# Patient Record
Sex: Male | Born: 1940 | Race: White | Hispanic: No | Marital: Married | Smoking: Former smoker
Health system: Southern US, Community
[De-identification: ages and names within clinical notes are randomized; demographics above are authoritative.]

## PROBLEM LIST (undated history)

## (undated) DIAGNOSIS — I10 Essential (primary) hypertension: Secondary | ICD-10-CM

## (undated) DIAGNOSIS — J449 Chronic obstructive pulmonary disease, unspecified: Secondary | ICD-10-CM

## (undated) DIAGNOSIS — I480 Paroxysmal atrial fibrillation: Secondary | ICD-10-CM

## (undated) DIAGNOSIS — I739 Peripheral vascular disease, unspecified: Secondary | ICD-10-CM

## (undated) DIAGNOSIS — N189 Chronic kidney disease, unspecified: Secondary | ICD-10-CM

## (undated) DIAGNOSIS — Z860100 Personal history of colon polyps, unspecified: Secondary | ICD-10-CM

## (undated) DIAGNOSIS — M4800 Spinal stenosis, site unspecified: Secondary | ICD-10-CM

## (undated) DIAGNOSIS — F419 Anxiety disorder, unspecified: Secondary | ICD-10-CM

## (undated) DIAGNOSIS — F32A Depression, unspecified: Secondary | ICD-10-CM

## (undated) DIAGNOSIS — G629 Polyneuropathy, unspecified: Secondary | ICD-10-CM

## (undated) DIAGNOSIS — I499 Cardiac arrhythmia, unspecified: Secondary | ICD-10-CM

## (undated) DIAGNOSIS — E785 Hyperlipidemia, unspecified: Secondary | ICD-10-CM

## (undated) DIAGNOSIS — G4730 Sleep apnea, unspecified: Secondary | ICD-10-CM

## (undated) DIAGNOSIS — I6529 Occlusion and stenosis of unspecified carotid artery: Secondary | ICD-10-CM

## (undated) DIAGNOSIS — I639 Cerebral infarction, unspecified: Secondary | ICD-10-CM

## (undated) DIAGNOSIS — F329 Major depressive disorder, single episode, unspecified: Secondary | ICD-10-CM

## (undated) DIAGNOSIS — R351 Nocturia: Secondary | ICD-10-CM

## (undated) HISTORY — PX: TONSILLECTOMY: SUR1361

## (undated) HISTORY — PX: EYE SURGERY: SHX253

## (undated) HISTORY — DX: Sleep apnea, unspecified: G47.30

## (undated) HISTORY — DX: Occlusion and stenosis of unspecified carotid artery: I65.29

---

## 1999-05-03 ENCOUNTER — Ambulatory Visit (HOSPITAL_COMMUNITY): Admission: RE | Admit: 1999-05-03 | Discharge: 1999-05-03 | Payer: Self-pay | Admitting: Gastroenterology

## 2002-03-28 HISTORY — PX: HAMMER TOE SURGERY: SHX385

## 2004-03-28 HISTORY — PX: HERNIA REPAIR: SHX51

## 2004-05-17 ENCOUNTER — Ambulatory Visit (HOSPITAL_COMMUNITY): Admission: RE | Admit: 2004-05-17 | Discharge: 2004-05-17 | Payer: Self-pay | Admitting: Gastroenterology

## 2008-03-28 HISTORY — PX: SHOULDER ARTHROSCOPY W/ ROTATOR CUFF REPAIR: SHX2400

## 2011-05-01 DIAGNOSIS — I639 Cerebral infarction, unspecified: Secondary | ICD-10-CM

## 2011-05-01 HISTORY — DX: Cerebral infarction, unspecified: I63.9

## 2011-10-21 ENCOUNTER — Other Ambulatory Visit: Payer: Self-pay | Admitting: Gastroenterology

## 2011-11-29 ENCOUNTER — Inpatient Hospital Stay (HOSPITAL_COMMUNITY): Payer: Medicare Other

## 2011-11-29 ENCOUNTER — Encounter (HOSPITAL_COMMUNITY): Payer: Self-pay | Admitting: Emergency Medicine

## 2011-11-29 ENCOUNTER — Inpatient Hospital Stay (HOSPITAL_COMMUNITY)
Admission: EM | Admit: 2011-11-29 | Discharge: 2011-12-01 | DRG: 065 | Disposition: A | Discharge status: Home / Self Care | Payer: Medicare Other | Attending: Internal Medicine | Admitting: Internal Medicine

## 2011-11-29 DIAGNOSIS — Z7982 Long term (current) use of aspirin: Secondary | ICD-10-CM

## 2011-11-29 DIAGNOSIS — F1010 Alcohol abuse, uncomplicated: Secondary | ICD-10-CM | POA: Diagnosis present

## 2011-11-29 DIAGNOSIS — I63239 Cerebral infarction due to unspecified occlusion or stenosis of unspecified carotid arteries: Principal | ICD-10-CM | POA: Diagnosis present

## 2011-11-29 DIAGNOSIS — I639 Cerebral infarction, unspecified: Secondary | ICD-10-CM | POA: Diagnosis present

## 2011-11-29 DIAGNOSIS — I10 Essential (primary) hypertension: Secondary | ICD-10-CM | POA: Diagnosis present

## 2011-11-29 DIAGNOSIS — I6529 Occlusion and stenosis of unspecified carotid artery: Secondary | ICD-10-CM | POA: Diagnosis present

## 2011-11-29 DIAGNOSIS — F1090 Alcohol use, unspecified, uncomplicated: Secondary | ICD-10-CM | POA: Diagnosis present

## 2011-11-29 DIAGNOSIS — I6350 Cerebral infarction due to unspecified occlusion or stenosis of unspecified cerebral artery: Secondary | ICD-10-CM

## 2011-11-29 DIAGNOSIS — Z79899 Other long term (current) drug therapy: Secondary | ICD-10-CM

## 2011-11-29 DIAGNOSIS — E782 Mixed hyperlipidemia: Secondary | ICD-10-CM | POA: Diagnosis present

## 2011-11-29 DIAGNOSIS — E785 Hyperlipidemia, unspecified: Secondary | ICD-10-CM

## 2011-11-29 DIAGNOSIS — Z7289 Other problems related to lifestyle: Secondary | ICD-10-CM

## 2011-11-29 HISTORY — DX: Hyperlipidemia, unspecified: E78.5

## 2011-11-29 LAB — CBC WITH DIFFERENTIAL/PLATELET
Basophils Relative: 1 % (ref 0–1)
Hemoglobin: 16.9 g/dL (ref 13.0–17.0)
MCHC: 36 g/dL (ref 30.0–36.0)
Monocytes Relative: 8 % (ref 3–12)
Neutro Abs: 4.9 10*3/uL (ref 1.7–7.7)
Neutrophils Relative %: 63 % (ref 43–77)
RBC: 5.27 MIL/uL (ref 4.22–5.81)
WBC: 7.8 10*3/uL (ref 4.0–10.5)

## 2011-11-29 LAB — URINALYSIS, ROUTINE W REFLEX MICROSCOPIC
Ketones, ur: NEGATIVE mg/dL
Leukocytes, UA: NEGATIVE
Nitrite: NEGATIVE
Specific Gravity, Urine: 1.027 (ref 1.005–1.030)
pH: 5.5 (ref 5.0–8.0)

## 2011-11-29 LAB — COMPREHENSIVE METABOLIC PANEL
AST: 22 U/L (ref 0–37)
Albumin: 4.2 g/dL (ref 3.5–5.2)
Alkaline Phosphatase: 68 U/L (ref 39–117)
BUN: 22 mg/dL (ref 6–23)
Chloride: 103 mEq/L (ref 96–112)
Potassium: 3.8 mEq/L (ref 3.5–5.1)
Total Bilirubin: 0.4 mg/dL (ref 0.3–1.2)

## 2011-11-29 LAB — APTT: aPTT: 29 seconds (ref 24–37)

## 2011-11-29 LAB — TROPONIN I: Troponin I: <0.3 ng/mL (ref <0.30)

## 2011-11-29 LAB — PROTIME-INR
INR: 0.99 (ref 0.00–1.49)
Prothrombin Time: 13.3 seconds (ref 11.6–15.2)

## 2011-11-29 IMAGING — CT CT ANGIO HEAD
1 of 11 series · 4 of 33 positions shown · IV contrast (omnipaque)
Comparison: None.

CLINICAL DATA: Left eye and facial weakness.  Some slurred speech.
Hyperlipidemia.

CT ANGIOGRAPHY HEAD
TECHNIQUE: Multidetector CT imaging of the head was performed
using the standard protocol during bolus administration of
intravenous contrast.  Multiplanar CT image reconstructions
including MIPs were obtained to evaluate the vascular anatomy.
Contrast: 50mL OMNIPAQUE IOHEXOL 350 MG/ML SOLN

[mpr, ax 1x1 mpr, axial · axial · 0.48mm/px · z∈[+338,+431]mm · 4 of 156 slices shown]
[im 32/156  soft-tissue]
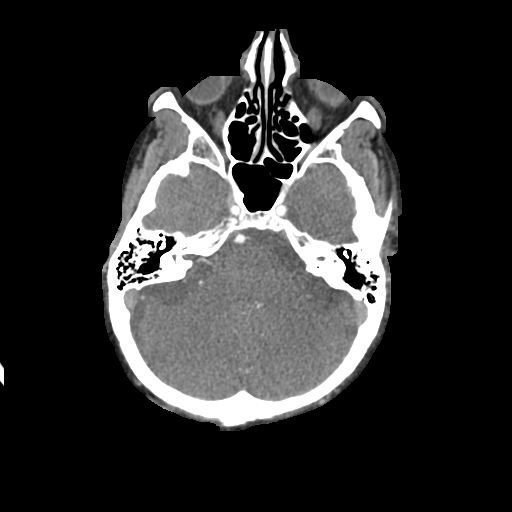
[im 63/156  bone]
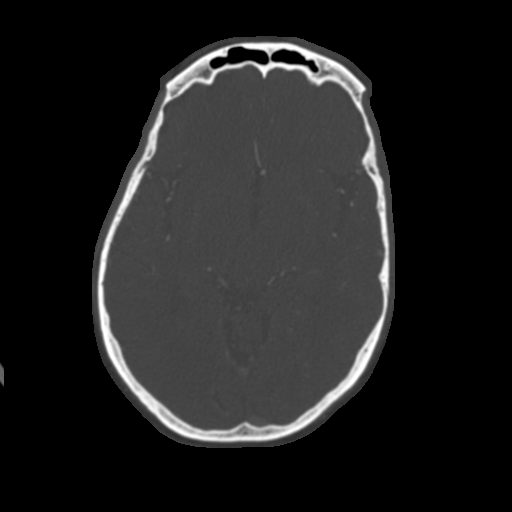
[im 94/156  soft-tissue]
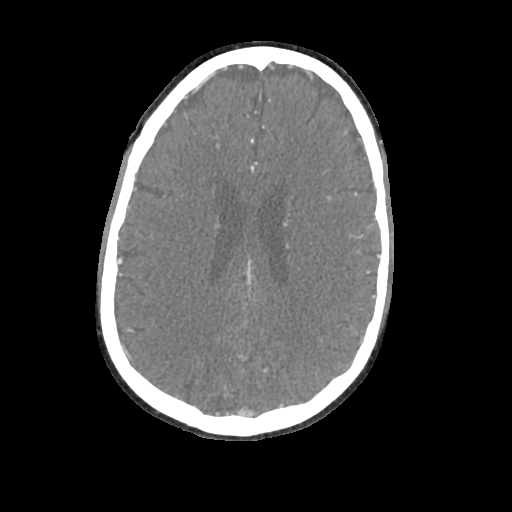
[im 125/156  bone]
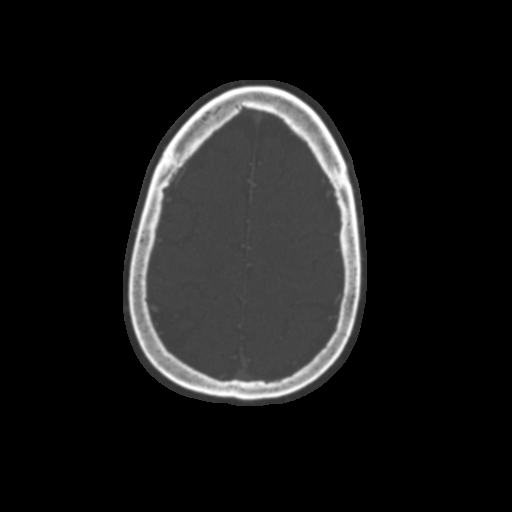

[4 of 33 positions shown; findings below may reference images not displayed]

FINDINGS: No intracranial hemorrhage.

Mild small vessel disease type changes without CT evidence of large
acute thrombotic infarct.

Right frontal developmental venous anomaly otherwise no evidence of
abnormal intracranial enhancing lesion.

No hydrocephalus.

Very mild exophthalmos.

Ectatic vertebral arteries. Mild narrowing of the right vertebral
artery with slight ectasia of the left vertebral artery.

Prominent basilar tip without aneurysm.  Mild irregularity of the
distal basilar artery without high-grade stenosis.

Ectatic distal vertical cervical segment of the carotid arteries
bilaterally.  Calcified plaque cavernous segment of the internal
carotid artery bilaterally with mild to slightly moderate regions
of narrowing and irregularity.

Middle cerebral artery branch vessel irregularity bilaterally.

 Review of the MIP images confirms the above findings.
IMPRESSION: Mild small vessel disease type changes without CT evidence of large
acute thrombotic infarct.

Right frontal developmental venous anomaly otherwise no evidence of
abnormal intracranial enhancing lesion.

Very mild exophthalmos.

Ectatic vertebral arteries. Mild narrowing of the right vertebral
artery with slight ectasia of the left vertebral artery.

Prominent basilar tip without aneurysm.  Mild irregularity of the
distal basilar artery without high-grade stenosis.

Ectatic distal vertical cervical segment of the carotid arteries
bilaterally.  Calcified plaque cavernous segment of the internal
carotid artery bilaterally with mild to slightly moderate regions
of narrowing and irregularity.

Middle cerebral artery branch vessel irregularity bilaterally.]

## 2011-11-29 IMAGING — CR DG CHEST 2V
2 series · 2 of 2 positions shown · non-contrast
Comparison: None

CLINICAL DATA: Weakness, stroke

CHEST - 2 VIEW

[w chest pa]
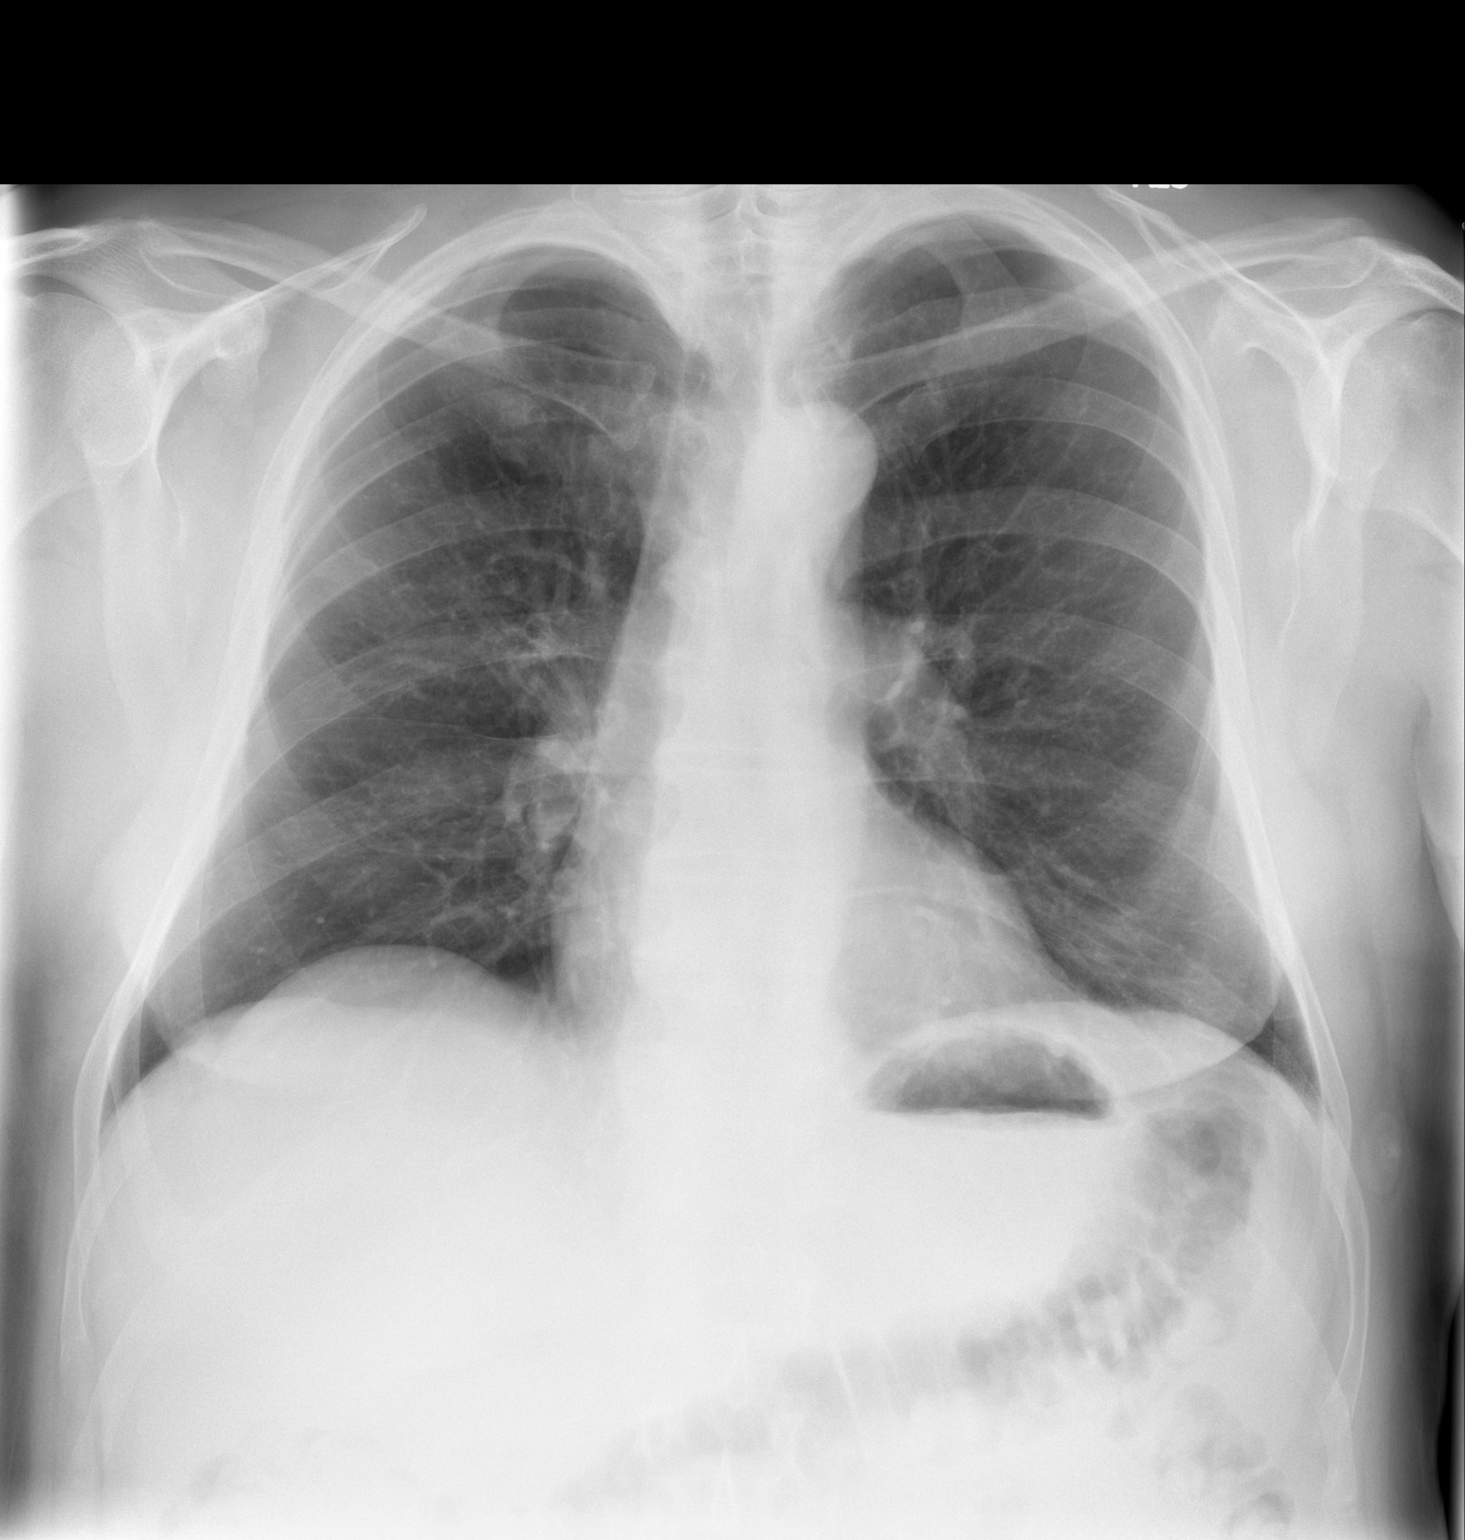

[w chest lat]
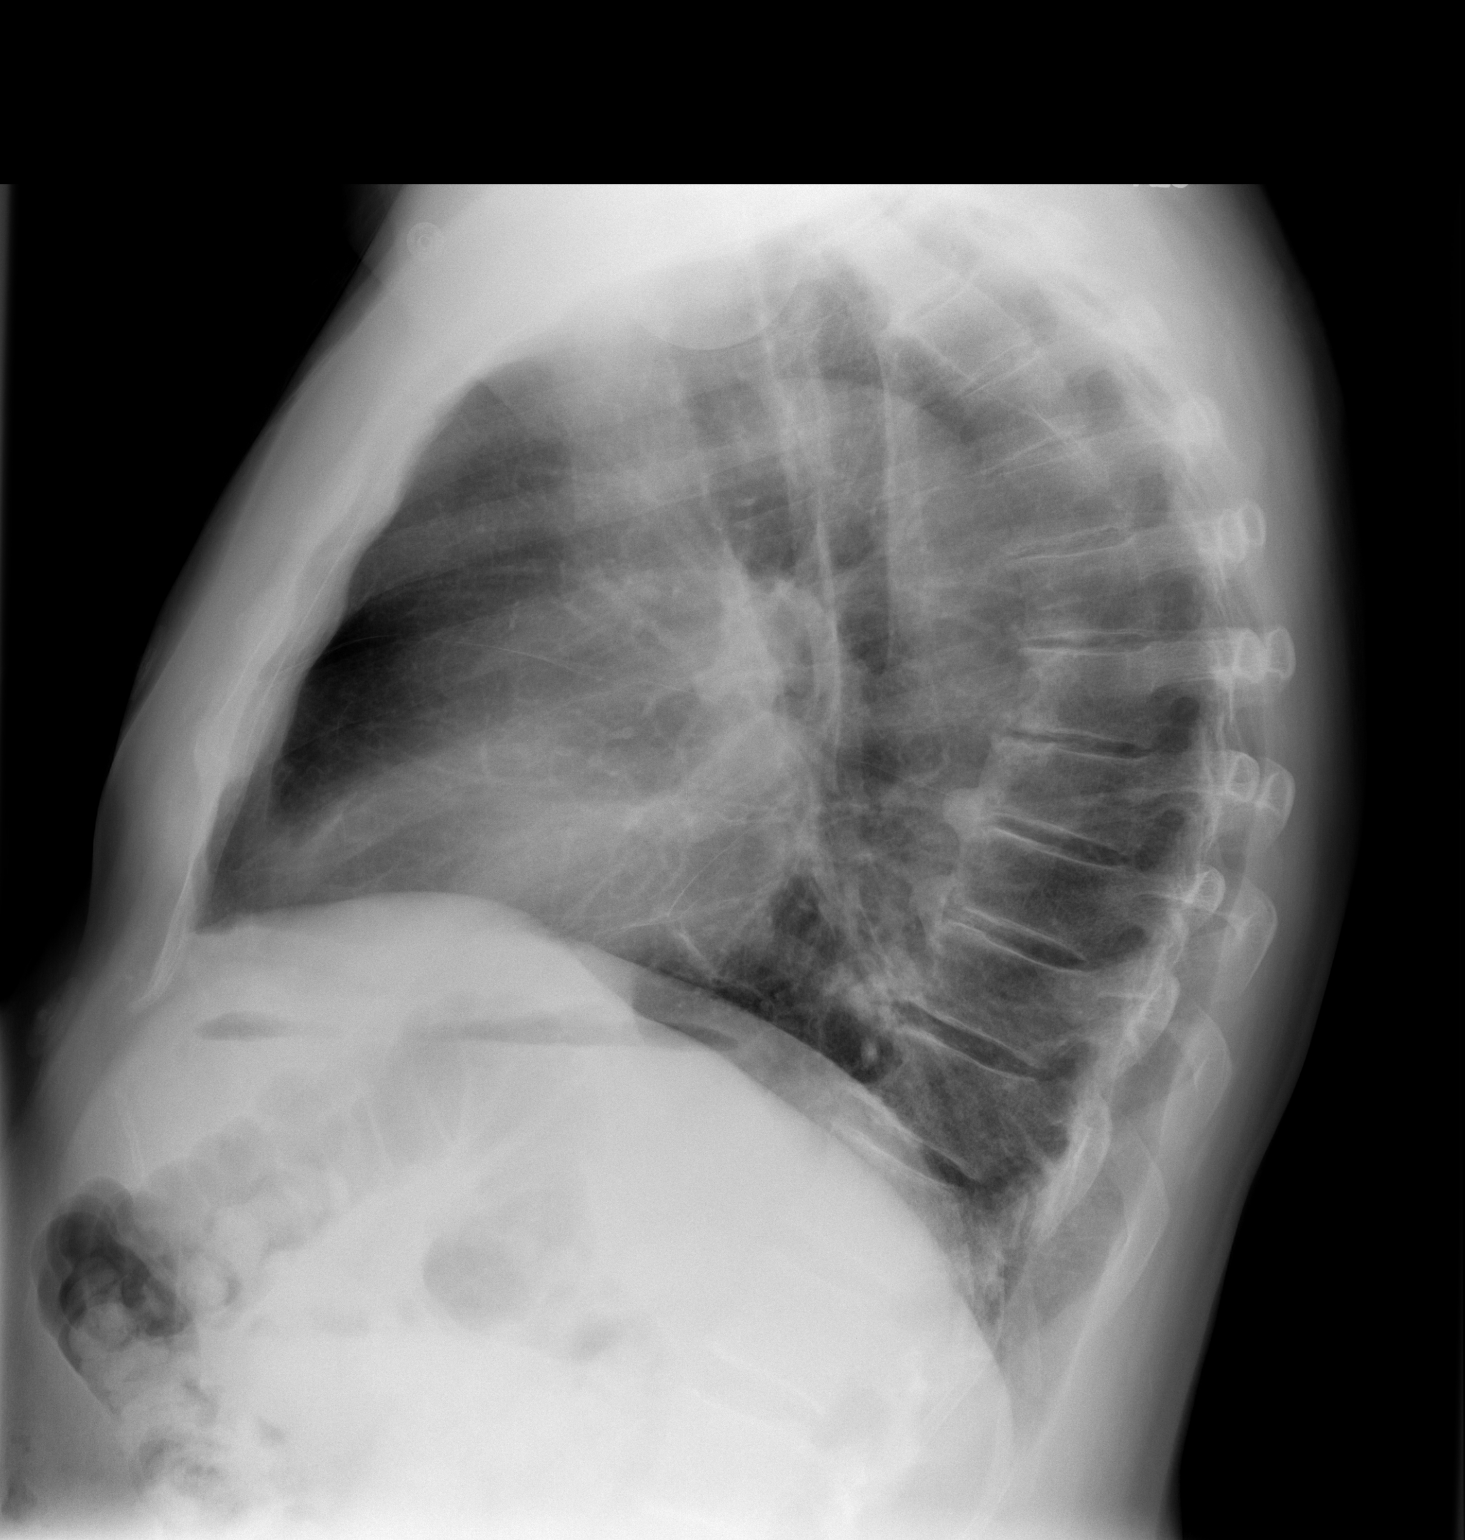

[2 of 2 positions shown; findings below may reference images not displayed]

FINDINGS: Normal heart size, mediastinal contours, and pulmonary vascularity.
Peribronchial thickening and mild emphysematous changes.
Minimal left basilar atelectasis.
No pulmonary infiltrate, pleural effusion, or pneumothorax.
Multilevel endplate spur formation thoracic spine.
IMPRESSION: Mild emphysematous bronchitic changes with minimal left basilar
atelectasis.

## 2011-11-29 MED ORDER — SIMVASTATIN 20 MG PO TABS
20.0000 mg | ORAL_TABLET | Freq: Every evening | ORAL | Status: DC
  Administered 2011-11-29 – 2011-11-30 (×2): 20 mg via ORAL
  Filled 2011-11-29 (×3): qty 1

## 2011-11-29 MED ORDER — ASPIRIN 325 MG PO TABS
325.0000 mg | ORAL_TABLET | Freq: Once | ORAL | Status: AC
  Administered 2011-11-29: 325 mg via ORAL
  Filled 2011-11-29: qty 1

## 2011-11-29 MED ORDER — CLOPIDOGREL BISULFATE 75 MG PO TABS
75.0000 mg | ORAL_TABLET | Freq: Every day | ORAL | Status: DC
  Administered 2011-11-30 – 2011-12-01 (×2): 75 mg via ORAL
  Filled 2011-11-29 (×3): qty 1

## 2011-11-29 MED ORDER — LORAZEPAM 1 MG PO TABS
1.0000 mg | ORAL_TABLET | ORAL | Status: DC | PRN
  Administered 2011-11-29: 1 mg via ORAL
  Filled 2011-11-29: qty 1

## 2011-11-29 MED ORDER — SODIUM CHLORIDE 0.9 % IV SOLN
INTRAVENOUS | Status: DC
  Administered 2011-11-29: 23:00:00 via INTRAVENOUS

## 2011-11-29 MED ORDER — ENOXAPARIN SODIUM 40 MG/0.4ML ~~LOC~~ SOLN
40.0000 mg | SUBCUTANEOUS | Status: DC
  Administered 2011-11-29 – 2011-11-30 (×2): 40 mg via SUBCUTANEOUS
  Filled 2011-11-29 (×3): qty 0.4

## 2011-11-29 MED ORDER — IOHEXOL 350 MG/ML SOLN
50.0000 mL | Freq: Once | INTRAVENOUS | Status: AC | PRN
  Administered 2011-11-29: 50 mL via INTRAVENOUS

## 2011-11-29 MED ORDER — ASPIRIN EC 81 MG PO TBEC
81.0000 mg | DELAYED_RELEASE_TABLET | Freq: Every day | ORAL | Status: DC
  Administered 2011-11-30 – 2011-12-01 (×2): 81 mg via ORAL
  Filled 2011-11-29 (×3): qty 1

## 2011-11-29 MED ORDER — SENNOSIDES-DOCUSATE SODIUM 8.6-50 MG PO TABS
1.0000 | ORAL_TABLET | Freq: Every evening | ORAL | Status: DC | PRN
  Administered 2011-11-30: 1 via ORAL
  Filled 2011-11-29: qty 1

## 2011-11-29 NOTE — H&P (Signed)
[facility] History and Physical  Brandon Mendoza BJY:[id number] DOB: 09-Apr-1940 DOA: 11/29/2011   PCP: Brandon Senegal, MD   Chief Complaint:  Chief Complaint  Patient presents with  . Weakness     HPI: Brandon Mendoza is a 71 y.o. male who noticed today that he was having some difficulty speaking when he woke up. He then proceeded to go to a racket tennis game with his friend and was able to complete again without difficulty. He returned back home and his wife convinced him to go to his primary care physician. He was referred for an outpatient MRI which showed an acute stroke and the patient was brought to the emergency room. He has no major appreciable neurological deficits currently.   Review of Systems: The patient denies anorexia, fever, weight loss,, vision loss, decreased hearing, hoarseness, chest pain, syncope, dyspnea on exertion, peripheral edema, balance deficits, hemoptysis, abdominal pain, melena, hematochezia, severe indigestion/heartburn, hematuria, incontinence, genital sores, muscle weakness, suspicious skin lesions, transient blindness, difficulty walking, depression, unusual weight change, abnormal bleeding, enlarged lymph nodes, angioedema, and breast masses.    Past Medical History  Diagnosis Date  . Hyperlipidemia    No past surgical history on file. Social History:  reports that he has never smoked. He does not have any smokeless tobacco history on file. He reports that he drinks alcohol. His drug history not on file. Patient drinks about 6 ounces of gin or vodka each night He is retired  No Known Allergies  fhx - positive for HTn  Prior to Admission medications   Medication Sig Start Date End Date Taking? Authorizing Provider  ALPRAZolam Prudy Feeler) 0.5 MG tablet Take 0.5 mg by mouth daily as needed. For anxiety   Yes Historical Provider, MD  Ascorbic Acid (VITAMIN C PO) Take 2 tablets by mouth daily.   Yes Historical Provider, MD  aspirin EC 81 MG tablet  Take 81 mg by mouth daily.   Yes Historical Provider, MD  Misc Natural Products (GLUCOSAMINE CHONDROITIN ADV PO) Take 1 tablet by mouth daily.   Yes Historical Provider, MD  Multiple Vitamins-Minerals (MULTIVITAMINS THER. W/MINERALS) TABS Take 1 tablet by mouth daily.   Yes Historical Provider, MD  Omega-3 Fatty Acids (FISH OIL PO) Take 1 capsule by mouth daily.   Yes Historical Provider, MD  simvastatin (ZOCOR) 40 MG tablet Take 20 mg by mouth every evening.   Yes Historical Provider, MD   Physical Exam: Filed Vitals:   11/29/11 1457 11/29/11 1558 11/29/11 1704  BP: 157/71 157/77   Pulse: 95 76   Temp: 97.8 F (36.6 C)  98.5 F (36.9 C)  Resp: 16 16   SpO2: 99% 96%      General:  Alert and oriented x3  Eyes: Pupil equal round react to light accommodation, extraocular was intact  ENT: No pharyngeal erythema  Neck: No JVD  Cardiovascular: Regular rate and rhythm without murmurs rubs or gallops  Respiratory: Clear to auscultation bilaterally  Abdomen: Soft nontender  Skin: Erythema of the face and arms  Musculoskeletal: No significant deformities  Psychiatric: Expansive mood  Neurologic: Cranial nerves 2-12 intact, strength 5 out of 5 in all 4 extremities, sensation intact  Labs on Admission:  Basic Metabolic Panel:  Lab 11/29/11 0865  NA 140  K 3.8  CL 103  CO2 25  GLUCOSE 135*  BUN 22  CREATININE 1.24  CALCIUM 9.7  MG --  PHOS --   Liver Function Tests:  Lab 11/29/11 1519  AST 22  ALT 16  ALKPHOS 68  BILITOT 0.4  PROT 7.3  ALBUMIN 4.2   No results found for this basename: LIPASE:5,AMYLASE:5 in the last 168 hours No results found for this basename: AMMONIA:5 in the last 168 hours CBC:  Lab 11/29/11 1519  WBC 7.8  NEUTROABS 4.9  HGB 16.9  HCT 47.0  MCV 89.2  PLT 239   Cardiac Enzymes:  Lab 11/29/11 1717  CKTOTAL --  CKMB --  CKMBINDEX --  TROPONINI <0.30    BNP (last 3 results) No results found for this basename: PROBNP:3 in the  last 8760 hours CBG: No results found for this basename: GLUCAP:5 in the last 168 hours  Radiological Exams on Admission: No results found.   Assessment/Plan Active Problems:  CVA (cerebral infarction)  Hyperlipidemia   1. CVA - stroke workup initiated in the emergency room. Neurology consultation obtained. Start on aspirin, continue statin.  2. Alcohol use-patient has no intention of giving up.  Will provide ativan prn   Code Status: full Family Communication: son Disposition Plan: home  Time spent: 30 minutes  Brandon Mendoza [facility] Pager [phone]  If 7PM-7AM, please contact night-coverage [email or web address] Password [email or web address] 11/29/2011, 7:10 PM

## 2011-11-29 NOTE — ED Provider Notes (Signed)
History     CSN: [id number]  Arrival date & time 11/29/11  1450   First MD Initiated Contact with Patient 11/29/11 1536      Chief Complaint  Patient presents with  . Weakness    (Consider location/radiation/quality/duration/timing/severity/associated sxs/prior treatment) The history is provided by the patient.    71 y.o. male  in no acute distress sent by primary care after MRI shows infarction. Patient noticed that he had dysarthria onset yesterday evening at about 8 PM. He states it is constant in severity. He denies any focal weakness change in vision, palpitations, N/V. He is a nonsmoker with past medical history significant only for hyperlipidemia.   History reviewed. No pertinent past medical history.  No past surgical history on file.  No family history on file.  History  Substance Use Topics  . Smoking status: Never Smoker   . Smokeless tobacco: Not on file  . Alcohol Use: Yes      Review of Systems  Constitutional: Negative for fever.  Respiratory: Negative for shortness of breath.   Cardiovascular: Negative for chest pain.  Gastrointestinal: Negative for nausea, vomiting, abdominal pain and diarrhea.  Neurological: Positive for speech difficulty. Negative for facial asymmetry, weakness, numbness and headaches.  All other systems reviewed and are negative.    Allergies  Review of patient's allergies indicates no known allergies.  Home Medications   Current Outpatient Rx  Name Route Sig Dispense Refill  . ALPRAZOLAM 0.5 MG PO TABS Oral Take 0.5 mg by mouth daily as needed. For anxiety    . VITAMIN C PO Oral Take 2 tablets by mouth daily.    . ASPIRIN EC 81 MG PO TBEC Oral Take 81 mg by mouth daily.    Marland Kitchen GLUCOSAMINE CHONDROITIN ADV PO Oral Take 1 tablet by mouth daily.    Carma Leaven M PLUS PO TABS Oral Take 1 tablet by mouth daily.    Marland Kitchen FISH OIL PO Oral Take 1 capsule by mouth daily.    Marland Kitchen SIMVASTATIN 40 MG PO TABS Oral Take 20 mg by mouth every evening.       BP 157/71  Pulse 95  Temp 97.8 F (36.6 C)  Resp 16  SpO2 99%  Physical Exam  Nursing note and vitals reviewed. Constitutional: He is oriented to person, place, and time. He appears well-developed and well-nourished. No distress.  HENT:  Head: Normocephalic and atraumatic.  Eyes: Conjunctivae and EOM are normal. Pupils are equal, round, and reactive to light.  Neck: Normal range of motion. Neck supple.  Cardiovascular: Normal rate, regular rhythm and normal heart sounds.   Pulmonary/Chest: Effort normal and breath sounds normal. No respiratory distress. He has no wheezes. He has no rales. He exhibits no tenderness.  Abdominal: Soft. Bowel sounds are normal.  Musculoskeletal: Normal range of motion.  Neurological: He is alert and oriented to person, place, and time.       No facial droop. Speech ?subtle slurring. Cranial nerves III through XII intact, strength 5 out of 5x4 extremities, negative pronator drift, finger to nose and heel-to-shin coordinated, sensation intact to pinprick and light touch, gait is coordinated and Romberg is negative.   Psychiatric: He has a normal mood and affect.    ED Course  Procedures (including critical care time)  Labs Reviewed  COMPREHENSIVE METABOLIC PANEL - Abnormal; Notable for the following:    Glucose, Bld 135 (*)     GFR calc non Af Amer 56 (*)     GFR calc  Af Amer 65 (*)     All other components within normal limits  PROTIME-INR  CBC WITH DIFFERENTIAL  APTT  URINALYSIS, ROUTINE W REFLEX MICROSCOPIC  TROPONIN I   No results found.   Date: 11/29/2011  Rate: 90  Rhythm: Sinus arrythmia   QRS Axis: normal  Intervals: normal  ST/T Wave abnormalities: normal  Conduction Disutrbances:none  Narrative Interpretation:   Old EKG Reviewed: none available   1. CVA (cerebral infarction)       MDM  71 year old male with past medical history significant for hyperlipidemia presenting with subtle dysarthria onset at about 8 PM last  night. Patient was seen by his PCP Dr. Vernell Morgans of [facility] in [facility] who sent him for an MRI at [facility] in [facility].  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  Last seen normal 20 hours ago patient is not a candidate for thrombolytics  She will be admitted to a telemetry bed under the care of Dr.Laza [facility] [address], [place] 11/29/11 1824

## 2011-11-29 NOTE — Consult Note (Addendum)
Referring Physician: Lavera Guise    Chief Complaint: Difficulty with speech  HPI: Brandon Mendoza is an 71 y.o. male who reports that last evening he noticed that his tongue felt fat and he was speaking a little funny.  Today when he went to play racquetball the other players noted that his speech was slurred as well.  When he returned home his wife encouraged him to seek medical attention.  He want to [facility] and a MRI of the brain was performed that reportedly showed a left posterior frontal, anterior parietal acute infarct.  Patient was referred here for further evaluation.    LSN: [id number] 11/28/2011 tPA Given: No: Outside time window  Past Medical History  Diagnosis Date  . Hyperlipidemia     Past surgical history: hernia repair, hammer toe repair bilaterally  No family history on file.  Social History: No history of tobacco abuse.  Reports he drinks three martinis and a beer daily.  No history of illicit drug abuse.    Allergies: No Known Allergies  Medications: I have reviewed the patient's current medications. Prior to Admission:  Current outpatient prescriptions:ALPRAZolam (XANAX) 0.5 MG tablet, Take 0.5 mg by mouth daily as needed. For anxiety, Disp: , Rfl: ;  Ascorbic Acid (VITAMIN C PO), Take 2 tablets by mouth daily., Disp: , Rfl: ;  aspirin EC 81 MG tablet, Take 81 mg by mouth daily., Disp: , Rfl: ;  Misc Natural Products (GLUCOSAMINE CHONDROITIN ADV PO), Take 1 tablet by mouth daily., Disp: , Rfl:  Multiple Vitamins-Minerals (MULTIVITAMINS THER. W/MINERALS) TABS, Take 1 tablet by mouth daily., Disp: , Rfl: ;  Omega-3 Fatty Acids (FISH OIL PO), Take 1 capsule by mouth daily., Disp: , Rfl: ;  simvastatin (ZOCOR) 40 MG tablet, Take 20 mg by mouth every evening., Disp: , Rfl:   ROS: History obtained from the patient  General ROS: negative for - chills, fatigue, fever, night sweats, weight gain or weight loss Psychological ROS: negative for - behavioral disorder, hallucinations, memory  difficulties, mood swings or suicidal ideation Ophthalmic ROS: negative for - blurry vision, double vision, eye pain or loss of vision ENT ROS: negative for - epistaxis, nasal discharge, oral lesions, sore throat, tinnitus or vertigo Allergy and Immunology ROS: negative for - hives or itchy/watery eyes Hematological and Lymphatic ROS: negative for - bleeding problems, bruising or swollen lymph nodes Endocrine ROS: negative for - galactorrhea, hair pattern changes, polydipsia/polyuria or temperature intolerance Respiratory ROS: negative for - cough, hemoptysis, shortness of breath or wheezing Cardiovascular ROS: negative for - chest pain, dyspnea on exertion, edema or irregular heartbeat Gastrointestinal ROS: negative for - abdominal pain, diarrhea, hematemesis, nausea/vomiting or stool incontinence Genito-Urinary ROS: negative for - dysuria, hematuria, incontinence or urinary frequency/urgency Musculoskeletal ROS: negative for - joint swelling or muscular weakness Neurological ROS: as noted in HPI Dermatological ROS: negative for rash and skin lesion changes  Physical Examination: Blood pressure 157/77, pulse 76, temperature 98.5 F (36.9 C), resp. rate 16, SpO2 96.00%.  Neurologic Examination: Mental Status: Alert, oriented, thought content appropriate.  Speech fluent without evidence of aphasia.  Some occasional word finding difficulties noted.  Mild slurred appreciated.  Able to follow 3 step commands without difficulty. Cranial Nerves: II: Discs flat bilaterally; Visual fields grossly normal, pupils equal, round, reactive to light and accommodation III,IV, VI: ptosis not present, extra-ocular motions intact bilaterally V,VII: mild right facial droop, facial light touch sensation normal bilaterally VIII: hearing normal bilaterally IX,X: gag reflex present XI: bilateral shoulder shrug XII: midline  tongue extension Motor: Right : Upper extremity   5/5    Left:     Upper extremity    5/5  Lower extremity   5/5     Lower extremity   5/5 Tone and bulk:normal tone throughout; no atrophy noted Sensory: Pinprick and light touch intact throughout, bilaterally Deep Tendon Reflexes: 2+ throughout with absent AJ's bilaterally Plantars: Right: upgoing   Left: equivocal Cerebellar: Normal finger-to-nose and normal rapid alternating movements.  Heel-to-shin testing with mild dysmetria bilaterally   Laboratory Studies:  Basic Metabolic Panel:  Lab 11/29/11 1610  NA 140  K 3.8  CL 103  CO2 25  GLUCOSE 135*  BUN 22  CREATININE 1.24  CALCIUM 9.7  MG --  PHOS --    Liver Function Tests:  Lab 11/29/11 1519  AST 22  ALT 16  ALKPHOS 68  BILITOT 0.4  PROT 7.3  ALBUMIN 4.2   No results found for this basename: LIPASE:5,AMYLASE:5 in the last 168 hours No results found for this basename: AMMONIA:3 in the last 168 hours  CBC:  Lab 11/29/11 1519  WBC 7.8  NEUTROABS 4.9  HGB 16.9  HCT 47.0  MCV 89.2  PLT 239    Cardiac Enzymes:  Lab 11/29/11 1717  CKTOTAL --  CKMB --  CKMBINDEX --  TROPONINI <0.30    BNP: No components found with this basename: POCBNP:5  CBG: No results found for this basename: GLUCAP:5 in the last 168 hours  Microbiology: No results found for this or any previous visit.  Coagulation Studies:  Basename 11/29/11 1519  LABPROT 13.3  INR 0.99    Urinalysis:  Lab 11/29/11 1746  COLORURINE YELLOW  LABSPEC 1.027  PHURINE 5.5  GLUCOSEU NEGATIVE  HGBUR NEGATIVE  BILIRUBINUR NEGATIVE  KETONESUR NEGATIVE  PROTEINUR NEGATIVE  UROBILINOGEN 0.2  NITRITE NEGATIVE  LEUKOCYTESUR NEGATIVE    Lipid Panel: No results found for this basename: chol, trig, hdl, cholhdl, vldl, ldlcalc    HgbA1C:  No results found for this basename: HGBA1C    Urine Drug Screen:   No results found for this basename: labopia, cocainscrnur, labbenz, amphetmu, thcu, labbarb    Alcohol Level: No results found for this basename: ETH:2 in the last 168  hours  Imaging: No results found.  Assessment: 71 y.o. male who presents with difficulty with speech.  Acute infarct noted on MRI but not available at this time for review.  Patient with risk factor of hypercholesterolemia.   On an aspirin a day at home.    Stroke Risk Factors - hyperlipidemia  Plan: 1. HgbA1c, fasting lipid panel 2. MRI of the brain without contrast to be obtained.   3. Speech consult 4. Echocardiogram, carotid dopplers 5. CTA of the head 6. Prophylactic therapy-Antiplatelet med: Plavix - dose 75mg  daily 7. Risk factor modification 8. Telemetry monitoring 9. Frequent neuro checks   Thana Farr, MD [facility] [phone] 11/29/2011, 7:58 PM

## 2011-11-29 NOTE — ED Notes (Signed)
States last night noted some slurred speech got up this am went to gym and played racquet ball and went home went to dr and was sent to have mri and then was called and told he had a stroke

## 2011-11-29 NOTE — ED Notes (Signed)
Pt brought back to room from triage waiting area; pt undressed, in gown, on monitor,continuous pulse oximetry and blood pressure cuff

## 2011-11-30 DIAGNOSIS — I517 Cardiomegaly: Secondary | ICD-10-CM

## 2011-11-30 DIAGNOSIS — I6529 Occlusion and stenosis of unspecified carotid artery: Secondary | ICD-10-CM

## 2011-11-30 LAB — LIPID PANEL
LDL Cholesterol: 65 mg/dL (ref 0–99)
VLDL: 17 mg/dL (ref 0–40)

## 2011-11-30 NOTE — Progress Notes (Signed)
PT Cancellation Note  Treatment cancelled today due to patient receiving procedure or test. Pt off the floor for CT.  [facility] HELEN 11/30/2011, 9:50 AM Pager: [phone]

## 2011-11-30 NOTE — Evaluation (Signed)
SLP has reviewed and agrees with student's note below.  Chaney Ingram Willis Guilianna Mckoy M.Ed CCC-SLP Pager [phone]  11/30/2011  

## 2011-11-30 NOTE — Care Management Note (Signed)
    Page 1 of 1   11/30/2011     4:50:55 PM   CARE MANAGEMENT NOTE 11/30/2011  Patient:  Brandon Mendoza, Brandon Mendoza   Account Number:  [id number]  Date Initiated:  11/30/2011  Documentation initiated by:  Onnie Boer  Subjective/Objective Assessment:   PT WAS ADMITTED WITH SLURRED SPEECH AND WEAKNESS     Action/Plan:   PROGRESSION OF CARE AND DISCHARGE PLANNING   Anticipated DC Date:  12/01/2011   Anticipated DC Plan:  HOME/SELF CARE      DC Planning Services  CM consult      Choice offered to / List presented to:             Status of service:  In process, will continue to follow Medicare Important Message given?   (If response is "NO", the following Medicare IM given date fields will be blank) Date Medicare IM given:   Date Additional Medicare IM given:    Discharge Disposition:    Per UR Regulation:  Reviewed for med. necessity/level of care/duration of stay  If discussed at Long Length of Stay Meetings, dates discussed:    Comments:  11/30/11 Onnie Boer, RN, BSN 1649 PT WAS ADMITTED WITH SLURRED SPEECH.  PTA PT WAS AT HOME WITH SELF CARE AND PLANS TO RETURN AT DC.  WILL F/U WITH DC NEEDS AND RECOMMENDATIONS.

## 2011-11-30 NOTE — Progress Notes (Signed)
Subjective: Patient is 71 year old male who was seen in the emergency room after he was found to have slurred speech while playing racquetball. He was seen in [facility] an MRI of the brain was performed which showed a left posterior frontal and anterior parietal acute infarct. The patient was referred to the emergency room for further evaluation.  Patient very pleasant and has no complaints. Actually patient states that he feels that he can go home. His speech pattern is back to normal.  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 2-D echocardiogram completed however results are pending at this time. Objective: Filed Vitals:   11/30/11 1034 11/30/11 1130 11/30/11 1132 11/30/11 1429  BP: 205/84 171/69  160/83  Pulse: 57   61  Temp: 97.4 F (36.3 C)   98.4 F (36.9 C)  TempSrc: Oral   Oral  Resp: 20   20  Height:   5\' 9"  (1.753 m)   Weight:   88.769 kg (195 lb 11.2 oz)   SpO2: 100%   98%   Weight change:  No intake or output data in the 24 hours ending 11/30/11 1653  General: Alert, awake, oriented x3, in no acute distress.  HEENT: [place]/AT PEERL, EOMI Heart: Regular rate and rhythm, without murmurs, rubs, gallops.  Lungs: Clear to auscultation. Abdomen: Soft, nontender, nondistended, positive bowel sounds, no masses no hepatosplenomegaly noted. Neuro: No focal neurological deficits noted cranial nerves II through XII grossly intact. Speech is fluent and no appreciable dysphasia noted. DTRs 2+ bilaterally upper and lower extremities. Strength normal 5 in bilateral upper and lower extremities.   Lab Results:  [facility] 11/29/11 1519  NA 140  K 3.8  CL 103  CO2 25  GLUCOSE 135*  BUN 22  CREATININE 1.24  CALCIUM 9.7  MG --  PHOS --    Basename 11/29/11 1519  AST 22  ALT 16  ALKPHOS 68  BILITOT 0.4  PROT 7.3  ALBUMIN 4.2   No results found for this basename: LIPASE:2,AMYLASE:2 in the last 72 hours  Basename 11/29/11  1519  WBC 7.8  NEUTROABS 4.9  HGB 16.9  HCT 47.0  MCV 89.2  PLT 239    Basename 11/29/11 1717  CKTOTAL --  CKMB --  CKMBINDEX --  TROPONINI <0.30   No components found with this basename: POCBNP:3 No results found for this basename: DDIMER:2 in the last 72 hours  Basename 11/30/11 0702  HGBA1C 5.7*    Basename 11/30/11 0702  CHOL 148  HDL 66  LDLCALC 65  TRIG 87  CHOLHDL 2.2  LDLDIRECT --   No results found for this basename: TSH,T4TOTAL,FREET3,T3FREE,THYROIDAB in the last 72 hours No results found for this basename: VITAMINB12:2,FOLATE:2,FERRITIN:2,TIBC:2,IRON:2,RETICCTPCT:2 in the last 72 hours  Micro Results: No results found for this or any previous visit (from the past 240 hour(s)).  Studies/Results: Ct Angio Head W/cm &/or Wo Cm  11/29/2011  *RADIOLOGY REPORT*  Clinical Data:  Left eye and facial weakness.  Some slurred speech. Hyperlipidemia.  CT ANGIOGRAPHY HEAD  Technique:  Multidetector CT imaging of the head was performed using the standard protocol during bolus administration of intravenous contrast.  Multiplanar CT image reconstructions including MIPs were obtained to evaluate the vascular anatomy.  Contrast: 50mL OMNIPAQUE IOHEXOL 350 MG/ML SOLN  Comparison:   None.  Findings:  No intracranial hemorrhage.  Mild small vessel disease type changes without CT evidence of large acute thrombotic infarct.  Right frontal developmental venous anomaly otherwise no evidence  of abnormal intracranial enhancing lesion.  No hydrocephalus.  Very mild exophthalmos.  Ectatic vertebral arteries. Mild narrowing of the right vertebral artery with slight ectasia of the left vertebral artery.  Prominent basilar tip without aneurysm.  Mild irregularity of the distal basilar artery without high-grade stenosis.  Ectatic distal vertical cervical segment of the carotid arteries bilaterally.  Calcified plaque cavernous segment of the internal carotid artery bilaterally with mild to slightly  moderate regions of narrowing and irregularity.  Middle cerebral artery branch vessel irregularity bilaterally.   Review of the MIP images confirms the above findings.  IMPRESSION: Mild small vessel disease type changes without CT evidence of large acute thrombotic infarct.  Right frontal developmental venous anomaly otherwise no evidence of abnormal intracranial enhancing lesion.  Very mild exophthalmos.  Ectatic vertebral arteries. Mild narrowing of the right vertebral artery with slight ectasia of the left vertebral artery.  Prominent basilar tip without aneurysm.  Mild irregularity of the distal basilar artery without high-grade stenosis.  Ectatic distal vertical cervical segment of the carotid arteries bilaterally.  Calcified plaque cavernous segment of the internal carotid artery bilaterally with mild to slightly moderate regions of narrowing and irregularity.  Middle cerebral artery branch vessel irregularity bilaterally.   Original Report Authenticated By: Fuller Canada, M.D.    Dg Chest 2 View  11/29/2011  *RADIOLOGY REPORT*  Clinical Data: Weakness, stroke  CHEST - 2 VIEW  Comparison: None  Findings: Normal heart size, mediastinal contours, and pulmonary vascularity. Peribronchial thickening and mild emphysematous changes. Minimal left basilar atelectasis. No pulmonary infiltrate, pleural effusion, or pneumothorax. Multilevel endplate spur formation thoracic spine.  IMPRESSION: Mild emphysematous bronchitic changes with minimal left basilar atelectasis.   Original Report Authenticated By: Lollie Marrow, M.D.     Medications: I have reviewed the patient's current medications. Scheduled Meds:   . aspirin EC  81 mg Oral Daily  . aspirin  325 mg Oral Once  . clopidogrel  75 mg Oral Q breakfast  . enoxaparin  40 mg Subcutaneous Q24H  . simvastatin  20 mg Oral QPM   Continuous Infusions:   . 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 However thus far carotid duplex shows left ICA stenosis. I placed a consult with vascular surgery to further evaluate the patient for intervention. The patient does have a history of hyperlipidemia and is on a statin which will be continued for independent stroke risk reduction. However at this time LDL well controlled at 65. Plavix have been added to his prehospital dosing of aspirin 81 mg. Patient was seen by physical therapy and speech-language pathology both of whom recommended no further followup at this time    Hyperlipidemia ()   Assessment: Cholesterol well controlled on current medications. Will continue.    Carotid artery stenosis (11/30/2011)   Assessment: Possibly to vascular surgeons for further evaluation and management.   LOS: 1 day

## 2011-11-30 NOTE — Progress Notes (Signed)
  Echocardiogram 2D Echocardiogram has been performed.  Brandon Mendoza 11/30/2011, 10:59 AM

## 2011-11-30 NOTE — Progress Notes (Signed)
OT Cancellation Note  11/30/2011 Cipriano Mile OTR/L Pager [phone] Office [phone]

## 2011-11-30 NOTE — Progress Notes (Signed)
Occupational Therapy Evaluation Patient Details Name: Brandon Mendoza MRN: [id number] DOB: 05-17-1940 Today's Date: 11/30/2011 Time: 1130-1150 OT Time Calculation (min): 20 min  OT Assessment / Plan / Recommendation Clinical Impression  Pt admitted with slurred speech.  MRI findings show left posterior frontal, anterior parietal acute infarct.  Educated pt on stroke signs and symptoms.  Pt is at baseline level with ADLs and functional mobility. No further acute OT needed.    OT Assessment  Patient does not need any further OT services    Follow Up Recommendations  No OT follow up    Barriers to Discharge      Equipment Recommendations  None recommended by PT    Recommendations for Other Services    Frequency       Precautions / Restrictions Precautions Precautions: None   Pertinent Vitals/Pain See vitals    ADL  Lower Body Dressing: Performed;Independent Where Assessed - Lower Body Dressing: Unsupported sit to stand Toilet Transfer: Simulated;Independent Toilet Transfer Method: Sit to Barista:  (bed) Transfers/Ambulation Related to ADLs: independent ADL Comments: Pt at baseline.    OT Diagnosis:    OT Problem List:   OT Treatment Interventions:     OT Goals    Visit Information  Last OT Received On: 11/30/11 Assistance Needed: +1    Subjective Data      Prior Functioning  Vision/Perception  Home Living Lives With: Spouse Available Help at Discharge: Family;Available 24 hours/day Type of Home: House Home Access: Stairs to enter [employer] of Steps: 2 Home Layout: Two level;Able to live on main level with bedroom/bathroom Bathroom Shower/Tub: Engineer, manufacturing systems: Standard Home Adaptive Equipment: None Prior Function Level of Independence: Independent Able to Take Stairs?: Yes Driving: Yes Vocation: Retired      IT consultant  Overall Cognitive Status: Appears within functional limits for tasks  assessed/performed Arousal/Alertness: Awake/alert Orientation Level: Appears intact for tasks assessed Behavior During Session: [facility] for tasks performed Cognition - Other Comments: a bit high strung but agreeable to our evaluation    Extremity/Trunk Assessment Right Upper Extremity Assessment RUE ROM/Strength/Tone: WFL for tasks assessed RUE Sensation: WFL - Light Touch RUE Coordination: WFL - gross/fine motor Left Upper Extremity Assessment LUE ROM/Strength/Tone: WFL for tasks assessed LUE Sensation: WFL - Light Touch LUE Coordination: WFL - gross/fine motor Right Lower Extremity Assessment RLE ROM/Strength/Tone: WFL for tasks assessed RLE Sensation: WFL - Light Touch RLE Coordination: WFL - gross/fine motor Left Lower Extremity Assessment LLE ROM/Strength/Tone: WFL for tasks assessed LLE Sensation: WFL - Light Touch LLE Coordination: WFL - gross/fine motor Trunk Assessment Trunk Assessment: Normal   Mobility  Shoulder Instructions  Bed Mobility Bed Mobility: Supine to Sit;Sit to Supine Supine to Sit: 6: Modified independent (Device/Increase time);HOB elevated (30 degrees) Sit to Supine: 6: Modified independent (Device/Increase time);HOB elevated (30 degrees) Transfers Sit to Stand: 7: Independent;From bed Stand to Sit: 7: Independent;To bed       Exercise     Balance Standardized Balance Assessment Standardized Balance Assessment: Dynamic Gait Index Dynamic Gait Index Level Surface: Normal Change in Gait Speed: Normal Gait with Horizontal Head Turns: Normal Gait with Vertical Head Turns: Normal Gait and Pivot Turn: Normal Step Over Obstacle: Mild Impairment Step Around Obstacles: Normal Steps: Mild Impairment Total Score: 22    End of Session OT - End of Session Equipment Utilized During Treatment: Gait belt Activity Tolerance: Patient tolerated treatment well Patient left: in bed;with call bell/phone within reach;with family/visitor present Nurse  Communication: Mobility  status  GO    11/30/2011 Cipriano Mile OTR/L Pager [phone] Office [phone]  Dyan, [place] 11/30/2011, 3:55 PM

## 2011-11-30 NOTE — Evaluation (Signed)
Speech Language Pathology Evaluation Patient Details Name: Brandon Mendoza MRN: [id number] DOB: [birth year] Today's Date: 11/30/2011 Time: 5621-3086 SLP Time Calculation (min): 16 min  Problem List:  Patient Active Problem List  Diagnosis  . CVA (cerebral infarction)  . Hyperlipidemia   Past Medical History:  Past Medical History  Diagnosis Date  . Hyperlipidemia    Past Surgical History: No past surgical history on file. HPI:  Brandon Mendoza is a 71 y.o. male who noticed today that he was having some difficulty speaking when he woke up. He then proceeded to go to a racket tennis game with his friend and was able to complete again without difficulty. He returned back home and his wife convinced him to go to his primary care physician. He was referred for an outpatient MRI which showed an acute left brain stroke and the patient was brought to the emergency room. He has no major appreciable neurological deficits currently.   Assessment / Plan / Recommendation Clinical Impression  Pt. seen for speech-language-cognitive assessment. He was pleasant, alert, and cooperative.  Orientation, attention, problem solving and receptive/expressive language were WFL's for tasks assessed. Pt. scored within the average range on the memory subtest of Cognistat.  Pt. exhibted a minimal dysarthria that does not reduce his intelligibility (in quiet and face to face setting) and ability to communicate effectively. No ST recommended at this time.     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[employer])   Cognition  Overall Cognitive Status: Appears within functional limits for tasks assessed Arousal/Alertness:  Awake/alert Orientation Level: Oriented X4 Attention: Sustained Sustained Attention: Appears intact Selective Attention: Appears intact Memory: Appears intact Awareness: Appears intact Problem Solving: Appears intact Safety/Judgment: Appears intact    Comprehension  Auditory Comprehension Overall Auditory Comprehension: Appears within functional limits for tasks assessed Yes/No Questions: Within Functional Limits Commands: Within Functional Limits Conversation: Simple Interfering Components:  (Short term memory) Visual Recognition/Discrimination Discrimination: Not tested Reading Comprehension Reading Status: Not tested    Expression Expression Primary Mode of Expression: Verbal Verbal Expression Overall Verbal Expression: Appears within functional limits for tasks assessed Initiation: No impairment Automatic Speech:  (no impairement seen for tasks assessed ) Level of Generative/Spontaneous Verbalization: Conversation Repetition: No impairment Naming: No impairment Pragmatics: No impairment Non-Verbal Means of Communication: Not applicable Written Expression Written Expression: Not tested   Oral / Motor Oral Motor/Sensory Function Labial ROM: Within Functional Limits Labial Symmetry: Within Functional Limits Labial Strength: Within Functional Limits Lingual ROM: Within Functional Limits Lingual Symmetry: Within Functional Limits Lingual Strength: Within Functional Limits Facial ROM: Within Functional Limits Facial Symmetry: Within Functional Limits Facial Strength: Within Functional Limits Facial Sensation: Within Functional Limits Velum: Within Functional Limits Mandible: Within Functional Limits Motor Speech Overall Motor Speech: Appears within functional limits for tasks assessed Respiration: Within functional limits Phonation: Normal Resonance: Within functional limits Articulation: Within functional limitis Intelligibility: Intelligibility reduced Word:  75-100% accurate Phrase: 75-100% accurate Sentence: 75-100% accurate Conversation: 75-100% accurate Motor Planning: Witnin functional limits Motor Speech Errors: Not applicable   GO     Brandon Mendoza 11/30/2011, 1:03 PM

## 2011-11-30 NOTE — Progress Notes (Signed)
Stroke Team Progress Note  HISTORY Brandon Mendoza is an 71 y.o. male who reports that last evening 11/28/2011 he noticed that his tongue felt fat and he was speaking a little funny. 11/29/2011 today when he went to play racquetball the other players noted that his speech was slurred as well. When he returned home his wife encouraged him to seek medical attention. He want to [facility] and a MRI of the brain was performed that reportedly showed a left posterior frontal, anterior parietal acute infarct. Patient was referred here for further evaluation.   Patient was not a TPA candidate secondary to delay in arrival. He was admitted for further evaluation and treatment.  SUBJECTIVE His wife and family are at the bedside.  Overall he feels his condition is stable.   OBJECTIVE Most recent Vital Signs: Filed Vitals:   11/29/11 2045 11/30/11 0157 11/30/11 0423 11/30/11 0616  BP:  139/81 150/73 167/82  Pulse: 68 58 59 62  Temp: 97.6 F (36.4 C) 98.1 F (36.7 C) 97.8 F (36.6 C) 97.7 F (36.5 C)  TempSrc: Oral Oral Oral Oral  Resp: 18 18 20 20   SpO2: 95% 97% 97% 98%   IV Fluid Intake:     . sodium chloride 50 mL/hr at 11/29/11 2252   MEDICATIONS    . aspirin EC  81 mg Oral Daily  . aspirin  325 mg Oral Once  . clopidogrel  75 mg Oral Q breakfast  . enoxaparin  40 mg Subcutaneous Q24H  . simvastatin  20 mg Oral QPM   PRN:  iohexol, LORazepam, senna-docusate  Diet:  Cardiac thin liquids Activity:   Bathroom privileges DVT Prophylaxis:  Lovenox 40 mg sq daily   CLINICALLY SIGNIFICANT STUDIES Basic Metabolic Panel:  Lab 11/29/11 4098  NA 140  K 3.8  CL 103  CO2 25  GLUCOSE 135*  BUN 22  CREATININE 1.24  CALCIUM 9.7  MG --  PHOS --   Liver Function Tests:  Lab 11/29/11 1519  AST 22  ALT 16  ALKPHOS 68  BILITOT 0.4  PROT 7.3  ALBUMIN 4.2   CBC:  Lab 11/29/11 1519  WBC 7.8  NEUTROABS 4.9  HGB 16.9  HCT 47.0  MCV 89.2  PLT 239   Coagulation:  Lab 11/29/11 1519  LABPROT  13.3  INR 0.99   Cardiac Enzymes:  Lab 11/29/11 1717  CKTOTAL --  CKMB --  CKMBINDEX --  TROPONINI <0.30   Urinalysis:  Lab 11/29/11 1746  COLORURINE YELLOW  LABSPEC 1.027  PHURINE 5.5  GLUCOSEU NEGATIVE  HGBUR NEGATIVE  BILIRUBINUR NEGATIVE  KETONESUR NEGATIVE  PROTEINUR NEGATIVE  UROBILINOGEN 0.2  NITRITE NEGATIVE  LEUKOCYTESUR NEGATIVE   Lipid Panel    Component Value Date/Time   CHOL 148 11/30/2011 0702   TRIG 87 11/30/2011 0702   HDL 66 11/30/2011 0702   CHOLHDL 2.2 11/30/2011 0702   VLDL 17 11/30/2011 0702   LDLCALC 65 11/30/2011 0702   HgbA1C  No results found for this basename: HGBA1C   Urine Drug Screen:   No results found for this basename: labopia, cocainscrnur, labbenz, amphetmu, thcu, labbarb    Alcohol Level: No results found for this basename: ETH:2 in the last 168 hours   CT angio of the brain  11/29/2011  Mild small vessel disease type changes without CT evidence of large acute thrombotic infarct.  Right frontal developmental venous anomaly otherwise no evidence of abnormal intracranial enhancing lesion.  Very mild exophthalmos.  Ectatic vertebral arteries. Mild narrowing  of the right vertebral artery with slight ectasia of the left vertebral artery.  Prominent basilar tip without aneurysm.  Mild irregularity of the distal basilar artery without high-grade stenosis.  Ectatic distal vertical cervical segment of the carotid arteries bilaterally.  Calcified plaque cavernous segment of the internal carotid artery bilaterally with mild to slightly moderate regions of narrowing and irregularity.  Middle cerebral artery branch vessel irregularity bilaterally.  MRI of the brain  Reviewed from [facility]. Several tiny left posterior frontal and parietal subcortical WM infarcts  MRA of the brain  See CTA  2D Echocardiogram    Carotid Doppler   Right: No evidence of hemodynamically significant internal carotid artery stenosis. Left: 60-79% internal carotid artery stenosis.  Bilateral: Vertebral artery flow is antegrade.   CXR  Mild emphysematous bronchitic changes with minimal left basilar atelectasis.   EKG  normal sinus rhythm.   Therapy Recommendations PT - ; OT - ; ST -   Physical Exam   Present middle-aged Caucasian male currently not in distress.obese.Awake alert. Afebrile. Head is nontraumatic. Neck is supple without bruit. Hearing is normal. Cardiac exam no murmur or gallop. Lungs are clear to auscultation. Distal pulses are well felt.  Neurological Exam ; Awake  Alert oriented x 3. Normal speech and language. Occasional word hesitancy but no paraphasic errors.[place] movements full without nystagmus.fundi were not visualized. Visual fields and acuity appears normal Face symmetric. Tongue midline. Normal strength, tone, reflexes and coordination. Normal sensation. Gait deferred.  ASSESSMENT [name] is a 71 y.o. 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 Infarcts felt to be artery to artery emboli secondary to symptomatic left ICA stenosis. Recommend left carotid revascularization per vascular surgery. Work up underway. On aspirin 81 mg orally every day prior to admission. Now on aspirin 81 mg orally every day and clopidogrel 75 mg orally every day for secondary stroke prevention. Patient with no resultant neuro deficits.  -left ICA stenosis 60-79% -hypertension 205/85 -hyperlipidemia, on statin, LDL 65 -snores. ? OSA -etoh use, 6 oz gin or vodka daily  Hospital day # 1  [name] discussed with DR. Ashley Royalty.  TREATMENT/PLAN -Stroke team recommendations to continue clopidogrel 75 mg orally every day for secondary stroke prevention. Stop aspirin. Will defer final antiplatelet decision to  VVS. -outpatient sleep apnea evaluation -VVS consult for L CEA in 1-2 weeks -add antihypertensives  Annie Main, MSN, RN, ANVP-BC, ANP-BC, GNP-BC [facility] Pager: [phone] 11/30/2011 10:25  AM  Scribe for Dr. Delia Heady, Stroke Center Medical Director, who has personally reviewed chart, pertinent data, examined the patient and developed the plan of care. Pager:  [phone]

## 2011-11-30 NOTE — ED Provider Notes (Signed)
Medical screening examination/treatment/procedure(s) were conducted as a shared visit with non-physician practitioner(s) and myself.  I personally evaluated the patient during the encounter.  Pt with + stroke on MRI. Currently no complains from patient, and he is resting comfortably. Admit to med/surg.  Derwood Kaplan, MD 11/30/11 7829

## 2011-11-30 NOTE — Progress Notes (Signed)
VASCULAR LAB PRELIMINARY  PRELIMINARY  PRELIMINARY  PRELIMINARY  Carotid duplex  completed.    Preliminary report: Right:  No evidence of hemodynamically significant internal carotid artery stenosis.  Left:  60-79% internal carotid artery stenosis.  Bilateral:  Vertebral artery flow is antegrade.       Renia Mikelson, RVT 11/30/2011, 10:17 AM

## 2011-11-30 NOTE — Consult Note (Signed)
[facility] Consult  Reason for Consult:  Symptomatic left carotid artery stenosis Referring Physician:  Sethi  History of Present Illness: This is a 71 y.o. male here for symptomatic left ICA stenosis.  The pt was going to play racquetball and the others noticed his speech was slurred.  Wife states this started Monday night.  He went to his PCP PA who sent him to get an MRI.  The MRI showed he had a stroke and he was called and told to go to the ED.  He states his speech is still as if he is speaking with a "fat tongue".  He denies any other CVA symptoms.   He is right handed.  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 Infarcts felt to be artery to artery emboli secondary to symptomatic left ICA stenosis and has been referred to vascular surgery.  He has been placed on Plavix and taken off of his daily ASA regimen.  He has Etoh use of 6 oz of gin or Vodka daily at night for martinis and has 1-2 beers at lunch.  He has hypertension, hyperlipidemia for which he takes a statin, and possible OSA.  Has remote history of tobacco use-quit in 2006.  Past Medical History  Diagnosis Date  . Hyperlipidemia    No past surgical history on file. Hernia repair 2006 Shoulder surgery Hammertoe surgery. Recent colonoscopy with polyp removal ~ 2 months ago.  No Known Allergies  Prior to Admission medications   Medication Sig Start Date End Date Taking? Authorizing Provider  ALPRAZolam (XANAX) 0.5 MG tablet Take 0.5 mg by mouth daily as needed. For anxiety   Yes Historical Provider, MD  Ascorbic Acid (VITAMIN C PO) Take 2 tablets by mouth daily.   Yes Historical Provider, MD  aspirin EC 81 MG tablet Take 81 mg by mouth daily.   Yes Historical Provider, MD  Misc Natural Products (GLUCOSAMINE CHONDROITIN ADV PO) Take 1 tablet by mouth daily.   Yes Historical Provider, MD  Multiple Vitamins-Minerals (MULTIVITAMINS THER. W/MINERALS) TABS Take 1  tablet by mouth daily.   Yes Historical Provider, MD  Omega-3 Fatty Acids (FISH OIL PO) Take 1 capsule by mouth daily.   Yes Historical Provider, MD  simvastatin (ZOCOR) 40 MG tablet Take 20 mg by mouth every evening.   Yes Historical Provider, MD    History   Social History  . Marital Status: Married    Spouse Name: N/A    Number of Children: N/A  . Years of Education: N/A   Occupational History  . Not on file.   Social History Main Topics  . Smoking status: Never Smoker   . Smokeless tobacco: Not on file  . Alcohol Use: Yes  . Drug Use:   . Sexually Active:    Other Topics Concern  . Not on file   Social History Narrative  . No narrative on file    No family history on file.  ROS: [x] Positive   [ ] Negative   [ ] All sytems reviewed and are negative  Cardiovascular: []chest pain; []chest pressure; []palpitations; []SOB lying flat; []DOE; []pain in legs with walking; []pain in legs when lying flat; []Hx of DVT; []Hx phlebitis; []swelling in legs; []varicose veins; [x] occasional cramp while sleeping and occasionally while walking Pulmonary: []productive cough; []asthma; []wheezing; [x] possible sleep apnea-tiredness during the day Neurologic: [x]Hx CVA;  []weakness in arms or legs; []numbness in arms or legs; [  xdifficulty in speaking or slurred speech; []temporary loss of vision in one eye; []dizziness Hematologic:  []bleeding problems; []clots easily GI:  []vomiting blood; [] blood in stool; []PUD; [x] recent colonoscopy with polyp removal a couple of months ago GU: [] Dysuria; []hematuria; [x] nocturia x 2-3  Psychiatric:  [x]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   Physical Examination  Filed Vitals:   11/30/11 1429  BP: 160/83  Pulse: 61  Temp: 98.4 F (36.9 C)  Resp: 20    Body mass index is 28.90 kg/(m^2).  General:  WDWN in NAD Gait: Not  observed HENT: WNL Eyes: Pupils equal Pulmonary: normal non-labored breathing , without Rales, rhonchi,  Wheezing, CTAB Cardiac: RRR, without  Murmurs, rubs or gallops; No carotid bruits Abdomen: soft, NT, no masses; +BS Skin: no rashes, ulcers noted Vascular Exam/Pulses:+ palpable bilateral radial, DP pulses noted. Extremities: without ischemic changes, no Gangrene , no cellulitis; no open wounds; darkened great toe left Musculoskeletal: no muscle wasting or atrophy  Neurologic: A&O X 3; Appropriate Affect ; SENSATION: normal; MOTOR FUNCTION:  moving all extremities equally. Speech is somewhat slurred.  Non-Invasive Vascular Imaging:11/30/11 - carotid duplex scan:  Preliminary report: Right: No evidence of hemodynamically significant internal carotid artery stenosis. Left: 60-79% internal carotid artery stenosis. Bilateral: Vertebral artery flow is antegrade.    ASSESSMENT/PLAN: This is a 71 y.o. male admitted with CVA and 60-79% left ICA stenosis  -Dr. Cimberly Stoffel to review Carotid duplex scan-pt most likely will need left CEA -2D echo done-results pending  Samantha Rhyne, PA-C [facility] [phone]  I have examined the patient, reviewed and agree with above. Symptomatic 60-79% left internal carotid artery stenosis. Patient had no motor deficit. He does have some slurred speech which has persisted. MRI reveals multiple small infarcts. Agree with a stroke service that patient does need a left carotid endarterectomy for symptomatic disease. I also agree we would allow a 1-2 weeks or resolution of his acute stroke. I discussed this at length with the patient including risk of surgery. We will plan to readmit him for same-day surgery. He knows to notify us immediately should he develop any neurologic deficits in the interim. He is stable for discharge from vascular surgery standpoint Junnie Loschiavo, MD 11/30/2011 7:21 PM 

## 2011-11-30 NOTE — Evaluation (Addendum)
Physical Therapy Evaluation Patient Details Name: Brandon Mendoza MRN: [id number] DOB: May 27, 1940 Today's Date: 11/30/2011 Time: 4098-1191 PT Time Calculation (min): 17 min  PT Assessment / Plan / Recommendation Clinical Impression  [name] is 71 y/o male admitted with slurred speech, per chart MRI of the brain was performed that reportedly showed a left posterior frontal, anterior parietal acute infarct. Presents to PT at his physical baseline with no further PT needs at this time. Educated pt on s/s of CVA and the importance of seeking medical attention qyuicly, pt able to verbalize this back to therapist.     PT Assessment  Patent does not need any further PT services    Follow Up Recommendations  No PT follow up    Barriers to Discharge        Equipment Recommendations  None recommended by PT    Recommendations for Other Services     Frequency      Precautions / Restrictions Precautions Precautions: None         Mobility  Bed Mobility Bed Mobility: Supine to Sit;Sit to Supine Supine to Sit: 6: Modified independent (Device/Increase time);HOB elevated (30 degrees) Sit to Supine: 6: Modified independent (Device/Increase time);HOB elevated (30 degrees) Transfers Transfers: Sit to Stand;Stand to Sit Sit to Stand: 7: Independent;From bed Stand to Sit: 7: Independent;To bed Ambulation/Gait Ambulation/Gait Assistance: 7: Independent Ambulation Distance (Feet): 400 Feet Assistive device: None Gait Pattern: Within Functional Limits Steps: Pt able to ascend/descend 12 steps without use of rail needing only supervision as pt relatively impulsive with ascent. To descend the pt did slow down and was able to complete the stairs modified independently.         Visit Information  Last PT Received On: 11/30/11 Assistance Needed: +1 PT/OT Co-Evaluation/Treatment: Yes    Subjective Data  Subjective: I don't need you.  Patient Stated Goal: home   Prior Functioning  Home  Living Lives With: Spouse Available Help at Discharge: Family;Available 24 hours/day Type of Home: House Home Access: Stairs to enter [employer] of Steps: 2 Home Layout: Two level;Able to live on main level with bedroom/bathroom Bathroom Shower/Tub: Engineer, manufacturing systems: Standard Home Adaptive Equipment: None Prior Function Level of Independence: Independent Able to Take Stairs?: Yes Driving: Yes Vocation: Retired    IT consultant  Overall Cognitive Status: Appears within functional limits for tasks assessed/performed Arousal/Alertness: Awake/alert Orientation Level: Appears intact for tasks assessed Behavior During Session: [facility] for tasks performed Cognition - Other Comments: a bit high strung but agreeable to our evaluation    Extremity/Trunk Assessment Right Upper Extremity Assessment RUE ROM/Strength/Tone: WFL for tasks assessed RUE Sensation: WFL - Light Touch RUE Coordination: WFL - gross/fine motor Left Upper Extremity Assessment LUE ROM/Strength/Tone: WFL for tasks assessed LUE Sensation: WFL - Light Touch LUE Coordination: WFL - gross/fine motor Right Lower Extremity Assessment RLE ROM/Strength/Tone: WFL for tasks assessed RLE Sensation: WFL - Light Touch RLE Coordination: WFL - gross/fine motor Left Lower Extremity Assessment LLE ROM/Strength/Tone: WFL for tasks assessed LLE Sensation: WFL - Light Touch LLE Coordination: WFL - gross/fine motor Trunk Assessment Trunk Assessment: Normal   Balance Standardized Balance Assessment Standardized Balance Assessment: Dynamic Gait Index Dynamic Gait Index Level Surface: Normal Change in Gait Speed: Normal Gait with Horizontal Head Turns: Normal Gait with Vertical Head Turns: Normal Gait and Pivot Turn: Normal Step Over Obstacle: Mild Impairment Step Around Obstacles: Normal Steps: Mild Impairment Total Score: 22   End of Session PT - End of Session Equipment Utilized During  Treatment: Gait  belt Activity Tolerance: Patient tolerated treatment well Patient left: in bed Nurse Communication: Mobility status  GP     [facility] HELEN 11/30/2011, 3:38 PM

## 2011-12-01 ENCOUNTER — Other Ambulatory Visit: Payer: Self-pay | Admitting: *Deleted

## 2011-12-01 ENCOUNTER — Encounter (HOSPITAL_COMMUNITY): Payer: Self-pay | Admitting: Pharmacy Technician

## 2011-12-01 ENCOUNTER — Encounter: Payer: Self-pay | Admitting: *Deleted

## 2011-12-01 DIAGNOSIS — F1090 Alcohol use, unspecified, uncomplicated: Secondary | ICD-10-CM | POA: Diagnosis present

## 2011-12-01 DIAGNOSIS — F1010 Alcohol abuse, uncomplicated: Secondary | ICD-10-CM

## 2011-12-01 DIAGNOSIS — Z7289 Other problems related to lifestyle: Secondary | ICD-10-CM | POA: Diagnosis present

## 2011-12-01 DIAGNOSIS — I10 Essential (primary) hypertension: Secondary | ICD-10-CM | POA: Diagnosis present

## 2011-12-01 MED ORDER — LISINOPRIL 10 MG PO TABS: 10.0000 mg | ORAL_TABLET | Freq: Every day | ORAL | Status: DC

## 2011-12-01 MED ORDER — LISINOPRIL 10 MG PO TABS
10.0000 mg | ORAL_TABLET | Freq: Every day | ORAL | Status: DC
  Administered 2011-12-01: 10 mg via ORAL
  Filled 2011-12-01: qty 1

## 2011-12-01 MED ORDER — CLOPIDOGREL BISULFATE 75 MG PO TABS: 75.0000 mg | ORAL_TABLET | Freq: Every day | ORAL | Status: DC

## 2011-12-01 NOTE — Progress Notes (Signed)
Stroke Team Progress Note  HISTORY Brandon Mendoza is an 71 y.o. male who reports that last evening 11/28/2011 he noticed that his tongue felt fat and he was speaking a little funny. 11/29/2011 today when he went to play racquetball the other players noted that his speech was slurred as well. When he returned home his wife encouraged him to seek medical attention. He want to [facility] and a MRI of the brain was performed that reportedly showed a left posterior frontal, anterior parietal acute infarct. Patient was referred here for further evaluation.   Patient was not a TPA candidate secondary to delay in arrival. He was admitted for further evaluation and treatment.  SUBJECTIVE No family at bedside. Does not feel speech back to normal yet.  OBJECTIVE Most recent Vital Signs: Filed Vitals:   11/30/11 2130 12/01/11 0130 12/01/11 0526 12/01/11 1015  BP: 151/62 160/60 160/80 178/98  Pulse: 70 70 70 59  Temp: 97.6 F (36.4 C) 97.4 F (36.3 C) 97.6 F (36.4 C) 98.2 F (36.8 C)  TempSrc: Oral Oral Oral Oral  Resp: 18 18 18 18   Height:      Weight:      SpO2: 98% 98% 100% 97%   IV Fluid Intake:     . sodium chloride 50 mL/hr at 11/29/11 2252   MEDICATIONS    . aspirin EC  81 mg Oral Daily  . clopidogrel  75 mg Oral Q breakfast  . enoxaparin  40 mg Subcutaneous Q24H  . lisinopril  10 mg Oral Daily  . simvastatin  20 mg Oral QPM   PRN:  LORazepam, senna-docusate  Diet:  Cardiac thin liquids Activity:   Bathroom privileges DVT Prophylaxis:  Lovenox 40 mg sq daily   CLINICALLY SIGNIFICANT STUDIES Basic Metabolic Panel:   Lab 11/29/11 1519  NA 140  K 3.8  CL 103  CO2 25  GLUCOSE 135*  BUN 22  CREATININE 1.24  CALCIUM 9.7  MG --  PHOS --   Liver Function Tests:   Lab 11/29/11 1519  AST 22  ALT 16  ALKPHOS 68  BILITOT 0.4  PROT 7.3  ALBUMIN 4.2   CBC:   Lab 11/29/11 1519  WBC 7.8  NEUTROABS 4.9  HGB 16.9  HCT 47.0  MCV 89.2  PLT 239   Coagulation:   Lab 11/29/11  1519  LABPROT 13.3  INR 0.99   Cardiac Enzymes:   Lab 11/29/11 1717  CKTOTAL --  CKMB --  CKMBINDEX --  TROPONINI <0.30   Urinalysis:   Lab 11/29/11 1746  COLORURINE YELLOW  LABSPEC 1.027  PHURINE 5.5  GLUCOSEU NEGATIVE  HGBUR NEGATIVE  BILIRUBINUR NEGATIVE  KETONESUR NEGATIVE  PROTEINUR NEGATIVE  UROBILINOGEN 0.2  NITRITE NEGATIVE  LEUKOCYTESUR NEGATIVE   Lipid Panel    Component Value Date/Time   CHOL 148 11/30/2011 0702   TRIG 87 11/30/2011 0702   HDL 66 11/30/2011 0702   CHOLHDL 2.2 11/30/2011 0702   VLDL 17 11/30/2011 0702   LDLCALC 65 11/30/2011 0702   HgbA1C  Lab Results  Component Value Date   HGBA1C 5.7* 11/30/2011   Urine Drug Screen:   No results found for this basename: labopia,  cocainscrnur,  labbenz,  amphetmu,  thcu,  labbarb    Alcohol Level: No results found for this basename: ETH:2 in the last 168 hours   CT angio of the brain  11/29/2011  Mild small vessel disease type changes without CT evidence of large acute thrombotic infarct.  Right frontal  developmental venous anomaly otherwise no evidence of abnormal intracranial enhancing lesion.  Very mild exophthalmos.  Ectatic vertebral arteries. Mild narrowing of the right vertebral artery with slight ectasia of the left vertebral artery.  Prominent basilar tip without aneurysm.  Mild irregularity of the distal basilar artery without high-grade stenosis.  Ectatic distal vertical cervical segment of the carotid arteries bilaterally.  Calcified plaque cavernous segment of the internal carotid artery bilaterally with mild to slightly moderate regions of narrowing and irregularity.  Middle cerebral artery branch vessel irregularity bilaterally.  MRI of the brain  Reviewed from [facility]. Several tiny left posterior frontal and parietal subcortical WM infarcts  MRA of the brain  See CTA  2D Echocardiogram  EF 60-65% with no source of embolus.   Carotid Doppler   Right: No evidence of hemodynamically significant internal  carotid artery stenosis. Left: 60-79% internal carotid artery stenosis. Bilateral: Vertebral artery flow is antegrade.   CXR  Mild emphysematous bronchitic changes with minimal left basilar atelectasis.   EKG  normal sinus rhythm.   Therapy Recommendations PT - none; OT - none; ST -   Physical Exam   Present middle-aged Caucasian male currently not in distress.obese.Awake alert. Afebrile. Head is nontraumatic. Neck is supple without bruit. Hearing is normal. Cardiac exam no murmur or gallop. Lungs are clear to auscultation. Distal pulses are well felt.  Neurological Exam ; Awake  Alert oriented x 3. Normal speech and language. Occasional word hesitancy but no paraphasic errors.[place] movements full without nystagmus.fundi were not visualized. Visual fields and acuity appears normal Face symmetric. Tongue midline. Normal strength, tone, reflexes and coordination. Normal sensation. Gait deferred.   ASSESSMENT Brandon Mendoza is a 71 y.o. 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 Infarcts felt to be artery to artery emboli secondary to symptomatic left ICA stenosis. Recommend left carotid revascularization per vascular surgery. Work up completed. On aspirin 81 mg orally every day prior to admission. Now on aspirin 81 mg orally every day and clopidogrel 75 mg orally every day for secondary stroke prevention. Patient with mild speech deficits, expressive aphasia.   -left ICA stenosis 60-79%. VVS consult. Plans CEA in 1 week or so. -hypertension 205/85 -hyperlipidemia, on statin, LDL 65 -snores. ? OSA -etoh use, 6 oz gin or vodka daily  Hospital day # 2  [name] discussed with DR. Ashley Royalty.  TREATMENT/PLAN -Stroke team recommendations to continue clopidogrel 75 mg orally every day for secondary stroke prevention. Stop aspirin. Will defer final antiplatelet decision to  VVS. -outpatient sleep apnea evaluation - L CEA in 1-2 weeks -take it easy until  OR. Avoid physical exertion and dehydration. -Consider ACCELERATE trial, use of Cholesteryl Ester Transfer Protein (CETP) Inhibitor: Potential of Evacetrapib to treat atherosclerosis and CAD. [facility] will contact patient with information about trial, screen for inclusion. -no follow up therapies recommended -Stroke Service will sign off. Follow up with [name], Stroke Clinic, in 2 months.  Annie Main, MSN, RN, ANVP-BC, ANP-BC, [facility] Pager: [phone] 12/01/2011 10:36 AM  Scribe for Dr. Delia Heady, Stroke Center Medical Director, who has personally reviewed chart, pertinent data, examined the patient and developed the plan of care. Pager:  [phone]

## 2011-12-01 NOTE — Discharge Summary (Signed)
Brandon Mendoza MRN: [id number] DOB/AGE: 1941/01/29 71 y.o.  Admit date: 11/29/2011 Discharge date: 12/01/2011  Primary Care Physician:  Albertina Senegal, MD   Discharge Diagnoses:   Patient Active Problem List  Diagnosis  . CVA (cerebral infarction)  . Hyperlipidemia  . Carotid artery stenosis, symptomatic  . HTN (hypertension), malignant    DISCHARGE MEDICATION: Medication List  As of 12/01/2011 10:29 AM   STOP taking these medications         aspirin EC 81 MG tablet         TAKE these medications         ALPRAZolam 0.5 MG tablet   Commonly known as: XANAX   Take 0.5 mg by mouth daily as needed. For anxiety      clopidogrel 75 MG tablet   Commonly known as: PLAVIX   Take 1 tablet (75 mg total) by mouth daily with breakfast.      FISH OIL PO   Take 1 capsule by mouth daily.      GLUCOSAMINE CHONDROITIN ADV PO   Take 1 tablet by mouth daily.      lisinopril 10 MG tablet   Commonly known as: PRINIVIL,ZESTRIL   Take 1 tablet (10 mg total) by mouth daily.      multivitamins ther. w/minerals Tabs   Take 1 tablet by mouth daily.      simvastatin 40 MG tablet   Commonly known as: ZOCOR   Take 20 mg by mouth every evening.      VITAMIN C PO   Take 2 tablets by mouth daily.              Consults: Treatment Team:  Md Stroke, MD Larina Earthly, MD   SIGNIFICANT DIAGNOSTIC STUDIES:  Ct Angio Head W/cm &/or Wo Cm  11/29/2011  *RADIOLOGY REPORT*  Clinical Data:  Left eye and facial weakness.  Some slurred speech. Hyperlipidemia.  CT ANGIOGRAPHY HEAD  Technique:  Multidetector CT imaging of the head was performed using the standard protocol during bolus administration of intravenous contrast.  Multiplanar CT image reconstructions including MIPs were obtained to evaluate the vascular anatomy.  Contrast: 50mL OMNIPAQUE IOHEXOL 350 MG/ML SOLN  Comparison:   None.  Findings:  No intracranial hemorrhage.  Mild small vessel disease type changes without CT evidence of large acute  thrombotic infarct.  Right frontal developmental venous anomaly otherwise no evidence of abnormal intracranial enhancing lesion.  No hydrocephalus.  Very mild exophthalmos.  Ectatic vertebral arteries. Mild narrowing of the right vertebral artery with slight ectasia of the left vertebral artery.  Prominent basilar tip without aneurysm.  Mild irregularity of the distal basilar artery without high-grade stenosis.  Ectatic distal vertical cervical segment of the carotid arteries bilaterally.  Calcified plaque cavernous segment of the internal carotid artery bilaterally with mild to slightly moderate regions of narrowing and irregularity.  Middle cerebral artery branch vessel irregularity bilaterally.   Review of the MIP images confirms the above findings.  IMPRESSION: Mild small vessel disease type changes without CT evidence of large acute thrombotic infarct.  Right frontal developmental venous anomaly otherwise no evidence of abnormal intracranial enhancing lesion.  Very mild exophthalmos.  Ectatic vertebral arteries. Mild narrowing of the right vertebral artery with slight ectasia of the left vertebral artery.  Prominent basilar tip without aneurysm.  Mild irregularity of the distal basilar artery without high-grade stenosis.  Ectatic distal vertical cervical segment of the carotid arteries bilaterally.  Calcified plaque cavernous segment of the internal carotid  artery bilaterally with mild to slightly moderate regions of narrowing and irregularity.  Middle cerebral artery branch vessel irregularity bilaterally.   Original Report Authenticated By: Fuller Canada, M.D.    Dg Chest 2 View  11/29/2011  *RADIOLOGY REPORT*  Clinical Data: Weakness, stroke  CHEST - 2 VIEW  Comparison: None  Findings: Normal heart size, mediastinal contours, and pulmonary vascularity. Peribronchial thickening and mild emphysematous changes. Minimal left basilar atelectasis. No pulmonary infiltrate, pleural effusion, or pneumothorax.  Multilevel endplate spur formation thoracic spine.  IMPRESSION: Mild emphysematous bronchitic changes with minimal left basilar atelectasis.   Original Report Authenticated By: Lollie Marrow, M.D.      ECHO:   Left ventricle: The cavity size was normal. Wall thickness was normal. Systolic function was normal. The estimated ejection fraction was in the range of 60% to 65%.     OTHER PROCEDURES: Carotid Doppler: -No evidence of stenosis involving the right internal carotid artery. - Findings consistent with 60-79% stenosis involving the left internal carotid artery. - Bilateral vertebral artery flow is antegrade.    BRIEF ADMITTING H & P: Brandon Mendoza is a 71 y.o. male who noticed  that he was having some difficulty speaking when he woke up. He then proceeded to go to a racket tennis game with his friend and was able to complete a game without difficulty. He returned home and his wife convinced him to go to his primary care physician. He was referred for an outpatient MRI which showed an acute stroke and the patient was brought to the emergency room. He has no major appreciable neurological deficits currently.    Hospital Course:  Present on Admission:  .CVA (cerebral infarction): Patient presented with findings of the MRI of the left posterior frontal and anterior parietal acute infarct. The patient was evaluated for stratification was found to have critical internal carotid artery stenosis on the left. He was evaluated by vascular surgery who has recommended possible stenting Versus CEA in approximately 2 weeks. The patient had previously been on aspirin. This is been discontinued and his therapies been escalated to include Plavix. The patient is also continued on simvastatin for independent stroke risk reduction.  .Carotid artery stenosis, symptomatic: Please see above   .HTN (hypertension): Patient has had really elevated blood pressures are here in the hospital. He's been started  on lisinopril 10 mg daily by mouth. He should followup with his primary care physician for further titration of medications. I've counseled the patient against additional alcohol use with she can likely contributing to his hypertension.   .Hyperlipidemia: Patient had a pre-existing history of hyperlipidemia and was on Zocor 20 mg daily. The patient is continued on his usual dose however his lipid profile has normalized at this point   .Habitual ETOH use: The patient admits to occasional alcohol use of 3 martinis per day. Presently he is in the pre-contemplative state and has no intentions of discontinuing alcohol use at this time. He's been counseled however no further intervention beyond that.  Disposition and Follow-up:  Patient is being discharged home in good condition. He's been evaluated by physical therapy occupational and speech therapy all of whom recommended no further follow up. He should followup with his primary care physician within 3-5 days. The patient also is to follow up with Dr. Bosie Helper office for scheduling of what intervention for his carotid artery stenosis. Discharge Orders    Future Orders Please Complete By Expires   Diet - low sodium heart healthy  Activity as tolerated - No restrictions         DISCHARGE EXAM:  General: Alert, awake, oriented x3, in no acute distress.  Vital Signs:Blood pressure 178/98, pulse 59, temperature 98.2 F (36.8 C), temperature source Oral, resp. rate 18, height 5\' 9"  (1.753 m), weight 88.769 kg (195 lb 11.2 oz), SpO2 97.00%. HEENT: [place]/AT PEERL, EOMI  Heart: Regular rate and rhythm, without murmurs, rubs, gallops.  Lungs: Clear to auscultation.  Abdomen: Soft, nontender, nondistended, positive bowel sounds, no masses no hepatosplenomegaly noted.  Neuro: No focal neurological deficits noted cranial nerves II through XII grossly intact. Speech is fluent and no appreciable dysphasia noted. DTRs 2+ bilaterally upper and lower extremities.  Strength normal  in bilateral upper and lower extremities    Basename 11/29/11 1519  NA 140  K 3.8  CL 103  CO2 25  GLUCOSE 135*  BUN 22  CREATININE 1.24  CALCIUM 9.7  MG --  PHOS --    Basename 11/29/11 1519  AST 22  ALT 16  ALKPHOS 68  BILITOT 0.4  PROT 7.3  ALBUMIN 4.2   No results found for this basename: LIPASE:2,AMYLASE:2 in the last 72 hours  Basename 11/29/11 1519  WBC 7.8  NEUTROABS 4.9  HGB 16.9  HCT 47.0  MCV 89.2  PLT 239   Total time for discharge process including face-to-face time in decision making greater than 30 minutes Signed: Kailan Carmen A. 12/01/2011, 10:29 AM

## 2011-12-01 NOTE — Progress Notes (Signed)
Pt given stroke d/c instructions and education

## 2011-12-06 ENCOUNTER — Telehealth: Payer: Self-pay | Admitting: *Deleted

## 2011-12-06 NOTE — Telephone Encounter (Signed)
Pt. Called re: ASA and Plavix prior to surgery on 12-09-11. Per Dr. Arbie Cookey, Brandon Mendoza is to stop taking his Plavix today and start taking the ASA 81mg . Patient voiced understanding of this plan and was in agreement.

## 2011-12-07 ENCOUNTER — Encounter (HOSPITAL_COMMUNITY): Payer: Self-pay

## 2011-12-07 ENCOUNTER — Encounter (HOSPITAL_COMMUNITY)
Admission: RE | Admit: 2011-12-07 | Discharge: 2011-12-07 | Disposition: A | Discharge status: Home / Self Care | Payer: Medicare Other | Source: Ambulatory Visit | Attending: Vascular Surgery | Admitting: Vascular Surgery

## 2011-12-07 HISTORY — DX: Peripheral vascular disease, unspecified: I73.9

## 2011-12-07 HISTORY — DX: Chronic obstructive pulmonary disease, unspecified: J44.9

## 2011-12-07 HISTORY — DX: Personal history of colon polyps, unspecified: Z86.0100

## 2011-12-07 HISTORY — DX: Essential (primary) hypertension: I10

## 2011-12-07 HISTORY — DX: Depression, unspecified: F32.A

## 2011-12-07 HISTORY — DX: Major depressive disorder, single episode, unspecified: F32.9

## 2011-12-07 HISTORY — DX: Nocturia: R35.1

## 2011-12-07 HISTORY — DX: Personal history of colonic polyps: Z86.010

## 2011-12-07 HISTORY — DX: Cerebral infarction, unspecified: I63.9

## 2011-12-07 HISTORY — DX: Anxiety disorder, unspecified: F41.9

## 2011-12-07 LAB — SURGICAL PCR SCREEN: Staphylococcus aureus: POSITIVE — AB

## 2011-12-07 LAB — TYPE AND SCREEN: Antibody Screen: NEGATIVE

## 2011-12-07 LAB — ABO/RH: ABO/RH(D): A NEG

## 2011-12-07 NOTE — Pre-Procedure Instructions (Signed)
[name]  12/07/2011   Your procedure is scheduled on:  Friday, Sept 13  Report to [facility] at 0730 AM.  Call this number if you have problems the morning of surgery: [phone]   Remember:   Do not eat food or drink liquids after:After Midnight.    Take these medicines the morning of surgery with A SIP OF WATER: Xanax,   Do not wear jewelry, make-up or nail polish.  Do not wear lotions, powders, or perfumes. You may wear deodorant.  Do not shave 48 hours prior to surgery. Men may shave face and neck.  Do not bring valuables to the hospital.  Contacts, dentures or bridgework may not be worn into surgery.  Leave suitcase in the car. After surgery it may be brought to your room.  For patients admitted to the hospital, checkout time is 11:00 AM the day of discharge.   Patients discharged the day of surgery will not be allowed to drive home.  Name and phone number of your driver: *n/a**  Special Instructions: CHG Shower Use Special Wash: 1/2 bottle night before surgery and 1/2 bottle morning of surgery.   Please read over the following fact sheets that you were given: Pain Booklet, Coughing and Deep Breathing, Blood Transfusion Information, MRSA Information and Surgical Site Infection Prevention

## 2011-12-08 MED ORDER — DEXTROSE 5 % IV SOLN
1.5000 g | INTRAVENOUS | Status: AC
  Administered 2011-12-09: 1.5 g via INTRAVENOUS
  Filled 2011-12-08: qty 1.5

## 2011-12-09 ENCOUNTER — Ambulatory Visit (HOSPITAL_COMMUNITY): Payer: Medicare Other | Admitting: Anesthesiology

## 2011-12-09 ENCOUNTER — Inpatient Hospital Stay (HOSPITAL_COMMUNITY)
Admission: RE | Admit: 2011-12-09 | Discharge: 2011-12-10 | DRG: 039 | Disposition: A | Discharge status: Home / Self Care | Payer: Medicare Other | Source: Ambulatory Visit | Attending: Vascular Surgery | Admitting: Vascular Surgery

## 2011-12-09 ENCOUNTER — Telehealth: Payer: Self-pay | Admitting: Vascular Surgery

## 2011-12-09 ENCOUNTER — Encounter (HOSPITAL_COMMUNITY): Payer: Self-pay | Admitting: Anesthesiology

## 2011-12-09 ENCOUNTER — Encounter (HOSPITAL_COMMUNITY): Payer: Self-pay | Admitting: *Deleted

## 2011-12-09 ENCOUNTER — Encounter (HOSPITAL_COMMUNITY)
Admission: RE | Disposition: A | Discharge status: Home / Self Care | Payer: Self-pay | Source: Ambulatory Visit | Attending: Vascular Surgery

## 2011-12-09 DIAGNOSIS — I6529 Occlusion and stenosis of unspecified carotid artery: Secondary | ICD-10-CM

## 2011-12-09 DIAGNOSIS — I10 Essential (primary) hypertension: Secondary | ICD-10-CM | POA: Diagnosis present

## 2011-12-09 DIAGNOSIS — J449 Chronic obstructive pulmonary disease, unspecified: Secondary | ICD-10-CM | POA: Diagnosis present

## 2011-12-09 DIAGNOSIS — Z79899 Other long term (current) drug therapy: Secondary | ICD-10-CM

## 2011-12-09 DIAGNOSIS — Z7982 Long term (current) use of aspirin: Secondary | ICD-10-CM

## 2011-12-09 DIAGNOSIS — F3289 Other specified depressive episodes: Secondary | ICD-10-CM | POA: Diagnosis present

## 2011-12-09 DIAGNOSIS — Z01812 Encounter for preprocedural laboratory examination: Secondary | ICD-10-CM

## 2011-12-09 DIAGNOSIS — Z8673 Personal history of transient ischemic attack (TIA), and cerebral infarction without residual deficits: Secondary | ICD-10-CM

## 2011-12-09 DIAGNOSIS — Z87891 Personal history of nicotine dependence: Secondary | ICD-10-CM

## 2011-12-09 DIAGNOSIS — F411 Generalized anxiety disorder: Secondary | ICD-10-CM | POA: Diagnosis present

## 2011-12-09 DIAGNOSIS — E785 Hyperlipidemia, unspecified: Secondary | ICD-10-CM | POA: Diagnosis present

## 2011-12-09 DIAGNOSIS — J4489 Other specified chronic obstructive pulmonary disease: Secondary | ICD-10-CM | POA: Diagnosis present

## 2011-12-09 DIAGNOSIS — F329 Major depressive disorder, single episode, unspecified: Secondary | ICD-10-CM | POA: Diagnosis present

## 2011-12-09 HISTORY — PX: ENDARTERECTOMY: SHX5162

## 2011-12-09 HISTORY — PX: CAROTID ENDARTERECTOMY: SUR193

## 2011-12-09 SURGERY — ENDARTERECTOMY, CAROTID
Anesthesia: General | Site: Neck | Laterality: Left | Wound class: Clean

## 2011-12-09 MED ORDER — ONDANSETRON HCL 4 MG/2ML IJ SOLN
INTRAMUSCULAR | Status: DC | PRN
  Administered 2011-12-09: 4 mg via INTRAVENOUS

## 2011-12-09 MED ORDER — HYDRALAZINE HCL 20 MG/ML IJ SOLN: 10.0000 mg | INTRAMUSCULAR | Status: DC | PRN

## 2011-12-09 MED ORDER — MORPHINE SULFATE 2 MG/ML IJ SOLN
2.0000 mg | INTRAMUSCULAR | Status: DC | PRN
  Administered 2011-12-09 – 2011-12-10 (×4): 2 mg via INTRAVENOUS
  Filled 2011-12-09 (×3): qty 1

## 2011-12-09 MED ORDER — LIDOCAINE HCL (CARDIAC) 20 MG/ML IV SOLN
INTRAVENOUS | Status: DC | PRN
  Administered 2011-12-09: 80 mg via INTRAVENOUS

## 2011-12-09 MED ORDER — PHENYLEPHRINE HCL 10 MG/ML IJ SOLN
10.0000 mg | INTRAVENOUS | Status: DC | PRN
  Administered 2011-12-09: 50 ug/min via INTRAVENOUS

## 2011-12-09 MED ORDER — METOPROLOL TARTRATE 1 MG/ML IV SOLN: 2.0000 mg | INTRAVENOUS | Status: DC | PRN

## 2011-12-09 MED ORDER — CLOPIDOGREL BISULFATE 75 MG PO TABS
75.0000 mg | ORAL_TABLET | Freq: Every day | ORAL | Status: DC
  Administered 2011-12-10: 75 mg via ORAL
  Filled 2011-12-09 (×2): qty 1

## 2011-12-09 MED ORDER — MORPHINE SULFATE 2 MG/ML IJ SOLN
INTRAMUSCULAR | Status: AC
  Filled 2011-12-09: qty 1

## 2011-12-09 MED ORDER — HYDROMORPHONE HCL PF 1 MG/ML IJ SOLN
0.2500 mg | INTRAMUSCULAR | Status: DC | PRN
  Administered 2011-12-09 (×3): 0.5 mg via INTRAVENOUS

## 2011-12-09 MED ORDER — SIMVASTATIN 20 MG PO TABS
20.0000 mg | ORAL_TABLET | Freq: Every evening | ORAL | Status: DC
  Administered 2011-12-09: 20 mg via ORAL
  Filled 2011-12-09 (×2): qty 1

## 2011-12-09 MED ORDER — NEOSTIGMINE METHYLSULFATE 1 MG/ML IJ SOLN
INTRAMUSCULAR | Status: DC | PRN
  Administered 2011-12-09: 4 mg via INTRAVENOUS

## 2011-12-09 MED ORDER — LACTATED RINGERS IV SOLN
INTRAVENOUS | Status: DC
  Administered 2011-12-09: 09:00:00 via INTRAVENOUS

## 2011-12-09 MED ORDER — DEXTROSE 5 % IV SOLN
1.5000 g | Freq: Two times a day (BID) | INTRAVENOUS | Status: AC
  Administered 2011-12-09 – 2011-12-10 (×2): 1.5 g via INTRAVENOUS
  Filled 2011-12-09 (×2): qty 1.5

## 2011-12-09 MED ORDER — HYDROMORPHONE HCL PF 1 MG/ML IJ SOLN
INTRAMUSCULAR | Status: AC
  Filled 2011-12-09: qty 1

## 2011-12-09 MED ORDER — LABETALOL HCL 5 MG/ML IV SOLN: 10.0000 mg | INTRAVENOUS | Status: DC | PRN

## 2011-12-09 MED ORDER — LIDOCAINE HCL (PF) 1 % IJ SOLN
INTRAMUSCULAR | Status: AC
  Filled 2011-12-09: qty 30

## 2011-12-09 MED ORDER — ONDANSETRON HCL 4 MG/2ML IJ SOLN
4.0000 mg | Freq: Four times a day (QID) | INTRAMUSCULAR | Status: DC | PRN
  Administered 2011-12-09 – 2011-12-10 (×2): 4 mg via INTRAVENOUS
  Filled 2011-12-09 (×2): qty 2

## 2011-12-09 MED ORDER — SODIUM CHLORIDE 0.9 % IV SOLN: 500.0000 mL | Freq: Once | INTRAVENOUS | Status: AC | PRN

## 2011-12-09 MED ORDER — KCL IN DEXTROSE-NACL 20-5-0.45 MEQ/L-%-% IV SOLN
INTRAVENOUS | Status: DC
  Administered 2011-12-09: 13:00:00 via INTRAVENOUS
  Filled 2011-12-09 (×3): qty 1000

## 2011-12-09 MED ORDER — ALUM & MAG HYDROXIDE-SIMETH 200-200-20 MG/5ML PO SUSP: 15.0000 mL | ORAL | Status: DC | PRN

## 2011-12-09 MED ORDER — LACTATED RINGERS IV SOLN
INTRAVENOUS | Status: DC | PRN
  Administered 2011-12-09 (×2): via INTRAVENOUS

## 2011-12-09 MED ORDER — LISINOPRIL 10 MG PO TABS
10.0000 mg | ORAL_TABLET | Freq: Every day | ORAL | Status: DC
  Filled 2011-12-09 (×2): qty 1

## 2011-12-09 MED ORDER — HEPARIN SODIUM (PORCINE) 5000 UNIT/ML IJ SOLN
INTRAMUSCULAR | Status: DC | PRN
  Administered 2011-12-09: 09:00:00

## 2011-12-09 MED ORDER — GUAIFENESIN-DM 100-10 MG/5ML PO SYRP: 15.0000 mL | ORAL_SOLUTION | ORAL | Status: DC | PRN

## 2011-12-09 MED ORDER — ONDANSETRON HCL 4 MG/2ML IJ SOLN
INTRAMUSCULAR | Status: AC
  Filled 2011-12-09: qty 2

## 2011-12-09 MED ORDER — OXYCODONE HCL 5 MG PO TABS: 5.0000 mg | ORAL_TABLET | Freq: Four times a day (QID) | ORAL | Status: AC | PRN

## 2011-12-09 MED ORDER — ACETAMINOPHEN 650 MG RE SUPP: 325.0000 mg | RECTAL | Status: DC | PRN

## 2011-12-09 MED ORDER — HEPARIN SODIUM (PORCINE) 1000 UNIT/ML IJ SOLN
INTRAMUSCULAR | Status: DC | PRN
  Administered 2011-12-09: 7000 [IU] via INTRAVENOUS

## 2011-12-09 MED ORDER — PROTAMINE SULFATE 10 MG/ML IV SOLN
INTRAVENOUS | Status: DC | PRN
  Administered 2011-12-09: 50 mg via INTRAVENOUS

## 2011-12-09 MED ORDER — SODIUM CHLORIDE 0.9 % IV SOLN: INTRAVENOUS | Status: DC

## 2011-12-09 MED ORDER — ACETAMINOPHEN 325 MG PO TABS
325.0000 mg | ORAL_TABLET | ORAL | Status: DC | PRN
  Administered 2011-12-10: 650 mg via ORAL
  Filled 2011-12-09: qty 2

## 2011-12-09 MED ORDER — DOCUSATE SODIUM 100 MG PO CAPS
100.0000 mg | ORAL_CAPSULE | Freq: Every day | ORAL | Status: DC
Start: 2011-12-10 — End: 2011-12-10

## 2011-12-09 MED ORDER — ALPRAZOLAM 0.5 MG PO TABS
0.5000 mg | ORAL_TABLET | Freq: Every day | ORAL | Status: DC | PRN
  Administered 2011-12-10: 0.5 mg via ORAL
  Filled 2011-12-09: qty 1

## 2011-12-09 MED ORDER — OXYCODONE HCL 5 MG PO TABS: 5.0000 mg | ORAL_TABLET | ORAL | Status: DC | PRN

## 2011-12-09 MED ORDER — PANTOPRAZOLE SODIUM 40 MG PO TBEC: 40.0000 mg | DELAYED_RELEASE_TABLET | Freq: Every day | ORAL | Status: DC

## 2011-12-09 MED ORDER — ROCURONIUM BROMIDE 100 MG/10ML IV SOLN
INTRAVENOUS | Status: DC | PRN
  Administered 2011-12-09: 50 mg via INTRAVENOUS

## 2011-12-09 MED ORDER — ASPIRIN EC 81 MG PO TBEC
81.0000 mg | DELAYED_RELEASE_TABLET | Freq: Every day | ORAL | Status: DC
  Filled 2011-12-09: qty 1

## 2011-12-09 MED ORDER — 0.9 % SODIUM CHLORIDE (POUR BTL) OPTIME
TOPICAL | Status: DC | PRN
  Administered 2011-12-09 (×2): 1000 mL

## 2011-12-09 MED ORDER — ADULT MULTIVITAMIN W/MINERALS CH
1.0000 | ORAL_TABLET | Freq: Every day | ORAL | Status: DC
  Filled 2011-12-09 (×2): qty 1

## 2011-12-09 MED ORDER — ONDANSETRON HCL 4 MG/2ML IJ SOLN
4.0000 mg | Freq: Four times a day (QID) | INTRAMUSCULAR | Status: AC | PRN
  Administered 2011-12-09: 4 mg via INTRAVENOUS

## 2011-12-09 MED ORDER — FENTANYL CITRATE 0.05 MG/ML IJ SOLN
INTRAMUSCULAR | Status: DC | PRN
  Administered 2011-12-09 (×2): 100 ug via INTRAVENOUS
  Administered 2011-12-09: 50 ug via INTRAVENOUS

## 2011-12-09 MED ORDER — POTASSIUM CHLORIDE CRYS ER 20 MEQ PO TBCR: 20.0000 meq | EXTENDED_RELEASE_TABLET | Freq: Once | ORAL | Status: AC | PRN

## 2011-12-09 MED ORDER — GLYCOPYRROLATE 0.2 MG/ML IJ SOLN
INTRAMUSCULAR | Status: DC | PRN
  Administered 2011-12-09: .7 mg via INTRAVENOUS

## 2011-12-09 MED ORDER — PROPOFOL 10 MG/ML IV BOLUS
INTRAVENOUS | Status: DC | PRN
  Administered 2011-12-09: 30 mg via INTRAVENOUS
  Administered 2011-12-09: 170 mg via INTRAVENOUS

## 2011-12-09 MED ORDER — KCL IN DEXTROSE-NACL 20-5-0.45 MEQ/L-%-% IV SOLN
INTRAVENOUS | Status: AC
  Filled 2011-12-09: qty 1000

## 2011-12-09 MED ORDER — PHENOL 1.4 % MT LIQD
1.0000 | OROMUCOSAL | Status: DC | PRN
  Filled 2011-12-09: qty 177

## 2011-12-09 MED ORDER — VECURONIUM BROMIDE 10 MG IV SOLR
INTRAVENOUS | Status: DC | PRN
  Administered 2011-12-09 (×3): 1 mg via INTRAVENOUS
  Administered 2011-12-09: 2 mg via INTRAVENOUS
  Administered 2011-12-09 (×2): 1 mg via INTRAVENOUS

## 2011-12-09 MED ORDER — DOPAMINE-DEXTROSE 3.2-5 MG/ML-% IV SOLN: 3.0000 ug/kg/min | INTRAVENOUS | Status: DC

## 2011-12-09 SURGICAL SUPPLY — 44 items
BENZOIN TINCTURE PRP APPL 2/3 (GAUZE/BANDAGES/DRESSINGS) ×2 IMPLANT
CANISTER SUCTION 2500CC (MISCELLANEOUS) ×2 IMPLANT
CATH ROBINSON RED A/P 18FR (CATHETERS) ×2 IMPLANT
CLIP LIGATING EXTRA MED SLVR (CLIP) ×2 IMPLANT
CLIP LIGATING EXTRA SM BLUE (MISCELLANEOUS) ×2 IMPLANT
CLOTH BEACON ORANGE TIMEOUT ST (SAFETY) ×2 IMPLANT
CLSR STERI-STRIP ANTIMIC 1/2X4 (GAUZE/BANDAGES/DRESSINGS) ×2 IMPLANT
COVER SURGICAL LIGHT HANDLE (MISCELLANEOUS) ×2 IMPLANT
CRADLE DONUT ADULT HEAD (MISCELLANEOUS) ×2 IMPLANT
DECANTER SPIKE VIAL GLASS SM (MISCELLANEOUS) IMPLANT
DRAIN HEMOVAC 1/8 X 5 (WOUND CARE) IMPLANT
DRAPE WARM FLUID 44X44 (DRAPE) ×2 IMPLANT
ELECT REM PT RETURN 9FT ADLT (ELECTROSURGICAL) ×2
ELECTRODE REM PT RTRN 9FT ADLT (ELECTROSURGICAL) ×1 IMPLANT
EVACUATOR SILICONE 100CC (DRAIN) IMPLANT
GEL ULTRASOUND 20GR AQUASONIC (MISCELLANEOUS) IMPLANT
GLOVE BIO SURGEON STRL SZ 6.5 (GLOVE) ×4 IMPLANT
GLOVE BIOGEL PI IND STRL 6.5 (GLOVE) ×1 IMPLANT
GLOVE BIOGEL PI INDICATOR 6.5 (GLOVE) ×1
GLOVE SS BIOGEL STRL SZ 7.5 (GLOVE) ×1 IMPLANT
GLOVE SUPERSENSE BIOGEL SZ 7.5 (GLOVE) ×1
GLOVE SURG SS PI 6.5 STRL IVOR (GLOVE) ×2 IMPLANT
GOWN STRL NON-REIN LRG LVL3 (GOWN DISPOSABLE) ×6 IMPLANT
KIT BASIN OR (CUSTOM PROCEDURE TRAY) ×2 IMPLANT
KIT ROOM TURNOVER OR (KITS) ×2 IMPLANT
NEEDLE 22X1 1/2 (OR ONLY) (NEEDLE) IMPLANT
NS IRRIG 1000ML POUR BTL (IV SOLUTION) ×4 IMPLANT
PACK CAROTID (CUSTOM PROCEDURE TRAY) ×2 IMPLANT
PAD ARMBOARD 7.5X6 YLW CONV (MISCELLANEOUS) ×4 IMPLANT
PATCH HEMASHIELD 8X75 (Vascular Products) ×2 IMPLANT
SHUNT CAROTID BYPASS 10 (VASCULAR PRODUCTS) ×2 IMPLANT
SHUNT CAROTID BYPASS 12FRX15.5 (VASCULAR PRODUCTS) IMPLANT
SPECIMEN JAR SMALL (MISCELLANEOUS) ×2 IMPLANT
STRIP CLOSURE SKIN 1/2X4 (GAUZE/BANDAGES/DRESSINGS) ×2 IMPLANT
SUT ETHILON 3 0 PS 1 (SUTURE) IMPLANT
SUT PROLENE 6 0 CC (SUTURE) ×6 IMPLANT
SUT VIC AB 3-0 SH 27 (SUTURE) ×2
SUT VIC AB 3-0 SH 27X BRD (SUTURE) ×2 IMPLANT
SUT VICRYL 4-0 PS2 18IN ABS (SUTURE) ×2 IMPLANT
SYR CONTROL 10ML LL (SYRINGE) IMPLANT
TAPE CLOTH SURG 4X10 WHT LF (GAUZE/BANDAGES/DRESSINGS) ×2 IMPLANT
TOWEL OR 17X24 6PK STRL BLUE (TOWEL DISPOSABLE) ×2 IMPLANT
TOWEL OR 17X26 10 PK STRL BLUE (TOWEL DISPOSABLE) ×2 IMPLANT
WATER STERILE IRR 1000ML POUR (IV SOLUTION) ×2 IMPLANT

## 2011-12-09 NOTE — Interval H&P Note (Signed)
History and Physical Interval Note:  12/09/2011 9:32 AM  Brandon Mendoza  has presented today for surgery, with the diagnosis of LEFT ICA STENOSIS  The various methods of treatment have been discussed with the patient and family. After consideration of risks, benefits and other options for treatment, the patient has consented to  Procedure(s) (LRB) with comments: ENDARTERECTOMY CAROTID (Left) as a surgical intervention .  The patient's history has been reviewed, patient examined, no change in status, stable for surgery.  I have reviewed the patient's chart and labs.  Questions were answered to the patient's satisfaction.     EARLY, TODD

## 2011-12-09 NOTE — Transfer of Care (Signed)
Immediate Anesthesia Transfer of Care Note  Patient: Brandon Mendoza  Procedure(s) Performed: Procedure(s) (LRB) with comments: ENDARTERECTOMY CAROTID (Left)  Patient Location: PACU  Anesthesia Type: General  Level of Consciousness: awake and alert   Airway & Oxygen Therapy: Patient Spontanous Breathing and Patient connected to nasal cannula oxygen  Post-op Assessment: Report given to PACU RN and Post -op Vital signs reviewed and stable  Post vital signs: Reviewed and stable  Complications: No apparent anesthesia complications

## 2011-12-09 NOTE — Discharge Summary (Signed)
[facility] Discharge Summary  Brandon Mendoza 12/07/1940 71 y.o. male  [id number]  Admission Date: 12/09/2011  Discharge Date: 12/10/2011  Physician: Larina Earthly, MD  Admission Diagnosis: LEFT ICA STENOSIS   HPI:   This is a 71 y.o. male here for symptomatic left ICA stenosis. The pt was going to play racquetball and the others noticed his speech was slurred. Wife states this started Monday night. He went to his PCP PA who sent him to get an MRI. The MRI showed he had a stroke and he was called and told to go to the ED. He states his speech is still as if he is speaking with a "fat tongue". He denies any other CVA symptoms. He is right handed.  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 Infarcts felt to be artery to artery emboli secondary to symptomatic left ICA stenosis and has been referred to vascular surgery. He has been placed on Plavix and taken off of his daily ASA regimen.  He has Etoh use of 6 oz of gin or Vodka daily at night for martinis and has 1-2 beers at lunch. He has hypertension, hyperlipidemia for which he takes a statin, and possible OSA. Has remote history of tobacco use-quit in 2006.   Hospital Course:  The patient was admitted to the hospital and taken to the operating room on 12/09/2011 and underwent left carotid endarterectomy.  The pt tolerated the procedure well and was transported to the PACU in good condition.   By POD 1, the pt neuro status alert and oriented x 3.  The remainder of the hospital course consisted of increasing mobilization and increasing intake of solids without difficulty.   No results found for this basename: NA:2,K:2,CL:2,CO2:2,GLUCOSE:2,BUN:2,CRATININE:2,CALCIUM:2 in the last 72 hours No results found for this basename: WBC:2,HGB:2,HCT:2,PLT:2 in the last 72 hours No results found for this basename: INR:2 in the last 72 hours   Discharge Instructions:   The patient is  discharged to home with extensive instructions on wound care and progressive ambulation.  They are instructed not to drive or perform any heavy lifting until returning to see the physician in his office.  Discharge Orders    Future Orders Please Complete By Expires   Resume previous diet      Driving Restrictions      Comments:   No driving for 2 weeks   Lifting restrictions      Comments:   No lifting for 6 weeks   Call MD for:  temperature >100.5      Call MD for:  redness, tenderness, or signs of infection (pain, swelling, bleeding, redness, odor or green/yellow discharge around incision site)      Call MD for:  severe or increased pain, loss or decreased feeling  in affected limb(s)      CAROTID Sugery: Call MD for difficulty swallowing or speaking; weakness in arms or legs that is a new symtom; severe headache.  If you have increased swelling in the neck and/or  are having difficulty breathing, CALL 911      Discharge wound care:      Comments:   Shower daily with soap and water starting 12/11/11      Discharge Diagnosis:  LEFT ICA STENOSIS  Secondary Diagnosis: Patient Active Problem List  Diagnosis  . CVA (cerebral infarction)  . Hyperlipidemia  . Carotid artery stenosis, symptomatic  . HTN (hypertension), malignant  . Habitual alcohol use  Past Medical History  Diagnosis Date  . Hyperlipidemia   . 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 Hypertension   . Anxiety   . Depression   . COPD (chronic obstructive pulmonary disease)   . Peripheral vascular disease   . Nocturia   . Hx of colonic polyps      Deshan, [place]  Home Medication Instructions AVW:[id number]   Printed on:12/09/11 1144  Medication Information                    simvastatin (ZOCOR) 40 MG tablet Take 20 mg by mouth every evening.           Ascorbic Acid (VITAMIN C PO) Take 2 tablets by mouth daily.           Omega-3 Fatty Acids (FISH OIL PO) Take 1 capsule by mouth daily.           Multiple  Vitamins-Minerals (MULTIVITAMINS THER. W/MINERALS) TABS Take 1 tablet by mouth daily.           Misc Natural Products (GLUCOSAMINE CHONDROITIN ADV PO) Take 1 tablet by mouth daily.           ALPRAZolam (XANAX) 0.5 MG tablet Take 0.5 mg by mouth daily as needed. For anxiety           aspirin EC 81 MG tablet Take 81 mg by mouth daily.           clopidogrel (PLAVIX) 75 MG tablet Take 75 mg by mouth daily with breakfast.           lisinopril (PRINIVIL,ZESTRIL) 10 MG tablet Take 10 mg by mouth daily.           oxyCODONE (ROXICODONE) 5 MG immediate release tablet Take 1 tablet (5 mg total) by mouth every 6 (six) hours as needed for pain. #30 NR            Disposition: stable  Patient's condition: is Good  Follow up: 1. Dr.  Arbie Cookey in 2 weeks.

## 2011-12-09 NOTE — Telephone Encounter (Addendum)
Message copied by Shari Prows on Fri Dec 09, 2011  1:13 PM ------      Message from: Sharee Pimple      Created: Fri Dec 09, 2011 12:37 PM      Regarding: schedule                   ----- Message -----         From: Dara Lords, PA         Sent: 12/09/2011  11:43 AM           To: Sharee Pimple, CMA            S/p Left CEA by TFE            F/u with him in 2 weeks.            Thanks,      Lelon Mast  I scheduled an appt for the above pt on 12/20/11 at 11am. I mailed an appt letter to the patient and also left a voice mail. awt

## 2011-12-09 NOTE — Anesthesia Procedure Notes (Signed)
Procedure Name: Intubation Date/Time: 12/09/2011 9:46 AM Performed by: Garen Lah Pre-anesthesia Checklist: Patient identified, Timeout performed, Emergency Drugs available, Suction available and Patient being monitored Patient Re-evaluated:Patient Re-evaluated prior to inductionOxygen Delivery Method: Circle system utilized Preoxygenation: Pre-oxygenation with 100% oxygen Intubation Type: IV induction Ventilation: Mask ventilation without difficulty Laryngoscope Size: Mac and 3 Grade View: Grade III Tube type: Oral Tube size: 8.0 mm Number of attempts: 1 Airway Equipment and Method: Stylet Placement Confirmation: ETT inserted through vocal cords under direct vision,  positive ETCO2 and breath sounds checked- equal and bilateral Secured at: 22 cm Tube secured with: Tape Dental Injury: Teeth and Oropharynx as per pre-operative assessment  Difficulty Due To: Difficulty was unanticipated and Difficult Airway- due to limited oral opening

## 2011-12-09 NOTE — Preoperative (Signed)
Beta Blockers   Reason not to administer Beta Blockers:Not Applicable. No home beta blockers 

## 2011-12-09 NOTE — H&P (View-Only) (Signed)
[facility] Consult  Reason for Consult:  Symptomatic left carotid artery stenosis Referring Physician:  Pearlean Brownie  History of Present Illness: This is a 71 y.o. male here for symptomatic left ICA stenosis.  The pt was going to play racquetball and the others noticed his speech was slurred.  Wife states this started Monday night.  He went to his PCP PA who sent him to get an MRI.  The MRI showed he had a stroke and he was called and told to go to the ED.  He states his speech is still as if he is speaking with a "fat tongue".  He denies any other CVA symptoms.   He is right handed.  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 Infarcts felt to be artery to artery emboli secondary to symptomatic left ICA stenosis and has been referred to vascular surgery.  He has been placed on Plavix and taken off of his daily ASA regimen.  He has Etoh use of 6 oz of gin or Vodka daily at night for martinis and has 1-2 beers at lunch.  He has hypertension, hyperlipidemia for which he takes a statin, and possible OSA.  Has remote history of tobacco use-quit in 2006.  Past Medical History  Diagnosis Date  . Hyperlipidemia    No past surgical history on file. Hernia repair 2006 Shoulder surgery Hammertoe surgery. Recent colonoscopy with polyp removal ~ 2 months ago.  No Known Allergies  Prior to Admission medications   Medication Sig Start Date End Date Taking? Authorizing Provider  ALPRAZolam Prudy Feeler) 0.5 MG tablet Take 0.5 mg by mouth daily as needed. For anxiety   Yes Historical Provider, MD  Ascorbic Acid (VITAMIN C PO) Take 2 tablets by mouth daily.   Yes Historical Provider, MD  aspirin EC 81 MG tablet Take 81 mg by mouth daily.   Yes Historical Provider, MD  Misc Natural Products (GLUCOSAMINE CHONDROITIN ADV PO) Take 1 tablet by mouth daily.   Yes Historical Provider, MD  Multiple Vitamins-Minerals (MULTIVITAMINS THER. W/MINERALS) TABS Take 1  tablet by mouth daily.   Yes Historical Provider, MD  Omega-3 Fatty Acids (FISH OIL PO) Take 1 capsule by mouth daily.   Yes Historical Provider, MD  simvastatin (ZOCOR) 40 MG tablet Take 20 mg by mouth every evening.   Yes Historical Provider, MD    History   Social History  . Marital Status: Married    Spouse Name: N/A    Number of Children: N/A  . Years of Education: N/A   Occupational History  . Not on file.   Social History Main Topics  . Smoking status: Never Smoker   . Smokeless tobacco: Not on file  . Alcohol Use: Yes  . Drug Use:   . Sexually Active:    Other Topics Concern  . Not on file   Social History Narrative  . No narrative on file    No family history on file.  ROS: [x]  Positive   [ ]  Negative   [ ]  All sytems reviewed and are negative  Cardiovascular: [] chest pain; [] chest pressure; [] palpitations; [] SOB lying flat; [] DOE; [] pain in legs with walking; [] pain in legs when lying flat; [] Hx of DVT; [] Hx phlebitis; [] swelling in legs; [] varicose veins; [x]  occasional cramp while sleeping and occasionally while walking Pulmonary: [] productive cough; [] asthma; [] wheezing; [x]  possible sleep apnea-tiredness during the day Neurologic: [x] Hx CVA;  [] weakness in arms or legs; [] numbness in arms or legs; [  xdifficulty in speaking or slurred speech; [] temporary loss of vision in one eye; [] dizziness Hematologic:  [] bleeding problems; [] clots easily GI:  [] vomiting blood; []  blood in stool; [] PUD; [x]  recent colonoscopy with polyp removal a couple of months ago GU: []  Dysuria; [] hematuria; [x]  nocturia x 2-3  Psychiatric:  [x] Hx depression-takes xanax when needed Integumentary:  [] rashes; [] ulcers Constitutional:  [] fever; [] chills Endocrine: []  diabetes  []  thyroid disease.   Physical Examination  Filed Vitals:   11/30/11 1429  BP: 160/83  Pulse: 61  Temp: 98.4 F (36.9 C)  Resp: 20    Body mass index is 28.90 kg/(m^2).  General:  WDWN in NAD Gait: Not  observed HENT: WNL Eyes: Pupils equal Pulmonary: normal non-labored breathing , without Rales, rhonchi,  Wheezing, CTAB Cardiac: RRR, without  Murmurs, rubs or gallops; No carotid bruits Abdomen: soft, NT, no masses; +BS Skin: no rashes, ulcers noted Vascular Exam/Pulses:+ palpable bilateral radial, DP pulses noted. Extremities: without ischemic changes, no Gangrene , no cellulitis; no open wounds; darkened great toe left Musculoskeletal: no muscle wasting or atrophy  Neurologic: A&O X 3; Appropriate Affect ; SENSATION: normal; MOTOR FUNCTION:  moving all extremities equally. Speech is somewhat slurred.  Non-Invasive Vascular Imaging:11/30/11 - carotid duplex scan:  Preliminary report: Right: No evidence of hemodynamically significant internal carotid artery stenosis. Left: 60-79% internal carotid artery stenosis. Bilateral: Vertebral artery flow is antegrade.    ASSESSMENT/PLAN: This is a 71 y.o. male admitted with CVA and 60-79% left ICA stenosis  -Dr. Arbie Cookey to review Carotid duplex scan-pt most likely will need left CEA -2D echo done-results pending  Doreatha Massed, PA-C [facility] [phone]  I have examined the patient, reviewed and agree with above. Symptomatic 60-79% left internal carotid artery stenosis. Patient had no motor deficit. He does have some slurred speech which has persisted. MRI reveals multiple small infarcts. Agree with a stroke service that patient does need a left carotid endarterectomy for symptomatic disease. I also agree we would allow a 1-2 weeks or resolution of his acute stroke. I discussed this at length with the patient including risk of surgery. We will plan to readmit him for same-day surgery. He knows to notify us immediately should he develop any neurologic deficits in the interim. He is stable for discharge from vascular surgery standpoint Danitra Payano, MD 11/30/2011 7:21 PM

## 2011-12-09 NOTE — Anesthesia Postprocedure Evaluation (Signed)
Anesthesia Post Note  Patient: Brandon Mendoza  Procedure(s) Performed: Procedure(s) (LRB): ENDARTERECTOMY CAROTID (Left)  Anesthesia type: General  Patient location: PACU  Post pain: Pain level controlled and Adequate analgesia  Post assessment: Post-op Vital signs reviewed, Patient's Cardiovascular Status Stable, Respiratory Function Stable, Patent Airway and Pain level controlled  Last Vitals:  Filed Vitals:   12/09/11 1300  BP: 106/50  Pulse: 52  Temp:   Resp: 12    Post vital signs: Reviewed and stable  Level of consciousness: awake, alert  and oriented  Complications: No apparent anesthesia complications

## 2011-12-09 NOTE — Anesthesia Preprocedure Evaluation (Addendum)
Anesthesia Evaluation  Patient identified by MRN, date of birth, ID band Patient awake    Reviewed: Allergy & Precautions, H&P , NPO status , Patient's Chart, lab work & pertinent test results, reviewed documented beta blocker date and time   Airway Mallampati: II  Neck ROM: full    Dental  (+) Teeth Intact and Dental Advisory Given   Pulmonary COPDformer smoker,          Cardiovascular hypertension, Pt. on medications Rhythm:Regular     Neuro/Psych PSYCHIATRIC DISORDERS Anxiety Depression CVA, Residual Symptoms    GI/Hepatic   Endo/Other    Renal/GU      Musculoskeletal   Abdominal   Peds  Hematology   Anesthesia Other Findings   Reproductive/Obstetrics                          Anesthesia Physical Anesthesia Plan  ASA: III  Anesthesia Plan: General   Post-op Pain Management:    Induction: Intravenous  Airway Management Planned: Oral ETT  Additional Equipment: Arterial line  Intra-op Plan:   Post-operative Plan: Extubation in OR  Informed Consent: I have reviewed the patients History and Physical, chart, labs and discussed the procedure including the risks, benefits and alternatives for the proposed anesthesia with the patient or authorized representative who has indicated his/her understanding and acceptance.   Dental advisory given  Plan Discussed with: CRNA and Surgeon  Anesthesia Plan Comments:        Anesthesia Quick Evaluation

## 2011-12-09 NOTE — Progress Notes (Signed)
ANTIBIOTIC CONSULT NOTE - INITIAL  Pharmacy Consult for Pharmacy may adjust antibiotics for renal function Indication: Zinacef x 2 doses post-op  No Known Allergies  Patient Measurements: Height: 5\' 9"  (175.3 cm) Weight: 195 lb 8.8 oz (88.7 kg) IBW/kg (Calculated) : 70.7  Adjusted Body Weight:   Vital Signs: Temp: 98.7 F (37.1 C) (09/13 1813) Temp src: Oral (09/13 1813) BP: 126/59 mmHg (09/13 1813) Pulse Rate: 59  (09/13 1813) Intake/Output from previous day:   Intake/Output from this shift: Total I/O In: 2163.3 [I.V.:2163.3] Out: 225 [Urine:75; Blood:150]  Labs: No results found for this basename: WBC:3,HGB:3,PLT:3,LABCREA:3,CREATININE:3 in the last 72 hours Estimated Creatinine Clearance: 59.3 ml/min (by C-G formula based on Cr of 1.24). No results found for this basename: VANCOTROUGH:2,VANCOPEAK:2,VANCORANDOM:2,GENTTROUGH:2,GENTPEAK:2,GENTRANDOM:2,TOBRATROUGH:2,TOBRAPEAK:2,TOBRARND:2,AMIKACINPEAK:2,AMIKACINTROU:2,AMIKACIN:2, in the last 72 hours   Microbiology: Recent Results (from the past 720 hour(s))  SURGICAL PCR SCREEN     Status: Abnormal   Collection Time   12/07/11  2:01 PM      Component Value Range Status Comment   MRSA, PCR NEGATIVE  NEGATIVE Final    Staphylococcus aureus POSITIVE (*) NEGATIVE Final     Medical History: Past Medical History  Diagnosis Date  . Hyperlipidemia   . 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 Hypertension   . Anxiety   . Depression   . COPD (chronic obstructive pulmonary disease)   . Peripheral vascular disease   . Nocturia   . Hx of colonic polyps    Assessment: 71yo male s/p L-CEA to receive Zinacef 1.5gm IV q12 x 2 doses.  Cr 1.24, estimated CrCl ~ 45ml/min  Plan:  1.  No dosage adjustment needed.  Marisue Humble, PharmD Clinical Pharmacist [facility]

## 2011-12-09 NOTE — Op Note (Signed)
[facility]  Patient name: Brandon Mendoza MRN: [id number] DOB: 1941/02/21 Sex: male  12/09/2011 Pre-operative Diagnosis: Symptomatic left carotid stenosis Post-operative diagnosis:  Same Surgeon:  Larina Earthly, M.D. Assistants:  Rhyne Procedure:    left carotid Endarterectomy with Dacron patch angioplasty Anesthesia:  General Blood Loss:  See anesthesia record Specimens:  Carotid Plaque to pathology  Indications for surgery:  Patient had a stroke to 3 weeks ago resulting in speech slurring. The was found to have a 60-70% left internal carotid artery stenosis MRI showed multiple small infarcts on the left. Recommended he undergo endarterectomy for reduction recurrent stroke risk  Procedure in detail:  The patient was taken to the operating and placed in the supine position. The neck was prepped and draped in the usual sterile fashion. An incision was made anterior to the sternocleidomastoid muscle and continued with electrocautery through the platysma muscle. The muscle was retracted posteriorly and the carotid sheath was opened. The facial vein was ligated with 2-0 silk ties and divided. The common carotid artery was encircled with an umbilical tape and Rummel tourniquet. Dissection was continued onto the carotid bifurcation. The superior thyroid artery was controlled with a 2-0 silk Potts tie. The external carotid organ was encircled with a vessel loop and the internal carotid was encircled with umbilical tape and Rummel tourniquet. The hypoglossal and vagus nerves were identified and preserved.  The patient was given systemic heparinization. After adequate circulation time, the internal,external and common carotid arteries were occluded. The common carotid was opened with an 11 blade and the arteriotomy was continued with Potts scissors onto the internal carotid artery. A 10 shunt was passed up the internal carotid artery, allowed to back bleed, and then passed down  the common carotid artery. The shunt was secured with Rummel tourniquet. The endarterectomy was begun on the common carotid artery  plaque was divided proximally with Potts scissors. The endarterectomy was continued onto the carotid bifurcation. The external carotid was endarterectomized by eversion technique and the internal carotid artery was endarterectomized in an open fashion. Remaining debris was removed from the endarterectomy plane. A Dacron patch was brought to the field and sewn as a patch angioplasty. Prior to completing the anastomosis, the shunt was removed and the usual flushing maneuvers were undertaken. The anastomosis was then completed and flow was restored first to the external and then the internal carotid artery. Excellent flow characteristics were noted with hand-held Doppler in the internal and external carotid arteries.  The patient was given protamine to reverse the heparin. Hemostasis was obtained with electrocautery. The wounds were irrigated with saline. The wound was closed by first reapproximating the sternocleidomastoid muscle over the carotid artery with interrupted 3-0 Vicryl sutures. Next, the platysma was closed with a running 3-0 Vicryl suture. The skin was closed with a 4-0 subcuticular Vicryl suture. Benzoin and Steri-Strips were applied to the incision. A sterile dressing was placed over the incision. All sponge and needle counts were correct. The patient was awakened in the operating room, neurologically intact. They were transferred to the PACU in stable condition.   Disposition:  To PACU in stable condition,neurologically intact  Relevant Operative Details:  Patient did have a intraplaque hemorrhage and a partially 70% stenosis  Larina Earthly, M.D. [facility] Office: [phone] Pager:  [phone]

## 2011-12-10 LAB — CBC
HCT: 40.9 % (ref 39.0–52.0)
Hemoglobin: 14.4 g/dL (ref 13.0–17.0)
MCHC: 35.2 g/dL (ref 30.0–36.0)
RBC: 4.61 MIL/uL (ref 4.22–5.81)

## 2011-12-10 LAB — BASIC METABOLIC PANEL
BUN: 15 mg/dL (ref 6–23)
CO2: 24 mEq/L (ref 19–32)
Chloride: 103 mEq/L (ref 96–112)
GFR calc non Af Amer: 85 mL/min — ABNORMAL LOW (ref >90)
Glucose, Bld: 134 mg/dL — ABNORMAL HIGH (ref 70–99)
Potassium: 4.1 mEq/L (ref 3.5–5.1)

## 2011-12-10 NOTE — Progress Notes (Signed)
VASCULAR AND VEIN SURGERY POST - OP CEA PROGRESS NOTE  Date of Surgery: 12/09/2011 Surgeon: Moishe Spice): Larina Earthly, MD 1 Day Post-Op left Carotid Endarterectomy .  HPI: Brandon Mendoza is a 71 y.o. male who is 1 Day Post-Op left Carotid Endarterectomy . Patient is doing well. Pre-operative symptoms are Improved Patient denies headache; Patient denies difficulty swallowing; denies weakness in upper or lower extremities; Pt. denies other symptoms of stroke or TIA.  IMAGING: No results found.  Significant Diagnostic Studies: CBC Lab Results  Component Value Date   WBC 8.9 12/10/2011   HGB 14.4 12/10/2011   HCT 40.9 12/10/2011   MCV 88.7 12/10/2011   PLT 197 12/10/2011    BMET    Component Value Date/Time   NA 136 12/10/2011 0355   K 4.1 12/10/2011 0355   CL 103 12/10/2011 0355   CO2 24 12/10/2011 0355   GLUCOSE 134* 12/10/2011 0355   BUN 15 12/10/2011 0355   CREATININE 0.86 12/10/2011 0355   CALCIUM 8.7 12/10/2011 0355   GFRNONAA 85* 12/10/2011 0355   GFRAA >90 12/10/2011 0355    COAG Lab Results  Component Value Date   INR 0.99 11/29/2011   No results found for this basename: PTT      Intake/Output Summary (Last 24 hours) at 12/10/11 0749 Last data filed at 12/10/11 0600  Gross per 24 hour  Intake 3113.33 ml  Output    450 ml  Net 2663.33 ml    Physical Exam:  BP Readings from Last 3 Encounters:  12/10/11 112/62  12/10/11 112/62  12/07/11 149/78   Temp Readings from Last 3 Encounters:  12/10/11 97.5 F (36.4 C) Oral  12/10/11 97.5 F (36.4 C) Oral  12/07/11 97.4 F (36.3 C) Oral   SpO2 Readings from Last 3 Encounters:  12/10/11 98%  12/10/11 98%  12/07/11 95%   Pulse Readings from Last 3 Encounters:  12/10/11 59  12/10/11 59  12/07/11 65    Pt is A&O x 3 Gait is normal Speech is slurred due to prior stroke before surgery left Neck Wound is healing well Patient with Negative tongue deviation and Negative facial droop Pt has good and equal strength  in all extremities  Assessment: Brandon Mendoza is a 71 y.o. male is S/P Left Carotid endarterectomy Pt is voiding, ambulating and taking po well   Plan: Discharge to: Home Follow-up in 2 weeks   Clinton Gallant [facility] [phone] 12/10/2011 7:49 AM

## 2011-12-10 NOTE — Progress Notes (Signed)
12/10/2011 9:20 AM  Discussed discharge instructions and medications with patient and wife. Answered all questions. Vss. Pt has no complaints of pain. Surgical site is WNL, open to air with steri strips intact. PIV dc'ed with tip intact. Pt discharged home with family.  [name] 9/14/20139:20 AM

## 2011-12-12 ENCOUNTER — Encounter (HOSPITAL_COMMUNITY): Payer: Self-pay | Admitting: Vascular Surgery

## 2011-12-15 ENCOUNTER — Encounter: Payer: Self-pay | Admitting: Neurosurgery

## 2011-12-15 ENCOUNTER — Ambulatory Visit (INDEPENDENT_AMBULATORY_CARE_PROVIDER_SITE_OTHER): Payer: Medicare Other | Admitting: Neurosurgery

## 2011-12-15 VITALS — BP 155/82 | HR 58 | Resp 16 | Ht 69.0 in | Wt 193.0 lb

## 2011-12-15 DIAGNOSIS — Z48812 Encounter for surgical aftercare following surgery on the circulatory system: Secondary | ICD-10-CM

## 2011-12-15 DIAGNOSIS — I6529 Occlusion and stenosis of unspecified carotid artery: Secondary | ICD-10-CM

## 2011-12-15 NOTE — Progress Notes (Signed)
Subjective:     Patient ID: Brandon Mendoza, male   DOB: [birth year], 71 y.o.   MRN: [id number]  HPI: 71 year old male patient of Dr. Arbie Cookey who underwent a left ECA on September 19. The patient walked into the office today for a wound check as he thought there was an extraordinary amount of swelling along the incision line.   Review of Systems: 12 point review of systems is notable for the difficulties described above otherwise unremarkable     Objective:   Physical Exam: Afebrile, vital signs are stable, Dr. Arbie Cookey examined the patient and reassured him that what he was feeling was the healing ridge and there are no complications.     Assessment:     Assessment as above    Plan:     Dr. Arbie Cookey explained to the patient and his wife they could keep their appointment with him next week, otherwise if he was doing well he will followup in 6 months with repeat carotid duplex, his questions were encouraged and answered, he is in agreement with this plan.     Lauree Chandler ANP  Clinic M.D.: Early

## 2011-12-16 DIAGNOSIS — I6529 Occlusion and stenosis of unspecified carotid artery: Secondary | ICD-10-CM

## 2011-12-19 ENCOUNTER — Encounter: Payer: Self-pay | Admitting: Vascular Surgery

## 2011-12-20 ENCOUNTER — Ambulatory Visit (INDEPENDENT_AMBULATORY_CARE_PROVIDER_SITE_OTHER): Payer: Medicare Other | Admitting: Vascular Surgery

## 2011-12-20 ENCOUNTER — Encounter: Payer: Self-pay | Admitting: Vascular Surgery

## 2011-12-20 VITALS — BP 150/80 | HR 57 | Temp 98.2°F | Ht 69.0 in | Wt 192.5 lb

## 2011-12-20 DIAGNOSIS — Z48812 Encounter for surgical aftercare following surgery on the circulatory system: Secondary | ICD-10-CM

## 2011-12-20 DIAGNOSIS — I6529 Occlusion and stenosis of unspecified carotid artery: Secondary | ICD-10-CM

## 2011-12-20 NOTE — Progress Notes (Signed)
Patient presents today for kidney followup of his left carotid endarterectomy for symptomatic disease on 12/09/2011. He is wanting to the office last week concerning swelling in his left neck. He was reassured that time and this is resolved as typical. He has had no neurologic deficit and feels that his speech is returned to baseline. His left neck incision is well-healed with the usual healing ridge. He has no carotid bruits bilaterally. There is no erythema and he is grossly intact neurologically.  I did fill out some form for him to get a brief on the on his [employer]. He will return to driving and a moderate physical activity immediately and I will defer strenuous activity for an additional 2 weeks. We will see him again in 6 months with carotid duplex

## 2011-12-20 NOTE — Addendum Note (Signed)
Addended by: Lorin Mercy K on: 12/20/2011 01:00 PM   Modules accepted: Orders

## 2012-06-19 ENCOUNTER — Ambulatory Visit: Payer: Medicare Other | Admitting: Neurosurgery

## 2012-06-19 ENCOUNTER — Other Ambulatory Visit: Payer: Medicare Other

## 2012-07-02 ENCOUNTER — Encounter: Payer: Self-pay | Admitting: Neurology

## 2012-07-10 ENCOUNTER — Other Ambulatory Visit: Payer: Medicare Other

## 2012-07-10 ENCOUNTER — Ambulatory Visit: Payer: Medicare Other | Admitting: Vascular Surgery

## 2012-07-23 ENCOUNTER — Encounter: Payer: Self-pay | Admitting: Vascular Surgery

## 2012-07-24 ENCOUNTER — Other Ambulatory Visit (INDEPENDENT_AMBULATORY_CARE_PROVIDER_SITE_OTHER): Payer: Medicare Other | Admitting: *Deleted

## 2012-07-24 ENCOUNTER — Encounter: Payer: Self-pay | Admitting: Vascular Surgery

## 2012-07-24 ENCOUNTER — Ambulatory Visit (INDEPENDENT_AMBULATORY_CARE_PROVIDER_SITE_OTHER): Payer: Medicare Other | Admitting: Vascular Surgery

## 2012-07-24 ENCOUNTER — Other Ambulatory Visit: Payer: Self-pay | Admitting: *Deleted

## 2012-07-24 DIAGNOSIS — Z48812 Encounter for surgical aftercare following surgery on the circulatory system: Secondary | ICD-10-CM

## 2012-07-24 DIAGNOSIS — I6529 Occlusion and stenosis of unspecified carotid artery: Secondary | ICD-10-CM

## 2012-07-24 NOTE — Progress Notes (Signed)
The patient presents today for 6 month followup after left carotid endarterectomy for symptomatic carotid disease. He had a left brain stroke affecting his speech prior to surgery. He does feel that he has a slight sensation of the common was speech occasionally this is not limiting to him. Otherwise he is at his baseline. He has had no new neurologic deficits  Past Medical History  Diagnosis Date  . Hyperlipidemia   . 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 Hypertension   . Anxiety   . Depression   . COPD (chronic obstructive pulmonary disease)   . Peripheral vascular disease   . Nocturia   . Hx of colonic polyps   . Sleep apnea     History  Substance Use Topics  . Smoking status: Former Smoker -- 1.00 packs/day for 50 years    Types: Cigarettes  . Smokeless tobacco: Not on file     Comment: quit smoking 08/2004  . Alcohol Use: 8.4 oz/week    14 Shots of liquor per week     Comment: 2-3 daily cocktails    History reviewed. No pertinent family history.  No Known Allergies  Current outpatient prescriptions:ALPRAZolam (XANAX) 0.5 MG tablet, Take 0.5 mg by mouth at bedtime as needed. For anxiety, Disp: , Rfl: ;  Ascorbic Acid (VITAMIN C PO), Take 2 tablets by mouth daily., Disp: , Rfl: ;  clopidogrel (PLAVIX) 75 MG tablet, Take 75 mg by mouth daily with breakfast., Disp: , Rfl: ;  lisinopril (PRINIVIL,ZESTRIL) 10 MG tablet, Take 10 mg by mouth daily., Disp: , Rfl:  Misc Natural Products (GLUCOSAMINE CHONDROITIN ADV PO), Take 1 tablet by mouth daily., Disp: , Rfl: ;  Multiple Vitamins-Minerals (MULTIVITAMINS THER. W/MINERALS) TABS, Take 1 tablet by mouth daily., Disp: , Rfl: ;  Omega-3 Fatty Acids (FISH OIL PO), Take 1 capsule by mouth daily., Disp: , Rfl: ;  simvastatin (ZOCOR) 40 MG tablet, Take 20 mg by mouth every evening., Disp: , Rfl: ;  aspirin EC 81 MG tablet, Take 81 mg by mouth daily., Disp: , Rfl:   BP 179/93  Pulse 57  Resp 18  Ht 5\' 9"  (1.753 m)  Wt 192 lb 3.2 oz (87.181 kg)   BMI 28.37 kg/m2  Body mass index is 28.37 kg/(m^2).       Physical exam left neck incision well healed with no evidence of bruit on the left or right neck. Normal pulse sedation. Neurologically he is grossly intact Heart regular rate and rhythm  Carotid duplex was reviewed with the patient and his wife present. This reveals widely patent left endarterectomy and no significant right carotid stenosis  Impression and plan: Stable followup after her left carotid endarterectomy for symptomatic disease. He has a resume full activity including [place] hunting this week with success. We will see him again in 6 months with repeat carotid duplex and he'll notify should he develop any neurologic deficits in the interim

## 2012-08-23 ENCOUNTER — Ambulatory Visit: Payer: Self-pay | Admitting: Nurse Practitioner

## 2012-09-04 ENCOUNTER — Encounter: Payer: Self-pay | Admitting: Neurology

## 2012-09-04 NOTE — Progress Notes (Signed)
Quick Note:  AHI of 4.3, 100% compliance at 6 cm water pressure CPAP with 2 cm EPR.   6 hours and 21 minutes nightly user time .   No changes necessary . Herve Haug, MD    ______

## 2012-09-19 ENCOUNTER — Ambulatory Visit (INDEPENDENT_AMBULATORY_CARE_PROVIDER_SITE_OTHER): Payer: Medicare Other | Admitting: Nurse Practitioner

## 2012-09-19 ENCOUNTER — Encounter: Payer: Self-pay | Admitting: Nurse Practitioner

## 2012-09-19 VITALS — BP 110/70 | HR 60 | Temp 98.2°F | Ht 69.0 in | Wt 184.5 lb

## 2012-09-19 DIAGNOSIS — I639 Cerebral infarction, unspecified: Secondary | ICD-10-CM

## 2012-09-19 DIAGNOSIS — I6350 Cerebral infarction due to unspecified occlusion or stenosis of unspecified cerebral artery: Secondary | ICD-10-CM

## 2012-09-19 NOTE — Progress Notes (Signed)
[facility]  PATIENT: Brandon Mendoza DOB: [birth year]   HISTORY FROM:patient REASON FOR VISIT: routine follow up  HISTORY OF PRESENT ILLNESS:  [name] is a 72 year-old right-handed Caucasian male is here for 6 mth stroke follow up. 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 He had presented [facility] after he noticed the previous night his tongue felt "fat" and he was speaking funny. His MRI of brain revealed left posterior frontal, anterior parietal acute infarct. He was then referred to [facility]. CTA of brain showed mild small vessel disease. Carotid Doppler showed left ICA stenosis 60-79%. During his hospitalization his blood pressure was 151-205/62-98. 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 CEA done on 12/09/2011. LDLs 65 on Lipitor, hemoglobin A1c 5.7%.  UPDATE 05/14/12:  he is doing well, he does not feel like his tongue is moving 100%, but does not feel that this is disabling. Denies any difficulty with speech. He is back exercising regularly, plays racquetball 3-5 times per week. No recurrent neurovascular symptoms. He is tolerating aspirin 81 mg and clopidogrel 75 mg well with mild bruising and no other side effects. His wife reports he snores and will occasionally take a daytime nap. His blood pressure is today 154/64. He does not checked at home.   UPDATE 09/19/12: Returns for followup today reports he's doing well. He denies any difficulty with speech and feels like his tongue is back to normal. He exercises regularly playing racquetball averaging 4 times a week. No recurrent neurovascular symptoms. He is tolerating clopidogrel 75 mg with no signs of bruising and no other side effects. Patient states he had repeat carotid Doppler study 2 months ago at [facility] which he states were good.  Reports 100% compliance  with his CPAP.  BP in office is 110/70 today.  REVIEW OF SYSTEMS: Full 14 system review of systems performed and notable only for: constitutional: n/a cardiovascular: n/a respiratory: snoring Endocrine: n/a ear/nose/throat: n/a  musculoskeletal: Joint pain  skin: n/a genitourinary: n/a allergy/immunology: n/a neurological: n/a  sleep: n/a psychiatric: n/a   ALLERGIES: No Known Allergies  HOME MEDICATIONS: Outpatient Prescriptions Prior to Visit  Medication Sig Dispense Refill  . ALPRAZolam (XANAX) 0.5 MG tablet Take 0.5 mg by mouth at bedtime as needed. For anxiety      . Ascorbic Acid (VITAMIN C PO) Take 2 tablets by mouth daily.      . clopidogrel (PLAVIX) 75 MG tablet Take 75 mg by mouth daily with breakfast.      . lisinopril (PRINIVIL,ZESTRIL) 10 MG tablet Take 10 mg by mouth daily.      . Misc Natural Products (GLUCOSAMINE CHONDROITIN ADV PO) Take 1 tablet by mouth daily.      . Multiple Vitamins-Minerals (MULTIVITAMINS THER. W/MINERALS) TABS Take 1 tablet by mouth daily.      . Omega-3 Fatty Acids (FISH OIL PO) Take 1 capsule by mouth daily.      . simvastatin (ZOCOR) 40 MG tablet Take 20 mg by mouth every evening.      Marland Kitchen aspirin EC 81 MG tablet Take 81 mg by mouth daily.       No facility-administered medications prior to visit.    PAST MEDICAL HISTORY: Past Medical History  Diagnosis Date  . Hyperlipidemia   . 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 Hypertension   . Anxiety   .  Depression   . COPD (chronic obstructive pulmonary disease)   . Peripheral vascular disease   . Nocturia   . Hx of colonic polyps   . Sleep apnea     on 6 cm, nasal pillow    PAST SURGICAL HISTORY: Past Surgical History  Procedure Laterality Date  . Tonsillectomy    . Hammer toe surgery  2004  . Shoulder arthroscopy w/ rotator cuff repair  2010  . Eye surgery      rt retina detachment  . Hernia repair  2006  . Endarterectomy  12/09/2011    Procedure: ENDARTERECTOMY CAROTID;   Surgeon: Larina Earthly, MD;  Location: [facility] OR;  Service: Vascular;  Laterality: Left;  . Carotid endarterectomy  12/09/11    Left cea    FAMILY HISTORY: Family History  Problem Relation Age of Onset  . Alzheimer's disease Father   . Diabetes      SOCIAL HISTORY: History   Social History  . Marital Status: Married    Spouse Name: [name]    Number of Children: 4  . Years of Education: College   Occupational History  . Retired    Social History Main Topics  . Smoking status: Former Smoker -- 1.00 packs/day for 50 years    Types: Cigarettes    Quit date: 08/26/2004  . Smokeless tobacco: Never Used     Comment: quit smoking 08/2004  . Alcohol Use: 8.4 oz/week    14 Shots of liquor per week     Comment: 2-3 daily cocktails  . Drug Use: No  . Sexually Active: Not on file   Other Topics Concern  . Not on file   Social History Narrative   Pt lives at home with his spouse.   Caffeine Use: 3 cups daily     PHYSICAL EXAM  Filed Vitals:   09/19/12 0958  BP: 110/70  Pulse: 60  Temp: 98.2 F (36.8 C)  TempSrc: Oral  Height: 5\' 9"  (1.753 m)  Weight: 184 lb 8 oz (83.689 kg)   Body mass index is 27.23 kg/(m^2).  GENERAL EXAM: Patient is in no distress, well developed and well groomed. HEAD: Symmetric facial features. EARS, NOSE, and THROAT: Normal.  NECK: Supple, no JVD RESPIRATORY: Lungs CTA. CARDIOVASCULAR: Regular rate and rhythm, no murmurs, no carotid bruits SKIN: No rash, no bruising  NEUROLOGIC: MENTAL STATUS: awake, alert and oriented to person, place and time, language fluent, comprehension intact, naming intact CRANIAL NERVE: no papilledema on fundoscopic exam, pupils equal and reactive to light, visual fields full to confrontation, extraocular muscles intact, no nystagmus, facial sensation and strength symmetric, uvula midline, shoulder shrug symmetric, tongue midline. MOTOR: normal bulk and tone, full strength in the BUE, BLE, fine finger movements  normal SENSORY: normal and symmetric to light touch, pinprick, temperature, vibration  COORDINATION: finger-nose-finger REFLEXES: deep tendon reflexes present and symmetric 2+,  GAIT/STATION: narrow based gait; able to walk on toes, heels and tandem; romberg is negative. No assistive device.   DIAGNOSTIC DATA (LABS, IMAGING, TESTING) - I reviewed patient records, labs, notes, testing and imaging myself where available.  Lab Results  Component Value Date   WBC 8.9 12/10/2011   HGB 14.4 12/10/2011   HCT 40.9 12/10/2011   MCV 88.7 12/10/2011   PLT 197 12/10/2011      Component Value Date/Time   NA 136 12/10/2011 0355   K 4.1 12/10/2011 0355   CL 103 12/10/2011 0355   CO2 24 12/10/2011 0355   GLUCOSE 134*  12/10/2011 0355   BUN 15 12/10/2011 0355   CREATININE 0.86 12/10/2011 0355   CALCIUM 8.7 12/10/2011 0355   PROT 7.3 11/29/2011 1519   ALBUMIN 4.2 11/29/2011 1519   AST 22 11/29/2011 1519   ALT 16 11/29/2011 1519   ALKPHOS 68 11/29/2011 1519   BILITOT 0.4 11/29/2011 1519   GFRNONAA 85* 12/10/2011 0355   GFRAA >90 12/10/2011 0355   Lab Results  Component Value Date   CHOL 148 11/30/2011   HDL 66 11/30/2011   LDLCALC 65 11/30/2011   TRIG 87 11/30/2011   CHOLHDL 2.2 11/30/2011   Lab Results  Component Value Date   HGBA1C 5.7* 11/30/2011   No results found for this basename: VITAMINB12   No results found for this basename: TSH   CT ANGIOGRAPHY HEAD  11/29/11 Mild small vessel disease type changes without CT evidence of large acute thrombotic infarct. Right frontal developmental venous anomaly otherwise no evidence of abnormal intracranial enhancing lesion. Very mild exophthalmos. Ectatic vertebral arteries. Mild narrowing of the right vertebral artery with slight ectasia of the left vertebral artery. Prominent basilar tip without aneurysm. Mild irregularity of the distal basilar artery without high-grade stenosis. Ectatic distal vertical cervical segment of the carotid arteries bilaterally. Calcified plaque  cavernous segment of the internal carotid artery bilaterally with mild to slightly moderate regions of narrowing and irregularity. Middle cerebral artery branch vessel irregularity bilaterally.  CAROTID DOPPERS 07/24/12:  Evidence of <40% Right internal carotid artery stenosis.  Widely patent left carotid endarterectomy  without signs of hyperplasia or restenosis.  CPAP 09/03/12: AHI of 4.3, 100% compliance at 6 cm water pressure CPAP with 2 cm EPR.  6 hours and 21 minutes nightly user time .    ASSESSMENT AND PLAN  [name] is a 72 year-old right-handed Caucasian male is here for 6 mth stroke follow up. 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 His vascular risk factors to include hypertension, hyperlipidemia and left ICA stenosis 60-79%. CEA done on 12/09/2011. He had a positive sleep study for OSA with an AHI of 58, and was titrated to 6 cm water pressure. Full compliance 100% with CPAP.   Continue clopidogrel 75 mg orally every day  for secondary stroke prevention and maintain strict control of hypertension with blood pressure goal below 130/90, diabetes with hemoglobin A1c goal below 6.5% and lipids with LDL cholesterol goal below 100 mg/dL.  Patient was encouraged to continue exercise and eat healthy diet..  Followup in 6 months.   Lavora Brisbon NP-C 09/19/2012, 11:07 AM  [facility] [address], [address] [place], [place] [address] [phone]

## 2012-09-19 NOTE — Patient Instructions (Signed)
Continue current medications. Return for follow up in 6 months.  STROKE/TIA INSTRUCTIONS SMOKING Cigarette smoking nearly doubles your risk of having a stroke & is the single most alterable risk factor  If you smoke or have smoked in the last 12 months, you are advised to quit smoking for your health.  Most of the excess cardiovascular risk related to smoking disappears within a year of stopping.  Ask you doctor about anti-smoking medications  [place] Quit Line: 1-800-QUIT NOW  Free Smoking Cessation Classes ([phone]  CHOLESTEROL Know your levels; limit fat & cholesterol in your diet  Lab Results  Component Value Date   CHOL 148 11/30/2011   HDL 66 11/30/2011   LDLCALC 65 11/30/2011   TRIG 87 11/30/2011   CHOLHDL 2.2 11/30/2011      Many patients benefit from treatment even if their cholesterol is at goal.  Goal: Total Cholesterol less than 160  Goal:  LDL less than 100  Goal:  HDL greater than 40  Goal:  Triglycerides less than 150  BLOOD PRESSURE American Stroke Association blood pressure target is less that 120/80 mm/Hg  Your discharge blood pressure is:  BP: 110/70 mmHg  Monitor your blood pressure  Limit your salt and alcohol intake  Many individuals will require more than one medication for high blood pressure  DIABETES (A1c is a blood sugar average for last 3 months) Goal A1c is under 7% (A1c is blood sugar average for last 3 months)  Diabetes: No known diagnosis of diabetes    Lab Results  Component Value Date   HGBA1C 5.7* 11/30/2011    Your A1c can be lowered with medications, healthy diet, and exercise.  Check your blood sugar as directed by your physician  Call your physician if you experience unexplained or low blood sugars.  PHYSICAL ACTIVITY/REHABILITATION Goal is 30 minutes at least 4 days per week    Activity decreases your risk of heart attack and stroke and makes your heart stronger.  It helps control your weight and blood pressure; helps you relax and  can improve your mood.  Participate in a regular exercise program.  Talk with your doctor about the best form of exercise for you (dancing, walking, swimming, cycling).  DIET/WEIGHT Goal is to maintain a healthy weight  Your height is:  Height: 5\' 9"  (175.3 cm) Your current weight is: Weight: 184 lb 8 oz (83.689 kg) Your body Mass Index (BMI) is:  BMI (Calculated): 27.3  Following the type of diet specifically designed for you will help prevent another stroke.  Your goal Body Mass Index (BMI) is 19-24.  Healthy food habits can help reduce 3 risk factors for stroke:  High cholesterol, hypertension, and excess weight.

## 2013-02-06 ENCOUNTER — Telehealth: Payer: Self-pay | Admitting: *Deleted

## 2013-02-06 NOTE — Telephone Encounter (Signed)
  Called patient lvm letting him know his appointment has been cancelled. And to call our office back to reschedule.

## 2013-02-19 ENCOUNTER — Ambulatory Visit: Payer: Medicare Other | Admitting: Vascular Surgery

## 2013-02-19 ENCOUNTER — Other Ambulatory Visit: Payer: Medicare Other

## 2013-02-19 ENCOUNTER — Other Ambulatory Visit (HOSPITAL_COMMUNITY): Payer: Medicare Other

## 2013-02-19 ENCOUNTER — Ambulatory Visit: Payer: Medicare Other | Admitting: Family

## 2013-03-12 ENCOUNTER — Encounter: Payer: Self-pay | Admitting: Family

## 2013-03-13 ENCOUNTER — Ambulatory Visit (INDEPENDENT_AMBULATORY_CARE_PROVIDER_SITE_OTHER): Payer: Medicare Other | Admitting: Family

## 2013-03-13 ENCOUNTER — Ambulatory Visit (HOSPITAL_COMMUNITY)
Admission: RE | Admit: 2013-03-13 | Discharge: 2013-03-13 | Disposition: A | Discharge status: Home / Self Care | Payer: Medicare Other | Source: Ambulatory Visit | Attending: Family | Admitting: Family

## 2013-03-13 ENCOUNTER — Encounter: Payer: Self-pay | Admitting: Family

## 2013-03-13 ENCOUNTER — Ambulatory Visit: Payer: Self-pay | Admitting: Nurse Practitioner

## 2013-03-13 DIAGNOSIS — R209 Unspecified disturbances of skin sensation: Secondary | ICD-10-CM

## 2013-03-13 DIAGNOSIS — R200 Anesthesia of skin: Secondary | ICD-10-CM | POA: Insufficient documentation

## 2013-03-13 DIAGNOSIS — Z48812 Encounter for surgical aftercare following surgery on the circulatory system: Secondary | ICD-10-CM

## 2013-03-13 DIAGNOSIS — I6529 Occlusion and stenosis of unspecified carotid artery: Secondary | ICD-10-CM

## 2013-03-13 DIAGNOSIS — I658 Occlusion and stenosis of other precerebral arteries: Secondary | ICD-10-CM | POA: Insufficient documentation

## 2013-03-13 NOTE — Progress Notes (Signed)
Established Carotid Patient  History of Present Illness  Brandon Mendoza is a 72 y.o. male  patient of Dr. Edilia Bo who is status post left carotid endarterectomy 12/09/11 for symptomatic carotid disease. 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   The patient denies amaurosis fugax or monocular blindness.  The patient  denies facial drooping.  Pt. denies hemiplegia.  The patient denies receptive or expressive aphasia.  Pt. denies extremity weakness. He admits to drinking 3 martinis daily. He plays an hour of racquetball daily. He denies claudication symptoms, denies non-healing wounds.  Patient denies New Medical or Surgical History.  Pt Diabetic: Yes Pt smoker: former smoker, quit in 2006  Pt meds include: Statin : Yes ASA: No, advised to stop by his neurologist since he takes Plavix and he was bruising easily. Other anticoagulants/antiplatelets: Plavix   Past Medical History  Diagnosis Date  . Hyperlipidemia   . 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 Hypertension   . Anxiety   . Depression   . COPD (chronic obstructive pulmonary disease)   . Peripheral vascular disease   . Nocturia   . Hx of colonic polyps   . Sleep apnea     on 6 cm, nasal pillow    Social History History  Substance Use Topics  . Smoking status: Former Smoker -- 1.00 packs/day for 50 years    Types: Cigarettes    Quit date: 08/26/2004  . Smokeless tobacco: Never Used     Comment: quit smoking 08/2004  . Alcohol Use: 8.4 oz/week    14 Shots of liquor per week     Comment: 2-3 daily cocktails    Family History Family History  Problem Relation Age of Onset  . Alzheimer's disease Father   . Diabetes      Surgical History Past Surgical History  Procedure Laterality Date  . Tonsillectomy    . Hammer toe surgery  2004  . Shoulder arthroscopy w/ rotator cuff repair  2010  . Eye  surgery      rt retina detachment  . Hernia repair  2006  . Endarterectomy  12/09/2011    Procedure: ENDARTERECTOMY CAROTID;  Surgeon: Larina Earthly, MD;  Location: [facility] OR;  Service: Vascular;  Laterality: Left;  . Carotid endarterectomy  12/09/11    Left cea    No Known Allergies  Current Outpatient Prescriptions  Medication Sig Dispense Refill  . ALPRAZolam (XANAX) 0.5 MG tablet Take 0.5 mg by mouth at bedtime as needed. For anxiety      . Ascorbic Acid (VITAMIN C PO) Take 2 tablets by mouth daily.      Marland Kitchen lisinopril (PRINIVIL,ZESTRIL) 10 MG tablet Take 10 mg by mouth daily.      . Misc Natural Products (GLUCOSAMINE CHONDROITIN ADV PO) Take 1 tablet by mouth daily.      . Multiple Vitamins-Minerals (MULTIVITAMINS THER. W/MINERALS) TABS Take 1 tablet by mouth daily.      . Omega-3 Fatty Acids (FISH OIL PO) Take 1 capsule by mouth daily.      . simvastatin (ZOCOR) 40 MG tablet Take 20 mg by mouth every evening.       No current facility-administered medications for this visit.    Review of Systems : See HPI for pertinent positives and negatives.  Physical Examination  Filed Vitals:   03/13/13 0923  BP: 130/73  Pulse:  53  Resp: 16   Filed Weights   03/13/13 0923  Weight: 185 lb (83.915 kg)   Body mass index is 27.31 kg/(m^2).  General: WDWN male in NAD GAIT: normal Eyes: PERRLA Pulmonary:  CTAB, Negative  Rales, Negative rhonchi, & Negative wheezing.  Cardiac: regular Rhythm ,  Negative Murmurs.  VASCULAR EXAM Carotid Bruits Left Right   Negative Negative    Aorta is not palpable. Radial pulses are 3+ palpable and equal.                                                                                                                            LE Pulses LEFT RIGHT       POPLITEAL  not palpable   not palpable       POSTERIOR TIBIAL   palpable    palpable        DORSALIS PEDIS      ANTERIOR TIBIAL  palpable   palpable     Gastrointestinal: soft, nontender, BS  WNL, no r/g,  negative masses.  Musculoskeletal: Negative muscle atrophy/wasting. M/S 5/5 throughout, Extremities without ischemic changes.  Neurologic: A&O X 3; Appropriate Affect ; SENSATION ;normal;  Speech is normal but loud. CN 2-12 intact, Pain and light touch intact in extremities, Motor exam as listed above.   Non-Invasive Vascular Imaging CAROTID DUPLEX 03/13/2013   Right ICA: <40% stenosis. Left ICA: patent CEA site.  These findings are Unchanged from previous exam.  Assessment: Brandon Mendoza is a 72 y.o. male who presents with asymptomatic minimal right ICA stenosis and widely patent left ICA which is the CEA site. The  ICA stenosis is  Unchanged from previous exam.  Plan: Follow-up in 1 year with Carotid Duplex scan.   I discussed in depth with the patient the nature of atherosclerosis, and emphasized the importance of maximal medical management including strict control of blood pressure, blood glucose, and lipid levels, obtaining regular exercise, and continued cessation of smoking.  The patient is aware that without maximal medical management the underlying atherosclerotic disease process will progress, limiting the benefit of any interventions. The patient was given information about stroke prevention and what symptoms should prompt the patient to seek immediate medical care. Thank you for allowing [place] to participate in this patient's care.  Charisse March, RN, MSN, FNP-C  [facility] Office: [phone]  Clinic Physician: Edilia Bo  03/13/2013 9:03 AM

## 2013-03-13 NOTE — Patient Instructions (Signed)
Stroke Prevention Some medical conditions and behaviors are associated with an increased chance of having a stroke. You may prevent a stroke by making healthy choices and managing medical conditions. Reduce your risk of having a stroke by:  Staying physically active. Get at least 30 minutes of activity on most or all days.  Not smoking. It may also be helpful to avoid exposure to secondhand smoke.  Limiting alcohol use. Moderate alcohol use is considered to be:  No more than 2 drinks per day for men.  No more than 1 drink per day for nonpregnant women.  Eating healthy foods.  Include 5 or more servings of fruits and vegetables a day.  Certain diets may be prescribed to address high blood pressure, high cholesterol, diabetes, or obesity.  Managing your cholesterol levels.  A low-saturated fat, low-trans fat, low-cholesterol, and high-fiber diet may control cholesterol levels.  Take any prescribed medicines to control cholesterol as directed by your caregiver.  Managing your diabetes.  A controlled-carbohydrate, controlled-sugar diet is recommended to manage diabetes.  Take any prescribed medicines to control diabetes as directed by your caregiver.  Controlling your high blood pressure (hypertension).  A low-salt (sodium), low-saturated fat, low-trans fat, and low-cholesterol diet is recommended to manage high blood pressure.  Take any prescribed medicines to control hypertension as directed by your caregiver.  Maintaining a healthy weight.  A reduced-calorie, low-sodium, low-saturated fat, low-trans fat, low-cholesterol diet is recommended to manage weight.  Stopping drug abuse.  Avoiding birth control pills.  Talk to your caregiver about the risks of taking birth control pills if you are over 35 years old, smoke, get migraines, or have ever had a blood clot.  Getting evaluated for sleep disorders (sleep apnea).  Talk to your caregiver about getting a sleep evaluation  if you snore a lot or have excessive sleepiness.  Taking medicines as directed by your caregiver.  For some people, aspirin or blood thinners (anticoagulants) are helpful in reducing the risk of forming abnormal blood clots that can lead to stroke. If you have the irregular heart rhythm of atrial fibrillation, you should be on a blood thinner unless there is a good reason you cannot take them.  Understand all your medicine instructions. SEEK IMMEDIATE MEDICAL CARE IF:   You have sudden weakness or numbness of the face, arm, or leg, especially on one side of the body.  You have sudden confusion.  You have trouble speaking (aphasia) or understanding.  You have sudden trouble seeing in one or both eyes.  You have sudden trouble walking.  You have dizziness.  You have a loss of balance or coordination.  You have a sudden, severe headache with no known cause.  You have new chest pain or an irregular heartbeat. Any of these symptoms may represent a serious problem that is an emergency. Do not wait to see if the symptoms will go away. Get medical help right away. Call your local emergency services (911 in [place] Do not drive yourself to the hospital. Document Released: 04/21/2004 Document Revised: 06/06/2011 Document Reviewed: 09/14/2012 [employer] Patient Information 2014 [employer].  

## 2013-03-25 ENCOUNTER — Ambulatory Visit: Payer: Medicare Other | Admitting: Nurse Practitioner

## 2013-04-17 ENCOUNTER — Encounter (INDEPENDENT_AMBULATORY_CARE_PROVIDER_SITE_OTHER): Payer: Self-pay

## 2013-04-17 ENCOUNTER — Encounter: Payer: Self-pay | Admitting: Nurse Practitioner

## 2013-04-17 ENCOUNTER — Ambulatory Visit (INDEPENDENT_AMBULATORY_CARE_PROVIDER_SITE_OTHER): Payer: Medicare Other | Admitting: Nurse Practitioner

## 2013-04-17 VITALS — BP 130/69 | HR 57 | Ht 68.5 in | Wt 190.0 lb

## 2013-04-17 DIAGNOSIS — I639 Cerebral infarction, unspecified: Secondary | ICD-10-CM

## 2013-04-17 DIAGNOSIS — Z9889 Other specified postprocedural states: Secondary | ICD-10-CM

## 2013-04-17 DIAGNOSIS — I6350 Cerebral infarction due to unspecified occlusion or stenosis of unspecified cerebral artery: Secondary | ICD-10-CM

## 2013-04-17 NOTE — Progress Notes (Signed)
PATIENT: Brandon Mendoza DOB: May 24, 1940   REASON FOR VISIT: follow up for stroke HISTORY FROM: patient  HISTORY OF PRESENT ILLNESS: Brandon Mendoza is a 73 year-old right-handed Caucasian male is here for 6 mth stroke follow up. 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 He had presented [facility] after he noticed the previous night his tongue felt "fat" and he was speaking funny. His MRI of brain revealed left posterior frontal, anterior parietal acute infarct. He was then referred to [facility] CTA of brain showed mild small vessel disease. Carotid Doppler showed left ICA stenosis 60-79%. During his hospitalization his blood pressure was 151-205/62-98. 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 CEA done on 12/09/2011. LDLs 65 on Lipitor, hemoglobin A1c 5.7%.   UPDATE 05/14/12: he is doing well, he does not feel like his tongue is moving 100%, but does not feel that this is disabling. Denies any difficulty with speech. He is back exercising regularly, plays racquetball 3-5 times per week. No recurrent neurovascular symptoms. He is tolerating aspirin 81 mg and clopidogrel 75 mg well with mild bruising and no other side effects. His wife reports he snores and will occasionally take a daytime nap. His blood pressure is today 154/64. He does not checked at home.   UPDATE 09/19/12 (LL): Returns for followup today reports he's doing well. He denies any difficulty with speech and feels like his tongue is back to normal. He exercises regularly playing racquetball averaging 4 times a week. No recurrent neurovascular symptoms. He is tolerating clopidogrel 75 mg with no signs of bruising and no other side effects. Patient states he had repeat carotid Doppler study 2 months ago at [facility] which he states were good. Reports 100% compliance with his CPAP. BP in office  is 110/70 today.  UPDATE 04/17/13 (LL):  Brandon Mendoza is here for follow up for stroke follow up.  He is doing well with no recurrent neurovascular symptoms.  He continues to be very active.  He is tolerating clopidogrel 75 mg with no signs of bruising and no other side effects.   REVIEW OF SYSTEMS: Full 14 system review of systems performed and notable only for:  apnea, snoring, bruise easily, drooling   ALLERGIES: No Known Allergies  HOME MEDICATIONS: Outpatient Prescriptions Prior to Visit  Medication Sig Dispense Refill  . ALPRAZolam (XANAX) 0.5 MG tablet Take 0.5 mg by mouth at bedtime as needed. For anxiety      . Ascorbic Acid (VITAMIN C PO) Take 2 tablets by mouth daily.      Marland Kitchen lisinopril (PRINIVIL,ZESTRIL) 10 MG tablet Take 10 mg by mouth daily.      . Misc Natural Products (GLUCOSAMINE CHONDROITIN ADV PO) Take 1 tablet by mouth daily.      . Multiple Vitamins-Minerals (MULTIVITAMINS THER. W/MINERALS) TABS Take 1 tablet by mouth daily.      . Omega-3 Fatty Acids (FISH OIL PO) Take 1 capsule by mouth daily.      . simvastatin (ZOCOR) 40 MG tablet Take 20 mg by mouth every evening.       No facility-administered medications prior to visit.   PHYSICAL EXAM  Filed Vitals:   04/17/13 1023  BP: 130/69  Pulse: 57  Height: 5' 8.5" (1.74 m)  Weight: 190 lb (86.183 kg)   Body mass index is 28.47 kg/(m^2).  GENERAL EXAM: Patient is in no  distress, well developed and well groomed.  HEAD: Symmetric facial features.  EARS, NOSE, and THROAT: Normal.  NECK: Supple, no JVD  RESPIRATORY: Lungs CTA.  CARDIOVASCULAR: Regular rate and rhythm, no murmurs, no carotid bruits  SKIN: No rash, no bruising   NEUROLOGIC:  MENTAL STATUS: awake, alert and oriented to person, place and time, language fluent, comprehension intact, naming intact  CRANIAL NERVE: no papilledema on fundoscopic exam, pupils equal and reactive to light, visual fields full to confrontation, extraocular muscles intact, no  nystagmus, facial sensation and strength symmetric, uvula midline, shoulder shrug symmetric, tongue midline.  MOTOR: normal bulk and tone, full strength in the BUE, BLE, fine finger movements normal  SENSORY: normal and symmetric to light touch, pinprick, temperature, vibration  COORDINATION: finger-nose-finger  REFLEXES: deep tendon reflexes present and symmetric 2+,  GAIT/STATION: narrow based gait; able to walk on toes, heels and tandem; romberg is negative. No assistive device.  DIAGNOSTIC DATA (LABS, IMAGING, TESTING) - I reviewed patient records, labs, notes, testing and imaging myself where available.  ASSESSMENT AND PLAN Brandon Mendoza is a 73 year-old right-handed Caucasian male is here for 6 mth stroke follow up. 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 His vascular risk factors to include hypertension, hyperlipidemia.  Left CEA done on 12/09/2011. He had a positive sleep study for OSA with an AHI of 58, and was titrated to 6 cm water pressure. Full compliance 100% with CPAP.   Continue clopidogrel 75 mg orally every day for secondary stroke prevention and maintain strict control of hypertension with blood pressure goal below 130/90, diabetes with hemoglobin A1c goal below 6.5% and lipids with LDL cholesterol goal below 100 mg/dL.  Patient was encouraged to continue exercise and eat healthy diet..  Followup in 1 year.  Meds ordered this encounter  Medications  . clopidogrel (PLAVIX) 75 MG tablet    Sig: Take 75 mg by mouth daily with breakfast.   Return in about 1 year (around 04/17/2014).  Ronal FearLYNN E. Tonia Avino, MSN, NP-C 04/17/2013, 1:08 PM [facility] [address], [address] [place], [place] [address] [phone]  Note: This document was prepared with digital dictation and possible smart phrase technology. Any transcriptional errors that result from this process are unintentional.

## 2013-04-17 NOTE — Patient Instructions (Addendum)
Continue clopidogrel 75 mg orally every day for secondary stroke prevention and maintain strict control of hypertension with blood pressure goal below 130/90, diabetes with hemoglobin A1c goal below 6.5% and lipids with LDL cholesterol goal below 100 mg/dL.  Patient was encouraged to continue exercise and eat healthy diet..  Followup in 1 year with Dr. Pearlean BrownieSethi.

## 2013-09-05 ENCOUNTER — Encounter: Payer: Self-pay | Admitting: Neurology

## 2014-04-01 ENCOUNTER — Encounter: Payer: Self-pay | Admitting: Vascular Surgery

## 2014-04-02 ENCOUNTER — Ambulatory Visit (HOSPITAL_COMMUNITY)
Admission: RE | Admit: 2014-04-02 | Discharge: 2014-04-02 | Disposition: A | Discharge status: Home / Self Care | Payer: Medicare Other | Source: Ambulatory Visit | Attending: Family | Admitting: Family

## 2014-04-02 ENCOUNTER — Encounter: Payer: Self-pay | Admitting: Family

## 2014-04-02 ENCOUNTER — Ambulatory Visit (INDEPENDENT_AMBULATORY_CARE_PROVIDER_SITE_OTHER): Payer: Medicare Other | Admitting: Family

## 2014-04-02 VITALS — BP 150/76 | HR 58 | Resp 16 | Ht 68.5 in | Wt 193.0 lb

## 2014-04-02 DIAGNOSIS — I6523 Occlusion and stenosis of bilateral carotid arteries: Secondary | ICD-10-CM | POA: Insufficient documentation

## 2014-04-02 DIAGNOSIS — Z48812 Encounter for surgical aftercare following surgery on the circulatory system: Secondary | ICD-10-CM | POA: Diagnosis not present

## 2014-04-02 DIAGNOSIS — Z87891 Personal history of nicotine dependence: Secondary | ICD-10-CM | POA: Diagnosis not present

## 2014-04-02 DIAGNOSIS — Z9889 Other specified postprocedural states: Secondary | ICD-10-CM

## 2014-04-02 NOTE — Progress Notes (Signed)
Established Carotid Patient   History of Present Illness  Brandon Mendoza is a 74 y.o. male patient of Dr. Edilia Bo who is status post left carotid endarterectomy 12/09/11 for symptomatic carotid disease. 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   The patient denies amaurosis fugax or monocular blindness. The patient denies facial drooping. Pt. denies hemiplegia. The patient denies receptive or expressive aphasia. Pt. denies extremity weakness. He admits to drinking 3 martinis daily. He plays an hour of racquetball daily. He denies claudication symptoms, denies non-healing wounds.  Patient denies New Medical or Surgical History.  Pt Diabetic: possible prediabetes as he states his sugar was 201 Pt smoker: former smoker, quit in 2006  Pt meds include: Statin : Yes ASA: No, advised to stop by his neurologist since he takes Plavix and he was bruising easily. Other anticoagulants/antiplatelets: Plavix  Past Medical History  Diagnosis Date  . Hyperlipidemia   . 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 Hypertension   . Anxiety   . Depression   . COPD (chronic obstructive pulmonary disease)   . Peripheral vascular disease   . Nocturia   . Hx of colonic polyps   . Sleep apnea     on 6 cm, nasal pillow  . Carotid artery occlusion     Social History History  Substance Use Topics  . Smoking status: Former Smoker -- 1.00 packs/day for 50 years    Types: Cigarettes    Quit date: 08/26/2004  . Smokeless tobacco: Never Used     Comment: quit smoking 08/2004  . Alcohol Use: 8.4 oz/week    14 Shots of liquor per week     Comment: 2-3 daily cocktails    Family History Family History  Problem Relation Age of Onset  . Alzheimer's disease Father   . Diabetes      Surgical History Past Surgical History  Procedure Laterality Date  . Tonsillectomy    . Hammer toe  surgery  2004  . Shoulder arthroscopy w/ rotator cuff repair  2010  . Eye surgery      rt retina detachment  . Hernia repair  2006  . Endarterectomy  12/09/2011    Procedure: ENDARTERECTOMY CAROTID;  Surgeon: Larina Earthly, MD;  Location: [facility] OR;  Service: Vascular;  Laterality: Left;  . Carotid endarterectomy  12/09/11    Left cea    No Known Allergies  Current Outpatient Prescriptions  Medication Sig Dispense Refill  . ALPRAZolam (XANAX) 0.5 MG tablet Take 0.5 mg by mouth at bedtime as needed. For anxiety    . Ascorbic Acid (VITAMIN C PO) Take 2 tablets by mouth daily.    . clopidogrel (PLAVIX) 75 MG tablet Take 75 mg by mouth daily with breakfast.    . Misc Natural Products (GLUCOSAMINE CHONDROITIN ADV PO) Take 1 tablet by mouth daily.    . Multiple Vitamins-Minerals (MULTIVITAMINS THER. W/MINERALS) TABS Take 1 tablet by mouth daily.    . Omega-3 Fatty Acids (FISH OIL PO) Take 1 capsule by mouth daily.    . simvastatin (ZOCOR) 40 MG tablet Take 20 mg by mouth every evening.    Marland Kitchen lisinopril (PRINIVIL,ZESTRIL) 10 MG tablet Take 10 mg by mouth daily.     No current facility-administered medications for this visit.    Review of Systems : See HPI for pertinent positives and negatives.  Physical Examination  Filed Vitals:   04/02/14 0929 04/02/14 0933  BP: 148/72 150/76  Pulse: 57 58  Resp:  16  Height:  5' 8.5" (1.74 m)  Weight:  193 lb (87.544 kg)  SpO2:  97%   Body mass index is 28.92 kg/(m^2).  General: WDWN male in NAD GAIT: normal Eyes: PERRLA Pulmonary: CTAB, Negative Rales, Negative rhonchi, & Negative wheezing.  Cardiac: regular Rhythm, no murmur appreciated.   VASCULAR EXAM Carotid Bruits Left Right   Negative Negative   Aorta is not palpable. Radial pulses are 2+ palpable and equal.      LE Pulses LEFT RIGHT    POPLITEAL not palpable  not palpable   POSTERIOR TIBIAL  palpable   palpable    DORSALIS PEDIS  ANTERIOR TIBIAL palpable  palpable     Gastrointestinal: soft, nontender, BS WNL, no r/g,no palpable masses.  Musculoskeletal: Negative muscle atrophy/wasting. M/S 5/5 throughout, Extremities without ischemic changes. 2 Neurologic: A&O X 3; Appropriate Affect,  Speech is normal but loud. CN 2-12 intact, Pain and light touch intact in extremities, Motor exam as listed above.    Non-Invasive Vascular Imaging CAROTID DUPLEX 04/02/2014   Right ICA: <40% stenosis. Left ICA: patent CEA site.  These findings are Unchanged from previous exam.   Assessment: Brandon Mendoza is a 74 y.o. male who is status post left carotid endarterectomy 12/09/11 for symptomatic carotid disease. 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 Today's carotid Duplex reveals minimal right ICA stenosis and a patent left ICA which is the CEA site. The  ICA stenosis is  Unchanged from previous exam.  Plan: Follow-up in 1 year with Carotid Duplex.   I discussed in depth with the patient the nature of atherosclerosis, and emphasized the importance of maximal medical management including strict control of blood pressure, blood glucose, and lipid levels, obtaining regular exercise, and continued cessation of smoking.  The patient is aware that without maximal medical management the underlying atherosclerotic disease process will progress, limiting the benefit of any interventions. The patient was given information about stroke prevention and what symptoms should prompt the patient to seek immediate medical care. Thank you for allowing us to participate in this patient's care.  Charisse MarchSuzanne Nickel, RN, MSN, FNP-C [facility] [place] Office:  [phone]  Clinic Physician: Edilia BoDickson  04/02/2014 9:59 AM

## 2014-04-02 NOTE — Patient Instructions (Signed)
Stroke Prevention Some medical conditions and behaviors are associated with an increased chance of having a stroke. You may prevent a stroke by making healthy choices and managing medical conditions. HOW CAN I REDUCE MY RISK OF HAVING A STROKE?   Stay physically active. Get at least 30 minutes of activity on most or all days.  Do not smoke. It may also be helpful to avoid exposure to secondhand smoke.  Limit alcohol use. Moderate alcohol use is considered to be:  No more than 2 drinks per day for men.  No more than 1 drink per day for nonpregnant women.  Eat healthy foods. This involves:  Eating 5 or more servings of fruits and vegetables a day.  Making dietary changes that address high blood pressure (hypertension), high cholesterol, diabetes, or obesity.  Manage your cholesterol levels.  Making food choices that are high in fiber and low in saturated fat, trans fat, and cholesterol may control cholesterol levels.  Take any prescribed medicines to control cholesterol as directed by your health care provider.  Manage your diabetes.  Controlling your carbohydrate and sugar intake is recommended to manage diabetes.  Take any prescribed medicines to control diabetes as directed by your health care provider.  Control your hypertension.  Making food choices that are low in salt (sodium), saturated fat, trans fat, and cholesterol is recommended to manage hypertension.  Take any prescribed medicines to control hypertension as directed by your health care provider.  Maintain a healthy weight.  Reducing calorie intake and making food choices that are low in sodium, saturated fat, trans fat, and cholesterol are recommended to manage weight.  Stop drug abuse.  Avoid taking birth control pills.  Talk to your health care provider about the risks of taking birth control pills if you are over 35 years old, smoke, get migraines, or have ever had a blood clot.  Get evaluated for sleep  disorders (sleep apnea).  Talk to your health care provider about getting a sleep evaluation if you snore a lot or have excessive sleepiness.  Take medicines only as directed by your health care provider.  For some people, aspirin or blood thinners (anticoagulants) are helpful in reducing the risk of forming abnormal blood clots that can lead to stroke. If you have the irregular heart rhythm of atrial fibrillation, you should be on a blood thinner unless there is a good reason you cannot take them.  Understand all your medicine instructions.  Make sure that other conditions (such as anemia or atherosclerosis) are addressed. SEEK IMMEDIATE MEDICAL CARE IF:   You have sudden weakness or numbness of the face, arm, or leg, especially on one side of the body.  Your face or eyelid droops to one side.  You have sudden confusion.  You have trouble speaking (aphasia) or understanding.  You have sudden trouble seeing in one or both eyes.  You have sudden trouble walking.  You have dizziness.  You have a loss of balance or coordination.  You have a sudden, severe headache with no known cause.  You have new chest pain or an irregular heartbeat. Any of these symptoms may represent a serious problem that is an emergency. Do not wait to see if the symptoms will go away. Get medical help at once. Call your local emergency services (911 in [place] Do not drive yourself to the hospital. Document Released: 04/21/2004 Document Revised: 07/29/2013 Document Reviewed: 09/14/2012 Exit[employer] Patient Information 2015 [employer]. This information is not intended to replace advice given   to you by your health care provider. Make sure you discuss any questions you have with your health care provider.  

## 2014-04-02 NOTE — Addendum Note (Signed)
Addended by: Sharee PimpleMCCHESNEY, MARILYN K on: 04/02/2014 03:43 PM   Modules accepted: Orders

## 2014-04-04 DIAGNOSIS — S161XXA Strain of muscle, fascia and tendon at neck level, initial encounter: Secondary | ICD-10-CM | POA: Insufficient documentation

## 2014-04-10 ENCOUNTER — Ambulatory Visit (INDEPENDENT_AMBULATORY_CARE_PROVIDER_SITE_OTHER): Payer: Medicare Other | Admitting: Neurology

## 2014-04-10 ENCOUNTER — Encounter: Payer: Self-pay | Admitting: Neurology

## 2014-04-10 VITALS — BP 119/69 | HR 66 | Ht 68.5 in | Wt 194.0 lb

## 2014-04-10 DIAGNOSIS — Z9889 Other specified postprocedural states: Secondary | ICD-10-CM | POA: Insufficient documentation

## 2014-04-10 DIAGNOSIS — I63131 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: Secondary | ICD-10-CM

## 2014-04-10 DIAGNOSIS — F419 Anxiety disorder, unspecified: Secondary | ICD-10-CM

## 2014-04-10 DIAGNOSIS — I6523 Occlusion and stenosis of bilateral carotid arteries: Secondary | ICD-10-CM

## 2014-04-10 DIAGNOSIS — I10 Essential (primary) hypertension: Secondary | ICD-10-CM

## 2014-04-10 MED ORDER — SERTRALINE HCL 50 MG PO TABS: 50.0000 mg | ORAL_TABLET | Freq: Every day | ORAL | Status: DC

## 2014-04-10 NOTE — Progress Notes (Signed)
PATIENT: Brandon Mendoza DOB: [birth year]   REASON FOR VISIT: follow up for stroke HISTORY FROM: patient  HISTORY OF PRESENT ILLNESS: Brandon Mendoza is a 74 year-old right-handed Caucasian male is here for 6 mth stroke follow up. 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 He had presented [facility] after he noticed the previous night his tongue felt "fat" and he was speaking funny. His MRI of brain revealed left posterior frontal, anterior parietal acute infarct. He was then referred to [facility]. CTA of brain showed mild small vessel disease. Carotid Doppler showed left ICA stenosis 60-79%. During his hospitalization his blood pressure was 151-205/62-98. 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 CEA done on 12/09/2011. LDLs 65 on Lipitor, hemoglobin A1c 5.7%.   UPDATE 05/14/12: he is doing well, he does not feel like his tongue is moving 100%, but does not feel that this is disabling. Denies any difficulty with speech. He is back exercising regularly, plays racquetball 3-5 times per week. No recurrent neurovascular symptoms. He is tolerating aspirin 81 mg and clopidogrel 75 mg well with mild bruising and no other side effects. His wife reports he snores and will occasionally take a daytime nap. His blood pressure is today 154/64. He does not checked at home.   UPDATE 09/19/12 (LL): Returns for followup today reports he's doing well. He denies any difficulty with speech and feels like his tongue is back to normal. He exercises regularly playing racquetball averaging 4 times a week. No recurrent neurovascular symptoms. He is tolerating clopidogrel 75 mg with no signs of bruising and no other side effects. Patient states he had repeat carotid Doppler study 2 months ago at [facility] which he states were good. Reports 100% compliance with his CPAP. BP in office  is 110/70 today.  UPDATE 04/17/13 (LL):  Brandon Mendoza is here for follow up for stroke follow up.  He is doing well with no recurrent neurovascular symptoms.  He continues to be very active.  He is tolerating clopidogrel 75 mg with no signs of bruising and no other side effects.   Interval history During the interval time, pt continues doing well. No recurrent stroke like symptoms. He had recent vascular surgery follow up and CUS showed bilateral no significant stenosis. He will have another CUS in a year.   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 He is taking Xanax 0.5mg  Qhs but he would like something else to help him for the short temper and anxiety.   REVIEW OF SYSTEMS: Full 14 system review of systems performed and notable only for:  apnea, snoring, bruise easily, drooling   ALLERGIES: No Known Allergies  HOME MEDICATIONS: Outpatient Prescriptions Prior to Visit  Medication Sig Dispense Refill  . ALPRAZolam (XANAX) 0.5 MG tablet Take 0.5 mg by mouth at bedtime as needed. For anxiety    . Ascorbic Acid (VITAMIN C PO) Take 2 tablets by mouth daily.    . clopidogrel (PLAVIX) 75 MG tablet Take 75 mg by mouth daily with breakfast.    . Misc Natural Products (GLUCOSAMINE CHONDROITIN ADV PO) Take 1 tablet by mouth daily.    . Multiple Vitamins-Minerals (MULTIVITAMINS THER. W/MINERALS) TABS Take 1 tablet by mouth daily.    . Omega-3 Fatty Acids (FISH OIL PO) Take 1 capsule by mouth daily.    Marland Kitchen  simvastatin (ZOCOR) 40 MG tablet Take 20 mg by mouth every evening.    Marland Kitchen lisinopril (PRINIVIL,ZESTRIL) 10 MG tablet Take 10 mg by mouth daily.     No facility-administered medications prior to visit.   PHYSICAL EXAM  Filed Vitals:   04/10/14 1414  BP: 119/69  Pulse: 66  Height: 5' 8.5" (1.74 m)  Weight: 194 lb (87.998 kg)   Body mass index is 29.07 kg/(m^2).  GENERAL EXAM: Patient is in no distress, well developed and well  groomed.  HEAD: Symmetric facial features.  EARS, NOSE, and THROAT: Normal.  NECK: Supple, no JVD  RESPIRATORY: Lungs CTA.  CARDIOVASCULAR: Regular rate and rhythm, no murmurs, no carotid bruits  SKIN: No rash, no bruising   NEUROLOGIC:  MENTAL STATUS: awake, alert and oriented to person, place and time, language fluent, comprehension intact, naming intact  CRANIAL NERVE: no papilledema on fundoscopic exam, pupils equal and reactive to light, visual fields full to confrontation, extraocular muscles intact, no nystagmus, facial sensation and strength symmetric, uvula midline, shoulder shrug symmetric, tongue midline.  MOTOR: normal bulk and tone, full strength in the BUE, BLE, fine finger movements normal  SENSORY: normal and symmetric to light touch, pinprick, temperature, vibration  COORDINATION: finger-nose-finger  REFLEXES: deep tendon reflexes present and symmetric 2+,  GAIT/STATION: narrow based gait; able to walk on toes, heels and tandem; romberg is negative. No assistive device.  DIAGNOSTIC DATA (LABS, IMAGING, TESTING) - I reviewed patient records, labs, notes, testing and imaging myself where available.  ASSESSMENT AND PLAN Brandon Mendoza is a 74 year-old right-handed Caucasian male is here for 6 mth stroke follow up. 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 His vascular risk factors to include hypertension, hyperlipidemia.  Left CEA done on 12/09/2011. He had a positive sleep study for OSA with an AHI of 58, and was titrated to 6 cm water pressure. Full compliance 100% with CPAP. Followed up with vascular surgery so far and bilateral ICA patent with no significant stenosis.  He complains of anxiety and short temper, on Xanax 0.5mg  Qhs. Will let him try zoloft for anxiety.  - Continue clopidogrel 75 mg and Zocor  orally every day for secondary stroke prevention - Follow up with your primary care physician  for stroke risk factor modification. Recommend maintain blood pressure goal <130/80, diabetes with hemoglobin A1c goal below 6.5% and lipids with LDL cholesterol goal below 70 mg/dL.  - trial of zoloft  daily for anxiety, titrate up to  if needed. - follow up with vascular surgery - check BP at home - RTC PRN   Meds ordered this encounter  Medications  . lisinopril (PRINIVIL,ZESTRIL) 10 MG tablet    Sig: Take 10 mg by mouth.  . simvastatin (ZOCOR) 20 MG tablet    Sig: Take 20 mg by mouth.  . sertraline (ZOLOFT) 50 MG tablet    Sig: Take 1 tablet (50 mg total) by mouth daily.    Dispense:  90 tablet    Refill:  3    Patient Instructions  - continue plavix and zocor for stroke prevention - check BP at home - Follow up with your primary care physician for stroke risk factor modification. Recommend maintain blood pressure goal <130/80, diabetes with hemoglobin A1c goal below 6.5% and lipids with LDL cholesterol goal below 70 mg/dL.  - will try zoloft  daily for helping you anxiety, can titrate up to  a day. - follow  up as needed.

## 2014-04-10 NOTE — Patient Instructions (Signed)
-   continue plavix and zocor for stroke prevention - check BP at home - Follow up with your primary care physician for stroke risk factor modification. Recommend maintain blood pressure goal <130/80, diabetes with hemoglobin A1c goal below 6.5% and lipids with LDL cholesterol goal below 70 mg/dL.  - will try zoloft 50mg  daily for helping you anxiety, can titrate up to 100mg  a day. - follow up as needed.

## 2014-04-17 ENCOUNTER — Ambulatory Visit: Payer: BLUE CROSS/BLUE SHIELD | Admitting: Neurology

## 2015-04-03 ENCOUNTER — Encounter: Payer: Self-pay | Admitting: Family

## 2015-04-07 ENCOUNTER — Ambulatory Visit (HOSPITAL_COMMUNITY)
Admission: RE | Admit: 2015-04-07 | Discharge: 2015-04-07 | Disposition: A | Discharge status: Home / Self Care | Payer: Medicare Other | Source: Ambulatory Visit | Attending: Family | Admitting: Family

## 2015-04-07 ENCOUNTER — Ambulatory Visit (INDEPENDENT_AMBULATORY_CARE_PROVIDER_SITE_OTHER): Payer: Medicare Other | Admitting: Family

## 2015-04-07 ENCOUNTER — Encounter: Payer: Self-pay | Admitting: Family

## 2015-04-07 VITALS — BP 133/81 | HR 61 | Temp 97.2°F | Resp 16 | Ht 69.0 in | Wt 195.0 lb

## 2015-04-07 DIAGNOSIS — I10 Essential (primary) hypertension: Secondary | ICD-10-CM | POA: Insufficient documentation

## 2015-04-07 DIAGNOSIS — Z9889 Other specified postprocedural states: Secondary | ICD-10-CM

## 2015-04-07 DIAGNOSIS — I6522 Occlusion and stenosis of left carotid artery: Secondary | ICD-10-CM | POA: Diagnosis not present

## 2015-04-07 DIAGNOSIS — Z48812 Encounter for surgical aftercare following surgery on the circulatory system: Secondary | ICD-10-CM | POA: Diagnosis present

## 2015-04-07 DIAGNOSIS — E785 Hyperlipidemia, unspecified: Secondary | ICD-10-CM | POA: Insufficient documentation

## 2015-04-07 DIAGNOSIS — I6523 Occlusion and stenosis of bilateral carotid arteries: Secondary | ICD-10-CM

## 2015-04-07 NOTE — Patient Instructions (Signed)
Stroke Prevention Some medical conditions and behaviors are associated with an increased chance of having a stroke. You may prevent a stroke by making healthy choices and managing medical conditions. HOW CAN I REDUCE MY RISK OF HAVING A STROKE?   Stay physically active. Get at least 30 minutes of activity on most or all days.  Do not smoke. It may also be helpful to avoid exposure to secondhand smoke.  Limit alcohol use. Moderate alcohol use is considered to be:  No more than 2 drinks per day for men.  No more than 1 drink per day for nonpregnant women.  Eat healthy foods. This involves:  Eating 5 or more servings of fruits and vegetables a day.  Making dietary changes that address high blood pressure (hypertension), high cholesterol, diabetes, or obesity.  Manage your cholesterol levels.  Making food choices that are high in fiber and low in saturated fat, trans fat, and cholesterol may control cholesterol levels.  Take any prescribed medicines to control cholesterol as directed by your health care provider.  Manage your diabetes.  Controlling your carbohydrate and sugar intake is recommended to manage diabetes.  Take any prescribed medicines to control diabetes as directed by your health care provider.  Control your hypertension.  Making food choices that are low in salt (sodium), saturated fat, trans fat, and cholesterol is recommended to manage hypertension.  Ask your health care provider if you need treatment to lower your blood pressure. Take any prescribed medicines to control hypertension as directed by your health care provider.  If you are 18-39 years of age, have your blood pressure checked every 3-5 years. If you are 40 years of age or older, have your blood pressure checked every year.  Maintain a healthy weight.  Reducing calorie intake and making food choices that are low in sodium, saturated fat, trans fat, and cholesterol are recommended to manage  weight.  Stop drug abuse.  Avoid taking birth control pills.  Talk to your health care provider about the risks of taking birth control pills if you are over 35 years old, smoke, get migraines, or have ever had a blood clot.  Get evaluated for sleep disorders (sleep apnea).  Talk to your health care provider about getting a sleep evaluation if you snore a lot or have excessive sleepiness.  Take medicines only as directed by your health care provider.  For some people, aspirin or blood thinners (anticoagulants) are helpful in reducing the risk of forming abnormal blood clots that can lead to stroke. If you have the irregular heart rhythm of atrial fibrillation, you should be on a blood thinner unless there is a good reason you cannot take them.  Understand all your medicine instructions.  Make sure that other conditions (such as anemia or atherosclerosis) are addressed. SEEK IMMEDIATE MEDICAL CARE IF:   You have sudden weakness or numbness of the face, arm, or leg, especially on one side of the body.  Your face or eyelid droops to one side.  You have sudden confusion.  You have trouble speaking (aphasia) or understanding.  You have sudden trouble seeing in one or both eyes.  You have sudden trouble walking.  You have dizziness.  You have a loss of balance or coordination.  You have a sudden, severe headache with no known cause.  You have new chest pain or an irregular heartbeat. Any of these symptoms may represent a serious problem that is an emergency. Do not wait to see if the symptoms will   go away. Get medical help at once. Call your local emergency services (911 in [place] Do not drive yourself to the hospital.   This information is not intended to replace advice given to you by your health care provider. Make sure you discuss any questions you have with your health care provider.   Document Released: 04/21/2004 Document Revised: 04/04/2014 Document Reviewed:  09/14/2012 Elsevier Interactive Patient Education 2016 Elsevier Inc.  

## 2015-04-07 NOTE — Addendum Note (Signed)
Addended by: Adria DillELDRIDGE-LEWIS, Elven Laboy L on: 04/07/2015 02:12 PM   Modules accepted: Orders

## 2015-04-07 NOTE — Progress Notes (Signed)
Chief Complaint: Extracranial Carotid Artery Stenosis   History of Present Illness  Brandon Mendoza is a 75 y.o. male patient of Dr. Edilia Bo who is status post left carotid endarterectomy 12/09/11 for symptomatic carotid disease. 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   The patient denies a history of amaurosis fugax or monocular blindness, unilateral facial drooping or hemiparesis.   He admits to drinking 3 martinis daily. He plays an hour of racquetball daily. He denies claudication symptoms with walking, denies non-healing wounds.  Patient denies New Medical or Surgical History.  Pt Diabetic: possible prediabetes as he states his sugar was 201 Pt smoker: former smoker, quit in 2006  Pt meds include: Statin : Yes ASA: No, advised to stop by his neurologist since he takes Plavix and he was bruising easily. Other anticoagulants/antiplatelets: Plavix   Past Medical History  Diagnosis Date  . Hyperlipidemia   . Stroke (HCC) 05/01/2011    ischemic  . Hypertension   . Anxiety   . Depression   . COPD (chronic obstructive pulmonary disease) (HCC)   . Peripheral vascular disease (HCC)   . Nocturia   . Hx of colonic polyps   . Sleep apnea     on 6 cm, nasal pillow  . Carotid artery occlusion     Social History Social History  Substance Use Topics  . Smoking status: Former Smoker -- 1.00 packs/day for 50 years    Types: Cigarettes    Quit date: 08/26/2004  . Smokeless tobacco: Never Used     Comment: quit smoking 08/2004  . Alcohol Use: 8.4 oz/week    14 Shots of liquor per week     Comment: 2-3 daily cocktails    Family History Family History  Problem Relation Age of Onset  . Alzheimer's disease Father   . Diabetes      Surgical History Past Surgical History  Procedure Laterality Date  . Tonsillectomy    . Hammer toe surgery  2004  .  Shoulder arthroscopy w/ rotator cuff repair  2010  . Eye surgery      rt retina detachment  . Hernia repair  2006  . Endarterectomy  12/09/2011    Procedure: ENDARTERECTOMY CAROTID;  Surgeon: Larina Earthly, MD;  Location: [facility] OR;  Service: Vascular;  Laterality: Left;  . Carotid endarterectomy  12/09/11    Left cea    No Known Allergies  Current Outpatient Prescriptions  Medication Sig Dispense Refill  . ALPRAZolam (XANAX) 0.5 MG tablet Take 0.5 mg by mouth at bedtime as needed. For anxiety    . Ascorbic Acid (VITAMIN C PO) Take 2 tablets by mouth daily.    . clopidogrel (PLAVIX) 75 MG tablet Take 75 mg by mouth daily with breakfast.    . lisinopril (PRINIVIL,ZESTRIL) 10 MG tablet Take 10 mg by mouth.    . Misc Natural Products (GLUCOSAMINE CHONDROITIN ADV PO) Take 1 tablet by mouth daily.    . Multiple Vitamins-Minerals (MULTIVITAMINS THER. W/MINERALS) TABS Take 1 tablet by mouth daily.    . Omega-3 Fatty Acids (FISH OIL PO) Take 1 capsule by mouth daily.    . sertraline (ZOLOFT) 50 MG tablet Take 1 tablet (50 mg total) by mouth daily. 90 tablet 3  . simvastatin (ZOCOR) 20 MG tablet Take 20 mg by mouth.    . simvastatin (ZOCOR) 40 MG tablet Take 20  mg by mouth every evening. Reported on 04/07/2015     No current facility-administered medications for this visit.    Review of Systems : See HPI for pertinent positives and negatives.  Physical Examination  Filed Vitals:   04/07/15 1217 04/07/15 1219  BP: 133/76 133/81  Pulse: 59 61  Temp:  97.2 F (36.2 C)  TempSrc:  Oral  Resp:  16  Height:  5\' 9"  (1.753 m)  Weight:  195 lb (88.451 kg)  SpO2:  95%   Body mass index is 28.78 kg/(m^2).  General: WDWN male in NAD GAIT: normal Eyes: PERRLA Pulmonary: CTAB, no rales, rhonchi, or wheezing.  Cardiac: regular rhythm, no murmur appreciated.   VASCULAR EXAM Carotid Bruits Left Right   Negative Negative   Aorta is not palpable. Radial pulses are 2+ palpable and  equal.      LE Pulses LEFT RIGHT   POPLITEAL not palpable  not palpable   POSTERIOR TIBIAL  palpable   palpable    DORSALIS PEDIS  ANTERIOR TIBIAL palpable  palpable     Gastrointestinal: soft, nontender, BS WNL, no r/g,no palpable masses.  Musculoskeletal: No muscle atrophy/wasting. M/S 5/5 throughout, Extremities without ischemic changes.  Neurologic: A&O X 3; Appropriate Affect,  Speech is normal, CN 2-12 intact, Pain and light touch intact in extremities, Motor exam as listed above.          Non-Invasive Vascular Imaging CAROTID DUPLEX 04/07/2015   Right ICA: 1 - 39 % stenosis. Left ICA: no restenosis of CEA site. No significant change compared to 04/02/14 exam.     Assessment: Brandon Mendoza is a 75 y.o. male  who is status post left carotid endarterectomy 12/09/11 for symptomatic carotid disease. 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   Today's carotid duplex suggests 1-39% right ICA stenosis and no restenosis of left CEA site. No significant change compared to 04/02/14 exam.   Plan: Follow-up in 1 year with Carotid Duplex scan.   I discussed in depth with the patient the nature of atherosclerosis, and emphasized the importance of maximal medical management including strict control of blood pressure, blood glucose, and lipid levels, obtaining regular exercise, and continued cessation of smoking.  The patient is aware that without maximal medical management the underlying atherosclerotic disease process will progress, limiting the benefit of any interventions. The patient was given information about stroke prevention and what symptoms should prompt the patient to seek  immediate medical care. Thank you for allowing us to participate in this patient's care.  Charisse MarchSuzanne Natsuko Kelsay, RN, MSN, FNP-C [facility] [place] Office: [phone]  Clinic Physician: Early  04/07/2015 12:39 PM

## 2015-04-08 ENCOUNTER — Ambulatory Visit: Payer: Medicare Other | Admitting: Family

## 2015-04-08 ENCOUNTER — Other Ambulatory Visit (HOSPITAL_COMMUNITY): Payer: Medicare Other

## 2015-05-10 DIAGNOSIS — F325 Major depressive disorder, single episode, in full remission: Secondary | ICD-10-CM | POA: Insufficient documentation

## 2015-05-10 DIAGNOSIS — F339 Major depressive disorder, recurrent, unspecified: Secondary | ICD-10-CM | POA: Insufficient documentation

## 2015-05-12 DIAGNOSIS — F411 Generalized anxiety disorder: Secondary | ICD-10-CM | POA: Insufficient documentation

## 2015-08-19 ENCOUNTER — Other Ambulatory Visit: Payer: Self-pay | Admitting: Neurology

## 2015-08-26 ENCOUNTER — Other Ambulatory Visit: Payer: Self-pay | Admitting: Neurology

## 2015-08-28 ENCOUNTER — Telehealth: Payer: Self-pay | Admitting: Neurology

## 2015-08-28 ENCOUNTER — Other Ambulatory Visit: Payer: Self-pay | Admitting: Neurology

## 2015-08-28 NOTE — Telephone Encounter (Signed)
Joselyn Glassmanyler, Pharmacist/[facility] [phone] called to check status of refill request that was sent 5/24, 5/30 and again today. Mauricio Podvised Tyler, refill request was denied on the 24th "pt needs to call PCP for future refills, last seen 2016", refused on 31st "pt needs appointment". Joselyn Glassmanyler asks, if nurse calls the patient to advise them of this? Also states patient is in the store now. Would like to know if Dr. Roda ShuttersXu could do an emergency supply of this medication. Please call to advise.

## 2015-08-28 NOTE — Telephone Encounter (Signed)
Rn Agricultural engineercall Brandon Mendoza pharmacist at [facility]. Rn explain patient was discharge to only follow up as needed with Dr. Roda ShuttersXu. Pt has no future appts and was last seen January 2016. Per Dr. Roda ShuttersXu patient was suppose to schedule appt with his PCP about his anxiety and the zolof prescription. Rn stated if the PCP cant fill now,per Dr. Roda ShuttersXu he can only do 30 days. Rn explain there will be no more refills after 06/30/.2018. Pt needs to schedule an appt with his PCP to get further refills. Joselyn Glassmanyler will tell patient and his wife that.

## 2015-09-02 NOTE — Telephone Encounter (Signed)
Patient sent a letter from Dr. Roda ShuttersXu for him to seek Zolof refills from his PCP for anxiety.

## 2015-10-05 DIAGNOSIS — M159 Polyosteoarthritis, unspecified: Secondary | ICD-10-CM | POA: Insufficient documentation

## 2015-10-05 DIAGNOSIS — M2041 Other hammer toe(s) (acquired), right foot: Secondary | ICD-10-CM | POA: Insufficient documentation

## 2015-11-19 DIAGNOSIS — Z0000 Encounter for general adult medical examination without abnormal findings: Secondary | ICD-10-CM | POA: Insufficient documentation

## 2016-04-07 ENCOUNTER — Encounter: Payer: Self-pay | Admitting: Family

## 2016-04-12 ENCOUNTER — Ambulatory Visit (HOSPITAL_COMMUNITY)
Admission: RE | Admit: 2016-04-12 | Discharge: 2016-04-12 | Disposition: A | Discharge status: Home / Self Care | Payer: Medicare Other | Source: Ambulatory Visit | Attending: Family | Admitting: Family

## 2016-04-12 ENCOUNTER — Ambulatory Visit (INDEPENDENT_AMBULATORY_CARE_PROVIDER_SITE_OTHER): Payer: Medicare Other | Admitting: Family

## 2016-04-12 ENCOUNTER — Encounter: Payer: Self-pay | Admitting: Family

## 2016-04-12 VITALS — BP 126/78 | HR 56 | Temp 98.3°F | Resp 20 | Ht 69.0 in | Wt 196.5 lb

## 2016-04-12 DIAGNOSIS — Z9889 Other specified postprocedural states: Secondary | ICD-10-CM

## 2016-04-12 DIAGNOSIS — Z48812 Encounter for surgical aftercare following surgery on the circulatory system: Secondary | ICD-10-CM | POA: Insufficient documentation

## 2016-04-12 DIAGNOSIS — I6522 Occlusion and stenosis of left carotid artery: Secondary | ICD-10-CM

## 2016-04-12 DIAGNOSIS — I6523 Occlusion and stenosis of bilateral carotid arteries: Secondary | ICD-10-CM

## 2016-04-12 DIAGNOSIS — I6521 Occlusion and stenosis of right carotid artery: Secondary | ICD-10-CM | POA: Insufficient documentation

## 2016-04-12 NOTE — Patient Instructions (Signed)
Stroke Prevention Some medical conditions and behaviors are associated with an increased chance of having a stroke. You may prevent a stroke by making healthy choices and managing medical conditions. How can I reduce my risk of having a stroke?  Stay physically active. Get at least 30 minutes of activity on most or all days.  Do not smoke. It may also be helpful to avoid exposure to secondhand smoke.  Limit alcohol use. Moderate alcohol use is considered to be:  No more than 2 drinks per day for men.  No more than 1 drink per day for nonpregnant women.  Eat healthy foods. This involves:  Eating 5 or more servings of fruits and vegetables a day.  Making dietary changes that address high blood pressure (hypertension), high cholesterol, diabetes, or obesity.  Manage your cholesterol levels.  Making food choices that are high in fiber and low in saturated fat, trans fat, and cholesterol may control cholesterol levels.  Take any prescribed medicines to control cholesterol as directed by your health care provider.  Manage your diabetes.  Controlling your carbohydrate and sugar intake is recommended to manage diabetes.  Take any prescribed medicines to control diabetes as directed by your health care provider.  Control your hypertension.  Making food choices that are low in salt (sodium), saturated fat, trans fat, and cholesterol is recommended to manage hypertension.  Ask your health care provider if you need treatment to lower your blood pressure. Take any prescribed medicines to control hypertension as directed by your health care provider.  If you are 18-39 years of age, have your blood pressure checked every 3-5 years. If you are 40 years of age or older, have your blood pressure checked every year.  Maintain a healthy weight.  Reducing calorie intake and making food choices that are low in sodium, saturated fat, trans fat, and cholesterol are recommended to manage  weight.  Stop drug abuse.  Avoid taking birth control pills.  Talk to your health care provider about the risks of taking birth control pills if you are over 35 years old, smoke, get migraines, or have ever had a blood clot.  Get evaluated for sleep disorders (sleep apnea).  Talk to your health care provider about getting a sleep evaluation if you snore a lot or have excessive sleepiness.  Take medicines only as directed by your health care provider.  For some people, aspirin or blood thinners (anticoagulants) are helpful in reducing the risk of forming abnormal blood clots that can lead to stroke. If you have the irregular heart rhythm of atrial fibrillation, you should be on a blood thinner unless there is a good reason you cannot take them.  Understand all your medicine instructions.  Make sure that other conditions (such as anemia or atherosclerosis) are addressed. Get help right away if:  You have sudden weakness or numbness of the face, arm, or leg, especially on one side of the body.  Your face or eyelid droops to one side.  You have sudden confusion.  You have trouble speaking (aphasia) or understanding.  You have sudden trouble seeing in one or both eyes.  You have sudden trouble walking.  You have dizziness.  You have a loss of balance or coordination.  You have a sudden, severe headache with no known cause.  You have new chest pain or an irregular heartbeat. Any of these symptoms may represent a serious problem that is an emergency. Do not wait to see if the symptoms will go away.   Get medical help at once. Call your local emergency services (911 in [place] Do not drive yourself to the hospital. This information is not intended to replace advice given to you by your health care provider. Make sure you discuss any questions you have with your health care provider. Document Released: 04/21/2004 Document Revised: 08/20/2015 Document Reviewed: 09/14/2012 Elsevier  Interactive Patient Education  2017 Elsevier Inc.  

## 2016-04-12 NOTE — Progress Notes (Signed)
Chief Complaint: Follow up Extracranial Carotid Artery Stenosis   History of Present Illness  Brandon Mendoza is a 76 y.o. male patient of Dr. Edilia Bo who is status post left carotid endarterectomy 12/09/11 for symptomatic carotid disease. 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   The patient denies a history of amaurosis fugax or monocular blindness, unilateral facial drooping or hemiparesis.   He admits to drinking 3 martinis daily. He plays an hour of racquetball daily. He denies claudication symptoms with walking, denies non-healing wounds.  Patient denies New Medical or Surgical History.  Pt Diabetic: possible prediabetes as he states his sugar was 201 Pt smoker: former smoker, quit in 2006  Pt meds include: Statin : Yes ASA: No, advised to stop by his neurologist since he takes Plavix and he was bruising easily. Other anticoagulants/antiplatelets: Plavix    Past Medical History:  Diagnosis Date  . Anxiety   . Carotid artery occlusion   . COPD (chronic obstructive pulmonary disease) (HCC)   . Depression   . Hx of colonic polyps   . Hyperlipidemia   . Hypertension   . Nocturia   . Peripheral vascular disease (HCC)   . Sleep apnea    on 6 cm, nasal pillow  . Stroke [facility]) 05/01/2011   ischemic    Social History Social History  Substance Use Topics  . Smoking status: Former Smoker    Packs/day: 1.00    Years: 50.00    Types: Cigarettes    Quit date: 08/26/2004  . Smokeless tobacco: Never Used     Comment: quit smoking 08/2004  . Alcohol use 8.4 oz/week    14 Shots of liquor per week     Comment: 2-3 daily cocktails    Family History Family History  Problem Relation Age of Onset  . Alzheimer's disease Father   . Diabetes      Surgical History Past Surgical History:  Procedure Laterality Date  . CAROTID ENDARTERECTOMY   12/09/11   Left cea  . ENDARTERECTOMY  12/09/2011   Procedure: ENDARTERECTOMY CAROTID;  Surgeon: Larina Earthly, MD;  Location: [facility] OR;  Service: Vascular;  Laterality: Left;  . EYE SURGERY     rt retina detachment  . HAMMER TOE SURGERY  2004  . HERNIA REPAIR  2006  . SHOULDER ARTHROSCOPY W/ ROTATOR CUFF REPAIR  2010  . TONSILLECTOMY      No Known Allergies  Current Outpatient Prescriptions  Medication Sig Dispense Refill  . ALPRAZolam (XANAX) 0.5 MG tablet Take 0.5 mg by mouth at bedtime as needed. For anxiety    . Ascorbic Acid (VITAMIN C PO) Take 2 tablets by mouth daily.    . clopidogrel (PLAVIX) 75 MG tablet Take 75 mg by mouth daily with breakfast.    . lisinopril (PRINIVIL,ZESTRIL) 10 MG tablet Take 10 mg by mouth.    . Misc Natural Products (GLUCOSAMINE CHONDROITIN ADV PO) Take 1 tablet by mouth daily.    . Multiple Vitamins-Minerals (MULTIVITAMINS THER. W/MINERALS) TABS Take 1 tablet by mouth daily.    . Omega-3 Fatty Acids (FISH OIL PO) Take 1 capsule by mouth daily.    . sertraline (ZOLOFT) 50 MG tablet TAKE ONE TABLET BY MOUTH ONCE DAILY 30 tablet 0  . simvastatin (ZOCOR) 40 MG tablet Take 20 mg by mouth every evening. 1/2 40 mg     No current  facility-administered medications for this visit.     Review of Systems : See HPI for pertinent positives and negatives.  Physical Examination  Vitals:   04/12/16 1505 04/12/16 1507  BP: (!) 148/79 126/78  Pulse: (!) 56   Resp: 20   Temp: 98.3 F (36.8 C)   TempSrc: Oral   SpO2: 95%   Weight: 196 lb 8 oz (89.1 kg)   Height: 5\' 9"  (1.753 m)    Body mass index is 29.02 kg/m.  General: WDWN male in NAD GAIT: normal Eyes: PERRLA Pulmonary: Respirations are non labored, CTAB, no rales, rhonchi, or wheezing.  Cardiac: regular rhythm, no murmur appreciated.   VASCULAR EXAM Carotid Bruits Left Right   Negative Negative   Aorta is not palpable. Radial pulses are 2+ palpable and  equal.      LE Pulses LEFT RIGHT   POPLITEAL not palpable  not palpable   POSTERIOR TIBIAL  palpable   palpable    DORSALIS PEDIS  ANTERIOR TIBIAL palpable  palpable     Gastrointestinal: soft, nontender, BS WNL, no r/g,no palpable masses.  Musculoskeletal: No muscle atrophy/wasting. M/S 5/5 throughout, Extremities without ischemic changes.  Neurologic: A&O X 3; Appropriate Affect,  Speech is normal, CN 2-12 intact, Pain and light touch intact in extremities, Motor exam as listed above.     Assessment: Brandon Mendoza is a 76 y.o. male who is status post left carotid endarterectomy 12/09/11 for symptomatic carotid disease. 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    DATA Today's carotid duplex suggests 1-39% right ICA stenosis and no restenosis of left CEA site. Bilateral vertebral artery flow is antegrade.  Bilateral subclavian artery waveforms are normal.  No significant change compared to the last exam on 04-07-15.  Plan:  Follow-up in 18 months with Carotid Duplex scan  I discussed in depth with the patient the nature of atherosclerosis, and emphasized the importance of maximal medical management including strict control of blood pressure, blood glucose, and lipid levels, obtaining regular exercise, and continued cessation of smoking.  The patient is aware that without maximal medical management the underlying atherosclerotic disease process will progress, limiting the benefit of any interventions. The patient was given information about stroke prevention and what symptoms should prompt the patient to seek immediate medical care. Thank you for allowing us to participate in  this patient's care.  Charisse MarchSuzanne Maisha Bogen, RN, MSN, FNP-C [facility] [place] Office: [phone]  Clinic Physician: Early  04/12/16 3:22 PM

## 2016-04-14 NOTE — Addendum Note (Signed)
Addended by: Burton ApleyPETTY, Gery Sabedra A on: 04/14/2016 03:37 PM   Modules accepted: Orders

## 2017-03-08 ENCOUNTER — Telehealth: Payer: Self-pay | Admitting: Neurology

## 2017-03-08 NOTE — Telephone Encounter (Signed)
Called the patient back to explain our process. Pt didn't remember who he had seen for his sleep. I proceeded to explain to the patient that I had no records on file from Dr Dohmeier in regards to him which means that it has been roughly 4-5 years. I informed him that we would not be able to just do an apt. I instructed the patient that he would need to contact his PCP and have a referral sent to us to get him in as a patient with Dr Vickey Hugerohmeier. The patient didn't like this answer and was very rude and ugly about why this needed to happen.  I informed him that its important that he is seen on a yearly basis because Dr Vickey Hugerohmeier needs to look at his download on his machine to make sure the patient is tolerating his current settings correctly. The patient angrily ask how do we get a download on his machine if his machine doesn't have a card. I informed him that we have the capability of placing a card to read his machine and download his data. The patient then asked if we do this can we bypass the referral. I stated that we would not be able to see him without a referral back into our practice. I again went over the instructions to the pt to help him with getting this taken care of. The patient then accused me of not sending a response to [facility]. [facility] over 2 weeks. I informed him I [facility] in [facility] and that my response was that the patient had not been seen in over 3 years and therefore we could not sign his supplies for him until he was seen. [facility]. The patient then proceeding to hang up.

## 2017-03-08 NOTE — Telephone Encounter (Signed)
[facility] called [facility] and has not rec'd a response. Pt was advised he needs to be seen on a yearly bases. Pt said he does not know who Porfirio MylarCarmen Dohmeier is and all he needs is his supplies. Pt was very frustrated and continued to ask how he could get in touch with Porfirio Mylararmen Dohmeier to get his supplies. I advised the pt RN would call him back to go over the requirements since he seemed to not understand what I was telling him. Pt was agreeable

## 2017-03-10 NOTE — Telephone Encounter (Signed)
Pt called back in asking for the voicemail for Dr Dohmeier, I reminded pt that it was just explained to him in great detail by RN Baird Lyonsasey on the 12th of December.  Pt asked why he could not just schedule an appointment.  Pt was reminded of the policy re: a referral being needed, he then ended the call.  No call back requested

## 2017-03-13 NOTE — Telephone Encounter (Signed)
This  patient has not been seen in the sleep clinic in over 3 years, but at [facility]  -he  would not need a new referral.  He was seen in 1/ 2016 by Dr Roda ShuttersXu and Heide GuileLynn Lam, NP and  has been seen for [facility] at [facility] before. We need to make an appointment. If CPAP does not have a card reader it must be 77 years old or older and compliance data cannot be obtained at [facility].

## 2017-03-15 NOTE — Telephone Encounter (Signed)
Called the patient and discussed with him that since he saw a MD in 2016 here that even tho it was a different doctor after speaking with Dr Academic librarianDohmeier and manager they have stated that we can schedule the patient in as a office visit. I have scheduled the patient on 12/26 at 8 am.

## 2017-03-21 ENCOUNTER — Encounter: Payer: Self-pay | Admitting: Neurology

## 2017-03-22 ENCOUNTER — Ambulatory Visit (INDEPENDENT_AMBULATORY_CARE_PROVIDER_SITE_OTHER): Payer: Medicare Other | Admitting: Neurology

## 2017-03-22 ENCOUNTER — Encounter: Payer: Self-pay | Admitting: Neurology

## 2017-03-22 VITALS — BP 128/71 | HR 60 | Ht 69.0 in | Wt 197.0 lb

## 2017-03-22 DIAGNOSIS — I6522 Occlusion and stenosis of left carotid artery: Secondary | ICD-10-CM

## 2017-03-22 DIAGNOSIS — I63131 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: Secondary | ICD-10-CM

## 2017-03-22 DIAGNOSIS — I6523 Occlusion and stenosis of bilateral carotid arteries: Secondary | ICD-10-CM | POA: Diagnosis not present

## 2017-03-22 DIAGNOSIS — G4733 Obstructive sleep apnea (adult) (pediatric): Secondary | ICD-10-CM

## 2017-03-22 DIAGNOSIS — Z9989 Dependence on other enabling machines and devices: Secondary | ICD-10-CM

## 2017-03-22 NOTE — Progress Notes (Addendum)
Quick Note:  AHI of 4.3, 100% compliance at 6 cm water pressure CPAP with 2 cm EPR.   6 hours and 21 minutes nightly user time .   No changes necessary . Brandon Kaman, MD  - 2014   SLEEP MEDICINE CLINIC   Provider:  Melvyn Mendoza  Brandon Mendoza, M D  Primary Care Physician:  Brandon Mendoza, Nelson, MD   Referring Provider: Albertina Mendoza, Nelson, MD   Chief Complaint  Patient presents with  . Follow-up    patient alone, rm 10. pt states CPAP working well. he is here for CPAP supplies.     HPI:  Brandon Mendoza is a 76 y.o. male , seen here as in a referral  from Brandon Mendoza. He is an establsished stroke patient , who had undergone a sleep study in 2013 and has been using his CPAP - Compliantly since. He had no follow up in the sleep clinic in the last 3 years and has recollection of seeing me before. I have a note here from my last visit with Brandon Mendoza from 14 May 2012 the patient was at the time followed by Dr. 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  He also had a symptomatic left ICA stenosis in September 2013 with symptoms [facility].  Carotid Doppler showed a left ICA stenosis 60-80%, he was briefly hospitalized with very high systolic blood pressures but did well afterwards he was tolerating aspirin and Plavix.  I was able to follow up with a sleep study on 08 March 2012 and the patient was tested positive for sleep apnea, his apnea was strictly obstructive in origin but to a high degree his AHI was 59.1, REM AHI was under 2.1, he slept all night supine.  Nadir of oxygen saturation was at 86% with 19 minutes of desaturation time totally.  No PLMs.  He was titrated to CPAP at a very low pressure of only 6 cmH2O which alleviated his apnea completely.  He has continued to use his CPAP compliantly ever since and recently tried to get new supplies but he was told that he has to follow up with me first.  Due to the new  Medicare requirements he will now be seen once a year.  I was able to get a compliance report on his machine which is Wi-Fi compatible.  He is 100% compliant for the last 30 days with 83 compliance 4 hours.  His average use of time is 5 hours and 1 minute at night. He feels that the CPAP helps him sleep, he still has 2 times nocturia each night and often does not put the mask back on after his bathroom break therefore the reduced number of hours.  He sleeps at least 7 hours nightly.  His residual AHI is 2.6 on a setting of 6 cm water pressure with 2 cm EPR and the residual apneas are obstructive in nature.  I would not see any reason to change him from his current machine or setting he has very few air leaks.  He endorsed a low degree of fatigue at 9 point and the Epworth sleepiness score at only 5 points, the geriatric depression score was endorsed at 2 out of 15 points.  Basically I will have to certify today that the patient is compliant and that he can get new supplies.  The patient is insured through [employer].  Sleep habits are as follows:  Patient sleeps 7 hours  on average  at night, 2 nocturias.  He sleeps in a differenet bedroom form his wife, watches TV in Bed, but wears his CPAP after 10 PM, and drifts to sleep when the TV is still on.  He rises at 5.30 AM and goes to gym 6 mornings a week, and on other days goes hunting.   Sleep medical history and family sleep history: daughter has OSA on CPAP, lives in [place]. Father had OSA , untreated.   Social history: married, adult children.   Review of Systems: Out of a complete 14 system review, the patient complains of only the following symptoms, and all other reviewed systems are negative. Rhinitis, dry mouth.   Epworth score  5, Fatigue severity score 9  , depression score 2/1 5    Social History   Socioeconomic History  . Marital status: Married    Spouse name: Brandon Mendoza  . Number of children: 4  . Years of education: College  .  Highest education level: Not on file  Social Needs  . Financial resource strain: Not on file  . Food insecurity - worry: Not on file  . Food insecurity - inability: Not on file  . Transportation needs - medical: Not on file  . Transportation needs - non-medical: Not on file  Occupational History  . Occupation: Retired  Tobacco Use  . Smoking status: Former Smoker    Packs/day: 1.00    Years: 50.00    Pack years: 50.00    Types: Cigarettes    Last attempt to quit: 08/26/2004    Years since quitting: 12.5  . Smokeless tobacco: Never Used  . Tobacco comment: quit smoking 08/2004  Substance and Sexual Activity  . Alcohol use: Yes    Alcohol/week: 8.4 oz    Types: 14 Shots of liquor per week    Comment: 2-3 daily cocktails  . Drug use: No  . Sexual activity: Not on file  Other Topics Concern  . Not on file  Social History Narrative   Patient is married with 4 children.   Patient is right handed.   Patient has college education.   Patient drinks 3 cups daily.    Family History  Problem Relation Age of Onset  . Alzheimer's disease Father   . Diabetes Unknown     Past Medical History:  Diagnosis Date  . Anxiety   . Carotid artery occlusion   . COPD (chronic obstructive pulmonary disease) (HCC)   . Depression   . Hx of colonic polyps   . Hyperlipidemia   . Hypertension   . Nocturia   . Peripheral vascular disease (HCC)   . Sleep apnea    on 6 cm, nasal pillow  . 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 CAROTID ENDARTERECTOMY  12/09/11   Left cea  . ENDARTERECTOMY  12/09/2011   Procedure: ENDARTERECTOMY CAROTID;  Surgeon: Larina Earthlyodd F Early, MD;  [facility];  Service: Vascular;  Laterality: Left;  . EYE SURGERY     rt retina detachment  . HAMMER TOE SURGERY  2004  . HERNIA REPAIR  2006  . SHOULDER ARTHROSCOPY W/ ROTATOR CUFF REPAIR  2010  . TONSILLECTOMY      Current Outpatient Medications  Medication Sig  Dispense Refill  . ALPRAZolam (XANAX) 0.5 MG tablet Take 0.5 mg by mouth at bedtime as needed. For anxiety    . Ascorbic Acid (VITAMIN C PO) Take 2 tablets by mouth  daily.    . clopidogrel (PLAVIX) 75 MG tablet Take 75 mg by mouth daily with breakfast.    . lisinopril (PRINIVIL,ZESTRIL) 10 MG tablet Take 10 mg by mouth.    . Misc Natural Products (GLUCOSAMINE CHONDROITIN ADV PO) Take 1 tablet by mouth daily.    . Multiple Vitamins-Minerals (MULTIVITAMINS THER. W/MINERALS) TABS Take 1 tablet by mouth daily.    . Omega-3 Fatty Acids (FISH OIL PO) Take 1 capsule by mouth daily.    . sertraline (ZOLOFT) 50 MG tablet TAKE ONE TABLET BY MOUTH ONCE DAILY 30 tablet 0  . simvastatin (ZOCOR) 20 MG tablet Take 20 mg by mouth every evening. 1/2 40 mg     No current facility-administered medications for this visit.     Allergies as of 03/22/2017  . (No Known Allergies)    Vitals: BP 128/71   Pulse 60   Ht 5\' 9"  (1.753 m)   Wt 197 lb (89.4 kg)   BMI 29.09 kg/m  Last Weight:  Wt Readings from Last 1 Encounters:  03/22/17 197 lb (89.4 kg)   ZOX:WRUE mass index is 29.09 kg/m.     Last Height:   Ht Readings from Last 1 Encounters:  03/22/17 5\' 9"  (1.753 m)    Physical exam:  General: The patient is awake, alert and appears not in acute distress. The patient is well groomed. Head: Normocephalic, atraumatic. Neck is supple. Mallampati 3  neck circumference:17.5 . Nasal airflow congested , Retrognathia is mild.  Cardiovascular:  Regular rate and rhythm without  murmurs or carotid bruit, and without distended neck veins. Respiratory: Lungs are clear to auscultation. Skin:  Without evidence of edema, or rash Trunk: BMI is elevated  The patient's posture is erect  Neurologic exam : The patient is awake and alert, oriented to place and time.    Attention span & concentration ability appears normal. HE REPORTS SHORT TERM MEMORY LOSS.  DELAY. MCI testing in [facility].   Speech is fluent,   without dysarthria, dysphonia or aphasia.  Mood and affect are appropriate.  Cranial nerves: Pupils are equal and briskly reactive to light. Funduscopic exam without evidence of pallor or edema. Status post cataract surgery.  Extraocular movements  in vertical and horizontal planes intact and without nystagmus. Visual fields by finger perimetry are intact. Hearing to finger rub intact.  Facial sensation intact to fine touch. Facial motor strength is symmetric and tongue and uvula move midline. Shoulder shrug was symmetrical.   Motor exam:  Normal tone, muscle bulk and symmetric strength in all extremities. Sensory:  Fine touch, pinprick and vibration were tested in all extremities. Proprioception tested in the upper extremities was normal. Coordination: Rapid alternating movements in the fingers/hands was normal. Finger-to-nose maneuver  normal without evidence of ataxia, dysmetria or tremor.  Gait and station: Patient walks without assistive device.Tandem gait is unfragmented. Turns with 3-Steps, WELL BALANCED . Romberg testing is negative.  Deep tendon reflexes: in the  upper and lower extremities are symmetric and intact.    Assessment:  After physical and neurologic examination, review of laboratory studies,  Personal review of imaging studies, reports of other /same  Imaging studies, results of polysomnography and / or neurophysiology testing and pre-existing records as far as provided in visit., my assessment is   1) OSA on CPAP, well controlled - 6 cm water setting, 2 cm EPR.   2) Reports easily being distracted. MCI diagnosis from [facility],   3) Patient will continue CPAP, has kept  machine and mask in excellent condition. He could work on sleep hygiene, especialy TV in bed !   The patient was advised of the nature of the diagnosed disorder , the treatment options and the  risks for general health and wellness arising from not treating the condition.   I spent more than 40 minutes  of face to face time with the patient.  Greater than 50% of time was spent in counseling and coordination of care. We have discussed the diagnosis and differential and I answered the patient's questions.    Plan:  Treatment plan and additional workup :   Refill supplies- DME , is [facility] in [place]-  Order out to advance to order CPAP supplies, mask  Medium nasal pillow, P10    Brandon Novas, MD 03/22/2017, 7:54 AM  Certified in Neurology by [employer] Certified in Sleep Medicine by [facility] [address] [place], [place] [address]           ______

## 2017-06-16 DIAGNOSIS — M5136 Other intervertebral disc degeneration, lumbar region: Secondary | ICD-10-CM | POA: Insufficient documentation

## 2017-10-11 ENCOUNTER — Ambulatory Visit: Payer: Medicare Other | Admitting: Family

## 2017-10-11 ENCOUNTER — Encounter (HOSPITAL_COMMUNITY): Payer: Medicare Other

## 2017-10-16 ENCOUNTER — Ambulatory Visit (HOSPITAL_COMMUNITY)
Admission: RE | Admit: 2017-10-16 | Discharge: 2017-10-16 | Disposition: A | Discharge status: Home / Self Care | Payer: Medicare Other | Source: Ambulatory Visit | Attending: Family | Admitting: Family

## 2017-10-16 ENCOUNTER — Encounter: Payer: Self-pay | Admitting: Family

## 2017-10-16 ENCOUNTER — Ambulatory Visit (INDEPENDENT_AMBULATORY_CARE_PROVIDER_SITE_OTHER): Payer: Medicare Other | Admitting: Family

## 2017-10-16 ENCOUNTER — Other Ambulatory Visit: Payer: Self-pay

## 2017-10-16 VITALS — BP 161/92 | HR 58 | Temp 97.7°F | Resp 16 | Ht 69.0 in | Wt 194.9 lb

## 2017-10-16 DIAGNOSIS — I6521 Occlusion and stenosis of right carotid artery: Secondary | ICD-10-CM | POA: Diagnosis not present

## 2017-10-16 DIAGNOSIS — Z9889 Other specified postprocedural states: Secondary | ICD-10-CM

## 2017-10-16 DIAGNOSIS — I6522 Occlusion and stenosis of left carotid artery: Secondary | ICD-10-CM

## 2017-10-16 DIAGNOSIS — E785 Hyperlipidemia, unspecified: Secondary | ICD-10-CM | POA: Insufficient documentation

## 2017-10-16 DIAGNOSIS — Z87891 Personal history of nicotine dependence: Secondary | ICD-10-CM

## 2017-10-16 NOTE — Patient Instructions (Signed)

## 2017-10-16 NOTE — Progress Notes (Signed)
Chief Complaint: Follow up Extracranial Carotid Artery Stenosis   History of Present Illness  Brandon Mendoza is a 77 y.o. male who is status post left carotid endarterectomy 12/09/11 by Dr. Edilia Boickson for symptomatic carotid disease. 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   The patient denies a history of amaurosis fugax or monocular blindness, unilateral facial drooping or hemiparesis.   He admits to drinking 3 martinis daily. He plays an hour of racquetball daily and exercises daily in addition to this. He denies claudication symptoms with walking, denies non-healing wounds.  He states his blood pressure at home is in the 120's systolic.  He states he has lots of anxiety about his son's situation.    Diabetic: likely diabetes as he states his fasting sugar remains in the 200's, last A1C result on file was 5.7 in 2013 Tobacco use: former smoker, quit in 2006, smoked x 50 years  Pt meds include: Statin : Yes ASA: No, advised to stop by his neurologist since he takes Plavix and he was bruising easily. Other anticoagulants/antiplatelets: Plavix    Past Medical History:  Diagnosis Date  . Anxiety   . Carotid artery occlusion   . COPD (chronic obstructive pulmonary disease) (HCC)   . Depression   . Hx of colonic polyps   . Hyperlipidemia   . Hypertension   . Nocturia   . Peripheral vascular disease (HCC)   . Sleep apnea    on 6 cm, nasal pillow  . Stroke [facility](HCC) 05/01/2011   ischemic    Social History Social History   Tobacco Use  . Smoking status: Former Smoker    Packs/day: 1.00    Years: 50.00    Pack years: 50.00    Types: Cigarettes    Last attempt to quit: 08/26/2004    Years since quitting: 13.1  . Smokeless tobacco: Never Used  . Tobacco comment: quit smoking 08/2004  Substance Use Topics  . Alcohol use: Yes    Alcohol/week: 8.4 oz     Types: 14 Shots of liquor per week    Comment: 2-3 daily cocktails  . Drug use: No    Family History Family History  Problem Relation Age of Onset  . Alzheimer's disease Father   . Diabetes Unknown     Surgical History Past Surgical History:  Procedure Laterality Date  . CAROTID ENDARTERECTOMY  12/09/11   Left cea  . ENDARTERECTOMY  12/09/2011   Procedure: ENDARTERECTOMY CAROTID;  Surgeon: Larina Earthlyodd F Early, MD;  [facility];  Service: Vascular;  Laterality: Left;  . EYE SURGERY     rt retina detachment  . HAMMER TOE SURGERY  2004  . HERNIA REPAIR  2006  . SHOULDER ARTHROSCOPY W/ ROTATOR CUFF REPAIR  2010  . TONSILLECTOMY      No Known Allergies  Current Outpatient Medications  Medication Sig Dispense Refill  . ALPRAZolam (XANAX) 0.5 MG tablet Take 0.5 mg by mouth at bedtime as needed. For anxiety    . Ascorbic Acid (VITAMIN C PO) Take 2 tablets by mouth daily.    . clopidogrel (PLAVIX) 75 MG tablet Take 75 mg by mouth daily with breakfast.    . lisinopril (PRINIVIL,ZESTRIL) 10 MG tablet Take 10 mg by mouth.    . Misc Natural Products (GLUCOSAMINE CHONDROITIN ADV PO) Take 1 tablet by mouth daily.    . Multiple Vitamins-Minerals (  MULTIVITAMINS THER. W/MINERALS) TABS Take 1 tablet by mouth daily.    . Omega-3 Fatty Acids (FISH OIL PO) Take 1 capsule by mouth daily.    . sertraline (ZOLOFT) 50 MG tablet TAKE ONE TABLET BY MOUTH ONCE DAILY 30 tablet 0  . simvastatin (ZOCOR) 20 MG tablet Take 20 mg by mouth every evening.      No current facility-administered medications for this visit.     Review of Systems : See HPI for pertinent positives and negatives.  Physical Examination  Vitals:   10/16/17 0935 10/16/17 0936  BP: (!) 166/84 (!) 161/92  Pulse: (!) 58   Resp: 16   Temp: 97.7 F (36.5 C)   TempSrc: Oral   SpO2: 96%   Weight: 194 lb 14.4 oz (88.4 kg)   Height: 5\' 9"  (1.753 m)    Body mass index is 28.78 kg/m.  General: WDWN male in NAD GAIT:  normal Eyes: PERRLA HENT: No gross abnormalities.  Pulmonary:  Respirations are non-labored, good air movement in all fields, CTAB, no rales, rhonchi, or wheezing. Cardiac: regular rhythm, no detected murmur.  VASCULAR EXAM Carotid Bruits Right Left   Negative Negative     Abdominal aortic pulse is not palpable. Radial pulses are 2+ palpable and equal.                                                                                                                            LE Pulses Right Left       POPLITEAL  not palpable   not palpable       POSTERIOR TIBIAL  1+ palpable   2+ palpable        DORSALIS PEDIS      ANTERIOR TIBIAL 1+ palpable  1+ palpable     Gastrointestinal: soft, nontender, BS WNL, no r/g, no palpable masses. Musculoskeletal: no muscle atrophy/wasting. M/S 5/5 throughout, extremities without ischemic changes. Skin: No rashes, no ulcers, no cellulitis.   Neurologic:  A&O X 3; appropriate affect, sensation is normal; speech is normal, CN 2-12 intact, pain and light touch intact in extremities, motor exam as listed above. Psychiatric: Normal thought content, mood appropriate to clinical situation.     Assessment: Brandon Mendoza is a 77 y.o. male who is status post left carotid endarterectomy 12/09/11 for symptomatic carotid disease. 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   DATA Carotid Duplex (10-16-17): Right ICA: 1-39% stenosis Left ICA: CEA site with no stenosis Bilateral vertebral artery flow is antegrade.  Bilateral subclavian artery waveforms are normal.  No change compared to the exams on 04-07-15 and 04-12-16.   Plan: Follow-up in 18 months with Carotid Duplex scan.  I discussed in depth with the patient the nature of atherosclerosis, and emphasized the importance of maximal medical management including strict control of  blood pressure, blood glucose,  and lipid levels, obtaining regular exercise, and continued cessation of smoking.  The patient is aware that without maximal medical management the underlying atherosclerotic disease process will progress, limiting the benefit of any interventions. The patient was given information about stroke prevention and what symptoms should prompt the patient to seek immediate medical care. Thank you for allowing [place] to participate in this patient's care.  Charisse March, RN, MSN, FNP-C [facility] Office: [phone]  Clinic Physician: Myra Gianotti  10/16/17 9:39 AM

## 2018-03-01 DIAGNOSIS — M25562 Pain in left knee: Secondary | ICD-10-CM | POA: Insufficient documentation

## 2018-03-21 ENCOUNTER — Encounter: Payer: Self-pay | Admitting: Neurology

## 2018-03-22 ENCOUNTER — Encounter: Payer: Self-pay | Admitting: Neurology

## 2018-03-22 ENCOUNTER — Ambulatory Visit (INDEPENDENT_AMBULATORY_CARE_PROVIDER_SITE_OTHER): Payer: Medicare Other | Admitting: Neurology

## 2018-03-22 ENCOUNTER — Ambulatory Visit: Payer: Medicare Other | Admitting: Adult Health

## 2018-03-22 VITALS — BP 133/78 | HR 64 | Ht 69.0 in | Wt 196.0 lb

## 2018-03-22 DIAGNOSIS — I63131 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: Secondary | ICD-10-CM | POA: Diagnosis not present

## 2018-03-22 DIAGNOSIS — Z72821 Inadequate sleep hygiene: Secondary | ICD-10-CM | POA: Diagnosis not present

## 2018-03-22 DIAGNOSIS — R413 Other amnesia: Secondary | ICD-10-CM

## 2018-03-22 DIAGNOSIS — G4733 Obstructive sleep apnea (adult) (pediatric): Secondary | ICD-10-CM

## 2018-03-22 DIAGNOSIS — Z9989 Dependence on other enabling machines and devices: Secondary | ICD-10-CM

## 2018-03-22 DIAGNOSIS — I6522 Occlusion and stenosis of left carotid artery: Secondary | ICD-10-CM | POA: Diagnosis not present

## 2018-03-22 NOTE — Progress Notes (Signed)
Quick Note:  AHI of 4.3, 100% compliance at 6 cm water pressure CPAP with 2 cm EPR.   6 hours and 21 minutes nightly user time .   No changes necessary . [name], MD  - 2014   S[facility]EP [facility]INE CLINIC   Provider:  Melvyn Novas, MD   Primary Care Physician:  Albertina Senegal, MD   Referring Provider: Pearlean Brownie, MD   Chief Complaint  Patient presents with  . Follow-up    pt alone, rm 11. pt states uses his machine every night. he forgot to bring the card by. DME [facility]. pt will bring his card by for me to get a download.    HPI:  Brandon Mendoza is a 77 y.o. male , seen on RV on 03-22-2018 for CPAP compliance but did not bring his CPAP nor the memory chip. His machine is now 78 years old, and he is entitled to a new one, but feels his old one is still working well. He has 3 nocturias and still snores, and he wants to watch TV in his bedroom- his wife has moved to another room. He has several Martinis each night and takes Xanax (!).  Brandon Mendoza is a meanwhile 77 year old Caucasian right-handed gentleman with a history of stroke and aftercare through [name] and later through [name].  He has been on his CPAP for about 6 years now.  I do not have the compliance data available but he will bring them by today or tomorrow.  A discussion is if he needs a new machine he is entitled to 1, I would like for him to have the best possible and interrupted apnea care and is my experience that the machines are set with a software that makes him obsolete after 6 to 7 years anyway. He will undergo HST but is not willing to undergo PSG. He understands this will likely lead to a autotitration CPAP, nasal pillows.   He was seen here as in a referral  from [name] in 2017. He is an establsished stroke patient , who had undergone a sleep study in 2013 and has been using his CPAP - Compliantly since. He had no follow up in the sleep clinic in the last 3 years and has no recollection of seeing me before. I  have a note here from my last visit with [name] from 14 May 2012 the patient was at the time followed by 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  Carotid Doppler showed a left ICA stenosis 60-80%, he was briefly hospitalized with very high systolic blood pressures but did well afterwards he was tolerating aspirin and Plavix.  I was able to follow up with a sleep study on 08 March 2012 and the patient was tested positive for sleep apnea, his apnea was strictly obstructive in origin but to a high degree his AHI was 59.1, REM AHI was under 2.1, he slept all night supine.  Nadir of oxygen saturation was at 86% with 19 minutes of desaturation time totally.  No PLMs.  He was titrated to CPAP at a very low pressure of only 6 cmH2O which alleviated his apnea completely.  He has continued to use his CPAP compliantly ever since and recently tried to get new supplies but he was told that he has  to follow up with me first.  Due to the new Medicare requirements he will now be seen once a year.  I was able to get a compliance report on his machine which is Wi-Fi compatible.  He is 100% compliant for the last 30 days with 83 compliance 4 hours.  His average use of time is 5 hours and 1 minute at night. He feels that the CPAP helps him sleep, he still has 2 times nocturia each night and often does not put the mask back on after his bathroom break therefore the reduced number of hours.  He sleeps at least 7 hours nightly.  His residual AHI is 2.6 on a setting of 6 cm water pressure with 2 cm EPR and the residual apneas are obstructive in nature.  I would not see any reason to change him from his current machine or setting he has very few air leaks.  He endorsed a low degree of fatigue at 9 point and the Epworth sleepiness score at only 5  points, the geriatric depression score was endorsed at 2 out of 15 points.  Basically I will have to certify today that the patient is compliant and that he can get new supplies.  The patient is insured through [employer].  Sleep habits are as follows:  Patient sleeps 7 hours on average  at night, 2 nocturias.  He sleeps in a differenet bedroom form his wife, watches TV in Bed, but wears his CPAP after 10 PM, and drifts to sleep when the TV is still on.  He rises at 5.30 AM and goes to gym 6 mornings a week, and on other days goes hunting.   Sleep medical history and family sleep history: daughter has OSA on CPAP, lives in [place]. Father had OSA , untreated.   Social history: married, adult children.   Review of Systems: Out of a complete 14 system review, the patient complains of only the following symptoms, and all other reviewed systems are negative. Rhinitis, dry mouth.   Epworth score  5, Fatigue severity score 9  , depression score 2/1 5    Social History   Socioeconomic History  . Marital status: Married    Spouse name: [name]  . Number of children: 4  . Years of education: College  . Highest education level: Not on file  Occupational History  . Occupation: Retired  Engineer, productionocial Needs  . Financial resource strain: Not on file  . Food insecurity:    Worry: Not on file    Inability: Not on file  . Transportation needs:    Medical: Not on file    Non-medical: Not on file  Tobacco Use  . Smoking status: Former Smoker    Packs/day: 1.00    Years: 50.00    Pack years: 50.00    Types: Cigarettes    Last attempt to quit: 08/26/2004    Years since quitting: 13.5  . Smokeless tobacco: Never Used  . Tobacco comment: quit smoking 08/2004  Substance and Sexual Activity  . Alcohol use: Yes    Alcohol/week: 14.0 standard drinks    Types: 14 Shots of liquor per week    Comment: 2-3 daily cocktails  . Drug use: No  . Sexual activity: Not on file  Lifestyle  . Physical activity:     Days per week: Not on file    Minutes per session: Not on file  . Stress: Not on file  Relationships  . Social connections:  Talks on phone: Not on file    Gets together: Not on file    Attends religious service: Not on file    Active member of club or organization: Not on file    Attends meetings of clubs or organizations: Not on file    Relationship status: Not on file  . Intimate partner violence:    Fear of current or ex partner: Not on file    Emotionally abused: Not on file    Physically abused: Not on file    Forced sexual activity: Not on file  Other Topics Concern  . Not on file  Social History Narrative   Patient is married with 4 children.   Patient is right handed.   Patient has college education.   Patient drinks 3 cups daily.    Family History  Problem Relation Age of Onset  . Alzheimer's disease Father   . Diabetes Unknown     Past Medical History:  Diagnosis Date  . Anxiety   . Carotid artery occlusion   . COPD (chronic obstructive pulmonary disease) (HCC)   . Depression   . Hx of colonic polyps   . Hyperlipidemia   . Hypertension   . Nocturia   . Peripheral vascular disease (HCC)   . Sleep apnea    on 6 cm, nasal pillow  . 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 CAROTID ENDARTERECTOMY  12/09/11   Left cea  . ENDARTERECTOMY  12/09/2011   Procedure: ENDARTERECTOMY CAROTID;  Surgeon: Larina Earthlyodd F Early, MD;  [facility];  Service: Vascular;  Laterality: Left;  . EYE SURGERY     rt retina detachment  . HAMMER TOE SURGERY  2004  . HERNIA REPAIR  2006  . SHOULDER ARTHROSCOPY W/ ROTATOR CUFF REPAIR  2010  . TONSILLECTOMY      Current Outpatient Medications  Medication Sig Dispense Refill  . ALPRAZolam (XANAX) 0.5 MG tablet Take 0.5 mg by mouth at bedtime as needed. For anxiety    . Ascorbic Acid (VITAMIN C PO) Take 2 tablets by mouth daily.    . clopidogrel (PLAVIX) 75 MG tablet Take 75 mg by  mouth daily with breakfast.    . lisinopril (PRINIVIL,ZESTRIL) 10 MG tablet Take 10 mg by mouth.    . Misc Natural Products (GLUCOSAMINE CHONDROITIN ADV PO) Take 1 tablet by mouth daily.    . Multiple Vitamins-Minerals (MULTIVITAMINS THER. W/MINERALS) TABS Take 1 tablet by mouth daily.    . Omega-3 Fatty Acids (FISH OIL PO) Take 1 capsule by mouth daily.    . sertraline (ZOLOFT) 50 MG tablet TAKE ONE TABLET BY MOUTH ONCE DAILY 30 tablet 0  . simvastatin (ZOCOR) 20 MG tablet Take 20 mg by mouth every evening.      No current facility-administered medications for this visit.     Allergies as of 03/22/2018  . (No Known Allergies)    Vitals: BP 133/78   Pulse 64   Ht 5\' 9"  (1.753 m)   Wt 196 lb (88.9 kg)   BMI 28.94 kg/m  Last Weight:  Wt Readings from Last 1 Encounters:  03/22/18 196 lb (88.9 kg)   ZOX:WRUEBMI:Body mass index is 28.94 kg/m.     Last Height:   Ht Readings from Last 1 Encounters:  03/22/18 5\' 9"  (1.753 m)    Physical exam:  General: The patient is awake, alert and appears not in acute distress. The patient is well  groomed. Head: Normocephalic, atraumatic. Neck is supple. Mallampati 3  neck circumference:17.5 . Nasal airflow congested , Retrognathia is mild.  Cardiovascular:  Regular rate and rhythm without  murmurs or carotid bruit, and without distended neck veins. Respiratory: Lungs are clear to auscultation. Skin:  Without evidence of edema, or rash Trunk: BMI is 28. The patient's posture is erect  Neurologic exam : The patient is awake and alert, oriented to place and time.    Attention span & concentration ability appears normal. HE REPORTS SHORT TERM MEMORY LOSS.  DELAY. M[facility] testing in [facility].   Speech is fluent,  without dysarthria, dysphonia or aphasia.  Mood and affect are appropriate.  Cranial nerves: Taste and smell intact. Pupils are equal and briskly reactive to light.  Status post cataract surgery. Extraocular movements  in vertical and horizontal  planes intact and without nystagmus.  Visual fields by finger perimetry are intact. Hearing to finger rub impaired.   Facial motor strength is symmetric and tongue and uvula move midline. Shoulder shrug was symmetrical.    Assessment:  After physical and neurologic examination, review of laboratory studies,  Personal review of imaging studies, reports of other /same  Imaging studies, results of polysomnography and / or neurophysiology testing and pre-existing records as far as provided in visit., my assessment is:  Stroke well recovered.    1) last visit  OSA on CPAP, well controlled - 6 cm water setting, 2 cm EPR. I will need a HST to confirm he still has OSA, and his CPAP download.   2) Reports easily being distracted. MCI diagnosis from [facility], he drinks and has poor sleep hygiene. Patient will continue CPAP, has kept machine and mask in excellent condition. He could work on sleep hygiene, especialy TV in bed !   The patient was advised of the nature of the diagnosed disorder , the treatment options and the  risks for general health and wellness arising from not treating the condition.   I spent more than 20 minutes of face to face time with the patient.  Greater than 50% of time was spent in counseling and coordination of care. We have discussed the diagnosis and differential and I answered the patient's questions.    Plan:  Treatment plan and additional workup :   Refill supplies- DME , is [facility] in [place]-  Order out to advance to order CPAP supplies, mask  Medium nasal pillow, P10    Melvyn Novas, MD 03/22/2018, 10:13 AM  Certified in Neurology by ABPN Certified in Sleep Medicine by [facility] [address], [address] [place], [place] [address]           ______

## 2018-03-26 ENCOUNTER — Telehealth: Payer: Self-pay

## 2018-03-26 ENCOUNTER — Other Ambulatory Visit: Payer: Self-pay | Admitting: Neurology

## 2018-03-26 DIAGNOSIS — G4733 Obstructive sleep apnea (adult) (pediatric): Secondary | ICD-10-CM

## 2018-03-26 DIAGNOSIS — Z9989 Dependence on other enabling machines and devices: Principal | ICD-10-CM

## 2018-03-26 NOTE — Telephone Encounter (Signed)
Placed the in lab split night sleep study order.

## 2018-03-26 NOTE — Telephone Encounter (Signed)
Medicare will not cover HST if new equipment is needed and patient has COPD. Need an in lab sleep study ordered. I spoke with patient and explained medicare policy. He is willing to do an in lab sleep study.

## 2018-04-13 DIAGNOSIS — R3915 Urgency of urination: Secondary | ICD-10-CM | POA: Insufficient documentation

## 2018-04-13 DIAGNOSIS — N3941 Urge incontinence: Secondary | ICD-10-CM | POA: Insufficient documentation

## 2018-04-24 ENCOUNTER — Ambulatory Visit (INDEPENDENT_AMBULATORY_CARE_PROVIDER_SITE_OTHER): Payer: Medicare Other | Admitting: Neurology

## 2018-04-24 DIAGNOSIS — G4733 Obstructive sleep apnea (adult) (pediatric): Secondary | ICD-10-CM | POA: Diagnosis not present

## 2018-04-24 DIAGNOSIS — I63131 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: Secondary | ICD-10-CM

## 2018-04-24 DIAGNOSIS — Z9989 Dependence on other enabling machines and devices: Secondary | ICD-10-CM

## 2018-04-24 DIAGNOSIS — G4731 Primary central sleep apnea: Secondary | ICD-10-CM

## 2018-04-24 DIAGNOSIS — I10 Essential (primary) hypertension: Secondary | ICD-10-CM

## 2018-04-24 DIAGNOSIS — I6522 Occlusion and stenosis of left carotid artery: Secondary | ICD-10-CM

## 2018-05-03 NOTE — Procedures (Signed)
PATIENT'S NAME:  Brandon Mendoza, Brandon H. DOB:      [birth year]      MRN:    [id number]     DATE OF RECORDING: 04/24/2018  [facility] REFERRING M.D.:  Delia HeadyPramod Sethi, MD Study Performed:   Baseline Polysomnogram HISTORY:  Brandon Brandon H Mendoza is seen on 03-22-2018 for CPAP compliance but did not bring his CPAP nor the memory chip. Mr. Brandon Brandon Mendoza is a meanwhile 78 year old Caucasian right-handed gentleman with a history of stroke and aftercare through Dr. Pearlean BrownieSethi and later through Dr. Roda ShuttersXu.  He has been on his CPAP for about 6.5 years now.  I do not have the compliance data available but he will bring them by today or tomorrow. He has nocturia times 3 and still snores, and he wants to watch TV in his bedroom- his wife has moved to another room. He has several Martinis each night and takes Xanax (!).  He was seen upon a referral from Dr. Roda ShuttersXu in 2017. He had undergone a sleep study last in 2013 and has been using his CPAP compliantly since. He had no follow up in the sleep clinic in the last 3 years.  I have a note here from my last visit with [name] from 14 May 2012 the patient was at the time followed by Dr. 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  He also had a symptomatic left ICA stenosis in September 2013 with symptoms presented [facility].  Carotid Doppler showed a left ICA stenosis 60-80%, he was briefly hospitalized with very high systolic blood pressures but did well afterwards he was tolerating aspirin and Plavix.   I was able to follow up with a sleep study on 08 March 2012 and the patient was tested positive for sleep apnea, his apnea was strictly obstructive in origin but to a high degree his AHI was 59.1, REM AHI was under 2.1, he slept all night supine.  Nadir of oxygen saturation was at 86% with 19 minutes of desaturation time totally.  No PLMs. He was titrated to CPAP at a very low pressure of only 6 cmH2O which alleviated his  apnea completely He endorsed a low degree of fatigue at 9 points and the Epworth sleepiness score at 5 points, the geriatric depression score was endorsed at 2 / 15 points.   Basically I will have to certify today that the patient is compliant and that he can get new supplies.  The patient's weight 196 pounds with a height of 69 (inches), resulting in a BMI of 28.6 kg/m2. The patient's neck circumference measured 17.5 inches.  CURRENT MEDICATIONS: Xanax, Vitamin C, Plavix, Prinivil, Multivitamins, Omega-3, Zoloft, Zocor.   PROCEDURE:  This is a multichannel digital polysomnogram utilizing the Somnostar 11.2 system.  Electrodes and sensors were applied and monitored per AASM Specifications.   EEG, EOG, Chin and Limb EMG, were sampled at 200 Hz.  ECG, Snore and Nasal Pressure, Thermal Airflow, Respiratory Effort, CPAP Flow and Pressure, Oximetry was sampled at 50 Hz. Digital video and audio were recorded.      BASELINE STUDY :Lights Out was at 22:28 and Lights On at 05:40.  Total recording time (TRT) was 432.5 minutes, with a total sleep time (TST) of 378 minutes.   The patient's sleep latency was 5.5 minutes.  REM latency was 157 minutes.  The sleep efficiency was 87.4 %.     SLEEP ARCHITECTURE: WASO (Wake after sleep onset)  was 46.5 minutes.  There were 12 minutes in Stage N1, 275 minutes Stage N2, 57.5 minutes Stage N3 and 33.5 minutes in Stage REM.  The percentage of Stage N1 was 3.2%, Stage N2 was 72.8%, Stage N3 was 15.2% and Stage R (REM sleep) was 8.9%. RESPIRATORY ANALYSIS:  There were a total of 110 respiratory events:  8 obstructive apneas, 5 central apneas and 0 mixed apneas with 97 hypopneas with a hypopnea index of 15.4 /hour. The patient also had several respiratory event related arousals (RERAs). The total APNEA/HYPOPNEA INDEX (AHI) was 17.5/hour and the total RESPIRATORY DISTURBANCE INDEX was 20.5 /hour.  All 199 events in NREM. The patient spent 162.5 minutes of total sleep time in the  supine position and 216 minutes in non-supine.. The supine AHI was 38.4 versus a non-supine AHI of 1.7/h.  OXYGEN SATURATION & C02:  The Wake baseline 02 saturation was 90%, with the lowest being 86%. Time spent below 89% saturation equaled 18 minutes. AROUSALS/ PLM:  The arousals were noted as: 66 were spontaneous, 0 were associated with PLMs, and 37 were associated with respiratory events. The patient had a total of 0 Periodic Limb Movements.    Audio and video analysis did not show any abnormal or unusual movements, behaviors, phonations or vocalizations.  The patient took one bathroom break. Moderate Snoring was noted. EKG with variable R to R intervals, some PVC.  Post-study, the patient indicated that sleep was the same as usual.   IMPRESSION:  1. 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  2. Primary Snoring 3. Non-specific abnormal EKG   RECOMMENDATIONS:  1. Advise full-night, attended, CPAP titration study to optimize therapy. The patient qualifies for a new CPAP.  2. Avoiding supine sleep position will help reducing the sleep apnea and snoring.     I certify that I have reviewed the entire raw data recording prior to the issuance of this report in accordance with the Standards of Accreditation of the American Academy of Sleep Medicine (AASM)    Melvyn Novas, MD   05-02-2018 Diplomat, American Board of Psychiatry and Neurology  Diplomat, American Board of Sleep Medicine Medical Director, [place] [facility] [employer]

## 2018-05-03 NOTE — Addendum Note (Signed)
Addended by: Melvyn Novas on: 05/03/2018 08:30 AM   Modules accepted: Orders

## 2018-05-07 ENCOUNTER — Telehealth: Payer: Self-pay | Admitting: Neurology

## 2018-05-07 ENCOUNTER — Other Ambulatory Visit: Payer: Self-pay | Admitting: Neurology

## 2018-05-07 DIAGNOSIS — Z9989 Dependence on other enabling machines and devices: Principal | ICD-10-CM

## 2018-05-07 DIAGNOSIS — G4733 Obstructive sleep apnea (adult) (pediatric): Secondary | ICD-10-CM

## 2018-05-07 NOTE — Telephone Encounter (Signed)
Ok to [facility]. 5-10 cm water , 3 cm EPR, mask of hiss choice.

## 2018-05-07 NOTE — Telephone Encounter (Signed)
Called the patient and advised that Dr Vickey Hugerohmeier found the patient still has apnea. I reviewed the sleep study results in detail. Advised the patient Dr Vickey Hugerohmeier recommends the patient come in for a CPAP titration study. Patient does not want to come in for another study. Patient states that he has been using CPAP for yrs. Advised the patient that I would pull what his current pressure is on his machine and see if Dr Vickey Hugerohmeier would be willing to order the CPAP without the need for titration study. Patient states he would appreciate if he didn't have to come in. Informed him I would let him know what she says.

## 2018-05-07 NOTE — Telephone Encounter (Signed)
-----   Message from Melvyn Novasarmen Dohmeier, MD sent at 05/03/2018  8:30 AM EST ----- Many respiratory arousals , but did not make cut-off for SPLIT .  Will return for CPAP titration, please note positional apnea component and snoring. CD IMPRESSION:  1. Complex, mostly Obstructive Sleep Apnea (OSA), dependent on  supine position.  2. Primary Snoring 3. Non-specific abnormal EKG   RECOMMENDATIONS:  1. Advise full-night, attended, CPAP titration study to optimize  therapy. The patient qualifies for a new CPAP.  2. Avoiding supine sleep position will help reducing the sleep  apnea and snoring.

## 2018-05-07 NOTE — Telephone Encounter (Signed)
Called the patient. No answer. LVM informing the patient that Dr Vickey Hugerohmeier was ok with the patient starting a auto CPAP machine vs coming back for the CPAP titration test. Advised the patient the [facility]. Instructed the patient to call back. When he calls back patient needs to schedule a follow up visit 60-90 days after starting the new machine. Please set the patient up with an apt around 1st of April. Apt can be with Dr Vickey Hugerohmeier, Shanda BumpsJessica NP or Shawnie DapperAmy Lomax NP (in Megan's absence)

## 2018-06-04 ENCOUNTER — Telehealth: Payer: Self-pay | Admitting: Neurology

## 2018-06-04 NOTE — Telephone Encounter (Signed)
Pt is demanding to be seen in march or april 1-10. Pt became irate and used profanity and said that was not acceptable when I offered what the MD and the first NP schedule had to offer and was not able to check another NP schedule before pt stated to have the nurse call him and he proceded to hang up.

## 2018-06-04 NOTE — Telephone Encounter (Signed)
Called the patient and offered him a initial CPAP follow up with Shawnie Dapper, NP for march 26,2020 at 2:00 pm with check in of 1:30 pm. Pt verbalized understanding. Pt had no questions at this time but was encouraged to call back if questions arise.

## 2018-06-20 ENCOUNTER — Telehealth: Payer: Self-pay

## 2018-06-20 DIAGNOSIS — G4739 Other sleep apnea: Secondary | ICD-10-CM | POA: Insufficient documentation

## 2018-06-20 DIAGNOSIS — Z9989 Dependence on other enabling machines and devices: Principal | ICD-10-CM

## 2018-06-20 DIAGNOSIS — G4733 Obstructive sleep apnea (adult) (pediatric): Secondary | ICD-10-CM | POA: Insufficient documentation

## 2018-06-20 NOTE — Progress Notes (Signed)
PATIENT: Brandon Mendoza DOB: 1941/03/05  REASON FOR VISIT: follow up HISTORY FROM: patient  Virtual Visit via Telephone Note  I connected with [name] on 06/21/18 at  2:00 PM EDT by telephone and verified that I am speaking with the correct person using two identifiers.   I discussed the limitations, risks, security and privacy concerns of performing an evaluation and management service by telephone and the availability of in person appointments. I also discussed with the patient that there may be a patient responsible charge related to this service. The patient expressed understanding and agreed to proceed.   History of Present Illness:  06/21/18 Brandon Mendoza is a 78 y.o. male for follow up of OSA on CPAP.  Mr. reports that he is doing well with his new CPAP machine.  He denies any concerns.  He is in need of new supplies.  Compliance report data as follows:  05/22/2018 - 06/20/2018  Compliance Report Usage 05/22/2018 - 06/20/2018 Usage days 30/30 days (100%) >= 4 hours 30 days (100%) < 4 hours 0 days (0%) Usage hours 173 hours 29 minutes Average usage (total days) 5 hours 47 minutes Average usage (days used) 5 hours 47 minutes Median usage (days used) 5 hours 33 minutes Total used hours (value since last reset - 06/20/2018) 177 hours AirSense 10 AutoSet Serial number [id number] Mode AutoSet Min Pressure 5 cmH2O Max Pressure 10 cmH2O EPR Fulltime EPR level 2 Response Standard Therapy Pressure - cmH2O Median: 7.8 95th percentile: 9.7 Maximum: 9.9 Leaks - L/min Median: 1.0 95th percentile: 9.3 Maximum: 16.1 Events per hour AI: 1.4 HI: 0.2 AHI: 1.6 Apnea Index Central: 0.7 Obstructive: 0.6 Unknown: 0.0 RERA Index 0.1 Cheyne-Stokes respiration (average duration per night) 0 minutes (0%)   History (copied from Dr Oliva Bustard note on 03/22/2018)  HPI:  Brandon Mendoza is a 78 y.o. male , seen on RV on 03-22-2018 for CPAP compliance but did not bring his CPAP nor the  memory chip. His machine is now 79 years old, and he is entitled to a new one, but feels his old one is still working well. He has 3 nocturias and still snores, and he wants to watch TV in his bedroom- his wife has moved to another room. He has several Martinis each night and takes Xanax (!).  Mr. NILAY Mendoza is a meanwhile 78 year old Caucasian right-handed gentleman with a history of stroke and aftercare through [name] and later through [name].  He has been on his CPAP for about 6 years now.  I do not have the compliance data available but he will bring them by today or tomorrow.  A discussion is if he needs a new machine he is entitled to 1, I would like for him to have the best possible and interrupted apnea care and is my experience that the machines are set with a software that makes him obsolete after 6 to 7 years anyway. He will undergo HST but is not willing to undergo PSG. He understands this will likely lead to a autotitration CPAP, nasal pillows.   He was seen here as in a referral  from [name] in 2017. He is an establsished stroke patient , who had undergone a sleep study in 2013 and has been using his CPAP - Compliantly since. He had no follow up in the sleep clinic in the last 3 years and has no recollection of seeing me before. I have a note here from my last  visit with Mr. Brandis from 14 May 2012 the patient was at the time followed by 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  He also had a symptomatic left ICA stenosis in September 2013 with symptoms presented first to [facility].  Carotid Doppler showed a left ICA stenosis 60-80%, he was briefly hospitalized with very high systolic blood pressures but did well afterwards he was tolerating aspirin and Plavix.  I was able to follow up with a sleep study on 08 March 2012 and the patient was tested positive for sleep apnea, his apnea was strictly obstructive in  origin but to a high degree his AHI was 59.1, REM AHI was under 2.1, he slept all night supine.  Nadir of oxygen saturation was at 86% with 19 minutes of desaturation time totally.  No PLMs.  He was titrated to CPAP at a very low pressure of only 6 cmH2O which alleviated his apnea completely.  He has continued to use his CPAP compliantly ever since and recently tried to get new supplies but he was told that he has to follow up with me first.  Due to the new Medicare requirements he will now be seen once a year.  I was able to get a compliance report on his machine which is Wi-Fi compatible.  He is 100% compliant for the last 30 days with 83 compliance 4 hours.  His average use of time is 5 hours and 1 minute at night. He feels that the CPAP helps him sleep, he still has 2 times nocturia each night and often does not put the mask back on after his bathroom break therefore the reduced number of hours.  He sleeps at least 7 hours nightly.  His residual AHI is 2.6 on a setting of 6 cm water pressure with 2 cm EPR and the residual apneas are obstructive in nature.  I would not see any reason to change him from his current machine or setting he has very few air leaks.  He endorsed a low degree of fatigue at 9 point and the Epworth sleepiness score at only 5 points, the geriatric depression score was endorsed at 2 out of 15 points.  Basically I will have to certify today that the patient is compliant and that he can get new supplies.  The patient is insured through [employer].  Sleep habits are as follows:  Patient sleeps 7 hours on average  at night, 2 nocturias.  He sleeps in a differenet bedroom form his wife, watches TV in Bed, but wears his CPAP after 10 PM, and drifts to sleep when the TV is still on.  He rises at 5.30 AM and goes to gym 6 mornings a week, and on other days goes hunting.   Sleep medical history and family sleep history: daughter has OSA on CPAP, lives in [place]. Father had OSA ,  untreated.   Social history: married, adult children.    Observations/Objective:  Generalized: Well developed, in no acute distress  Mentation: Alert oriented to time, place, history taking. Follows all commands speech and language fluent   Assessment and Plan:  78 y.o. year old male  has a past medical history of Anxiety, Carotid artery occlusion, COPD (chronic obstructive pulmonary disease) (HCC), Depression, colonic polyps, Hyperlipidemia, Hypertension, Nocturia, Peripheral vascular disease (HCC), Sleep apnea, and Stroke (HCC) (05/01/2011). with    ICD-10-CM   1. OSA on CPAP G47.33 For home use only  DME continuous positive airway pressure (CPAP)   Z99.89    Mr. Sprauer is doing well with new CPAP machine.  He denies any concerns at this time.  Compliance report shows optimal compliance.  I have reeducated him on the need to use CPAP nightly and for at least 4 hours every night, longer if possible.  We will day for any supplies.  He was instructed to call our office with any new concerns or questions.  We will follow-up with him in 1 year.  Orders Placed This Encounter  Procedures  . For home use only DME continuous positive airway pressure (CPAP)    Supplies please    Order Specific Question:   Patient has OSA or probable OSA    Answer:   Yes    Order Specific Question:   Settings    Answer:   Other see comments    Order Specific Question:   CPAP supplies needed    Answer:   Mask, headgear, cushions, filters, heated tubing and water chamber    No orders of the defined types were placed in this encounter.    Follow Up Instructions:  I discussed the assessment and treatment plan with the patient. The patient was provided an opportunity to ask questions and all were answered. The patient agreed with the plan and demonstrated an understanding of the instructions.   The patient was advised to call back or seek an in-person evaluation if the symptoms worsen or if the condition  fails to improve as anticipated.  I provided 25 minutes of non-face-to-face time during this encounter.  During this telephone visit patient is located in his legs resident, provider located at her place of residence.  Rozell Searing, CMA facilitated visit.   Shawnie Dapper, NP

## 2018-06-20 NOTE — Telephone Encounter (Signed)
Patient has given verbal consent to a telephone cal with Shawnie Dapper, NP.   No concerns at this time. No changes to medications, medical or surgical history.

## 2018-06-21 ENCOUNTER — Other Ambulatory Visit: Payer: Self-pay

## 2018-06-21 ENCOUNTER — Ambulatory Visit (INDEPENDENT_AMBULATORY_CARE_PROVIDER_SITE_OTHER): Payer: Medicare Other | Admitting: Family Medicine

## 2018-06-21 ENCOUNTER — Encounter: Payer: Self-pay | Admitting: Family Medicine

## 2018-06-21 DIAGNOSIS — Z9989 Dependence on other enabling machines and devices: Secondary | ICD-10-CM | POA: Diagnosis not present

## 2018-06-21 DIAGNOSIS — G4733 Obstructive sleep apnea (adult) (pediatric): Secondary | ICD-10-CM | POA: Diagnosis not present

## 2018-08-06 ENCOUNTER — Telehealth: Payer: Self-pay | Admitting: Family Medicine

## 2018-08-06 NOTE — Telephone Encounter (Signed)
Left message requesting pt return call to sched 1 yr f/u with Amy

## 2019-04-18 ENCOUNTER — Ambulatory Visit: Payer: Medicare Other | Attending: Internal Medicine

## 2019-04-18 DIAGNOSIS — Z23 Encounter for immunization: Secondary | ICD-10-CM | POA: Insufficient documentation

## 2019-04-18 NOTE — Progress Notes (Signed)
   Covid-19 Vaccination Clinic  Name:  Brandon Mendoza    MRN: [id number] DOB: 1940-05-22  04/18/2019  Brandon Mendoza was observed post Covid-19 immunization for 15 minutes without incidence. He was provided with Vaccine Information Sheet and instruction to access the V-Safe system.   Brandon Mendoza was instructed to call 911 with any severe reactions post vaccine: Marland Kitchen Difficulty breathing  . Swelling of your face and throat  . A fast heartbeat  . A bad rash all over your body  . Dizziness and weakness    Immunizations Administered    Name Date Dose VIS Date Route   Pfizer COVID-19 Vaccine 04/18/2019  8:58 AM 0.3 mL 03/08/2019 Intramuscular   Manufacturer: [employer], [employer]   Lot: [id number]   NDC: [address]-8

## 2019-05-01 ENCOUNTER — Ambulatory Visit (HOSPITAL_COMMUNITY)
Admission: RE | Admit: 2019-05-01 | Discharge: 2019-05-01 | Disposition: A | Discharge status: Home / Self Care | Payer: Medicare Other | Source: Ambulatory Visit | Attending: Surgery | Admitting: Surgery

## 2019-05-01 ENCOUNTER — Ambulatory Visit (INDEPENDENT_AMBULATORY_CARE_PROVIDER_SITE_OTHER)
Admission: RE | Admit: 2019-05-01 | Discharge: 2019-05-01 | Disposition: A | Discharge status: Home / Self Care | Payer: Medicare Other | Source: Ambulatory Visit | Attending: Surgery | Admitting: Surgery

## 2019-05-01 ENCOUNTER — Other Ambulatory Visit: Payer: Self-pay

## 2019-05-01 DIAGNOSIS — I739 Peripheral vascular disease, unspecified: Secondary | ICD-10-CM

## 2019-05-01 DIAGNOSIS — I6522 Occlusion and stenosis of left carotid artery: Secondary | ICD-10-CM

## 2019-05-08 ENCOUNTER — Ambulatory Visit: Payer: Medicare Other | Attending: Internal Medicine

## 2019-05-08 DIAGNOSIS — Z23 Encounter for immunization: Secondary | ICD-10-CM | POA: Insufficient documentation

## 2019-05-08 NOTE — Progress Notes (Signed)
   Covid-19 Vaccination Clinic  Name:  Brandon Mendoza    MRN: [id number] DOB: [birth year]  05/08/2019  Brandon Mendoza was observed post Covid-19 immunization for 15 minutes without incidence. He was provided with Vaccine Information Sheet and instruction to access the V-Safe system.   Brandon Mendoza was instructed to call 911 with any severe reactions post vaccine: Marland Kitchen Difficulty breathing  . Swelling of your face and throat  . A fast heartbeat  . A bad rash all over your body  . Dizziness and weakness    Immunizations Administered    Name Date Dose VIS Date Route   Pfizer COVID-19 Vaccine 05/08/2019  1:18 PM 0.3 mL 03/08/2019 Intramuscular   Manufacturer: [employer], [employer]   Lot: [id number]   NDC: [address]-9

## 2019-05-17 ENCOUNTER — Ambulatory Visit: Payer: Medicare Other

## 2019-05-17 ENCOUNTER — Encounter (HOSPITAL_COMMUNITY): Payer: Medicare Other

## 2019-05-20 ENCOUNTER — Ambulatory Visit (INDEPENDENT_AMBULATORY_CARE_PROVIDER_SITE_OTHER): Payer: Medicare Other | Admitting: Physician Assistant

## 2019-05-20 ENCOUNTER — Ambulatory Visit (HOSPITAL_COMMUNITY)
Admission: RE | Admit: 2019-05-20 | Discharge: 2019-05-20 | Disposition: A | Discharge status: Home / Self Care | Payer: Medicare Other | Source: Ambulatory Visit | Attending: Vascular Surgery | Admitting: Vascular Surgery

## 2019-05-20 ENCOUNTER — Other Ambulatory Visit: Payer: Self-pay

## 2019-05-20 VITALS — BP 129/71 | HR 53 | Resp 18 | Ht 69.0 in | Wt 194.0 lb

## 2019-05-20 DIAGNOSIS — I6522 Occlusion and stenosis of left carotid artery: Secondary | ICD-10-CM | POA: Diagnosis present

## 2019-05-20 DIAGNOSIS — Z9889 Other specified postprocedural states: Secondary | ICD-10-CM

## 2019-05-20 NOTE — Progress Notes (Signed)
Office Note     CC:  follow up Requesting Provider:  Albertina Senegal, MD  HPI: Brandon Mendoza is a 79 y.o. (Jan 28, 1941) male who presents for follow up of carotid stenosis. He is s/p left carotid endarterectomy 12/09/11 by Dr. Edilia Bo for symptomatic carotid stenosis. 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  He was last seen 10/16/17 at which time was doing very well with no new symptoms  Today he denies any TIA or stroke like symptoms. No visual changes, facial drooping, slurred speech, weakness of upper or lower extremities. Does state that occasionally he has noticed increased "drooling"   He remains very active playing racquetball. He has been seen by Orthopedics or some low back and sciatic pain. He also recently over past month has had some right hip pain that radiates into right groin. He saw his PCP who called to have him scheduled for arterial lower extremity evaluation on 05/01/19. His studies were essentially normal. He is not having any lower extremity claudication symptoms. He denies any rest pain or non healing wounds. He has follow up with Orthopedics tomorrow 05/21/19 for evaluation of this new pain.  The pt on a statin for cholesterol management.  The pt not on a daily aspirin due to bruising.   Other AC:  Plavix The pt is on lisinopril for hypertension.   The pt is not diabetic.  Tobacco hx:  Former smoker, quit 2006  Past Medical History:  Diagnosis Date  . Anxiety   . Carotid artery occlusion   . COPD (chronic obstructive pulmonary disease) (HCC)   . Depression   . Hx of colonic polyps   . Hyperlipidemia   . Hypertension   . Nocturia   . Peripheral vascular disease (HCC)   . Sleep apnea    on 6 cm, nasal pillow  . 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 CAROTID ENDARTERECTOMY  12/09/11   Left cea  . ENDARTERECTOMY  12/09/2011   Procedure:  ENDARTERECTOMY CAROTID;  Surgeon: Larina Earthly, MD;  Location: [facility] OR;  Service: Vascular;  Laterality: Left;  . EYE SURGERY     rt retina detachment  . HAMMER TOE SURGERY  2004  . HERNIA REPAIR  2006  . SHOULDER ARTHROSCOPY W/ ROTATOR CUFF REPAIR  2010  . TONSILLECTOMY      Social History   Socioeconomic History  . Marital status: Married    Spouse name: [name]  . Number of children: 4  . Years of education: College  . Highest education level: Not on file  Occupational History  . Occupation: Retired  Tobacco Use  . Smoking status: Former Smoker    Packs/day: 1.00    Years: 50.00    Pack years: 50.00    Types: Cigarettes    Quit date: 08/26/2004    Years since quitting: 14.7  . Smokeless tobacco: Never Used  . Tobacco comment: quit smoking 08/2004  Substance and Sexual Activity  . Alcohol use: Yes    Alcohol/week: 14.0 standard drinks    Types: 14 Shots of liquor per week    Comment: 2-3 daily cocktails  . Drug use: No  . Sexual activity: Not on file  Other Topics Concern  . Not on file  Social History Narrative   Patient is married with 4 children.   Patient is right handed.   Patient has college education.  Patient drinks 3 cups daily.   Social Determinants of Health   Financial Resource Strain:   . Difficulty of Paying Living Expenses: Not on file  Food Insecurity:   . Worried About Charity fundraiser in the Last Year: Not on file  . Ran Out of Food in the Last Year: Not on file  Transportation Needs:   . Lack of Transportation (Medical): Not on file  . Lack of Transportation (Non-Medical): Not on file  Physical Activity:   . Days of Exercise per Week: Not on file  . Minutes of Exercise per Session: Not on file  Stress:   . Feeling of Stress : Not on file  Social Connections:   . Frequency of Communication with Friends and Family: Not on file  . Frequency of Social Gatherings with Friends and Family: Not on file  . Attends Religious Services: Not on file    . Active Member of Clubs or Organizations: Not on file  . Attends Archivist Meetings: Not on file  . Marital Status: Not on file  Intimate Partner Violence:   . Fear of Current or Ex-Partner: Not on file  . Emotionally Abused: Not on file  . Physically Abused: Not on file  . Sexually Abused: Not on file   Family History  Problem Relation Age of Onset  . Alzheimer's disease Father   . Diabetes Other     Current Outpatient Medications  Medication Sig Dispense Refill  . ALPRAZolam (XANAX) 0.5 MG tablet Take 0.5 mg by mouth at bedtime as needed. For anxiety    . Ascorbic Acid (VITAMIN C PO) Take 2 tablets by mouth daily.    . clopidogrel (PLAVIX) 75 MG tablet Take 75 mg by mouth daily with breakfast.    . lisinopril (PRINIVIL,ZESTRIL) 10 MG tablet Take 10 mg by mouth.    . Misc Natural Products (GLUCOSAMINE CHONDROITIN ADV PO) Take 1 tablet by mouth daily.    . Multiple Vitamins-Minerals (MULTIVITAMINS THER. W/MINERALS) TABS Take 1 tablet by mouth daily.    . Omega-3 Fatty Acids (FISH OIL PO) Take 1 capsule by mouth daily.    . Rosuvastatin Calcium (CRESTOR PO) Take by mouth daily.    . sertraline (ZOLOFT) 50 MG tablet TAKE ONE TABLET BY MOUTH ONCE DAILY 30 tablet 0  . simvastatin (ZOCOR) 20 MG tablet Take 20 mg by mouth every evening.      No current facility-administered medications for this visit.    No Known Allergies   REVIEW OF SYSTEMS:   [X]  denotes positive finding, [ ]  denotes negative finding Cardiac  Comments:  Chest pain or chest pressure:    Shortness of breath upon exertion:    Short of breath when lying flat:    Irregular heart rhythm:        Vascular    Pain in calf, thigh, or hip brought on by ambulation:    Pain in feet at night that wakes you up from your sleep:     Blood clot in your veins:    Leg swelling:         Pulmonary    Oxygen at home:    Productive cough:     Wheezing:         Neurologic    Sudden weakness in arms or legs:      Sudden numbness in arms or legs:     Sudden onset of difficulty speaking or slurred speech:    Temporary loss of vision in  one eye:     Problems with dizziness:         Gastrointestinal    Blood in stool:     Vomited blood:         Genitourinary    Burning when urinating:     Blood in urine:        Psychiatric    Major depression:         Hematologic    Bleeding problems:    Problems with blood clotting too easily:        Skin    Rashes or ulcers:        Constitutional    Fever or chills:      PHYSICAL EXAMINATION:  Vitals:   05/20/19 0843  BP: 129/71  Pulse: (!) 53  Resp: 18  SpO2: 98%  Weight: 194 lb (88 kg)  Height: 5\' 9"  (1.753 m)    General:  Well nourished, pleasant, not in any discomfort Gait: normal HENT: WNL, normocephalic Pulmonary: normal non-labored breathing , without wheezing Cardiac: regular HR, without  Murmurs without carotid bruit Abdomen: soft, NT, no masses Skin: without rashes Vascular Exam/Pulses:  Right Left  Radial 2+ (normal) 2+ (normal)  Ulnar 2+ (normal) 2+ (normal)  Femoral 2+ (normal) 2+ (normal)  Popliteal 2+ (normal) 2+ (normal)  DP 2+ (normal) 2+ (normal)  PT 1+ (weak) 1+ (weak)   Extremities: without ischemic changes, without Gangrene , without cellulitis; without open wounds;  Musculoskeletal: no muscle wasting or atrophy  Neurologic: A&O X 3;  No focal weakness or paresthesias are detected Psychiatric:  The pt has Normal affect.   Non-Invasive Vascular Imaging:   05/01/19: Bilateral lower extremity arterial duplex Heterogenous plaque in the bilateral common femoral artery and left mid to distal femoral artery. Normal examination without any arterial occlusive disease. Triphasic to level of popliteal artery bilaterally with biphasic runoff.  05/01/19: Bilateral ABI/TBI R: 1.15/1.54 L: 1.23/1.17  05/20/19 Bilateral Carotid Duplex: Right carotid with velocities consistent with 1-39%. Left carotid patent s/p left  carotid endarterectomy with no evidence of restenosis. Bilateral vertebral arteries with antegrade flow. Normal flow hemodynamics in bilateral subclavian arteries   ASSESSMENT/PLAN:: 79 y.o. male here for follow up for carotid stenosis. He is s/p left carotid endarterectomy. He remains asymptomatic. His lower extremity arterial disease evaluation is essentially normal both clinically and on non invasive evaluation. His right hip pain is likely musculoskeletal. He will see Orthopedics tomorrow for evaluation of this.  - He will continue his Plavix and Crestor - He will follow up in 1 year for carotid duplex - Discussed with him signs and symptoms of TIA/stroke and advised him should any of these occur he should go to the emergency room     76, PA-C [facility] [phone]  Clinic MD:  Dr. [phone]

## 2019-05-21 ENCOUNTER — Other Ambulatory Visit: Payer: Self-pay | Admitting: *Deleted

## 2019-05-21 DIAGNOSIS — Z9889 Other specified postprocedural states: Secondary | ICD-10-CM

## 2019-06-20 ENCOUNTER — Other Ambulatory Visit: Payer: Self-pay

## 2019-06-20 ENCOUNTER — Ambulatory Visit: Payer: Medicare Other | Attending: Orthopedic Surgery

## 2019-06-20 DIAGNOSIS — M6281 Muscle weakness (generalized): Secondary | ICD-10-CM | POA: Insufficient documentation

## 2019-06-20 DIAGNOSIS — R2689 Other abnormalities of gait and mobility: Secondary | ICD-10-CM | POA: Diagnosis present

## 2019-06-20 DIAGNOSIS — M25551 Pain in right hip: Secondary | ICD-10-CM | POA: Insufficient documentation

## 2019-06-20 NOTE — Therapy (Signed)
[facility] [address] [place], [place], [address] Phone: [phone]   Fax:  [phone]  Physical Therapy Evaluation  Patient Details  Name: Brandon Mendoza MRN: [id number] Date of Birth: 01-02-1941 Referring Provider (PT): [place]   Encounter Date: 06/20/2019  PT End of Session - 06/20/19 1246    Visit Number  1    Number of Visits  6    Date for PT Re-Evaluation  07/25/19    Authorization Type  Medicare Part A and B    PT Start Time  1050    PT Stop Time  1135    PT Time Calculation (min)  45 min    Activity Tolerance  Patient tolerated treatment well    Behavior During Therapy  [facility] for tasks assessed/performed       Past Medical History:  Diagnosis Date  . Anxiety   . Carotid artery occlusion   . COPD (chronic obstructive pulmonary disease) ([place])   . Depression   . Hx of colonic polyps   . Hyperlipidemia   . Hypertension   . Nocturia   . Peripheral vascular disease ([place])   . Sleep apnea    on 6 cm, nasal pillow  . Stroke ([place]) 05/01/2011   ischemic    Past Surgical History:  Procedure Laterality Date  . CAROTID ENDARTERECTOMY  12/09/11   Left cea  . ENDARTERECTOMY  12/09/2011   Procedure: ENDARTERECTOMY CAROTID;  Surgeon: Rosetta Posner, MD;  Location: [facility];  Service: Vascular;  Laterality: Left;  . EYE SURGERY     rt retina detachment  . [place]  2004  . HERNIA REPAIR  2006  . SHOULDER ARTHROSCOPY W/ ROTATOR CUFF REPAIR  2010  . TONSILLECTOMY      There were no vitals filed for this visit.   Subjective Assessment - 06/20/19 1055    Subjective  Pt reports he recently saw his MD again who gave him a shot in his R inner thigh where he was having pain and "it hurt really bad but really helped". He began having some right groin pain recently ~ 2 mo ago, and Meloxicam has additionally helped a lot. Sometimes, he feels like his right leg is going to collapse, like it's not strong enough  to hold him up - temporary but bothersome.    Pertinent History  Low back pain    Limitations  Other (comment)   lying in supine which helped after having CSI in low back from Dr. Nelva Bush   How long can you sit comfortably?  Can sit as long as he wants    How long can you stand comfortably?  30 minutes    How long can you walk comfortably?  10 minutes    Diagnostic tests  XR and MRI    Patient Stated Goals  to eliminate the pain    Currently in Pain?  No/denies    Pain Location  Hip    Pain Orientation  Right    Pain Descriptors / Indicators  Aching    Pain Type  Acute pain    Pain Radiating Towards  R groin    Pain Onset  More than a month ago   2 months ago   Pain Frequency  Intermittent    Aggravating Factors   R hip flexion, stairs, supine    Pain Relieving Factors  Meloxicam, CSI, stretching in the morning, sidelying  [facility] PT Assessment - 06/20/19 0001      Assessment   Medical Diagnosis  Right Hip Pain    Referring Provider (PT)  [place]    Onset Date/Surgical Date  04/22/19    Next MD Visit  8 weeks    Prior Therapy  for R shoulder       Balance Screen   Has the patient fallen in the past 6 months  Yes    How many times?  Multiple falls in the woods, archery hunting, tripping over roots/branches    Has the patient had a decrease in activity level because of a fear of falling?   Yes   a little   Is the patient reluctant to leave their home because of a fear of falling?   No      Prior Function   Level of Independence  Independent    Vocation  Retired    Leisure  Raquetball, walking      ROM / Strength   AROM / PROM / Strength  AROM;Strength      AROM   Overall AROM   Deficits    AROM Assessment Site  Hip    Right/Left Hip  Right;Left    Right Hip Flexion  95   PROM: 100 no pain, pain with PROM   Right Hip External Rotation   57   mild pain   Right Hip Internal Rotation   43   mild pain   Left Hip Flexion  110   115 PROM   Left Hip External  Rotation   56    Left Hip Internal Rotation   43      Strength   Overall Strength  Deficits    Overall Strength Comments  Weak hip abductors, internal/external rotators B    Strength Assessment Site  Hip    Right/Left Hip  Right;Left    Right Hip Flexion  3+/5   pain   Right Hip Extension  3-/5    Right Hip External Rotation   3+/5    Right Hip Internal Rotation  3+/5    Right Hip ABduction  3+/5    Left Hip Flexion  4-/5    Left Hip Extension  3-/5    Left Hip External Rotation  5/5    Left Hip Internal Rotation  3+/5    Left Hip ABduction  3+/5      Flexibility   Soft Tissue Assessment /Muscle Length  yes    Hamstrings  TTP and soft tissue restriction to R lateral HS    Quadriceps  hypoextensible quads B esp VL   restriction B quadriceps limiting hip extension   ITB  hypoextensible      Special Tests    Special Tests  Hip Special Tests    Hip Special Tests   Saralyn Pilar (FABER) Test;Hip Scouring;Other;Erskine Squibb (FABER) Test   Findings  Negative    Side  Right;Left      Hip Scouring   Findings  Positive    Side  Right    Comments  into IR      other   Findings  Positive    Side  Right    Comments  Painful resisted SLR       other   Findings  Positive    Side  Right    Comments  Pain into FADIR                Objective  measurements completed on examination: See above findings.      [facility] Adult PT Treatment/Exercise - 06/20/19 0001      Exercises   Exercises  Knee/Hip      Knee/Hip Exercises: Supine   Bridges  AROM;Strengthening;Both;1 set;10 reps    Bridges Limitations  10" hold, unable to feel glute activation      Knee/Hip Exercises: Sidelying   Clams  10x10" with MET for hip abductor/ER activation             PT Education - 06/20/19 1245    Education Details  Pt educated on HEP and importance of strengthening glutes, as well as presence of hip flexor tightness.    Methods  Explanation;Demonstration;Handout;Verbal  cues;Tactile cues    Comprehension  Verbalized understanding;Returned demonstration       PT Short Term Goals - 06/20/19 1346      PT SHORT TERM GOAL #1   Title  Pt will increase hip extension MMT to at least 4/5.    Baseline  3-/5    Time  3    Period  Weeks    Status  New    Target Date  07/11/19      PT SHORT TERM GOAL #2   Title  Pt will increase hip abduction MMT to at least 4/5.    Baseline  3+/5    Time  2    Period  Weeks    Status  New    Target Date  07/04/19      PT SHORT TERM GOAL #3   Title  Pt will increase hip ER/IR strength to at least 4/5.    Baseline  3+/5    Time  3    Period  Weeks    Status  New    Target Date  07/11/19      PT SHORT TERM GOAL #4   Title  Pt will be compliant with initial HEP.    Time  1    Period  Weeks    Status  New    Target Date  06/27/19        PT Long Term Goals - 06/20/19 1348      PT LONG TERM GOAL #1   Title  Pt will demonstrate hip mobility WFL with less than 3/10 pain for participation in community activity.    Time  4    Period  Weeks    Status  New    Target Date  07/18/19      PT LONG TERM GOAL #2   Title  Pt will be compliant and understand advanced HEP with progressions for maintenance and continued strengthening post physical therapy.    Time  4    Period  Weeks    Status  New    Target Date  07/18/19      PT LONG TERM GOAL #3   Title  Pt will negotiate steps with no reported increase in pain for home and gym access.    Time  4    Period  Weeks    Status  New    Target Date  07/18/19             Plan - 06/20/19 1339    Clinical Impression Statement  Pt is an 79 yo male who presents with s/s consistent with early-mid stage R hip OA. Pt demonstrates quad/hip flexor dominance, limiting B hip extension and gluteal recruitment with notable weakness to hip external/internal rotators and abductors B.  Pt was educated on plan of care, HEP, diagnosis, prognosis and progression with physical therapy.  He verbalized understanding and consented to treatment. He will benefit from a round of skilled physical therapy to improve strength, body mechanics, flexiblity, and knowledge of diagnosis for increased body awareness.    Personal Factors and Comorbidities  Comorbidity 3+;Age    Examination-Activity Limitations  Locomotion Level;Bend;Stairs;Squat    Examination-Participation Restrictions  Other;Community Activity   fitness   Stability/Clinical Decision Making  Stable/Uncomplicated    Clinical Decision Making  Low    Rehab Potential  Good    PT Frequency  2x / week   2x/week for 1st weeks then 1x/week for 2 weeks   PT Duration  4 weeks    PT Treatment/Interventions  ADLs/Self Care Home Management;Electrical Stimulation;Cryotherapy;Joint Manipulations;Vasopneumatic Device;Taping;Manual techniques;Passive range of motion;Patient/family education;Neuromuscular re-education;Gait training;Stair training;Iontophoresis 16m/ml Dexamethasone;Moist Heat;Therapeutic activities;Therapeutic exercise;Balance training;Functional mobility training    PT Next Visit Plan  Assess HEP compliance and understanding, progress glute strength, decrease myofascial restrictions, increase hip mobility and flexibility    PT Home Exercise Plan  Bridges, clams    Consulted and Agree with Plan of Care  Patient       Patient will benefit from skilled therapeutic intervention in order to improve the following deficits and impairments:  Postural dysfunction, Decreased strength, Decreased mobility, Decreased balance, Impaired flexibility, Improper body mechanics, Pain, Decreased range of motion, Difficulty walking, Increased fascial restricitons  Visit Diagnosis: Muscle weakness (generalized) - Plan: PT plan of care cert/re-cert  Pain in right hip - Plan: PT plan of care cert/re-cert  Other abnormalities of gait and mobility - Plan: PT plan of care cert/re-cert     Problem List Patient Active Problem List   Diagnosis  Date Noted  . OSA on CPAP 06/20/2018  . Anxiety 04/10/2014  . 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 04/10[place]  . History of carotid endarterectomy 04/10/2014  . Essential hypertension 04/10/2014  . [place]  . Numbness- Left neck 03/13/2013  . Occlusion and stenosis of carotid artery without mention of cerebral infarction 12/15/2011  . HTN (hypertension), malignant 12/01/2011  . Habitual alcohol use 12/01/2011  . Carotid artery stenosis, symptomatic 11/30/2011  . CVA (cerebral infarction) 11/29/2011  . Hyperlipidemia     KIzell Mounds PT, DPT 06/20/2019, 1:54 PM  [address] [address]Phone: [phone]  Fax:  [phone] Name: JMONTARIUS KITAGAWAMRN: [id number] of Birth: 3July 31, 1942

## 2019-07-02 ENCOUNTER — Ambulatory Visit: Payer: Medicare Other | Attending: Orthopedic Surgery | Admitting: Physical Therapy

## 2019-07-02 ENCOUNTER — Other Ambulatory Visit: Payer: Self-pay

## 2019-07-02 ENCOUNTER — Encounter: Payer: Self-pay | Admitting: Physical Therapy

## 2019-07-02 DIAGNOSIS — M25551 Pain in right hip: Secondary | ICD-10-CM | POA: Insufficient documentation

## 2019-07-02 DIAGNOSIS — M6281 Muscle weakness (generalized): Secondary | ICD-10-CM

## 2019-07-02 DIAGNOSIS — R2689 Other abnormalities of gait and mobility: Secondary | ICD-10-CM | POA: Diagnosis present

## 2019-07-02 NOTE — Therapy (Signed)
[facility] [place] [address] [place], [place], [address] Phone: [phone]   Fax:  [phone]  Physical Therapy Treatment  Patient Details  Name: Brandon Mendoza MRN: [id number] Date of Birth: 1940/03/30 Referring Provider (PT): Swinte[place]   Encounter Date: 07/02/2019  PT End of Session - 07/02/19 1557    Visit Number  2    Number of Visits  6    Date for PT Re-Evaluation  07/25/19    Authorization Type  Medicare Part A and B    PT Start Time  1515    PT Stop Time  1605    PT Time Calculation (min)  50 min    Activity Tolerance  Patient tolerated treatment well    Behavior During Therapy  [facility] for tasks assessed/performed       Past Medical History:  Diagnosis Date  . Anxiety   . Carotid artery occlusion   . COPD (chronic obstructive pulmonary disease) ([facility])   . Depression   . Hx of colonic polyps   . Hyperlipidemia   . Hypertension   . Nocturia   . Peripheral vascular disease ([facility])   . Sleep apnea    on 6 cm, nasal pillow  . 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 CAROTID ENDARTERECTOMY  12/09/11   Left cea  . ENDARTERECTOMY  12/09/2011   Procedure: ENDARTERECTOMY CAROTID;  Surgeon: Larina Earthly, MD;  Location: [facility] OR;  Service: Vascular;  Laterality: Left;  . EYE SURGERY     rt retina detachment  . HAMMER TOE SURGERY  2004  . HERNIA REPAIR  2006  . SHOULDER ARTHROSCOPY W/ ROTATOR CUFF REPAIR  2010  . TONSILLECTOMY      There were no vitals filed for this visit.  Subjective Assessment - 07/02/19 1516    Subjective  The pain has come back a little, has not been religous with HEP    Currently in Pain?  No/denies                       [facility] Adult PT Treatment/Exercise - 07/02/19 0001      Knee/Hip Exercises: Stretches   Passive Hamstring Stretch  5 reps;10 seconds;Right    Piriformis Stretch  Right;4 reps;10 seconds    Other Knee/Hip  Stretches  Single K2C stretch R       Knee/Hip Exercises: Aerobic   Nustep  L5 x 6 min      Knee/Hip Exercises: Machines for Strengthening   Cybex Knee Extension  20lb 2x15     Cybex Knee Flexion  35lb 2x10      Knee/Hip Exercises: Standing   Forward Step Up  Right;2 sets;10 reps;Hand Hold: 0;Step Height: 6";Step Height: 8"    Walking with Sports Cord  40lb 4 way x 3 each       Knee/Hip Exercises: Supine   Bridges  AROM;Strengthening;Both;1 set;10 reps      Modalities   Modalities  Moist Heat      Moist Heat Therapy   Number Minutes Moist Heat  10 Minutes    Moist Heat Location  Other (comment)   R groin     Manual Therapy   Manual Therapy  Passive ROM;Soft tissue mobilization    Soft tissue mobilization  R groin    Passive ROM  R hip with abduction stretch  PT Short Term Goals - 07/02/19 1601      PT SHORT TERM GOAL #1   Title  Pt will increase hip extension MMT to at least 4/5.        PT Long Term Goals - 06/20/19 1348      PT LONG TERM GOAL #1   Title  Pt will demonstrate hip mobility WFL with less than 3/10 pain for participation in community activity.    Time  4    Period  Weeks    Status  New    Target Date  07/18/19      PT LONG TERM GOAL #2   Title  Pt will be compliant and understand advanced HEP with progressions for maintenance and continued strengthening post physical therapy.    Time  4    Period  Weeks    Status  New    Target Date  07/18/19      PT LONG TERM GOAL #3   Title  Pt will negotiate steps with no reported increase in pain for home and gym access.    Time  4    Period  Weeks    Status  New    Target Date  07/18/19            Plan - 07/02/19 1557    Clinical Impression Statement  Pt did well with the initial exercise interventions. Some instability noted with step up. he reports his biggest issues it lifting his knee(flexing hip), no issues with step ups. Cue needed throughout session to slow down and  focus on a good muscle contraction. R Hs abd piriformis tightness noted with passive stretching. Tenderness reported in the R groin with some palpation. Pt reports going to the gym 4 days a week and doing the machines.One of the machines he reports doing is seated hip adduction. Advised pt too cut back on this particular exercise to only once a week at most   Personal Factors and Comorbidities  Comorbidity 3+;Age    Examination-Activity Limitations  Locomotion Level;Bend;Stairs;Squat    Examination-Participation Restrictions  Other;Community Activity    Stability/Clinical Decision Making  Stable/Uncomplicated    Rehab Potential  Good    PT Frequency  2x / week    PT Duration  4 weeks    PT Treatment/Interventions  ADLs/Self Care Home Management;Electrical Stimulation;Cryotherapy;Joint Manipulations;Vasopneumatic Device;Taping;Manual techniques;Passive range of motion;Patient/family education;Neuromuscular re-education;Gait training;Stair training;Iontophoresis 4mg /ml Dexamethasone;Moist Heat;Therapeutic activities;Therapeutic exercise;Balance training;Functional mobility training    PT Next Visit Plan  Assess HEP compliance and understanding, progress glute strength, decrease myofascial restrictions, increase hip mobility and flexibility       Patient will benefit from skilled therapeutic intervention in order to improve the following deficits and impairments:  Postural dysfunction, Decreased strength, Decreased mobility, Decreased balance, Impaired flexibility, Improper body mechanics, Pain, Decreased range of motion, Difficulty walking, Increased fascial restricitons  Visit Diagnosis: Pain in right hip  Other abnormalities of gait and mobility  Muscle weakness (generalized)     Problem List Patient Active Problem List   Diagnosis Date Noted  . OSA on CPAP 06/20/2018  . Anxiety 04/10/2014  . 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 History of  carotid endarterectomy 04/10/2014  . Essential hypertension 04/10/2014  . Aftercare following surgery of the circulatory system, [place] 03/13/2013  . Numbness- Left neck 03/13/2013  . Occlusion and stenosis of carotid artery without mention of cerebral infarction 12/15/2011  . HTN (hypertension), malignant 12/01/2011  . Habitual  alcohol use 12/01/2011  . Carotid artery stenosis, symptomatic 11/30/2011  . CVA (cerebral infarction) 11/29/2011  . Hyperlipidemia     Grayce Sessions, PTA 07/02/2019, 4:02 PM  [facility] [address] [place], [place], [address] Phone: [phone]   Fax:  [phone]  Name: Brandon Mendoza MRN: [id number] Date of Birth: 19-Jun-1940

## 2019-07-04 ENCOUNTER — Encounter: Payer: Self-pay | Admitting: Physical Therapy

## 2019-07-04 ENCOUNTER — Other Ambulatory Visit: Payer: Self-pay

## 2019-07-04 ENCOUNTER — Ambulatory Visit: Payer: Medicare Other | Admitting: Physical Therapy

## 2019-07-04 DIAGNOSIS — M6281 Muscle weakness (generalized): Secondary | ICD-10-CM

## 2019-07-04 DIAGNOSIS — M25551 Pain in right hip: Secondary | ICD-10-CM | POA: Diagnosis not present

## 2019-07-04 DIAGNOSIS — R2689 Other abnormalities of gait and mobility: Secondary | ICD-10-CM

## 2019-07-04 NOTE — Therapy (Signed)
[facility] [place] [address] [place], [place], [address] Phone: [phone]   Fax:  [phone]  Physical Therapy Treatment  Patient Details  Name: Brandon Mendoza MRN: [id number] Date of Birth: [birth year] Referring Provider (PT): Swinte[place]   Encounter Date: 07/04/2019  PT End of Session - 07/04/19 1553    Visit Number  3    Date for PT Re-Evaluation  07/25/19    Authorization Type  Medicare Part A and B    PT Start Time  1515    PT Stop Time  1601    PT Time Calculation (min)  46 min    Activity Tolerance  Patient tolerated treatment well    Behavior During Therapy  [facility] for tasks assessed/performed       Past Medical History:  Diagnosis Date  . Anxiety   . Carotid artery occlusion   . COPD (chronic obstructive pulmonary disease) ([facility])   . Depression   . Hx of colonic polyps   . Hyperlipidemia   . Hypertension   . Nocturia   . Peripheral vascular disease ([facility])   . Sleep apnea    on 6 cm, nasal pillow  . 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 CAROTID ENDARTERECTOMY  12/09/11   Left cea  . ENDARTERECTOMY  12/09/2011   Procedure: ENDARTERECTOMY CAROTID;  Surgeon: Larina Earthly, MD;  Location: [facility] OR;  Service: Vascular;  Laterality: Left;  . EYE SURGERY     rt retina detachment  . HAMMER TOE SURGERY  2004  . HERNIA REPAIR  2006  . SHOULDER ARTHROSCOPY W/ ROTATOR CUFF REPAIR  2010  . TONSILLECTOMY      There were no vitals filed for this visit.  Subjective Assessment - 07/04/19 1514    Subjective  Just a little sore in the groin area    Currently in Pain?  No/denies                       [facility] Adult PT Treatment/Exercise - 07/04/19 0001      Knee/Hip Exercises: Aerobic   Recumbent Bike  L3 x 4 min     Nustep  L5 x 6 min      Knee/Hip Exercises: Machines for Strengthening   Cybex Knee Extension  20lb 2x15     Cybex Knee Flexion  35lb  2x10    Cybex Leg Press  30lb 2x10       Modalities   Modalities  Moist Heat      Moist Heat Therapy   Number Minutes Moist Heat  10 Minutes    Moist Heat Location  Other (comment)   R groin     Manual Therapy   Manual Therapy  Passive ROM;Soft tissue mobilization    Soft tissue mobilization  R groin    Passive ROM  R hip abduction to stretch adductiors                PT Short Term Goals - 07/04/19 1557      PT SHORT TERM GOAL #4   Title  Pt will be compliant with initial HEP.    Status  Achieved        PT Long Term Goals - 06/20/19 1348      PT LONG TERM GOAL #1   Title  Pt will demonstrate hip mobility WFL with less than 3/10 pain for participation  in community activity.    Time  4    Period  Weeks    Status  New    Target Date  07/18/19      PT LONG TERM GOAL #2   Title  Pt will be compliant and understand advanced HEP with progressions for maintenance and continued strengthening post physical therapy.    Time  4    Period  Weeks    Status  New    Target Date  07/18/19      PT LONG TERM GOAL #3   Title  Pt will negotiate steps with no reported increase in pain for home and gym access.    Time  4    Period  Weeks    Status  New    Target Date  07/18/19            Plan - 07/04/19 1554    Clinical Impression Statement  Pt does well functionally. He reports the most in R groin when lifting his foot to get into his car. He is very active playing racquet ball and lifting weights. Some R groin tightness noted with MT and stretching. Positive response to MT. He reports that's he did stop the hip adduction machine.    Personal Factors and Comorbidities  Comorbidity 3+;Age    Examination-Activity Limitations  Locomotion Level;Bend;Stairs;Squat    Examination-Participation Restrictions  Other;Community Activity    Stability/Clinical Decision Making  Stable/Uncomplicated    Rehab Potential  Good    PT Frequency  2x / week    PT Duration  4 weeks    PT  Treatment/Interventions  ADLs/Self Care Home Management;Electrical Stimulation;Cryotherapy;Joint Manipulations;Vasopneumatic Device;Taping;Manual techniques;Passive range of motion;Patient/family education;Neuromuscular re-education;Gait training;Stair training;Iontophoresis 4mg /ml Dexamethasone;Moist Heat;Therapeutic activities;Therapeutic exercise;Balance training;Functional mobility training    PT Next Visit Plan  progress glute strength, decrease myofascial restrictions, increase hip mobility and flexibility       Patient will benefit from skilled therapeutic intervention in order to improve the following deficits and impairments:  Postural dysfunction, Decreased strength, Decreased mobility, Decreased balance, Impaired flexibility, Improper body mechanics, Pain, Decreased range of motion, Difficulty walking, Increased fascial restricitons  Visit Diagnosis: Pain in right hip  Other abnormalities of gait and mobility  Muscle weakness (generalized)     Problem List Patient Active Problem List   Diagnosis Date Noted  . OSA on CPAP 06/20/2018  . Anxiety 04/10/2014  . 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 History of carotid endarterectomy 04/10/2014  . Essential hypertension 04/10/2014  . Aftercare following surgery of the circulatory system, [place] 03/13/2013  . Numbness- Left neck 03/13/2013  . Occlusion and stenosis of carotid artery without mention of cerebral infarction 12/15/2011  . HTN (hypertension), malignant 12/01/2011  . Habitual alcohol use 12/01/2011  . Carotid artery stenosis, symptomatic 11/30/2011  . CVA (cerebral infarction) 11/29/2011  . Hyperlipidemia     Scot Jun, PTA 07/04/2019, 3:58 PM  [address] [place], [place], [address] Phone: [phone]   Fax:  [phone]  Name: Brandon Mendoza MRN: [id number] Date of Birth: [birth year]

## 2019-07-09 ENCOUNTER — Ambulatory Visit: Payer: Medicare Other | Admitting: Physical Therapy

## 2019-07-09 ENCOUNTER — Other Ambulatory Visit: Payer: Self-pay

## 2019-07-09 ENCOUNTER — Encounter: Payer: Self-pay | Admitting: Physical Therapy

## 2019-07-09 DIAGNOSIS — M25551 Pain in right hip: Secondary | ICD-10-CM

## 2019-07-09 DIAGNOSIS — M6281 Muscle weakness (generalized): Secondary | ICD-10-CM

## 2019-07-09 DIAGNOSIS — R2689 Other abnormalities of gait and mobility: Secondary | ICD-10-CM

## 2019-07-09 NOTE — Therapy (Signed)
[address] [place], [place], [address] Phone: [phone]   Fax:  [phone]  Physical Therapy Treatment  Patient Details  Name: Brandon Mendoza MRN: [id number] Date of Birth: [birth year] Referring Provider (PT): [place]   Encounter Date: 07/09/2019  PT End of Session - 07/09/19 1552    Visit Number  4    Date for PT Re-Evaluation  07/25/19    Authorization Type  Medicare Part A and B    PT Start Time  9480    PT Stop Time  1602    PT Time Calculation (min)  51 min       Past Medical History:  Diagnosis Date  . Anxiety   . Carotid artery occlusion   . COPD (chronic obstructive pulmonary disease) ([place])   . Depression   . Hx of colonic polyps   . Hyperlipidemia   . Hypertension   . Nocturia   . Peripheral vascular disease ([place])   . Sleep apnea    on 6 cm, nasal pillow  . Stroke ([place]) 05/01/2011   ischemic    Past Surgical History:  Procedure Laterality Date  . CAROTID ENDARTERECTOMY  12/09/11   Left cea  . ENDARTERECTOMY  12/09/2011   Procedure: ENDARTERECTOMY CAROTID;  Surgeon: Rosetta Posner, MD;  Location: [facility];  Service: Vascular;  Laterality: Left;  . EYE SURGERY     rt retina detachment  . [place]  2004  . HERNIA REPAIR  2006  . SHOULDER ARTHROSCOPY W/ ROTATOR CUFF REPAIR  2010  . TONSILLECTOMY      There were no vitals filed for this visit.  Subjective Assessment - 07/09/19 1513    Subjective  Pt reports playing racquet ball Friday, went home and the lateal part of R hip has started bugging him. No pain reported on the lateral R hip.    Currently in Pain?  Yes    Pain Score  4     Pain Location  Hip    Pain Orientation  Right                       [facility] Adult PT Treatment/Exercise - 07/09/19 0001      Knee/Hip Exercises: Stretches   Passive Hamstring Stretch  5 reps;10 seconds;Right    ITB Stretch  Right;3 reps;10 seconds    Piriformis Stretch   Right;4 reps;10 seconds    Other Knee/Hip Stretches  Single K2C stretch R       Knee/Hip Exercises: Aerobic   Recumbent Bike  L2 x 5 min     Nustep  L5 x 6 min      Knee/Hip Exercises: Supine   Bridges  AROM;Strengthening;Both;1 set;10 reps    Bridges with [employer]  2 sets;10 reps;Both    Bridges with Cl[employer]  Both;10 reps;2 sets    Other Supine Knee/Hip Exercises  Bridge & march 3x5       Modalities   Modalities  Moist Heat      Moist Heat Therapy   Number Minutes Moist Heat  10 Minutes    Moist Heat Location  Other (comment)   R groin and lateral R thigh     Manual Therapy   Manual Therapy  Passive ROM;Soft tissue mobilization    Soft tissue mobilization  R groin    Passive ROM  R hip abduction to stretch adductiors  PT Short Term Goals - 07/04/19 1557      PT SHORT TERM GOAL #4   Title  Pt will be compliant with initial HEP.    Status  Achieved        PT Long Term Goals - 07/09/19 1556      PT LONG TERM GOAL #1   Title  Pt will demonstrate hip mobility WFL with less than 3/10 pain for participation in community activity.    Status  Partially Met      PT LONG TERM GOAL #2   Title  Pt will be compliant and understand advanced HEP with progressions for maintenance and continued strengthening post physical therapy.    Status  Partially Met      PT LONG TERM GOAL #3   Title  Pt will negotiate steps with no reported increase in pain for home and gym access.    Status  On-going            Plan - 07/09/19 1552    Clinical Impression Statement  Pt enters clinic reporting increase lateral R hip and thigh pain. He reports that this started Friday after he went to the gym. He reports difficulty lifting his knee, and a sense of his RLE will go out on him.  He was able to complete all supine interventions. R groin tissue is less dense compared to previous sessions. Pain reported with passive R hip IR.    Personal Factors and Comorbidities   Comorbidity 3+;Age    Examination-Activity Limitations  Locomotion Level;Bend;Stairs;Squat    Examination-Participation Restrictions  Other;Community Activity    Stability/Clinical Decision Making  Stable/Uncomplicated    Rehab Potential  Good    PT Frequency  2x / week    PT Duration  4 weeks    PT Next Visit Plan  progress glute strength, decrease myofascial restrictions, increase hip mobility and flexibility       Patient will benefit from skilled therapeutic intervention in order to improve the following deficits and impairments:  Postural dysfunction, Decreased strength, Decreased mobility, Decreased balance, Impaired flexibility, Improper body mechanics, Pain, Decreased range of motion, Difficulty walking, Increased fascial restricitons  Visit Diagnosis: Pain in right hip  Other abnormalities of gait and mobility  Muscle weakness (generalized)     Problem List Patient Active Problem List   Diagnosis Date Noted  . OSA on CPAP 06/20/2018  . Anxiety 04/10/2014  . 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 History of carotid endarterectomy 04/10/2014  . Essential hypertension 04/10/2014  . Aftercare following surgery of the circulatory system, [place] 03/13/2013  . Numbness- Left neck 03/13/2013  . Occlusion and stenosis of carotid artery without mention of cerebral infarction 12/15/2011  . HTN (hypertension), malignant 12/01/2011  . Habitual alcohol use 12/01/2011  . Carotid artery stenosis, symptomatic 11/30/2011  . CVA (cerebral infarction) 11/29/2011  . Hyperlipidemia     [place], PTA 07/09/2019, 3:57 PM  [address] [place], [place], [address] Phone: [phone]   Fax:  [phone]  Name: Brandon Mendoza MRN: [id number] Date of Birth: [birth year]

## 2019-07-10 DIAGNOSIS — S76011A Strain of muscle, fascia and tendon of right hip, initial encounter: Secondary | ICD-10-CM | POA: Insufficient documentation

## 2019-07-11 ENCOUNTER — Ambulatory Visit: Payer: Medicare Other | Admitting: Physical Therapy

## 2019-08-05 ENCOUNTER — Telehealth: Payer: Self-pay | Admitting: Family Medicine

## 2019-08-05 NOTE — Telephone Encounter (Signed)
RE: cpap Received: Today Message Contents  Barbaraann Faster, RN; Henderson Newcomer; Teena Dunk I have forwarded your message to our RT team to review and advise.     Previous Messages   ----- Message -----  From: Guy Begin, RN  Sent: 08/05/2019 11:14 AM EDT  To: Henderson Newcomer, Teena Dunk  Subject: cpap                       [name] 11-25-1940. Pt has appt 08-13-19 with [place] for his cpap. He is listed twice in [place], and has 2 machines. The most recent is no tactive, but have the old one having his most recent information. He got new machine in February lat year. 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

## 2019-08-05 NOTE — Telephone Encounter (Signed)
Received message back from DME that received message and will look into.

## 2019-08-05 NOTE — Telephone Encounter (Signed)
Patient called to verify if he would need to bring his cpap machine to upcoming apt

## 2019-08-05 NOTE — Telephone Encounter (Signed)
I called pt and relayed that he uses [facility].  He is using new machine since February 2020 I believe.  [facility] states using old machine??  I messagee [facility].

## 2019-08-12 DIAGNOSIS — R260 Ataxic gait: Secondary | ICD-10-CM | POA: Insufficient documentation

## 2019-08-13 ENCOUNTER — Other Ambulatory Visit: Payer: Self-pay

## 2019-08-13 ENCOUNTER — Ambulatory Visit (INDEPENDENT_AMBULATORY_CARE_PROVIDER_SITE_OTHER): Payer: Medicare Other | Admitting: Family Medicine

## 2019-08-13 ENCOUNTER — Encounter: Payer: Self-pay | Admitting: Family Medicine

## 2019-08-13 VITALS — BP 147/88 | HR 56 | Ht 68.0 in | Wt 194.0 lb

## 2019-08-13 DIAGNOSIS — G4733 Obstructive sleep apnea (adult) (pediatric): Secondary | ICD-10-CM

## 2019-08-13 DIAGNOSIS — Z9989 Dependence on other enabling machines and devices: Secondary | ICD-10-CM | POA: Diagnosis not present

## 2019-08-13 DIAGNOSIS — I499 Cardiac arrhythmia, unspecified: Secondary | ICD-10-CM | POA: Diagnosis not present

## 2019-08-13 NOTE — Patient Instructions (Signed)
Please continue using your CPAP regularly. While your insurance requires that you use CPAP at least 4 hours each night on 70% of the nights, I recommend, that you not skip any nights and use it throughout the night if you can. Getting used to CPAP and staying with the treatment long term does take time and patience and discipline. Untreated obstructive sleep apnea when it is moderate to severe can have an adverse impact on cardiovascular health and raise her risk for heart disease, arrhythmias, hypertension, congestive heart failure, stroke and diabetes. Untreated obstructive sleep apnea causes sleep disruption, nonrestorative sleep, and sleep deprivation. This can have an impact on your day to day functioning and cause daytime sleepiness and impairment of cognitive function, memory loss, mood disturbance, and problems focussing. Using CPAP regularly can improve these symptoms.   Follow up in 1 year   Sleep Apnea Sleep apnea affects breathing during sleep. It causes breathing to stop for a short time or to become shallow. It can also increase the risk of:  Heart attack.  Stroke.  Being very overweight (obese).  Diabetes.  Heart failure.  Irregular heartbeat. The goal of treatment is to help you breathe normally again. What are the causes? There are three kinds of sleep apnea:  Obstructive sleep apnea. This is caused by a blocked or collapsed airway.  Central sleep apnea. This happens when the brain does not send the right signals to the muscles that control breathing.  Mixed sleep apnea. This is a combination of obstructive and central sleep apnea. The most common cause of this condition is a collapsed or blocked airway. This can happen if:  Your throat muscles are too relaxed.  Your tongue and tonsils are too large.  You are overweight.  Your airway is too small. What increases the risk?  Being overweight.  Smoking.  Having a small airway.  Being older.  Being  male.  Drinking alcohol.  Taking medicines to calm yourself (sedatives or tranquilizers).  Having family members with the condition. What are the signs or symptoms?  Trouble staying asleep.  Being sleepy or tired during the day.  Getting angry a lot.  Loud snoring.  Headaches in the morning.  Not being able to focus your mind (concentrate).  Forgetting things.  Less interest in sex.  Mood swings.  Personality changes.  Feelings of sadness (depression).  Waking up a lot during the night to pee (urinate).  Dry mouth.  Sore throat. How is this diagnosed?  Your medical history.  A physical exam.  A test that is done when you are sleeping (sleep study). The test is most often done in a sleep lab but may also be done at home. How is this treated?   Sleeping on your side.  Using a medicine to get rid of mucus in your nose (decongestant).  Avoiding the use of alcohol, medicines to help you relax, or certain pain medicines (narcotics).  Losing weight, if needed.  Changing your diet.  Not smoking.  Using a machine to open your airway while you sleep, such as: ? An oral appliance. This is a mouthpiece that shifts your lower jaw forward. ? A CPAP device. This device blows air through a mask when you breathe out (exhale). ? An EPAP device. This has valves that you put in each nostril. ? A BPAP device. This device blows air through a mask when you breathe in (inhale) and breathe out.  Having surgery if other treatments do not work. It is   important to get treatment for sleep apnea. Without treatment, it can lead to:  High blood pressure.  Coronary artery disease.  In men, not being able to have an erection (impotence).  Reduced thinking ability. Follow these instructions at home: Lifestyle  Make changes that your doctor recommends.  Eat a healthy diet.  Lose weight if needed.  Avoid alcohol, medicines to help you relax, and some pain  medicines.  Do not use any products that contain nicotine or tobacco, such as cigarettes, e-cigarettes, and chewing tobacco. If you need help quitting, ask your doctor. General instructions  Take over-the-counter and prescription medicines only as told by your doctor.  If you were given a machine to use while you sleep, use it only as told by your doctor.  If you are having surgery, make sure to tell your doctor you have sleep apnea. You may need to bring your device with you.  Keep all follow-up visits as told by your doctor. This is important. Contact a doctor if:  The machine that you were given to use during sleep bothers you or does not seem to be working.  You do not get better.  You get worse. Get help right away if:  Your chest hurts.  You have trouble breathing in enough air.  You have an uncomfortable feeling in your back, arms, or stomach.  You have trouble talking.  One side of your body feels weak.  A part of your face is hanging down. These symptoms may be an emergency. Do not wait to see if the symptoms will go away. Get medical help right away. Call your local emergency services (911 in the [place] Do not drive yourself to the hospital. Summary  This condition affects breathing during sleep.  The most common cause is a collapsed or blocked airway.  The goal of treatment is to help you breathe normally while you sleep. This information is not intended to replace advice given to you by your health care provider. Make sure you discuss any questions you have with your health care provider. Document Revised: 12/29/2017 Document Reviewed: 11/07/2017 [employer] Patient Education  2020 [employer]  

## 2019-08-13 NOTE — Progress Notes (Signed)
PATIENT: Brandon Mendoza DOB: [birth year]  REASON FOR VISIT: follow up HISTORY FROM: patient  Chief Complaint  Patient presents with  . Follow-up    cpap fu, rm 2, pt is doing well with cpap fu      HISTORY OF PRESENT ILLNESS: Today 08/13/19 Brandon Mendoza is a 79 y.o. male here today for follow up of OSA on CPAP therapy.  He reports he is doing very well.  He denies any concerns with his CPAP machine or supplies.  He is using CPAP nearly every night.  Occasionally he will miss a night if he goes hunting.  He definitely feels that CPAP has helped with improved sleep quality and increased energy levels.  He continues to play racquetball.  He was recently seen by primary care.  He did report some dizziness and feeling off balance for the past few months.  He does note that this seems to be worse when he is playing racquetball.  He is also noticed feeling off balance when walking in the woods.  He denies any chest pain, trouble breathing or palpitations.  He does not have a history of A. fib that he is aware of.  He is on Plavix status post CVA in 2013.  Compliance report dated 07/12/2019 through 08/10/2019 reveals that he used CPAP 28 of the past 30 days for compliance of 93%.  He used CPAP greater than 4 hours 28 of the past 30 days for compliance of 93%.  Average usage was 5 hours and 57 minutes.  Residual AHI was 1.6 on 5 to 10 cm of water and an EPR of 2.  There was no significant leak noted.  HISTORY: (copied from my note on 06/21/2018)  Brandon Mendoza is a 79 y.o. male for follow up of OSA on CPAP.  Mr. reports that he is doing well with his new CPAP machine.  He denies any concerns.  He is in need of new supplies.  Compliance report data as follows:  05/22/2018 - 06/20/2018  Compliance Report Usage 05/22/2018 - 06/20/2018 Usage days 30/30 days (100%) >= 4 hours 30 days (100%) < 4 hours 0 days (0%) Usage hours 173 hours 29 minutes Average usage (total days) 5 hours 47  minutes Average usage (days used) 5 hours 47 minutes Median usage (days used) 5 hours 33 minutes Total used hours (value since last reset - 06/20/2018) 177 hours AirSense 10 AutoSet Serial number [id number] Mode AutoSet Min Pressure 5 cmH2O Max Pressure 10 cmH2O EPR Fulltime EPR level 2 Response Standard Therapy Pressure - cmH2O Median: 7.8 95th percentile: 9.7 Maximum: 9.9 Leaks - L/min Median: 1.0 95th percentile: 9.3 Maximum: 16.1 Events per hour AI: 1.4 HI: 0.2 AHI: 1.6 Apnea Index Central: 0.7 Obstructive: 0.6 Unknown: 0.0 RERA Index 0.1 Cheyne-Stokes respiration (average duration per night) 0 minutes (0%)   History (copied from Dr Dohmeier's note on 03/22/2018)  TMH:DQQI H Gerberis a 79 y.o.male, seen on RV on 03-22-2018 for CPAP compliance but did not bring his CPAP nor the memory chip. His machine is now 80 years old, and he is entitled to a new one, but feels his old one is still working well. He has 3 nocturias and still snores, and he wants to watch TV in his bedroom- his wife has moved to another room. He has several Martinis each night and takes Xanax (!). Mr. RODDRICK SHARRON is a meanwhile 79 year old Caucasian right-handed gentleman with a history of stroke and aftercare through Dr.Sethiand  later through Dr. Erlinda Hong. He has been on his CPAP for about 6 years now. I do not have the compliance data available but he will bring them by today or tomorrow. A discussion is if he needs a new machine he is entitled to 1, I would like for him to have the best possible and interrupted apnea care and is my experience that the machines are set with a software that makes him obsolete after 6 to 7 years anyway. He will undergo HST but is not willing to undergo PSG. He understands this will likely lead to a autotitration CPAP, nasal pillows.   He wasseen here as in a referral from Dr. Erlinda Hong in 2017. He is an establsished stroke patient , who had undergone a sleep study in 2013 and  has been using his CPAP - Compliantly since. He had no follow up in the sleep clinic in the last 3 years and hasnorecollection of seeing me before. I have a note here from my last visit with Mr. Bukhari from 14 May 2012 the patient was at the time followed by Dr. 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 Carotid Doppler showed a left ICA stenosis 60-80%, he was briefly hospitalized with very high systolic blood pressures but did well afterwards he was tolerating aspirin and Plavix. I was able to follow up with a sleep study on 08 March 2012 and the patient was tested positive for sleep apnea, his apnea was strictly obstructive in origin but to a high degree his AHI was 59.1, REM AHI was under 2.1, he slept all night supine. Nadir of oxygen saturation was at 86% with 19 minutes of desaturation time totally. No PLMs.  He was titrated to CPAP at a very low pressure of only 6 cmH2O which alleviated his apnea completely. He has continued to use his CPAP compliantly ever since and recently tried to get new supplies but he was told that he has to follow up with me first. Due to the new Medicare requirements he will now be seen once a year. I was able to get a compliance report on his machine which is Wi-Fi compatible. He is 100% compliant for the last 30 days with 83 compliance 4 hours. His average use of time is 5 hours and 1 minute at night. He feels that the CPAP helps him sleep, he still has 2 times nocturia each night and often does not put the mask back on after his bathroom break therefore the reduced number of hours. He sleeps at least 7 hours nightly. His residual AHI is 2.6 on a setting of 6 cm water pressure with 2 cm EPR and the residual apneas are obstructive in nature. I would not see any reason to change him from  his current machine or setting he has very few air leaks. He endorsed a low degree of fatigue at 9 point and the Epworth sleepiness score at only 5 points, the geriatric depression score was endorsed at 2 out of 15 points. Basically I will have to certify today that the patient is compliant and that he can get new supplies. The patient is insured through [employer].  Sleep habits are as follows: Patient sleeps 7 hours on average at night, 2 nocturias. He sleeps in a differenet bedroom form his wife, watches TV in Bed, but wears  his CPAP after 10 PM, and drifts to sleep when the TV is still on.  He rises at 5.30 AM and goes to gym 6 mornings a week, and on other days goes hunting.   Sleep medical history and family sleep history: daughter has OSA on CPAP, lives in [place]. Father had OSA , untreated.   Social history: married, adult children.    REVIEW OF SYSTEMS: Out of a complete 14 system review of symptoms, the patient complains only of the following symptoms, dizziness, imbalance and all other reviewed systems are negative.  ESS: 4 FSS: 18  ALLERGIES: No Known Allergies  HOME MEDICATIONS: Outpatient Medications Prior to Visit  Medication Sig Dispense Refill  . ALPRAZolam (XANAX) 0.5 MG tablet Take 0.5 mg by mouth at bedtime as needed. For anxiety    . Ascorbic Acid (VITAMIN C PO) Take 2 tablets by mouth daily.    . clopidogrel (PLAVIX) 75 MG tablet Take 75 mg by mouth daily with breakfast.    . lisinopril (PRINIVIL,ZESTRIL) 10 MG tablet Take 10 mg by mouth.    . Misc Natural Products (GLUCOSAMINE CHONDROITIN ADV PO) Take 1 tablet by mouth daily.    . Multiple Vitamins-Minerals (MULTIVITAMINS THER. W/MINERALS) TABS Take 1 tablet by mouth daily.    . Omega-3 Fatty Acids (FISH OIL PO) Take 1 capsule by mouth daily.    . Rosuvastatin Calcium (CRESTOR PO) Take by mouth daily.    . sertraline (ZOLOFT) 50 MG tablet TAKE ONE TABLET BY MOUTH ONCE DAILY 30 tablet 0  . simvastatin  (ZOCOR) 20 MG tablet Take 20 mg by mouth every evening.      No facility-administered medications prior to visit.    PAST MEDICAL HISTORY: Past Medical History:  Diagnosis Date  . Anxiety   . Carotid artery occlusion   . COPD (chronic obstructive pulmonary disease) (HCC)   . Depression   . Hx of colonic polyps   . Hyperlipidemia   . Hypertension   . Nocturia   . Peripheral vascular disease (HCC)   . Sleep apnea    on 6 cm, nasal pillow  . Stroke (HCC) 05/01/2011   ischemic    PAST SURGICAL HISTORY: Past Surgical History:  Procedure Laterality Date  . CAROTID ENDARTERECTOMY  12/09/11   Left cea  . ENDARTERECTOMY  12/09/2011   Procedure: ENDARTERECTOMY CAROTID;  Surgeon: Larina Earthlyodd F Early, MD;  [facility];  Service: Vascular;  Laterality: Left;  . EYE SURGERY     rt retina detachment  . HAMMER TOE SURGERY  2004  . HERNIA REPAIR  2006  . SHOULDER ARTHROSCOPY W/ ROTATOR CUFF REPAIR  2010  . TONSILLECTOMY      FAMILY HISTORY: Family History  Problem Relation Age of Onset  . Alzheimer's disease Father   . Diabetes Other     SOCIAL HISTORY: Social History   Socioeconomic History  . Marital status: Married    Spouse name: [name]  . Number of children: 4  . Years of education: College  . Highest education level: Not on file  Occupational History  . Occupation: Retired  Tobacco Use  . Smoking status: Former Smoker    Packs/day: 1.00    Years: 50.00    Pack years: 50.00    Types: Cigarettes    Quit date: 08/26/2004    Years since quitting: 14.9  . Smokeless tobacco: Never Used  . Tobacco comment: quit smoking 08/2004  Substance and Sexual Activity  . Alcohol use: Yes  Alcohol/week: 14.0 standard drinks    Types: 14 Shots of liquor per week    Comment: 2-3 daily cocktails  . Drug use: No  . Sexual activity: Not on file  Other Topics Concern  . Not on file  Social History Narrative   Patient is married with 4 children.   Patient is right handed.   Patient  has college education.   Patient drinks 3 cups daily.   Social Determinants of Health   Financial Resource Strain:   . Difficulty of Paying Living Expenses:   Food Insecurity:   . Worried About Programme researcher, broadcasting/film/video in the Last Year:   . Barista in the Last Year:   Transportation Needs:   . Freight forwarder (Medical):   Marland Kitchen Lack of Transportation (Non-Medical):   Physical Activity:   . Days of Exercise per Week:   . Minutes of Exercise per Session:   Stress:   . Feeling of Stress :   Social Connections:   . Frequency of Communication with Friends and Family:   . Frequency of Social Gatherings with Friends and Family:   . Attends Religious Services:   . Active Member of Clubs or Organizations:   . Attends Banker Meetings:   Marland Kitchen Marital Status:   Intimate Partner Violence:   . Fear of Current or Ex-Partner:   . Emotionally Abused:   Marland Kitchen Physically Abused:   . Sexually Abused:       PHYSICAL EXAM  Vitals:   08/13/19 0953  BP: (!) 147/88  Pulse: (!) 56  Weight: 194 lb (88 kg)  Height: 5\' 8"  (1.727 m)   Body mass index is 29.5 kg/m.  Generalized: Well developed, in no acute distress  Cardiology: irregularly irregular rhythm with auscultation, however, when reexamining about 3-4 minutes afterwards, heart rhythm normal, no murmur noted Respiratory: clear to auscultation bilaterally  Neurological examination  Mentation: Alert oriented to time, place, history taking. Follows all commands speech and language fluent Cranial nerve II-XII: Pupils were equal round reactive to light. Extraocular movements were full, visual field were full  Motor: The motor testing reveals 5 over 5 strength of all 4 extremities. Good symmetric motor tone is noted throughout.  Gait and station: Gait is wide but stable. Tandem gait is unsteady  DIAGNOSTIC DATA (LABS, IMAGING, TESTING) - I reviewed patient records, labs, notes, testing and imaging myself where available.  No  flowsheet data found.   Lab Results  Component Value Date   WBC 8.9 12/10/2011   HGB 14.4 12/10/2011   HCT 40.9 12/10/2011   MCV 88.7 12/10/2011   PLT 197 12/10/2011      Component Value Date/Time   NA 136 12/10/2011 0355   K 4.1 12/10/2011 0355   CL 103 12/10/2011 0355   CO2 24 12/10/2011 0355   GLUCOSE 134 (H) 12/10/2011 0355   BUN 15 12/10/2011 0355   CREATININE 0.86 12/10/2011 0355   CALCIUM 8.7 12/10/2011 0355   PROT 7.3 11/29/2011 1519   ALBUMIN 4.2 11/29/2011 1519   AST 22 11/29/2011 1519   ALT 16 11/29/2011 1519   ALKPHOS 68 11/29/2011 1519   BILITOT 0.4 11/29/2011 1519   GFRNONAA 85 (L) 12/10/2011 0355   GFRAA >90 12/10/2011 0355   Lab Results  Component Value Date   CHOL 148 11/30/2011   HDL 66 11/30/2011   LDLCALC 65 11/30/2011   TRIG 87 11/30/2011   CHOLHDL 2.2 11/30/2011   Lab Results  Component Value  Date   HGBA1C 5.7 (H) 11/30/2011   No results found for: VITAMINB12 No results found for: TSH     ASSESSMENT AND PLAN 79 y.o. year old male  has a past medical history of Anxiety, Carotid artery occlusion, COPD (chronic obstructive pulmonary disease) (HCC), Depression, colonic polyps, Hyperlipidemia, Hypertension, Nocturia, Peripheral vascular disease (HCC), Sleep apnea, and Stroke (HCC) (05/01/2011). here with     ICD-10-CM   1. OSA on CPAP  G47.33 For home use only DME continuous positive airway pressure (CPAP)   Z99.89   2. Irregular heart beat  I49.9     Mr. August Luz is doing very well from a CPAP perspective.  Compliance report reveals excellent compliance.  He was encouraged to continue CPAP nightly for greater than 4 hours each night.  We have discussed my findings of an irregular heart rhythm with initial auscultation.  Following completion of my neurological exam and conversation with the patient, I listen to his heart rate and rhythm again.  At this point it was normal.  Patient has had some concerns of dizziness and feeling off balance, most  notable when playing racquetball or walking in the woods.  He denies any chest pain, trouble breathing or palpitations.  No other strokelike symptoms.  We have discussed the possibility of an irregular heart rhythm causing his symptoms.  Unfortunately, I am unable to perform an EKG in our office.  I have suggested that he contact Dr. Julius Bowels to request that this be performed for evaluation.  I will also send today's note over for review.  Fortunately he is asymptomatic today.  He is taking Plavix 75 mg daily post CVA in 2013.  Vital signs are unremarkable.  We have reviewed signs and symptoms of a stroke.  He was educated on when to seek emergency medical attention.  I have encouraged him to stay active following an evaluation by PCP.  Adequate hydration and well-balanced diet encouraged.  He will follow-up with me in 1 year for CPAP compliance review, sooner if needed he verbalizes understanding and agreement with this plan.   Orders Placed This Encounter  Procedures  . For home use only DME continuous positive airway pressure (CPAP)    Supplies    Order Specific Question:   Length of Need    Answer:   Lifetime    Order Specific Question:   Patient has OSA or probable OSA    Answer:   Yes    Order Specific Question:   Is the patient currently using CPAP in the home    Answer:   Yes    Order Specific Question:   Settings    Answer:   Other see comments    Order Specific Question:   CPAP supplies needed    Answer:   Mask, headgear, cushions, filters, heated tubing and water chamber     No orders of the defined types were placed in this encounter.     I spent 35 minutes with the patient. 50% of this time was spent counseling and educating patient on plan of care and medications.     Shawnie Dapper, FNP-C 08/13/2019, 10:51 AM [facility] [address], [address] [place], [place] [address] [phone]

## 2019-08-13 NOTE — Progress Notes (Signed)
Provider notes have been faxed to [facility]- Internal [facility] with Dr Julius Bowels. Dr Raechel Ache June 2021.

## 2019-08-13 NOTE — Progress Notes (Signed)
Order for cpap supplies sent to [facility] via [employer]. Confirmation received that the order transmitted was successful.

## 2019-09-16 ENCOUNTER — Telehealth: Payer: Self-pay

## 2019-09-16 NOTE — Telephone Encounter (Signed)
Pt called office and left a VM asking about his MRI brain results ordered by his PCP. PCP was supposed to have faxed results to [place] and advised pt to discuss MRI results. Please call pt at [phone].

## 2019-09-16 NOTE — Telephone Encounter (Signed)
I called pt and relayed that MRI results received.  He wanted to have you review and let him know what you thought.  I relayed that ordering provider gives initials.  He understood this.  Report on AL/NP desk.

## 2019-09-17 NOTE — Telephone Encounter (Signed)
I have reviewed the notes from radiology regarding his MRI. I do not see any new or acute concerns from a stroke perspective. We do need to have him continue stroke prevention management with PCP. I have only seen him for OSA. I will ask Dr Vickey Huger to take a look. We will reach out if any updates from her. TY.

## 2019-09-18 NOTE — Telephone Encounter (Signed)
I called pt and relayed per MRI results ordered by pcp  that from stroke perspective did not see andy new or acute concerns.  Please continue stroke prevention management with pcp,  Continue cpap for osa.  He verbalized understanding.

## 2019-10-07 DIAGNOSIS — R296 Repeated falls: Secondary | ICD-10-CM | POA: Insufficient documentation

## 2019-10-07 DIAGNOSIS — W19XXXA Unspecified fall, initial encounter: Secondary | ICD-10-CM | POA: Insufficient documentation

## 2019-10-14 DIAGNOSIS — M79671 Pain in right foot: Secondary | ICD-10-CM | POA: Insufficient documentation

## 2019-10-21 ENCOUNTER — Other Ambulatory Visit: Payer: Self-pay

## 2019-10-21 ENCOUNTER — Ambulatory Visit: Payer: Medicare Other | Attending: Internal Medicine | Admitting: Physical Therapy

## 2019-10-21 VITALS — HR 54

## 2019-10-21 DIAGNOSIS — R262 Difficulty in walking, not elsewhere classified: Secondary | ICD-10-CM | POA: Insufficient documentation

## 2019-10-21 DIAGNOSIS — R296 Repeated falls: Secondary | ICD-10-CM | POA: Diagnosis present

## 2019-10-21 DIAGNOSIS — M6281 Muscle weakness (generalized): Secondary | ICD-10-CM | POA: Diagnosis present

## 2019-10-21 DIAGNOSIS — R2681 Unsteadiness on feet: Secondary | ICD-10-CM | POA: Insufficient documentation

## 2019-10-21 NOTE — Therapy (Signed)
[facility] [address] [place], [place], [address] Phone: [phone]   Fax:  [phone]  Physical Therapy Evaluation  Patient Details  Name: Brandon Mendoza MRN: [id number] Date of Birth: 07-31-1940 Referring Provider (PT): Harold Barban, MD   Encounter Date: 10/21/2019   PT End of Session - 10/21/19 1656    Visit Number 1    Number of Visits 13    Date for PT Re-Evaluation 12/20/19    Authorization Type Medicare Part A and B/AETNA supplemental    Progress Note Due on Visit 10    PT Start Time 1445    PT Stop Time 1530    PT Time Calculation (min) 45 min    Activity Tolerance Patient tolerated treatment well    Behavior During Therapy [facility] for tasks assessed/performed           Past Medical History:  Diagnosis Date  . Anxiety   . Carotid artery occlusion   . COPD (chronic obstructive pulmonary disease) (HCC)   . Depression   . Hx of colonic polyps   . Hyperlipidemia   . Hypertension   . Nocturia   . Peripheral vascular disease (HCC)   . Sleep apnea    on 6 cm, nasal pillow  . 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 CAROTID ENDARTERECTOMY  12/09/11   Left cea  . ENDARTERECTOMY  12/09/2011   Procedure: ENDARTERECTOMY CAROTID;  Surgeon: Larina Earthly, MD;  Location: [facility] OR;  Service: Vascular;  Laterality: Left;  . EYE SURGERY     rt retina detachment  . HAMMER TOE SURGERY  2004  . HERNIA REPAIR  2006  . SHOULDER ARTHROSCOPY W/ ROTATOR CUFF REPAIR  2010  . TONSILLECTOMY      Vitals:   10/21/19 1524  Pulse: 54  Pulse was regular, irregular    Subjective Assessment - 10/21/19 1449    Subjective No longer getting PT for his hip.  "Dizziness" might not be the right word, more like feeling "off balance".  Has had multiple falls due to imbalance.  Only happens when he is up walking around; doesn't happen when he is sitting or driving.  Pt is very  active and first noticed feeling more unsteady when playing raquetball about 6 months ago.    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 8-10    Has the patient had a decrease in activity level because of a fear of falling?  No    Is the patient reluctant to leave their home because of a fear of falling?  No      Home [employer] information [employer] manager residence    Living Arrangements Spouse/significant other    Type of Home House      Prior Function   Level of Independence Independent    Vocation Retired    Leisure hunting, fishing, yard work, Merchandiser, retail; still driving long distances      Observation/Other Assessments   Focus on Therapeutic Outcomes (FOTO)  Not assessed; not true vertigo      Sensation   Light Touch Appears Intact    Additional Comments has a diagnosis of PN      Coordination   Gross Motor Movements are Fluid and Coordinated Yes    Heel Shin Test WFL      ROM / Strength   AROM / PROM / Strength Strength       Strength   Overall Strength Deficits    Overall Strength Comments 5/5 overall except ankle DF 4/5 bilaterally; able to do 30 standing heel raises but only partial lift and 2 episodes of LOB      Ambulation/Gait   Ambulation/Gait Yes    Ambulation/Gait Assistance 6: Modified independent (Device/Increase time)    Ambulation Distance (Feet) 115 Feet    Assistive device None    Gait Pattern Step-through pattern;Decreased step length - right;Decreased step length - left;Decreased dorsiflexion - right;Decreased dorsiflexion - left    Ambulation Surface Level;Indoor      Standardized Balance Assessment   Standardized Balance Assessment Berg Balance Test      Berg Balance Test   Sit to Stand Able to stand without using hands and stabilize independently    Standing Unsupported Able to stand safely 2 minutes    Sitting with Back Unsupported but Feet Supported on Floor or Stool Able to sit safely and securely 2 minutes    Stand to Sit Sits safely with minimal use of hands    Transfers Able to transfer safely, minor use of hands    Standing Unsupported with Eyes Closed Able to stand 10 seconds safely    Standing Unsupported with Feet Together Able to place feet together independently and stand 1 minute safely    From Standing, Reach Forward with Outstretched Arm Can reach forward >12 cm safely (5")    From Standing Position, Pick up Object from Floor Able to pick up shoe safely and easily    From Standing Position, Turn to Look Behind Over each Shoulder Looks behind from both sides and weight shifts well    Turn 360 Degrees Able to turn 360 degrees safely in 4 seconds or less    Standing Unsupported, Alternately Place Feet on Step/Stool Able to stand independently and safely and complete 8 steps in 20 seconds    Standing Unsupported, One Foot in Front Able to take small step independently and hold 30 seconds    Standing on One Leg Tries to lift leg/unable to hold 3 seconds but remains standing  independently    Total Score 50    Berg comment: 50/56                  Vestibular Assessment - 10/21/19 1505      Balancemaster   Balancemaster Comment M-CTSIB - condition 1 and 2: 30  seconds with mild sway; condition 3: 30 seconds moderate sway; condition 4: 4 seconds              Objective measurements completed on examination: See above findings.               PT Education - 10/21/19 1524    Education Details Clinical findings, PT POC and goals    Person(s) Educated Patient    Methods Explanation    Comprehension Verbalized understanding            PT Short Term Goals - 10/21/19 1715      PT SHORT TERM GOAL #1   Title Pt will participate in further assessment of balance during gait with FGA    Time 4    Period Weeks    Status New    Target Date 11/20/19      PT SHORT TERM GOAL #2   Title Pt will improve BERG balance by 4 points to indicate lower falls risk    Baseline 50/56    Time 4    Period Weeks    Status New    Target Date 11/20/19      PT SHORT TERM GOAL #3   Title Pt will demonstrate independence with initial HEP    Time 4    Period Weeks    Status New    Target Date 11/20/19             PT Long Term Goals - 10/21/19 1717      PT LONG TERM GOAL #1   Title Pt will demonstrate independence with final HEP for balance    Time 8    Period Weeks    Status New    Target Date 12/20/19      PT LONG TERM GOAL #2   Title Pt will increase FGA score by 4 points to indicate decreased falls risk when ambulating in the community    Baseline TBD    Time 8    Period Weeks    Status New    Target Date 12/20/19      PT LONG TERM GOAL #3   Title Pt will demonstrate ability to hold condition 4 on M-CTSIB for 10-15 seconds to indicate improved sensory integration for postural control    Baseline 4 seconds on condition 4    Time 8    Period Weeks    Status New    Target Date 12/20/19      PT LONG TERM GOAL #4   Title Pt will  demonstrate ability to ambulate 1000' outside on uneven terrain and up/down inclines/hills MOD I without LOB    Time 8    Period Weeks    Status New    Target Date 12/20/19                  Plan - 10/21/19 1657    Clinical Impression Statement Patient is an 79 year old male referred to outpatient neurorehabilitation for assessment of dizziness which patient reports is not true vertigo but is unsteadiness/loss of balance.  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  The following deficits were noted during the PT evaluation: disequilibrium, impaired ankle strength, impaired sensory integration for postural control and balance  reactions as indicated by M-CTSIB, impaired standing balance and difficulty walking.  Pt's BERG score indicates pt is at moderate risk for falling.  Pt would benefit from skilled PT services to address these impairments and functional limitations to maximize functional mobility independence and reduce falls risk.    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, BPH with LUTS, HLD, PN; had cataract surgery 3 years ago    Examination-Activity Limitations Locomotion Level;Stairs;Bend    [employer] Activity;Yard Work;Other   hunting and fishing   Stability/Clinical Decision Making Evolving/Moderate complexity    Clinical Decision Making Moderate    Rehab Potential Good    PT Frequency Other (comment)   2x/week  x 4; 1x/week x 4   PT Duration 8 weeks    PT Treatment/Interventions ADLs/Self Care Home Management;Aquatic Therapy;DME Instruction;Gait training;Stair training;Functional mobility training;Therapeutic activities;Therapeutic exercise;Balance training;Neuromuscular re-education;Patient/family education    PT Next Visit Plan assess FGA; initiate HEP focusing on ankle strength/stability, balance on compliant surfaces, smaller BOS, reaching forwards and with decreased vision    PT Home Exercise Plan Exercises to perform on his home 60 UP balance system ([email or web address]    Consulted and Agree with Plan of Care Patient           Patient will benefit from skilled therapeutic intervention in order to improve the following deficits and impairments:  Decreased balance, Decreased strength, Difficulty walking  Visit Diagnosis: Repeated falls  Unsteadiness on feet  Difficulty in walking, not elsewhere classified  Muscle weakness (generalized)     Problem List Patient Active Problem List   Diagnosis Date Noted  . OSA on CPAP 06/20/2018  . Anxiety 04/10/2014  . 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 History of carotid endarterectomy 04/10/2014  . Essential hypertension 04/10/2014  . Aftercare following surgery of the circulatory system, NEC 03/13/2013  . Numbness- Left neck 03/13/2013  . Occlusion and stenosis of carotid artery without mention of cerebral infarction 12/15/2011  . HTN (hypertension), malignant 12/01/2011  . Habitual alcohol use 12/01/2011  . Carotid artery stenosis, symptomatic 11/30/2011  . CVA (cerebral infarction) 11/29/2011  . Hyperlipidemia     Dierdre Highman, PT, DPT 10/21/19    5:23 PM    [facility] [address] [place], [place], [address] Phone: [phone]   Fax:  [phone]  Name: WATSON ROBARGE MRN: [id number] Date of Birth: 1941-01-07

## 2019-10-22 DIAGNOSIS — I480 Paroxysmal atrial fibrillation: Secondary | ICD-10-CM | POA: Insufficient documentation

## 2019-11-06 ENCOUNTER — Other Ambulatory Visit: Payer: Self-pay

## 2019-11-06 ENCOUNTER — Encounter: Payer: Self-pay | Admitting: Physical Therapy

## 2019-11-06 ENCOUNTER — Ambulatory Visit: Payer: Medicare Other | Attending: Internal Medicine | Admitting: Physical Therapy

## 2019-11-06 DIAGNOSIS — R2681 Unsteadiness on feet: Secondary | ICD-10-CM | POA: Diagnosis present

## 2019-11-06 DIAGNOSIS — R262 Difficulty in walking, not elsewhere classified: Secondary | ICD-10-CM

## 2019-11-06 DIAGNOSIS — R2689 Other abnormalities of gait and mobility: Secondary | ICD-10-CM | POA: Diagnosis present

## 2019-11-06 DIAGNOSIS — R296 Repeated falls: Secondary | ICD-10-CM | POA: Diagnosis present

## 2019-11-06 DIAGNOSIS — M6281 Muscle weakness (generalized): Secondary | ICD-10-CM | POA: Insufficient documentation

## 2019-11-06 NOTE — Patient Instructions (Signed)
Access Code: [facility] URL: [email or web address] Date: 11/06/2019 Prepared by: Bufford Lope  Exercises Standing Bilateral Gastroc Stretch with Step - 1 x daily - 7 x weekly - 2 sets - 30 seconds hold Heel Toe Raises with Unilateral Counter Support - 1 x daily - 5 x weekly - 1 sets - 15 reps Walking Tandem Stance - 1 x daily - 5 x weekly - 2 sets Toe Walking with Counter Support - 1 x daily - 5 x weekly - 2 sets Heel Walking with Counter Support - 1 x daily - 5 x weekly - 2 sets Squat with Chair Touch - 1 x daily - 7 x weekly - 2 sets - 10 reps Standing with Eyes Closed - 1 x daily - 7 x weekly - 2 sets - 10 reps

## 2019-11-06 NOTE — Therapy (Signed)
[facility] [address] [place], [place], [address] Phone: [phone]   Fax:  [phone]  Physical Therapy Treatment  Patient Details  Name: Brandon Mendoza MRN: [id number] Date of Birth: 1940-05-18 Referring Provider (PT): Harold Barban, MD   Encounter Date: 11/06/2019   PT End of Session - 11/06/19 1316    Visit Number 2    Number of Visits 13    Date for PT Re-Evaluation 12/20/19    Authorization Type Medicare Part A and B/AETNA supplemental    Progress Note Due on Visit 10    PT Start Time 1233    PT Stop Time 1316    PT Time Calculation (min) 43 min    Activity Tolerance Patient tolerated treatment well    Behavior During Therapy [facility] for tasks assessed/performed           Past Medical History:  Diagnosis Date  . Anxiety   . Carotid artery occlusion   . COPD (chronic obstructive pulmonary disease) (HCC)   . Depression   . Hx of colonic polyps   . Hyperlipidemia   . Hypertension   . Nocturia   . Peripheral vascular disease (HCC)   . Sleep apnea    on 6 cm, nasal pillow  . 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 CAROTID ENDARTERECTOMY  12/09/11   Left cea  . ENDARTERECTOMY  12/09/2011   Procedure: ENDARTERECTOMY CAROTID;  Surgeon: Larina Earthly, MD;  Location: [facility] OR;  Service: Vascular;  Laterality: Left;  . EYE SURGERY     rt retina detachment  . HAMMER TOE SURGERY  2004  . HERNIA REPAIR  2006  . SHOULDER ARTHROSCOPY W/ ROTATOR CUFF REPAIR  2010  . TONSILLECTOMY      There were no vitals filed for this visit.   Subjective Assessment - 11/06/19 1234    Subjective Two days after eval pt was sweeping up trash outside and when he bent over to sweep it up he lost his balance, fell forwards on to knees and then forward bumping his head on the cement.  Skin tears on both knees.  Thinks he lost his balance due to the heat and fatigue.     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 No/denies              [facility] PT Assessment - 11/06/19 1237      Functional Gait  Assessment   Gait assessed  Yes    Gait Level Surface Walks 20 ft in less than 5.5 sec, no assistive devices, good speed, no evidence for imbalance, normal gait pattern, deviates no more than 6 in outside of the 12 in walkway width.    Change in Gait Speed Able to smoothly change walking speed without loss of balance or gait deviation. Deviate no more than 6 in outside of the 12 in walkway width.    Gait with Horizontal Head Turns Performs head turns smoothly with no change in gait. Deviates no more than 6 in outside 12 in walkway width    Gait with Vertical Head Turns Performs head turns with no change in gait. Deviates no more than 6  in outside 12 in walkway width.    Gait and Pivot Turn Pivot turns safely within 3 sec and stops quickly with no loss of balance.    Step Over Obstacle Is able to step over 2 stacked shoe boxes taped together (9 in total height) without changing gait speed. No evidence of imbalance.    Gait with Narrow Base of Support Ambulates less than 4 steps heel to toe or cannot perform without assistance.    Gait with Eyes Closed Walks 20 ft, no assistive devices, good speed, no evidence of imbalance, normal gait pattern, deviates no more than 6 in outside 12 in walkway width. Ambulates 20 ft in less than 7 sec.    Ambulating Backwards Walks 20 ft, uses assistive device, slower speed, mild gait deviations, deviates 6-10 in outside 12 in walkway width.    Steps Alternating feet, no rail.    Total Score 26    FGA comment: 26/30               [facility] Adult PT  Treatment/Exercise - 11/06/19 1552      Exercises   Exercises Other Exercises    Other Exercises  Initiated HEP focusing on ankle strength and balance reactions.  Performed the following exercises with patient return demonstrating all exercises.  Provided cues for safe set up to prevent fall (having counter, wall, chair behind patient).            Access Code: [facility] URL: [email or web address] Date: 11/06/2019 Prepared by: Bufford Lope  Exercises Standing Bilateral Gastroc Stretch with Step - 1 x daily - 7 x weekly - 2 sets - 30 seconds hold Heel Toe Raises with Unilateral Counter Support - 1 x daily - 5 x weekly - 1 sets - 15 reps Walking Tandem Stance - 1 x daily - 5 x weekly - 2 sets Toe Walking with Counter Support - 1 x daily - 5 x weekly - 2 sets Heel Walking with Counter Support - 1 x daily - 5 x weekly - 2 sets Squat with Chair Touch - 1 x daily - 7 x weekly - 2 sets - 10 reps Standing with Eyes Closed - 1 x daily - 7 x weekly - 2 sets - 10 reps        PT Education - 11/06/19 1316    Education Details FGA findings, initial HEP    Person(s) Educated Patient    Methods Explanation;Demonstration;Handout    Comprehension Verbalized understanding;Returned demonstration            PT Short Term Goals - 10/21/19 1715      PT SHORT TERM GOAL #1   Title Pt will participate in further assessment of balance during gait with FGA    Time 4    Period Weeks    Status New    Target Date 11/20/19      PT SHORT TERM GOAL #2   Title Pt will improve BERG balance by 4 points to indicate lower falls risk    Baseline 50/56    Time 4    Period Weeks    Status New    Target Date 11/20/19      PT SHORT TERM GOAL #3   Title Pt will demonstrate independence with initial HEP    Time 4    Period Weeks    Status New    Target Date 11/20/19             PT Long Term Goals - 11/06/19  1606      PT LONG TERM GOAL #1   Title Pt will demonstrate independence  with final HEP for balance  (LTG due by 12/20/19)    Time 8    Period Weeks    Status New      PT LONG TERM GOAL #2   Title Pt will increase FGA score by 4 points to indicate decreased falls risk when ambulating in the community    Baseline 26/30    Time 8    Period Weeks    Status New      PT LONG TERM GOAL #3   Title Pt will demonstrate ability to hold condition 4 on M-CTSIB for 10-15 seconds to indicate improved sensory integration for postural control    Baseline 4 seconds on condition 4    Time 8    Period Weeks    Status New      PT LONG TERM GOAL #4   Title Pt will demonstrate ability to ambulate 1000' outside on uneven terrain and up/down inclines/hills MOD I without LOB    Time 8    Period Weeks    Status New                 Plan - 11/06/19 1601    Clinical Impression Statement Patient returns after initial evaluation reporting one fall.  LOB forwards when reaching down to the ground.  Continued assessment of falls risk with FGA with pt demonstrating low falls risk.  Initiated HEP focusing on ankle strengthening and balance reactions providing cues for safe technique and safe set up.    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, BPH with LUTS, HLD, PN; had cataract surgery 3 years ago    Examination-Activity Limitations Locomotion Level;Stairs;Bend    [employer] Activity;Yard Work;Other   hunting and fishing   Stability/Clinical Decision Making Evolving/Moderate complexity    Rehab Potential Good    PT Frequency Other (comment)   2x/week x 4; 1x/week x 4   PT Duration 8 weeks    PT Treatment/Interventions ADLs/Self Care Home Management;Aquatic Therapy;DME  Instruction;Gait training;Stair training;Functional mobility training;Therapeutic activities;Therapeutic exercise;Balance training;Neuromuscular re-education;Patient/family education    PT Next Visit Plan How is HEP going?  Continue to focus on ankle strength/stability, balance on compliant surfaces, smaller BOS, SLS, reaching forwards and to the ground, and with decreased vision    PT Home Exercise Plan Exercises to perform on his home 60 UP balance system ([email or web address]    Consulted and Agree with Plan of Care Patient           Patient will benefit from skilled therapeutic intervention in order to improve the following deficits and impairments:  Decreased balance, Decreased strength, Difficulty walking  Visit Diagnosis: Repeated falls  Unsteadiness on feet  Difficulty in walking, not elsewhere classified     Problem List Patient Active Problem List   Diagnosis Date Noted  . OSA on CPAP 06/20/2018  . Anxiety 04/10/2014  . 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 History of carotid endarterectomy 04/10/2014  . Essential hypertension 04/10/2014  . Aftercare following surgery of the circulatory system, NEC 03/13/2013  . Numbness- Left neck 03/13/2013  . Occlusion and stenosis of carotid artery without mention of cerebral infarction  12/15/2011  . HTN (hypertension), malignant 12/01/2011  . Habitual alcohol use 12/01/2011  . Carotid artery stenosis, symptomatic 11/30/2011  . CVA (cerebral infarction) 11/29/2011  . Hyperlipidemia     Dierdre HighmanAudra F Nimai Burbach, PT, DPT 11/06/19    4:07 PM    [facility] [address] [place], [place], [address] Phone: [phone]   Fax:  [phone]  Name: Brandon Mendoza MRN: [id number] Date of Birth: October 03, 1940

## 2019-11-15 ENCOUNTER — Other Ambulatory Visit: Payer: Self-pay

## 2019-11-15 ENCOUNTER — Ambulatory Visit: Payer: Medicare Other | Admitting: Physical Therapy

## 2019-11-15 ENCOUNTER — Encounter: Payer: Self-pay | Admitting: Physical Therapy

## 2019-11-15 DIAGNOSIS — M6281 Muscle weakness (generalized): Secondary | ICD-10-CM

## 2019-11-15 DIAGNOSIS — R296 Repeated falls: Secondary | ICD-10-CM | POA: Diagnosis not present

## 2019-11-15 DIAGNOSIS — R2681 Unsteadiness on feet: Secondary | ICD-10-CM

## 2019-11-15 DIAGNOSIS — R262 Difficulty in walking, not elsewhere classified: Secondary | ICD-10-CM

## 2019-11-15 NOTE — Therapy (Signed)
[facility] [address] [place], [place], [address] Phone: [phone]   Fax:  [phone]  Physical Therapy Treatment  Patient Details  Name: Brandon Mendoza MRN: [id number] Date of Birth: 1940-07-16 Referring Provider (PT): Harold Barban, MD   Encounter Date: 11/15/2019   PT End of Session - 11/15/19 1321    Visit Number 3    Number of Visits 13    Date for PT Re-Evaluation 12/20/19    Authorization Type Medicare Part A and B/AETNA supplemental    Progress Note Due on Visit 10    PT Start Time 1318    PT Stop Time 1400    PT Time Calculation (min) 42 min    Activity Tolerance Patient tolerated treatment well    Behavior During Therapy [facility] for tasks assessed/performed           Past Medical History:  Diagnosis Date  . Anxiety   . Carotid artery occlusion   . COPD (chronic obstructive pulmonary disease) (HCC)   . Depression   . Hx of colonic polyps   . Hyperlipidemia   . Hypertension   . Nocturia   . Peripheral vascular disease (HCC)   . Sleep apnea    on 6 cm, nasal pillow  . 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 CAROTID ENDARTERECTOMY  12/09/11   Left cea  . ENDARTERECTOMY  12/09/2011   Procedure: ENDARTERECTOMY CAROTID;  Surgeon: Larina Earthly, MD;  Location: [facility] OR;  Service: Vascular;  Laterality: Left;  . EYE SURGERY     rt retina detachment  . HAMMER TOE SURGERY  2004  . HERNIA REPAIR  2006  . SHOULDER ARTHROSCOPY W/ ROTATOR CUFF REPAIR  2010  . TONSILLECTOMY      There were no vitals filed for this visit.   Subjective Assessment - 11/15/19 1319    Subjective No new complaints. Played Racket Ball this morning, and noticed this increases his symptoms. Reports it's not a "dizziness", more of an "unsteadiness". HEP is going well.    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 No/denies                 [facility] Adult PT Treatment/Exercise - 11/15/19 1322      Transfers   Transfers Sit to Stand;Stand to Sit    Sit to Stand 7: Independent    Stand to Sit 7: Independent      Ambulation/Gait   Ambulation/Gait Yes    Ambulation/Gait Assistance 6: Modified independent (Device/Increase time);5: Supervision    Assistive device None    Gait Pattern Step-through pattern;Decreased step length - right;Decreased step length - left;Decreased dorsiflexion - right;Decreased dorsiflexion - left    Ambulation Surface Level;Indoor    Gait Comments gait around track with ball- self tossing ball up/catch while tracking the ball with eyes/head with min guard assist, then passing the ball to the right/retrieving the ball on the left with min guard assist, 1 lap each, no increase in symptoms and no significant balance loss noted.       High Level Balance   High Level Balance Activities Marching forwards;Marching backwards;Tandem walking   tandem/toe/heel walking  fwd/bwd   High Level Balance Comments on red/blue mat next to counter top: 3 laps each with intermittent touch to bars, cues on ex form/technique, min guard assist to min assist at times.                Balance Exercises - 11/15/19 0001      Balance Exercises: Standing   SLS with Vectors Foam/compliant surface;Intermittent upper extremity assist;Other reps (comment);Limitations    SLS with Vectors Limitations on balance  board in ant/post direction with 2 tall cones on floor in front of pt: alternating fwd foot taps for 10 reps each side with occasional touch to bars for balance, min guard to min assist with cues on posture/weight shifting for balance assist/recover    Rockerboard  Anterior/posterior;Lateral;EO;EC;30 seconds;Intermittent UE support;Limitations    Rockerboard Limitations performed both ways on balance board in parallel bars with occasional touch to bars for balance assistance: rocking the board with emphasis on tall posture with EO, progressing to EC; then holding the board steady for EC 30 sec's x 3 reps. cues on posture and weight shifting for balance assistance. min guard to min assist for balance.     Balance Beam standing across blue foam beam: alternating fwd heel taps to floor/back onto beam, then alternating bwd toe taps to floor/back onto beam with cues on step length/step height and weight shifting for 10 reps each. no UE support on bars with occasional touch, min guard assist for balance.               PT Short Term Goals - 10/21/19 1715      PT SHORT TERM GOAL #1   Title Pt will participate in further assessment of balance during gait with FGA    Time 4    Period Weeks    Status New    Target Date 11/20/19      PT SHORT TERM GOAL #2   Title Pt will improve BERG balance by 4 points to indicate lower falls risk    Baseline 50/56    Time 4    Period Weeks    Status New    Target Date 11/20/19      PT SHORT TERM GOAL #3   Title Pt will demonstrate independence with initial HEP    Time 4    Period Weeks    Status New    Target Date 11/20/19             PT Long Term Goals - 11/06/19 1606      PT LONG TERM GOAL #1   Title Pt will demonstrate independence with final HEP for balance  (LTG due by 12/20/19)    Time 8    Period Weeks    Status New      PT LONG TERM GOAL #2   Title Pt will increase FGA score by 4 points to indicate decreased falls risk when ambulating in the community    Baseline 26/30    Time 8    Period Weeks    Status New      PT LONG TERM GOAL #3   Title Pt will demonstrate ability to hold condition 4 on M-CTSIB for 10-15 seconds to indicate improved sensory integration for postural control    Baseline  4 seconds on condition 4    Time 8    Period Weeks    Status New      PT LONG TERM GOAL #4   Title Pt will demonstrate  ability to ambulate 1000' outside on uneven terrain and up/down inclines/hills MOD I without LOB    Time 8    Period Weeks    Status New                 Plan - 11/15/19 1321    Clinical Impression Statement Today's skilled session continued to focus on high level balance, ankle strengtening and balance strategies with no issues reported or noted in session. The pt is challenged with compliant surfaces and with vision removed. The pt is progressing toward goals and should benefit from continued PT to progress toward unmet goals.    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, BPH with LUTS, HLD, PN; had cataract surgery 3 years ago    Examination-Activity Limitations Locomotion Level;Stairs;Bend    [employer] Activity;Yard Work;Other   hunting and fishing   Stability/Clinical Decision Making Evolving/Moderate complexity    Rehab Potential Good    PT Frequency Other (comment)   2x/week x 4; 1x/week x 4   PT Duration 8 weeks    PT Treatment/Interventions ADLs/Self Care Home Management;Aquatic Therapy;DME Instruction;Gait training;Stair training;Functional mobility training;Therapeutic activities;Therapeutic exercise;Balance training;Neuromuscular re-education;Patient/family education    PT Next Visit Plan Continue to focus on ankle strength/stability, balance on compliant surfaces, smaller BOS, SLS, reaching forwards and to the ground, and with decreased vision; ? hyperdash to simulate quick Racket ball movements    PT Home Exercise Plan Exercises to perform on his  home 60 UP balance system ([email or web address]    Consulted and Agree with Plan of Care Patient           Patient will benefit from skilled therapeutic intervention in order to improve the following deficits and impairments:  Decreased balance, Decreased strength, Difficulty walking  Visit Diagnosis: Repeated falls  Unsteadiness on feet  Difficulty in walking, not elsewhere classified  Muscle weakness (generalized)     Problem List Patient Active Problem List   Diagnosis Date Noted  . OSA on CPAP 06/20/2018  . Anxiety 04/10/2014  . 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 History of carotid endarterectomy 04/10/2014  . Essential hypertension 04/10/2014  . Aftercare following surgery of the circulatory system, NEC 03/13/2013  . Numbness- Left neck 03/13/2013  . Occlusion and stenosis of carotid artery without mention of cerebral infarction 12/15/2011  . HTN (hypertension), malignant 12/01/2011  . Habitual alcohol use 12/01/2011  . Carotid artery stenosis, symptomatic 11/30/2011  . CVA (cerebral infarction) 11/29/2011  . Hyperlipidemia     Sallyanne Kuster, PTA, [facility] Outpatient Neuro [facility] [address], [address] [place], [place] [address] [phone] 11/15/19, 3:09 PM   Name: Brandon Mendoza MRN: [id number] Date of Birth: 08/25/1940

## 2019-11-17 DIAGNOSIS — R296 Repeated falls: Secondary | ICD-10-CM | POA: Insufficient documentation

## 2019-11-19 ENCOUNTER — Encounter: Payer: Self-pay | Admitting: Physical Therapy

## 2019-11-19 ENCOUNTER — Other Ambulatory Visit: Payer: Self-pay

## 2019-11-19 ENCOUNTER — Ambulatory Visit: Payer: Medicare Other | Admitting: Physical Therapy

## 2019-11-19 DIAGNOSIS — M6281 Muscle weakness (generalized): Secondary | ICD-10-CM

## 2019-11-19 DIAGNOSIS — R2689 Other abnormalities of gait and mobility: Secondary | ICD-10-CM

## 2019-11-19 DIAGNOSIS — R2681 Unsteadiness on feet: Secondary | ICD-10-CM

## 2019-11-19 DIAGNOSIS — R262 Difficulty in walking, not elsewhere classified: Secondary | ICD-10-CM

## 2019-11-19 DIAGNOSIS — R296 Repeated falls: Secondary | ICD-10-CM

## 2019-11-20 NOTE — Therapy (Signed)
[facility] [address] [place], [place], [address] Phone: [phone]   Fax:  [phone]  Physical Therapy Treatment  Patient Details  Name: Brandon Mendoza MRN: [id number] Date of Birth: 01/01/1941 Referring Provider (PT): Basilio Cairo, MD   Encounter Date: 11/19/2019   PT End of Session - 11/19/19 1450    Visit Number 4    Number of Visits 13    Date for PT Re-Evaluation 12/20/19    Authorization Type Medicare Part A and B/AETNA supplemental    Progress Note Due on Visit 10    PT Start Time 8676    PT Stop Time 1530    PT Time Calculation (min) 43 min    Equipment Utilized During Treatment Gait belt    Activity Tolerance Patient tolerated treatment well    Behavior During Therapy WFL for tasks assessed/performed           Past Medical History:  Diagnosis Date  . Anxiety   . Carotid artery occlusion   . COPD (chronic obstructive pulmonary disease) ([place])   . Depression   . Hx of colonic polyps   . Hyperlipidemia   . Hypertension   . Nocturia   . Peripheral vascular disease ([place])   . Sleep apnea    on 6 cm, nasal pillow  . Stroke ([place]) 05/01/2011   ischemic    Past Surgical History:  Procedure Laterality Date  . CAROTID ENDARTERECTOMY  12/09/11   Left cea  . ENDARTERECTOMY  12/09/2011   Procedure: ENDARTERECTOMY CAROTID;  Surgeon: Rosetta Posner, MD;  Location: [facility];  Service: Vascular;  Laterality: Left;  . EYE SURGERY     rt retina detachment  . [place]  2004  . HERNIA REPAIR  2006  . SHOULDER ARTHROSCOPY W/ ROTATOR CUFF REPAIR  2010  . TONSILLECTOMY      There were no vitals filed for this visit.   Subjective Assessment - 11/19/19 1449    Subjective Reports he gets dizzy when he exerts himself, does state it clears up quickly. No falls. Saw the cardiogist yesterday. To start Elliquis for A-fb when it arrives from the mail in pharmacy Also to start taking Flecainide (Tambocor).  Taking him off the Metoprolol now and Plavix when he starts the Elliquis.    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 No/denies              [facility] PT Assessment - 11/19/19 1452      Berg Balance Test   Sit to Stand Able to stand without using hands and stabilize independently    Standing Unsupported Able to stand safely 2 minutes    Sitting with Back Unsupported but Feet Supported on Floor or Stool Able to sit safely and securely 2 minutes    Stand to Sit Sits safely with minimal use of hands    Transfers Able to transfer safely, minor use of hands    Standing Unsupported with Eyes Closed Able to stand 10 seconds safely    Standing Unsupported with Feet Together Able to place feet together independently and stand 1 minute safely    From Standing, Reach Forward with Outstretched Arm Can reach confidently >25 cm (10")  From Standing Position, Pick up Object from [place] to pick up shoe safely and easily    From Standing Position, Turn to Look Behind Over each Shoulder Looks behind from both sides and weight shifts well    Turn 360 Degrees Able to turn 360 degrees safely in 4 seconds or less    Standing Unsupported, Alternately Place Feet on Step/Stool Able to stand independently and safely and complete 8 steps in 20 seconds    Standing Unsupported, One Foot in [place] to plae foot ahead of the other independently and hold 30 seconds    Standing on One Leg Tries to lift leg/unable to hold 3 seconds but remains standing independently    Total Score 52    Berg comment: 52/56               [facility] Adult PT Treatment/Exercise - 11/19/19 1451      Self-Care   Self-Care Other Self-Care Comments    Other  Self-Care Comments  discussed HEP. pt does a few of them each day. No issue and reports they are still challenging.       Neuro Re-ed    Neuro Re-ed Details  for balance/muscle re-ed: red mat on floor centered between all mat tables with bean bags scattered underneath and multilpe beams on floor next to mat table- had pt pick up cones from various heights while negotiating over the mat/stepping over beams with min guard assist and no increase in symptoms reported; then has pt engage in game of hyperdash over same set up progressing from level 1 to level 2 with one round of each level played. min guard assist for safety with no symptoms provoked.                Balance Exercises - 11/19/19 1520      Balance Exercises: Standing   Standing Eyes Opened Wide (BOA);Foam/compliant surface;Other reps (comment);Limitations    Standing Eyes Opened Limitations standing across blue beam for head movements left<>right,  up<>down with min guard assist to min assist, no UE support. cues on posture/weight shifting to assist with balance.     Standing Eyes Closed Wide (BOA);Foam/compliant surface;3 reps;30 secs;Limitations    Standing Eyes Closed Limitations standing across blue foam beam with EC with up to min assist needed. cues on posture/weight shifting to assist with balance recovery.                PT Short Term Goals - 11/19/19 1450      PT SHORT TERM GOAL #1   Title Pt will participate in further assessment of balance during gait with FGA    Baseline met with second session    Status Achieved    Target Date --      PT SHORT TERM GOAL #2   Title Pt will improve BERG balance by 4 points to indicate lower falls risk    Baseline 11/19/19: 52/56 scored, improved by 2 points from 50/56    Time --    Period --    Status Partially Met    Target Date --      PT SHORT TERM GOAL #3   Title Pt will demonstrate independence with initial HEP    Baseline 11/19/19: met with current program     Status Achieved    Target Date --             PT Long Term Goals - 11/06/19 1606      PT LONG TERM  GOAL #1   Title Pt will demonstrate independence with final HEP for balance  (LTG due by 12/20/19)    Time 8    Period Weeks    Status New      PT LONG TERM GOAL #2   Title Pt will increase FGA score by 4 points to indicate decreased falls risk when ambulating in the community    Baseline 26/30    Time 8    Period Weeks    Status New      PT LONG TERM GOAL #3   Title Pt will demonstrate ability to hold condition 4 on M-CTSIB for 10-15 seconds to indicate improved sensory integration for postural control    Baseline 4 seconds on condition 4    Time 8    Period Weeks    Status New      PT LONG TERM GOAL #4   Title Pt will demonstrate ability to ambulate 1000' outside on uneven terrain and up/down inclines/hills MOD I without LOB    Time 8    Period Weeks    Status New                 Plan - 11/19/19 1450    Clinical Impression Statement Today's skilled session intiially focused on progress toward STGs wtih goal #1 met on visit number 2 and HEP goal met today. Pt has progressed toward goal #2 Berg balance test goal with a 2 point increase in score, scoring 52/56 today. Remainder of session focused on high level balance with no symptoms provoked in session today. The pt is progressing toward goals and should benefit from continued PT to progress toward unmet goals.    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, BPH with LUTS, HLD, PN; had cataract surgery 3 years ago    Examination-Activity Limitations Locomotion Level;Stairs;Bend    [employer] Activity;Yard Work;Other    hunting and fishing   Stability/Clinical Decision Making Evolving/Moderate complexity    Rehab Potential Good    PT Frequency Other (comment)   2x/week x 4; 1x/week x 4   PT Duration 8 weeks    PT Treatment/Interventions ADLs/Self Care Home Management;Aquatic Therapy;DME Instruction;Gait training;Stair training;Functional mobility training;Therapeutic activities;Therapeutic exercise;Balance training;Neuromuscular re-education;Patient/family education    PT Next Visit Plan Continue to focus on ankle strength/stability, balance on compliant surfaces, smaller BOS, SLS, reaching forwards and to the ground, and with decreased vision; ? hyperdash to simulate quick Racket ball movements    PT Home Exercise Plan Exercises to perform on his home 60 UP balance system ([email or web address])    Consulted and Agree with Plan of Care Patient           Patient will benefit from skilled therapeutic intervention in order to improve the following deficits and impairments:  Decreased balance, Decreased strength, Difficulty walking  Visit Diagnosis: Repeated falls  Unsteadiness on feet  Difficulty in walking, not elsewhere classified  Muscle weakness (generalized)  Other abnormalities of gait and mobility     Problem List Patient Active Problem List   Diagnosis Date Noted  . OSA on CPAP 06/20/2018  . Anxiety 04/10/2014  . 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 History of carotid endarterectomy 04/10/2014  . Essential hypertension 04/10/2014  .  Aftercare following surgery of the circulatory system, [place] 03/13/2013  . Numbness- Left neck 03/13/2013  . Occlusion and stenosis of carotid artery without mention of cerebral infarction 12/15/2011  . HTN (hypertension), malignant 12/01/2011  . Habitual alcohol use 12/01/2011  . Carotid artery stenosis, symptomatic 11/30/2011  . CVA (cerebral infarction) 11/29/2011  . Hyperlipidemia     Willow Ora, PTA,  [facility] [address], [place], [place] [address] [phone] 11/20/19, 11:43 AM   Name: BELAL SCALLON MRN: [id number] Date of Birth: December 01, 1940

## 2019-11-21 ENCOUNTER — Ambulatory Visit: Payer: Medicare Other | Admitting: Physical Therapy

## 2019-11-22 ENCOUNTER — Other Ambulatory Visit: Payer: Self-pay

## 2019-11-22 ENCOUNTER — Ambulatory Visit: Payer: Medicare Other | Admitting: Physical Therapy

## 2019-11-22 DIAGNOSIS — R2689 Other abnormalities of gait and mobility: Secondary | ICD-10-CM

## 2019-11-22 DIAGNOSIS — R262 Difficulty in walking, not elsewhere classified: Secondary | ICD-10-CM

## 2019-11-22 DIAGNOSIS — R296 Repeated falls: Secondary | ICD-10-CM

## 2019-11-22 DIAGNOSIS — R2681 Unsteadiness on feet: Secondary | ICD-10-CM

## 2019-11-22 DIAGNOSIS — M6281 Muscle weakness (generalized): Secondary | ICD-10-CM

## 2019-11-22 NOTE — Therapy (Addendum)
[facility] [address] [place], [place], [address] Phone: [phone]   Fax:  [phone]  Physical Therapy Treatment  Patient Details  Name: MATAS BURROWS MRN: [id number] Date of Birth: 1940/06/19 Referring Provider (PT): Basilio Cairo, MD   Encounter Date: 11/22/2019   PT End of Session - 11/22/19 1613    Visit Number 5    Number of Visits 13    Date for PT Re-Evaluation 12/20/19    Authorization Type Medicare Part A and B/AETNA supplemental    Progress Note Due on Visit 10    PT Start Time 9735    PT Stop Time 1611    PT Time Calculation (min) 41 min    Equipment Utilized During Treatment Gait belt    Activity Tolerance Patient tolerated treatment well    Behavior During Therapy [facility] for tasks assessed/performed           Past Medical History:  Diagnosis Date  . Anxiety   . Carotid artery occlusion   . COPD (chronic obstructive pulmonary disease) ([place])   . Depression   . Hx of colonic polyps   . Hyperlipidemia   . Hypertension   . Nocturia   . Peripheral vascular disease ([place])   . Sleep apnea    on 6 cm, nasal pillow  . Stroke ([place]) 05/01/2011   ischemic    Past Surgical History:  Procedure Laterality Date  . CAROTID ENDARTERECTOMY  12/09/11   Left cea  . ENDARTERECTOMY  12/09/2011   Procedure: ENDARTERECTOMY CAROTID;  Surgeon: Rosetta Posner, MD;  Location: [facility];  Service: Vascular;  Laterality: Left;  . EYE SURGERY     rt retina detachment  . [place]  2004  . HERNIA REPAIR  2006  . SHOULDER ARTHROSCOPY W/ ROTATOR CUFF REPAIR  2010  . TONSILLECTOMY      There were no vitals filed for this visit.   Subjective Assessment - 11/22/19 1532    Subjective No changes since the last time he was here. No falls.    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 No/denies                                  Balance Exercises - 11/22/19 0001      Balance Exercises: Standing   Standing Eyes Closed Foam/compliant surface;Narrow base of support (BOS);3 reps;30 secs;Limitations    Standing Eyes Closed Limitations slightly less than hip width distance    SLS Eyes open;Foam/compliant surface;Limitations    SLS Limitations standing on blue foam beam alternating forward taps to cones x10 reps B, then progressing to forward and then cross body tap to 2nd cone x6 reps B, with min guard/min A for balance    Rockerboard Anterior/posterior;Limitations    Rockerboard Limitations bean bag toss - reaching forwards and superiorly x15 reps, cues for hip and ankle strategy, then performing eyes closed balance in corner without UE support eyes closed multiple reps of approx. 8-10 seconds each - cues for using ankle/hip strategy for balance    Tandem Gait Intermittent upper extremity support;Forward;3 reps;Limitations    Tandem Gait Limitations down and back  x3 reps, cues for slowed and controlled    Other Standing Exercises standing on blue foam beam: tandem stance with LLE then stepping RLE forward and backwards x5 reps and then performing same activity with tandem with RLE and stepping LLE forward and posteriorly    Other Standing Exercises Comments on blue side of BOSU: with B fingertip support, stepping one foot on blue side and contalateral leg marching x10 reps B, min guard/min A for balance                11/24/19 0001  Therapeutic Activites   Therapeutic Activities Other Therapeutic Activities  Other Therapeutic Activities Pt reporting at beginning of session that he would like today to be his last day of PT and would like to cancel all future appointments due to reporting he feels as if  therapy is not helping his balance and has noticed no differences. Therapist having a discussion with pt that this is only his 3rd therapy session and has made improvements in his balance (based on outcome assessments) and that he is still at a moderate risk for falls and therapy would continue to be beneficial to continue to work on different aspects of balance. Discussed with pt importance of compliance with HEP and performing consistently in order to work on balance outside of therapy and for continued improvements. Educated pt on 3 balance systems - vestibular, somatosensory, and vision and role of each in balance and what system is targeted with exercises performed in therapy gym/on pt's HEP. At end of session pt reporting he would like to keep all future PT appointments and continue with therapy.       PT Short Term Goals - 11/19/19 1450      PT SHORT TERM GOAL #1   Title Pt will participate in further assessment of balance during gait with FGA    Baseline met with second session    Status Achieved    Target Date --      PT SHORT TERM GOAL #2   Title Pt will improve BERG balance by 4 points to indicate lower falls risk    Baseline 11/19/19: 52/56 scored, improved by 2 points from 50/56    Time --    Period --    Status Partially Met    Target Date --      PT SHORT TERM GOAL #3   Title Pt will demonstrate independence with initial HEP    Baseline 11/19/19: met with current program    Status Achieved    Target Date --             PT Long Term Goals - 11/06/19 1606      PT LONG TERM GOAL #1   Title Pt will demonstrate independence with final HEP for balance  (LTG due by 12/20/19)    Time 8    Period Weeks    Status New      PT LONG TERM GOAL #2   Title Pt will increase FGA score by 4 points to indicate decreased falls risk when ambulating in the community    Baseline 26/30    Time 8    Period Weeks    Status New      PT LONG TERM GOAL #3   Title Pt will demonstrate  ability to hold condition 4 on M-CTSIB for 10-15 seconds to indicate improved sensory integration for postural control    Baseline 4 seconds on condition 4    Time 8  Period Weeks    Status New      PT LONG TERM GOAL #4   Title Pt will demonstrate ability to ambulate 1000' outside on uneven terrain and up/down inclines/hills MOD I without LOB    Time 8    Period Weeks    Status New                11/24/19 2204  Plan  Clinical Impression Statement Pt stating at beginning of session that he would want today to be pt's last therapy session - had discussion with pt about fall risk and purpose of PT for balance deficits/purpose of interventions, at end of session pt reporting he would like to continue with therapy. Focus of today's skilled session was balance strategies on compliant surfaces with pt needing intermittent UE support and min guard/min A at times for balance. Will continue to progress towards LTGs.  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, BPH with LUTS, HLD, PN; had cataract surgery 3 years ago  Examination-Activity Limitations Locomotion Level;Stairs;Bend  [employer] Activity;Yard Work;Other (hunting and fishing)  Pt will benefit from skilled therapeutic intervention in order to improve on the following deficits Decreased balance;Decreased strength;Difficulty walking  Stability/Clinical Decision Making Evolving/Moderate complexity  Rehab Potential Good  PT Frequency Other (comment) (2x/week x 4; 1x/week x 4)  PT Duration 8 weeks  PT Treatment/Interventions ADLs/Self Care Home Management;Aquatic Therapy;DME Instruction;Gait training;Stair training;Functional mobility  training;Therapeutic activities;Therapeutic exercise;Balance training;Neuromuscular re-education;Patient/family education  PT Next Visit Plan Continue to focus on ankle strength/stability, balance on compliant surfaces, smaller BOS, SLS, reaching forwards and to the ground, and with decreased vision; ? hyperdash to simulate quick Racket ball movements  PT Home Exercise Plan Exercises to perform on his home 60 UP balance system ([email or web address])  Consulted and Agree with Plan of Care Patient       Patient will benefit from skilled therapeutic intervention in order to improve the following deficits and impairments:     Visit Diagnosis: Repeated falls  Unsteadiness on feet  Difficulty in walking, not elsewhere classified  Other abnormalities of gait and mobility  Muscle weakness (generalized)     Problem List Patient Active Problem List   Diagnosis Date Noted  . OSA on CPAP 06/20/2018  . Anxiety 04/10/2014  . 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 History of carotid endarterectomy 04/10/2014  . Essential hypertension 04/10/2014  . Aftercare following surgery of the circulatory system, [place] 03/13/2013  . Numbness- Left neck 03/13/2013  . Occlusion and stenosis of carotid artery without mention of cerebral infarction 12/15/2011  . HTN (hypertension), malignant 12/01/2011  . Habitual alcohol use 12/01/2011  . Carotid artery stenosis, symptomatic 11/30/2011  . CVA (cerebral infarction) 11/29/2011  . Hyperlipidemia     Arliss Journey, PT, DPT 11/22/2019, 4:13 PM  [facility] [address] [place], [place], [address] Phone: [phone]   Fax:  [phone]  Name: JOSHUAH MINELLA MRN: [id number] Date of Birth: 1940/07/11

## 2019-11-26 ENCOUNTER — Encounter: Payer: Self-pay | Admitting: Physical Therapy

## 2019-11-26 ENCOUNTER — Ambulatory Visit: Payer: Medicare Other | Admitting: Physical Therapy

## 2019-11-26 ENCOUNTER — Other Ambulatory Visit: Payer: Self-pay

## 2019-11-26 DIAGNOSIS — R296 Repeated falls: Secondary | ICD-10-CM

## 2019-11-26 DIAGNOSIS — R2681 Unsteadiness on feet: Secondary | ICD-10-CM

## 2019-11-26 DIAGNOSIS — M6281 Muscle weakness (generalized): Secondary | ICD-10-CM

## 2019-11-26 DIAGNOSIS — R262 Difficulty in walking, not elsewhere classified: Secondary | ICD-10-CM

## 2019-11-26 DIAGNOSIS — R2689 Other abnormalities of gait and mobility: Secondary | ICD-10-CM

## 2019-11-26 NOTE — Therapy (Signed)
[facility] [address] [place], [place], [address] Phone: [phone]   Fax:  [phone]  Physical Therapy Treatment  Patient Details  Name: Brandon Mendoza MRN: [id number] Date of Birth: 1940-05-18 Referring Provider (PT): Basilio Cairo, MD   Encounter Date: 11/26/2019   PT End of Session - 11/26/19 1320    Visit Number 6    Number of Visits 13    Date for PT Re-Evaluation 12/20/19    Authorization Type Medicare Part A and B/AETNA supplemental    Progress Note Due on Visit 10    PT Start Time 6962    PT Stop Time 1400    PT Time Calculation (min) 42 min    Equipment Utilized During Treatment Gait belt    Activity Tolerance Patient tolerated treatment well    Behavior During Therapy WFL for tasks assessed/performed           Past Medical History:  Diagnosis Date  . Anxiety   . Carotid artery occlusion   . COPD (chronic obstructive pulmonary disease) ([place])   . Depression   . Hx of colonic polyps   . Hyperlipidemia   . Hypertension   . Nocturia   . Peripheral vascular disease ([place])   . Sleep apnea    on 6 cm, nasal pillow  . Stroke ([place]) 05/01/2011   ischemic    Past Surgical History:  Procedure Laterality Date  . CAROTID ENDARTERECTOMY  12/09/11   Left cea  . ENDARTERECTOMY  12/09/2011   Procedure: ENDARTERECTOMY CAROTID;  Surgeon: Rosetta Posner, MD;  Location: [facility];  Service: Vascular;  Laterality: Left;  . EYE SURGERY     rt retina detachment  . [place]  2004  . HERNIA REPAIR  2006  . SHOULDER ARTHROSCOPY W/ ROTATOR CUFF REPAIR  2010  . TONSILLECTOMY      There were no vitals filed for this visit.   Subjective Assessment - 11/26/19 1319    Subjective Had a fall on Sunday in the kitchen. Lost his balance. Had also consumed 2 martini's as well. Denies any injuries. No pain.    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 No/denies                  [facility] Adult PT Treatment/Exercise - 11/26/19 1330      Transfers   Transfers Sit to Stand;Stand to Sit    Sit to Stand 7: Independent    Stand to Sit 7: Independent      Ambulation/Gait   Ambulation/Gait Yes    Ambulation/Gait Assistance 6: Modified independent (Device/Increase time)    Ambulation Distance (Feet) 1000 Feet    Assistive device None    Gait Pattern Within Functional Limits    Ambulation Surface Level;Unlevel;Indoor;Outdoor;Paved;Gravel;Grass      Neuro Re-ed    Neuro Re-ed Details  for balance/muscle re-ed: gait around track with speed changes, sudden stops/starts, bwd/fwd changes in gait direction with min guard assist. no balance loss noted.                Balance Exercises - 11/26/19 1333      Balance Exercises: Standing   Rockerboard Anterior/posterior;Lateral;Head turns;EO;EC;30 seconds;Other reps (comment);Limitations;Intermittent UE support  Rockerboard Limitations both ways on balance board: rocking the board with emphasis on tall posture with EO, progressing to EC with up to min assist needed, occasional touch to walll/chair as needed; holding the board steady for EC no head movements, progressing to EC head movements left<>right, up<>down with up to min assist for balance.     Other Standing Exercises blue mat over ramp: performed facing up ramp- alternating fwd mini lunge/stepping<>back to starting point for 10 reps each side with up to min assist needed. then with feet hip width apart- EC for 30 sec's for 3 reps, min guard to min assist for balance; then performed facing down ramp- alternating fwd stepping/mini lunge for 10 reps each side, min guard assist to min assist for balance. then with feet hip width  apart for EC for 30 sec's x 3 reps. min guard to min assist for balance. pt with posterior> anterior balance loss with both facing up and down the ramp.                 PT Short Term Goals - 11/19/19 1450      PT SHORT TERM GOAL #1   Title Pt will participate in further assessment of balance during gait with FGA    Baseline met with second session    Status Achieved    Target Date --      PT SHORT TERM GOAL #2   Title Pt will improve BERG balance by 4 points to indicate lower falls risk    Baseline 11/19/19: 52/56 scored, improved by 2 points from 50/56    Time --    Period --    Status Partially Met    Target Date --      PT SHORT TERM GOAL #3   Title Pt will demonstrate independence with initial HEP    Baseline 11/19/19: met with current program    Status Achieved    Target Date --             PT Long Term Goals - 11/06/19 1606      PT LONG TERM GOAL #1   Title Pt will demonstrate independence with final HEP for balance  (LTG due by 12/20/19)    Time 8    Period Weeks    Status New      PT LONG TERM GOAL #2   Title Pt will increase FGA score by 4 points to indicate decreased falls risk when ambulating in the community    Baseline 26/30    Time 8    Period Weeks    Status New      PT LONG TERM GOAL #3   Title Pt will demonstrate ability to hold condition 4 on M-CTSIB for 10-15 seconds to indicate improved sensory integration for postural control    Baseline 4 seconds on condition 4    Time 8    Period Weeks    Status New      PT LONG TERM GOAL #4   Title Pt will demonstrate ability to ambulate 1000' outside on uneven terrain and up/down inclines/hills MOD I without LOB    Time 8    Period Weeks    Status New                 Plan - 11/26/19 1321    Clinical Impression Statement Today's skilled session continued to focus on gait on various surfaces and high level balance reaction training on compliant surfaces. Up to min assist  needed due to  balance losses in posterior direction > anterior direction. The pt is progressing toward goals and shoud benefit from continued PT to progress toward unmet goals.    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, BPH with LUTS, HLD, PN; had cataract surgery 3 years ago    Examination-Activity Limitations Locomotion Level;Stairs;Bend    [employer] Activity;Yard Work;Other   hunting and fishing   Stability/Clinical Decision Making Evolving/Moderate complexity    Rehab Potential Good    PT Frequency Other (comment)   2x/week x 4; 1x/week x 4   PT Duration 8 weeks    PT Treatment/Interventions ADLs/Self Care Home Management;Aquatic Therapy;DME Instruction;Gait training;Stair training;Functional mobility training;Therapeutic activities;Therapeutic exercise;Balance training;Neuromuscular re-education;Patient/family education    PT Next Visit Plan Continue to focus on ankle strength/stability, balance on compliant surfaces, smaller BOS, SLS, and with decreased vision;    PT Home Exercise Plan Exercises to perform on his home 60 UP balance system ([email or web address])    Consulted and Agree with Plan of Care Patient           Patient will benefit from skilled therapeutic intervention in order to improve the following deficits and impairments:  Decreased balance, Decreased strength, Difficulty walking  Visit Diagnosis: Repeated falls  Unsteadiness on feet  Difficulty in walking, not elsewhere classified  Other abnormalities of gait and mobility  Muscle weakness (generalized)     Problem List Patient Active Problem List   Diagnosis Date Noted  . OSA on CPAP 06/20/2018  . Anxiety 04/10/2014    . 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 History of carotid endarterectomy 04/10/2014  . Essential hypertension 04/10/2014  . Aftercare following surgery of the circulatory system, [place] 03/13/2013  . Numbness- Left neck 03/13/2013  . Occlusion and stenosis of carotid artery without mention of cerebral infarction 12/15/2011  . HTN (hypertension), malignant 12/01/2011  . Habitual alcohol use 12/01/2011  . Carotid artery stenosis, symptomatic 11/30/2011  . CVA (cerebral infarction) 11/29/2011  . Hyperlipidemia     Willow Ora, PTA, [facility] [address] [place], [place] [address] [phone] 11/26/19, 7:18 PM   Name: IVERY NANNEY MRN: [id number] Date of Birth: [birth year]

## 2019-11-28 ENCOUNTER — Ambulatory Visit: Payer: Medicare Other | Attending: Internal Medicine | Admitting: Physical Therapy

## 2019-11-28 ENCOUNTER — Encounter: Payer: Self-pay | Admitting: Physical Therapy

## 2019-11-28 ENCOUNTER — Other Ambulatory Visit: Payer: Self-pay

## 2019-11-28 DIAGNOSIS — R296 Repeated falls: Secondary | ICD-10-CM | POA: Diagnosis present

## 2019-11-28 DIAGNOSIS — R2689 Other abnormalities of gait and mobility: Secondary | ICD-10-CM

## 2019-11-28 DIAGNOSIS — R2681 Unsteadiness on feet: Secondary | ICD-10-CM

## 2019-11-28 DIAGNOSIS — R262 Difficulty in walking, not elsewhere classified: Secondary | ICD-10-CM

## 2019-11-28 NOTE — Therapy (Signed)
[facility] [address] [place], [place], [address] Phone: [phone]   Fax:  [phone]  Physical Therapy Treatment  Patient Details  Name: Brandon Mendoza MRN: [id number] Date of Birth: 08/08/1940 Referring Provider (PT): Basilio Cairo, MD   Encounter Date: 11/28/2019   PT End of Session - 11/28/19 1608    Visit Number 7    Number of Visits 13    Date for PT Re-Evaluation 12/20/19    Authorization Type Medicare Part A and B/AETNA supplemental    Progress Note Due on Visit 10    PT Start Time 6962    PT Stop Time 1445    PT Time Calculation (min) 40 min    Equipment Utilized During Treatment Gait belt    Activity Tolerance Patient tolerated treatment well    Behavior During Therapy [facility] for tasks assessed/performed           Past Medical History:  Diagnosis Date  . Anxiety   . Carotid artery occlusion   . COPD (chronic obstructive pulmonary disease) ([place])   . Depression   . Hx of colonic polyps   . Hyperlipidemia   . Hypertension   . Nocturia   . Peripheral vascular disease ([place])   . Sleep apnea    on 6 cm, nasal pillow  . Stroke ([place]) 05/01/2011   ischemic    Past Surgical History:  Procedure Laterality Date  . CAROTID ENDARTERECTOMY  12/09/11   Left cea  . ENDARTERECTOMY  12/09/2011   Procedure: ENDARTERECTOMY CAROTID;  Surgeon: Rosetta Posner, MD;  Location: [facility];  Service: Vascular;  Laterality: Left;  . EYE SURGERY     rt retina detachment  . [place]  2004  . HERNIA REPAIR  2006  . SHOULDER ARTHROSCOPY W/ ROTATOR CUFF REPAIR  2010  . TONSILLECTOMY      There were no vitals filed for this visit.   Subjective Assessment - 11/28/19 1411    Subjective Had a fall this morning while walking the trail.  His legs felt like "jello".  Feel into a bush.  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 No/denies                             [facility] Adult PT Treatment/Exercise - 11/28/19 0001      Transfers   Transfers Sit to Stand;Stand to Sit    Sit to Stand 7: Independent    Stand to Sit 7: Independent      Ambulation/Gait   Ambulation/Gait Yes    Ambulation/Gait Assistance 5: Supervision    Ambulation/Gait Assistance Details between activities and in/out of clinic    Assistive device None    Ambulation Surface Level;Indoor      High Level Balance   High Level Balance Activities Other (comment)    High Level Balance Comments In // bars for rockerboard both directions with head turns/nods and EC;on blue foam beam for same;side stepping on blue foam beam;standing on both sides of bosu;mini squats on black bos platform;taps to 6" step, taps to 6", 12", 6", floor.  Stepping onto/off blue foam mat.  Pt with LOB with all activities and required either bars, railing or PTA to recover LOB.  Pt tends to always loose his balance backwards.  Worked on forward step weight shift stepping and backward step weight shift stepping for balance recovery.                    PT Short Term Goals - 11/19/19 1450      PT SHORT TERM GOAL #1   Title Pt will participate in further assessment of balance during gait with FGA    Baseline met with second session    Status Achieved    Target Date --      PT SHORT TERM GOAL #2   Title Pt will improve BERG balance by 4 points to indicate lower falls risk    Baseline 11/19/19: 52/56 scored, improved by 2 points from 50/56    Time --    Period --    Status Partially Met    Target Date --      PT SHORT TERM GOAL #3   Title Pt will demonstrate independence with initial HEP    Baseline 11/19/19:  met with current program    Status Achieved    Target Date --             PT Long Term Goals - 11/06/19 1606      PT LONG TERM GOAL #1   Title Pt will demonstrate independence with final HEP for balance  (LTG due by 12/20/19)    Time 8    Period Weeks    Status New      PT LONG TERM GOAL #2   Title Pt will increase FGA score by 4 points to indicate decreased falls risk when ambulating in the community    Baseline 26/30    Time 8    Period Weeks    Status New      PT LONG TERM GOAL #3   Title Pt will demonstrate ability to hold condition 4 on M-CTSIB for 10-15 seconds to indicate improved sensory integration for postural control    Baseline 4 seconds on condition 4    Time 8    Period Weeks    Status New      PT LONG TERM GOAL #4   Title Pt will demonstrate ability to ambulate 1000' outside on uneven terrain and up/down inclines/hills MOD I without LOB    Time 8    Period Weeks    Status New                 Plan - 11/28/19 1609    Clinical Impression Statement Pt continues to report falls outside of therapy.  Session focused on balance activities.  Pt needs assist externally or by PTA to recover LOB and tends to fall backwards.  Cont per poc.    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, BPH with LUTS, HLD, PN; had cataract surgery 3 years ago    Examination-Activity Limitations Locomotion Level;Stairs;Bend    [employer] Activity;Yard Work;Other   hunting and fishing   Stability/Clinical Decision Making Evolving/Moderate complexity    Rehab Potential Good    PT Frequency Other (comment)   2x/week x 4; 1x/week x  4   PT Duration 8 weeks    PT  Treatment/Interventions ADLs/Self Care Home Management;Aquatic Therapy;DME Instruction;Gait training;Stair training;Functional mobility training;Therapeutic activities;Therapeutic exercise;Balance training;Neuromuscular re-education;Patient/family education    PT Next Visit Plan Continue to focus on ankle strength/stability, balance on compliant surfaces, smaller BOS, SLS, and with decreased vision;    PT Home Exercise Plan Exercises to perform on his home 60 UP balance system ([email or web address])    Consulted and Agree with Plan of Care Patient           Patient will benefit from skilled therapeutic intervention in order to improve the following deficits and impairments:  Decreased balance, Decreased strength, Difficulty walking  Visit Diagnosis: Repeated falls  Unsteadiness on feet  Difficulty in walking, not elsewhere classified  Other abnormalities of gait and mobility     Problem List Patient Active Problem List   Diagnosis Date Noted  . OSA on CPAP 06/20/2018  . Anxiety 04/10/2014  . 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 History of carotid endarterectomy 04/10/2014  . Essential hypertension 04/10/2014  . Aftercare following surgery of the circulatory system, [place] 03/13/2013  . Numbness- Left neck 03/13/2013  . Occlusion and stenosis of carotid artery without mention of cerebral infarction 12/15/2011  . HTN (hypertension), malignant 12/01/2011  . Habitual alcohol use 12/01/2011  . Carotid artery stenosis, symptomatic 11/30/2011  . CVA (cerebral infarction) 11/29/2011  . Hyperlipidemia     Narda Bonds, [place] 11/28/19 4:11 PM Phone: [phone] Fax: [facility] [address] [place], [place], [address] Phone: [phone]   Fax:  [phone]  Name: [place] Mendoza MRN: [id number] Date of Birth:  Jul 28, 1940

## 2019-12-03 ENCOUNTER — Ambulatory Visit: Payer: Medicare Other | Admitting: Physical Therapy

## 2019-12-10 ENCOUNTER — Encounter: Payer: Self-pay | Admitting: Physical Therapy

## 2019-12-10 NOTE — Therapy (Signed)
[facility] [address] [place], [place], [address] Phone: [phone]   Fax:  [phone]  Patient Details  Name: Brandon Mendoza MRN: [id number] Date of Birth: 1940/05/04 Referring Provider:  No ref. provider found  Encounter Date: 12/10/2019  PHYSICAL THERAPY DISCHARGE SUMMARY  Visits from Start of Care: 7  Current functional level related to goals / functional outcomes: Unable to fully assess.  Pt called clinic to cancel remaining visits.  Pt stated, he does not think that it is doing any good and he also has had a accident and can not get to his appointments.  Pt was continuing to experience falls at home despite therapy.    Remaining deficits: Impaired balance, falls   Education / Equipment: HEP  Plan: Patient agrees to discharge.  Patient goals were not met. Patient is being discharged due to the patient's request.  ?????     Rico Junker, PT, DPT 12/10/19    8:51 AM    [facility] [address] [place], [place], [address] Phone: [phone]   Fax:  [phone]

## 2019-12-12 ENCOUNTER — Ambulatory Visit: Payer: Medicare Other | Admitting: Physical Therapy

## 2019-12-19 ENCOUNTER — Ambulatory Visit: Payer: Medicare Other | Admitting: Physical Therapy

## 2019-12-26 ENCOUNTER — Ambulatory Visit: Payer: Medicare Other | Admitting: Physical Therapy

## 2019-12-27 DIAGNOSIS — M25561 Pain in right knee: Secondary | ICD-10-CM | POA: Insufficient documentation

## 2019-12-30 DIAGNOSIS — D6869 Other thrombophilia: Secondary | ICD-10-CM | POA: Insufficient documentation

## 2020-01-07 ENCOUNTER — Ambulatory Visit: Payer: Medicare Other | Attending: Internal Medicine

## 2020-01-07 DIAGNOSIS — Z23 Encounter for immunization: Secondary | ICD-10-CM

## 2020-01-07 NOTE — Progress Notes (Signed)
   Covid-19 Vaccination Clinic  Name:  DILAN NOVOSAD    MRN: [id number] DOB: 06/19/1940  01/07/2020  Mr. Rosengren was observed post Covid-19 immunization for 15 minutes without incident. He was provided with Vaccine Information Sheet and instruction to access the V-Safe system.   Mr. Gales was instructed to call 911 with any severe reactions post vaccine: Marland Kitchen Difficulty breathing  . Swelling of face and throat  . A fast heartbeat  . A bad rash all over body  . Dizziness and weakness

## 2020-01-22 DIAGNOSIS — M79672 Pain in left foot: Secondary | ICD-10-CM | POA: Insufficient documentation

## 2020-02-07 DIAGNOSIS — S92353A Displaced fracture of fifth metatarsal bone, unspecified foot, initial encounter for closed fracture: Secondary | ICD-10-CM | POA: Insufficient documentation

## 2020-05-25 ENCOUNTER — Ambulatory Visit: Payer: Medicare Other | Admitting: Neurology

## 2020-05-26 ENCOUNTER — Ambulatory Visit (INDEPENDENT_AMBULATORY_CARE_PROVIDER_SITE_OTHER): Payer: Medicare Other | Admitting: Physician Assistant

## 2020-05-26 ENCOUNTER — Other Ambulatory Visit: Payer: Self-pay

## 2020-05-26 ENCOUNTER — Ambulatory Visit (HOSPITAL_COMMUNITY)
Admission: RE | Admit: 2020-05-26 | Discharge: 2020-05-26 | Disposition: A | Discharge status: Home / Self Care | Payer: Medicare Other | Source: Ambulatory Visit | Attending: Vascular Surgery | Admitting: Vascular Surgery

## 2020-05-26 VITALS — BP 135/75 | HR 60 | Temp 98.7°F | Resp 20 | Ht 68.0 in | Wt 194.5 lb

## 2020-05-26 DIAGNOSIS — I6521 Occlusion and stenosis of right carotid artery: Secondary | ICD-10-CM | POA: Diagnosis not present

## 2020-05-26 DIAGNOSIS — I4891 Unspecified atrial fibrillation: Secondary | ICD-10-CM

## 2020-05-26 DIAGNOSIS — Z7901 Long term (current) use of anticoagulants: Secondary | ICD-10-CM

## 2020-05-26 DIAGNOSIS — I6522 Occlusion and stenosis of left carotid artery: Secondary | ICD-10-CM

## 2020-05-26 DIAGNOSIS — Z9889 Other specified postprocedural states: Secondary | ICD-10-CM | POA: Insufficient documentation

## 2020-05-26 NOTE — Progress Notes (Signed)
History of Present Illness:  Patient is a 80 y.o. year old male who presents for evaluation of carotid stenosis. s/p left carotid endarterectomy 12/09/11 by Dr. Edilia Bo for symptomatic carotid stenosis. 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     The patient denies symptoms of TIA, amaurosis, or stroke.  He stays active on a daily basis.  He does have symptoms of sciatic and is in the process of lumbar spine intervention for spinal stenosis.  The pt on a statin for cholesterol management.  The pt not on a daily aspirin due to bruising.   Other AC:  Plavix The pt is on lisinopril for hypertension.   The pt is not diabetic.  Tobacco hx:  Former smoker, quit 2006 Past Medical History:  Diagnosis Date  . Anxiety   . Carotid artery occlusion   . COPD (chronic obstructive pulmonary disease) ([facility])   . Depression   . Hx of colonic polyps   . Hyperlipidemia   . Hypertension   . Nocturia   . Peripheral vascular disease ([facility])   . Sleep apnea    on 6 cm, nasal pillow  . 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 CAROTID ENDARTERECTOMY  12/09/11   Left cea  . ENDARTERECTOMY  12/09/2011   Procedure: ENDARTERECTOMY CAROTID;  Surgeon: Larina Earthly, MD;  Location: [facility] OR;  Service: Vascular;  Laterality: Left;  . EYE SURGERY     rt retina detachment  . [place] TOE SURGERY  2004  . HERNIA REPAIR  2006  . SHOULDER ARTHROSCOPY W/ ROTATOR CUFF REPAIR  2010  . TONSILLECTOMY       Social History Social History   Tobacco Use  . Smoking status: Former Smoker    Packs/day: 1.00    Years: 50.00    Pack years: 50.00    Types: Cigarettes    Quit date: 08/26/2004    Years since quitting: 15.7  . Smokeless tobacco: Never Used  . Tobacco comment: quit smoking 08/2004  Substance Use Topics  . Alcohol use: Yes    Alcohol/week: 14.0 standard drinks    Types: 14 Shots of liquor  per week    Comment: 2-3 daily cocktails  . Drug use: No    Family History Family History  Problem Relation Age of Onset  . Alzheimer's disease Father   . Diabetes Other     Allergies  No Known Allergies   Current Outpatient Medications  Medication Sig Dispense Refill  . ALPRAZolam (XANAX) 0.5 MG tablet Take 0.5 mg by mouth at bedtime as needed. For anxiety    . Ascorbic Acid (VITAMIN C PO) Take 2 tablets by mouth daily.    . clopidogrel (PLAVIX) 75 MG tablet Take 75 mg by mouth daily with breakfast.    . lisinopril (PRINIVIL,ZESTRIL) 10 MG tablet Take 10 mg by mouth.    . Misc Natural Products (GLUCOSAMINE CHONDROITIN ADV PO) Take 1 tablet by mouth daily.    . Multiple Vitamins-Minerals (MULTIVITAMINS THER. W/MINERALS) TABS Take 1 tablet by mouth daily.    . Omega-3 Fatty Acids (FISH OIL PO) Take 1 capsule by mouth daily.    . Rosuvastatin Calcium (CRESTOR PO) Take by mouth daily.    . sertraline (ZOLOFT) 50 MG tablet TAKE ONE TABLET BY MOUTH ONCE DAILY 30 tablet 0   No current facility-administered medications for  this visit.    ROS:   General:  No weight loss, Fever, chills  HEENT: No recent headaches, no nasal bleeding, no visual changes, no sore throat  Neurologic: No dizziness, blackouts, seizures. No recent symptoms of stroke or mini- stroke. No recent episodes of slurred speech, or temporary blindness.  Cardiac: No recent episodes of chest pain/pressure, no shortness of breath at rest.  No shortness of breath with exertion.  positive history of atrial fibrillation or irregular heartbeat  Vascular: No history of rest pain in feet.  No history of claudication.  No history of non-healing ulcer, No history of DVT   Pulmonary: No home oxygen, no productive cough, no hemoptysis,  No asthma or wheezing  Musculoskeletal:  [ ]  Arthritis, [x ] Low back pain,  [ ]  Joint pain  Hematologic:No history of hypercoagulable state.  No history of easy bleeding.  No history of  anemia  Gastrointestinal: No hematochezia or melena,  No gastroesophageal reflux, no trouble swallowing  Urinary: [ ]  chronic Kidney disease, [ ]  on HD - [ ]  MWF or [ ]  TTHS, [ ]  Burning with urination, [ ]  Frequent urination, [ ]  Difficulty urinating;   Skin: No rashes  Psychological: No history of anxiety,  No history of depression   Physical Examination  Vitals:   05/26/20 1324 05/26/20 1326  BP: 139/73 135/75  Pulse: 60   Resp: 20   Temp: 98.7 F (37.1 C)   TempSrc: Temporal   SpO2: 96%   Weight: 194 lb 8 oz (88.2 kg)   Height: 5\' 8"  (1.727 m)     Body mass index is 29.57 kg/m.  General:  Alert and oriented, no acute distress HEENT: Normal Neck: No bruit or JVD Pulmonary: Clear to auscultation bilaterally Cardiac: Regular Rate and Rhythm without murmur Gastrointestinal: Soft, non-tender, non-distended, no mass, no scars Skin: No rash Extremity Pulses:  2+ radial, brachial, femoral, dorsalis pedis, posterior tibial pulses bilaterally Musculoskeletal: No deformity or edema  Neurologic: Upper and lower extremity motor 5/5 and symmetric  DATA:  Right Carotid Findings:  +----------+--------+--------+--------+------------------+--------+       PSV cm/sEDV cm/sStenosisPlaque DescriptionComments  +----------+--------+--------+--------+------------------+--------+  CCA Prox 157   14                      +----------+--------+--------+--------+------------------+--------+  CCA Mid  138   20                      +----------+--------+--------+--------+------------------+--------+  CCA Distal112   17       heterogenous         +----------+--------+--------+--------+------------------+--------+  ICA Prox 119   21   1-39%  heterogenous         +----------+--------+--------+--------+------------------+--------+  ICA Mid  122   23                       +----------+--------+--------+--------+------------------+--------+  ICA Distal110   20                      +----------+--------+--------+--------+------------------+--------+  ECA    162   13       heterogenous         +----------+--------+--------+--------+------------------+--------+   +----------+--------+-------+----------------+-------------------+       PSV cm/sEDV cmsDescribe    Arm Pressure (mmHG)  +----------+--------+-------+----------------+-------------------+        Multiphasic, WNL            +----------+--------+-------+----------------+-------------------+   +---------+--------+--+--------+--+---------+  VertebralPSV cm/s81EDV cm/s11Antegrade  +---------+--------+--+--------+--+---------+      Left Carotid Findings:  +----------+--------+--------+--------+------------------+--------+       PSV cm/sEDV cm/sStenosisPlaque DescriptionComments  +----------+--------+--------+--------+------------------+--------+  CCA Prox 146   14                      +----------+--------+--------+--------+------------------+--------+  CCA Mid  141   22                      +----------+--------+--------+--------+------------------+--------+  CCA Distal137   17       homogeneous          +----------+--------+--------+--------+------------------+--------+  ICA Prox 85   13                      +----------+--------+--------+--------+------------------+--------+  ICA Mid  78   13                      +----------+--------+--------+--------+------------------+--------+  ICA Distal73   17                      +----------+--------+--------+--------+------------------+--------+  ECA     105   7                       +----------+--------+--------+--------+------------------+--------+   +----------+--------+--------+----------------+-------------------+       PSV cm/sEDV cm/sDescribe    Arm Pressure (mmHG)  +----------+--------+--------+----------------+-------------------+  [id number]       Multiphasic, WNL            +----------+--------+--------+----------------+-------------------+   +---------+--------+--+--------+--+---------+  VertebralPSV cm/s67EDV cm/s12Antegrade  +---------+--------+--+--------+--+---------+        Summary:  Right Carotid: Velocities in the right ICA are consistent with a 1-39%  stenosis.   Left Carotid: Patent left carotid endarterecomty with no evidence of  restenosis.   ASSESSMENT:  Carotid stenosis with asymptomatic left stenosis s/p left CEA by Dr. Arbie Cookey 2013. He denise any recent symptoms within the lat year of stroke or TIA. The duplex reveals right stenosis< 39% and left patent without evidence of recurrent stenosis.  PLAN: He will stay as active as possible, continue taking Eliquis fo Afib, Crestor and Plavix.  F/U in 1 year for repeat carotid Duplex.   Mosetta Pigeon PA-C [facility] Office: [phone]  MD in clinic [place]

## 2020-08-17 NOTE — Progress Notes (Addendum)
PATIENT: Brandon Mendoza DOB: [birth year]  REASON FOR VISIT: follow up HISTORY FROM: patient  Chief Complaint  Patient presents with  . Obstructive Sleep Apnea    Rm 1, alone, doing well on cpap      HISTORY OF PRESENT ILLNESS: 08/18/20 ALL: Mr [name] returns for annual follow up for OSA on CPAP therapy. He is doing very well on CPAP. He is using CPAP nightly. He does continue to wake multiple times at night to urinate. He does not sleep much longer than 5-6 hours. He denies concerns with CPAP or supplies.   He is concerned about multiple falls over the past year. At last follow up with me, an irregular heart rate was noted. He had follow up with PCP and referred to cardiology for atrial fibrillation. He was started on flecainide and Eliquis. Plavix discontinued (prior CVA in 2002). Since, he reports that with prolonged activity like walking or mowing the lawn, he has bilateral leg weakness and will often fall. He feels that his legs just give out and he collapses. He was an avid racquet ball player and walked several miles a day prior to diagnosis of afib. He has been seen by Dr Maple HudsonMoser, neurology, who diagnosed idiopathic axonal neuropathy. He was referred to Dr Roderic OvensNorth for placement of Vestiflex due to significant lumbar spine disease. Pain has improved but balance difficulty remains. He has completed PT with little improvement. He does have intermittent dizziness while standing but does not feel this occurs with leg weakness and he does not fall when dizzy. No lightheadedness, palpitations, or chest pain. He has decided to wean Xanax 0.5mg  taken daily for anxiety to see if this helps. He was offered a trial of Sinemet and/or lumbar puncture and nystagmogram. Myasthenia labs were negative. He is frustrated, today, stating that he does not have a primary care provider. Dr Julius BowelsPollock retired last year and Dr Sharol Givenearson is retiring this month. He request a referral to local PCP.      08/05/2019 ALL:   Brandon BalJohn H Hain is a 80 y.o. male here today for follow up of OSA on CPAP therapy.  He reports he is doing very well.  He denies any concerns with his CPAP machine or supplies.  He is using CPAP nearly every night.  Occasionally he will miss a night if he goes hunting.  He definitely feels that CPAP has helped with improved sleep quality and increased energy levels.  He continues to play racquetball.  He was recently seen by primary care.  He did report some dizziness and feeling off balance for the past few months.  He does note that this seems to be worse when he is playing racquetball.  He is also noticed feeling off balance when walking in the woods.  He denies any chest pain, trouble breathing or palpitations.  He does not have a history of A. fib that he is aware of.  He is on Plavix status post CVA in 2013.  Compliance report dated 07/12/2019 through 08/10/2019 reveals that he used CPAP 28 of the past 30 days for compliance of 93%.  He used CPAP greater than 4 hours 28 of the past 30 days for compliance of 93%.  Average usage was 5 hours and 57 minutes.  Residual AHI was 1.6 on 5 to 10 cm of water and an EPR of 2.  There was no significant leak noted.  HISTORY: (copied from my note on 06/21/2018)  Brandon BalJohn H Marlette is a 80  y.o. male for follow up of OSA on CPAP.  Mr. reports that he is doing well with his new CPAP machine.  He denies any concerns.  He is in need of new supplies.  Compliance report data as follows:  05/22/2018 - 06/20/2018  Compliance Report Usage 05/22/2018 - 06/20/2018 Usage days 30/30 days (100%) >= 4 hours 30 days (100%) < 4 hours 0 days (0%) Usage hours 173 hours 29 minutes Average usage (total days) 5 hours 47 minutes Average usage (days used) 5 hours 47 minutes Median usage (days used) 5 hours 33 minutes Total used hours (value since last reset - 06/20/2018) 177 hours AirSense 10 AutoSet Serial number [id number] Mode AutoSet Min Pressure 5 cmH2O Max Pressure 10  cmH2O EPR Fulltime EPR level 2 Response Standard Therapy Pressure - cmH2O Median: 7.8 95th percentile: 9.7 Maximum: 9.9 Leaks - L/min Median: 1.0 95th percentile: 9.3 Maximum: 16.1 Events per hour AI: 1.4 HI: 0.2 AHI: 1.6 Apnea Index Central: 0.7 Obstructive: 0.6 Unknown: 0.0 RERA Index 0.1 Cheyne-Stokes respiration (average duration per night) 0 minutes (0%)   History (copied from Dr Dohmeier's note on 03/22/2018)  JWJ:XBJY H Gerberis a 80 y.o.male, seen on RV on 03-22-2018 for CPAP compliance but did not bring his CPAP nor the memory chip. His machine is now 81 years old, and he is entitled to a new one, but feels his old one is still working well. He has 3 nocturias and still snores, and he wants to watch TV in his bedroom- his wife has moved to another room. He has several Martinis each night and takes Xanax (!). Mr. BRADELY RUDIN is a meanwhile 80 year old Caucasian right-handed gentleman with a history of stroke and aftercare through Dr.Sethiand later through [name]. He has been on his CPAP for about 6 years now. I do not have the compliance data available but he will bring them by today or tomorrow. A discussion is if he needs a new machine he is entitled to 1, I would like for him to have the best possible and interrupted apnea care and is my experience that the machines are set with a software that makes him obsolete after 6 to 7 years anyway. He will undergo HST but is not willing to undergo PSG. He understands this will likely lead to a autotitration CPAP, nasal pillows.   He wasseen here as in a referral from [name] in 2017. He is an establsished stroke patient , who had undergone a sleep study in 2013 and has been using his CPAP - Compliantly since. He had no follow up in the sleep clinic in the last 3 years and hasnorecollection of seeing me before. I have a note here from my last visit with Mr. Blank from 14 May 2012 the patient was at the time followed  by 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 Carotid Doppler showed a left ICA stenosis 60-80%, he was briefly hospitalized with very high systolic blood pressures but did well afterwards he was tolerating aspirin and Plavix. I was able to follow up with a sleep study on 08 March 2012 and the patient was tested positive for sleep apnea, his apnea was strictly obstructive in origin but to a high degree his AHI was 59.1, REM AHI was under 2.1, he slept all night supine. Nadir of oxygen  saturation was at 86% with 19 minutes of desaturation time totally. No PLMs.  He was titrated to CPAP at a very low pressure of only 6 cmH2O which alleviated his apnea completely. He has continued to use his CPAP compliantly ever since and recently tried to get new supplies but he was told that he has to follow up with me first. Due to the new Medicare requirements he will now be seen once a year. I was able to get a compliance report on his machine which is Wi-Fi compatible. He is 100% compliant for the last 30 days with 83 compliance 4 hours. His average use of time is 5 hours and 1 minute at night. He feels that the CPAP helps him sleep, he still has 2 times nocturia each night and often does not put the mask back on after his bathroom break therefore the reduced number of hours. He sleeps at least 7 hours nightly. His residual AHI is 2.6 on a setting of 6 cm water pressure with 2 cm EPR and the residual apneas are obstructive in nature. I would not see any reason to change him from his current machine or setting he has very few air leaks. He endorsed a low degree of fatigue at 9 point and the Epworth sleepiness score at only 5 points, the geriatric depression score was endorsed at 2 out of 15 points. Basically I will have to certify today  that the patient is compliant and that he can get new supplies. The patient is insured through [employer].  Sleep habits are as follows: Patient sleeps 7 hours on average at night, 2 nocturias. He sleeps in a differenet bedroom form his wife, watches TV in Bed, but wears his CPAP after 10 PM, and drifts to sleep when the TV is still on.  He rises at 5.30 AM and goes to gym 6 mornings a week, and on other days goes hunting.   Sleep medical history and family sleep history: daughter has OSA on CPAP, lives in [place]. Father had OSA , untreated.   Social history: married, adult children.    REVIEW OF SYSTEMS: Out of a complete 14 system review of symptoms, the patient complains only of the following symptoms, dizziness, imbalance and all other reviewed systems are negative.  ESS: 8 FSS: 26  ALLERGIES: No Known Allergies  HOME MEDICATIONS: Outpatient Medications Prior to Visit  Medication Sig Dispense Refill  . ALPRAZolam (XANAX) 0.5 MG tablet Take 0.5 mg by mouth at bedtime as needed. For anxiety    . Ascorbic Acid (VITAMIN C PO) Take 2 tablets by mouth daily.    Marland Kitchen lisinopril (PRINIVIL,ZESTRIL) 10 MG tablet Take 10 mg by mouth.    . Misc Natural Products (GLUCOSAMINE CHONDROITIN ADV PO) Take 1 tablet by mouth daily.    . Multiple Vitamins-Minerals (MULTIVITAMINS THER. W/MINERALS) TABS Take 1 tablet by mouth daily.    . Omega-3 Fatty Acids (FISH OIL PO) Take 1 capsule by mouth daily.    . Rosuvastatin Calcium (CRESTOR PO) Take by mouth daily.    . sertraline (ZOLOFT) 50 MG tablet TAKE ONE TABLET BY MOUTH ONCE DAILY (Patient taking differently: 100 mg) 30 tablet 0  . apixaban (ELIQUIS) 5 MG TABS tablet Bid    . clopidogrel (PLAVIX) 75 MG tablet Take 75 mg by mouth daily with breakfast.     No facility-administered medications prior to visit.    PAST MEDICAL HISTORY: Past Medical History:  Diagnosis Date  . Anxiety   .  Carotid artery occlusion   . COPD (chronic obstructive  pulmonary disease) (HCC)   . Depression   . Hx of colonic polyps   . Hyperlipidemia   . Hypertension   . Nocturia   . Peripheral vascular disease (HCC)   . Sleep apnea    on 6 cm, nasal pillow  . Stroke (HCC) 05/01/2011   ischemic    PAST SURGICAL HISTORY: Past Surgical History:  Procedure Laterality Date  . CAROTID ENDARTERECTOMY  12/09/11   Left cea  . ENDARTERECTOMY  12/09/2011   Procedure: ENDARTERECTOMY CAROTID;  Surgeon: Larina Earthly, MD;  Location: [facility] OR;  Service: Vascular;  Laterality: Left;  . EYE SURGERY     rt retina detachment  . HAMMER TOE SURGERY  2004  . HERNIA REPAIR  2006  . SHOULDER ARTHROSCOPY W/ ROTATOR CUFF REPAIR  2010  . TONSILLECTOMY      FAMILY HISTORY: Family History  Problem Relation Age of Onset  . Alzheimer's disease Father   . Diabetes Other     SOCIAL HISTORY: Social History   Socioeconomic History  . Marital status: Married    Spouse name: [name]  . Number of children: 4  . Years of education: College  . Highest education level: Not on file  Occupational History  . Occupation: Retired  Tobacco Use  . Smoking status: Former Smoker    Packs/day: 1.00    Years: 50.00    Pack years: 50.00    Types: Cigarettes    Quit date: 08/26/2004    Years since quitting: 15.9  . Smokeless tobacco: Never Used  . Tobacco comment: quit smoking 08/2004  Substance and Sexual Activity  . Alcohol use: Yes    Alcohol/week: 14.0 standard drinks    Types: 14 Shots of liquor per week    Comment: 2-3 daily cocktails  . Drug use: No  . Sexual activity: Not on file  Other Topics Concern  . Not on file  Social History Narrative   Patient is married with 4 children.   Patient is right handed.   Patient has college education.   Patient drinks 3 cups daily.   Social Determinants of Health   Financial Resource Strain: Not on file  Food Insecurity: Not on file  Transportation Needs: Not on file  Physical Activity: Not on file  Stress: Not on file   Social Connections: Not on file  Intimate Partner Violence: Not on file      PHYSICAL EXAM  Vitals:   08/18/20 0722  BP: (!) 166/74  Pulse: 70  Weight: 192 lb (87.1 kg)  Height: 5\' 8"  (1.727 m)   Body mass index is 29.19 kg/m.  Generalized: Well developed, in no acute distress  Cardiology:  heart rhythm normal, no murmur noted Respiratory: clear to auscultation bilaterally  Neurological examination  Mentation: Alert oriented to time, place, history taking. Follows all commands speech and language fluent Cranial nerve II-XII: Pupils were equal round reactive to light. Extraocular movements were full, visual field were full  Motor: The motor testing reveals 5 over 5 strength of all 4 extremities. Good symmetric motor tone is noted throughout.  Gait and station: Gait is wide but stable.   DIAGNOSTIC DATA (LABS, IMAGING, TESTING) - I reviewed patient records, labs, notes, testing and imaging myself where available.  No flowsheet data found.   Lab Results  Component Value Date   WBC 8.9 12/10/2011   HGB 14.4 12/10/2011   HCT 40.9 12/10/2011   MCV 88.7 12/10/2011  PLT 197 12/10/2011      Component Value Date/Time   NA 136 12/10/2011 0355   K 4.1 12/10/2011 0355   CL 103 12/10/2011 0355   CO2 24 12/10/2011 0355   GLUCOSE 134 (H) 12/10/2011 0355   BUN 15 12/10/2011 0355   CREATININE 0.86 12/10/2011 0355   CALCIUM 8.7 12/10/2011 0355   PROT 7.3 11/29/2011 1519   ALBUMIN 4.2 11/29/2011 1519   AST 22 11/29/2011 1519   ALT 16 11/29/2011 1519   ALKPHOS 68 11/29/2011 1519   BILITOT 0.4 11/29/2011 1519   GFRNONAA 85 (L) 12/10/2011 0355   GFRAA >90 12/10/2011 0355   Lab Results  Component Value Date   CHOL 148 11/30/2011   HDL 66 11/30/2011   LDLCALC 65 11/30/2011   TRIG 87 11/30/2011   CHOLHDL 2.2 11/30/2011   Lab Results  Component Value Date   HGBA1C 5.7 (H) 11/30/2011   No results found for: VITAMINB12 No results found for: TSH     ASSESSMENT AND  PLAN 80 y.o. year old male  has a past medical history of Anxiety, Carotid artery occlusion, COPD (chronic obstructive pulmonary disease) (HCC), Depression, colonic polyps, Hyperlipidemia, Hypertension, Nocturia, Peripheral vascular disease (HCC), Sleep apnea, and Stroke (HCC) (05/01/2011). here with     ICD-10-CM   1. OSA on CPAP  G47.33 For home use only DME continuous positive airway pressure (CPAP)   Z99.89   2. Atrial fibrillation, unspecified type [facility])  I48.91 Ambulatory referral to [facility]  3. Multiple falls  R29.6   4. Bilateral leg weakness  R29.898      Mr. August Luz is doing very well from a sleep apnea perspective.  Compliance report reveals excellent compliance.  He was encouraged to continue CPAP nightly for greater than 4 hours each night. We have discussed concerns of multiple falls related to bilateral leg weakness over the past year. He continues to follow Dr Maple Hudson with [facility] neurology but is needing a new PCP. I will refer him to Dr Trinna Post. He was advised to continue healthy lifestyle habits. Fall precautions advised.   He will follow-up with me in 1 year for CPAP compliance review, sooner if needed he verbalizes understanding and agreement with this plan.   Orders Placed This Encounter  Procedures  . For home use only DME continuous positive airway pressure (CPAP)    Supplies    Order Specific Question:   Length of Need    Answer:   Lifetime    Order Specific Question:   Patient has OSA or probable OSA    Answer:   Yes    Order Specific Question:   Is the patient currently using CPAP in the home    Answer:   Yes    Order Specific Question:   Settings    Answer:   Other see comments    Order Specific Question:   CPAP supplies needed    Answer:   Mask, headgear, cushions, filters, heated tubing and water chamber  . Ambulatory referral to [facility]    Referral Priority:   Routine    Referral Type:   Consultation    Referral Reason:   Specialty Services  Required    Requested Specialty:   Family Medicine    Number of Visits Requested:   1     No orders of the defined types were placed in this encounter.     I spent 35 minutes with the patient. 50% of this time was spent counseling  and educating patient on plan of care and medications.     Shawnie Dapper, FNP-C 08/18/2020, 8:43 AM [facility] [address] [place], [place] [address] [phone]

## 2020-08-17 NOTE — Patient Instructions (Addendum)
Please continue using your CPAP regularly. While your insurance requires that you use CPAP at least 4 hours each night on 70% of the nights, I recommend, that you not skip any nights and use it throughout the night if you can. Getting used to CPAP and staying with the treatment long term does take time and patience and discipline. Untreated obstructive sleep apnea when it is moderate to severe can have an adverse impact on cardiovascular health and raise her risk for heart disease, arrhythmias, hypertension, congestive heart failure, stroke and diabetes. Untreated obstructive sleep apnea causes sleep disruption, nonrestorative sleep, and sleep deprivation. This can have an impact on your day to day functioning and cause daytime sleepiness and impairment of cognitive function, memory loss, mood disturbance, and problems focussing. Using CPAP regularly can improve these symptoms.  I will send a referral to Dr Neva Seat.   Follow up in 1 year   Sleep Apnea Sleep apnea affects breathing during sleep. It causes breathing to stop for a short time or to become shallow. It can also increase the risk of:  Heart attack.  Stroke.  Being very overweight (obese).  Diabetes.  Heart failure.  Irregular heartbeat. The goal of treatment is to help you breathe normally again. What are the causes? There are three kinds of sleep apnea:  Obstructive sleep apnea. This is caused by a blocked or collapsed airway.  Central sleep apnea. This happens when the brain does not send the right signals to the muscles that control breathing.  Mixed sleep apnea. This is a combination of obstructive and central sleep apnea. The most common cause of this condition is a collapsed or blocked airway. This can happen if:  Your throat muscles are too relaxed.  Your tongue and tonsils are too large.  You are overweight.  Your airway is too small.   What increases the risk?  Being overweight.  Smoking.  Having a  small airway.  Being older.  Being male.  Drinking alcohol.  Taking medicines to calm yourself (sedatives or tranquilizers).  Having family members with the condition. What are the signs or symptoms?  Trouble staying asleep.  Being sleepy or tired during the day.  Getting angry a lot.  Loud snoring.  Headaches in the morning.  Not being able to focus your mind (concentrate).  Forgetting things.  Less interest in sex.  Mood swings.  Personality changes.  Feelings of sadness (depression).  Waking up a lot during the night to pee (urinate).  Dry mouth.  Sore throat. How is this diagnosed?  Your medical history.  A physical exam.  A test that is done when you are sleeping (sleep study). The test is most often done in a sleep lab but may also be done at home. How is this treated?  Sleeping on your side.  Using a medicine to get rid of mucus in your nose (decongestant).  Avoiding the use of alcohol, medicines to help you relax, or certain pain medicines (narcotics).  Losing weight, if needed.  Changing your diet.  Not smoking.  Using a machine to open your airway while you sleep, such as: ? An oral appliance. This is a mouthpiece that shifts your lower jaw forward. ? A CPAP device. This device blows air through a mask when you breathe out (exhale). ? An EPAP device. This has valves that you put in each nostril. ? A BPAP device. This device blows air through a mask when you breathe in (inhale) and breathe out.  Having surgery if other treatments do not work. It is important to get treatment for sleep apnea. Without treatment, it can lead to:  High blood pressure.  Coronary artery disease.  In men, not being able to have an erection (impotence).  Reduced thinking ability.   Follow these instructions at home: Lifestyle  Make changes that your doctor recommends.  Eat a healthy diet.  Lose weight if needed.  Avoid alcohol, medicines to help  you relax, and some pain medicines.  Do not use any products that contain nicotine or tobacco, such as cigarettes, e-cigarettes, and chewing tobacco. If you need help quitting, ask your doctor. General instructions  Take over-the-counter and prescription medicines only as told by your doctor.  If you were given a machine to use while you sleep, use it only as told by your doctor.  If you are having surgery, make sure to tell your doctor you have sleep apnea. You may need to bring your device with you.  Keep all follow-up visits as told by your doctor. This is important. Contact a doctor if:  The machine that you were given to use during sleep bothers you or does not seem to be working.  You do not get better.  You get worse. Get help right away if:  Your chest hurts.  You have trouble breathing in enough air.  You have an uncomfortable feeling in your back, arms, or stomach.  You have trouble talking.  One side of your body feels weak.  A part of your face is hanging down. These symptoms may be an emergency. Do not wait to see if the symptoms will go away. Get medical help right away. Call your local emergency services (911 in the [place] Do not drive yourself to the hospital. Summary  This condition affects breathing during sleep.  The most common cause is a collapsed or blocked airway.  The goal of treatment is to help you breathe normally while you sleep. This information is not intended to replace advice given to you by your health care provider. Make sure you discuss any questions you have with your health care provider. Document Revised: 12/29/2017 Document Reviewed: 11/07/2017 Elsevier Patient Education  2021 [employer].

## 2020-08-18 ENCOUNTER — Ambulatory Visit (INDEPENDENT_AMBULATORY_CARE_PROVIDER_SITE_OTHER): Payer: Medicare Other | Admitting: Family Medicine

## 2020-08-18 ENCOUNTER — Encounter: Payer: Self-pay | Admitting: Family Medicine

## 2020-08-18 VITALS — BP 166/74 | HR 70 | Ht 68.0 in | Wt 192.0 lb

## 2020-08-18 DIAGNOSIS — R296 Repeated falls: Secondary | ICD-10-CM

## 2020-08-18 DIAGNOSIS — R29898 Other symptoms and signs involving the musculoskeletal system: Secondary | ICD-10-CM | POA: Diagnosis not present

## 2020-08-18 DIAGNOSIS — I4891 Unspecified atrial fibrillation: Secondary | ICD-10-CM | POA: Diagnosis not present

## 2020-08-18 DIAGNOSIS — G4733 Obstructive sleep apnea (adult) (pediatric): Secondary | ICD-10-CM

## 2020-08-18 DIAGNOSIS — Z9989 Dependence on other enabling machines and devices: Secondary | ICD-10-CM

## 2020-09-12 ENCOUNTER — Emergency Department (HOSPITAL_COMMUNITY)
Admission: EM | Admit: 2020-09-12 | Discharge: 2020-09-12 | Disposition: A | Discharge status: Home / Self Care | Payer: Medicare Other | Source: Home / Self Care | Attending: Emergency Medicine | Admitting: Emergency Medicine

## 2020-09-12 ENCOUNTER — Other Ambulatory Visit: Payer: Self-pay

## 2020-09-12 ENCOUNTER — Emergency Department (HOSPITAL_COMMUNITY): Payer: Medicare Other

## 2020-09-12 DIAGNOSIS — M25551 Pain in right hip: Secondary | ICD-10-CM | POA: Diagnosis not present

## 2020-09-12 DIAGNOSIS — Z79899 Other long term (current) drug therapy: Secondary | ICD-10-CM | POA: Insufficient documentation

## 2020-09-12 DIAGNOSIS — W1830XA Fall on same level, unspecified, initial encounter: Secondary | ICD-10-CM | POA: Insufficient documentation

## 2020-09-12 DIAGNOSIS — Z87891 Personal history of nicotine dependence: Secondary | ICD-10-CM | POA: Insufficient documentation

## 2020-09-12 DIAGNOSIS — Z7901 Long term (current) use of anticoagulants: Secondary | ICD-10-CM | POA: Insufficient documentation

## 2020-09-12 DIAGNOSIS — I10 Essential (primary) hypertension: Secondary | ICD-10-CM | POA: Insufficient documentation

## 2020-09-12 DIAGNOSIS — S7001XA Contusion of right hip, initial encounter: Secondary | ICD-10-CM | POA: Insufficient documentation

## 2020-09-12 DIAGNOSIS — J449 Chronic obstructive pulmonary disease, unspecified: Secondary | ICD-10-CM | POA: Insufficient documentation

## 2020-09-12 DIAGNOSIS — S7011XA Contusion of right thigh, initial encounter: Secondary | ICD-10-CM | POA: Diagnosis not present

## 2020-09-12 IMAGING — CR DG HIP (WITH OR WITHOUT PELVIS) 2-3V*R*
3 series · 3 of 3 positions shown · non-contrast
Comparison: None.

CLINICAL DATA: Fall.  Right hip pain.

EXAM:
DG HIP (WITH OR WITHOUT PELVIS) 2-3V RIGHT

[pelvis ap]
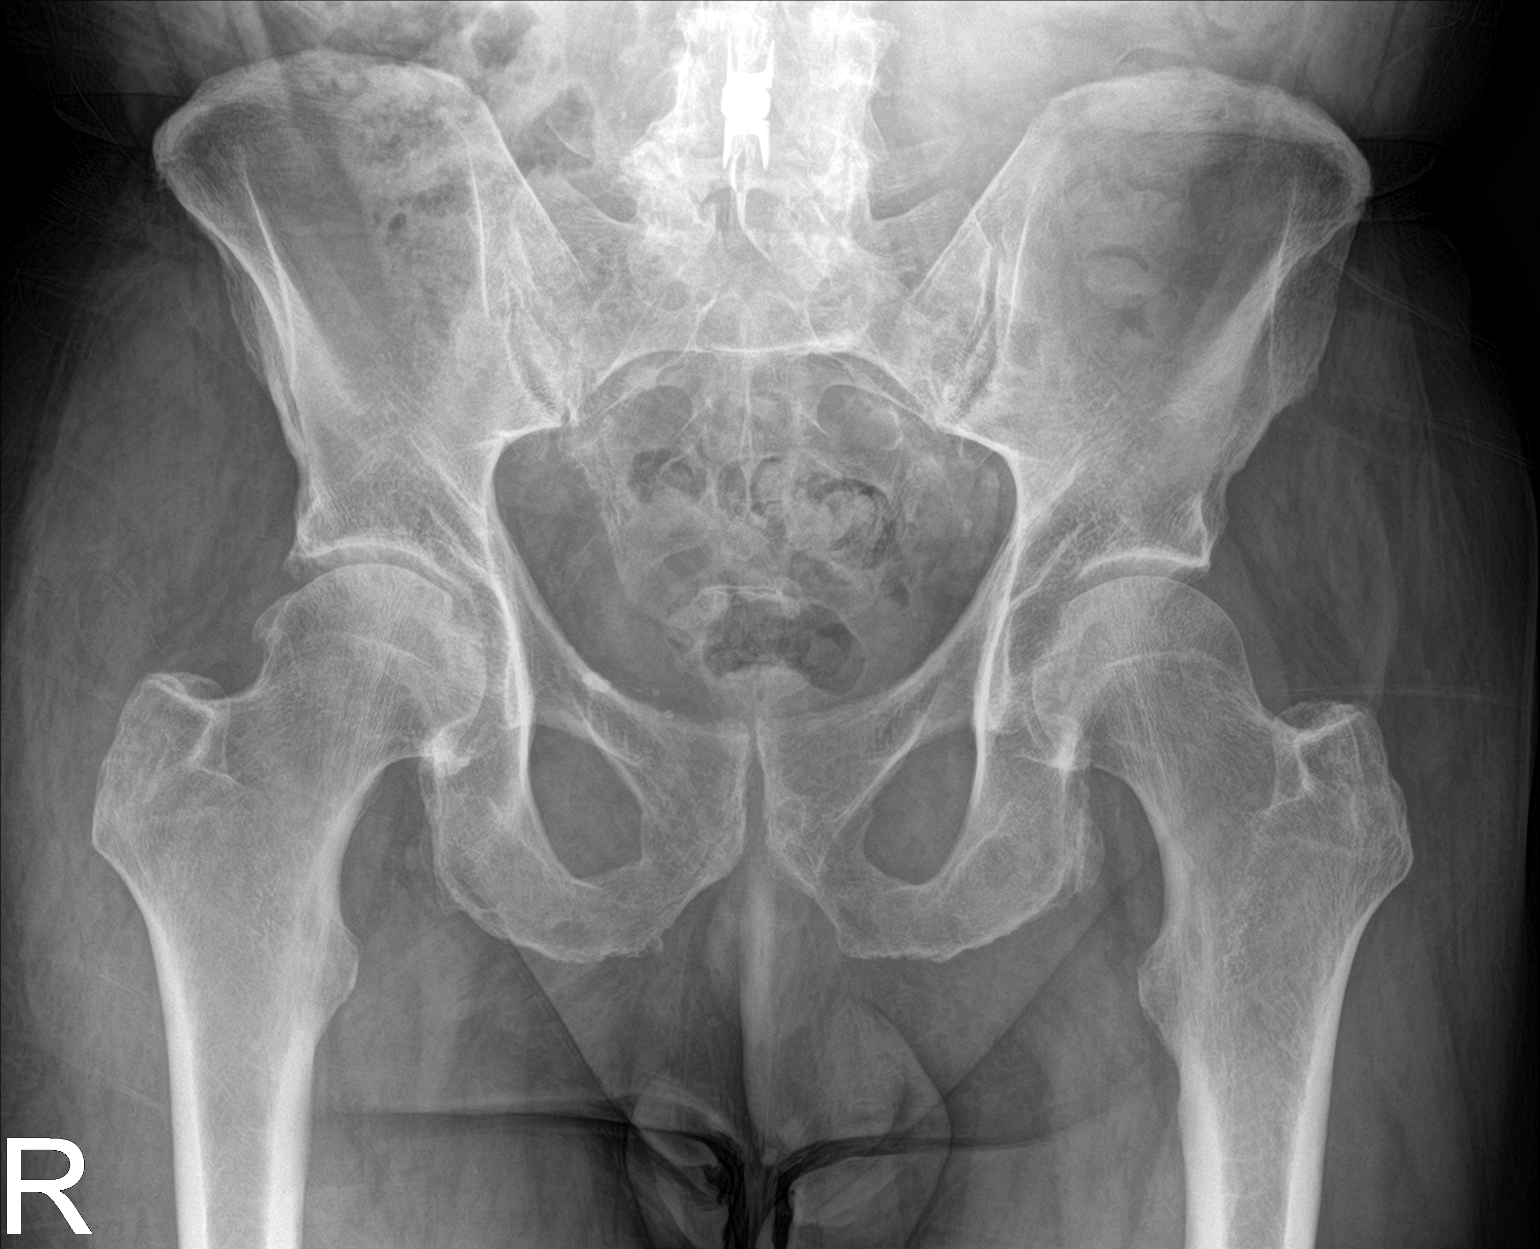

[hip ap]
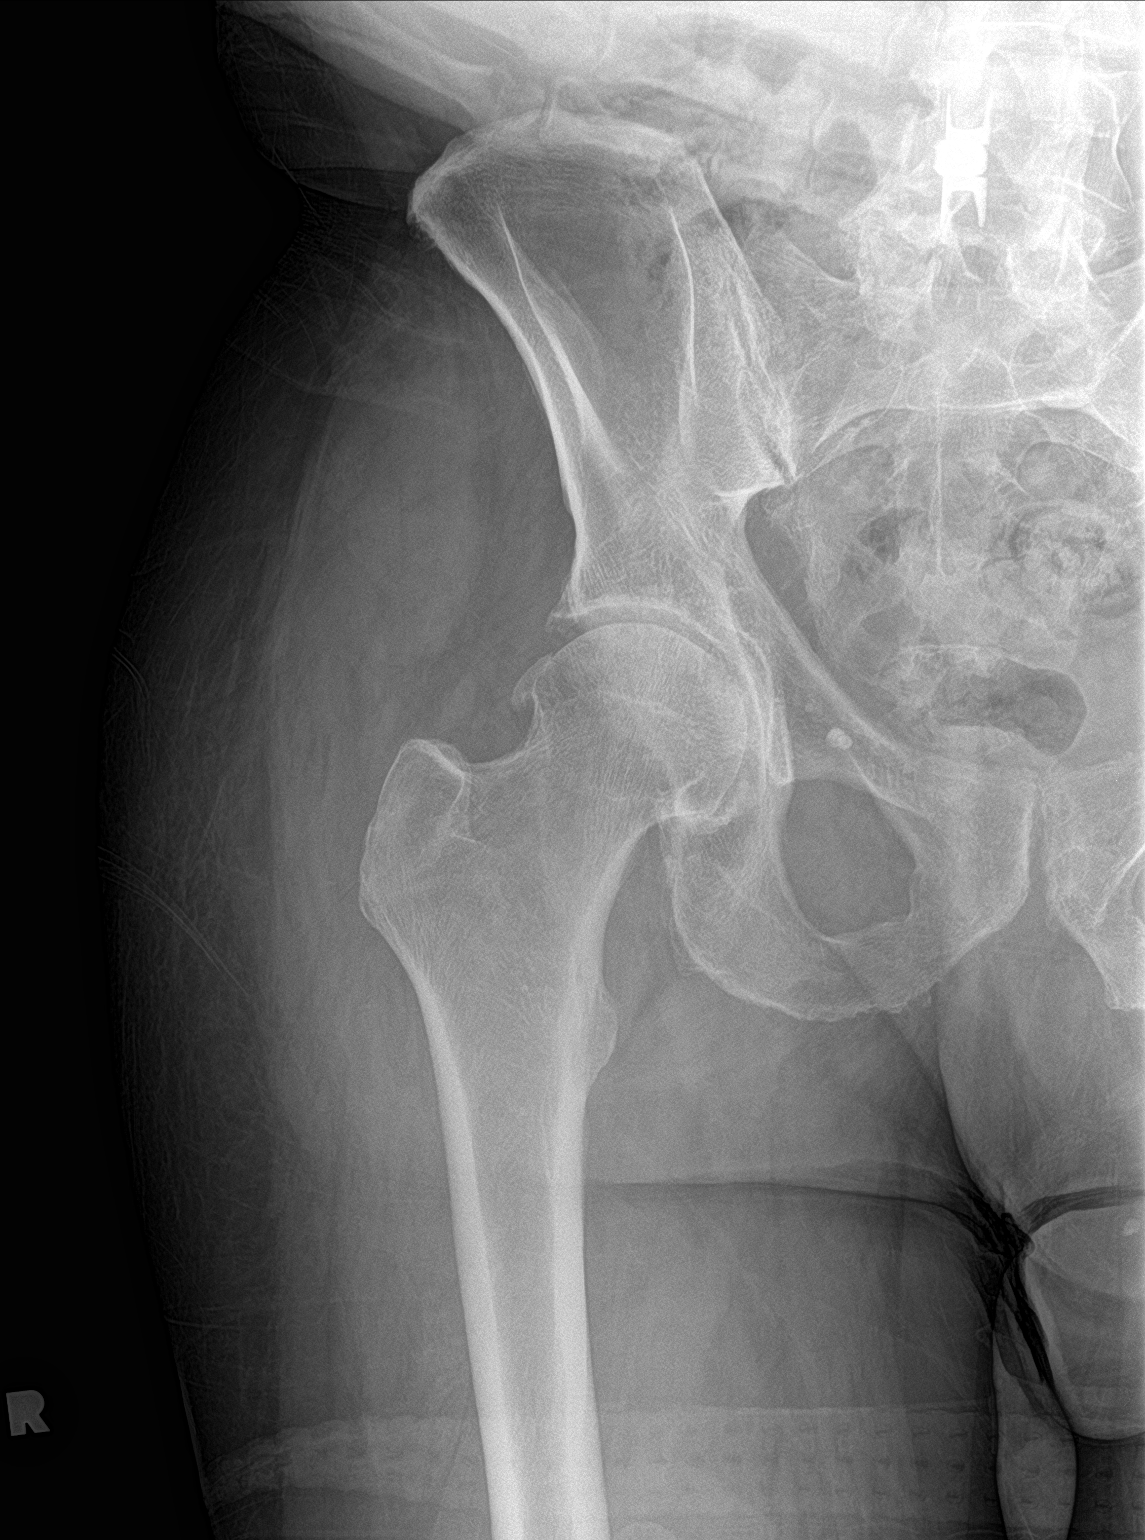

[hip x-table]
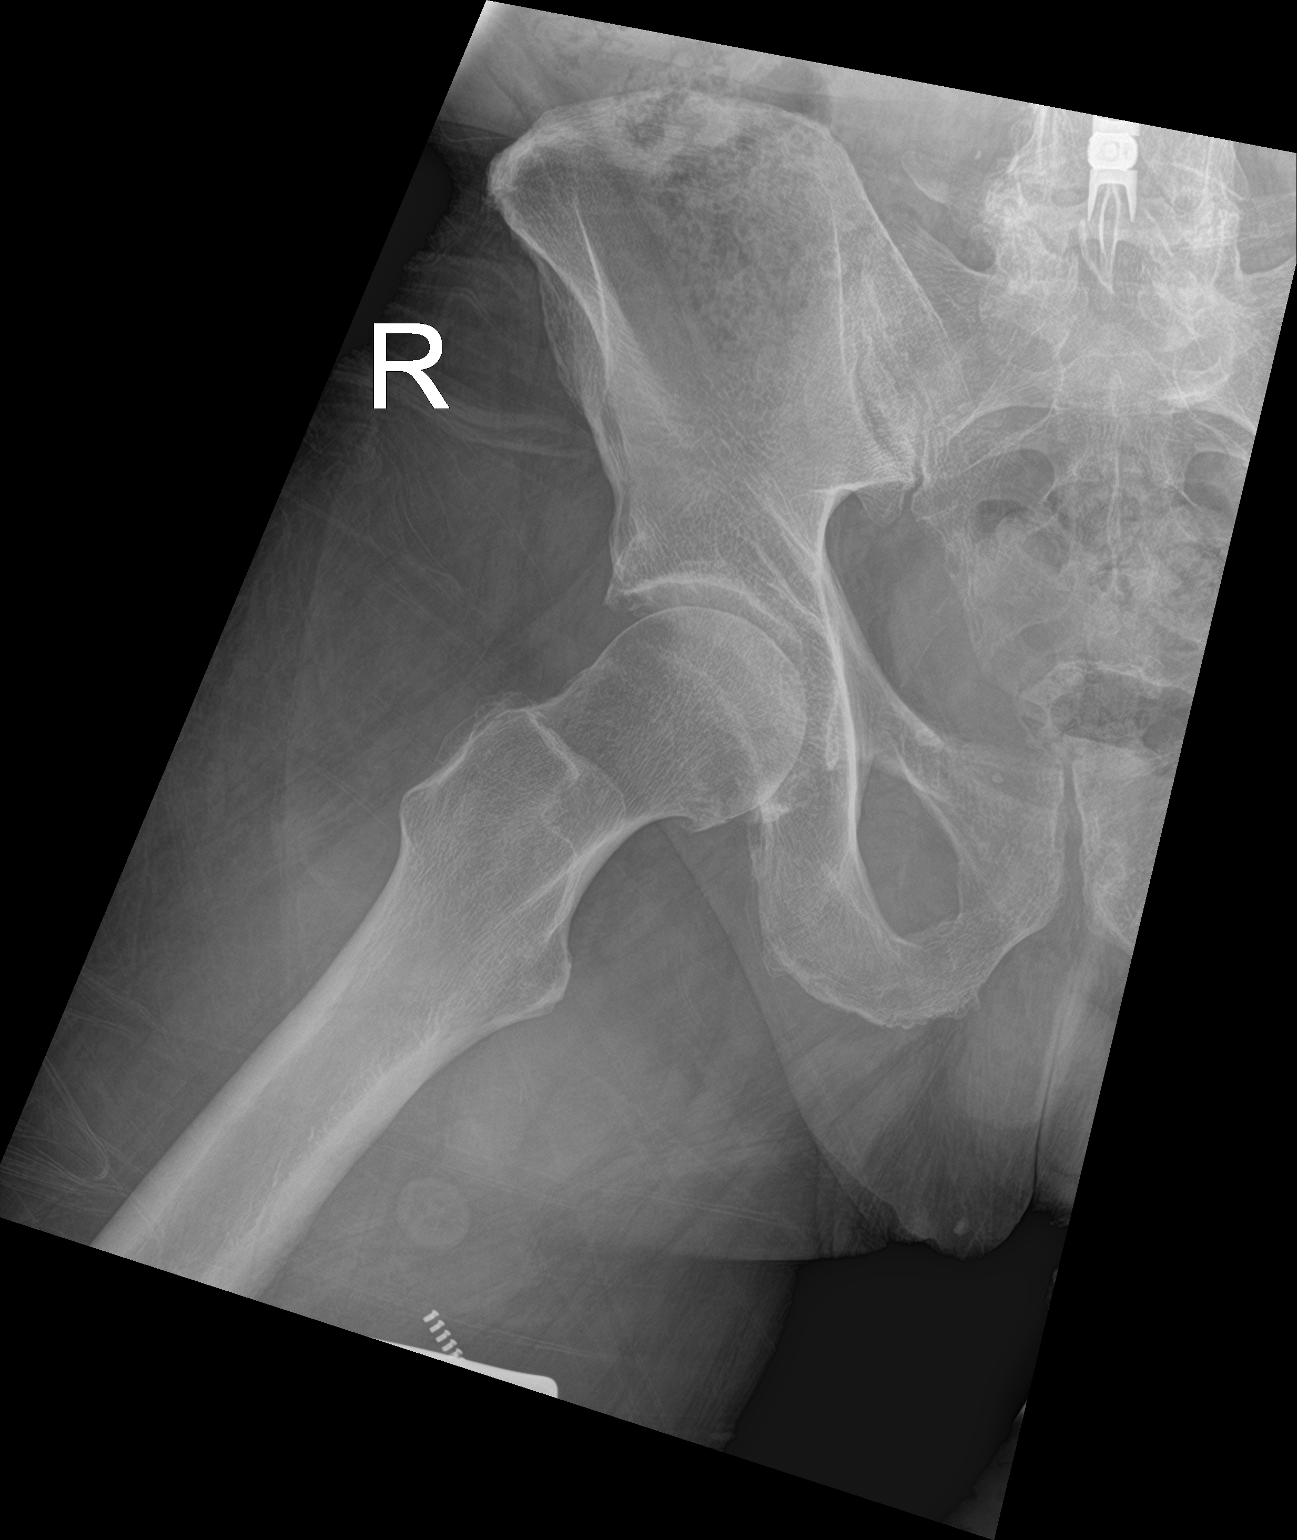

[3 of 3 positions shown; findings below may reference images not displayed]

FINDINGS: There is no evidence of hip fracture or dislocation. Mild
degenerative spurring of the right femoral head is seen, without
evidence of joint space narrowing. No other focal bone abnormality.
IMPRESSION: No acute findings.

Mild right hip osteoarthritis.

## 2020-09-12 MED ORDER — METHOCARBAMOL 500 MG PO TABS
500.0000 mg | ORAL_TABLET | Freq: Once | ORAL | Status: AC
  Administered 2020-09-12: 500 mg via ORAL
  Filled 2020-09-12: qty 1

## 2020-09-12 MED ORDER — ACETAMINOPHEN 500 MG PO TABS
1000.0000 mg | ORAL_TABLET | Freq: Once | ORAL | Status: AC
  Administered 2020-09-12: 1000 mg via ORAL
  Filled 2020-09-12: qty 2

## 2020-09-12 MED ORDER — LIDOCAINE 5 % EX PTCH
1.0000 | MEDICATED_PATCH | CUTANEOUS | Status: DC
  Administered 2020-09-12: 1 via TRANSDERMAL
  Filled 2020-09-12: qty 1

## 2020-09-12 MED ORDER — METHOCARBAMOL 500 MG PO TABS: 500.0000 mg | ORAL_TABLET | Freq: Two times a day (BID) | ORAL | 0 refills | Status: DC | PRN

## 2020-09-12 MED ORDER — LIDOCAINE 5 % EX PTCH: 1.0000 | MEDICATED_PATCH | CUTANEOUS | 0 refills | Status: DC

## 2020-09-12 NOTE — ED Provider Notes (Signed)
[facility] EMERGENCY DEPARTMENT Provider Note   CSN: [id number] Arrival date & time: 09/12/20  7253     History No chief complaint on file.   Brandon Mendoza is a 80 y.o. male presenting for evaluation of R hip thigh pain.   Patient states he fell 5 days ago, landing on his right side.  He did not hit his head or lose consciousness.  He had no pain at that time.  He has a history of frequent falls due to disequilibrium.  This is being evaluated and managed by his PCP and neurologist.  2 days ago he had a spinal injection with pain management, as scheduled.  Yesterday he did his normal workout at the gym, including several mile bike ride as well as lifting weights.  Later that evening he developed right hip pain and noticed bruising in that area.  His pain is severe with movement and ambulation, however he has no pain at rest.  He has not taken anything for it.  He is on Eliquis.  He denies radiation of the pain.  No associated numbness or tingling.  No pain on the left side.  Additional history taken chart reviewed.  Patient with history of anxiety, COPD, depression, hypertension, hyperlipidemia, PVD, A. 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   History of carotid endarterectomy 04/10/2014   Essential hypertension 04/10/2014   Aftercare following surgery of the circulatory system, NEC 03/13/2013   Numbness- Left neck 03/13/2013   Occlusion and stenosis  of carotid artery without mention of cerebral infarction 12/15/2011   HTN (hypertension), malignant 12/01/2011   Habitual alcohol use 12/01/2011   Carotid artery stenosis, symptomatic 11/30/2011   CVA (cerebral infarction) 11/29/2011   Hyperlipidemia     Past Surgical History:  Procedure Laterality Date   CAROTID ENDARTERECTOMY  12/09/11   Left cea   ENDARTERECTOMY  12/09/2011   Procedure: ENDARTERECTOMY CAROTID;  Surgeon: Larina Earthly, MD;  Location: [facility] OR;  Service: Vascular;  Laterality: Left;   EYE SURGERY     rt retina detachment   HAMMER TOE SURGERY  2004   HERNIA REPAIR  2006   SHOULDER ARTHROSCOPY W/ ROTATOR CUFF REPAIR  2010   TONSILLECTOMY         Family History  Problem Relation Age of Onset   Alzheimer's disease Father    Diabetes Other     Social History   Tobacco Use   Smoking status: Former    Packs/day: 1.00    Years: 50.00    Pack years: 50.00    Types: Cigarettes    Quit date: 08/26/2004    Years since quitting: 16.0   Smokeless tobacco: Never   Tobacco comments:    quit smoking 08/2004  Substance Use Topics   Alcohol use: Yes    Alcohol/week: 14.0 standard  drinks    Types: 14 Shots of liquor per week    Comment: 2-3 daily cocktails   Drug use: No    Home Medications Prior to Admission medications   Medication Sig Start Date End Date Taking? Authorizing Provider  acetaminophen (TYLENOL) 500 MG tablet Take 1,000 mg by mouth every 6 (six) hours as needed.   Yes [provider]  ALPRAZolam Prudy Feeler) 0.5 MG tablet Take 0.5 mg by mouth at bedtime as needed. For anxiety   Yes [provider]  apixaban (ELIQUIS) 5 MG TABS tablet Take 5 mg by mouth 2 (two) times daily. Bid   Yes [provider]  Ascorbic Acid (VITAMIN C PO) Take 2 tablets by mouth daily.   Yes [provider]  flecainide (TAMBOCOR) 100 MG tablet Take 100 mg by mouth 2 (two) times daily. 06/16/20  Yes [provider]  lidocaine (LIDODERM) 5 %  Place 1 patch onto the skin daily. Remove & Discard patch within 12 hours or as directed by MD 09/12/20  Yes Wai Minotti, PA-C  lisinopril (PRINIVIL,ZESTRIL) 10 MG tablet Take 10 mg by mouth daily.   Yes [provider]  methocarbamol (ROBAXIN) 500 MG tablet Take 1 tablet (500 mg total) by mouth 2 (two) times daily as needed for muscle spasms. 09/12/20  Yes Taima Rada, PA-C  Multiple Vitamins-Minerals (MULTIVITAMINS THER. W/MINERALS) TABS Take 1 tablet by mouth daily.   Yes [provider]  Omega-3 Fatty Acids (FISH OIL) 1000 MG CAPS Take 1,000 mg by mouth daily.   Yes [provider]  Rosuvastatin Calcium (CRESTOR PO) Take 10 mg by mouth daily.   Yes [provider]  sertraline (ZOLOFT) 100 MG tablet Take 100 mg by mouth daily. 06/16/20  Yes [provider]  sertraline (ZOLOFT) 50 MG tablet TAKE ONE TABLET BY MOUTH ONCE DAILY Patient not taking: Reported on 09/12/2020 08/28/15   Marvel Plan, MD    Allergies    Patient has no known allergies.  Review of Systems   Review of Systems  Musculoskeletal:  Positive for myalgias.  Skin:  Positive for color change.  Hematological:  Bruises/bleeds easily.  All other systems reviewed and are negative.  Physical Exam Updated Vital Signs BP 120/61   Pulse 64   Temp 98.2 F (36.8 C) (Oral)   Resp 16   SpO2 96%   Physical Exam Vitals and nursing note reviewed.  Constitutional:      General: He is not in acute distress.    Appearance: Normal appearance.     Comments: Resting in the bed in NAD  HENT:     Head: Normocephalic and atraumatic.  Eyes:     Conjunctiva/sclera: Conjunctivae normal.     Pupils: Pupils are equal, round, and reactive to light.  Cardiovascular:     Rate and Rhythm: Normal rate and regular rhythm.     Pulses: Normal pulses.  Pulmonary:     Effort: Pulmonary effort is normal. No respiratory distress.     Breath sounds: Normal breath sounds. No wheezing.      Comments: Speaking in full sentences.  Clear lung sounds in all fields. Abdominal:     General: There is no distension.     Palpations: Abdomen is soft.     Tenderness: There is no abdominal tenderness.  Musculoskeletal:        General: Swelling and tenderness present.     Cervical back: Normal range of motion and neck supple.     Comments: Contusion  noted of the right lateral hip, yellowing on the edges and appears old.  Tenderness palpation of the proximal lateral right thigh without erythema, warmth, or redness.  No chest palpation of the calf.  Pedal pulses 2+ bilaterally.  Patient able to flex and extend at the knee and hips with discomfort.  No pain with passive hip flexion and abduction. Increased pain with passive hip adduction and internal rotation.   Skin:    General: Skin is warm and dry.     Capillary Refill: Capillary refill takes less than 2 seconds.  Neurological:     Mental Status: He is alert and oriented to person, place, and time.  Psychiatric:        Mood and Affect: Mood and affect normal.        Speech: Speech normal.        Behavior: Behavior normal.    ED Results / Procedures / Treatments   Labs (all labs ordered are listed, but only abnormal results are displayed) Labs Reviewed - No data to display  EKG None  Radiology DG Hip Unilat With Pelvis 2-3 Views Right  Result Date: 09/12/2020 CLINICAL DATA:  Fall.  Right hip pain. EXAM: DG HIP (WITH OR WITHOUT PELVIS) 2-3V RIGHT COMPARISON:  None. FINDINGS: There is no evidence of hip fracture or dislocation. Mild degenerative spurring of the right femoral head is seen, without evidence of joint space narrowing. No other focal bone abnormality. IMPRESSION: No acute findings. Mild right hip osteoarthritis. Electronically Signed   By: Danae Orleans M.D.   On: 09/12/2020 09:05    Procedures Procedures   Medications Ordered in ED Medications  lidocaine (LIDODERM) 5 % 1 patch (1 patch Transdermal Patch Applied 09/12/20  1443)  methocarbamol (ROBAXIN) tablet 500 mg (500 mg Oral Given 09/12/20 1442)  acetaminophen (TYLENOL) tablet 1,000 mg (1,000 mg Oral Given 09/12/20 1442)    ED Course  I have reviewed the triage vital signs and the nursing notes.  Pertinent labs & imaging results that were available during my care of the patient were reviewed by me and considered in my medical decision making (see chart for details).    MDM Rules/Calculators/A&P                          Pt presenting for evaluation of R hip pain. On exam, pt appears nontoxic. Neurovascularly intact. Xray obtained from triage viewed and independently interpreted by me, no fx or dislocation. Pain is worse with active movement and passive adduction and internal rotation. Likely muscular in the setting of an intense workout yesterday. Will tx symptomatically, and ensure pt is able to ambulate.   Pt signed out to Dr. Wynona Neat for dispo after ambulation.    Final Clinical Impression(s) / ED Diagnoses Final diagnoses:  Right hip pain    Rx / DC Orders ED Discharge Orders          Ordered    methocarbamol (ROBAXIN) 500 MG tablet  2 times daily PRN        09/12/20 1526    lidocaine (LIDODERM) 5 %  Every 24 hours        09/12/20 1526             [place], [place], PA-C 09/12/20 1534    Long, Arlyss Repress, MD 09/13/20 1104

## 2020-09-12 NOTE — Discharge Instructions (Addendum)
Continue taking home medications as prescribed.  Use robaxin as needed for muscle pain.  Use lidoderm and tylenol as needed for further pain control.  Use ice to help with pain control.  Follow up with your orthopedic doctor or with the on call orthopedic doctor for further evaluation.  Return to the ER with any new, worsening, or concerning symptoms.

## 2020-09-12 NOTE — ED Provider Notes (Signed)
Emergency Medicine Provider Triage Evaluation Note  Brandon Mendoza , a 80 y.o. male  was evaluated in triage.  Pt complains of gradual onset, constant, achy, R hip pain s/p ground level fall that occurred 5 days ago. NO pain after the fall; only started a few days later. Unable to bare weight due to significant amount of pain. NO head injury or LOC.   Review of Systems  Positive: + R hip pain Negative: - back pain, weakness/numbness  Physical Exam  BP (!) 109/56 (BP Location: Left Arm)   Pulse 72   Temp 98 F (36.7 C) (Oral)   Resp 16   SpO2 95%  Gen:   Awake, no distress   Resp:  Normal effort  MSK:   Moves extremities without difficulty  Other:  Hematoma and ecchymosis noted to R lateral hip with TTP. ROM intact.   Medical Decision Making  Medically screening exam initiated at 8:11 AM.  Appropriate orders placed.  Brandon Mendoza was informed that the remainder of the evaluation will be completed by another provider, this initial triage assessment does not replace that evaluation, and the importance of remaining in the ED until their evaluation is complete.     Tanda Rockers, PA-C 09/12/20 0815    Maia Plan, MD 09/13/20 1104

## 2020-09-12 NOTE — ED Notes (Signed)
RN reviewed discharge instructions w/ pt. Follow up, pain management, and prescriptions reviewed. Pt had no further questions 

## 2020-09-12 NOTE — ED Triage Notes (Signed)
Patient complains of right hip pain with hematoma since fall on Tuesday-had injection to back and on eliquis. Noticed hematoma to right hip after going to gym on Friday

## 2020-09-14 ENCOUNTER — Other Ambulatory Visit: Payer: Self-pay

## 2020-09-14 ENCOUNTER — Emergency Department (HOSPITAL_BASED_OUTPATIENT_CLINIC_OR_DEPARTMENT_OTHER): Payer: Medicare Other

## 2020-09-14 ENCOUNTER — Encounter (HOSPITAL_BASED_OUTPATIENT_CLINIC_OR_DEPARTMENT_OTHER): Payer: Self-pay | Admitting: Emergency Medicine

## 2020-09-14 ENCOUNTER — Inpatient Hospital Stay (HOSPITAL_BASED_OUTPATIENT_CLINIC_OR_DEPARTMENT_OTHER)
Admission: EM | Admit: 2020-09-14 | Discharge: 2020-09-17 | DRG: 605 | Disposition: A | Discharge status: Skilled Nursing Facility | Payer: Medicare Other | Attending: Family Medicine | Admitting: Family Medicine

## 2020-09-14 DIAGNOSIS — D62 Acute posthemorrhagic anemia: Secondary | ICD-10-CM | POA: Diagnosis present

## 2020-09-14 DIAGNOSIS — I10 Essential (primary) hypertension: Secondary | ICD-10-CM | POA: Diagnosis present

## 2020-09-14 DIAGNOSIS — F1010 Alcohol abuse, uncomplicated: Secondary | ICD-10-CM | POA: Diagnosis present

## 2020-09-14 DIAGNOSIS — Z9989 Dependence on other enabling machines and devices: Secondary | ICD-10-CM | POA: Diagnosis not present

## 2020-09-14 DIAGNOSIS — I480 Paroxysmal atrial fibrillation: Secondary | ICD-10-CM | POA: Diagnosis present

## 2020-09-14 DIAGNOSIS — S7011XA Contusion of right thigh, initial encounter: Secondary | ICD-10-CM | POA: Diagnosis present

## 2020-09-14 DIAGNOSIS — I4892 Unspecified atrial flutter: Secondary | ICD-10-CM | POA: Diagnosis present

## 2020-09-14 DIAGNOSIS — S300XXA Contusion of lower back and pelvis, initial encounter: Secondary | ICD-10-CM | POA: Diagnosis present

## 2020-09-14 DIAGNOSIS — N179 Acute kidney failure, unspecified: Secondary | ICD-10-CM | POA: Diagnosis present

## 2020-09-14 DIAGNOSIS — Z87891 Personal history of nicotine dependence: Secondary | ICD-10-CM

## 2020-09-14 DIAGNOSIS — M4800 Spinal stenosis, site unspecified: Secondary | ICD-10-CM | POA: Diagnosis present

## 2020-09-14 DIAGNOSIS — Z79899 Other long term (current) drug therapy: Secondary | ICD-10-CM

## 2020-09-14 DIAGNOSIS — E871 Hypo-osmolality and hyponatremia: Secondary | ICD-10-CM | POA: Diagnosis present

## 2020-09-14 DIAGNOSIS — Z9181 History of falling: Secondary | ICD-10-CM

## 2020-09-14 DIAGNOSIS — I739 Peripheral vascular disease, unspecified: Secondary | ICD-10-CM | POA: Diagnosis present

## 2020-09-14 DIAGNOSIS — F32A Depression, unspecified: Secondary | ICD-10-CM | POA: Diagnosis present

## 2020-09-14 DIAGNOSIS — E785 Hyperlipidemia, unspecified: Secondary | ICD-10-CM | POA: Diagnosis present

## 2020-09-14 DIAGNOSIS — T148XXA Other injury of unspecified body region, initial encounter: Secondary | ICD-10-CM | POA: Diagnosis present

## 2020-09-14 DIAGNOSIS — D72829 Elevated white blood cell count, unspecified: Secondary | ICD-10-CM | POA: Diagnosis present

## 2020-09-14 DIAGNOSIS — J449 Chronic obstructive pulmonary disease, unspecified: Secondary | ICD-10-CM | POA: Diagnosis present

## 2020-09-14 DIAGNOSIS — Z7901 Long term (current) use of anticoagulants: Secondary | ICD-10-CM | POA: Diagnosis not present

## 2020-09-14 DIAGNOSIS — Z8673 Personal history of transient ischemic attack (TIA), and cerebral infarction without residual deficits: Secondary | ICD-10-CM

## 2020-09-14 DIAGNOSIS — G4739 Other sleep apnea: Secondary | ICD-10-CM

## 2020-09-14 DIAGNOSIS — S7010XA Contusion of unspecified thigh, initial encounter: Secondary | ICD-10-CM | POA: Diagnosis present

## 2020-09-14 DIAGNOSIS — D649 Anemia, unspecified: Secondary | ICD-10-CM | POA: Diagnosis present

## 2020-09-14 DIAGNOSIS — M21371 Foot drop, right foot: Secondary | ICD-10-CM | POA: Diagnosis present

## 2020-09-14 DIAGNOSIS — T80818A Extravasation of other vesicant agent, initial encounter: Secondary | ICD-10-CM | POA: Diagnosis not present

## 2020-09-14 DIAGNOSIS — W010XXA Fall on same level from slipping, tripping and stumbling without subsequent striking against object, initial encounter: Secondary | ICD-10-CM | POA: Diagnosis present

## 2020-09-14 DIAGNOSIS — G629 Polyneuropathy, unspecified: Secondary | ICD-10-CM | POA: Diagnosis present

## 2020-09-14 DIAGNOSIS — G4733 Obstructive sleep apnea (adult) (pediatric): Secondary | ICD-10-CM | POA: Diagnosis present

## 2020-09-14 DIAGNOSIS — M25361 Other instability, right knee: Secondary | ICD-10-CM | POA: Diagnosis present

## 2020-09-14 DIAGNOSIS — F419 Anxiety disorder, unspecified: Secondary | ICD-10-CM | POA: Diagnosis present

## 2020-09-14 DIAGNOSIS — Z20822 Contact with and (suspected) exposure to covid-19: Secondary | ICD-10-CM | POA: Diagnosis present

## 2020-09-14 DIAGNOSIS — Z86010 Personal history of colonic polyps: Secondary | ICD-10-CM

## 2020-09-14 DIAGNOSIS — M25551 Pain in right hip: Secondary | ICD-10-CM | POA: Diagnosis present

## 2020-09-14 DIAGNOSIS — D500 Iron deficiency anemia secondary to blood loss (chronic): Secondary | ICD-10-CM

## 2020-09-14 LAB — CBC WITH DIFFERENTIAL/PLATELET
Abs Immature Granulocytes: 0.12 10*3/uL — ABNORMAL HIGH (ref 0.00–0.07)
Basophils Absolute: 0.1 10*3/uL (ref 0.0–0.1)
Basophils Relative: 1 %
Eosinophils Absolute: 0.2 10*3/uL (ref 0.0–0.5)
Eosinophils Relative: 1 %
HCT: 29.4 % — ABNORMAL LOW (ref 39.0–52.0)
Hemoglobin: 10.4 g/dL — ABNORMAL LOW (ref 13.0–17.0)
Immature Granulocytes: 1 %
Lymphocytes Relative: 11 %
Lymphs Abs: 1.5 10*3/uL (ref 0.7–4.0)
MCH: 31.5 pg (ref 26.0–34.0)
MCHC: 35.4 g/dL (ref 30.0–36.0)
MCV: 89.1 fL (ref 80.0–100.0)
Monocytes Absolute: 1.6 10*3/uL — ABNORMAL HIGH (ref 0.1–1.0)
Monocytes Relative: 12 %
Neutro Abs: 9.8 10*3/uL — ABNORMAL HIGH (ref 1.7–7.7)
Neutrophils Relative %: 74 %
Platelets: 279 10*3/uL (ref 150–400)
RBC: 3.3 MIL/uL — ABNORMAL LOW (ref 4.22–5.81)
RDW: 12.4 % (ref 11.5–15.5)
WBC: 13.3 10*3/uL — ABNORMAL HIGH (ref 4.0–10.5)
nRBC: 0 % (ref 0.0–0.2)

## 2020-09-14 LAB — BASIC METABOLIC PANEL
Anion gap: 11 (ref 5–15)
BUN: 45 mg/dL — ABNORMAL HIGH (ref 8–23)
CO2: 21 mmol/L — ABNORMAL LOW (ref 22–32)
Calcium: 8.6 mg/dL — ABNORMAL LOW (ref 8.9–10.3)
Chloride: 100 mmol/L (ref 98–111)
Creatinine, Ser: 1.83 mg/dL — ABNORMAL HIGH (ref 0.61–1.24)
GFR, Estimated: 37 mL/min — ABNORMAL LOW (ref >60)
Glucose, Bld: 185 mg/dL — ABNORMAL HIGH (ref 70–99)
Potassium: 4.5 mmol/L (ref 3.5–5.1)
Sodium: 132 mmol/L — ABNORMAL LOW (ref 135–145)

## 2020-09-14 LAB — HEPARIN LEVEL (UNFRACTIONATED): Heparin Unfractionated: >1.1 IU/mL — ABNORMAL HIGH (ref 0.30–0.70)

## 2020-09-14 LAB — RESP PANEL BY RT-PCR (FLU A&B, COVID) ARPGX2
Influenza A by PCR: NEGATIVE
Influenza B by PCR: NEGATIVE
SARS Coronavirus 2 by RT PCR: NEGATIVE

## 2020-09-14 LAB — CK: Total CK: 30 U/L — ABNORMAL LOW (ref 49–397)

## 2020-09-14 LAB — HEMOGLOBIN AND HEMATOCRIT, BLOOD
HCT: 28.3 % — ABNORMAL LOW (ref 39.0–52.0)
Hemoglobin: 9.6 g/dL — ABNORMAL LOW (ref 13.0–17.0)

## 2020-09-14 IMAGING — CT CT FEMUR *R* WO/W CM
3 of 5 series · 9 of 33 positions shown, 10 images · IV contrast (omnipaque)
Comparison: Right hip series [DATE].

CLINICAL DATA: 80-year-old male with large hematoma after fall 6
days ago. Unrevealing hip radiograph, AMAZIGH lesion.

EXAM:
CT OF THE LOWER RIGHT EXTREMITY WITHOUT CONTRAST
TECHNIQUE: Multidetector CT imaging of the lower right extremity was performed
following the standard protocol before and during bolus
administration of intravenous contrast.
CONTRAST:  75mL OMNIPAQUE IOHEXOL 300 MG/ML  SOLN

[Series 6: coronal st · coronal · 0.49mm/px · 1 of 127 slices shown]
[im 64/127  bone]
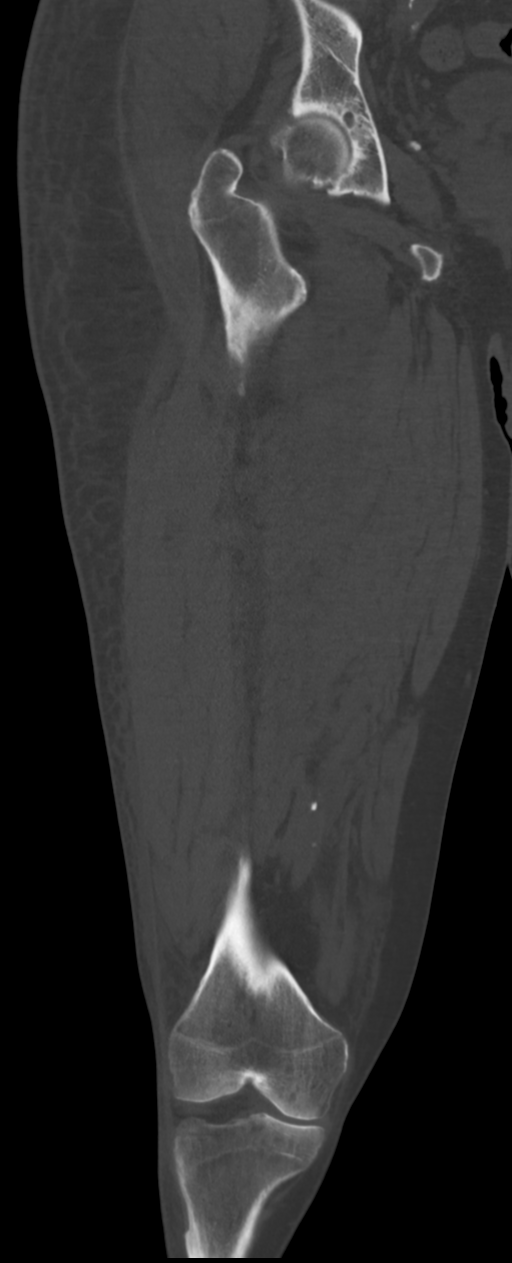

[Series 10: axial soft · axial · 0.59mm/px · z∈[-745,-369]mm · 3 of 611 slices shown, 4 images]
[im 141/611  soft-tissue]
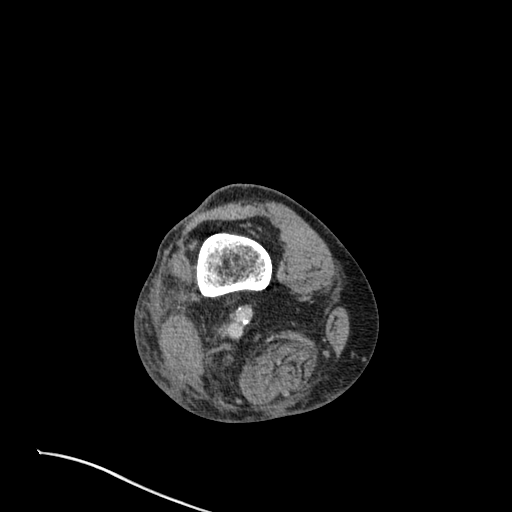
[im 141/611  bone]
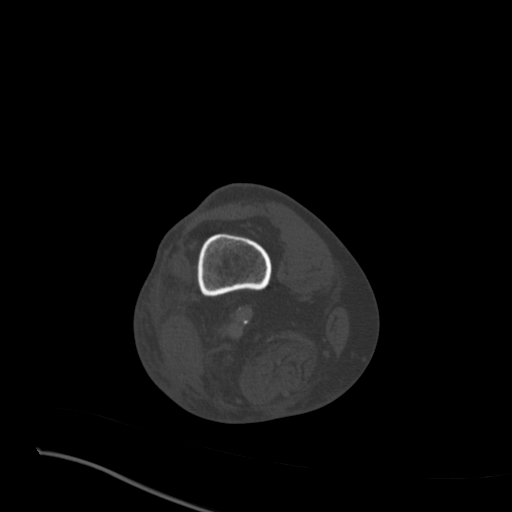
[im 329/611  bone]
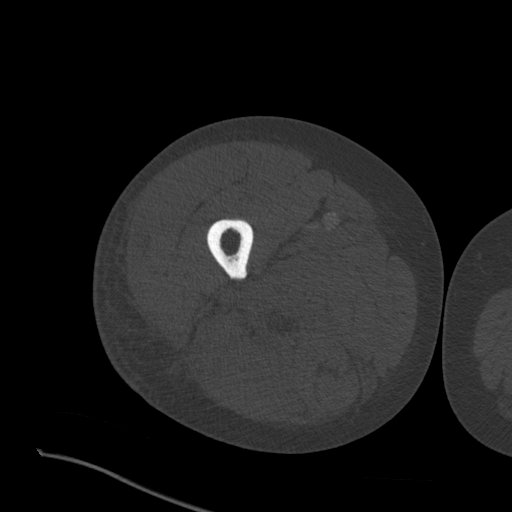
[im 517/611  bone]
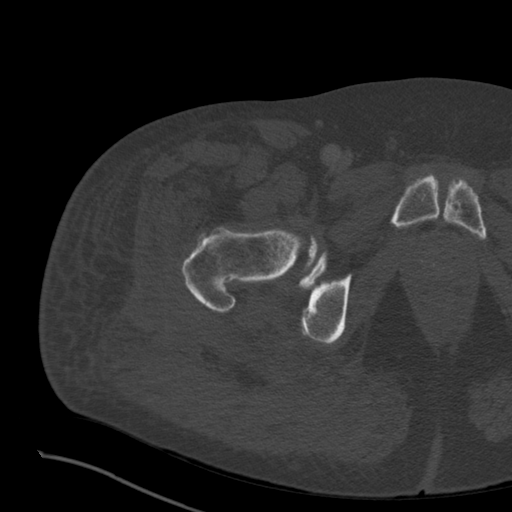

[Series 14: sagittal st · sagittal · 0.51mm/px · 5 of 83 slices shown]
[im 14/83  bone]
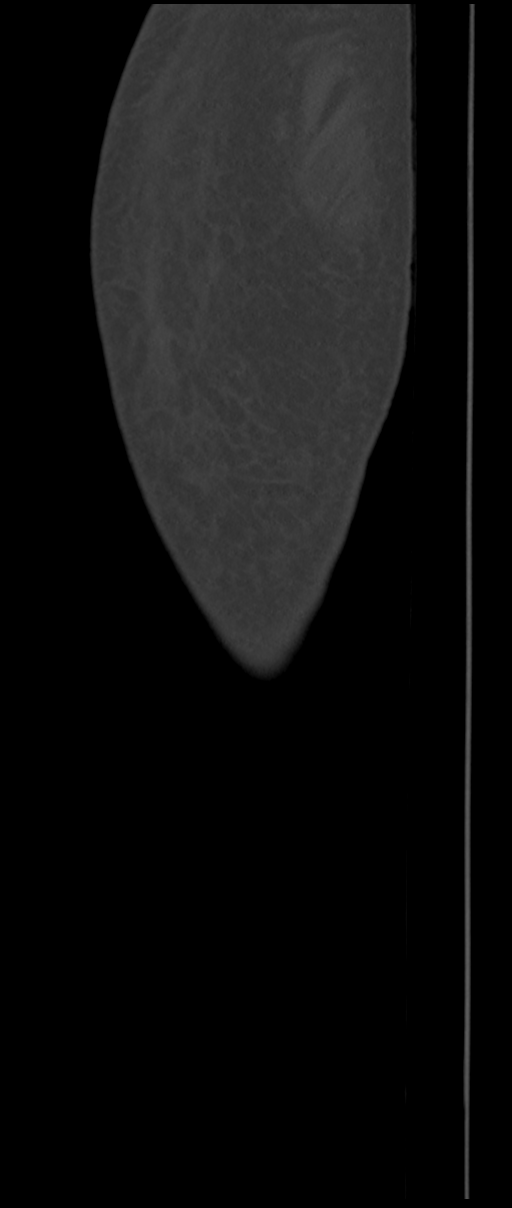
[im 28/83  bone]
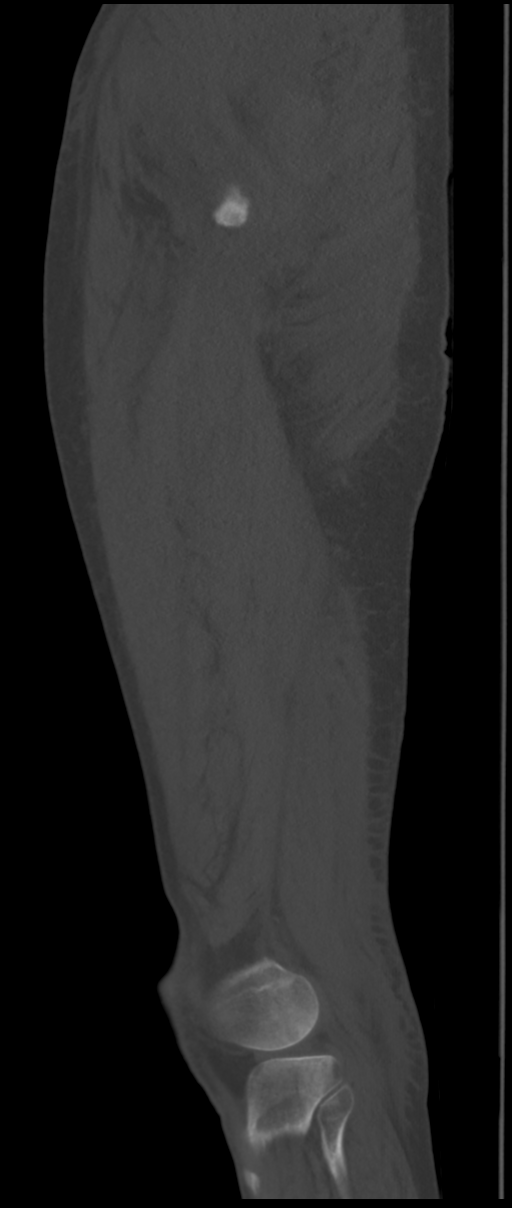
[im 42/83  bone]
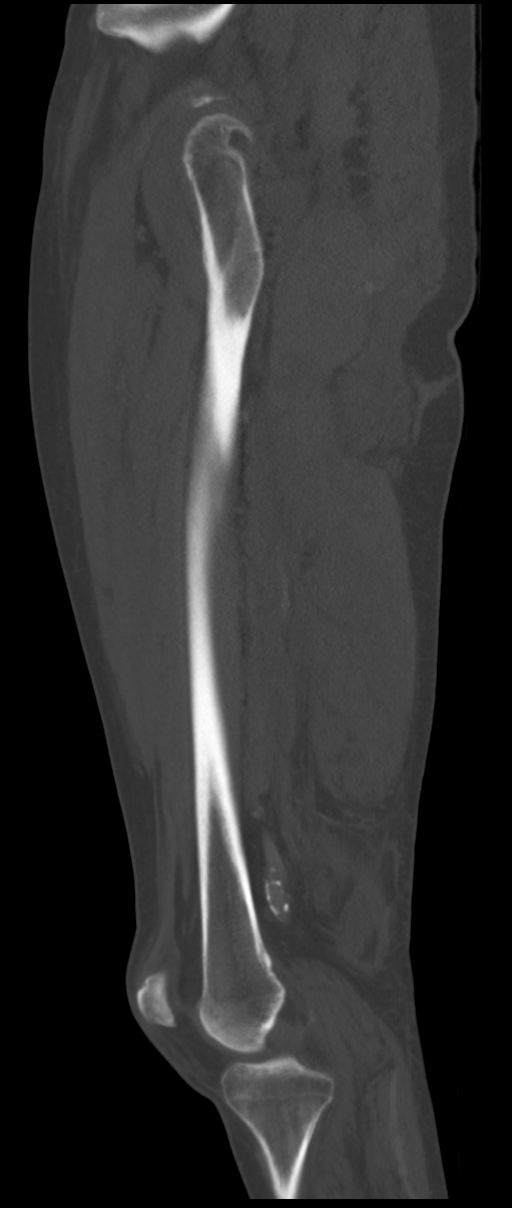
[im 55/83  bone]
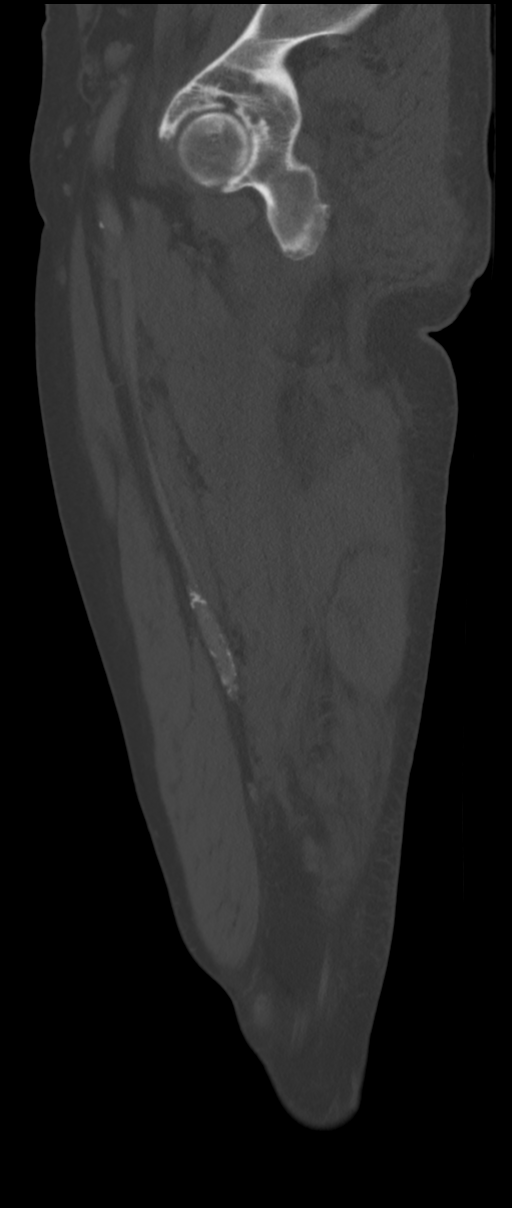
[im 69/83  bone]
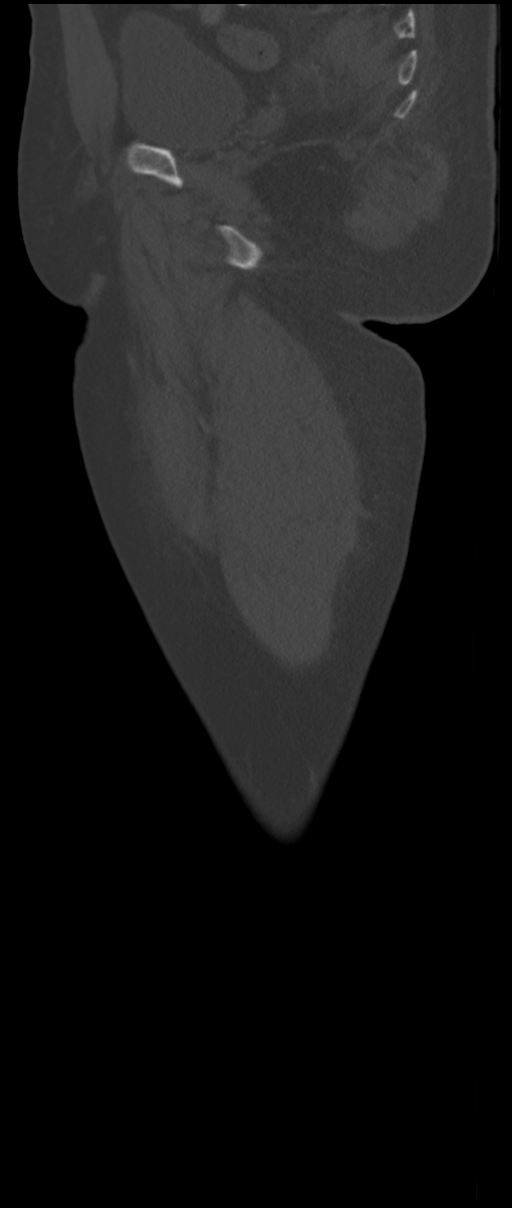

[9 of 33 positions shown; findings below may reference images not displayed]

FINDINGS: Visible pelvis demonstrates iliac artery atherosclerosis. Calcified
peripheral vascular disease but the major arterial structures in the
visible pelvis and the visible right lower extremity appear to be
patent. The major venous structures in the distal right lower
extremity are enhancing at the time of imaging and appear to be
patent.

Negative urinary bladder. Negative visible bowel in the pelvis.
There is presacral stranding in the pelvis, but no free fluid.

Bone windows demonstrate no right hemipelvis fracture. The proximal
right femur appears intact. The right femoral shaft and visible
distal femur, patella, proximal tibia and fibula appear intact. No
fracture identified. No knee joint effusion.

Soft tissue windows demonstrate widespread lateral subcutaneous soft
tissue stranding beginning at the flank and continuing distally in
the leg. The subcutaneous stranding becomes posterior near the knee.
Posterior to the proximal femur beginning just below the level of
the lesser trochanter there is an oval, bilobed heterogeneous
intramuscular lesion (series 10, image 145 and series 7, image 46
extending about 14 cm in length which most resembles a large
intramuscular hematoma, and AMAZIGH could be a small focus of
contrast extravasation on series 10, image 146. Some lobular
extension of this lesion toward the superficial fossa is identified
(series 7, image 31), but there is no solid or cystic mass
superficial to the deep fascia to suggest AMAZIGH lesion. All
told, this lesion encompasses about 52 x 68 by 143 mm (250 mL
estimated volume) furthermore, there is also some gluteal and other
regional intramuscular hematoma suspected just below the pelvis such
as on series 10, images 32 and 65.

No soft tissue gas. No intramuscular hematoma identified more
distally.
IMPRESSION: 1. No cystic or solid mass superficial to the deep fascia to suggest
AMAZIGH lesion, but rather multifocal intramuscular hematoma
is suspected from the right buttocks region tracking posteriorly -
including a large roughly 250 mL hematoma in the musculature
posterior to the femoral shaft beginning just below the lesser
trochanter. And there is a possible small area of contrast
extravasation within that lesion.
2. No fracture identified.
3. Calcified peripheral vascular disease.

## 2020-09-14 MED ORDER — SODIUM CHLORIDE 0.9 % IV SOLN
1000.0000 mL | INTRAVENOUS | Status: AC
  Administered 2020-09-14 – 2020-09-15 (×2): 1000 mL via INTRAVENOUS

## 2020-09-14 MED ORDER — LIDOCAINE 5 % EX PTCH: 1.0000 | MEDICATED_PATCH | Freq: Every day | CUTANEOUS | Status: DC | PRN

## 2020-09-14 MED ORDER — IOHEXOL 300 MG/ML  SOLN
75.0000 mL | Freq: Once | INTRAMUSCULAR | Status: AC | PRN
  Administered 2020-09-14: 75 mL via INTRAVENOUS

## 2020-09-14 MED ORDER — PROTHROMBIN COMPLEX CONC HUMAN 500 UNITS IV KIT
4079.0000 [IU] | PACK | Status: AC
  Administered 2020-09-14: 4079 [IU] via INTRAVENOUS
  Filled 2020-09-14: qty 4079

## 2020-09-14 MED ORDER — ONDANSETRON HCL 4 MG/2ML IJ SOLN: 4.0000 mg | Freq: Four times a day (QID) | INTRAMUSCULAR | Status: DC | PRN

## 2020-09-14 MED ORDER — FOLIC ACID 1 MG PO TABS
1.0000 mg | ORAL_TABLET | Freq: Every day | ORAL | Status: DC
  Administered 2020-09-14 – 2020-09-17 (×4): 1 mg via ORAL
  Filled 2020-09-14 (×4): qty 1

## 2020-09-14 MED ORDER — HYDROMORPHONE HCL 1 MG/ML IJ SOLN
0.5000 mg | INTRAMUSCULAR | Status: DC | PRN
  Administered 2020-09-14: 0.5 mg via INTRAVENOUS
  Filled 2020-09-14: qty 1

## 2020-09-14 MED ORDER — ADULT MULTIVITAMIN W/MINERALS CH
1.0000 | ORAL_TABLET | Freq: Every day | ORAL | Status: DC
  Administered 2020-09-14 – 2020-09-17 (×4): 1 via ORAL
  Filled 2020-09-14 (×4): qty 1

## 2020-09-14 MED ORDER — ACETAMINOPHEN 325 MG PO TABS
650.0000 mg | ORAL_TABLET | Freq: Four times a day (QID) | ORAL | Status: DC | PRN
  Administered 2020-09-15 – 2020-09-16 (×3): 650 mg via ORAL
  Filled 2020-09-14 (×3): qty 2

## 2020-09-14 MED ORDER — SODIUM CHLORIDE 0.9 % IV SOLN
1000.0000 mL | INTRAVENOUS | Status: DC
  Administered 2020-09-14: 1000 mL via INTRAVENOUS

## 2020-09-14 MED ORDER — ALPRAZOLAM 0.25 MG PO TABS
0.2500 mg | ORAL_TABLET | Freq: Every evening | ORAL | Status: DC | PRN
  Administered 2020-09-15 – 2020-09-16 (×2): 0.25 mg via ORAL
  Filled 2020-09-14 (×2): qty 1

## 2020-09-14 MED ORDER — HYDROMORPHONE HCL 1 MG/ML IJ SOLN
1.0000 mg | Freq: Once | INTRAMUSCULAR | Status: AC
  Administered 2020-09-14: 1 mg via INTRAVENOUS
  Filled 2020-09-14: qty 1

## 2020-09-14 MED ORDER — SODIUM CHLORIDE 0.9 % IV SOLN: 1000.0000 mL | INTRAVENOUS | Status: DC

## 2020-09-14 MED ORDER — HYDROCODONE-ACETAMINOPHEN 5-325 MG PO TABS: 1.0000 | ORAL_TABLET | Freq: Four times a day (QID) | ORAL | Status: DC | PRN

## 2020-09-14 MED ORDER — LISINOPRIL 10 MG PO TABS
10.0000 mg | ORAL_TABLET | Freq: Every day | ORAL | Status: DC
  Administered 2020-09-14 – 2020-09-16 (×3): 10 mg via ORAL
  Filled 2020-09-14 (×3): qty 1

## 2020-09-14 MED ORDER — METHOCARBAMOL 500 MG PO TABS
500.0000 mg | ORAL_TABLET | Freq: Two times a day (BID) | ORAL | Status: DC | PRN
  Administered 2020-09-15: 500 mg via ORAL
  Filled 2020-09-14: qty 1

## 2020-09-14 MED ORDER — THIAMINE HCL 100 MG/ML IJ SOLN: 100.0000 mg | Freq: Every day | INTRAMUSCULAR | Status: DC

## 2020-09-14 MED ORDER — MORPHINE SULFATE (PF) 2 MG/ML IV SOLN
2.0000 mg | INTRAVENOUS | Status: DC | PRN
Start: 2020-09-14 — End: 2020-09-17

## 2020-09-14 MED ORDER — LORAZEPAM 2 MG/ML IJ SOLN: 1.0000 mg | INTRAMUSCULAR | Status: AC | PRN

## 2020-09-14 MED ORDER — LORAZEPAM 1 MG PO TABS: 1.0000 mg | ORAL_TABLET | ORAL | Status: AC | PRN

## 2020-09-14 MED ORDER — ROSUVASTATIN CALCIUM 5 MG PO TABS
10.0000 mg | ORAL_TABLET | Freq: Every day | ORAL | Status: DC
  Administered 2020-09-14 – 2020-09-16 (×3): 10 mg via ORAL
  Filled 2020-09-14 (×3): qty 2

## 2020-09-14 MED ORDER — FLECAINIDE ACETATE 100 MG PO TABS
100.0000 mg | ORAL_TABLET | Freq: Two times a day (BID) | ORAL | Status: DC
  Administered 2020-09-14 – 2020-09-17 (×7): 100 mg via ORAL
  Filled 2020-09-14 (×8): qty 1

## 2020-09-14 MED ORDER — SODIUM CHLORIDE 0.9 % IV BOLUS (SEPSIS)
1000.0000 mL | Freq: Once | INTRAVENOUS | Status: AC
  Administered 2020-09-14: 1000 mL via INTRAVENOUS

## 2020-09-14 MED ORDER — SERTRALINE HCL 100 MG PO TABS
100.0000 mg | ORAL_TABLET | Freq: Every day | ORAL | Status: DC
  Administered 2020-09-14 – 2020-09-17 (×4): 100 mg via ORAL
  Filled 2020-09-14 (×4): qty 1

## 2020-09-14 MED ORDER — ONDANSETRON HCL 4 MG PO TABS: 4.0000 mg | ORAL_TABLET | Freq: Four times a day (QID) | ORAL | Status: DC | PRN

## 2020-09-14 MED ORDER — ACETAMINOPHEN 650 MG RE SUPP: 650.0000 mg | Freq: Four times a day (QID) | RECTAL | Status: DC | PRN

## 2020-09-14 MED ORDER — SODIUM CHLORIDE 0.9% FLUSH
3.0000 mL | Freq: Two times a day (BID) | INTRAVENOUS | Status: DC
  Administered 2020-09-15 – 2020-09-17 (×3): 3 mL via INTRAVENOUS

## 2020-09-14 MED ORDER — THIAMINE HCL 100 MG PO TABS
100.0000 mg | ORAL_TABLET | Freq: Every day | ORAL | Status: DC
  Administered 2020-09-14 – 2020-09-17 (×4): 100 mg via ORAL
  Filled 2020-09-14 (×4): qty 1

## 2020-09-14 NOTE — ED Provider Notes (Signed)
[facility] EMERGENCY DEPARTMENT Provider Note  CSN: [id number] Arrival date & time: 09/14/20 0247  Chief Complaint(s) No chief complaint on file.  HPI Brandon Mendoza is a 80 y.o. male with past medical history listed below including A. fib on Eliquis who sustained a mechanical fall 1 week ago resulting in right buttock/thigh hematoma who is here for worsening right leg pain. Throbbing and deep aching in nature Worse with movement, palpation and ambulation Patient now endorsing numbness and tingling of the right foot. He denied any additional trauma. Patient was evaluated 2 days ago at [facility] for the same pain and had negative plain films.   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 Authorizing Provider  acetaminophen (TYLENOL) 500 MG tablet Take 1,000 mg by mouth every 6 (six) hours as  needed.    [provider]  ALPRAZolam Prudy Feeler) 0.5 MG tablet Take 0.5 mg by mouth at bedtime as needed. For anxiety    [provider]  apixaban (ELIQUIS) 5 MG TABS tablet Take 5 mg by mouth 2 (two) times daily. Bid    [provider]  Ascorbic Acid (VITAMIN C PO) Take 2 tablets by mouth daily.    [provider]  flecainide (TAMBOCOR) 100 MG tablet Take 100 mg by mouth 2 (two) times daily. 06/16/20   [provider]  lidocaine (LIDODERM) 5 % Place 1 patch onto the skin daily. Remove & Discard patch within 12 hours or as directed by MD 09/12/20   Caccavale, Sophia, PA-C  lisinopril (PRINIVIL,ZESTRIL) 10 MG tablet Take 10 mg by mouth daily.    [provider]  methocarbamol (ROBAXIN) 500 MG tablet Take 1 tablet (500 mg total) by mouth 2 (two) times daily as needed for muscle spasms. 09/12/20   Caccavale, Sophia, PA-C  Multiple Vitamins-Minerals (MULTIVITAMINS THER. W/MINERALS) TABS Take 1 tablet by mouth daily.    [provider]  Omega-3 Fatty Acids (FISH OIL) 1000 MG CAPS Take 1,000 mg by mouth daily.    [provider]  Rosuvastatin Calcium (CRESTOR PO) Take 10 mg by mouth daily.    [provider]  sertraline (ZOLOFT) 100 MG tablet Take 100 mg by mouth daily. 06/16/20   [provider]  sertraline (ZOLOFT) 50 MG tablet TAKE ONE TABLET BY MOUTH ONCE DAILY Patient not taking:  No sig reported 08/28/15   Marvel PlanXu, Jindong, MD                                                                                                                                    Past Surgical History Past Surgical History:  Procedure Laterality Date   CAROTID ENDARTERECTOMY  12/09/11   Left cea   ENDARTERECTOMY  12/09/2011   Procedure: ENDARTERECTOMY CAROTID;  Surgeon: Larina Earthlyodd F Early, MD;  [facility] OR;  Service: Vascular;  Laterality: Left;   EYE SURGERY     rt retina detachment   HAMMER TOE SURGERY  2004   HERNIA REPAIR  2006   SHOULDER  ARTHROSCOPY W/ ROTATOR CUFF REPAIR  2010   TONSILLECTOMY     Family History Family History  Problem Relation Age of Onset   Alzheimer's disease Father    Diabetes Other     Social History Social History   Tobacco Use   Smoking status: Former    Packs/day: 1.00    Years: 50.00    Pack years: 50.00    Types: Cigarettes    Quit date: 08/26/2004    Years since quitting: 16.0   Smokeless tobacco: Never   Tobacco comments:    quit smoking 08/2004  Substance Use Topics   Alcohol use: Yes    Alcohol/week: 14.0 standard drinks    Types: 14 Shots of liquor per week    Comment: 2-3 daily cocktails   Drug use: No   Allergies Patient has no known allergies.  Review of Systems Review of Systems All other systems are reviewed and are negative for acute change except as noted in the HPI  Physical Exam Vital Signs  I have reviewed the triage vital signs BP (!) 150/62   Pulse 74   Temp 97.7 F (36.5 C) (Oral)   Resp 14   Ht 5\' 8"  (1.727 m)   Wt 89.3 kg   SpO2 100%   BMI 29.93 kg/m   Physical Exam Vitals reviewed.  Constitutional:      General: He is not in acute distress.    Appearance: He is well-developed. He is not diaphoretic.  HENT:     Head: Normocephalic and atraumatic.     Right Ear: External ear normal.     Left Ear: External ear normal.     Nose: Nose normal.     Mouth/Throat:     Mouth: Mucous membranes are moist.  Eyes:     General: No scleral icterus.    Conjunctiva/sclera: Conjunctivae normal.  Neck:     Trachea: Phonation normal.  Cardiovascular:     Rate and Rhythm: Normal rate and regular rhythm.  Pulmonary:     Effort: Pulmonary effort is normal. No respiratory distress.     Breath sounds: No stridor.  Abdominal:     General: There is no distension.  Musculoskeletal:        General: Normal  range of motion.     Cervical back: Normal range of motion.     Right foot: Normal capillary refill. Normal pulse.       Legs:     Comments: Initial  exam with decreased strength of right foot, but after pain medicine provided, patient was able to dorsi/plantar flex as well as invert and evert with 4/5 strength.  Neurological:     Mental Status: He is alert and oriented to person, place, and time.  Psychiatric:        Behavior: Behavior normal.    ED Results and Treatments Labs (all labs ordered are listed, but only abnormal results are displayed) Labs Reviewed  CBC WITH DIFFERENTIAL/PLATELET - Abnormal; Notable for the following components:      Result Value   WBC 13.3 (*)    RBC 3.30 (*)    Hemoglobin 10.4 (*)    HCT 29.4 (*)    Neutro Abs 9.8 (*)    Monocytes Absolute 1.6 (*)    Abs Immature Granulocytes 0.12 (*)    All other components within normal limits  BASIC METABOLIC PANEL - Abnormal; Notable for the following components:   Sodium 132 (*)    CO2 21 (*)    Glucose, Bld 185 (*)    BUN 45 (*)    Creatinine, Ser 1.83 (*)    Calcium 8.6 (*)    GFR, Estimated 37 (*)    All other components within normal limits  RESP PANEL BY RT-PCR (FLU A&B, COVID) ARPGX2  HEPARIN LEVEL (UNFRACTIONATED)                                                                                                                         EKG  EKG Interpretation  Date/Time:  Monday September 14 2020 06:57:48 EDT Ventricular Rate:  76 PR Interval:  189 QRS Duration: 99 QT Interval:  405 QTC Calculation: 456 R Axis:   89 Text Interpretation: Sinus rhythm Borderline right axis deviation No acute changes Confirmed by Drema Pry [phone]) on 09/14/2020 7:08:37 AM        Radiology CT FEMUR RIGHT W WO CONTRAST  Result Date: 09/14/2020 CLINICAL DATA:  80 year old male with large hematoma after fall 6 days ago. Unrevealing hip radiograph, query Morel-Lavalle lesion. EXAM: CT OF THE LOWER RIGHT EXTREMITY WITHOUT CONTRAST TECHNIQUE: Multidetector CT imaging of the lower right extremity was performed following the standard protocol before and during bolus  administration of intravenous contrast. CONTRAST:  81mL OMNIPAQUE IOHEXOL 300 MG/ML  SOLN COMPARISON:  Right hip series 09/12/2020. FINDINGS: Visible pelvis demonstrates iliac artery atherosclerosis. Calcified peripheral vascular disease but the major arterial structures in the visible pelvis and the visible right lower extremity appear to be patent. The major venous structures in the distal right lower extremity are enhancing at the time of imaging and appear to be patent. Negative urinary bladder. Negative visible bowel in the pelvis. There is presacral stranding in the pelvis, but no free fluid. Bone windows  demonstrate no right hemipelvis fracture. The proximal right femur appears intact. The right femoral shaft and visible distal femur, patella, proximal tibia and fibula appear intact. No fracture identified. No knee joint effusion. Soft tissue windows demonstrate widespread lateral subcutaneous soft tissue stranding beginning at the flank and continuing distally in the leg. The subcutaneous stranding becomes posterior near the knee. Posterior to the proximal femur beginning just below the level of the lesser trochanter there is an oval, bilobed heterogeneous intramuscular lesion (series 10, image 145 and series 7, image 46 extending about 14 cm in length which most resembles a large intramuscular hematoma, and therea could be a small focus of contrast extravasation on series 10, image 146. Some lobular extension of this lesion toward the superficial fossa is identified (series 7, image 31), but there is no solid or cystic mass superficial to the deep fascia to suggest Morel-Lavalle lesion. All told, this lesion encompasses about 52 x 68 by 143 mm (250 mL estimated volume) furthermore, there is also some gluteal and other regional intramuscular hematoma suspected just below the pelvis such as on series 10, images 32 and 65. No soft tissue gas. No intramuscular hematoma identified more distally. IMPRESSION: 1.  No cystic or solid mass superficial to the deep fascia to suggest a Morel-Lavalle lesion, but rather multifocal intramuscular hematoma is suspected from the right buttocks region tracking posteriorly - including a large roughly 250 mL hematoma in the musculature posterior to the femoral shaft beginning just below the lesser trochanter. And there is a possible small area of contrast extravasation within that lesion. 2. No fracture identified. 3. Calcified peripheral vascular disease. Electronically Signed   By: Odessa Fleming M.D.   On: 09/14/2020 05:27    Pertinent labs & imaging results that were available during my care of the patient were reviewed by me and considered in my medical decision making (see chart for details).  Medications Ordered in ED Medications  sodium chloride 0.9 % bolus 1,000 mL (0 mLs Intravenous Stopped 09/14/20 0533)    Followed by  0.9 %  sodium chloride infusion (1,000 mLs Intravenous New Bag/Given 09/14/20 0457)  prothrombin complex conc human (KCENTRA) IVPB 4,355 Units (has no administration in time range)  HYDROmorphone (DILAUDID) injection 1 mg (1 mg Intravenous Given 09/14/20 0332)  iohexol (OMNIPAQUE) 300 MG/ML solution 75 mL (75 mLs Intravenous Contrast Given 09/14/20 0444)                                                                                                                                    Procedures .1-3 Lead EKG Interpretation  Date/Time: 09/14/2020 6:51 AM Performed by: Nira Conn, MD Authorized by: Nira Conn, MD     Interpretation: normal     ECG rate:  74   ECG rate assessment: normal     Rhythm: sinus rhythm     Ectopy: none     Conduction: normal   .  Critical Care  Date/Time: 09/14/2020 6:52 AM Performed by: Nira Conn, MD Authorized by: Nira Conn, MD   Critical care provider statement:    Critical care time (minutes):  60   Critical care was necessary to treat or prevent imminent or  life-threatening deterioration of the following conditions:  Trauma   Critical care was time spent personally by me on the following activities:  Discussions with consultants, evaluation of patient's response to treatment, examination of patient, ordering and performing treatments and interventions, ordering and review of laboratory studies, ordering and review of radiographic studies, pulse oximetry, re-evaluation of patient's condition, obtaining history from patient or surrogate and review of old charts   Care discussed with: admitting provider    (including critical care time)  Medical Decision Making / ED Course I have reviewed the nursing notes for this encounter and the patient's prior records (if available in EHR or on provided paperwork).   DEAUNTE DENTE was evaluated in Emergency Department on 09/14/2020 for the symptoms described in the history of present illness. He was evaluated in the context of the global COVID-19 pandemic, which necessitated consideration that the patient might be at risk for infection with the SARS-CoV-2 virus that causes COVID-19. Institutional protocols and algorithms that pertain to the evaluation of patients at risk for COVID-19 are in a state of rapid change based on information released by regulatory bodies including the CDC and federal and state organizations. These policies and algorithms were followed during the patient's care in the ED.  Patient presented with right thigh pain and paresthesias to right foot.  Initially patient had decreased strength but it appears this was likely effort related as patient was able to move the right foot after pain medicine was provided.  Additionally patient is able to move the foot even more when not instructed to.  Given the patient's recent fall and large right hematoma, CT scan was obtained to assess for [facility] versus large hematoma with active extra. There is a concern for developing compartment syndrome, but  patient does have intact pulses.  Labs notable for leukocytosis and 5 g drop in his hemoglobin when compared to labs from outside hospital 6 months ago.  Patient also with mild AKI compared to those previous labs.  CT scan did reveal large hematoma with possible active extra of.  I consulted Dr. Victorino Dike from orthopedic surgery who requested that we place a large compression wrap around the right thigh and reverse patient's Eliquis.  He also requested patient be admitted to hospitalist service at [facility].  He will contact the orthopedic trauma service to evaluate patient and determine whether he would need surgical intervention      Final Clinical Impression(s) / ED Diagnoses Final diagnoses:  Traumatic hematoma of right thigh, initial encounter  Blood loss anemia      This chart was dictated using voice recognition software.  Despite best efforts to proofread,  errors can occur which can change the documentation meaning.    Nira Conn, MD 09/14/20 [phone]

## 2020-09-14 NOTE — Progress Notes (Signed)
Orthopedic Tech Progress Note Patient Details:  Brandon Mendoza Aug 15, 1940 [id number]  Ortho Devices Type of Ortho Device: CAM walker Ortho Device/Splint Location: RLE Ortho Device/Splint Interventions: Ordered, Application, Adjustment   Post Interventions Patient Tolerated: Well Instructions Provided: Adjustment of device, Care of device, Poper ambulation with device  [name] 09/14/2020, 5:06 PM

## 2020-09-14 NOTE — ED Notes (Signed)
Ace Wraps was applied on the right leg.

## 2020-09-14 NOTE — H&P (Addendum)
History and Physical    Brandon Mendoza GBT:[id number] DOB: 1940/09/25 DOA: 09/14/2020  Referring MD/NP/PA: Drema Pry, MD PCP: Harold Barban, MD  Consultants: Charmayne Sheer, MD-neurology, [facility] Patient coming from: [facility]  Chief Complaint: Right leg pain  I have personally briefly reviewed patient's old medical records in [facility]   HPI: Brandon Mendoza is a 80 y.o. 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   He was taking his lawnmower which had just gotten repaired out of the car 6 days ago when he lost his balance falling onto his buttock on the right side.  He initially seemed to be doing okay after the fall and denied any trauma to his head or loss of consciousness.  He followed up with Dr. Ethelene Hal of orthopedics for spinal injection for spinal stenosis 6/16.  The following morning he had gone to the gym and did approximately 6 miles on a bike.  Later on that evening however he started having throbbing pain in his right hip and leg.  Any kind of movement or palpation made symptoms worse.  He went to the emergency department on 6/18 due to severity of symptoms.  X-rays of the right hip and pelvis were performed, but did not show any acute fractures.  He was given a lidocaine patch and Robaxin and ultimately discharged home.  Yesterday his pain symptoms worsen and states that he lost feeling in the in the right leg from the knee down.  Due to the severity of his symptoms he was unable to get the rest last night.  He had been taking his Eliquis and his last dose was yesterday evening.  For the last 15 months  he has been having issues with balance that he has been being worked up by neurology for possible causes.  He does admit that he is a drinker of 2-3 martinis per day on average, but stopped drinking couple days  ago.  ED Course: Upon admission into the emergency department patient was noted to have relatively stable vital signs.  Labs significant for WBC 13.3, hemoglobin 10.4, Na 132, BUN 45, and Cr 1.85.  CT of the right femur noted a large 250 mL hematoma in the musculature with possible area of contrast extravasation.  He was noted to have palpable pulses.  Dr. Victorino Dike of orthopedics was consulted.  Patient's leg was wrapped and Kcentra was given.  His COVID-19 and influenza screening was negative.  TRH called to admit.  Review of Systems  Constitutional:  Negative for fever and malaise/fatigue.  HENT:  Negative for ear discharge and nosebleeds.   Eyes:  Negative for photophobia and pain.  Respiratory:  Negative for cough and shortness of breath.   Cardiovascular:  Positive for palpitations. Negative for chest pain.  Gastrointestinal:  Negative for abdominal pain, nausea and vomiting.  Genitourinary:  Negative for dysuria.  Musculoskeletal:  Positive for falls and myalgias.  Skin:  Negative for rash.  Neurological:  Positive for tingling and sensory change. Negative for loss of consciousness.       Positive for balance issues  Endo/Heme/Allergies:  Bruises/bleeds easily.  Psychiatric/Behavioral:  Positive for substance abuse. The patient has insomnia.    Past Medical History:  Diagnosis Date   Anxiety    Carotid artery occlusion    COPD (chronic obstructive pulmonary disease) (HCC)    Depression  Hx of colonic polyps    Hyperlipidemia    Hypertension    Nocturia    Peripheral vascular disease (HCC)    Sleep apnea    on 6 cm, nasal pillow   Stroke (HCC) 05/01/2011   ischemic    Past Surgical History:  Procedure Laterality Date   CAROTID ENDARTERECTOMY  12/09/11   Left cea   ENDARTERECTOMY  12/09/2011   Procedure: ENDARTERECTOMY CAROTID;  Surgeon: Larina Earthly, MD;  Location: [facility] OR;  Service: Vascular;  Laterality: Left;   EYE SURGERY     rt retina detachment   HAMMER TOE SURGERY   2004   HERNIA REPAIR  2006   SHOULDER ARTHROSCOPY W/ ROTATOR CUFF REPAIR  2010   TONSILLECTOMY       reports that he quit smoking about 16 years ago. His smoking use included cigarettes. He has a 50.00 pack-year smoking history. He has never used smokeless tobacco. He reports current alcohol use of about 14.0 standard drinks of alcohol per week. He reports that he does not use drugs.  No Known Allergies  Family History  Problem Relation Age of Onset   Alzheimer's disease Father    Diabetes Other     Prior to Admission medications   Medication Sig Start Date End Date Taking? Authorizing Provider  acetaminophen (TYLENOL) 500 MG tablet Take 1,000 mg by mouth every 6 (six) hours as needed.    [provider]  ALPRAZolam Prudy Feeler) 0.5 MG tablet Take 0.5 mg by mouth at bedtime as needed. For anxiety    [provider]  apixaban (ELIQUIS) 5 MG TABS tablet Take 5 mg by mouth 2 (two) times daily. Bid    [provider]  Ascorbic Acid (VITAMIN C PO) Take 2 tablets by mouth daily.    [provider]  flecainide (TAMBOCOR) 100 MG tablet Take 100 mg by mouth 2 (two) times daily. 06/16/20   [provider]  lidocaine (LIDODERM) 5 % Place 1 patch onto the skin daily. Remove & Discard patch within 12 hours or as directed by MD 09/12/20   Caccavale, Sophia, PA-C  lisinopril (PRINIVIL,ZESTRIL) 10 MG tablet Take 10 mg by mouth daily.    [provider]  methocarbamol (ROBAXIN) 500 MG tablet Take 1 tablet (500 mg total) by mouth 2 (two) times daily as needed for muscle spasms. 09/12/20   Caccavale, Sophia, PA-C  Multiple Vitamins-Minerals (MULTIVITAMINS THER. W/MINERALS) TABS Take 1 tablet by mouth daily.    [provider]  Omega-3 Fatty Acids (FISH OIL) 1000 MG CAPS Take 1,000 mg by mouth daily.    [provider]  Rosuvastatin Calcium (CRESTOR PO) Take 10 mg by mouth daily.    [provider]  sertraline (ZOLOFT) 100 MG tablet  Take 100 mg by mouth daily. 06/16/20   [provider]  sertraline (ZOLOFT) 50 MG tablet TAKE ONE TABLET BY MOUTH ONCE DAILY Patient not taking: No sig reported 08/28/15   Marvel Plan, MD    Physical Exam:  Constitutional: Elderly male currently in no acute distress Vitals:   09/14/20 0647 09/14/20 0800 09/14/20 0830 09/14/20 0924  BP:  (!) 160/59 (!) 145/72 (!) 146/64  Pulse:  80 70 78  Resp:  20 13 16   Temp:   97.8 F (36.6 C) 97.7 F (36.5 C)  TempSrc:   Oral Oral  SpO2:  100% 99% 100%  Weight: 89.3 kg     Height:       Eyes: PERRL, lids and  conjunctivae normal ENMT: Mucous membranes are moist. Posterior pharynx clear of any exudate or lesions.  Neck: normal, supple, no masses, no thyromegaly Respiratory: clear to auscultation bilaterally, no wheezing, no crackles. Normal respiratory effort. No accessory muscle use.  Cardiovascular: Regular rate and rhythm, no murmurs / rubs / gallops.  2+ pedal pulses. No carotid bruits.  Abdomen: no tenderness, no masses palpated. No hepatosplenomegaly. Bowel sounds positive.  Musculoskeletal: no clubbing / cyanosis.  Right thigh currently wrapped Skin: no rashes, lesions, ulcers. No induration Neurologic: CN 2-12 grossly intact.  Abnormal sensation noted of the right lower extremity.  Patient unable to dorsiflex right foot, but is able to rise up right leg with effort. Psychiatric: Normal judgment and insight. Alert and oriented x 3.  Irritated mood.     Labs on Admission: I have personally reviewed following labs and imaging studies  CBC: Recent Labs  Lab 09/14/20 0329  WBC 13.3*  NEUTROABS 9.8*  HGB 10.4*  HCT 29.4*  MCV 89.1  PLT 279   Basic Metabolic Panel: Recent Labs  Lab 09/14/20 0329  NA 132*  K 4.5  CL 100  CO2 21*  GLUCOSE 185*  BUN 45*  CREATININE 1.83*  CALCIUM 8.6*   GFR: Estimated Creatinine Clearance: 34.4 mL/min (A) (by C-G formula based on SCr of 1.83 mg/dL (H)). Liver Function Tests: No  results for input(s): AST, ALT, ALKPHOS, BILITOT, PROT, ALBUMIN in the last 168 hours. No results for input(s): LIPASE, AMYLASE in the last 168 hours. No results for input(s): AMMONIA in the last 168 hours. Coagulation Profile: No results for input(s): INR, PROTIME in the last 168 hours. Cardiac Enzymes: No results for input(s): CKTOTAL, CKMB, CKMBINDEX, TROPONINI in the last 168 hours. BNP (last 3 results) No results for input(s): PROBNP in the last 8760 hours. HbA1C: No results for input(s): HGBA1C in the last 72 hours. CBG: No results for input(s): GLUCAP in the last 168 hours. Lipid Profile: No results for input(s): CHOL, HDL, LDLCALC, TRIG, CHOLHDL, LDLDIRECT in the last 72 hours. Thyroid Function Tests: No results for input(s): TSH, T4TOTAL, FREET4, T3FREE, THYROIDAB in the last 72 hours. Anemia Panel: No results for input(s): VITAMINB12, FOLATE, FERRITIN, TIBC, IRON, RETICCTPCT in the last 72 hours. Urine analysis:    Component Value Date/Time   COLORURINE YELLOW 11/29/2011 1746   APPEARANCEUR CLEAR 11/29/2011 1746   LABSPEC 1.027 11/29/2011 1746   PHURINE 5.5 11/29/2011 1746   GLUCOSEU NEGATIVE 11/29/2011 1746   HGBUR NEGATIVE 11/29/2011 1746   BILIRUBINUR NEGATIVE 11/29/2011 1746   KETONESUR NEGATIVE 11/29/2011 1746   PROTEINUR NEGATIVE 11/29/2011 1746   UROBILINOGEN 0.2 11/29/2011 1746   NITRITE NEGATIVE 11/29/2011 1746   LEUKOCYTESUR NEGATIVE 11/29/2011 1746   Sepsis Labs: Recent Results (from the past 240 hour(s))  Resp Panel by RT-PCR (Flu A&B, Covid) Nasopharyngeal Swab     Status: None   Collection Time: 09/14/20  5:35 AM   Specimen: Nasopharyngeal Swab; Nasopharyngeal(NP) swabs in vial transport medium  Result Value Ref Range Status   SARS Coronavirus 2 by RT PCR NEGATIVE NEGATIVE Final    Comment: (NOTE) SARS-CoV-2 target nucleic acids are NOT DETECTED.  The SARS-CoV-2 RNA is generally detectable in upper respiratory specimens during the acute phase of  infection. The lowest concentration of SARS-CoV-2 viral copies this assay can detect is 138 copies/mL. A negative result does not preclude SARS-Cov-2 infection and should not be used as the sole basis for treatment or other patient management decisions. A negative result may occur with  improper specimen collection/handling, submission of specimen other than nasopharyngeal swab, presence of viral mutation(s) within the areas targeted by this assay, and inadequate number of viral copies(<138 copies/mL). A negative result must be combined with clinical observations, patient history, and epidemiological information. The expected result is Negative.  Fact Sheet for Patients:  BloggerCourse.com  Fact Sheet for Healthcare Providers:  SeriousBroker.it  This test is no t yet approved or cleared by the [place] FDA and  has been authorized for detection and/or diagnosis of SARS-CoV-2 by FDA under an Emergency Use Authorization (EUA). This EUA will remain  in effect (meaning this test can be used) for the duration of the COVID-19 declaration under Section 564(b)(1) of the Act, 21 U.S.C.section 360bbb-3(b)(1), unless the authorization is terminated  or revoked sooner.       Influenza A by PCR NEGATIVE NEGATIVE Final   Influenza B by PCR NEGATIVE NEGATIVE Final    Comment: (NOTE) The Xpert Xpress SARS-CoV-2/FLU/RSV plus assay is intended as an aid in the diagnosis of influenza from Nasopharyngeal swab specimens and should not be used as a sole basis for treatment. Nasal washings and aspirates are unacceptable for Xpert Xpress SARS-CoV-2/FLU/RSV testing.  Fact Sheet for Patients: BloggerCourse.com  Fact Sheet for Healthcare Providers: SeriousBroker.it  This test is not yet approved or cleared by the [place] FDA and has been authorized for detection and/or diagnosis of SARS-CoV-2  by FDA under an Emergency Use Authorization (EUA). This EUA will remain in effect (meaning this test can be used) for the duration of the COVID-19 declaration under Section 564(b)(1) of the Act, 21 U.S.C. section 360bbb-3(b)(1), unless the authorization is terminated or revoked.  Performed at [facility], [address] [place], [place] [address]      Radiological Exams on Admission: CT FEMUR RIGHT W WO CONTRAST  Result Date: 09/14/2020 CLINICAL DATA:  80 year old male with large hematoma after fall 6 days ago. Unrevealing hip radiograph, query Morel-Lavalle lesion. EXAM: CT OF THE LOWER RIGHT EXTREMITY WITHOUT CONTRAST TECHNIQUE: Multidetector CT imaging of the lower right extremity was performed following the standard protocol before and during bolus administration of intravenous contrast. CONTRAST:  60mL OMNIPAQUE IOHEXOL 300 MG/ML  SOLN COMPARISON:  Right hip series 09/12/2020. FINDINGS: Visible pelvis demonstrates iliac artery atherosclerosis. Calcified peripheral vascular disease but the major arterial structures in the visible pelvis and the visible right lower extremity appear to be patent. The major venous structures in the distal right lower extremity are enhancing at the time of imaging and appear to be patent. Negative urinary bladder. Negative visible bowel in the pelvis. There is presacral stranding in the pelvis, but no free fluid. Bone windows demonstrate no right hemipelvis fracture. The proximal right femur appears intact. The right femoral shaft and visible distal femur, patella, proximal tibia and fibula appear intact. No fracture identified. No knee joint effusion. Soft tissue windows demonstrate widespread lateral subcutaneous soft tissue stranding beginning at the flank and continuing distally in the leg. The subcutaneous stranding becomes posterior near the knee. Posterior to the proximal femur beginning just below the level of the lesser trochanter there is an  oval, bilobed heterogeneous intramuscular lesion (series 10, image 145 and series 7, image 46 extending about 14 cm in length which most resembles a large intramuscular hematoma, and therea could be a small focus of contrast extravasation on series 10, image 146. Some lobular extension of this lesion toward the superficial fossa is identified (series 7, image 31), but there is no  solid or cystic mass superficial to the deep fascia to suggest Morel-Lavalle lesion. All told, this lesion encompasses about 52 x 68 by 143 mm (250 mL estimated volume) furthermore, there is also some gluteal and other regional intramuscular hematoma suspected just below the pelvis such as on series 10, images 32 and 65. No soft tissue gas. No intramuscular hematoma identified more distally. IMPRESSION: 1. No cystic or solid mass superficial to the deep fascia to suggest a Morel-Lavalle lesion, but rather multifocal intramuscular hematoma is suspected from the right buttocks region tracking posteriorly - including a large roughly 250 mL hematoma in the musculature posterior to the femoral shaft beginning just below the lesser trochanter. And there is a possible small area of contrast extravasation within that lesion. 2. No fracture identified. 3. Calcified peripheral vascular disease. Electronically Signed   By: Odessa FlemingH  Hall M.D.   On: 09/14/2020 05:27    EKG: Independently reviewed.  Sinus rhythm at 76 bpm with borderline right axis deviation  Assessment/Plan Right thigh hematoma secondary to fall: Patient reports falling on his buttock on the right side 6 days ago.  He is on Eliquis and had continued to take this medication as prescribed.  Patient developed progressively worsening pain on the right leg over the last 4 days.  CT imaging shows large hematoma of the right thigh. -Admit to a medical telemetry -Neurovascular checks every 4 hours - -Hydrocodone/IV morphine as needed moderate to severe pain -Appreciate orthopedics  consultative services, we will follow-up for any further recommendations Home  Leukocytosis: Acute.  WBC elevated 13.3.  Suspect reactive to above.  No reports of any acute fractures. -Recheck CBC in a.m.  Normocytic anemia: Acute.  Hemoglobin 10.4 g/dL on admission.  Possible extravasation noted on CT imaging.  Last available hemoglobin was 15.9 g/dL in 16/109611/2021 on care everywhere. -Type and screen for the possible need of blood products -Recheck H&H -Continue to monitor and transfuse blood products if needed  Acute kidney injury: Creatinine 1.83 with BUN 45 on admission.  His baseline creatinine previously had been 1.23 mg/dL per records from 04/540911/2021 on care everywhere.  He had been given 1 L normal saline IV fluids and placed on a rate of 125 mL/h. -Check CK level -Continue normal saline IV fluids 100 mL/h overnight -Recheck kidney function in a.m.  Hyponatremia: Acute.  Sodium 132 on admission.  Patient has not had a drink since yesterday evening. -Recheck sodium levels in a.m.  Paroxysmal atrial fibrillation on anticoagulation: On admission patient appears to be in sinus rhythm.  He was given Kcentra for reversal given right thigh hematoma. -Hold Eliquis -Continue flecainide  Essential hypertension: On admission blood pressures have been stable -Continue home blood pressure regimen  Disequilibrium/frequent falls - -Continue outpatient follow-up with Dr.  Peripheral vascular disease History of CVA s/p left carotid endarterectomy -Continue statin  Anxiety -Continue Zoloft and Xanax as needed  Alcohol abuse: Patient reported drinking 2-3 martinis per day on average, but reports that he just recently stopped few days ago. -CIWA protocols with Ativan as needed  OSA on CPAP: Patient reports compliance with CPAP. -Continue CPAP nightly  DVT prophylaxis: SCDs Code Status: Full Family Communication: Declined Disposition Plan: To be determined Consults called:  Orthopedics Admission status: Observation  Clydie Braunondell A Loghan Subia MD [facility]   If 7PM-7AM, please contact night-coverage   09/14/2020, 10:43 AM

## 2020-09-14 NOTE — Consult Note (Addendum)
Reason for Consult:Right thigh hematoma Referring Physician: Madelyn Flavorsondell Smith Time called: 1118 Time at bedside: 1143   Brandon Mendoza is an 80 y.o. male.  HPI: Brandon Mendoza developed right leg pain and numbness on Saturday night. He had fallen the previous Tuesday and hurt the same area but is unsure if that was the cause. He was taken to the ED where CT scan showed a deep posterior thigh hematoma. [facility] was [facility] and orthopedic surgery was consulted. He lives at home with his wife and falls often at baseline 2/2 prior CVA.  Past Medical History:  Diagnosis Date   Anxiety    Carotid artery occlusion    COPD (chronic obstructive pulmonary disease) (HCC)    Depression    Hx of colonic polyps    Hyperlipidemia    Hypertension    Nocturia    Peripheral vascular disease (HCC)    Sleep apnea    on 6 cm, nasal pillow   Stroke (HCC) 05/01/2011   ischemic    Past Surgical History:  Procedure Laterality Date   CAROTID ENDARTERECTOMY  12/09/11   Left cea   ENDARTERECTOMY  12/09/2011   Procedure: ENDARTERECTOMY CAROTID;  Surgeon: Larina Earthlyodd F Early, MD;  [facility];  Service: Vascular;  Laterality: Left;   EYE SURGERY     rt retina detachment   HAMMER TOE SURGERY  2004   HERNIA REPAIR  2006   SHOULDER ARTHROSCOPY W/ ROTATOR CUFF REPAIR  2010   TONSILLECTOMY      Family History  Problem Relation Age of Onset   Alzheimer's disease Father    Diabetes Other     Social History:  reports that he quit smoking about 16 years ago. His smoking use included cigarettes. He has a 50.00 pack-year smoking history. He has never used smokeless tobacco. He reports current alcohol use of about 14.0 standard drinks of alcohol per week. He reports that he does not use drugs.  Allergies: No Known Allergies  Medications: I have reviewed the patient's current medications.  Results for orders placed or performed during the hospital encounter of 09/14/20 (from the past 48 hour(s))  CBC with  Differential/Platelet     Status: Abnormal   Collection Time: 09/14/20  3:29 AM  Result Value Ref Range   WBC 13.3 (H) 4.0 - 10.5 K/uL   RBC 3.30 (L) 4.22 - 5.81 MIL/uL   Hemoglobin 10.4 (L) 13.0 - 17.0 g/dL   HCT 16.129.4 (L) 09.639.0 - 04.552.0 %   MCV 89.1 80.0 - 100.0 fL   MCH 31.5 26.0 - 34.0 pg   MCHC 35.4 30.0 - 36.0 g/dL   RDW 40.912.4 81.111.5 - 91.415.5 %   Platelets 279 150 - 400 K/uL   nRBC 0.0 0.0 - 0.2 %   Neutrophils Relative % 74 %   Neutro Abs 9.8 (H) 1.7 - 7.7 K/uL   Lymphocytes Relative 11 %   Lymphs Abs 1.5 0.7 - 4.0 K/uL   Monocytes Relative 12 %   Monocytes Absolute 1.6 (H) 0.1 - 1.0 K/uL   Eosinophils Relative 1 %   Eosinophils Absolute 0.2 0.0 - 0.5 K/uL   Basophils Relative 1 %   Basophils Absolute 0.1 0.0 - 0.1 K/uL   Immature Granulocytes 1 %   Abs Immature Granulocytes 0.12 (H) 0.00 - 0.07 K/uL    Comment: Performed at [facility] [place], [address] [place], [place] [id number]  Basic metabolic panel     Status: Abnormal  Collection Time: 09/14/20  3:29 AM  Result Value Ref Range   Sodium 132 (L) 135 - 145 mmol/L   Potassium 4.5 3.5 - 5.1 mmol/L   Chloride 100 98 - 111 mmol/L   CO2 21 (L) 22 - 32 mmol/L   Glucose, Bld 185 (H) 70 - 99 mg/dL    Comment: Glucose reference range applies only to samples taken after fasting for at least 8 hours.   BUN 45 (H) 8 - 23 mg/dL   Creatinine, Ser 2.68 (H) 0.61 - 1.24 mg/dL   Calcium 8.6 (L) 8.9 - 10.3 mg/dL   GFR, Estimated 37 (L) >60 mL/min    Comment: (NOTE) Calculated using the CKD-EPI Creatinine Equation (2021)    Anion gap 11 5 - 15    Comment: Performed at [facility], [address] [place], [place] [address]  Resp Panel by RT-PCR (Flu A&B, Covid) Nasopharyngeal Swab     Status: None   Collection Time: 09/14/20  5:35 AM   Specimen: Nasopharyngeal Swab; Nasopharyngeal(NP) swabs in vial transport medium  Result Value Ref Range   SARS Coronavirus 2 by RT PCR NEGATIVE NEGATIVE    Comment:  (NOTE) SARS-CoV-2 target nucleic acids are NOT DETECTED.  The SARS-CoV-2 RNA is generally detectable in upper respiratory specimens during the acute phase of infection. The lowest concentration of SARS-CoV-2 viral copies this assay can detect is 138 copies/mL. A negative result does not preclude SARS-Cov-2 infection and should not be used as the sole basis for treatment or other patient management decisions. A negative result may occur with  improper specimen collection/handling, submission of specimen other than nasopharyngeal swab, presence of viral mutation(s) within the areas targeted by this assay, and inadequate number of viral copies(<138 copies/mL). A negative result must be combined with clinical observations, patient history, and epidemiological information. The expected result is Negative.  Fact Sheet for Patients:  BloggerCourse.com  Fact Sheet for Healthcare Providers:  SeriousBroker.it  This test is no t yet approved or cleared by the [place] FDA and  has been authorized for detection and/or diagnosis of SARS-CoV-2 by FDA under an Emergency Use Authorization (EUA). This EUA will remain  in effect (meaning this test can be used) for the duration of the COVID-19 declaration under Section 564(b)(1) of the Act, 21 U.S.C.section 360bbb-3(b)(1), unless the authorization is terminated  or revoked sooner.       Influenza A by PCR NEGATIVE NEGATIVE   Influenza B by PCR NEGATIVE NEGATIVE    Comment: (NOTE) The Xpert Xpress SARS-CoV-2/FLU/RSV plus assay is intended as an aid in the diagnosis of influenza from Nasopharyngeal swab specimens and should not be used as a sole basis for treatment. Nasal washings and aspirates are unacceptable for Xpert Xpress SARS-CoV-2/FLU/RSV testing.  Fact Sheet for Patients: BloggerCourse.com  Fact Sheet for Healthcare  Providers: SeriousBroker.it  This test is not yet approved or cleared by the [place] FDA and has been authorized for detection and/or diagnosis of SARS-CoV-2 by FDA under an Emergency Use Authorization (EUA). This EUA will remain in effect (meaning this test can be used) for the duration of the COVID-19 declaration under Section 564(b)(1) of the Act, 21 U.S.C. section 360bbb-3(b)(1), unless the authorization is terminated or revoked.  Performed at [facility], [address] [place], [place] [address]   Heparin level - if patient on rivaroxaban Carlena Hurl) or apixaban Everlene Balls)     Status: Abnormal   Collection Time: 09/14/20  6:54 AM  Result Value Ref Range   Heparin Unfractionated >1.10 (H) 0.30 - 0.70 IU/mL    Comment: (NOTE) The clinical reportable range upper limit is being lowered to >1.10 to align with the FDA approved guidance for the current laboratory assay.  If heparin results are below expected values, and patient dosage has  been confirmed, suggest follow up testing of antithrombin III levels. Performed at [facility], [address], [place], [place] [address]   Hemoglobin and hematocrit, blood     Status: Abnormal   Collection Time: 09/14/20 10:59 AM  Result Value Ref Range   Hemoglobin 9.6 (L) 13.0 - 17.0 g/dL   HCT 93.5 (L) 70.1 - 77.9 %    Comment: Performed at [facility], [address] [place], [place] [address]  Type and screen [facility]     Status: None (Preliminary result)   Collection Time: 09/14/20 11:00 AM  Result Value Ref Range   ABO/RH(D) PENDING    Antibody Screen PENDING    Sample Expiration      09/17/2020,2359 Performed at [facility], [address] [place], [place] [address]     CT FEMUR RIGHT W WO CONTRAST  Result Date: 09/14/2020 CLINICAL DATA:  80 year old male with large hematoma after fall 6 days ago. Unrevealing hip radiograph, query  Morel-Lavalle lesion. EXAM: CT OF THE LOWER RIGHT EXTREMITY WITHOUT CONTRAST TECHNIQUE: Multidetector CT imaging of the lower right extremity was performed following the standard protocol before and during bolus administration of intravenous contrast. CONTRAST:  11mL OMNIPAQUE IOHEXOL 300 MG/ML  SOLN COMPARISON:  Right hip series 09/12/2020. FINDINGS: Visible pelvis demonstrates iliac artery atherosclerosis. Calcified peripheral vascular disease but the major arterial structures in the visible pelvis and the visible right lower extremity appear to be patent. The major venous structures in the distal right lower extremity are enhancing at the time of imaging and appear to be patent. Negative urinary bladder. Negative visible bowel in the pelvis. There is presacral stranding in the pelvis, but no free fluid. Bone windows demonstrate no right hemipelvis fracture. The proximal right femur appears intact. The right femoral shaft and visible distal femur, patella, proximal tibia and fibula appear intact. No fracture identified. No knee joint effusion. Soft tissue windows demonstrate widespread lateral subcutaneous soft tissue stranding beginning at the flank and continuing distally in the leg. The subcutaneous stranding becomes posterior near the knee. Posterior to the proximal femur beginning just below the level of the lesser trochanter there is an oval, bilobed heterogeneous intramuscular lesion (series 10, image 145 and series 7, image 46 extending about 14 cm in length which most resembles a large intramuscular hematoma, and therea could be a small focus of contrast extravasation on series 10, image 146. Some lobular extension of this lesion toward the superficial fossa is identified (series 7, image 31), but there is no solid or cystic mass superficial to the deep fascia to suggest Morel-Lavalle lesion. All told, this lesion encompasses about 52 x 68 by 143 mm (250 mL estimated volume) furthermore, there is also  some gluteal and other regional intramuscular hematoma suspected just below the pelvis such as on series 10, images 32 and 65. No soft tissue gas. No intramuscular hematoma identified more distally. IMPRESSION: 1. No cystic or solid mass superficial to the deep fascia to suggest a Morel-Lavalle lesion, but rather multifocal intramuscular hematoma is suspected from the right buttocks region tracking posteriorly - including a large roughly 250 mL hematoma in the  musculature posterior to the femoral shaft beginning just below the lesser trochanter. And there is a possible small area of contrast extravasation within that lesion. 2. No fracture identified. 3. Calcified peripheral vascular disease. Electronically Signed   By: Odessa Fleming M.D.   On: 09/14/2020 05:27    Review of Systems  HENT:  Negative for ear discharge, ear pain, hearing loss and tinnitus.   Eyes:  Negative for photophobia and pain.  Respiratory:  Negative for cough and shortness of breath.   Cardiovascular:  Negative for chest pain.  Gastrointestinal:  Negative for abdominal pain, nausea and vomiting.  Genitourinary:  Negative for dysuria, flank pain, frequency and urgency.  Musculoskeletal:  Positive for arthralgias (Right hip). Negative for back pain, myalgias and neck pain.  Neurological:  Positive for numbness (Right foot). Negative for dizziness and headaches.  Hematological:  Does not bruise/bleed easily.  Psychiatric/Behavioral:  The patient is not nervous/anxious.   Blood pressure (!) 146/64, pulse 78, temperature 97.7 F (36.5 C), temperature source Oral, resp. rate 16, height 5\' 8"  (1.727 m), weight 89.3 kg, SpO2 100 %. Physical Exam Constitutional:      General: He is not in acute distress.    Appearance: He is well-developed. He is not diaphoretic.  HENT:     Head: Normocephalic and atraumatic.  Eyes:     General: No scleral icterus.       Right eye: No discharge.        Left eye: No discharge.     Conjunctiva/sclera:  Conjunctivae normal.  Cardiovascular:     Rate and Rhythm: Normal rate and regular rhythm.  Pulmonary:     Effort: Pulmonary effort is normal. No respiratory distress.  Musculoskeletal:     Cervical back: Normal range of motion.     Comments: RLE No traumatic wounds or rash, large posterior hip ecchymosis with induration  No knee or ankle effusion  Knee stable to varus/ valgus and anterior/posterior stress  Sens DPN paresthetic, SPN, TN absent  Motor EHL, ext, flex, evers 0/5  DP 2+, PT 2+, No significant edema  Skin:    General: Skin is warm and dry.  Neurological:     Mental Status: He is alert.  Psychiatric:        Mood and Affect: Mood normal.        Behavior: Behavior normal.    Assessment/Plan: Right thigh hematoma -- The location of the hematoma makes it risky to try operative evacuation and the chance of success is low anyways. Will plan nonoperative management with compressive dressing and holding of anticoagulants. CAM boot to aid with foot drop. Plan follow up with Dr. in 1-2 weeks.    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  The risks of surgical decompression out weight the benefits at this time.  He will use a cam boot to stabilize his ankle and hindfoot untl we can get an AFO fit.  Hold eliquis.  Compressive spica dressing of the left thigh.

## 2020-09-14 NOTE — Plan of Care (Addendum)
Transfer from [facility] discussed with Dr. Eudelia Bunch. Brandon Mendoza is a 80 year old male pmh HTN, HLD, A. Fib on Eliquis, CVA, left ICA s/p endarterectomy '13, PVD, and COPD presented after having frequent falls with right thigh pain with paresthesias.  Labs significant for WBC 13.3, hemoglobin 10.4, Na 132, BUN 45, and Cr 1.85.  CT of the right femur noted a large 250 mL hematoma in the musculature with possible area of contrast extravasation.  He was noted to have palpable pulses.  Dr. Victorino Dike of orthopedics was consulted.  Patient's leg was wrapped and Kcentra was given.  Transferring to a telemetry bed.

## 2020-09-14 NOTE — ED Notes (Signed)
Report to [facility] for transport ETA 0830

## 2020-09-14 NOTE — ED Triage Notes (Addendum)
Reports a fall on Tuesday.  Now having numbness from right knee down and pain in the upper leg.  Reports he had a shot for sciatica on Wednesday.  Reports pain started on Friday night.  During triage reports pain is actually in the right hip going down from his sciatica.  All pulses palpable in right lower ext.

## 2020-09-15 LAB — CBC
HCT: 22.4 % — ABNORMAL LOW (ref 39.0–52.0)
Hemoglobin: 7.7 g/dL — ABNORMAL LOW (ref 13.0–17.0)
MCH: 31.8 pg (ref 26.0–34.0)
MCHC: 34.4 g/dL (ref 30.0–36.0)
MCV: 92.6 fL (ref 80.0–100.0)
Platelets: 201 10*3/uL (ref 150–400)
RBC: 2.42 MIL/uL — ABNORMAL LOW (ref 4.22–5.81)
RDW: 12.6 % (ref 11.5–15.5)
WBC: 7.7 10*3/uL (ref 4.0–10.5)
nRBC: 0 % (ref 0.0–0.2)

## 2020-09-15 LAB — VITAMIN B12: Vitamin B-12: 257 pg/mL (ref 180–914)

## 2020-09-15 LAB — COMPREHENSIVE METABOLIC PANEL
ALT: 15 U/L (ref 0–44)
AST: 13 U/L — ABNORMAL LOW (ref 15–41)
Albumin: 2.3 g/dL — ABNORMAL LOW (ref 3.5–5.0)
Alkaline Phosphatase: 40 U/L (ref 38–126)
Anion gap: 5 (ref 5–15)
BUN: 29 mg/dL — ABNORMAL HIGH (ref 8–23)
CO2: 22 mmol/L (ref 22–32)
Calcium: 7.9 mg/dL — ABNORMAL LOW (ref 8.9–10.3)
Chloride: 105 mmol/L (ref 98–111)
Creatinine, Ser: 1.29 mg/dL — ABNORMAL HIGH (ref 0.61–1.24)
GFR, Estimated: 56 mL/min — ABNORMAL LOW (ref >60)
Glucose, Bld: 109 mg/dL — ABNORMAL HIGH (ref 70–99)
Potassium: 4.5 mmol/L (ref 3.5–5.1)
Sodium: 132 mmol/L — ABNORMAL LOW (ref 135–145)
Total Bilirubin: 0.8 mg/dL (ref 0.3–1.2)
Total Protein: 4.5 g/dL — ABNORMAL LOW (ref 6.5–8.1)

## 2020-09-15 LAB — PREPARE RBC (CROSSMATCH)

## 2020-09-15 LAB — HEMOGLOBIN AND HEMATOCRIT, BLOOD
HCT: 22.4 % — ABNORMAL LOW (ref 39.0–52.0)
HCT: 24.1 % — ABNORMAL LOW (ref 39.0–52.0)
Hemoglobin: 7.6 g/dL — ABNORMAL LOW (ref 13.0–17.0)
Hemoglobin: 8.3 g/dL — ABNORMAL LOW (ref 13.0–17.0)

## 2020-09-15 LAB — PHOSPHORUS: Phosphorus: 2.7 mg/dL (ref 2.5–4.6)

## 2020-09-15 LAB — MAGNESIUM: Magnesium: 1.9 mg/dL (ref 1.7–2.4)

## 2020-09-15 MED ORDER — SODIUM CHLORIDE 0.9% IV SOLUTION: Freq: Once | INTRAVENOUS | Status: AC

## 2020-09-15 NOTE — Progress Notes (Signed)
TM PROGRESS NOTE    Brandon Mendoza  UUV:[id number] DOB: [birth year] DOA: 09/14/2020 PCP: Harold BarbanPearson, Darren J, MD  Brief Narrative:  This 80 years old male with PMH significant for HTN, HLD, paroxysmal A. fib on Eliquis, PVD, CVA, left ICA status post endarterectomy in 2013 presents in the ED with complaints of right leg pain. Patient reports recent fall with right thigh pain with paresthesia. Patient continued to take Eliquis. In ED CT of right femur noted large 250 mL hematoma in the musculature with possible area of contrast extravasation.  He was noted to have palpable pulses.  Patient was given Kcentra and Eliquis was discontinued.Orthopedics was consulted,  recommended nonoperative management with compressive dressings and holding of anticoagulants.    Assessment & Plan:   Principal Problem:   Thigh hematoma Active Problems:   OSA on CPAP   Normocytic anemia   Leukocytosis   AKI (acute kidney injury) (HCC)   AF (paroxysmal atrial fibrillation) (HCC)   Chronic anticoagulation   Hyponatremia  Right thigh hematoma secondary to fall:  Patient reports recent fall on his buttock on the right side 6 days ago.   He is on Eliquis and had continued to take this medication as prescribed.   Patient developed progressively worsening pain on the right leg over the last 4 days.   CT imaging shows large hematoma of the right thigh. Patient was given Kcentra and Eliquis was discontinued. Continue adequate pain control with hydrocodone and morphine. Orthopedics was consulted,  recommended nonoperative management with compressive dressings and holding of anticoagulants.  Leukocytosis:  Resolved.  This could be reactive.  Normocytic anemia: Acute.   Hemoglobin 10.4 g/dL on admission.   Possible extravasation noted on CT imaging.  Last available hemoglobin was 15.9 g/dL in 47/425911/2021 on care everywhere. Continue to monitor and transfuse if Hb drops below 8.0. Transfuse 1 PRBC.   Acute kidney injury:  > Improving Creatinine 1.83  on admission.  Baseline Creatinine 1.23 mg/dL.   Continue IV hydration.  Avoid nephrotoxic medications. Recheck labs in the morning.   Hyponatremia: Sodium 132 on admission.  -Recheck sodium levels in a.m.   Paroxysmal atrial fibrillation:  He remains in sinus rhythm.  He was given Kcentra for reversal given right thigh hematoma. -Hold Eliquis -Continue flecainide.   Essential hypertension:  Continue home blood pressure regimen   Disequilibrium/frequent falls: PT and OT evaluation  Peripheral vascular disease History of CVA s/p left carotid endarterectomy Continue statin   Anxiety Continue Zoloft and Xanax as needed   Alcohol abuse:  Patient reported drinking 2-3 martinis per day on average, but reports that he just recently stopped few days ago. CIWA protocols with Ativan as needed   OSA on CPAP:  Patient reports compliance with CPAP. Continue CPAP nightly    DVT prophylaxis: SCDs Code Status: Full code. Family Communication: No family at bed side. Disposition Plan:   Status is: Inpatient  Remains inpatient appropriate because:Inpatient level of care appropriate due to severity of illness  Dispo: The patient is from: Home              Anticipated d/c is to: Home              Patient currently is not medically stable to d/c.   Difficult to place patient No  Consultants:  Orthopeadics  Procedures:  Antimicrobials:   Anti-infectives (From admission, onward)    None        Subjective: Patient was seen and examined at bedside.  Overnight events noted.   Patient reports having right thigh tightness and pain.  He reports numbness in the right foot.  Objective: Vitals:   09/14/20 1737 09/14/20 2020 09/15/20 0415 09/15/20 1309  BP: (!) 139/42 (!) 138/49 (!) 128/51 (!) 121/47  Pulse: 76 73 67 77  Resp: 18 18 18 19   Temp: 97.9 F (36.6 C) 99.6 F (37.6 C) 98.4 F (36.9 C) 98.1 F (36.7 C)  TempSrc: Oral Oral Oral Oral   SpO2: 95% 96% 98% 97%  Weight:      Height:        Intake/Output Summary (Last 24 hours) at 09/15/2020 1449 Last data filed at 09/15/2020 0930 Gross per 24 hour  Intake 1128.6 ml  Output 950 ml  Net 178.6 ml   Filed Weights   09/14/20 0252 09/14/20 0647  Weight: 87.1 kg 89.3 kg    Examination:  General exam: Appears calm and comfortable, reports pain and tightness in the right leg with numbness in the foot. Respiratory system: Clear to auscultation. Respiratory effort normal. Cardiovascular system: S1 & S2 heard, RRR. No JVD, murmurs, rubs, gallops or clicks. No pedal edema. Gastrointestinal system: Abdomen is nondistended, soft and nontender. No organomegaly or masses felt. Normal bowel sounds heard. Central nervous system: Alert and oriented. No focal neurological deficits. Extremities: Right thigh tightness with bruises.  Tenderness noted. Skin: No rashes, lesions or ulcers Psychiatry: Judgement and insight appear normal. Mood & affect appropriate.     Data Reviewed: I have personally reviewed following labs and imaging studies  CBC: Recent Labs  Lab 09/14/20 0329 09/14/20 1059 09/15/20 0154  WBC 13.3*  --  7.7  NEUTROABS 9.8*  --   --   HGB 10.4* 9.6* 7.7*  HCT 29.4* 28.3* 22.4*  MCV 89.1  --  92.6  PLT 279  --  201   Basic Metabolic Panel: Recent Labs  Lab 09/14/20 0329 09/15/20 0154  NA 132* 132*  K 4.5 4.5  CL 100 105  CO2 21* 22  GLUCOSE 185* 109*  BUN 45* 29*  CREATININE 1.83* 1.29*  CALCIUM 8.6* 7.9*  MG  --  1.9  PHOS  --  2.7   GFR: Estimated Creatinine Clearance: 48.8 mL/min (A) (by C-G formula based on SCr of 1.29 mg/dL (H)). Liver Function Tests: Recent Labs  Lab 09/15/20 0154  AST 13*  ALT 15  ALKPHOS 40  BILITOT 0.8  PROT 4.5*  ALBUMIN 2.3*   No results for input(s): LIPASE, AMYLASE in the last 168 hours. No results for input(s): AMMONIA in the last 168 hours. Coagulation Profile: No results for input(s): INR, PROTIME in  the last 168 hours. Cardiac Enzymes: Recent Labs  Lab 09/14/20 1048  CKTOTAL 30*   BNP (last 3 results) No results for input(s): PROBNP in the last 8760 hours. HbA1C: No results for input(s): HGBA1C in the last 72 hours. CBG: No results for input(s): GLUCAP in the last 168 hours. Lipid Profile: No results for input(s): CHOL, HDL, LDLCALC, TRIG, CHOLHDL, LDLDIRECT in the last 72 hours. Thyroid Function Tests: No results for input(s): TSH, T4TOTAL, FREET4, T3FREE, THYROIDAB in the last 72 hours. Anemia Panel: Recent Labs    09/15/20 0154  VITAMINB12 257   Sepsis Labs: No results for input(s): PROCALCITON, LATICACIDVEN in the last 168 hours.  Recent Results (from the past 240 hour(s))  Resp Panel by RT-PCR (Flu A&B, Covid) Nasopharyngeal Swab     Status: None   Collection Time: 09/14/20  5:35 AM  Specimen: Nasopharyngeal Swab; Nasopharyngeal(NP) swabs in vial transport medium  Result Value Ref Range Status   SARS Coronavirus 2 by RT PCR NEGATIVE NEGATIVE Final    Comment: (NOTE) SARS-CoV-2 target nucleic acids are NOT DETECTED.  The SARS-CoV-2 RNA is generally detectable in upper respiratory specimens during the acute phase of infection. The lowest concentration of SARS-CoV-2 viral copies this assay can detect is 138 copies/mL. A negative result does not preclude SARS-Cov-2 infection and should not be used as the sole basis for treatment or other patient management decisions. A negative result may occur with  improper specimen collection/handling, submission of specimen other than nasopharyngeal swab, presence of viral mutation(s) within the areas targeted by this assay, and inadequate number of viral copies(<138 copies/mL). A negative result must be combined with clinical observations, patient history, and epidemiological information. The expected result is Negative.  Fact Sheet for Patients:  BloggerCourse.com  Fact Sheet for Healthcare  Providers:  SeriousBroker.it  This test is no t yet approved or cleared by the [place] FDA and  has been authorized for detection and/or diagnosis of SARS-CoV-2 by FDA under an Emergency Use Authorization (EUA). This EUA will remain  in effect (meaning this test can be used) for the duration of the COVID-19 declaration under Section 564(b)(1) of the Act, 21 U.S.C.section 360bbb-3(b)(1), unless the authorization is terminated  or revoked sooner.       Influenza A by PCR NEGATIVE NEGATIVE Final   Influenza B by PCR NEGATIVE NEGATIVE Final    Comment: (NOTE) The Xpert Xpress SARS-CoV-2/FLU/RSV plus assay is intended as an aid in the diagnosis of influenza from Nasopharyngeal swab specimens and should not be used as a sole basis for treatment. Nasal washings and aspirates are unacceptable for Xpert Xpress SARS-CoV-2/FLU/RSV testing.  Fact Sheet for Patients: BloggerCourse.com  Fact Sheet for Healthcare Providers: SeriousBroker.it  This test is not yet approved or cleared by the [place] FDA and has been authorized for detection and/or diagnosis of SARS-CoV-2 by FDA under an Emergency Use Authorization (EUA). This EUA will remain in effect (meaning this test can be used) for the duration of the COVID-19 declaration under Section 564(b)(1) of the Act, 21 U.S.C. section 360bbb-3(b)(1), unless the authorization is terminated or revoked.  Performed at [facility], [address] [place], [place] [address]    Radiology Studies: CT FEMUR RIGHT W WO CONTRAST  Result Date: 09/14/2020 CLINICAL DATA:  80 year old male with large hematoma after fall 6 days ago. Unrevealing hip radiograph, query Morel-Lavalle lesion. EXAM: CT OF THE LOWER RIGHT EXTREMITY WITHOUT CONTRAST TECHNIQUE: Multidetector CT imaging of the lower right extremity was performed following the standard protocol before and  during bolus administration of intravenous contrast. CONTRAST:  83mL OMNIPAQUE IOHEXOL 300 MG/ML  SOLN COMPARISON:  Right hip series 09/12/2020. FINDINGS: Visible pelvis demonstrates iliac artery atherosclerosis. Calcified peripheral vascular disease but the major arterial structures in the visible pelvis and the visible right lower extremity appear to be patent. The major venous structures in the distal right lower extremity are enhancing at the time of imaging and appear to be patent. Negative urinary bladder. Negative visible bowel in the pelvis. There is presacral stranding in the pelvis, but no free fluid. Bone windows demonstrate no right hemipelvis fracture. The proximal right femur appears intact. The right femoral shaft and visible distal femur, patella, proximal tibia and fibula appear intact. No fracture identified. No knee joint effusion. Soft tissue windows demonstrate widespread lateral subcutaneous soft tissue stranding beginning  at the flank and continuing distally in the leg. The subcutaneous stranding becomes posterior near the knee. Posterior to the proximal femur beginning just below the level of the lesser trochanter there is an oval, bilobed heterogeneous intramuscular lesion (series 10, image 145 and series 7, image 46 extending about 14 cm in length which most resembles a large intramuscular hematoma, and therea could be a small focus of contrast extravasation on series 10, image 146. Some lobular extension of this lesion toward the superficial fossa is identified (series 7, image 31), but there is no solid or cystic mass superficial to the deep fascia to suggest Morel-Lavalle lesion. All told, this lesion encompasses about 52 x 68 by 143 mm (250 mL estimated volume) furthermore, there is also some gluteal and other regional intramuscular hematoma suspected just below the pelvis such as on series 10, images 32 and 65. No soft tissue gas. No intramuscular hematoma identified more distally.  IMPRESSION: 1. No cystic or solid mass superficial to the deep fascia to suggest a Morel-Lavalle lesion, but rather multifocal intramuscular hematoma is suspected from the right buttocks region tracking posteriorly - including a large roughly 250 mL hematoma in the musculature posterior to the femoral shaft beginning just below the lesser trochanter. And there is a possible small area of contrast extravasation within that lesion. 2. No fracture identified. 3. Calcified peripheral vascular disease. Electronically Signed   By: Odessa Fleming M.D.   On: 09/14/2020 05:27    Scheduled Meds:  flecainide  100 mg Oral BID   folic acid  1 mg Oral Daily   lisinopril  10 mg Oral QHS   multivitamin with minerals  1 tablet Oral Daily   rosuvastatin  10 mg Oral QHS   sertraline  100 mg Oral Daily   sodium chloride flush  3 mL Intravenous Q12H   thiamine  100 mg Oral Daily   Or   thiamine  100 mg Intravenous Daily   Continuous Infusions:   LOS: 1 day    Time spent: 35 mins    Jesalyn Finazzo, MD [facility]   If 7PM-7AM, please contact night-coverage

## 2020-09-15 NOTE — Progress Notes (Signed)
Inpatient Rehab Admissions Coordinator:   Consult received and chart reviewed.  Pt does not have the medical necessity to support a CIR admission at this time.  Recommend f/u in a lower level of care.    Estill Dooms, PT, DPT Admissions Coordinator [phone] 09/15/20  4:04 PM

## 2020-09-15 NOTE — Progress Notes (Signed)
Patient refused CPAP hs. Patient stated he did not need CPAP tonight.

## 2020-09-15 NOTE — Progress Notes (Signed)
Orthopedic Tech Progress Note Patient Details:  Brandon Mendoza Nov 02, 1940 [id number]  Called in order to [facility] for a LOW PROFILE AFO  Patient ID: Brandon Mendoza, male   DOB: 01-17-1941, 80 y.o.   MRN: [id number]  [name] 09/15/2020, 2:54 PM

## 2020-09-15 NOTE — Progress Notes (Signed)
Subjective: HD 2 for R thigh and buttock hematoma.  Physical therapist and wife at bedside.  He denies significant pain.  He c/o numbness in the right dorsal foot and weakness at the right ankle. Pt and wife both report that he falls frequently.  He has a h/o L foot fracture last year after a fall and a thigh hematoma earlier this year.  He denies SOB, CP, dizziness.  Objective: Vital signs in last 24 hours: Temp:  [97.9 F (36.6 C)-99.6 F (37.6 C)] 98.1 F (36.7 C) (06/21 1309) Pulse Rate:  [67-77] 77 (06/21 1309) Resp:  [18-19] 19 (06/21 1309) BP: (121-139)/(42-51) 121/47 (06/21 1309) SpO2:  [95 %-98 %] 97 % (06/21 1309)  Intake/Output from previous day: 06/20 0701 - 06/21 0700 In: 1048.6 [P.O.:240; I.V.:648.6; IV Piggyback:160] Out: 1550 [Urine:1550] Intake/Output this shift: Total I/O In: 240 [P.O.:240] Out: -   Recent Labs    09/14/20 0329 09/14/20 1059 09/15/20 0154  HGB 10.4* 9.6* 7.7*   Recent Labs    09/14/20 0329 09/14/20 1059 09/15/20 0154  WBC 13.3*  --  7.7  RBC 3.30*  --  2.42*  HCT 29.4* 28.3* 22.4*  PLT 279  --  201   Recent Labs    09/14/20 0329 09/15/20 0154  NA 132* 132*  K 4.5 4.5  CL 100 105  CO2 21* 22  BUN 45* 29*  CREATININE 1.83* 1.29*  GLUCOSE 185* 109*  CALCIUM 8.6* 7.9*   No results for input(s): LABPT, INR in the last 72 hours.  PE:  wn wd male in nad.  R tib ant, EHL and EDL with 2/5 strength.  Peroneals 2/5.  Gait is backnee per PT.  R thigh NTTP.  Swollen but not tense.  2+ dp and pt pusles.  Dminished sens to LT in the superficial and deep peroneal n dist.    Assessment/Plan: R thigh hematoma with sciatic n compression - I explained the nature of the condition to the patient and his wife in detail.  I still believe the risks of surgical decompression outweigh the risks.  He has been off Eliquis for the last two days and we'll continue to hold.  We'll re apply the thigh dressing as a snug hip spica compressive wrap.  PT  recommends CIR consultation since his gait is quite limited from his base line knee instability coupled with his acute foot drop.  His hgb is a little lower today but he is currently asymptomatic.  I'll defer decision regarding transfusion to Dr. Lucianne Muss since pt has a significant cardiac history.  He and his wife understand this plan and agree.     Toni Arthurs 09/15/2020, 2:27 PM

## 2020-09-15 NOTE — Evaluation (Signed)
Occupational Therapy Evaluation Patient Details Name: Brandon Mendoza MRN: [id number] DOB: 10-23-1940 Today's Date: 09/15/2020    History of Present Illness Brandon Mendoza is a 80 y.o. male admitted on 09/14/20 after taking his lawnmower out of the car when he lost his balance falling onto his buttock on the right side.  Pt dx with R thigh hematoma with sciatic nerve compression with resultant numbess and foot drop.  His eliquis has been put on hold and they are minitoring his hemaglobin closely.  He is not considered a surgical debridement candidate at this time and has no acute fractures.   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   Clinical Impression   Pt presents with decline in function and safety with ADLs and ADL mobility with impaired strength, balance and endurance. Pt c/o of numbness in R LE. PTA pt lived at home with his wife and was Ind with ADLs/selfcare, IADLs, mobility and was driving. Pt currently requires min - mod A with mobility using RW, Mod - max A with LB ADLs, set up sup wit UB ADLs and Poor safety awareness. Pt would benefit from acute OT services to address impairments to maximize level of function and safety    Follow Up Recommendations       Equipment Recommendations  3 in 1 bedside commode;Other (comment) (RW, reacher, TBD at next venue of care)    Recommendations for Other Services PT consult;Rehab consult     Precautions / Restrictions Precautions Precautions: Fall Required Braces or Orthoses: Other Brace Other Brace: R LE CAM walker boot per ortho PA until he can be fitted for AFO Restrictions Weight Bearing Restrictions: No      Mobility Bed Mobility               General bed mobility comments: pt in recliner upon arrival    Transfers Overall transfer level: Needs assistance Equipment used: Rolling walker (2  wheeled) Transfers: Sit to/from [employer] Sit to Stand: Min assist Stand pivot transfers: Min assist       General transfer comment: min A for sit - stand from recliner, mod - min  A to pivot around with RW, max verbal and physical cues to safely use RW. Pt unsteady with quick movements    Balance Overall balance assessment: Needs assistance Sitting-balance support: No upper extremity supported;Feet unsupported Sitting balance-Leahy Scale: Good     Standing balance support: Bilateral upper extremity supported;During functional activity Standing balance-Leahy Scale: Poor                             ADL either performed or assessed with clinical judgement   ADL Overall ADL's : Needs assistance/impaired Eating/Feeding: Set up;Independent;Sitting   Grooming: Wash/dry hands;Wash/dry face;Min guard;Standing   Upper Body Bathing: Set up;Supervision/ safety;Sitting   Lower Body Bathing: Moderate assistance   Upper Body Dressing : Supervision/safety;Set up;Sitting   Lower Body Dressing: Maximal assistance   Toilet Transfer: Moderate assistance;Minimal assistance;Ambulation;RW;Cueing for safety;Cueing for sequencing Toilet Transfer Details (indicate cue type and reason): simulated to recliner Toileting- Clothing Manipulation and Hygiene: Moderate assistance;Sit to/from stand       Functional mobility during ADLs: Moderate assistance;Minimal assistance;Rolling walker;Cueing for safety       Vision Baseline Vision/History: Wears glasses Wears Glasses: Reading only Patient Visual Report: No change from baseline  Perception     Praxis      Pertinent Vitals/Pain Pain Assessment: No/denies pain Faces Pain Scale: No hurt Pain Location: R LE feels numb Pain Descriptors / Indicators: Numbness Pain Intervention(s): Limited activity within patient's tolerance;Monitored during session;Repositioned     Hand Dominance Right   Extremity/Trunk  Assessment Upper Extremity Assessment Upper Extremity Assessment: Overall WFL for tasks assessed   Lower Extremity Assessment Lower Extremity Assessment: Defer to PT evaluation   Cervical / Trunk Assessment Cervical / Trunk Assessment: Normal   Communication Communication Communication: No difficulties   Cognition Arousal/Alertness: Awake/alert Behavior During Therapy: WFL for tasks assessed/performed Overall Cognitive Status: Within Functional Limits for tasks assessed                                     General Comments       Exercises     Shoulder Instructions      Home Living Family/patient expects to be discharged to:: Private residence Living Arrangements: Spouse/significant other Available Help at Discharge: Family;Available 24 hours/day Type of Home: House Home Access: Stairs to enter [employer] of Steps: 4 from garage Entrance Stairs-Rails: Right;Left Home Layout: Two level;Able to live on main level with bedroom/bathroom     Bathroom Shower/Tub: Tub/shower unit;Walk-in shower   Bathroom Toilet: Handicapped height                Prior Functioning/Environment Level of Independence: Independent                 OT Problem List: Decreased strength;Impaired balance (sitting and/or standing);Decreased safety awareness;Decreased activity tolerance;Decreased coordination;Decreased knowledge of use of DME or AE;Impaired sensation      OT Treatment/Interventions: Self-care/ADL training;DME and/or AE instruction;Therapeutic activities;Balance training;Therapeutic exercise;Neuromuscular education;Patient/family education    OT Goals(Current goals can be found in the care plan section) Acute Rehab OT Goals Patient Stated Goal: go home OT Goal Formulation: With patient/family Time For Goal Achievement: 09/29/20 ADL Goals Pt Will Perform Grooming: with min guard assist;standing Pt Will Perform Upper Body Bathing: with  set-up;sitting Pt Will Perform Lower Body Bathing: with min assist;sitting/lateral leans Pt Will Perform Upper Body Dressing: with set-up;sitting Pt Will Transfer to Toilet: with min assist;with min guard assist;ambulating Pt Will Perform Toileting - Clothing Manipulation and hygiene: with min assist;with min guard assist;sit to/from stand  OT Frequency: Min 2X/week   Barriers to D/C:            Co-evaluation              AM-PAC OT "6 Clicks" Daily Activity     Outcome Measure Help from another person eating meals?: None Help from another person taking care of personal grooming?: A Little Help from another person toileting, which includes using toliet, bedpan, or urinal?: A Lot Help from another person bathing (including washing, rinsing, drying)?: A Lot Help from another person to put on and taking off regular upper body clothing?: None Help from another person to put on and taking off regular lower body clothing?: A Lot 6 Click Score: 17   End of Session Equipment Utilized During Treatment: Gait belt;Rolling walker;Other (comment) (R CAM boot)  Activity Tolerance: Patient limited by fatigue;Other (comment) (anxious whwn up walking) Patient left: in chair;with call bell/phone within reach;with family/visitor present  OT Visit Diagnosis: Unsteadiness on feet (R26.81);History of falling (Z91.81);Muscle weakness (generalized) (M62.81);Other abnormalities of gait and mobility (R26.89)  Time: 9767-3419 OT Time Calculation (min): 25 min Charges:  OT General Charges $OT Visit: 1 Visit OT Evaluation $OT Eval Moderate Complexity: 1 Mod OT Treatments $Self Care/Home Management : 8-22 mins    Galen Manila 09/15/2020, 3:16 PM

## 2020-09-15 NOTE — Evaluation (Addendum)
Physical Therapy Evaluation Patient Details Name: JADON HARBAUGH MRN: [id number] DOB: [birth year] Today's Date: 09/15/2020   History of Present Illness  LANDER ESLICK is a 80 y.o. male admitted on 09/14/20 after taking his lawnmower out of the car when he lost his balance falling onto his buttock on the right side.  Pt dx with R thigh hematoma with sciatic nerve compression with resultant numbess and foot drop.  His eliquis has been put on hold and they are minitoring his hemaglobin closely.  He is not considered a surgical debridement candidate at this time and has no acute fractures.   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  Clinical Impression  Pt did better with DF assist wrap to walk to the bathroom than with CAM boot. Dr. Victorino Dike in room during session and ok with a low profile AFO.  Pt also with significant R knee weakness in stance (knee hyperextension moment and buckling/snapping back).  He may need a more substantial AFO than the low profile one to help with the knee hyperextension, but PT to meet with orthotist tomorrow to see what would be best for him.  He is a very high fall risk and would benefit from post acute rehab if able to help get him back stable on his feet before d/c home with his wife.   PT to follow acutely for deficits listed below.       Follow Up Recommendations SNF (CIR denied)    Equipment Recommendations  Other (comment);Rolling walker with 5" wheels (AFO for foot drop, possibly one to help with knee hyperextension as well.  to meet with orthotist tomorrow.)    Recommendations for Other Services       Precautions / Restrictions Precautions Precautions: Fall Required Braces or Orthoses: Other Brace Other Brace: R LE CAM walker boot per ortho PA until he can be fitted for AFO Restrictions Weight Bearing Restrictions: No      Mobility  Bed  Mobility Overal bed mobility: Modified Independent                  Transfers Overall transfer level: Needs assistance Equipment used: Rolling walker (2 wheeled) Transfers: Sit to/from Stand Sit to Stand: Min assist Stand pivot transfers: Min assist       General transfer comment: Heavy min assist to come to standing to RW.  Min guard to close supervision to stablize in static standing with legs supported against the bed.  Heavier assist from lower toilet  Ambulation/Gait Ambulation/Gait assistance: Mod assist Gait Distance (Feet): 15 Feet Assistive device: Rolling walker (2 wheeled) Gait Pattern/deviations: Decreased dorsiflexion - right;Steppage     General Gait Details: PT donned pt's shoes and put DF assist wrap around pt's right ankle to help with foot drop unaware that when he stood he also has significant weakness in stance on his right knee (hyperextension moment in stance).  cues for safe proximity to RW and LE sequencing, however, pt not really implementing suggestions and moving far to fast to be safe.  He is an extremely high fall risk.  Stairs            Wheelchair Mobility    Modified Rankin (Stroke Patients Only)       Balance Overall balance assessment: Needs assistance Sitting-balance support: Feet supported;No upper extremity supported Sitting balance-Leahy Scale: Fair     Standing balance support: Bilateral upper extremity supported  Standing balance-Leahy Scale: Poor Standing balance comment: pt reliant on bil UE support and physical support from PT.                             Pertinent Vitals/Pain Pain Assessment: Faces Faces Pain Scale: Hurts even more Pain Location: right thigh and buttocks Pain Descriptors / Indicators: Grimacing;Guarding Pain Intervention(s): Limited activity within patient's tolerance;Monitored during session;Repositioned    Home Living Family/patient expects to be discharged to:: Private  residence Living Arrangements: Spouse/significant other Available Help at Discharge: Family;Available 24 hours/day Type of Home: House Home Access: Stairs to enter Entrance Stairs-Rails: Right;Left Entrance Stairs-Number of Steps: 4 from garage Home Layout: Two level;Able to live on main level with bedroom/bathroom        Prior Function Level of Independence: Independent               Hand Dominance   Dominant Hand: Right    Extremity/Trunk Assessment   Upper Extremity Assessment Upper Extremity Assessment: Defer to OT evaluation    Lower Extremity Assessment Lower Extremity Assessment: RLE deficits/detail RLE Deficits / Details: right leg with numbess on the top of his foot, 1/5 DF, weak plantarflexion, knee 3-/5 with hyperextension moment in stance "oh yeah, that knee gives out on me a lot" when asked if his knee is weaker than usual.    Cervical / Trunk Assessment Cervical / Trunk Assessment: Other exceptions Cervical / Trunk Exceptions: pt has been getting spinal injections  Communication   Communication: No difficulties  Cognition Arousal/Alertness: Awake/alert Behavior During Therapy: Impulsive                                   General Comments: He is very impulsive, despite being able to report his deficits (numbness in his foot, inability to lift foot, weakness at his knee) he moves too quickly to be safe and continues to do things like lift a lawnmower out of his car despite significant fall history in the past year.      General Comments General comments (skin integrity, edema, etc.): Dr. Victorino Dike in room during my session and ok with ordering some kind of light AFO for foot drop.  CAM boot not used as it was cumbersome during OT session and seemed to be more of an obstacle than help.  He asked me to apply a hip spica wrap to the man's right thigh and hip and I was able to do that with him in standing.    Exercises     Assessment/Plan     PT Assessment Patient needs continued PT services  PT Problem List Decreased strength;Decreased activity tolerance;Decreased balance;Decreased mobility;Decreased knowledge of use of DME;Decreased safety awareness;Impaired sensation;Pain       PT Treatment Interventions DME instruction;Gait training;Stair training;Functional mobility training;Therapeutic activities;Therapeutic exercise;Balance training;Patient/family education;Modalities    PT Goals (Current goals can be found in the Care Plan section)  Acute Rehab PT Goals Patient Stated Goal: go home PT Goal Formulation: With patient Time For Goal Achievement: 09/29/20 Potential to Achieve Goals: Good    Frequency Min 3X/week   Barriers to discharge        Co-evaluation               AM-PAC PT "6 Clicks" Mobility  Outcome Measure Help needed turning from your back to your side while in a flat bed without using bedrails?:  A Little Help needed moving from lying on your back to sitting on the side of a flat bed without using bedrails?: A Little Help needed moving to and from a bed to a chair (including a wheelchair)?: A Little Help needed standing up from a chair using your arms (e.g., wheelchair or bedside chair)?: A Little Help needed to walk in hospital room?: A Lot Help needed climbing 3-5 steps with a railing? : A Lot 6 Click Score: 16    End of Session Equipment Utilized During Treatment: Gait belt Activity Tolerance: Patient tolerated treatment well Patient left: in bed;with call bell/phone within reach;with bed alarm set   PT Visit Diagnosis: Muscle weakness (generalized) (M62.81);Difficulty in walking, not elsewhere classified (R26.2);Pain Pain - Right/Left: Right Pain - part of body: Leg    Time: 1400-1450 PT Time Calculation (min) (ACUTE ONLY): 50 min   Charges:   PT Evaluation $PT Eval Moderate Complexity: 1 Mod PT Treatments $Gait Training: 8-22 mins $Therapeutic Activity: 8-22 mins        Corinna Capra, PT, DPT  Acute Rehabilitation Ortho Tech Supervisor [phone] pager [phone]) [phone] office

## 2020-09-16 DIAGNOSIS — T148XXA Other injury of unspecified body region, initial encounter: Secondary | ICD-10-CM | POA: Diagnosis present

## 2020-09-16 LAB — TYPE AND SCREEN
ABO/RH(D): A NEG
Antibody Screen: NEGATIVE
Unit division: 0

## 2020-09-16 LAB — CBC
HCT: 24.3 % — ABNORMAL LOW (ref 39.0–52.0)
Hemoglobin: 8.3 g/dL — ABNORMAL LOW (ref 13.0–17.0)
MCH: 30.6 pg (ref 26.0–34.0)
MCHC: 34.2 g/dL (ref 30.0–36.0)
MCV: 89.7 fL (ref 80.0–100.0)
Platelets: 210 10*3/uL (ref 150–400)
RBC: 2.71 MIL/uL — ABNORMAL LOW (ref 4.22–5.81)
RDW: 14.4 % (ref 11.5–15.5)
WBC: 8.1 10*3/uL (ref 4.0–10.5)
nRBC: 0 % (ref 0.0–0.2)

## 2020-09-16 LAB — BASIC METABOLIC PANEL
Anion gap: 7 (ref 5–15)
BUN: 25 mg/dL — ABNORMAL HIGH (ref 8–23)
CO2: 24 mmol/L (ref 22–32)
Calcium: 8.4 mg/dL — ABNORMAL LOW (ref 8.9–10.3)
Chloride: 104 mmol/L (ref 98–111)
Creatinine, Ser: 1.13 mg/dL (ref 0.61–1.24)
GFR, Estimated: >60 mL/min (ref >60)
Glucose, Bld: 91 mg/dL (ref 70–99)
Potassium: 4.2 mmol/L (ref 3.5–5.1)
Sodium: 135 mmol/L (ref 135–145)

## 2020-09-16 LAB — BPAM RBC
Blood Product Expiration Date: 202206272359
ISSUE DATE / TIME: 202206211659
Unit Type and Rh: 600

## 2020-09-16 LAB — PHOSPHORUS: Phosphorus: 2.6 mg/dL (ref 2.5–4.6)

## 2020-09-16 LAB — MAGNESIUM: Magnesium: 2 mg/dL (ref 1.7–2.4)

## 2020-09-16 NOTE — Hospital Course (Signed)
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 Arbie Cookey at that time Ethanol habituation (still drinks) Prior tobacco quit 2006 Possible OSA, HTN, HLD Paroxysmal a flutter on Eliquis/flecainide (bradycardic with beta-blocker) follows @ [facility] point with Dr. Rolley Sims was for Dr. Lajuana Ripple to place implantable loop recorder?  Asystole causing frequent falls-also transitioned downward 08/12/2020 off of Xanax?  Initially presented [facility] 6/18 with fall probably on 6/13 --> Had recent spine injection with pain management Dr. 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 Victorino Dike consulted-felt risks outweigh benefits of surgical decompression   Sodium 135 potassium 4.2 BUNs/creatinine 29/1.2->25/1.1 Hemoglobin nadir 7.6-->8.3 White count 8.1

## 2020-09-16 NOTE — Progress Notes (Signed)
Subjective:     Patient reports pain as mild.  Reports that the motion in his foot seems to be a little better today.  Notes that he obtained his AFO and was able to walk better with it in comparison to the CAM boot.  States that he is keeping the compression on this R thigh intact.  Objective:   VITALS:  Temp:  [98.2 F (36.8 C)-99.2 F (37.3 C)] 98.3 F (36.8 C) (06/22 1440) Pulse Rate:  [60-72] 60 (06/22 1440) Resp:  [18] 18 (06/22 1440) BP: (127-142)/(50-62) 127/62 (06/22 1440) SpO2:  [95 %-99 %] 99 % (06/22 1440)  General: WDWN patient in NAD. Psych:  Appropriate mood and affect. Neuro:  A&O x 3, Moving all extremities, sensation subjectively diminished to light touch along the medial/lateral plantar N distributions. HEENT:  EOMs intact Chest:  Even non-labored respirations Skin:  Dressing C/D/I, no rashes or lesions Extremities: warm/dry, moderate edema to R lateral thigh that is moderately firm, erythema or echymosis.  Non-tender to palpation to R thigh.  No lymphadenopathy. Pulses: Popliteus 2+ MSK:  ROM: Ankle DF to 5 degrees shy of neutral, MMT: able to perform quad set   LABS Recent Labs    09/14/20 1059 09/15/20 0154 09/15/20 1510 09/15/20 2251 09/16/20 0155  HGB 9.6* 7.7* 7.6* 8.3* 8.3*  WBC  --  7.7  --   --  8.1  PLT  --  201  --   --  210   Recent Labs    09/15/20 0154 09/16/20 0155  NA 132* 135  K 4.5 4.2  CL 105 104  CO2 22 24  BUN 29* 25*  CREATININE 1.29* 1.13  GLUCOSE 109* 91   No results for input(s): LABPT, INR in the last 72 hours.   Assessment/Plan:     Right thigh hematoma and resultant drop foot.  WBAT R LE in AFO Up with therapy Hgb appears to have stabilized.  Will continue with closed treatment. Will anticipate that drop foot continues to improve as patient's hematoma continues to subside. Right hip spica wrap at all times, unless bathing. Disp: SNF Plan for 2 week outpatient visit with Dr. Victorino Dike.  Alfredo Martinez  PA-C [facility] Office:  [phone]

## 2020-09-16 NOTE — Progress Notes (Signed)
RT note. Patient refuse cpap for the night 

## 2020-09-16 NOTE — NC FL2 (Signed)
[place] MEDICAID FL2 LEVEL OF CARE SCREENING TOOL     IDENTIFICATION  Patient Name: Brandon Mendoza DOBIE Birthdate: [birth year] Sex: male Admission Date (Current Location): 09/14/2020  [facility] and [place] Number:  Producer, television/film/video and Address:  [facility] [place], [address], [place], [place] [address]      Provider Number: [id number]  Attending Physician Name and Address:  Rhetta Mura, MD  Relative Name and Phone Number:       Current Level of Care: Hospital Recommended Level of Care: Skilled Nursing Facility Prior Approval Number:    Date Approved/Denied:   PASRR Number: [id number]  Discharge Plan: SNF    Current Diagnoses: Patient Active Problem List   Diagnosis Date Noted   Hematoma 09/16/2020   Thigh hematoma 09/14/2020   Normocytic anemia 09/14/2020   Leukocytosis 09/14/2020   AKI (acute kidney injury) (HCC) 09/14/2020   AF (paroxysmal atrial fibrillation) (HCC) 09/14/2020   Chronic anticoagulation 09/14/2020   Hyponatremia 09/14/2020   OSA on CPAP 06/20/2018   Anxiety 04/10/2014   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   History of carotid endarterectomy 04/10/2014   Essential hypertension 04/10/2014   Aftercare following surgery of the circulatory system, NEC 03/13/2013   Numbness- Left neck 03/13/2013   Occlusion and stenosis of carotid artery without mention of cerebral infarction 12/15/2011   HTN (hypertension), malignant 12/01/2011   Habitual alcohol use 12/01/2011   Carotid artery stenosis, symptomatic 11/30/2011   CVA (cerebral infarction) 11/29/2011   Hyperlipidemia     Orientation RESPIRATION BLADDER Height & Weight     Self, Time, Situation, Place  Normal Continent Weight: 196 lb 13.9 oz (89.3 kg) Height:  5\' 8"  (172.7 cm)  BEHAVIORAL SYMPTOMS/MOOD NEUROLOGICAL BOWEL NUTRITION STATUS      Continent Diet (See DC summary)  AMBULATORY STATUS COMMUNICATION OF NEEDS Skin    Limited Assist Verbally Normal                       Personal Care Assistance Level of Assistance  Feeding, Bathing, Dressing Bathing Assistance: Limited assistance Feeding assistance: Independent Dressing Assistance: Limited assistance     Functional Limitations Info  Sight, Hearing, Speech Sight Info: Adequate Hearing Info: Adequate Speech Info: Adequate    SPECIAL CARE FACTORS FREQUENCY  PT (By licensed PT), OT (By licensed OT)     PT Frequency: 5x a week OT Frequency: 5x a week            Contractures Contractures Info: Not present    Additional Factors Info  Code Status, Allergies Code Status Info: Full Allergies Info: NKA           Current Medications (09/16/2020):  This is the current hospital active medication list Current Facility-Administered Medications  Medication Dose Route Frequency Provider Last Rate Last Admin   acetaminophen (TYLENOL) tablet 650 mg  650 mg Oral Q6H PRN 09/18/2020 A, MD   650 mg at 09/15/20 2019   Or   acetaminophen (TYLENOL) suppository 650 mg  650 mg Rectal Q6H PRN 2020, MD       ALPRAZolam Clydie Braun) tablet 0.25 mg  0.25 mg Oral QHS PRN Prudy Feeler A, MD   0.25 mg at 09/15/20 2134   flecainide (TAMBOCOR) tablet 100 mg  100 mg Oral BID 2135 A, MD   100 mg at 09/16/20 0834   folic acid (FOLVITE) tablet 1 mg  1 mg Oral Daily 09/18/20,  Rondell A, MD   1 mg at 09/16/20 0834   HYDROcodone-acetaminophen (NORCO/VICODIN) 5-325 MG per tablet 1 tablet  1 tablet Oral Q6H PRN Madelyn Flavors A, MD       lidocaine (LIDODERM) 5 % 1 patch  1 patch Transdermal Daily PRN Katrinka Blazing, Rondell A, MD       lisinopril (ZESTRIL) tablet 10 mg  10 mg Oral QHS Smith, Rondell A, MD   10 mg at 09/15/20 2133   LORazepam (ATIVAN) tablet 1-4 mg  1-4 mg Oral Q1H PRN Clydie Braun, MD       Or   LORazepam (ATIVAN) injection 1-4 mg  1-4 mg Intravenous Q1H PRN Madelyn Flavors A, MD       methocarbamol (ROBAXIN) tablet 500 mg  500 mg Oral  BID PRN Madelyn Flavors A, MD   500 mg at 09/15/20 2134   morphine 2 MG/ML injection 2 mg  2 mg Intravenous Q3H PRN Clydie Braun, MD       multivitamin with minerals tablet 1 tablet  1 tablet Oral Daily Madelyn Flavors A, MD   1 tablet at 09/16/20 0834   ondansetron (ZOFRAN) tablet 4 mg  4 mg Oral Q6H PRN Madelyn Flavors A, MD       Or   ondansetron (ZOFRAN) injection 4 mg  4 mg Intravenous Q6H PRN Smith, Rondell A, MD       rosuvastatin (CRESTOR) tablet 10 mg  10 mg Oral QHS Smith, Rondell A, MD   10 mg at 09/15/20 2133   sertraline (ZOLOFT) tablet 100 mg  100 mg Oral Daily Smith, Rondell A, MD   100 mg at 09/16/20 0834   sodium chloride flush (NS) 0.9 % injection 3 mL  3 mL Intravenous Q12H Smith, Rondell A, MD   3 mL at 09/15/20 2133   thiamine tablet 100 mg  100 mg Oral Daily Madelyn Flavors A, MD   100 mg at 09/16/20 6834     Discharge Medications: Please see discharge summary for a list of discharge medications.  Relevant Imaging Results:  Relevant Lab Results:   Additional Information SSN [id number] Covid vaccinate with booster  Jimmy Picket, LCSWA

## 2020-09-16 NOTE — TOC Initial Note (Addendum)
Transition of Care [facility]) - Initial/Assessment Note    Patient Details  Name: Brandon Mendoza MRN: [id number] Date of Birth: 1941/02/14  [facility]) CM/SW Contact:    Emeterio Reeve, [place] Phone Number: 09/16/2020, 1:05 PM  Clinical Narrative:                  CSW met with pt at bedside. CSW introduced self and explained her role at the hospital.  PT report PT he was living with home with his wife. Pt reports he was independent with mobility and ADL's. Pt was driving and going to the gym 2-3 times a week. Pt denies substance use. Pt is covid vaccinated with booster.   CSW reviewed PT/OT reccs for SNF. Pt expressed he was disappointed that he was denied for CIR but understands why. [place] and has a preference of [facility]. Pt agreed for CSW to fax out to other facilities in the area as a back up.   CSW will follow.  Expected Discharge Plan: Skilled Nursing Facility Barriers to Discharge: Continued Medical Work up   Patient Goals and CMS Choice Patient states their goals for this hospitalization and ongoing recovery are:: to get back to normal CMS Medicare.gov Compare Post Acute Care list provided to:: Patient Choice offered to / list presented to : Patient  Expected Discharge Plan and Services Expected Discharge Plan: [place]       Living arrangements for the past 2 months: Single Family Home                                      Prior Living Arrangements/Services Living arrangements for the past 2 months: Single Family Home Lives with:: Spouse Patient language and need for interpreter reviewed:: Yes Do you feel safe going back to the place where you live?: Yes      Need for Family Participation in Patient Care: Yes (Comment) Care giver support system in place?: Yes (comment)   Criminal Activity/Legal Involvement Pertinent to Current Situation/Hospitalization: No - Comment as needed  Activities of  Daily Living   ADL Screening (condition at time of admission) Patient's cognitive ability adequate to safely complete daily activities?: Yes Is the patient deaf or have difficulty hearing?: No Does the patient have difficulty seeing, even when wearing glasses/contacts?: No Does the patient have difficulty concentrating, remembering, or making decisions?: No Patient able to express need for assistance with ADLs?: Yes Does the patient have difficulty dressing or bathing?: No Independently performs ADLs?: Yes (appropriate for developmental age) Does the patient have difficulty walking or climbing stairs?: Yes Weakness of Legs: Right Weakness of Arms/Hands: None  Permission Sought/Granted Permission sought to share information with : Chartered certified accountant granted to share information with : Yes, Verbal Permission Granted     Permission granted to share info w AGENCY: SNF        Emotional Assessment Appearance:: Appears stated age Attitude/Demeanor/Rapport: Engaged Affect (typically observed): Appropriate Orientation: : Oriented to Self, Oriented to Place, Oriented to  Time, Oriented to Situation Alcohol / Substance Use: Not Applicable Psych Involvement: No (comment)  Admission diagnosis:  Blood loss anemia [D50.0] Thigh hematoma [S70.10XA] Traumatic hematoma of right thigh, initial encounter [S70.11XA] Hematoma [T14.8XXA] Patient Active Problem List   Diagnosis Date Noted   Hematoma 09/16/2020   Thigh hematoma 09/14/2020   Normocytic anemia  09/14/2020   Leukocytosis 09/14/2020   AKI (acute kidney injury) ([place]) 09/14/2020   AF (paroxysmal atrial fibrillation) ([place]) 09/14/2020   Chronic anticoagulation 09/14/2020   Hyponatremia 09/14/2020   OSA on CPAP 06/20/2018   Anxiety 04/10/2014   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, [place] [address] [place] [address] Phone: [phone] Fax: [phone]  [place] ([place], [place] [address] Phone: [phone] Fax: [phone]     Social Determinants of Health (SDOH) Interventions    Readmission Risk Interventions No flowsheet data found.  Emeterio Reeve, Latanya Presser, [place] Social Worker [phone]

## 2020-09-16 NOTE — Progress Notes (Signed)
Physical Therapy Treatment Patient Details Name: Brandon Mendoza MRN: [id number] DOB: 1940-07-13 Today's Date: 09/16/2020    History of Present Illness Brandon Mendoza is a 80 y.o. male admitted on 09/14/20 after taking his lawnmower out of the car when he lost his balance falling onto his buttock on the right side.  Pt dx with R thigh hematoma with sciatic nerve compression with resultant numbess and foot drop.  His eliquis has been put on hold and they are minitoring his hemaglobin closely.  He is not considered a surgical debridement candidate at this time and has no acute fractures.   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    PT Comments    Met prosthetist to assess AFO to help with pt's foot DF weakness and knee weakness in stance.  Pt is much improved with brace she fitted.  He does still fatigue during gait reporting knee instability after a total of ~75 feet walked (hallway and then later bathroom).  He remains appropriate for SNF level rehab and then progression to home therapy.  We initiated a HEP today that I would like to add to and review tomorrow on paper and I requested thigh high TED for his right leg.  When he gets them, I will re-do the hip spica wrap over the top of the thigh high TEDs.  PT will continue to follow acutely for safe mobility progression.   Follow Up Recommendations  SNF (CIR denied)     Equipment Recommendations  Rolling walker with 5" wheels    Recommendations for Other Services       Precautions / Restrictions Precautions Precautions: Fall Required Braces or Orthoses: Other Brace Other Brace: R LE CAM walker boot per ortho PA until he can be fitted for AFO    Mobility  Bed Mobility               General bed mobility comments: pt seated EOB with Meghan, orthotist from [employer] Overall transfer level: Needs  assistance   Transfers: Sit to/from Stand Sit to Stand: Min assist         General transfer comment: Heavy min assist to come to standing to RW.  Heavier min assist from lower toilet  Ambulation/Gait Ambulation/Gait assistance: Min assist Gait Distance (Feet): 60 Feet Assistive device: Rolling walker (2 wheeled) Gait Pattern/deviations: Decreased dorsiflexion - right;Steppage     General Gait Details: Pt was seated EOB with DF assist AFO donned and heel lift in (to help with knee hyperextension moment). Meghan orthotist was there in room helped pt apply brace and had already done a lap around the room with him).  Much improved stability with brace at both ankle for toe up and knee for stance/WB without snapping into hyper extension.  He did fatigue and increased his steppage gait pattern at the very end of gait, and then did have increased knee weakness after taking another trip to the bathroom "I feel like it is going to give out", so he still fatigues and presents as a high fall risk due to weakness on this side.   Stairs             Wheelchair Mobility    Modified Rankin (Stroke Patients Only)       Balance Overall balance assessment: Needs assistance Sitting-balance support: Feet supported;No upper extremity supported Sitting balance-Leahy Scale: Good  Standing balance support: Bilateral upper extremity supported Standing balance-Leahy Scale: Fair Standing balance comment: Can stand statically without UE support, but not long and with close supervision.                            Cognition Arousal/Alertness: Awake/alert Behavior During Therapy: Impulsive                                   General Comments: Better with impulsivity today, moving with more confidence, which I think helps.      Exercises General Exercises - Lower Extremity Ankle Circles/Pumps: AROM;Right;20 reps (encouraged these hourly) Long Arc Quad: AROM;Right;10  reps (5 second holds) Hip Flexion/Marching: AROM;Right;10 reps    General Comments General comments (skin integrity, edema, etc.): Pt's thigh edema looks significantly improved today with hip spica wrap still in place, however, his lower leg with significant increase in edema, so I wrapped him from his toes to his thigh crossing the already intact hip spica wrap.  I asked RN, Joaquim Lai to order thigh high ted hose which will be easier than all of the wraps and I will re-do hip spica wrap tomorrow over top of the thigh high TED hose.      Pertinent Vitals/Pain Pain Assessment: 0-10 Faces Pain Scale: Hurts little more Pain Location: right thigh and buttocks Pain Descriptors / Indicators: Grimacing;Guarding Pain Intervention(s): Limited activity within patient's tolerance;Monitored during session;Repositioned    Home Living                      Prior Function            PT Goals (current goals can now be found in the care plan section) Acute Rehab PT Goals Patient Stated Goal: go home Progress towards PT goals: Progressing toward goals    Frequency    Min 3X/week      PT Plan Current plan remains appropriate    Co-evaluation              AM-PAC PT "6 Clicks" Mobility   Outcome Measure  Help needed turning from your back to your side while in a flat bed without using bedrails?: A Little Help needed moving from lying on your back to sitting on the side of a flat bed without using bedrails?: A Little Help needed moving to and from a bed to a chair (including a wheelchair)?: A Little Help needed standing up from a chair using your arms (e.g., wheelchair or bedside chair)?: A Little Help needed to walk in hospital room?: A Little Help needed climbing 3-5 steps with a railing? : A Lot 6 Click Score: 17    End of Session Equipment Utilized During Treatment: Gait belt Activity Tolerance: Patient tolerated treatment well Patient left: in bed;with call bell/phone within  reach;with bed alarm set   PT Visit Diagnosis: Muscle weakness (generalized) (M62.81);Difficulty in walking, not elsewhere classified (R26.2);Pain Pain - Right/Left: Right Pain - part of body: Leg     Time: 8341-9622 PT Time Calculation (min) (ACUTE ONLY): 20 min  Charges:  $Gait Training: 8-22 mins                    Verdene Lennert, PT, DPT  Acute Rehabilitation Ortho Tech Supervisor [phone] pager [phone]) [phone] office

## 2020-09-16 NOTE — Progress Notes (Signed)
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 Arbie Cookey at that time Ethanol habituation (still drinks) Prior tobacco quit 2006 Possible OSA, HTN, HLD Paroxysmal a flutter on Eliquis/flecainide (bradycardic with beta-blocker) follows @ [facility] point with Dr. Rolley Sims was for Dr. Lajuana Ripple to place implantable loop recorder?  Asystole causing frequent falls-also transitioned downward 08/12/2020 off of Xanax?  Initially presented [facility] 6/18 with fall probably on 6/13 --> Had recent spine injection with pain management Dr. 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 Victorino Dike consulted-felt risks outweigh benefits of surgical decompression  Hospital-Problem based course  Right thigh hematoma with sciatic irritation/foot drop Kcentra given on admission Nonoperative candidate per orthopedics Dr. Victorino Dike Symptomatic management-therapy recommends skilled placement Acute blood loss anemia secondary to hematoma Transfused 1 unit PRBC 6/21 Transfusion threshold below 7.0 Anemia seems normocytic-outpatient iron studies Paroxysmal a flutter on flecainide CHADS2 score >4--has bled score >3 has had Zio patch X2 at [facility]  reviewed-patient predominantly in sinus rhythm Will need outpatient follow-[facility] Dr[facility] Leonie Man [facility] regarding risk/benefit?  When to restart DOAC versus not LICA stenosis status post endarterectomy, prior stroke 2013 previously on Plavix No current overt deficits at this time GDMT = Crestor 10, lisinopril 10 E TC Possible OSA Outpatient follow-up for retesting   DVT prophylaxis: SCDs at this time Code Status: Full Family Communication: Plan of care was discussed with patient's daughter Artis Flock on telephone [phone] Disposition:  Status is: Inpatient  Not inpatient appropriate, will call UM team and downgrade to OBS.   Dispo: The patient is from: Home              Anticipated d/c is to: SNF              Patient currently is not medically stable to d/c.   Difficult to place patient No       Consultants:  Orthopedics Dr. Victorino Dike  Procedures: None  Antimicrobials: No   Subjective:  Awake pleasant coherent-quite interactive States numbness and foot drop in right leg are not resolved but seem improved compared to prior We spent some time discussing likely outcomes  Objective: Vitals:   09/15/20 1709 09/15/20 1725 09/15/20 2031 09/16/20 0518  BP: (!) 129/50 (!) 136/50 (!) 142/59 (!) 131/59  Pulse: 70 66 72 65  Resp: 18 18 18 18   Temp: 99 F (37.2 C) 99.2 F (37.3 C) 98.2 F (36.8 C) 98.3 F (36.8 C)  TempSrc: Oral Tympanic Oral Oral  SpO2:  96% 95% 97%  Weight:      Height:        Intake/Output Summary (Last 24 hours) at 09/16/2020 1304 Last data filed at 09/16/2020 0519 Gross per 24 hour  Intake 918 ml  Output 1350 ml  Net -432 ml   Filed Weights   09/14/20 0252  09/14/20 0647  Weight: 87.1 kg 89.3 kg    Examination:  EOMI NCAT pleasant looks younger than stated age no icterus no pallor Neck soft supple Chest clear no added sound no rales no rhonchi Abdomen soft no rebound no guarding Sensory intact to lower extremities-tested with cold  sensation-no issues with plantarflexion however some moderate deficit right side dorsiflexion versus left side when compared Straight leg raise is positive His entire right thigh is bandaged and an Ace wrap   Data Reviewed: personally reviewed   Sodium 135 potassium 4.2 BUNs/creatinine 29/1.2->25/1.1 Hemoglobin nadir 7.6--1 unit PRBC 6/21-->8.3 White count 8.1   Radiology Studies: No results found.   Scheduled Meds:  flecainide  100 mg Oral BID   folic acid  1 mg Oral Daily   lisinopril  10 mg Oral QHS   multivitamin with minerals  1 tablet Oral Daily   rosuvastatin  10 mg Oral QHS   sertraline  100 mg Oral Daily   sodium chloride flush  3 mL Intravenous Q12H   thiamine  100 mg Oral Daily   Continuous Infusions:   LOS: 2 days   Time spent: 68  Rhetta Mura, MD [facility] To contact the attending provider between 7A-7P or the covering provider during after hours 7P-7A, please log into the web site [email or web address] and access using universal [place] password for that web site. If you do not have the password, please call the hospital operator.  09/16/2020, 1:04 PM

## 2020-09-17 LAB — SARS CORONAVIRUS 2 (TAT 6-24 HRS): SARS Coronavirus 2: NEGATIVE

## 2020-09-17 MED ORDER — FERROUS SULFATE 325 (65 FE) MG PO TBEC: 325.0000 mg | DELAYED_RELEASE_TABLET | Freq: Two times a day (BID) | ORAL | 3 refills | Status: DC

## 2020-09-17 MED ORDER — SERTRALINE HCL 100 MG PO TABS: 100.0000 mg | ORAL_TABLET | Freq: Every day | ORAL | 0 refills | Status: DC

## 2020-09-17 MED ORDER — TRAMADOL HCL 50 MG PO TABS: 50.0000 mg | ORAL_TABLET | Freq: Four times a day (QID) | ORAL | 0 refills | Status: DC | PRN

## 2020-09-17 NOTE — TOC Transition Note (Signed)
Transition of Care [facility]) - CM/SW Discharge Note   Patient Details  Name: Brandon Mendoza MRN: [id number] Date of Birth: 07/01/1940  Transition of Care [facility]) CM/SW Contact:  Jimmy Picket, [place] Phone Number: 09/17/2020, 12:45 PM   Clinical Narrative:     Patient will DC to: [facility] Anticipated DC date: 09/17/20 Family notified: pt will notify wife Transport by: Sharin Mons     Per MD patient ready for DC to [facility]. RN, patient, patient's family, and facility notified of DC. Discharge Summary and FL2 sent to facilityDC packet on chart. Ambulance transport requested for patient.    RN to call report to [phone].  CSW will sign off for now as social work intervention is no longer needed. Please consult [place] again if new needs arise.   Final next level of care: Skilled Nursing Facility Barriers to Discharge: Barriers Resolved   Patient Goals and CMS Choice Patient states their goals for this hospitalization and ongoing recovery are:: to get back to normal CMS Medicare.gov Compare Post Acute Care list provided to:: Patient Choice offered to / list presented to : Patient  Discharge Placement              Patient chooses bed at: [facility] Patient to be transferred to facility by: ptar Name of family member notified: pt will notify wife Patient and family notified of of transfer: 09/17/20  Discharge Plan and Services                                     Social Determinants of Health (SDOH) Interventions     Readmission Risk Interventions No flowsheet data found.  Jimmy Picket, Theresia Majors, [place] Clinical Social Worker [phone]

## 2020-09-17 NOTE — Discharge Summary (Addendum)
Physician Discharge Summary  Brandon Mendoza QIO:[id number] DOB: 15-Jun-1940 DOA: 09/14/2020  PCP: Harold Barban, MD  Admit date: 09/14/2020 Discharge date: 09/17/2020  Time spent: 26 minutes  Recommendations for Outpatient Follow-up:  Needs CBC Chem-12 in about 1 week  recommend outpatient evaluation Dr. Toni Arthurs orthopedics in 2 weeks--skilled facility physician to coordinate with Dr. Victorino Dike I will CC Dr. Victorino Dike on discharge No Eliquis until follows up with outpatient [place] cardiologist and discuss his risk-benefit D/c  to skilled facility Keep hip spica wrap at all times unless bathing and keep AFO weightbearing as tolerated on right lower extremity with therapy in the outpatient setting Consider outpatient iron studies in the next several weeks This admission started on p.o. 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 Arbie Cookey at that time Ethanol habituation (still drinks) Prior tobacco quit 2006 Possible OSA, HTN, HLD Paroxysmal a flutter on Eliquis/flecainide (bradycardic with beta-blocker) follows @ [facility] point with Dr. Rolley Sims was for Dr. Lajuana Ripple to place implantable loop recorder?  Asystole causing frequent falls-also transitioned downward 08/12/2020 off of Xanax?   Initially presented [facility] 6/18 with fall probably on 6/13 --> Had recent spine injection with pain management Dr. 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 Victorino Dike consulted-felt risks outweigh benefits of surgical decompression  Hospital Course:  Right thigh hematoma with sciatic irritation/foot drop Kcentra given on admission Nonoperative candidate per orthopedics Dr. Romilda Garret has improved with conservative management is ambulating fair does not require high-dose opiates but has been sent to the facility with tramadol Can be weightbearing as tolerated with AFO right lower extremity and hip spica to be kept at all times unless bathing Symptomatic management-therapy recommends skilled placement Acute blood loss anemia secondary to hematoma Transfused 1 unit PRBC 6/21 Transfusion threshold below 7.0 Anemia seems normocytic-outpatient iron studies Paroxysmal a flutter on flecainide CHADS2 score >4--has bled score >3 has had Zio patch X2 at [facility] Telemetry reviewed-patient predominantly in sinus rhythm Will need outpatient follow-up Dr. Leonie Man at [facility] regarding risk/benefit?  When to restart DOAC versus not LICA stenosis status post endarterectomy, prior stroke 2013 previously on Plavix No current overt deficits at this time GDMT = Crestor 10, lisinopril 10 E TC Frequent falls  Suggest patient follow-up with Dr. Arletha Grippe neurologist in [facility] once he leaves skilled facility for work-up of these falls Possible OSA Outpatient follow-up for retesting  Discharge Exam: Vitals:   09/16/20 2116 09/17/20 0506   BP: 125/63 128/61  Pulse: 63 (!) 56  Resp: 16 16  Temp: 99.3 F (37.4 C) 98.5 F (36.9 C)  SpO2: 97% 97%    Subj on day of d/c   Awake coherent no distress EOMI NCAT in good spirits no pain no fever no chills no nausea no vomiting got up yesterday with therapy  General Exam on discharge  EOMI NCAT no focal deficit seems to be in sinus no JVD chest clear no rales no rhonchi ROM greatly improved to right lower extremity able to bend knee has increasing sensation to lower extremities and foot Hip spica still in place  Discharge Instructions   Discharge Instructions     Diet - low sodium heart healthy   Complete by: As directed    Increase activity slowly   Complete by: As directed       Allergies as of 09/17/2020   No Known Allergies      Medication List     STOP taking these medications    Eliquis 5 MG Tabs tablet Generic drug: apixaban   Fish Oil 1000 MG Caps       TAKE these medications    acetaminophen 500 MG tablet Commonly known as: TYLENOL Take 1,000 mg by mouth every 6 (six) hours as needed for mild pain or headache.   ALPRAZolam 0.5 MG tablet Commonly known as: XANAX Take 0.25 mg by mouth at bedtime.   ferrous sulfate 325 (65 FE) MG EC tablet Take 1 tablet (325 mg total) by mouth 2 (two) times daily.   flecainide 100 MG tablet Commonly known as: TAMBOCOR Take 100 mg by mouth 2 (two) times daily.   lidocaine 5 % Commonly known as: Lidoderm Place 1 patch onto the skin daily. Remove & Discard patch within 12 hours or as directed by MD What changed:  when to take this reasons to take this additional instructions   lisinopril 10 MG tablet Commonly known as: ZESTRIL Take 10 mg by mouth at bedtime.   methocarbamol 500 MG tablet Commonly known as: ROBAXIN Take 1 tablet (500 mg total) by mouth 2 (two) times daily as needed for muscle spasms.   multivitamins ther. w/minerals Tabs tablet Take 1 tablet by mouth daily.   rosuvastatin 10  MG tablet Commonly known as: CRESTOR Take 10 mg by mouth at bedtime.   sertraline 100 MG tablet Commonly known as: ZOLOFT Take 1 tablet (100 mg total) by mouth daily.   silver sulfADIAZINE 1 % cream Commonly known as: SILVADENE Apply 1 application topically daily as needed (to bruised areas).   traMADol 50 MG tablet Commonly known as: Ultram Take 1 tablet (50 mg total) by mouth every 6 (six) hours as needed.   VITAMIN C PO Take 2 tablets by mouth in the morning.       No Known Allergies  Follow-up Information     Toni Arthurs, MD Follow up in 3 week(s).   Specialty: Orthopedic Surgery Contact information: [address] [place] [address] [phone]                  The results of significant diagnostics from this hospitalization (including imaging, microbiology, ancillary and laboratory) are listed below for reference.    Significant Diagnostic Studies: CT FEMUR RIGHT W WO CONTRAST  Result Date: 09/14/2020 CLINICAL DATA:  80 year old male with large hematoma after fall 6 days ago. Unrevealing hip radiograph, query Morel-Lavalle lesion. EXAM: CT OF THE LOWER RIGHT EXTREMITY WITHOUT CONTRAST TECHNIQUE: Multidetector CT imaging of the lower right extremity was performed  following the standard protocol before and during bolus administration of intravenous contrast. CONTRAST:  75mL OMNIPAQUE IOHEXOL 300 MG/ML  SOLN COMPARISON:  Right hip series 09/12/2020. FINDINGS: Visible pelvis demonstrates iliac artery atherosclerosis. Calcified peripheral vascular disease but the major arterial structures in the visible pelvis and the visible right lower extremity appear to be patent. The major venous structures in the distal right lower extremity are enhancing at the time of imaging and appear to be patent. Negative urinary bladder. Negative visible bowel in the pelvis. There is presacral stranding in the pelvis, but no free fluid. Bone windows demonstrate no right  hemipelvis fracture. The proximal right femur appears intact. The right femoral shaft and visible distal femur, patella, proximal tibia and fibula appear intact. No fracture identified. No knee joint effusion. Soft tissue windows demonstrate widespread lateral subcutaneous soft tissue stranding beginning at the flank and continuing distally in the leg. The subcutaneous stranding becomes posterior near the knee. Posterior to the proximal femur beginning just below the level of the lesser trochanter there is an oval, bilobed heterogeneous intramuscular lesion (series 10, image 145 and series 7, image 46 extending about 14 cm in length which most resembles a large intramuscular hematoma, and therea could be a small focus of contrast extravasation on series 10, image 146. Some lobular extension of this lesion toward the superficial fossa is identified (series 7, image 31), but there is no solid or cystic mass superficial to the deep fascia to suggest Morel-Lavalle lesion. All told, this lesion encompasses about 52 x 68 by 143 mm (250 mL estimated volume) furthermore, there is also some gluteal and other regional intramuscular hematoma suspected just below the pelvis such as on series 10, images 32 and 65. No soft tissue gas. No intramuscular hematoma identified more distally. IMPRESSION: 1. No cystic or solid mass superficial to the deep fascia to suggest a Morel-Lavalle lesion, but rather multifocal intramuscular hematoma is suspected from the right buttocks region tracking posteriorly - including a large roughly 250 mL hematoma in the musculature posterior to the femoral shaft beginning just below the lesser trochanter. And there is a possible small area of contrast extravasation within that lesion. 2. No fracture identified. 3. Calcified peripheral vascular disease. Electronically Signed   By: Odessa Fleming M.D.   On: 09/14/2020 05:27   DG Hip Unilat With Pelvis 2-3 Views Right  Result Date: 09/12/2020 CLINICAL DATA:   Fall.  Right hip pain. EXAM: DG HIP (WITH OR WITHOUT PELVIS) 2-3V RIGHT COMPARISON:  None. FINDINGS: There is no evidence of hip fracture or dislocation. Mild degenerative spurring of the right femoral head is seen, without evidence of joint space narrowing. No other focal bone abnormality. IMPRESSION: No acute findings. Mild right hip osteoarthritis. Electronically Signed   By: Danae Orleans M.D.   On: 09/12/2020 09:05    Microbiology: Recent Results (from the past 240 hour(s))  Resp Panel by RT-PCR (Flu A&B, Covid) Nasopharyngeal Swab     Status: None   Collection Time: 09/14/20  5:35 AM   Specimen: Nasopharyngeal Swab; Nasopharyngeal(NP) swabs in vial transport medium  Result Value Ref Range Status   SARS Coronavirus 2 by RT PCR NEGATIVE NEGATIVE Final    Comment: (NOTE) SARS-CoV-2 target nucleic acids are NOT DETECTED.  The SARS-CoV-2 RNA is generally detectable in upper respiratory specimens during the acute phase of infection. The lowest concentration of SARS-CoV-2 viral copies this assay can detect is 138 copies/mL. A negative result does not preclude SARS-Cov-2 infection and should not  be used as the sole basis for treatment or other patient management decisions. A negative result may occur with  improper specimen collection/handling, submission of specimen other than nasopharyngeal swab, presence of viral mutation(s) within the areas targeted by this assay, and inadequate number of viral copies(<138 copies/mL). A negative result must be combined with clinical observations, patient history, and epidemiological information. The expected result is Negative.  Fact Sheet for Patients:  BloggerCourse.com  Fact Sheet for Healthcare Providers:  SeriousBroker.it  This test is no t yet approved or cleared by the [place] FDA and  has been authorized for detection and/or diagnosis of SARS-CoV-2 by FDA under an Emergency Use  Authorization (EUA). This EUA will remain  in effect (meaning this test can be used) for the duration of the COVID-19 declaration under Section 564(b)(1) of the Act, 21 U.S.C.section 360bbb-3(b)(1), unless the authorization is terminated  or revoked sooner.       Influenza A by PCR NEGATIVE NEGATIVE Final   Influenza B by PCR NEGATIVE NEGATIVE Final    Comment: (NOTE) The Xpert Xpress SARS-CoV-2/FLU/RSV plus assay is intended as an aid in the diagnosis of influenza from Nasopharyngeal swab specimens and should not be used as a sole basis for treatment. Nasal washings and aspirates are unacceptable for Xpert Xpress SARS-CoV-2/FLU/RSV testing.  Fact Sheet for Patients: BloggerCourse.com  Fact Sheet for Healthcare Providers: SeriousBroker.it  This test is not yet approved or cleared by the [place] FDA and has been authorized for detection and/or diagnosis of SARS-CoV-2 by FDA under an Emergency Use Authorization (EUA). This EUA will remain in effect (meaning this test can be used) for the duration of the COVID-19 declaration under Section 564(b)(1) of the Act, 21 U.S.C. section 360bbb-3(b)(1), unless the authorization is terminated or revoked.  Performed at [facility], [address] [place], [place] [address]      Labs: Basic Metabolic Panel: Recent Labs  Lab 09/14/20 0329 09/15/20 0154 09/16/20 0155  NA 132* 132* 135  K 4.5 4.5 4.2  CL 100 105 104  CO2 21* 22 24  GLUCOSE 185* 109* 91  BUN 45* 29* 25*  CREATININE 1.83* 1.29* 1.13  CALCIUM 8.6* 7.9* 8.4*  MG  --  1.9 2.0  PHOS  --  2.7 2.6   Liver Function Tests: Recent Labs  Lab 09/15/20 0154  AST 13*  ALT 15  ALKPHOS 40  BILITOT 0.8  PROT 4.5*  ALBUMIN 2.3*   No results for input(s): LIPASE, AMYLASE in the last 168 hours. No results for input(s): AMMONIA in the last 168 hours. CBC: Recent Labs  Lab 09/14/20 0329 09/14/20 1059  09/15/20 0154 09/15/20 1510 09/15/20 2251 09/16/20 0155  WBC 13.3*  --  7.7  --   --  8.1  NEUTROABS 9.8*  --   --   --   --   --   HGB 10.4* 9.6* 7.7* 7.6* 8.3* 8.3*  HCT 29.4* 28.3* 22.4* 22.4* 24.1* 24.3*  MCV 89.1  --  92.6  --   --  89.7  PLT 279  --  201  --   --  210   Cardiac Enzymes: Recent Labs  Lab 09/14/20 1048  CKTOTAL 30*   BNP: BNP (last 3 results) No results for input(s): BNP in the last 8760 hours.  ProBNP (last 3 results) No results for input(s): PROBNP in the last 8760 hours.  CBG: No results for input(s): GLUCAP in the last 168 hours.  Signed:  Rhetta MuraJai-Gurmukh Nicasio Barlowe MD   [facility] 09/17/2020, 11:05 AM

## 2020-09-17 NOTE — Progress Notes (Signed)
Report called to facility

## 2020-09-17 NOTE — Progress Notes (Signed)
Patient IV removed, belongings collected. Patient compression sock placed. Assisted patient with getting dressed. PTAR received patient belongings and discharge information. Report called to facility in [facility].

## 2020-09-17 NOTE — Progress Notes (Signed)
Occupational Therapy Treatment Patient Details Name: Brandon Mendoza MRN: [id number] DOB: June 03, 1940 Today's Date: 09/17/2020    History of present illness Brandon Mendoza is a 80 y.o. male admitted on 09/14/20 after taking his lawnmower out of the car when he lost his balance falling onto his buttock on the right side.  Pt dx with R thigh hematoma with sciatic nerve compression with resultant numbess and foot drop.  His eliquis has been put on hold and they are minitoring his hemaglobin closely.  He is not considered a surgical debridement candidate at this time and has no acute fractures.   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   OT comments  Pt making progress with functional goals. Session focused on bed mobility to st EOB, sit  stand to RW, functional mobility walking to bathroom with RW, toilet transfers, toileting tasks, grooming/hygiene standing at sink ad LB ADL compensatory techniques education. Pt requires cues for safety due to his impulsivity. Pt with forward flexion during stand - sit transition and grooming at sink and required cues and for safety along with practicing stand - sit with trunk/back as upright as possible. Pt hopeful to d/c to post acute rehab later today  Follow Up Recommendations  SNF;Other (comment) (CIR denied)    Equipment Recommendations  3 in 1 bedside commode;Other (comment) (RW, reacher, TBD at [facility])    Recommendations for Other Services      Precautions / Restrictions Precautions Precautions: Fall Required Braces or Orthoses: Other Brace Other Brace: R LE AFO, R thigh high TED hose, R hip spica wrap Restrictions Weight Bearing Restrictions: No       Mobility Bed Mobility Overal bed mobility: Modified Independent                  Transfers Overall transfer level: Needs assistance Equipment used: Rolling walker (2  wheeled) Transfers: Sit to/from Stand Sit to Stand: Min assist Stand pivot transfers: Min guard            Balance Overall balance assessment: Needs assistance Sitting-balance support: Feet supported;No upper extremity supported Sitting balance-Leahy Scale: Good     Standing balance support: Bilateral upper extremity supported;During functional activity Standing balance-Leahy Scale: Fair                             ADL either performed or assessed with clinical judgement   ADL Overall ADL's : Needs assistance/impaired     Grooming: Wash/dry hands;Wash/dry face;Min guard;Standing;Cueing for Administrator, arts: Ambulation;RW;Cueing for safety;Cueing for sequencing;Minimal assistance;Min guard;Comfort height toilet;Grab bars   Toileting- Clothing Manipulation and Hygiene: Minimal assistance;Sit to/from stand       Functional mobility during ADLs: Minimal assistance;Min guard;Rolling walker;Cueing for safety General ADL Comments: pt impulsive     Vision Baseline Vision/History: Wears glasses Wears Glasses: Reading only Patient Visual Report: No change from baseline     Perception     Praxis      Cognition Arousal/Alertness: Awake/alert Behavior During Therapy: Impulsive Overall Cognitive Status: Within Functional Limits for tasks assessed                                 General Comments: Pt remains  inpulsive, quick to move, forward felxion stand - sit transitions and standing at sink for selfcare/hygiene        Exercises General Exercises - Lower Extremity Ankle Circles/Pumps: AROM;Both;20 reps Quad Sets: AROM;Both;10 reps Gluteal Sets: AROM;Both;10 reps   Shoulder Instructions       General Comments I took down his R Leg wrap in prep for thigh high TED hose arrival.  Educated on edema managment exercises and provided with handout    Pertinent Vitals/ Pain       Pain Assessment: Faces Pain Score:  0-No pain Faces Pain Scale: No hurt Pain Intervention(s): Monitored during session;Repositioned  Home Living                                          Prior Functioning/Environment              Frequency  Min 2X/week        Progress Toward Goals  OT Goals(current goals can now be found in the care plan section)  Progress towards OT goals: Progressing toward goals  Acute Rehab OT Goals Patient Stated Goal: go home  Plan Discharge plan needs to be updated;Frequency remains appropriate    Co-evaluation                 AM-PAC OT "6 Clicks" Daily Activity     Outcome Measure   Help from another person eating meals?: None Help from another person taking care of personal grooming?: A Little Help from another person toileting, which includes using toliet, bedpan, or urinal?: A Little Help from another person bathing (including washing, rinsing, drying)?: A Lot Help from another person to put on and taking off regular upper body clothing?: None Help from another person to put on and taking off regular lower body clothing?: A Lot 6 Click Score: 18    End of Session Equipment Utilized During Treatment: Gait belt;Rolling walker  OT Visit Diagnosis: Unsteadiness on feet (R26.81);History of falling (Z91.81);Muscle weakness (generalized) (M62.81);Other abnormalities of gait and mobility (R26.89)   Activity Tolerance Patient tolerated treatment well   Patient Left in chair;with call bell/phone within reach;with family/visitor present   Nurse Communication          Time: 6226-3335 OT Time Calculation (min): 19 min  Charges: OT General Charges $OT Visit: 1 Visit OT Treatments $Mendoza Care/Home Management : 8-22 mins     Galen Manila 09/17/2020, 2:33 PM

## 2020-09-17 NOTE — Progress Notes (Addendum)
Physical Therapy Treatment Patient Details Name: Brandon Mendoza MRN: [id number] DOB: 1941/03/27 Today's Date: 09/17/2020    History of Present Illness NYAIR DEPAULO is a 80 y.o. male admitted on 09/14/20 after taking his lawnmower out of the car when he lost his balance falling onto his buttock on the right side.  Pt dx with R thigh hematoma with sciatic nerve compression with resultant numbess and foot drop.  His eliquis has been put on hold and they are minitoring his hemaglobin closely.  He is not considered a surgical debridement candidate at this time and has no acute fractures.   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    PT Comments    Pt is ready for d/c to SNF for rehab. Just worked with OT.  Awaiting arrival of thigh high TED hose (RN aware).  I took down his full leg LE wrap on the right in prep for TED hose and would like to come back and do his hip spica wrap, but am unsure if the timing will work out.  Edema management HEP given and reviewed (with handout).  PT to follow acutely until d/c confirmed.    [facility] HEP: [id number]  Follow Up Recommendations  SNF     Equipment Recommendations  Rolling walker with 5" wheels    Recommendations for Other Services       Precautions / Restrictions Precautions Precautions: Fall Required Braces or Orthoses: Other Brace Other Brace: R LE AFO, R thigh high TED hose, R hip spica wrap Restrictions Weight Bearing Restrictions: No    Mobility  Bed Mobility Overal bed mobility: Modified Independent                  Transfers                    Ambulation/Gait                 Stairs             Wheelchair Mobility    Modified Rankin (Stroke Patients Only)       Balance                                            Cognition Arousal/Alertness:  Awake/alert Behavior During Therapy: Impulsive Overall Cognitive Status: Within Functional Limits for tasks assessed                                 General Comments: Pt remains inpulsive, quick to move did not donn his brace on R foot going to the bathroom with his wife.      Exercises General Exercises - Lower Extremity Ankle Circles/Pumps: AROM;Both;20 reps Quad Sets: AROM;Both;10 reps Gluteal Sets: AROM;Both;10 reps    General Comments General comments (skin integrity, edema, etc.): I took down his R Leg wrap in prep for thigh high TED hose arrival.  Educated on edema managment exercises and provided with handout      Pertinent Vitals/Pain Pain Assessment: Faces Faces Pain Scale: No hurt    Home Living                      Prior Function  PT Goals (current goals can now be found in the care plan section) Progress towards PT goals: Progressing toward goals    Frequency    Min 3X/week      PT Plan Current plan remains appropriate    Co-evaluation              AM-PAC PT "6 Clicks" Mobility   Outcome Measure  Help needed turning from your back to your side while in a flat bed without using bedrails?: None Help needed moving from lying on your back to sitting on the side of a flat bed without using bedrails?: None Help needed moving to and from a bed to a chair (including a wheelchair)?: A Little Help needed standing up from a chair using your arms (e.g., wheelchair or bedside chair)?: A Little Help needed to walk in hospital room?: A Little Help needed climbing 3-5 steps with a railing? : A Lot 6 Click Score: 19    End of Session   Activity Tolerance: Patient tolerated treatment well Patient left: in bed;with call bell/phone within reach;with family/visitor present Nurse Communication: Other (comment) (need to order TED hose) PT Visit Diagnosis: Muscle weakness (generalized) (M62.81);Difficulty in walking, not elsewhere  classified (R26.2);Pain Pain - Right/Left: Right Pain - part of body: Leg     Time: 1250-1309 PT Time Calculation (min) (ACUTE ONLY): 19 min  Charges:  $Therapeutic Activity: 8-22 mins                     Corinna Capra, PT, DPT  Acute Rehabilitation Ortho Tech Supervisor [phone] pager [phone]) [phone] office

## 2020-10-02 DIAGNOSIS — M21371 Foot drop, right foot: Secondary | ICD-10-CM | POA: Insufficient documentation

## 2020-10-02 DIAGNOSIS — S7001XA Contusion of right hip, initial encounter: Secondary | ICD-10-CM | POA: Insufficient documentation

## 2020-10-05 DIAGNOSIS — G8929 Other chronic pain: Secondary | ICD-10-CM | POA: Insufficient documentation

## 2020-10-05 DIAGNOSIS — W19XXXA Unspecified fall, initial encounter: Secondary | ICD-10-CM | POA: Insufficient documentation

## 2020-10-05 DIAGNOSIS — I129 Hypertensive chronic kidney disease with stage 1 through stage 4 chronic kidney disease, or unspecified chronic kidney disease: Secondary | ICD-10-CM | POA: Insufficient documentation

## 2020-10-05 DIAGNOSIS — N183 Chronic kidney disease, stage 3 unspecified: Secondary | ICD-10-CM | POA: Insufficient documentation

## 2020-10-05 DIAGNOSIS — M5441 Lumbago with sciatica, right side: Secondary | ICD-10-CM | POA: Insufficient documentation

## 2020-10-15 ENCOUNTER — Other Ambulatory Visit: Payer: Self-pay | Admitting: Physician Assistant

## 2020-10-15 ENCOUNTER — Ambulatory Visit
Admission: RE | Admit: 2020-10-15 | Discharge: 2020-10-15 | Disposition: A | Discharge status: Home / Self Care | Payer: Medicare Other | Source: Ambulatory Visit | Attending: Physician Assistant | Admitting: Physician Assistant

## 2020-10-15 DIAGNOSIS — R198 Other specified symptoms and signs involving the digestive system and abdomen: Secondary | ICD-10-CM

## 2020-10-15 IMAGING — CR DG ABDOMEN 1V
1 series · 1 of 1 positions shown · non-contrast
Comparison: [DATE]

CLINICAL DATA: Constipation

EXAM:
ABDOMEN - 1 VIEW

[t abdomen supine]
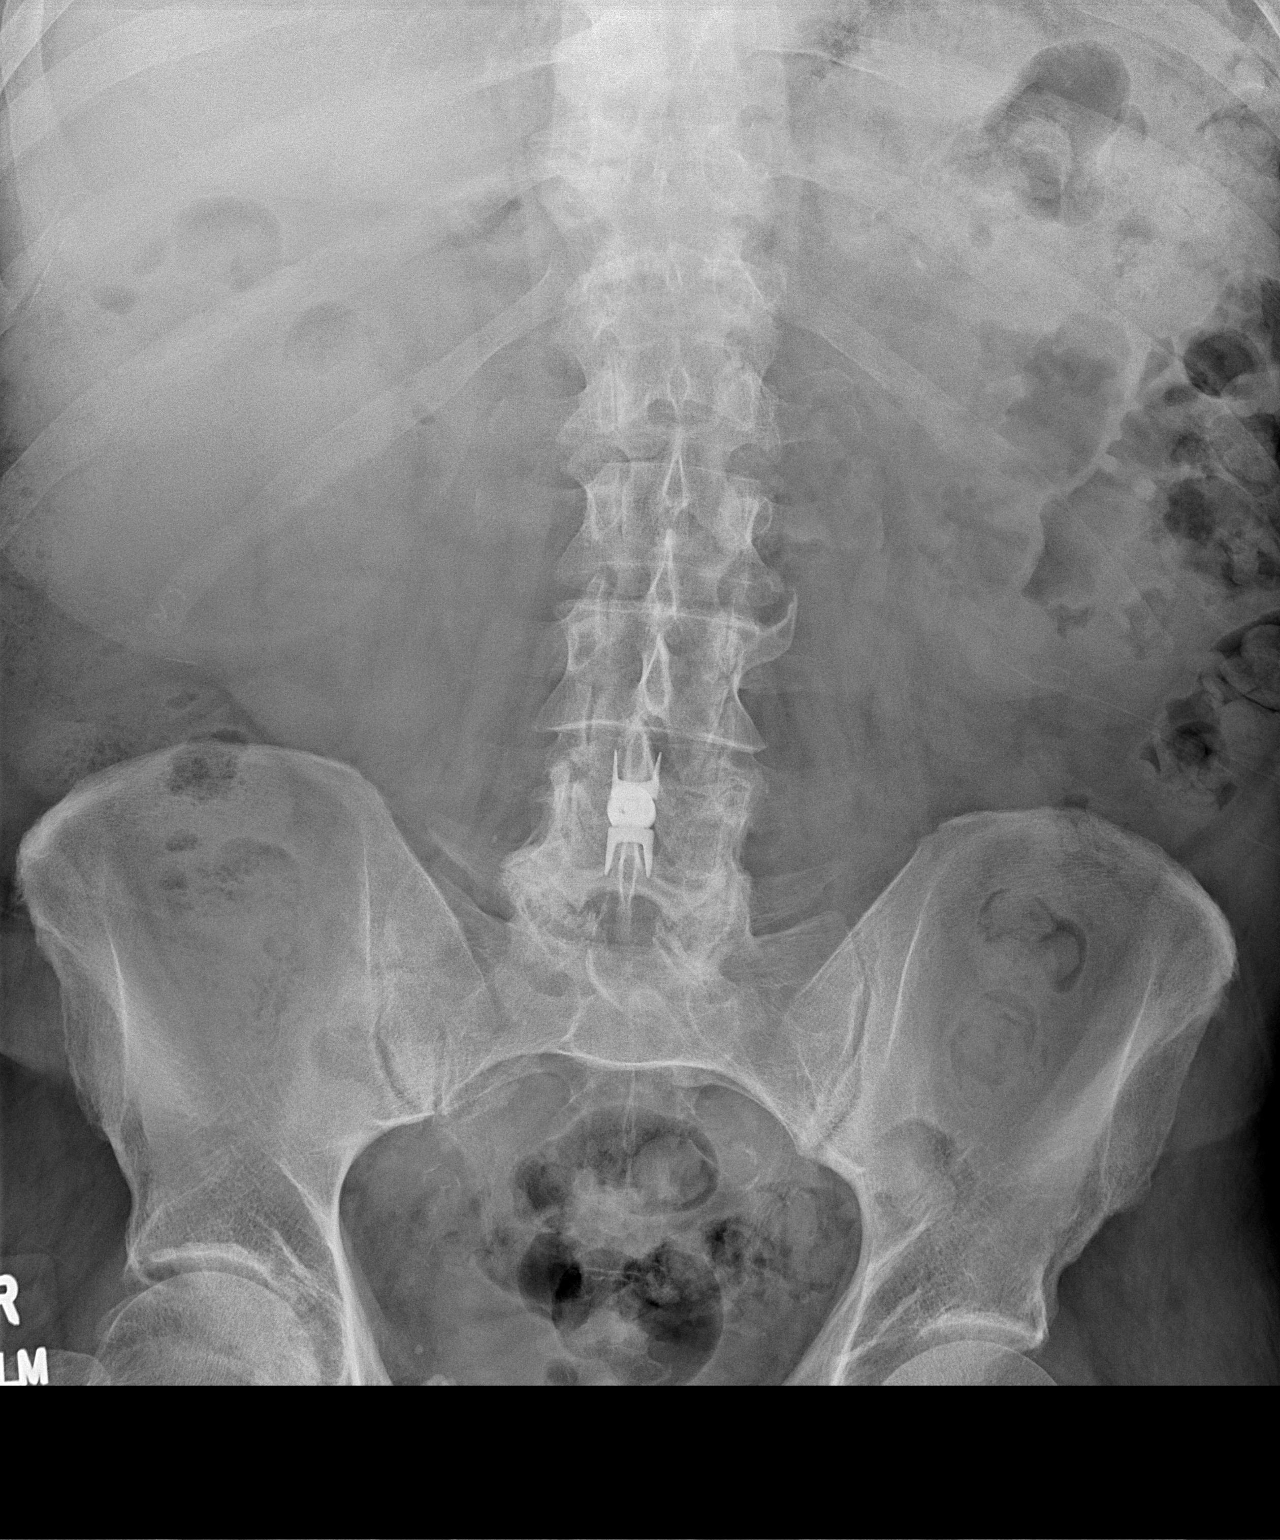

[1 of 1 positions shown; findings below may reference images not displayed]

FINDINGS: Normal abdominal gas pattern. Relatively small volume stool seen
within the distal colon and rectal vault. No gross free
intraperitoneal gas. Multiple phleboliths noted within the pelvis.
L4-5 spinous process fixation hardware noted.
IMPRESSION: Normal abdominal gas pattern.  Small stool.

## 2020-10-19 ENCOUNTER — Other Ambulatory Visit: Payer: Self-pay | Admitting: Physician Assistant

## 2020-10-19 DIAGNOSIS — R634 Abnormal weight loss: Secondary | ICD-10-CM

## 2020-10-21 ENCOUNTER — Ambulatory Visit
Admission: RE | Admit: 2020-10-21 | Discharge: 2020-10-21 | Disposition: A | Discharge status: Home / Self Care | Payer: Medicare Other | Source: Ambulatory Visit | Attending: Physician Assistant | Admitting: Physician Assistant

## 2020-10-21 DIAGNOSIS — R634 Abnormal weight loss: Secondary | ICD-10-CM

## 2020-10-21 IMAGING — CT CT ABD-PELV W/O CM
2 of 4 series · 12 of 46 positions shown, 14 images · non-contrast
Comparison: None.

CLINICAL DATA: Weight loss.  Constipation.

EXAM:
CT ABDOMEN AND PELVIS WITHOUT CONTRAST
TECHNIQUE: Multidetector CT imaging of the abdomen and pelvis was performed
following the standard protocol without IV contrast.

[Series 2: routine abdomen pelvis without 5.00 br40 s3 axial · axial · non-contrast · 0.60mm/px · z∈[+1239,+1604]mm · 9 of 89 slices shown, 11 images]
[im 8/89  soft-tissue]
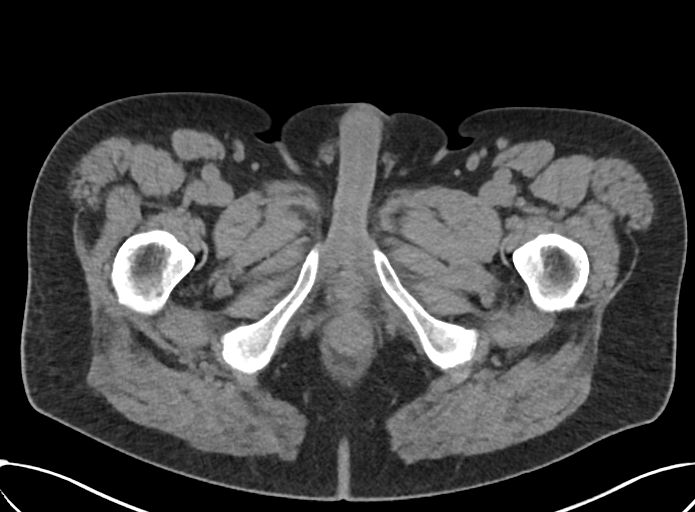
[im 8/89  bone]
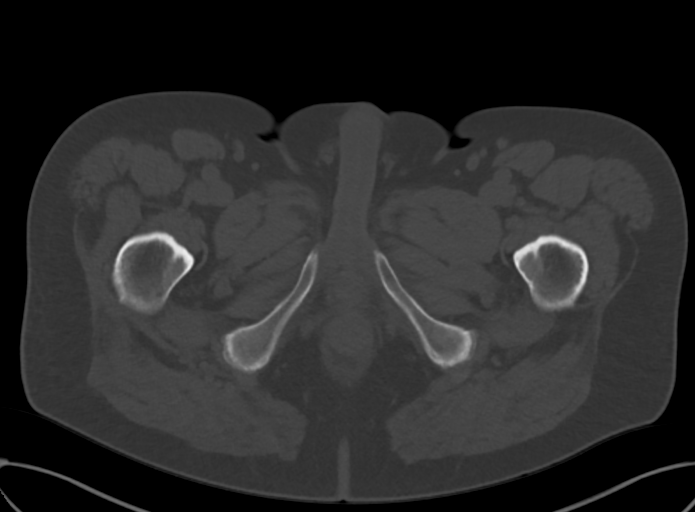
[im 16/89  soft-tissue]
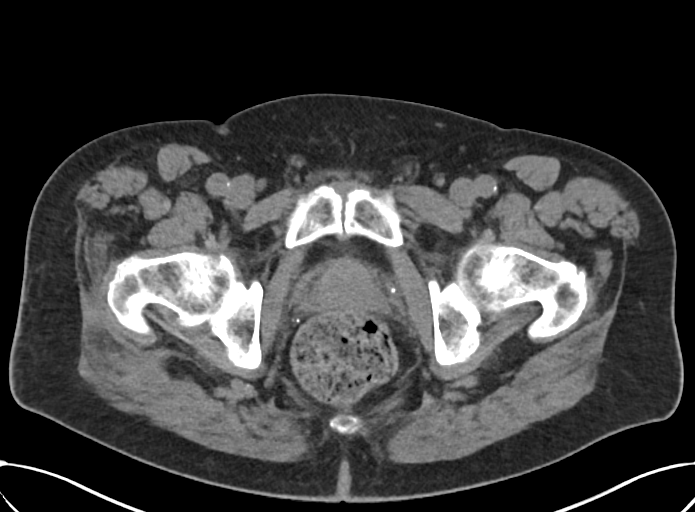
[im 27/89  soft-tissue]
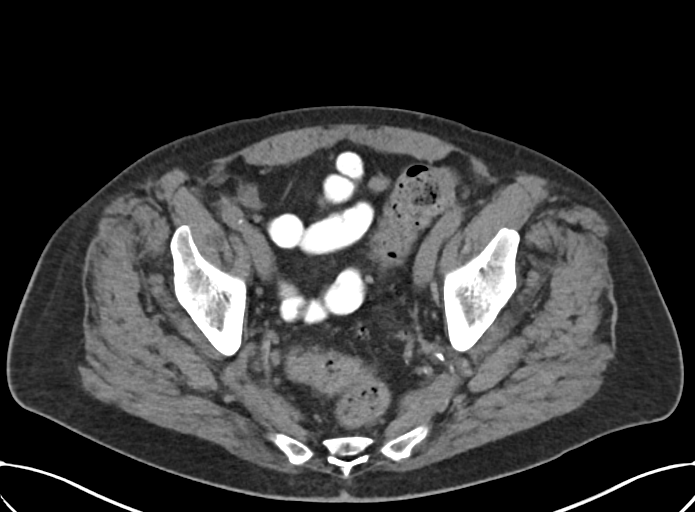
[im 35/89  soft-tissue]
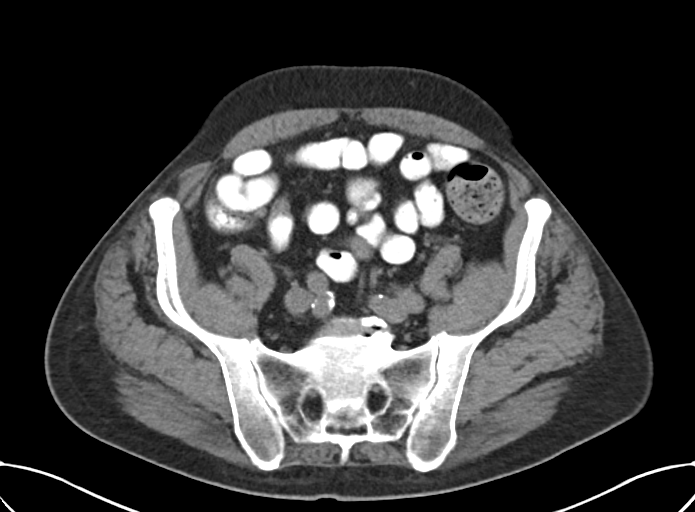
[im 46/89  soft-tissue]
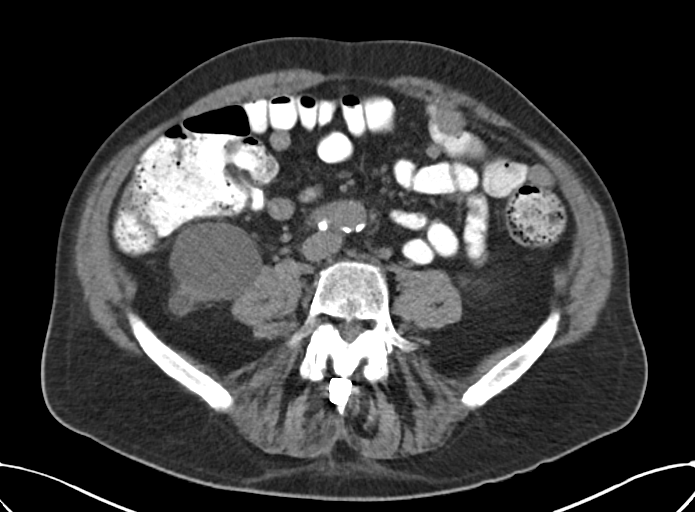
[im 54/89  soft-tissue]
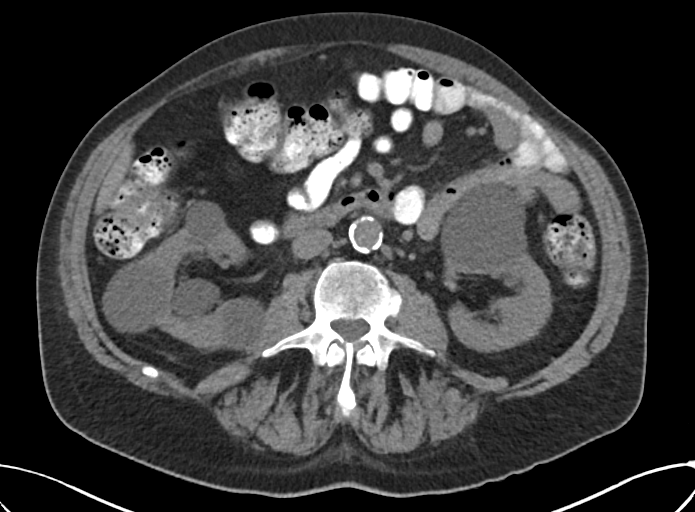
[im 62/89  soft-tissue]
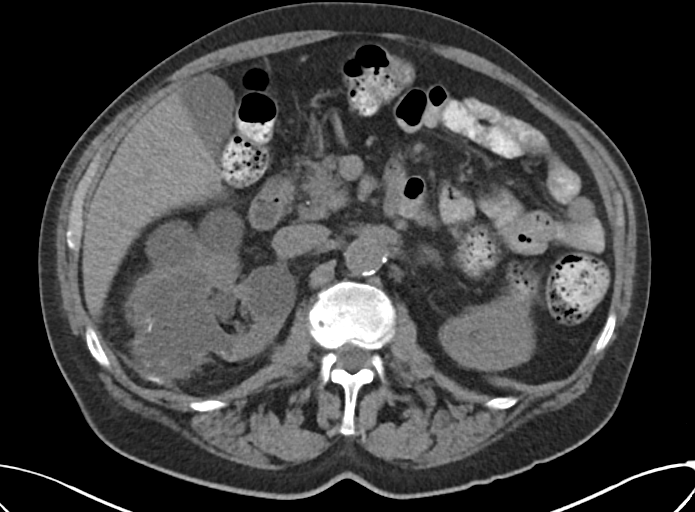
[im 73/89  soft-tissue]
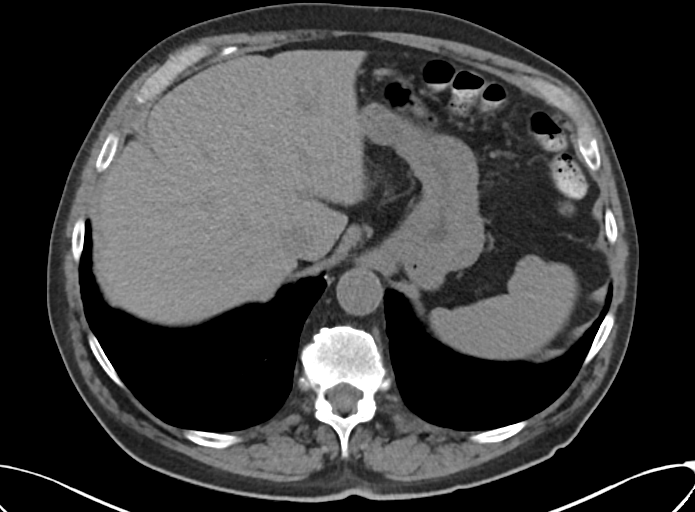
[im 81/89  soft-tissue]
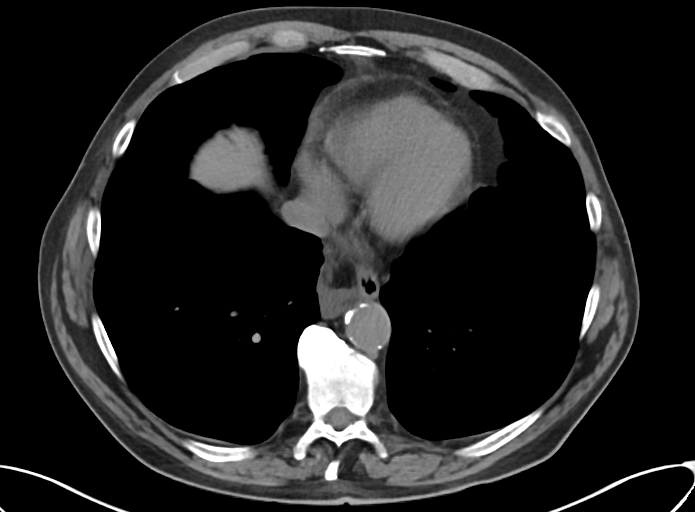
[im 81/89  bone]
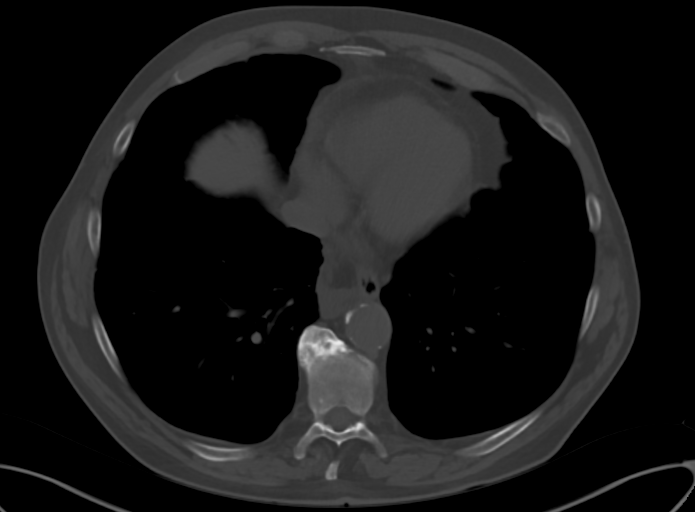

[Series 4: routine abdomen pelvis without 2.00 br40 s3 cor · coronal · non-contrast · 0.81mm/px · 3 of 153 slices shown]
[im 51/153  soft-tissue]
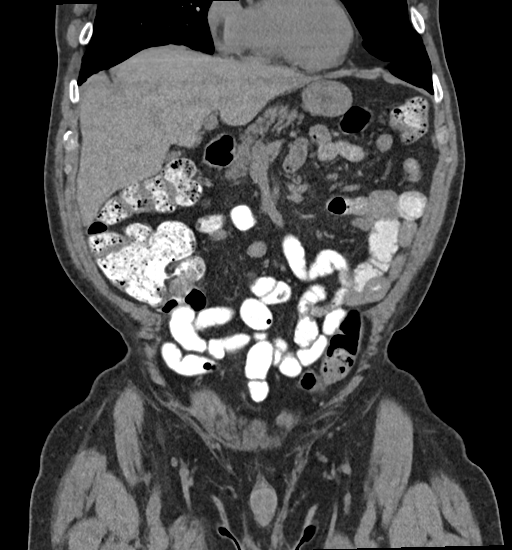
[im 68/153  soft-tissue]
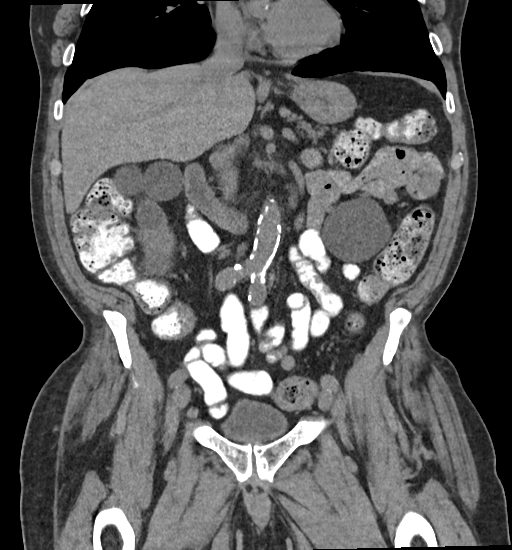
[im 85/153  soft-tissue]
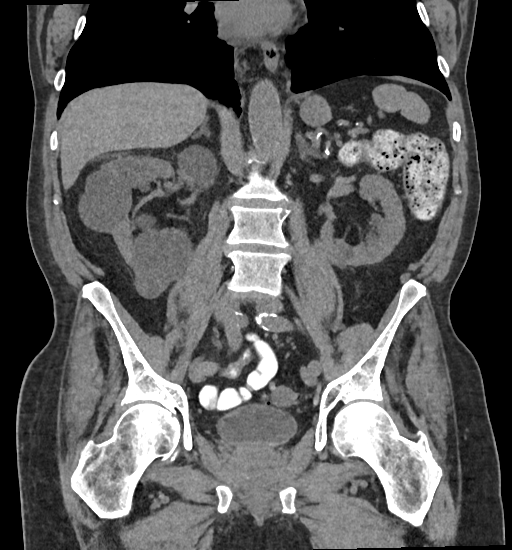

[12 of 46 positions shown; findings below may reference images not displayed]

FINDINGS: Lower chest: Few tiny densities in the left lower lobe on sequence 9
image 6 are likely postinflammatory. No pleural effusions.
Low-density structure in the lower posterior mediastinum has fluid
density and this structure measures 3.63 cm. This could represent a
small fluid collection.

Hepatobiliary: Normal appearance of the liver and gallbladder. No
biliary dilatation.

Pancreas: Unremarkable. No pancreatic ductal dilatation or
surrounding inflammatory changes.

Spleen: Normal in size without focal abnormality.

Adrenals/Urinary Tract: Low-density fullness in left adrenal gland
is suggestive for hyperplasia or adenoma. Normal appearance of the
right adrenal gland. There are numerous right renal cysts including
a complex cystic structure with calcifications along the right
kidney upper pole. The complex structure in the right kidney upper
pole with calcifications is poorly defined but roughly measures
x 5.6 cm on sequence 2 image 29. Majority of the right renal cysts
are fluid attenuation and suggestive for simple cysts. There is a
large solitary cyst in the left kidney lower pole measuring up to
5.5 cm. Small amount of fluid in the urinary bladder and limited
evaluation. No hydronephrosis.

Stomach/Bowel: Normal appearance of stomach and duodenum. Distention
of the rectum with stool. No bowel inflammation. Negative for bowel
obstruction.

Vascular/Lymphatic: Atherosclerotic calcifications abdominal aorta
without aneurysm. No lymph node enlargement in the abdomen or
pelvis.

Reproductive: Prostate is unremarkable.

Other: Negative for free fluid. Negative for free air. Tiny
umbilical hernia containing fat. Small amount of soft tissue near
the origin of the right inguinal canal on sequence 2, image 64. This
finding can be associated with previous hernia repair.

Musculoskeletal: Surgical fixation along the spinal processes of L4
and L5. Minimal anterolisthesis of L4 and L5. No acute bone
abnormality.
IMPRESSION: 1. Complex renal cystic lesion involving the right kidney upper
pole. Structure is poorly defined with irregular calcifications.
Neoplastic process cannot be excluded. Recommend further
characterization with abdominal MRI, with and without contrast.
2. Bilateral renal cysts.
3. Fluid attenuating structure along the posterior lower
mediastinum. This could represent a foregut duplication cyst such as
an esophageal duplication cyst.
4.  Aortic Atherosclerosis ([TP]-[TP]).
5. Small amount of soft tissue near the right inguinal canal.
Suspect right inguinal hernia repair in this area. Recommend
clinical correlation.

## 2020-10-22 DIAGNOSIS — Z9889 Other specified postprocedural states: Secondary | ICD-10-CM | POA: Insufficient documentation

## 2020-10-22 HISTORY — DX: Other specified postprocedural states: Z98.890

## 2020-10-23 ENCOUNTER — Other Ambulatory Visit: Payer: Self-pay | Admitting: Physician Assistant

## 2020-10-23 DIAGNOSIS — N281 Cyst of kidney, acquired: Secondary | ICD-10-CM

## 2020-10-26 ENCOUNTER — Ambulatory Visit: Payer: Medicare Other | Admitting: Family Medicine

## 2020-10-28 ENCOUNTER — Ambulatory Visit
Admission: RE | Admit: 2020-10-28 | Discharge: 2020-10-28 | Disposition: A | Discharge status: Home / Self Care | Payer: Medicare Other | Source: Ambulatory Visit | Attending: Physician Assistant | Admitting: Physician Assistant

## 2020-10-28 DIAGNOSIS — B353 Tinea pedis: Secondary | ICD-10-CM | POA: Insufficient documentation

## 2020-10-28 DIAGNOSIS — N281 Cyst of kidney, acquired: Secondary | ICD-10-CM

## 2020-10-28 IMAGING — MR MR ABDOMEN WO/W CM
12 of 17 series · 32 of 48 positions shown · IV contrast (multihance)
Comparison: Noncontrast CT on [DATE]

CLINICAL DATA: Complex cystic lesion of right kidney.

EXAM:
MRI ABDOMEN WITHOUT AND WITH CONTRAST
TECHNIQUE: Multiplanar multisequence MR imaging of the abdomen was performed
both before and after the administration of intravenous contrast.
CONTRAST:  17mL MULTIHANCE GADOBENATE DIMEGLUMINE 529 MG/ML IV SOLN

[Series 4: T2 · coronal · 5.0mm · 1.56mm/px · 2 of 20 slices shown (1 of 3)]
[im 1/20]
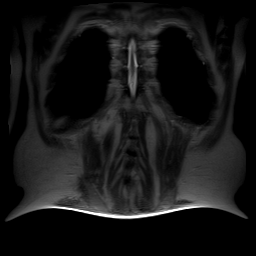
[im 20/20]
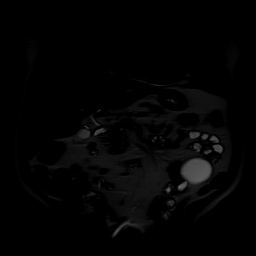

[Series 5: axial tru fisp · axial · 4.5mm · 1.48mm/px · z∈[-142,+28]mm · 3 of 30 slices shown]
[im 1/30]
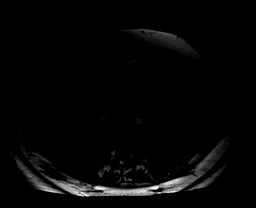
[im 15/30]
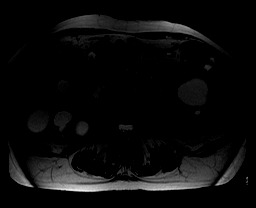
[im 30/30]
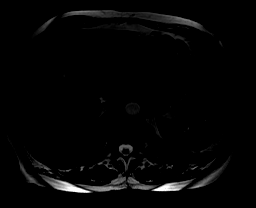

[Series 6: [id number]_500_[id number] · axial · 6.0mm · 1.98mm/px · z∈[-115,+72]mm · 5 of 74 slices shown]
[im 1/74]
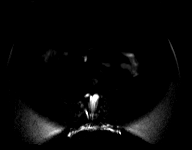
[im 19/74]
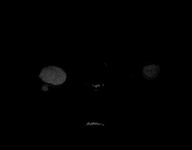
[im 37/74]
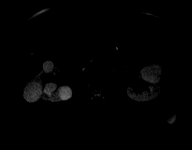
[im 55/74]
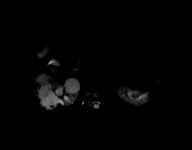
[im 74/74]
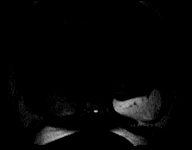

[Series 7: [id number]_adc · axial · 6.0mm · 1.98mm/px · z∈[-115,+72]mm · 2 of 25 slices shown]
[im 1/25]
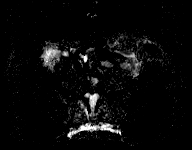
[im 25/25]
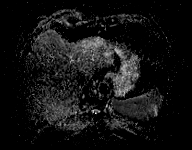

[Series 8: T2 · axial · 6.5mm · 0.72mm/px · 1 of 26 slices shown (2 of 3)]
[im 1/26]
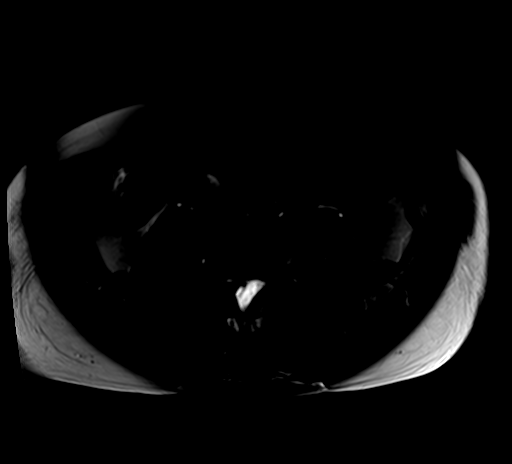

[Series 9: T2 · axial · 5.0mm · 1.37mm/px · z∈[-153,+74]mm · 2 of 36 slices shown (3 of 3)]
[im 1/36]
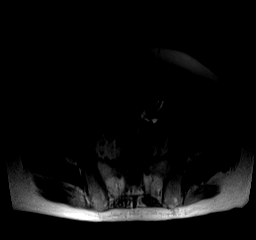
[im 36/36]
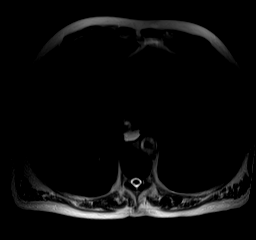

[Series 10: axial in out · axial · 5.5mm · 0.70mm/px · z∈[-118,+66]mm · 3 of 60 slices shown]
[im 1/60]
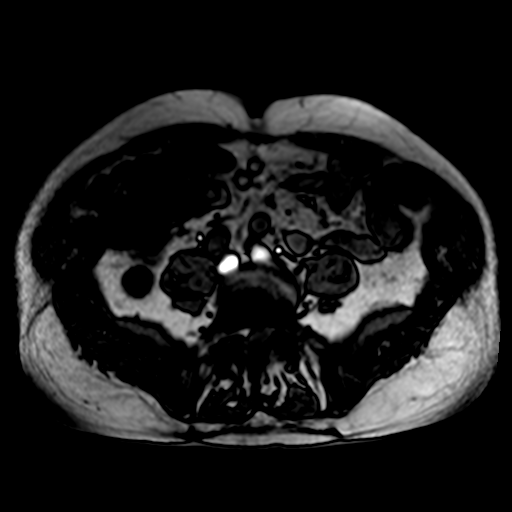
[im 30/60]
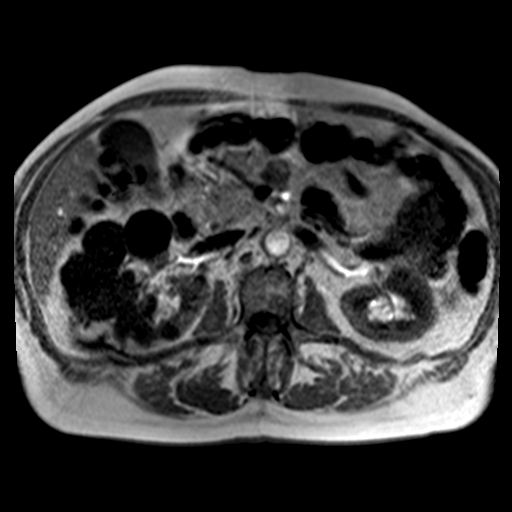
[im 60/60]
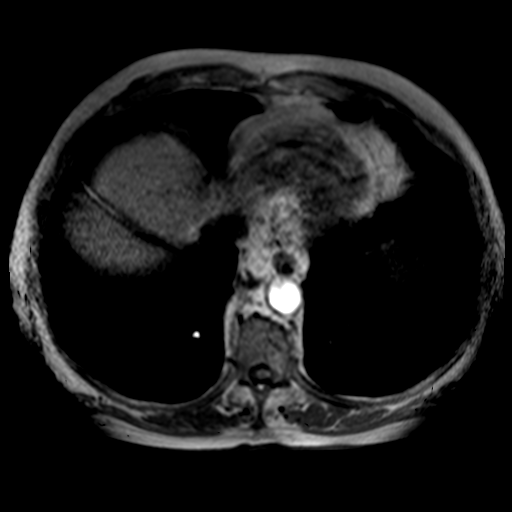

[Series 11: T1 dynamic · axial · non-contrast · 2.5mm · 0.74mm/px · z∈[-105,+53]mm · 3 of 64 slices shown]
[im 1/64]
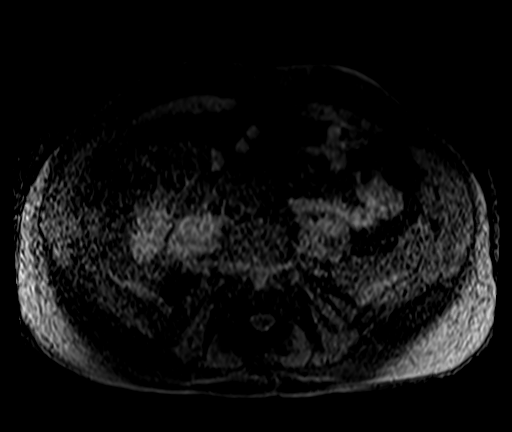
[im 32/64]
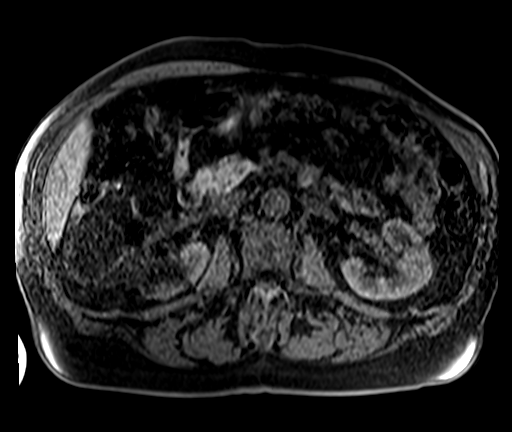
[im 64/64]
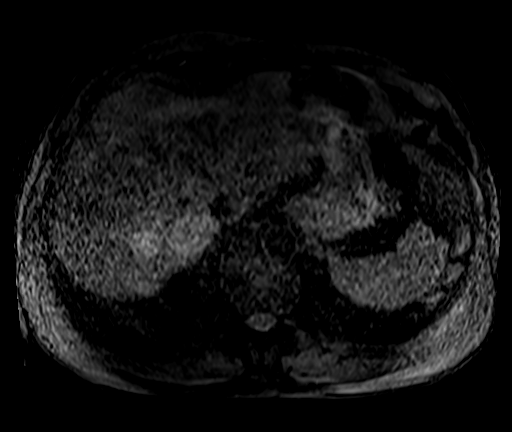

[Series 12: post 25 sec · axial · 2.5mm · 0.74mm/px · z∈[-105,+53]mm · 3 of 64 slices shown]
[im 1/64]
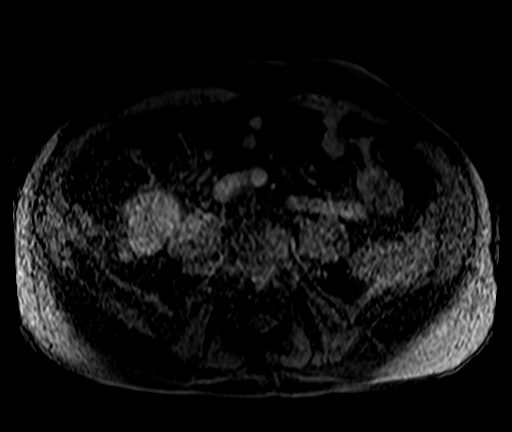
[im 32/64]
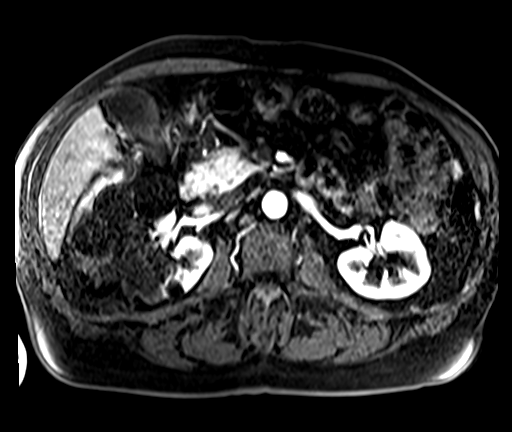
[im 64/64]
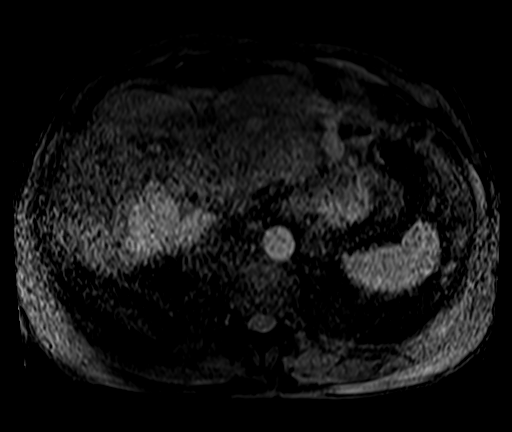

[Series 13: post 25 sec_sub · axial · 2.5mm · 0.74mm/px · z∈[-105,+53]mm · 3 of 64 slices shown]
[im 1/64]
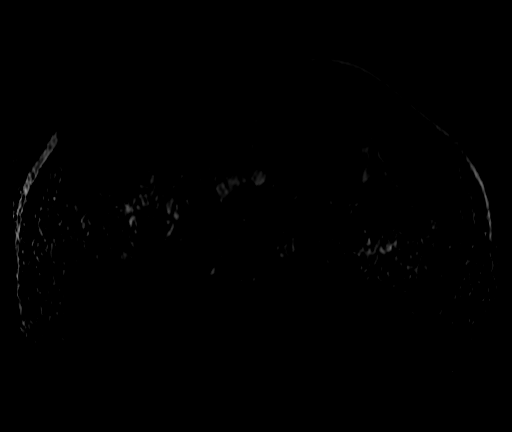
[im 32/64]
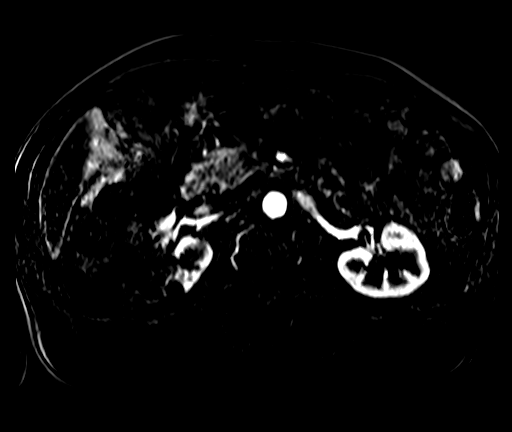
[im 64/64]
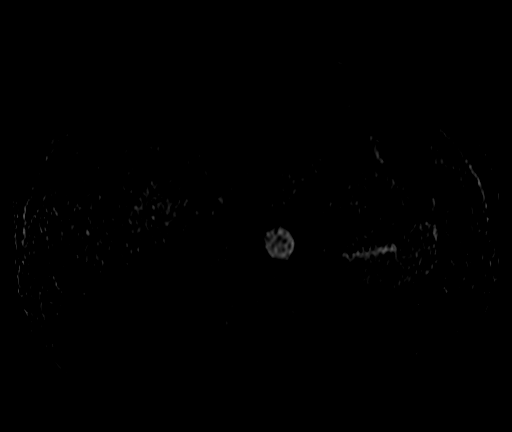

[Series 14: post 45 sec · axial · 2.5mm · 0.74mm/px · z∈[-105,+53]mm · 3 of 64 slices shown]
[im 1/64]
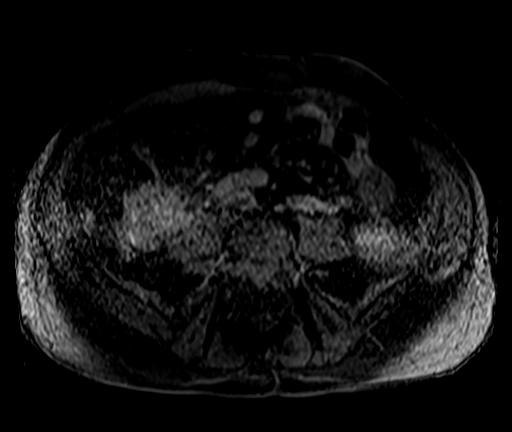
[im 32/64]
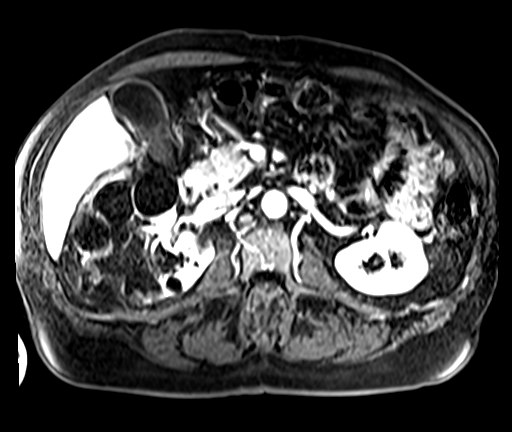
[im 64/64]
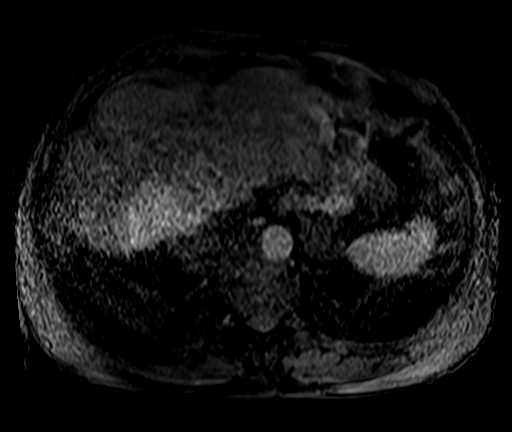

[Series 15: post 45 sec_sub · axial · 2.5mm · 0.74mm/px · z∈[-105,-27]mm · 2 of 64 slices shown]
[im 1/64]
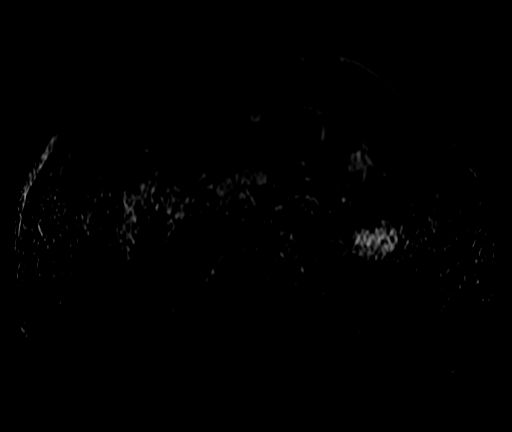
[im 32/64]
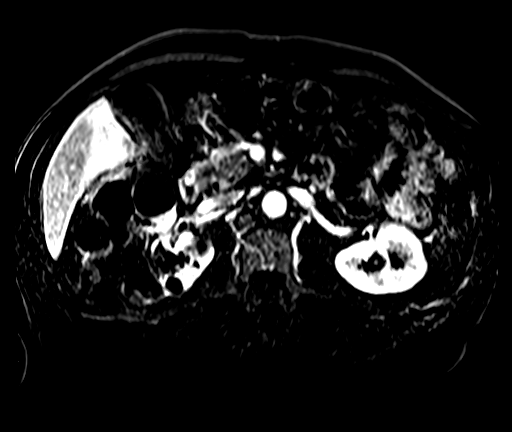

[32 of 48 positions shown; findings below may reference images not displayed]

FINDINGS: Lower chest: No acute findings.

Hepatobiliary: No hepatic masses identified. Aortic atherosclerotic
calcification noted.

Pancreas:  No mass or inflammatory changes.

Spleen: No evidence of splenomegaly. Several tiny T2 hyperintense
lesions are seen, largest measuring 1.3 cm and showing thin internal
septations suspicious for a small lymphangioma. These lesions are
almost certainly benign.

Adrenals/Urinary Tract: Both adrenal glands are normal in
appearance. Multiple benign-appearing cysts are seen in both
kidneys. In the upper pole of the right kidney, there is a complex
cystic lesion which is irregular shape and contains numerous thin
internal enhancing septations. No thick septations or enhancing
mural soft tissue nodules are seen. This measures 6.3 x 5.6 cm on
images 15 and 16/series 9. This has characteristics of a Bosniak
category 2F lesion. No solid masses identified. No evidence of
hydronephrosis.

Stomach/Bowel: Visualized portion unremarkable.

Vascular/Lymphatic: No pathologically enlarged lymph nodes
identified. No acute vascular findings.

Other:  None.

Musculoskeletal:  No suspicious bone lesions identified.
IMPRESSION: 6.3 cm complex cystic lesion in upper pole of right kidney,
consistent with a Bosniak category 2F lesion. Recommend continued
follow-up by MRI in 6 months. This recommendation follows ACR
consensus guidelines: Management of the Incidental Renal Mass on CT:
A White Paper of the ACR Incidental Findings Committee. [HOSPITAL] [WK];[DATE].

Other benign-appearing bilateral renal cysts. No evidence of
hydronephrosis.

Multiple small indeterminate splenic lesions, which are likely
benign. Recommend continued attention on follow-up imaging.

## 2020-10-28 MED ORDER — GADOBENATE DIMEGLUMINE 529 MG/ML IV SOLN
17.0000 mL | Freq: Once | INTRAVENOUS | Status: AC | PRN
  Administered 2020-10-28: 17 mL via INTRAVENOUS

## 2020-11-02 DIAGNOSIS — G609 Hereditary and idiopathic neuropathy, unspecified: Secondary | ICD-10-CM | POA: Insufficient documentation

## 2020-11-04 DIAGNOSIS — R2689 Other abnormalities of gait and mobility: Secondary | ICD-10-CM | POA: Insufficient documentation

## 2020-11-07 ENCOUNTER — Other Ambulatory Visit: Payer: Self-pay | Admitting: Orthopedic Surgery

## 2020-11-07 DIAGNOSIS — R296 Repeated falls: Secondary | ICD-10-CM

## 2020-11-07 DIAGNOSIS — M5459 Other low back pain: Secondary | ICD-10-CM

## 2020-11-09 ENCOUNTER — Telehealth: Payer: Self-pay | Admitting: Neurology

## 2020-11-09 NOTE — Telephone Encounter (Signed)
Agreed, he has neuro care at [facility] with an excellent movement disorder team.

## 2020-11-09 NOTE — Telephone Encounter (Signed)
Patient is being referred for a second opinion regarding frequent falls and balance issues. Patient previously followed Dr. Vickey Huger and transferred all care to [facility] neuro with Dr. Maple Hudson. Has been seeing Dr. Maple Hudson regarding these issues. [facility] is now sending patient here and requesting patient see Dr. Lucia Gaskins. Please advise if this is acceptable or if this should go to another academic center?

## 2020-11-16 ENCOUNTER — Ambulatory Visit
Admission: RE | Admit: 2020-11-16 | Discharge: 2020-11-16 | Disposition: A | Discharge status: Home / Self Care | Payer: Medicare Other | Source: Ambulatory Visit | Attending: Orthopedic Surgery | Admitting: Orthopedic Surgery

## 2020-11-16 DIAGNOSIS — R296 Repeated falls: Secondary | ICD-10-CM

## 2020-11-16 DIAGNOSIS — M5459 Other low back pain: Secondary | ICD-10-CM

## 2020-11-16 IMAGING — MR MR LUMBAR SPINE W/O CM
4 of 5 series · 27 of 48 positions shown · non-contrast
Comparison: No prior MRI, correlation is made with radiographs
[DATE] and CT abdomen pelvis [DATE]

CLINICAL DATA: Low back pain and bilateral leg pain.

EXAM:
MRI LUMBAR SPINE WITHOUT CONTRAST
TECHNIQUE: Multiplanar, multisequence MR imaging of the lumbar spine was
performed. No intravenous contrast was administered.

[Series 3: T2 · sagittal · 4.0mm · 1.09mm/px · 6 of 15 slices shown (1 of 2)]
[im 1/15]
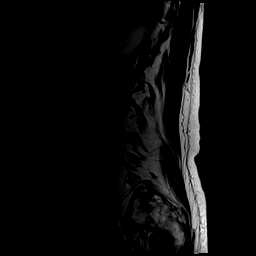
[im 3/15]
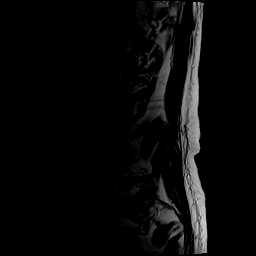
[im 6/15]
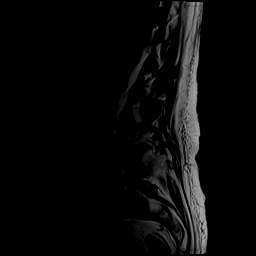
[im 9/15]
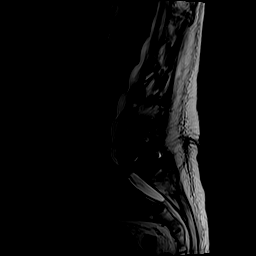
[im 12/15]
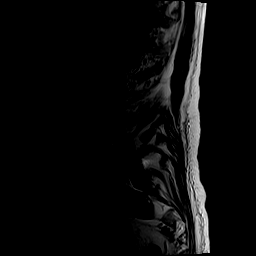
[im 15/15]
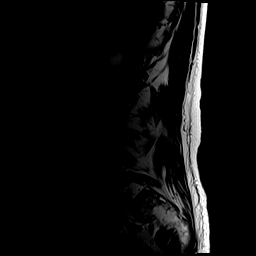

[Series 5: T1 · sagittal · 4.0mm · 1.09mm/px · 6 of 15 slices shown (1 of 2)]
[im 1/15]
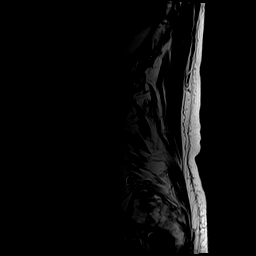
[im 3/15]
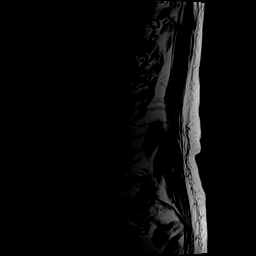
[im 6/15]
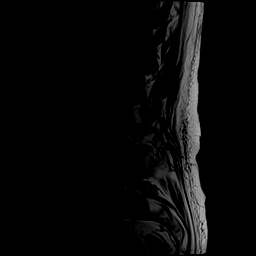
[im 9/15]
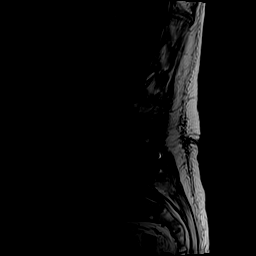
[im 12/15]
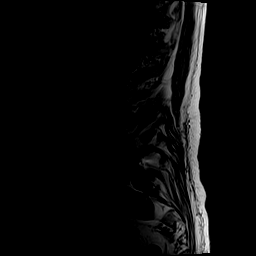
[im 15/15]
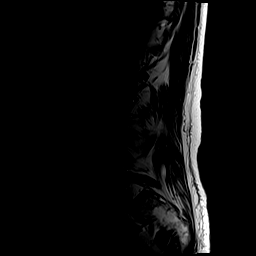

[Series 6: T2 · axial · 4.0mm · 0.39mm/px · z∈[-186,+50]mm · 9 of 41 slices shown (2 of 2)]
[im 1/41]
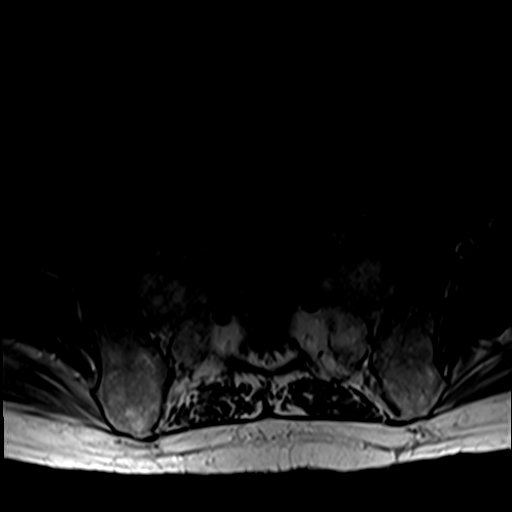
[im 6/41]
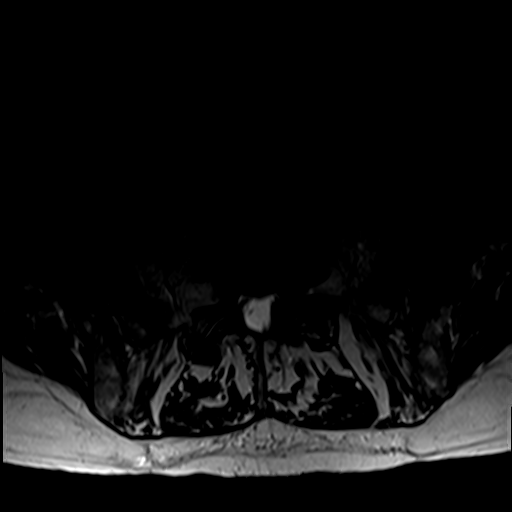
[im 12/41]
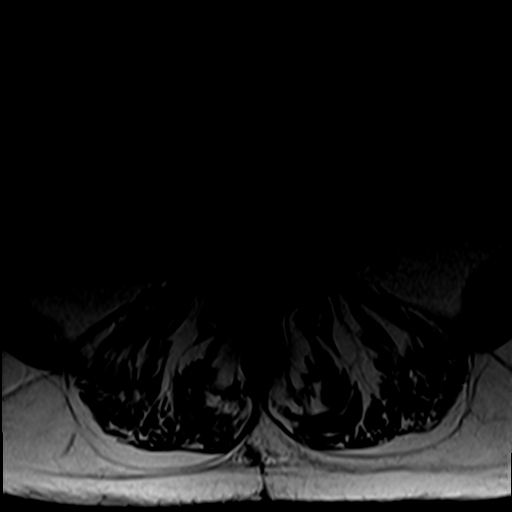
[im 18/41]
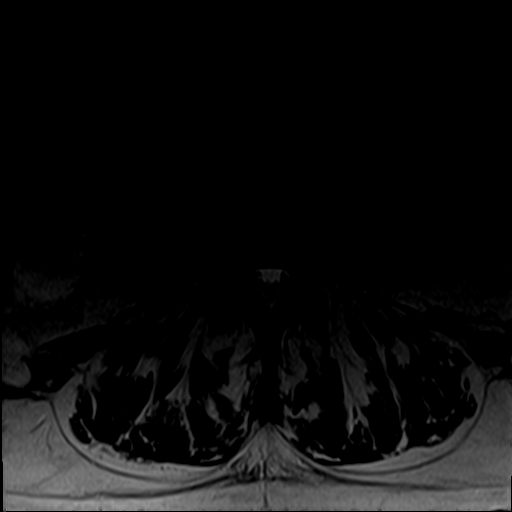
[im 21/41]
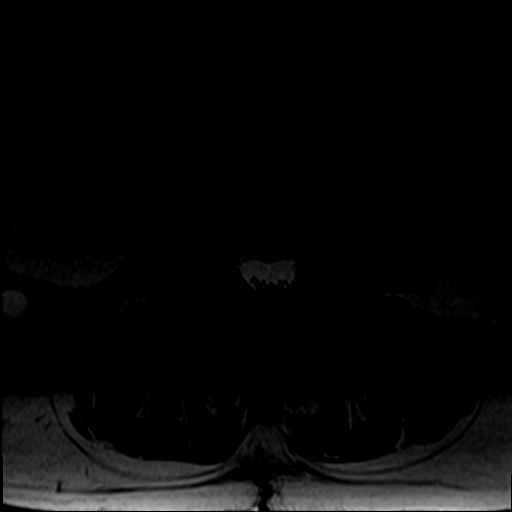
[im 23/41]
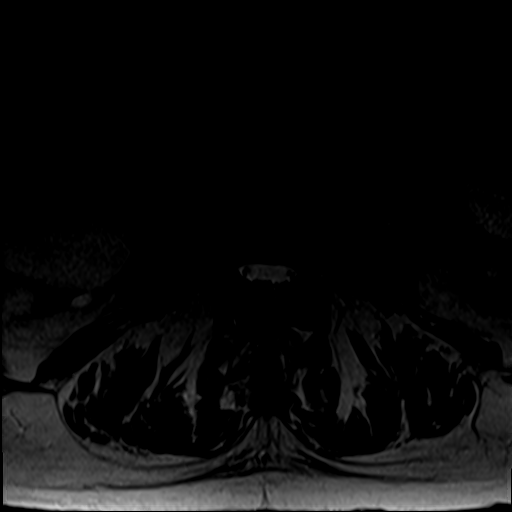
[im 29/41]
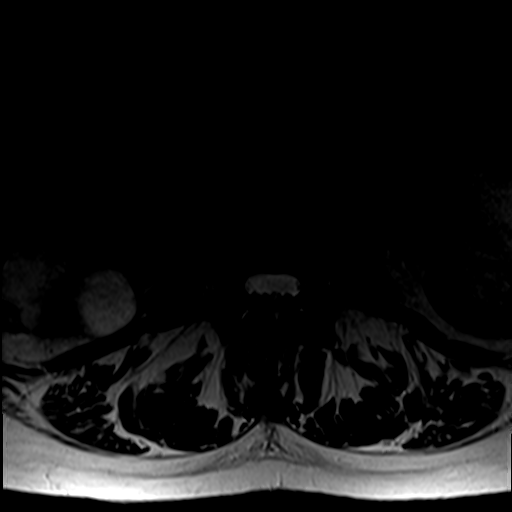
[im 35/41]
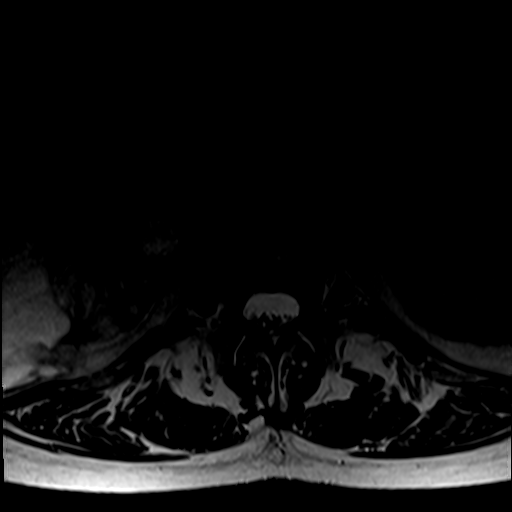
[im 41/41]
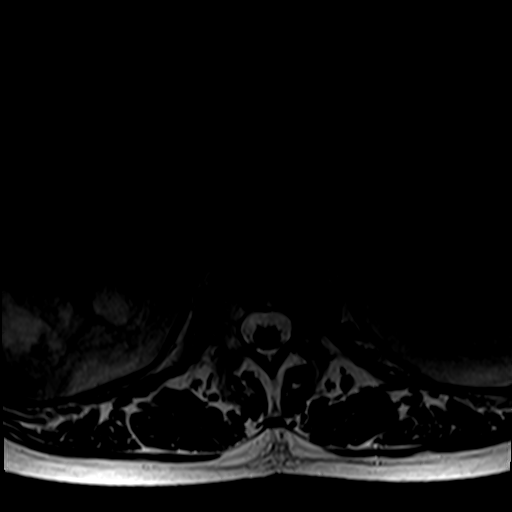

[Series 7: T1 · axial · 4.0mm · 0.39mm/px · z∈[-186,+21]mm · 6 of 41 slices shown (2 of 2)]
[im 1/41]
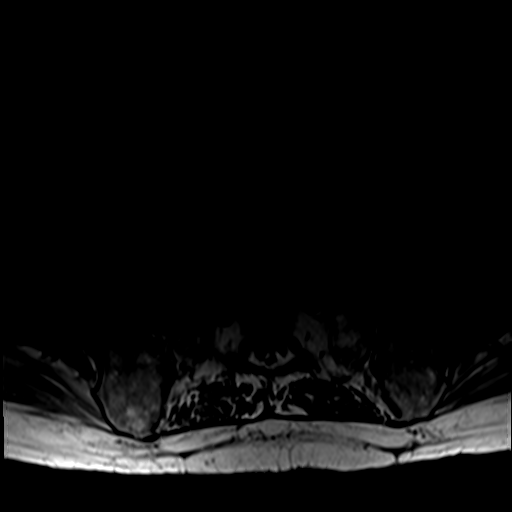
[im 6/41]
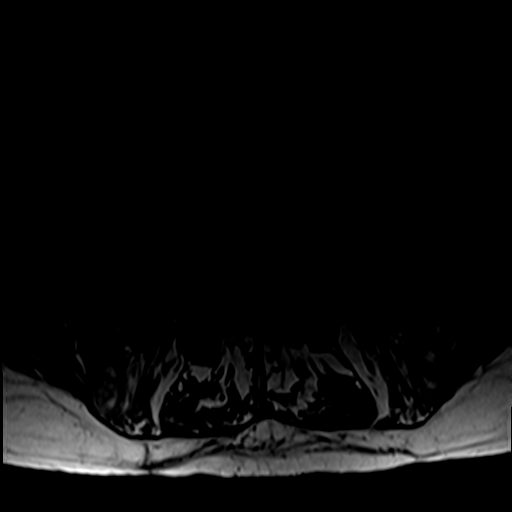
[im 12/41]
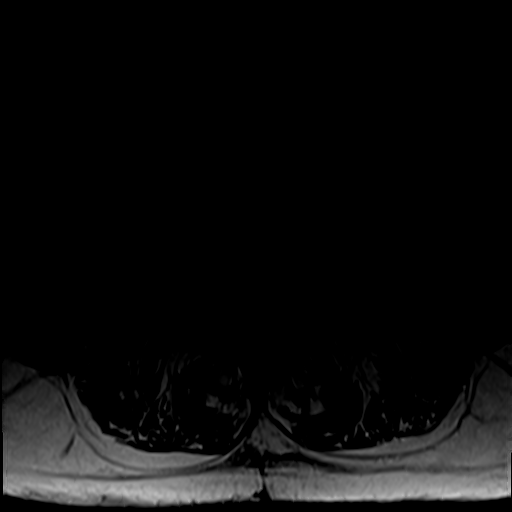
[im 18/41]
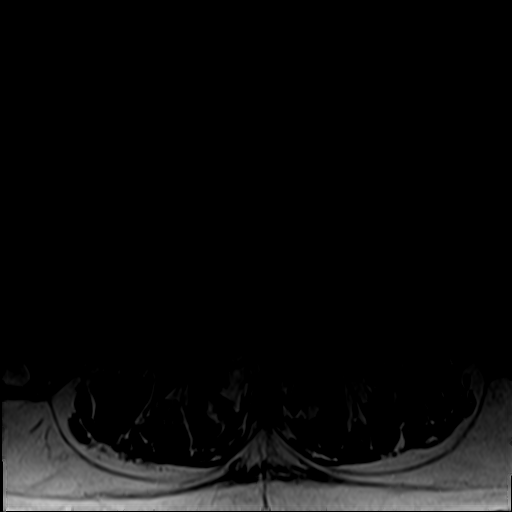
[im 21/41]
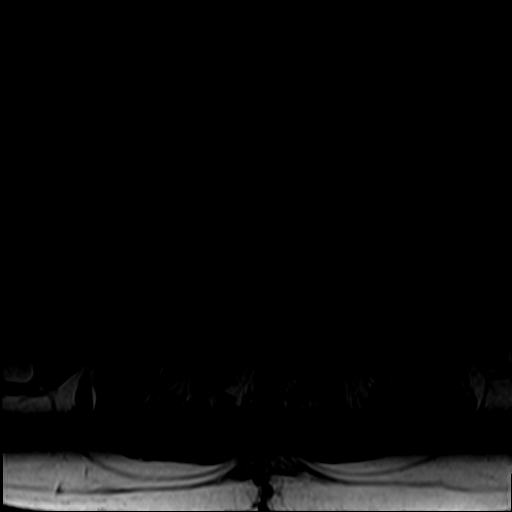
[im 35/41]
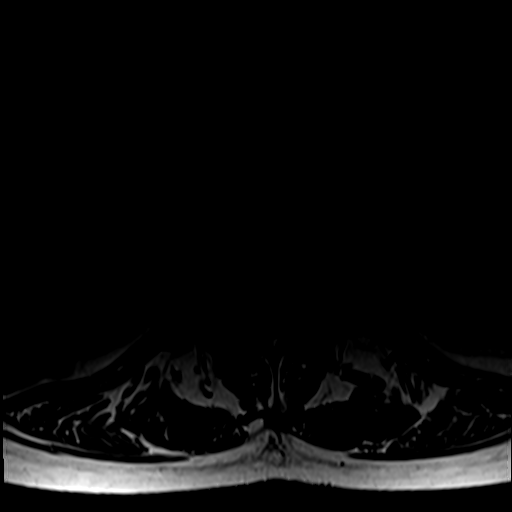

[27 of 48 positions shown; findings below may reference images not displayed]

FINDINGS: Segmentation:  Standard.

Alignment: Trace retrolisthesis T12 on L1 and L1 on L2. Grade 1
anterolisthesis L4 on L5.

Vertebrae: Redemonstrated bilateral L4 pars breaks. No acute
fracture, evidence of discitis, or suspicious bone lesion. Benign
hemangioma in the L1 vertebral body.

Conus medullaris and cauda equina: Conus extends to the L1 level.
Conus and cauda equina appear normal.

Paraspinal and other soft tissues: Susceptibility artifact from
spinous process fixation hardware L4-L5. Multiple large bilateral
renal cysts.

Disc levels:

T12-L1: Trace retrolisthesis with mild disc bulge. Mild facet
arthropathy. No spinal canal stenosis. No neural foraminal
narrowing.

L1-L2: Trace retrolisthesis with mild disc bulge. Mild facet
arthropathy. No spinal canal stenosis. No neural foraminal
narrowing.

L2-L3: Minimal disc bulge. Mild facet arthropathy. No spinal canal
stenosis. No neural foraminal narrowing.

L3-L4: Mild disc bulge. Moderate facet arthropathy. No spinal canal
stenosis. Mild bilateral neural foraminal narrowing.

L4-L5: Grade 1 anterolisthesis with disc unroofing. Severe facet
arthropathy and ligamentum flavum hypertrophy. Severe spinal canal
stenosis and moderate to severe left neural foraminal narrowing.

L5-S1: No significant disc bulge. Moderate facet arthropathy. No
spinal canal stenosis. No neural foraminal narrowing.
IMPRESSION: 1. L4-L5 grade 1 anterolisthesis, in part secondary to bilateral L4
pars defects, with severe spinal canal stenosis and moderate to
severe left neural foraminal narrowing.
2. Facet arthropathy throughout the lumbar spine.

## 2020-11-23 DIAGNOSIS — N281 Cyst of kidney, acquired: Secondary | ICD-10-CM | POA: Insufficient documentation

## 2020-12-01 ENCOUNTER — Ambulatory Visit: Payer: Self-pay | Admitting: Orthopedic Surgery

## 2020-12-03 NOTE — Progress Notes (Signed)
[facility] [address] [place] [address] Phone: [phone] Subjective:    I'm seeing this patient by the request  of:  Pearson, Michaelene Song, MD  CC: Back pain  INO:[id number]  Brandon Mendoza is a 80 y.o. male coming in with complaint of low back pain. Is scheduled for lumbar fusion at end of September. He began falling about a year ago and in June fell and had to be hospitalized for 10 days. Prior to this he had little to no symptoms. Patient states he is having bilateral sciatica pain. He gets numbness and tingling to the bottom of both feet intermittently. Daughter made him this appointment. He wants to know what he can do for the pain until his surgery.  Patient does have an MRI.  MRI of patient's back was independently visualized by me.  MRI does show that there is an L4-L5 grade 1 spondylolisthesis with a bilateral pars defect causing severe spinal stenosis and moderate foraminal narrowing on the left side.    Past Medical History:  Diagnosis Date   Anxiety    Carotid artery occlusion    COPD (chronic obstructive pulmonary disease) (HCC)    Depression    Hx of colonic polyps    Hyperlipidemia    Hypertension    Nocturia    Peripheral vascular disease (HCC)    Sleep apnea    on 6 cm, nasal pillow   Stroke (HCC) 05/01/2011   ischemic   Past Surgical History:  Procedure Laterality Date   CAROTID ENDARTERECTOMY  12/09/11   Left cea   ENDARTERECTOMY  12/09/2011   Procedure: ENDARTERECTOMY CAROTID;  Surgeon: Larina Earthly, MD;  Location: [facility] OR;  Service: Vascular;  Laterality: Left;   EYE SURGERY     rt retina detachment   HAMMER TOE SURGERY  2004   HERNIA REPAIR  2006   SHOULDER ARTHROSCOPY W/ ROTATOR CUFF REPAIR  2010   TONSILLECTOMY     Social History   Socioeconomic History   Marital status: Married    Spouse name: [name]   Number of children: 4   Years of education: College   Highest education level: Not on file  Occupational  History   Occupation: Retired  Tobacco Use   Smoking status: Former    Packs/day: 1.00    Years: 50.00    Pack years: 50.00    Types: Cigarettes    Quit date: 08/26/2004    Years since quitting: 16.2   Smokeless tobacco: Never   Tobacco comments:    quit smoking 08/2004  Substance and Sexual Activity   Alcohol use: Yes    Alcohol/week: 14.0 standard drinks    Types: 14 Shots of liquor per week    Comment: 2-3 daily cocktails   Drug use: No   Sexual activity: Not on file  Other Topics Concern   Not on file  Social History Narrative   Patient is married with 4 children.   Patient is right handed.   Patient has college education.   Patient drinks 3 cups daily.   Social Determinants of Health   Financial Resource Strain: Not on file  Food Insecurity: Not on file  Transportation Needs: Not on file  Physical Activity: Not on file  Stress: Not on file  Social Connections: Not on file   No Known Allergies Family History  Problem Relation Age of Onset   Alzheimer's disease Father    Diabetes Other  Current Outpatient Medications (Cardiovascular):    flecainide (TAMBOCOR) 100 MG tablet, Take 100 mg by mouth 2 (two) times daily.   lisinopril (PRINIVIL,ZESTRIL) 10 MG tablet, Take 10 mg by mouth every evening.   rosuvastatin (CRESTOR) 10 MG tablet, Take 10 mg by mouth at bedtime.   Current Outpatient Medications (Analgesics):    acetaminophen (TYLENOL) 500 MG tablet, Take 1,000 mg by mouth every 6 (six) hours as needed for mild pain or headache.   traMADol (ULTRAM) 50 MG tablet, Take 1 tablet (50 mg total) by mouth every 6 (six) hours as needed.  Current Outpatient Medications (Hematological):    ferrous sulfate 325 (65 FE) MG EC tablet, Take 1 tablet (325 mg total) by mouth 2 (two) times daily. (Patient not taking: No sig reported)  Current Outpatient Medications (Other):    ALPRAZolam (XANAX) 0.5 MG tablet, Take 0.25 mg by mouth at bedtime.   Ascorbic Acid (VITAMIN C  PO), Take 2 tablets by mouth in the morning.   lidocaine (LIDODERM) 5 %, Place 1 patch onto the skin daily. Remove & Discard patch within 12 hours or as directed by MD (Patient not taking: Reported on 12/04/2020)   methocarbamol (ROBAXIN) 500 MG tablet, Take 1 tablet (500 mg total) by mouth 2 (two) times daily as needed for muscle spasms. (Patient not taking: Reported on 12/04/2020)   Multiple Vitamins-Minerals (MULTIVITAMINS THER. W/MINERALS) TABS, Take 1 tablet by mouth daily.   sertraline (ZOLOFT) 100 MG tablet, Take 1 tablet (100 mg total) by mouth daily.   silver sulfADIAZINE (SILVADENE) 1 % cream, Apply 1 application topically daily as needed (to bruised areas).   Reviewed prior external information including notes and imaging from  primary care provider As well as notes that were available from care everywhere and other healthcare systems.  This includes patient's MRI previously.  Past medical history, social, surgical and family history all reviewed in electronic medical record.  No pertanent information unless stated regarding to the chief complaint.   Review of Systems:  No headache, visual changes, nausea, vomiting, diarrhea, constipation, dizziness, abdominal pain, skin rash, fevers, chills, night sweats, weight loss, swollen lymph nodes, body aches, joint swelling, chest pain, shortness of breath, mood changes. POSITIVE muscle aches especially the backs  Objective  Height 5\' 8"  (1.727 m). Height 5\' 8"  (1.727 m).   General: No apparent distress alert and oriented x3 mood and affect normal, dressed appropriately.  HEENT: Pupils equal, extraocular movements intact  Respiratory: Patient's speak in full sentences and does not appear short of breath  Gait ambulates with the aid of a cane MSK: Patient has significant tenderness noted.  Loss of lordosis.  Sitting comfortably though in the chair.    Impression and Recommendations:     The above documentation has been reviewed and is  accurate and complete , DO

## 2020-12-04 ENCOUNTER — Ambulatory Visit (INDEPENDENT_AMBULATORY_CARE_PROVIDER_SITE_OTHER): Payer: Medicare Other | Admitting: Family Medicine

## 2020-12-04 ENCOUNTER — Other Ambulatory Visit: Payer: Self-pay

## 2020-12-04 DIAGNOSIS — M48061 Spinal stenosis, lumbar region without neurogenic claudication: Secondary | ICD-10-CM | POA: Insufficient documentation

## 2020-12-04 DIAGNOSIS — M48062 Spinal stenosis, lumbar region with neurogenic claudication: Secondary | ICD-10-CM

## 2020-12-04 NOTE — Patient Instructions (Addendum)
Unfortunately surgery is the right step at this moment If you have question please feel free to contact us Can consider manipulation 12 weeks after surgery if you would like Do prescribed exercises at least 3x a week

## 2020-12-04 NOTE — Assessment & Plan Note (Signed)
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  Patient does have what appears to be even some atrophy of the musculature of the lower extremities.  Patient has had longstanding spinal stenosis that seems to be fairly severe with some progression.  Patient has failed all other conservative therapy including epidurals, medications, and patient is concerning for a fall risk for anything more aggressive at the moment.  Patient wanting to know if potentially osteopathic manipulation would be beneficial but at this point I recommend the patient does unfortunately need some type of surgical intervention.  We discussed that after 12 weeks patient could follow-up with me again and we can discuss then about more preventative care.  Patient can contact us with any type of questions if necessary.  Follow-up with me as needed otherwise.  Total time with patient discussing different treatment options, reviewing the MRI greater than 46 minutes

## 2020-12-09 ENCOUNTER — Ambulatory Visit: Payer: Self-pay | Admitting: Orthopedic Surgery

## 2020-12-09 NOTE — Progress Notes (Signed)
Surgical Instructions   Your procedure is scheduled on Thursday 12/24/2020.  Report to [facility] Main Entrance "A" at 10:30 A.M., then check in with the Admitting office.  Call [phone] if you have problems or questions between now and the morning of surgery:   Remember: Do not eat after midnight the night before your surgery  You may drink clear liquids until 09:30am the morning of your surgery.   Clear liquids allowed are: Water, Non-Citrus Juices (without pulp), Carbonated Beverages, Clear Tea, Black Coffee Only (NO MILK, CREAM, or POWDERED CREAMER of any kind), and Gatorade   Take these medicines the morning of surgery with A SIP OF WATER:  Flecainide (Tambocor) Sertraline (Zoloft)  If needed you may take these medications the morning of surgery: Acetaminophen (Tylenol) Tramadol (Ultram)   As of today, STOP taking any Aspirin (unless otherwise instructed by your surgeon) or Aspirin-containing products; NSAIDS - Aleve, Naproxen, Ibuprofen, Motrin, Advil, Goody's, BC's, all herbal medications, fish oil, and all vitamins.          Do not wear jewelry  Do not wear lotions, powders, colognes, or deodorant. Do not shave 48 hours prior to surgery.  Men may shave face and neck. Do not bring valuables to the hospital - [facility] is not responsible for any belongings or valuables.              Do NOT Smoke (Tobacco/Vaping) or drink Alcohol 24 hours prior to your procedure  If you use a CPAP at night, you may bring all equipment for your overnight stay.   Contacts, glasses, hearing aids, dentures or partials may not be worn into surgery, please bring cases for these belongings   For patients admitted to the hospital, discharge time will be determined by your treatment team.   Patients discharged the day of surgery will not be allowed to drive home, and someone needs to stay with them for 24 hours.  ONLY ONE (1) SUPPORT PERSON MAY WAIT IN THE WAITING AREA WHILE YOU ARE IN  SURGERY. NO VISITORS WILL BE ALLOWED IN PRE-OP WHERE PATIENTS GET READY FOR SURGERY.  TWO (2) VISITORS WILL BE ALLOWED IN YOUR ROOM IF YOU ARE ADMITTED AFTER SURGERY.  Minor children may have two parents present. Special consideration for safety and communication needs will be reviewed on a case by case basis.  Special instructions:    Oral Hygiene is also important to reduce your risk of infection.  Remember - BRUSH YOUR TEETH THE MORNING OF SURGERY WITH YOUR REGULAR TOOTHPASTE   [facility] Preparing For [facility]  Before surgery, you can play an important role. Because skin is not sterile, your skin needs to be as free of germs as possible. You can reduce the number of germs on your skin by washing with CHG (chlorahexidine gluconate) Soap before surgery.  CHG is an antiseptic cleaner which kills germs and bonds with the skin to continue killing germs even after washing.     Please do not use if you have an allergy to CHG or antibacterial soaps. If your skin becomes reddened/irritated stop using the CHG.  Do not shave (including legs and underarms) for at least 48 hours prior to first CHG shower. It is OK to shave your face.  Please follow these instructions carefully.     Shower the NIGHT BEFORE SURGERY and the MORNING OF SURGERY with CHG Soap.   If you chose to wash your hair, wash your hair first as usual with your normal shampoo. After  you shampoo, rinse your hair and body thoroughly to remove the shampoo.    Then [employer] and genitals (private parts) with your normal soap and rinse thoroughly to remove soap.  Next use the CHG Soap as you would any other liquid soap. You can apply CHG directly to the skin and wash gently with a clean washcloth.   Apply the CHG Soap to your body ONLY FROM THE NECK DOWN.  Do not use on open wounds or open sores. Avoid contact with your eyes, ears, mouth and genitals (private parts). Wash Face and genitals (private parts)  with your normal soap.   Wash  thoroughly, paying special attention to the area where your surgery will be performed.  Thoroughly rinse your body with warm water from the neck down.  DO NOT shower/wash with your normal soap after using and rinsing off the CHG Soap.  Pat yourself dry with a CLEAN TOWEL.  Wear CLEAN PAJAMAS to bed the night before surgery  Place CLEAN SHEETS on your bed the night before your surgery  DO NOT SLEEP WITH PETS.   Day of Surgery:  Take a shower with CHG soap. Wear Clean/Comfortable clothing the morning of surgery Do not apply any deodorants/lotions.   Remember to brush your teeth WITH YOUR REGULAR TOOTHPASTE.   Please read over the following fact sheets that you were given.

## 2020-12-09 NOTE — Progress Notes (Signed)
Order for consent does not read a specific body part that is being worked on.  It states "Posterior decompression and fusion."  I called and LVM with Rosalva Ferron, surgical coordinator for Dr. [name] requesting a new order that stated what part was being worked on.

## 2020-12-10 ENCOUNTER — Encounter (HOSPITAL_COMMUNITY)
Admission: RE | Admit: 2020-12-10 | Discharge: 2020-12-10 | Disposition: A | Discharge status: Home / Self Care | Payer: Medicare Other | Source: Ambulatory Visit | Attending: Orthopedic Surgery | Admitting: Orthopedic Surgery

## 2020-12-10 ENCOUNTER — Encounter (HOSPITAL_COMMUNITY): Payer: Self-pay

## 2020-12-10 ENCOUNTER — Other Ambulatory Visit: Payer: Self-pay

## 2020-12-10 DIAGNOSIS — M4316 Spondylolisthesis, lumbar region: Secondary | ICD-10-CM | POA: Diagnosis not present

## 2020-12-10 DIAGNOSIS — Z01812 Encounter for preprocedural laboratory examination: Secondary | ICD-10-CM | POA: Diagnosis present

## 2020-12-10 HISTORY — DX: Cardiac arrhythmia, unspecified: I49.9

## 2020-12-10 LAB — URINALYSIS, ROUTINE W REFLEX MICROSCOPIC
Bilirubin Urine: NEGATIVE
Glucose, UA: NEGATIVE mg/dL
Hgb urine dipstick: NEGATIVE
Ketones, ur: NEGATIVE mg/dL
Leukocytes,Ua: NEGATIVE
Nitrite: NEGATIVE
Protein, ur: NEGATIVE mg/dL
Specific Gravity, Urine: 1.014 (ref 1.005–1.030)
pH: 5 (ref 5.0–8.0)

## 2020-12-10 LAB — CBC
HCT: 46.6 % (ref 39.0–52.0)
Hemoglobin: 16 g/dL (ref 13.0–17.0)
MCH: 31.3 pg (ref 26.0–34.0)
MCHC: 34.3 g/dL (ref 30.0–36.0)
MCV: 91 fL (ref 80.0–100.0)
Platelets: 265 10*3/uL (ref 150–400)
RBC: 5.12 MIL/uL (ref 4.22–5.81)
RDW: 12.3 % (ref 11.5–15.5)
WBC: 7.4 10*3/uL (ref 4.0–10.5)
nRBC: 0 % (ref 0.0–0.2)

## 2020-12-10 LAB — BASIC METABOLIC PANEL
Anion gap: 10 (ref 5–15)
BUN: 21 mg/dL (ref 8–23)
CO2: 26 mmol/L (ref 22–32)
Calcium: 9.6 mg/dL (ref 8.9–10.3)
Chloride: 101 mmol/L (ref 98–111)
Creatinine, Ser: 1.18 mg/dL (ref 0.61–1.24)
GFR, Estimated: >60 mL/min (ref >60)
Glucose, Bld: 72 mg/dL (ref 70–99)
Potassium: 4 mmol/L (ref 3.5–5.1)
Sodium: 137 mmol/L (ref 135–145)

## 2020-12-10 LAB — PROTIME-INR
INR: 1 (ref 0.8–1.2)
Prothrombin Time: 13.5 seconds (ref 11.4–15.2)

## 2020-12-10 LAB — APTT: aPTT: 31 seconds (ref 24–36)

## 2020-12-10 LAB — SURGICAL PCR SCREEN
MRSA, PCR: NEGATIVE
Staphylococcus aureus: NEGATIVE

## 2020-12-10 NOTE — Progress Notes (Signed)
PCP: Tonny Bollman, DO Cardiologist: Sandy Salaam, [place]  EKG: 09/14/20 CXR: na ECHO: 11/15/19 Stress Test: denies Cardiac Cath: denies  Fasting Blood Sugar- na Checks Blood Sugar__na_ times a day  OSA/CPAP: Yes, wears nightly.  Patient states he will not bring DOS  ASA/Blood Thinner: No.  Stopped Eliquis in June 2022 due to hematoma  Covid test scheduled 9/26 at 10:30  Anesthesia Review: Yes, per order and patient has loop recorder.  Patient saw Dr. Rudolpho Sevin, cards 12/08/20.  States Dr. Has form for cardiac clearance.    Patient denies shortness of breath, fever, cough, and chest pain at PAT appointment.  Patient verbalized understanding of instructions provided today at the PAT appointment.  Patient asked to review instructions at home and day of surgery.

## 2020-12-11 ENCOUNTER — Ambulatory Visit: Payer: Self-pay | Admitting: Orthopedic Surgery

## 2020-12-11 ENCOUNTER — Encounter (HOSPITAL_COMMUNITY): Payer: Self-pay

## 2020-12-11 NOTE — H&P (Signed)
Subjective:   Brandon Mendoza is a pleasant 80 year old male who presents today for preop history and physical.  He is scheduled for POST DECOMP AND FUSION L4-L5 With Dr. [name] at [facility].  His chief complaint is low back pain, bilateral lower extremity pain and episodic weakness and balance difficulties.  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   History of carotid endarterectomy 04/10/2014   Essential hypertension 04/10/2014   Aftercare following surgery of the circulatory system, NEC 03/13/2013   Numbness- Left neck 03/13/2013   Occlusion and stenosis of carotid artery without mention of cerebral infarction 12/15/2011   HTN (hypertension), malignant 12/01/2011   Habitual alcohol use 12/01/2011   Carotid artery stenosis, symptomatic 11/30/2011   CVA (cerebral infarction) 11/29/2011   Hyperlipidemia    Past Medical History:  Diagnosis Date   Anxiety    Carotid artery occlusion    COPD (chronic obstructive pulmonary disease) (HCC)    Depression    Hx of colonic polyps    Hyperlipidemia    Hypertension    Nocturia    Peripheral vascular disease (HCC)    Sleep apnea    on 6 cm, nasal pillow   Stroke (HCC) 05/01/2011   ischemic    Past Surgical History:  Procedure Laterality Date   CAROTID ENDARTERECTOMY  12/09/11   Left cea   ENDARTERECTOMY  12/09/2011   Procedure: ENDARTERECTOMY CAROTID;  Surgeon: Larina Earthly, MD;  Location: [facility] OR;  Service: Vascular;  Laterality: Left;   EYE SURGERY     rt  retina detachment   HAMMER TOE SURGERY  2004   HERNIA REPAIR  2006   SHOULDER ARTHROSCOPY W/ ROTATOR CUFF REPAIR  2010   TONSILLECTOMY      Current Outpatient Medications  Medication Sig Dispense Refill Last Dose   acetaminophen (TYLENOL) 500 MG tablet Take 1,000 mg by mouth every 6 (six) hours as needed for mild pain or headache.      ALPRAZolam (XANAX) 0.5 MG tablet Take 0.25 mg by mouth at bedtime.      Ascorbic Acid (VITAMIN C PO) Take 2 tablets by mouth in the morning.      ferrous sulfate 325 (65 FE) MG EC tablet Take 1 tablet (325 mg total) by mouth 2 (two) times daily. (Patient not taking: No sig reported) 60 tablet 3    flecainide (TAMBOCOR) 100 MG tablet Take 100 mg by mouth 2 (two) times daily.      lidocaine (LIDODERM) 5 % Place 1 patch onto the skin daily. Remove & Discard patch within 12 hours or as directed by MD (Patient not taking: Reported on 12/04/2020) 30 patch 0    lisinopril (PRINIVIL,ZESTRIL) 10 MG tablet Take 10 mg by mouth every evening.      methocarbamol (ROBAXIN) 500 MG tablet Take 1 tablet (500 mg total) by mouth 2 (two) times daily as needed for muscle spasms. (Patient not taking: Reported on 12/04/2020) 14 tablet 0    Multiple Vitamins-Minerals (MULTIVITAMINS THER. W/MINERALS) TABS Take 1 tablet by mouth daily.  rosuvastatin (CRESTOR) 10 MG tablet Take 10 mg by mouth at bedtime.      sertraline (ZOLOFT) 100 MG tablet Take 1 tablet (100 mg total) by mouth daily. 15 tablet 0    silver sulfADIAZINE (SILVADENE) 1 % cream Apply 1 application topically daily as needed (to bruised areas).      traMADol (ULTRAM) 50 MG tablet Take 1 tablet (50 mg total) by mouth every 6 (six) hours as needed. 20 tablet 0    No current facility-administered medications for this visit.   No Known Allergies  Social History   Tobacco Use   Smoking status: Former    Packs/day: 1.00    Years: 50.00    Pack years: 50.00    Types: Cigarettes    Quit date: 08/26/2004    Years since  quitting: 16.3   Smokeless tobacco: Never   Tobacco comments:    quit smoking 08/2004  Substance Use Topics   Alcohol use: Not Currently    Alcohol/week: 14.0 standard drinks    Types: 14 Shots of liquor per week    Comment: 2-3 daily cocktails    Family History  Problem Relation Age of Onset   Alzheimer's disease Father    Diabetes Other     Review of Systems Pertinent items are noted in HPI.  Objective:   Vitals: Ht: 5 ft 8 in 12/11/2020 02:08 pm Wt: 183.5 lbs 12/11/2020 02:08 pm BMI: 27.9 12/11/2020 02:08 pm BP: 118/92 12/11/2020 02:09 pm O2Sat: 93% 12/11/2020 02:09 pm  General: AAOX3, well developed and well nourished, NAD Ambulation: abnormal gait pattern due to balance issues, uses cane assistive device. Heart: Regular rate and rhythm, no rubs, murmurs, or gallops Lungs: Bilaterally Abdomen: Bowel sounds 4, nontender, nondistended, no rebound tenderness. No loss of bladder or bowel control Inspection: Multiple skin abrasions on knees from frequent falls. Palpation: Non-tender over spinous processes and paraspinal muscles. No SI joint tenderness. AROM: - No significant pain with ROM of lumbar spine. - Knee: flexion and extension normal and pain free bilaterally. - Ankle: Dorsiflexion, plantarflexion, inversion, eversion normal and pain free. Dermatomes: Lower extremity sensation to light touch intact bilaterally. Occassional dysethesias in bilateral legs when standing or walking Myotomes: - 5/5 LE motor strength bilaterally. Reflexes: - Patella: Left2+, Right 2+ - Achilles: Left2+, Right 2+ - Babinski: Left Ngative, Right Negative - Clonus: Negative - Hoffmann negative Special Tests: - Straight Leg Raise: Left Negative, Right Negative - Heel to toe ambulation demonstrate severe balance issues. Romberg test also demonstrates balance issues PV: Extremities warm and well profused. No LE Edema  He continues to have significant episodes of weakness in the lower  extremity right side worse than the left and moderate low back pain. He also complains of intermittent dysesthesias and neuropathic pain into the lower extremities. Lumbar MRI: completed on 11/16/20 was reviewed with the patient. It was completed at [facility] imaging; I have independently reviewed the images as well as the radiology report. Multiple large bilateral renal cysts. 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 Severe spinal stenosis L4-5. Mild degenerative disease with no significant stenosis at the remaining levels. No acute fracture seen.  Assessment:   Brandon Mendoza is a very active pleasant 80 year old gentleman who had an interspinous process device placed at L4-5 but unfortunately continues to have severe back and radicular leg pain with weakness into the lower extremity right worse than the left. Because of the weakness he has had multiple falls. He presents today for discussion about treatment options.  Based on  his clinical exam and imaging studies he has ongoing spinal stenosis with spondylolisthesis at L4-5. Unfortunately do not think the interspinous process device was helpful and obtaining an indirect decompression. At this point I think surgical option would be to remove the interspinous process device and to a formal decompression at L4-5. Because of the spondylolisthesis I would then supplement this with pedicle screw fixation and a posterior lateral arthrodesis. Gone over this procedure with the patient and his daughter including the risks, benefits, and alternatives. The goal of surgery be to reduce his pain, improve his weakness, and improve his overall quality-of-life.  Risks and benefits of surgery were discussed with the patient. These include: Infection, bleeding, death, stroke, paralysis, ongoing or worse pain, need for additional surgery, leak of spinal fluid, adjacent segment degeneration requiring additional surgery, post-operative hematoma formation that can result in  neurological compromise and the need for urgent/emergent re-operation. Loss in bowel and bladder control. Injury to major vessels that could result in the need for urgent abdominal surgery to stop bleeding. Risk of deep venous thrombosis (DVT) and the need for additional treatment. Recurrent disc herniation resulting in the need for revision surgery, which could include fusion surgery (utilizing instrumentation such as pedicle screws and intervertebral cages). Additional risk: If instrumentation is used there is a risk of migration, or breakage of that hardware that could require additional surgery.  Risks and benefits of surgery were discussed with the patient. These include: Infection, bleeding, death, stroke, paralysis, ongoing or worse pain, need for additional surgery, nonunion, leak of spinal fluid, adjacent segment degeneration requiring additional fusion surgery, Injury to abdominal vessels that can require anterior surgery to stop bleeding. Malposition of the cage and/or pedicle screws that could require additional surgery. Loss of bowel and bladder control. Postoperative hematoma causing neurologic compression that could require urgent or emergent re-operation.   Plan:   L4-5 removal of interspinous device, decompression, and fusion.  We have obtained preoperative medical clearance from the patient's primary care provider and cardiologist.  I reviewed the patient's medication list. He is not currently on any blood thinners. That using any aspirin products. Not taking any anti-inflammatory medications. He will stop his over-the-counter vitamins and fish oil.  We have also discussed the post-operative recovery period to include: bathing/showering restrictions, wound healing, activity (and driving) restrictions, medications/pain mangement.  We have also discussed post-operative redflags to include: signs and symptoms of postoperative infection, DVT/PE.  Patient is scheduled for LSO brace  fitting on 9/19 with physical therapy.  All patients questions were invited and answered. Patient given a copy of discharge instructions  Follow-up: 2 weeks postop

## 2020-12-11 NOTE — Progress Notes (Signed)
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  History includes former smoker (quit 08/26/04), HTN, HLD, CVA (05/01/11), carotid artery stenosis (left carotid endarterectomy for symptomatic disease 12/09/11), PVD, dysrhythmia (PAF, atrial tachycardia), OSA (wears CPAP), COPD. He is s/p  loop recorder on 10/22/20 for further evaluation for recurrent falls, which may be related to lumbar stenosis or dysequilibrium. No current alcohol use documented, but previously had 2-3 cocktails per day. MRI Abd 10/28/20 (ordered by Karlene Lineman, PA-C) showed a complex right renal cyst with six month follow-up recommended.  - Admit to [facility] 09/14/20-09/17/20 for fall with large right thigh hematoma in setting of Eliquis for PAF. Kcentra given on admission. Orthopedics consulted and managed medically. Transfused with 1 unit PRBC for ABL anemia. Decision made to discontinue Elilquis given recurrent falls and resulting hematoma. Out-patient cardiology and orthopedic follow-up.   Patient was last seen by EP cardiologist Dr. Rudolpho Sevin on 12/08/20 for preoperative evaluation. Interrogation of his loop recorder so far showed some atrial tachycardia, but no afib. By notes, it sounds like initial Ziopatch showed infrequent PAF, but more recent Ziopatch 02/2020 show afib, but did show some paroxysmal atrial tachycardia.  PAF has been treated with flecainide, but not on anticoagulation due to recurrent falls with hematoma in June 2022. He wrote, "Recurrent fall secondary to lumbar stenosis and disequilibrium: He is here for clearance today. And he has had a 2D echocardiogram that showed his left  ventricular function is normal and 11/17/19 and also he is very active denies chest pain or shortness of breath advised to proceed with his low back surgery." One year follow-up planned.  Notation by Tonny Bollman, DO on 12/04/20 ([facility]), indicated that medical clearance letter was completed and faxed to surgeon.  Preoperative COVID-19 testing is scheduled for 12/21/20. Anesthesia team to evaluate on the day of surgery.   VS: BP 136/71   Pulse 63   Temp 36.6 C (Oral)   Resp 18   Ht 5\' 9"  (1.753 m)   Wt 82.6 kg   SpO2 96%   BMI 26.88 kg/m    PROVIDERS: , DO is PCP ([facility]) Tonny Bollman, MD is EP cardiologist ([facility]) Sandy Salaam, MD is vascular surgeon. Last visit 05/26/20 with 07/26/20, PA-C. One year follow-up planned.   Shawna Orleans, MD is neurologist ([facility])   LABS: Labs reviewed: Acceptable for surgery. (all labs ordered are listed, but only abnormal results are displayed)  Labs Reviewed  URINALYSIS, ROUTINE W REFLEX MICROSCOPIC - Abnormal; Notable for the following components:      Result Value   APPearance HAZY (*)    All other components within normal limits  SURGICAL PCR SCREEN  APTT  BASIC METABOLIC PANEL  CBC  PROTIME-INR    OTHER: Split Night Study 04/24/18: IMPRESSION:  1. 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 The supine AHI was 38.4 versus a non-supine AHI of 1.7/h.  2. Primary Snoring  3. Non-specific abnormal EKG    IMAGES: MRI L-spine 11/16/20: IMPRESSION: 1. L4-L5 grade 1 anterolisthesis, in part secondary to bilateral L4 pars defects, with  severe spinal canal stenosis and moderate to severe left neural foraminal narrowing. 2. Facet arthropathy throughout the lumbar spine.  MRI Abd 10/28/20: IMPRESSION: - 6.3 cm complex cystic lesion in upper pole of right kidney, consistent with a Bosniak category 85F lesion. Recommend continued follow-up by MRI in 6 months. This  recommendation follows ACR consensus guidelines: Management of the Incidental Renal Mass on CT:A White Paper of the ACR Incidental Findings Committee. J Am Coll Radiol 2018;15:264-273. -  Other benign-appearing bilateral renal cysts. No evidence of hydronephrosis. - Multiple small indeterminate splenic lesions, which are likely benign. Recommend continued attention on follow-up imaging.   MRI Brain 09/06/19 ([facility]): IMPRESSION:  1. No acute intracranial abnormality.  2. The cerebellum remains normal for age, but there are moderate  signal changes of small vessel disease elsewhere, stable to mildly  progressed since last year.  3. Degenerative synovial cyst suspected at the anterior C1-C2  articulation on the right, new since last year.     EKG: EKG 10/09/20 ([facility]): Per Narrative in Care Everywhere: Sinus rhythm  Rightward axis  Otherwise normal  Confirmed by Heide Scales [phone]) on 10/12/2020 1:57:13 PM   EKG 09/14/20: Sinus rhythm Borderline right axis deviation No acute changes Confirmed by Drema Pry [phone]) on 09/14/2020 7:08:37 AM   CV: [employer] implantable loop monitor placed 10/22/20 (model number of LINQ II and LNQ22 and serial number of R [id number]). Placed for recurrent falls without clear etiology.    Tilt Table Test 06/01/20 ([facility]): Impression:  Overall normal tilt table study but however he has some  component of orthostatic hypotension highest blood pressure systolic was  179 mmHG and lowest 144 mmHg.  But his heart rate is blunted, during the  whole procedure his lowest heart rate was 51 and highest 58 .  But however  he did not have any symptoms.  - Recommendations: Advise hydration and also get up from sitting and supine  positions slowly.    Carotid US 05/26/20: Summary:  - Right Carotid: Velocities in the right ICA are consistent with a 1-39% stenosis.  - Left Carotid: Patent left carotid endarterecomty with no evidence of restenosis.  -  Vertebrals: Bilateral vertebral arteries demonstrate antegrade flow.    Echo 11/15/19 ([facility]): SUMMARY  Normal left ventricular size, wall thickness, wall motion and systolic  function with ejection fraction 65-70%.  Left ventricular filling pattern is indeterminate.  The left atrium is mildly dilated.  There is no significant valvular stenosis or regurgitation.  No pulmonary hypertension.  There is no comparison study available.    Past Surgical History:  Procedure Laterality Date   CAROTID ENDARTERECTOMY  12/09/11   Left cea   ENDARTERECTOMY  12/09/2011   Procedure: ENDARTERECTOMY CAROTID;  Surgeon: Larina Earthly, MD;  Location: [facility] OR;  Service: Vascular;  Laterality: Left;   EYE SURGERY     rt retina detachment   HAMMER TOE SURGERY  2004   HERNIA REPAIR  2006   SHOULDER ARTHROSCOPY W/ ROTATOR CUFF REPAIR  2010   TONSILLECTOMY      MEDICATIONS:  acetaminophen (TYLENOL) 500 MG tablet   ALPRAZolam (XANAX) 0.5 MG tablet   Ascorbic Acid (VITAMIN C PO)   ferrous sulfate 325 (65 FE) MG EC tablet   flecainide (TAMBOCOR) 100 MG tablet   lidocaine (LIDODERM) 5 %   lisinopril (PRINIVIL,ZESTRIL) 10 MG tablet   methocarbamol (ROBAXIN) 500 MG tablet   Multiple Vitamins-Minerals (MULTIVITAMINS THER. W/MINERALS) TABS   rosuvastatin (CRESTOR) 10  MG tablet   sertraline (ZOLOFT) 100 MG tablet   silver sulfADIAZINE (SILVADENE) 1 % cream   traMADol (ULTRAM) 50 MG tablet   No current facility-administered medications for this encounter.    Shonna Chock, PA-C Surgical Short Stay/Anesthesiology [facility] Phone [phone] [facility] Phone [phone] 12/11/2020 7:20 PM

## 2020-12-11 NOTE — Anesthesia Preprocedure Evaluation (Addendum)
Anesthesia Evaluation  Patient identified by MRN, date of birth, ID band Patient awake    Reviewed: Allergy & Precautions, NPO status , Patient's Chart, lab work & pertinent test results  History of Anesthesia Complications Negative for: history of anesthetic complications  Airway Mallampati: II  TM Distance: >3 FB Neck ROM: Full    Dental no notable dental hx.    Pulmonary sleep apnea and Continuous Positive Airway Pressure Ventilation , COPD, former smoker,    Pulmonary exam normal        Cardiovascular hypertension, Pt. 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  Reproductive/Obstetrics negative OB ROS                           Anesthesia Physical Anesthesia Plan  ASA: 3  Anesthesia Plan: General   Post-op Pain Management:    Induction: Intravenous  PONV Risk Score and Plan: 2 and Treatment may vary due to age or medical condition, Ondansetron, Dexamethasone, Midazolam and Propofol infusion  Airway Management Planned: Oral ETT  Additional Equipment:   Intra-op Plan:   Post-operative Plan: Extubation in OR  Informed Consent: I have reviewed the patients History and Physical, chart, labs and discussed the procedure including the risks, benefits and alternatives for the proposed anesthesia with the patient or authorized representative who has  indicated his/her understanding and acceptance.     Dental advisory given  Plan Discussed with: CRNA  Anesthesia Plan Comments: (PAT note written 12/11/2020 by Shonna Chock, PA-C. )      Anesthesia Quick Evaluation

## 2020-12-11 NOTE — H&P (Deleted)
  The note originally documented on this encounter has been moved the the encounter in which it belongs.  

## 2020-12-21 ENCOUNTER — Other Ambulatory Visit: Payer: Self-pay

## 2020-12-21 ENCOUNTER — Other Ambulatory Visit (HOSPITAL_COMMUNITY)
Admission: RE | Admit: 2020-12-21 | Discharge: 2020-12-21 | Disposition: A | Discharge status: Home / Self Care | Payer: Medicare Other | Source: Ambulatory Visit | Attending: Orthopedic Surgery | Admitting: Orthopedic Surgery

## 2020-12-21 DIAGNOSIS — Z01812 Encounter for preprocedural laboratory examination: Secondary | ICD-10-CM | POA: Insufficient documentation

## 2020-12-21 DIAGNOSIS — Z20822 Contact with and (suspected) exposure to covid-19: Secondary | ICD-10-CM | POA: Insufficient documentation

## 2020-12-21 LAB — SARS CORONAVIRUS 2 (TAT 6-24 HRS): SARS Coronavirus 2: NEGATIVE

## 2020-12-24 ENCOUNTER — Inpatient Hospital Stay (HOSPITAL_COMMUNITY): Payer: Medicare Other

## 2020-12-24 ENCOUNTER — Inpatient Hospital Stay (HOSPITAL_COMMUNITY)
Admission: RE | Disposition: A | Discharge status: Home / Self Care | Payer: Self-pay | Source: Ambulatory Visit | Attending: Orthopedic Surgery

## 2020-12-24 ENCOUNTER — Other Ambulatory Visit: Payer: Self-pay

## 2020-12-24 ENCOUNTER — Encounter (HOSPITAL_COMMUNITY): Payer: Self-pay | Admitting: Orthopedic Surgery

## 2020-12-24 ENCOUNTER — Inpatient Hospital Stay (HOSPITAL_COMMUNITY)
Admission: RE | Admit: 2020-12-24 | Discharge: 2020-12-26 | DRG: 460 | Disposition: A | Discharge status: Home Health | Payer: Medicare Other | Source: Ambulatory Visit | Attending: Orthopedic Surgery | Admitting: Orthopedic Surgery

## 2020-12-24 ENCOUNTER — Inpatient Hospital Stay (HOSPITAL_COMMUNITY): Payer: Medicare Other | Admitting: Anesthesiology

## 2020-12-24 ENCOUNTER — Inpatient Hospital Stay (HOSPITAL_COMMUNITY): Payer: Medicare Other | Admitting: Vascular Surgery

## 2020-12-24 DIAGNOSIS — F419 Anxiety disorder, unspecified: Secondary | ICD-10-CM | POA: Diagnosis present

## 2020-12-24 DIAGNOSIS — J449 Chronic obstructive pulmonary disease, unspecified: Secondary | ICD-10-CM | POA: Diagnosis present

## 2020-12-24 DIAGNOSIS — Z20822 Contact with and (suspected) exposure to covid-19: Secondary | ICD-10-CM | POA: Diagnosis not present

## 2020-12-24 DIAGNOSIS — Z86010 Personal history of colonic polyps: Secondary | ICD-10-CM

## 2020-12-24 DIAGNOSIS — I739 Peripheral vascular disease, unspecified: Secondary | ICD-10-CM | POA: Diagnosis not present

## 2020-12-24 DIAGNOSIS — F32A Depression, unspecified: Secondary | ICD-10-CM | POA: Diagnosis not present

## 2020-12-24 DIAGNOSIS — Z981 Arthrodesis status: Secondary | ICD-10-CM

## 2020-12-24 DIAGNOSIS — R296 Repeated falls: Secondary | ICD-10-CM | POA: Diagnosis present

## 2020-12-24 DIAGNOSIS — M48061 Spinal stenosis, lumbar region without neurogenic claudication: Secondary | ICD-10-CM | POA: Diagnosis not present

## 2020-12-24 DIAGNOSIS — Z820 Family history of epilepsy and other diseases of the nervous system: Secondary | ICD-10-CM

## 2020-12-24 DIAGNOSIS — M4316 Spondylolisthesis, lumbar region: Principal | ICD-10-CM | POA: Diagnosis present

## 2020-12-24 DIAGNOSIS — I10 Essential (primary) hypertension: Secondary | ICD-10-CM | POA: Diagnosis present

## 2020-12-24 DIAGNOSIS — I480 Paroxysmal atrial fibrillation: Secondary | ICD-10-CM | POA: Diagnosis present

## 2020-12-24 DIAGNOSIS — G4733 Obstructive sleep apnea (adult) (pediatric): Secondary | ICD-10-CM | POA: Diagnosis not present

## 2020-12-24 DIAGNOSIS — Z79899 Other long term (current) drug therapy: Secondary | ICD-10-CM | POA: Diagnosis not present

## 2020-12-24 DIAGNOSIS — Z419 Encounter for procedure for purposes other than remedying health state, unspecified: Secondary | ICD-10-CM

## 2020-12-24 DIAGNOSIS — E785 Hyperlipidemia, unspecified: Secondary | ICD-10-CM | POA: Diagnosis present

## 2020-12-24 DIAGNOSIS — Z833 Family history of diabetes mellitus: Secondary | ICD-10-CM

## 2020-12-24 DIAGNOSIS — Z8673 Personal history of transient ischemic attack (TIA), and cerebral infarction without residual deficits: Secondary | ICD-10-CM | POA: Diagnosis not present

## 2020-12-24 DIAGNOSIS — Z87891 Personal history of nicotine dependence: Secondary | ICD-10-CM

## 2020-12-24 DIAGNOSIS — M5416 Radiculopathy, lumbar region: Secondary | ICD-10-CM | POA: Diagnosis present

## 2020-12-24 LAB — TYPE AND SCREEN
ABO/RH(D): A NEG
Antibody Screen: NEGATIVE

## 2020-12-24 SURGERY — POSTERIOR LUMBAR FUSION 1 LEVEL
Anesthesia: General | Site: Spine Lumbar

## 2020-12-24 MED ORDER — PROPOFOL 10 MG/ML IV BOLUS
INTRAVENOUS | Status: DC | PRN
  Administered 2020-12-24: 30 mg via INTRAVENOUS
  Administered 2020-12-24: 140 mg via INTRAVENOUS
  Administered 2020-12-24: 30 mg via INTRAVENOUS

## 2020-12-24 MED ORDER — OXYCODONE HCL 5 MG PO TABS
10.0000 mg | ORAL_TABLET | ORAL | Status: DC | PRN
  Administered 2020-12-24 – 2020-12-25 (×6): 10 mg via ORAL
  Filled 2020-12-24 (×6): qty 2

## 2020-12-24 MED ORDER — GABAPENTIN 300 MG PO CAPS
300.0000 mg | ORAL_CAPSULE | Freq: Three times a day (TID) | ORAL | Status: DC
  Administered 2020-12-24 – 2020-12-25 (×3): 300 mg via ORAL
  Filled 2020-12-24 (×3): qty 1

## 2020-12-24 MED ORDER — LACTATED RINGERS IV SOLN: INTRAVENOUS | Status: DC

## 2020-12-24 MED ORDER — ONDANSETRON HCL 4 MG/2ML IJ SOLN
INTRAMUSCULAR | Status: DC | PRN
  Administered 2020-12-24: 4 mg via INTRAVENOUS

## 2020-12-24 MED ORDER — ONDANSETRON HCL 4 MG PO TABS: 4.0000 mg | ORAL_TABLET | Freq: Four times a day (QID) | ORAL | Status: DC | PRN

## 2020-12-24 MED ORDER — SERTRALINE HCL 50 MG PO TABS
100.0000 mg | ORAL_TABLET | Freq: Every day | ORAL | Status: DC
  Administered 2020-12-25: 100 mg via ORAL
  Filled 2020-12-24: qty 2

## 2020-12-24 MED ORDER — TRANEXAMIC ACID-NACL 1000-0.7 MG/100ML-% IV SOLN
INTRAVENOUS | Status: DC | PRN
  Administered 2020-12-24: 1000 mg via INTRAVENOUS

## 2020-12-24 MED ORDER — PHENYLEPHRINE HCL-NACL 20-0.9 MG/250ML-% IV SOLN
INTRAVENOUS | Status: DC | PRN
  Administered 2020-12-24: 30 ug/min via INTRAVENOUS

## 2020-12-24 MED ORDER — FENTANYL CITRATE (PF) 250 MCG/5ML IJ SOLN
INTRAMUSCULAR | Status: DC | PRN
  Administered 2020-12-24: 100 ug via INTRAVENOUS
  Administered 2020-12-24: 50 ug via INTRAVENOUS

## 2020-12-24 MED ORDER — ONDANSETRON HCL 4 MG PO TABS: 4.0000 mg | ORAL_TABLET | Freq: Three times a day (TID) | ORAL | 0 refills | Status: DC | PRN

## 2020-12-24 MED ORDER — CHLORHEXIDINE GLUCONATE 0.12 % MT SOLN
OROMUCOSAL | Status: AC
  Administered 2020-12-24: 15 mL
  Filled 2020-12-24: qty 15

## 2020-12-24 MED ORDER — PROPOFOL 500 MG/50ML IV EMUL
INTRAVENOUS | Status: DC | PRN
Start: 2020-12-24 — End: 2020-12-24
  Administered 2020-12-24: 25 ug/kg/min via INTRAVENOUS
  Administered 2020-12-24: 35 ug/kg/min via INTRAVENOUS

## 2020-12-24 MED ORDER — FLEET ENEMA 7-19 GM/118ML RE ENEM: 1.0000 | ENEMA | Freq: Once | RECTAL | Status: DC | PRN

## 2020-12-24 MED ORDER — OXYCODONE HCL 5 MG/5ML PO SOLN: 5.0000 mg | Freq: Once | ORAL | Status: AC | PRN

## 2020-12-24 MED ORDER — THROMBIN 20000 UNITS EX SOLR
CUTANEOUS | Status: DC | PRN
  Administered 2020-12-24: 20 mL via TOPICAL

## 2020-12-24 MED ORDER — ROCURONIUM BROMIDE 10 MG/ML (PF) SYRINGE
PREFILLED_SYRINGE | INTRAVENOUS | Status: AC
  Filled 2020-12-24: qty 10

## 2020-12-24 MED ORDER — ONDANSETRON HCL 4 MG/2ML IJ SOLN
INTRAMUSCULAR | Status: AC
  Filled 2020-12-24: qty 2

## 2020-12-24 MED ORDER — ACETAMINOPHEN 500 MG PO TABS: 1000.0000 mg | ORAL_TABLET | Freq: Once | ORAL | Status: AC

## 2020-12-24 MED ORDER — TRANEXAMIC ACID-NACL 1000-0.7 MG/100ML-% IV SOLN
INTRAVENOUS | Status: AC
  Filled 2020-12-24: qty 100

## 2020-12-24 MED ORDER — FENTANYL CITRATE (PF) 100 MCG/2ML IJ SOLN
25.0000 ug | INTRAMUSCULAR | Status: DC | PRN
  Administered 2020-12-24 (×4): 25 ug via INTRAVENOUS

## 2020-12-24 MED ORDER — MORPHINE SULFATE (PF) 2 MG/ML IV SOLN: 2.0000 mg | INTRAVENOUS | Status: AC | PRN

## 2020-12-24 MED ORDER — OXYCODONE HCL 5 MG PO TABS
5.0000 mg | ORAL_TABLET | Freq: Once | ORAL | Status: AC | PRN
  Administered 2020-12-24: 5 mg via ORAL

## 2020-12-24 MED ORDER — DEXAMETHASONE SODIUM PHOSPHATE 10 MG/ML IJ SOLN
INTRAMUSCULAR | Status: AC
  Filled 2020-12-24: qty 1

## 2020-12-24 MED ORDER — FLECAINIDE ACETATE 100 MG PO TABS
100.0000 mg | ORAL_TABLET | Freq: Two times a day (BID) | ORAL | Status: DC
  Administered 2020-12-24 – 2020-12-25 (×3): 100 mg via ORAL
  Filled 2020-12-24 (×5): qty 1

## 2020-12-24 MED ORDER — METHOCARBAMOL 500 MG PO TABS: 500.0000 mg | ORAL_TABLET | Freq: Three times a day (TID) | ORAL | 0 refills | Status: AC | PRN

## 2020-12-24 MED ORDER — DOCUSATE SODIUM 100 MG PO CAPS
100.0000 mg | ORAL_CAPSULE | Freq: Two times a day (BID) | ORAL | Status: DC
  Administered 2020-12-24 – 2020-12-25 (×3): 100 mg via ORAL
  Filled 2020-12-24 (×3): qty 1

## 2020-12-24 MED ORDER — ONDANSETRON HCL 4 MG/2ML IJ SOLN: 4.0000 mg | Freq: Four times a day (QID) | INTRAMUSCULAR | Status: DC | PRN

## 2020-12-24 MED ORDER — SODIUM CHLORIDE 0.9% FLUSH: 3.0000 mL | Freq: Two times a day (BID) | INTRAVENOUS | Status: DC

## 2020-12-24 MED ORDER — LIDOCAINE HCL (PF) 2 % IJ SOLN
INTRAMUSCULAR | Status: AC
  Filled 2020-12-24: qty 5

## 2020-12-24 MED ORDER — ACETAMINOPHEN 325 MG PO TABS
650.0000 mg | ORAL_TABLET | ORAL | Status: DC | PRN
  Administered 2020-12-24 – 2020-12-26 (×4): 650 mg via ORAL
  Filled 2020-12-24 (×4): qty 2

## 2020-12-24 MED ORDER — ACETAMINOPHEN 500 MG PO TABS
ORAL_TABLET | ORAL | Status: AC
  Administered 2020-12-24: 1000 mg via ORAL
  Filled 2020-12-24: qty 2

## 2020-12-24 MED ORDER — VANCOMYCIN HCL 750 MG/150ML IV SOLN
750.0000 mg | Freq: Once | INTRAVENOUS | Status: AC
  Administered 2020-12-24: 750 mg via INTRAVENOUS
  Filled 2020-12-24: qty 150

## 2020-12-24 MED ORDER — BUPIVACAINE-EPINEPHRINE (PF) 0.25% -1:200000 IJ SOLN
INTRAMUSCULAR | Status: AC
  Filled 2020-12-24: qty 30

## 2020-12-24 MED ORDER — OXYCODONE HCL 5 MG PO TABS
5.0000 mg | ORAL_TABLET | ORAL | Status: DC | PRN
  Administered 2020-12-26: 5 mg via ORAL
  Filled 2020-12-24: qty 1

## 2020-12-24 MED ORDER — EPHEDRINE SULFATE-NACL 50-0.9 MG/10ML-% IV SOSY
PREFILLED_SYRINGE | INTRAVENOUS | Status: DC | PRN
  Administered 2020-12-24: 10 mg via INTRAVENOUS

## 2020-12-24 MED ORDER — OXYCODONE HCL 5 MG PO TABS
ORAL_TABLET | ORAL | Status: AC
  Filled 2020-12-24: qty 1

## 2020-12-24 MED ORDER — THROMBIN 20000 UNITS EX KIT
PACK | CUTANEOUS | Status: AC
  Filled 2020-12-24: qty 1

## 2020-12-24 MED ORDER — ALBUMIN HUMAN 5 % IV SOLN: INTRAVENOUS | Status: DC | PRN

## 2020-12-24 MED ORDER — ROCURONIUM BROMIDE 10 MG/ML (PF) SYRINGE
PREFILLED_SYRINGE | INTRAVENOUS | Status: DC | PRN
  Administered 2020-12-24: 20 mg via INTRAVENOUS

## 2020-12-24 MED ORDER — SODIUM CHLORIDE 0.9 % IV SOLN: 250.0000 mL | INTRAVENOUS | Status: DC

## 2020-12-24 MED ORDER — SUGAMMADEX SODIUM 200 MG/2ML IV SOLN
INTRAVENOUS | Status: DC | PRN
  Administered 2020-12-24: 100 mg via INTRAVENOUS

## 2020-12-24 MED ORDER — FENTANYL CITRATE (PF) 250 MCG/5ML IJ SOLN
INTRAMUSCULAR | Status: AC
  Filled 2020-12-24: qty 5

## 2020-12-24 MED ORDER — OXYCODONE-ACETAMINOPHEN 10-325 MG PO TABS: 1.0000 | ORAL_TABLET | Freq: Four times a day (QID) | ORAL | 0 refills | Status: AC | PRN

## 2020-12-24 MED ORDER — POLYETHYLENE GLYCOL 3350 17 G PO PACK: 17.0000 g | PACK | Freq: Every day | ORAL | Status: DC | PRN

## 2020-12-24 MED ORDER — CEFAZOLIN SODIUM-DEXTROSE 2-4 GM/100ML-% IV SOLN: 2.0000 g | INTRAVENOUS | Status: DC

## 2020-12-24 MED ORDER — PROMETHAZINE HCL 25 MG/ML IJ SOLN: 6.2500 mg | INTRAMUSCULAR | Status: DC | PRN

## 2020-12-24 MED ORDER — PHENOL 1.4 % MT LIQD: 1.0000 | OROMUCOSAL | Status: DC | PRN

## 2020-12-24 MED ORDER — CEFAZOLIN SODIUM-DEXTROSE 2-4 GM/100ML-% IV SOLN
INTRAVENOUS | Status: AC
  Filled 2020-12-24: qty 100

## 2020-12-24 MED ORDER — PROPOFOL 10 MG/ML IV BOLUS
INTRAVENOUS | Status: AC
  Filled 2020-12-24: qty 20

## 2020-12-24 MED ORDER — PROPOFOL 1000 MG/100ML IV EMUL
INTRAVENOUS | Status: AC
  Filled 2020-12-24: qty 100

## 2020-12-24 MED ORDER — SUCCINYLCHOLINE CHLORIDE 200 MG/10ML IV SOSY
PREFILLED_SYRINGE | INTRAVENOUS | Status: DC | PRN
  Administered 2020-12-24: 120 mg via INTRAVENOUS

## 2020-12-24 MED ORDER — 0.9 % SODIUM CHLORIDE (POUR BTL) OPTIME
TOPICAL | Status: DC | PRN
  Administered 2020-12-24: 1000 mL

## 2020-12-24 MED ORDER — METHOCARBAMOL 1000 MG/10ML IJ SOLN
500.0000 mg | Freq: Four times a day (QID) | INTRAVENOUS | Status: DC | PRN
  Filled 2020-12-24: qty 5

## 2020-12-24 MED ORDER — VANCOMYCIN HCL 1500 MG/300ML IV SOLN
1500.0000 mg | INTRAVENOUS | Status: AC
  Administered 2020-12-24: 1500 mg via INTRAVENOUS
  Filled 2020-12-24: qty 300

## 2020-12-24 MED ORDER — MENTHOL 3 MG MT LOZG: 1.0000 | LOZENGE | OROMUCOSAL | Status: DC | PRN

## 2020-12-24 MED ORDER — ACETAMINOPHEN 650 MG RE SUPP: 650.0000 mg | RECTAL | Status: DC | PRN

## 2020-12-24 MED ORDER — ROSUVASTATIN CALCIUM 5 MG PO TABS
10.0000 mg | ORAL_TABLET | Freq: Every day | ORAL | Status: DC
  Administered 2020-12-24 – 2020-12-25 (×2): 10 mg via ORAL
  Filled 2020-12-24 (×2): qty 2

## 2020-12-24 MED ORDER — FENTANYL CITRATE (PF) 100 MCG/2ML IJ SOLN
INTRAMUSCULAR | Status: AC
  Filled 2020-12-24: qty 2

## 2020-12-24 MED ORDER — DEXAMETHASONE SODIUM PHOSPHATE 10 MG/ML IJ SOLN
INTRAMUSCULAR | Status: DC | PRN
  Administered 2020-12-24: 10 mg via INTRAVENOUS

## 2020-12-24 MED ORDER — LIDOCAINE 2% (20 MG/ML) 5 ML SYRINGE
INTRAMUSCULAR | Status: DC | PRN
  Administered 2020-12-24: 100 mg via INTRAVENOUS

## 2020-12-24 MED ORDER — GLYCOPYRROLATE PF 0.2 MG/ML IJ SOSY
PREFILLED_SYRINGE | INTRAMUSCULAR | Status: DC | PRN
  Administered 2020-12-24: .2 mg via INTRAVENOUS

## 2020-12-24 MED ORDER — SODIUM CHLORIDE 0.9% FLUSH: 3.0000 mL | INTRAVENOUS | Status: DC | PRN

## 2020-12-24 MED ORDER — HEMOSTATIC AGENTS (NO CHARGE) OPTIME
TOPICAL | Status: DC | PRN
Start: 2020-12-24 — End: 2020-12-24
  Administered 2020-12-24: 1 via TOPICAL

## 2020-12-24 MED ORDER — METHOCARBAMOL 500 MG PO TABS
500.0000 mg | ORAL_TABLET | Freq: Four times a day (QID) | ORAL | Status: DC | PRN
  Administered 2020-12-24 – 2020-12-26 (×5): 500 mg via ORAL
  Filled 2020-12-24 (×5): qty 1

## 2020-12-24 MED ORDER — LISINOPRIL 10 MG PO TABS
10.0000 mg | ORAL_TABLET | Freq: Every evening | ORAL | Status: DC
  Administered 2020-12-24 – 2020-12-25 (×2): 10 mg via ORAL
  Filled 2020-12-24 (×2): qty 1

## 2020-12-24 MED ORDER — BUPIVACAINE-EPINEPHRINE 0.25% -1:200000 IJ SOLN
INTRAMUSCULAR | Status: DC | PRN
  Administered 2020-12-24: 10 mL

## 2020-12-24 SURGICAL SUPPLY — 70 items
BAG COUNTER SPONGE SURGICOUNT (BAG) ×2 IMPLANT
BLADE BN FN 3.2XSTRL LF (MISCELLANEOUS) ×1 IMPLANT
BLADE BONE MILL FINE (MISCELLANEOUS) ×2
BLADE CLIPPER SURG (BLADE) IMPLANT
BUR EGG ELITE 4.0 (BURR) ×2 IMPLANT
BUR MATCHSTICK NEURO 3.0 HP (BURR) ×2 IMPLANT
BUR MATCHSTICK NEURO 3.0 LAGG (BURR) ×2 IMPLANT
CABLE BIPOLOR RESECTION CORD (MISCELLANEOUS) ×2 IMPLANT
CANISTER SUCT 3000ML PPV (MISCELLANEOUS) ×2 IMPLANT
CAP RELINE MOD TULIP RMM (Cap) ×8 IMPLANT
CLIP NEUROVISION LG (CLIP) ×2 IMPLANT
CLSR STERI-STRIP ANTIMIC 1/2X4 (GAUZE/BANDAGES/DRESSINGS) IMPLANT
COVER SURGICAL LIGHT HANDLE (MISCELLANEOUS) ×2 IMPLANT
DRAIN CHANNEL 15F RND FF W/TCR (WOUND CARE) IMPLANT
DRAPE C-ARM 42X72 X-RAY (DRAPES) ×2 IMPLANT
DRAPE C-ARMOR (DRAPES) ×2 IMPLANT
DRAPE POUCH INSTRU U-SHP 10X18 (DRAPES) ×2 IMPLANT
DRAPE SURG 17X23 STRL (DRAPES) ×2 IMPLANT
DRAPE U-SHAPE 47X51 STRL (DRAPES) ×2 IMPLANT
DRSG OPSITE POSTOP 4X6 (GAUZE/BANDAGES/DRESSINGS) ×2 IMPLANT
DRSG OPSITE POSTOP 4X8 (GAUZE/BANDAGES/DRESSINGS) IMPLANT
DURAPREP 26ML APPLICATOR (WOUND CARE) ×2 IMPLANT
ELECT BLADE 4.0 EZ CLEAN MEGAD (MISCELLANEOUS) ×2
ELECT BLADE 6.5 EXT (BLADE) IMPLANT
ELECT PENCIL ROCKER SW 15FT (MISCELLANEOUS) ×2 IMPLANT
ELECT REM PT RETURN 9FT ADLT (ELECTROSURGICAL) ×2
ELECTRODE BLDE 4.0 EZ CLN MEGD (MISCELLANEOUS) ×1 IMPLANT
ELECTRODE REM PT RTRN 9FT ADLT (ELECTROSURGICAL) ×1 IMPLANT
GAUZE SPONGE 4X4 16PLY XRAY LF (GAUZE/BANDAGES/DRESSINGS) ×2 IMPLANT
GLOVE SURG MICRO LTX SZ8.5 (GLOVE) ×2 IMPLANT
GLOVE SURG UNDER POLY LF SZ8.5 (GLOVE) ×2 IMPLANT
GOWN STRL REUS W/ TWL LRG LVL3 (GOWN DISPOSABLE) ×1 IMPLANT
GOWN STRL REUS W/TWL 2XL LVL3 (GOWN DISPOSABLE) ×4 IMPLANT
GOWN STRL REUS W/TWL LRG LVL3 (GOWN DISPOSABLE) ×2
KIT BASIN OR (CUSTOM PROCEDURE TRAY) ×2 IMPLANT
KIT POSITION SURG JACKSON T1 (MISCELLANEOUS) IMPLANT
KIT TURNOVER KIT B (KITS) ×2 IMPLANT
MODULE EMG NEEDLE SSEP NVM5 (NEEDLE) ×2 IMPLANT
MODULE NVM5 NEXT GEN EMG (NEEDLE) ×2 IMPLANT
NEEDLE 22X1 1/2 (OR ONLY) (NEEDLE) ×2 IMPLANT
NEEDLE SPNL 18GX3.5 QUINCKE PK (NEEDLE) ×4 IMPLANT
NS IRRIG 1000ML POUR BTL (IV SOLUTION) ×2 IMPLANT
PACK LAMINECTOMY ORTHO (CUSTOM PROCEDURE TRAY) ×2 IMPLANT
PACK UNIVERSAL I (CUSTOM PROCEDURE TRAY) ×2 IMPLANT
PAD ARMBOARD 7.5X6 YLW CONV (MISCELLANEOUS) ×4 IMPLANT
PATTIES SURGICAL .5 X.5 (GAUZE/BANDAGES/DRESSINGS) ×4 IMPLANT
PATTIES SURGICAL .5 X1 (DISPOSABLE) ×2 IMPLANT
POSITIONER HEAD PRONE TRACH (MISCELLANEOUS) ×2 IMPLANT
PROBE BALL TIP NVM5 SNG USE (BALLOONS) ×2 IMPLANT
ROD RELINE 5.0X45MM (Rod) ×2 IMPLANT
ROD RELINE COCR LORD 5X40MM (Rod) ×2 IMPLANT
SCREW LOCK RSS 4.5/5.0MM (Screw) ×8 IMPLANT
SCREW SHANK RELINE MOD 5.5X35 (Screw) ×2 IMPLANT
SHANK RELINE MOD 5.5X40 (Screw) ×8 IMPLANT
SPONGE SURGIFOAM ABS GEL 100 (HEMOSTASIS) ×2 IMPLANT
SPONGE T-LAP 4X18 ~~LOC~~+RFID (SPONGE) ×8 IMPLANT
STRIP CLOSURE SKIN 1/2X4 (GAUZE/BANDAGES/DRESSINGS) ×2 IMPLANT
SURGIFLO W/THROMBIN 8M KIT (HEMOSTASIS) ×4 IMPLANT
SUT BONE WAX W31G (SUTURE) ×2 IMPLANT
SUT MNCRL AB 3-0 PS2 27 (SUTURE) ×2 IMPLANT
SUT VIC AB 1 CT1 18XCR BRD 8 (SUTURE) ×1 IMPLANT
SUT VIC AB 1 CT1 8-18 (SUTURE) ×2
SUT VIC AB 2-0 CT1 18 (SUTURE) ×2 IMPLANT
SYR BULB IRRIG 60ML STRL (SYRINGE) ×2 IMPLANT
SYR CONTROL 10ML LL (SYRINGE) ×2 IMPLANT
TOWEL GREEN STERILE (TOWEL DISPOSABLE) ×2 IMPLANT
TOWEL GREEN STERILE FF (TOWEL DISPOSABLE) ×2 IMPLANT
TRAY FOLEY MTR SLVR 16FR STAT (SET/KITS/TRAYS/PACK) ×2 IMPLANT
WATER STERILE IRR 1000ML POUR (IV SOLUTION) IMPLANT
YANKAUER SUCT BULB TIP NO VENT (SUCTIONS) ×2 IMPLANT

## 2020-12-24 NOTE — Brief Op Note (Signed)
12/24/2020  3:35 PM  PATIENT:  Brandon Mendoza  80 y.o. 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  Posterior lateral arthrodesis with local bone graft 3.  Posterior instrumented fusion L4-5 4.  Removal of hardware (Coflex device)   SURGEON:  Surgeon(s) and Role:    Venita Lick, MD - Primary  PHYSICIAN ASSISTANT:   ASSISTANTS: Voncille Lo, PA   ANESTHESIA:   general  EBL:  125 mL   BLOOD ADMINISTERED:none  DRAINS: none   LOCAL MEDICATIONS USED:  MARCAINE     SPECIMEN:  No Specimen  DISPOSITION OF SPECIMEN:  N/A  COUNTS:  YES  TOURNIQUET:  * No tourniquets in log *  DICTATION: .Dragon Dictation  PLAN OF CARE: Admit to inpatient   PATIENT DISPOSITION:  PACU - hemodynamically stable.

## 2020-12-24 NOTE — Discharge Instructions (Signed)

## 2020-12-24 NOTE — Transfer of Care (Signed)
Immediate Anesthesia Transfer of Care Note  Patient: Brandon Mendoza  Procedure(s) Performed: POSTERIOR DECOMPRESSION AND FUSION LUMBAR FOUR THROUGH FIVE,HARDWARE REMOVAL (Spine Lumbar)  Patient Location: PACU  Anesthesia Type:General  Level of Consciousness: drowsy  Airway & Oxygen Therapy: Patient Spontanous Breathing  Post-op Assessment: Report given to RN and Post -op Vital signs reviewed and stable  Post vital signs: Reviewed and stable  Last Vitals:  Vitals Value Taken Time  BP 115/50 12/24/20 1552  Temp    Pulse 64 12/24/20 1553  Resp 19 12/24/20 1553  SpO2 95 % 12/24/20 1553  Vitals shown include unvalidated device data.  Last Pain:  Vitals:   12/24/20 0939  TempSrc:   PainSc: 0-No pain         Complications: No notable events documented.

## 2020-12-24 NOTE — Anesthesia Procedure Notes (Signed)
Procedure Name: Intubation Date/Time: 12/24/2020 11:37 AM Performed by: Shary Decamp, CRNA Pre-anesthesia Checklist: Patient identified, Patient being monitored, Timeout performed, Emergency Drugs available and Suction available Patient Re-evaluated:Patient Re-evaluated prior to induction Oxygen Delivery Method: Circle System Utilized Preoxygenation: Pre-oxygenation with 100% oxygen Induction Type: IV induction Ventilation: Mask ventilation without difficulty Laryngoscope Size: Miller and 2 Grade View: Grade III Tube type: Oral Tube size: 8.0 mm Number of attempts: 1 Airway Equipment and Method: Stylet Placement Confirmation: ETT inserted through vocal cords under direct vision, positive ETCO2 and breath sounds checked- equal and bilateral Secured at: 22 cm Tube secured with: Tape Dental Injury: Teeth and Oropharynx as per pre-operative assessment  Difficulty Due To: Difficult Airway- due to limited oral opening

## 2020-12-24 NOTE — Progress Notes (Signed)
Pharmacy Antibiotic Note  Brandon Mendoza is a 80 y.o. male admitted on 12/24/2020 for removal of interspinous process device and formal decompression with need for surgical prophylaxis.  Pharmacy has been consulted for Vancomycin dosing. Vancomycin preoperative dose is 1500mg  IV at 0946AM. No drains.   Plan: Vancomycin 750mg  IV x1 at 2200 PM. Pharmacy will sign off as no drain in place.  Please re-consult if needed.   Height: 5\' 9"  (175.3 cm) Weight: 82.6 kg (182 lb) IBW/kg (Calculated) : 70.7  Temp (24hrs), Avg:97.7 F (36.5 C), Min:97 F (36.1 C), Max:98.5 F (36.9 C)  No results for input(s): WBC, CREATININE, LATICACIDVEN, VANCOTROUGH, VANCOPEAK, VANCORANDOM, GENTTROUGH, GENTPEAK, GENTRANDOM, TOBRATROUGH, TOBRAPEAK, TOBRARND, AMIKACINPEAK, AMIKACINTROU, AMIKACIN in the last 168 hours.  Estimated Creatinine Clearance: 49.1 mL/min (by C-G formula based on SCr of 1.18 mg/dL).    No Known Allergies  Antimicrobials this admission: Vancomycin for surgical prophylaxis  Dose adjustments this admission:   Microbiology results:   Thank you for allowing pharmacy to be a part of this patient's care.  , PharmD, BCPS, BCCCP Clinical Pharmacist Please refer to [facility] for [facility] Pharmacy numbers 12/24/2020 6:23 PM

## 2020-12-24 NOTE — Op Note (Signed)
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 Prior coflex insertion  POST-OPERATIVE DIAGNOSIS: Same  PROCEDURE:   1: Posterior Gill decompression with complete medial facetectomy and foraminotomy L4-5 2.  Posterior lateral arthrodesis with local bone graft 3.  Posterior instrumented fusion L4-5 4.  Removal of hardware (Coflex device)  SURGEON:  Venita Lick, MD  PHYSICIAN ASSISTANT: Voncille Lo, PA  ANESTHESIA:   General  EBL: 125 cc ml   Complications: None  Implants: NuVasive cortical pedicle screws.  5.5 x 40 mm length.  Graft: Local bone from decompression  Neuro monitoring: No abnormal free running EMG or SSEP activity throughout the case.  All 4 pedicle screws were directly stimulated.  Activity noted on free running EMGs ranging from 18 mA to 40 mA.  BRIEF HISTORY: Brandon Mendoza is a 80 y.o. male who had a prior interspinous process distraction device placed and did well initially.  Unfortunately he is continued to develop severe debilitating back buttock and radicular leg pain.  Imaging studies demonstrated significant lateral recess compromise due to ongoing spondylolisthesis.  He also had significant central stenosis.  As result of the failure to improve with the Coflex device we elected to move forward with surgery.  All appropriate risks, benefits, alternatives were discussed with the patient and consent was obtained  PROCEDURE DETAILS: Patient was brought into the operating room and was properly positioned on the operating room table.  After induction with general anesthesia the patient was endotracheally intubated.  A timeout was taken to confirm all important data: including patient, procedure, and the level. Teds, SCD's were applied.   A Foley was inserted, and  the appropriate monitoring devices were attached by the neuro monitoring representative.  The patient was then turned prone onto the Wilson frame.  All bony prominences were padded and the back was prepped and draped in standard fashion.  The prior posterior incision was marked and infiltrated with quarter percent Marcaine with epinephrine.  A longer incision was made spanning from the L4 pedicle to L5 pedicle.  Sharp dissection was carried out down to the deep fascia.  The deep fascia was incised and I stripped the paraspinal muscles to expose the L4 and L5 spinous process, lamina, and the 4 5 facet complex.  I could now visualize the inner spinous process distraction device.  I removed a portion of the L4 spinous process with a double-action Leksell rongeur which loosened device and allowed me to remove it.  At this point with the traction device removed I continued on with the surgery.  Using AP fluoroscopy identified the 7 o'clock position on the right L4 pedicle and the 5 o'clock position left L4 pedicle.  I then decorticated this and advanced the pedicle probe towards the contralateral side.  I confirmed trajectory and position with AP fluoroscopy.  As I passed the medial portion of the pedicle on the AP view I confirmed in the lateral that I was just beyond the midportion of the pedicle on the lateral view.  I then continued advanced until I was at the posterior wall the vertebral body.  I confirmed that my position and trajectory was correct in both planes.  I advanced into the vertebral body.  I remove the pedicle probe and palpated the hole with a ball-tipped feeler.  I confirmed there was a solid bony canal.  I then tapped and rechecked the hole to confirm that there was no breach.  The 5.5 x 40 mm length screw was then inserted and had excellent purchase.  I repeated this exact technique contralateral side as well as at the L5 level.  Once all 4 cortical pedicle screws were placed I directly stimulated  each of the screws.  The left L4 screw noted to be positive EMG activity at 18 mA, the L5 at 40 mA.  On the right side the L4 screw was positive at 25 mA and the L5 at 27 mA.  Satisfied with the addition of the pedicle screws in both the AP and lateral planes I proceeded with the decompression.  I distracted the space and then began my decompression.  Central laminotomy was performed with a 3 mm Kerrison rongeurs.  There was significant collapse of the facet complex especially on the right side.  Because there was more significant compression on the right side I elected to decompress the left first.  I continue to gently dissect through the ligamentum flavum until I could create a plane between the ligamentum flavum and the thecal sac.  I noted that the ligamentum flavum itself was significantly thickened and there was more compression of the thecal sac.  Using a 2 and 3 mm Kerrison rongeur I was able to complete my central decompression.  I noted that there was significant compression in the lateral recess and at the level of the takeoff of the L5 screw nerve root due to the medial facet bone spur.  I gently began dissecting to create a plane between the nerve root/thecal sac and the medial aspect of the facet complex.  I was able to then resect the medial portion of the facet until I could clearly visualize an adequate space in the lateral recess.  I continued to decompress out to the medial aspect of the pedicle and into the L5 foramen.  The leading edge of the L5 lamina was also decompressed.  At this point I had adequately decompressed the left side.  I confirmed with imaging that the area decompressed match the area of maximal stenosis on the MRI.    With the left side decompressed I proceeded to decompress the right.  I noted that the medial aspect of the facet complex was markedly compressing the L5 nerve root.  I extended the medial facetectomy in order to ultimately develop a plane and resect the  inferior L4 facet on this side.  Care was taken to gently decompress the leading edge of the L5 lamina and proceeded into the L5 foramen.  Ultimately though I felt as though there was significant decompression of this right L5 nerve root.  Once that medial facetectomy was completed the L5 nerve root began to expand and was no longer under significant compression.  I again used imaging to confirm that the area that I decompressed matched the area of maximum stenosis on the preoperative MRI on the right side.  At this point I was pleased with the overall decompression.  I can now pass the [facility] elevator superiorly inferiorly and out into the foramen bilaterally.  At this point I used a high-speed bur to decorticate the lateral aspect of the facet complex and the L4 transverse process.  I then packed the bone and then I harvested from the decompression into the posterior lateral gutter.  I then attached the polyaxial heads to the screws and then secured appropriate sized rod.  The locking caps were  inserted and tightened/torqued according to manufacture standards.  At this point the wound was copiously irrigated with normal saline and I confirmed that hemostasis with direct visualization.  The retractors were removed and the deep fascia was closed with interrupted #1 Vicryl suture.  Then a layer of 2-0 Vicryl suture interrupted, and finally 3-0 Monocryl for the subcutaneous closure.  Steri-Strips and a dry dressing were applied and the patient was ultimately extubated transfer the PACU without incident.  The end of the case all needle and sponge counts were correct.   Venita Lick, MD 12/24/2020 3:18 PM

## 2020-12-24 NOTE — H&P (Addendum)
Addendum H&P: There is been no change in the patient's clinical exam since his last office visit of 11/10/2020.  He continues to have severe back buttock and radicular leg pain due to the degenerative spondylolisthesis with spinal stenosis.  As a result of the failure to improve with conservative management we elected to move forward with surgery.  The plan will be an tentative lumbar decompression and foraminotomy to address the stenosis followed by pedicle screw fixation and fusion to prevent worsening of the spondylolisthesis.  All appropriate risks, benefits, alternatives to surgery were discussed with the patient and consent was obtained.

## 2020-12-25 MED ORDER — TAMSULOSIN HCL 0.4 MG PO CAPS
0.4000 mg | ORAL_CAPSULE | Freq: Every day | ORAL | Status: DC
  Administered 2020-12-25: 0.4 mg via ORAL
  Filled 2020-12-25: qty 1

## 2020-12-25 MED ORDER — SENNOSIDES-DOCUSATE SODIUM 8.6-50 MG PO TABS
1.0000 | ORAL_TABLET | Freq: Two times a day (BID) | ORAL | Status: DC
  Administered 2020-12-25: 1 via ORAL
  Filled 2020-12-25: qty 1

## 2020-12-25 MED ORDER — TAMSULOSIN HCL 0.4 MG PO CAPS
0.4000 mg | ORAL_CAPSULE | ORAL | Status: AC
  Administered 2020-12-25: 0.4 mg via ORAL
  Filled 2020-12-25: qty 1

## 2020-12-25 MED FILL — Thrombin For Soln Kit 20000 Unit: CUTANEOUS | Qty: 1 | Status: AC

## 2020-12-25 NOTE — Anesthesia Postprocedure Evaluation (Signed)
Anesthesia Post Note  Patient: Brandon Mendoza  Procedure(s) Performed: POSTERIOR DECOMPRESSION AND FUSION LUMBAR FOUR THROUGH FIVE,HARDWARE REMOVAL (Spine Lumbar)     Patient location during evaluation: PACU Anesthesia Type: General Level of consciousness: awake and alert and oriented Pain management: pain level controlled Vital Signs Assessment: post-procedure vital signs reviewed and stable Respiratory status: spontaneous breathing, nonlabored ventilation and respiratory function stable Cardiovascular status: blood pressure returned to baseline Postop Assessment: no apparent nausea or vomiting Anesthetic complications: no   No notable events documented.          Shanda Howells

## 2020-12-25 NOTE — Evaluation (Signed)
Physical Therapy Evaluation Patient Details Name: Brandon Mendoza MRN: [id number] DOB: 02/29/1941 Today's Date: 12/25/2020  History of Present Illness  Pt is an 80 y/o M admitted s/p L4-L5 PLIF on 12/24/2020. PMH includes COPD, HTN, PVD, CVA, HLD.  Clinical Impression  Pt admitted with above diagnosis. At the time of PT eval, pt was able to demonstrate transfers and ambulation with gross min guard assist to min assist and RW for support. Pt endorses fatigue and lightheadedness throughout session, however was able to ambulate ~400 feet without assistance. Pt was educated on precautions, brace application/wearing schedule, appropriate activity progression, and car transfer. Pt currently with functional limitations due to the deficits listed below (see PT Problem List). Pt will benefit from skilled PT to increase their independence and safety with mobility to allow discharge to the venue listed below.         Recommendations for follow up therapy are one component of a multi-disciplinary discharge planning process, led by the attending physician.  Recommendations may be updated based on patient status, additional functional criteria and insurance authorization.  Follow Up Recommendations No PT follow up;Supervision for mobility/OOB    Equipment Recommendations  None recommended by PT    Recommendations for Other Services       Precautions / Restrictions Precautions Precautions: Back;Fall Precaution Booklet Issued: Yes (comment) Precaution Comments: Reviewed handout and pt was cued for precautions during functional mobility. Required Braces or Orthoses: Spinal Brace Spinal Brace: Lumbar corset;Applied in sitting position Restrictions Weight Bearing Restrictions: No      Mobility  Bed Mobility Overal bed mobility: Needs Assistance Bed Mobility: Rolling;Sidelying to Sit Rolling: Modified independent (Device/Increase time) Sidelying to sit: Supervision Supine to sit: Supervision Sit to  supine: Supervision   General bed mobility comments: HOB flat and rails lowered to simulate home environment. No assist required however cues provided throughout for optimal technique.    Transfers Overall transfer level: Needs assistance Equipment used: Rolling walker (2 wheeled) Transfers: Sit to/from Stand Sit to Stand: Min assist         General transfer comment: Initially attempted to stand and plopped back down to the bed. On second attempt, min assist provided to achieve full stand. VC's for hand placement on seated surface for safety.  Ambulation/Gait Ambulation/Gait assistance: Min guard Gait Distance (Feet): 400 Feet Assistive device: Rolling walker (2 wheeled) Gait Pattern/deviations: Step-through pattern;Decreased stride length;Trunk flexed Gait velocity: Decreased Gait velocity interpretation: 1.31 - 2.62 ft/sec, indicative of limited community ambulator General Gait Details: VC's for improved posture, closer walker proximity, and forward gaze. No assist required however hands on guarding provided throughout for safety as pt reports feeling "woozy" and appeared unsteady at times.  Stairs            Wheelchair Mobility    Modified Rankin (Stroke Patients Only)       Balance Overall balance assessment: Needs assistance Sitting-balance support: No upper extremity supported Sitting balance-Leahy Scale: Fair     Standing balance support: Bilateral upper extremity supported Standing balance-Leahy Scale: Poor Standing balance comment: Reliant on UE support                             Pertinent Vitals/Pain Pain Assessment: Faces Pain Score: 6  Faces Pain Scale: Hurts little more Pain Location: back Pain Descriptors / Indicators: Aching;Discomfort;Sore Pain Intervention(s): Limited activity within patient's tolerance;Monitored during session;Repositioned    Home Living Family/patient expects to be discharged to:: Private residence  Living  Arrangements: Spouse/significant other Available Help at Discharge: Family;Available 24 hours/day Type of Home: House Home Access: Stairs to enter Entrance Stairs-Rails: Doctor, general practice of Steps: 4 Home Layout: Two level;Able to live on main level with bedroom/bathroom Home Equipment: Dan Humphreys - 2 wheels;Shower seat;Grab bars - tub/shower      Prior Function Level of Independence: Independent with assistive device(s)               Hand Dominance   Dominant Hand: Right    Extremity/Trunk Assessment   Upper Extremity Assessment Upper Extremity Assessment: Overall WFL for tasks assessed    Lower Extremity Assessment Lower Extremity Assessment: Generalized weakness (and muscular fatigue consistent with pre-op diagnosis and decreased tolerance for functional activity.)    Cervical / Trunk Assessment Cervical / Trunk Assessment: Other exceptions Cervical / Trunk Exceptions: s/p back surgery  Communication   Communication: No difficulties  Cognition Arousal/Alertness: Awake/alert Behavior During Therapy: Anxious Overall Cognitive Status: Within Functional Limits for tasks assessed                                        General Comments General comments (skin integrity, edema, etc.): VSS on RA    Exercises     Assessment/Plan    PT Assessment Patient needs continued PT services  PT Problem List Decreased strength;Decreased activity tolerance;Decreased balance;Decreased mobility;Decreased knowledge of use of DME;Decreased safety awareness;Decreased knowledge of precautions;Pain       PT Treatment Interventions DME instruction;Gait training;Functional mobility training;Stair training;Therapeutic activities;Therapeutic exercise;Neuromuscular re-education;Patient/family education    PT Goals (Current goals can be found in the Care Plan section)  Acute Rehab PT Goals Patient Stated Goal: Home tomorrow PT Goal Formulation: With  patient Time For Goal Achievement: 01/01/21 Potential to Achieve Goals: Good    Frequency Min 5X/week   Barriers to discharge        Co-evaluation               AM-PAC PT "6 Clicks" Mobility  Outcome Measure Help needed turning from your back to your side while in a flat bed without using bedrails?: None Help needed moving from lying on your back to sitting on the side of a flat bed without using bedrails?: A Little Help needed moving to and from a bed to a chair (including a wheelchair)?: A Little Help needed standing up from a chair using your arms (e.g., wheelchair or bedside chair)?: A Little Help needed to walk in hospital room?: A Little Help needed climbing 3-5 steps with a railing? : A Little 6 Click Score: 19    End of Session Equipment Utilized During Treatment: Gait belt Activity Tolerance: Patient tolerated treatment well Patient left: in bed;with call bell/phone within reach Nurse Communication: Mobility status PT Visit Diagnosis: Unsteadiness on feet (R26.81);Pain Pain - part of body:  (back)    Time: 9629-5284 PT Time Calculation (min) (ACUTE ONLY): 23 min   Charges:   PT Evaluation $PT Eval Low Complexity: 1 Low PT Treatments $Gait Training: 8-22 mins        Conni Slipper, PT, DPT Acute Rehabilitation Services Pager: [phone] Office: [phone]   Marylynn Pearson 12/25/2020, 2:44 PM

## 2020-12-25 NOTE — Progress Notes (Signed)
PT Cancellation Note  Patient Details Name: Brandon Mendoza MRN: [id number] DOB: 1940/12/28   Cancelled Treatment:    Reason Eval/Treat Not Completed: Fatigue/lethargy limiting ability to participate. Pt asking that PT return another time. Reports he is "very tired and can't get any rest".   Marylynn Pearson 12/25/2020, 11:48 AM  Conni Slipper, PT, DPT Acute Rehabilitation Services Pager: [phone] Office: [phone]

## 2020-12-25 NOTE — Plan of Care (Signed)
WNL

## 2020-12-25 NOTE — Progress Notes (Addendum)
Subjective: 1 Day Post-Op Procedure(s) (LRB): POSTERIOR DECOMPRESSION AND FUSION LUMBAR FOUR THROUGH FIVE,HARDWARE REMOVAL (N/A) Patient reports pain as mild.  Incisional back pain only. Leg pain improved. Tolerating PO without N/V Required an in and out cath last night. States he has attempted to void on his own with dribbles only. Started on flomax. -Flatus -BM +ambulation Denies CP, SOB, calf pain, sweats/chills  Objective: Vital signs in last 24 hours: Temp:  [97 F (36.1 C)-98.5 F (36.9 C)] 98.4 F (36.9 C) (09/30 0327) Pulse Rate:  [58-78] 78 (09/30 0327) Resp:  [14-25] 18 (09/30 0327) BP: (101-166)/(50-89) 136/73 (09/30 0327) SpO2:  [92 %-96 %] 94 % (09/30 0327) Weight:  [82.6 kg] 82.6 kg (09/29 0932)  Intake/Output from previous day: 09/29 0701 - 09/30 0700 In: 1550 [I.V.:1200; IV Piggyback:350] Out: 2225 [Urine:2100; Blood:125] Intake/Output this shift: No intake/output data recorded.  No results for input(s): HGB in the last 72 hours. No results for input(s): WBC, RBC, HCT, PLT in the last 72 hours. No results for input(s): NA, K, CL, CO2, BUN, CREATININE, GLUCOSE, CALCIUM in the last 72 hours. No results for input(s): LABPT, INR in the last 72 hours.  Neurologically intact ABD soft Neurovascular intact Sensation intact distally Intact pulses distally Dorsiflexion/Plantar flexion intact Incision: no drainage Compartment soft Under the supervision of a chaperone I performed peri anal sensation and a digital rectal exam to assess for rectal tone. Both WNL.   Assessment/Plan: 1 Day Post-Op Procedure(s) (LRB): POSTERIOR DECOMPRESSION AND FUSION LUMBAR FOUR THROUGH FIVE,HARDWARE REMOVAL (N/A)  Suspect post-op urinary retention. Flomax started. We will see if he is able to void today on his own. We can do a post void bladder scan to ensure that he is emptying his bladder completely. If he is unable to void and empty completely, then we will plan to place a foley  and he may have to follow-up with urology as an outpatient.   Up with Therapy. OOB with LSO brace.  Pain well controlled with current meds.   DVT Ppx: Teds, SCDs, ambulation. Plan to restart pradaxa tomorrow.   Likely D/C tomorrow. Will wait until he has +flatus or BM, is able to void (or has foley placed).  Patient is established with urologist in [place].  AVS and scripts in chart.   Rhodia Albright 12/25/2020, 7:26 AM

## 2020-12-25 NOTE — Evaluation (Signed)
Occupational Therapy Evaluation Patient Details Name: Brandon Mendoza MRN: [id number] DOB: 12/18/1940 Today's Date: 12/25/2020   History of Present Illness Pt is 80 y/o M admitted on 12/24/20 s/p removal of interspinous process device and formal decompression. PMH includes COPD, HTN, PVD, CVA, HLD.   Clinical Impression   Pt presents with decreased balance and mobility, using RW for ambulation. Pt primarily independent to modified independent with ADLs, however would likely benefit from supervision for safety with functional transfers and functional mobility at this time. Pt reports that his wife will be available to provide 24 hour assistance as needed at home. Pt demonstrated ability to correctly and independently don back brace in seated position at EOB. Pt educated on spinal precautions and compensatory techniques for ADLs. Pt demonstrated good safety awareness and maintenance of precautions during functional activity. Pt should be safe to return home once cleared medically. No further skilled OT needs at this time. Will sign off.     Recommendations for follow up therapy are one component of a multi-disciplinary discharge planning process, led by the attending physician.  Recommendations may be updated based on patient status, additional functional criteria and insurance authorization.   Follow Up Recommendations  No OT follow up    Equipment Recommendations  None recommended by OT    Recommendations for Other Services       Precautions / Restrictions Precautions Precautions: Back;Fall Precaution Booklet Issued: Yes (comment) Required Braces or Orthoses: Spinal Brace Restrictions Weight Bearing Restrictions: No      Mobility Bed Mobility Overal bed mobility: Needs Assistance Bed Mobility: Rolling;Supine to Sit;Sit to Supine Rolling: Supervision   Supine to sit: Supervision Sit to supine: Supervision        Transfers                      Balance Overall  balance assessment: Needs assistance Sitting-balance support: No upper extremity supported Sitting balance-Leahy Scale: Good     Standing balance support: Bilateral upper extremity supported Standing balance-Leahy Scale: Fair                             ADL either performed or assessed with clinical judgement   ADL Overall ADL's : Needs assistance/impaired Eating/Feeding: Independent   Grooming: Independent   Upper Body Bathing: Modified independent   Lower Body Bathing: Modified independent   Upper Body Dressing : Independent   Lower Body Dressing: Independent   Toilet Transfer: Supervision/safety   Toileting- Clothing Manipulation and Hygiene: Independent   Tub/ Shower Transfer: Supervision/safety   Functional mobility during ADLs: Supervision/safety General ADL Comments: Supervision for safety during functional transfers and functional mobility     Vision   Vision Assessment?: No apparent visual deficits     Perception     Praxis      Pertinent Vitals/Pain Pain Assessment: 0-10 Pain Score: 6  Pain Location: back Pain Descriptors / Indicators: Aching;Discomfort;Sore Pain Intervention(s): Monitored during session     Hand Dominance     Extremity/Trunk Assessment Upper Extremity Assessment Upper Extremity Assessment: Overall WFL for tasks assessed   Lower Extremity Assessment Lower Extremity Assessment: Overall WFL for tasks assessed   Cervical / Trunk Assessment Cervical / Trunk Assessment: Other exceptions Cervical / Trunk Exceptions: s/p back surgery   Communication Communication Communication: No difficulties   Cognition Arousal/Alertness: Awake/alert Behavior During Therapy: WFL for tasks assessed/performed Overall Cognitive Status: Within Functional Limits for tasks assessed  General Comments  VSS on RA    Exercises     Shoulder Instructions      Home Living  Family/patient expects to be discharged to:: Private residence Living Arrangements: Spouse/significant other Available Help at Discharge: Family;Available 24 hours/day Type of Home: House Home Access: Stairs to enter [employer] of Steps: 4 Entrance Stairs-Rails: Right;Left Home Layout: Two level;Able to live on main level with bedroom/bathroom     Bathroom Shower/Tub: Walk-in shower   Bathroom Toilet: Handicapped height     Home Equipment: Environmental consultant - 2 wheels;Shower seat;Grab bars - tub/shower          Prior Functioning/Environment Level of Independence: Independent with assistive device(s)                 OT Problem List: Impaired balance (sitting and/or standing)      OT Treatment/Interventions:      OT Goals(Current goals can be found in the care plan section) Acute Rehab OT Goals Patient Stated Goal: return home OT Goal Formulation: With patient  OT Frequency:     Barriers to D/C:            Co-evaluation              AM-PAC OT "6 Clicks" Daily Activity     Outcome Measure Help from another person eating meals?: None Help from another person taking care of personal grooming?: None Help from another person toileting, which includes using toliet, bedpan, or urinal?: None Help from another person bathing (including washing, rinsing, drying)?: None Help from another person to put on and taking off regular upper body clothing?: None Help from another person to put on and taking off regular lower body clothing?: None 6 Click Score: 24   End of Session Equipment Utilized During Treatment: Rolling walker;Back brace Nurse Communication: Mobility status  Activity Tolerance: Patient tolerated treatment well Patient left: in bed;with call bell/phone within reach  OT Visit Diagnosis: Unsteadiness on feet (R26.81)                Time: 7989-2119 OT Time Calculation (min): 25 min Charges:  OT General Charges $OT Visit: 1 Visit OT Evaluation $OT  Eval Low Complexity: 1 Low OT Treatments $Self Care/Home Management : 8-22 mins  Roben Tatsch C, OT/L  Acute Rehab [phone]   Lenice Llamas 12/25/2020, 11:27 AM

## 2020-12-26 NOTE — Progress Notes (Signed)
Patient alert and oriented, mae's well, voiding adequate amount of urine, swallowing without difficulty, no c/o pain at time of discharge. Patient discharged home with family. Script and discharged instructions given to patient. Patient and family stated understanding of instructions given. Patient has an appointment with Dr. Brooks  

## 2020-12-26 NOTE — Progress Notes (Signed)
   Subjective: 2 Days Post-Op Procedure(s) (LRB): POSTERIOR DECOMPRESSION AND FUSION LUMBAR FOUR THROUGH FIVE,HARDWARE REMOVAL (N/A) Patient reports pain as mild.   Patient is well, and has had no acute complaints or problems Plan is to go Home after hospital stay. +Flatus Urinating on his own.  +Ambulation Denies SOB, chest pain, or calf pain. No acute overnight events. Ambulated ~400 feet with therapy yesterday. Will continue therapy today.    Objective: Vital signs in last 24 hours: Temp:  [98.3 F (36.8 C)-99.6 F (37.6 C)] 99.6 F (37.6 C) (10/01 0753) Pulse Rate:  [71-87] 87 (10/01 0753) Resp:  [18] 18 (10/01 0753) BP: (115-143)/(48-89) 143/89 (10/01 0753) SpO2:  [89 %-96 %] 96 % (10/01 0753)  Intake/Output from previous day:  Intake/Output Summary (Last 24 hours) at 12/26/2020 0827 Last data filed at 12/26/2020 0517 Gross per 24 hour  Intake 480 ml  Output 1300 ml  Net -820 ml    Intake/Output this shift: No intake/output data recorded.  Labs: No results for input(s): HGB in the last 72 hours. No results for input(s): WBC, RBC, HCT, PLT in the last 72 hours. No results for input(s): NA, K, CL, CO2, BUN, CREATININE, GLUCOSE, CALCIUM in the last 72 hours. No results for input(s): LABPT, INR in the last 72 hours.  Exam: General - Patient is Alert and Oriented Extremity - Neurologically intact Neurovascular intact Intact pulses distally Dorsiflexion/Plantar flexion intact Dressing/Incision - no drainage Motor Function - intact, moving foot and toes well on exam.   Past Medical History:  Diagnosis Date   Anxiety    Carotid artery occlusion    COPD (chronic obstructive pulmonary disease) (HCC)    Depression    Dysrhythmia    PAF, atrial tachycardia   Hx of colonic polyps    Hyperlipidemia    Hypertension    Nocturia    Peripheral vascular disease (HCC)    Sleep apnea    on 6 cm, nasal pillow   Stroke (HCC) 05/01/2011   ischemic     Assessment/Plan: 2 Days Post-Op Procedure(s) (LRB): POSTERIOR DECOMPRESSION AND FUSION LUMBAR FOUR THROUGH FIVE,HARDWARE REMOVAL (N/A) Active Problems:   S/P lumbar fusion  Estimated body mass index is 26.88 kg/m as calculated from the following:   Height as of this encounter: 5\' 9"  (1.753 m).   Weight as of this encounter: 82.6 kg. Up with therapy  DVT Prophylaxis -  Pradaxa Weight-bearing as tolerated.  OOB with LSO brace.   Patient feeling well this morning. Is able to urinate on his own. Has not had a BM yet, but reports +flatus.   Continue therapy this am.   Pain well controlled. Plan for discharge today.   Patient to follow up with Urologist.  AVS and scripts in chart.   , MBA, PA-C Orthopedic Surgery 12/26/2020, 8:27 AM

## 2020-12-26 NOTE — TOC Transition Note (Signed)
Transition of Care [facility]) - CM/SW Discharge Note   Patient Details  Name: JERALD HENNINGTON MRN: [id number] Date of Birth: Nov 14, 1940  Transition of [facility]) CM/SW Contact:  Lawerance Sabal, RN Phone Number: 12/26/2020, 9:47 AM   Clinical Narrative:    Sherron Monday w patient at bedside. He does not have preference of HH provider, agrees with need for [facility] as ordered. Referral accepted by [facility]. Unit to supply DME if needed    Final next level of care: Home w Home Health Services Barriers to Discharge: No Barriers Identified   Patient Goals and CMS Choice Patient states their goals for this hospitalization and ongoing recovery are:: to go home CMS Medicare.gov Compare Post Acute Care list provided to:: Patient Choice offered to / list presented to : Patient  Discharge Placement                       Discharge Plan and Services                          HH Arranged: PT [facility] Agency: [facility] Health Care Date [facility] Agency Contacted: 12/26/20 Time HH Agency Contacted: [phone] Representative spoke with at [facility] Agency: Kandee Keen  Social Determinants of Health (SDOH) Interventions     Readmission Risk Interventions No flowsheet data found.

## 2020-12-26 NOTE — Progress Notes (Signed)
Physical Therapy Treatment Patient Details Name: Brandon Mendoza MRN: [id number] DOB: Aug 02, 1940 Today's Date: 12/26/2020   History of Present Illness Pt is an 80 y/o M admitted s/p L4-L5 PLIF on 12/24/2020. PMH includes COPD, HTN, PVD, CVA, HLD.    PT Comments    Pt progressing well with post-op mobility. He was able to demonstrate transfers and ambulation with up to mod assist and RW for support. Pt required increased assist for stair training. He was not making corrective changes with therapist's cues and safety was a concern. Reinforced education on safety with stair training after, and issued a gait belt for family to utilize for entrance into the home. Also reinforced education on precautions, brace application/wearing schedule, appropriate activity progression, and car transfer. Will continue to follow.      Recommendations for follow up therapy are one component of a multi-disciplinary discharge planning process, led by the attending physician.  Recommendations may be updated based on patient status, additional functional criteria and insurance authorization.  Follow Up Recommendations  Home health PT;Supervision for mobility/OOB     Equipment Recommendations  None recommended by PT    Recommendations for Other Services       Precautions / Restrictions Precautions Precautions: Back;Fall Precaution Booklet Issued: Yes (comment) Precaution Comments: Reviewed handout and pt was cued for precautions during functional mobility. Required Braces or Orthoses: Spinal Brace Spinal Brace: Lumbar corset;Applied in sitting position Restrictions Weight Bearing Restrictions: No     Mobility  Bed Mobility Overal bed mobility: Needs Assistance Bed Mobility: Rolling;Sit to Sidelying Rolling: Modified independent (Device/Increase time)       Sit to sidelying: Supervision General bed mobility comments: HOB flat and min use of rails. Pt required frequent cues and hand-over-hand assist for  proper log roll technique.    Transfers Overall transfer level: Needs assistance Equipment used: Rolling walker (2 wheeled) Transfers: Sit to/from Stand Sit to Stand: Min assist;Min guard         General transfer comment: Initial attempt, required min assist to power-up to full stand. In second attempt, assist for walker stabilization as pt heavily pulling up to stand. VC's for hand placement on seated surface however not making corrective changes.  Ambulation/Gait Ambulation/Gait assistance: Min assist Gait Distance (Feet): 250 Feet Assistive device: Rolling walker (2 wheeled) Gait Pattern/deviations: Step-through pattern;Decreased stride length;Trunk flexed Gait velocity: Decreased Gait velocity interpretation: <1.31 ft/sec, indicative of household ambulator General Gait Details: VC's for improved posture, closer walker proximity, and forward gaze. Frequent assist required for balance and to correct posterior lean.   Stairs Stairs: Yes Stairs assistance: Mod assist Stair Management: Two rails;Step to pattern;Forwards Number of Stairs: 10 General stair comments: Cued pt only to attempt the 4 stairs he has to enter his home, however pt continued on and insisted on doing the full flight despite therapist telling him to stop. Knees buckling by the time pt reached the top. Heavy mod assist required for balance and to correct posterior lean and knee buckling when descending. Cued for sideways negotiation at home if he cannot reach both rails.   Wheelchair Mobility    Modified Rankin (Stroke Patients Only)       Balance Overall balance assessment: Needs assistance Sitting-balance support: No upper extremity supported Sitting balance-Leahy Scale: Fair     Standing balance support: Bilateral upper extremity supported Standing balance-Leahy Scale: Poor Standing balance comment: Reliant on UE support  Cognition Arousal/Alertness:  Awake/alert Behavior During Therapy: Anxious Overall Cognitive Status: Within Functional Limits for tasks assessed                                        Exercises      General Comments        Pertinent Vitals/Pain Pain Assessment: Faces Faces Pain Scale: Hurts little more Pain Location: back Pain Descriptors / Indicators: Aching;Discomfort;Sore Pain Intervention(s): Limited activity within patient's tolerance;Monitored during session;Repositioned    Home Living Family/patient expects to be discharged to:: Private residence Living Arrangements: Spouse/significant other Available Help at Discharge: Family;Available 24 hours/day Type of Home: House Home Access: Stairs to enter Entrance Stairs-Rails: Right;Left Home Layout: Two level;Able to live on main level with bedroom/bathroom Home Equipment: Dan Humphreys - 2 wheels;Shower seat;Grab bars - tub/shower      Prior Function Level of Independence: Independent with assistive device(s)          PT Goals (current goals can now be found in the care plan section) Acute Rehab PT Goals Patient Stated Goal: Home tomorrow PT Goal Formulation: With patient Time For Goal Achievement: 01/01/21 Potential to Achieve Goals: Good Progress towards PT goals: Progressing toward goals    Frequency    Min 5X/week      PT Plan Discharge plan needs to be updated    Co-evaluation              AM-PAC PT "6 Clicks" Mobility   Outcome Measure  Help needed turning from your back to your side while in a flat bed without using bedrails?: None Help needed moving from lying on your back to sitting on the side of a flat bed without using bedrails?: A Little Help needed moving to and from a bed to a chair (including a wheelchair)?: A Little Help needed standing up from a chair using your arms (e.g., wheelchair or bedside chair)?: A Little Help needed to walk in hospital room?: A Little Help needed climbing 3-5 steps with a  railing? : A Lot 6 Click Score: 18    End of Session Equipment Utilized During Treatment: Gait belt Activity Tolerance: Patient tolerated treatment well Patient left: in bed;with call bell/phone within reach Nurse Communication: Mobility status PT Visit Diagnosis: Unsteadiness on feet (R26.81);Pain Pain - part of body:  (back)     Time: 0727-0752 PT Time Calculation (min) (ACUTE ONLY): 25 min  Charges:  $Gait Training: 23-37 mins                     Conni Slipper, PT, DPT Acute Rehabilitation Services Pager: [phone] Office: [phone]    Marylynn Pearson 12/26/2020, 8:02 AM

## 2021-01-01 NOTE — Discharge Summary (Signed)
Patient ID: Brandon Mendoza MRN: [id number] DOB/AGE: 1941/03/04 80 y.o.  Admit date: 12/24/2020 Discharge date: 12/26/2020  Admission Diagnoses:  Active Problems:   S/P lumbar fusion   Discharge Diagnoses:  Active Problems:   S/P lumbar fusion  status post Procedure(s): POSTERIOR DECOMPRESSION AND FUSION LUMBAR FOUR THROUGH FIVE,HARDWARE REMOVAL  Past Medical History:  Diagnosis Date   Anxiety    Carotid artery occlusion    COPD (chronic obstructive pulmonary disease) (HCC)    Depression    Dysrhythmia    PAF, atrial tachycardia   Hx of colonic polyps    Hyperlipidemia    Hypertension    Nocturia    Peripheral vascular disease (HCC)    Sleep apnea    on 6 cm, nasal pillow   Stroke (HCC) 05/01/2011   ischemic    Surgeries: Procedure(s): POSTERIOR DECOMPRESSION AND FUSION LUMBAR FOUR THROUGH FIVE,HARDWARE REMOVAL on 12/24/2020   Consultants:   Discharged Condition: Improved  Hospital Course: Brandon Mendoza is an 80 y.o. 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 Patient failed conservative treatments (please see the history and physical for the specifics) and had severe unremitting pain that affects sleep, daily activities and work/hobbies. After pre-op clearance, the patient was taken to the operating room on 12/24/2020 and underwent  Procedure(s): POSTERIOR DECOMPRESSION AND FUSION LUMBAR FOUR THROUGH FIVE,HARDWARE REMOVAL.    Patient was given perioperative antibiotics:  Anti-infectives (From admission, onward)    Start     Dose/Rate Route Frequency Ordered Stop   12/24/20 2200  vancomycin (VANCOREADY) IVPB 750 mg/150 mL        750 mg 150 mL/hr over 60 Minutes Intravenous  Once 12/24/20 1837 12/24/20 2129   12/24/20 0929  ceFAZolin (ANCEF) 2-4 GM/100ML-% IVPB       Note to Pharmacy: Wilburn Mylar   : cabinet override      12/24/20 0929 12/24/20 2144   12/24/20 0926  ceFAZolin (ANCEF) IVPB 2g/100 mL  premix  Status:  Discontinued        2 g 200 mL/hr over 30 Minutes Intravenous 30 min pre-op 12/24/20 0926 12/24/20 1707   12/24/20 0926  vancomycin (VANCOREADY) IVPB 1500 mg/300 mL        1,500 mg 150 mL/hr over 120 Minutes Intravenous 120 min pre-op 12/24/20 0926 12/24/20 1146        Patient was given sequential compression devices and early ambulation to prevent DVT.   Patient benefited maximally from hospital stay and there were no complications. At the time of discharge, the patient was urinating/moving their bowels without difficulty, tolerating a regular diet, pain is controlled with oral pain medications and they have been cleared by PT/OT.   Recent vital signs: No data found.   Recent laboratory studies: No results for input(s): WBC, HGB, HCT, PLT, NA, K, CL, CO2, BUN, CREATININE, GLUCOSE, INR, CALCIUM in the last 72 hours.  Invalid input(s): PT, 2   Discharge Medications:   Allergies as of 12/26/2020   No Known Allergies      Medication List     STOP taking these medications    acetaminophen 500 MG tablet Commonly known as: TYLENOL   ferrous sulfate 325 (65 FE) MG EC tablet   lidocaine 5 % Commonly known as: Lidoderm   methocarbamol 500 MG tablet Commonly known as: ROBAXIN   multivitamins ther. w/minerals Tabs tablet   silver sulfADIAZINE 1 % cream Commonly known as: SILVADENE   traMADol 50 MG tablet  Commonly known as: Ultram       TAKE these medications    ALPRAZolam 0.5 MG tablet Commonly known as: XANAX Take 0.25 mg by mouth at bedtime.   flecainide 100 MG tablet Commonly known as: TAMBOCOR Take 100 mg by mouth 2 (two) times daily.   lisinopril 10 MG tablet Commonly known as: ZESTRIL Take 10 mg by mouth every evening.   ondansetron 4 MG tablet Commonly known as: Zofran Take 1 tablet (4 mg total) by mouth every 8 (eight) hours as needed for nausea or vomiting.   rosuvastatin 10 MG tablet Commonly known as: CRESTOR Take 10 mg by  mouth at bedtime.   sertraline 100 MG tablet Commonly known as: ZOLOFT Take 1 tablet (100 mg total) by mouth daily.   VITAMIN C PO Take 2 tablets by mouth in the morning.       ASK your doctor about these medications    methocarbamol 500 MG tablet Commonly known as: Robaxin Take 1 tablet (500 mg total) by mouth every 8 (eight) hours as needed for up to 5 days for muscle spasms. Ask about: Should I take this medication?   oxyCODONE-acetaminophen 10-325 MG tablet Commonly known as: Percocet Take 1 tablet by mouth every 6 (six) hours as needed for up to 5 days for pain. Ask about: Should I take this medication?        Diagnostic Studies: DG Lumbar Spine 2-3 Views  Result Date: 12/24/2020 CLINICAL DATA:  Posterior decompression and fusion of the lumbar spine. EXAM: LUMBAR SPINE - 2-3 VIEW COMPARISON:  MRI lumbar spine 11/16/2020 FINDINGS: Two images obtained via portable C-arm radiography obtained in the operating room show interval placement of bilateral pedicle screws and posterior rods at L4-5. Alignment appears anatomic. IMPRESSION: Status post L4-5 posterior fusion. Electronically Signed   By: Signa Kell M.D.   On: 12/24/2020 17:34   DG C-Arm 1-60 Min-No Report  Result Date: 12/24/2020 Fluoroscopy was utilized by the requesting physician.  No radiographic interpretation.   DG C-Arm 1-60 Min-No Report  Result Date: 12/24/2020 Fluoroscopy was utilized by the requesting physician.  No radiographic interpretation.   DG C-Arm 1-60 Min-No Report  Result Date: 12/24/2020 Fluoroscopy was utilized by the requesting physician.  No radiographic interpretation.   DG C-Arm 1-60 Min-No Report  Result Date: 12/24/2020 Fluoroscopy was utilized by the requesting physician.  No radiographic interpretation.    Discharge Instructions     Incentive spirometry RT   Complete by: As directed         Follow-up Information     Venita Lick, MD Follow up in 2 week(s).    Specialty: Orthopedic Surgery Why: If symptoms worsen, For suture removal, For wound re-check Contact information: [address] [place] [address] [phone]         Care, [facility] Follow up.   Specialty: Home Health Services Why: for home health services. They will be in contact in 1-2 days to set up your first home health appointment Contact information: [address] [place] [address] [phone]                 Discharge Plan:  discharge to home  Disposition: stable    Signed: Rhodia Albright for [facility] PA-C [facility] [phone] 01/01/2021, 5:05 PM

## 2021-02-02 ENCOUNTER — Ambulatory Visit: Payer: Medicare Other | Admitting: Family

## 2021-02-15 DIAGNOSIS — L600 Ingrowing nail: Secondary | ICD-10-CM | POA: Insufficient documentation

## 2021-02-23 ENCOUNTER — Ambulatory Visit: Payer: Medicare Other | Admitting: Family Medicine

## 2021-03-31 ENCOUNTER — Telehealth: Payer: Self-pay | Admitting: Family Medicine

## 2021-03-31 NOTE — Telephone Encounter (Signed)
Pt called wanting to speak to the provider. Pt did not want to give reason why at first then stated that it is due to equilibrium issues. Please advise.

## 2021-03-31 NOTE — Telephone Encounter (Signed)
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 When he was [facility] with [facility] they had referred to neurologist for his equilibrium/balance concerns. He never necessarily wanted to go there but was following with who he was sent to. He is wanting to keep all of his care in [place] system and is wanting to follow his care here. I advised the pt that Dr Brett Fairy had an opening tomorrow and we could address this and discuss further with her to determine if she would have any additional therapy to add and if willing to see for the other concerns. Pt agreed to this plan.

## 2021-04-01 ENCOUNTER — Other Ambulatory Visit: Payer: Self-pay

## 2021-04-01 ENCOUNTER — Encounter: Payer: Self-pay | Admitting: Neurology

## 2021-04-01 ENCOUNTER — Ambulatory Visit (INDEPENDENT_AMBULATORY_CARE_PROVIDER_SITE_OTHER): Payer: Medicare Other | Admitting: Neurology

## 2021-04-01 VITALS — BP 160/74 | HR 72 | Ht 69.0 in | Wt 191.0 lb

## 2021-04-01 DIAGNOSIS — R260 Ataxic gait: Secondary | ICD-10-CM

## 2021-04-01 DIAGNOSIS — F1090 Alcohol use, unspecified, uncomplicated: Secondary | ICD-10-CM | POA: Diagnosis not present

## 2021-04-01 DIAGNOSIS — I6522 Occlusion and stenosis of left carotid artery: Secondary | ICD-10-CM | POA: Diagnosis not present

## 2021-04-01 DIAGNOSIS — I4891 Unspecified atrial fibrillation: Secondary | ICD-10-CM

## 2021-04-01 DIAGNOSIS — Z9889 Other specified postprocedural states: Secondary | ICD-10-CM

## 2021-04-01 DIAGNOSIS — I480 Paroxysmal atrial fibrillation: Secondary | ICD-10-CM

## 2021-04-01 NOTE — Patient Instructions (Signed)
Ataxia Ataxia is a condition that causes unsteadiness when walking and standing, poor coordination of body movements, and difficulty keeping a straight (upright) posture. A problem with the part of the brain that controls coordination and stability (cerebellum) can cause this condition. Ataxia can develop later in life (acquired ataxia), during your 20s or 30s, or into your 60s or later. This type of ataxia develops when another medical condition, such as a stroke, damages the cerebellum. Ataxia also may be present early in life (non-acquired ataxia). There are two main types of non-acquired ataxia: Congenital. This type is present at birth. Hereditary. This type is passed from parent to child. The most common form of hereditary non-acquired ataxia is Friedreich ataxia. What are the causes? Acquired ataxia may be caused by: Changes in the nervous system (neurodegenerative changes). Changes throughout the body (systemic disorders). A lot of exposure to: Certain medicines, such as phenytoin and lithium. Solvents. These are cleaning fluids, such as paint thinner, nail polish remover, carpet cleaner, and degreasers. Alcohol abuse. Medical conditions, such as: Celiac disease. Hypothyroidism. A lack (deficiency) of vitamin E, vitamin B12, or thiamine. Brain tumors. Multiple sclerosis. Cerebral palsy. Stroke. Paraneoplastic syndromes. These are rare disorders triggered by the body's defense system (immune system) in response to a cancerous tumor. Viral infections. Head injury. Malnutrition. Congenital and hereditary ataxia are caused by problems that are present in genes before birth. What are the signs or symptoms? Signs and symptoms of ataxia may vary, depending on the cause. They may include: Being unsteady. Walking with the legs wide apart to keep one's balance. Uncontrolled shaking (tremor). Poorly coordinated body movements. Difficulty maintaining an upright posture. Fatigue. Changes  in speech, vision, or both. Involuntary eye movements. Difficulty swallowing. Difficulty writing. Muscle tightening that you cannot control (muscle spasms). How is this diagnosed? Ataxia may be diagnosed based on: Your personal and family medical history. A physical exam. Imaging tests, such as a CT scan or MRI. Spinal tap (lumbar puncture). This procedure involves using a needle to take a sample of the fluid around your brain and spinal cord. Genetic testing. Blood work. How is this treated? The underlying condition that causes ataxia needs to be treated. If the cause is a brain tumor, you may need surgery. Treatment also focuses on improving your quality of life. This may involve: Physical therapy. This helps you to learn ways to improve coordination and move around more carefully. Occupational therapy. This helps you to improve your ability to do daily tasks, such as bathing and feeding yourself. Using devices to help you move around, eat, or communicate. These are called assistive devices, and they include a walker, modified eating utensils, and communication aids. Speech therapy. This helps you to learn ways to improve speech and swallowing. Follow these instructions at home: Preventing falls Lie down right away if you become very unsteady, dizzy, or nauseous, or if you feel like you are going to faint. Do not get up until all of those feelings pass. Keep your home well-lit. Use night-lights as needed. Remove tripping hazards, such as throw rugs, cords, and clutter. Install grab bars by the toilet and in the tub and shower. Use assistive devices, such as a cane, walker, or wheelchair as needed to keep your balance. General instructions Do not drink alcohol. Ask your health care provider what activities are safe for you, and what activities you should avoid. Take over-the-counter and prescription medicines only as told by your health care provider. Contact a health care provider  if:   You have increasing unsteadiness or fall. You need equipment to help with walking, such as a cane or walker. You have difficulty with swallowing. Get help right away if: You have unsteadiness that suddenly worsens. You have any of these: Severe headaches. Chest pain. Abdominal pain. Weakness or numbness on one side of your body. Vision problems. Difficulty speaking. An irregular heartbeat. A very fast pulse. You feel confused. These symptoms may represent a serious problem that is an emergency. Do not wait to see if the symptoms will go away. Get medical help right away. Call your local emergency services (911 in the [place] Do not drive yourself to the hospital. Summary Ataxia is a condition that causes unsteadiness when walking and standing, poor coordination of body movements, and difficulty keeping a straight (upright) posture. Ataxia occurs because of a problem with the part of the brain that controls coordination and stability (cerebellum). The underlying condition that causes your ataxia needs to be treated. Treatment also focuses on improving your quality of life. This information is not intended to replace advice given to you by your health care provider. Make sure you discuss any questions you have with your health care provider. Document Revised: 03/08/2020 Document Reviewed: 03/08/2020 Elsevier Patient Education  2022 Elsevier Inc.  

## 2021-04-01 NOTE — Progress Notes (Signed)
Quick Note:  AHI of 4.3, 100% compliance at 6 cm water pressure CPAP with 2 cm EPR.   6 hours and 21 minutes nightly user time .    SLEEP MEDICINE CLINIC   Provider:  Larey Seat, MD   Primary Care Physician:  Brandon. Vita Mendoza  Referring Provider:  Dr Antony Contras, MD    Chief Complaint  Patient presents with   Follow-up    Rm 10, alone. Pt reports doing well on CPAP therapies. Pt would like to discuss other concerns today.     HPI:  Brandon Mendoza is a 81 y.o. male patient , originally referred by Brandon Mendoza 2014 after a stroke and followed in the sleep clinic for OSA on CPAP. Followed by Brandon Presto, NP. He has his primary and cardiology care in [facility].   So I have the pleasure of meeting this gentleman now after many years of not being actively involved in his care.  His orthopedist Brandon. Melina Mendoza had referred him to my colleague Brandon. Lavell Mendoza.  Today's concern is about frequent falling and neuropathy -apparently balance issues. The patient underwent cardiac monitoring in 2021, he was diagnosed with atrial fibrillation by his [facility] cardiology office and then started on Eliquis as anticoagulant. He then underwent tilt table testing with cardiology and a loop recorder was finally implanted.  He has not had any messages about abnormal heart rhythm since. While on Eliquis he suffered several falls and this is one of his big problems he has used which hematoma and he showed me the pictures to prove it both knees have been scrapped and scabbed. He was then referred to a neurologist in [facility], Brandon. Ermalene Mendoza at [place].  Brandon. Ermalene Mendoza did nerve conduction and EMG testing, he also underwent spinal tap with CSF testing and nothing showed up abnormal Brandon. Ermalene Mendoza did state that the patient had neuropathy but he could not identify a cause the patient is not diabetic, he is not actively smoking.  He did not have any vitamin deficiencies. H the patient continues to fall and broke  his foot. Brandon Mendoza treated his foot.  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  He is twice a day involved with the ultrasound stimulation of his lower extremity. He wants no longer to see Brandon Mendoza nor the cardiologist.   He wanted to see Brandon Mendoza, as recommended by Brandon Mendoza.    As to his apnea - he has no concerns , was calling just yesterday- and got this appointment with completely different set of problem.  Brandon Mendoza referral - He had an L4-5 decompression with level of interspinous process expander and instrumented fusion. Overall he is beginning to make improvements. We will stop his narcotic medications, muscle relaxers, and gabapentin. He will start diclofenac and Tylenol for discomfort. The wound was healing well.  He is now pain free-      Last seen by me in [place] on 03-22-2018 for CPAP compliance but did not bring his CPAP nor the memory chip: He has 3 times nocturia and still snores, and he wants to watch TV in his bedroom- his wife has moved to another room. He has several Martinis each night and takes  Xanax (!).  Brandon Mendoza is a meanwhile 81 year old Caucasian right-handed gentleman with a history of stroke and aftercare through Brandon. Leonie Mendoza and later through Brandon. Erlinda Mendoza.  He has been on his CPAP for about 6 years now.  I do not have the compliance data available but he will bring them by today or tomorrow.  A discussion is if he needs a new machine he is entitled to 1, I would like for him to have the best possible and interrupted apnea care and is my experience that the machines are set with a software that makes him obsolete after 6 to 7 years anyway. He will undergo HST but is not willing to undergo PSG. He understands  this will likely lead to a autotitration CPAP, nasal pillows.   He was seen here as in a referral  from Brandon. Erlinda Mendoza in 2017. He is an establsished stroke patient , who had undergone a sleep study in 2013 and has been using his CPAP - Compliantly since. He had no follow up in the sleep clinic in the last 3 years and has no recollection of seeing me before. I have a note here from my last visit with Brandon Mendoza from 14 May 2012 the patient was at the time followed by Brandon. 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  Carotid Doppler showed a left ICA stenosis 60-80%, he was briefly hospitalized with very high systolic blood pressures but did well afterwards he was tolerating aspirin and Plavix.  I was able to follow up with a sleep study on 08 March 2012 and the patient was tested positive for sleep apnea, his apnea was strictly obstructive in origin but to a high degree his AHI was 59.1, REM AHI was under 2.1, he slept all night supine.  Nadir of oxygen saturation was at 86% with 19 minutes of desaturation time totally.  No PLMs.  He was titrated to CPAP at a very low pressure of only 6 cmH2O which alleviated his apnea completely.  He has continued to use his CPAP compliantly ever since and recently tried to get new supplies but he was told that he has to follow up with me first.  Due to the new Medicare requirements he will now be seen once a year.  I was able to get a compliance report on his machine which is Wi-Fi compatible.  He is 100% compliant for the last 30 days with 83 compliance 4 hours.  His average use of time is 5 hours and 1 minute at night. He feels that the CPAP helps him sleep, he still has 2 times nocturia each night and often does not put the mask back on after his bathroom break therefore the reduced number of hours.  He  sleeps at least 7 hours nightly.  His residual AHI is 2.6 on a setting of 6 cm water pressure with 2 cm EPR and the residual apneas are obstructive in nature.  I would not see any reason to change him from his current machine or setting he has very few air leaks.  He endorsed a low degree of fatigue at 9 point and the Epworth sleepiness score at only 5 points, the geriatric depression score was endorsed at 2 out of 15 points.  Basically I will have to certify today that the patient  is compliant and that he can get new supplies. The patient is insured through [employer].   Sleep habits are as follows:  Patient sleeps 7 hours on average  at night, 2 nocturias.  He sleeps in a differenet bedroom form his wife, watches TV in Bed, but wears his CPAP after 10 PM, and drifts to sleep when the TV is still on.  He rises at 5.30 AM and goes to gym 6 mornings a week, and on other days goes hunting.   Sleep medical history and family sleep history: daughter has OSA on CPAP, lives in [place]. Father had OSA , untreated.   Social history: married, adult children.   Review of Systems: Out of a complete 14 system review, the patient complains of only the following symptoms, and all other reviewed systems are negative. Rhinitis, dry mouth.   Result Notes Component Ref Range & Units 3 mo ago  (12/10/20) 6 mo ago  (09/16/20) 6 mo ago  (09/15/20) 6 mo ago  (09/14/20) 9 yr ago  (12/10/11) 9 yr ago  (11/29/11)  Sodium 135 - 145 mmol/L 137  135  132 Low   132 Low   136 R  140 R   Potassium 3.5 - 5.1 mmol/L 4.0  4.2  4.5  4.5  4.1 R  3.8 R   Chloride 98 - 111 mmol/L 101  104  105  100  103 R  103 R   CO2 22 - 32 mmol/L 26  24  22  21  Low   24 R  25 R   Glucose, Bld 70 - 99 mg/dL 72  91 CM  109 High  CM  185 High  CM  134 High   135 High    Comment: Glucose reference range applies only to samples taken after fasting for at least 8 hours.  BUN 8 - 23 mg/dL 21  25 High   29 High   45 High   15 R  22 R   Creatinine,  Ser 0.61 - 1.24 mg/dL 1.18  1.13  1.29 High   1.83 High   0.86 R  1.24 R   Calcium 8.9 - 10.3 mg/dL 9.6  8.4 Low   7.9 Low   8.6 Low   8.7 R  9.7 R   GFR, Estimated >60 mL/min >60  >60 CM  56 Low  CM  37 Low  CM     Comment: (NOTE)  Calculated using the CKD-EPI Creatinine Equation (2021)   Anion gap 5 - 15 10  7  CM  5 CM  11 CM     Comment: Performed at [facility], [place] [address] [place], [place] [address]  Resulting Agency  [facility] [facility] [place] [facility] [place] [facility] [place] [facility]      Epworth score  5, Fatigue severity score 9  , depression score 2/1 5    Social History   Socioeconomic History   Marital status: Married    Spouse name: Arville Go   Number of children: 4   Years of education: College   Highest education level: Not on file  Occupational History   Occupation: Retired  Tobacco Use   Smoking status: Former    Packs/day: 1.00    Years: 50.00    Pack years: 50.00    Types: Cigarettes    Quit date: 08/26/2004    Years since quitting: 16.6   Smokeless tobacco: Never  Tobacco comments:    quit smoking 08/2004  Substance and Sexual Activity   Alcohol use: Not Currently    Alcohol/week: 14.0 standard drinks    Types: 14 Shots of liquor per week    Comment: 2-3 daily cocktails   Drug use: No   Sexual activity: Not on file  Other Topics Concern   Not on file  Social History Narrative   Patient is married with 4 children.   Patient is right handed.   Patient has college education.   Patient drinks 3 cups daily.   Social Determinants of Health   Financial Resource Strain: Not on file  Food Insecurity: Not on file  Transportation Needs: Not on file  Physical Activity: Not on file  Stress: Not on file  Social Connections: Not on file  Intimate Partner Violence: Not on file    Family History  Problem Relation Age of Onset   Alzheimer's disease Father    Diabetes Other     Past Medical History:  Diagnosis Date   Anxiety    Carotid  artery occlusion    COPD (chronic obstructive pulmonary disease) (HCC)    Depression    Dysrhythmia    PAF, atrial tachycardia   Hx of colonic polyps    Hyperlipidemia    Hypertension    Nocturia    Peripheral vascular disease (HCC)    Sleep apnea    on 6 cm, nasal pillow   Stroke (HCC) 05/01/2011   ischemic    Past Surgical History:  Procedure Laterality Date   CAROTID ENDARTERECTOMY  12/09/11   Left cea   ENDARTERECTOMY  12/09/2011   Procedure: ENDARTERECTOMY CAROTID;  Surgeon: Larina Earthlyodd F Early, MD;  [facility];  Service: Vascular;  Laterality: Left;   EYE SURGERY     rt retina detachment   HAMMER TOE SURGERY  2004   HERNIA REPAIR  2006   SHOULDER ARTHROSCOPY W/ ROTATOR CUFF REPAIR  2010   TONSILLECTOMY      Current Outpatient Medications  Medication Sig Dispense Refill   ALPRAZolam (XANAX) 0.5 MG tablet Take 0.25 mg by mouth at bedtime.     Ascorbic Acid (VITAMIN C PO) Take 2 tablets by mouth in the morning.     flecainide (TAMBOCOR) 100 MG tablet Take 100 mg by mouth 2 (two) times daily.     lisinopril (PRINIVIL,ZESTRIL) 10 MG tablet Take 10 mg by mouth every evening.     rosuvastatin (CRESTOR) 10 MG tablet Take 10 mg by mouth at bedtime.     sertraline (ZOLOFT) 100 MG tablet Take 1 tablet (100 mg total) by mouth daily. 15 tablet 0   No current facility-administered medications for this visit.    Allergies as of 04/01/2021   (No Known Allergies)    Vitals: BP (!) 160/74    Pulse 72    Ht 5\' 9"  (1.753 m)    Wt 191 lb (86.6 kg)    BMI 28.21 kg/m  Last Weight:  Wt Readings from Last 1 Encounters:  04/01/21 191 lb (86.6 kg)   BJY:NWGNBMI:Body mass index is 28.21 kg/m.      Last Height:   Ht Readings from Last 1 Encounters:  04/01/21 5\' 9"  (1.753 m)    Physical exam:  General: The patient is awake, alert and appears not in acute distress. The patient is well groomed. Head: Normocephalic, atraumatic. Neck is supple. Mallampati 3  neck circumference:17.5 . Nasal  airflow congested , Retrognathia is mild.  Cardiovascular:  Regular  rate and rhythm without  murmurs or carotid bruit, and without distended neck veins. Respiratory: Lungs are clear to auscultation. Skin:  Without evidence of edema,  bruising on knees,  Trunk: BMI is 28. The patient's posture is erect  Neurologic exam : The patient is awake and alert, oriented to place and time.   Attention span & concentration ability appears normal.  [facility] testing in [facility].   Speech is fluent,  without dysarthria, dysphonia or aphasia.  Mood and affect are appropriate.  Cranial nerves: Taste and smell intact. Left Pupils larger than right, cataract surgery, sluggish reactive to light.  Status post cataract surgery. Extraocular movements  in vertical and horizontal planes intact and without nystagmus.  Visual fields by finger perimetry are intact. Hearing to finger rub impaired.  Hearing loss.  Facial motor strength is symmetric and tongue and uvula move midline. Shoulder shrug was symmetrical.    Motor strength. Good grip strength, good grip strength. Proximal hip flexion. Hammertoes.   DTR symmetric, rather brisk. \ Sensory: slight reduction of vibration, left more than right ankle, reports numbness in feet. Can stand on tandem.  Has PT for gait twice a week.  The patient now walks with a hiking pool. He reports that he feels his legs get tired and his knees may buckle.  His gait is remarkable for the wide base he puts his feet rather far apart from another he does not walk with any rotational capacity at the lumbar spine.  When he reports when he turns he needed 4-5 steps to turn 180 degrees.  As long as his eyes are open and he can see the path in front of him his gait appears steady but somewhat rigid.  He had a normal arm swing.  Upon returning into the exam rooms he showed an abnormal Romberg maneuver standing with his eyes closed caused him to sway quite significantly and there was no major  direction could be left right forward or backward.  So the Romberg maneuver tells me that he has some balance problem that is related to his falls.      Assessment:  After physical and neurologic examination, review of laboratory studies,  Personal review of imaging studies, reports of other /same  Imaging studies, results of polysomnography and / or neurophysiology testing and pre-existing records as far as provided in visit., my assessment is:  1) Recurrent falls  and positive romberg. 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 Not a diabetic.  3) remote history of stroke. Left CEA-  Dx of atrial fib, on Flecainide.  4) could he have had another stroke? I will order a repeat MRI.       The patient was advised of the nature of the diagnosed disorder , the treatment options and the  risks for general health and wellness arising from not treating the condition.   I spent more than 20 minutes of face to face time with the patient.  Greater than 50% of time was spent in counseling and coordination of care. We have discussed the diagnosis and differential and I answered the patient's questions.    Plan:  Treatment plan and additional workup :   MRI brain and records from his [facility] neurologist.   Signing out Brandon Roxy Cedar tests and result.  I was not able to see Brandon [name] reports.   Continue with PT and exercises.  Larey Seat, MD [id number], [id number] PM  Certified in Neurology by ABPN Certified in [place] by [facility] [address], [place], [place] [address]           ______

## 2021-04-12 ENCOUNTER — Other Ambulatory Visit: Payer: Self-pay

## 2021-04-12 ENCOUNTER — Ambulatory Visit (HOSPITAL_COMMUNITY)
Admission: RE | Admit: 2021-04-12 | Discharge: 2021-04-12 | Disposition: A | Discharge status: Home / Self Care | Payer: Medicare Other | Source: Ambulatory Visit | Attending: Neurology | Admitting: Neurology

## 2021-04-12 DIAGNOSIS — R260 Ataxic gait: Secondary | ICD-10-CM | POA: Insufficient documentation

## 2021-04-12 DIAGNOSIS — Z9889 Other specified postprocedural states: Secondary | ICD-10-CM | POA: Insufficient documentation

## 2021-04-12 DIAGNOSIS — I480 Paroxysmal atrial fibrillation: Secondary | ICD-10-CM | POA: Insufficient documentation

## 2021-04-12 DIAGNOSIS — F1090 Alcohol use, unspecified, uncomplicated: Secondary | ICD-10-CM | POA: Insufficient documentation

## 2021-04-12 DIAGNOSIS — I6522 Occlusion and stenosis of left carotid artery: Secondary | ICD-10-CM | POA: Insufficient documentation

## 2021-04-12 DIAGNOSIS — I4891 Unspecified atrial fibrillation: Secondary | ICD-10-CM | POA: Insufficient documentation

## 2021-04-12 IMAGING — MR MR HEAD W/O CM
12 of 13 series · 44 of 48 positions shown · non-contrast
Comparison: Head MRI [DATE]

.  CLINICAL DATA:
Neuro deficit, neurodegenerative disorder suspected ataxia. Alcohol
use. Atrial fibrillation. Carotid artery stenosis. Multiple recent
falls.

EXAM:
MRI HEAD WITHOUT CONTRAST
TECHNIQUE: Multiplanar, multiecho pulse sequences of the brain and surrounding
structures were obtained without intravenous contrast.

[Series 5: DWI · axial · 3.0mm · 0.92mm/px · z∈[-86,+65]mm · 7 of 104 slices shown (1 of 4)]
[im 1/104]
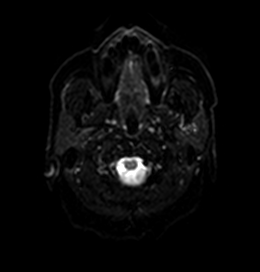
[im 18/104]
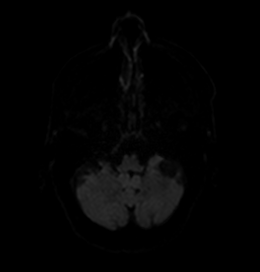
[im 35/104]
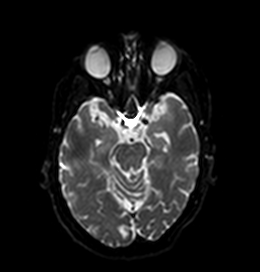
[im 52/104]
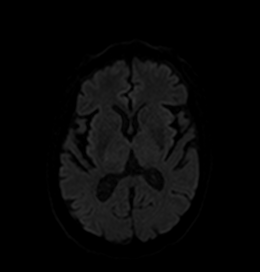
[im 69/104]
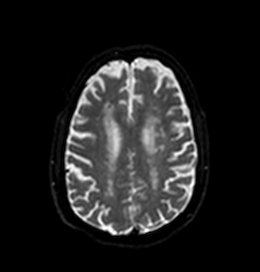
[im 86/104]
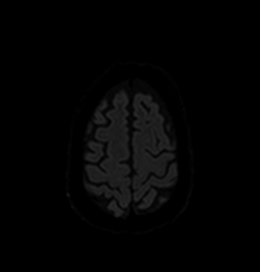
[im 104/104]
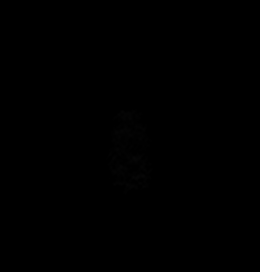

[Series 6: DWI · axial · 3.0mm · 0.92mm/px · z∈[-86,+65]mm · 4 of 52 slices shown (2 of 4)]
[im 1/52]
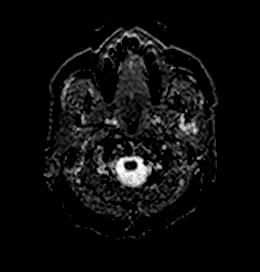
[im 18/52]
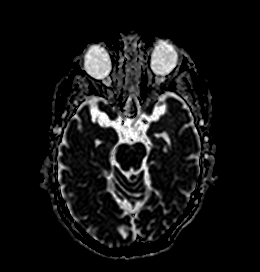
[im 35/52]
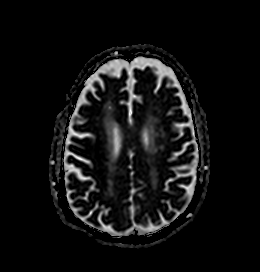
[im 52/52]
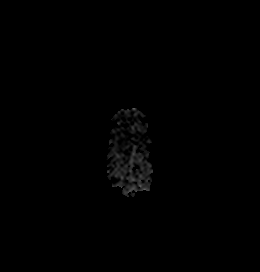

[Series 7: DWI · coronal · 4.0mm · 0.88mm/px · 6 of 76 slices shown (3 of 4)]
[im 1/76]
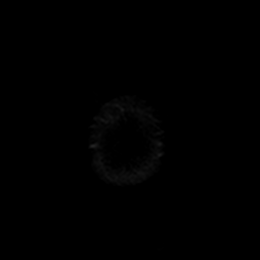
[im 16/76]
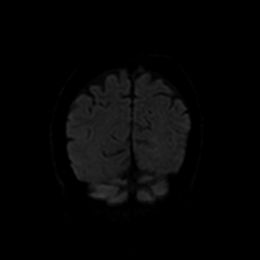
[im 31/76]
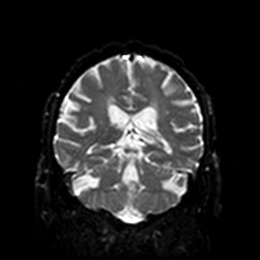
[im 46/76]
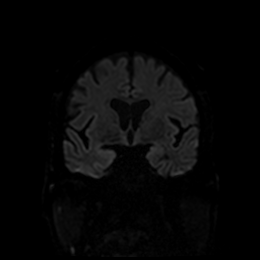
[im 61/76]
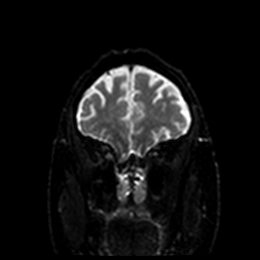
[im 76/76]
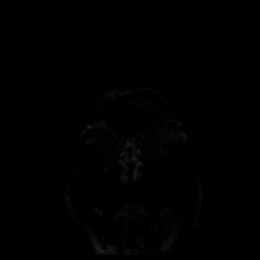

[Series 8: DWI · coronal · 4.0mm · 0.88mm/px · 3 of 38 slices shown (4 of 4)]
[im 1/38]
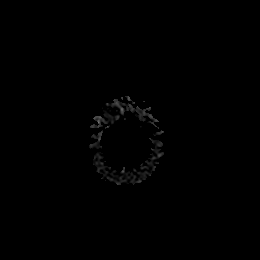
[im 19/38]
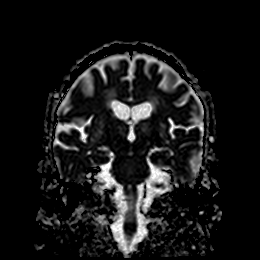
[im 38/38]
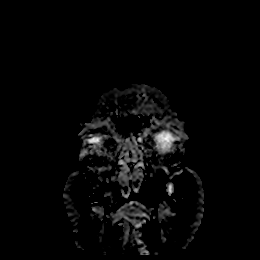

[Series 9: T1 · sagittal · 5.0mm · 0.75mm/px · 2 of 25 slices shown]
[im 1/25]
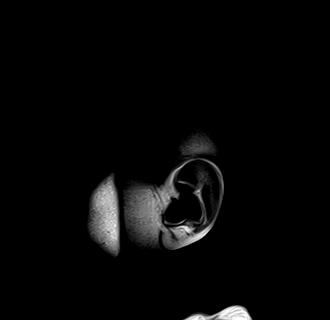
[im 25/25]
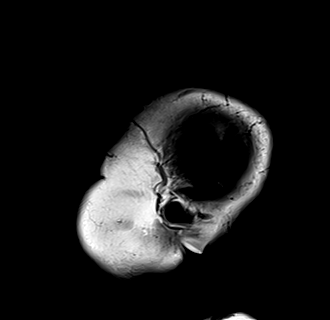

[Series 10: T2 · axial · 5.0mm · 0.72mm/px · z∈[-91,+57]mm · 2 of 26 slices shown (1 of 2)]
[im 1/26]
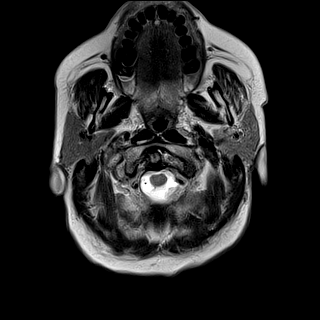
[im 26/26]
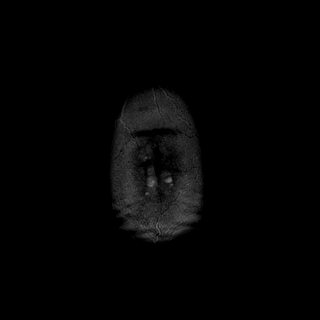

[Series 11: FLAIR · axial · 5.0mm · 0.45mm/px · z∈[-92,+56]mm · 2 of 26 slices shown]
[im 1/26]
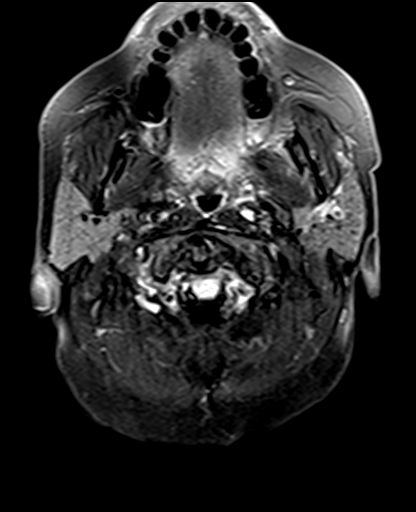
[im 26/26]
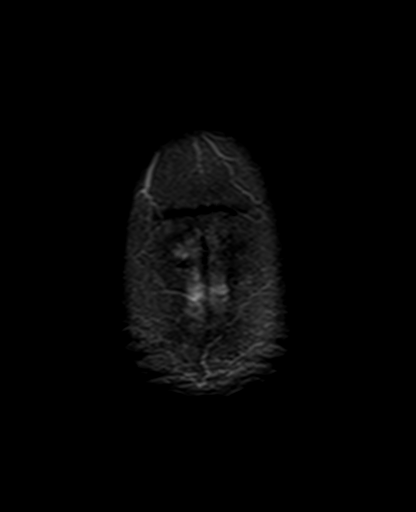

[Series 12: mag_images · axial · 3.0mm · 0.90mm/px · z∈[-101,+61]mm · 4 of 56 slices shown]
[im 1/56]
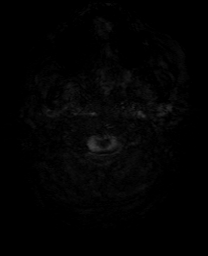
[im 19/56]
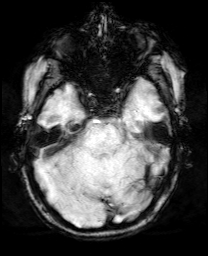
[im 37/56]
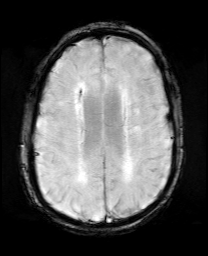
[im 56/56]
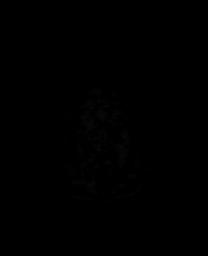

[Series 13: pha_images · axial · 3.0mm · 0.90mm/px · z∈[-101,+58]mm · 4 of 55 slices shown]
[im 1/55]
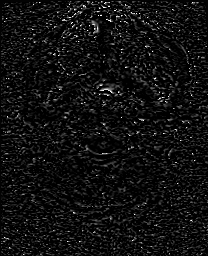
[im 19/55]
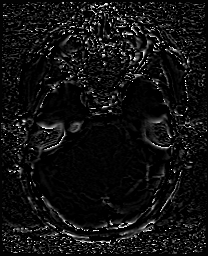
[im 37/55]
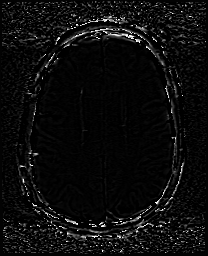
[im 55/55]
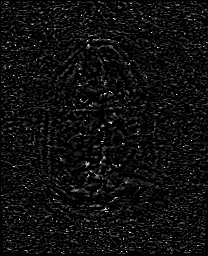

[Series 14: swi_images · axial · 3.0mm · 0.90mm/px · z∈[-101,+61]mm · 4 of 56 slices shown]
[im 1/56]
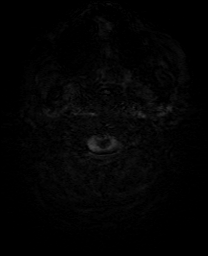
[im 19/56]
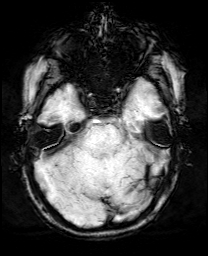
[im 37/56]
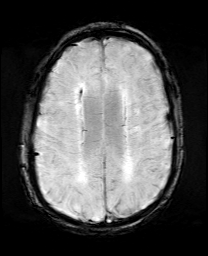
[im 56/56]
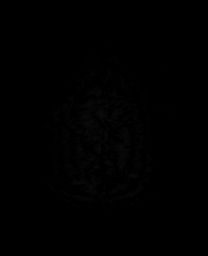

[Series 15: mip_images(sw) · axial · 24.0mm · 0.90mm/px · z∈[-91,+51]mm · 4 of 49 slices shown]
[im 1/49]
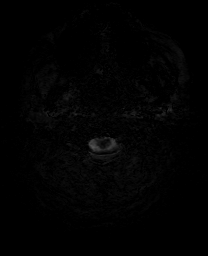
[im 17/49]
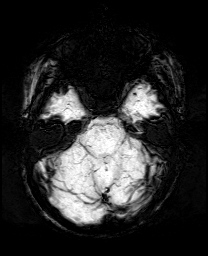
[im 33/49]
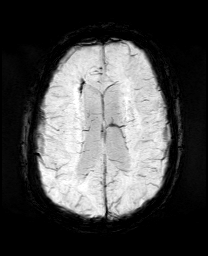
[im 49/49]
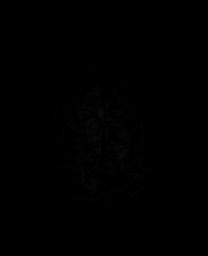

[Series 17: T2 · coronal · 5.0mm · 0.34mm/px · 2 of 31 slices shown (2 of 2)]
[im 1/31]
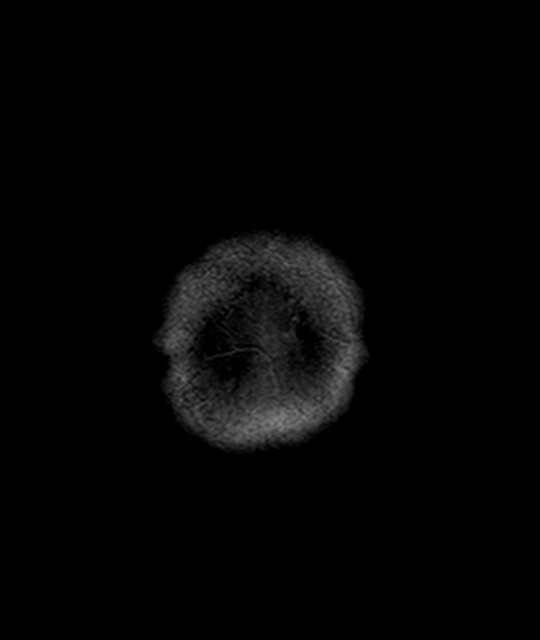
[im 31/31]
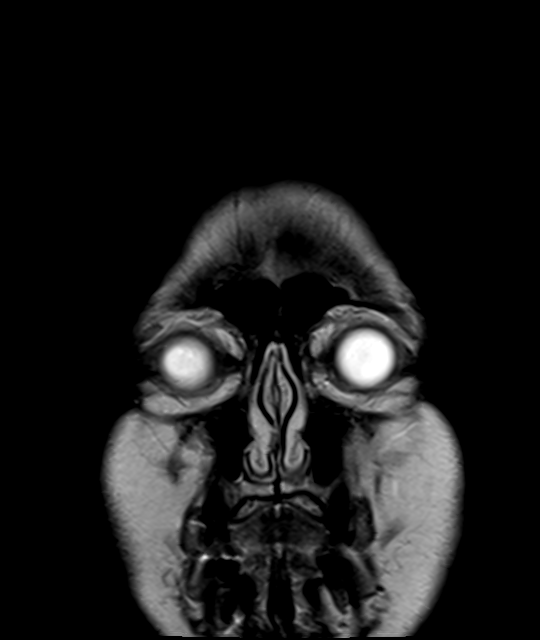

[44 of 48 positions shown; findings below may reference images not displayed]

FINDINGS: Brain: There is no evidence of an acute infarct, mass, midline
shift, or extra-axial fluid collection. Patchy T2 hyperintensities
in the cerebral white matter, deep gray nuclei, and pons are
unchanged and nonspecific but compatible with moderate chronic small
vessel ischemic disease. A chronic microhemorrhage is again noted in
the left lentiform nucleus. A developmental venous anomaly is noted
in the right frontal lobe. Mild generalized cerebral atrophy is
unchanged. No focal cerebellar insult is identified.

Vascular: Major intracranial vascular flow voids are preserved.

Skull and upper cervical spine: Unremarkable bone marrow signal.

Sinuses/Orbits: Bilateral cataract extraction. Paranasal sinuses and
mastoid air cells are clear.

Other: None.
IMPRESSION: 1. No acute intracranial abnormality.
2. Moderate chronic small vessel ischemic disease.

## 2021-04-13 ENCOUNTER — Encounter: Payer: Self-pay | Admitting: Neurology

## 2021-04-16 ENCOUNTER — Telehealth: Payer: Self-pay | Admitting: Neurology

## 2021-04-16 ENCOUNTER — Other Ambulatory Visit: Payer: Self-pay | Admitting: Gastroenterology

## 2021-04-16 DIAGNOSIS — N281 Cyst of kidney, acquired: Secondary | ICD-10-CM

## 2021-04-16 NOTE — Telephone Encounter (Signed)
Pt is asking for a call to know if MRI results can be mailed to him.  Pt also wants to know if the records from his previous Neurologist Dr Charmayne Sheer and from Dr [name] have been received

## 2021-04-19 ENCOUNTER — Telehealth: Payer: Self-pay | Admitting: *Deleted

## 2021-04-19 NOTE — Telephone Encounter (Signed)
R/c note from [facility]. Notes in nurse pod.

## 2021-04-19 NOTE — Telephone Encounter (Signed)
[name], can you help with this? Thank you 

## 2021-04-23 ENCOUNTER — Other Ambulatory Visit: Payer: Self-pay

## 2021-04-23 ENCOUNTER — Ambulatory Visit: Payer: Medicare Other | Admitting: Cardiology

## 2021-04-23 ENCOUNTER — Encounter: Payer: Self-pay | Admitting: Cardiology

## 2021-04-23 VITALS — BP 190/94 | HR 62 | Temp 97.9°F | Resp 17 | Ht 69.0 in | Wt 196.0 lb

## 2021-04-23 DIAGNOSIS — I10 Essential (primary) hypertension: Secondary | ICD-10-CM

## 2021-04-23 DIAGNOSIS — I480 Paroxysmal atrial fibrillation: Secondary | ICD-10-CM

## 2021-04-23 DIAGNOSIS — E7800 Pure hypercholesterolemia, unspecified: Secondary | ICD-10-CM

## 2021-04-23 DIAGNOSIS — Z9889 Other specified postprocedural states: Secondary | ICD-10-CM

## 2021-04-23 NOTE — Progress Notes (Signed)
Primary Physician/Referring:  Kathe Becton, DO  Patient ID: Brandon Mendoza, male    DOB: 09/09/1940, 81 y.o.   MRN: [id number]  Chief Complaint  Patient presents with   New Patient (Initial Visit)   SECOND OPINION   History of left-sided carotid endarterectomy   HPI:    Brandon Mendoza  is a 81 y.o. Caucasian male patient with hypertension, hyperlipidemia, remote stroke with left carotid endarterectomy and follows Dr. Gae Gallop, OSA on CPAP, remote tobacco use disorder, presents to establish care to discuss regarding management of atrial fibrillation.  Patient was noted to have paroxysmal episode of atrial fibrillation on Zio patch monitoring, longest lasting 21 hours in June 2021 after he was noted by neurology to have irregular pulse.  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  He was evaluated by Dr. Bishop Limbo, EP at [facility] and was recommended rhythm control strategy versus watchman device in view of contraindication for anticoagulation, patient opted for rhythm control and beta-blocker was discontinued and placed on flecainide. On anticoagulation due to frequent fall, he had developed significant muscle hematoma and bruising and it was finally discontinued.  Due to frequent falls, underlying sinus bradycardia, he has had tilt table testing on 06/01/2020 which was normal, underwent Medtronic loop recorder implantation on 10/22/2020 for questionable syncope and atrial fibrillation.  So far he has not had any further episodes of atrial fibrillation.  Patient is also concerned about loop implantation and that there is no communication from his cardiologist.  Wanted to establish with me.  Over the past 18 months he has become very inactive due to significant idiopathic neuropathy of bilateral lower extremity and severe gait instability and  frequent falls.  Previous to that he was very active and played racquetball and swimming and other physical activity.    Past Medical History:  Diagnosis Date   Anxiety    Carotid artery occlusion    COPD (chronic obstructive pulmonary disease) (HCC)    Depression    Dysrhythmia    PAF, atrial tachycardia   Hx of colonic polyps    Hyperlipidemia    Hypertension    Nocturia    Peripheral vascular disease (HCC)    Sleep apnea    on 6 cm, nasal pillow   Stroke ([place]) 05/01/2011   ischemic   Past Surgical History:  Procedure Laterality Date   CAROTID ENDARTERECTOMY  12/09/11   Left cea   ENDARTERECTOMY  12/09/2011   Procedure: ENDARTERECTOMY CAROTID;  Surgeon: Rosetta Posner, MD;  Location: [facility];  Service: Vascular;  Laterality: Left;   EYE SURGERY     rt retina detachment   HAMMER TOE SURGERY  2004   HERNIA REPAIR  2006   SHOULDER ARTHROSCOPY W/ ROTATOR CUFF REPAIR  2010   TONSILLECTOMY     Family History  Problem Relation Age of Onset   Alzheimer's disease Father    Diabetes Other     Social History   Tobacco Use   Smoking status: Former    Packs/day: 1.00    Years: 50.00    Pack years: 50.00    Types: Cigarettes    Quit date: 08/26/2004    Years since quitting: 16.6   Smokeless tobacco: Never   Tobacco comments:    quit smoking 08/2004  Substance Use Topics   Alcohol use: Yes  Alcohol/week: 14.0 standard drinks    Types: 14 Shots of liquor per week    Comment: 2-3 daily cocktails   Marital Status: Married  ROS  Review of Systems  Cardiovascular:  Negative for chest pain, dyspnea on exertion and leg swelling.  Gastrointestinal:  Negative for melena.  Neurological:  Positive for focal weakness (legs) and loss of balance.  Objective  Blood pressure (!) 190/94, pulse 62, temperature 97.9 F (36.6 C), temperature source Temporal, resp. rate 17, height 5\' 9"  (1.753 m), weight 196 lb (88.9 kg), SpO2 97 %. Body mass index is 28.94 kg/m.  Vitals with BMI  04/23/2021 04/23/2021 04/01/2021  Height - 5\' 9"  5\' 9"   Weight - 196 lbs 191 lbs  BMI - 28.93 28.19  Systolic 190 206 05/30/2021  Diastolic 94 80 74  Pulse 62 68 72    Physical Exam Neck:     Vascular: No carotid bruit (Left CEA scar noted) or JVD.  Cardiovascular:     Rate and Rhythm: Normal rate and regular rhythm.     Pulses: Intact distal pulses.     Heart sounds: Normal heart sounds. No murmur heard.   No gallop.  Pulmonary:     Effort: Pulmonary effort is normal.     Breath sounds: Normal breath sounds.  Abdominal:     General: Bowel sounds are normal.     Palpations: Abdomen is soft.  Musculoskeletal:        General: No swelling.  Neurological:     Gait: Gait abnormal.     Medications and allergies  Not on File   Medication prior to this encounter:   Outpatient Medications Prior to Visit  Medication Sig Dispense Refill   ALPRAZolam (XANAX) 0.5 MG tablet Take 0.25 mg by mouth at bedtime.     Ascorbic Acid (VITAMIN C PO) Take 2 tablets by mouth in the morning.     diclofenac (VOLTAREN) 75 MG EC tablet Take 1 tablet by mouth as needed.     flecainide (TAMBOCOR) 100 MG tablet Take 100 mg by mouth 2 (two) times daily.     lisinopril (PRINIVIL,ZESTRIL) 10 MG tablet Take 10 mg by mouth every evening.     Melatonin 5 MG CHEW Chew 5 mg by mouth at bedtime as needed.     MULTIPLE VITAMIN PO Take 1 tablet by mouth daily.     Omega-3 Fatty Acids (FISH OIL) 1000 MG CAPS Take 1 capsule by mouth daily.     rosuvastatin (CRESTOR) 10 MG tablet Take 10 mg by mouth at bedtime.     sertraline (ZOLOFT) 100 MG tablet Take 1 tablet (100 mg total) by mouth daily. 15 tablet 0   No facility-administered medications prior to visit.     Medication list after today's encounter   Current Outpatient Medications  Medication Instructions   ALPRAZolam (XANAX) 0.25 mg, Oral, Daily at bedtime   Ascorbic Acid (VITAMIN C PO) 2 tablets, Oral, Every morning   diclofenac (VOLTAREN) 75 MG EC tablet 1  tablet, Oral, As needed   flecainide (TAMBOCOR) 100 mg, Oral, 2 times daily   lisinopril (ZESTRIL) 10 mg, Oral, Every evening   Melatonin 5 mg, Oral, At bedtime PRN   MULTIPLE VITAMIN PO 1 tablet, Oral, Daily   Omega-3 Fatty Acids (FISH OIL) 1000 MG CAPS 1 capsule, Oral, Daily   rosuvastatin (CRESTOR) 10 mg, Oral, Daily at bedtime   sertraline (ZOLOFT) 100 mg, Oral, Daily    Laboratory examination:   External labs:  Labs 02/23/2021: Potassium 4.8, BUN 23, creatinine 1.15, EGFR 64 mL.   Total cholesterol 155, triglycerides 69, HDL 62, LDL 81.  TSH normal.  A1c 5.1%.  Hb 14.3/HCT 42.2, platelets 227.  Radiology:   No results found.  Cardiac Studies:   Zio Patch Extended out patient EKG monitoring  June 2021:  Patient had a min HR of 45 bpm, max HR of 203 bpm, and avg HR of 70 bpm. Predominant underlying rhythm was Sinus Rhythm.   There were frequent atrial tachycardia episodes, longest 20.6 seconds. Atrial fibrillation occurred, 16% burden, longest lasting 22 hours and 41 minutes with average heart rate of 80 bpm. Occasional PVCs and PACs.   EKG:   EKG 04/23/2021: Sinus rhythm with borderline first-degree AV block at rate of 64 bpm, normal axis, no evidence of ischemia.  Low-voltage complexes.    Assessment     ICD-10-CM   1. AF (paroxysmal atrial fibrillation) (HCC)  I48.0 EKG 12-Lead    2. History of left-sided carotid endarterectomy  Z98.890     3. Primary hypertension  I10     4. Pure hypercholesterolemia  E78.00     5. Stage 3a chronic kidney disease (HCC)  N18.31        There are no discontinued medications.  No orders of the defined types were placed in this encounter.  Orders Placed This Encounter  Procedures   EKG 12-Lead   Recommendations:   CORDARIUS BENNING is a 81 y.o. Patient Race: White or Caucasian [1] male Caucasian male patient with hypertension, hyperlipidemia, remote stroke with left carotid endarterectomy and follows Dr. Gae Gallop,  OSA on CPAP, remote tobacco use disorder, presents to establish care to discuss regarding management of atrial fibrillation.  Patient was noted to have paroxysmal episode of atrial fibrillation on Zio patch monitoring, longest lasting 21 hours in June 2021 after he was noted by neurology to have irregular pulse.  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 He was evaluated by Dr. Bishop Limbo, EP at [facility] and was recommended rhythm control strategy versus watchman device in view of contraindication for anticoagulation, patient opted for rhythm control and beta-blocker was discontinued and placed on flecainide.  Due to frequent fall, advanced age, Medtronic loop recorder implanted on 10/22/2020.  I saw the patient for the first time today, reviewed his chart, patient has extensive medical history and although 81 years of age has remained stable.  I do not have EKG tracings to confirm presence of atrial fibrillation when he had Zio patch monitoring in 2021, but during follow-up evaluation of his loop recorder, the notes recorded by cardiology review brief episodes of atrial tachycardia.  Patient does have risk for atrial fibrillation in view of hypertension that is uncontrolled although patient states that it is well controlled at home and did not want me to make any changes to his medications, known obstructive sleep apnea and?  Excess alcohol use.  He has reduced drinking alcohol to 1 drink a day.  Previously 3-4 drinks a day.  I also do not have any echocardiogram or stress testing that was previously performed on checking Care Everywhere.  We will try to obtain these records.  He was being followed by Dr. Mahala Menghini ([place]).  For now with regard to atrial fibrillation management, fortunately he has not had any recurrence of A.  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  Hence I would recommend continue present medical management.  He needs better blood pressure control, compliance with CPAP again stressed. For hypertension, patient was not too keen on starting a new medication and he plans to continue to monitor his blood pressure and bring recordings to me.  I have not ordered any testing, would like to see him back in 4 weeks for follow-up to close the loop.  I had extensive research performed after the patient left to discuss his medical conditions and management of arrhythmia.     With regard to carotid artery disease, he is being followed by vascular surgery and is presently on statin.  LDL is not at goal and he may need addition of Zetia or increase Crestor to 20 mg, will send my note to his PCP.  Although patient has interest in following up with me, he lives in [facility], his management has been appropriate, I will encourage him to continue to follow-up with Dr. Mahala Menghini for cardiology needs.  I have spent a total of 110 minutes in evaluation of records, face-to-face discussion, recommendations.   Adrian Prows, MD, [facility] 04/23/2021, 10:28 AM Office: [phone]

## 2021-04-26 ENCOUNTER — Telehealth: Payer: Self-pay | Admitting: *Deleted

## 2021-04-26 NOTE — Telephone Encounter (Signed)
R/c notes from Dr Rolena Infante notes in nurse pod.

## 2021-05-05 DIAGNOSIS — K219 Gastro-esophageal reflux disease without esophagitis: Secondary | ICD-10-CM | POA: Insufficient documentation

## 2021-05-21 ENCOUNTER — Telehealth: Payer: Self-pay | Admitting: Cardiology

## 2021-05-21 ENCOUNTER — Other Ambulatory Visit: Payer: Self-pay

## 2021-05-21 ENCOUNTER — Encounter: Payer: Self-pay | Admitting: Cardiology

## 2021-05-21 ENCOUNTER — Ambulatory Visit: Payer: Medicare Other | Admitting: Cardiology

## 2021-05-21 VITALS — BP 169/85 | HR 54 | Temp 97.2°F | Resp 17 | Ht 69.0 in | Wt 189.0 lb

## 2021-05-21 DIAGNOSIS — E7800 Pure hypercholesterolemia, unspecified: Secondary | ICD-10-CM

## 2021-05-21 DIAGNOSIS — I10 Essential (primary) hypertension: Secondary | ICD-10-CM

## 2021-05-21 DIAGNOSIS — I6523 Occlusion and stenosis of bilateral carotid arteries: Secondary | ICD-10-CM

## 2021-05-21 DIAGNOSIS — I480 Paroxysmal atrial fibrillation: Secondary | ICD-10-CM

## 2021-05-21 MED ORDER — ROSUVASTATIN CALCIUM 20 MG PO TABS: 20.0000 mg | ORAL_TABLET | Freq: Every day | ORAL | 3 refills | Status: DC

## 2021-05-21 MED ORDER — ASPIRIN 81 MG PO CHEW: 81.0000 mg | CHEWABLE_TABLET | Freq: Every day | ORAL | 1 refills | Status: DC

## 2021-05-21 MED ORDER — LOSARTAN POTASSIUM-HCTZ 50-12.5 MG PO TABS: 1.0000 | ORAL_TABLET | Freq: Every morning | ORAL | 2 refills | Status: DC

## 2021-05-21 MED ORDER — FLECAINIDE ACETATE 100 MG PO TABS: 100.0000 mg | ORAL_TABLET | Freq: Two times a day (BID) | ORAL | 3 refills | Status: DC

## 2021-05-21 NOTE — Progress Notes (Signed)
Primary Physician/Referring:  Kathe Becton, DO  Patient ID: Brandon Mendoza, male    DOB: 02-04-1941, 81 y.o.   MRN: [id number]  Chief Complaint  Patient presents with   Atrial Fibrillation    4 WEEKS   Hypertension   HPI:    Brandon Mendoza  is a 81 y.o. Caucasian male patient with hypertension, hyperlipidemia, remote stroke with left carotid endarterectomy and follows Dr. Gae Gallop, OSA on CPAP, remote tobacco use disorder, history of gait instability and frequent fall related to burnt out spinal stenosis and peripheral neuropathy, paroxysmal episode of atrial fibrillation noted on Zio patch, longest lasting 21 hours in 2021 incidentally already was noted to have irregular pulse during neurologic evaluation.  He established with me a month ago in January 2023.  He has also been evaluated by Dr. Bishop Limbo, EP at [facility], rate control strategy recommended and no anticoagulation in view of spontaneous muscle hematoma and frequent falls.  He also underwent loop recorder implantation for evaluation of bradycardia and frequent fall on 10/22/2020.     Over the past 18 months he has become very inactive due to significant idiopathic neuropathy of bilateral lower extremity and severe gait instability and frequent falls.  Previous to that he was very active and played racquetball and swimming and other physical activity.  He now presents for f 1 month follow-up, no change in symptoms, no chest pain, dyspnea, has had 1 fall and has mild injury to his nasal bridge.   Past Medical History:  Diagnosis Date   Anxiety    Carotid artery occlusion    COPD (chronic obstructive pulmonary disease) (HCC)    Depression    Dysrhythmia    PAF, atrial tachycardia   Hx of colonic polyps    Hyperlipidemia    Hypertension    Nocturia    Peripheral vascular disease (HCC)    Sleep apnea    on 6 cm, nasal pillow   Stroke ([place]) 05/01/2011   ischemic     Social History   Tobacco Use   Smoking status:  Former    Packs/day: 1.00    Years: 50.00    Pack years: 50.00    Types: Cigarettes    Quit date: 08/26/2004    Years since quitting: 16.7   Smokeless tobacco: Never   Tobacco comments:    quit smoking 08/2004  Substance Use Topics   Alcohol use: Yes    Alcohol/week: 14.0 standard drinks    Types: 14 Shots of liquor per week    Comment: 2-3 daily cocktails   Marital Status: Married  ROS  Review of Systems  Cardiovascular:  Negative for chest pain, dyspnea on exertion and leg swelling.  Gastrointestinal:  Negative for melena.  Neurological:  Positive for focal weakness (legs) and loss of balance.  Objective  Blood pressure (!) 169/85, pulse (!) 54, temperature (!) 97.2 F (36.2 C), temperature source Temporal, resp. rate 17, height $RemoveBe'5\' 9"'lbkdqqQmV$  (1.753 m), weight 189 lb (85.7 kg), SpO2 94 %. Body mass index is 27.91 kg/m.  Vitals with BMI 05/21/2021 05/21/2021 04/23/2021  Height - $Remove'5\' 9"'OZziAIB$  -  Weight - 189 lbs -  BMI - 81.4 -  Systolic 481 856 314  Diastolic 85 79 94  Pulse 54 62 62    Orthostatic VS for the past 72 hrs (Last 3 readings):  Orthostatic BP Patient Position BP Location Cuff Size Orthostatic Pulse  05/21/21 1215 165/80 Standing Left Arm Normal 59  05/21/21 1214 150/76  Sitting Left Arm Normal 54  05/21/21 1213 156/84 Supine Left Arm Normal 53    Physical Exam Neck:     Vascular: No carotid bruit (Left CEA scar noted) or JVD.  Cardiovascular:     Rate and Rhythm: Normal rate and regular rhythm.     Pulses: Intact distal pulses.     Heart sounds: Normal heart sounds. No murmur heard.   No gallop.  Pulmonary:     Effort: Pulmonary effort is normal.     Breath sounds: Normal breath sounds.  Abdominal:     General: Bowel sounds are normal.     Palpations: Abdomen is soft.  Musculoskeletal:        General: No swelling.  Neurological:     Gait: Gait abnormal.     Medications and allergies  No Known Allergies     Medication list after today's encounter   Current  Outpatient Medications:    ALPRAZolam (XANAX) 0.5 MG tablet, Take 0.25 mg by mouth at bedtime., Disp: , Rfl:    Ascorbic Acid (VITAMIN C PO), Take 2 tablets by mouth in the morning., Disp: , Rfl:    diclofenac (VOLTAREN) 75 MG EC tablet, Take 1 tablet by mouth as needed., Disp: , Rfl:    losartan-hydrochlorothiazide (HYZAAR) 50-12.5 MG tablet, Take 1 tablet by mouth every morning., Disp: 30 tablet, Rfl: 2   meclizine (ANTIVERT) 25 MG tablet, Take 25 mg by mouth 2 (two) times daily., Disp: , Rfl:    Melatonin 5 MG CHEW, Chew 5 mg by mouth at bedtime as needed., Disp: , Rfl:    MULTIPLE VITAMIN PO, Take 1 tablet by mouth daily., Disp: , Rfl:    Omega-3 Fatty Acids (FISH OIL) 1000 MG CAPS, Take 1 capsule by mouth daily., Disp: , Rfl:    sertraline (ZOLOFT) 100 MG tablet, Take 1 tablet (100 mg total) by mouth daily., Disp: 15 tablet, Rfl: 0   aspirin (ASPIRIN CHILDRENS) 81 MG chewable tablet, Chew 1 tablet (81 mg total) by mouth daily., Disp: 400 tablet, Rfl: 1   flecainide (TAMBOCOR) 100 MG tablet, Take 1 tablet (100 mg total) by mouth 2 (two) times daily., Disp: 180 tablet, Rfl: 3   rosuvastatin (CRESTOR) 20 MG tablet, Take 1 tablet (20 mg total) by mouth at bedtime., Disp: 90 tablet, Rfl: 3   Laboratory examination:   External labs:   Labs 02/23/2021: Potassium 4.8, BUN 23, creatinine 1.15, EGFR 64 mL.   Total cholesterol 155, triglycerides 69, HDL 62, LDL 81.  TSH normal.  A1c 5.1%.  Hb 14.3/HCT 42.2, platelets 227.  Radiology:   No results found.  Cardiac Studies:   Zio Patch Extended out patient EKG monitoring  June 2021:  Patient had a min HR of 45 bpm, max HR of 203 bpm, and avg HR of 70 bpm. Predominant underlying rhythm was Sinus Rhythm.   There were frequent atrial tachycardia episodes, longest 20.6 seconds. Atrial fibrillation occurred, 16% burden, longest lasting 22 hours and 41 minutes with average heart rate of 80 bpm. Occasional PVCs and PACs.   EKG:   EKG  05/21/2021: Sinus bradycardia with first-degree AV block with rate of 52 bpm, normal axis, no evidence of ischemia, otherwise normal EKG.  Normal QT interval.  No significant change from 04/23/2021.      Assessment     ICD-10-CM   1. AF (paroxysmal atrial fibrillation) (HCC)  I48.0 EKG 12-Lead    flecainide (TAMBOCOR) 100 MG tablet    2. Primary hypertension  I10 losartan-hydrochlorothiazide (HYZAAR) 50-12.5 MG tablet    COMPLETE METABOLIC PANEL WITH GFR    COMPLETE METABOLIC PANEL WITH GFR    3. Pure hypercholesterolemia  E78.00 Lipid Panel With LDL/HDL Ratio    Lipid Panel With LDL/HDL Ratio    rosuvastatin (CRESTOR) 20 MG tablet    DISCONTINUED: rosuvastatin (CRESTOR) 20 MG tablet    DISCONTINUED: rosuvastatin (CRESTOR) 20 MG tablet    4. 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 Caucasian male patient with hypertension, hyperlipidemia, remote stroke with left carotid endarterectomy and follows Dr. 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  He established with me a month ago in January 2023.  He has  also been evaluated by Dr. Bishop Limbo, EP at [facility], rate control strategy recommended and no anticoagulation in view of spontaneous muscle hematoma and frequent falls.  He also underwent loop recorder implantation for evaluation of bradycardia and frequent fall on 10/22/2020.     Patient is orthostasis negative.  Blood pressure is elevated, on his last office visit he was reluctant to making changes, now he wants me to take over his cardiac care.  We will discontinue lisinopril and switch him to losartan HCT 50/12.5 mg in the morning and will obtain BMP in 3 to 4 weeks.  I reviewed his lipids again, LDL is not at goal in view of carotid artery stenosis.  Increase rosuvastatin to 20 mg daily, obtain lipids in 4 weeks.  With regard to atrial fibrillation, he is not on anticoagulation due to extreme high risk of fall, in fact since I saw him last he has had a fall and injured his nose.  However fortunately he has been maintaining sinus rhythm on flecainide, advised him to continue the same for now.  In spite of being 81 years of age, maintaining sinus rhythm and not bradycardic and has not had any side effects from high-dose flecainide.  He continues to remain active.  He would like me to take over the loop recorder monitoring, will request Dr. Mahala Menghini to release the loop to [place].  I will see him back in 2 months for follow-up.  This was again a 40-minute office visit encounter.    Adrian Prows, MD, [facility] 05/21/2021, 12:36 PM Office: [phone] Fax: [phone] Pager: [phone]

## 2021-05-25 ENCOUNTER — Encounter: Payer: Self-pay | Admitting: Cardiology

## 2021-05-27 NOTE — Telephone Encounter (Signed)
Loop transferred to our clinic ?

## 2021-05-28 ENCOUNTER — Other Ambulatory Visit: Payer: Self-pay

## 2021-05-28 DIAGNOSIS — Z860100 Personal history of colon polyps, unspecified: Secondary | ICD-10-CM | POA: Insufficient documentation

## 2021-05-28 DIAGNOSIS — N281 Cyst of kidney, acquired: Secondary | ICD-10-CM | POA: Insufficient documentation

## 2021-05-28 DIAGNOSIS — K5900 Constipation, unspecified: Secondary | ICD-10-CM | POA: Insufficient documentation

## 2021-05-28 DIAGNOSIS — R194 Change in bowel habit: Secondary | ICD-10-CM | POA: Insufficient documentation

## 2021-05-28 DIAGNOSIS — I6522 Occlusion and stenosis of left carotid artery: Secondary | ICD-10-CM

## 2021-05-28 DIAGNOSIS — R197 Diarrhea, unspecified: Secondary | ICD-10-CM | POA: Insufficient documentation

## 2021-05-28 DIAGNOSIS — R42 Dizziness and giddiness: Secondary | ICD-10-CM | POA: Insufficient documentation

## 2021-06-03 NOTE — Telephone Encounter (Signed)
From patient.

## 2021-06-14 NOTE — Progress Notes (Signed)
?Office Note  ? ? ? ?CC:  follow up ?Requesting Provider:  Laurena Bering, DO ? ?HPI: Brandon Mendoza is a 81 y.o. (07-24-1940) male who presents for routine follow up of carotid stenosis. s/p left carotid endarterectomy 12/09/11 by Dr. Edilia Bo for symptomatic carotid stenosis. 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  His speech issues have since resolved. The patient denies symptoms of TIA, amaurosis, or stroke.  He stays active on a daily basis. He does report that this has decreased some since his last visit on account of having trouble with a lot of recent falls. Says this is due to "my knees buckling". This happens after he exerts himself usually. He says he can sort of sense now when it will happen and he will just have to stop and sit down. Says otherwise his legs will just give out. He uses cane now as a precaution when ambulating far distances. He denies any claudication pain, rest pain or tissue loss.  ? ?The pt on a statin for cholesterol management.  ?The pt is on a daily aspirin.   Other AC: none ?The pt is on ARB/HCTZ, CCB for hypertension.   ?The pt is not diabetic.  ?Tobacco hx:  Former smoker, quit 2006 ? ?Past Medical History:  ?Diagnosis Date  ? Anxiety   ? Carotid artery occlusion   ? COPD (chronic obstructive pulmonary disease) (HCC)   ? Depression   ? Dysrhythmia   ? PAF, atrial tachycardia  ? Hx of colonic polyps   ? Hyperlipidemia   ? Hypertension   ? Loop - Medtronic Linq 10/22/2020 10/22/2020  ? Nocturia   ? Peripheral vascular disease (HCC)   ? Sleep apnea   ? on 6 cm, nasal pillow  ? Stroke (HCC) 05/01/2011  ? ischemic  ? ? ?Past Surgical History:  ?Procedure Laterality Date  ? CAROTID ENDARTERECTOMY  12/09/11  ? Left cea  ? ENDARTERECTOMY  12/09/2011  ? Procedure: ENDARTERECTOMY CAROTID;  Surgeon: Larina Earthly, MD;  Location: [facility] OR;  Service: Vascular;  Laterality: Left;  ? EYE SURGERY    ? rt retina detachment  ? HAMMER TOE  SURGERY  2004  ? HERNIA REPAIR  2006  ? SHOULDER ARTHROSCOPY W/ ROTATOR CUFF REPAIR  2010  ? TONSILLECTOMY    ? ? ?Social History  ? ?Socioeconomic History  ? Marital status: Married  ?  Spouse name: [name]  ? Number of children: 4  ? Years of education: College  ? Highest education level: Not on file  ?Occupational History  ? Occupation: Retired  ?Tobacco Use  ? Smoking status: Former  ?  Packs/day: 1.00  ?  Years: 50.00  ?  Pack years: 50.00  ?  Types: Cigarettes  ?  Quit date: 08/26/2004  ?  Years since quitting: 16.8  ?  Passive exposure: Never  ? Smokeless tobacco: Never  ? Tobacco comments:  ?  quit smoking 08/2004  ?Vaping Use  ? Vaping Use: Never used  ?Substance and Sexual Activity  ? Alcohol use: Yes  ?  Alcohol/week: 14.0 standard drinks  ?  Types: 14 Shots of liquor per week  ?  Comment: 2-3 daily cocktails  ? Drug use: No  ? Sexual activity: Not on file  ?Other Topics Concern  ? Not on file  ?Social History Narrative  ? Patient is married with 4 children.  ? Patient is right handed.  ? Patient  has college education.  ? Patient drinks 3 cups daily.  ? ?Social Determinants of Health  ? ?Financial Resource Strain: Not on file  ?Food Insecurity: Not on file  ?Transportation Needs: Not on file  ?Physical Activity: Not on file  ?Stress: Not on file  ?Social Connections: Not on file  ?Intimate Partner Violence: Not on file  ? ? ?Family History  ?Problem Relation Age of Onset  ? Alzheimer's disease Father   ? Diabetes Other   ? ? ?Current Outpatient Medications  ?Medication Sig Dispense Refill  ? ALPRAZolam (XANAX) 0.5 MG tablet Take 0.25 mg by mouth at bedtime.    ? Ascorbic Acid (VITAMIN C PO) Take 2 tablets by mouth in the morning.    ? aspirin (ASPIRIN CHILDRENS) 81 MG chewable tablet Chew 1 tablet (81 mg total) by mouth daily. 400 tablet 1  ? diclofenac (VOLTAREN) 75 MG EC tablet Take 1 tablet by mouth as needed.    ? DULoxetine (CYMBALTA) 30 MG capsule Take 1 capsule by mouth daily.    ? flecainide (TAMBOCOR)  100 MG tablet Take 1 tablet (100 mg total) by mouth 2 (two) times daily. 180 tablet 3  ? losartan-hydrochlorothiazide (HYZAAR) 50-12.5 MG tablet Take 1 tablet by mouth every morning. 30 tablet 2  ? Melatonin 5 MG CHEW Chew 5 mg by mouth at bedtime as needed.    ? MULTIPLE VITAMIN PO Take 1 tablet by mouth daily.    ? Omega-3 Fatty Acids (FISH OIL) 1000 MG CAPS Take 1 capsule by mouth daily.    ? sertraline (ZOLOFT) 100 MG tablet Take 1 tablet (100 mg total) by mouth daily. 15 tablet 0  ? rosuvastatin (CRESTOR) 20 MG tablet Take 1 tablet (20 mg total) by mouth at bedtime. (Patient not taking: Reported on 06/15/2021) 90 tablet 3  ? ?No current facility-administered medications for this visit.  ? ? ?No Known Allergies ? ? ?REVIEW OF SYSTEMS:  ?[X]  denotes positive finding, [ ]  denotes negative finding ?Cardiac  Comments:  ?Chest pain or chest pressure:    ?Shortness of breath upon exertion:    ?Short of breath when lying flat:    ?Irregular heart rhythm:    ?    ?Vascular    ?Pain in calf, thigh, or hip brought on by ambulation:    ?Pain in feet at night that wakes you up from your sleep:     ?Blood clot in your veins:    ?Leg swelling:     ?    ?Pulmonary    ?Oxygen at home:    ?Productive cough:     ?Wheezing:     ?    ?Neurologic    ?Sudden weakness in arms or legs:     ?Sudden numbness in arms or legs:     ?Sudden onset of difficulty speaking or slurred speech:    ?Temporary loss of vision in one eye:     ?Problems with dizziness:     ?    ?Gastrointestinal    ?Blood in stool:     ?Vomited blood:     ?    ?Genitourinary    ?Burning when urinating:     ?Blood in urine:    ?    ?Psychiatric    ?Major depression:     ?    ?Hematologic    ?Bleeding problems:    ?Problems with blood clotting too easily:    ?    ?Skin    ?Rashes or ulcers:    ?    ?  Constitutional    ?Fever or chills:    ? ? ?PHYSICAL EXAMINATION: ? ?Vitals:  ? 06/15/21 1332 06/15/21 1338  ?BP: 114/65 113/70  ?Pulse: 68   ?Resp: 20   ?Temp: 98.2 ?F (36.8  ?C)   ?TempSrc: Temporal   ?SpO2: 96%   ?Weight: 183 lb 14.4 oz (83.4 kg)   ?Height: 5\' 9"  (1.753 m)   ? ? ?General:  WDWN in NAD; vital signs documented above ?Gait: Normal, uses cane ?HENT: WNL, normocephalic ?Pulmonary: normal non-labored breathing , without wheezing ?Cardiac: regular HR, without  Murmurs without carotid bruit ?Abdomen: soft, NT, no masses ?Vascular Exam/Pulses: ? Right Left  ?Radial 2+ (normal) 2+ (normal)  ?Femoral 2+ (normal) 2+ (normal)  ?Popliteal 1+ (weak) 1+ (weak)  ?DP 2+ (normal) 2+ (normal)  ?PT 1+ (weak) 1+ (weak)  ? ?Extremities: without ischemic changes, without Gangrene , without cellulitis; without open wounds;  ?Musculoskeletal: no muscle wasting or atrophy  ?Neurologic: A&O X 3;  No focal weakness or paresthesias are detected ?Psychiatric:  The pt has Normal affect. ? ? ?Non-Invasive Vascular Imaging:   ?VAS Carotid Duplex: ?Summary:  ?Right Carotid: Velocities in the right ICA are consistent with a 1-39% stenosis.  ? ?Left Carotid: Patent carotid endarterectomy with no evidence for restenosis.  ? ?ertebrals:   Bilateral vertebral arteries demonstrate antegrade flow.  ?Subclavians: Normal flow hemodynamics were seen in bilateral subclavian arteries.  ? ? ?ASSESSMENT/PLAN:: 81 y.o. male here for follow up for carotid artery stenosis. ?Duplex today shows 1-39% bilaterally. No stenosis of vertebral or subclavian arteries bilaterally. He has no symptoms related to carotid stenosis. His lower extremity weakness  I do not think is related to any PAD. He has good pulses on physical exam. If anything he may have mild disease which I would suspect would not lead to issues with leg weakness. ?- Encourage him to continue his activity as tolerated ?- continue statin and Plavix ?- He will  follow up in 1 year with carotid duplex ? ?97, PA-C ?[facility] ?[phone] ? ?Clinic MD:   [facility] ?

## 2021-06-15 ENCOUNTER — Ambulatory Visit: Payer: Medicare Other

## 2021-06-15 ENCOUNTER — Ambulatory Visit (INDEPENDENT_AMBULATORY_CARE_PROVIDER_SITE_OTHER): Payer: Medicare Other | Admitting: Physician Assistant

## 2021-06-15 ENCOUNTER — Encounter (HOSPITAL_COMMUNITY): Payer: Medicare Other

## 2021-06-15 ENCOUNTER — Other Ambulatory Visit: Payer: Self-pay

## 2021-06-15 ENCOUNTER — Ambulatory Visit (HOSPITAL_COMMUNITY)
Admission: RE | Admit: 2021-06-15 | Discharge: 2021-06-15 | Disposition: A | Discharge status: Home / Self Care | Payer: Medicare Other | Source: Ambulatory Visit | Attending: Vascular Surgery | Admitting: Vascular Surgery

## 2021-06-15 VITALS — BP 113/70 | HR 68 | Temp 98.2°F | Resp 20 | Ht 69.0 in | Wt 183.9 lb

## 2021-06-15 DIAGNOSIS — I6522 Occlusion and stenosis of left carotid artery: Secondary | ICD-10-CM | POA: Insufficient documentation

## 2021-06-15 DIAGNOSIS — I6523 Occlusion and stenosis of bilateral carotid arteries: Secondary | ICD-10-CM

## 2021-06-15 DIAGNOSIS — Z9889 Other specified postprocedural states: Secondary | ICD-10-CM

## 2021-06-26 ENCOUNTER — Encounter: Payer: Self-pay | Admitting: Neurology

## 2021-06-26 ENCOUNTER — Encounter: Payer: Self-pay | Admitting: Cardiology

## 2021-06-28 NOTE — Telephone Encounter (Signed)
From patient.

## 2021-07-14 ENCOUNTER — Other Ambulatory Visit: Payer: Self-pay

## 2021-07-14 DIAGNOSIS — I10 Essential (primary) hypertension: Secondary | ICD-10-CM

## 2021-07-14 MED ORDER — LOSARTAN POTASSIUM-HCTZ 50-12.5 MG PO TABS: 1.0000 | ORAL_TABLET | Freq: Every morning | ORAL | 2 refills | Status: DC

## 2021-07-16 LAB — LIPID PANEL WITH LDL/HDL RATIO
Cholesterol, Total: 158 mg/dL (ref 100–199)
HDL: 59 mg/dL (ref >39)
LDL Chol Calc (NIH): 85 mg/dL (ref 0–99)
LDL/HDL Ratio: 1.4 ratio (ref 0.0–3.6)
Triglycerides: 72 mg/dL (ref 0–149)
VLDL Cholesterol Cal: 14 mg/dL (ref 5–40)

## 2021-07-19 ENCOUNTER — Ambulatory Visit: Payer: Medicare Other | Admitting: Cardiology

## 2021-07-19 ENCOUNTER — Encounter: Payer: Self-pay | Admitting: Cardiology

## 2021-07-19 VITALS — BP 136/75 | HR 63 | Temp 98.1°F | Resp 16 | Ht 69.0 in | Wt 190.0 lb

## 2021-07-19 DIAGNOSIS — I6523 Occlusion and stenosis of bilateral carotid arteries: Secondary | ICD-10-CM

## 2021-07-19 DIAGNOSIS — E7800 Pure hypercholesterolemia, unspecified: Secondary | ICD-10-CM

## 2021-07-19 DIAGNOSIS — G729 Myopathy, unspecified: Secondary | ICD-10-CM

## 2021-07-19 DIAGNOSIS — I10 Essential (primary) hypertension: Secondary | ICD-10-CM

## 2021-07-19 DIAGNOSIS — I480 Paroxysmal atrial fibrillation: Secondary | ICD-10-CM

## 2021-07-19 NOTE — Progress Notes (Signed)
? ?Primary Physician/Referring:  Kathe Becton, DO ? ?Patient ID: Brandon Mendoza, male    DOB: 05/28/1940, 81 y.o.   MRN: [id number] ? ?Chief Complaint  ?Patient presents with  ? Atrial Fibrillation  ? Hypertension  ? Hyperlipidemia  ? Follow-up  ?  2 months  ? ?HPI:   ? ?DORIAN DUVAL  is a 81 y.o. Caucasian male patient with hypertension, hyperlipidemia, remote stroke with left carotid endarterectomy and follows Dr. 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  He is not on anticoagulation in view of spontaneous muscle hematoma and frequent falls.  He also underwent loop recorder implantation for evaluation of bradycardia and frequent fall on 10/22/2020.      ? ?Over the past 18 months he has become very inactive due to significant idiopathic neuropathy of bilateral lower extremity and severe gait instability and frequent falls.  Previous to that he was very active and played racquetball and swimming and other physical activity. ? ?He now presents for f 1 month follow-up, no change in symptoms, no chest pain, dyspnea, has had 1 fall and has mild injury to his nasal bridge. ? ? ?Past Medical History:  ?Diagnosis Date  ? Anxiety   ? Carotid artery occlusion   ? COPD (chronic obstructive pulmonary disease) ([place])   ? Depression   ? Dysrhythmia   ? PAF, atrial tachycardia  ? Hx of colonic polyps   ? Hyperlipidemia   ? Hypertension   ? Loop - Medtronic Linq 10/22/2020 10/22/2020  ? Nocturia   ? Peripheral vascular disease ([place])   ? Sleep apnea   ? on 6 cm, nasal pillow  ? Stroke ([place]) 05/01/2011  ? ischemic  ? ?  ?Social History  ? ?Tobacco Use  ? Smoking status: Former  ?  Packs/day: 1.00  ?  Years: 50.00  ?  Pack  years: 50.00  ?  Types: Cigarettes  ?  Quit date: 08/26/2004  ?  Years since quitting: 16.9  ?  Passive exposure: Never  ? Smokeless tobacco: Never  ? Tobacco comments:  ?  quit smoking 08/2004  ?Substance Use Topics  ? Alcohol use: Yes  ?  Alcohol/week: 14.0 standard drinks  ?  Types: 14 Shots of liquor per week  ?  Comment: 2-3 daily cocktails  ? ?Marital Status: Married  ?ROS  ?Review of Systems  ?Cardiovascular:  Negative for chest pain, dyspnea on exertion and leg swelling.  ?Gastrointestinal:  Negative for melena.  ?Neurological:  Positive for focal weakness (legs) and loss of balance.  ?Objective  ?Blood pressure 136/75, pulse 63, temperature 98.1 ?F (36.7 ?C), temperature source Temporal, resp. rate 16, height $RemoveBe'5\' 9"'DhNFcOrGm$  (1.753 m), weight 190 lb (86.2 kg), SpO2 96 %. Body mass index is 28.06 kg/m?.  ? ?  07/19/2021  ? 10:21 AM 06/15/2021  ?  1:38 PM 06/15/2021  ?  1:32 PM  ?Vitals with BMI  ?Height $Remove'5\' 9"'caSDQiY$   '5\' 9"'$   ?Weight 190 lbs  183 lbs 14 oz  ?BMI 28.05  27.14  ?Systolic 818 299 371  ?Diastolic 75 70 65  ?Pulse 63  68  ?  ?Orthostatic VS for the past 72 hrs (Last 3 readings): ? Patient Position BP Location Cuff Size  ?  07/19/21 1021 Sitting Left Arm Normal  ?  ?Physical Exam ?Neck:  ?   Vascular: No carotid bruit (Left CEA scar noted) or JVD.  ?Cardiovascular:  ?   Rate and Rhythm: Normal rate and regular rhythm.  ?   Pulses: Intact distal pulses.  ?   Heart sounds: Normal heart sounds. No murmur heard. ?  No gallop.  ?Pulmonary:  ?   Effort: Pulmonary effort is normal.  ?   Breath sounds: Normal breath sounds.  ?Abdominal:  ?   General: Bowel sounds are normal.  ?   Palpations: Abdomen is soft.  ?Musculoskeletal:     ?   General: No swelling.  ?Neurological:  ?   Gait: Gait abnormal.  ?  ? ?Medications and allergies  ?No Known Allergies  ?  ? ?Medication list after today's encounter  ? ?Current Outpatient Medications:  ?  ALPRAZolam (XANAX) 0.5 MG tablet, Take 0.25 mg by mouth at bedtime., Disp: , Rfl:  ?  Ascorbic  Acid (VITAMIN C PO), Take 2 tablets by mouth in the morning., Disp: , Rfl:  ?  aspirin (ASPIRIN CHILDRENS) 81 MG chewable tablet, Chew 1 tablet (81 mg total) by mouth daily., Disp: 400 tablet, Rfl: 1 ?  diclofenac (VOLTAREN) 75 MG EC tablet, Take 1 tablet by mouth as needed., Disp: , Rfl:  ?  DULoxetine (CYMBALTA) 30 MG capsule, Take 1 capsule by mouth daily., Disp: , Rfl:  ?  losartan-hydrochlorothiazide (HYZAAR) 50-12.5 MG tablet, Take 1 tablet by mouth every morning., Disp: 90 tablet, Rfl: 2 ?  Melatonin 5 MG CHEW, Chew 5 mg by mouth at bedtime as needed., Disp: , Rfl:  ?  MULTIPLE VITAMIN PO, Take 1 tablet by mouth daily., Disp: , Rfl:  ?  Omega-3 Fatty Acids (FISH OIL) 1000 MG CAPS, Take 1 capsule by mouth daily., Disp: , Rfl:  ?  sertraline (ZOLOFT) 100 MG tablet, Take 1 tablet (100 mg total) by mouth daily., Disp: 15 tablet, Rfl: 0 ?  [START ON 08/23/2021] rosuvastatin (CRESTOR) 20 MG tablet, Take 0.5 tablets (10 mg total) by mouth at bedtime., Disp: 90 tablet, Rfl: 3  ? ?Laboratory examination:  ? ?External labs:  ? ?Labs 02/23/2021: Potassium 4.8, BUN 23, creatinine 1.15, EGFR 64 mL.  ? ?Total cholesterol 155, triglycerides 69, HDL 62, LDL 81. ? ?TSH normal.  A1c 5.1%. ? ?Hb 14.3/HCT 42.2, platelets 227. ? ?Radiology:  ? ?No results found. ? ?Cardiac Studies:  ? ?Echocardiogram 11/30/2011: ?Normal LV systolic function, EF 60 to 65%.  No significant valvular abnormality.  Normal left atrial size. ? ?Carotid artery duplex 06/15/2021: ?Minimal stenosis in the right carotid artery of 1 to 39%, left carotid endarterectomy site widely patent.  Antegrade vertebral artery flow.  Normal flow dynamics in bilateral subclavian arteries. ? ?Remote loop recorder transmission 06/25/2021: Predominant rhythm is normal sinus rhythm.  There was 1 NSVT episode for 17 seconds EKG = artifact.  There was no asystole.  No symptoms reported. ? ?EKG:  ? ?EKG 07/19/2021: Normal sinus rhythm at rate of 62 bpm, normal axis, incomplete right  bundle branch block.  PACs (2.  ? ?EKG 05/21/2021: Sinus bradycardia with first-degree AV block with rate of 52 bpm, normal axis, no evidence of ischemia, otherwise normal EKG.  Normal QT interval.  No significant change from 04/23/2021.     ? ?Assessment  ? ?  ICD-10-CM   ?1. AF (paroxysmal atrial fibrillation) (HCC)  I48.0 EKG 12-Lead  ?  ?2. Primary hypertension  I10   ?  ?  3. Pure hypercholesterolemia  E78.00 rosuvastatin (CRESTOR) 20 MG tablet  ?  ?4. Bilateral carotid artery stenosis S/P Left CEA  I65.23   ?  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  ?  ? ?Orders Placed This Encounter  ?Procedures  ? Ambulatory referral to Neurology  ?  Referral Priority:   Routine  ?  Referral Type:   Consultation  ?  Referral Reason:   Specialty Services Required  ?  Referred to Provider:   Larey Seat, MD  ?  Requested Specialty:   Neurology  ?  Number of Visits Requested:   1  ? EKG 12-Lead  ? ?Recommendations:  ? ?ZAKKERY DORIAN is a 81 y.o. Caucasian male patient with hypertension, hyperlipidemia, remote stroke with left carotid endarterectomy and follows Dr. 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  He is not on anticoagulation in view of spontaneous muscle hematoma and frequent falls.  He also underwent loop recorder implantation for evaluation of bradycardia and frequent fall on 10/22/2020.    ? ?His main issue continues to be weakness in his lower extremities and muscle weakness.  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  I suspect this is some form of myopathy that is led to rapid deterioration in overall quality of life and functional capacity.  Agree with holding  anticoagulation, he has not had any further episodes of atrial fibrillation on the loop recorder evaluation. ? ?Blood pressure is now improved since being on losartan.  Continue the same. ? ?With regard

## 2021-07-22 ENCOUNTER — Telehealth: Payer: Self-pay | Admitting: Neurology

## 2021-07-22 ENCOUNTER — Ambulatory Visit: Payer: Medicare Other | Admitting: Family Medicine

## 2021-07-22 ENCOUNTER — Ambulatory Visit (INDEPENDENT_AMBULATORY_CARE_PROVIDER_SITE_OTHER): Payer: Medicare Other | Admitting: Neurology

## 2021-07-22 ENCOUNTER — Encounter: Payer: Self-pay | Admitting: Neurology

## 2021-07-22 VITALS — BP 151/74 | HR 49 | Ht 69.0 in | Wt 187.0 lb

## 2021-07-22 DIAGNOSIS — M2041 Other hammer toe(s) (acquired), right foot: Secondary | ICD-10-CM | POA: Diagnosis not present

## 2021-07-22 DIAGNOSIS — M2042 Other hammer toe(s) (acquired), left foot: Secondary | ICD-10-CM

## 2021-07-22 DIAGNOSIS — M48061 Spinal stenosis, lumbar region without neurogenic claudication: Secondary | ICD-10-CM

## 2021-07-22 DIAGNOSIS — Q667 Congenital pes cavus, unspecified foot: Secondary | ICD-10-CM | POA: Insufficient documentation

## 2021-07-22 DIAGNOSIS — G629 Polyneuropathy, unspecified: Secondary | ICD-10-CM | POA: Insufficient documentation

## 2021-07-22 DIAGNOSIS — G64 Other disorders of peripheral nervous system: Secondary | ICD-10-CM

## 2021-07-22 DIAGNOSIS — I6523 Occlusion and stenosis of bilateral carotid arteries: Secondary | ICD-10-CM

## 2021-07-22 DIAGNOSIS — R269 Unspecified abnormalities of gait and mobility: Secondary | ICD-10-CM

## 2021-07-22 NOTE — Telephone Encounter (Signed)
Medicare/[employer] NPR sent to GI they will reach out to patient to schedule. [id number] ?

## 2021-07-22 NOTE — Patient Instructions (Addendum)
Alpha lipoic acid supplementation. ? ?NCV and EMG- needs CMT evaluation  ? ?Continue PT  ? ?

## 2021-07-22 NOTE — Addendum Note (Signed)
Addended by: Melvyn Novas on: 07/22/2021 11:39 AM ? ? Modules accepted: Orders ? ?

## 2021-07-22 NOTE — Progress Notes (Addendum)
Quick Note: ? ?AHI of 4.3, 100% compliance at 6 cm water pressure CPAP with 2 cm EPR.  ? 6 hours and 21 minutes nightly user time .  ? ? ?SLEEP MEDICINE CLINIC ? ? ?Provider:  Melvyn Novasarmen  Milanna Kozlov, MD  ? ?Primary Care Physician:  Dr. Severiano GilbertAli AKBARIY ? ?Referring Provider:  Dr Delia HeadyPramod Sethi, MD  ? ? ?Chief Complaint  ?Patient presents with  ? Follow-up  ? Obstructive Sleep Apnea  ?  Rm 10, alone. 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 Pt reports doing well on CPAP. Pt states 2 years ago pt was active and started to have falls. Having muscle weakness in legs and knees.   ? ? ?HPI:  Genelle BalJohn H Stargell is a 81 y.o. male patient , originally referred by Dr Pearlean BrownieSethi 2014 after a stroke and followed in the sleep clinic for OSA on CPAP. Followed also by Shawnie DapperAmy Lomax, NP. ?He has his primary care in [facility]. He has changed his cardio care to Dr Webb SilversmithGanji,MD. ?Interval Visit and history : 07-22-2021: ?The patient is worried about his neuropathy, and he has undergone some UV light infrared light stimulation therapy. PT is working with him. He has right sided weakness, high arches and hammertoes, question of Charcot marie tooth.  ?He had nerve tests with Dr Maple HudsonMoser.  Dr. Maple HudsonMoser did nerve conduction and EMG testing, he also underwent spinal tap with CSF testing and nothing showed up abnormal Dr. Maple HudsonMoser did state that the patient had neuropathy but he could not identify a cause the patient is not diabetic, he has ptosis. Was checked for myasthenia gravis- negative. He cant stand on one leg, no tandem gait ability, and he falls frequently. ?I have no NCV capacity in our office for now- that's what is needed.  ? ? ?Dr Jacinto HalimGanji has recently seen him, he Dr. Jacinto HalimGanji wanted me to reevaluate this patient for muscle disorder but I do not have the capacity to do these tests here.  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   He had no further episodes of atrial fibrillation on the loop recorder therefore the anticoagulation could be discontinued I am glad of that.  The question of Charcot-Marie-Tooth syndrome is justified however I have no ability to provide a nerve conduction with EMG at this time I will ask my colleague Dr. Terrace ArabiaYan to or Dr. Daisy BlossomAhearn to recommend where this test would best be done.   ? ?In addition the patient had just been seen on 4-24 by Dr. Jacinto HalimGanji and he had followed with Dr. Woodfin Ganjahris Dixon. ? ? I treated him in the sleep clinic for OSA and he is on CPAP and compliant.  100% compliance for the last 30 days AutoSet is used between 5 and 10 cm water pressure with 2 cm expiratory pressure relief.  Residual AHI for this patient is 1.7 which is excellent.  Set up for this machine was 03-2018. ? ? ? ? ?So I have the pleasure of meeting this gentleman now after many years of not being actively involved in his care.  His orthopedist Dr. Venita Lickahari Brooks had referred him to my colleague Dr. Daisy BlossomAhearn.  Today's concern is about frequent falling and neuropathy -apparently balance issues. ?The patient underwent cardiac monitoring in 2021, he was diagnosed with atrial fibrillation by his [facility] cardiology office and then started on Eliquis as anticoagulant. ?He then  underwent tilt table testing with cardiology and a loop recorder was finally implanted.  He has not had any messages about abnormal heart rhythm since. ?While on Eliquis he suffered several falls and this is one of his big problems he has used which hematoma and he showed me the pictures to prove it both knees have been scrapped and scabbed. ?He was then referred to a neurologist in [facility], Dr. Maple Hudson at [facility].  Dr. Maple Hudson did nerve conduction and EMG testing, he also underwent spinal tap with CSF testing and nothing showed up abnormal Dr. Maple Hudson did state that the patient had neuropathy but he could not identify a cause the patient is not diabetic, he is not  actively smoking.  He did not have any vitamin deficiencies. H the patient continues to fall and broke his foot. Dr [name] treated his foot.  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  He is twice a day involved with the ultrasound stimulation of his lower extremity. ?He wants no longer to see Dr Maple Hudson nor the [facility].  ?He wanted to see Dr Lucia Gaskins, as recommended by Dr.Brooks.   ? ?As to his apnea - he has no concerns , was calling just yesterday- and got this appointment with completely different set of problem.  ?Dr [name] referral - He had an L4-5 decompression with level of interspinous process expander and instrumented fusion. Overall he is beginning to make improvements. We will stop his narcotic medications, muscle relaxers, and gabapentin. He will start diclofenac and Tylenol for discomfort. The wound was healing well.  He is now pain free-  ?Last seen by me in [facility] on 03-22-2018 for CPAP compliance but did not bring his CPAP nor the memory chip: He has 3 times nocturia and still snores, and he wants to watch TV in his bedroom- his wife has moved to another room. He has several Martinis each night and takes Xanax (!).  ?Mr. JURIEL CID is a meanwhile 81 year old Caucasian right-handed gentleman with a history of stroke and aftercare through [name] and later through [name].  He has been on his CPAP for about 6 years now.  I do not have the compliance data available but he will bring them by today or tomorrow.  A discussion is if he needs a new machine he is entitled to 1, I would like for him to have the best possible and interrupted apnea care and is my experience that the machines are set with a software that makes him obsolete  after 6 to 7 years anyway. ?He will undergo HST but is not willing to undergo PSG. He understands this will likely lead to a autotitration CPAP, nasal pillows. ? ? ?He was seen here as in a referral  from [name] in 2017. He is an establsished stroke patient , who had undergone a sleep study in 2013 and has been using his CPAP - Compliantly since. He had no follow up in the sleep clinic in the last 3 years and has no recollection of seeing me before. ?I have a note here from my last visit with Mr. Arenson from 14 May 2012 the patient was at the time followed by 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.  Carotid Doppler showed a left ICA stenosis 60-80%, he was briefly hospitalized with very high systolic blood pressures but did well afterwards he was tolerating aspirin and Plavix.  I was able to follow up with a sleep study on 08 March 2012 and the patient was tested positive for sleep apnea, his apnea was strictly obstructive in origin but to a high degree his AHI was 59.1, REM AHI was under 2.1, he slept all night supine.  Nadir of oxygen saturation was at 86% with 19 minutes of desaturation time totally.  No PLMs.  ?He was titrated to CPAP at a very low pressure of only 6 cmH2O which alleviated his apnea completely.  He has continued to use his CPAP compliantly ever since and recently tried to get new supplies but he was told that he has to follow up with me first.  Due to the new Medicare requirements he will now be seen once a year.  I was able to get a compliance report on his machine which is Wi-Fi compatible.  He is 100% compliant for the last 30 days with 83 compliance 4 hours.  His average use of time is 5 hours and 1 minute at night. ?He feels that the CPAP helps him sleep, he still has 2 times nocturia each night and often  does not put the mask back on after his bathroom break therefore the reduced number of hours.  He sleeps at least 7 hours nightly.  His residual AHI is 2.6 on a setting of 6 cm water pressure with 2

## 2021-07-22 NOTE — Addendum Note (Signed)
Addended by: Melvyn Novas on: 07/22/2021 12:46 PM ? ? Modules accepted: Orders ? ?

## 2021-07-23 ENCOUNTER — Encounter: Payer: Self-pay | Admitting: Neurology

## 2021-07-26 ENCOUNTER — Ambulatory Visit
Admission: RE | Admit: 2021-07-26 | Discharge: 2021-07-26 | Disposition: A | Discharge status: Home / Self Care | Payer: Medicare Other | Source: Ambulatory Visit | Attending: Neurology | Admitting: Neurology

## 2021-07-26 DIAGNOSIS — R269 Unspecified abnormalities of gait and mobility: Secondary | ICD-10-CM

## 2021-07-26 DIAGNOSIS — M48061 Spinal stenosis, lumbar region without neurogenic claudication: Secondary | ICD-10-CM

## 2021-07-26 DIAGNOSIS — G64 Other disorders of peripheral nervous system: Secondary | ICD-10-CM

## 2021-07-26 DIAGNOSIS — G629 Polyneuropathy, unspecified: Secondary | ICD-10-CM

## 2021-07-26 DIAGNOSIS — M2041 Other hammer toe(s) (acquired), right foot: Secondary | ICD-10-CM

## 2021-07-26 DIAGNOSIS — Q667 Congenital pes cavus, unspecified foot: Secondary | ICD-10-CM

## 2021-07-27 ENCOUNTER — Encounter: Payer: Self-pay | Admitting: Neurology

## 2021-07-28 ENCOUNTER — Telehealth: Payer: Self-pay | Admitting: Neurology

## 2021-07-28 ENCOUNTER — Ambulatory Visit: Payer: Medicare Other | Admitting: Family Medicine

## 2021-07-28 NOTE — Telephone Encounter (Signed)
NCV/EMG order sent to [facility] [phone]. ?

## 2021-08-15 ENCOUNTER — Other Ambulatory Visit: Payer: Self-pay | Admitting: Cardiology

## 2021-08-15 DIAGNOSIS — I10 Essential (primary) hypertension: Secondary | ICD-10-CM

## 2021-08-17 ENCOUNTER — Telehealth: Payer: Self-pay

## 2021-08-17 ENCOUNTER — Other Ambulatory Visit: Payer: Self-pay

## 2021-08-17 DIAGNOSIS — I10 Essential (primary) hypertension: Secondary | ICD-10-CM

## 2021-08-17 MED ORDER — LOSARTAN POTASSIUM-HCTZ 50-12.5 MG PO TABS: 1.0000 | ORAL_TABLET | Freq: Every morning | ORAL | 3 refills | Status: DC

## 2021-08-17 NOTE — Telephone Encounter (Signed)
Pt called to inform us that he has stopped the flecainide for the past 4-5 weeks. Pt stopped the rosuvastatin always. Pt would like to know do you want him to continue it due that he is still having muscle weakness.

## 2021-08-17 NOTE — Telephone Encounter (Signed)
Yes. He should resume his meds. As his symptoms are not related to cardiac medications

## 2021-08-17 NOTE — Telephone Encounter (Signed)
Called pt to inform him about the message above. Pt understood

## 2021-08-18 ENCOUNTER — Ambulatory Visit: Payer: Medicare Other | Admitting: Family Medicine

## 2021-09-08 ENCOUNTER — Encounter: Payer: Self-pay | Admitting: Neurology

## 2021-09-10 NOTE — Telephone Encounter (Signed)
I have read and compared the report for MRI  and NCV and EMG;   Brandon Mendoza was evaluated in a MRI of the lumbar spine with and without contrast for recurrent stenosis of the lumbar spine.  He had undergone surgery on 29 February 2022 but now has lower back pain again he also reports bilateral leg weakness.  The MRI showed the typical postsurgical changes at L4 and L5 where he underwent decompressive intervention with pedicle screws.  Incidental hemangioma at L1 and T12 was described.  Bilateral cystic changes in the kidneys.  There is progression on 3 levels severe disc narrowing causing mild left and moderate right foraminal stenosis between L1 and L2.  Progression at L2-L3 biforaminal extension of disc bulge mild central and mild to moderate by foraminal stenosis.  At L3-L4 progression moderate severe arthropathy moderate severe by foraminal stenosis and on both sides the L3 nerve root seems to be touched by bone spurs but there is no clear evidence of compression.  Also right-sided greater than left-sided L4 canal contact between disc and cord.  Improvement is seen at L4-5 the location of the previous surgery.  There is still mild central stenosis noted and mild to moderate by foraminal stenosis is present.  At L5-S1 there is no change to the previous imaging study mild central canal stenosis moderate left and mild right lateral recess stenosis the conus is in normal position and adherence.  Exam date was 6-8 23.  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   There is no general peripheral neuropathy and no entrapment at the peroneal motor nerve was found .   This does not explain the buckling of his knees,but the feeling of weakness.    Symptoms of buttock pain may correlate with the advancing lumbar spinal stenosis.  Any further surgical treatment was not  commented upon.

## 2021-09-14 DIAGNOSIS — M25551 Pain in right hip: Secondary | ICD-10-CM | POA: Insufficient documentation

## 2021-10-21 ENCOUNTER — Encounter: Payer: Self-pay | Admitting: Internal Medicine

## 2021-10-21 ENCOUNTER — Ambulatory Visit (INDEPENDENT_AMBULATORY_CARE_PROVIDER_SITE_OTHER): Payer: Medicare Other | Admitting: Internal Medicine

## 2021-10-21 VITALS — BP 158/78 | HR 58 | Temp 97.8°F | Ht 68.0 in | Wt 179.8 lb

## 2021-10-21 DIAGNOSIS — I10 Essential (primary) hypertension: Secondary | ICD-10-CM

## 2021-10-21 DIAGNOSIS — R296 Repeated falls: Secondary | ICD-10-CM | POA: Diagnosis not present

## 2021-10-21 DIAGNOSIS — D6869 Other thrombophilia: Secondary | ICD-10-CM

## 2021-10-21 DIAGNOSIS — M40209 Unspecified kyphosis, site unspecified: Secondary | ICD-10-CM | POA: Insufficient documentation

## 2021-10-21 DIAGNOSIS — M48061 Spinal stenosis, lumbar region without neurogenic claudication: Secondary | ICD-10-CM

## 2021-10-21 DIAGNOSIS — F325 Major depressive disorder, single episode, in full remission: Secondary | ICD-10-CM

## 2021-10-21 DIAGNOSIS — Z8673 Personal history of transient ischemic attack (TIA), and cerebral infarction without residual deficits: Secondary | ICD-10-CM

## 2021-10-21 DIAGNOSIS — N183 Chronic kidney disease, stage 3 unspecified: Secondary | ICD-10-CM

## 2021-10-21 DIAGNOSIS — I63139 Cerebral infarction due to embolism of unspecified carotid artery: Secondary | ICD-10-CM

## 2021-10-21 DIAGNOSIS — I739 Peripheral vascular disease, unspecified: Secondary | ICD-10-CM | POA: Diagnosis not present

## 2021-10-21 DIAGNOSIS — M40292 Other kyphosis, cervical region: Secondary | ICD-10-CM

## 2021-10-21 DIAGNOSIS — N281 Cyst of kidney, acquired: Secondary | ICD-10-CM

## 2021-10-21 DIAGNOSIS — E871 Hypo-osmolality and hyponatremia: Secondary | ICD-10-CM

## 2021-10-21 DIAGNOSIS — K5900 Constipation, unspecified: Secondary | ICD-10-CM

## 2021-10-21 DIAGNOSIS — R29818 Other symptoms and signs involving the nervous system: Secondary | ICD-10-CM | POA: Insufficient documentation

## 2021-10-21 DIAGNOSIS — I480 Paroxysmal atrial fibrillation: Secondary | ICD-10-CM

## 2021-10-21 DIAGNOSIS — R413 Other amnesia: Secondary | ICD-10-CM | POA: Insufficient documentation

## 2021-10-21 DIAGNOSIS — I129 Hypertensive chronic kidney disease with stage 1 through stage 4 chronic kidney disease, or unspecified chronic kidney disease: Secondary | ICD-10-CM

## 2021-10-21 DIAGNOSIS — M62569 Muscle wasting and atrophy, not elsewhere classified, unspecified lower leg: Secondary | ICD-10-CM

## 2021-10-21 DIAGNOSIS — I6523 Occlusion and stenosis of bilateral carotid arteries: Secondary | ICD-10-CM

## 2021-10-21 DIAGNOSIS — F3341 Major depressive disorder, recurrent, in partial remission: Secondary | ICD-10-CM

## 2021-10-21 DIAGNOSIS — R531 Weakness: Secondary | ICD-10-CM | POA: Insufficient documentation

## 2021-10-21 DIAGNOSIS — I6522 Occlusion and stenosis of left carotid artery: Secondary | ICD-10-CM

## 2021-10-21 DIAGNOSIS — F1090 Alcohol use, unspecified, uncomplicated: Secondary | ICD-10-CM

## 2021-10-21 HISTORY — DX: Unspecified kyphosis, site unspecified: M40.209

## 2021-10-21 HISTORY — DX: Weakness: R53.1

## 2021-10-21 HISTORY — DX: Muscle wasting and atrophy, not elsewhere classified, unspecified lower leg: M62.569

## 2021-10-21 HISTORY — DX: Other symptoms and signs involving the nervous system: R29.818

## 2021-10-21 NOTE — Assessment & Plan Note (Signed)
Will follow up on emg/MRI/L spine eval Continue physical therapy for now Continue follow up with [facility] I believe muscle atrophy is the primary cause of his recurring, life threatening falls.   Off blood thinners to aspirin.

## 2021-10-21 NOTE — Assessment & Plan Note (Signed)
Uncontrolled but previously controlled and this is a stressful visit, he has high fall risk, high risk of hypotensive kidney injury, elderly frail state, so will have him rtc in 1 month and recheck and if stilll elevated will adjust meds. Continue relevant medications

## 2021-10-21 NOTE — Assessment & Plan Note (Signed)
Stable today Continue relevant medicines

## 2021-10-21 NOTE — Progress Notes (Signed)
Phone: [phone]  New patient visit  Visit Date: 10/21/2021 Patient: Brandon Mendoza   DOB: 02-21-1941   81 y.o. Male  MRN: [id number]   Assessment and Plan:   As a new patient to me, I reviewed and updated: Allergies: Review Complete (You) Medications: Reviewed (You) Problem List: Reviewed (You) Medical History: Reviewed (You) Surgical History: Reviewed (You) Tobacco History: Reviewed (You) Family History: Reviewed (You)  I worked with the patient and the computer to update and address key items from the problem list  as follows:  Problem List Items Addressed This Visit       Active Problems   Cerebral infarction (HCC)   Carotid artery stenosis, symptomatic   Relevant Medications   aspirin 81 MG chewable tablet   flecainide (TAMBOCOR) 100 MG tablet   Habitual alcohol use   Essential hypertension    Stable today Continue relevant medicines      Relevant Medications   aspirin 81 MG chewable tablet   flecainide (TAMBOCOR) 100 MG tablet   AF (paroxysmal atrial fibrillation) (HCC)    Continue flecainide Continue cardiology follow up Limit anticoag to asa only due to falls.       Relevant Medications   aspirin 81 MG chewable tablet   flecainide (TAMBOCOR) 100 MG tablet   Hyponatremia   Spinal stenosis of lumbar region    Requested records, he feels that pain well controlled and that leg atrophy is unrelated.      Acquired thrombophilia (HCC)   Benign hypertension with 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)    Uncontrolled but previously controlled and this is a stressful visit, he has high fall risk, high risk of hypotensive kidney injury, elderly frail state, so will have him rtc in 1 month and recheck and if stilll elevated will adjust meds. Continue relevant medications      Relevant Medications   aspirin 81 MG chewable tablet   flecainide (TAMBOCOR) 100 MG tablet   Constipation    Stable Continue otc miralax prn      Recurrent major depression  (HCC)    Wean xanax Continue cymbalta and zoloft, but consider reducing at next visit.      Relevant Medications   ALPRAZolam (XANAX) 0.25 MG tablet   Recurrent falls - Primary   Renal cyst   Claudication of lower extremity (HCC)   Relevant Medications   aspirin 81 MG chewable tablet   Atrophy of calf muscles    Will follow up on emg/MRI/L spine eval Continue physical therapy for now Continue follow up with [facility] I believe muscle atrophy is the primary cause of his recurring, life threatening falls.   Off blood thinners to aspirin.       Weakness generalized    [facility] We discussed possible etiology      Focal neurological deficit    Patient wouldclike mri as drueling has been worsening.       Kyphosis   Other Visit Diagnoses     Depression, major, in remission (HCC)   (Chronic)     Relevant Medications   ALPRAZolam (XANAX) 0.25 MG tablet   History of stroke       Memory impairment             Health Maintenance  Topic Date Due   Zoster Vaccines- Shingrix (1 of 2) Never done   TETANUS/TDAP  07/04/2017   INFLUENZA VACCINE  10/26/2021   Pneumonia Vaccine 38+ Years old  Completed   COVID-19 Vaccine  Completed   HPV VACCINES  Aged Out       Recommended follow up: Return in about 1 month (around 11/21/2021). Future Appointments  Date Time Provider [address]  11/22/2021  9:00 AM Loralee Pacas, MD [facility]  11/23/2021 10:30 AM Debbora Presto, NP [facility] None  01/03/2022  8:30 AM Adrian Prows, MD [facility] None    Subjective:  Patient presents today to establish care.  Prior patient of retired Dr. Sallyanne Havers.  Then had Dr. Belva Chimes for 1 year Then moved to Dr. Novella Rob.  Then a friend suggested this office.      Chief Complaint  Patient presents with   Establish Care    Pt here to est care with new pcp. Pt stated that he would like to address his fall history that has been going on for the past 106yrs.     See problem oriented charting in assessment and plan- elements of patient reported history are also recorded there.      Depression Screen    10/21/2021    8:50 AM  PHQ 2/9 Scores  PHQ - 2 Score 2  PHQ- 9 Score 4   No results found for any visits on 10/21/21.   The following were reviewed and entered/updated in epic: Past Medical History:  Diagnosis Date   Anxiety    Atrophy of calf muscles 10/21/2021   Duplex calves was negative for significant blockage EMG was done at [facility] MRI lumbar spine was also done at [facility]   Carotid artery occlusion    COPD (chronic obstructive pulmonary disease) (HCC)    Depression    Dysrhythmia    PAF, atrial tachycardia   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   The spinal cord appears normal. 2.   No spinal stenosis. 3.   Mild multilevel degenerative changes as detailed above that do not lead to spinal stenosis or nerve root compression. 4.   T2 hyperintense foci within the pons consistent with chronic microvascular ischemic changes.   Loop - Medtronic Linq 10/22/2020 10/22/2020   Nocturia    Peripheral vascular disease (HCC)    Sleep apnea    on 6 cm, nasal pillow   Stroke ([place]) 05/01/2011   ischemic   Weakness generalized 10/21/2021   Severe, legs and arms   Past Surgical History:  Procedure Laterality Date   CAROTID ENDARTERECTOMY  12/09/11   Left cea   ENDARTERECTOMY  12/09/2011   Procedure: ENDARTERECTOMY CAROTID;  Surgeon: Rosetta Posner, MD;  Location: [facility];  Service: Vascular;  Laterality: Left;   EYE SURGERY     rt retina detachment   HAMMER TOE SURGERY  2004   HERNIA REPAIR  2006   SHOULDER ARTHROSCOPY W/ ROTATOR CUFF REPAIR  2010   TONSILLECTOMY       I have reviewed the patient's medical  history in detail and updated the computerized patient record.   Patient Active Problem List   Diagnosis Date Noted   Claudication of lower extremity ([place]) 10/21/2021   Atrophy of calf muscles 10/21/2021   Weakness generalized 10/21/2021   Focal neurological deficit 10/21/2021   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   High arches 07/22/2021  Hammertoes of both feet 07/22/2021   Neuropathy 07/22/2021   Constipation 05/28/2021   Disequilibrium 05/28/2021   Personal history of colonic polyps 05/28/2021   Renal cyst 05/28/2021   Gastroesophageal reflux disease without esophagitis 05/05/2021   Ingrowing left great toenail 02/15/2021   S/P lumbar fusion 12/24/2020   Spinal stenosis of lumbar region 12/04/2020   Acquired complex renal cyst 11/23/2020   Functional gait abnormality 11/04/2020   Idiopathic neuropathy 11/02/2020   Tinea pedis of both feet 10/28/2020   Loop - Medtronic Linq 10/22/2020 10/22/2020   Benign hypertension with 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) 10/05/2020   Chronic bilateral low back pain with bilateral sciatica 10/05/2020   Fall with injury 10/05/2020   Hematoma 09/16/2020   Normocytic anemia 09/14/2020   AF (paroxysmal atrial fibrillation) (HCC) 09/14/2020   Chronic anticoagulation 09/14/2020   Hyponatremia 09/14/2020   Pain in left foot 01/22/2020   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   Pain in joint of right knee 12/27/2019   Recurrent falls 11/17/2019   Paroxysmal atrial fibrillation (HCC) 10/22/2019   Pain in right foot 10/14/2019   Falls 10/07/2019   Ataxia involving legs 08/12/2019   OSA on CPAP 06/20/2018   Urge incontinence 04/13/2018   Urinary urgency 04/13/2018   Pain in left knee 03/01/2018   Other intervertebral disc degeneration, lumbar region 06/16/2017   Acquired bilateral hammer toes 10/05/2015   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   Generalized anxiety disorder 05/12/2015   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HCC)  05/10/2015   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   History of left-sided carotid endarterectomy 04/10/2014   Essential hypertension 04/10/2014   Occlusion and stenosis of carotid artery without mention of cerebral infarction 12/15/2011   Habitual alcohol use 12/01/2011   Carotid artery stenosis, symptomatic 11/30/2011   Cerebral infarction (HCC) 11/29/2011   Hyperlipidemia    Past Surgical History:  Procedure Laterality Date   CAROTID ENDARTERECTOMY  12/09/11   Left cea   ENDARTERECTOMY  12/09/2011   Procedure: ENDARTERECTOMY CAROTID;  Surgeon: Larina Earthly, MD;  Location: [facility];  Service: Vascular;  Laterality: Left;   EYE SURGERY     rt retina detachment   HAMMER TOE SURGERY  2004   HERNIA REPAIR  2006   SHOULDER ARTHROSCOPY W/ ROTATOR CUFF REPAIR  2010   TONSILLECTOMY     Family Status  Relation Name Status   Mother  Deceased at age 1       fall;head trauma   Father  Deceased at age 81       fall; head trauma   Sister  Nature conservation officer   Other  (Not Specified)   Family History  Problem Relation Age of Onset   Alzheimer's disease Father    Diabetes Other     Medications- reviewed and updated I attest that I have reviewed and confirmed the patients current medications to meet the medication reconciliation requirement  Current Outpatient Medications  Medication Sig Dispense Refill   ALPRAZolam (XANAX) 0.25 MG tablet Take 1 tablet 3 times a day by oral route.     diclofenac (VOLTAREN) 75 MG EC tablet Take 1 tablet by mouth as needed.     DULoxetine (CYMBALTA) 30 MG capsule Take 1 capsule by mouth daily.     losartan-hydrochlorothiazide (HYZAAR) 50-12.5 MG tablet Take 1 tablet by mouth every morning. 90 tablet 3   Melatonin 5 MG CHEW Chew 5 mg by mouth at  bedtime as needed.     MULTIPLE VITAMIN PO Take 1 tablet by mouth daily.     rosuvastatin (CRESTOR) 20 MG tablet Take 10 mg by mouth at bedtime. 90 tablet 3   sertraline  (ZOLOFT) 100 MG tablet Take 1 tablet (100 mg total) by mouth daily. 15 tablet 0   Ascorbic Acid (VITAMIN C) 1000 MG tablet Take by mouth.     aspirin 81 MG chewable tablet Chew 1 tablet by mouth daily.     flecainide (TAMBOCOR) 100 MG tablet Take 1 tablet every 12 hours by oral route.     polyethylene glycol-electrolytes (NULYTELY) 420 g solution Take 4,000 mLs by mouth as directed.     sulfamethoxazole-trimethoprim (BACTRIM DS) 800-160 MG tablet Take 2 tablets by mouth 2 (two) times daily. (Patient not taking: Reported on 10/21/2021)     No current facility-administered medications for this visit.    Allergies-reviewed and updated Allergies  Allergen Reactions   Sulfamethoxazole-Trimethoprim Nausea And Vomiting    Social History   Social History Narrative   Patient is married with 4 children.   Patient is right handed.   Patient has college education.   Patient drinks 3 cups daily.     Immunization History  Administered Date(s) Administered   Influenza Split 01/13/2011, 01/14/2015, 12/27/2018   Influenza, High Dose Seasonal PF 01/08/2016, 01/05/2017, 01/17/2018, 01/13/2021   Influenza, Seasonal, Injecte, Preservative Fre 12/08/2012   Influenza,inj,quad, With Preservative 12/27/2018   Influenza-Unspecified 01/13/2011, 12/03/2013, 01/14/2015, 12/26/2016, 12/26/2018, 12/10/2019   Moderna Sars-Covid-2 Vaccination 05/14/2019   PFIZER Comirnaty(Gray Top)Covid-19 Tri-Sucrose Vaccine 04/18/2019, 05/08/2019, 01/07/2020   PFIZER(Purple Top)SARS-COV-2 Vaccination 04/18/2019, 05/08/2019, 01/07/2020   Pfizer Covid-19 Vaccine Bivalent Booster 63yrs & up 01/13/2021   Pneumococcal Conjugate-13 04/04/2006, 10/28/2013   Pneumococcal Polysaccharide-23 01/07/2006, 06/02/2006   Tdap 03/15/2004, 06/27/2007, 07/05/2007   Zoster, Live 06/02/2006      Objective:  BP (!) 158/78   Pulse (!) 58   Temp 97.8 F (36.6 C)   Ht 5\' 8"  (1.727 m)   Wt 179 lb 12.8 oz (81.6 kg)   SpO2 95%   BMI 27.34  kg/m    BMI Readings from Last 3 Encounters:  10/21/21 27.34 kg/m  07/22/21 27.62 kg/m  07/19/21 28.06 kg/m    BP Readings from Last 3 Encounters:  10/21/21 (!) 158/78  07/22/21 (!) 151/74  07/19/21 136/75     This is a very cordial and polite person who was a pleasure to meet.  He seems to have no cognitive impairment grossly but he does admit short term memory loss, he does have a little trouble remembering some of his imaging tests but he remembers most of  it. Gen: NAD, resting comfortably HEENT: Mucous membranes are moist. Sclera conjunctiva and lids grossly normal, Neck: no thyromegaly, no cervical lymphadenopathy CV: irregularly irregular, slightly slow no murmurs rubs or gallops Lungs: CTAB no crackles, wheeze, rhonchi Abdomen: soft/nontender/nondistended/normal bowel sounds. No rebound or guarding.  Ext: no edema Skin: warm, dry Neuro: CN2-12 tested and intacte e/c drooping right face,  Muscle wasting in calves r>L Marked cervical kyphosis. Reflexes 1+ bilateral patellar Balance tested: romberg neg but he falls over with attempting to step onto exam table- legs give out. He cant stand on one foot either but he can push up on both toes and there is no foot drop (listed as having foot drop in past) 1/5 strength in ble 1/5 strength in bilat upper extrem. Able to walk through clinic with reasonable stability using trekking pole cane  Reviewed labs and imaging: MRI CSPINE 07/26/21 1.   The spinal cord appears normal. 2.   No spinal stenosis. 3.   Mild multilevel degenerative changes as detailed above that do not lead to spinal stenosis or nerve root compression. 4.   T2 hyperintense foci within the pons consistent with chronic microvascular ischemic changes. ## he didn't remember having this done at time of visit  MR brain 03/2021 no contrast IMPRESSION: 1. No acute intracranial abnormality. 2. Moderate chronic small vessel ischemic disease. ## he didn't remember  having this at time of visit   MRI L-SPINE 10/2020 IMPRESSION: 1. 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 2. Facet arthropathy throughout the lumbar spine. ##he couldn't remember the specifics on this at time of visit  MRI abd 10/2020 6.3 cm complex cystic lesion in upper pole of right kidney, consistent with a Bosniak category 74F lesion. Recommend continued follow-up by MRI in 6 months ## he knew about this and says he has follow up planned soon with urology  EMG 07/22/2021, NOT YET RELEASED dr. Brett Fairy, completed last week  05/2021 duplex 1-39 % right carotid o/w neg  Notes from other providers were reviewed, although some not available.  Time Spent: 80 minutes of total time was spent on the date of the encounter performing the following actions: chart review prior to seeing the patient and during seeing the patient, obtaining history from the patient and his wife who supplemented due to his mild cognitive impairment, performing a comprehensive and medically necessary exam, counseling on the treatment plan, placing orders, and documenting in our EHR.   The main focus of my review and exam was to try to determine the cause of his recurrent falls which he doesn't understand the basis for despite having extensive prior workup.  I believe, after the extensive workup review and discussion and examination of the patient, that the falls are most likely due to severe spinal stenosis of lumbar spine-> gradual atrophy and weakness of the legs (and I also think this is the cause of his urinary incontinence).  I don't think he needs the loop recorder anymore to evaluate falls, and I don't think he has ever had syncope, so I encouraged him to discuss whether it may be removed with cardiology.  He does now have what seems to be newly progressive focal neuro deficit of right face  which could be due to new cerebral lesion so I obtained MRI brain as well, but I remain confident in my medical opinion that his main complaints are due to the lumbar spinal compression-and thus I want to get neurosurgery notes and see if they think an intervention could benefit him.  I was able to get his note from [facility] from care-everywhere but it seems they are addressing hips and knees only - I couldn't find any neuro surgery notes or follow up although his chart records a lumbar spinal fusion.  Today's healthcare provider: Loralee Pacas, MD

## 2021-10-21 NOTE — Assessment & Plan Note (Signed)
Will refer back to [facility] We discussed possible etiology

## 2021-10-21 NOTE — Assessment & Plan Note (Signed)
Stable Continue otc miralax prn

## 2021-10-21 NOTE — Assessment & Plan Note (Signed)
Patient wouldclike mri as drueling has been worsening.

## 2021-10-21 NOTE — Assessment & Plan Note (Signed)
Wean xanax Continue cymbalta and zoloft, but consider reducing at next visit.

## 2021-10-21 NOTE — Assessment & Plan Note (Signed)
Requested records, he feels that pain well controlled and that leg atrophy is unrelated.

## 2021-10-21 NOTE — Patient Instructions (Addendum)
Please sign releases for:    All MRI of spine    Records of neurosurgery on spine.    Records from Dr. Ethelene Hal and Dr. [name]    Note from [facility] and nerve testing results.    Ultrasounds of neck and legs.  We are referring back to physical therapy, please go aggressively Please start using walker Please mail in your handicap placard.    Call if you need med refills, I didn't change any medicine.  Encourage continue to reduce and try to stop xanax.  Come back fasting in 1 month for labs  Come back with MRI.

## 2021-10-21 NOTE — Assessment & Plan Note (Signed)
Evaluated his cognitive ability and he does have quite a bit of difficulty remembering managing his complex care.   I do agree with the 08/2021 neuropsych eval that the impairment doesn't seem that severe but given the complexity of his medical care it does create a challenge in organizing and executing on medical plans. I therefore arranged for care coordination

## 2021-10-21 NOTE — Assessment & Plan Note (Signed)
Continue flecainide Continue cardiology follow up Limit anticoag to asa only due to falls.

## 2021-11-02 DIAGNOSIS — R42 Dizziness and giddiness: Secondary | ICD-10-CM | POA: Insufficient documentation

## 2021-11-03 ENCOUNTER — Encounter: Payer: Self-pay | Admitting: Internal Medicine

## 2021-11-04 ENCOUNTER — Encounter: Payer: Self-pay | Admitting: Neurology

## 2021-11-04 DIAGNOSIS — M2041 Other hammer toe(s) (acquired), right foot: Secondary | ICD-10-CM

## 2021-11-04 DIAGNOSIS — R29898 Other symptoms and signs involving the musculoskeletal system: Secondary | ICD-10-CM

## 2021-11-04 DIAGNOSIS — M48061 Spinal stenosis, lumbar region without neurogenic claudication: Secondary | ICD-10-CM

## 2021-11-04 DIAGNOSIS — R296 Repeated falls: Secondary | ICD-10-CM

## 2021-11-04 DIAGNOSIS — Q667 Congenital pes cavus, unspecified foot: Secondary | ICD-10-CM

## 2021-11-04 DIAGNOSIS — R260 Ataxic gait: Secondary | ICD-10-CM

## 2021-11-04 DIAGNOSIS — G629 Polyneuropathy, unspecified: Secondary | ICD-10-CM

## 2021-11-04 DIAGNOSIS — R269 Unspecified abnormalities of gait and mobility: Secondary | ICD-10-CM

## 2021-11-05 ENCOUNTER — Other Ambulatory Visit: Payer: Self-pay | Admitting: Internal Medicine

## 2021-11-05 DIAGNOSIS — G629 Polyneuropathy, unspecified: Secondary | ICD-10-CM

## 2021-11-08 NOTE — Telephone Encounter (Signed)
Nerve biopsy. A small piece of peripheral nerve is taken from your calf through an incision in your skin. Laboratory analysis of the nerve distinguishes Charcot-Marie-Tooth disease from other nerve disorders. For this., I have to refer you to Dr Terrace Arabia here a [facility].   Genetic testing: These tests, which can detect the most common genetic defects known to cause Charcot-Marie-Tooth disease, are done with a blood sample. Not all tests are covered by insurance.  [facility] has this to offer:  Charcot-Marie-Tooth Disease (NGS Panel and Copy Number Analysis + mtDNA) TEST: [id number] . I have to inquire with local [facility] if that can be provided on Medicare.  I will place this order with our phlebotomist and you can come to have the test drawn in the coming days.  Melvyn Novas, MD

## 2021-11-09 ENCOUNTER — Other Ambulatory Visit: Payer: Self-pay | Admitting: Neurology

## 2021-11-10 ENCOUNTER — Telehealth: Payer: Self-pay | Admitting: Neurology

## 2021-11-10 NOTE — Telephone Encounter (Signed)
Referral sent to Dr. Freida Busman at [facility] [phone]

## 2021-11-22 ENCOUNTER — Ambulatory Visit (INDEPENDENT_AMBULATORY_CARE_PROVIDER_SITE_OTHER): Payer: Medicare Other | Admitting: Internal Medicine

## 2021-11-22 ENCOUNTER — Encounter: Payer: Self-pay | Admitting: Internal Medicine

## 2021-11-22 VITALS — BP 120/62 | HR 61 | Temp 97.3°F | Resp 12 | Ht 68.0 in | Wt 182.0 lb

## 2021-11-22 DIAGNOSIS — R413 Other amnesia: Secondary | ICD-10-CM | POA: Diagnosis not present

## 2021-11-22 DIAGNOSIS — R270 Ataxia, unspecified: Secondary | ICD-10-CM | POA: Diagnosis not present

## 2021-11-22 DIAGNOSIS — G629 Polyneuropathy, unspecified: Secondary | ICD-10-CM

## 2021-11-22 DIAGNOSIS — Z8673 Personal history of transient ischemic attack (TIA), and cerebral infarction without residual deficits: Secondary | ICD-10-CM

## 2021-11-22 DIAGNOSIS — I63139 Cerebral infarction due to embolism of unspecified carotid artery: Secondary | ICD-10-CM

## 2021-11-22 DIAGNOSIS — R296 Repeated falls: Secondary | ICD-10-CM

## 2021-11-22 LAB — CBC
HCT: 42.8 % (ref 39.0–52.0)
Hemoglobin: 14.5 g/dL (ref 13.0–17.0)
MCHC: 33.8 g/dL (ref 30.0–36.0)
MCV: 94.3 fl (ref 78.0–100.0)
Platelets: 254 10*3/uL (ref 150.0–400.0)
RBC: 4.54 Mil/uL (ref 4.22–5.81)
RDW: 14.1 % (ref 11.5–15.5)
WBC: 6.3 10*3/uL (ref 4.0–10.5)

## 2021-11-22 LAB — COMPREHENSIVE METABOLIC PANEL
ALT: 15 U/L (ref 0–53)
AST: 18 U/L (ref 0–37)
Albumin: 4.3 g/dL (ref 3.5–5.2)
Alkaline Phosphatase: 82 U/L (ref 39–117)
BUN: 23 mg/dL (ref 6–23)
CO2: 26 mEq/L (ref 19–32)
Calcium: 9.3 mg/dL (ref 8.4–10.5)
Chloride: 103 mEq/L (ref 96–112)
Creatinine, Ser: 1.34 mg/dL (ref 0.40–1.50)
GFR: 49.36 mL/min — ABNORMAL LOW (ref >60.00)
Glucose, Bld: 99 mg/dL (ref 70–99)
Potassium: 4.4 mEq/L (ref 3.5–5.1)
Sodium: 136 mEq/L (ref 135–145)
Total Bilirubin: 0.7 mg/dL (ref 0.2–1.2)
Total Protein: 6.9 g/dL (ref 6.0–8.3)

## 2021-11-22 LAB — B12 AND FOLATE PANEL
Folate: >23.9 ng/mL (ref >5.9)
Vitamin B-12: 497 pg/mL (ref 211–911)

## 2021-11-22 NOTE — Progress Notes (Signed)
Routine Medical Office Visit  Patient:  Brandon Mendoza      Age: 81 y.o.       Sex:  male  Date:   11/23/2021  PCP:    Brandon Olszewski, MD  Today's Healthcare Provider: Lula Olszewski, MD   Assessment/Plan:   We are laser focused on the falling problem today.  It seems clearly due to leg weakness and atrophy, not dizziness- he has had dizziness in past and has loop recorder but I think the falling / weakness in legs / ataxia.     Brandon Mendoza was seen today for follow-up.  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  After reviewing more records in detail, I decided that he should follow up with your orthopedic spinal surgeon, Brandon Mendoza, and complete another lower back MRI- I think its possible he might need revision surgery and he is open to doing that.  His quality of life / fall risk is very distressing and he is refusing to use walker  Although this is chronic progressive problem he wants to be aggressive about mgmt as it is putting him at very high risk so I ordered home and ambulatory phys therapy and MRI L-spine repeat to evaluate for worsening spine (from one of his many recent falls)   He was advised to call the office or go  to ER if his condition worsens\    Subjective:   Brandon Mendoza is a 81 y.o. 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   The spinal cord appears normal. 2.   No spinal stenosis. 3.   Mild multilevel degenerative changes as detailed above that do not lead to spinal stenosis or nerve root compression. 4.   T2 hyperintense foci within the pons consistent with chronic  microvascular ischemic changes.   Loop - [employer] Linq 10/22/2020 10/22/2020   Nocturia    Peripheral vascular disease (HCC)    Sleep apnea    on 6 cm, nasal pillow   Stroke (HCC) 05/01/2011   ischemic   Weakness generalized 10/21/2021   Severe, legs and arms     Presenting today with: Chief Complaint  Patient presents with   Follow-up    1 month. Recurrent falls are about the same.     Memory issues are persisting - mainly just the short term memory bothering him.  He is not concerned about that, wants to focus on the leg weakness which is primary detriment in his life, due to many falls  Main focus today is recurring falls and leg weakness, he has had numerous falls since last being seen.  He was never contacted by physical therapy,   He notes that every time he urinates he farts   Was seen in June with pt and they said the falling resolved 100% but he reports that was only temporarily.   His memory prevents him from  giving much details about the many falls since then.  He is going to see neuro in 20 days, and be tested for CMT since he feels he has all these symptoms.  Although the EMG I ordered wasn't done yet and I cant find the other, I was able to see the April emg results via indirectly by finding a note from another provider (see the bottom of this note)   Right leg collapses and goes backwards sometimes causing him to fall, feel sweaker and has more atrophy        Objective:  Physical Exam: BP 120/62 (BP Location: Left Arm, Patient Position: Sitting)   Pulse 61   Temp (!) 97.3 F (36.3 C) (Temporal)   Resp 12   Ht 5\' 8"  (1.727 m)   Wt 182 lb (82.6 kg)   SpO2 96%   BMI 27.67 kg/m    Gen: No acute distress, resting comfortably Psych: Normal affect and thought content  Problem specific physical exam findings:  Cannot stand on one foot for even 1 sec without going off balance Ble atrophy                                Old information of significant relevance pertaining to recent extensive workup for his main concern/problem today, reviewed from Dr. :  "Brandon Mendoza was evaluated in a MRI of the lumbar spine with and without contrast for recurrent stenosis of the lumbar spine. He had undergone surgery on 29 February 2022 but now has lower back pain again he also reports bilateral leg weakness. The MRI showed the typical postsurgical changes at L4 and L5 where he underwent decompressive intervention with pedicle screws. Incidental hemangioma at L1 and T12 was described. Bilateral cystic changes in the kidneys.  There is progression on 3 levels severe disc narrowing causing mild left and moderate right foraminal stenosis between L1 and L2. Progression at L2-L3 biforaminal extension of disc bulge mild central and mild to moderate by foraminal stenosis.  At L3-L4 progression moderate severe arthropathy moderate severe by foraminal stenosis and on  both sides the L3 nerve root seems to be touched by bone spurs but there is no clear evidence of compression. Also right-sided greater than left-sided L4 canal contact between disc and cord.  Improvement is seen at L4-5  the location of the previous surgery. There is still mild central stenosis noted and mild to moderate by foraminal stenosis is present.  At L5-S1 there is no change to the previous imaging study mild central canal stenosis moderate left and mild right lateral recess stenosis the conus is in normal position and adherence.  Exam date was 6-8 23.  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  There is no general peripheral neuropathy and no entrapment at the peroneal motor nerve was found .  This does not explain the buckling of his knees,but the feeling of weakness.  Symptoms of buttock pain may correlate with the advancing lumbar spinal stenosis.  Any further surgical treatment was not commented upon.  Recommendation would be that of limitation of all neurotoxins ( alcohol) , intake of multivitamin and mineral, possible epidural pain treatment ( non surgical) and follow up with his sports medicine and orthopedic physicians. "

## 2021-11-22 NOTE — Addendum Note (Signed)
Addended by: Lula Olszewski on: 11/22/2021 05:57 PM   Modules accepted: Orders

## 2021-11-22 NOTE — Patient Instructions (Addendum)
It was a pleasure seeing you today!  Today the plan is...  Stop duloxetine just to try to reduce total medicine burden.  Less pills reduces potential interactions.  Full court press on physical therapy- I reordered it and sent home health PT and want you to do both;   everything possible to stop further falls  Be sure to see neurology, in two weeks- to see about Brandon Mendoza Brandon Mendoza Tooth disease and the EMG to check for that.  I sent message to our referral coordinator to follow up with you about the MRI and physical therapy I ordered in July that is yet to be done- you should hear back from [place].   Request spine neurosurgery Brandon Mendoza and Brandon Mendoza information  After reviewing more of your records in detail, I decided that you should follow up with your orthopedic spinal surgeon, Dr. Shon Mendoza, and complete another lower back MRI-  Although I'm not sure its an option (why I'm getting the MRI of low back)  think its possible you might need revision surgery - I think its your low back problem that is progressing and causing leg weakness and falls, despite the prior surgery.      Brandon Olszewski, MD     - Please bring all your medicines to your next appointment. This is the best way for me to know exactly what you're taking.  - If your condition begins to worsen or become severe:  go to the ER. - If your condition fails to resolve or you have other questions / concerns: please contact me via phone [phone] or MyChart messaging.     IF you received an x-ray today, you will receive an invoice from [facility]. Please contact [facility] at [phone] with questions or concerns regarding your invoice.    IF you received labwork today, you will receive an invoice from [facility]. Please contact [facility] at [phone] with questions or concerns regarding your invoice.    Our billing staff will not be able to assist you with questions regarding bills from these companies.   You will  be contacted with the lab results as soon as they are available. The fastest way to get your results is to activate your My Chart account. Instructions are located on the last page of this paperwork. If you have not heard from [place] regarding the results in 2 weeks, please contact this office. For any labs or imaging tests, we will call you if the results are significantly abnormal.  Most normal results will be posted to myChart as soon as they are available and I will comment on them there within 2-3 business days.

## 2021-11-23 ENCOUNTER — Ambulatory Visit: Payer: Medicare Other | Admitting: Family Medicine

## 2021-11-24 LAB — METHYLMALONIC ACID, SERUM: Methylmalonic Acid, Quant: 187 nmol/L (ref 87–318)

## 2021-11-24 LAB — RPR: RPR Ser Ql: NONREACTIVE

## 2021-11-25 ENCOUNTER — Telehealth: Payer: Self-pay | Admitting: Internal Medicine

## 2021-11-25 NOTE — Telephone Encounter (Signed)
..  Home Health Verbal Orders  Agency:  [facility]    Caller: Ms. Earlene Plater, Physical Therapy   Requesting OT/ PT/ Skilled nursing/ Social Work/ Speech:  PT  Reason for Request:  Not communicated   Frequency:  -Once a week x 1 week Twice a week x 2 weeks  Once a week x 4 weeks  Once every 2 weeks for one week

## 2021-11-26 ENCOUNTER — Ambulatory Visit
Admission: RE | Admit: 2021-11-26 | Discharge: 2021-11-26 | Disposition: A | Discharge status: Home / Self Care | Payer: Medicare Other | Source: Ambulatory Visit | Attending: Internal Medicine | Admitting: Internal Medicine

## 2021-11-26 DIAGNOSIS — Z8673 Personal history of transient ischemic attack (TIA), and cerebral infarction without residual deficits: Secondary | ICD-10-CM

## 2021-11-26 DIAGNOSIS — R413 Other amnesia: Secondary | ICD-10-CM

## 2021-11-26 DIAGNOSIS — R29818 Other symptoms and signs involving the nervous system: Secondary | ICD-10-CM

## 2021-11-26 DIAGNOSIS — M62569 Muscle wasting and atrophy, not elsewhere classified, unspecified lower leg: Secondary | ICD-10-CM

## 2021-11-26 MED ORDER — GADOPICLENOL 0.5 MMOL/ML IV SOLN
8.0000 mL | Freq: Once | INTRAVENOUS | Status: AC | PRN
  Administered 2021-11-26: 8 mL via INTRAVENOUS

## 2021-11-27 ENCOUNTER — Ambulatory Visit
Admission: RE | Admit: 2021-11-27 | Discharge: 2021-11-27 | Disposition: A | Discharge status: Home / Self Care | Payer: Medicare Other | Source: Ambulatory Visit | Attending: Internal Medicine | Admitting: Internal Medicine

## 2021-11-27 ENCOUNTER — Encounter: Payer: Self-pay | Admitting: Internal Medicine

## 2021-11-27 DIAGNOSIS — R270 Ataxia, unspecified: Secondary | ICD-10-CM

## 2021-11-29 NOTE — Progress Notes (Signed)
We can go over all this at appointment but basically the low spine looks stable like the surgery worked.  I think we might have to order another mri.  I also want him to see neurologist soon about the leg weakness.  Also . I did order MRI brain repeat but havent got that yet.

## 2021-11-29 NOTE — Progress Notes (Signed)
Well I thought the brain mri or lower back MRI would explain the problem but it doesn't.   I'm confused about the leg weakness and falls- I really thought these MRI would find the cause but they did not.  I want him to keep working hard with phys therapy and see neurology soon for other ideas on the problem - to see if it reallly is Charcot Alen Bleacher

## 2021-11-30 ENCOUNTER — Encounter: Payer: Self-pay | Admitting: Internal Medicine

## 2021-11-30 ENCOUNTER — Other Ambulatory Visit: Payer: Self-pay | Admitting: Internal Medicine

## 2021-11-30 DIAGNOSIS — Z87891 Personal history of nicotine dependence: Secondary | ICD-10-CM

## 2021-11-30 DIAGNOSIS — Z9181 History of falling: Secondary | ICD-10-CM

## 2021-11-30 DIAGNOSIS — I739 Peripheral vascular disease, unspecified: Secondary | ICD-10-CM

## 2021-11-30 DIAGNOSIS — R296 Repeated falls: Secondary | ICD-10-CM

## 2021-11-30 DIAGNOSIS — M2042 Other hammer toe(s) (acquired), left foot: Secondary | ICD-10-CM

## 2021-11-30 DIAGNOSIS — R29898 Other symptoms and signs involving the musculoskeletal system: Secondary | ICD-10-CM

## 2021-11-30 DIAGNOSIS — M48061 Spinal stenosis, lumbar region without neurogenic claudication: Secondary | ICD-10-CM

## 2021-11-30 DIAGNOSIS — G4733 Obstructive sleep apnea (adult) (pediatric): Secondary | ICD-10-CM

## 2021-11-30 DIAGNOSIS — E785 Hyperlipidemia, unspecified: Secondary | ICD-10-CM

## 2021-11-30 DIAGNOSIS — N183 Chronic kidney disease, stage 3 unspecified: Secondary | ICD-10-CM | POA: Diagnosis not present

## 2021-11-30 DIAGNOSIS — M2041 Other hammer toe(s) (acquired), right foot: Secondary | ICD-10-CM

## 2021-11-30 DIAGNOSIS — I129 Hypertensive chronic kidney disease with stage 1 through stage 4 chronic kidney disease, or unspecified chronic kidney disease: Secondary | ICD-10-CM | POA: Diagnosis not present

## 2021-11-30 DIAGNOSIS — E871 Hypo-osmolality and hyponatremia: Secondary | ICD-10-CM

## 2021-11-30 DIAGNOSIS — D631 Anemia in chronic kidney disease: Secondary | ICD-10-CM | POA: Diagnosis not present

## 2021-11-30 DIAGNOSIS — Z981 Arthrodesis status: Secondary | ICD-10-CM

## 2021-11-30 DIAGNOSIS — F1010 Alcohol abuse, uncomplicated: Secondary | ICD-10-CM

## 2021-11-30 DIAGNOSIS — J449 Chronic obstructive pulmonary disease, unspecified: Secondary | ICD-10-CM

## 2021-11-30 DIAGNOSIS — L600 Ingrowing nail: Secondary | ICD-10-CM

## 2021-11-30 DIAGNOSIS — Z9889 Other specified postprocedural states: Secondary | ICD-10-CM

## 2021-11-30 DIAGNOSIS — B353 Tinea pedis: Secondary | ICD-10-CM

## 2021-11-30 DIAGNOSIS — M159 Polyosteoarthritis, unspecified: Secondary | ICD-10-CM

## 2021-11-30 DIAGNOSIS — I480 Paroxysmal atrial fibrillation: Secondary | ICD-10-CM

## 2021-11-30 DIAGNOSIS — F411 Generalized anxiety disorder: Secondary | ICD-10-CM

## 2021-11-30 DIAGNOSIS — K5900 Constipation, unspecified: Secondary | ICD-10-CM

## 2021-11-30 DIAGNOSIS — G609 Hereditary and idiopathic neuropathy, unspecified: Secondary | ICD-10-CM

## 2021-11-30 DIAGNOSIS — M5431 Sciatica, right side: Secondary | ICD-10-CM

## 2021-11-30 DIAGNOSIS — M5432 Sciatica, left side: Secondary | ICD-10-CM

## 2021-11-30 DIAGNOSIS — I69398 Other sequelae of cerebral infarction: Secondary | ICD-10-CM | POA: Diagnosis not present

## 2021-11-30 DIAGNOSIS — K219 Gastro-esophageal reflux disease without esophagitis: Secondary | ICD-10-CM

## 2021-11-30 DIAGNOSIS — F339 Major depressive disorder, recurrent, unspecified: Secondary | ICD-10-CM

## 2021-11-30 DIAGNOSIS — Z86010 Personal history of colonic polyps: Secondary | ICD-10-CM

## 2021-11-30 DIAGNOSIS — G8929 Other chronic pain: Secondary | ICD-10-CM

## 2021-11-30 DIAGNOSIS — M5136 Other intervertebral disc degeneration, lumbar region: Secondary | ICD-10-CM

## 2021-11-30 NOTE — Progress Notes (Signed)
Patient requested referral for 2nd opinion on recent lumbar MRI.  I facilitated and ordered as requested.  I have also reviewed the MRI and dont see any surgical targets but I would like patient to review the images with neurosurgeon and get another opinion on his leg weakness and falls.

## 2021-12-01 ENCOUNTER — Telehealth: Payer: Self-pay | Admitting: Internal Medicine

## 2021-12-01 ENCOUNTER — Other Ambulatory Visit: Payer: Medicare Other

## 2021-12-01 NOTE — Telephone Encounter (Signed)
..  Type of form received:POC  Additional comments:   Received by: [facility]  Form should be Faxed OE:[phone]  Form should be mailed to:  na  Is patient requesting call for pickup: na    Form placed:  Dr <Morrisons folder  Attach charge sheet.  Yes   Individual made aware of 3-5 business day turn around (Y/N)?

## 2021-12-01 NOTE — Telephone Encounter (Signed)
Spoke with Ms. Davis and gave verbal orders per Dr.Morrison.

## 2021-12-02 NOTE — Telephone Encounter (Signed)
I received these forms in Dr.Morrison's mailbox today.

## 2021-12-09 ENCOUNTER — Telehealth: Payer: Self-pay

## 2021-12-09 NOTE — Chronic Care Management (AMB) (Signed)
  Care Coordination   Note   12/09/2021 Name: CARMELLO CABINESS MRN: [id number] DOB: [birth year]  [name] is a 81 y.o. year old male who sees Lula Olszewski, MD for primary care. I reached out to [name] by phone today to offer care coordination services.  Mr. Shuler was given information about Care Coordination services today including:   The Care Coordination services include support from the care team which includes your Nurse Coordinator, Clinical Social Worker, or Pharmacist.  The Care Coordination team is here to help remove barriers to the health concerns and goals most important to you. Care Coordination services are voluntary, and the patient may decline or stop services at any time by request to their care team member.   Care Coordination Consent Status: Patient agreed to services and verbal consent obtained.   Follow up plan:  Telephone appointment with care coordination team member scheduled for:  12/10/2021  Encounter Outcome:  Pt. Scheduled  Penne Lash, RMA Care Guide [facility]  [place], [place] [address] Direct Dial: [phone] [email or web address]

## 2021-12-10 ENCOUNTER — Ambulatory Visit (INDEPENDENT_AMBULATORY_CARE_PROVIDER_SITE_OTHER): Payer: Medicare Other | Admitting: Neurology

## 2021-12-10 ENCOUNTER — Encounter: Payer: Self-pay | Admitting: *Deleted

## 2021-12-10 ENCOUNTER — Encounter: Payer: Self-pay | Admitting: Neurology

## 2021-12-10 ENCOUNTER — Ambulatory Visit: Payer: Self-pay | Admitting: *Deleted

## 2021-12-10 VITALS — BP 132/80 | HR 81 | Ht 69.0 in | Wt 183.0 lb

## 2021-12-10 DIAGNOSIS — I6523 Occlusion and stenosis of bilateral carotid arteries: Secondary | ICD-10-CM

## 2021-12-10 DIAGNOSIS — M48061 Spinal stenosis, lumbar region without neurogenic claudication: Secondary | ICD-10-CM

## 2021-12-10 DIAGNOSIS — R269 Unspecified abnormalities of gait and mobility: Secondary | ICD-10-CM | POA: Insufficient documentation

## 2021-12-10 NOTE — Patient Instructions (Addendum)
  Visit Information  Thank you for taking time to visit with me today. Please don't hesitate to contact me if I can be of assistance to you.   Following are the goals we discussed today:   Goals Addressed               This Visit's Progress     No (pt-stated)        COMPLETED: No needs (pt-stated)        Care Coordination Interventions: Advised patient to scheduled his AWV with his primary provider (receptive) Reviewed medications with patient and discussed adherence to medication with no refills needed Reviewed scheduled/upcoming provider appointments including pending appointments  Screening for signs and symptoms of depression related to chronic disease state  Assessed social determinant of health barriers Pt reports earlier issues related to falls however pt has seen a neurologist and has Antivert medication alone with in home PT for strengthening exercises. Encouraged pt to use all DME to prevent falls. Pt aware to contact her provider if condition worsens.           Please call the care guide team at [phone] if you need to cancel or reschedule your appointment.   If you are experiencing a Mental Health or Behavioral Health Crisis or need someone to talk to, please call the Suicide and Crisis Lifeline: 988  Patient verbalizes understanding of instructions and care plan provided today and agrees to view in MyChart. Active MyChart status and patient understanding of how to access instructions and care plan via MyChart confirmed with patient.     No further follow up required: No further needs    Elliot Cousin, RN Care Management Coordinator [facility] Main Office [phone]

## 2021-12-10 NOTE — Patient Outreach (Addendum)
  Care Coordination   Initial Visit Note   12/10/2021 Name: Brandon Mendoza MRN: [id number] DOB: Apr 18, 1940  Brandon Mendoza is a 81 y.o. year old male who sees Lula Olszewski, MD for primary care. I spoke with  Brandon Mendoza by phone today.  What matters to the patients health and wellness today?  No needs   Goals Addressed               This Visit's Progress     No (pt-stated)        COMPLETED: No needs (pt-stated)        Care Coordination Interventions: Advised patient to scheduled his AWV with his primary provider (receptive) Reviewed medications with patient and discussed adherence to medication with no refills needed Reviewed scheduled/upcoming provider appointments including pending appointments  Screening for signs and symptoms of depression related to chronic disease state  Assessed social determinant of health barriers Pt reports earlier issues related to falls however pt has seen a neurologist and has Antivert medication alone with in home PT for strengthening exercises. Encouraged pt to use all DME to prevent falls. Pt aware to contact her provider if condition worsens.         SDOH assessments and interventions completed:  Yes  SDOH Interventions Today    Flowsheet Row Most Recent Value  SDOH Interventions   Food Insecurity Interventions Intervention Not Indicated  Housing Interventions Intervention Not Indicated  Transportation Interventions Intervention Not Indicated        Care Coordination Interventions Activated:  Yes  Care Coordination Interventions:  Yes, provided   Follow up plan: No further intervention required.   Encounter Outcome:  Pt. Visit Completed   Elliot Cousin, RN Care Management Coordinator [facility] Main Office [phone]

## 2021-12-10 NOTE — Progress Notes (Signed)
Chief Complaint  Patient presents with   New Patient (Initial Visit)    Pt in 15   Pt states he is having balance issues .  Pt states 10 falls in last month      ASSESSMENT AND PLAN  Brandon Mendoza is a 81 y.o. 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 Ultrasound of carotid artery March 2023, left carotid artery patent carotid endarterectomy with no evidence for restenosis, right internal carotid less than 39% stenosis, antegrade flow of bilateral vertebral artery.  Lower extremity arterial Doppler study February 2021, no evidence of arterial occlusive disease,  MEDICAL HISTORY:  Brandon Mendoza, is a 81 year old male, seen in request by his primary care physician Dr. Jon Billings, Alycia Rossetti for slow worsening gait abnormality, he is a patient of Dr. Vickey Huger for obstructive sleep apnea  I reviewed and summarized the referring note. PMHX. HTN HLD Stroke COPD, quit smoke in 2006, was smoking 1ppd OSA Stroke in 2013, s/p left carotid artery endarterectomy Lumbar decompression surgery by Dr. Debbe Bales in Sept 2022, L4-5.  He used to be very active, played racquetball regularly on 05/18/2019, at that time he noticed increased low back pain, balance issues, underwent L4-5 decompression surgery by orthopedic surgeon Dr. 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 Bufford Buttner, only bilateral lower extremity motor examination was performed, evidence of bilateral peroneal, tibial motor prolonged distal latency decreased CMAP amplitude, slow conduction velocity. No EMG was performed, based on the test, Dr. 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  Personally reviewed pre surgical MRI lumbar from February 2022, L4-5 grade 1 anterolisthesis, with severe spinal stenosis moderate to severe left foraminal narrowing, facet arthropathy throughout the lumbar spine  Post L4-5 decompression MRI of lumbar spine from September 2023, posterior pedicle screw and rod fixation L4-5 without residual stenosis, progressive adjacent level disease at L3-4 with moderate right and mild left subarticular narrowing and moderate foraminal stenosis, progressive moderate facet hypertrophy at L1-2 with progressive whiteboard disc protrusion with moderate right foraminal stenosis  MRI of the brain with without contrast November 26, 2021, generalized atrophy moderate small vessel disease no acute abnormality, remote lacunar infarction of left corona radiata, left caudate head  MRI cervical spine, mild degenerative changes no significant canal or foraminal narrowing   PHYSICAL EXAM:   Vitals:   12/10/21 0916  BP: 132/80  Pulse: 81  Weight: 183 lb (83 kg)  Height: 5\' 9"  (1.753 m)   Not recorded  Body mass index is 27.02 kg/m.  PHYSICAL  EXAMNIATION:  Gen: NAD, conversant, well nourised, well groomed                     Cardiovascular: Regular rate rhythm, no peripheral edema, warm, nontender. Eyes: Conjunctivae clear without exudates or hemorrhage Neck: Supple, no carotid bruits. Pulmonary: Clear to auscultation bilaterally   NEUROLOGICAL EXAM:  MENTAL STATUS: Speech/cognition: Awake, alert, oriented to history taking and casual conversation CRANIAL NERVES: CN II: Visual fields are full to confrontation. Pupils are round equal and briskly reactive to light. CN III, IV, VI: extraocular movement are normal. No ptosis. CN V: Facial sensation is intact to light touch CN VII: Face is symmetric with normal eye closure  CN VIII: Hearing is normal to causal conversation. CN IX, X: Phonation is normal. CN XI: Head turning and shoulder shrug are intact  MOTOR: Bilateral hammertoes, bilateral upper extremity proximal lower extremity muscle strength is normal, mild right more than left ankle dorsiflexion/plantar flexion weakness, also mild to moderate bilateral toe extension flexion, eversion, inversion weakness  REFLEXES: Reflexes are 2+ and symmetric at the biceps, triceps, 3/3knees, and absent at ankles. Plantar responses are flexor.   SENSORY: Absent vibratory sensation and pinprick to ankle level COORDINATION: There is no trunk or limb dysmetria noted.  GAIT/STANCE: Can get up from seated position arm crossed, wide-based, stiff, unsteady, mild bilateral foot drop  REVIEW OF SYSTEMS:  Full 14 system review of systems performed and notable only for as above All other review of systems were negative.   ALLERGIES: Allergies  Allergen Reactions   Sulfamethoxazole-Trimethoprim Nausea And Vomiting    HOME MEDICATIONS: Current Outpatient Medications  Medication Sig Dispense Refill   ALPRAZolam (XANAX) 0.25 MG tablet every other day.     Ascorbic Acid (VITAMIN C) 1000 MG tablet Take by mouth.     aspirin 81 MG  chewable tablet Chew 1 tablet by mouth daily.     diclofenac (VOLTAREN) 75 MG EC tablet Take 1 tablet by mouth as needed.     flecainide (TAMBOCOR) 100 MG tablet Take 1 tablet every 12 hours by oral route.     losartan-hydrochlorothiazide (HYZAAR) 50-12.5 MG tablet Take 1 tablet by mouth every morning. 90 tablet 3   meclizine (ANTIVERT) 12.5 MG tablet as needed.     Melatonin 5 MG CHEW Chew 5 mg by mouth at bedtime as needed.     MULTIPLE VITAMIN PO Take 1 tablet by mouth daily.     rosuvastatin (CRESTOR) 20 MG tablet Take 10 mg by mouth at bedtime. 90 tablet 3   sertraline (ZOLOFT) 100 MG tablet Take 1 tablet (100 mg total) by mouth daily. 15 tablet 0   No current facility-administered medications for this visit.    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   The spinal cord appears normal. 2.   No spinal stenosis. 3.   Mild multilevel degenerative changes as detailed above that do not lead to spinal stenosis or nerve root compression. 4.   T2 hyperintense foci within the pons consistent with chronic microvascular ischemic changes.   Loop - Medtronic Linq 10/22/2020 10/22/2020   Nocturia    Peripheral vascular disease (HCC)    Sleep apnea    on 6 cm, nasal pillow   Stroke ([place]) 05/01/2011   ischemic   Weakness generalized 10/21/2021   Severe, legs and arms    PAST SURGICAL HISTORY: Past  Surgical History:  Procedure Laterality Date   CAROTID ENDARTERECTOMY  12/09/11   Left cea   ENDARTERECTOMY  12/09/2011   Procedure: ENDARTERECTOMY CAROTID;  Surgeon: Rosetta Posner, MD;  Location: [facility] OR;  Service: Vascular;  Laterality: Left;   EYE SURGERY     rt retina detachment   HAMMER TOE SURGERY  2004   HERNIA REPAIR  2006   SHOULDER ARTHROSCOPY W/ ROTATOR CUFF REPAIR  2010   TONSILLECTOMY      FAMILY HISTORY: Family History  Problem Relation Age of Onset   Alzheimer's disease Father    Diabetes Other     SOCIAL HISTORY: Social History   Socioeconomic History   Marital status: Married    Spouse name: Arville Go   Number of children: 4   Years of education: College   Highest education level: Not on file  Occupational History   Occupation: Retired  Tobacco Use   Smoking status: Former    Packs/day: 1.00    Years: 50.00    Total pack years: 50.00    Types: Cigarettes    Quit date: 08/26/2004    Years since quitting: 17.3    Passive exposure: Never   Smokeless tobacco: Never   Tobacco comments:    quit smoking 08/2004  Vaping Use   Vaping Use: Never used  Substance and Sexual Activity   Alcohol use: Yes    Alcohol/week: 7.0 standard drinks of alcohol    Types: 7 Shots of liquor per week    Comment: 2-3 daily cocktails   Drug use: No   Sexual activity: Not on file  Other Topics Concern   Not on file  Social History Narrative   Patient is married with 4 children.   Patient is right handed.   Patient has college education.   Patient drinks 3 cups daily.   Social Determinants of Health   Financial Resource Strain: Not on file  Food Insecurity: Not on file  Transportation Needs: Not on file  Physical Activity: Not on file  Stress: Not on file  Social Connections: Not on file  Intimate Partner Violence: Not on file      Marcial Pacas, M.D. Ph.D.  [facility] [address], [place], [place] [address] Ph: [phone] Fax:  [phone]  CC:  Loralee Pacas, MD [place],  [place] [address]  Loralee Pacas, MD

## 2021-12-13 ENCOUNTER — Telehealth: Payer: Self-pay | Admitting: Neurology

## 2021-12-13 NOTE — Telephone Encounter (Signed)
medicare/[employer] sup NPR sent to [facility]  

## 2021-12-14 ENCOUNTER — Ambulatory Visit (INDEPENDENT_AMBULATORY_CARE_PROVIDER_SITE_OTHER): Payer: Medicare Other | Admitting: Sports Medicine

## 2021-12-14 VITALS — BP 130/78 | Ht 69.0 in | Wt 182.0 lb

## 2021-12-14 DIAGNOSIS — Q6689 Other  specified congenital deformities of feet: Secondary | ICD-10-CM | POA: Insufficient documentation

## 2021-12-14 DIAGNOSIS — I6523 Occlusion and stenosis of bilateral carotid arteries: Secondary | ICD-10-CM

## 2021-12-14 NOTE — Assessment & Plan Note (Addendum)
Clinical evidence of bilateral clawfoot deformities.  Patient requesting custom orthotics.  He was placed today in sports insole with metatarsal pad, scaphoid pad and blue foam under the plantar surface.  He was able to ambulate in these with no immediate pain.  If he fails these insoles give him good support we can consider custom orthotics.  Will order orthotic pad with more cushion.  Follow-up in 3-4 weeks for custom orthotics. I do still think there is a component of neurogenic claudication and atrophy contributing to your balance.  We did review some further balance and isolated exercises. Continue with home exercise program and physical therapy.  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

## 2021-12-14 NOTE — Progress Notes (Signed)
   New Patient Office Visit  Subjective   Patient ID: Brandon Mendoza, male    DOB: 10-27-1940  Age: 81 y.o. MRN: [id number]  Hammer toe, requesting orthotics.  Brandon Mendoza presents today with chief complaint of some balance issues and deformity of bilateral feet.  He has had hammertoe surgery approximately 15 years in the past on his right foot.  Is looking for some orthotics that might help his leg weakness and balance.  Most of his weakness comes down when he is trying to mow the grass, unable to complete the yard without resting at this point he feels like his right knee is giving out causing him to fall.  He feels this may have some contribution from his bilateral feet.  He denies any pain in his feet, numbness and tingling or radiating pain from his low back.  His RT leg weakness probably relates to his known lumbar spinal stenosis and DDD He had surgery ~ 1 yr ago per patient - Dr Rolena Infante 9/22 with fusion  ROS as listed above in HPI    Objective:     BP 130/78   Ht 5\' 9"  (1.753 m)   Wt 182 lb (82.6 kg)   BMI 26.88 kg/m   Physical Exam Vitals reviewed.  Constitutional:      General: He is not in acute distress.    Appearance: Normal appearance. He is not ill-appearing, toxic-appearing or diaphoretic.  HENT:     Head: Normocephalic.  Pulmonary:     Effort: Pulmonary effort is normal.  Neurological:     Mental Status: He is alert.   Right foot:claw foot deformity, exaggerated longitudinal arch.  No tenderness to palpation.  There is collapse of horizontal arch.  Callus along the plantar surface under the metatarsal heads. Full range of motion of the ankle, strength 5/5 at the ankle plantar flexion dorsiflexion inversion and eversion Hammering and displacement of RT first toe  Left foot: There is a hallux valgus deformity of the great toe with claw foot deformity.  Collapse of the horizontal arch.  Callus on the plantar surface of the metatarsal heads.  No tenderness to  palpation.full range of motion at the ankle, strength 5/5 with ankle plantar flexion, dorsiflexion, inversion, eversion, supination and pronation. Light sensation intact to bilateral feet.  Distal pulses 2+ bilaterally. Nonantalgic gait.  He does heel strike with right foot eversion with gait.    Assessment & Plan:   Problem List Items Addressed This Visit       Other   Claw foot - Primary    Clinical evidence of bilateral clubfoot deformities.  Patient requesting custom orthotics.  He was placed today in sports insole with metatarsal pad, scaphoid pad and blue foam under the plantar surface.  He was able to ambulate in these with no immediate pain.  If he fails these insoles give him good support we can consider custom orthotics.  Will order orthotic pad with more cushion.  Follow-up in 3-4 weeks for custom orthotics. I do still think there is a component of neurogenic claudication and atrophy contributing to your balance.  We did review some further balance and isolated exercises. Continue with home exercise program and physical therapy.       Return in about 4 weeks (around 01/11/2022).    Elmore Guise, DO  I observed and examined the patient with the resident and agree with assessment and plan.  Note reviewed and modified by me. Ila Mcgill, MD

## 2021-12-16 ENCOUNTER — Telehealth: Payer: Self-pay | Admitting: Internal Medicine

## 2021-12-16 ENCOUNTER — Encounter: Payer: Self-pay | Admitting: Internal Medicine

## 2021-12-16 DIAGNOSIS — M21531 Acquired clawfoot, right foot: Secondary | ICD-10-CM

## 2021-12-16 DIAGNOSIS — M21532 Acquired clawfoot, left foot: Secondary | ICD-10-CM | POA: Insufficient documentation

## 2021-12-16 HISTORY — DX: Acquired clawfoot, right foot: M21.531

## 2021-12-16 NOTE — Telephone Encounter (Signed)
..   Encourage patient to contact the pharmacy for refills or they can request refills through [facility]:  Please schedule appointment if longer than 1 year  NEXT APPOINTMENT DATE:  MEDICATION: alprazolam 0.25 mg  Mclizine 25mg    Is the patient out of medication?  Yes   [facility]   Let patient know to contact pharmacy at the end of the day to make sure medication is ready.  Please notify patient to allow 48-72 hours to process

## 2021-12-17 ENCOUNTER — Other Ambulatory Visit: Payer: Medicare Other

## 2021-12-19 ENCOUNTER — Other Ambulatory Visit: Payer: Medicare Other

## 2021-12-20 ENCOUNTER — Ambulatory Visit
Admission: RE | Admit: 2021-12-20 | Discharge: 2021-12-20 | Disposition: A | Discharge status: Home / Self Care | Payer: Medicare Other | Source: Ambulatory Visit | Attending: Neurology | Admitting: Neurology

## 2021-12-20 DIAGNOSIS — R269 Unspecified abnormalities of gait and mobility: Secondary | ICD-10-CM

## 2021-12-20 DIAGNOSIS — M48061 Spinal stenosis, lumbar region without neurogenic claudication: Secondary | ICD-10-CM

## 2021-12-21 ENCOUNTER — Telehealth: Payer: Self-pay | Admitting: Internal Medicine

## 2021-12-21 NOTE — Telephone Encounter (Signed)
Caller is Eber Jones, PTA from [place] states: -Patient has had multiple falls over the past 3 weeks. Most recent being 09/22 and 09/25 - Falls occurred when patient was going down front porch steps and filling lawn mower up with gas  - PTA has been aggressive with fall prevention and balance training  - Patient denied any head trauma from falls, only minor skin tears  For more information, may call Saralyn Pilar at [phone].

## 2021-12-22 ENCOUNTER — Encounter: Payer: Self-pay | Admitting: Internal Medicine

## 2021-12-22 ENCOUNTER — Ambulatory Visit (INDEPENDENT_AMBULATORY_CARE_PROVIDER_SITE_OTHER): Payer: Medicare Other | Admitting: Internal Medicine

## 2021-12-22 VITALS — BP 116/68 | HR 64 | Temp 97.4°F | Resp 12 | Ht 69.0 in | Wt 183.8 lb

## 2021-12-22 DIAGNOSIS — G4701 Insomnia due to medical condition: Secondary | ICD-10-CM

## 2021-12-22 DIAGNOSIS — R269 Unspecified abnormalities of gait and mobility: Secondary | ICD-10-CM | POA: Diagnosis not present

## 2021-12-22 DIAGNOSIS — R270 Ataxia, unspecified: Secondary | ICD-10-CM

## 2021-12-22 DIAGNOSIS — Z23 Encounter for immunization: Secondary | ICD-10-CM

## 2021-12-22 DIAGNOSIS — R296 Repeated falls: Secondary | ICD-10-CM

## 2021-12-22 MED ORDER — ALPRAZOLAM 0.25 MG PO TABS: 0.2500 mg | ORAL_TABLET | Freq: Every day | ORAL | 0 refills | Status: DC | PRN

## 2021-12-22 NOTE — Progress Notes (Signed)
[place] at [employer] [employer]:  [phone]   Routine Medical Office Visit  Patient:  Brandon Mendoza      Age: 81 y.o.       Sex:  male  Date:   12/22/2021  PCP:    Loralee Pacas, [place] Provider: Loralee Pacas, MD  Assessment/Plan:   Brandon Mendoza was seen today for 1 month follow-up.  Insomnia due to medical condition -     ALPRAZolam; 1 tablet (0.25 mg total) by Per NG tube route daily as needed for anxiety.  Dispense: 90 tablet; Refill: 0  Gait abnormality  Ataxia  Need for immunization against influenza -     Flu Vaccine QUAD High Dose(Fluad)  Recurrent falls     I tried to counsel him about other things to prevent falls besides just walkers such as upgrades on canes that have 4 prongs and perhaps getting up more slowly from sitting because he seems to be very off balance then and I recommend against being on unlevel ground or in dark areas because he had a positive Romberg for falling back when he tried to close his eyes admit also help to demonstrate that his falls are multifactorial and not just due to to leg weakness.  We discussed his recent MRI which does show an old stroke actually extensive poor blood flow to parts of his brain from long-term vascular disease and so I encouraged aggressive cholesterol therapy and continue on the aspirin unless the neurologist says any otherwise even though it does increase his risk of falls we discussed this risk-benefit in detail  He reports he will not use walker and not use handrails or full home carpeting as I advise, and has capacity to make this decision  He reports he needs alprazolam a little longer in order to be able to sleep. His wife will support the plan we had initially that this script will be the last and he will taper it off as soon as possible.   Common side effects, risks, benefits, and alternatives for medications and treatment plan prescribed today were discussed, and he expressed  understanding of the given instructions.  Medication list was reconciled and provided to the patient in the AVS. Patient instructions and summary information was reviewed with him as documented in the AVS.  Patient is instructed to call or message via MyChart if he has any questions or concerns regarding our treatment plan.  No barriers to understanding were identified.  We discussed Red Flag symptoms and signs in detail. He expressed understanding regarding what to do in case of urgent or emergency type symptoms.  He was advised to call the office or go to ER if his condition worsens. Additional information was provided in the AVS (see AVS) regarding the diagnosis/treatment plan for him to review    Subjective:   Brandon Mendoza is a 81 y.o. 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   The spinal cord appears normal. 2.   No spinal stenosis. 3.   Mild multilevel degenerative changes as detailed above that do not lead to spinal stenosis or nerve root compression. 4.   T2 hyperintense foci within the pons consistent with chronic microvascular ischemic changes.   Loop - Medtronic Linq 10/22/2020 10/22/2020   Nocturia    Peripheral vascular disease (HCC)    Sleep apnea    on 6 cm, nasal pillow   Stroke ([place]) 05/01/2011   ischemic   Weakness generalized  10/21/2021   Severe, legs and arms     He main concern for today's visit is: Chief Complaint  Patient presents with   1 month follow-up    Requests a new prescription of xanax 90 day supply instead of 30 day supply, as it's cheaper. Everything is about the same.     Additional physician collected history: He reports having had several falls since our last visit and that he is having difficulty getting sleep without the Xanax which she had tapered off.  He would like a 90-day supply due to it being much cheaper and he is not interested in having a repeat sleep study although he does report significant daytime somnolence even with wearing his CPAP. His wife is agreeable to assisting with the plan for a taper off the Xanax as previous where today's prescription will be for the 90-day supply requested but the goal is that he will make it last for 6 to 12 months as he slowly tapers himself off of it completely to reduce its risk of dementia He denies any pain anywhere or any worsening of function since his recent 3 falls.  One was from lawnmower gas filling.        Objective:  Physical Exam: BP 116/68 (BP Location: Left Arm, Patient Position: Sitting)   Pulse 64   Temp (!) 97.4 F (36.3 C) (Temporal)   Resp 12   Ht $R'5\' 9"'eS$  (1.753 m)   Wt 183 lb 12.8 oz (83.4 kg)   SpO2 93%   BMI 27.14 kg/m   He  is a polite, friendly, and genuine person Constitutional: NAD, AAO, not ill-appearing  Neuro: alert, no focal deficit obvious, articulate speech Psych: normal mood, behavior, thought content   Problem specific physical exam findings:  Unsteady gait Unstead rise to stand Falls with romberg Has graphite cane\\ Recent Results (from the past 2160 hour(s))  B12 and Folate Panel     Status: None   Collection Time: 11/22/21  9:56 AM  Result Value Ref Range   Vitamin B-12 497 211 - 911 pg/mL   Folate >23.9 >5.9 ng/mL  Methylmalonic acid, serum     Status: None   Collection Time: 11/22/21  9:56  AM  Result Value Ref Range   Methylmalonic Acid, Quant 187 87 - 318 nmol/L    Comment: . This test was developed and its analytical performance characteristics have been determined by [place], [place]. It has not been cleared or approved by the U.S. Food and Drug Administration. This assay has been validated pursuant to the CLIA regulations and is used for clinical purposes. .   RPR     Status: None   Collection Time: 11/22/21  9:56 AM  Result  Value Ref Range   RPR Ser Ql NON-REACTIVE NON-REACTIVE  CBC     Status: None   Collection Time: 11/22/21  9:56 AM  Result Value Ref Range   WBC 6.3 4.0 - 10.5 K/uL   RBC 4.54 4.22 - 5.81 Mil/uL   Platelets 254.0 150.0 - 400.0 K/uL   Hemoglobin 14.5 13.0 - 17.0 g/dL   HCT 42.8 39.0 - 52.0 %   MCV 94.3 78.0 - 100.0 fl   MCHC 33.8 30.0 - 36.0 g/dL   RDW 14.1 11.5 - 15.5 %  Comp Met (CMET)     Status: Abnormal   Collection Time: 11/22/21  9:56 AM  Result Value Ref Range   Sodium 136 135 - 145 mEq/L   Potassium 4.4 3.5 - 5.1 mEq/L   Chloride 103 96 - 112 mEq/L   CO2 26 19 - 32 mEq/L   Glucose, Bld 99 70 - 99 mg/dL   BUN 23 6 - 23 mg/dL   Creatinine, Ser 1.34 0.40 - 1.50 mg/dL   Total Bilirubin 0.7 0.2 - 1.2 mg/dL   Alkaline Phosphatase 82 39 - 117 U/L   AST 18 0 - 37 U/L   ALT 15 0 - 53 U/L   Total Protein 6.9 6.0 - 8.3 g/dL   Albumin 4.3 3.5 - 5.2 g/dL   GFR 49.36 (L) >60.00 mL/min    Comment: Calculated using the CKD-EPI Creatinine Equation (2021)   Calcium 9.3 8.4 - 10.5 mg/dL

## 2021-12-23 NOTE — Telephone Encounter (Signed)
I see that you have taken care of the prescription for the xanax during their office visit yesterday. 

## 2021-12-23 NOTE — Telephone Encounter (Signed)
I see that you have taken care of the prescription for the xanax during their office visit yesterday.

## 2021-12-27 ENCOUNTER — Telehealth: Payer: Self-pay | Admitting: Internal Medicine

## 2021-12-27 NOTE — Telephone Encounter (Signed)
Olevia Bowens, PT with [facility], stated patient reports a fall in his front lawn yesterday. Pt states left lower extremity ankle swelling, tenderness when walking, and pain from top of foot rating a 3 or 4 out of 10.  Davis reports BP: 136/72    Temp: 97.1    Pulse: 74

## 2021-12-29 ENCOUNTER — Encounter: Payer: Medicare Other | Admitting: Sports Medicine

## 2021-12-29 NOTE — Telephone Encounter (Signed)
Patient returned call. Patient states he "just turned his ankle a little bit and it is no big deal".

## 2021-12-29 NOTE — Telephone Encounter (Signed)
Left vm for patient to call the office back concerning this. 

## 2022-01-01 NOTE — Progress Notes (Signed)
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 Damage to the tibial nerve may result from body-wide (systemic) diseases, such as diabetes, low thyroid function (hypothyroidism), arthritis, alcohol abuse, or hereditary nerve problems. The same conditions cause peroneal nerve motor neuropathy- Clinical presentation of motor neuropathy includes weakness of ankle upward foot flexion, great toe extension, foot outward rotation, and sensory loss to the top of the foot.  In some cases, no cause can be found.  The inherited Charcot- Marie- Tooth (CMT) neuropathy is associated with foot deformity ( Hammertoes and very high arches) and hand deformities-and with sensory abnormalities.

## 2022-01-03 ENCOUNTER — Ambulatory Visit: Payer: Medicare Other | Admitting: Cardiology

## 2022-01-03 ENCOUNTER — Encounter: Payer: Self-pay | Admitting: Internal Medicine

## 2022-01-03 DIAGNOSIS — M47816 Spondylosis without myelopathy or radiculopathy, lumbar region: Secondary | ICD-10-CM | POA: Insufficient documentation

## 2022-01-04 ENCOUNTER — Ambulatory Visit: Payer: Medicare Other | Admitting: Cardiology

## 2022-01-04 ENCOUNTER — Encounter: Payer: Self-pay | Admitting: Cardiology

## 2022-01-04 VITALS — BP 130/80 | HR 64 | Temp 98.0°F | Resp 16 | Ht 69.0 in | Wt 184.0 lb

## 2022-01-04 DIAGNOSIS — I480 Paroxysmal atrial fibrillation: Secondary | ICD-10-CM

## 2022-01-04 DIAGNOSIS — Z9889 Other specified postprocedural states: Secondary | ICD-10-CM

## 2022-01-04 DIAGNOSIS — I10 Essential (primary) hypertension: Secondary | ICD-10-CM

## 2022-01-04 NOTE — Progress Notes (Signed)
OK to supplement B 12 , non oral.

## 2022-01-04 NOTE — Progress Notes (Signed)
Primary Physician/Referring:  Loralee Pacas, MD  Patient ID: Brandon Mendoza, male    DOB: 04-May-1940, 81 y.o.   MRN: [id number]  Chief Complaint  Patient presents with   Atrial Fibrillation   Hypertension   Hyperlipidemia   Follow-up    6 month   HPI:    Brandon Mendoza  is a 81 y.o. Caucasian male patient with hypertension, hyperlipidemia, remote stroke with left carotid endarterectomy and follows Dr. 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  He is not on anticoagulation in view of spontaneous muscle hematoma and frequent falls.  He also underwent loop recorder implantation for evaluation of bradycardia and frequent fall on 10/22/2020.     His main issue continues to be weakness in his lower extremities and muscle weakness.  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 No chest pain, dyspnea.   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   The spinal cord appears normal. 2.   No spinal stenosis. 3.   Mild multilevel degenerative changes as  detailed above that do not lead to spinal stenosis or nerve root compression. 4.   T2 hyperintense foci within the pons consistent with chronic microvascular ischemic changes.   Loop - Medtronic Linq 10/22/2020 10/22/2020   Nocturia    Peripheral vascular disease (HCC)    Sleep apnea    on 6 cm, nasal pillow   Stroke ([place]) 05/01/2011   ischemic   Weakness generalized 10/21/2021   Severe, legs and arms     Social History   Tobacco Use   Smoking status: Former    Packs/day: 1.00    Years: 50.00    Total pack years: 50.00    Types: Cigarettes    Quit date: 08/26/2004    Years since quitting: 17.3    Passive exposure: Never   Smokeless tobacco: Never   Tobacco comments:    quit smoking 08/2004  Substance Use Topics   Alcohol use: Yes    Alcohol/week: 7.0 standard drinks of alcohol    Types: 7 Shots of liquor per week    Comment: 2-3 daily cocktails   Marital Status: Married  ROS  Review of Systems  Cardiovascular:  Negative for chest pain, dyspnea on exertion and leg swelling.  Gastrointestinal:  Negative for melena.  Neurological:  Positive for focal weakness (legs) and loss of balance.   Objective  Blood pressure 136/82, pulse 64, temperature 98 F (36.7 C), temperature source Temporal, resp. rate 16, height  $'5\' 9"'o$  (1.753 m), weight 184 lb (83.5 kg), SpO2 94 %. Body mass index is 27.17 kg/m.     01/04/2022    2:24 PM 01/04/2022    2:23 PM 12/22/2021    9:31 AM  Vitals with BMI  Height  $Remov'5\' 9"'OqcfRn$  $Remove'5\' 9"'waLrYet$   Weight  184 lbs 183 lbs 13 oz  BMI  13.24 40.10  Systolic 272 536 644  Diastolic 82 77 68  Pulse 64 63 64    Orthostatic VS for the past 72 hrs (Last 3 readings):  Patient Position BP Location Cuff Size  01/04/22 1424 Sitting Left Arm Normal  01/04/22 1423 Sitting Left Arm Normal    Physical Exam Neck:     Vascular: No carotid bruit (Left CEA scar noted) or JVD.  Cardiovascular:     Rate and Rhythm: Normal rate and regular rhythm.     Pulses: Intact distal pulses.      Heart sounds: Normal heart sounds. No murmur heard.    No gallop.  Pulmonary:     Effort: Pulmonary effort is normal.     Breath sounds: Normal breath sounds.  Abdominal:     General: Bowel sounds are normal.     Palpations: Abdomen is soft.  Musculoskeletal:        General: No swelling.  Neurological:     Gait: Gait abnormal.      Medications and allergies   Allergies  Allergen Reactions   Sulfamethoxazole-Trimethoprim Nausea And Vomiting      Medication list after today's encounter   Current Outpatient Medications:    ALPRAZolam (XANAX) 0.25 MG tablet, 1 tablet (0.25 mg total) by Per NG tube route daily as needed for anxiety., Disp: 90 tablet, Rfl: 0   Ascorbic Acid (VITAMIN C) 1000 MG tablet, Take by mouth., Disp: , Rfl:    aspirin 81 MG chewable tablet, Chew 1 tablet by mouth daily., Disp: , Rfl:    diclofenac (VOLTAREN) 75 MG EC tablet, Take 1 tablet by mouth as needed., Disp: , Rfl:    flecainide (TAMBOCOR) 100 MG tablet, Take 1 tablet every 12 hours by oral route., Disp: , Rfl:    losartan-hydrochlorothiazide (HYZAAR) 50-12.5 MG tablet, Take 1 tablet by mouth every morning., Disp: 90 tablet, Rfl: 3   meclizine (ANTIVERT) 12.5 MG tablet, as needed., Disp: , Rfl:    Melatonin 5 MG CHEW, Chew 5 mg by mouth at bedtime as needed., Disp: , Rfl:    MULTIPLE VITAMIN PO, Take 1 tablet by mouth daily., Disp: , Rfl:    sertraline (ZOLOFT) 100 MG tablet, Take 1 tablet (100 mg total) by mouth daily., Disp: 15 tablet, Rfl: 0   rosuvastatin (CRESTOR) 20 MG tablet, Take 10 mg by mouth at bedtime. (Patient not taking: Reported on 01/04/2022), Disp: 90 tablet, Rfl: 3   Laboratory examination:      Latest Ref Rng & Units 11/22/2021    9:56 AM 12/10/2020    9:18 AM 09/16/2020    1:55 AM  CMP  Glucose 70 - 99 mg/dL 99  72  91   BUN 6 - 23 mg/dL $Remove'23  21  25   'tLofgUl$ Creatinine 0.40 - 1.50 mg/dL 1.34  1.18  1.13   Sodium 135 - 145 mEq/L 136  137  135   Potassium 3.5 - 5.1 mEq/L 4.4  4.0   4.2   Chloride 96 - 112 mEq/L 103  101  104   CO2 19 - 32 mEq/L 26  26  24  Calcium 8.4 - 10.5 mg/dL 9.3  9.6  8.4   Total Protein 6.0 - 8.3 g/dL 6.9     Total Bilirubin 0.2 - 1.2 mg/dL 0.7     Alkaline Phos 39 - 117 U/L 82     AST 0 - 37 U/L 18     ALT 0 - 53 U/L 15         Latest Ref Rng & Units 11/22/2021    9:56 AM 12/10/2020    9:18 AM 09/16/2020    1:55 AM  CBC  WBC 4.0 - 10.5 K/uL 6.3  7.4  8.1   Hemoglobin 13.0 - 17.0 g/dL 14.5  16.0  8.3   Hematocrit 39.0 - 52.0 % 42.8  46.6  24.3   Platelets 150.0 - 400.0 K/uL 254.0  265  210    Lipid Panel     Component Value Date/Time   CHOL 158 07/15/2021 0834   TRIG 72 07/15/2021 0834   HDL 59 07/15/2021 0834   CHOLHDL 2.2 11/30/2011 0702   VLDL 17 11/30/2011 0702   LDLCALC 85 07/15/2021 0834   HEMOGLOBIN A1C Lab Results  Component Value Date   HGBA1C 5.7 (H) 11/30/2011   MPG 117 (H) 11/30/2011   No results found for: "TSH"   External labs:   Labs 02/23/2021: Potassium 4.8, BUN 23, creatinine 1.15, EGFR 64 mL.   Total cholesterol 155, triglycerides 69, HDL 62, LDL 81.  TSH normal.  A1c 5.1%.  Hb 14.3/HCT 42.2, platelets 227.  Radiology:   No results found.  Cardiac Studies:   Echocardiogram 11/30/2011: Normal LV systolic function, EF 60 to 65%.  No significant valvular abnormality.  Normal left atrial size.  Carotid artery duplex 06/15/2021: Minimal stenosis in the right carotid artery of 1 to 39%, left carotid endarterectomy site widely patent.  Antegrade vertebral artery flow.  Normal flow dynamics in bilateral subclavian arteries.  Loop - Medtronic Linq 10/22/2020   Remote loop recorder transmission.  12/29/2021: Predominant rhythm is normal sinus rhythm.  PAF, longest 2 hours 12/08/21.   No symptoms reported, no bradycardia.  EKG:   EKG 01/04/2022: SSinus rhythm with first-degree block 63 bpm, normal axis, no evidence of ischemia, normal EKG. no significant change from 07/19/2021.   Assessment      ICD-10-CM   1. AF (paroxysmal atrial fibrillation) (HCC)  I48.0 EKG 12-Lead    2. Primary hypertension  I10     3. History of left-sided carotid endarterectomy  Z98.890     4. Loop - Medtronic Linq 10/22/2020  Z98.890        Orders Placed This Encounter  Procedures   EKG 12-Lead   Recommendations:   Brandon Mendoza is a 81 y.o. Caucasian male patient with hypertension, hyperlipidemia, remote stroke with left carotid endarterectomy and follows Dr. 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  He is not on anticoagulation in view of spontaneous muscle hematoma and frequent falls.  He also underwent loop recorder implantation for evaluation of bradycardia and frequent fall on 10/22/2020.     His main issue continues to be weakness in his lower extremities and muscle weakness.  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  Previously on his last office  visit 6 months ago he had discontinued Crestor for 6 weeks but restarted it back as there was no change in his symptoms.  This time he would like to try it for 3 months which I have agreed.  I would like to see him back at that time to make sure that he is on some form of lipid-lowering therapy.  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  With regard to atrial fibrillation, he has not had any further sustained episodes of atrial fibrillation and he is presently has a loop recorder, the longest he has had is about 2 to 3 hours.  As there is a clear-cut contraindication for anticoagulation, if he has sustained episodes of atrial fibrillation then we can consider Watchman device with  brief and temporary use of Eliquis.  Blood pressures well controlled.  Lipids although not at goal, in view of myopathy, I have not increased the dose of Crestor or adding Zetia to his because of patient concerns as well.  Office visit in 3 months and if he remains stable on a 6 monthly basis.  He is being closely followed by neurology as well for his leg weakness.      Adrian Prows, MD, [facility] 01/04/2022, 2:42 PM Office: [phone] Fax: [phone] Pager: [phone]

## 2022-01-05 ENCOUNTER — Ambulatory Visit (INDEPENDENT_AMBULATORY_CARE_PROVIDER_SITE_OTHER): Payer: Medicare Other | Admitting: Sports Medicine

## 2022-01-05 ENCOUNTER — Encounter: Payer: Self-pay | Admitting: Sports Medicine

## 2022-01-05 VITALS — BP 124/60 | Ht 69.0 in

## 2022-01-05 DIAGNOSIS — Q6689 Other  specified congenital deformities of feet: Secondary | ICD-10-CM | POA: Diagnosis present

## 2022-01-05 NOTE — Progress Notes (Signed)
   Established Patient Office Visit  Subjective   Patient ID: Brandon Mendoza, male    DOB: Jun 20, 1940  Age: 81 y.o. MRN: [id number]  Custom orthotics.   Brandon Mendoza presents today for custom orthotics.  He has been using sport insole with medium metatarsal pad and forefoot padding with no complaints.  He would like to have some custom was made as he feels it may be better for his balance.  He has had a couple hammertoe surgeries in the past, inquiring if he should have repeat surgery.  Discussed with him this may lead to further surgeries and complications of his clawfoot.  Recommend further discussion with podiatry or foot and ankle specialist but I do not feel he is in need of surgery at this time.   ROS as listed above in HPI    Objective:     BP 124/60   Ht 5\' 9"  (1.753 m)   BMI 27.17 kg/m   Physical Exam Vitals reviewed.  Constitutional:      General: He is not in acute distress.    Appearance: Normal appearance. He is not ill-appearing, toxic-appearing or diaphoretic.  HENT:     Head: Normocephalic.  Pulmonary:     Effort: Pulmonary effort is normal.  Musculoskeletal:     Comments: Ambulates with cane in left hand, unsteady, wide-based gait  Neurological:     Mental Status: He is alert.   Right foot:claw foot deformity, exaggerated longitudinal arch.  No tenderness to palpation.  There is collapse of horizontal arch.  Callus along the plantar surface under the metatarsal heads. 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  Collapse of the horizontal arch.  Callus on the plantar surface of the metatarsal heads. full range of motion at the ankle Light sensation intact to bilateral feet.  Nonantalgic gait.  He does heel strike with right foot eversion with gait.    Assessment & Plan:   Problem List Items Addressed  This Visit       Other   Claw foot - Primary    Custom orthotics made today.  Return to clinic as needed for any adjustments.  He ambulated in his orthotics with no pain.  Recommend using these and all of his walking shoes.      Patient was fitted for a : standard, cushioned, smartcell orthotic. The orthotic was heated and afterward the patient stood on the orthotic base positioned on the orthotic stand. The patient was positioned in subtalar neutral position and 10 degrees of ankle dorsiflexion in a weight bearing stance. After completion of molding.  Size: Mens 9 Base: None Additional Posting and Padding: None Large metatarsal pad added bilaterally The patient ambulated these, and they were very comfortable.  Return if symptoms worsen or fail to improve.    Elmore Guise, DO  Addendum:  Patient seen in the office by fellow.  Her history, exam, plan of care were precepted with me.  [name] MD [name]

## 2022-01-05 NOTE — Assessment & Plan Note (Addendum)
Custom orthotics made today.  Return to clinic as needed for any adjustments.  He ambulated in his orthotics with no pain.  Recommend using these and all of his walking shoes.

## 2022-01-12 ENCOUNTER — Ambulatory Visit (INDEPENDENT_AMBULATORY_CARE_PROVIDER_SITE_OTHER): Payer: Medicare Other

## 2022-01-12 DIAGNOSIS — E538 Deficiency of other specified B group vitamins: Secondary | ICD-10-CM

## 2022-01-12 MED ORDER — CYANOCOBALAMIN 1000 MCG/ML IJ SOLN
1000.0000 ug | Freq: Once | INTRAMUSCULAR | Status: AC
  Administered 2022-01-12: 1000 ug via INTRAMUSCULAR

## 2022-01-12 NOTE — Progress Notes (Signed)
Administered CYANOCOBALAMIN 1,00 mcg IM left arm. Patient tolerated well. Aware to return next week for next injection per Berniece Pap, MD.

## 2022-01-17 ENCOUNTER — Telehealth: Payer: Self-pay | Admitting: Internal Medicine

## 2022-01-17 NOTE — Telephone Encounter (Signed)
Pt has another B12 injection scheduled on 01/19/22. He wants to know why he is needing these. Please advise

## 2022-01-18 ENCOUNTER — Encounter: Payer: Self-pay | Admitting: Internal Medicine

## 2022-01-18 ENCOUNTER — Ambulatory Visit (INDEPENDENT_AMBULATORY_CARE_PROVIDER_SITE_OTHER): Payer: Medicare Other

## 2022-01-18 DIAGNOSIS — E538 Deficiency of other specified B group vitamins: Secondary | ICD-10-CM | POA: Diagnosis not present

## 2022-01-18 MED ORDER — CYANOCOBALAMIN 1000 MCG/ML IJ SOLN
1000.0000 ug | Freq: Once | INTRAMUSCULAR | Status: AC
  Administered 2022-01-18: 1000 ug via INTRAMUSCULAR

## 2022-01-18 NOTE — Telephone Encounter (Signed)
Left vm to call the office back

## 2022-01-18 NOTE — Progress Notes (Signed)
Pt here for B12 injection for Dr. Randol Kern. Injection given in left deltoid. Pt tolerated well.

## 2022-01-19 ENCOUNTER — Ambulatory Visit: Payer: Medicare Other

## 2022-01-19 NOTE — Telephone Encounter (Signed)
Patient returned call-requests to be called at ph# [phone]

## 2022-01-19 NOTE — Telephone Encounter (Signed)
Spoke with patient's wife, she will relay the message for him to give me a call back at the office.

## 2022-01-20 ENCOUNTER — Encounter: Payer: Medicare Other | Admitting: Sports Medicine

## 2022-01-20 ENCOUNTER — Telehealth: Payer: Self-pay | Admitting: Internal Medicine

## 2022-01-20 NOTE — Telephone Encounter (Signed)
Pt would like to speak with Tiffany when she has time to call him

## 2022-01-20 NOTE — Telephone Encounter (Signed)
Lea with [facility] states pt is getting discharged from [facility]. Pt is needing an order for Pt & Aquatic Therapy at [facility]. She states the order the phone # is [phone].

## 2022-01-24 NOTE — Telephone Encounter (Signed)
Sent patient a my chart message response per his request.

## 2022-01-25 ENCOUNTER — Ambulatory Visit: Payer: Medicare Other

## 2022-01-25 ENCOUNTER — Other Ambulatory Visit: Payer: Self-pay

## 2022-01-25 DIAGNOSIS — G629 Polyneuropathy, unspecified: Secondary | ICD-10-CM

## 2022-01-25 DIAGNOSIS — G8929 Other chronic pain: Secondary | ICD-10-CM

## 2022-01-25 NOTE — Telephone Encounter (Signed)
Spoke with someone from [facility]. Faxed the referral to them per their request.

## 2022-01-25 NOTE — Telephone Encounter (Signed)
Placed referral for physical and aquatic therapy at [facility] in [place], [place]

## 2022-01-26 ENCOUNTER — Ambulatory Visit (INDEPENDENT_AMBULATORY_CARE_PROVIDER_SITE_OTHER): Payer: Medicare Other | Admitting: *Deleted

## 2022-01-26 DIAGNOSIS — E538 Deficiency of other specified B group vitamins: Secondary | ICD-10-CM | POA: Diagnosis not present

## 2022-01-26 MED ORDER — CYANOCOBALAMIN 1000 MCG/ML IJ SOLN
1000.0000 ug | Freq: Once | INTRAMUSCULAR | Status: AC
  Administered 2022-01-26: 1000 ug via INTRAMUSCULAR

## 2022-01-26 NOTE — Progress Notes (Signed)
Patient presents for B12 injection today. Patient received her B12 injection in Left  Deltoid. Patient tolerated injection well.  Documentation entered in MAR in [employer].   

## 2022-01-31 ENCOUNTER — Telehealth: Payer: Self-pay

## 2022-01-31 ENCOUNTER — Other Ambulatory Visit: Payer: Self-pay | Admitting: Internal Medicine

## 2022-01-31 DIAGNOSIS — R270 Ataxia, unspecified: Secondary | ICD-10-CM

## 2022-01-31 MED ORDER — CYANOCOBALAMIN 1000 MCG/ML IJ SOLN
1000.0000 ug | INTRAMUSCULAR | Status: DC
  Administered 2022-02-02: 1000 ug via INTRAMUSCULAR

## 2022-01-31 NOTE — Progress Notes (Signed)
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

## 2022-01-31 NOTE — Telephone Encounter (Signed)
Scheduled pt for NV for B12 injection.

## 2022-01-31 NOTE — Telephone Encounter (Signed)
Pt would like to know if he can get his B12 shot done  here because pt stated he live close to here

## 2022-02-01 NOTE — Telephone Encounter (Signed)
Contacted pt to sched NV for B12 injection here at our office per pt request [facility] patient).

## 2022-02-02 ENCOUNTER — Ambulatory Visit (INDEPENDENT_AMBULATORY_CARE_PROVIDER_SITE_OTHER): Payer: Medicare Other

## 2022-02-02 DIAGNOSIS — R270 Ataxia, unspecified: Secondary | ICD-10-CM | POA: Diagnosis not present

## 2022-02-02 DIAGNOSIS — E538 Deficiency of other specified B group vitamins: Secondary | ICD-10-CM

## 2022-02-02 NOTE — Progress Notes (Signed)
..  After obtaining consent, and per orders of Dr. Jon Billings, injection of B12 given by Philipp Deputy. Patient instructed tolerated injection well to remain in clinic for 20 minutes afterwards, and to report any adverse reaction to me immediately.

## 2022-02-04 ENCOUNTER — Other Ambulatory Visit: Payer: Self-pay

## 2022-02-04 DIAGNOSIS — M5442 Lumbago with sciatica, left side: Secondary | ICD-10-CM

## 2022-02-10 DIAGNOSIS — M7918 Myalgia, other site: Secondary | ICD-10-CM | POA: Insufficient documentation

## 2022-02-22 NOTE — Therapy (Signed)
OUTPATIENT PHYSICAL THERAPY THORACOLUMBAR EVALUATION   Patient Name: Brandon Mendoza MRN: [id number] DOB:[birth year], 81 y.o., male Today's Date: 02/23/2022  END OF SESSION:  PT End of Session - 02/23/22 0800     Visit Number 1    Date for PT Re-Evaluation 05/18/22    PT Start Time 0800    PT Stop Time 0840    PT Time Calculation (min) 40 min    Activity Tolerance Patient tolerated treatment well    Behavior During Therapy [facility] for tasks assessed/performed             Past Medical History:  Diagnosis Date   Acquired clawfoot, right foot 12/16/2021   Anxiety    Atrophy of calf muscles 10/21/2021   Duplex calves was negative for significant blockage EMG was done at [facility] MRI lumbar spine was also done at [facility]   Carotid artery occlusion    COPD (chronic obstructive pulmonary disease) (HCC)    Depression    Dysrhythmia    PAF, atrial tachycardia   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   The spinal cord appears normal. 2.   No spinal stenosis. 3.   Mild multilevel degenerative changes as detailed above that do not lead to spinal stenosis or nerve root compression. 4.   T2 hyperintense foci within the pons consistent with chronic microvascular ischemic changes.   Loop - Medtronic Linq 10/22/2020 10/22/2020   Nocturia    Peripheral vascular disease (HCC)    Sleep apnea    on 6 cm, nasal pillow   Stroke (HCC) 05/01/2011   ischemic   Weakness generalized 10/21/2021   Severe, legs and arms   Past Surgical History:  Procedure Laterality Date   CAROTID ENDARTERECTOMY  12/09/11   Left cea   ENDARTERECTOMY  12/09/2011   Procedure: ENDARTERECTOMY CAROTID;  Surgeon: Larina Earthly, MD;  Location: [facility] OR;  Service: Vascular;  Laterality: Left;   EYE  SURGERY     rt retina detachment   HAMMER TOE SURGERY  2004   HERNIA REPAIR  2006   SHOULDER ARTHROSCOPY W/ ROTATOR CUFF REPAIR  2010   TONSILLECTOMY     Patient Active Problem List   Diagnosis Date Noted   Lumbar spondylosis 01/03/2022   Acquired clawfoot, left foot 12/16/2021   Acquired clawfoot, right foot 12/16/2021   Claw foot 12/14/2021   Gait abnormality 12/10/2021   Spinal stenosis at L4-L5 level 12/10/2021   Ataxia 11/22/2021   Lightheadedness 11/02/2021   Claudication of lower extremity (HCC) 10/21/2021   Atrophy of calf muscles 10/21/2021   Weakness generalized 10/21/2021   Focal neurological deficit 10/21/2021   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   Pain in joint of right hip 09/14/2021   High arches 07/22/2021   Hammertoes of both feet 07/22/2021   Neuropathy 07/22/2021   Constipation 05/28/2021   Disequilibrium 05/28/2021   Personal history of colonic polyps 05/28/2021   Renal cyst 05/28/2021   Gastroesophageal reflux disease without esophagitis 05/05/2021   Ingrowing left great toenail 02/15/2021   S/P lumbar fusion 12/24/2020   Spinal stenosis of lumbar region 12/04/2020   Acquired complex renal cyst 11/23/2020   Functional gait abnormality 11/04/2020  Idiopathic neuropathy 11/02/2020   Tinea pedis of both feet 10/28/2020   Loop - Medtronic Linq 10/22/2020 10/22/2020   Benign hypertension with 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) 10/05/2020   Chronic bilateral low back pain with bilateral sciatica 10/05/2020   Fall with injury 10/05/2020   Hematoma 09/16/2020   Normocytic anemia 09/14/2020   AF (paroxysmal atrial fibrillation) (HCC) 09/14/2020   Chronic anticoagulation 09/14/2020   Hyponatremia 09/14/2020   Pain in left foot 01/22/2020   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   Pain in joint of right knee 12/27/2019   Recurrent falls 11/17/2019   Paroxysmal atrial fibrillation (HCC) 10/22/2019   Pain in  right foot 10/14/2019   Falls 10/07/2019   Ataxia involving legs 08/12/2019   OSA on CPAP 06/20/2018   Urge incontinence 04/13/2018   Urinary urgency 04/13/2018   Pain in left knee 03/01/2018   Other intervertebral disc degeneration, lumbar region 06/16/2017   Acquired bilateral hammer toes 10/05/2015   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   Generalized anxiety disorder 05/12/2015   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   History of left-sided carotid endarterectomy 04/10/2014   Essential hypertension 04/10/2014   Occlusion and stenosis of carotid artery without mention of cerebral infarction 12/15/2011   Habitual alcohol use 12/01/2011   Carotid artery stenosis, symptomatic 11/30/2011   Cerebral infarction (HCC) 11/29/2011   Hyperlipidemia     PCP: Glenetta Hewyan Morrison  REFERRING PROVIDER: Glenetta Hewyan Morrison  REFERRING DIAG: M54.42,M54.41,G89.29   Rationale for Evaluation and Treatment: Rehabilitation  THERAPY DIAG:  Repeated falls  Other abnormalities of gait and mobility  Other low back pain  Contracture of muscle, multiple sites  ONSET DATE: 01/25/22  SUBJECTIVE:                                                                                                                                                                                           SUBJECTIVE STATEMENT: I was here a long time ago and then I went [facility] and did PT the first 3 months this year. And then I didn't have PT for a while, I did some PT at home but it is not as good. I fall a lot.   PERTINENT HISTORY:  Cerebral infarct 10/21/21, lumbar fusion, recurrent falls   PAIN:  Are you having pain? Yes: NPRS scale: 1/10 Pain location: low back Pain description: ache  Aggravating factors: being on my feet  Relieving factors: movement  PRECAUTIONS: Fall  WEIGHT BEARING RESTRICTIONS: No  FALLS:   Has patient fallen in last 6 months? Yes.  Number of falls every few days   LIVING ENVIRONMENT: Lives with: lives with their spouse Lives in: House/apartment Stairs: Yes: Internal: 20 steps; on right going up Has following equipment at home: Dan Humphreys - 4 wheeled and trekking pole  OCCUPATION: Retired  PLOF: Independent  PATIENT GOALS: to not fall and go deer hunting again  NEXT MD VISIT:   OBJECTIVE:   DIAGNOSTIC FINDINGS:    LUMBAR SPINE FINDINGS: 1. Wide laminectomy and posterior pedicle screw and rod fixation at L4-5 without residual or recurrent stenosis. 2. Progressive adjacent level disease at L3-4 with moderate right and mild left subarticular narrowing and moderate foraminal stenosis bilaterally. Progression is predominantly due to facet disease 3. Progressive moderate facet hypertrophy at L1-2 with a progressive rightward disc protrusion resulting in progressive moderate right foraminal stenosis. Asymmetric endplate marrow edema on the right at L1-2 is consistent with progressive disease as well. 4. Otherwise stable degenerative change without other focal stenosis.   THORACIC SPINE FINDINGS:  On sagittal views the vertebral bodies have normal height and alignment.  Hemangioma within T9 vertebral body and slightly within T10 vertebral body.  Anterior kyphosis.  Mild scoliosis convex right centered at T8 level. The spinal cord is normal in size and appearance. The paraspinal soft tissues are unremarkable.     On axial views there is no spinal stenosis or foraminal narrowing.  Limited views of the aorta, kidneys, liver, lungs and paraspinal muscles are notable for multiple large right renal cysts ranging in size from 1.5 to 7 cm.   COGNITION: Overall cognitive status: Within functional limits for tasks assessed     SENSATION: WFL  MUSCLE LENGTH: Hamstrings: mod tightness on both sides  POSTURE: rounded shoulders and forward head  LUMBAR ROM:   AROM eval   Flexion Limited due to HS tightness  Extension WFL  Right lateral flexion Tightness in R side  Left lateral flexion WFL  Right rotation WFL  Left rotation Tightness on R side   (Blank rows = not tested)  LOWER EXTREMITY ROM:   grossly WFL    LOWER EXTREMITY MMT:  grossly 4+/5   FUNCTIONAL TESTS:  5 times sit to stand: 20s Timed up and go (TUG): 16s  GAIT: Distance walked: in clinic distances Assistive device utilized:  trekking pole Level of assistance: SBA Comments: ataxic gait pattern, increased lateral sway, decreased safety awareness   TODAY'S TREATMENT:                                                                                                                              DATE: 02/23/22- EVAL    PATIENT EDUCATION:  Education details: HEP and POC Person educated: Patient Education method: Explanation Education comprehension: verbalized understanding  HOME EXERCISE PROGRAM: Marching, 3 way leg kicks, mini squats, heel raises, STS from chair   ASSESSMENT:  CLINICAL IMPRESSION: Patient is a 81 y.o. male who was seen today for physical therapy evaluation and treatment for repeated falls  and balance. He reports repeated falls, falling every few days. He has seen physical therapy on and off for years and has not seen any benefit. Patient has lots of home exercises over the years but is not very consistent with them. He will benefit from skilled PT to decrease his risk for falls and address his low back pain.   OBJECTIVE IMPAIRMENTS: Abnormal gait, decreased balance, decreased coordination, and difficulty walking.   ACTIVITY LIMITATIONS: squatting, stairs, transfers, and locomotion level  REHAB POTENTIAL: Fair dependent on carry over and patient compliance  CLINICAL DECISION MAKING: Stable/uncomplicated  EVALUATION COMPLEXITY: Low  GOALS: Goals reviewed with patient? Yes  SHORT TERM GOALS: Target date: 04/06/22  Patient will be independent with initial  HEP. Goal status: INITIAL  2.  Patient will be educated on strategies to decrease risk of falls.  Goal status: INITIAL   LONG TERM GOALS: Target date: 05/18/22  Patient will be independent with advanced/ongoing HEP to improve outcomes and carryover.  Goal status: INITIAL  2.  Patient will demonstrate decreased fall risk by scoring < 12 sec on TUG. Baseline: 16s Goal status: INITIAL  3.  Patient will demonstrate improved functional LE strength as demonstrated by < 15s on 5xSTS. Baseline: 20s Goal status: INITIAL  4.  Patient will score 42 on Berg Balance test to demonstrate lower risk of falls. (MCID= 8 points) .  Baseline: 36/56 Goal status: INITIAL  5.  Patient will report no falls in a 4 weeks period. Baseline: falls every few days Goal status: INITIAL  PLAN:  PT FREQUENCY: 2x/week  PT DURATION: 12 weeks  PLANNED INTERVENTIONS: Therapeutic exercises, Therapeutic activity, Neuromuscular re-education, Balance training, Gait training, Patient/Family education, Self Care, Joint mobilization, Stair training, Cryotherapy, Moist heat, and Manual therapy.  PLAN FOR NEXT SESSION: balance and LE strengthening   Cassie Freer, PT 02/23/2022, 8:55 AM

## 2022-02-23 ENCOUNTER — Ambulatory Visit: Payer: Medicare Other | Attending: Internal Medicine

## 2022-02-23 DIAGNOSIS — M5441 Lumbago with sciatica, right side: Secondary | ICD-10-CM | POA: Insufficient documentation

## 2022-02-23 DIAGNOSIS — M5459 Other low back pain: Secondary | ICD-10-CM | POA: Diagnosis present

## 2022-02-23 DIAGNOSIS — M5442 Lumbago with sciatica, left side: Secondary | ICD-10-CM | POA: Diagnosis not present

## 2022-02-23 DIAGNOSIS — R296 Repeated falls: Secondary | ICD-10-CM | POA: Insufficient documentation

## 2022-02-23 DIAGNOSIS — R2689 Other abnormalities of gait and mobility: Secondary | ICD-10-CM | POA: Insufficient documentation

## 2022-02-23 DIAGNOSIS — M6249 Contracture of muscle, multiple sites: Secondary | ICD-10-CM | POA: Insufficient documentation

## 2022-02-23 DIAGNOSIS — G8929 Other chronic pain: Secondary | ICD-10-CM | POA: Diagnosis not present

## 2022-03-01 ENCOUNTER — Ambulatory Visit (INDEPENDENT_AMBULATORY_CARE_PROVIDER_SITE_OTHER): Payer: Medicare Other | Admitting: Internal Medicine

## 2022-03-01 ENCOUNTER — Encounter: Payer: Self-pay | Admitting: Physical Therapy

## 2022-03-01 ENCOUNTER — Ambulatory Visit: Payer: Medicare Other | Attending: Internal Medicine | Admitting: Physical Therapy

## 2022-03-01 ENCOUNTER — Encounter: Payer: Self-pay | Admitting: Internal Medicine

## 2022-03-01 VITALS — BP 110/78 | HR 68 | Temp 97.2°F | Resp 12 | Ht 69.0 in | Wt 189.6 lb

## 2022-03-01 DIAGNOSIS — R2689 Other abnormalities of gait and mobility: Secondary | ICD-10-CM | POA: Diagnosis present

## 2022-03-01 DIAGNOSIS — R296 Repeated falls: Secondary | ICD-10-CM | POA: Diagnosis present

## 2022-03-01 DIAGNOSIS — E7800 Pure hypercholesterolemia, unspecified: Secondary | ICD-10-CM | POA: Diagnosis not present

## 2022-03-01 DIAGNOSIS — H3530 Unspecified macular degeneration: Secondary | ICD-10-CM | POA: Diagnosis not present

## 2022-03-01 DIAGNOSIS — Z0000 Encounter for general adult medical examination without abnormal findings: Secondary | ICD-10-CM

## 2022-03-01 DIAGNOSIS — Z9189 Other specified personal risk factors, not elsewhere classified: Secondary | ICD-10-CM

## 2022-03-01 DIAGNOSIS — M6249 Contracture of muscle, multiple sites: Secondary | ICD-10-CM | POA: Insufficient documentation

## 2022-03-01 DIAGNOSIS — M5459 Other low back pain: Secondary | ICD-10-CM | POA: Diagnosis present

## 2022-03-01 LAB — URINALYSIS, ROUTINE W REFLEX MICROSCOPIC
Bilirubin Urine: NEGATIVE
Hgb urine dipstick: NEGATIVE
Ketones, ur: NEGATIVE
Leukocytes,Ua: NEGATIVE
Nitrite: NEGATIVE
RBC / HPF: NONE SEEN (ref 0–?)
Specific Gravity, Urine: 1.02 (ref 1.000–1.030)
Total Protein, Urine: NEGATIVE
Urine Glucose: NEGATIVE
Urobilinogen, UA: 1 (ref 0.0–1.0)
WBC, UA: NONE SEEN (ref 0–?)
pH: 6 (ref 5.0–8.0)

## 2022-03-01 MED ORDER — ROSUVASTATIN CALCIUM 20 MG PO TABS: 10.0000 mg | ORAL_TABLET | Freq: Every day | ORAL | 3 refills | Status: DC

## 2022-03-01 MED ORDER — SERTRALINE HCL 50 MG PO TABS: 50.0000 mg | ORAL_TABLET | Freq: Every day | ORAL | 3 refills | Status: DC

## 2022-03-01 NOTE — Patient Instructions (Signed)
Preventive Care 65 Years and Older, Male Preventive care refers to lifestyle choices and visits with your health care provider that can promote health and wellness. Preventive care visits are also called wellness exams. What can I expect for my preventive care visit? Counseling During your preventive care visit, your health care provider may ask about your: Medical history, including: Past medical problems. Family medical history. History of falls. Current health, including: Emotional well-being. Home life and relationship well-being. Sexual activity. Memory and ability to understand (cognition). Lifestyle, including: Alcohol, nicotine or tobacco, and drug use. Access to firearms. Diet, exercise, and sleep habits. Work and work environment. Sunscreen use. Safety issues such as seatbelt and bike helmet use. Physical exam Your health care provider will check your: Height and weight. These may be used to calculate your BMI (body mass index). BMI is a measurement that tells if you are at a healthy weight. Waist circumference. This measures the distance around your waistline. This measurement also tells if you are at a healthy weight and may help predict your risk of certain diseases, such as type 2 diabetes and high blood pressure. Heart rate and blood pressure. Body temperature. Skin for abnormal spots. What immunizations do I need?  Vaccines are usually given at various ages, according to a schedule. Your health care provider will recommend vaccines for you based on your age, medical history, and lifestyle or other factors, such as travel or where you work. What tests do I need? Screening Your health care provider may recommend screening tests for certain conditions. This may include: Lipid and cholesterol levels. Diabetes screening. This is done by checking your blood sugar (glucose) after you have not eaten for a while (fasting). Hepatitis C test. Hepatitis B test. HIV (human  immunodeficiency virus) test. STI (sexually transmitted infection) testing, if you are at risk. Lung cancer screening. Colorectal cancer screening. Prostate cancer screening. Abdominal aortic aneurysm (AAA) screening. You may need this if you are a current or former smoker. Talk with your health care provider about your test results, treatment options, and if necessary, the need for more tests. Follow these instructions at home: Eating and drinking  Eat a diet that includes fresh fruits and vegetables, whole grains, lean protein, and low-fat dairy products. Limit your intake of foods with high amounts of sugar, saturated fats, and salt. Take vitamin and mineral supplements as recommended by your health care provider. Do not drink alcohol if your health care provider tells you not to drink. If you drink alcohol: Limit how much you have to 0-2 drinks a day. Know how much alcohol is in your drink. In the [place] one drink equals one 12 oz bottle of beer (355 mL), one 5 oz glass of wine (148 mL), or one 1 oz glass of hard liquor (44 mL). Lifestyle Brush your teeth every morning and night with fluoride toothpaste. Floss one time each day. Exercise for at least 30 minutes 5 or more days each week. Do not use any products that contain nicotine or tobacco. These products include cigarettes, chewing tobacco, and vaping devices, such as e-cigarettes. If you need help quitting, ask your health care provider. Do not use drugs. If you are sexually active, practice safe sex. Use a condom or other form of protection to prevent STIs. Take aspirin only as told by your health care provider. Make sure that you understand how much to take and what form to take. Work with your health care provider to find out whether it is safe   and beneficial for you to take aspirin daily. Ask your health care provider if you need to take a cholesterol-lowering medicine (statin). Find healthy ways to manage stress, such  as: Meditation, yoga, or listening to music. Journaling. Talking to a trusted person. Spending time with friends and family. Safety Always wear your seat belt while driving or riding in a vehicle. Do not drive: If you have been drinking alcohol. Do not ride with someone who has been drinking. When you are tired or distracted. While texting. If you have been using any mind-altering substances or drugs. Wear a helmet and other protective equipment during sports activities. If you have firearms in your house, make sure you follow all gun safety procedures. Minimize exposure to UV radiation to reduce your risk of skin cancer. What's next? Visit your health care provider once a year for an annual wellness visit. Ask your health care provider how often you should have your eyes and teeth checked. Stay up to date on all vaccines. This information is not intended to replace advice given to you by your health care provider. Make sure you discuss any questions you have with your health care provider. Document Revised: 09/09/2020 Document Reviewed: 09/09/2020 [employer]vier Patient Education  2023 [employer]  

## 2022-03-01 NOTE — Progress Notes (Signed)
[facility] Provider:    Loralee Pacas, MD  Date:       03/01/2022   Chief Complaint:  Brandon Mendoza is a 81 y.o. male who presents today for his annual comprehensive physical exam and subsequent medicare annual wellness visit.    Assessment/Plan:   Health maintenance AVS provided  Brynn was seen today for annual exam, discuss medications, diplopia and drooling when talking.  Preventative health care -     Urinalysis, Routine w reflex microscopic; Future  Pure hypercholesterolemia -     Rosuvastatin Calcium; Take 0.5 tablets (10 mg total) by mouth at bedtime.  Dispense: 90 tablet; Refill: 3  History of anxiety state -     Sertraline HCl; Take 1 tablet (50 mg total) by mouth daily. Use to taper off sertraline slowly over several months, if tolerated. Be aware anxiety and irritability may increase and you might have to stay on this  Dispense: 90 tablet; Refill: 3  Macular degeneration of right eye, unspecified type Overview: He follows with Dr. Marica Otter for his macular degeneration had history of of laser treatment and still cannot see all of the telephone pole and when he turns his head to the right gets double vision but the procedure was done ~2000 and it does not seem like its gotten any worse or better   Preventative Healthcare:   Patient Counseling(The following topics were reviewed and/or handout was given):  -Nutrition: Stressed importance of moderation in sodium/caffeine intake, saturated fat and cholesterol, caloric balance, sufficient intake of fresh fruits, vegetables, and fiber. Encouraged him to add EVOO in particular to his diet he currently does not.  Encouraged weight loss as well as he has truncal obesity  -Stressed the importance of regular exercise focusing upper body strength due to he already does physical therapy for le weakness and recurrent falls.  -Substance Abuse: Discussed cessation/primary prevention of tobacco, alcohol, or other drug use;  driving or other dangerous activities under the influence; availability of treatment for abuse.    -Injury prevention: Discussed safety belts, safety helmets, smoke detector, smoking near bedding or upholstery.   -Sexuality: he is monogamous in long term marriage  -Dental health: Discussed importance of regular tooth brushing, flossing, and dental visits.  -Health maintenance and immunizations reviewed. Please refer to Health maintenance section.  During the course of the visit the patient was educated and counseled about appropriate screening and preventive services including:        Fall prevention - explained we have done all we can do on this and encouraged building upper extremity strength along with lower extremity strength  at physical therapy   Nutrition Physical Activity encouraged low fall risk exercise like resistance bands, rowing Weight Management enc'd weight weight loss. Cognition:  he scored perfectly on cognition and no focal deficits were observed    03/01/2022   10:27 AM  6CIT Screen  What Year? 0 points  What month? 0 points  What time? 0 points  Count back from 20 0 points  Months in reverse 0 points  Repeat phrase 0 points  Total Score 0 points      Return to care in 1 year for next preventative visit.       Subjective:  HPI:  Health Risk Assessment: Patient considers their overall health to be good except for falls- feels low risk for cardiac issues and fasting lipid profile is really good The patient reports no difficulty performing the following:  Preparing food and eating  Bathing  Getting dressed Using the toilet Shopping Managing Finances  He does have difficulty moving around from place to place but he has done everything possible to reduce fall risk He has had innumerable falls within the past year he is doing physical therapy.     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  Exercise: does physical therapy but limited on walking.  We reviewed these medications and they were reconciled and we tried to lower the Zoloft 100 to 50 mg daily to try to reduce medications and I wanted him to resume rosuvastatin.  Also duloxetine 30 isn't listed here but I encouraged him to continue it.  It was added for pain and stiffness which he only has in mornings. Outpatient Medications Prior to Visit  Medication Sig   ALPRAZolam (XANAX) 0.25 MG tablet 1 tablet (0.25 mg total) by Per NG tube route daily as needed for anxiety.   Ascorbic Acid (VITAMIN C) 1000 MG tablet Take 500 mg by mouth daily.   aspirin 81 MG chewable tablet Chew 1 tablet by mouth daily.   flecainide (TAMBOCOR) 100 MG tablet Take 1 tablet every 12 hours by oral route.   losartan-hydrochlorothiazide (HYZAAR) 50-12.5 MG tablet Take 1 tablet by mouth every morning.   meclizine (ANTIVERT) 12.5 MG tablet as needed. As needed.   Melatonin 5 MG CHEW Chew 5 mg by mouth at bedtime as needed.   meloxicam (MOBIC) 15 MG tablet As needed.   MULTIPLE VITAMIN PO Take 1 tablet by mouth daily.   [DISCONTINUED] rosuvastatin (CRESTOR) 20 MG tablet Take 10 mg by mouth at bedtime.   [DISCONTINUED] sertraline (ZOLOFT) 100 MG tablet Take 1 tablet (100 mg total) by mouth daily.   [DISCONTINUED] diclofenac (VOLTAREN) 75 MG EC tablet Take 1 tablet by mouth as needed. (Patient not taking: Reported on 03/01/2022)   [DISCONTINUED] cyanocobalamin (VITAMIN B12) injection 1,000 mcg    No facility-administered medications prior to visit.      Patient Care Team: Lula Olszewski, MD as PCP - General (Internal Medicine) Dohmeier, Porfirio Mylar, MD as Consulting Physician (Neurology) Alejandro Mulling, RN as [facility] Care Management Alejandro Mulling, RN as [facility] Care Management   Review of Systems  Constitutional:  Positive for malaise/fatigue (in ams). Negative for chills, diaphoresis, fever and weight loss.  HENT:  Negative for congestion, ear discharge, ear pain, hearing loss, nosebleeds, sinus pain, sore throat and tinnitus.   Eyes:  Positive for double vision (see macular degen). Negative for blurred vision, photophobia, pain, discharge and redness.  Respiratory:  Negative for cough, hemoptysis, sputum production, shortness of breath, wheezing and stridor.   Cardiovascular:  Negative for chest pain, palpitations, orthopnea, claudication, leg swelling and PND.  Gastrointestinal:  Negative for abdominal pain, blood in stool, constipation, diarrhea, heartburn, melena, nausea and vomiting.  Genitourinary:  Negative for dysuria, flank pain, frequency, hematuria and urgency.  Musculoskeletal:  Positive for back pain (chronic) and myalgias. Negative for falls, joint pain and neck pain.  Skin:  Negative for itching and rash.  Neurological:  Negative for dizziness, tingling, tremors, sensory change, speech change, focal weakness, seizures, loss of consciousness, weakness and headaches.  Endo/Heme/Allergies:  Negative  for environmental allergies and polydipsia. Bruises/bleeds easily (on asa and falls).  Psychiatric/Behavioral:  Positive for memory loss (ppls names). Negative for depression, hallucinations, substance abuse (down to one cocktail nightly.) and suicidal ideas. The patient is not nervous/anxious and does not have insomnia.     The drueling and diplopia seem secondary to procedure he underwent for macular degen years ago     03/01/2022   10:27 AM  6CIT Screen  What Year? 0 points  What month? 0 points  What time? 0 points  Count back from 20 0 points  Months in reverse 0 points  Repeat phrase 0 points  Total Score 0 points         Objective:  Physical Exam: BP 110/78 (BP Location: Left Arm, Patient Position: Sitting)    Pulse 68   Temp (!) 97.2 F (36.2 C) (Temporal)   Resp 12   Ht 5\' 9"  (1.753 m)   Wt 189 lb 9.6 oz (86 kg)   SpO2 93%   BMI 28.00 kg/m   Body mass index is 28 kg/m. Wt Readings from Last 10 Encounters:  03/01/22 189 lb 9.6 oz (86 kg)  01/04/22 184 lb (83.5 kg)  12/22/21 183 lb 12.8 oz (83.4 kg)  12/14/21 182 lb (82.6 kg)  12/10/21 183 lb (83 kg)  11/22/21 182 lb (82.6 kg)  10/21/21 179 lb 12.8 oz (81.6 kg)  07/22/21 187 lb (84.8 kg)  07/19/21 190 lb (86.2 kg)  06/15/21 183 lb 14.4 oz (83.4 kg)  We reviewed together and I encouraged weight loss and exercise.  Gen: This is a polite, friendly, and genuine person who was a pleasure to meet.  Constitutional: NAD, AAO, not ill-appearing, resting comfortably  HEENT: TMs normal bilaterally. OP clear. No thyromegaly noted.  CV: RRR with no murmurs appreciated Pulm: NWOB, CTAB with no crackles, wheezes, or rhonchi GI: Normal bowel sounds present. Soft, Nontender, Nondistended. MSK: no edema, cyanosis, or clubbing noted Skin: warm, dry Neuro: CN2-12 grossly intact. Strength 5/5 in upper and lower extremities. Reflexes symmetric and intact bilaterally. Normal minicog with 3/3 delayed word recall.  Psych: Normal affect and thought content  Requires cane, time up and go.  20/40 bilateral finger rubs heard at 3 feet good vision and hearing by gross testing mechanisms.

## 2022-03-01 NOTE — Therapy (Signed)
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   The spinal cord appears normal. 2.   No spinal stenosis. 3.   Mild multilevel degenerative changes as detailed above that do not lead to spinal stenosis or nerve root compression. 4.   T2 hyperintense foci within the pons consistent with chronic microvascular ischemic changes.   Loop - Medtronic Linq 10/22/2020 10/22/2020   Nocturia    Peripheral vascular disease (HCC)    Sleep apnea    on 6 cm, nasal pillow   Stroke (HCC) 05/01/2011   ischemic   Weakness generalized 10/21/2021   Severe, legs and arms   Past Surgical History:  Procedure Laterality Date   CAROTID ENDARTERECTOMY  12/09/11   Left cea   ENDARTERECTOMY  12/09/2011   Procedure: ENDARTERECTOMY CAROTID;  Surgeon: Larina Earthlyodd F Early, MD;  [facility];  Service: Vascular;  Laterality: Left;   EYE SURGERY      rt retina detachment   HAMMER TOE SURGERY  2004   HERNIA REPAIR  2006   SHOULDER ARTHROSCOPY W/ ROTATOR CUFF REPAIR  2010   TONSILLECTOMY     Patient Active Problem List   Diagnosis Date Noted   Macular degeneration 03/01/2022   Myofascial pain 02/10/2022   Lumbar spondylosis 01/03/2022   Acquired clawfoot, left foot 12/16/2021   Acquired clawfoot, right foot 12/16/2021   Claw foot 12/14/2021   Gait abnormality 12/10/2021   Spinal stenosis at L4-L5 level 12/10/2021   Ataxia 11/22/2021   Lightheadedness 11/02/2021   Claudication of lower extremity (HCC) 10/21/2021   Atrophy of calf muscles 10/21/2021   Weakness generalized 10/21/2021   Focal neurological deficit 10/21/2021   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   Pain in joint of right hip 09/14/2021   High arches 07/22/2021   Hammertoes of both feet 07/22/2021   Neuropathy 07/22/2021   Constipation 05/28/2021   Disequilibrium 05/28/2021   Personal history of colonic polyps 05/28/2021   Renal cyst 05/28/2021   Gastroesophageal reflux disease without esophagitis 05/05/2021   Ingrowing left great toenail 02/15/2021   S/P lumbar fusion 12/24/2020   Spinal stenosis of lumbar region 12/04/2020   Acquired  complex renal cyst 11/23/2020   Functional gait abnormality 11/04/2020   Idiopathic neuropathy 11/02/2020   Tinea pedis of both feet 10/28/2020   Loop - Medtronic Linq 10/22/2020 10/22/2020   Benign hypertension with 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) 10/05/2020   Fall with injury 10/05/2020   Hematoma 09/16/2020   Normocytic anemia 09/14/2020   AF (paroxysmal atrial fibrillation) (HCC) 09/14/2020   Chronic anticoagulation 09/14/2020   Hyponatremia 09/14/2020   Pain in left foot 01/22/2020   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   Pain in joint of right knee 12/27/2019   Recurrent falls 11/17/2019   Paroxysmal atrial fibrillation (HCC) 10/22/2019   Pain in right foot  10/14/2019   Falls 10/07/2019   Ataxia involving legs 08/12/2019   OSA on CPAP 06/20/2018   Urge incontinence 04/13/2018   Urinary urgency 04/13/2018   Pain in left knee 03/01/2018   Other intervertebral disc degeneration, lumbar region 06/16/2017   Acquired bilateral hammer toes 10/05/2015   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   Generalized anxiety disorder 05/12/2015   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   History of left-sided carotid endarterectomy 04/10/2014   Essential hypertension 04/10/2014   Occlusion and stenosis of carotid artery without mention of cerebral infarction 12/15/2011   Habitual alcohol use 12/01/2011   Carotid artery stenosis, symptomatic 11/30/2011   Cerebral infarction (HCC) 11/29/2011   Hyperlipidemia     PCP: Glenetta Hew  REFERRING PROVIDER: Glenetta Hew  REFERRING DIAG: M54.42,M54.41,G89.29   Rationale for Evaluation and Treatment: Rehabilitation  THERAPY DIAG:  Repeated falls  Other low back pain  Other abnormalities of gait and mobility  ONSET DATE: 01/25/22  SUBJECTIVE:                                                                                                                                                                                           SUBJECTIVE STATEMENT: "My issues is I fall all the time"   PERTINENT HISTORY:  Cerebral infarct 10/21/21, lumbar fusion, recurrent falls   PAIN:  Are you having pain? Yes: NPRS scale: 1/10 Pain location: low back Pain description: ache  Aggravating factors: being on my feet  Relieving factors: movement  PRECAUTIONS: Fall  WEIGHT BEARING RESTRICTIONS: No  FALLS:  Has patient fallen in last 6 months? Yes. Number of falls every few days   LIVING ENVIRONMENT: Lives with: lives with their spouse Lives in: House/apartment Stairs: Yes: Internal: 20 steps; on right going  up Has following equipment at home: Dan Humphreys - 4 wheeled and trekking pole  OCCUPATION: Retired  PLOF: Independent  PATIENT GOALS: to not fall and go deer hunting again  NEXT MD VISIT:   OBJECTIVE:   DIAGNOSTIC FINDINGS:    LUMBAR SPINE FINDINGS: 1. Wide laminectomy and posterior pedicle screw and rod fixation at L4-5 without residual or recurrent stenosis. 2. Progressive adjacent level disease at L3-4 with moderate right and mild left subarticular narrowing and moderate foraminal stenosis bilaterally. Progression is predominantly due to facet disease 3. Progressive moderate facet hypertrophy at L1-2 with a progressive rightward disc protrusion resulting in progressive moderate right foraminal stenosis. Asymmetric endplate marrow edema on the right at L1-2 is consistent with progressive disease as well. 4. Otherwise stable degenerative change without other focal stenosis.   THORACIC SPINE FINDINGS:  On sagittal views the vertebral bodies have normal height and alignment.  Hemangioma within T9 vertebral body and slightly within T10 vertebral body.  Anterior kyphosis.  Mild scoliosis convex right centered at T8 level. The spinal cord is normal in size and appearance. The paraspinal soft tissues are unremarkable.     On axial views there is no spinal stenosis or foraminal narrowing.  Limited views of the aorta, kidneys, liver, lungs and paraspinal muscles are notable for multiple large right renal cysts ranging in size from 1.5 to 7 cm.   COGNITION: Overall cognitive status: Within functional limits for tasks assessed     SENSATION: WFL  MUSCLE LENGTH: Hamstrings: mod tightness on both sides  POSTURE: rounded shoulders and forward head  LUMBAR ROM:   AROM eval  Flexion Limited due to HS tightness  Extension WFL  Right lateral flexion Tightness in R side  Left lateral flexion WFL  Right rotation WFL  Left rotation Tightness on R side   (Blank rows = not  tested)  LOWER EXTREMITY ROM:   grossly WFL    LOWER EXTREMITY MMT:  grossly 4+/5   FUNCTIONAL TESTS:  5 times sit to stand: 20s Timed up and go (TUG): 16s  GAIT: Distance walked: in clinic distances Assistive device utilized:  trekking pole Level of assistance: SBA Comments: ataxic gait pattern, increased lateral sway, decreased safety awareness   TODAY'S TREATMENT:                                                                                                                              DATE:  03/01/22  NuStep L5  6 min S2S holding yellow ball 2x10 Seated HS curls 25lb 2x12 Leg Ext 10lb 2x10 Alt 6 in box taps frequent LOB posteriorly  Hip add ball squeeze 2x10 Standing ball toss frequent LOB Backwards walking    02/23/22- EVAL    PATIENT EDUCATION:  Education details: HEP and POC Person educated: Patient Education method: Explanation Education comprehension: verbalized understanding  HOME EXERCISE PROGRAM: Marching, 3 way leg kicks, mini squats, heel raises, STS from chair   ASSESSMENT:  CLINICAL IMPRESSION: Patient is a 81 y.o. male who was seen today for physical therapy treatment. He enters feeling  well. Cues for sequencing and eccentric control with sit to stands. Pt had difficulty with resisted side step, alt box taps, and ball toss losing balance frequently. Pt tends to loose his balance posteriorly taking steps to recover. Very little trunk lean noted with standing ball toss.    OBJECTIVE IMPAIRMENTS: Abnormal gait, decreased balance, decreased coordination, and difficulty walking.   ACTIVITY LIMITATIONS: squatting, stairs, transfers, and locomotion level  REHAB POTENTIAL: Fair dependent on carry over and patient compliance  CLINICAL DECISION MAKING: Stable/uncomplicated  EVALUATION COMPLEXITY: Low  GOALS: Goals reviewed with patient? Yes  SHORT TERM GOALS: Target date: 04/06/22  Patient will be independent with initial HEP. Goal status:  INITIAL  2.  Patient will be educated on strategies to decrease risk of falls.  Goal status: INITIAL   LONG TERM GOALS: Target date: 05/18/22  Patient will be independent with advanced/ongoing HEP to improve outcomes and carryover.  Goal status: INITIAL  2.  Patient will demonstrate decreased fall risk by scoring < 12 sec on TUG. Baseline: 16s Goal status: INITIAL  3.  Patient will demonstrate improved functional LE strength as demonstrated by < 15s on 5xSTS. Baseline: 20s Goal status: INITIAL  4.  Patient will score 42 on Berg Balance test to demonstrate lower risk of falls. (MCID= 8 points) .  Baseline: 36/56 Goal status: INITIAL  5.  Patient will report no falls in a 4 weeks period. Baseline: falls every few days Goal status: INITIAL  PLAN:  PT FREQUENCY: 2x/week  PT DURATION: 12 weeks  PLANNED INTERVENTIONS: Therapeutic exercises, Therapeutic activity, Neuromuscular re-education, Balance training, Gait training, Patient/Family education, Self Care, Joint mobilization, Stair training, Cryotherapy, Moist heat, and Manual therapy.  PLAN FOR NEXT SESSION: balance and LE strengthening   Grayce Sessions, PTA 03/01/2022, 3:17 PM

## 2022-03-02 ENCOUNTER — Encounter: Payer: Self-pay | Admitting: Internal Medicine

## 2022-03-04 ENCOUNTER — Ambulatory Visit: Payer: Medicare Other | Admitting: Physical Therapy

## 2022-03-04 ENCOUNTER — Encounter: Payer: Self-pay | Admitting: Physical Therapy

## 2022-03-04 DIAGNOSIS — M5459 Other low back pain: Secondary | ICD-10-CM

## 2022-03-04 DIAGNOSIS — R296 Repeated falls: Secondary | ICD-10-CM

## 2022-03-04 DIAGNOSIS — R2689 Other abnormalities of gait and mobility: Secondary | ICD-10-CM

## 2022-03-04 NOTE — Therapy (Signed)
OUTPATIENT PHYSICAL THERAPY THORACOLUMBAR TREATMENT   Patient Name: Brandon Mendoza MRN: [id number] DOB:1940/05/13, 81 y.o., male Today's Date: 03/04/2022  END OF SESSION:  PT End of Session - 03/04/22 0924     Visit Number 3    Date for PT Re-Evaluation 05/18/22    PT Start Time 0925    PT Stop Time 1010    PT Time Calculation (min) 45 min    Activity Tolerance Patient tolerated treatment well    Behavior During Therapy [facility] for tasks assessed/performed             Past Medical History:  Diagnosis Date   Acquired clawfoot, right foot 12/16/2021   Anxiety    Atrophy of calf muscles 10/21/2021   Duplex calves was negative for significant blockage [facility] done at [facility] MRI lumbar spine was also done at [facility]   Carotid artery occlusion    COPD (chronic obstructive pulmonary disease) (HCC)    Depression    Dysrhythmia    PAF, atrial tachycardia   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   The spinal cord appears normal. 2.   No spinal stenosis. 3.   Mild multilevel degenerative changes as detailed above that do not lead to spinal stenosis or nerve root compression. 4.   T2 hyperintense foci within the pons consistent with chronic microvascular ischemic changes.   Loop - Medtronic Linq 10/22/2020 10/22/2020   Nocturia    Peripheral vascular disease (HCC)    Sleep apnea    on 6 cm, nasal pillow   Stroke (HCC) 05/01/2011   ischemic   Weakness generalized 10/21/2021   Severe, legs and arms   Past Surgical History:  Procedure Laterality Date   CAROTID ENDARTERECTOMY  12/09/11   Left cea   ENDARTERECTOMY  12/09/2011   Procedure: ENDARTERECTOMY CAROTID;  Surgeon: Larina Earthly, MD;  Location: [facility] OR;  Service: Vascular;  Laterality: Left;   EYE SURGERY      rt retina detachment   HAMMER TOE SURGERY  2004   HERNIA REPAIR  2006   SHOULDER ARTHROSCOPY W/ ROTATOR CUFF REPAIR  2010   TONSILLECTOMY     Patient Active Problem List   Diagnosis Date Noted   Macular degeneration 03/01/2022   Myofascial pain 02/10/2022   Lumbar spondylosis 01/03/2022   Acquired clawfoot, left foot 12/16/2021   Acquired clawfoot, right foot 12/16/2021   Claw foot 12/14/2021   Gait abnormality 12/10/2021   Spinal stenosis at L4-L5 level 12/10/2021   Ataxia 11/22/2021   Lightheadedness 11/02/2021   Claudication of lower extremity (HCC) 10/21/2021   Atrophy of calf muscles 10/21/2021   Weakness generalized 10/21/2021   Focal neurological deficit 10/21/2021   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   Pain in joint of right hip 09/14/2021   High arches 07/22/2021   Hammertoes of both feet 07/22/2021   Neuropathy 07/22/2021   Constipation 05/28/2021   Disequilibrium 05/28/2021   Personal history of colonic polyps 05/28/2021   Renal cyst 05/28/2021   Gastroesophageal reflux disease without esophagitis 05/05/2021   Ingrowing left great toenail 02/15/2021   S/P lumbar fusion 12/24/2020   Spinal stenosis of lumbar region 12/04/2020   Acquired  complex renal cyst 11/23/2020   Functional gait abnormality 11/04/2020   Idiopathic neuropathy 11/02/2020   Tinea pedis of both feet 10/28/2020   Loop - Medtronic Linq 10/22/2020 10/22/2020   Benign hypertension with 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) 10/05/2020   Fall with injury 10/05/2020   Hematoma 09/16/2020   Normocytic anemia 09/14/2020   AF (paroxysmal atrial fibrillation) (HCC) 09/14/2020   Chronic anticoagulation 09/14/2020   Hyponatremia 09/14/2020   Pain in left foot 01/22/2020   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   Pain in joint of right knee 12/27/2019   Recurrent falls 11/17/2019   Paroxysmal atrial fibrillation (HCC) 10/22/2019   Pain in right foot  10/14/2019   Falls 10/07/2019   Ataxia involving legs 08/12/2019   OSA on CPAP 06/20/2018   Urge incontinence 04/13/2018   Urinary urgency 04/13/2018   Pain in left knee 03/01/2018   Other intervertebral disc degeneration, lumbar region 06/16/2017   Acquired bilateral hammer toes 10/05/2015   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   Generalized anxiety disorder 05/12/2015   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   History of left-sided carotid endarterectomy 04/10/2014   Essential hypertension 04/10/2014   Occlusion and stenosis of carotid artery without mention of cerebral infarction 12/15/2011   Habitual alcohol use 12/01/2011   Carotid artery stenosis, symptomatic 11/30/2011   Cerebral infarction (HCC) 11/29/2011   Hyperlipidemia     PCP: Glenetta Hewyan Morrison  REFERRING PROVIDER: Glenetta Hewyan Morrison  REFERRING DIAG: M54.42,M54.41,G89.29   Rationale for Evaluation and Treatment: Rehabilitation  THERAPY DIAG:  Repeated falls  Other low back pain  Other abnormalities of gait and mobility  ONSET DATE: 01/25/22  SUBJECTIVE:                                                                                                                                                                                           SUBJECTIVE STATEMENT: "Im all right, Im still unsteady"  PERTINENT HISTORY:  Cerebral infarct 10/21/21, lumbar fusion, recurrent falls   PAIN:  Are you having pain? Yes: NPRS scale: 0/10 Pain location: low back Pain description: ache  Aggravating factors: being on my feet  Relieving factors: movement  PRECAUTIONS: Fall  WEIGHT BEARING RESTRICTIONS: No  FALLS:  Has patient fallen in last 6 months? Yes. Number of falls every few days   LIVING ENVIRONMENT: Lives with: lives with their spouse Lives in: House/apartment Stairs: Yes: Internal: 20 steps; on right going  up Has following equipment at home: Dan HumphreysWalker - 4 wheeled and trekking pole  OCCUPATION: Retired  PLOF: Independent  PATIENT GOALS: to not fall and go deer hunting again  NEXT MD VISIT:   OBJECTIVE:   DIAGNOSTIC FINDINGS:    LUMBAR SPINE FINDINGS: 1. Wide laminectomy and posterior pedicle screw and rod fixation at L4-5 without residual or recurrent stenosis. 2. Progressive adjacent level disease at L3-4 with moderate right and mild left subarticular narrowing and moderate foraminal stenosis bilaterally. Progression is predominantly due to facet disease 3. Progressive moderate facet hypertrophy at L1-2 with a progressive rightward disc protrusion resulting in progressive moderate right foraminal stenosis. Asymmetric endplate marrow edema on the right at L1-2 is consistent with progressive disease as well. 4. Otherwise stable degenerative change without other focal stenosis.   THORACIC SPINE FINDINGS:  On sagittal views the vertebral bodies have normal height and alignment.  Hemangioma within T9 vertebral body and slightly within T10 vertebral body.  Anterior kyphosis.  Mild scoliosis convex right centered at T8 level. The spinal cord is normal in size and appearance. The paraspinal soft tissues are unremarkable.     On axial views there is no spinal stenosis or foraminal narrowing.  Limited views of the aorta, kidneys, liver, lungs and paraspinal muscles are notable for multiple large right renal cysts ranging in size from 1.5 to 7 cm.   COGNITION: Overall cognitive status: Within functional limits for tasks assessed     SENSATION: WFL  MUSCLE LENGTH: Hamstrings: mod tightness on both sides  POSTURE: rounded shoulders and forward head  LUMBAR ROM:   AROM eval  Flexion Limited due to HS tightness  Extension WFL  Right lateral flexion Tightness in R side  Left lateral flexion WFL  Right rotation WFL  Left rotation Tightness on R side   (Blank rows = not  tested)  LOWER EXTREMITY ROM:   grossly WFL    LOWER EXTREMITY MMT:  grossly 4+/5   FUNCTIONAL TESTS:  5 times sit to stand: 20s Timed up and go (TUG): 16s  GAIT: Distance walked: in clinic distances Assistive device utilized:  trekking pole Level of assistance: SBA Comments: ataxic gait pattern, increased lateral sway, decreased safety awareness   TODAY'S TREATMENT:                                                                                                                              DATE:  03/04/22 NuStep L5 x 6 min Alt 6 & 4 in box taps with and without trekking pole frequent LOB posteriorly  Resisted gait 20lb forward/backward x4, side step x3 Leg press 60lb 4x15 "Pt requested weight" Seated HS curls 45lb 2x10 Leg Ext 10lb 2x10 S2S 2x10   03/01/22  NuStep L5  6 min S2S holding yellow ball 2x10 Seated HS curls 25lb 2x12 Leg Ext 10lb 2x10 Alt 6 in box taps frequent LOB posteriorly  Hip add ball squeeze 2x10 Standing ball toss frequent LOB Backwards walking    02/23/22- EVAL    PATIENT EDUCATION:  Education details: HEP and POC Person educated:  Patient Education method: Explanation Education comprehension: verbalized understanding  HOME EXERCISE PROGRAM: Marching, 3 way leg kicks, mini squats, heel raises, STS from chair   ASSESSMENT:  CLINICAL IMPRESSION: Patient is a 81 y.o. male who was seen today for physical therapy treatment. He enters feeling well. Continues to have balance issues losing his balance posteriorly with alt march, sit to stands, and standing march. Pt insisted on increasing the weight on all machines and he did do those well. Cues at times needed to concentrate with interventions.    OBJECTIVE IMPAIRMENTS: Abnormal gait, decreased balance, decreased coordination, and difficulty walking.   ACTIVITY LIMITATIONS: squatting, stairs, transfers, and locomotion level  REHAB POTENTIAL: Fair dependent on carry over and patient  compliance  CLINICAL DECISION MAKING: Stable/uncomplicated  EVALUATION COMPLEXITY: Low  GOALS: Goals reviewed with patient? Yes  SHORT TERM GOALS: Target date: 04/06/22  Patient will be independent with initial HEP. Goal status: on ging  2.  Patient will be educated on strategies to decrease risk of falls.  Goal status: INITIAL   LONG TERM GOALS: Target date: 05/18/22  Patient will be independent with advanced/ongoing HEP to improve outcomes and carryover.  Goal status: INITIAL  2.  Patient will demonstrate decreased fall risk by scoring < 12 sec on TUG. Baseline: 16s Goal status: INITIAL  3.  Patient will demonstrate improved functional LE strength as demonstrated by < 15s on 5xSTS. Baseline: 20s Goal status: INITIAL  4.  Patient will score 42 on Berg Balance test to demonstrate lower risk of falls. (MCID= 8 points) .  Baseline: 36/56 Goal status: INITIAL  5.  Patient will report no falls in a 4 weeks period. Baseline: falls every few days Goal status: INITIAL  PLAN:  PT FREQUENCY: 2x/week  PT DURATION: 12 weeks  PLANNED INTERVENTIONS: Therapeutic exercises, Therapeutic activity, Neuromuscular re-education, Balance training, Gait training, Patient/Family education, Self Care, Joint mobilization, Stair training, Cryotherapy, Moist heat, and Manual therapy.  PLAN FOR NEXT SESSION: balance and LE strengthening   Grayce Sessions, PTA 03/04/2022, 9:25 AM

## 2022-03-07 ENCOUNTER — Encounter: Payer: Self-pay | Admitting: Physical Therapy

## 2022-03-07 ENCOUNTER — Ambulatory Visit: Payer: Medicare Other | Admitting: Physical Therapy

## 2022-03-07 ENCOUNTER — Encounter: Payer: Self-pay | Admitting: Internal Medicine

## 2022-03-07 DIAGNOSIS — R296 Repeated falls: Secondary | ICD-10-CM | POA: Diagnosis not present

## 2022-03-07 DIAGNOSIS — R2689 Other abnormalities of gait and mobility: Secondary | ICD-10-CM

## 2022-03-07 DIAGNOSIS — M5459 Other low back pain: Secondary | ICD-10-CM

## 2022-03-07 NOTE — Therapy (Signed)
OUTPATIENT PHYSICAL THERAPY THORACOLUMBAR TREATMENT   Patient Name: Brandon Mendoza MRN: [id number] DOB:Jul 30, 1940, 81 y.o., male Today's Date: 03/07/2022  END OF SESSION:  PT End of Session - 03/07/22 1010     Visit Number 4    Date for PT Re-Evaluation 05/18/22    PT Start Time 1010    PT Stop Time 1055    PT Time Calculation (min) 45 min    Activity Tolerance Patient tolerated treatment well    Behavior During Therapy [facility] for tasks assessed/performed             Past Medical History:  Diagnosis Date   Acquired clawfoot, right foot 12/16/2021   Anxiety    Atrophy of calf muscles 10/21/2021   Duplex calves was negative for significant blockage EM[facility] was done at [facility] MRI lumbar spine was also done at [facility]   Carotid artery occlusion    COPD (chronic obstructive pulmonary disease) (HCC)    Depression    Dysrhythmia    PAF, atrial tachycardia   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   The spinal cord appears normal. 2.   No spinal stenosis. 3.   Mild multilevel degenerative changes as detailed above that do not lead to spinal stenosis or nerve root compression. 4.   T2 hyperintense foci within the pons consistent with chronic microvascular ischemic changes.   Loop - Medtronic Linq 10/22/2020 10/22/2020   Nocturia    Peripheral vascular disease (HCC)    Sleep apnea    on 6 cm, nasal pillow   Stroke ([place]) 05/01/2011   ischemic   Weakness generalized 10/21/2021   Severe, legs and arms   Past Surgical History:  Procedure Laterality Date   CAROTID ENDARTERECTOMY  12/09/11   Left cea   ENDARTERECTOMY  12/09/2011   Procedure: ENDARTERECTOMY CAROTID;  Surgeon: Rosetta Posner, MD;  Location: [facility];  Service: Vascular;  Laterality: Left;   EYE SURGERY      rt retina detachment   HAMMER TOE SURGERY  2004   HERNIA REPAIR  2006   SHOULDER ARTHROSCOPY W/ ROTATOR CUFF REPAIR  2010   TONSILLECTOMY     Patient Active Problem List   Diagnosis Date Noted   Macular degeneration 03/01/2022   Myofascial pain 02/10/2022   Lumbar spondylosis 01/03/2022   Acquired clawfoot, left foot 12/16/2021   Acquired clawfoot, right foot 12/16/2021   Claw foot 12/14/2021   Gait abnormality 12/10/2021   Spinal stenosis at L4-L5 level 12/10/2021   Ataxia 11/22/2021   Lightheadedness 11/02/2021   Claudication of lower extremity ([place]) 10/21/2021   Atrophy of calf muscles 10/21/2021   Weakness generalized 10/21/2021   Focal neurological deficit 10/21/2021   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   Pain in joint of right hip 09/14/2021   High arches 07/22/2021   Hammertoes of both feet 07/22/2021   Neuropathy 07/22/2021   Constipation 05/28/2021   Disequilibrium 05/28/2021   Personal history of colonic polyps 05/28/2021   Renal cyst 05/28/2021   Gastroesophageal reflux disease without esophagitis 05/05/2021   Ingrowing left great toenail 02/15/2021   S/P lumbar fusion 12/24/2020   Spinal stenosis of lumbar region 12/04/2020   Acquired  complex renal cyst 11/23/2020   Functional gait abnormality 11/04/2020   Idiopathic neuropathy 11/02/2020   Tinea pedis of both feet 10/28/2020   Loop - Medtronic Linq 10/22/2020 10/22/2020   Benign hypertension with CKD (chronic kidney disease) stage III ([place]) 10/05/2020   Fall with injury 10/05/2020   Hematoma 09/16/2020   Normocytic anemia 09/14/2020   AF (paroxysmal atrial fibrillation) ([place]) 09/14/2020   Chronic anticoagulation 09/14/2020   Hyponatremia 09/14/2020   Pain in left foot 01/22/2020   Acquired thrombophilia ([place]) 12/30/2019   Pain in joint of right knee 12/27/2019   Recurrent falls 11/17/2019   Paroxysmal atrial fibrillation ([place]) 10/22/2019   Pain in right foot  10/14/2019   Falls 10/07/2019   Ataxia involving legs 08/12/2019   OSA on CPAP 06/20/2018   Urge incontinence 04/13/2018   Urinary urgency 04/13/2018   Pain in left knee 03/01/2018   Other intervertebral disc degeneration, lumbar region 06/16/2017   Acquired bilateral hammer toes 10/05/2015   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   Generalized anxiety disorder 05/12/2015   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[place]) 05/10/2015   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[place]) 04/10/2014   History of left-sided carotid endarterectomy 04/10/2014   Essential hypertension 04/10/2014   Occlusion and stenosis of carotid artery without mention of cerebral infarction 12/15/2011   Habitual alcohol use 12/01/2011   Carotid artery stenosis, symptomatic 11/30/2011   Cerebral infarction ([place]) 11/29/2011   Hyperlipidemia     PCP: Berniece Pap  REFERRING PROVIDER: Berniece Pap  REFERRING DIAG: M54.42,M54.41,G89.29   Rationale for Evaluation and Treatment: Rehabilitation  THERAPY DIAG:  Repeated falls  Other low back pain  Other abnormalities of gait and mobility  ONSET DATE: 01/25/22  SUBJECTIVE:                                                                                                                                                                                           SUBJECTIVE STATEMENT: "Same old, same old"  PERTINENT HISTORY:  Cerebral infarct 10/21/21, lumbar fusion, recurrent falls   PAIN:  Are you having pain? Yes: NPRS scale: 0/10 Pain location: low back Pain description: ache  Aggravating factors: being on my feet  Relieving factors: movement  PRECAUTIONS: Fall  WEIGHT BEARING RESTRICTIONS: No  FALLS:  Has patient fallen in last 6 months? Yes. Number of falls every few days   LIVING ENVIRONMENT: Lives with: lives with their spouse Lives in: House/apartment Stairs: Yes: Internal: 20 steps; on right going up Has  following equipment at home: Gilford Rile - 4 wheeled and trekking pole  OCCUPATION: Retired  PLOF: Independent  PATIENT GOALS:  to not fall and go deer hunting again  NEXT MD VISIT:   OBJECTIVE:   DIAGNOSTIC FINDINGS:    LUMBAR SPINE FINDINGS: 1. Wide laminectomy and posterior pedicle screw and rod fixation at L4-5 without residual or recurrent stenosis. 2. Progressive adjacent level disease at L3-4 with moderate right and mild left subarticular narrowing and moderate foraminal stenosis bilaterally. Progression is predominantly due to facet disease 3. Progressive moderate facet hypertrophy at L1-2 with a progressive rightward disc protrusion resulting in progressive moderate right foraminal stenosis. Asymmetric endplate marrow edema on the right at L1-2 is consistent with progressive disease as well. 4. Otherwise stable degenerative change without other focal stenosis.   THORACIC SPINE FINDINGS:  On sagittal views the vertebral bodies have normal height and alignment.  Hemangioma within T9 vertebral body and slightly within T10 vertebral body.  Anterior kyphosis.  Mild scoliosis convex right centered at T8 level. The spinal cord is normal in size and appearance. The paraspinal soft tissues are unremarkable.     On axial views there is no spinal stenosis or foraminal narrowing.  Limited views of the aorta, kidneys, liver, lungs and paraspinal muscles are notable for multiple large right renal cysts ranging in size from 1.5 to 7 cm.   COGNITION: Overall cognitive status: Within functional limits for tasks assessed     SENSATION: WFL  MUSCLE LENGTH: Hamstrings: mod tightness on both sides  POSTURE: rounded shoulders and forward head  LUMBAR ROM:   AROM eval  Flexion Limited due to HS tightness  Extension WFL  Right lateral flexion Tightness in R side  Left lateral flexion WFL  Right rotation WFL  Left rotation Tightness on R side   (Blank rows = not tested)  LOWER  EXTREMITY ROM:   grossly WFL    LOWER EXTREMITY MMT:  grossly 4+/5   FUNCTIONAL TESTS:  5 times sit to stand: 20s Timed up and go (TUG): 16s  GAIT: Distance walked: in clinic distances Assistive device utilized:  trekking pole Level of assistance: SBA Comments: ataxic gait pattern, increased lateral sway, decreased safety awareness   TODAY'S TREATMENT:                                                                                                                              DATE:  03/07/22 NuStep L5 x 6 min Seated Rows & Lats 35lb 2x10  Shoulder Ext 10lb 2x10 S2S on airex elevated mat 2x10 Alt 4 in box taps with trekking pole frequent LOB posteriorly  Side step and tandem walking on balance beam in // bars Seated HS curls 45lb 2x10 Leg Ext 20lb 3x10  03/04/22 NuStep L5 x 6 min Alt 6 & 4 in box taps with and without trekking pole frequent LOB posteriorly  Resisted gait 20lb forward/backward x4, side step x3 Leg press 60lb 4x15 "Pt requested weight" Seated HS curls 45lb 2x10 Leg Ext 10lb 2x10 S2S 2x10   03/01/22  NuStep L5  6  min S2S holding yellow ball 2x10 Seated HS curls 25lb 2x12 Leg Ext 10lb 2x10 Alt 6 in box taps frequent LOB posteriorly  Hip add ball squeeze 2x10 Standing ball toss frequent LOB Backwards walking    02/23/22- EVAL    PATIENT EDUCATION:  Education details: HEP and POC Person educated: Patient Education method: Explanation Education comprehension: verbalized understanding  HOME EXERCISE PROGRAM: Marching, 3 way leg kicks, mini squats, heel raises, STS from chair   ASSESSMENT:  CLINICAL IMPRESSION: Patient is a 81 y.o. male who was seen today for physical therapy treatment. He enters feeling well. Continues to have balance issues losing his balance posteriorly with alt box taps. Pt had a very difficulty time with tandem walking. Constant cue for upright posture with side steps. Cues at times needed to concentrate with  interventions.    OBJECTIVE IMPAIRMENTS: Abnormal gait, decreased balance, decreased coordination, and difficulty walking.   ACTIVITY LIMITATIONS: squatting, stairs, transfers, and locomotion level  REHAB POTENTIAL: Fair dependent on carry over and patient compliance  CLINICAL DECISION MAKING: Stable/uncomplicated  EVALUATION COMPLEXITY: Low  GOALS: Goals reviewed with patient? Yes  SHORT TERM GOALS: Target date: 04/06/22  Patient will be independent with initial HEP. Goal status: Met 03/07/22  2.  Patient will be educated on strategies to decrease risk of falls.  Goal status: 03/07/22   LONG TERM GOALS: Target date: 05/18/22  Patient will be independent with advanced/ongoing HEP to improve outcomes and carryover.  Goal status: INITIAL  2.  Patient will demonstrate decreased fall risk by scoring < 12 sec on TUG. Baseline: 16s Goal status: INITIAL  3.  Patient will demonstrate improved functional LE strength as demonstrated by < 15s on 5xSTS. Baseline: 20s Goal status: INITIAL  4.  Patient will score 42 on Berg Balance test to demonstrate lower risk of falls. (MCID= 8 points) .  Baseline: 36/56 Goal status: INITIAL  5.  Patient will report no falls in a 4 weeks period. Baseline: falls every few days Goal status: INITIAL  PLAN:  PT FREQUENCY: 2x/week  PT DURATION: 12 weeks  PLANNED INTERVENTIONS: Therapeutic exercises, Therapeutic activity, Neuromuscular re-education, Balance training, Gait training, Patient/Family education, Self Care, Joint mobilization, Stair training, Cryotherapy, Moist heat, and Manual therapy.  PLAN FOR NEXT SESSION: balance and LE strengthening   Scot Jun, PTA 03/07/2022, 10:10 AM

## 2022-03-08 ENCOUNTER — Ambulatory Visit: Payer: Medicare Other

## 2022-03-09 ENCOUNTER — Ambulatory Visit: Payer: Medicare Other | Admitting: Neurology

## 2022-03-09 ENCOUNTER — Ambulatory Visit (INDEPENDENT_AMBULATORY_CARE_PROVIDER_SITE_OTHER): Payer: Medicare Other | Admitting: Neurology

## 2022-03-09 VITALS — BP 151/90 | HR 75

## 2022-03-09 DIAGNOSIS — I6523 Occlusion and stenosis of bilateral carotid arteries: Secondary | ICD-10-CM

## 2022-03-09 DIAGNOSIS — G629 Polyneuropathy, unspecified: Secondary | ICD-10-CM

## 2022-03-09 DIAGNOSIS — R269 Unspecified abnormalities of gait and mobility: Secondary | ICD-10-CM

## 2022-03-09 DIAGNOSIS — M48061 Spinal stenosis, lumbar region without neurogenic claudication: Secondary | ICD-10-CM

## 2022-03-09 NOTE — Progress Notes (Signed)
Chief Complaint  Patient presents with   Procedure    Rm 3 here for NCS/EMG      ASSESSMENT AND PLAN  Brandon Mendoza is a 81 y.o. 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 Ultrasound of carotid artery March 2023, left carotid artery patent carotid endarterectomy with no evidence for restenosis, right internal carotid less than 39% stenosis, antegrade flow of bilateral vertebral artery.  Lower extremity arterial Doppler study February 2021, no evidence of arterial occlusive disease,  MEDICAL HISTORY:  Brandon Mendoza,  is a 81 year old male, seen in request by his primary care physician Dr. Jon BillingsMorrison, Alycia Rossettiyan for slow worsening gait abnormality, he is a patient of Dr. Vickey Hugerohmeier for obstructive sleep apnea  I reviewed and summarized the referring note. PMHX. HTN HLD Stroke COPD, quit smoke in 2006, was smoking 1ppd OSA Stroke in 2013, s/p left carotid artery endarterectomy Lumbar decompression surgery by Dr. Debbe BalesBrook in Sept 2022, L4-5.  He used to be very active, played racquetball regularly on until 2021, at that time he noticed increased low back pain, balance issues, underwent L4-5 decompression surgery by orthopedic surgeon Dr. 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 Bufford ButtnerMehta, only bilateral lower extremity motor examination was performed, evidence of bilateral peroneal, tibial motor prolonged distal latency decreased CMAP amplitude, slow conduction velocity. No EMG was performed, based on the test, Dr. Bufford ButtnerMehta raised the concern of pure motor neuropathy involving bilateral tibial and peroneal motor nerve with demyelinating and axonal injury.  Personally reviewed pre surgical MRI lumbar from February 2022, L4-5 grade 1 anterolisthesis, with severe spinal stenosis moderate to severe left foraminal narrowing, facet arthropathy throughout the lumbar spine  Post L4-5  decompression MRI of lumbar spine from September 2023, posterior pedicle screw and rod fixation L4-5 without residual stenosis, progressive adjacent level disease  at L3-4 with moderate right and mild left subarticular narrowing and moderate foraminal stenosis, progressive moderate facet hypertrophy at L1-2 with progressive whiteboard disc protrusion with moderate right foraminal stenosis  MRI of the brain with without contrast November 26, 2021, generalized atrophy moderate small vessel disease no acute abnormality, remote lacunar infarction of left corona radiata, left caudate head  MRI cervical spine, mild degenerative changes no significant canal or foraminal narrowing  Update March 09, 2022: He return for electrodiagnostic study today showed mild axonal sensorimotor polyneuropathy, 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  We personally reviewed MRIs of neuraxis, MRI of the brain showed moderate pontine and periventricular small vessel disease  MRI of cervical and thoracic spine showed multilevel degenerative changes, but there is no evidence of canal stenosis  MRI of lumbar showed postsurgical changes, right laminectomy and posterior pedicle screw and rod fixation at L4-5 without residual or recurrent stenosis, adjacent level degenerative changes, but no significant canal stenosis variable degree of foraminal narrowing   PHYSICAL EXAM:   Vitals:   03/09/22 0904  BP: (!) 151/90  Pulse: 75   Not recorded     There is no height or weight on file to calculate BMI.  PHYSICAL EXAMNIATION:  Gen: NAD, conversant, well nourised, well groomed                     Cardiovascular: Regular rate rhythm, no peripheral edema, warm, nontender. Eyes: Conjunctivae clear without exudates or hemorrhage Neck: Supple, no carotid bruits. Pulmonary: Clear to auscultation bilaterally   NEUROLOGICAL EXAM:  MENTAL  STATUS: Speech/cognition: Awake, alert, oriented to history taking and casual conversation CRANIAL NERVES: CN II: Visual fields are full to confrontation. Pupils are round equal and briskly reactive to light. CN III, IV, VI: extraocular movement are normal. No ptosis. CN V: Facial sensation is intact to light touch CN VII: Face is symmetric with normal eye closure  CN VIII: Hearing is normal to causal conversation. CN IX, X: Phonation is normal. CN XI: Head turning and shoulder shrug are intact  MOTOR: Bilateral hammertoes, bilateral upper extremity proximal lower extremity muscle strength is normal, mild right more than left ankle dorsiflexion/plantar flexion weakness, also mild to moderate bilateral toe extension flexion, eversion, inversion weakness  REFLEXES: Reflexes are 2+ and symmetric at the biceps, triceps, 3/3knees, and absent at ankles. Plantar responses are flexor.   SENSORY: Absent vibratory sensation and pinprick to ankle level COORDINATION: There is no trunk or limb dysmetria noted.  GAIT/STANCE: Can get up from seated position arm crossed, wide-based, stiff, unsteady, mild bilateral foot drop  REVIEW OF SYSTEMS:  Full 14 system review of systems performed and notable only for as above All other review of systems were negative.   ALLERGIES: Allergies  Allergen Reactions   Sulfamethoxazole-Trimethoprim Nausea And Vomiting    HOME MEDICATIONS: Current Outpatient Medications  Medication Sig Dispense Refill   ALPRAZolam (XANAX) 0.25 MG tablet 1 tablet (0.25 mg total) by Per NG tube route daily as needed for anxiety. 90 tablet 0   Ascorbic Acid (VITAMIN C) 1000 MG tablet Take 500 mg by mouth daily.     aspirin 81 MG chewable tablet Chew 1 tablet by mouth daily.     DULoxetine (CYMBALTA) 30 MG capsule Take 30 mg by mouth daily.     flecainide (TAMBOCOR) 100 MG tablet Take 1 tablet every 12 hours by oral route.     losartan-hydrochlorothiazide (HYZAAR) 50-12.5 MG  tablet Take 1 tablet by mouth every morning. 90 tablet 3   meclizine (ANTIVERT) 12.5 MG tablet as needed. As needed.     Melatonin 5 MG CHEW Chew 5 mg by mouth at bedtime as needed.     meloxicam (MOBIC) 15 MG tablet As needed.     MULTIPLE VITAMIN PO Take 1 tablet by mouth daily.     rosuvastatin (CRESTOR) 20 MG tablet Take 0.5 tablets (10 mg total) by mouth at bedtime. 90 tablet 3   sertraline (ZOLOFT) 50 MG tablet Take 1 tablet (50 mg total) by mouth daily. Use to taper off sertraline slowly over several months, if tolerated. Be aware anxiety and irritability may increase and you might have to stay on this 90 tablet 3   No current facility-administered medications for this visit.    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   The spinal cord appears normal. 2.   No spinal stenosis. 3.   Mild multilevel degenerative changes as detailed above that do not lead to spinal stenosis or nerve root compression. 4.   T2 hyperintense foci within the pons consistent with chronic microvascular ischemic changes.   Loop - Medtronic Linq 10/22/2020 10/22/2020   Nocturia    Peripheral vascular disease (HCC)    Sleep apnea    on 6 cm, nasal pillow   Stroke (HCC) 05/01/2011   ischemic   Weakness generalized 10/21/2021   Severe, legs and arms    PAST SURGICAL  HISTORY: Past Surgical History:  Procedure Laterality Date   CAROTID ENDARTERECTOMY  12/09/11   Left cea   ENDARTERECTOMY  12/09/2011   Procedure: ENDARTERECTOMY CAROTID;  Surgeon: Larina Earthly, MD;  Location: [facility] OR;  Service: Vascular;  Laterality: Left;   EYE SURGERY     rt retina detachment   HAMMER TOE SURGERY  2004   HERNIA REPAIR  2006   SHOULDER ARTHROSCOPY W/ ROTATOR CUFF REPAIR  2010   TONSILLECTOMY      FAMILY HISTORY: Family History  Problem Relation Age of Onset   Alzheimer's disease Father    Diabetes Other     SOCIAL HISTORY: Social History   Socioeconomic History   Marital status: Married    Spouse name: [name]   Number of children: 4   Years of education: College   Highest education level: Not on file  Occupational History   Occupation: Retired  Tobacco Use   Smoking status: Former    Packs/day: 1.00    Years: 50.00    Total pack years: 50.00    Types: Cigarettes    Quit date: 08/26/2004    Years since quitting: 17.5    Passive exposure: Never   Smokeless tobacco: Never   Tobacco comments:    quit  smoking 08/2004  Vaping Use   Vaping Use: Never used  Substance and Sexual Activity   Alcohol use: Yes    Alcohol/week: 7.0 standard drinks of alcohol    Types: 7 Shots of liquor per week    Comment: 2-3 daily cocktails   Drug use: No   Sexual activity: Not on file  Other Topics Concern   Not on file  Social History Narrative   Patient is married with 4 children.   Patient is right handed.   Patient has college education.   Patient drinks 3 cups daily.   Social Determinants of Health   Financial Resource Strain: Not on file  Food Insecurity: No Food Insecurity (12/10/2021)   Hunger Vital Sign    Worried About Running Out of Food in the Last Year: Never true    Ran Out of Food in the Last Year: Never true  Transportation Needs: No Transportation Needs (12/10/2021)   PRAPARE - Administrator, Civil Service (Medical): No    Lack of  Transportation (Non-Medical): No  Physical Activity: Not on file  Stress: Not on file  Social Connections: Not on file  Intimate Partner Violence: Not on file      Levert Feinstein, M.D. Ph.D.  [facility] [address] [place], [place] [address] Ph: [phone] Fax: [phone]  CC:  Lula Olszewski, MD [address] [place],  [place] [address]  Lula Olszewski, MD

## 2022-03-09 NOTE — Procedures (Signed)
Full Name: Brandon Mendoza Gender: Male MRN #: [id number] Date of Birth: 07-31-1940    Visit Date: 03/09/2022 09:56 Age: 81 Years Examining Physician: Levert Feinstein Referring Physician: Levert Feinstein Height: 5 feet 8 inch History: 81 year old male with history of severe lumbar stenosis, status post decompression September 2022, low back pain has improved, continue to have gait abnormality, denies upper extremity symptoms.  Summary of the test: Nerve conduction study: Bilateral sural, superficial peroneal sensory responses were absent.  Left median, ulnar sensory responses were normal.  Bilateral peroneal to EDB motor responses showed mildly prolonged distal latency, mild slow conduction velocity, and decreased CMAP amplitude at proximal stimulation site.  Bilateral tibial motor responses showed significantly decreased CMAP amplitude, decreased conduction velocity and prolonged F-wave latency.  Left median and ulnar motor responses were normal.  Electromyography: Selected needle examinations were performed at bilateral lower extremity muscles, bilateral lumbosacral paraspinal muscles.  There is evidence of chronic neuropathic changes involving bilateral L4-5 S1 myotomes, right worse than left.  There is increased insertional activity along bilateral lower lumbar paraspinal muscles.  Conclusion: This is an abnormal study.  There is electrodiagnostic evidence of chronic bilateral lumbosacral radiculopathy, involving bilateral L4-5 S1 myotomes.  There is no evidence of active process.  In addition, there is evidence of mild axonal sensorimotor polyneuropathy.    ------------------------------- Levert Feinstein M.D. PhD  [facility] [address], [address] [place], [place] [address] Tel: [phone] Fax: [phone]  Verbal informed consent was obtained from the patient, patient was informed of potential risk of procedure, including bruising, bleeding, hematoma  formation, infection, muscle weakness, muscle pain, numbness, among others.        MNC    Nerve / Sites Muscle Latency Ref. Amplitude Ref. Rel Amp Segments Distance Velocity Ref. Area    ms ms mV mV %  cm m/s m/s mVms  L Median - APB     Wrist APB 3.1 ?4.4 6.4 ?4.0 100 Wrist - APB 7   16.9     Upper arm APB 7.0  5.0  79 Upper arm - Wrist 26 65 ?49 14.5  L Ulnar - ADM     Wrist ADM 2.6 ?3.3 10.8 ?6.0 100 Wrist - ADM 7   36.9     B.Elbow ADM 5.4  10.1  93.3 B.Elbow - Wrist 16 57 ?49 37.1     A.Elbow ADM 7.7  9.8  97.3 A.Elbow - B.Elbow 12 53 ?49 38.8  R Peroneal - EDB     Ankle EDB 6.7 ?6.5 2.4 ?2.0 100 Ankle - EDB 9   0.6     Fib head EDB 13.5  2.2  92.4 Fib head - Ankle 28 41 ?44 6.7     Pop fossa EDB 17.3  2.2  98.5 Pop fossa - Fib head 15 40 ?44 7.6         Pop fossa - Ankle      L Peroneal - EDB     Ankle EDB 6.9 ?6.5 2.3 ?2.0 100 Ankle - EDB 9   7.8     Fib head EDB 13.8  2.1  91.6 Fib head - Ankle 30 44 ?44 7.5     Pop fossa EDB 16.8  1.8  87.7 Pop fossa - Fib head 13 43 ?44 1.0         Pop fossa - Ankle      R Tibial - AH     Ankle AH 5.9 ?5.8 0.7 ?  4.0 100 Ankle - AH 9   3.0     Pop fossa AH 21.0  0.5  68.8 Pop fossa - Ankle 47 31 ?41 1.6  L Tibial - AH     Ankle AH 8.8 ?5.8 1.0 ?4.0 100 Ankle - AH 9   3.9     Pop fossa AH 21.0  0.8  76.4 Pop fossa - Ankle 46 38 ?41                  SNC    Nerve / Sites Rec. Site Peak Lat Ref.  Amp Ref. Segments Distance    ms ms V V  cm  L Sural - Ankle (Calf)     Calf Ankle NR ?4.4 NR ?6 Calf - Ankle 14  R Sural - Ankle (Calf)     Calf Ankle NR ?4.4 NR ?6 Calf - Ankle 14  L Superficial peroneal - Ankle     Lat leg Ankle NR ?4.4 NR ?6 Lat leg - Ankle 14  R Superficial peroneal - Ankle     Lat leg Ankle NR ?4.4 NR ?6 Lat leg - Ankle 14  L Median - Orthodromic (Dig II, Mid palm)     Dig II Wrist 3.1 ?3.4 10 ?10 Dig II - Wrist 13  L Ulnar - Orthodromic, (Dig V, Mid palm)     Dig V Wrist 2.7 ?3.1 5 ?5 Dig V - Wrist 58                  F  Wave    Nerve F Lat Ref.   ms ms  L Ulnar - ADM 28.7 ?32.0  L Tibial - AH 66.7 ?56.0  R Tibial - AH 79.5 ?56.0           EMG Summary Table    Spontaneous MUAP Recruitment  Muscle IA Fib PSW Fasc Other Amp Dur. Poly Pattern  R. Tibialis anterior Increased 1+ None None _______ Increased Increased 1+ Reduced  R. Tibialis posterior Increased None None None _______ Increased Increased 1+ Reduced  R. Peroneus longus Increased None None None _______ Increased Increased 1+ Reduced  R. Gastrocnemius (Medial head) Increased None None None _______ Increased Increased 1+ Reduced  R. Vastus lateralis Normal None None None _______ Normal Normal Normal Reduced  L. Tibialis anterior Increased None None None _______ Increased Increased 1+ Reduced  L. Tibialis posterior Increased None None None _______ Normal Normal Normal Reduced  L. Gastrocnemius (Medial head) Normal None None None _______ Normal Normal Normal Reduced  L. Vastus lateralis Normal None None None _______ Normal Normal Normal Reduced  R. Lumbar paraspinals (low) Increased None None None _______ Normal Normal Normal Normal  R. Lumbar paraspinals (mid) Increased None None None _______ Normal Normal Normal Normal  L. Lumbar paraspinals (low) Normal 1+ None None _______ Normal Normal Normal Normal  L. Lumbar paraspinals (mid) Increased 1+ None None _______ Normal Normal Normal Normal

## 2022-03-10 ENCOUNTER — Ambulatory Visit: Payer: Medicare Other | Admitting: Physical Therapy

## 2022-03-10 ENCOUNTER — Encounter: Payer: Self-pay | Admitting: Physical Therapy

## 2022-03-10 DIAGNOSIS — R2689 Other abnormalities of gait and mobility: Secondary | ICD-10-CM

## 2022-03-10 DIAGNOSIS — R296 Repeated falls: Secondary | ICD-10-CM

## 2022-03-10 DIAGNOSIS — M6249 Contracture of muscle, multiple sites: Secondary | ICD-10-CM

## 2022-03-10 DIAGNOSIS — M5459 Other low back pain: Secondary | ICD-10-CM

## 2022-03-10 NOTE — Therapy (Signed)
OUTPATIENT PHYSICAL THERAPY THORACOLUMBAR TREATMENT   Patient Name: Brandon Mendoza MRN: [id number] DOB:[birth year], 81 y.o., male Today's Date: 03/10/2022  END OF SESSION:  PT End of Session - 03/10/22 0844     Visit Number 5    Date for PT Re-Evaluation 05/18/22    PT Start Time 0845    PT Stop Time 0930    PT Time Calculation (min) 45 min    Activity Tolerance Patient tolerated treatment well    Behavior During Therapy [facility] for tasks assessed/performed             Past Medical History:  Diagnosis Date   Acquired clawfoot, right foot 12/16/2021   Anxiety    Atrophy of calf muscles 10/21/2021   Duplex calves was negative for significant blockage [facility] MRI lumbar spine was also done at [facility]   Carotid artery occlusion    COPD (chronic obstructive pulmonary disease) (HCC)    Depression    Dysrhythmia    PAF, atrial tachycardia   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   The spinal cord appears normal. 2.   No spinal stenosis. 3.   Mild multilevel degenerative changes as detailed above that do not lead to spinal stenosis or nerve root compression. 4.   T2 hyperintense foci within the pons consistent with chronic microvascular ischemic changes.   Loop - Medtronic Linq 10/22/2020 10/22/2020   Nocturia    Peripheral vascular disease (HCC)    Sleep apnea    on 6 cm, nasal pillow   Stroke ([place]) 05/01/2011   ischemic   Weakness generalized 10/21/2021   Severe, legs and arms   Past Surgical History:  Procedure Laterality Date   CAROTID ENDARTERECTOMY  12/09/11   Left cea   ENDARTERECTOMY  12/09/2011   Procedure: ENDARTERECTOMY CAROTID;  Surgeon: Rosetta Posner, MD;  Location: [place];  Service: Vascular;  Laterality: Left;   EYE SURGERY      rt retina detachment   HAMMER TOE SURGERY  2004   HERNIA REPAIR  2006   SHOULDER ARTHROSCOPY W/ ROTATOR CUFF REPAIR  2010   TONSILLECTOMY     Patient Active Problem List   Diagnosis Date Noted   Macular degeneration 03/01/2022   Myofascial pain 02/10/2022   Lumbar spondylosis 01/03/2022   Acquired clawfoot, left foot 12/16/2021   Acquired clawfoot, right foot 12/16/2021   Claw foot 12/14/2021   Gait abnormality 12/10/2021   Spinal stenosis at L4-L5 level 12/10/2021   Ataxia 11/22/2021   Lightheadedness 11/02/2021   Claudication of lower extremity ([place]) 10/21/2021   Atrophy of calf muscles 10/21/2021   Weakness generalized 10/21/2021   Focal neurological deficit 10/21/2021   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   Pain in joint of right hip 09/14/2021   High arches 07/22/2021   Hammertoes of both feet 07/22/2021   Neuropathy 07/22/2021   Constipation 05/28/2021   Disequilibrium 05/28/2021   Personal history of colonic polyps 05/28/2021   Renal cyst 05/28/2021   Gastroesophageal reflux disease without esophagitis 05/05/2021   Ingrowing left great toenail 02/15/2021   S/P lumbar fusion 12/24/2020   Spinal stenosis of lumbar region 12/04/2020   Acquired  complex renal cyst 11/23/2020   Functional gait abnormality 11/04/2020   Idiopathic neuropathy 11/02/2020   Tinea pedis of both feet 10/28/2020   Loop - Medtronic Linq 10/22/2020 10/22/2020   Benign hypertension with CKD (chronic kidney disease) stage III ([place]) 10/05/2020   Fall with injury 10/05/2020   Hematoma 09/16/2020   Normocytic anemia 09/14/2020   AF (paroxysmal atrial fibrillation) ([place]) 09/14/2020   Chronic anticoagulation 09/14/2020   Hyponatremia 09/14/2020   Pain in left foot 01/22/2020   Acquired thrombophilia ([place]) 12/30/2019   Pain in joint of right knee 12/27/2019   Recurrent falls 11/17/2019   Paroxysmal atrial fibrillation ([place]) 10/22/2019   Pain in right foot  10/14/2019   Falls 10/07/2019   Ataxia involving legs 08/12/2019   OSA on CPAP 06/20/2018   Urge incontinence 04/13/2018   Urinary urgency 04/13/2018   Pain in left knee 03/01/2018   Other intervertebral disc degeneration, lumbar region 06/16/2017   Acquired bilateral hammer toes 10/05/2015   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   Generalized anxiety disorder 05/12/2015   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/10/2015   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   History of left-sided carotid endarterectomy 04/10/2014   Essential hypertension 04/10/2014   Occlusion and stenosis of carotid artery without mention of cerebral infarction 12/15/2011   Habitual alcohol use 12/01/2011   Carotid artery stenosis, symptomatic 11/30/2011   Cerebral infarction ([place]) 11/29/2011   Hyperlipidemia     PCP: Berniece Pap  REFERRING PROVIDER: Berniece Pap  REFERRING DIAG: M54.42,M54.41,G89.29   Rationale for Evaluation and Treatment: Rehabilitation  THERAPY DIAG:  Repeated falls  Other abnormalities of gait and mobility  Contracture of muscle, multiple sites  Other low back pain  ONSET DATE: 01/25/22  SUBJECTIVE:                                                                                                                                                                                           SUBJECTIVE STATEMENT: "Good" Had a nerve conduction study yesterday  PERTINENT HISTORY:  Cerebral infarct 10/21/21, lumbar fusion, recurrent falls   PAIN:  Are you having pain? Yes: NPRS scale: 0/10 Pain location: low back Pain description: ache  Aggravating factors: being on my feet  Relieving factors: movement  PRECAUTIONS: Fall  WEIGHT BEARING RESTRICTIONS: No  FALLS:  Has patient fallen in last 6 months? Yes. Number of falls every few days   LIVING ENVIRONMENT: Lives with: lives with their spouse Lives in:  House/apartment Stairs: Yes: Internal: 20 steps; on right going up Has following equipment at home: Gilford Rile - 4 wheeled and trekking pole  OCCUPATION: Retired  PLOF: Independent  PATIENT GOALS: to not fall and go deer hunting again  NEXT MD VISIT:   OBJECTIVE:   DIAGNOSTIC FINDINGS:    LUMBAR SPINE FINDINGS: 1. Wide laminectomy and posterior pedicle screw and rod fixation at L4-5 without residual or recurrent stenosis. 2. Progressive adjacent level disease at L3-4 with moderate right and mild left subarticular narrowing and moderate foraminal stenosis bilaterally. Progression is predominantly due to facet disease 3. Progressive moderate facet hypertrophy at L1-2 with a progressive rightward disc protrusion resulting in progressive moderate right foraminal stenosis. Asymmetric endplate marrow edema on the right at L1-2 is consistent with progressive disease as well. 4. Otherwise stable degenerative change without other focal stenosis.   THORACIC SPINE FINDINGS:  On sagittal views the vertebral bodies have normal height and alignment.  Hemangioma within T9 vertebral body and slightly within T10 vertebral body.  Anterior kyphosis.  Mild scoliosis convex right centered at T8 level. The spinal cord is normal in size and appearance. The paraspinal soft tissues are unremarkable.     On axial views there is no spinal stenosis or foraminal narrowing.  Limited views of the aorta, kidneys, liver, lungs and paraspinal muscles are notable for multiple large right renal cysts ranging in size from 1.5 to 7 cm.   COGNITION: Overall cognitive status: Within functional limits for tasks assessed     SENSATION: WFL  MUSCLE LENGTH: Hamstrings: mod tightness on both sides  POSTURE: rounded shoulders and forward head  LUMBAR ROM:   AROM eval  Flexion Limited due to HS tightness  Extension WFL  Right lateral flexion Tightness in R side  Left lateral flexion WFL  Right rotation WFL  Left  rotation Tightness on R side   (Blank rows = not tested)  LOWER EXTREMITY ROM:   grossly WFL    LOWER EXTREMITY MMT:  grossly 4+/5   FUNCTIONAL TESTS:  5 times sit to stand: 20s Timed up and go (TUG): 16s  GAIT: Distance walked: in clinic distances Assistive device utilized:  trekking pole Level of assistance: SBA Comments: ataxic gait pattern, increased lateral sway, decreased safety awareness   TODAY'S TREATMENT:                                                                                                                              DATE:  03/10/22 Bike L4 x 6 min Resisted gait 20lb forward/backward x4, side step x4 Seated Rows & Lats 35lb 2x12 Chest press 15lb 2x15 Leg press 60lb 3x15  Alt 4 in box taps with trekking pole frequent LOB posteriorly  Seated HS curls 45lb 2x10 Leg Ext 20lb 2x10   03/07/22 NuStep L5 x 6 min Seated Rows & Lats 35lb 2x10  Shoulder Ext 10lb 2x10 S2S on airex elevated mat 2x10 Alt 4 in box taps with trekking pole frequent LOB posteriorly  Side step and tandem walking on balance beam in // bars Seated HS curls 45lb 2x10 Leg  Ext 20lb 3x10  03/04/22 NuStep L5 x 6 min Alt 6 & 4 in box taps with and without trekking pole frequent LOB posteriorly  Resisted gait 20lb forward/backward x4, side step x3 Leg press 60lb 4x15 "Pt requested weight" Seated HS curls 45lb 2x10 Leg Ext 10lb 2x10 S2S 2x10   03/01/22  NuStep L5  6 min S2S holding yellow ball 2x10 Seated HS curls 25lb 2x12 Leg Ext 10lb 2x10 Alt 6 in box taps frequent LOB posteriorly  Hip add ball squeeze 2x10 Standing ball toss frequent LOB Backwards walking    02/23/22- EVAL    PATIENT EDUCATION:  Education details: HEP and POC Person educated: Patient Education method: Explanation Education comprehension: verbalized understanding  HOME EXERCISE PROGRAM: Marching, 3 way leg kicks, mini squats, heel raises, STS from chair   ASSESSMENT:  CLINICAL  IMPRESSION: Patient is a 81 y.o. male who was seen today for physical therapy treatment. He enters feeling well. Continues to have balance issues losing his balance posteriorly with alt box taps. Good righting reaction taking steps backwards when looses balance. No issues with machine level interventions.   OBJECTIVE IMPAIRMENTS: Abnormal gait, decreased balance, decreased coordination, and difficulty walking.   ACTIVITY LIMITATIONS: squatting, stairs, transfers, and locomotion level  REHAB POTENTIAL: Fair dependent on carry over and patient compliance  CLINICAL DECISION MAKING: Stable/uncomplicated  EVALUATION COMPLEXITY: Low  GOALS: Goals reviewed with patient? Yes  SHORT TERM GOALS: Target date: 04/06/22  Patient will be independent with initial HEP. Goal status: Met 03/07/22  2.  Patient will be educated on strategies to decrease risk of falls.  Goal status: 03/07/22   LONG TERM GOALS: Target date: 05/18/22  Patient will be independent with advanced/ongoing HEP to improve outcomes and carryover.  Goal status: INITIAL  2.  Patient will demonstrate decreased fall risk by scoring < 12 sec on TUG. Baseline: 16s Goal status: ongoing  3.  Patient will demonstrate improved functional LE strength as demonstrated by < 15s on 5xSTS. Baseline: 20s Goal status: ongoing  4.  Patient will score 42 on Berg Balance test to demonstrate lower risk of falls. (MCID= 8 points) .  Baseline: 36/56 Goal status: INITIAL  5.  Patient will report no falls in a 4 weeks period. Baseline: falls every few days Goal status: INITIAL  PLAN:  PT FREQUENCY: 2x/week  PT DURATION: 12 weeks  PLANNED INTERVENTIONS: Therapeutic exercises, Therapeutic activity, Neuromuscular re-education, Balance training, Gait training, Patient/Family education, Self Care, Joint mobilization, Stair training, Cryotherapy, Moist heat, and Manual therapy.  PLAN FOR NEXT SESSION: balance and LE strengthening   Scot Jun, PTA 03/10/2022, 8:45 AM

## 2022-03-15 ENCOUNTER — Ambulatory Visit: Payer: Medicare Other | Admitting: Physical Therapy

## 2022-03-16 ENCOUNTER — Encounter: Payer: Self-pay | Admitting: Physical Therapy

## 2022-03-16 ENCOUNTER — Ambulatory Visit: Payer: Medicare Other | Admitting: Physical Therapy

## 2022-03-16 DIAGNOSIS — R296 Repeated falls: Secondary | ICD-10-CM

## 2022-03-16 DIAGNOSIS — M6249 Contracture of muscle, multiple sites: Secondary | ICD-10-CM

## 2022-03-16 DIAGNOSIS — R2689 Other abnormalities of gait and mobility: Secondary | ICD-10-CM

## 2022-03-16 NOTE — Therapy (Signed)
OUTPATIENT PHYSICAL THERAPY THORACOLUMBAR TREATMENT   Patient Name: Brandon Mendoza MRN: [id number] DOB:1940/08/10, 81 y.o., male Today's Date: 03/16/2022  END OF SESSION:  PT End of Session - 03/16/22 0924     Visit Number 6    Date for PT Re-Evaluation 05/18/22    PT Start Time 0925    PT Stop Time 1010    PT Time Calculation (min) 45 min    Activity Tolerance Patient tolerated treatment well    Behavior During Therapy [facility] for tasks assessed/performed             Past Medical History:  Diagnosis Date   Acquired clawfoot, right foot 12/16/2021   Anxiety    Atrophy of calf muscles 10/21/2021   Duplex calves was negative for significant blockage [facility] at [facility] lumbar spine was [facility] done at [facility]   Carotid artery occlusion    COPD (chronic obstructive pulmonary disease) (HCC)    Depression    Dysrhythmia    PAF, atrial tachycardia   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   The spinal cord appears normal. 2.   No spinal stenosis. 3.   Mild multilevel degenerative changes as detailed above that do not lead to spinal stenosis or nerve root compression. 4.   T2 hyperintense foci within the pons consistent with chronic microvascular ischemic changes.   Loop - Medtronic Linq 10/22/2020 10/22/2020   Nocturia    Peripheral vascular disease (HCC)    Sleep apnea    on 6 cm, nasal pillow   Stroke ([place]) 05/01/2011   ischemic   Weakness generalized 10/21/2021   Severe, legs and arms   Past Surgical History:  Procedure Laterality Date   CAROTID ENDARTERECTOMY  12/09/11   Left cea   ENDARTERECTOMY  12/09/2011   Procedure: ENDARTERECTOMY CAROTID;  Surgeon: Rosetta Posner, MD;  Location: [facility];  Service: Vascular;  Laterality: Left;   EYE SURGERY      rt retina detachment   HAMMER TOE SURGERY  2004   HERNIA REPAIR  2006   SHOULDER ARTHROSCOPY W/ ROTATOR CUFF REPAIR  2010   TONSILLECTOMY     Patient Active Problem List   Diagnosis Date Noted   Macular degeneration 03/01/2022   Myofascial pain 02/10/2022   Lumbar spondylosis 01/03/2022   Acquired clawfoot, left foot 12/16/2021   Acquired clawfoot, right foot 12/16/2021   Claw foot 12/14/2021   Gait abnormality 12/10/2021   Spinal stenosis at L4-L5 level 12/10/2021   Ataxia 11/22/2021   Lightheadedness 11/02/2021   Claudication of lower extremity ([place]) 10/21/2021   Atrophy of calf muscles 10/21/2021   Weakness generalized 10/21/2021   Focal neurological deficit 10/21/2021   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   Pain in joint of right hip 09/14/2021   High arches 07/22/2021   Hammertoes of both feet 07/22/2021   Neuropathy 07/22/2021   Constipation 05/28/2021   Disequilibrium 05/28/2021   Personal history of colonic polyps 05/28/2021   Renal cyst 05/28/2021   Gastroesophageal reflux disease without esophagitis 05/05/2021   Ingrowing left great toenail 02/15/2021   S/P lumbar fusion 12/24/2020   Spinal stenosis of lumbar region 12/04/2020   Acquired  complex renal cyst 11/23/2020   Functional gait abnormality 11/04/2020   Idiopathic neuropathy 11/02/2020   Tinea pedis of both feet 10/28/2020   Loop - Medtronic Linq 10/22/2020 10/22/2020   Benign hypertension with CKD (chronic kidney disease) stage III ([place]) 10/05/2020   Fall with injury 10/05/2020   Hematoma 09/16/2020   Normocytic anemia 09/14/2020   AF (paroxysmal atrial fibrillation) ([place]) 09/14/2020   Chronic anticoagulation 09/14/2020   Hyponatremia 09/14/2020   Pain in left foot 01/22/2020   Acquired thrombophilia ([place]) 12/30/2019   Pain in joint of right knee 12/27/2019   Recurrent falls 11/17/2019   Paroxysmal atrial fibrillation ([place]) 10/22/2019   Pain in right foot  10/14/2019   Falls 10/07/2019   Ataxia involving legs 08/12/2019   OSA on CPAP 06/20/2018   Urge incontinence 04/13/2018   Urinary urgency 04/13/2018   Pain in left knee 03/01/2018   Other intervertebral disc degeneration, lumbar region 06/16/2017   Acquired bilateral hammer toes 10/05/2015   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   Generalized anxiety disorder 05/12/2015   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[place]) 05/10/2015   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   History of left-sided carotid endarterectomy 04/10/2014   Essential hypertension 04/10/2014   Occlusion and stenosis of carotid artery without mention of cerebral infarction 12/15/2011   Habitual alcohol use 12/01/2011   Carotid artery stenosis, symptomatic 11/30/2011   Cerebral infarction ([place]) 11/29/2011   Hyperlipidemia     PCP: Berniece Pap  REFERRING PROVIDER: Berniece Pap  REFERRING DIAG: M54.42,M54.41,G89.29   Rationale for Evaluation and Treatment: Rehabilitation  THERAPY DIAG:  Repeated falls  Other abnormalities of gait and mobility  Contracture of muscle, multiple sites  ONSET DATE: 01/25/22  SUBJECTIVE:                                                                                                                                                                                           SUBJECTIVE STATEMENT: "Going ok"  PERTINENT HISTORY:  Cerebral infarct 10/21/21, lumbar fusion, recurrent falls   PAIN:  Are you having pain? Yes: NPRS scale: 0/10 Pain location: low back Pain description: ache  Aggravating factors: being on my feet  Relieving factors: movement  PRECAUTIONS: Fall  WEIGHT BEARING RESTRICTIONS: No  FALLS:  Has patient fallen in last 6 months? Yes. Number of falls every few days   LIVING ENVIRONMENT: Lives with: lives with their spouse Lives in: House/apartment Stairs: Yes: Internal: 20 steps; on right going up Has  following equipment at home: Gilford Rile - 4 wheeled and trekking pole  OCCUPATION: Retired  PLOF: Independent  PATIENT GOALS: to  not fall and go deer hunting again  NEXT MD VISIT:   OBJECTIVE:   DIAGNOSTIC FINDINGS:    LUMBAR SPINE FINDINGS: 1. Wide laminectomy and posterior pedicle screw and rod fixation at L4-5 without residual or recurrent stenosis. 2. Progressive adjacent level disease at L3-4 with moderate right and mild left subarticular narrowing and moderate foraminal stenosis bilaterally. Progression is predominantly due to facet disease 3. Progressive moderate facet hypertrophy at L1-2 with a progressive rightward disc protrusion resulting in progressive moderate right foraminal stenosis. Asymmetric endplate marrow edema on the right at L1-2 is consistent with progressive disease as well. 4. Otherwise stable degenerative change without other focal stenosis.   THORACIC SPINE FINDINGS:  On sagittal views the vertebral bodies have normal height and alignment.  Hemangioma within T9 vertebral body and slightly within T10 vertebral body.  Anterior kyphosis.  Mild scoliosis convex right centered at T8 level. The spinal cord is normal in size and appearance. The paraspinal soft tissues are unremarkable.     On axial views there is no spinal stenosis or foraminal narrowing.  Limited views of the aorta, kidneys, liver, lungs and paraspinal muscles are notable for multiple large right renal cysts ranging in size from 1.5 to 7 cm.   COGNITION: Overall cognitive status: Within functional limits for tasks assessed     SENSATION: WFL  MUSCLE LENGTH: Hamstrings: mod tightness on both sides  POSTURE: rounded shoulders and forward head  LUMBAR ROM:   AROM eval  Flexion Limited due to HS tightness  Extension WFL  Right lateral flexion Tightness in R side  Left lateral flexion WFL  Right rotation WFL  Left rotation Tightness on R side   (Blank rows = not tested)  LOWER  EXTREMITY ROM:   grossly WFL    LOWER EXTREMITY MMT:  grossly 4+/5   FUNCTIONAL TESTS:  5 times sit to stand: 20s Timed up and go (TUG): 16s  GAIT: Distance walked: in clinic distances Assistive device utilized:  trekking pole Level of assistance: SBA Comments: ataxic gait pattern, increased lateral sway, decreased safety awareness   TODAY'S TREATMENT:                                                                                                                              DATE:  03/16/22 Bike L4 x 6 min Side step over WaTE bar frequent LOB min assist to recover Side step and tandem walking in // bars, side step on and off airex in // bars Seated HS curls 45lb 3x12 Leg Ext 20lb 3x12 Seated Rows & Lats 45lb 2x12 Chest press 25lb 2x10 Leg press 60lb 3x12   03/10/22 Bike L4 x 6 min Resisted gait 20lb forward/backward x4, side step x4 Seated Rows & Lats 35lb 2x12 Chest press 15lb 2x15 Leg press 60lb 3x15  Alt 4 in box taps with trekking pole frequent LOB posteriorly  Seated HS curls 45lb 2x10 Leg Ext 20lb 2x10   03/07/22 NuStep  L5 x 6 min Seated Rows & Lats 35lb 2x10  Shoulder Ext 10lb 2x10 S2S on airex elevated mat 2x10 Alt 4 in box taps with trekking pole frequent LOB posteriorly  Side step and tandem walking on balance beam in // bars Seated HS curls 45lb 2x10 Leg Ext 20lb 3x10  03/04/22 NuStep L5 x 6 min Alt 6 & 4 in box taps with and without trekking pole frequent LOB posteriorly  Resisted gait 20lb forward/backward x4, side step x3 Leg press 60lb 4x15 "Pt requested weight" Seated HS curls 45lb 2x10 Leg Ext 10lb 2x10 S2S 2x10   03/01/22  NuStep L5  6 min S2S holding yellow ball 2x10 Seated HS curls 25lb 2x12 Leg Ext 10lb 2x10 Alt 6 in box taps frequent LOB posteriorly  Hip add ball squeeze 2x10 Standing ball toss frequent LOB Backwards walking    02/23/22- EVAL    PATIENT EDUCATION:  Education details: HEP and POC Person educated:  Patient Education method: Explanation Education comprehension: verbalized understanding  HOME EXERCISE PROGRAM: Marching, 3 way leg kicks, mini squats, heel raises, STS from chair   ASSESSMENT:  CLINICAL IMPRESSION: Patient is a 80 y.o. male who was seen today for physical therapy treatment. He enters feeling well. Continues to have balance issues losing his balance posteriorly with balance activities. Good righting reaction taking steps backwards when looses balance, but step are really slow.  No issues with machine level interventions.   OBJECTIVE IMPAIRMENTS: Abnormal gait, decreased balance, decreased coordination, and difficulty walking.   ACTIVITY LIMITATIONS: squatting, stairs, transfers, and locomotion level  REHAB POTENTIAL: Fair dependent on carry over and patient compliance  CLINICAL DECISION MAKING: Stable/uncomplicated  EVALUATION COMPLEXITY: Low  GOALS: Goals reviewed with patient? Yes  SHORT TERM GOALS: Target date: 04/06/22  Patient will be independent with initial HEP. Goal status: Met 03/07/22  2.  Patient will be educated on strategies to decrease risk of falls.  Goal status: 03/07/22   LONG TERM GOALS: Target date: 05/18/22  Patient will be independent with advanced/ongoing HEP to improve outcomes and carryover.  Goal status: INITIAL  2.  Patient will demonstrate decreased fall risk by scoring < 12 sec on TUG. Baseline: 16s Goal status: ongoing  3.  Patient will demonstrate improved functional LE strength as demonstrated by < 15s on 5xSTS. Baseline: 20s Goal status: ongoing  4.  Patient will score 42 on Berg Balance test to demonstrate lower risk of falls. (MCID= 8 points) .  Baseline: 36/56 Goal status: INITIAL  5.  Patient will report no falls in a 4 weeks period. Baseline: falls every few days Goal status: INITIAL  PLAN:  PT FREQUENCY: 2x/week  PT DURATION: 12 weeks  PLANNED INTERVENTIONS: Therapeutic exercises, Therapeutic activity,  Neuromuscular re-education, Balance training, Gait training, Patient/Family education, Self Care, Joint mobilization, Stair training, Cryotherapy, Moist heat, and Manual therapy.  PLAN FOR NEXT SESSION: balance and LE strengthening   Scot Jun, PTA 03/16/2022, 9:24 AM

## 2022-03-17 ENCOUNTER — Ambulatory Visit: Payer: Medicare Other

## 2022-03-18 ENCOUNTER — Ambulatory Visit: Payer: Medicare Other

## 2022-03-23 ENCOUNTER — Encounter: Payer: Self-pay | Admitting: Physical Therapy

## 2022-03-23 ENCOUNTER — Ambulatory Visit: Payer: Medicare Other | Admitting: Physical Therapy

## 2022-03-23 DIAGNOSIS — R2689 Other abnormalities of gait and mobility: Secondary | ICD-10-CM

## 2022-03-23 DIAGNOSIS — R296 Repeated falls: Secondary | ICD-10-CM | POA: Diagnosis not present

## 2022-03-23 DIAGNOSIS — M5459 Other low back pain: Secondary | ICD-10-CM

## 2022-03-23 NOTE — Therapy (Signed)
OUTPATIENT PHYSICAL THERAPY THORACOLUMBAR TREATMENT   Patient Name: Brandon Mendoza MRN: [id number] DOB:May 31, 1940, 81 y.o., male Today's Date: 03/23/2022  END OF SESSION:  PT End of Session - 03/23/22 0923     Visit Number 7    Date for PT Re-Evaluation 05/18/22    PT Start Time 0925    PT Stop Time 1010    PT Time Calculation (min) 45 min    Activity Tolerance Patient tolerated treatment well    Behavior During Therapy [facility] for tasks assessed/performed             Past Medical History:  Diagnosis Date   Acquired clawfoot, right foot 12/16/2021   Anxiety    Atrophy of calf muscles 10/21/2021   Duplex calves was negative for significant blockage [facility] MRI lumbar spine was also done at [facility]   Carotid artery occlusion    COPD (chronic obstructive pulmonary disease) (HCC)    Depression    Dysrhythmia    PAF, atrial tachycardia   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   The spinal cord appears normal. 2.   No spinal stenosis. 3.   Mild multilevel degenerative changes as detailed above that do not lead to spinal stenosis or nerve root compression. 4.   T2 hyperintense foci within the pons consistent with chronic microvascular ischemic changes.   Loop - Medtronic Linq 10/22/2020 10/22/2020   Nocturia    Peripheral vascular disease (HCC)    Sleep apnea    on 6 cm, nasal pillow   Stroke ([place]) 05/01/2011   ischemic   Weakness generalized 10/21/2021   Severe, legs and arms   Past Surgical History:  Procedure Laterality Date   CAROTID ENDARTERECTOMY  12/09/11   Left cea   ENDARTERECTOMY  12/09/2011   Procedure: ENDARTERECTOMY CAROTID;  Surgeon: Rosetta Posner, MD;  Location: [facility];  Service: Vascular;  Laterality: Left;   EYE SURGERY      rt retina detachment   HAMMER TOE SURGERY  2004   HERNIA REPAIR  2006   SHOULDER ARTHROSCOPY W/ ROTATOR CUFF REPAIR  2010   TONSILLECTOMY     Patient Active Problem List   Diagnosis Date Noted   Macular degeneration 03/01/2022   Myofascial pain 02/10/2022   Lumbar spondylosis 01/03/2022   Acquired clawfoot, left foot 12/16/2021   Acquired clawfoot, right foot 12/16/2021   Claw foot 12/14/2021   Gait abnormality 12/10/2021   Spinal stenosis at L4-L5 level 12/10/2021   Ataxia 11/22/2021   Lightheadedness 11/02/2021   Claudication of lower extremity ([place]) 10/21/2021   Atrophy of calf muscles 10/21/2021   Weakness generalized 10/21/2021   Focal neurological deficit 10/21/2021   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   Pain in joint of right hip 09/14/2021   High arches 07/22/2021   Hammertoes of both feet 07/22/2021   Neuropathy 07/22/2021   Constipation 05/28/2021   Disequilibrium 05/28/2021   Personal history of colonic polyps 05/28/2021   Renal cyst 05/28/2021   Gastroesophageal reflux disease without esophagitis 05/05/2021   Ingrowing left great toenail 02/15/2021   S/P lumbar fusion 12/24/2020   Spinal stenosis of lumbar region 12/04/2020   Acquired  complex renal cyst 11/23/2020   Functional gait abnormality 11/04/2020   Idiopathic neuropathy 11/02/2020   Tinea pedis of both feet 10/28/2020   Loop - Medtronic Linq 10/22/2020 10/22/2020   Benign hypertension with CKD (chronic kidney disease) stage III ([place]) 10/05/2020   Fall with injury 10/05/2020   Hematoma 09/16/2020   Normocytic anemia 09/14/2020   AF (paroxysmal atrial fibrillation) ([place]) 09/14/2020   Chronic anticoagulation 09/14/2020   Hyponatremia 09/14/2020   Pain in left foot 01/22/2020   Acquired thrombophilia ([place]) 12/30/2019   Pain in joint of right knee 12/27/2019   Recurrent falls 11/17/2019   Paroxysmal atrial fibrillation ([place]) 10/22/2019   Pain in right foot  10/14/2019   Falls 10/07/2019   Ataxia involving legs 08/12/2019   OSA on CPAP 06/20/2018   Urge incontinence 04/13/2018   Urinary urgency 04/13/2018   Pain in left knee 03/01/2018   Other intervertebral disc degeneration, lumbar region 06/16/2017   Acquired bilateral hammer toes 10/05/2015   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   Generalized anxiety disorder 05/12/2015   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[place]) 05/10/2015   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   History of left-sided carotid endarterectomy 04/10/2014   Essential hypertension 04/10/2014   Occlusion and stenosis of carotid artery without mention of cerebral infarction 12/15/2011   Habitual alcohol use 12/01/2011   Carotid artery stenosis, symptomatic 11/30/2011   Cerebral infarction ([place]) 11/29/2011   Hyperlipidemia     PCP: Berniece Pap  REFERRING PROVIDER: Berniece Pap  REFERRING DIAG: M54.42,M54.41,G89.29   Rationale for Evaluation and Treatment: Rehabilitation  THERAPY DIAG:  Repeated falls  Other abnormalities of gait and mobility  Other low back pain  ONSET DATE: 01/25/22  SUBJECTIVE:                                                                                                                                                                                           SUBJECTIVE STATEMENT: Went deer hunting Thursday, Had three falls when dragging the deer to a tree.No injuries some brushing on L thigh  PERTINENT HISTORY:  Cerebral infarct 10/21/21, lumbar fusion, recurrent falls   PAIN:  Are you having pain? Yes: NPRS scale: 0/10 Pain location: low back Pain description: ache  Aggravating factors: being on my feet  Relieving factors: movement  PRECAUTIONS: Fall  WEIGHT BEARING RESTRICTIONS: No  FALLS:  Has patient fallen in last 6 months? Yes. Number of falls every few days   LIVING ENVIRONMENT: Lives with: lives with their  spouse Lives in: House/apartment Stairs: Yes: Internal: 20 steps; on right going up Has following equipment at home:  Walker - 4 wheeled and trekking pole  OCCUPATION: Retired  PLOF: Independent  PATIENT GOALS: to not fall and go deer hunting again  NEXT MD VISIT:   OBJECTIVE:   DIAGNOSTIC FINDINGS:    LUMBAR SPINE FINDINGS: 1. Wide laminectomy and posterior pedicle screw and rod fixation at L4-5 without residual or recurrent stenosis. 2. Progressive adjacent level disease at L3-4 with moderate right and mild left subarticular narrowing and moderate foraminal stenosis bilaterally. Progression is predominantly due to facet disease 3. Progressive moderate facet hypertrophy at L1-2 with a progressive rightward disc protrusion resulting in progressive moderate right foraminal stenosis. Asymmetric endplate marrow edema on the right at L1-2 is consistent with progressive disease as well. 4. Otherwise stable degenerative change without other focal stenosis.   THORACIC SPINE FINDINGS:  On sagittal views the vertebral bodies have normal height and alignment.  Hemangioma within T9 vertebral body and slightly within T10 vertebral body.  Anterior kyphosis.  Mild scoliosis convex right centered at T8 level. The spinal cord is normal in size and appearance. The paraspinal soft tissues are unremarkable.     On axial views there is no spinal stenosis or foraminal narrowing.  Limited views of the aorta, kidneys, liver, lungs and paraspinal muscles are notable for multiple large right renal cysts ranging in size from 1.5 to 7 cm.   COGNITION: Overall cognitive status: Within functional limits for tasks assessed     SENSATION: WFL  MUSCLE LENGTH: Hamstrings: mod tightness on both sides  POSTURE: rounded shoulders and forward head  LUMBAR ROM:   AROM eval  Flexion Limited due to HS tightness  Extension WFL  Right lateral flexion Tightness in R side  Left lateral flexion WFL  Right  rotation WFL  Left rotation Tightness on R side   (Blank rows = not tested)  LOWER EXTREMITY ROM:   grossly WFL    LOWER EXTREMITY MMT:  grossly 4+/5   FUNCTIONAL TESTS:  5 times sit to stand: 20s Timed up and go (TUG): 16s  GAIT: Distance walked: in clinic distances Assistive device utilized:  trekking pole Level of assistance: SBA Comments: ataxic gait pattern, increased lateral sway, decreased safety awareness   TODAY'S TREATMENT:                                                                                                                              DATE:  03/23/22 NuStep L5 x 6 min Alt box taps 4in x10, 6in x10 with trekking pole  Resisted gait 20lb all directions x 4 S2S OHP yellow 2x10 Seated HS curls 45lb 3x12 Leg Ext 20lb 3x12 Leg Press 80lb 2x10, Heel raises 80lb 2x12   03/16/22 Bike L4 x 6 min Side step over WaTE bar frequent LOB min assist to recover Side step and tandem walking in // bars, side step on and off airex in // bars Seated HS curls 45lb 3x12 Leg Ext 20lb 3x12 Seated Rows & Lats 45lb 2x12  Chest press 25lb 2x10 Leg press 60lb 3x12   03/10/22 Bike L4 x 6 min Resisted gait 20lb forward/backward x4, side step x4 Seated Rows & Lats 35lb 2x12 Chest press 15lb 2x15 Leg press 60lb 3x15  Alt 4 in box taps with trekking pole frequent LOB posteriorly  Seated HS curls 45lb 2x10 Leg Ext 20lb 2x10   03/07/22 NuStep L5 x 6 min Seated Rows & Lats 35lb 2x10  Shoulder Ext 10lb 2x10 S2S on airex elevated mat 2x10 Alt 4 in box taps with trekking pole frequent LOB posteriorly  Side step and tandem walking on balance beam in // bars Seated HS curls 45lb 2x10 Leg Ext 20lb 3x10  03/04/22 NuStep L5 x 6 min Alt 6 & 4 in box taps with and without trekking pole frequent LOB posteriorly  Resisted gait 20lb forward/backward x4, side step x3 Leg press 60lb 4x15 "Pt requested weight" Seated HS curls 45lb 2x10 Leg Ext 10lb 2x10 S2S 2x10   03/01/22   NuStep L5  6 min S2S holding yellow ball 2x10 Seated HS curls 25lb 2x12 Leg Ext 10lb 2x10 Alt 6 in box taps frequent LOB posteriorly  Hip add ball squeeze 2x10 Standing ball toss frequent LOB Backwards walking    02/23/22- EVAL    PATIENT EDUCATION:  Education details: HEP and POC Person educated: Patient Education method: Explanation Education comprehension: verbalized understanding  HOME EXERCISE PROGRAM: Marching, 3 way leg kicks, mini squats, heel raises, STS from chair   ASSESSMENT:  CLINICAL IMPRESSION: Patient is a 81 y.o. male who was seen today for physical therapy treatment. He enters feeling well. Continues to have balance issues overall. Reports 3 falls puling a deer last Thursday. He did a lot better with alt box taps and resisted gait. No issues with machine level interventions.   OBJECTIVE IMPAIRMENTS: Abnormal gait, decreased balance, decreased coordination, and difficulty walking.   ACTIVITY LIMITATIONS: squatting, stairs, transfers, and locomotion level  REHAB POTENTIAL: Fair dependent on carry over and patient compliance  CLINICAL DECISION MAKING: Stable/uncomplicated  EVALUATION COMPLEXITY: Low  GOALS: Goals reviewed with patient? Yes  SHORT TERM GOALS: Target date: 04/06/22  Patient will be independent with initial HEP. Goal status: Met 03/07/22  2.  Patient will be educated on strategies to decrease risk of falls.  Goal status: 03/07/22   LONG TERM GOALS: Target date: 05/18/22  Patient will be independent with advanced/ongoing HEP to improve outcomes and carryover.  Goal status: INITIAL  2.  Patient will demonstrate decreased fall risk by scoring < 12 sec on TUG. Baseline: 16s Goal status: ongoing  3.  Patient will demonstrate improved functional LE strength as demonstrated by < 15s on 5xSTS. Baseline: 20s Goal status: ongoing  4.  Patient will score 42 on Berg Balance test to demonstrate lower risk of falls. (MCID= 8 points) .   Baseline: 36/56 Goal status: INITIAL  5.  Patient will report no falls in a 4 weeks period. Baseline: falls every few days Goal status: INITIAL  PLAN:  PT FREQUENCY: 2x/week  PT DURATION: 12 weeks  PLANNED INTERVENTIONS: Therapeutic exercises, Therapeutic activity, Neuromuscular re-education, Balance training, Gait training, Patient/Family education, Self Care, Joint mobilization, Stair training, Cryotherapy, Moist heat, and Manual therapy.  PLAN FOR NEXT SESSION: balance and LE strengthening   Scot Jun, PTA 03/23/2022, 9:24 AM

## 2022-03-24 LAB — MULTIPLE MYELOMA PANEL, SERUM
Albumin SerPl Elph-Mcnc: 4.1 g/dL (ref 2.9–4.4)
Albumin/Glob SerPl: 1.4 (ref 0.7–1.7)
Alpha 1: 0.3 g/dL (ref 0.0–0.4)
Alpha2 Glob SerPl Elph-Mcnc: 0.8 g/dL (ref 0.4–1.0)
B-Globulin SerPl Elph-Mcnc: 0.9 g/dL (ref 0.7–1.3)
Gamma Glob SerPl Elph-Mcnc: 1 g/dL (ref 0.4–1.8)
Globulin, Total: 3 g/dL (ref 2.2–3.9)
IgA/Immunoglobulin A, Serum: 169 mg/dL (ref 61–437)
IgG (Immunoglobin G), Serum: 1004 mg/dL (ref 603–1613)
IgM (Immunoglobulin M), Srm: 80 mg/dL (ref 15–143)
Total Protein: 7.1 g/dL (ref 6.0–8.5)

## 2022-03-24 LAB — C-REACTIVE PROTEIN: CRP: 2 mg/L (ref 0–10)

## 2022-03-24 LAB — ANA W/REFLEX IF POSITIVE: Anti Nuclear Antibody (ANA): NEGATIVE

## 2022-03-24 LAB — CK: Total CK: 68 U/L (ref 30–208)

## 2022-03-24 LAB — SEDIMENTATION RATE

## 2022-03-24 LAB — HGB A1C W/O EAG: Hgb A1c MFr Bld: 5.5 % (ref 4.8–5.6)

## 2022-03-25 ENCOUNTER — Encounter: Payer: Self-pay | Admitting: Physical Therapy

## 2022-03-25 ENCOUNTER — Ambulatory Visit: Payer: Medicare Other | Admitting: Physical Therapy

## 2022-03-25 DIAGNOSIS — M5459 Other low back pain: Secondary | ICD-10-CM

## 2022-03-25 DIAGNOSIS — R296 Repeated falls: Secondary | ICD-10-CM | POA: Diagnosis not present

## 2022-03-25 DIAGNOSIS — R2689 Other abnormalities of gait and mobility: Secondary | ICD-10-CM

## 2022-03-25 NOTE — Therapy (Signed)
OUTPATIENT PHYSICAL THERAPY THORACOLUMBAR TREATMENT   Patient Name: Brandon Mendoza MRN: [id number] DOB:06/02/1940, 81 y.o., male Today's Date: 03/25/2022  END OF SESSION:  PT End of Session - 03/25/22 0838     Visit Number 8    Date for PT Re-Evaluation 05/18/22    PT Start Time 0840    PT Stop Time 0925    PT Time Calculation (min) 45 min    Activity Tolerance Patient tolerated treatment well    Behavior During Therapy [facility] for tasks assessed/performed             Past Medical History:  Diagnosis Date   Acquired clawfoot, right foot 12/16/2021   Anxiety    Atrophy of calf muscles 10/21/2021   Duplex calves was negative for significant blockage [facility] at [facility] spine was [facility] at [facility]   Carotid artery occlusion    COPD (chronic obstructive pulmonary disease) (HCC)    Depression    Dysrhythmia    PAF, atrial tachycardia   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   The spinal cord appears normal. 2.   No spinal stenosis. 3.   Mild multilevel degenerative changes as detailed above that do not lead to spinal stenosis or nerve root compression. 4.   T2 hyperintense foci within the pons consistent with chronic microvascular ischemic changes.   Loop - Medtronic Linq 10/22/2020 10/22/2020   Nocturia    Peripheral vascular disease (HCC)    Sleep apnea    on 6 cm, nasal pillow   Stroke ([place]) 05/01/2011   ischemic   Weakness generalized 10/21/2021   Severe, legs and arms   Past Surgical History:  Procedure Laterality Date   CAROTID ENDARTERECTOMY  12/09/11   Left cea   ENDARTERECTOMY  12/09/2011   Procedure: ENDARTERECTOMY CAROTID;  Surgeon: Rosetta Posner, MD;  Location: [facility];  Service: Vascular;  Laterality: Left;   EYE SURGERY      rt retina detachment   HAMMER TOE SURGERY  2004   HERNIA REPAIR  2006   SHOULDER ARTHROSCOPY W/ ROTATOR CUFF REPAIR  2010   TONSILLECTOMY     Patient Active Problem List   Diagnosis Date Noted   Macular degeneration 03/01/2022   Myofascial pain 02/10/2022   Lumbar spondylosis 01/03/2022   Acquired clawfoot, left foot 12/16/2021   Acquired clawfoot, right foot 12/16/2021   Claw foot 12/14/2021   Gait abnormality 12/10/2021   Spinal stenosis at L4-L5 level 12/10/2021   Ataxia 11/22/2021   Lightheadedness 11/02/2021   Claudication of lower extremity ([place]) 10/21/2021   Atrophy of calf muscles 10/21/2021   Weakness generalized 10/21/2021   Focal neurological deficit 10/21/2021   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   Pain in joint of right hip 09/14/2021   High arches 07/22/2021   Hammertoes of both feet 07/22/2021   Neuropathy 07/22/2021   Constipation 05/28/2021   Disequilibrium 05/28/2021   Personal history of colonic polyps 05/28/2021   Renal cyst 05/28/2021   Gastroesophageal reflux disease without esophagitis 05/05/2021   Ingrowing left great toenail 02/15/2021   S/P lumbar fusion 12/24/2020   Spinal stenosis of lumbar region 12/04/2020   Acquired  complex renal cyst 11/23/2020   Functional gait abnormality 11/04/2020   Idiopathic neuropathy 11/02/2020   Tinea pedis of both feet 10/28/2020   Loop - Medtronic Linq 10/22/2020 10/22/2020   Benign hypertension with CKD (chronic kidney disease) stage III ([place]) 10/05/2020   Fall with injury 10/05/2020   Hematoma 09/16/2020   Normocytic anemia 09/14/2020   AF (paroxysmal atrial fibrillation) ([place]) 09/14/2020   Chronic anticoagulation 09/14/2020   Hyponatremia 09/14/2020   Pain in left foot 01/22/2020   Acquired thrombophilia ([place]) 12/30/2019   Pain in joint of right knee 12/27/2019   Recurrent falls 11/17/2019   Paroxysmal atrial fibrillation ([place]) 10/22/2019   Pain in right foot  10/14/2019   Falls 10/07/2019   Ataxia involving legs 08/12/2019   OSA on CPAP 06/20/2018   Urge incontinence 04/13/2018   Urinary urgency 04/13/2018   Pain in left knee 03/01/2018   Other intervertebral disc degeneration, lumbar region 06/16/2017   Acquired bilateral hammer toes 10/05/2015   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   Generalized anxiety disorder 05/12/2015   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[place]) 05/10/2015   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/10/2014   History of left-sided carotid endarterectomy 04/10/2014   Essential hypertension 04/10/2014   Occlusion and stenosis of carotid artery without mention of cerebral infarction 12/15/2011   Habitual alcohol use 12/01/2011   Carotid artery stenosis, symptomatic 11/30/2011   Cerebral infarction ([place]) 11/29/2011   Hyperlipidemia     PCP: Berniece Pap  REFERRING PROVIDER: Berniece Pap  REFERRING DIAG: M54.42,M54.41,G89.29   Rationale for Evaluation and Treatment: Rehabilitation  THERAPY DIAG:  Repeated falls  Other low back pain  Other abnormalities of gait and mobility  ONSET DATE: 01/25/22  SUBJECTIVE:                                                                                                                                                                                           SUBJECTIVE STATEMENT: Calves are sore form last session. No falls but several near falls  PERTINENT HISTORY:  Cerebral infarct 10/21/21, lumbar fusion, recurrent falls   PAIN:  Are you having pain? Yes: NPRS scale: 0/10 Pain location: low back Pain description: ache  Aggravating factors: being on my feet  Relieving factors: movement  PRECAUTIONS: Fall  WEIGHT BEARING RESTRICTIONS: No  FALLS:  Has patient fallen in last 6 months? Yes. Number of falls every few days   LIVING ENVIRONMENT: Lives with: lives with their spouse Lives in: House/apartment Stairs: Yes:  Internal: 20 steps; on right going up Has following equipment at home: Gilford Rile - 4 wheeled and trekking pole  OCCUPATION: Retired  PLOF: Independent  PATIENT GOALS: to not fall and go deer hunting again  NEXT MD VISIT:   OBJECTIVE:   DIAGNOSTIC FINDINGS:    LUMBAR SPINE FINDINGS: 1. Wide laminectomy and posterior pedicle screw and rod fixation at L4-5 without residual or recurrent stenosis. 2. Progressive adjacent level disease at L3-4 with moderate right and mild left subarticular narrowing and moderate foraminal stenosis bilaterally. Progression is predominantly due to facet disease 3. Progressive moderate facet hypertrophy at L1-2 with a progressive rightward disc protrusion resulting in progressive moderate right foraminal stenosis. Asymmetric endplate marrow edema on the right at L1-2 is consistent with progressive disease as well. 4. Otherwise stable degenerative change without other focal stenosis.   THORACIC SPINE FINDINGS:  On sagittal views the vertebral bodies have normal height and alignment.  Hemangioma within T9 vertebral body and slightly within T10 vertebral body.  Anterior kyphosis.  Mild scoliosis convex right centered at T8 level. The spinal cord is normal in size and appearance. The paraspinal soft tissues are unremarkable.     On axial views there is no spinal stenosis or foraminal narrowing.  Limited views of the aorta, kidneys, liver, lungs and paraspinal muscles are notable for multiple large right renal cysts ranging in size from 1.5 to 7 cm.   COGNITION: Overall cognitive status: Within functional limits for tasks assessed     SENSATION: WFL  MUSCLE LENGTH: Hamstrings: mod tightness on both sides  POSTURE: rounded shoulders and forward head  LUMBAR ROM:   AROM eval  Flexion Limited due to HS tightness  Extension WFL  Right lateral flexion Tightness in R side  Left lateral flexion WFL  Right rotation WFL  Left rotation Tightness on R side    (Blank rows = not tested)  LOWER EXTREMITY ROM:   grossly WFL    LOWER EXTREMITY MMT:  grossly 4+/5   FUNCTIONAL TESTS:  5 times sit to stand: 20s Timed up and go (TUG): 16s  GAIT: Distance walked: in clinic distances Assistive device utilized:  trekking pole Level of assistance: SBA Comments: ataxic gait pattern, increased lateral sway, decreased safety awareness   TODAY'S TREATMENT:                                                                                                                              DATE:  03/25/22 NuStep L5 x6 min  Leg press 80lb 2x12  Seated Rows and lats 35lb 3x10 Chest press 35lb 2x10, 25lb x10 S2S LE on airex 2x10 Shoulder Ext 10lb 2x10 Calf stretch     03/23/22 NuStep L5 x 6 min Alt box taps 4in x10, 6in x10 with trekking pole  Resisted gait 20lb all directions x 4 S2S OHP yellow 2x10 Seated HS curls 45lb 3x12 Leg Ext 20lb 3x12 Leg Press 80lb 2x10, Heel raises 80lb 2x12   03/16/22 Bike L4 x 6 min Side step over WaTE bar frequent LOB min assist to recover Side step  and tandem walking in // bars, side step on and off airex in // bars Seated HS curls 45lb 3x12 Leg Ext 20lb 3x12 Seated Rows & Lats 45lb 2x12 Chest press 25lb 2x10 Leg press 60lb 3x12   03/10/22 Bike L4 x 6 min Resisted gait 20lb forward/backward x4, side step x4 Seated Rows & Lats 35lb 2x12 Chest press 15lb 2x15 Leg press 60lb 3x15  Alt 4 in box taps with trekking pole frequent LOB posteriorly  Seated HS curls 45lb 2x10 Leg Ext 20lb 2x10   03/07/22 NuStep L5 x 6 min Seated Rows & Lats 35lb 2x10  Shoulder Ext 10lb 2x10 S2S on airex elevated mat 2x10 Alt 4 in box taps with trekking pole frequent LOB posteriorly  Side step and tandem walking on balance beam in // bars Seated HS curls 45lb 2x10 Leg Ext 20lb 3x10   PATIENT EDUCATION:  Education details: HEP and POC Person educated: Patient Education method: Explanation Education comprehension:  verbalized understanding  HOME EXERCISE PROGRAM: Marching, 3 way leg kicks, mini squats, heel raises, STS from chair   ASSESSMENT:  CLINICAL IMPRESSION: Patient is a 81 y.o. male who was seen today for physical therapy treatment. He enters feeling well. Continues to have balance issues overall. He has progressed decreasing his time for TUG and 5x sit to stand/. He did meet 5x sit to stand goal. Good strength with all machine level interventions.  OBJECTIVE IMPAIRMENTS: Abnormal gait, decreased balance, decreased coordination, and difficulty walking.   ACTIVITY LIMITATIONS: squatting, stairs, transfers, and locomotion level  REHAB POTENTIAL: Fair dependent on carry over and patient compliance  CLINICAL DECISION MAKING: Stable/uncomplicated  EVALUATION COMPLEXITY: Low  GOALS: Goals reviewed with patient? Yes  SHORT TERM GOALS: Target date: 04/06/22  Patient will be independent with initial HEP. Goal status: Met 03/07/22  2.  Patient will be educated on strategies to decrease risk of falls.  Goal status: 03/07/22   LONG TERM GOALS: Target date: 05/18/22  Patient will be independent with advanced/ongoing HEP to improve outcomes and carryover.  Goal status: Met  2.  Patient will demonstrate decreased fall risk by scoring < 12 sec on TUG. Baseline: 16s Goal status: Progressing 12/29/3 - 13.33 sec  3.  Patient will demonstrate improved functional LE strength as demonstrated by < 15s on 5xSTS. Baseline: 20s Goal status: Met 11.09 03/25/22  4.  Patient will score 42 on Berg Balance test to demonstrate lower risk of falls. (MCID= 8 points) .  Baseline: 36/56 Goal status: INITIAL  5.  Patient will report no falls in a 4 weeks period. Baseline: falls every few days Goal status: INITIAL  PLAN:  PT FREQUENCY: 2x/week  PT DURATION: 12 weeks  PLANNED INTERVENTIONS: Therapeutic exercises, Therapeutic activity, Neuromuscular re-education, Balance training, Gait training,  Patient/Family education, Self Care, Joint mobilization, Stair training, Cryotherapy, Moist heat, and Manual therapy.  PLAN FOR NEXT SESSION: balance and LE strengthening   Scot Jun, PTA 03/25/2022, 8:39 AM

## 2022-03-29 ENCOUNTER — Ambulatory Visit: Payer: Medicare Other | Attending: Internal Medicine | Admitting: Physical Therapy

## 2022-03-29 ENCOUNTER — Encounter: Payer: Self-pay | Admitting: Physical Therapy

## 2022-03-29 DIAGNOSIS — M6249 Contracture of muscle, multiple sites: Secondary | ICD-10-CM | POA: Insufficient documentation

## 2022-03-29 DIAGNOSIS — M5459 Other low back pain: Secondary | ICD-10-CM | POA: Insufficient documentation

## 2022-03-29 DIAGNOSIS — R2689 Other abnormalities of gait and mobility: Secondary | ICD-10-CM | POA: Diagnosis present

## 2022-03-29 DIAGNOSIS — R296 Repeated falls: Secondary | ICD-10-CM | POA: Diagnosis present

## 2022-03-29 NOTE — Therapy (Signed)
OUTPATIENT PHYSICAL THERAPY THORACOLUMBAR TREATMENT   Patient Name: Brandon Mendoza MRN: [id number] DOB:[birth year], 82 y.o., male Today's Date: 03/29/2022  END OF SESSION:  PT End of Session - 03/29/22 0929     Visit Number 9    Date for PT Re-Evaluation 05/18/22    PT Start Time 0930    PT Stop Time 2902    PT Time Calculation (min) 45 min    Activity Tolerance Patient tolerated treatment well    Behavior During Therapy [facility] for tasks assessed/performed             Past Medical History:  Diagnosis Date   Acquired clawfoot, right foot 12/16/2021   Anxiety    Atrophy of calf muscles 10/21/2021   Duplex calves was negative for significant blockage [facility] MRI lumbar spine was also done at [facility]   Carotid artery occlusion    COPD (chronic obstructive pulmonary disease) (HCC)    Depression    Dysrhythmia    PAF, atrial tachycardia   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   The spinal cord appears normal. 2.   No spinal stenosis. 3.   Mild multilevel degenerative changes as detailed above that do not lead to spinal stenosis or nerve root compression. 4.   T2 hyperintense foci within the pons consistent with chronic microvascular ischemic changes.   Loop - Medtronic Linq 10/22/2020 10/22/2020   Nocturia    Peripheral vascular disease (HCC)    Sleep apnea    on 6 cm, nasal pillow   Stroke ([place]) 05/01/2011   ischemic   Weakness generalized 10/21/2021   Severe, legs and arms   Past Surgical History:  Procedure Laterality Date   CAROTID ENDARTERECTOMY  12/09/11   Left cea   ENDARTERECTOMY  12/09/2011   Procedure: ENDARTERECTOMY CAROTID;  Surgeon: Rosetta Posner, MD;  Location: [place];  Service: Vascular;  Laterality: Left;   EYE SURGERY      rt retina detachment   HAMMER TOE SURGERY  2004   HERNIA REPAIR  2006   SHOULDER ARTHROSCOPY W/ ROTATOR CUFF REPAIR  2010   TONSILLECTOMY     Patient Active Problem List   Diagnosis Date Noted   Macular degeneration 03/01/2022   Myofascial pain 02/10/2022   Lumbar spondylosis 01/03/2022   Acquired clawfoot, left foot 12/16/2021   Acquired clawfoot, right foot 12/16/2021   Claw foot 12/14/2021   Gait abnormality 12/10/2021   Spinal stenosis at L4-L5 level 12/10/2021   Ataxia 11/22/2021   Lightheadedness 11/02/2021   Claudication of lower extremity ([place]) 10/21/2021   Atrophy of calf muscles 10/21/2021   Weakness generalized 10/21/2021   Focal neurological deficit 10/21/2021   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   Pain in joint of right hip 09/14/2021   High arches 07/22/2021   Hammertoes of both feet 07/22/2021   Neuropathy 07/22/2021   Constipation 05/28/2021   Disequilibrium 05/28/2021   Personal history of colonic polyps 05/28/2021   Renal cyst 05/28/2021   Gastroesophageal reflux disease without esophagitis 05/05/2021   Ingrowing left great toenail 02/15/2021   S/P lumbar fusion 12/24/2020   Spinal stenosis of lumbar region 12/04/2020   Acquired  complex renal cyst 11/23/2020   Functional gait abnormality 11/04/2020   Idiopathic neuropathy 11/02/2020   Tinea pedis of both feet 10/28/2020   Loop - Medtronic Linq 10/22/2020 10/22/2020   Benign hypertension with CKD (chronic kidney disease) stage III ([place]) 10/05/2020   Fall with injury 10/05/2020   Hematoma 09/16/2020   Normocytic anemia 09/14/2020   AF (paroxysmal atrial fibrillation) ([place]) 09/14/2020   Chronic anticoagulation 09/14/2020   Hyponatremia 09/14/2020   Pain in left foot 01/22/2020   Acquired thrombophilia ([place]) 12/30/2019   Pain in joint of right knee 12/27/2019   Recurrent falls 11/17/2019   Paroxysmal atrial fibrillation ([place]) 10/22/2019   Pain in right foot  10/14/2019   Falls 10/07/2019   Ataxia involving legs 08/12/2019   OSA on CPAP 06/20/2018   Urge incontinence 04/13/2018   Urinary urgency 04/13/2018   Pain in left knee 03/01/2018   Other intervertebral disc degeneration, lumbar region 06/16/2017   Acquired bilateral hammer toes 10/05/2015   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   Generalized anxiety disorder 05/12/2015   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[place]) 05/10/2015   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[place]) 04/10/2014   History of left-sided carotid endarterectomy 04/10/2014   Essential hypertension 04/10/2014   Occlusion and stenosis of carotid artery without mention of cerebral infarction 12/15/2011   Habitual alcohol use 12/01/2011   Carotid artery stenosis, symptomatic 11/30/2011   Cerebral infarction ([place]) 11/29/2011   Hyperlipidemia     PCP: Berniece Pap  REFERRING PROVIDER: Berniece Pap  REFERRING DIAG: M54.42,M54.41,G89.29   Rationale for Evaluation and Treatment: Rehabilitation  THERAPY DIAG:  Repeated falls  Other low back pain  Contracture of muscle, multiple sites  ONSET DATE: 01/25/22  SUBJECTIVE:                                                                                                                                                                                           SUBJECTIVE STATEMENT: Doing good, No falls recently   PERTINENT HISTORY:  Cerebral infarct 10/21/21, lumbar fusion, recurrent falls   PAIN:  Are you having pain? Yes: NPRS scale: 0/10 Pain location: low back Pain description: ache  Aggravating factors: being on my feet  Relieving factors: movement  PRECAUTIONS: Fall  WEIGHT BEARING RESTRICTIONS: No  FALLS:  Has patient fallen in last 6 months? Yes. Number of falls every few days   LIVING ENVIRONMENT: Lives with: lives with their spouse Lives in: House/apartment Stairs: Yes: Internal: 20 steps; on right going up Has  following equipment at home: Gilford Rile - 4 wheeled and trekking pole  OCCUPATION: Retired  PLOF: Independent  PATIENT  GOALS: to not fall and go deer hunting again  NEXT MD VISIT:   OBJECTIVE:   DIAGNOSTIC FINDINGS:    LUMBAR SPINE FINDINGS: 1. Wide laminectomy and posterior pedicle screw and rod fixation at L4-5 without residual or recurrent stenosis. 2. Progressive adjacent level disease at L3-4 with moderate right and mild left subarticular narrowing and moderate foraminal stenosis bilaterally. Progression is predominantly due to facet disease 3. Progressive moderate facet hypertrophy at L1-2 with a progressive rightward disc protrusion resulting in progressive moderate right foraminal stenosis. Asymmetric endplate marrow edema on the right at L1-2 is consistent with progressive disease as well. 4. Otherwise stable degenerative change without other focal stenosis.   THORACIC SPINE FINDINGS:  On sagittal views the vertebral bodies have normal height and alignment.  Hemangioma within T9 vertebral body and slightly within T10 vertebral body.  Anterior kyphosis.  Mild scoliosis convex right centered at T8 level. The spinal cord is normal in size and appearance. The paraspinal soft tissues are unremarkable.     On axial views there is no spinal stenosis or foraminal narrowing.  Limited views of the aorta, kidneys, liver, lungs and paraspinal muscles are notable for multiple large right renal cysts ranging in size from 1.5 to 7 cm.   COGNITION: Overall cognitive status: Within functional limits for tasks assessed     SENSATION: WFL  MUSCLE LENGTH: Hamstrings: mod tightness on both sides  POSTURE: rounded shoulders and forward head  LUMBAR ROM:   AROM eval  Flexion Limited due to HS tightness  Extension WFL  Right lateral flexion Tightness in R side  Left lateral flexion WFL  Right rotation WFL  Left rotation Tightness on R side   (Blank rows = not tested)  LOWER  EXTREMITY ROM:   grossly WFL    LOWER EXTREMITY MMT:  grossly 4+/5   FUNCTIONAL TESTS:  5 times sit to stand: 20s Timed up and go (TUG): 16s  GAIT: Distance walked: in clinic distances Assistive device utilized:  trekking pole Level of assistance: SBA Comments: ataxic gait pattern, increased lateral sway, decreased safety awareness   TODAY'S TREATMENT:                                                                                                                              DATE:  03/29/21 Bike L3 x 6 min Shoulder Ext 10lb 2x10 Alt 6in box taps x10 each Lateral 4 in step ups S2S on Airex pad 2x10 On airex w/ ball toss Side step in // bars on airex Tandem walking in // bars  Hamstring curls 45lb 3x12 Leg Ext 15lb 3x12   03/25/22 NuStep L5 x6 min  Leg press 80lb 2x12  Seated Rows and lats 35lb 3x10 Chest press 35lb 2x10, 25lb x10 S2S LE on airex 2x10 Shoulder Ext 10lb 2x10 Calf stretch     03/23/22 NuStep L5 x 6 min Alt box taps 4in x10, 6in x10 with trekking pole  Resisted gait  20lb all directions x 4 S2S OHP yellow 2x10 Seated HS curls 45lb 3x12 Leg Ext 20lb 3x12 Leg Press 80lb 2x10, Heel raises 80lb 2x12   03/16/22 Bike L4 x 6 min Side step over WaTE bar frequent LOB min assist to recover Side step and tandem walking in // bars, side step on and off airex in // bars Seated HS curls 45lb 3x12 Leg Ext 20lb 3x12 Seated Rows & Lats 45lb 2x12 Chest press 25lb 2x10 Leg press 60lb 3x12   03/10/22 Bike L4 x 6 min Resisted gait 20lb forward/backward x4, side step x4 Seated Rows & Lats 35lb 2x12 Chest press 15lb 2x15 Leg press 60lb 3x15  Alt 4 in box taps with trekking pole frequent LOB posteriorly  Seated HS curls 45lb 2x10 Leg Ext 20lb 2x10   03/07/22 NuStep L5 x 6 min Seated Rows & Lats 35lb 2x10  Shoulder Ext 10lb 2x10 S2S on airex elevated mat 2x10 Alt 4 in box taps with trekking pole frequent LOB posteriorly  Side step and tandem walking on  balance beam in // bars Seated HS curls 45lb 2x10 Leg Ext 20lb 3x10   PATIENT EDUCATION:  Education details: HEP and POC Person educated: Patient Education method: Explanation Education comprehension: verbalized understanding  HOME EXERCISE PROGRAM: Marching, 3 way leg kicks, mini squats, heel raises, STS from chair   ASSESSMENT:  CLINICAL IMPRESSION: Patient is a 82 y.o. male who was seen today for physical therapy treatment. He enters feeling well. Continues to have balance issues overall.  He did better oveall with alt box taps compared to last session. Some instability with lateral step ups requiring assist to maintain balance. Very difficult time with side steps and tandem walking. Good strength with machine level interventions.    OBJECTIVE IMPAIRMENTS: Abnormal gait, decreased balance, decreased coordination, and difficulty walking.   ACTIVITY LIMITATIONS: squatting, stairs, transfers, and locomotion level  REHAB POTENTIAL: Fair dependent on carry over and patient compliance  CLINICAL DECISION MAKING: Stable/uncomplicated  EVALUATION COMPLEXITY: Low  GOALS: Goals reviewed with patient? Yes  SHORT TERM GOALS: Target date: 04/06/22  Patient will be independent with initial HEP. Goal status: Met 03/07/22  2.  Patient will be educated on strategies to decrease risk of falls.  Goal status: 03/07/22   LONG TERM GOALS: Target date: 05/18/22  Patient will be independent with advanced/ongoing HEP to improve outcomes and carryover.  Goal status: Met  2.  Patient will demonstrate decreased fall risk by scoring < 12 sec on TUG. Baseline: 16s Goal status: Progressing 12/29/3 - 13.33 sec  3.  Patient will demonstrate improved functional LE strength as demonstrated by < 15s on 5xSTS. Baseline: 20s Goal status: Met 11.09 03/25/22  4.  Patient will score 42 on Berg Balance test to demonstrate lower risk of falls. (MCID= 8 points) .  Baseline: 36/56 Goal status:  INITIAL  5.  Patient will report no falls in a 4 weeks period. Baseline: falls every few days Goal status: INITIAL  PLAN:  PT FREQUENCY: 2x/week  PT DURATION: 12 weeks  PLANNED INTERVENTIONS: Therapeutic exercises, Therapeutic activity, Neuromuscular re-education, Balance training, Gait training, Patient/Family education, Self Care, Joint mobilization, Stair training, Cryotherapy, Moist heat, and Manual therapy.  PLAN FOR NEXT SESSION: balance and LE strengthening   Scot Jun, PTA 03/29/2022, 9:30 AM

## 2022-04-05 ENCOUNTER — Ambulatory Visit: Payer: Medicare Other | Admitting: Physical Therapy

## 2022-04-05 ENCOUNTER — Encounter: Payer: Self-pay | Admitting: Physical Therapy

## 2022-04-05 DIAGNOSIS — M5459 Other low back pain: Secondary | ICD-10-CM

## 2022-04-05 DIAGNOSIS — R296 Repeated falls: Secondary | ICD-10-CM | POA: Diagnosis not present

## 2022-04-05 DIAGNOSIS — M6249 Contracture of muscle, multiple sites: Secondary | ICD-10-CM

## 2022-04-05 DIAGNOSIS — R2689 Other abnormalities of gait and mobility: Secondary | ICD-10-CM

## 2022-04-05 NOTE — Progress Notes (Unsigned)
Primary Physician/Referring:  Loralee Pacas, MD  Patient ID: Brandon Mendoza, male    DOB: 1940/05/23, 82 y.o.   MRN: [id number]  No chief complaint on file.  HPI:    Brandon Mendoza  is a 82 y.o. Caucasian male patient with hypertension, hyperlipidemia, remote stroke with left carotid endarterectomy and follows Dr. Gae Gallop, OSA on CPAP, compliant, remote tobacco use disorder, history of gait instability and frequent fall related to burnt out spinal stenosis and peripheral neuropathy, paroxysmal atrial fibrillation, not on anticoagulation in view of spontaneous muscle hematoma and frequent falls.  He also underwent loop recorder implantation for evaluation of bradycardia and frequent fall on 10/22/2020.     His main issue continues to be weakness in his lower extremities and muscle weakness.  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 No chest pain, dyspnea.   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   The spinal cord appears normal. 2.   No spinal stenosis. 3.   Mild multilevel degenerative changes as detailed above that do not lead to spinal stenosis or nerve root compression. 4.   T2 hyperintense foci within the pons consistent with chronic microvascular ischemic changes.   Loop - Medtronic Linq 10/22/2020 10/22/2020   Nocturia    Peripheral  vascular disease (HCC)    Sleep apnea    on 6 cm, nasal pillow   Stroke ([place]) 05/01/2011   ischemic   Weakness generalized 10/21/2021   Severe, legs and arms     Social History   Tobacco Use   Smoking status: Former    Packs/day: 1.00    Years: 50.00    Total pack years: 50.00    Types: Cigarettes    Quit date: 08/26/2004    Years since quitting: 17.6    Passive exposure: Never   Smokeless tobacco: Never   Tobacco comments:    quit smoking 08/2004  Substance Use Topics   Alcohol use: Yes    Alcohol/week: 7.0 standard drinks of alcohol    Types: 7 Shots of liquor per week    Comment: 2-3 daily cocktails   Marital Status: Married  ROS  Review of Systems  Cardiovascular:  Negative for chest pain, dyspnea on exertion and leg swelling.  Gastrointestinal:  Negative for melena.  Neurological:  Positive for focal weakness (legs) and loss of balance.   Objective  There were no vitals taken for this visit. There is no height or weight on file to calculate BMI.     03/09/2022    9:04 AM 03/01/2022    9:17 AM 01/05/2022    8:30 AM  Vitals with BMI  Height  5\' 9"  5\' 9"   Weight  189 lbs 10 oz   BMI  27.99   Systolic 151 110 010  Diastolic 90 78 60  Pulse 75 68     No data found.   Physical Exam Neck:     Vascular: No carotid bruit (Left CEA scar noted) or JVD.  Cardiovascular:     Rate and Rhythm: Normal rate and regular rhythm.     Pulses: Intact distal pulses.     Heart sounds: Normal heart sounds. No murmur heard.    No gallop.  Pulmonary:     Effort: Pulmonary effort is normal.     Breath sounds: Normal breath sounds.  Abdominal:     General: Bowel sounds are normal.     Palpations: Abdomen is soft.  Musculoskeletal:        General: No swelling.  Neurological:     Gait: Gait abnormal.     Medications and allergies   Allergies  Allergen Reactions   Sulfamethoxazole-Trimethoprim Nausea And Vomiting      Medication list after today's encounter   Current  Outpatient Medications:    ALPRAZolam (XANAX) 0.25 MG tablet, 1 tablet (0.25 mg total) by Per NG tube route daily as needed for anxiety., Disp: 90 tablet, Rfl: 0   Ascorbic Acid (VITAMIN C) 1000 MG tablet, Take 500 mg by mouth daily., Disp: , Rfl:    aspirin 81 MG chewable tablet, Chew 1 tablet by mouth daily., Disp: , Rfl:    DULoxetine (CYMBALTA) 30 MG capsule, Take 30 mg by mouth daily., Disp: , Rfl:    flecainide (TAMBOCOR) 100 MG tablet, Take 1 tablet every 12 hours by oral route., Disp: , Rfl:    losartan-hydrochlorothiazide (HYZAAR) 50-12.5 MG tablet, Take 1 tablet by mouth every morning., Disp: 90 tablet, Rfl: 3   meclizine (ANTIVERT) 12.5 MG tablet, as needed. As needed., Disp: , Rfl:    Melatonin 5 MG CHEW, Chew 5 mg by mouth at bedtime as needed., Disp: , Rfl:    meloxicam (MOBIC) 15 MG tablet, As needed., Disp: , Rfl:    MULTIPLE VITAMIN PO, Take 1 tablet by mouth daily., Disp: , Rfl:    rosuvastatin (CRESTOR) 20 MG tablet, Take 0.5 tablets (10 mg total) by mouth at bedtime., Disp: 90 tablet, Rfl: 3   sertraline (ZOLOFT) 50 MG tablet, Take 1 tablet (50 mg total) by mouth daily. Use to taper off sertraline slowly over several months, if tolerated. Be aware anxiety and irritability may increase and you might have to stay on this, Disp: 90 tablet, Rfl: 3   Laboratory examination:      Latest Ref Rng & Units 03/09/2022   10:50 AM 11/22/2021    9:56 AM 12/10/2020    9:18 AM  CMP  Glucose 70 - 99 mg/dL  99  72   BUN 6 - 23 mg/dL  23  21   Creatinine 9.32 - 1.50 mg/dL  3.55  7.32   Sodium 202 - 145 mEq/L  136  137   Potassium 3.5 - 5.1 mEq/L  4.4  4.0   Chloride 96 - 112 mEq/L  103  101   CO2 19 - 32 mEq/L  26  26   Calcium 8.4 - 10.5 mg/dL  9.3  9.6   Total Protein 6.0 - 8.5 g/dL 7.1  6.9    Total Bilirubin 0.2 - 1.2 mg/dL  0.7    Alkaline Phos 39 - 117 U/L  82    AST 0 - 37 U/L  18    ALT 0 - 53 U/L  15  Latest Ref Rng & Units 11/22/2021    9:56 AM 12/10/2020    9:18  AM 09/16/2020    1:55 AM  CBC  WBC 4.0 - 10.5 K/uL 6.3  7.4  8.1   Hemoglobin 13.0 - 17.0 g/dL 14.5  16.0  8.3   Hematocrit 39.0 - 52.0 % 42.8  46.6  24.3   Platelets 150.0 - 400.0 K/uL 254.0  265  210    Lipid Panel     Component Value Date/Time   CHOL 158 07/15/2021 0834   TRIG 72 07/15/2021 0834   HDL 59 07/15/2021 0834   CHOLHDL 2.2 11/30/2011 0702   VLDL 17 11/30/2011 0702   LDLCALC 85 07/15/2021 0834   HEMOGLOBIN A1C Lab Results  Component Value Date   HGBA1C 5.5 03/09/2022   MPG 117 (H) 11/30/2011   No results found for: "TSH"   External labs:   Labs 02/23/2021: Potassium 4.8, BUN 23, creatinine 1.15, EGFR 64 mL.   Total cholesterol 155, triglycerides 69, HDL 62, LDL 81.  TSH normal.  A1c 5.1%.  Hb 14.3/HCT 42.2, platelets 227.  Radiology:   No results found.  Cardiac Studies:   Echocardiogram 11/30/2011: Normal LV systolic function, EF 60 to 65%.  No significant valvular abnormality.  Normal left atrial size.  Carotid artery duplex 06/15/2021: Minimal stenosis in the right carotid artery of 1 to 39%, left carotid endarterectomy site widely patent.  Antegrade vertebral artery flow.  Normal flow dynamics in bilateral subclavian arteries.  Loop - Medtronic Linq 10/22/2020   Remote loop recorder transmission.  03/31/2022: Predominant rhythm is normal sinus rhythm.  Approximately 7 hours of A-fib with RVR on 03/09/2022.  Atrial fibrillation burden <1%.  No symptoms reported, no bradycardia.  In view of gait instability, frequent fall, not a candidate for anticoagulation, will continue to monitor for sustained atrial fibrillation.  EKG:   *** EKG 01/04/2022: SSinus rhythm with first-degree block 63 bpm, normal axis, no evidence of ischemia, normal EKG. no significant change from 07/19/2021.   Assessment     ICD-10-CM   1. AF (paroxysmal atrial fibrillation) (HCC)  I48.0     2. Primary hypertension  I10     3. Pure hypercholesterolemia  E78.00     4.  Loop - Medtronic Linq 10/22/2020  Z98.890        No orders of the defined types were placed in this encounter.  Recommendations:   DU TEPLY is a 82 y.o. Caucasian male patient with hypertension, hyperlipidemia, remote stroke with left carotid endarterectomy and follows Dr. Gae Gallop, OSA on CPAP, compliant, remote tobacco use disorder, history of gait instability and frequent fall related to burnt out spinal stenosis and peripheral neuropathy, paroxysmal atrial fibrillation, not on anticoagulation in view of spontaneous muscle hematoma and frequent falls.  He also underwent loop recorder implantation for evaluation of bradycardia and frequent fall on 10/22/2020.     His main issue continues to be weakness in his lower extremities and muscle weakness.  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  Previously on his last office visit 6 months ago he had discontinued Crestor for 6 weeks but restarted it back as there was no change in his symptoms.  This time he would like to try it for 3 months which I have agreed.  I would like to see him back at that time to make sure that he is on some form of lipid-lowering therapy.  In view of carotid artery stenosis  and prior stroke which was thrombotic stroke.  With regard to atrial fibrillation, he has not had any further sustained episodes of atrial fibrillation and he is presently has a loop recorder, the longest he has had is about 2 to 3 hours.  As there is a clear-cut contraindication for anticoagulation, if he has sustained episodes of atrial fibrillation then we can consider Watchman device with brief and temporary use of Eliquis.  Blood pressures well controlled.  Lipids although not at goal, in view of myopathy, I have not increased the dose of Crestor or adding Zetia to his because of patient concerns as well.  Office visit in 3 months and if he remains stable on a 6 monthly basis.  He is being closely followed by  neurology as well for his leg weakness.      Adrian Prows, MD, [facility] 04/05/2022, 11:45 AM Office: [phone] Fax: [phone] Pager: [phone]

## 2022-04-05 NOTE — Therapy (Signed)
OUTPATIENT PHYSICAL THERAPY THORACOLUMBAR TREATMENT   Patient Name: Brandon Mendoza MRN: [id number] DOB:03-13-1941, 82 y.o., male Today's Date: 04/05/2022  END OF SESSION:  PT End of Session - 04/05/22 0924     Visit Number 10    Date for PT Re-Evaluation 05/18/22    PT Start Time 0930    PT Stop Time 1015    PT Time Calculation (min) 45 min    Activity Tolerance Patient tolerated treatment well    Behavior During Therapy [facility] for tasks assessed/performed             Past Medical History:  Diagnosis Date   Acquired clawfoot, right foot 12/16/2021   Anxiety    Atrophy of calf muscles 10/21/2021   Duplex calves was negative for significant blockage [facility]G [facility] done at [facility] MRI lumbar spine was also done at [facility]   Carotid artery occlusion    COPD (chronic obstructive pulmonary disease) (HCC)    Depression    Dysrhythmia    PAF, atrial tachycardia   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   The spinal cord appears normal. 2.   No spinal stenosis. 3.   Mild multilevel degenerative changes as detailed above that do not lead to spinal stenosis or nerve root compression. 4.   T2 hyperintense foci within the pons consistent with chronic microvascular ischemic changes.   Loop - Medtronic Linq 10/22/2020 10/22/2020   Nocturia    Peripheral vascular disease (HCC)    Sleep apnea    on 6 cm, nasal pillow   Stroke (HCC) 05/01/2011   ischemic   Weakness generalized 10/21/2021   Severe, legs and arms   Past Surgical History:  Procedure Laterality Date   CAROTID ENDARTERECTOMY  12/09/11   Left cea   ENDARTERECTOMY  12/09/2011   Procedure: ENDARTERECTOMY CAROTID;  Surgeon: Larina Earthly, MD;  Location: [facility] OR;  Service: Vascular;  Laterality: Left;   EYE SURGERY      rt retina detachment   HAMMER TOE SURGERY  2004   HERNIA REPAIR  2006   SHOULDER ARTHROSCOPY W/ ROTATOR CUFF REPAIR  2010   TONSILLECTOMY     Patient Active Problem List   Diagnosis Date Noted   Macular degeneration 03/01/2022   Myofascial pain 02/10/2022   Lumbar spondylosis 01/03/2022   Acquired clawfoot, left foot 12/16/2021   Acquired clawfoot, right foot 12/16/2021   Claw foot 12/14/2021   Gait abnormality 12/10/2021   Spinal stenosis at L4-L5 level 12/10/2021   Ataxia 11/22/2021   Lightheadedness 11/02/2021   Claudication of lower extremity (HCC) 10/21/2021   Atrophy of calf muscles 10/21/2021   Weakness generalized 10/21/2021   Focal neurological deficit 10/21/2021   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   Pain in joint of right hip 09/14/2021   High arches 07/22/2021   Hammertoes of both feet 07/22/2021   Neuropathy 07/22/2021   Constipation 05/28/2021   Disequilibrium 05/28/2021   Personal history of colonic polyps 05/28/2021   Renal cyst 05/28/2021   Gastroesophageal reflux disease without esophagitis 05/05/2021   Ingrowing left great toenail 02/15/2021   S/P lumbar fusion 12/24/2020   Spinal stenosis of lumbar region 12/04/2020   Acquired  complex renal cyst 11/23/2020   Functional gait abnormality 11/04/2020   Idiopathic neuropathy 11/02/2020   Tinea pedis of both feet 10/28/2020   Loop - Medtronic Linq 10/22/2020 10/22/2020   Benign hypertension with CKD (chronic kidney disease) stage III ([place]) 10/05/2020   Fall with injury 10/05/2020   Hematoma 09/16/2020   Normocytic anemia 09/14/2020   AF (paroxysmal atrial fibrillation) ([place]) 09/14/2020   Chronic anticoagulation 09/14/2020   Hyponatremia 09/14/2020   Pain in left foot 01/22/2020   Acquired thrombophilia ([place]) 12/30/2019   Pain in joint of right knee 12/27/2019   Recurrent falls 11/17/2019   Paroxysmal atrial fibrillation ([place]) 10/22/2019   Pain in right foot  10/14/2019   Falls 10/07/2019   Ataxia involving legs 08/12/2019   OSA on CPAP 06/20/2018   Urge incontinence 04/13/2018   Urinary urgency 04/13/2018   Pain in left knee 03/01/2018   Other intervertebral disc degeneration, lumbar region 06/16/2017   Acquired bilateral hammer toes 10/05/2015   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   Generalized anxiety disorder 05/12/2015   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[place]) 05/10/2015   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[place]) 04/10/2014   History of left-sided carotid endarterectomy 04/10/2014   Essential hypertension 04/10/2014   Occlusion and stenosis of carotid artery without mention of cerebral infarction 12/15/2011   Habitual alcohol use 12/01/2011   Carotid artery stenosis, symptomatic 11/30/2011   Cerebral infarction ([place]) 11/29/2011   Hyperlipidemia     PCP: Berniece Pap  REFERRING PROVIDER: Berniece Pap  REFERRING DIAG: M54.42,M54.41,G89.29   Rationale for Evaluation and Treatment: Rehabilitation  THERAPY DIAG:  Other low back pain  Other abnormalities of gait and mobility  Contracture of muscle, multiple sites  Repeated falls  ONSET DATE: 01/25/22  SUBJECTIVE:                                                                                                                                                                                           SUBJECTIVE STATEMENT: Doing good, No falls recently   PERTINENT HISTORY:  Cerebral infarct 10/21/21, lumbar fusion, recurrent falls   PAIN:  Are you having pain? Yes: NPRS scale: 0/10 Pain location: low back Pain description: ache  Aggravating factors: being on my feet  Relieving factors: movement  PRECAUTIONS: Fall  WEIGHT BEARING RESTRICTIONS: No  FALLS:  Has patient fallen in last 6 months? Yes. Number of falls every few days   LIVING ENVIRONMENT: Lives with: lives with their spouse Lives in: House/apartment Stairs: Yes:  Internal: 20 steps; on right going up Has following equipment at home: Gilford Rile - 4 wheeled and trekking pole  OCCUPATION: Retired  PLOF: Independent  PATIENT GOALS: to not fall and go deer hunting again  NEXT MD VISIT:   OBJECTIVE:   DIAGNOSTIC FINDINGS:    LUMBAR SPINE FINDINGS: 1. Wide laminectomy and posterior pedicle screw and rod fixation at L4-5 without residual or recurrent stenosis. 2. Progressive adjacent level disease at L3-4 with moderate right and mild left subarticular narrowing and moderate foraminal stenosis bilaterally. Progression is predominantly due to facet disease 3. Progressive moderate facet hypertrophy at L1-2 with a progressive rightward disc protrusion resulting in progressive moderate right foraminal stenosis. Asymmetric endplate marrow edema on the right at L1-2 is consistent with progressive disease as well. 4. Otherwise stable degenerative change without other focal stenosis.   THORACIC SPINE FINDINGS:  On sagittal views the vertebral bodies have normal height and alignment.  Hemangioma within T9 vertebral body and slightly within T10 vertebral body.  Anterior kyphosis.  Mild scoliosis convex right centered at T8 level. The spinal cord is normal in size and appearance. The paraspinal soft tissues are unremarkable.     On axial views there is no spinal stenosis or foraminal narrowing.  Limited views of the aorta, kidneys, liver, lungs and paraspinal muscles are notable for multiple large right renal cysts ranging in size from 1.5 to 7 cm.   COGNITION: Overall cognitive status: Within functional limits for tasks assessed     SENSATION: WFL  MUSCLE LENGTH: Hamstrings: mod tightness on both sides  POSTURE: rounded shoulders and forward head  LUMBAR ROM:   AROM eval  Flexion Limited due to HS tightness  Extension WFL  Right lateral flexion Tightness in R side  Left lateral flexion WFL  Right rotation WFL  Left rotation Tightness on R side    (Blank rows = not tested)  LOWER EXTREMITY ROM:   grossly WFL    LOWER EXTREMITY MMT:  grossly 4+/5   FUNCTIONAL TESTS:  5 times sit to stand: 20s Timed up and go (TUG): 16s  GAIT: Distance walked: in clinic distances Assistive device utilized:  trekking pole Level of assistance: SBA Comments: ataxic gait pattern, increased lateral sway, decreased safety awareness   TODAY'S TREATMENT:                                                                                                                              DATE:  04/05/21 Bike L3.5 x 6 min Hamstring curls 45lb 3x12 Leg Ext 15lb 3x12 S2S 2x10 some on airex  Side step in // bars   03/29/21 Bike L3 x 6 min Shoulder Ext 10lb 2x10 Alt 6in box taps x10 each Lateral 4 in step ups S2S on Airex pad 2x10 On airex w/ ball toss Side step in // bars on airex Tandem walking in // bars  Hamstring curls 45lb 3x12 Leg Ext 15lb 3x12   03/25/22 NuStep L5 x6 min  Leg press 80lb 2x12  Seated Rows and lats 35lb 3x10 Chest press 35lb 2x10, 25lb x10  S2S LE on airex 2x10 Shoulder Ext 10lb 2x10 Calf stretch     03/23/22 NuStep L5 x 6 min Alt box taps 4in x10, 6in x10 with trekking pole  Resisted gait 20lb all directions x 4 S2S OHP yellow 2x10 Seated HS curls 45lb 3x12 Leg Ext 20lb 3x12 Leg Press 80lb 2x10, Heel raises 80lb 2x12   03/16/22 Bike L4 x 6 min Side step over WaTE bar frequent LOB min assist to recover Side step and tandem walking in // bars, side step on and off airex in // bars Seated HS curls 45lb 3x12 Leg Ext 20lb 3x12 Seated Rows & Lats 45lb 2x12 Chest press 25lb 2x10 Leg press 60lb 3x12   PATIENT EDUCATION:  Education details: HEP and POC Person educated: Patient Education method: Explanation Education comprehension: verbalized understanding  HOME EXERCISE PROGRAM: Marching, 3 way leg kicks, mini squats, heel raises, STS from chair   ASSESSMENT:  CLINICAL IMPRESSION: Patient is a 83 y.o. male  who was seen today for physical therapy treatment. He enters feeling well. Continues to have balance issues overall.  He has progressed meeting his BERG goal and decreasing his TUG time. He had the most difficulty with alt box taps and tandem stance. . Very difficult time with side steps. Good strength with machine level interventions.    OBJECTIVE IMPAIRMENTS: Abnormal gait, decreased balance, decreased coordination, and difficulty walking.   ACTIVITY LIMITATIONS: squatting, stairs, transfers, and locomotion level  REHAB POTENTIAL: Fair dependent on carry over and patient compliance  CLINICAL DECISION MAKING: Stable/uncomplicated  EVALUATION COMPLEXITY: Low  GOALS: Goals reviewed with patient? Yes  SHORT TERM GOALS: Target date: 04/06/22  Patient will be independent with initial HEP. Goal status: Met 03/07/22  2.  Patient will be educated on strategies to decrease risk of falls.  Goal status: 03/07/22   LONG TERM GOALS: Target date: 05/18/22  Patient will be independent with advanced/ongoing HEP to improve outcomes and carryover.  Goal status: Met  2.  Patient will demonstrate decreased fall risk by scoring < 12 sec on TUG. Baseline: 16s Goal status: Progressing 04/05/22 - 12.25 sec  3.  Patient will demonstrate improved functional LE strength as demonstrated by < 15s on 5xSTS. Baseline: 20s Goal status: Met 11.09 03/25/22  4.  Patient will score 42 on Berg Balance test to demonstrate lower risk of falls. (MCID= 8 points) .  Baseline: 36/56 Goal status: Met 42/56 04/05/22  5.  Patient will report no falls in a 4 weeks period. Baseline: falls every few days Goal status: On going  PLAN:  PT FREQUENCY: 2x/week  PT DURATION: 12 weeks  PLANNED INTERVENTIONS: Therapeutic exercises, Therapeutic activity, Neuromuscular re-education, Balance training, Gait training, Patient/Family education, Self Care, Joint mobilization, Stair training, Cryotherapy, Moist heat, and Manual  therapy.  PLAN FOR NEXT SESSION: balance and LE strengthening   Grayce Sessions, PTA 04/05/2022, 9:27 AM

## 2022-04-06 ENCOUNTER — Ambulatory Visit: Payer: Medicare Other | Admitting: Cardiology

## 2022-04-06 ENCOUNTER — Encounter: Payer: Self-pay | Admitting: Cardiology

## 2022-04-06 VITALS — BP 138/76 | HR 62 | Resp 15 | Ht 69.0 in | Wt 189.8 lb

## 2022-04-06 DIAGNOSIS — I480 Paroxysmal atrial fibrillation: Secondary | ICD-10-CM

## 2022-04-06 DIAGNOSIS — E7800 Pure hypercholesterolemia, unspecified: Secondary | ICD-10-CM

## 2022-04-06 DIAGNOSIS — Z9889 Other specified postprocedural states: Secondary | ICD-10-CM

## 2022-04-06 DIAGNOSIS — I10 Essential (primary) hypertension: Secondary | ICD-10-CM

## 2022-04-07 ENCOUNTER — Encounter: Payer: Self-pay | Admitting: Physical Therapy

## 2022-04-07 ENCOUNTER — Ambulatory Visit: Payer: Medicare Other | Admitting: Physical Therapy

## 2022-04-07 DIAGNOSIS — M5459 Other low back pain: Secondary | ICD-10-CM

## 2022-04-07 DIAGNOSIS — R296 Repeated falls: Secondary | ICD-10-CM

## 2022-04-07 DIAGNOSIS — R2689 Other abnormalities of gait and mobility: Secondary | ICD-10-CM

## 2022-04-07 NOTE — Therapy (Signed)
OUTPATIENT PHYSICAL THERAPY THORACOLUMBAR TREATMENT   Patient Name: Brandon Mendoza MRN: [id number] DOB:1940/07/04, 82 y.o., male Today's Date: 04/07/2022  END OF SESSION:  PT End of Session - 04/07/22 0927     Visit Number 11    Date for PT Re-Evaluation 05/18/22    PT Start Time 0929    PT Stop Time 1015    PT Time Calculation (min) 46 min    Activity Tolerance Patient tolerated treatment well    Behavior During Therapy [facility] for tasks assessed/performed             Past Medical History:  Diagnosis Date   Acquired clawfoot, right foot 12/16/2021   Anxiety    Atrophy of calf muscles 10/21/2021   Duplex calves was negative for significant blockage [facility] was done at [facility] MRI lumbar spine was [facility] done at [facility]   Carotid artery occlusion    COPD (chronic obstructive pulmonary disease) (HCC)    Depression    Dysrhythmia    PAF, atrial tachycardia   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   The spinal cord appears normal. 2.   No spinal stenosis. 3.   Mild multilevel degenerative changes as detailed above that do not lead to spinal stenosis or nerve root compression. 4.   T2 hyperintense foci within the pons consistent with chronic microvascular ischemic changes.   Loop - Medtronic Linq 10/22/2020 10/22/2020   Nocturia    Peripheral vascular disease (HCC)    Sleep apnea    on 6 cm, nasal pillow   Stroke (HCC) 05/01/2011   ischemic   Weakness generalized 10/21/2021   Severe, legs and arms   Past Surgical History:  Procedure Laterality Date   CAROTID ENDARTERECTOMY  12/09/11   Left cea   ENDARTERECTOMY  12/09/2011   Procedure: ENDARTERECTOMY CAROTID;  Surgeon: Larina Earthly, MD;  Location: [facility] OR;  Service: Vascular;  Laterality: Left;   EYE SURGERY      rt retina detachment   HAMMER TOE SURGERY  2004   HERNIA REPAIR  2006   SHOULDER ARTHROSCOPY W/ ROTATOR CUFF REPAIR  2010   TONSILLECTOMY     Patient Active Problem List   Diagnosis Date Noted   Macular degeneration 03/01/2022   Myofascial pain 02/10/2022   Lumbar spondylosis 01/03/2022   Acquired clawfoot, left foot 12/16/2021   Acquired clawfoot, right foot 12/16/2021   Claw foot 12/14/2021   Gait abnormality 12/10/2021   Spinal stenosis at L4-L5 level 12/10/2021   Ataxia 11/22/2021   Lightheadedness 11/02/2021   Claudication of lower extremity (HCC) 10/21/2021   Atrophy of calf muscles 10/21/2021   Weakness generalized 10/21/2021   Focal neurological deficit 10/21/2021   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   Pain in joint of right hip 09/14/2021   High arches 07/22/2021   Hammertoes of both feet 07/22/2021   Neuropathy 07/22/2021   Constipation 05/28/2021   Disequilibrium 05/28/2021   Personal history of colonic polyps 05/28/2021   Renal cyst 05/28/2021   Gastroesophageal reflux disease without esophagitis 05/05/2021   Ingrowing left great toenail 02/15/2021   S/P lumbar fusion 12/24/2020   Spinal stenosis of lumbar region 12/04/2020   Acquired  complex renal cyst 11/23/2020   Functional gait abnormality 11/04/2020   Idiopathic neuropathy 11/02/2020   Tinea pedis of both feet 10/28/2020   Loop - Medtronic Linq 10/22/2020 10/22/2020   Benign hypertension with CKD (chronic kidney disease) stage III ([place]) 10/05/2020   Fall with injury 10/05/2020   Hematoma 09/16/2020   Normocytic anemia 09/14/2020   AF (paroxysmal atrial fibrillation) ([place]) 09/14/2020   Chronic anticoagulation 09/14/2020   Hyponatremia 09/14/2020   Pain in left foot 01/22/2020   Acquired thrombophilia ([place]) 12/30/2019   Pain in joint of right knee 12/27/2019   Recurrent falls 11/17/2019   Paroxysmal atrial fibrillation ([place]) 10/22/2019   Pain in right foot  10/14/2019   Falls 10/07/2019   Ataxia involving legs 08/12/2019   OSA on CPAP 06/20/2018   Urge incontinence 04/13/2018   Urinary urgency 04/13/2018   Pain in left knee 03/01/2018   Other intervertebral disc degeneration, lumbar region 06/16/2017   Acquired bilateral hammer toes 10/05/2015   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   Generalized anxiety disorder 05/12/2015   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[place]) 05/10/2015   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[place]) 04/10/2014   History of left-sided carotid endarterectomy 04/10/2014   Essential hypertension 04/10/2014   Occlusion and stenosis of carotid artery without mention of cerebral infarction 12/15/2011   Habitual alcohol use 12/01/2011   Carotid artery stenosis, symptomatic 11/30/2011   Cerebral infarction ([place]) 11/29/2011   Hyperlipidemia     PCP: Berniece Pap  REFERRING PROVIDER: Berniece Pap  REFERRING DIAG: M54.42,M54.41,G89.29   Rationale for Evaluation and Treatment: Rehabilitation  THERAPY DIAG:  Other low back pain  Repeated falls  Other abnormalities of gait and mobility  ONSET DATE: 01/25/22  SUBJECTIVE:                                                                                                                                                                                           SUBJECTIVE STATEMENT: "Ok"  PERTINENT HISTORY:  Cerebral infarct 10/21/21, lumbar fusion, recurrent falls   PAIN:  Are you having pain? Yes: NPRS scale: 0/10 Pain location: low back Pain description: ache  Aggravating factors: being on my feet  Relieving factors: movement  PRECAUTIONS: Fall  WEIGHT BEARING RESTRICTIONS: No  FALLS:  Has patient fallen in last 6 months? Yes. Number of falls every few days   LIVING ENVIRONMENT: Lives with: lives with their spouse Lives in: House/apartment Stairs: Yes: Internal: 20 steps; on right going up Has following equipment at  home: Gilford Rile - 4 wheeled and trekking pole  OCCUPATION: Retired  PLOF: Independent  PATIENT GOALS: to not fall  and go deer hunting again  NEXT MD VISIT:   OBJECTIVE:   DIAGNOSTIC FINDINGS:    LUMBAR SPINE FINDINGS: 1. Wide laminectomy and posterior pedicle screw and rod fixation at L4-5 without residual or recurrent stenosis. 2. Progressive adjacent level disease at L3-4 with moderate right and mild left subarticular narrowing and moderate foraminal stenosis bilaterally. Progression is predominantly due to facet disease 3. Progressive moderate facet hypertrophy at L1-2 with a progressive rightward disc protrusion resulting in progressive moderate right foraminal stenosis. Asymmetric endplate marrow edema on the right at L1-2 is consistent with progressive disease as well. 4. Otherwise stable degenerative change without other focal stenosis.   THORACIC SPINE FINDINGS:  On sagittal views the vertebral bodies have normal height and alignment.  Hemangioma within T9 vertebral body and slightly within T10 vertebral body.  Anterior kyphosis.  Mild scoliosis convex right centered at T8 level. The spinal cord is normal in size and appearance. The paraspinal soft tissues are unremarkable.     On axial views there is no spinal stenosis or foraminal narrowing.  Limited views of the aorta, kidneys, liver, lungs and paraspinal muscles are notable for multiple large right renal cysts ranging in size from 1.5 to 7 cm.   COGNITION: Overall cognitive status: Within functional limits for tasks assessed     SENSATION: WFL  MUSCLE LENGTH: Hamstrings: mod tightness on both sides  POSTURE: rounded shoulders and forward head  LUMBAR ROM:   AROM eval  Flexion Limited due to HS tightness  Extension WFL  Right lateral flexion Tightness in R side  Left lateral flexion WFL  Right rotation WFL  Left rotation Tightness on R side   (Blank rows = not tested)  LOWER EXTREMITY ROM:   grossly WFL     LOWER EXTREMITY MMT:  grossly 4+/5   FUNCTIONAL TESTS:  5 times sit to stand: 20s Timed up and go (TUG): 16s  GAIT: Distance walked: in clinic distances Assistive device utilized:  trekking pole Level of assistance: SBA Comments: ataxic gait pattern, increased lateral sway, decreased safety awareness   TODAY'S TREATMENT:                                                                                                                              DATE:  04/07/22 Bike L3.5 x 6 min Alt 4 in box taps w/ Trekking pole 2x10 S2S LE on airex 2x10 elevated mat  Step ups on airex with trekking pole 2x5 each Leg press 70lb 2x12, Heel raises 40lb 3x15  Seated Rows and lats 35lb 3x12 Chest press 20lb 2x12 Shoulder Ext 10lb 2x10   04/05/21 Bike L3.5 x 6 min Hamstring curls 45lb 3x12 Leg Ext 15lb 3x12 S2S 2x10 some on airex  Side step in // bars   03/29/21 Bike L3 x 6 min Shoulder Ext 10lb 2x10 Alt 6in box taps x10 each Lateral 4 in step ups S2S on Airex pad 2x10 On airex w/ ball toss  Side step in // bars on airex Tandem walking in // bars  Hamstring curls 45lb 3x12 Leg Ext 15lb 3x12   03/25/22 NuStep L5 x6 min  Leg press 80lb 2x12  Seated Rows and lats 35lb 3x10 Chest press 35lb 2x10, 25lb x10 S2S LE on airex 2x10 Shoulder Ext 10lb 2x10 Calf stretch     03/23/22 NuStep L5 x 6 min Alt box taps 4in x10, 6in x10 with trekking pole  Resisted gait 20lb all directions x 4 S2S OHP yellow 2x10 Seated HS curls 45lb 3x12 Leg Ext 20lb 3x12 Leg Press 80lb 2x10, Heel raises 80lb 2x12   03/16/22 Bike L4 x 6 min Side step over WaTE bar frequent LOB min assist to recover Side step and tandem walking in // bars, side step on and off airex in // bars Seated HS curls 45lb 3x12 Leg Ext 20lb 3x12 Seated Rows & Lats 45lb 2x12 Chest press 25lb 2x10 Leg press 60lb 3x12   PATIENT EDUCATION:  Education details: HEP and POC Person educated: Patient Education method:  Explanation Education comprehension: verbalized understanding  HOME EXERCISE PROGRAM: Marching, 3 way leg kicks, mini squats, heel raises, STS from chair   ASSESSMENT:  CLINICAL IMPRESSION: Patient is a 82 y.o. male who was seen today for physical therapy treatment. He enters feeling well. Continues to have balance issues overall.  He had the most difficulty with alt box taps and interventions with non compliant surfaces. Good strength with machine level interventions.    OBJECTIVE IMPAIRMENTS: Abnormal gait, decreased balance, decreased coordination, and difficulty walking.   ACTIVITY LIMITATIONS: squatting, stairs, transfers, and locomotion level  REHAB POTENTIAL: Fair dependent on carry over and patient compliance  CLINICAL DECISION MAKING: Stable/uncomplicated  EVALUATION COMPLEXITY: Low  GOALS: Goals reviewed with patient? Yes  SHORT TERM GOALS: Target date: 04/06/22  Patient will be independent with initial HEP. Goal status: Met 03/07/22  2.  Patient will be educated on strategies to decrease risk of falls.  Goal status: 03/07/22   LONG TERM GOALS: Target date: 05/18/22  Patient will be independent with advanced/ongoing HEP to improve outcomes and carryover.  Goal status: Met  2.  Patient will demonstrate decreased fall risk by scoring < 12 sec on TUG. Baseline: 16s Goal status: Progressing 04/05/22 - 12.25 sec  3.  Patient will demonstrate improved functional LE strength as demonstrated by < 15s on 5xSTS. Baseline: 20s Goal status: Met 11.09 03/25/22  4.  Patient will score 42 on Berg Balance test to demonstrate lower risk of falls. (MCID= 8 points) .  Baseline: 36/56 Goal status: Met 42/56 04/05/22  5.  Patient will report no falls in a 4 weeks period. Baseline: falls every few days Goal status: On going  PLAN:  PT FREQUENCY: 2x/week  PT DURATION: 12 weeks  PLANNED INTERVENTIONS: Therapeutic exercises, Therapeutic activity, Neuromuscular re-education,  Balance training, Gait training, Patient/Family education, Self Care, Joint mobilization, Stair training, Cryotherapy, Moist heat, and Manual therapy.  PLAN FOR NEXT SESSION: balance and LE strengthening   Scot Jun, PTA 04/07/2022, 9:28 AM

## 2022-04-12 ENCOUNTER — Telehealth: Payer: Self-pay

## 2022-04-12 ENCOUNTER — Other Ambulatory Visit: Payer: Self-pay

## 2022-04-12 ENCOUNTER — Encounter: Payer: Self-pay | Admitting: Physical Therapy

## 2022-04-12 ENCOUNTER — Ambulatory Visit: Payer: Medicare Other | Admitting: Physical Therapy

## 2022-04-12 DIAGNOSIS — R296 Repeated falls: Secondary | ICD-10-CM | POA: Diagnosis not present

## 2022-04-12 DIAGNOSIS — M5459 Other low back pain: Secondary | ICD-10-CM

## 2022-04-12 DIAGNOSIS — M6249 Contracture of muscle, multiple sites: Secondary | ICD-10-CM

## 2022-04-12 DIAGNOSIS — R2689 Other abnormalities of gait and mobility: Secondary | ICD-10-CM

## 2022-04-12 DIAGNOSIS — I480 Paroxysmal atrial fibrillation: Secondary | ICD-10-CM

## 2022-04-12 MED ORDER — FLECAINIDE ACETATE 100 MG PO TABS: 100.0000 mg | ORAL_TABLET | Freq: Two times a day (BID) | ORAL | 1 refills | Status: DC

## 2022-04-12 NOTE — Telephone Encounter (Signed)
Patient called requesting a refill on FLECAINIDE. He has a new insurance and the pharmacy "[facility]" is asking for a new prescription.

## 2022-04-12 NOTE — Therapy (Signed)
OUTPATIENT PHYSICAL THERAPY THORACOLUMBAR TREATMENT   Patient Name: Brandon Mendoza MRN: [id number] DOB:1940-12-14, 82 y.o., male Today's Date: 04/12/2022  END OF SESSION:  PT End of Session - 04/12/22 0757     Visit Number 12    Date for PT Re-Evaluation 05/18/22    Authorization Type [employer]    PT Start Time 0756    PT Stop Time 0841    PT Time Calculation (min) 45 min    Activity Tolerance Patient tolerated treatment well    Behavior During Therapy [facility] for tasks assessed/performed             Past Medical History:  Diagnosis Date   Acquired clawfoot, right foot 12/16/2021   Anxiety    Atrophy of calf muscles 10/21/2021   Duplex calves was negative for significant blockage EMG was done at [facility] MRI lumbar spine was also done at [facility]   Carotid artery occlusion    COPD (chronic obstructive pulmonary disease) (HCC)    Depression    Dysrhythmia    PAF, atrial tachycardia   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   The spinal cord appears normal. 2.   No spinal stenosis. 3.   Mild multilevel degenerative changes as detailed above that do not lead to spinal stenosis or nerve root compression. 4.   T2 hyperintense foci within the pons consistent with chronic microvascular ischemic changes.   Loop - Medtronic Linq 10/22/2020 10/22/2020   Nocturia    Peripheral vascular disease (HCC)    Sleep apnea    on 6 cm, nasal pillow   Stroke ([place]) 05/01/2011   ischemic   Weakness generalized 10/21/2021   Severe, legs and arms   Past Surgical History:  Procedure Laterality Date   CAROTID ENDARTERECTOMY  12/09/11   Left cea   ENDARTERECTOMY  12/09/2011   Procedure: ENDARTERECTOMY CAROTID;  Surgeon: Rosetta Posner, MD;  Location: [place];  Service: Vascular;   Laterality: Left;   EYE SURGERY     rt retina detachment   HAMMER TOE SURGERY  2004   HERNIA REPAIR  2006   SHOULDER ARTHROSCOPY W/ ROTATOR CUFF REPAIR  2010   TONSILLECTOMY     Patient Active Problem List   Diagnosis Date Noted   Macular degeneration 03/01/2022   Myofascial pain 02/10/2022   Lumbar spondylosis 01/03/2022   Acquired clawfoot, left foot 12/16/2021   Acquired clawfoot, right foot 12/16/2021   Claw foot 12/14/2021   Gait abnormality 12/10/2021   Spinal stenosis at L4-L5 level 12/10/2021   Ataxia 11/22/2021   Lightheadedness 11/02/2021   Claudication of lower extremity ([place]) 10/21/2021   Atrophy of calf muscles 10/21/2021   Weakness generalized 10/21/2021   Focal neurological deficit 10/21/2021   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   Pain in joint of right hip 09/14/2021   High arches 07/22/2021   Hammertoes of both feet 07/22/2021   Neuropathy 07/22/2021   Constipation 05/28/2021   Disequilibrium 05/28/2021   Personal history of colonic polyps 05/28/2021   Renal cyst 05/28/2021   Gastroesophageal reflux disease without esophagitis 05/05/2021   Ingrowing left great toenail 02/15/2021   S/P lumbar fusion 12/24/2020   Spinal stenosis of  lumbar region 12/04/2020   Acquired complex renal cyst 11/23/2020   Functional gait abnormality 11/04/2020   Idiopathic neuropathy 11/02/2020   Tinea pedis of both feet 10/28/2020   Loop - Medtronic Linq 10/22/2020 10/22/2020   Benign hypertension with 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) 10/05/2020   Fall with injury 10/05/2020   Hematoma 09/16/2020   Normocytic anemia 09/14/2020   AF (paroxysmal atrial fibrillation) (HCC) 09/14/2020   Chronic anticoagulation 09/14/2020   Hyponatremia 09/14/2020   Pain in left foot 01/22/2020   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   Pain in joint of right knee 12/27/2019   Recurrent falls 11/17/2019   Paroxysmal atrial fibrillation (HCC)  10/22/2019   Pain in right foot 10/14/2019   Falls 10/07/2019   Ataxia involving legs 08/12/2019   OSA on CPAP 06/20/2018   Urge incontinence 04/13/2018   Urinary urgency 04/13/2018   Pain in left knee 03/01/2018   Other intervertebral disc degeneration, lumbar region 06/16/2017   Acquired bilateral hammer toes 10/05/2015   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   Generalized anxiety disorder 05/12/2015   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   History of left-sided carotid endarterectomy 04/10/2014   Essential hypertension 04/10/2014   Occlusion and stenosis of carotid artery without mention of cerebral infarction 12/15/2011   Habitual alcohol use 12/01/2011   Carotid artery stenosis, symptomatic 11/30/2011   Cerebral infarction (HCC) 11/29/2011   Hyperlipidemia     PCP: Glenetta Hew  REFERRING PROVIDER: Glenetta Hew  REFERRING DIAG: M54.42,M54.41,G89.29   Rationale for Evaluation and Treatment: Rehabilitation  THERAPY DIAG:  Other low back pain  Repeated falls  Other abnormalities of gait and mobility  Contracture of muscle, multiple sites  ONSET DATE: 01/25/22  SUBJECTIVE:                                                                                                                                                                                           SUBJECTIVE STATEMENT: No falls just unsteady, if I am careful I do better  PERTINENT HISTORY:  Cerebral infarct 10/21/21, lumbar fusion, recurrent falls   PAIN:  Are you having pain? Yes: NPRS scale: 0/10 Pain location: low back Pain description: ache  Aggravating factors: being on my feet  Relieving factors: movement  PRECAUTIONS: Fall  WEIGHT BEARING RESTRICTIONS: No  FALLS:  Has patient fallen in last 6 months? Yes. Number of falls every few days   LIVING ENVIRONMENT: Lives with: lives with  their spouse Lives in: House/apartment Stairs: Yes: Internal: 20 steps; on right going up Has following  equipment at home: Gilford Rile - 4 wheeled and trekking pole  OCCUPATION: Retired  PLOF: Independent  PATIENT GOALS: to not fall and go deer hunting again  NEXT MD VISIT:   OBJECTIVE:   DIAGNOSTIC FINDINGS:    LUMBAR SPINE FINDINGS: 1. Wide laminectomy and posterior pedicle screw and rod fixation at L4-5 without residual or recurrent stenosis. 2. Progressive adjacent level disease at L3-4 with moderate right and mild left subarticular narrowing and moderate foraminal stenosis bilaterally. Progression is predominantly due to facet disease 3. Progressive moderate facet hypertrophy at L1-2 with a progressive rightward disc protrusion resulting in progressive moderate right foraminal stenosis. Asymmetric endplate marrow edema on the right at L1-2 is consistent with progressive disease as well. 4. Otherwise stable degenerative change without other focal stenosis.   THORACIC SPINE FINDINGS:  On sagittal views the vertebral bodies have normal height and alignment.  Hemangioma within T9 vertebral body and slightly within T10 vertebral body.  Anterior kyphosis.  Mild scoliosis convex right centered at T8 level. The spinal cord is normal in size and appearance. The paraspinal soft tissues are unremarkable.     On axial views there is no spinal stenosis or foraminal narrowing.  Limited views of the aorta, kidneys, liver, lungs and paraspinal muscles are notable for multiple large right renal cysts ranging in size from 1.5 to 7 cm.   COGNITION: Overall cognitive status: Within functional limits for tasks assessed     SENSATION: WFL  MUSCLE LENGTH: Hamstrings: mod tightness on both sides  POSTURE: rounded shoulders and forward head  LUMBAR ROM:   AROM eval  Flexion Limited due to HS tightness  Extension WFL  Right lateral flexion Tightness in R side  Left lateral flexion WFL   Right rotation WFL  Left rotation Tightness on R side   (Blank rows = not tested)  LOWER EXTREMITY ROM:   grossly WFL    LOWER EXTREMITY MMT:  grossly 4+/5   FUNCTIONAL TESTS:  5 times sit to stand: 20s Timed up and go (TUG): 16s  GAIT: Distance walked: in clinic distances Assistive device utilized:  trekking pole Level of assistance: SBA Comments: ataxic gait pattern, increased lateral sway, decreased safety awareness   TODAY'S TREATMENT:                                                                                                                              DATE:  04/12/22 Bike Level 4 x 6 minutes Worked on stairs with one handrail Cone toe touches in P bars 4" and 6" toe touches with and without hand hold Worked on hip, ankle and step strategies for balance Backward walking in pbars Side step over bar Stepping over objects using cane Cone hand touches and having him come all the way up  04/07/22 Bike L3.5 x 6 min Alt 4 in box taps w/ Trekking pole 2x10 S2S LE on airex 2x10 elevated mat  Step ups on airex with trekking pole  2x5 each Leg press 70lb 2x12, Heel raises 40lb 3x15  Seated Rows and lats 35lb 3x12 Chest press 20lb 2x12 Shoulder Ext 10lb 2x10   04/05/21 Bike L3.5 x 6 min Hamstring curls 45lb 3x12 Leg Ext 15lb 3x12 S2S 2x10 some on airex  Side step in // bars   03/29/21 Bike L3 x 6 min Shoulder Ext 10lb 2x10 Alt 6in box taps x10 each Lateral 4 in step ups S2S on Airex pad 2x10 On airex w/ ball toss Side step in // bars on airex Tandem walking in // bars  Hamstring curls 45lb 3x12 Leg Ext 15lb 3x12   03/25/22 NuStep L5 x6 min  Leg press 80lb 2x12  Seated Rows and lats 35lb 3x10 Chest press 35lb 2x10, 25lb x10 S2S LE on airex 2x10 Shoulder Ext 10lb 2x10 Calf stretch     03/23/22 NuStep L5 x 6 min Alt box taps 4in x10, 6in x10 with trekking pole  Resisted gait 20lb all directions x 4 S2S OHP yellow 2x10 Seated HS curls 45lb  3x12 Leg Ext 20lb 3x12 Leg Press 80lb 2x10, Heel raises 80lb 2x12   03/16/22 Bike L4 x 6 min Side step over WaTE bar frequent LOB min assist to recover Side step and tandem walking in // bars, side step on and off airex in // bars Seated HS curls 45lb 3x12 Leg Ext 20lb 3x12 Seated Rows & Lats 45lb 2x12 Chest press 25lb 2x10 Leg press 60lb 3x12   PATIENT EDUCATION:  Education details: HEP and POC Person educated: Patient Education method: Explanation Education comprehension: verbalized understanding  HOME EXERCISE PROGRAM: Marching, 3 way leg kicks, mini squats, heel raises, STS from chair   ASSESSMENT:  CLINICAL IMPRESSION: Patient is a 82 y.o. male who was seen today for physical therapy treatment. Patient needs Mod/Max A for all high level balance activities, he loses balance to the back, we worked on ankle, hip and step strategies to help with this using the wall behind him.  He is impulsive and at times is inattentive   OBJECTIVE IMPAIRMENTS: Abnormal gait, decreased balance, decreased coordination, and difficulty walking.   ACTIVITY LIMITATIONS: squatting, stairs, transfers, and locomotion level  REHAB POTENTIAL: Fair dependent on carry over and patient compliance  CLINICAL DECISION MAKING: Stable/uncomplicated  EVALUATION COMPLEXITY: Low  GOALS: Goals reviewed with patient? Yes  SHORT TERM GOALS: Target date: 04/06/22  Patient will be independent with initial HEP. Goal status: Met 03/07/22  2.  Patient will be educated on strategies to decrease risk of falls.  Goal status: 03/07/22   LONG TERM GOALS: Target date: 05/18/22  Patient will be independent with advanced/ongoing HEP to improve outcomes and carryover.  Goal status: Met  2.  Patient will demonstrate decreased fall risk by scoring < 12 sec on TUG. Baseline: 16s Goal status: Progressing 04/05/22 - 12.25 sec  3.  Patient will demonstrate improved functional LE strength as demonstrated by < 15s on  5xSTS. Baseline: 20s Goal status: Met 11.09 03/25/22  4.  Patient will score 42 on Berg Balance test to demonstrate lower risk of falls. (MCID= 8 points) .  Baseline: 36/56 Goal status: Met 42/56 04/05/22  5.  Patient will report no falls in a 4 weeks period. Baseline: falls every few days Goal status: On going  PLAN:  PT FREQUENCY: 2x/week  PT DURATION: 12 weeks  PLANNED INTERVENTIONS: Therapeutic exercises, Therapeutic activity, Neuromuscular re-education, Balance training, Gait training, Patient/Family education, Self Care, Joint mobilization, Stair training, Cryotherapy, Moist heat, and Manual therapy.  PLAN FOR NEXT SESSION: work and focus on balance strategies   Jearld Lesch, PT 04/12/2022, 7:59 AM

## 2022-04-12 NOTE — Telephone Encounter (Signed)
ICD-10-CM   1. AF (paroxysmal atrial fibrillation) (HCC)  I48.0 flecainide (TAMBOCOR) 100 MG tablet     Medications Discontinued During This Encounter  Medication Reason   flecainide (TAMBOCOR) 100 MG tablet Reorder    Meds ordered this encounter  Medications   flecainide (TAMBOCOR) 100 MG tablet    Sig: Take 1 tablet (100 mg total) by mouth 2 (two) times daily.    Dispense:  180 tablet    Refill:  1

## 2022-04-13 ENCOUNTER — Encounter: Payer: Self-pay | Admitting: Cardiology

## 2022-04-14 ENCOUNTER — Ambulatory Visit: Payer: Medicare Other | Admitting: Physical Therapy

## 2022-04-15 ENCOUNTER — Encounter: Payer: Self-pay | Admitting: Physical Therapy

## 2022-04-15 ENCOUNTER — Ambulatory Visit: Payer: Medicare Other | Admitting: Physical Therapy

## 2022-04-15 DIAGNOSIS — R2689 Other abnormalities of gait and mobility: Secondary | ICD-10-CM

## 2022-04-15 DIAGNOSIS — R296 Repeated falls: Secondary | ICD-10-CM

## 2022-04-15 DIAGNOSIS — M6249 Contracture of muscle, multiple sites: Secondary | ICD-10-CM

## 2022-04-15 DIAGNOSIS — M5459 Other low back pain: Secondary | ICD-10-CM

## 2022-04-15 NOTE — Therapy (Signed)
OUTPATIENT PHYSICAL THERAPY THORACOLUMBAR TREATMENT   Patient Name: Brandon Mendoza MRN: [id number] DOB:03-31-1940, 82 y.o., male Today's Date: 04/15/2022  END OF SESSION:  PT End of Session - 04/15/22 1017     Visit Number 13    Date for PT Re-Evaluation 05/18/22    Authorization Type [employer]    PT Start Time 1012    PT Stop Time 1100    PT Time Calculation (min) 48 min    Equipment Utilized During Treatment Gait belt    Activity Tolerance Patient tolerated treatment well    Behavior During Therapy WFL for tasks assessed/performed             Past Medical History:  Diagnosis Date   Acquired clawfoot, right foot 12/16/2021   Anxiety    Atrophy of calf muscles 10/21/2021   Duplex calves was negative for significant blockage EMG was done at [facility] MRI lumbar spine was also done at [facility]   Carotid artery occlusion    COPD (chronic obstructive pulmonary disease) (HCC)    Depression    Dysrhythmia    PAF, atrial tachycardia   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   The spinal cord appears normal. 2.   No spinal stenosis. 3.   Mild multilevel degenerative changes as detailed above that do not lead to spinal stenosis or nerve root compression. 4.   T2 hyperintense foci within the pons consistent with chronic microvascular ischemic changes.   Loop - Medtronic Linq 10/22/2020 10/22/2020   Nocturia    Peripheral vascular disease (HCC)    Sleep apnea    on 6 cm, nasal pillow   Stroke ([place]) 05/01/2011   ischemic   Weakness generalized 10/21/2021   Severe, legs and arms   Past Surgical History:  Procedure Laterality Date   CAROTID ENDARTERECTOMY  12/09/11   Left cea   ENDARTERECTOMY  12/09/2011   Procedure: ENDARTERECTOMY CAROTID;  Surgeon: Rosetta Posner, MD;  Location: [facility];  Service: Vascular;  Laterality: Left;   EYE SURGERY     rt retina detachment   HAMMER TOE SURGERY  2004   HERNIA REPAIR  2006   SHOULDER ARTHROSCOPY W/ ROTATOR CUFF REPAIR  2010   TONSILLECTOMY     Patient Active Problem List   Diagnosis Date Noted   Macular degeneration 03/01/2022   Myofascial pain 02/10/2022   Lumbar spondylosis 01/03/2022   Acquired clawfoot, left foot 12/16/2021   Acquired clawfoot, right foot 12/16/2021   Claw foot 12/14/2021   Gait abnormality 12/10/2021   Spinal stenosis at L4-L5 level 12/10/2021   Ataxia 11/22/2021   Lightheadedness 11/02/2021   Claudication of lower extremity ([place]) 10/21/2021   Atrophy of calf muscles 10/21/2021   Weakness generalized 10/21/2021   Focal neurological deficit 10/21/2021   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   Pain in joint of right hip 09/14/2021   High arches 07/22/2021   Hammertoes of both feet 07/22/2021   Neuropathy 07/22/2021   Constipation 05/28/2021   Disequilibrium 05/28/2021   Personal history of colonic polyps 05/28/2021   Renal cyst 05/28/2021   Gastroesophageal reflux disease without esophagitis 05/05/2021   Ingrowing left great toenail 02/15/2021  S/P lumbar fusion 12/24/2020   Spinal stenosis of lumbar region 12/04/2020   Acquired complex renal cyst 11/23/2020   Functional gait abnormality 11/04/2020   Idiopathic neuropathy 11/02/2020   Tinea pedis of both feet 10/28/2020   Loop - Medtronic Linq 10/22/2020 10/22/2020   Benign hypertension with CKD (chronic kidney disease) stage III ([place]) 10/05/2020   Fall with injury 10/05/2020   Hematoma 09/16/2020   Normocytic anemia 09/14/2020   AF (paroxysmal atrial fibrillation) ([place]) 09/14/2020   Chronic anticoagulation 09/14/2020   Hyponatremia 09/14/2020   Pain in left foot 01/22/2020   Acquired thrombophilia ([place]) 12/30/2019   Pain in joint of right knee 12/27/2019   Recurrent falls  11/17/2019   Paroxysmal atrial fibrillation ([place]) 10/22/2019   Pain in right foot 10/14/2019   Falls 10/07/2019   Ataxia involving legs 08/12/2019   OSA on CPAP 06/20/2018   Urge incontinence 04/13/2018   Urinary urgency 04/13/2018   Pain in left knee 03/01/2018   Other intervertebral disc degeneration, lumbar region 06/16/2017   Acquired bilateral hammer toes 10/05/2015   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   Generalized anxiety disorder 05/12/2015   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[place]) 05/10/2015   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[place]) 04/10/2014   History of left-sided carotid endarterectomy 04/10/2014   Essential hypertension 04/10/2014   Occlusion and stenosis of carotid artery without mention of cerebral infarction 12/15/2011   Habitual alcohol use 12/01/2011   Carotid artery stenosis, symptomatic 11/30/2011   Cerebral infarction ([place]) 11/29/2011   Hyperlipidemia     PCP: Berniece Pap  REFERRING PROVIDER: Berniece Pap  REFERRING DIAG: M54.42,M54.41,G89.29   Rationale for Evaluation and Treatment: Rehabilitation  THERAPY DIAG:  Other low back pain  Repeated falls  Other abnormalities of gait and mobility  Contracture of muscle, multiple sites  ONSET DATE: 01/25/22  SUBJECTIVE:                                                                                                                                                                                           SUBJECTIVE STATEMENT: Reports has a fall last night getting up to go to the bathroom, reports that he tripped on a rug  PERTINENT HISTORY:  Cerebral infarct 10/21/21, lumbar fusion, recurrent falls   PAIN:  Are you having pain? Yes: NPRS scale: 0/10 Pain location: low back Pain description: ache  Aggravating factors: being on my feet  Relieving factors: movement  PRECAUTIONS: Fall  WEIGHT BEARING RESTRICTIONS: No  FALLS:  Has patient fallen in  last 6 months? Yes. Number of falls every few days   LIVING ENVIRONMENT: Lives with:  lives with their spouse Lives in: House/apartment Stairs: Yes: Internal: 20 steps; on right going up Has following equipment at home: Dan Humphreys - 4 wheeled and trekking pole  OCCUPATION: Retired  PLOF: Independent  PATIENT GOALS: to not fall and go deer hunting again  NEXT MD VISIT:   OBJECTIVE:   DIAGNOSTIC FINDINGS:    LUMBAR SPINE FINDINGS: 1. Wide laminectomy and posterior pedicle screw and rod fixation at L4-5 without residual or recurrent stenosis. 2. Progressive adjacent level disease at L3-4 with moderate right and mild left subarticular narrowing and moderate foraminal stenosis bilaterally. Progression is predominantly due to facet disease 3. Progressive moderate facet hypertrophy at L1-2 with a progressive rightward disc protrusion resulting in progressive moderate right foraminal stenosis. Asymmetric endplate marrow edema on the right at L1-2 is consistent with progressive disease as well. 4. Otherwise stable degenerative change without other focal stenosis.   THORACIC SPINE FINDINGS:  On sagittal views the vertebral bodies have normal height and alignment.  Hemangioma within T9 vertebral body and slightly within T10 vertebral body.  Anterior kyphosis.  Mild scoliosis convex right centered at T8 level. The spinal cord is normal in size and appearance. The paraspinal soft tissues are unremarkable.     On axial views there is no spinal stenosis or foraminal narrowing.  Limited views of the aorta, kidneys, liver, lungs and paraspinal muscles are notable for multiple large right renal cysts ranging in size from 1.5 to 7 cm.   COGNITION: Overall cognitive status: Within functional limits for tasks assessed     SENSATION: WFL  MUSCLE LENGTH: Hamstrings: mod tightness on both sides  POSTURE: rounded shoulders and forward head  LUMBAR ROM:   AROM eval  Flexion Limited due to HS  tightness  Extension WFL  Right lateral flexion Tightness in R side  Left lateral flexion WFL  Right rotation WFL  Left rotation Tightness on R side   (Blank rows = not tested)  LOWER EXTREMITY ROM:   grossly WFL    LOWER EXTREMITY MMT:  grossly 4+/5   FUNCTIONAL TESTS:  5 times sit to stand: 20s Timed up and go (TUG): 16s   04/15/22:  3 minute walk test:  had to sit and rest at 45 seconds and went 210 feet GAIT: Distance walked: in clinic distances Assistive device utilized:  trekking pole Level of assistance: SBA Comments: ataxic gait pattern, increased lateral sway, decreased safety awareness   TODAY'S TREATMENT:                                                                                                                              DATE:  04/15/22 Nustep level 5 x 6 minutes In pbars side stepping and back ward walking 6" toe touches Butt touches and shoulder blade touches working on balance strategies On airex in the corner reaching for cones Solid surface ball toss Walking ball toss 3 minute walk test made it to 45 seconds and  210 feet Resisted gait 30# all direction  04/12/22 Bike Level 4 x 6 minutes Worked on stairs with one handrail Cone toe touches in P bars 4" and 6" toe touches with and without hand hold Worked on hip, ankle and step strategies for balance Backward walking in pbars Side step over bar Stepping over objects using cane Cone hand touches and having him come all the way up  04/07/22 Bike L3.5 x 6 min Alt 4 in box taps w/ Trekking pole 2x10 S2S LE on airex 2x10 elevated mat  Step ups on airex with trekking pole 2x5 each Leg press 70lb 2x12, Heel raises 40lb 3x15  Seated Rows and lats 35lb 3x12 Chest press 20lb 2x12 Shoulder Ext 10lb 2x10   04/05/21 Bike L3.5 x 6 min Hamstring curls 45lb 3x12 Leg Ext 15lb 3x12 S2S 2x10 some on airex  Side step in // bars   03/29/21 Bike L3 x 6 min Shoulder Ext 10lb 2x10 Alt 6in box  taps x10 each Lateral 4 in step ups S2S on Airex pad 2x10 On airex w/ ball toss Side step in // bars on airex Tandem walking in // bars  Hamstring curls 45lb 3x12 Leg Ext 15lb 3x12   03/25/22 NuStep L5 x6 min  Leg press 80lb 2x12  Seated Rows and lats 35lb 3x10 Chest press 35lb 2x10, 25lb x10 S2S LE on airex 2x10 Shoulder Ext 10lb 2x10 Calf stretch     03/23/22 NuStep L5 x 6 min Alt box taps 4in x10, 6in x10 with trekking pole  Resisted gait 20lb all directions x 4 S2S OHP yellow 2x10 Seated HS curls 45lb 3x12 Leg Ext 20lb 3x12 Leg Press 80lb 2x10, Heel raises 80lb 2x12   03/16/22 Bike L4 x 6 min Side step over WaTE bar frequent LOB min assist to recover Side step and tandem walking in // bars, side step on and off airex in // bars Seated HS curls 45lb 3x12 Leg Ext 20lb 3x12 Seated Rows & Lats 45lb 2x12 Chest press 25lb 2x10 Leg press 60lb 3x12   PATIENT EDUCATION:  Education details: HEP and POC Person educated: Patient Education method: Explanation Education comprehension: verbalized understanding  HOME EXERCISE PROGRAM: Marching, 3 way leg kicks, mini squats, heel raises, STS from chair   ASSESSMENT:  CLINICAL IMPRESSION: Patient is a 82 y.o. male who was seen today for physical therapy treatment. Patient needs Mod/Max A for all high level balance activities, hhe had a fall last night tripped over a rug, we talked about getting rid of rugs as they are a tripping hazard.  He did fatigue quickly numerous times today and needed to sit, did the 3 minute walk test  OBJECTIVE IMPAIRMENTS: Abnormal gait, decreased balance, decreased coordination, and difficulty walking.   ACTIVITY LIMITATIONS: squatting, stairs, transfers, and locomotion level  REHAB POTENTIAL: Fair dependent on carry over and patient compliance  CLINICAL DECISION MAKING: Stable/uncomplicated  EVALUATION COMPLEXITY: Low  GOALS: Goals reviewed with patient? Yes  SHORT TERM GOALS: Target  date: 04/06/22  Patient will be independent with initial HEP. Goal status: Met 03/07/22  2.  Patient will be educated on strategies to decrease risk of falls.  Goal status: 03/07/22   LONG TERM GOALS: Target date: 05/18/22  Patient will be independent with advanced/ongoing HEP to improve outcomes and carryover.  Goal status: Met  2.  Patient will demonstrate decreased fall risk by scoring < 12 sec on TUG. Baseline: 16s Goal status: Progressing 04/05/22 - 12.25 sec  3.  Patient  will demonstrate improved functional LE strength as demonstrated by < 15s on 5xSTS. Baseline: 20s Goal status: Met 11.09 03/25/22  4.  Patient will score 42 on Berg Balance test to demonstrate lower risk of falls. (MCID= 8 points) .  Baseline: 36/56 Goal status: Met 42/56 04/05/22  5.  Patient will report no falls in a 4 weeks period. Baseline: falls every few days Goal status: On going  PLAN:  PT FREQUENCY: 2x/week  PT DURATION: 12 weeks  PLANNED INTERVENTIONS: Therapeutic exercises, Therapeutic activity, Neuromuscular re-education, Balance training, Gait training, Patient/Family education, Self Care, Joint mobilization, Stair training, Cryotherapy, Moist heat, and Manual therapy.  PLAN FOR NEXT SESSION: work and focus on balance strategies   Jearld Lesch, PT 04/15/2022, 10:18 AM

## 2022-04-18 ENCOUNTER — Encounter: Payer: Self-pay | Admitting: Physical Therapy

## 2022-04-18 ENCOUNTER — Ambulatory Visit: Payer: Medicare Other | Admitting: Physical Therapy

## 2022-04-18 DIAGNOSIS — R296 Repeated falls: Secondary | ICD-10-CM | POA: Diagnosis not present

## 2022-04-18 DIAGNOSIS — M5459 Other low back pain: Secondary | ICD-10-CM

## 2022-04-18 DIAGNOSIS — M6249 Contracture of muscle, multiple sites: Secondary | ICD-10-CM

## 2022-04-18 DIAGNOSIS — R2689 Other abnormalities of gait and mobility: Secondary | ICD-10-CM

## 2022-04-18 NOTE — Therapy (Signed)
OUTPATIENT PHYSICAL THERAPY THORACOLUMBAR TREATMENT   Patient Name: Brandon Mendoza MRN: [id number] DOB:[birth year], 82 y.o., male Today's Date: 04/18/2022  END OF SESSION:  PT End of Session - 04/18/22 0759     Visit Number 14    Date for PT Re-Evaluation 05/18/22    Authorization Type [employer]    PT Start Time 0755    PT Stop Time 0840    PT Time Calculation (min) 45 min    Activity Tolerance Patient tolerated treatment well    Behavior During Therapy [facility] for tasks assessed/performed             Past Medical History:  Diagnosis Date   Acquired clawfoot, right foot 12/16/2021   Anxiety    Atrophy of calf muscles 10/21/2021   Duplex calves was negative for significant blockage EMG was done at [facility] MRI lumbar spine was also done at [facility]   Carotid artery occlusion    COPD (chronic obstructive pulmonary disease) (HCC)    Depression    Dysrhythmia    PAF, atrial tachycardia   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   The spinal cord appears normal. 2.   No spinal stenosis. 3.   Mild multilevel degenerative changes as detailed above that do not lead to spinal stenosis or nerve root compression. 4.   T2 hyperintense foci within the pons consistent with chronic microvascular ischemic changes.   Loop - Medtronic Linq 10/22/2020 10/22/2020   Nocturia    Peripheral vascular disease (HCC)    Sleep apnea    on 6 cm, nasal pillow   Stroke ([place]) 05/01/2011   ischemic   Weakness generalized 10/21/2021   Severe, legs and arms   Past Surgical History:  Procedure Laterality Date   CAROTID ENDARTERECTOMY  12/09/11   Left cea   ENDARTERECTOMY  12/09/2011   Procedure: ENDARTERECTOMY CAROTID;  Surgeon: Rosetta Posner, MD;  Location: [facility];  Service: Vascular;   Laterality: Left;   EYE SURGERY     rt retina detachment   HAMMER TOE SURGERY  2004   HERNIA REPAIR  2006   SHOULDER ARTHROSCOPY W/ ROTATOR CUFF REPAIR  2010   TONSILLECTOMY     Patient Active Problem List   Diagnosis Date Noted   Macular degeneration 03/01/2022   Myofascial pain 02/10/2022   Lumbar spondylosis 01/03/2022   Acquired clawfoot, left foot 12/16/2021   Acquired clawfoot, right foot 12/16/2021   Claw foot 12/14/2021   Gait abnormality 12/10/2021   Spinal stenosis at L4-L5 level 12/10/2021   Ataxia 11/22/2021   Lightheadedness 11/02/2021   Claudication of lower extremity ([place]) 10/21/2021   Atrophy of calf muscles 10/21/2021   Weakness generalized 10/21/2021   Focal neurological deficit 10/21/2021   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   Pain in joint of right hip 09/14/2021   High arches 07/22/2021   Hammertoes of both feet 07/22/2021   Neuropathy 07/22/2021   Constipation 05/28/2021   Disequilibrium 05/28/2021   Personal history of colonic polyps 05/28/2021   Renal cyst 05/28/2021   Gastroesophageal reflux disease without esophagitis 05/05/2021   Ingrowing left great toenail 02/15/2021   S/P lumbar fusion 12/24/2020   Spinal stenosis of  lumbar region 12/04/2020   Acquired complex renal cyst 11/23/2020   Functional gait abnormality 11/04/2020   Idiopathic neuropathy 11/02/2020   Tinea pedis of both feet 10/28/2020   Loop - Medtronic Linq 10/22/2020 10/22/2020   Benign hypertension with CKD (chronic kidney disease) stage III ([place]) 10/05/2020   Fall with injury 10/05/2020   Hematoma 09/16/2020   Normocytic anemia 09/14/2020   AF (paroxysmal atrial fibrillation) ([place]) 09/14/2020   Chronic anticoagulation 09/14/2020   Hyponatremia 09/14/2020   Pain in left foot 01/22/2020   Acquired thrombophilia ([place]) 12/30/2019   Pain in joint of right knee 12/27/2019   Recurrent falls 11/17/2019   Paroxysmal atrial fibrillation ([place])  10/22/2019   Pain in right foot 10/14/2019   Falls 10/07/2019   Ataxia involving legs 08/12/2019   OSA on CPAP 06/20/2018   Urge incontinence 04/13/2018   Urinary urgency 04/13/2018   Pain in left knee 03/01/2018   Other intervertebral disc degeneration, lumbar region 06/16/2017   Acquired bilateral hammer toes 10/05/2015   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   Generalized anxiety disorder 05/12/2015   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/10/2015   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   History of left-sided carotid endarterectomy 04/10/2014   Essential hypertension 04/10/2014   Occlusion and stenosis of carotid artery without mention of cerebral infarction 12/15/2011   Habitual alcohol use 12/01/2011   Carotid artery stenosis, symptomatic 11/30/2011   Cerebral infarction ([place]) 11/29/2011   Hyperlipidemia     PCP: Berniece Pap  REFERRING PROVIDER: Berniece Pap  REFERRING DIAG: M54.42,M54.41,G89.29   Rationale for Evaluation and Treatment: Rehabilitation  THERAPY DIAG:  Other low back pain  Repeated falls  Other abnormalities of gait and mobility  Contracture of muscle, multiple sites  ONSET DATE: 01/25/22  SUBJECTIVE:                                                                                                                                                                                           SUBJECTIVE STATEMENT: No falls over the weekend PERTINENT HISTORY:  Cerebral infarct 10/21/21, lumbar fusion, recurrent falls   PAIN:  Are you having pain? Yes: NPRS scale: 0/10 Pain location: low back Pain description: ache  Aggravating factors: being on my feet  Relieving factors: movement  PRECAUTIONS: Fall  WEIGHT BEARING RESTRICTIONS: No  FALLS:  Has patient fallen in last 6 months? Yes. Number of falls every few days   LIVING ENVIRONMENT: Lives with: lives with their spouse Lives in:  House/apartment Stairs: Yes: Internal: 20 steps; on right going up Has following equipment at home: Gilford Rile - 4 wheeled  and trekking pole  OCCUPATION: Retired  PLOF: Independent  PATIENT GOALS: to not fall and go deer hunting again  NEXT MD VISIT:   OBJECTIVE:   DIAGNOSTIC FINDINGS:    LUMBAR SPINE FINDINGS: 1. Wide laminectomy and posterior pedicle screw and rod fixation at L4-5 without residual or recurrent stenosis. 2. Progressive adjacent level disease at L3-4 with moderate right and mild left subarticular narrowing and moderate foraminal stenosis bilaterally. Progression is predominantly due to facet disease 3. Progressive moderate facet hypertrophy at L1-2 with a progressive rightward disc protrusion resulting in progressive moderate right foraminal stenosis. Asymmetric endplate marrow edema on the right at L1-2 is consistent with progressive disease as well. 4. Otherwise stable degenerative change without other focal stenosis.   THORACIC SPINE FINDINGS:  On sagittal views the vertebral bodies have normal height and alignment.  Hemangioma within T9 vertebral body and slightly within T10 vertebral body.  Anterior kyphosis.  Mild scoliosis convex right centered at T8 level. The spinal cord is normal in size and appearance. The paraspinal soft tissues are unremarkable.     On axial views there is no spinal stenosis or foraminal narrowing.  Limited views of the aorta, kidneys, liver, lungs and paraspinal muscles are notable for multiple large right renal cysts ranging in size from 1.5 to 7 cm.   COGNITION: Overall cognitive status: Within functional limits for tasks assessed     SENSATION: WFL  MUSCLE LENGTH: Hamstrings: mod tightness on both sides  POSTURE: rounded shoulders and forward head  LUMBAR ROM:   AROM eval  Flexion Limited due to HS tightness  Extension WFL  Right lateral flexion Tightness in R side  Left lateral flexion WFL  Right rotation WFL  Left  rotation Tightness on R side   (Blank rows = not tested)  LOWER EXTREMITY ROM:   grossly WFL    LOWER EXTREMITY MMT:  grossly 4+/5   FUNCTIONAL TESTS:  5 times sit to stand: 20s Timed up and go (TUG): 16s   04/15/22:  3 minute walk test:  had to sit and rest at 71minute 45 seconds and went 210 feet GAIT: Distance walked: in clinic distances Assistive device utilized:  trekking pole Level of assistance: SBA Comments: ataxic gait pattern, increased lateral sway, decreased safety awareness   TODAY'S TREATMENT:                                                                                                                              DATE:  04/18/22 Nustep level 5 x 6 minutes Tmill push and pull 20 seconds 3 x each needed Max A for the push backward Side stepping on the airx balance beam Side stepping in pbars without holding 12" toe touch on and off airex with SPC  Side step on and off airex In corner on airex head turns , eyes ope and closed 4" step up and over  04/15/22 Nustep level 5 x 6 minutes In pbars side  stepping and back ward walking 6" toe touches Butt touches and shoulder blade touches working on balance strategies On airex in the corner reaching for cones Solid surface ball toss Walking ball toss 3 minute walk test made it to 54minute 45 seconds and 210 feet Resisted gait 30# all direction  04/12/22 Bike Level 4 x 6 minutes Worked on stairs with one handrail Cone toe touches in P bars 4" and 6" toe touches with and without hand hold Worked on hip, ankle and step strategies for balance Backward walking in pbars Side step over bar Stepping over objects using cane Cone hand touches and having him come all the way up  04/07/22 Bike L3.5 x 6 min Alt 4 in box taps w/ Trekking pole 2x10 S2S LE on airex 2x10 elevated mat  Step ups on airex with trekking pole 2x5 each Leg press 70lb 2x12, Heel raises 40lb 3x15  Seated Rows and lats 35lb 3x12 Chest press 20lb  2x12 Shoulder Ext 10lb 2x10   04/05/21 Bike L3.5 x 6 min Hamstring curls 45lb 3x12 Leg Ext 15lb 3x12 S2S 2x10 some on airex  Side step in // bars   03/29/21 Bike L3 x 6 min Shoulder Ext 10lb 2x10 Alt 6in box taps x10 each Lateral 4 in step ups S2S on Airex pad 2x10 On airex w/ ball toss Side step in // bars on airex Tandem walking in // bars  Hamstring curls 45lb 3x12 Leg Ext 15lb 3x12   03/25/22 NuStep L5 x6 min  Leg press 80lb 2x12  Seated Rows and lats 35lb 3x10 Chest press 35lb 2x10, 25lb x10 S2S LE on airex 2x10 Shoulder Ext 10lb 2x10 Calf stretch    PATIENT EDUCATION:  Education details: HEP and POC Person educated: Patient Education method: Explanation Education comprehension: verbalized understanding  HOME EXERCISE PROGRAM: Marching, 3 way leg kicks, mini squats, heel raises, STS from chair   ASSESSMENT:  CLINICAL IMPRESSION: Patient is a 82 y.o. male who was seen today for physical therapy treatment. Patient needs Mod/Max A for all high level balance activities, today was one of his best days and was independent with most however he reported feeling very good this morning, still when he loses his balance he requires the max A or he is going down, seemed to have a little more safety awareness and less impulsiviity  OBJECTIVE IMPAIRMENTS: Abnormal gait, decreased balance, decreased coordination, and difficulty walking.   ACTIVITY LIMITATIONS: squatting, stairs, transfers, and locomotion level  REHAB POTENTIAL: Fair dependent on carry over and patient compliance  CLINICAL DECISION MAKING: Stable/uncomplicated  EVALUATION COMPLEXITY: Low  GOALS: Goals reviewed with patient? Yes  SHORT TERM GOALS: Target date: 04/06/22  Patient will be independent with initial HEP. Goal status: Met 03/07/22  2.  Patient will be educated on strategies to decrease risk of falls.  Goal status: 03/07/22   LONG TERM GOALS: Target date: 05/18/22  Patient will be  independent with advanced/ongoing HEP to improve outcomes and carryover.  Goal status: Met  2.  Patient will demonstrate decreased fall risk by scoring < 12 sec on TUG. Baseline: 16s Goal status: Progressing 04/05/22 - 12.25 sec  3.  Patient will demonstrate improved functional LE strength as demonstrated by < 15s on 5xSTS. Baseline: 20s Goal status: Met 11.09 03/25/22  4.  Patient will score 42 on Berg Balance test to demonstrate lower risk of falls. (MCID= 8 points) .  Baseline: 36/56 Goal status: Met 42/56 04/05/22  5.  Patient will report no falls  in a 4 weeks period. Baseline: falls every few days Goal status: On going  PLAN:  PT FREQUENCY: 2x/week  PT DURATION: 12 weeks  PLANNED INTERVENTIONS: Therapeutic exercises, Therapeutic activity, Neuromuscular re-education, Balance training, Gait training, Patient/Family education, Self Care, Joint mobilization, Stair training, Cryotherapy, Moist heat, and Manual therapy.  PLAN FOR NEXT SESSION: work and focus on balance strategies   Dari Carpenito W, PT 04/18/2022, 8:00 AM

## 2022-04-19 ENCOUNTER — Ambulatory Visit: Payer: Medicare Other | Admitting: Physical Therapy

## 2022-04-21 ENCOUNTER — Encounter: Payer: Self-pay | Admitting: Physical Therapy

## 2022-04-21 ENCOUNTER — Ambulatory Visit: Payer: Medicare Other | Admitting: Physical Therapy

## 2022-04-21 DIAGNOSIS — M6249 Contracture of muscle, multiple sites: Secondary | ICD-10-CM

## 2022-04-21 DIAGNOSIS — R296 Repeated falls: Secondary | ICD-10-CM | POA: Diagnosis not present

## 2022-04-21 DIAGNOSIS — R2689 Other abnormalities of gait and mobility: Secondary | ICD-10-CM

## 2022-04-21 DIAGNOSIS — M5459 Other low back pain: Secondary | ICD-10-CM

## 2022-04-21 NOTE — Therapy (Signed)
OUTPATIENT PHYSICAL THERAPY THORACOLUMBAR TREATMENT   Patient Name: Brandon Mendoza MRN: [id number] DOB:1940-05-14, 82 y.o., male Today's Date: 04/21/2022  END OF SESSION:  PT End of Session - 04/21/22 0840     Visit Number 15    Date for PT Re-Evaluation 05/18/22    Authorization Type [employer]    PT Start Time 0840    PT Stop Time 0930    PT Time Calculation (min) 50 min    Equipment Utilized During Treatment Gait belt    Activity Tolerance Patient tolerated treatment well    Behavior During Therapy WFL for tasks assessed/performed             Past Medical History:  Diagnosis Date   Acquired clawfoot, right foot 12/16/2021   Anxiety    Atrophy of calf muscles 10/21/2021   Duplex calves was negative for significant blockage EMG was done at [facility] MRI lumbar spine was also done at [facility]   Carotid artery occlusion    COPD (chronic obstructive pulmonary disease) (HCC)    Depression    Dysrhythmia    PAF, atrial tachycardia   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   The spinal cord appears normal. 2.   No spinal stenosis. 3.   Mild multilevel degenerative changes as detailed above that do not lead to spinal stenosis or nerve root compression. 4.   T2 hyperintense foci within the pons consistent with chronic microvascular ischemic changes.   Loop - Medtronic Linq 10/22/2020 10/22/2020   Nocturia    Peripheral vascular disease (HCC)    Sleep apnea    on 6 cm, nasal pillow   Stroke (HCC) 05/01/2011   ischemic   Weakness generalized 10/21/2021   Severe, legs and arms   Past Surgical History:  Procedure Laterality Date   CAROTID ENDARTERECTOMY  12/09/11   Left cea   ENDARTERECTOMY  12/09/2011   Procedure: ENDARTERECTOMY CAROTID;  Surgeon: Larina Earthly, MD;  Location: [facility] OR;  Service: Vascular;  Laterality: Left;   EYE SURGERY     rt retina detachment   HAMMER TOE SURGERY  2004   HERNIA REPAIR  2006   SHOULDER ARTHROSCOPY W/ ROTATOR CUFF REPAIR  2010   TONSILLECTOMY     Patient Active Problem List   Diagnosis Date Noted   Macular degeneration 03/01/2022   Myofascial pain 02/10/2022   Lumbar spondylosis 01/03/2022   Acquired clawfoot, left foot 12/16/2021   Acquired clawfoot, right foot 12/16/2021   Claw foot 12/14/2021   Gait abnormality 12/10/2021   Spinal stenosis at L4-L5 level 12/10/2021   Ataxia 11/22/2021   Lightheadedness 11/02/2021   Claudication of lower extremity (HCC) 10/21/2021   Atrophy of calf muscles 10/21/2021   Weakness generalized 10/21/2021   Focal neurological deficit 10/21/2021   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   Pain in joint of right hip 09/14/2021   High arches 07/22/2021   Hammertoes of both feet 07/22/2021   Neuropathy 07/22/2021   Constipation 05/28/2021   Disequilibrium 05/28/2021   Personal history of colonic polyps 05/28/2021   Renal cyst 05/28/2021   Gastroesophageal reflux disease without esophagitis 05/05/2021   Ingrowing left great toenail 02/15/2021  S/P lumbar fusion 12/24/2020   Spinal stenosis of lumbar region 12/04/2020   Acquired complex renal cyst 11/23/2020   Functional gait abnormality 11/04/2020   Idiopathic neuropathy 11/02/2020   Tinea pedis of both feet 10/28/2020   Loop - Medtronic Linq 10/22/2020 10/22/2020   Benign hypertension with CKD (chronic kidney disease) stage III ([place]) 10/05/2020   Fall with injury 10/05/2020   Hematoma 09/16/2020   Normocytic anemia 09/14/2020   AF (paroxysmal atrial fibrillation) ([place]) 09/14/2020   Chronic anticoagulation 09/14/2020   Hyponatremia 09/14/2020   Pain in left foot 01/22/2020   Acquired thrombophilia ([place]) 12/30/2019   Pain in joint of right knee 12/27/2019   Recurrent falls  11/17/2019   Paroxysmal atrial fibrillation ([place]) 10/22/2019   Pain in right foot 10/14/2019   Falls 10/07/2019   Ataxia involving legs 08/12/2019   OSA on CPAP 06/20/2018   Urge incontinence 04/13/2018   Urinary urgency 04/13/2018   Pain in left knee 03/01/2018   Other intervertebral disc degeneration, lumbar region 06/16/2017   Acquired bilateral hammer toes 10/05/2015   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   Generalized anxiety disorder 05/12/2015   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[place]) 05/10/2015   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[place]) 04/10/2014   History of left-sided carotid endarterectomy 04/10/2014   Essential hypertension 04/10/2014   Occlusion and stenosis of carotid artery without mention of cerebral infarction 12/15/2011   Habitual alcohol use 12/01/2011   Carotid artery stenosis, symptomatic 11/30/2011   Cerebral infarction ([place]) 11/29/2011   Hyperlipidemia     PCP: Berniece Pap  REFERRING PROVIDER: Berniece Pap  REFERRING DIAG: M54.42,M54.41,G89.29   Rationale for Evaluation and Treatment: Rehabilitation  THERAPY DIAG:  Other low back pain  Repeated falls  Other abnormalities of gait and mobility  Contracture of muscle, multiple sites  ONSET DATE: 01/25/22  SUBJECTIVE:                                                                                                                                                                                           SUBJECTIVE STATEMENT: No falls reports not feeling as well today , he does report going to the gym yesterday PERTINENT HISTORY:  Cerebral infarct 10/21/21, lumbar fusion, recurrent falls   PAIN:  Are you having pain? Yes: NPRS scale: 0/10 Pain location: low back Pain description: ache  Aggravating factors: being on my feet  Relieving factors: movement  PRECAUTIONS: Fall  WEIGHT BEARING RESTRICTIONS: No  FALLS:  Has patient fallen in last 6 months?  Yes. Number of falls every few days   LIVING ENVIRONMENT: Lives with: lives with their spouse  Lives in: House/apartment Stairs: Yes: Internal: 20 steps; on right going up Has following equipment at home: Gilford Rile - 4 wheeled and trekking pole  OCCUPATION: Retired  PLOF: Independent  PATIENT GOALS: to not fall and go deer hunting again  NEXT MD VISIT:   OBJECTIVE:   DIAGNOSTIC FINDINGS:    LUMBAR SPINE FINDINGS: 1. Wide laminectomy and posterior pedicle screw and rod fixation at L4-5 without residual or recurrent stenosis. 2. Progressive adjacent level disease at L3-4 with moderate right and mild left subarticular narrowing and moderate foraminal stenosis bilaterally. Progression is predominantly due to facet disease 3. Progressive moderate facet hypertrophy at L1-2 with a progressive rightward disc protrusion resulting in progressive moderate right foraminal stenosis. Asymmetric endplate marrow edema on the right at L1-2 is consistent with progressive disease as well. 4. Otherwise stable degenerative change without other focal stenosis.   THORACIC SPINE FINDINGS:  On sagittal views the vertebral bodies have normal height and alignment.  Hemangioma within T9 vertebral body and slightly within T10 vertebral body.  Anterior kyphosis.  Mild scoliosis convex right centered at T8 level. The spinal cord is normal in size and appearance. The paraspinal soft tissues are unremarkable.     On axial views there is no spinal stenosis or foraminal narrowing.  Limited views of the aorta, kidneys, liver, lungs and paraspinal muscles are notable for multiple large right renal cysts ranging in size from 1.5 to 7 cm.   COGNITION: Overall cognitive status: Within functional limits for tasks assessed     SENSATION: WFL  MUSCLE LENGTH: Hamstrings: mod tightness on both sides  POSTURE: rounded shoulders and forward head  LUMBAR ROM:   AROM eval  Flexion Limited due to HS tightness   Extension WFL  Right lateral flexion Tightness in R side  Left lateral flexion WFL  Right rotation WFL  Left rotation Tightness on R side   (Blank rows = not tested)  LOWER EXTREMITY ROM:   grossly WFL    LOWER EXTREMITY MMT:  grossly 4+/5   FUNCTIONAL TESTS:  5 times sit to stand: 20s Timed up and go (TUG): 16s   04/15/22:  3 minute walk test:  had to sit and rest at 49minute 45 seconds and went 210 feet GAIT: Distance walked: in clinic distances Assistive device utilized:  trekking pole Level of assistance: SBA Comments: ataxic gait pattern, increased lateral sway, decreased safety awareness   TODAY'S TREATMENT:                                                                                                                              DATE:  04/20/22 Elliptical level 1 x 2 minutes Bike Level 5 x 5 mintues with power burst every minute Tmill 20 seconds push and pull x 3 each CGA/min A Cone toe touches and hand touches Volleyball 15# straight arm pulls CGA for balance 45# seated rows 35# lats 25# chest press all 2x10 6" toe touches, and step  ups 20# bow pulls On mat walking figure 8's  04/18/22 Nustep level 5 x 6 minutes Tmill push and pull 20 seconds 3 x each needed Max A for the push backward Side stepping on the airx balance beam Side stepping in pbars without holding 12" toe touch on and off airex with SPC  Side step on and off airex In corner on airex head turns , eyes ope and closed 4" step up and over  04/15/22 Nustep level 5 x 6 minutes In pbars side stepping and back ward walking 6" toe touches Butt touches and shoulder blade touches working on balance strategies On airex in the corner reaching for cones Solid surface ball toss Walking ball toss 3 minute walk test made it to 70minute 45 seconds and 210 feet Resisted gait 30# all direction  04/12/22 Bike Level 4 x 6 minutes Worked on stairs with one handrail Cone toe touches in P bars 4" and 6" toe  touches with and without hand hold Worked on hip, ankle and step strategies for balance Backward walking in pbars Side step over bar Stepping over objects using cane Cone hand touches and having him come all the way up  04/07/22 Bike L3.5 x 6 min Alt 4 in box taps w/ Trekking pole 2x10 S2S LE on airex 2x10 elevated mat  Step ups on airex with trekking pole 2x5 each Leg press 70lb 2x12, Heel raises 40lb 3x15  Seated Rows and lats 35lb 3x12 Chest press 20lb 2x12 Shoulder Ext 10lb 2x10   04/05/21 Bike L3.5 x 6 min Hamstring curls 45lb 3x12 Leg Ext 15lb 3x12 S2S 2x10 some on airex  Side step in // bars   03/29/21 Bike L3 x 6 min Shoulder Ext 10lb 2x10 Alt 6in box taps x10 each Lateral 4 in step ups S2S on Airex pad 2x10 On airex w/ ball toss Side step in // bars on airex Tandem walking in // bars  Hamstring curls 45lb 3x12 Leg Ext 15lb 3x12   03/25/22 NuStep L5 x6 min  Leg press 80lb 2x12  Seated Rows and lats 35lb 3x10 Chest press 35lb 2x10, 25lb x10 S2S LE on airex 2x10 Shoulder Ext 10lb 2x10 Calf stretch    PATIENT EDUCATION:  Education details: HEP and POC Person educated: Patient Education method: Explanation Education comprehension: verbalized understanding  HOME EXERCISE PROGRAM: Marching, 3 way leg kicks, mini squats, heel raises, STS from chair   ASSESSMENT:  CLINICAL IMPRESSION: Patient is a 82 y.o. male who was seen today for physical therapy treatment. Patient needs Min A for much of the treatment, but with higher level stuff he needs the mod/max A I worked on the balance strategies with him.  OBJECTIVE IMPAIRMENTS: Abnormal gait, decreased balance, decreased coordination, and difficulty walking.   ACTIVITY LIMITATIONS: squatting, stairs, transfers, and locomotion level  REHAB POTENTIAL: Fair dependent on carry over and patient compliance  CLINICAL DECISION MAKING: Stable/uncomplicated  EVALUATION COMPLEXITY: Low  GOALS: Goals reviewed with  patient? Yes  SHORT TERM GOALS: Target date: 04/06/22  Patient will be independent with initial HEP. Goal status: Met 03/07/22  2.  Patient will be educated on strategies to decrease risk of falls.  Goal status: 03/07/22   LONG TERM GOALS: Target date: 05/18/22  Patient will be independent with advanced/ongoing HEP to improve outcomes and carryover.  Goal status: Met  2.  Patient will demonstrate decreased fall risk by scoring < 12 sec on TUG. Baseline: 16s Goal status: Progressing 04/05/22 - 12.25 sec  3.  Patient will demonstrate improved functional LE strength as demonstrated by < 15s on 5xSTS. Baseline: 20s Goal status: Met 11.09 03/25/22  4.  Patient will score 42 on Berg Balance test to demonstrate lower risk of falls. (MCID= 8 points) .  Baseline: 36/56 Goal status: Met 42/56 04/05/22  5.  Patient will report no falls in a 4 weeks period. Baseline: falls every few days Goal status: On going  PLAN:  PT FREQUENCY: 2x/week  PT DURATION: 12 weeks  PLANNED INTERVENTIONS: Therapeutic exercises, Therapeutic activity, Neuromuscular re-education, Balance training, Gait training, Patient/Family education, Self Care, Joint mobilization, Stair training, Cryotherapy, Moist heat, and Manual therapy.  PLAN FOR NEXT SESSION: work and focus on balance strategies   Sumner Boast, PT 04/21/2022, 8:49 AM

## 2022-04-22 ENCOUNTER — Encounter: Payer: Self-pay | Admitting: Internal Medicine

## 2022-04-24 ENCOUNTER — Encounter: Payer: Self-pay | Admitting: Cardiology

## 2022-04-25 NOTE — Telephone Encounter (Signed)
Form patient

## 2022-04-26 ENCOUNTER — Ambulatory Visit: Payer: Medicare Other | Admitting: Physical Therapy

## 2022-04-26 ENCOUNTER — Encounter: Payer: Self-pay | Admitting: Physical Therapy

## 2022-04-26 ENCOUNTER — Encounter: Payer: Self-pay | Admitting: Internal Medicine

## 2022-04-26 ENCOUNTER — Telehealth: Payer: Self-pay | Admitting: Internal Medicine

## 2022-04-26 DIAGNOSIS — R2689 Other abnormalities of gait and mobility: Secondary | ICD-10-CM

## 2022-04-26 DIAGNOSIS — R296 Repeated falls: Secondary | ICD-10-CM | POA: Diagnosis not present

## 2022-04-26 DIAGNOSIS — M6249 Contracture of muscle, multiple sites: Secondary | ICD-10-CM

## 2022-04-26 DIAGNOSIS — M5459 Other low back pain: Secondary | ICD-10-CM

## 2022-04-26 NOTE — Telephone Encounter (Signed)
..  Please look into the following billing question below:  -Pt would like to know why CPE was not covered. States he believes the coding is incorrect.    Has patient reached out to billing?  Yes   If yes, what did billing advise the patient?  Pt call Medicare to see why 03/01/22 visit wasn't covered  Is the patient calling in regard to a [employer] or [employer]?  No If yes, please have patient reach out to [employer] or [employer] located on their bill received.   If patient was advised by [employer] or [employer] to reach out to the office, what did their billing department advise? N/A  Please have patient send copy of bill to the office.   Patient Name: Brandon Mendoza MRN: [id number] [place] ID:  [id number] DOB: Aug 03, 1940 Date of Service:  03/01/22 Amount:  $325.00

## 2022-04-26 NOTE — Therapy (Signed)
OUTPATIENT PHYSICAL THERAPY THORACOLUMBAR TREATMENT   Patient Name: Brandon Mendoza MRN: [id number] DOB:1940/06/16, 82 y.o., male Today's Date: 04/26/2022  END OF SESSION:  PT End of Session - 04/26/22 0935     Visit Number 16    Date for PT Re-Evaluation 05/18/22    Authorization Type [employer]    PT Start Time 0930    PT Stop Time 1015    PT Time Calculation (min) 45 min    Activity Tolerance Patient tolerated treatment well    Behavior During Therapy [facility] for tasks assessed/performed             Past Medical History:  Diagnosis Date   Acquired clawfoot, right foot 12/16/2021   Anxiety    Atrophy of calf muscles 10/21/2021   Duplex calves was negative for significant blockage EMG was done at [facility] MRI lumbar spine was also done at [facility]   Carotid artery occlusion    COPD (chronic obstructive pulmonary disease) (HCC)    Depression    Dysrhythmia    PAF, atrial tachycardia   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   The spinal cord appears normal. 2.   No spinal stenosis. 3.   Mild multilevel degenerative changes as detailed above that do not lead to spinal stenosis or nerve root compression. 4.   T2 hyperintense foci within the pons consistent with chronic microvascular ischemic changes.   Loop - Medtronic Linq 10/22/2020 10/22/2020   Nocturia    Peripheral vascular disease (HCC)    Sleep apnea    on 6 cm, nasal pillow   Stroke ([place]) 05/01/2011   ischemic   Weakness generalized 10/21/2021   Severe, legs and arms   Past Surgical History:  Procedure Laterality Date   CAROTID ENDARTERECTOMY  12/09/11   Left cea   ENDARTERECTOMY  12/09/2011   Procedure: ENDARTERECTOMY CAROTID;  Surgeon: Rosetta Posner, MD;  Location: [facility];  Service: Vascular;   Laterality: Left;   EYE SURGERY     rt retina detachment   HAMMER TOE SURGERY  2004   HERNIA REPAIR  2006   SHOULDER ARTHROSCOPY W/ ROTATOR CUFF REPAIR  2010   TONSILLECTOMY     Patient Active Problem List   Diagnosis Date Noted   Macular degeneration 03/01/2022   Myofascial pain 02/10/2022   Lumbar spondylosis 01/03/2022   Acquired clawfoot, left foot 12/16/2021   Acquired clawfoot, right foot 12/16/2021   Claw foot 12/14/2021   Gait abnormality 12/10/2021   Spinal stenosis at L4-L5 level 12/10/2021   Ataxia 11/22/2021   Lightheadedness 11/02/2021   Claudication of lower extremity ([place]) 10/21/2021   Atrophy of calf muscles 10/21/2021   Weakness generalized 10/21/2021   Focal neurological deficit 10/21/2021   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   Pain in joint of right hip 09/14/2021   High arches 07/22/2021   Hammertoes of both feet 07/22/2021   Neuropathy 07/22/2021   Constipation 05/28/2021   Disequilibrium 05/28/2021   Personal history of colonic polyps 05/28/2021   Renal cyst 05/28/2021   Gastroesophageal reflux disease without esophagitis 05/05/2021   Ingrowing left great toenail 02/15/2021   S/P lumbar fusion 12/24/2020   Spinal stenosis of  lumbar region 12/04/2020   Acquired complex renal cyst 11/23/2020   Functional gait abnormality 11/04/2020   Idiopathic neuropathy 11/02/2020   Tinea pedis of both feet 10/28/2020   Loop - Medtronic Linq 10/22/2020 10/22/2020   Benign hypertension with 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) 10/05/2020   Fall with injury 10/05/2020   Hematoma 09/16/2020   Normocytic anemia 09/14/2020   AF (paroxysmal atrial fibrillation) (HCC) 09/14/2020   Chronic anticoagulation 09/14/2020   Hyponatremia 09/14/2020   Pain in left foot 01/22/2020   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   Pain in joint of right knee 12/27/2019   Recurrent falls 11/17/2019   Paroxysmal atrial fibrillation (HCC)  10/22/2019   Pain in right foot 10/14/2019   Falls 10/07/2019   Ataxia involving legs 08/12/2019   OSA on CPAP 06/20/2018   Urge incontinence 04/13/2018   Urinary urgency 04/13/2018   Pain in left knee 03/01/2018   Other intervertebral disc degeneration, lumbar region 06/16/2017   Acquired bilateral hammer toes 10/05/2015   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   Generalized anxiety disorder 05/12/2015   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   History of left-sided carotid endarterectomy 04/10/2014   Essential hypertension 04/10/2014   Occlusion and stenosis of carotid artery without mention of cerebral infarction 12/15/2011   Habitual alcohol use 12/01/2011   Carotid artery stenosis, symptomatic 11/30/2011   Cerebral infarction (HCC) 11/29/2011   Hyperlipidemia     PCP: Glenetta Hew  REFERRING PROVIDER: Glenetta Hew  REFERRING DIAG: M54.42,M54.41,G89.29   Rationale for Evaluation and Treatment: Rehabilitation  THERAPY DIAG:  Other low back pain  Repeated falls  Other abnormalities of gait and mobility  Contracture of muscle, multiple sites  ONSET DATE: 01/25/22  SUBJECTIVE:                                                                                                                                                                                           SUBJECTIVE STATEMENT: No falls reports that he went to the gym and is very fatigued  PERTINENT HISTORY:  Cerebral infarct 10/21/21, lumbar fusion, recurrent falls   PAIN:  Are you having pain? Yes: NPRS scale: 0/10 Pain location: low back Pain description: ache  Aggravating factors: being on my feet  Relieving factors: movement  PRECAUTIONS: Fall  WEIGHT BEARING RESTRICTIONS: No  FALLS:  Has patient fallen in last 6 months? Yes. Number of falls every few days   LIVING ENVIRONMENT: Lives with:  lives with their spouse Lives in: House/apartment Stairs: Yes: Internal: 20 steps; on right going up  Has following equipment at home: Walker - 4 wheeled and trekking pole  OCCUPATION: Retired  PLOF: Independent  PATIENT GOALS: to not fall and go deer hunting again  NEXT MD VISIT:   OBJECTIVE:   DIAGNOSTIC FINDINGS:    LUMBAR SPINE FINDINGS: 1. Wide laminectomy and posterior pedicle screw and rod fixation at L4-5 without residual or recurrent stenosis. 2. Progressive adjacent level disease at L3-4 with moderate right and mild left subarticular narrowing and moderate foraminal stenosis bilaterally. Progression is predominantly due to facet disease 3. Progressive moderate facet hypertrophy at L1-2 with a progressive rightward disc protrusion resulting in progressive moderate right foraminal stenosis. Asymmetric endplate marrow edema on the right at L1-2 is consistent with progressive disease as well. 4. Otherwise stable degenerative change without other focal stenosis.   THORACIC SPINE FINDINGS:  On sagittal views the vertebral bodies have normal height and alignment.  Hemangioma within T9 vertebral body and slightly within T10 vertebral body.  Anterior kyphosis.  Mild scoliosis convex right centered at T8 level. The spinal cord is normal in size and appearance. The paraspinal soft tissues are unremarkable.     On axial views there is no spinal stenosis or foraminal narrowing.  Limited views of the aorta, kidneys, liver, lungs and paraspinal muscles are notable for multiple large right renal cysts ranging in size from 1.5 to 7 cm.   COGNITION: Overall cognitive status: Within functional limits for tasks assessed     SENSATION: WFL  MUSCLE LENGTH: Hamstrings: mod tightness on both sides  POSTURE: rounded shoulders and forward head  LUMBAR ROM:   AROM eval  Flexion Limited due to HS tightness  Extension WFL  Right lateral flexion Tightness in R side  Left lateral  flexion WFL  Right rotation WFL  Left rotation Tightness on R side   (Blank rows = not tested)  LOWER EXTREMITY ROM:   grossly WFL    LOWER EXTREMITY MMT:  grossly 4+/5   FUNCTIONAL TESTS:  5 times sit to stand: 20s Timed up and go (TUG): 16s   04/15/22:  3 minute walk test:  had to sit and rest at 40minute 45 seconds and went 210 feet GAIT: Distance walked: in clinic distances Assistive device utilized:  trekking pole Level of assistance: SBA Comments: ataxic gait pattern, increased lateral sway, decreased safety awareness   TODAY'S TREATMENT:                                                                                                                              DATE:  04/26/22 Nustep level 5 x 6 mintues Leg extension and abduction 5# pulleys 2x10 CGA Supine feet on ball K2C, trunk rotation, small bridges and isometric abs Passive LE stretches Quadraped hip extension and then alternating arms and leg lifts with CGA due to poor balance Ambulated with him out the back door through the grass and uneven terrain to his car with CGA, negotiated a curb  04/20/22 Elliptical  level 1 x 2 minutes Bike Level 5 x 5 mintues with power burst every minute Tmill 20 seconds push and pull x 3 each CGA/min A Cone toe touches and hand touches Volleyball 15# straight arm pulls CGA for balance 45# seated rows 35# lats 25# chest press all 2x10 6" toe touches, and step ups 20# bow pulls On mat walking figure 8's  04/18/22 Nustep level 5 x 6 minutes Tmill push and pull 20 seconds 3 x each needed Max A for the push backward Side stepping on the airx balance beam Side stepping in pbars without holding 12" toe touch on and off airex with SPC  Side step on and off airex In corner on airex head turns , eyes ope and closed 4" step up and over  04/15/22 Nustep level 5 x 6 minutes In pbars side stepping and back ward walking 6" toe touches Butt touches and shoulder blade touches working on  balance strategies On airex in the corner reaching for cones Solid surface ball toss Walking ball toss 3 minute walk test made it to 47minute 45 seconds and 210 feet Resisted gait 30# all direction  04/12/22 Bike Level 4 x 6 minutes Worked on stairs with one handrail Cone toe touches in P bars 4" and 6" toe touches with and without hand hold Worked on hip, ankle and step strategies for balance Backward walking in pbars Side step over bar Stepping over objects using cane Cone hand touches and having him come all the way up  04/07/22 Bike L3.5 x 6 min Alt 4 in box taps w/ Trekking pole 2x10 S2S LE on airex 2x10 elevated mat  Step ups on airex with trekking pole 2x5 each Leg press 70lb 2x12, Heel raises 40lb 3x15  Seated Rows and lats 35lb 3x12 Chest press 20lb 2x12 Shoulder Ext 10lb 2x10   PATIENT EDUCATION:  Education details: HEP and POC Person educated: Patient Education method: Explanation Education comprehension: verbalized understanding  HOME EXERCISE PROGRAM: Marching, 3 way leg kicks, mini squats, heel raises, STS from chair   ASSESSMENT:  CLINICAL IMPRESSION: Patient is a 82 y.o. male who was seen today for physical therapy treatment. Patient was fatigued today due to going to the gym the past two days I cautioned him on this and making sure he does not over due and then be too fatigued.  I added some hip strength and needed CGA for this very weak legs  OBJECTIVE IMPAIRMENTS: Abnormal gait, decreased balance, decreased coordination, and difficulty walking.   ACTIVITY LIMITATIONS: squatting, stairs, transfers, and locomotion level  REHAB POTENTIAL: Fair dependent on carry over and patient compliance  CLINICAL DECISION MAKING: Stable/uncomplicated  EVALUATION COMPLEXITY: Low  GOALS: Goals reviewed with patient? Yes  SHORT TERM GOALS: Target date: 04/06/22  Patient will be independent with initial HEP. Goal status: Met 03/07/22  2.  Patient will be  educated on strategies to decrease risk of falls.  Goal status: 03/07/22   LONG TERM GOALS: Target date: 05/18/22  Patient will be independent with advanced/ongoing HEP to improve outcomes and carryover.  Goal status: Met  2.  Patient will demonstrate decreased fall risk by scoring < 12 sec on TUG. Baseline: 16s Goal status: Progressing 04/05/22 - 12.25 sec  3.  Patient will demonstrate improved functional LE strength as demonstrated by < 15s on 5xSTS. Baseline: 20s Goal status: Met 11.09 03/25/22  4.  Patient will score 42 on Berg Balance test to demonstrate lower risk of falls. (MCID= 8 points) .  Baseline: 36/56 Goal status: Met 42/56 04/05/22  5.  Patient will report no falls in a 4 weeks period. Baseline: falls every few days Goal status: On going had a fall week of 04/18/22  PLAN:  PT FREQUENCY: 2x/week  PT DURATION: 12 weeks  PLANNED INTERVENTIONS: Therapeutic exercises, Therapeutic activity, Neuromuscular re-education, Balance training, Gait training, Patient/Family education, Self Care, Joint mobilization, Stair training, Cryotherapy, Moist heat, and Manual therapy.  PLAN FOR NEXT SESSION: work and focus on balance strategies   Jearld Lesch, PT 04/26/2022, 9:36 AM

## 2022-04-27 ENCOUNTER — Ambulatory Visit: Payer: Medicare Other

## 2022-04-27 DIAGNOSIS — R296 Repeated falls: Secondary | ICD-10-CM

## 2022-04-27 DIAGNOSIS — R2689 Other abnormalities of gait and mobility: Secondary | ICD-10-CM

## 2022-04-27 DIAGNOSIS — M5459 Other low back pain: Secondary | ICD-10-CM

## 2022-04-27 NOTE — Therapy (Signed)
OUTPATIENT PHYSICAL THERAPY THORACOLUMBAR TREATMENT   Patient Name: Brandon Mendoza MRN: [id number] DOB:12-21-1940, 82 y.o., male Today's Date: 04/27/2022  END OF SESSION:  PT End of Session - 04/27/22 1058     Visit Number 17    Date for PT Re-Evaluation 05/18/22    Authorization Type [employer]    PT Start Time 1056    PT Stop Time 1140    PT Time Calculation (min) 44 min    Activity Tolerance Patient tolerated treatment well    Behavior During Therapy WFL for tasks assessed/performed             Past Medical History:  Diagnosis Date   Acquired clawfoot, right foot 12/16/2021   Anxiety    Atrophy of calf muscles 10/21/2021   Duplex calves was negative for significant blockage [facility] MRI lumbar spine was also done at [facility]   Carotid artery occlusion    COPD (chronic obstructive pulmonary disease) (HCC)    Depression    Dysrhythmia    PAF, atrial tachycardia   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   The spinal cord appears normal. 2.   No spinal stenosis. 3.   Mild multilevel degenerative changes as detailed above that do not lead to spinal stenosis or nerve root compression. 4.   T2 hyperintense foci within the pons consistent with chronic microvascular ischemic changes.   Loop - Medtronic Linq 10/22/2020 10/22/2020   Nocturia    Peripheral vascular disease (HCC)    Sleep apnea    on 6 cm, nasal pillow   Stroke (HCC) 05/01/2011   ischemic   Weakness generalized 10/21/2021   Severe, legs and arms   Past Surgical History:  Procedure Laterality Date   CAROTID ENDARTERECTOMY  12/09/11   Left cea   ENDARTERECTOMY  12/09/2011   Procedure: ENDARTERECTOMY CAROTID;  Surgeon: Larina Earthly, MD;  Location: [facility] OR;  Service: Vascular;   Laterality: Left;   EYE SURGERY     rt retina detachment   HAMMER TOE SURGERY  2004   HERNIA REPAIR  2006   SHOULDER ARTHROSCOPY W/ ROTATOR CUFF REPAIR  2010   TONSILLECTOMY     Patient Active Problem List   Diagnosis Date Noted   Macular degeneration 03/01/2022   Myofascial pain 02/10/2022   Lumbar spondylosis 01/03/2022   Acquired clawfoot, left foot 12/16/2021   Acquired clawfoot, right foot 12/16/2021   Claw foot 12/14/2021   Gait abnormality 12/10/2021   Spinal stenosis at L4-L5 level 12/10/2021   Ataxia 11/22/2021   Lightheadedness 11/02/2021   Claudication of lower extremity (HCC) 10/21/2021   Atrophy of calf muscles 10/21/2021   Weakness generalized 10/21/2021   Focal neurological deficit 10/21/2021   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   Pain in joint of right hip 09/14/2021   High arches 07/22/2021   Hammertoes of both feet 07/22/2021   Neuropathy 07/22/2021   Constipation 05/28/2021   Disequilibrium 05/28/2021   Personal history of colonic polyps 05/28/2021   Renal cyst 05/28/2021   Gastroesophageal reflux disease without esophagitis 05/05/2021   Ingrowing left great toenail 02/15/2021   S/P lumbar fusion 12/24/2020   Spinal stenosis of  lumbar region 12/04/2020   Acquired complex renal cyst 11/23/2020   Functional gait abnormality 11/04/2020   Idiopathic neuropathy 11/02/2020   Tinea pedis of both feet 10/28/2020   Loop - Medtronic Linq 10/22/2020 10/22/2020   Benign hypertension with CKD (chronic kidney disease) stage III ([place]) 10/05/2020   Fall with injury 10/05/2020   Hematoma 09/16/2020   Normocytic anemia 09/14/2020   AF (paroxysmal atrial fibrillation) ([place]) 09/14/2020   Chronic anticoagulation 09/14/2020   Hyponatremia 09/14/2020   Pain in left foot 01/22/2020   Acquired thrombophilia ([place]) 12/30/2019   Pain in joint of right knee 12/27/2019   Recurrent falls 11/17/2019   Paroxysmal atrial fibrillation ([place])  10/22/2019   Pain in right foot 10/14/2019   Falls 10/07/2019   Ataxia involving legs 08/12/2019   OSA on CPAP 06/20/2018   Urge incontinence 04/13/2018   Urinary urgency 04/13/2018   Pain in left knee 03/01/2018   Other intervertebral disc degeneration, lumbar region 06/16/2017   Acquired bilateral hammer toes 10/05/2015   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   Generalized anxiety disorder 05/12/2015   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[place]) 05/10/2015   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[place]) 04/10/2014   History of left-sided carotid endarterectomy 04/10/2014   Essential hypertension 04/10/2014   Occlusion and stenosis of carotid artery without mention of cerebral infarction 12/15/2011   Habitual alcohol use 12/01/2011   Carotid artery stenosis, symptomatic 11/30/2011   Cerebral infarction ([place]) 11/29/2011   Hyperlipidemia     PCP: Berniece Pap  REFERRING PROVIDER: Berniece Pap  REFERRING DIAG: M54.42,M54.41,G89.29   Rationale for Evaluation and Treatment: Rehabilitation  THERAPY DIAG:  Repeated falls  Other low back pain  Other abnormalities of gait and mobility  ONSET DATE: 01/25/22  SUBJECTIVE:                                                                                                                                                                                           SUBJECTIVE STATEMENT: I had to change my appointment tomorrow because I have something going on with my throat.   PERTINENT HISTORY:  Cerebral infarct 10/21/21, lumbar fusion, recurrent falls   PAIN:  Are you having pain? Yes: NPRS scale: 0/10 Pain location: low back Pain description: ache  Aggravating factors: being on my feet  Relieving factors: movement  PRECAUTIONS: Fall  WEIGHT BEARING RESTRICTIONS: No  FALLS:  Has patient fallen in last 6 months? Yes. Number of falls every few days   LIVING ENVIRONMENT: Lives with: lives with  their spouse Lives in: House/apartment Stairs: Yes: Internal: 20 steps; on right going up Has following  equipment at home: Gilford Rile - 4 wheeled and trekking pole  OCCUPATION: Retired  PLOF: Independent  PATIENT GOALS: to not fall and go deer hunting again  NEXT MD VISIT:   OBJECTIVE:   DIAGNOSTIC FINDINGS:    LUMBAR SPINE FINDINGS: 1. Wide laminectomy and posterior pedicle screw and rod fixation at L4-5 without residual or recurrent stenosis. 2. Progressive adjacent level disease at L3-4 with moderate right and mild left subarticular narrowing and moderate foraminal stenosis bilaterally. Progression is predominantly due to facet disease 3. Progressive moderate facet hypertrophy at L1-2 with a progressive rightward disc protrusion resulting in progressive moderate right foraminal stenosis. Asymmetric endplate marrow edema on the right at L1-2 is consistent with progressive disease as well. 4. Otherwise stable degenerative change without other focal stenosis.   THORACIC SPINE FINDINGS:  On sagittal views the vertebral bodies have normal height and alignment.  Hemangioma within T9 vertebral body and slightly within T10 vertebral body.  Anterior kyphosis.  Mild scoliosis convex right centered at T8 level. The spinal cord is normal in size and appearance. The paraspinal soft tissues are unremarkable.     On axial views there is no spinal stenosis or foraminal narrowing.  Limited views of the aorta, kidneys, liver, lungs and paraspinal muscles are notable for multiple large right renal cysts ranging in size from 1.5 to 7 cm.   COGNITION: Overall cognitive status: Within functional limits for tasks assessed     SENSATION: WFL  MUSCLE LENGTH: Hamstrings: mod tightness on both sides  POSTURE: rounded shoulders and forward head  LUMBAR ROM:   AROM eval  Flexion Limited due to HS tightness  Extension WFL  Right lateral flexion Tightness in R side  Left lateral flexion WFL   Right rotation WFL  Left rotation Tightness on R side   (Blank rows = not tested)  LOWER EXTREMITY ROM:   grossly WFL    LOWER EXTREMITY MMT:  grossly 4+/5   FUNCTIONAL TESTS:  5 times sit to stand: 20s Timed up and go (TUG): 16s   04/15/22:  3 minute walk test:  had to sit and rest at 87minute 45 seconds and went 210 feet GAIT: Distance walked: in clinic distances Assistive device utilized:  trekking pole Level of assistance: SBA Comments: ataxic gait pattern, increased lateral sway, decreased safety awareness   TODAY'S TREATMENT:                                                                                                                              DATE:  04/27/22 NuStep L5 x26mins  Leg ext 35# 2x10 HS curls 45# 2x10 STS on airex 2x10 Standing on airex playing catch- one LOB backwards onto mat table 45# seated rows 2x10 35# lats 2x10 Leg press 60# 2x10 Shoulder ext 5# 2x10 Box taps 4" Side steps on beam  04/26/22 Nustep level 5 x 6 mintues Leg extension and abduction 5# pulleys 2x10 CGA Supine feet on ball K2C, trunk  rotation, small bridges and isometric abs Passive LE stretches Quadraped hip extension and then alternating arms and leg lifts with CGA due to poor balance Ambulated with him out the back door through the grass and uneven terrain to his car with CGA, negotiated a curb  04/20/22 Elliptical level 1 x 2 minutes Bike Level 5 x 5 mintues with power burst every minute Tmill 20 seconds push and pull x 3 each CGA/min A Cone toe touches and hand touches Volleyball 15# straight arm pulls CGA for balance 45# seated rows 35# lats 25# chest press all 2x10 6" toe touches, and step ups 20# bow pulls On mat walking figure 8's  04/18/22 Nustep level 5 x 6 minutes Tmill push and pull 20 seconds 3 x each needed Max A for the push backward Side stepping on the airx balance beam Side stepping in pbars without holding 12" toe touch on and off airex with SPC   Side step on and off airex In corner on airex head turns , eyes ope and closed 4" step up and over  04/15/22 Nustep level 5 x 6 minutes In pbars side stepping and back ward walking 6" toe touches Butt touches and shoulder blade touches working on balance strategies On airex in the corner reaching for cones Solid surface ball toss Walking ball toss 3 minute walk test made it to 68minute 45 seconds and 210 feet Resisted gait 30# all direction  04/12/22 Bike Level 4 x 6 minutes Worked on stairs with one handrail Cone toe touches in P bars 4" and 6" toe touches with and without hand hold Worked on hip, ankle and step strategies for balance Backward walking in pbars Side step over bar Stepping over objects using cane Cone hand touches and having him come all the way up  04/07/22 Bike L3.5 x 6 min Alt 4 in box taps w/ Trekking pole 2x10 S2S LE on airex 2x10 elevated mat  Step ups on airex with trekking pole 2x5 each Leg press 70lb 2x12, Heel raises 40lb 3x15  Seated Rows and lats 35lb 3x12 Chest press 20lb 2x12 Shoulder Ext 10lb 2x10   PATIENT EDUCATION:  Education details: HEP and POC Person educated: Patient Education method: Explanation Education comprehension: verbalized understanding  HOME EXERCISE PROGRAM: Marching, 3 way leg kicks, mini squats, heel raises, STS from chair   ASSESSMENT:  CLINICAL IMPRESSION: More steady with STS on airex but still plopping down when sitting. Patient states he feels like he starting to get more stable and stronger. Falls back after 8 reps of alternating heel taps on 4" and needed to be cued to sit and rest because his legs were fatigued. He tends to want to push through exercises at the expense of his safety and needs reminders to slow down.   OBJECTIVE IMPAIRMENTS: Abnormal gait, decreased balance, decreased coordination, and difficulty walking.   ACTIVITY LIMITATIONS: squatting, stairs, transfers, and locomotion level  REHAB  POTENTIAL: Fair dependent on carry over and patient compliance  CLINICAL DECISION MAKING: Stable/uncomplicated  EVALUATION COMPLEXITY: Low  GOALS: Goals reviewed with patient? Yes  SHORT TERM GOALS: Target date: 04/06/22  Patient will be independent with initial HEP. Goal status: Met 03/07/22  2.  Patient will be educated on strategies to decrease risk of falls.  Goal status: 03/07/22   LONG TERM GOALS: Target date: 05/18/22  Patient will be independent with advanced/ongoing HEP to improve outcomes and carryover.  Goal status: Met  2.  Patient will demonstrate decreased fall risk by  scoring < 12 sec on TUG. Baseline: 16s Goal status: Progressing 04/05/22 - 12.25 sec  3.  Patient will demonstrate improved functional LE strength as demonstrated by < 15s on 5xSTS. Baseline: 20s Goal status: Met 11.09 03/25/22  4.  Patient will score 42 on Berg Balance test to demonstrate lower risk of falls. (MCID= 8 points) .  Baseline: 36/56 Goal status: Met 42/56 04/05/22  5.  Patient will report no falls in a 4 weeks period. Baseline: falls every few days Goal status: On going had a fall week of 04/18/22  PLAN:  PT FREQUENCY: 2x/week  PT DURATION: 12 weeks  PLANNED INTERVENTIONS: Therapeutic exercises, Therapeutic activity, Neuromuscular re-education, Balance training, Gait training, Patient/Family education, Self Care, Joint mobilization, Stair training, Cryotherapy, Moist heat, and Manual therapy.  PLAN FOR NEXT SESSION: work and focus on balance strategies   Cassie Freer, PT 04/27/2022, 11:39 AM

## 2022-04-28 ENCOUNTER — Ambulatory Visit: Payer: Medicare Other

## 2022-04-28 DIAGNOSIS — K1120 Sialoadenitis, unspecified: Secondary | ICD-10-CM | POA: Insufficient documentation

## 2022-04-28 NOTE — Telephone Encounter (Signed)
Called patient and informed him that coding team would be able to remove code necessary. Patient states his wife got the same thing done that day but did not receive a bill. I advised patient once again to contact [employer] to confirm exactly which code is being denied by them so it can be removed. Patient verbalized understanding and stated he will call.

## 2022-04-28 NOTE — Telephone Encounter (Signed)
Please refer to telephone note from 04/26/22 for more info.

## 2022-04-29 NOTE — Telephone Encounter (Signed)
LVM explaining to patient that we found the coding error and will be sending to billing for correction.

## 2022-05-02 ENCOUNTER — Ambulatory Visit (INDEPENDENT_AMBULATORY_CARE_PROVIDER_SITE_OTHER): Payer: Medicare Other

## 2022-05-02 ENCOUNTER — Ambulatory Visit (INDEPENDENT_AMBULATORY_CARE_PROVIDER_SITE_OTHER): Payer: Medicare Other | Admitting: Podiatry

## 2022-05-02 DIAGNOSIS — M2041 Other hammer toe(s) (acquired), right foot: Secondary | ICD-10-CM | POA: Diagnosis not present

## 2022-05-02 DIAGNOSIS — M2042 Other hammer toe(s) (acquired), left foot: Secondary | ICD-10-CM

## 2022-05-02 NOTE — Progress Notes (Signed)
Chief Complaint  Patient presents with   Hammer Toe    Rm 4 Right hammer toe pain and concern. Right great toe worse than others. Pt states he is here for a second opinion. Pt wants to know what can be done to relive the pain. Pt states other Dr. Lenon Ahmadi that surgery will make it worse.     HPI: 82 y.o. male presenting today as a new patient for evaluation of symptomatic mallet toe and hammertoe to the second digit of the right foot.  Patient states that he is very frustrated with the rubbing of his toes in his shoes.  He has tried different shoe gear modifications with no improvement.  He has seen other podiatry in the past and was unsatisfied with the care and advice.  He presents today for further treatment and evaluation  Past Medical History:  Diagnosis Date   Acquired clawfoot, right foot 12/16/2021   Anxiety    Atrophy of calf muscles 10/21/2021   Duplex calves was negative for significant blockage [facility] done at [facility] MRI lumbar spine was also done at [facility]   Carotid artery occlusion    COPD (chronic obstructive pulmonary disease) (HCC)    Depression    Dysrhythmia    PAF, atrial tachycardia   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   The spinal cord appears normal. 2.   No spinal stenosis. 3.   Mild multilevel degenerative changes as detailed above that do not lead to spinal stenosis or nerve root compression. 4.   T2 hyperintense foci within the pons consistent with chronic microvascular ischemic changes.   Loop - Medtronic Linq 10/22/2020 10/22/2020   Nocturia    Peripheral vascular disease (HCC)    Sleep apnea    on 6 cm, nasal pillow   Stroke ([place]) 05/01/2011   ischemic   Weakness generalized 10/21/2021   Severe, legs and arms    Past  Surgical History:  Procedure Laterality Date   CAROTID ENDARTERECTOMY  12/09/11   Left cea   ENDARTERECTOMY  12/09/2011   Procedure: ENDARTERECTOMY CAROTID;  Surgeon: Rosetta Posner, MD;  Location: [facility];  Service: Vascular;  Laterality: Left;   EYE SURGERY     rt retina detachment   HAMMER TOE SURGERY  2004   HERNIA REPAIR  2006   SHOULDER ARTHROSCOPY W/ ROTATOR CUFF REPAIR  2010   TONSILLECTOMY      Allergies  Allergen Reactions   Sulfamethoxazole-Trimethoprim Nausea And Vomiting     Physical Exam: General: The patient is alert and oriented x3 in no acute distress.  Dermatology: Skin is warm, dry and supple bilateral lower extremities. Negative for open lesions or macerations.  Vascular: Palpable pedal pulses bilaterally. Capillary refill within normal limits.  Negative for any significant edema or erythema  Neurological: Light touch and protective threshold grossly intact  Musculoskeletal Exam: Cavus foot type with extensor overcompensation.  He mallet toe and hammertoes most symptomatic on the second digit of the right foot.  These are reducible.  There is associated tenderness to the dorsum of the IPJ of the hallux as well as the second digit PIPJ.  Radiographic Exam B/L feet 05/02/2022:  Normal osseous mineralization. Joint spaces  preserved.  Cavus foot type with contracture of the MTP and PIP of the digits.  Mallet toe deformity also noted.  No acute fractures identified  Assessment: 1.  Symptomatic mallet toe and hammertoe second digit bilateral. RT > LT   Plan of Care:  1. Patient evaluated. X-Rays reviewed.  2.  Unfortunately the patient has not had any improvement with conservative modalities.  He continues to have pain and tenderness on a daily basis and he is very frustrated.  I do believe that this time surgery is warranted.  I do believe the best option for the patient would be an IPJ arthrodesis of the great toe with MTP capsulotomy and lengthening of the EHL tendon.   I do not believe a Ronnald Ramp tenosuspension is warranted at this time since he has no sub first MTP or sesamoidal pain.  Also PIPJ arthroplasty with MTP capsulotomy of the second digit with percutaneous pin fixation 3.  Risk benefits advantages and disadvantages as well as the postoperative recovery course were also explained in detail with the patient.  He understands that he will be WBAT in a cam boot for approximately 4 weeks.  Patient agrees and would like to proceed with surgery 4.  Authorization for surgery was initiated today.  Surgery will consist of IPJ arthrodesis with MTP capsulotomy right great toe.  PIPJ arthroplasty with MTP capsulotomy and percutaneous pin fixation right second digit. 5.  Cam boot dispensed for postop 6.  Return to clinic 1 week postop      Edrick Kins, DPM [facility]  Dr. Edrick Kins, DPM    [address], [place] [address]                Office [phone]  Fax [phone]

## 2022-05-03 ENCOUNTER — Ambulatory Visit: Payer: Medicare Other | Attending: Internal Medicine | Admitting: Physical Therapy

## 2022-05-03 ENCOUNTER — Encounter: Payer: Self-pay | Admitting: Physical Therapy

## 2022-05-03 DIAGNOSIS — R2689 Other abnormalities of gait and mobility: Secondary | ICD-10-CM | POA: Insufficient documentation

## 2022-05-03 DIAGNOSIS — M6249 Contracture of muscle, multiple sites: Secondary | ICD-10-CM | POA: Diagnosis present

## 2022-05-03 DIAGNOSIS — R296 Repeated falls: Secondary | ICD-10-CM | POA: Diagnosis present

## 2022-05-03 DIAGNOSIS — M5459 Other low back pain: Secondary | ICD-10-CM | POA: Diagnosis present

## 2022-05-03 NOTE — Therapy (Signed)
OUTPATIENT PHYSICAL THERAPY THORACOLUMBAR TREATMENT   Patient Name: Brandon Mendoza MRN: [id number] DOB:1940-09-10, 82 y.o., male Today's Date: 05/03/2022  END OF SESSION:  PT End of Session - 05/03/22 0851     Visit Number 18    Date for PT Re-Evaluation 05/18/22    Authorization Type [employer]    PT Start Time 0845    PT Stop Time 0930    PT Time Calculation (min) 45 min    Equipment Utilized During Treatment Gait belt    Activity Tolerance Patient tolerated treatment well    Behavior During Therapy WFL for tasks assessed/performed             Past Medical History:  Diagnosis Date   Acquired clawfoot, right foot 12/16/2021   Anxiety    Atrophy of calf muscles 10/21/2021   Duplex calves was negative for significant blockage EMG [facility] MRI lumbar spine was also done at [facility]   Carotid artery occlusion    COPD (chronic obstructive pulmonary disease) (HCC)    Depression    Dysrhythmia    PAF, atrial tachycardia   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   The spinal cord appears normal. 2.   No spinal stenosis. 3.   Mild multilevel degenerative changes as detailed above that do not lead to spinal stenosis or nerve root compression. 4.   T2 hyperintense foci within the pons consistent with chronic microvascular ischemic changes.   Loop - Medtronic Linq 10/22/2020 10/22/2020   Nocturia    Peripheral vascular disease (HCC)    Sleep apnea    on 6 cm, nasal pillow   Stroke ([place]) 05/01/2011   ischemic   Weakness generalized 10/21/2021   Severe, legs and arms   Past Surgical History:  Procedure Laterality Date   CAROTID ENDARTERECTOMY  12/09/11   Left cea   ENDARTERECTOMY  12/09/2011   Procedure: ENDARTERECTOMY CAROTID;  Surgeon: Rosetta Posner, MD;  Location: [facility];  Service: Vascular;  Laterality: Left;   EYE SURGERY     rt retina detachment   HAMMER TOE SURGERY  2004   HERNIA REPAIR  2006   SHOULDER ARTHROSCOPY W/ ROTATOR CUFF REPAIR  2010   TONSILLECTOMY     Patient Active Problem List   Diagnosis Date Noted   Macular degeneration 03/01/2022   Myofascial pain 02/10/2022   Lumbar spondylosis 01/03/2022   Acquired clawfoot, left foot 12/16/2021   Acquired clawfoot, right foot 12/16/2021   Claw foot 12/14/2021   Gait abnormality 12/10/2021   Spinal stenosis at L4-L5 level 12/10/2021   Ataxia 11/22/2021   Lightheadedness 11/02/2021   Claudication of lower extremity ([place]) 10/21/2021   Atrophy of calf muscles 10/21/2021   Weakness generalized 10/21/2021   Focal neurological deficit 10/21/2021   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   Pain in joint of right hip 09/14/2021   High arches 07/22/2021   Hammertoes of both feet 07/22/2021   Neuropathy 07/22/2021   Constipation 05/28/2021   Disequilibrium 05/28/2021   Personal history of colonic polyps 05/28/2021   Renal cyst 05/28/2021   Gastroesophageal reflux disease without esophagitis 05/05/2021   Ingrowing left great toenail 02/15/2021  S/P lumbar fusion 12/24/2020   Spinal stenosis of lumbar region 12/04/2020   Acquired complex renal cyst 11/23/2020   Functional gait abnormality 11/04/2020   Idiopathic neuropathy 11/02/2020   Tinea pedis of both feet 10/28/2020   Loop - Medtronic Linq 10/22/2020 10/22/2020   Benign hypertension with CKD (chronic kidney disease) stage III ([employer]) 10/05/2020   Fall with injury 10/05/2020   Hematoma 09/16/2020   Normocytic anemia 09/14/2020   AF (paroxysmal atrial fibrillation) ([place]) 09/14/2020   Chronic anticoagulation 09/14/2020   Hyponatremia 09/14/2020   Pain in left foot 01/22/2020   Acquired thrombophilia ([place]) 12/30/2019   Pain in joint of right knee 12/27/2019   Recurrent falls  11/17/2019   Paroxysmal atrial fibrillation ([place]) 10/22/2019   Pain in right foot 10/14/2019   Falls 10/07/2019   Ataxia involving legs 08/12/2019   OSA on CPAP 06/20/2018   Urge incontinence 04/13/2018   Urinary urgency 04/13/2018   Pain in left knee 03/01/2018   Other intervertebral disc degeneration, lumbar region 06/16/2017   Acquired bilateral hammer toes 10/05/2015   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   Generalized anxiety disorder 05/12/2015   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[place]) 05/10/2015   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[place]) 04/10/2014   History of left-sided carotid endarterectomy 04/10/2014   Essential hypertension 04/10/2014   Occlusion and stenosis of carotid artery without mention of cerebral infarction 12/15/2011   Habitual alcohol use 12/01/2011   Carotid artery stenosis, symptomatic 11/30/2011   Cerebral infarction ([place]) 11/29/2011   Hyperlipidemia     PCP: Berniece Pap  REFERRING PROVIDER: Berniece Pap  REFERRING DIAG: M54.42,M54.41,G89.29   Rationale for Evaluation and Treatment: Rehabilitation  THERAPY DIAG:  Repeated falls  Other low back pain  Other abnormalities of gait and mobility  Contracture of muscle, multiple sites  ONSET DATE: 01/25/22  SUBJECTIVE:                                                                                                                                                                                           SUBJECTIVE STATEMENT: No falls, doing okay  PERTINENT HISTORY:  Cerebral infarct 10/21/21, lumbar fusion, recurrent falls   PAIN:  Are you having pain? Yes: NPRS scale: 0/10 Pain location: low back Pain description: ache  Aggravating factors: being on my feet  Relieving factors: movement  PRECAUTIONS: Fall  WEIGHT BEARING RESTRICTIONS: No  FALLS:  Has patient fallen in last 6 months? Yes. Number of falls every few days   LIVING  ENVIRONMENT: Lives with: lives with their spouse Lives in: House/apartment Stairs: Yes: Internal: 20 steps; on right going up  Has following equipment at home: Dan Humphreys - 4 wheeled and trekking pole  OCCUPATION: Retired  PLOF: Independent  PATIENT GOALS: to not fall and go deer hunting again  NEXT MD VISIT:   OBJECTIVE:   DIAGNOSTIC FINDINGS:    LUMBAR SPINE FINDINGS: 1. Wide laminectomy and posterior pedicle screw and rod fixation at L4-5 without residual or recurrent stenosis. 2. Progressive adjacent level disease at L3-4 with moderate right and mild left subarticular narrowing and moderate foraminal stenosis bilaterally. Progression is predominantly due to facet disease 3. Progressive moderate facet hypertrophy at L1-2 with a progressive rightward disc protrusion resulting in progressive moderate right foraminal stenosis. Asymmetric endplate marrow edema on the right at L1-2 is consistent with progressive disease as well. 4. Otherwise stable degenerative change without other focal stenosis.   THORACIC SPINE FINDINGS:  On sagittal views the vertebral bodies have normal height and alignment.  Hemangioma within T9 vertebral body and slightly within T10 vertebral body.  Anterior kyphosis.  Mild scoliosis convex right centered at T8 level. The spinal cord is normal in size and appearance. The paraspinal soft tissues are unremarkable.     On axial views there is no spinal stenosis or foraminal narrowing.  Limited views of the aorta, kidneys, liver, lungs and paraspinal muscles are notable for multiple large right renal cysts ranging in size from 1.5 to 7 cm.   COGNITION: Overall cognitive status: Within functional limits for tasks assessed     SENSATION: WFL  MUSCLE LENGTH: Hamstrings: mod tightness on both sides  POSTURE: rounded shoulders and forward head  LUMBAR ROM:   AROM eval  Flexion Limited due to HS tightness  Extension WFL  Right lateral flexion Tightness in  R side  Left lateral flexion WFL  Right rotation WFL  Left rotation Tightness on R side   (Blank rows = not tested)  LOWER EXTREMITY ROM:   grossly WFL    LOWER EXTREMITY MMT:  grossly 4+/5   FUNCTIONAL TESTS:  5 times sit to stand: 20s Timed up and go (TUG): 16s   04/15/22:  3 minute walk test:  had to sit and rest at 45 seconds and went 210 feet GAIT: Distance walked: in clinic distances Assistive device utilized:  trekking pole Level of assistance: SBA Comments: ataxic gait pattern, increased lateral sway, decreased safety awareness   TODAY'S TREATMENT:                                                                                                                              DATE:  05/03/22 Nustep level 5 x 6 minutes Minitramp bounce and marching Pball rollouts for lumbar stretches Sidestepping in pbars on airex balance beam 10# sit to stand with OHP Volleyball Leg press 40# x10, then single legs 40# 2x10 15# straight arm pulls 15# AR press Sit to stand on airex 5# hip abduction and extension Partial situp/crunches  04/27/22 NuStep L5 x56mins  Leg ext 35# 2x10 HS curls  45# 2x10 STS on airex 2x10 Standing on airex playing catch- one LOB backwards onto mat table 45# seated rows 2x10 35# lats 2x10 Leg press 60# 2x10 Shoulder ext 5# 2x10 Box taps 4" Side steps on beam  04/26/22 Nustep level 5 x 6 mintues Leg extension and abduction 5# pulleys 2x10 CGA Supine feet on ball K2C, trunk rotation, small bridges and isometric abs Passive LE stretches Quadraped hip extension and then alternating arms and leg lifts with CGA due to poor balance Ambulated with him out the back door through the grass and uneven terrain to his car with CGA, negotiated a curb  04/20/22 Elliptical level 1 x 2 minutes Bike Level 5 x 5 mintues with power burst every minute Tmill 20 seconds push and pull x 3 each CGA/min A Cone toe touches and hand touches Volleyball 15# straight arm  pulls CGA for balance 45# seated rows 35# lats 25# chest press all 2x10 6" toe touches, and step ups 20# bow pulls On mat walking figure 8's  04/18/22 Nustep level 5 x 6 minutes Tmill push and pull 20 seconds 3 x each needed Max A for the push backward Side stepping on the airx balance beam Side stepping in pbars without holding 12" toe touch on and off airex with SPC  Side step on and off airex In corner on airex head turns , eyes ope and closed 4" step up and over  04/15/22 Nustep level 5 x 6 minutes In pbars side stepping and back ward walking 6" toe touches Butt touches and shoulder blade touches working on balance strategies On airex in the corner reaching for cones Solid surface ball toss Walking ball toss 3 minute walk test made it to 45 seconds and 210 feet Resisted gait 30# all direction  04/12/22 Bike Level 4 x 6 minutes Worked on stairs with one handrail Cone toe touches in P bars 4" and 6" toe touches with and without hand hold Worked on hip, ankle and step strategies for balance Backward walking in pbars Side step over bar Stepping over objects using cane Cone hand touches and having him come all the way up  04/07/22 Bike L3.5 x 6 min Alt 4 in box taps w/ Trekking pole 2x10 S2S LE on airex 2x10 elevated mat  Step ups on airex with trekking pole 2x5 each Leg press 70lb 2x12, Heel raises 40lb 3x15  Seated Rows and lats 35lb 3x12 Chest press 20lb 2x12 Shoulder Ext 10lb 2x10   PATIENT EDUCATION:  Education details: HEP and POC Person educated: Patient Education method: Explanation Education comprehension: verbalized understanding  HOME EXERCISE PROGRAM: Marching, 3 way leg kicks, mini squats, heel raises, STS from chair   ASSESSMENT:  CLINICAL IMPRESSION: We continue to push his strength for LE's and core and his balance, did very well with the sit to stand with 10# OHP, the hip abduction with the 5# on the pulleys really causes problems as  his knees seem to buckle.  He does fatigue quickly  OBJECTIVE IMPAIRMENTS: Abnormal gait, decreased balance, decreased coordination, and difficulty walking.   ACTIVITY LIMITATIONS: squatting, stairs, transfers, and locomotion level  REHAB POTENTIAL: Fair dependent on carry over and patient compliance  CLINICAL DECISION MAKING: Stable/uncomplicated  EVALUATION COMPLEXITY: Low  GOALS: Goals reviewed with patient? Yes  SHORT TERM GOALS: Target date: 04/06/22  Patient will be independent with initial HEP. Goal status: Met 03/07/22  2.  Patient will be educated on strategies to decrease risk of falls.  Goal  status: 03/07/22   LONG TERM GOALS: Target date: 05/18/22  Patient will be independent with advanced/ongoing HEP to improve outcomes and carryover.  Goal status: Met  2.  Patient will demonstrate decreased fall risk by scoring < 12 sec on TUG. Baseline: 16s Goal status: Progressing 04/05/22 - 12.25 sec  3.  Patient will demonstrate improved functional LE strength as demonstrated by < 15s on 5xSTS. Baseline: 20s Goal status: Met 11.09 03/25/22  4.  Patient will score 42 on Berg Balance test to demonstrate lower risk of falls. (MCID= 8 points) .  Baseline: 36/56 Goal status: Met 42/56 04/05/22  5.  Patient will report no falls in a 4 weeks period. Baseline: falls every few days Goal status: On going had a fall week of 04/18/22  PLAN:  PT FREQUENCY: 2x/week  PT DURATION: 12 weeks  PLANNED INTERVENTIONS: Therapeutic exercises, Therapeutic activity, Neuromuscular re-education, Balance training, Gait training, Patient/Family education, Self Care, Joint mobilization, Stair training, Cryotherapy, Moist heat, and Manual therapy.  PLAN FOR NEXT SESSION: work and focus on balance strategies   Sumner Boast, PT 05/03/2022, 8:51 AM

## 2022-05-03 NOTE — Telephone Encounter (Signed)
Noted thanks.

## 2022-05-05 ENCOUNTER — Ambulatory Visit: Payer: Medicare Other | Admitting: Physical Therapy

## 2022-05-05 ENCOUNTER — Ambulatory Visit (INDEPENDENT_AMBULATORY_CARE_PROVIDER_SITE_OTHER): Payer: Medicare Other

## 2022-05-05 ENCOUNTER — Encounter: Payer: Self-pay | Admitting: Urgent Care

## 2022-05-05 ENCOUNTER — Ambulatory Visit
Admission: EM | Admit: 2022-05-05 | Discharge: 2022-05-05 | Disposition: A | Discharge status: Home / Self Care | Payer: Medicare Other | Attending: Urgent Care | Admitting: Urgent Care

## 2022-05-05 DIAGNOSIS — R053 Chronic cough: Secondary | ICD-10-CM | POA: Diagnosis not present

## 2022-05-05 DIAGNOSIS — R079 Chest pain, unspecified: Secondary | ICD-10-CM | POA: Diagnosis not present

## 2022-05-05 DIAGNOSIS — R0789 Other chest pain: Secondary | ICD-10-CM

## 2022-05-05 DIAGNOSIS — Z87891 Personal history of nicotine dependence: Secondary | ICD-10-CM

## 2022-05-05 MED ORDER — ALBUTEROL SULFATE HFA 108 (90 BASE) MCG/ACT IN AERS: 1.0000 | INHALATION_SPRAY | Freq: Four times a day (QID) | RESPIRATORY_TRACT | 0 refills | Status: DC | PRN

## 2022-05-05 NOTE — ED Provider Notes (Signed)
[facility]  Note:  This document was prepared using Systems analyst and may include unintentional dictation errors.  MRN: [id number] DOB: 10-07-1940  Subjective:   Brandon Mendoza is a 82 y.o. male presenting for 1-month history of persistent chronic cough, intermittent chest tightness.  No fevers, chest pain, shortness of breath or wheezing, weight loss.  Patient is a former smoker. Quit ~10-15 years ago. Has a 50 pack year history of smoking.  His medical chart shows a history of COPD but patient insists he is never had this problem.  Does not use any kind of inhalers.  No current facility-administered medications for this encounter.  Current Outpatient Medications:    ALPRAZolam (XANAX) 0.25 MG tablet, 1 tablet (0.25 mg total) by Per NG tube route daily as needed for anxiety., Disp: 90 tablet, Rfl: 0   aspirin 81 MG chewable tablet, Chew 1 tablet by mouth daily., Disp: , Rfl:    DULoxetine (CYMBALTA) 30 MG capsule, Take 30 mg by mouth daily., Disp: , Rfl:    flecainide (TAMBOCOR) 100 MG tablet, Take 1 tablet (100 mg total) by mouth 2 (two) times daily., Disp: 180 tablet, Rfl: 1   losartan-hydrochlorothiazide (HYZAAR) 50-12.5 MG tablet, Take 1 tablet by mouth every morning., Disp: 90 tablet, Rfl: 3   meclizine (ANTIVERT) 12.5 MG tablet, as needed. As needed., Disp: , Rfl:    meloxicam (MOBIC) 15 MG tablet, Take 15 mg by mouth daily as needed. As needed., Disp: , Rfl:    MULTIPLE VITAMIN PO, Take 1 tablet by mouth daily., Disp: , Rfl:    rosuvastatin (CRESTOR) 20 MG tablet, Take 0.5 tablets (10 mg total) by mouth at bedtime., Disp: 90 tablet, Rfl: 3   sertraline (ZOLOFT) 50 MG tablet, Take 1 tablet (50 mg total) by mouth daily. Use to taper off sertraline slowly over several months, if tolerated. Be aware anxiety and irritability may increase and you might have to stay on this, Disp: 90 tablet, Rfl: 3    Allergies  Allergen Reactions    Sulfamethoxazole-Trimethoprim Nausea And Vomiting    Past Medical History:  Diagnosis Date   Acquired clawfoot, right foot 12/16/2021   Anxiety    Atrophy of calf muscles 10/21/2021   Duplex calves was negative for significant blockage EMG [facility] done at [facility] MRI lumbar spine was also done at [facility]   Carotid artery occlusion    COPD (chronic obstructive pulmonary disease) (HCC)    Depression    Dysrhythmia    PAF, atrial tachycardia   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   The spinal cord appears normal. 2.   No spinal stenosis. 3.   Mild multilevel degenerative changes as detailed above that do not lead to spinal stenosis or nerve root compression. 4.   T2 hyperintense foci within the pons consistent with chronic microvascular ischemic changes.   Loop - Medtronic Linq 10/22/2020 10/22/2020   Nocturia    Peripheral vascular disease (HCC)    Sleep apnea    on 6 cm, nasal pillow   Stroke ([place]) 05/01/2011   ischemic   Weakness generalized 10/21/2021   Severe, legs and arms     Past Surgical History:  Procedure Laterality Date   CAROTID ENDARTERECTOMY  12/09/11   Left cea   ENDARTERECTOMY  12/09/2011   Procedure: ENDARTERECTOMY CAROTID;  Surgeon: Rosetta Posner, MD;  Location: [facility];  Service: Vascular;  Laterality: Left;   EYE SURGERY     rt retina detachment   HAMMER TOE SURGERY  2004   HERNIA REPAIR  2006   SHOULDER ARTHROSCOPY W/ ROTATOR CUFF REPAIR  2010   TONSILLECTOMY      Family History  Problem Relation Age of Onset   Alzheimer's disease Father    Diabetes Other     Social History   Tobacco Use   Smoking status: Former    Packs/day: 1.00    Years: 50.00    Total pack years: 50.00    Types: Cigarettes    Quit date: 08/26/2004     Years since quitting: 17.7    Passive exposure: Never   Smokeless tobacco: Never   Tobacco comments:    quit smoking 08/2004  Vaping Use   Vaping Use: Never used  Substance Use Topics   Alcohol use: Not Currently    Alcohol/week: 7.0 standard drinks of alcohol    Types: 7 Standard drinks or equivalent per week    Comment: 2-3 daily cocktails   Drug use: No    ROS   Objective:   Vitals: BP 138/78 (BP Location: Right Arm)   Pulse 63   Temp 98.9 F (37.2 C) (Oral)   Resp 20   SpO2 91%   Physical Exam Constitutional:      General: He is not in acute distress.    Appearance: Normal appearance. He is well-developed. He is not ill-appearing, toxic-appearing or diaphoretic.  HENT:     Head: Normocephalic and atraumatic.     Right Ear: External ear normal.     Left Ear: External ear normal.     Nose: Nose normal.     Mouth/Throat:     Mouth: Mucous membranes are moist.  Eyes:     General: No scleral icterus.       Right eye: No discharge.        Left eye: No discharge.     Extraocular Movements: Extraocular movements intact.  Cardiovascular:     Rate and Rhythm: Normal rate and regular rhythm.     Heart sounds: Normal heart sounds. No murmur heard.    No friction rub. No gallop.  Pulmonary:     Effort: Pulmonary effort is normal. No respiratory distress.     Breath sounds: Normal breath sounds. No stridor. No wheezing, rhonchi or rales.  Neurological:     Mental Status: He is alert and oriented to person, place, and time.  Psychiatric:        Mood and Affect: Mood normal.        Behavior: Behavior normal.        Thought Content: Thought content normal.    DG Chest 2 View  Result Date: 05/05/2022 CLINICAL DATA:  Chronic cough, chest tightness for 5 months EXAM: CHEST - 2 VIEW COMPARISON:  08/15/2018 FINDINGS: Frontal and lateral views of the chest demonstrate loop recorder left anterior chest wall. Cardiac silhouette is unremarkable. No acute airspace disease,  effusion, or pneumothorax. Lungs are hyperinflated. No acute bony abnormalities. IMPRESSION: 1. Stable chest, no acute process. Electronically Signed   By: Randa Ngo M.D.   On: 05/05/2022 15:05    Assessment and Plan :   PDMP not reviewed this encounter.  1. Chronic cough  2. Chest tightness   3. Former smoker     I suspect the patient does have underlying mild COPD secondary to his extensive smoking.  He has quit for a while now.  However his chest x-ray is negative and therefore we will hold off on using antibiotics.  Recommended albuterol as needed.  Follow-up with PCP as soon as possible. Counseled patient on potential for adverse effects with medications prescribed/recommended today, ER and return-to-clinic precautions discussed, patient verbalized understanding.    Jaynee Eagles, [place] 05/05/22 [id number]

## 2022-05-05 NOTE — ED Triage Notes (Signed)
Pt c/o prod cough, chest tightness x 5 months-NAD-steady gait with own cane

## 2022-05-09 ENCOUNTER — Ambulatory Visit: Payer: Medicare Other | Admitting: Physical Therapy

## 2022-05-10 ENCOUNTER — Ambulatory Visit: Payer: Medicare Other | Admitting: Physical Therapy

## 2022-05-10 ENCOUNTER — Encounter: Payer: Self-pay | Admitting: Physical Therapy

## 2022-05-10 DIAGNOSIS — M5459 Other low back pain: Secondary | ICD-10-CM

## 2022-05-10 DIAGNOSIS — M6249 Contracture of muscle, multiple sites: Secondary | ICD-10-CM

## 2022-05-10 DIAGNOSIS — R296 Repeated falls: Secondary | ICD-10-CM | POA: Diagnosis not present

## 2022-05-10 DIAGNOSIS — R2689 Other abnormalities of gait and mobility: Secondary | ICD-10-CM

## 2022-05-10 NOTE — Therapy (Signed)
OUTPATIENT PHYSICAL THERAPY THORACOLUMBAR TREATMENT   Patient Name: Brandon Mendoza MRN: [id number] DOB:14-Nov-1940, 82 y.o., male Today's Date: 05/10/2022  END OF SESSION:  PT End of Session - 05/10/22 0933     Visit Number 19    Date for PT Re-Evaluation 05/18/22    Authorization Type [employer]    PT Start Time 0927    PT Stop Time 1014    PT Time Calculation (min) 47 min    Activity Tolerance Patient tolerated treatment well    Behavior During Therapy [facility] for tasks assessed/performed             Past Medical History:  Diagnosis Date   Acquired clawfoot, right foot 12/16/2021   Anxiety    Atrophy of calf muscles 10/21/2021   Duplex calves was negative for significant blockage EMG was done at [facility] MRI lumbar spine was also done at [facility]   Carotid artery occlusion    COPD (chronic obstructive pulmonary disease) (HCC)    Depression    Dysrhythmia    PAF, atrial tachycardia   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   The spinal cord appears normal. 2.   No spinal stenosis. 3.   Mild multilevel degenerative changes as detailed above that do not lead to spinal stenosis or nerve root compression. 4.   T2 hyperintense foci within the pons consistent with chronic microvascular ischemic changes.   Loop - Medtronic Linq 10/22/2020 10/22/2020   Nocturia    Peripheral vascular disease (HCC)    Sleep apnea    on 6 cm, nasal pillow   Stroke ([place]) 05/01/2011   ischemic   Weakness generalized 10/21/2021   Severe, legs and arms   Past Surgical History:  Procedure Laterality Date   CAROTID ENDARTERECTOMY  12/09/11   Left cea   ENDARTERECTOMY  12/09/2011   Procedure: ENDARTERECTOMY CAROTID;  Surgeon: Rosetta Posner, MD;  Location: [facility];  Service: Vascular;   Laterality: Left;   EYE SURGERY     rt retina detachment   HAMMER TOE SURGERY  2004   HERNIA REPAIR  2006   SHOULDER ARTHROSCOPY W/ ROTATOR CUFF REPAIR  2010   TONSILLECTOMY     Patient Active Problem List   Diagnosis Date Noted   Macular degeneration 03/01/2022   Myofascial pain 02/10/2022   Lumbar spondylosis 01/03/2022   Acquired clawfoot, left foot 12/16/2021   Acquired clawfoot, right foot 12/16/2021   Claw foot 12/14/2021   Gait abnormality 12/10/2021   Spinal stenosis at L4-L5 level 12/10/2021   Ataxia 11/22/2021   Lightheadedness 11/02/2021   Claudication of lower extremity ([place]) 10/21/2021   Atrophy of calf muscles 10/21/2021   Weakness generalized 10/21/2021   Focal neurological deficit 10/21/2021   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   Pain in joint of right hip 09/14/2021   High arches 07/22/2021   Hammertoes of both feet 07/22/2021   Neuropathy 07/22/2021   Constipation 05/28/2021   Disequilibrium 05/28/2021   Personal history of colonic polyps 05/28/2021   Renal cyst 05/28/2021   Gastroesophageal reflux disease without esophagitis 05/05/2021   Ingrowing left great toenail 02/15/2021   S/P lumbar fusion 12/24/2020   Spinal stenosis of  lumbar region 12/04/2020   Acquired complex renal cyst 11/23/2020   Functional gait abnormality 11/04/2020   Idiopathic neuropathy 11/02/2020   Tinea pedis of both feet 10/28/2020   Loop - Medtronic Linq 10/22/2020 10/22/2020   Benign hypertension with CKD (chronic kidney disease) stage III ([place]) 10/05/2020   Fall with injury 10/05/2020   Hematoma 09/16/2020   Normocytic anemia 09/14/2020   AF (paroxysmal atrial fibrillation) ([place]) 09/14/2020   Chronic anticoagulation 09/14/2020   Hyponatremia 09/14/2020   Pain in left foot 01/22/2020   Acquired thrombophilia ([place]) 12/30/2019   Pain in joint of right knee 12/27/2019   Recurrent falls 11/17/2019   Paroxysmal atrial fibrillation ([place])  10/22/2019   Pain in right foot 10/14/2019   Falls 10/07/2019   Ataxia involving legs 08/12/2019   OSA on CPAP 06/20/2018   Urge incontinence 04/13/2018   Urinary urgency 04/13/2018   Pain in left knee 03/01/2018   Other intervertebral disc degeneration, lumbar region 06/16/2017   Acquired bilateral hammer toes 10/05/2015   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   Generalized anxiety disorder 05/12/2015   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[place]) 05/10/2015   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[place]) 04/10/2014   History of left-sided carotid endarterectomy 04/10/2014   Essential hypertension 04/10/2014   Occlusion and stenosis of carotid artery without mention of cerebral infarction 12/15/2011   Habitual alcohol use 12/01/2011   Carotid artery stenosis, symptomatic 11/30/2011   Cerebral infarction ([place]) 11/29/2011   Hyperlipidemia     PCP: Berniece Pap  REFERRING PROVIDER: Berniece Pap  REFERRING DIAG: M54.42,M54.41,G89.29   Rationale for Evaluation and Treatment: Rehabilitation  THERAPY DIAG:  Repeated falls  Other low back pain  Other abnormalities of gait and mobility  Contracture of muscle, multiple sites  ONSET DATE: 01/25/22  SUBJECTIVE:                                                                                                                                                                                           SUBJECTIVE STATEMENT: Patient reports that he was very tired and weak after the last visit, cancelled one visit due to this, he does report a fall on the stairs over the weekend.  He also reports that he got new glasses as well about a week ago and he is not happy with them and his double vision  PERTINENT HISTORY:  Cerebral infarct 10/21/21, lumbar fusion, recurrent falls   PAIN:  Are you having pain? Yes: NPRS scale: 0/10 Pain location: low back Pain description: ache  Aggravating factors: being on my  feet  Relieving factors: movement  PRECAUTIONS: Fall  WEIGHT BEARING RESTRICTIONS: No  FALLS:  Has patient fallen in last 6 months? Yes. Number of falls every few days   LIVING ENVIRONMENT: Lives with: lives with their spouse Lives in: House/apartment Stairs: Yes: Internal: 20 steps; on right going up Has following equipment at home: Walker - 4 wheeled and trekking pole  OCCUPATION: Retired  PLOF: Independent  PATIENT GOALS: to not fall and go deer hunting again  NEXT MD VISIT:   OBJECTIVE:   DIAGNOSTIC FINDINGS:    LUMBAR SPINE FINDINGS: 1. Wide laminectomy and posterior pedicle screw and rod fixation at L4-5 without residual or recurrent stenosis. 2. Progressive adjacent level disease at L3-4 with moderate right and mild left subarticular narrowing and moderate foraminal stenosis bilaterally. Progression is predominantly due to facet disease 3. Progressive moderate facet hypertrophy at L1-2 with a progressive rightward disc protrusion resulting in progressive moderate right foraminal stenosis. Asymmetric endplate marrow edema on the right at L1-2 is consistent with progressive disease as well. 4. Otherwise stable degenerative change without other focal stenosis.   THORACIC SPINE FINDINGS:  On sagittal views the vertebral bodies have normal height and alignment.  Hemangioma within T9 vertebral body and slightly within T10 vertebral body.  Anterior kyphosis.  Mild scoliosis convex right centered at T8 level. The spinal cord is normal in size and appearance. The paraspinal soft tissues are unremarkable.     On axial views there is no spinal stenosis or foraminal narrowing.  Limited views of the aorta, kidneys, liver, lungs and paraspinal muscles are notable for multiple large right renal cysts ranging in size from 1.5 to 7 cm.   COGNITION: Overall cognitive status: Within functional limits for tasks assessed     SENSATION: WFL  MUSCLE LENGTH: Hamstrings: mod  tightness on both sides  POSTURE: rounded shoulders and forward head  LUMBAR ROM:   AROM eval  Flexion Limited due to HS tightness  Extension WFL  Right lateral flexion Tightness in R side  Left lateral flexion WFL  Right rotation WFL  Left rotation Tightness on R side   (Blank rows = not tested)  LOWER EXTREMITY ROM:   grossly WFL    LOWER EXTREMITY MMT:  grossly 4+/5   FUNCTIONAL TESTS:  5 times sit to stand: 20s Timed up and go (TUG): 16s   04/15/22:  3 minute walk test:  had to sit and rest at 2mnute 45 seconds and went 210 feet GAIT: Distance walked: in clinic distances Assistive device utilized:  trekking pole Level of assistance: SBA Comments: ataxic gait pattern, increased lateral sway, decreased safety awareness   TODAY'S TREATMENT:                                                                                                                              DATE:  05/10/22 Nustep level 5 x 6 minutes In pbars side stepping on the airex balance beam needed to hold on today Fee on ball K2C, trunk rotation,  small bridge and isometric abs LE stretches Ball b/n knees bridges Clamshell bridges Quadraped bird dogs Tmill 3 minutes 1.0 mph 10# straight arm pulls 15# chest press 2x15   05/03/22 Nustep level 5 x 6 minutes Minitramp bounce and marching Pball rollouts for lumbar stretches Sidestepping in pbars on airex balance beam 10# sit to stand with OHP Volleyball Leg press 40# x10, then single legs 40# 2x10 15# straight arm pulls 15# AR press Sit to stand on airex 5# hip abduction and extension Partial situp/crunches  04/27/22 NuStep L5 x63mns  Leg ext 35# 2x10 HS curls 45# 2x10 STS on airex 2x10 Standing on airex playing catch- one LOB backwards onto mat table 45# seated rows 2x10 35# lats 2x10 Leg press 60# 2x10 Shoulder ext 5# 2x10 Box taps 4" Side steps on beam  04/26/22 Nustep level 5 x 6 mintues Leg extension and abduction 5# pulleys 2x10  CGA Supine feet on ball K2C, trunk rotation, small bridges and isometric abs Passive LE stretches Quadraped hip extension and then alternating arms and leg lifts with CGA due to poor balance Ambulated with him out the back door through the grass and uneven terrain to his car with CGA, negotiated a curb  04/20/22 Elliptical level 1 x 2 minutes Bike Level 5 x 5 mintues with power burst every minute Tmill 20 seconds push and pull x 3 each CGA/min A Cone toe touches and hand touches Volleyball 15# straight arm pulls CGA for balance 45# seated rows 35# lats 25# chest press all 2x10 6" toe touches, and step ups 20# bow pulls On mat walking figure 8's  04/18/22 Nustep level 5 x 6 minutes Tmill push and pull 20 seconds 3 x each needed Max A for the push backward Side stepping on the airx balance beam Side stepping in pbars without holding 12" toe touch on and off airex with SPC  Side step on and off airex In corner on airex head turns , eyes ope and closed 4" step up and over  04/15/22 Nustep level 5 x 6 minutes In pbars side stepping and back ward walking 6" toe touches Butt touches and shoulder blade touches working on balance strategies On airex in the corner reaching for cones Solid surface ball toss Walking ball toss 3 minute walk test made it to 146mute 45 seconds and 210 feet Resisted gait 30# all direction  04/12/22 Bike Level 4 x 6 minutes Worked on stairs with one handrail Cone toe touches in P bars 4" and 6" toe touches with and without hand hold Worked on hip, ankle and step strategies for balance Backward walking in pbars Side step over bar Stepping over objects using cane Cone hand touches and having him come all the way up  04/07/22 Bike L3.5 x 6 min Alt 4 in box taps w/ Trekking pole 2x10 S2S LE on airex 2x10 elevated mat  Step ups on airex with trekking pole 2x5 each Leg press 70lb 2x12, Heel raises 40lb 3x15  Seated Rows and lats 35lb 3x12 Chest press  20lb 2x12 Shoulder Ext 10lb 2x10   PATIENT EDUCATION:  Education details: HEP and POC Person educated: Patient Education method: Explanation Education comprehension: verbalized understanding  HOME EXERCISE PROGRAM: Marching, 3 way leg kicks, mini squats, heel raises, STS from chair   ASSESSMENT:  CLINICAL IMPRESSION: Patient was very discouraged reports that after the last visit he was "wiped out" did not go to the gym and had a fall trying to go up stairs on  Saturday, he does report getting new glasses a week ago as well.  Last session that I saw him he actually did the best he has done and needed only supervision to SBA with all activities, there were times a month ago he was tending to fall backwards with all activities and needed mod/max A to stop a fall, not sure if we over did it with the single leg leg press last visit  OBJECTIVE IMPAIRMENTS: Abnormal gait, decreased balance, decreased coordination, and difficulty walking.   ACTIVITY LIMITATIONS: squatting, stairs, transfers, and locomotion level  REHAB POTENTIAL: Fair dependent on carry over and patient compliance  CLINICAL DECISION MAKING: Stable/uncomplicated  EVALUATION COMPLEXITY: Low  GOALS: Goals reviewed with patient? Yes  SHORT TERM GOALS: Target date: 04/06/22  Patient will be independent with initial HEP. Goal status: Met 03/07/22  2.  Patient will be educated on strategies to decrease risk of falls.  Goal status: 03/07/22   LONG TERM GOALS: Target date: 05/18/22  Patient will be independent with advanced/ongoing HEP to improve outcomes and carryover.  Goal status: Met  2.  Patient will demonstrate decreased fall risk by scoring < 12 sec on TUG. Baseline: 16s Goal status: Progressing 04/05/22 - 12.25 sec  3.  Patient will demonstrate improved functional LE strength as demonstrated by < 15s on 5xSTS. Baseline: 20s Goal status: Met 11.09 03/25/22  4.  Patient will score 42 on Berg Balance test to  demonstrate lower risk of falls. (MCID= 8 points) .  Baseline: 36/56 Goal status: Met 42/56 04/05/22  5.  Patient will report no falls in a 4 weeks period. Baseline: falls every few days Goal status: On going had a fall week of 04/18/22  PLAN:  PT FREQUENCY: 2x/week  PT DURATION: 12 weeks  PLANNED INTERVENTIONS: Therapeutic exercises, Therapeutic activity, Neuromuscular re-education, Balance training, Gait training, Patient/Family education, Self Care, Joint mobilization, Stair training, Cryotherapy, Moist heat, and Manual therapy.  PLAN FOR NEXT SESSION: work and focus on balance strategies   Sumner Boast, PT 05/10/2022, 9:33 AM

## 2022-05-11 ENCOUNTER — Ambulatory Visit: Payer: Medicare Other | Admitting: Physical Therapy

## 2022-05-11 NOTE — Therapy (Signed)
OUTPATIENT PHYSICAL THERAPY THORACOLUMBAR TREATMENT  Progress Note Reporting Period 04/07/22 to 05/12/22  See note below for Objective Data and Assessment of Progress/Goals.     Patient Name: Brandon Mendoza MRN: [id number] DOB:05-06-1940, 82 y.o., male Today's Date: 05/12/2022  END OF SESSION:  PT End of Session - 05/12/22 0928     Visit Number 20    Date for PT Re-Evaluation 05/18/22    Authorization Type [employer]    PT Start Time 0930    PT Stop Time 1015    PT Time Calculation (min) 45 min    Activity Tolerance Patient tolerated treatment well    Behavior During Therapy [facility] for tasks assessed/performed             Past Medical History:  Diagnosis Date   Acquired clawfoot, right foot 12/16/2021   Anxiety    Atrophy of calf muscles 10/21/2021   Duplex calves was negative for significant blockage EMG was done at [facility] MRI lumbar spine was also done at [facility]   Carotid artery occlusion    COPD (chronic obstructive pulmonary disease) (HCC)    Depression    Dysrhythmia    PAF, atrial tachycardia   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   The spinal cord appears normal. 2.   No spinal stenosis. 3.   Mild multilevel degenerative changes as detailed above that do not lead to spinal stenosis or nerve root compression. 4.   T2 hyperintense foci within the pons consistent with chronic microvascular ischemic changes.   Loop - Medtronic Linq 10/22/2020 10/22/2020   Nocturia    Peripheral vascular disease (HCC)    Sleep apnea    on 6 cm, nasal pillow   Stroke ([place]) 05/01/2011   ischemic   Weakness generalized 10/21/2021   Severe, legs and arms   Past Surgical History:  Procedure Laterality Date   CAROTID ENDARTERECTOMY  12/09/11   Left cea    ENDARTERECTOMY  12/09/2011   Procedure: ENDARTERECTOMY CAROTID;  Surgeon: Rosetta Posner, MD;  Location: [facility];  Service: Vascular;  Laterality: Left;   EYE SURGERY     rt retina detachment   HAMMER TOE SURGERY  2004   HERNIA REPAIR  2006   SHOULDER ARTHROSCOPY W/ ROTATOR CUFF REPAIR  2010   TONSILLECTOMY     Patient Active Problem List   Diagnosis Date Noted   Macular degeneration 03/01/2022   Myofascial pain 02/10/2022   Lumbar spondylosis 01/03/2022   Acquired clawfoot, left foot 12/16/2021   Acquired clawfoot, right foot 12/16/2021   Claw foot 12/14/2021   Gait abnormality 12/10/2021   Spinal stenosis at L4-L5 level 12/10/2021   Ataxia 11/22/2021   Lightheadedness 11/02/2021   Claudication of lower extremity ([place]) 10/21/2021   Atrophy of calf muscles 10/21/2021   Weakness generalized 10/21/2021   Focal neurological deficit 10/21/2021   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   Pain in joint of right hip 09/14/2021   High arches 07/22/2021   Hammertoes of both feet 07/22/2021   Neuropathy 07/22/2021   Constipation 05/28/2021   Disequilibrium 05/28/2021   Personal history of colonic polyps 05/28/2021   Renal cyst 05/28/2021   Gastroesophageal reflux disease  without esophagitis 05/05/2021   Ingrowing left great toenail 02/15/2021   S/P lumbar fusion 12/24/2020   Spinal stenosis of lumbar region 12/04/2020   Acquired complex renal cyst 11/23/2020   Functional gait abnormality 11/04/2020   Idiopathic neuropathy 11/02/2020   Tinea pedis of both feet 10/28/2020   Loop - Medtronic Linq 10/22/2020 10/22/2020   Benign hypertension with CKD (chronic kidney disease) stage III ([place]) 10/05/2020   Fall with injury 10/05/2020   Hematoma 09/16/2020   Normocytic anemia 09/14/2020   AF (paroxysmal atrial fibrillation) ([place]) 09/14/2020   Chronic anticoagulation 09/14/2020   Hyponatremia 09/14/2020   Pain in left foot 01/22/2020   Acquired thrombophilia  ([place]) 12/30/2019   Pain in joint of right knee 12/27/2019   Recurrent falls 11/17/2019   Paroxysmal atrial fibrillation ([place]) 10/22/2019   Pain in right foot 10/14/2019   Falls 10/07/2019   Ataxia involving legs 08/12/2019   OSA on CPAP 06/20/2018   Urge incontinence 04/13/2018   Urinary urgency 04/13/2018   Pain in left knee 03/01/2018   Other intervertebral disc degeneration, lumbar region 06/16/2017   Acquired bilateral hammer toes 10/05/2015   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   Generalized anxiety disorder 05/12/2015   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[place]) 05/10/2015   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[place]) 04/10/2014   History of left-sided carotid endarterectomy 04/10/2014   Essential hypertension 04/10/2014   Occlusion and stenosis of carotid artery without mention of cerebral infarction 12/15/2011   Habitual alcohol use 12/01/2011   Carotid artery stenosis, symptomatic 11/30/2011   Cerebral infarction ([place]) 11/29/2011   Hyperlipidemia     PCP: Berniece Pap  REFERRING PROVIDER: Berniece Pap  REFERRING DIAG: M54.42,M54.41,G89.29   Rationale for Evaluation and Treatment: Rehabilitation  THERAPY DIAG:  Repeated falls  Other abnormalities of gait and mobility  ONSET DATE: 01/25/22  SUBJECTIVE:                                                                                                                                                                                           SUBJECTIVE STATEMENT: Patient reports that he was very tired and weak after the last visit, cancelled one visit due to this, he does report a fall on the stairs over the weekend.  He also reports that he got new glasses as well about a week ago and he is not happy with them and his double vision  PERTINENT HISTORY:  Cerebral infarct 10/21/21, lumbar fusion, recurrent falls   PAIN:  Are you having pain? Yes: NPRS scale: 0/10 Pain location: low  back Pain description: ache  Aggravating factors: being on  my feet  Relieving factors: movement  PRECAUTIONS: Fall  WEIGHT BEARING RESTRICTIONS: No  FALLS:  Has patient fallen in last 6 months? Yes. Number of falls every few days   LIVING ENVIRONMENT: Lives with: lives with their spouse Lives in: House/apartment Stairs: Yes: Internal: 20 steps; on right going up Has following equipment at home: Walker - 4 wheeled and trekking pole  OCCUPATION: Retired  PLOF: Independent  PATIENT GOALS: to not fall and go deer hunting again  NEXT MD VISIT:   OBJECTIVE:   DIAGNOSTIC FINDINGS:    LUMBAR SPINE FINDINGS: 1. Wide laminectomy and posterior pedicle screw and rod fixation at L4-5 without residual or recurrent stenosis. 2. Progressive adjacent level disease at L3-4 with moderate right and mild left subarticular narrowing and moderate foraminal stenosis bilaterally. Progression is predominantly due to facet disease 3. Progressive moderate facet hypertrophy at L1-2 with a progressive rightward disc protrusion resulting in progressive moderate right foraminal stenosis. Asymmetric endplate marrow edema on the right at L1-2 is consistent with progressive disease as well. 4. Otherwise stable degenerative change without other focal stenosis.   THORACIC SPINE FINDINGS:  On sagittal views the vertebral bodies have normal height and alignment.  Hemangioma within T9 vertebral body and slightly within T10 vertebral body.  Anterior kyphosis.  Mild scoliosis convex right centered at T8 level. The spinal cord is normal in size and appearance. The paraspinal soft tissues are unremarkable.     On axial views there is no spinal stenosis or foraminal narrowing.  Limited views of the aorta, kidneys, liver, lungs and paraspinal muscles are notable for multiple large right renal cysts ranging in size from 1.5 to 7 cm.   COGNITION: Overall cognitive status: Within functional limits for tasks  assessed     SENSATION: WFL  MUSCLE LENGTH: Hamstrings: mod tightness on both sides  POSTURE: rounded shoulders and forward head  LUMBAR ROM:   AROM eval  Flexion Limited due to HS tightness  Extension WFL  Right lateral flexion Tightness in R side  Left lateral flexion WFL  Right rotation WFL  Left rotation Tightness on R side   (Blank rows = not tested)  LOWER EXTREMITY ROM:   grossly WFL    LOWER EXTREMITY MMT:  grossly 4+/5   FUNCTIONAL TESTS:  5 times sit to stand: 20s Timed up and go (TUG): 16s   04/15/22:  3 minute walk test:  had to sit and rest at 30mnute 45 seconds and went 210 feet GAIT: Distance walked: in clinic distances Assistive device utilized:  trekking pole Level of assistance: SBA Comments: ataxic gait pattern, increased lateral sway, decreased safety awareness   TODAY'S TREATMENT:                                                                                                                              DATE:  05/12/22 TUG- 13.31s Bike L3 x653ms  Step up on airex 2HHA Standing on airex box taps  6" 2HHA  Calf raises 2x12 STS on airex 2x10 Shoulder ext 15# 2x10 Cable rows 15# 2x10 Leg press 60# 3x10 Pball roll outs x10 then x10 each side   05/10/22 Nustep level 5 x 6 minutes In pbars side stepping on the airex balance beam needed to hold on today Fee on ball K2C, trunk rotation, small bridge and isometric abs LE stretches Ball b/n knees bridges Clamshell bridges Quadraped bird dogs Tmill 3 minutes 1.0 mph 10# straight arm pulls 15# chest press 2x15   05/03/22 Nustep level 5 x 6 minutes Minitramp bounce and marching Pball rollouts for lumbar stretches Sidestepping in pbars on airex balance beam 10# sit to stand with OHP Volleyball Leg press 40# x10, then single legs 40# 2x10 15# straight arm pulls 15# AR press Sit to stand on airex 5# hip abduction and extension Partial situp/crunches  04/27/22 NuStep L5 x32mns  Leg ext 35#  2x10 HS curls 45# 2x10 STS on airex 2x10 Standing on airex playing catch- one LOB backwards onto mat table 45# seated rows 2x10 35# lats 2x10 Leg press 60# 2x10 Shoulder ext 5# 2x10 Box taps 4" Side steps on beam  04/26/22 Nustep level 5 x 6 mintues Leg extension and abduction 5# pulleys 2x10 CGA Supine feet on ball K2C, trunk rotation, small bridges and isometric abs Passive LE stretches Quadraped hip extension and then alternating arms and leg lifts with CGA due to poor balance Ambulated with him out the back door through the grass and uneven terrain to his car with CGA, negotiated a curb  04/20/22 Elliptical level 1 x 2 minutes Bike Level 5 x 5 mintues with power burst every minute Tmill 20 seconds push and pull x 3 each CGA/min A Cone toe touches and hand touches Volleyball 15# straight arm pulls CGA for balance 45# seated rows 35# lats 25# chest press all 2x10 6" toe touches, and step ups 20# bow pulls On mat walking figure 8's  04/18/22 Nustep level 5 x 6 minutes Tmill push and pull 20 seconds 3 x each needed Max A for the push backward Side stepping on the airx balance beam Side stepping in pbars without holding 12" toe touch on and off airex with SPC  Side step on and off airex In corner on airex head turns , eyes ope and closed 4" step up and over  04/15/22 Nustep level 5 x 6 minutes In pbars side stepping and back ward walking 6" toe touches Butt touches and shoulder blade touches working on balance strategies On airex in the corner reaching for cones Solid surface ball toss Walking ball toss 3 minute walk test made it to 135mute 45 seconds and 210 feet Resisted gait 30# all direction  04/12/22 Bike Level 4 x 6 minutes Worked on stairs with one handrail Cone toe touches in P bars 4" and 6" toe touches with and without hand hold Worked on hip, ankle and step strategies for balance Backward walking in pbars Side step over bar Stepping over objects  using cane Cone hand touches and having him come all the way up  04/07/22 Bike L3.5 x 6 min Alt 4 in box taps w/ Trekking pole 2x10 S2S LE on airex 2x10 elevated mat  Step ups on airex with trekking pole 2x5 each Leg press 70lb 2x12, Heel raises 40lb 3x15  Seated Rows and lats 35lb 3x12 Chest press 20lb 2x12 Shoulder Ext 10lb 2x10   PATIENT EDUCATION:  Education details: HEP and POC Person educated: Patient  Education method: Explanation Education comprehension: verbalized understanding  HOME EXERCISE PROGRAM: Marching, 3 way leg kicks, mini squats, heel raises, STS from chair   ASSESSMENT:  CLINICAL IMPRESSION: Patient is more unsteady on his feet today. He reports that something is going on with his eyes, and he is seeing double sometimes. He does well with STS on airex today, very controlled. Patient would like to explore the options of dry needling for his low back as he states it is very tight next visit.   OBJECTIVE IMPAIRMENTS: Abnormal gait, decreased balance, decreased coordination, and difficulty walking.   ACTIVITY LIMITATIONS: squatting, stairs, transfers, and locomotion level  REHAB POTENTIAL: Fair dependent on carry over and patient compliance  CLINICAL DECISION MAKING: Stable/uncomplicated  EVALUATION COMPLEXITY: Low  GOALS: Goals reviewed with patient? Yes  SHORT TERM GOALS: Target date: 04/06/22  Patient will be independent with initial HEP. Goal status: Met 03/07/22  2.  Patient will be educated on strategies to decrease risk of falls.  Goal status: 03/07/22   LONG TERM GOALS: Target date: 05/18/22  Patient will be independent with advanced/ongoing HEP to improve outcomes and carryover.  Goal status: Met  2.  Patient will demonstrate decreased fall risk by scoring < 12 sec on TUG. Baseline: 16s Goal status: Progressing 04/05/22 - 12.25 sec, 13.31s-05/12/22  3.  Patient will demonstrate improved functional LE strength as demonstrated by < 15s on  5xSTS. Baseline: 20s Goal status: Met 11.09 03/25/22  4.  Patient will score 42 on Berg Balance test to demonstrate lower risk of falls. (MCID= 8 points) .  Baseline: 36/56 Goal status: Met 42/56 04/05/22  5.  Patient will report no falls in a 4 weeks period. Baseline: falls every few days Goal status: On going  had a fall week of 04/18/22, another fall 05/04/22  PLAN:  PT FREQUENCY: 2x/week  PT DURATION: 12 weeks  PLANNED INTERVENTIONS: Therapeutic exercises, Therapeutic activity, Neuromuscular re-education, Balance training, Gait training, Patient/Family education, Self Care, Joint mobilization, Stair training, Cryotherapy, Moist heat, and Manual therapy.  PLAN FOR NEXT SESSION: work and focus on balance strategies   Andris Baumann, PT 05/12/2022, 10:11 AM

## 2022-05-12 ENCOUNTER — Ambulatory Visit: Payer: Medicare Other

## 2022-05-12 DIAGNOSIS — R2689 Other abnormalities of gait and mobility: Secondary | ICD-10-CM

## 2022-05-12 DIAGNOSIS — R296 Repeated falls: Secondary | ICD-10-CM

## 2022-05-16 ENCOUNTER — Ambulatory Visit: Payer: Medicare Other | Admitting: Physical Therapy

## 2022-05-16 ENCOUNTER — Encounter: Payer: Self-pay | Admitting: Physical Therapy

## 2022-05-16 DIAGNOSIS — M5459 Other low back pain: Secondary | ICD-10-CM

## 2022-05-16 DIAGNOSIS — R2689 Other abnormalities of gait and mobility: Secondary | ICD-10-CM

## 2022-05-16 DIAGNOSIS — R296 Repeated falls: Secondary | ICD-10-CM | POA: Diagnosis not present

## 2022-05-16 NOTE — Therapy (Signed)
OUTPATIENT PHYSICAL THERAPY THORACOLUMBAR TREATMENT    Patient Name: Brandon Mendoza MRN: [id number] DOB:23-Apr-1940, 82 y.o., male Today's Date: 05/16/2022  END OF SESSION:  PT End of Session - 05/16/22 1308     Visit Number 21    Date for PT Re-Evaluation 05/18/22    Authorization Type [employer]    PT Start Time 1307    PT Stop Time 1357    PT Time Calculation (min) 50 min    Equipment Utilized During Treatment Gait belt    Activity Tolerance Patient tolerated treatment well             Past Medical History:  Diagnosis Date   Acquired clawfoot, right foot 12/16/2021   Anxiety    Atrophy of calf muscles 10/21/2021   Duplex calves was negative for significant blockage EMG was done at [facility] MRI lumbar spine was also done at [facility]   Carotid artery occlusion    COPD (chronic obstructive pulmonary disease) (HCC)    Depression    Dysrhythmia    PAF, atrial tachycardia   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   The spinal cord appears normal. 2.   No spinal stenosis. 3.   Mild multilevel degenerative changes as detailed above that do not lead to spinal stenosis or nerve root compression. 4.   T2 hyperintense foci within the pons consistent with chronic microvascular ischemic changes.   Loop - Medtronic Linq 10/22/2020 10/22/2020   Nocturia    Peripheral vascular disease (HCC)    Sleep apnea    on 6 cm, nasal pillow   Stroke ([place]) 05/01/2011   ischemic   Weakness generalized 10/21/2021   Severe, legs and arms   Past Surgical History:  Procedure Laterality Date   CAROTID ENDARTERECTOMY  12/09/11   Left cea   ENDARTERECTOMY  12/09/2011   Procedure: ENDARTERECTOMY CAROTID;  Surgeon: Rosetta Posner, MD;  Location: [facility];  Service: Vascular;  Laterality:  Left;   EYE SURGERY     rt retina detachment   HAMMER TOE SURGERY  2004   HERNIA REPAIR  2006   SHOULDER ARTHROSCOPY W/ ROTATOR CUFF REPAIR  2010   TONSILLECTOMY     Patient Active Problem List   Diagnosis Date Noted   Macular degeneration 03/01/2022   Myofascial pain 02/10/2022   Lumbar spondylosis 01/03/2022   Acquired clawfoot, left foot 12/16/2021   Acquired clawfoot, right foot 12/16/2021   Claw foot 12/14/2021   Gait abnormality 12/10/2021   Spinal stenosis at L4-L5 level 12/10/2021   Ataxia 11/22/2021   Lightheadedness 11/02/2021   Claudication of lower extremity ([place]) 10/21/2021   Atrophy of calf muscles 10/21/2021   Weakness generalized 10/21/2021   Focal neurological deficit 10/21/2021   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   Pain in joint of right hip 09/14/2021   High arches 07/22/2021   Hammertoes of both feet 07/22/2021   Neuropathy 07/22/2021   Constipation 05/28/2021   Disequilibrium 05/28/2021   Personal history of colonic polyps 05/28/2021   Renal cyst 05/28/2021   Gastroesophageal reflux disease without esophagitis 05/05/2021   Ingrowing left great toenail 02/15/2021   S/P lumbar fusion 12/24/2020   Spinal stenosis of  lumbar region 12/04/2020   Acquired complex renal cyst 11/23/2020   Functional gait abnormality 11/04/2020   Idiopathic neuropathy 11/02/2020   Tinea pedis of both feet 10/28/2020   Loop - Medtronic Linq 10/22/2020 10/22/2020   Benign hypertension with CKD (chronic kidney disease) stage III ([place]) 10/05/2020   Fall with injury 10/05/2020   Hematoma 09/16/2020   Normocytic anemia 09/14/2020   AF (paroxysmal atrial fibrillation) ([place]) 09/14/2020   Chronic anticoagulation 09/14/2020   Hyponatremia 09/14/2020   Pain in left foot 01/22/2020   Acquired thrombophilia ([place]) 12/30/2019   Pain in joint of right knee 12/27/2019   Recurrent falls 11/17/2019   Paroxysmal atrial fibrillation ([place]) 10/22/2019    Pain in right foot 10/14/2019   Falls 10/07/2019   Ataxia involving legs 08/12/2019   OSA on CPAP 06/20/2018   Urge incontinence 04/13/2018   Urinary urgency 04/13/2018   Pain in left knee 03/01/2018   Other intervertebral disc degeneration, lumbar region 06/16/2017   Acquired bilateral hammer toes 10/05/2015   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   Generalized anxiety disorder 05/12/2015   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[place]) 05/10/2015   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[place]) 04/10/2014   History of left-sided carotid endarterectomy 04/10/2014   Essential hypertension 04/10/2014   Occlusion and stenosis of carotid artery without mention of cerebral infarction 12/15/2011   Habitual alcohol use 12/01/2011   Carotid artery stenosis, symptomatic 11/30/2011   Cerebral infarction ([place]) 11/29/2011   Hyperlipidemia     PCP: Berniece Pap  REFERRING PROVIDER: Berniece Pap  REFERRING DIAG: M54.42,M54.41,G89.29   Rationale for Evaluation and Treatment: Rehabilitation  THERAPY DIAG:  Repeated falls  Other abnormalities of gait and mobility  Other low back pain  ONSET DATE: 01/25/22  SUBJECTIVE:                                                                                                                                                                                           SUBJECTIVE STATEMENT: Patient reports that he did better after the last visit, not as tired.  He will have a foot surgery in the next week or so.  No falls over the past week  PERTINENT HISTORY:  Cerebral infarct 10/21/21, lumbar fusion, recurrent falls   PAIN:  Are you having pain? Yes: NPRS scale: 0/10 Pain location: low back Pain description: ache  Aggravating factors: being on my feet  Relieving factors: movement  PRECAUTIONS: Fall  WEIGHT BEARING RESTRICTIONS: No  FALLS:  Has patient fallen in last 6 months? Yes. Number of falls every few days    LIVING ENVIRONMENT: Lives with: lives with  their spouse Lives in: House/apartment Stairs: Yes: Internal: 20 steps; on right going up Has following equipment at home: Gilford Rile - 4 wheeled and trekking pole  OCCUPATION: Retired  PLOF: Independent  PATIENT GOALS: to not fall and go deer hunting again  NEXT MD VISIT:   OBJECTIVE:   DIAGNOSTIC FINDINGS:    LUMBAR SPINE FINDINGS: 1. Wide laminectomy and posterior pedicle screw and rod fixation at L4-5 without residual or recurrent stenosis. 2. Progressive adjacent level disease at L3-4 with moderate right and mild left subarticular narrowing and moderate foraminal stenosis bilaterally. Progression is predominantly due to facet disease 3. Progressive moderate facet hypertrophy at L1-2 with a progressive rightward disc protrusion resulting in progressive moderate right foraminal stenosis. Asymmetric endplate marrow edema on the right at L1-2 is consistent with progressive disease as well. 4. Otherwise stable degenerative change without other focal stenosis.   THORACIC SPINE FINDINGS:  On sagittal views the vertebral bodies have normal height and alignment.  Hemangioma within T9 vertebral body and slightly within T10 vertebral body.  Anterior kyphosis.  Mild scoliosis convex right centered at T8 level. The spinal cord is normal in size and appearance. The paraspinal soft tissues are unremarkable.     On axial views there is no spinal stenosis or foraminal narrowing.  Limited views of the aorta, kidneys, liver, lungs and paraspinal muscles are notable for multiple large right renal cysts ranging in size from 1.5 to 7 cm.   COGNITION: Overall cognitive status: Within functional limits for tasks assessed     SENSATION: WFL  MUSCLE LENGTH: Hamstrings: mod tightness on both sides  POSTURE: rounded shoulders and forward head  LUMBAR ROM:   AROM eval  Flexion Limited due to HS tightness  Extension WFL  Right lateral flexion  Tightness in R side  Left lateral flexion WFL  Right rotation WFL  Left rotation Tightness on R side   (Blank rows = not tested)  LOWER EXTREMITY ROM:   grossly WFL    LOWER EXTREMITY MMT:  grossly 4+/5   FUNCTIONAL TESTS:  5 times sit to stand: 20s Timed up and go (TUG): 16s   04/15/22:  3 minute walk test:  had to sit and rest at 84mnute 45 seconds and went 210 feet GAIT: Distance walked: in clinic distances Assistive device utilized:  trekking pole Level of assistance: SBA Comments: ataxic gait pattern, increased lateral sway, decreased safety awareness   TODAY'S TREATMENT:                                                                                                                              DATE:  05/16/22 Nustep level 5 x 6 minutes In pbars side stepping Marches, hip abduction, extension 2# Side stepping in pbars Calf raises, toe raises Side stepping over objects Side stepping on the airex balance beam Gait with gait belt, and walking stick, around the building in the back, 5 rest breaks 4 sitting and 1 standing  05/12/22 TUG- 13.31s Bike L3 x30mns  Step up on airex 2HHA Standing on airex box taps 6" 2HHA  Calf raises 2x12 STS on airex 2x10 Shoulder ext 15# 2x10 Cable rows 15# 2x10 Leg press 60# 3x10 Pball roll outs x10 then x10 each side   05/10/22 Nustep level 5 x 6 minutes In pbars side stepping on the airex balance beam needed to hold on today Fee on ball K2C, trunk rotation, small bridge and isometric abs LE stretches Ball b/n knees bridges Clamshell bridges Quadraped bird dogs Tmill 3 minutes 1.0 mph 10# straight arm pulls 15# chest press 2x15   05/03/22 Nustep level 5 x 6 minutes Minitramp bounce and marching Pball rollouts for lumbar stretches Sidestepping in pbars on airex balance beam 10# sit to stand with OHP Volleyball Leg press 40# x10, then single legs 40# 2x10 15# straight arm pulls 15# AR press Sit to stand on airex 5# hip  abduction and extension Partial situp/crunches  04/27/22 NuStep L5 x6626ms  Leg ext 35# 2x10 HS curls 45# 2x10 STS on airex 2x10 Standing on airex playing catch- one LOB backwards onto mat table 45# seated rows 2x10 35# lats 2x10 Leg press 60# 2x10 Shoulder ext 5# 2x10 Box taps 4" Side steps on beam  04/26/22 Nustep level 5 x 6 mintues Leg extension and abduction 5# pulleys 2x10 CGA Supine feet on ball K2C, trunk rotation, small bridges and isometric abs Passive LE stretches Quadraped hip extension and then alternating arms and leg lifts with CGA due to poor balance Ambulated with him out the back door through the grass and uneven terrain to his car with CGA, negotiated a curb  04/20/22 Elliptical level 1 x 2 minutes Bike Level 5 x 5 mintues with power burst every minute Tmill 20 seconds push and pull x 3 each CGA/min A Cone toe touches and hand touches Volleyball 15# straight arm pulls CGA for balance 45# seated rows 35# lats 25# chest press all 2x10 6" toe touches, and step ups 20# bow pulls On mat walking figure 8's  04/18/22 Nustep level 5 x 6 minutes Tmill push and pull 20 seconds 3 x each needed Max A for the push backward Side stepping on the airx balance beam Side stepping in pbars without holding 12" toe touch on and off airex with SPC  Side step on and off airex In corner on airex head turns , eyes ope and closed 4" step up and over  04/15/22 Nustep level 5 x 6 minutes In pbars side stepping and back ward walking 6" toe touches Butt touches and shoulder blade touches working on balance strategies On airex in the corner reaching for cones Solid surface ball toss Walking ball toss 3 minute walk test made it to [id number] 45 seconds and 210 feet Resisted gait 30# all direction  04/12/22 Bike Level 4 x 6 minutes Worked on stairs with one handrail Cone toe touches in P bars 4" and 6" toe touches with and without hand hold Worked on hip, ankle and step  strategies for balance Backward walking in pbars Side step over bar Stepping over objects using cane Cone hand touches and having him come all the way up  04/07/22 Bike L3.5 x 6 min Alt 4 in box taps w/ Trekking pole 2x10 S2S LE on airex 2x10 elevated mat  Step ups on airex with trekking pole 2x5 each Leg press 70lb 2x12, Heel raises 40lb 3x15  Seated Rows and lats 35lb 3x12 Chest press 20lb 2x12  Shoulder Ext 10lb 2x10   PATIENT EDUCATION:  Education details: HEP and POC Person educated: Patient Education method: Explanation Education comprehension: verbalized understanding  HOME EXERCISE PROGRAM: Marching, 3 way leg kicks, mini squats, heel raises, STS from chair   ASSESSMENT:  CLINICAL IMPRESSION: Next visit may be his last, he will be having foot surgery soon, we did the walk around the back building today, he was able to go down the slope but really at the back of the building needed Min/Mod A to walk the next 50 feet to the picnic table, he then needed 3 more sitting rest breaks and 1 standing rest break, reported fatigue and weakness in the left leg, but once he rested he did well  OBJECTIVE IMPAIRMENTS: Abnormal gait, decreased balance, decreased coordination, and difficulty walking.   ACTIVITY LIMITATIONS: squatting, stairs, transfers, and locomotion level  REHAB POTENTIAL: Fair dependent on carry over and patient compliance  CLINICAL DECISION MAKING: Stable/uncomplicated  EVALUATION COMPLEXITY: Low  GOALS: Goals reviewed with patient? Yes  SHORT TERM GOALS: Target date: 04/06/22  Patient will be independent with initial HEP. Goal status: Met 03/07/22  2.  Patient will be educated on strategies to decrease risk of falls.  Goal status: 03/07/22   LONG TERM GOALS: Target date: 05/18/22  Patient will be independent with advanced/ongoing HEP to improve outcomes and carryover.  Goal status: Met  2.  Patient will demonstrate decreased fall risk by scoring < 12  sec on TUG. Baseline: 16s Goal status: Progressing 04/05/22 - 12.25 sec, 13.31s-05/12/22  3.  Patient will demonstrate improved functional LE strength as demonstrated by < 15s on 5xSTS. Baseline: 20s Goal status: Met 11.09 03/25/22  4.  Patient will score 42 on Berg Balance test to demonstrate lower risk of falls. (MCID= 8 points) .  Baseline: 36/56 Goal status: Met 42/56 04/05/22  5.  Patient will report no falls in a 4 weeks period. Baseline: falls every few days Goal status: On going  had a fall week of 04/18/22, another fall 05/04/22  PLAN:  PT FREQUENCY: 2x/week  PT DURATION: 12 weeks  PLANNED INTERVENTIONS: Therapeutic exercises, Therapeutic activity, Neuromuscular re-education, Balance training, Gait training, Patient/Family education, Self Care, Joint mobilization, Stair training, Cryotherapy, Moist heat, and Manual therapy.  PLAN FOR NEXT SESSION: talk about what he can do at home and finalize HEP   Sumner Boast, PT 05/16/2022, 1:08 PM

## 2022-05-18 ENCOUNTER — Encounter: Payer: Self-pay | Admitting: Cardiology

## 2022-05-18 ENCOUNTER — Ambulatory Visit: Payer: Medicare Other | Admitting: Physical Therapy

## 2022-05-18 ENCOUNTER — Encounter: Payer: Self-pay | Admitting: Physical Therapy

## 2022-05-18 DIAGNOSIS — R296 Repeated falls: Secondary | ICD-10-CM

## 2022-05-18 DIAGNOSIS — M5459 Other low back pain: Secondary | ICD-10-CM

## 2022-05-18 DIAGNOSIS — R2689 Other abnormalities of gait and mobility: Secondary | ICD-10-CM

## 2022-05-18 NOTE — Therapy (Signed)
OUTPATIENT PHYSICAL THERAPY THORACOLUMBAR TREATMENT    Patient Name: Brandon Mendoza MRN: [id number] DOB:Jul 01, 1940, 82 y.o., male Today's Date: 05/18/2022  END OF SESSION:  PT End of Session - 05/18/22 0846     Visit Number 22    Date for PT Re-Evaluation 05/18/22    Authorization Type [employer]    PT Start Time [phone]    PT Stop Time 0930    PT Time Calculation (min) 47 min    Equipment Utilized During Treatment Gait belt    Activity Tolerance Patient tolerated treatment well    Behavior During Therapy WFL for tasks assessed/performed             Past Medical History:  Diagnosis Date   Acquired clawfoot, right foot 12/16/2021   Anxiety    Atrophy of calf muscles 10/21/2021   Duplex calves was negative for significant blockage [facility] spine [facility] at [facility]   Carotid artery occlusion    COPD (chronic obstructive pulmonary disease) (HCC)    Depression    Dysrhythmia    PAF, atrial tachycardia   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   The spinal cord appears normal. 2.   No spinal stenosis. 3.   Mild multilevel degenerative changes as detailed above that do not lead to spinal stenosis or nerve root compression. 4.   T2 hyperintense foci within the pons consistent with chronic microvascular ischemic changes.   Loop - Medtronic Linq 10/22/2020 10/22/2020   Nocturia    Peripheral vascular disease (HCC)    Sleep apnea    on 6 cm, nasal pillow   Stroke ([place]) 05/01/2011   ischemic   Weakness generalized 10/21/2021   Severe, legs and arms   Past Surgical History:  Procedure Laterality Date   CAROTID ENDARTERECTOMY  12/09/11   Left cea   ENDARTERECTOMY  12/09/2011   Procedure: ENDARTERECTOMY CAROTID;  Surgeon: Rosetta Posner, MD;  Location: [facility];  Service: Vascular;  Laterality: Left;   EYE SURGERY     rt retina detachment   HAMMER TOE SURGERY  2004   HERNIA REPAIR  2006   SHOULDER ARTHROSCOPY W/ ROTATOR CUFF REPAIR  2010   TONSILLECTOMY     Patient Active Problem List   Diagnosis Date Noted   Macular degeneration 03/01/2022   Myofascial pain 02/10/2022   Lumbar spondylosis 01/03/2022   Acquired clawfoot, left foot 12/16/2021   Acquired clawfoot, right foot 12/16/2021   Claw foot 12/14/2021   Gait abnormality 12/10/2021   Spinal stenosis at L4-L5 level 12/10/2021   Ataxia 11/22/2021   Lightheadedness 11/02/2021   Claudication of lower extremity ([place]) 10/21/2021   Atrophy of calf muscles 10/21/2021   Weakness generalized 10/21/2021   Focal neurological deficit 10/21/2021   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   Pain in joint of right hip 09/14/2021   High arches 07/22/2021   Hammertoes of both feet 07/22/2021   Neuropathy 07/22/2021   Constipation 05/28/2021   Disequilibrium 05/28/2021   Personal history of colonic polyps 05/28/2021   Renal cyst 05/28/2021   Gastroesophageal reflux disease without esophagitis 05/05/2021   Ingrowing left great toenail 02/15/2021  S/P lumbar fusion 12/24/2020   Spinal stenosis of lumbar region 12/04/2020   Acquired complex renal cyst 11/23/2020   Functional gait abnormality 11/04/2020   Idiopathic neuropathy 11/02/2020   Tinea pedis of both feet 10/28/2020   Loop - Medtronic Linq 10/22/2020 10/22/2020   Benign hypertension with CKD (chronic kidney disease) stage III ([place]) 10/05/2020   Fall with injury 10/05/2020   Hematoma 09/16/2020   Normocytic anemia 09/14/2020   AF (paroxysmal atrial fibrillation) ([place]) 09/14/2020   Chronic anticoagulation 09/14/2020   Hyponatremia 09/14/2020   Pain in left foot 01/22/2020   Acquired thrombophilia ([place]) 12/30/2019   Pain in joint of right knee 12/27/2019   Recurrent falls  11/17/2019   Paroxysmal atrial fibrillation ([place]) 10/22/2019   Pain in right foot 10/14/2019   Falls 10/07/2019   Ataxia involving legs 08/12/2019   OSA on CPAP 06/20/2018   Urge incontinence 04/13/2018   Urinary urgency 04/13/2018   Pain in left knee 03/01/2018   Other intervertebral disc degeneration, lumbar region 06/16/2017   Acquired bilateral hammer toes 10/05/2015   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   Generalized anxiety disorder 05/12/2015   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[place]) 05/10/2015   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[place]) 04/10/2014   History of left-sided carotid endarterectomy 04/10/2014   Essential hypertension 04/10/2014   Occlusion and stenosis of carotid artery without mention of cerebral infarction 12/15/2011   Habitual alcohol use 12/01/2011   Carotid artery stenosis, symptomatic 11/30/2011   Cerebral infarction ([place]) 11/29/2011   Hyperlipidemia     PCP: Berniece Pap  REFERRING PROVIDER: Berniece Pap  REFERRING DIAG: M54.42,M54.41,G89.29   Rationale for Evaluation and Treatment: Rehabilitation  THERAPY DIAG:  Repeated falls  Other abnormalities of gait and mobility  Other low back pain  ONSET DATE: 01/25/22  SUBJECTIVE:                                                                                                                                                                                           SUBJECTIVE STATEMENT: Patient reports that he was sore and a little stiff but thought it was a good workout  PERTINENT HISTORY:  Cerebral infarct 10/21/21, lumbar fusion, recurrent falls   PAIN:  Are you having pain? Yes: NPRS scale: 0/10 Pain location: low back Pain description: ache  Aggravating factors: being on my feet  Relieving factors: movement  PRECAUTIONS: Fall  WEIGHT BEARING RESTRICTIONS: No  FALLS:  Has patient fallen in last 6 months? Yes. Number of falls every few days    LIVING ENVIRONMENT: Lives with: lives with their spouse Lives in: House/apartment Stairs: Yes:  Internal: 20 steps; on right going up Has following equipment at home: Gilford Rile - 4 wheeled and trekking pole  OCCUPATION: Retired  PLOF: Independent  PATIENT GOALS: to not fall and go deer hunting again  NEXT MD VISIT:   OBJECTIVE:   DIAGNOSTIC FINDINGS:    LUMBAR SPINE FINDINGS: 1. Wide laminectomy and posterior pedicle screw and rod fixation at L4-5 without residual or recurrent stenosis. 2. Progressive adjacent level disease at L3-4 with moderate right and mild left subarticular narrowing and moderate foraminal stenosis bilaterally. Progression is predominantly due to facet disease 3. Progressive moderate facet hypertrophy at L1-2 with a progressive rightward disc protrusion resulting in progressive moderate right foraminal stenosis. Asymmetric endplate marrow edema on the right at L1-2 is consistent with progressive disease as well. 4. Otherwise stable degenerative change without other focal stenosis.   THORACIC SPINE FINDINGS:  On sagittal views the vertebral bodies have normal height and alignment.  Hemangioma within T9 vertebral body and slightly within T10 vertebral body.  Anterior kyphosis.  Mild scoliosis convex right centered at T8 level. The spinal cord is normal in size and appearance. The paraspinal soft tissues are unremarkable.     On axial views there is no spinal stenosis or foraminal narrowing.  Limited views of the aorta, kidneys, liver, lungs and paraspinal muscles are notable for multiple large right renal cysts ranging in size from 1.5 to 7 cm.   COGNITION: Overall cognitive status: Within functional limits for tasks assessed     SENSATION: WFL  MUSCLE LENGTH: Hamstrings: mod tightness on both sides  POSTURE: rounded shoulders and forward head  LUMBAR ROM:   AROM eval  Flexion Limited due to HS tightness  Extension WFL  Right lateral flexion  Tightness in R side  Left lateral flexion WFL  Right rotation WFL  Left rotation Tightness on R side   (Blank rows = not tested)  LOWER EXTREMITY ROM:   grossly WFL    LOWER EXTREMITY MMT:  grossly 4+/5   FUNCTIONAL TESTS:  5 times sit to stand: 20s   05/18/22 = 14 seconds Timed up and go (TUG): 16s  05/18/22 = 11 seconds without device   04/15/22:  3 minute walk test:  had to sit and rest at 16mnute 45 seconds and went 210 feet,  05/18/22 3 minutes 500 feet GAIT: Distance walked: in clinic distances Assistive device utilized:  trekking pole Level of assistance: SBA Comments: ataxic gait pattern, increased lateral sway, decreased safety awareness   TODAY'S TREATMENT:                                                                                                                              DATE:  05/18/22 Nustep level 5 x 65mutes Bike level 4 x 6 minutes with 3 power bursts 12" toe touches and then on airex with a lot of assist Side stepping, side stepping over objects Direction changes  7# farmer carry Stepping over objects forward  05/16/22 Nustep level 5 x 6 minutes In pbars side stepping Marches, hip abduction, extension 2# Side stepping in pbars Calf raises, toe raises Side stepping over objects Side stepping on the airex balance beam Gait with gait belt, and walking stick, around the building in the back, 5 rest breaks 4 sitting and 1 standing   05/12/22 TUG- 13.31s Bike L3 x71mns  Step up on airex 2HHA Standing on airex box taps 6" 2HHA  Calf raises 2x12 STS on airex 2x10 Shoulder ext 15# 2x10 Cable rows 15# 2x10 Leg press 60# 3x10 Pball roll outs x10 then x10 each side   05/10/22 Nustep level 5 x 6 minutes In pbars side stepping on the airex balance beam needed to hold on today Fee on ball K2C, trunk rotation, small bridge and isometric abs LE stretches Ball b/n knees bridges Clamshell bridges Quadraped bird dogs Tmill 3 minutes 1.0 mph 10# straight  arm pulls 15# chest press 2x15   05/03/22 Nustep level 5 x 6 minutes Minitramp bounce and marching Pball rollouts for lumbar stretches Sidestepping in pbars on airex balance beam 10# sit to stand with OHP Volleyball Leg press 40# x10, then single legs 40# 2x10 15# straight arm pulls 15# AR press Sit to stand on airex 5# hip abduction and extension Partial situp/crunches    PATIENT EDUCATION:  Education details: HEP and POC Person educated: Patient Education method: Explanation Education comprehension: verbalized understanding  HOME EXERCISE PROGRAM: Marching, 3 way leg kicks, mini squats, heel raises, STS from chair   ASSESSMENT:  CLINICAL IMPRESSION: We will d/c today with great progress toward goals, independence and safety.  He improved his 5xSTS test from 20 seconds to 14 seconds, improved the TUG time from 15 seconds to 11 seconds, improved the 3 minute walk test from 210 feet to 500 feet.  I feel that he has made great improvements the first time I saw him he really was falling to the back and had minimal reactive strategies to arrest the fall, requiring max A to stop the fall.  We have talked at length about his safety, resting, taking his time and making sure he feels steady before moving, we have gone over the exercises at home and at the gym, I do feel that he understands this OBJECTIVE IMPAIRMENTS: Abnormal gait, decreased balance, decreased coordination, and difficulty walking.   ACTIVITY LIMITATIONS: squatting, stairs, transfers, and locomotion level  REHAB POTENTIAL: Fair dependent on carry over and patient compliance  CLINICAL DECISION MAKING: Stable/uncomplicated  EVALUATION COMPLEXITY: Low  GOALS: Goals reviewed with patient? Yes  SHORT TERM GOALS: Target date: 04/06/22  Patient will be independent with initial HEP. Goal status: Met 03/07/22  2.  Patient will be educated on strategies to decrease risk of falls.  Goal status: 03/07/22   LONG TERM  GOALS: Target date: 05/18/22  Patient will be independent with advanced/ongoing HEP to improve outcomes and carryover.  Goal status: Met  2.  Patient will demonstrate decreased fall risk by scoring < 12 sec on TUG. Baseline: 16s Goal status: met 05/18/22  3.  Patient will demonstrate improved functional LE strength as demonstrated by < 15s on 5xSTS. Baseline: 20s Goal status: Met 11.09 03/25/22  4.  Patient will score 42 on Berg Balance test to demonstrate lower risk of falls. (MCID= 8 points) .  Baseline: 36/56 Goal status: Met 42/56 04/05/22  5.  Patient will report no falls in a 4 weeks period. Baseline: falls every few days Goal status: On going  had a fall week of 04/18/22, another fall 05/04/22  PLAN:  PT FREQUENCY: 2x/week  PT DURATION: 12 weeks  PLANNED INTERVENTIONS: Therapeutic exercises, Therapeutic activity, Neuromuscular re-education, Balance training, Gait training, Patient/Family education, Self Care, Joint mobilization, Stair training, Cryotherapy, Moist heat, and Manual therapy.  PLAN FOR NEXT SESSION: D/C most goals met and made great progress, he will have toe surgery in the next few weeks and we spoke that if he regresses he will need to see the MD to return   Sumner Boast, PT 05/18/2022, 8:47 AM

## 2022-05-19 ENCOUNTER — Other Ambulatory Visit: Payer: Self-pay

## 2022-05-19 DIAGNOSIS — I480 Paroxysmal atrial fibrillation: Secondary | ICD-10-CM

## 2022-05-19 MED ORDER — FLECAINIDE ACETATE 100 MG PO TABS: 100.0000 mg | ORAL_TABLET | Freq: Two times a day (BID) | ORAL | 1 refills | Status: DC

## 2022-05-19 NOTE — Telephone Encounter (Signed)
From patient

## 2022-05-19 NOTE — Telephone Encounter (Signed)
Please send this refill

## 2022-05-24 ENCOUNTER — Encounter: Payer: Self-pay | Admitting: Cardiology

## 2022-05-24 NOTE — Telephone Encounter (Signed)
From pt

## 2022-05-30 ENCOUNTER — Encounter: Payer: Self-pay | Admitting: Internal Medicine

## 2022-06-02 ENCOUNTER — Other Ambulatory Visit: Payer: Self-pay | Admitting: Podiatry

## 2022-06-02 ENCOUNTER — Encounter: Payer: Self-pay | Admitting: Podiatry

## 2022-06-02 DIAGNOSIS — M21541 Acquired clubfoot, right foot: Secondary | ICD-10-CM | POA: Diagnosis not present

## 2022-06-02 DIAGNOSIS — M2041 Other hammer toe(s) (acquired), right foot: Secondary | ICD-10-CM

## 2022-06-02 MED ORDER — IBUPROFEN 800 MG PO TABS: 800.0000 mg | ORAL_TABLET | Freq: Three times a day (TID) | ORAL | 1 refills | Status: DC

## 2022-06-02 MED ORDER — OXYCODONE-ACETAMINOPHEN 5-325 MG PO TABS: 1.0000 | ORAL_TABLET | ORAL | 0 refills | Status: DC | PRN

## 2022-06-02 NOTE — Progress Notes (Signed)
PRN postop 

## 2022-06-08 ENCOUNTER — Ambulatory Visit (INDEPENDENT_AMBULATORY_CARE_PROVIDER_SITE_OTHER): Payer: Medicare Other | Admitting: Podiatry

## 2022-06-08 ENCOUNTER — Ambulatory Visit (INDEPENDENT_AMBULATORY_CARE_PROVIDER_SITE_OTHER): Payer: Medicare Other

## 2022-06-08 DIAGNOSIS — Z9889 Other specified postprocedural states: Secondary | ICD-10-CM | POA: Diagnosis not present

## 2022-06-08 NOTE — Progress Notes (Signed)
No chief complaint on file.   Subjective:  Patient presents today status post right great toe arthrodesis with hammertoe arthroplasty of the second digit right foot.  DOS: 06/02/2022.  Patient doing well.  No pain.  He is WBAT cam boot with the assistance of a walker.  Dressings are clean dry and intact  Past Medical History:  Diagnosis Date   Acquired clawfoot, right foot 12/16/2021   Anxiety    Atrophy of calf muscles 10/21/2021   Duplex calves was negative for significant blockage [facility] lumbar spine [facility] at [facility]   Carotid artery occlusion    COPD (chronic obstructive pulmonary disease) (HCC)    Depression    Dysrhythmia    PAF, atrial tachycardia   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   The spinal cord appears normal. 2.   No spinal stenosis. 3.   Mild multilevel degenerative changes as detailed above that do not lead to spinal stenosis or nerve root compression. 4.   T2 hyperintense foci within the pons consistent with chronic microvascular ischemic changes.   Loop - Medtronic Linq 10/22/2020 10/22/2020   Nocturia    Peripheral vascular disease (HCC)    Sleep apnea    on 6 cm, nasal pillow   Stroke ([place]) 05/01/2011   ischemic   Weakness generalized 10/21/2021   Severe, legs and arms    Past Surgical History:  Procedure Laterality Date   CAROTID ENDARTERECTOMY  12/09/11   Left cea   ENDARTERECTOMY  12/09/2011   Procedure: ENDARTERECTOMY CAROTID;  Surgeon: Rosetta Posner, MD;  Location: [facility];  Service: Vascular;  Laterality: Left;   EYE SURGERY     rt retina detachment   HAMMER TOE SURGERY  2004   HERNIA REPAIR  2006   SHOULDER ARTHROSCOPY W/ ROTATOR CUFF REPAIR  2010   TONSILLECTOMY      Allergies  Allergen  Reactions   Sulfamethoxazole-Trimethoprim Nausea And Vomiting    Objective/Physical Exam Neurovascular status intact.  Incision well coapted with staples intact. No sign of infectious process noted. No dehiscence. No active bleeding noted.  Moderate edema noted to the surgical extremity.  Radiographic Exam RT foot 06/08/2022:  Arthrodesis of the right great toe joint with orthopedic screw and percutaneous pin fixation noted.  There is also percutaneous pin noted to the second digit with arthroplasty of the head of the proximal phalanx.  Overall decent alignment and orthopedic screws to the great toe and second metatarsal head are stable  Assessment: 1. s/p right great toe arthrodesis with hammertoe arthroplasty and Weil shortening osteotomy right foot. DOS: 06/02/2022   Plan of Care:  1. Patient was evaluated. X-rays reviewed 2.  Decision made today to remove the percutaneous fixation pin to the great toe to ensure that it does not bend and get stuck distal to the orthopedic screw.  The percutaneous pin was removed.  Percutaneous pin to the second digit was left intact 3.  Dressings changed.  Clean dry and intact x 1 week 4.  Continue WBAT cam boot 5.  Return to clinic 1 week for possible staple removal   Edrick Kins, [place]  Dr. Edrick Kins, DPM    [address], [place] [address]                Office [phone]  Fax [phone]

## 2022-06-15 ENCOUNTER — Ambulatory Visit (INDEPENDENT_AMBULATORY_CARE_PROVIDER_SITE_OTHER): Payer: Medicare Other | Admitting: Podiatry

## 2022-06-15 DIAGNOSIS — Z9889 Other specified postprocedural states: Secondary | ICD-10-CM

## 2022-06-15 NOTE — Progress Notes (Signed)
No chief complaint on file.   Subjective:  Patient presents today status post right great toe arthrodesis with hammertoe arthroplasty of the second digit right foot.  DOS: 06/02/2022.  Patient continue to do well.  WBAT cam boot.  No new complaints at this time  Past Medical History:  Diagnosis Date   Acquired clawfoot, right foot 12/16/2021   Anxiety    Atrophy of calf muscles 10/21/2021   Duplex calves was negative for significant blockage [facility] lumbar spine was [facility] at [facility]   Carotid artery occlusion    COPD (chronic obstructive pulmonary disease) (HCC)    Depression    Dysrhythmia    PAF, atrial tachycardia   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   The spinal cord appears normal. 2.   No spinal stenosis. 3.   Mild multilevel degenerative changes as detailed above that do not lead to spinal stenosis or nerve root compression. 4.   T2 hyperintense foci within the pons consistent with chronic microvascular ischemic changes.   Loop - Medtronic Linq 10/22/2020 10/22/2020   Nocturia    Peripheral vascular disease (HCC)    Sleep apnea    on 6 cm, nasal pillow   Stroke ([place]) 05/01/2011   ischemic   Weakness generalized 10/21/2021   Severe, legs and arms    Past Surgical History:  Procedure Laterality Date   CAROTID ENDARTERECTOMY  12/09/11   Left cea   ENDARTERECTOMY  12/09/2011   Procedure: ENDARTERECTOMY CAROTID;  Surgeon: Rosetta Posner, MD;  Location: [facility];  Service: Vascular;  Laterality: Left;   EYE SURGERY     rt retina detachment   HAMMER TOE SURGERY  2004   HERNIA REPAIR  2006   SHOULDER ARTHROSCOPY W/ ROTATOR CUFF REPAIR  2010   TONSILLECTOMY      Allergies  Allergen Reactions   Sulfamethoxazole-Trimethoprim  Nausea And Vomiting    Objective/Physical Exam Neurovascular status intact.  Incision well coapted with staples intact. No sign of infectious process noted. No dehiscence. No active bleeding noted.  Minimal edema noted to the surgical extremity.  Radiographic Exam RT foot 06/08/2022:  Arthrodesis of the right great toe joint with orthopedic screw and percutaneous pin fixation noted.  There is also percutaneous pin noted to the second digit with arthroplasty of the head of the proximal phalanx.  Overall decent alignment and orthopedic screws to the great toe and second metatarsal head are stable  Assessment: 1. s/p right great toe arthrodesis with hammertoe arthroplasty and Weil shortening osteotomy right foot. DOS: 06/02/2022   Plan of Care:  1. Patient was evaluated.  Staples removed as well as the percutaneous fixation pin to the toe. 2.  Postsurgical shoe dispensed.  WBAT.  Discontinue cam boot 3.  Patient may begin washing and showering and getting the foot wet 4.  Return to clinic 2 weeks for follow-up x-ray   Edrick Kins, DPM [facility]  Dr. Edrick Kins, DPM    [address]  [place], [place] [address]                Office [phone]  Fax [phone]

## 2022-06-22 ENCOUNTER — Telehealth: Payer: Self-pay

## 2022-06-22 NOTE — Telephone Encounter (Signed)
Spoke with patient. Answered questions. He has an appt this upcoming Wednesday.  Thanks, Dr. Amalia Hailey

## 2022-06-23 NOTE — Telephone Encounter (Signed)
Noted, thanks!

## 2022-06-29 ENCOUNTER — Ambulatory Visit (INDEPENDENT_AMBULATORY_CARE_PROVIDER_SITE_OTHER): Payer: Medicare Other | Admitting: Podiatry

## 2022-06-29 ENCOUNTER — Ambulatory Visit (INDEPENDENT_AMBULATORY_CARE_PROVIDER_SITE_OTHER): Payer: Medicare Other

## 2022-06-29 DIAGNOSIS — M2041 Other hammer toe(s) (acquired), right foot: Secondary | ICD-10-CM

## 2022-06-29 DIAGNOSIS — Z9889 Other specified postprocedural states: Secondary | ICD-10-CM

## 2022-06-29 DIAGNOSIS — M2042 Other hammer toe(s) (acquired), left foot: Secondary | ICD-10-CM

## 2022-06-29 NOTE — Progress Notes (Signed)
Chief Complaint  Patient presents with   Routine Post Op    POV # 3 DOS 06/02/22 --- RIGHT GREAT TOE ARTHRODESIS WITH CAPSULOTMY, RIGHT SECOND TOE ARTHROPLASTY WITH CAPSULOTOMY, patient denies any N/V/F/C/SOB, patient denies any pain, patient is having some swelling     Subjective:  Patient presents today status post right great toe arthrodesis with hammertoe arthroplasty of the second digit right foot.  DOS: 06/02/2022.  Patient continue to do well.  WBAT cam boot.  No new complaints at this time  Past Medical History:  Diagnosis Date   Acquired clawfoot, right foot 12/16/2021   Anxiety    Atrophy of calf muscles 10/21/2021   Duplex calves was negative for significant blockage [facility]   Carotid artery occlusion    COPD (chronic obstructive pulmonary disease) (HCC)    Depression    Dysrhythmia    PAF, atrial tachycardia   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   The spinal cord appears normal. 2.   No spinal stenosis. 3.   Mild multilevel degenerative changes as detailed above that do not lead to spinal stenosis or nerve root compression. 4.   T2 hyperintense foci within the pons consistent with chronic microvascular ischemic changes.   Loop - Medtronic Linq 10/22/2020 10/22/2020   Nocturia    Peripheral vascular disease (HCC)    Sleep apnea    on 6 cm, nasal pillow   Stroke ([place]) 05/01/2011   ischemic   Weakness generalized 10/21/2021   Severe, legs and arms    Past Surgical History:  Procedure Laterality Date   CAROTID ENDARTERECTOMY  12/09/11   Left cea   ENDARTERECTOMY  12/09/2011   Procedure: ENDARTERECTOMY CAROTID;  Surgeon: Rosetta Posner, MD;  Location: [facility];  Service: Vascular;   Laterality: Left;   EYE SURGERY     rt retina detachment   HAMMER TOE SURGERY  2004   HERNIA REPAIR  2006   SHOULDER ARTHROSCOPY W/ ROTATOR CUFF REPAIR  2010   TONSILLECTOMY      Allergies  Allergen Reactions   Sulfamethoxazole-Trimethoprim Nausea And Vomiting    Objective/Physical Exam Neurovascular status intact.  Incisions nicely healed.  No edema noted.  Well-healing surgical foot overall.  There are two remaining skin staples along the second toe incision site.  Radiographic Exam RT foot 06/29/2022:  Arthrodesis of the right great toe joint with orthopedic screw and percutaneous pin fixation noted.  There is also percutaneous pin noted to the second digit with arthroplasty of the head of the proximal phalanx.  Overall decent alignment and orthopedic screws to the great toe and second metatarsal head are stable  Assessment: 1. s/p right great toe arthrodesis with hammertoe arthroplasty and Weil shortening osteotomy right foot. DOS: 06/02/2022   Plan of Care:  1. Patient was evaluated.  The remaining 2 staples were removed from the second toe incision site 2.  X-rays reviewed.  Overall well-healing surgical foot 3.  Patient may discontinue the postsurgical shoe 4.  Recommend good supportive tennis shoes and sneakers.  Slowly increase activity over the next 4 to 6 weeks 5.  Return to clinic as needed  Edrick Kins, DPM [facility]  Dr. Edrick Kins, DPM    [address], [place] [address]                Office [phone]  Fax [phone]

## 2022-07-05 ENCOUNTER — Emergency Department (HOSPITAL_COMMUNITY): Payer: Medicare Other

## 2022-07-05 ENCOUNTER — Emergency Department (HOSPITAL_COMMUNITY)
Admission: EM | Admit: 2022-07-05 | Discharge: 2022-07-05 | Disposition: A | Discharge status: Home / Self Care | Payer: Medicare Other | Source: Home / Self Care | Attending: Emergency Medicine | Admitting: Emergency Medicine

## 2022-07-05 ENCOUNTER — Other Ambulatory Visit: Payer: Self-pay

## 2022-07-05 ENCOUNTER — Encounter (HOSPITAL_COMMUNITY): Payer: Self-pay | Admitting: Emergency Medicine

## 2022-07-05 DIAGNOSIS — M25561 Pain in right knee: Secondary | ICD-10-CM | POA: Insufficient documentation

## 2022-07-05 DIAGNOSIS — W108XXA Fall (on) (from) other stairs and steps, initial encounter: Secondary | ICD-10-CM | POA: Insufficient documentation

## 2022-07-05 DIAGNOSIS — W19XXXA Unspecified fall, initial encounter: Secondary | ICD-10-CM

## 2022-07-05 DIAGNOSIS — M25461 Effusion, right knee: Secondary | ICD-10-CM | POA: Insufficient documentation

## 2022-07-05 DIAGNOSIS — S76111A Strain of right quadriceps muscle, fascia and tendon, initial encounter: Secondary | ICD-10-CM | POA: Diagnosis not present

## 2022-07-05 MED ORDER — ACETAMINOPHEN 500 MG PO TABS
1000.0000 mg | ORAL_TABLET | Freq: Once | ORAL | Status: AC
  Administered 2022-07-05: 1000 mg via ORAL
  Filled 2022-07-05: qty 2

## 2022-07-05 NOTE — ED Triage Notes (Signed)
Patient presents from home due to a fall today. He fell down a few steps and hit his head. Post fall he complains of right knee/ thigh pain and an abrasion to the right elbow. He admits to having some mobility issues over the past 2-3 years. He did not lose consciousness and did not hit his head.    EMS vitals:  80 HR 146/70 BP 17 RR

## 2022-07-05 NOTE — ED Notes (Signed)
Ambulated pt with crutches, pt has a difficult time with the crutches and fear they will fall while using them due to the pain in their leg.

## 2022-07-05 NOTE — ED Provider Notes (Signed)
[place] EMERGENCY DEPARTMENT AT [facility] Provider Note   CSN: [id number] Arrival date & time: 07/05/22  1514     History Chief Complaint  Patient presents with   Fall   Leg Pain    HPI Brandon Mendoza is a 82 y.o. male presenting for fall down stairs. States he lost his balance going up the stairs and just fell over backwards.  Went down approximately 5 or 6 stairs.  Denies any symptoms other than right knee pain.  Denies headache denies injuries.  It has been approximately 4 hours since the fall. Not able to ambulate due to right knee pain.  Substantial right knee swelling identified on exam.  Had a joint injection yesterday.  Not on blood thinners.   Patient's recorded medical, surgical, social, medication list and allergies were reviewed in the Snapshot window as part of the initial history.   Review of Systems   Review of Systems  Constitutional:  Negative for chills and fever.  HENT:  Negative for ear pain and sore throat.   Eyes:  Negative for pain and visual disturbance.  Respiratory:  Negative for cough and shortness of breath.   Cardiovascular:  Negative for chest pain and palpitations.  Gastrointestinal:  Negative for abdominal pain and vomiting.  Genitourinary:  Negative for dysuria and hematuria.  Musculoskeletal:  Positive for gait problem and joint swelling. Negative for arthralgias and back pain.  Skin:  Negative for color change and rash.  Neurological:  Negative for seizures and syncope.  All other systems reviewed and are negative.   Physical Exam Updated Vital Signs BP (!) 145/80 (BP Location: Left Arm)   Pulse 81   Temp 98.6 F (37 C) (Oral)   Resp 18   SpO2 97%  Physical Exam Vitals and nursing note reviewed.  Constitutional:      General: He is not in acute distress.    Appearance: He is well-developed.  HENT:     Head: Normocephalic and atraumatic.  Eyes:     Conjunctiva/sclera: Conjunctivae normal.  Cardiovascular:     Rate  and Rhythm: Normal rate and regular rhythm.     Heart sounds: No murmur heard. Pulmonary:     Effort: Pulmonary effort is normal. No respiratory distress.     Breath sounds: Normal breath sounds.  Abdominal:     Palpations: Abdomen is soft.     Tenderness: There is no abdominal tenderness.  Musculoskeletal:        General: Swelling and tenderness (Substantial right knee swelling.) present.     Cervical back: Neck supple.  Skin:    General: Skin is warm and dry.     Capillary Refill: Capillary refill takes less than 2 seconds.  Neurological:     Mental Status: He is alert.  Psychiatric:        Mood and Affect: Mood normal.      ED Course/ Medical Decision Making/ A&P    Procedures Procedures   Medications Ordered in ED Medications  acetaminophen (TYLENOL) tablet 1,000 mg (1,000 mg Oral Given 07/05/22 1639)   Medical Decision Making:    MARTINA GIBBY is a 82 y.o. male who presented to the ED today with a moderate mechanisma trauma, detailed above.    Patient placed on continuous vitals and telemetry monitoring while in ED which was reviewed periodically.   Given this mechanism of trauma, a full physical exam was performed. Notably, patient was HDS in NAD.   Reviewed and confirmed nursing documentation  for past medical history, family history, social history.    Initial Assessment/Plan:   This is a patient presenting with a moderate mechanism trauma.  As such, I have considered intracranial injuries including intracranial hemorrhage, intrathoracic injuries including blunt myocardial or blunt lung injury, blunt abdominal injuries including aortic dissection, bladder injury, spleen injury, liver injury and I have considered orthopedic injuries including extremity or spinal injury.  With the patient's presentation of moderate mechanism trauma but an otherwise reassuring exam, patient warrants targeted evaluation for potential traumatic injuries. Will proceed with targeted  evaluation for potential injuries. Will proceed with right knee XR/CTH. Objective evaluation resulted with no acute pathology.    Final Reassessment and Plan:   On reassessment, patient was able to ambulate with crutches for balance.  He does have substantial soft tissue swelling.  I recommended nonweightbearing status to this extremity until swelling improves/she is seen by his orthopedic.  He states that he will plan to follow-up with his orthopedic in a.m.  I suspect ligamentous injury is most likely diagnosis though occult fracture does remain in the differential.  Regardless I placed patient in knee immobilizer and we discussed ongoing management.  We discussed need for possible acute rehab given his severe pain and ambulation challenges including multiple falls at home.  Declined this service.  States he does not want to stay to be evaluated for rehab placement.  He would like to go home and follow-up with his orthopedic.  He felt okay with how his ambulation trial went he states he would be safe at home to get to the bathroom with his walkers and wheelchair at home.  He states his family will provide social support and he does not need any further support at this time.  Given lack of acute findings on exam, family will come pick him up.  Patient stable for outpatient care management with strict return precautions.  He does not want anything more potent for pain and feels this can be managed in the outpatient setting.  Discussed risk for repeat falls and importance of returning if he does not feel safe and patient expressed understanding.      Clinical Impression:  1. Fall, initial encounter      Discharge   Final Clinical Impression(s) / ED Diagnoses Final diagnoses:  Fall, initial encounter    Rx / DC Orders ED Discharge Orders     None         Glyn Ade, MD 07/05/22 [phone]

## 2022-07-05 NOTE — ED Notes (Signed)
Patient given discharge instructions and follow up care. Patient verbalized understanding. Patient taken out of ED via wheelchair.  

## 2022-07-06 ENCOUNTER — Telehealth: Payer: Self-pay | Admitting: Internal Medicine

## 2022-07-06 ENCOUNTER — Encounter: Payer: Self-pay | Admitting: Internal Medicine

## 2022-07-06 NOTE — Telephone Encounter (Signed)
Patient requesting Referral to [facility] due to ED visit. Declined Office Visit. Please advise.

## 2022-07-07 ENCOUNTER — Other Ambulatory Visit: Payer: Self-pay

## 2022-07-07 DIAGNOSIS — M79604 Pain in right leg: Secondary | ICD-10-CM

## 2022-07-07 DIAGNOSIS — W19XXXD Unspecified fall, subsequent encounter: Secondary | ICD-10-CM

## 2022-07-07 NOTE — Telephone Encounter (Signed)
Spoke with patient concerning this and placed a referral to the requested facility.

## 2022-07-08 ENCOUNTER — Encounter: Payer: Self-pay | Admitting: Internal Medicine

## 2022-07-08 ENCOUNTER — Other Ambulatory Visit: Payer: Self-pay

## 2022-07-08 ENCOUNTER — Other Ambulatory Visit: Payer: Self-pay | Admitting: *Deleted

## 2022-07-08 ENCOUNTER — Other Ambulatory Visit: Payer: Self-pay | Admitting: Podiatry

## 2022-07-08 ENCOUNTER — Ambulatory Visit: Payer: Medicare Other | Admitting: Internal Medicine

## 2022-07-08 ENCOUNTER — Ambulatory Visit (INDEPENDENT_AMBULATORY_CARE_PROVIDER_SITE_OTHER): Payer: Medicare Other | Admitting: Internal Medicine

## 2022-07-08 ENCOUNTER — Inpatient Hospital Stay (HOSPITAL_COMMUNITY)
Admission: EM | Admit: 2022-07-08 | Discharge: 2022-07-15 | DRG: 501 | Disposition: A | Discharge status: Skilled Nursing Facility | Payer: Medicare Other | Attending: Internal Medicine | Admitting: Internal Medicine

## 2022-07-08 ENCOUNTER — Encounter (HOSPITAL_COMMUNITY): Payer: Self-pay

## 2022-07-08 VITALS — BP 105/65 | HR 101 | Temp 97.9°F | Ht 69.0 in

## 2022-07-08 DIAGNOSIS — W109XXA Fall (on) (from) unspecified stairs and steps, initial encounter: Secondary | ICD-10-CM | POA: Diagnosis present

## 2022-07-08 DIAGNOSIS — M21531 Acquired clawfoot, right foot: Secondary | ICD-10-CM | POA: Diagnosis present

## 2022-07-08 DIAGNOSIS — Y9301 Activity, walking, marching and hiking: Secondary | ICD-10-CM | POA: Diagnosis present

## 2022-07-08 DIAGNOSIS — Z86010 Personal history of colonic polyps: Secondary | ICD-10-CM | POA: Diagnosis not present

## 2022-07-08 DIAGNOSIS — M2041 Other hammer toe(s) (acquired), right foot: Secondary | ICD-10-CM

## 2022-07-08 DIAGNOSIS — D72829 Elevated white blood cell count, unspecified: Secondary | ICD-10-CM | POA: Diagnosis present

## 2022-07-08 DIAGNOSIS — F419 Anxiety disorder, unspecified: Secondary | ICD-10-CM | POA: Diagnosis present

## 2022-07-08 DIAGNOSIS — G8929 Other chronic pain: Secondary | ICD-10-CM | POA: Diagnosis present

## 2022-07-08 DIAGNOSIS — M1710 Unilateral primary osteoarthritis, unspecified knee: Secondary | ICD-10-CM | POA: Diagnosis present

## 2022-07-08 DIAGNOSIS — E871 Hypo-osmolality and hyponatremia: Secondary | ICD-10-CM | POA: Diagnosis present

## 2022-07-08 DIAGNOSIS — Z87891 Personal history of nicotine dependence: Secondary | ICD-10-CM | POA: Diagnosis not present

## 2022-07-08 DIAGNOSIS — R531 Weakness: Secondary | ICD-10-CM

## 2022-07-08 DIAGNOSIS — I10 Essential (primary) hypertension: Secondary | ICD-10-CM | POA: Diagnosis present

## 2022-07-08 DIAGNOSIS — I480 Paroxysmal atrial fibrillation: Secondary | ICD-10-CM | POA: Diagnosis present

## 2022-07-08 DIAGNOSIS — J449 Chronic obstructive pulmonary disease, unspecified: Secondary | ICD-10-CM | POA: Insufficient documentation

## 2022-07-08 DIAGNOSIS — E860 Dehydration: Secondary | ICD-10-CM | POA: Insufficient documentation

## 2022-07-08 DIAGNOSIS — R270 Ataxia, unspecified: Secondary | ICD-10-CM

## 2022-07-08 DIAGNOSIS — Z9181 History of falling: Secondary | ICD-10-CM

## 2022-07-08 DIAGNOSIS — S76111A Strain of right quadriceps muscle, fascia and tendon, initial encounter: Principal | ICD-10-CM

## 2022-07-08 DIAGNOSIS — Z789 Other specified health status: Secondary | ICD-10-CM | POA: Insufficient documentation

## 2022-07-08 DIAGNOSIS — Z8673 Personal history of transient ischemic attack (TIA), and cerebral infarction without residual deficits: Secondary | ICD-10-CM | POA: Diagnosis not present

## 2022-07-08 DIAGNOSIS — E782 Mixed hyperlipidemia: Secondary | ICD-10-CM | POA: Diagnosis present

## 2022-07-08 DIAGNOSIS — I739 Peripheral vascular disease, unspecified: Secondary | ICD-10-CM | POA: Diagnosis present

## 2022-07-08 DIAGNOSIS — I951 Orthostatic hypotension: Secondary | ICD-10-CM | POA: Diagnosis not present

## 2022-07-08 DIAGNOSIS — G4733 Obstructive sleep apnea (adult) (pediatric): Secondary | ICD-10-CM | POA: Diagnosis present

## 2022-07-08 DIAGNOSIS — R296 Repeated falls: Secondary | ICD-10-CM

## 2022-07-08 DIAGNOSIS — Y92009 Unspecified place in unspecified non-institutional (private) residence as the place of occurrence of the external cause: Secondary | ICD-10-CM | POA: Diagnosis not present

## 2022-07-08 DIAGNOSIS — Z9889 Other specified postprocedural states: Secondary | ICD-10-CM

## 2022-07-08 DIAGNOSIS — G629 Polyneuropathy, unspecified: Secondary | ICD-10-CM | POA: Diagnosis present

## 2022-07-08 DIAGNOSIS — Z79899 Other long term (current) drug therapy: Secondary | ICD-10-CM

## 2022-07-08 DIAGNOSIS — Z882 Allergy status to sulfonamides status: Secondary | ICD-10-CM

## 2022-07-08 DIAGNOSIS — R262 Difficulty in walking, not elsewhere classified: Secondary | ICD-10-CM

## 2022-07-08 DIAGNOSIS — M25561 Pain in right knee: Secondary | ICD-10-CM | POA: Diagnosis present

## 2022-07-08 DIAGNOSIS — F32A Depression, unspecified: Secondary | ICD-10-CM | POA: Diagnosis present

## 2022-07-08 DIAGNOSIS — N179 Acute kidney failure, unspecified: Secondary | ICD-10-CM | POA: Diagnosis present

## 2022-07-08 DIAGNOSIS — Z7982 Long term (current) use of aspirin: Secondary | ICD-10-CM

## 2022-07-08 DIAGNOSIS — I6523 Occlusion and stenosis of bilateral carotid arteries: Secondary | ICD-10-CM

## 2022-07-08 DIAGNOSIS — M40209 Unspecified kyphosis, site unspecified: Secondary | ICD-10-CM | POA: Diagnosis present

## 2022-07-08 DIAGNOSIS — S76191D Other specified injury of right quadriceps muscle, fascia and tendon, subsequent encounter: Secondary | ICD-10-CM | POA: Diagnosis not present

## 2022-07-08 LAB — URINALYSIS, ROUTINE W REFLEX MICROSCOPIC
Bacteria, UA: NONE SEEN
Bilirubin Urine: NEGATIVE
Glucose, UA: NEGATIVE mg/dL
Ketones, ur: NEGATIVE mg/dL
Leukocytes,Ua: NEGATIVE
Nitrite: NEGATIVE
Protein, ur: NEGATIVE mg/dL
RBC / HPF: >50 RBC/hpf (ref 0–5)
Specific Gravity, Urine: 1.017 (ref 1.005–1.030)
pH: 5 (ref 5.0–8.0)

## 2022-07-08 LAB — CBC
HCT: 37.8 % — ABNORMAL LOW (ref 39.0–52.0)
Hemoglobin: 13 g/dL (ref 13.0–17.0)
MCH: 31 pg (ref 26.0–34.0)
MCHC: 34.4 g/dL (ref 30.0–36.0)
MCV: 90 fL (ref 80.0–100.0)
Platelets: 309 10*3/uL (ref 150–400)
RBC: 4.2 MIL/uL — ABNORMAL LOW (ref 4.22–5.81)
RDW: 13 % (ref 11.5–15.5)
WBC: 15.9 10*3/uL — ABNORMAL HIGH (ref 4.0–10.5)
nRBC: 0 % (ref 0.0–0.2)

## 2022-07-08 LAB — CBC WITH DIFFERENTIAL/PLATELET
Abs Immature Granulocytes: 0.24 10*3/uL — ABNORMAL HIGH (ref 0.00–0.07)
Basophils Absolute: 0.1 10*3/uL (ref 0.0–0.1)
Basophils Relative: 1 %
Eosinophils Absolute: 0.2 10*3/uL (ref 0.0–0.5)
Eosinophils Relative: 1 %
HCT: 39.3 % (ref 39.0–52.0)
Hemoglobin: 13.5 g/dL (ref 13.0–17.0)
Immature Granulocytes: 1 %
Lymphocytes Relative: 11 %
Lymphs Abs: 1.9 10*3/uL (ref 0.7–4.0)
MCH: 31.2 pg (ref 26.0–34.0)
MCHC: 34.4 g/dL (ref 30.0–36.0)
MCV: 90.8 fL (ref 80.0–100.0)
Monocytes Absolute: 2 10*3/uL — ABNORMAL HIGH (ref 0.1–1.0)
Monocytes Relative: 12 %
Neutro Abs: 13.2 10*3/uL — ABNORMAL HIGH (ref 1.7–7.7)
Neutrophils Relative %: 74 %
Platelets: 330 10*3/uL (ref 150–400)
RBC: 4.33 MIL/uL (ref 4.22–5.81)
RDW: 12.9 % (ref 11.5–15.5)
WBC: 17.6 10*3/uL — ABNORMAL HIGH (ref 4.0–10.5)
nRBC: 0 % (ref 0.0–0.2)

## 2022-07-08 LAB — COMPREHENSIVE METABOLIC PANEL
ALT: 20 U/L (ref 0–44)
AST: 20 U/L (ref 15–41)
Albumin: 3.9 g/dL (ref 3.5–5.0)
Alkaline Phosphatase: 74 U/L (ref 38–126)
Anion gap: 9 (ref 5–15)
BUN: 42 mg/dL — ABNORMAL HIGH (ref 8–23)
CO2: 22 mmol/L (ref 22–32)
Calcium: 8.7 mg/dL — ABNORMAL LOW (ref 8.9–10.3)
Chloride: 103 mmol/L (ref 98–111)
Creatinine, Ser: 1.52 mg/dL — ABNORMAL HIGH (ref 0.61–1.24)
GFR, Estimated: 45 mL/min — ABNORMAL LOW (ref >60)
Glucose, Bld: 119 mg/dL — ABNORMAL HIGH (ref 70–99)
Potassium: 4.1 mmol/L (ref 3.5–5.1)
Sodium: 134 mmol/L — ABNORMAL LOW (ref 135–145)
Total Bilirubin: 0.7 mg/dL (ref 0.3–1.2)
Total Protein: 7 g/dL (ref 6.5–8.1)

## 2022-07-08 LAB — CK: Total CK: 139 U/L (ref 49–397)

## 2022-07-08 LAB — CREATININE, SERUM
Creatinine, Ser: 1.58 mg/dL — ABNORMAL HIGH (ref 0.61–1.24)
GFR, Estimated: 43 mL/min — ABNORMAL LOW (ref >60)

## 2022-07-08 MED ORDER — ONDANSETRON HCL 4 MG PO TABS: 4.0000 mg | ORAL_TABLET | Freq: Four times a day (QID) | ORAL | Status: DC | PRN

## 2022-07-08 MED ORDER — ACETAMINOPHEN 325 MG PO TABS
650.0000 mg | ORAL_TABLET | Freq: Once | ORAL | Status: AC
  Administered 2022-07-08: 650 mg via ORAL
  Filled 2022-07-08: qty 2

## 2022-07-08 MED ORDER — ACETAMINOPHEN 650 MG RE SUPP: 650.0000 mg | Freq: Four times a day (QID) | RECTAL | Status: DC | PRN

## 2022-07-08 MED ORDER — MORPHINE SULFATE (PF) 4 MG/ML IV SOLN: 4.0000 mg | Freq: Once | INTRAVENOUS | Status: DC

## 2022-07-08 MED ORDER — ENOXAPARIN SODIUM 40 MG/0.4ML IJ SOSY: 40.0000 mg | PREFILLED_SYRINGE | INTRAMUSCULAR | Status: DC

## 2022-07-08 MED ORDER — ACETAMINOPHEN 325 MG PO TABS: 650.0000 mg | ORAL_TABLET | Freq: Four times a day (QID) | ORAL | Status: DC | PRN

## 2022-07-08 MED ORDER — ONDANSETRON HCL 4 MG/2ML IJ SOLN: 4.0000 mg | Freq: Four times a day (QID) | INTRAMUSCULAR | Status: DC | PRN

## 2022-07-08 MED ORDER — SODIUM CHLORIDE 0.9 % IV BOLUS
1000.0000 mL | Freq: Once | INTRAVENOUS | Status: AC
  Administered 2022-07-08: 1000 mL via INTRAVENOUS

## 2022-07-08 NOTE — ED Provider Notes (Signed)
[place] EMERGENCY DEPARTMENT AT [facility] Provider Note   CSN: [id number] Arrival date & time: 07/08/22  1544     History  Chief Complaint  Patient presents with   Brandon Mendoza is a 82 y.o. male history of chronic pain pain medicine, hypertension, here presenting with inability to walk.  Patient fell several days ago and was seen in the ER.  X-rays did not show any fracture.  Patient states that he was sent home with knee immobilizer and he was unable to walk since then.  Patient states that he needs rehab.  He apparently refused rehab at that time.  The history is provided by the patient.       Home Medications Prior to Admission medications   Medication Sig Start Date End Date Taking? Authorizing Provider  albuterol (VENTOLIN HFA) 108 (90 Base) MCG/ACT inhaler Inhale 1-2 puffs into the lungs every 6 (six) hours as needed for wheezing or shortness of breath. Patient not taking: Reported on 07/08/2022 05/05/22   Wallis Bamberg, PA-C  ALPRAZolam Prudy Feeler) 0.25 MG tablet 1 tablet (0.25 mg total) by Per NG tube route daily as needed for anxiety. Patient not taking: Reported on 07/08/2022 12/22/21   Lula Olszewski, MD  aspirin 81 MG chewable tablet Chew 1 tablet by mouth daily.    [provider]  DULoxetine (CYMBALTA) 30 MG capsule Take 30 mg by mouth daily.    [provider]  flecainide (TAMBOCOR) 100 MG tablet Take 1 tablet (100 mg total) by mouth 2 (two) times daily. 05/19/22   Yates Decamp, MD  ibuprofen (ADVIL) 800 MG tablet Take 1 tablet (800 mg total) by mouth 3 (three) times daily. Patient not taking: Reported on 07/08/2022 06/02/22   Felecia Shelling, DPM  losartan-hydrochlorothiazide [facility]) 50-12.5 MG tablet Take 1 tablet by mouth every morning. 08/17/21   Yates Decamp, MD  meclizine (ANTIVERT) 12.5 MG tablet as needed. As needed. Patient not taking: Reported on 07/08/2022 11/02/21   [provider]  meloxicam (MOBIC) 15 MG tablet Take 15  mg by mouth daily as needed. As needed. Patient not taking: Reported on 07/08/2022 02/10/22   [provider]  MULTIPLE VITAMIN PO Take 1 tablet by mouth daily.    [provider]  oxyCODONE-acetaminophen (PERCOCET) 5-325 MG tablet Take 1 tablet by mouth every 4 (four) hours as needed for severe pain. Patient not taking: Reported on 07/08/2022 06/02/22   Felecia Shelling, DPM  rosuvastatin (CRESTOR) 20 MG tablet Take 0.5 tablets (10 mg total) by mouth at bedtime. 03/01/22   Lula Olszewski, MD  sertraline (ZOLOFT) 50 MG tablet Take 1 tablet (50 mg total) by mouth daily. Use to taper off sertraline slowly over several months, if tolerated. Be aware anxiety and irritability may increase and you might have to stay on this 03/01/22 02/24/23  Lula Olszewski, MD      Allergies    Sulfamethoxazole-trimethoprim    Review of Systems   Review of Systems  Neurological:  Positive for weakness.  All other systems reviewed and are negative.   Physical Exam Updated Vital Signs BP (!) 150/84 (BP Location: Right Arm)   Pulse 92   Temp 98.9 F (37.2 C) (Oral)   Resp 15   Ht 5\' 9"  (1.753 m)   Wt 88 kg   SpO2 95%   BMI 28.65 kg/m  Physical Exam Vitals and nursing note reviewed.  Constitutional:      Appearance:  Normal appearance.     Comments: Chronic A[place] ill and uncomfortable  HENT:     Head: Normocephalic.     Nose: Nose normal.     Mouth/Throat:     Mouth: Mucous membranes are moist.  Eyes:     Extraocular Movements: Extraocular movements intact.     Pupils: Pupils are equal, round, and reactive to light.  Cardiovascular:     Rate and Rhythm: Normal rate.     Pulses: Normal pulses.     Heart sounds: Normal heart sounds.  Pulmonary:     Effort: Pulmonary effort is normal.     Breath sounds: Normal breath sounds.  Abdominal:     General: Abdomen is flat.     Palpations: Abdomen is soft.  Musculoskeletal:     Cervical back: Normal range of motion and neck supple.      Comments: Right knee immobilizer in place.  Patient has abrasion on the right knee but no obvious knee effusion.  Patient is unable to bear weight on both legs.  Skin:    General: Skin is warm.     Capillary Refill: Capillary refill takes less than 2 seconds.  Neurological:     General: No focal deficit present.     Mental Status: He is alert and oriented to person, place, and time.  Psychiatric:        Mood and Affect: Mood normal.        Behavior: Behavior normal.     ED Results / Procedures / Treatments   Labs (all labs ordered are listed, but only abnormal results are displayed) Labs Reviewed - No data to display  EKG None  Radiology DG Foot Complete Right  Result Date: 07/08/2022 Please see detailed radiograph report in office note.   Procedures Procedures    Medications Ordered in ED Medications - No data to display  ED Course/ Medical Decision Making/ A&P                             Medical Decision Making [name] is a 82 y.o. male here presenting with weakness.  Patient has history of chronic pain and patient is unable to walk after his fall.  He was appropriately evaluated several days ago.  Will consult PT regarding placement.  Patient may benefit from rehab.  Will also consult social work  6:11 PM Patient has mild AKI.  Patient is able to count is 17.  Social work discussed with him regarding rehab versus home health.  Patient does not want to go home and states that he needs rehab.  He is still in severe pain despite pain meds.  Given IV fluids and will admit for mild AKI, pain control and PT consult.  Problems Addressed: AKI (acute kidney injury): acute illness or injury  Amount and/or Complexity of Data Reviewed Labs: ordered. Decision-making details documented in ED Course.  Risk OTC drugs. Prescription drug management. Decision regarding hospitalization.    Final Clinical Impression(s) / ED Diagnoses Final diagnoses:  None    Rx / DC  Orders ED Discharge Orders     None         Charlynne Pander, MD 07/08/22 [phone]

## 2022-07-08 NOTE — ED Notes (Signed)
Delay in pt going upstairs- nurse needs to be called in to the floor

## 2022-07-08 NOTE — Assessment & Plan Note (Addendum)
In my medical opinion, he is not safe to stay home due to not able to transfer or walk or stand or use crutches or do Activities of Daily Living (ADLs).  I support urgent placement and medicare waiver through emergency room.   I called [facility] facility who explained he would need waiver or 3 days stay from emergency room/hospital prior to placement for medicare to cover.   I therefore developed the following plan and printed it for him:   Return to [facility] emergency room Ask about a waiver for rehab placement due to inability to stay safe and care for self at home.   Identify a rehab facility: Research rehab facilities that accept Medicare patients and have openings. Having a preferred facility in mind can help expedite the process. At the emergency room:   Advise them you would prefer [facility].  Be clear and concise: Explain to the triage nurse and doctor your recent fall, inability to walk or care for yourself at home, and lack of support system. Express your desire for rehab to regain independence.   Focus on safety concerns: Emphasize the safety risks of returning home in your current condition. Mention any concerns about falling again or being unable to meet your basic needs.     - cannot walk   - cannot go to bathroom   - cannot obtain food for himself   - cannot bathe   - cannot wipe rear   - cannot even sit on toilet- due to can't put any weight on R leg   - cannot put any weight on right leg which has ligament injury due to recent fall    - cannot use crutches because even his good leg isn't reliable-the reason fell in first place is legs unstable even prior to right leg injury  Be polite but persistent: Explain you've researched rehab options and understand a short hospital stay might be necessary for a waiver.  Don't downplay your condition: Be honest about your limitations and the challenges of going home. Maintain a calm and cooperative demeanor: This will make  you appear more reliable and easier to work with.  Ask questions: If the doctor suggests alternatives, inquire about the reasons and express your preference for rehab if appropriate.  Important to Note: The decision to admit you to the hospital or grant a waiver ultimately rests with the doctor based on medical necessity. There is no guaranteed way to secure a waiver, but presenting your situation clearly and advocating for your needs can increase your chances.  https://www.morris-vasquez.com/: SemiCheap.at This page explains Medicare coverage for skilled nursing facilities (rehab). [employer] on Aging: TLaunch.is This website offers resources on senior care options.  Remember, your health and safety are the priority. Don't hesitate to seek help, and hopefully, this information helps you navigate the process towards a successful rehab placement.

## 2022-07-08 NOTE — Care Management (Addendum)
Transition of Care [facility]) - Emergency Department Mini Assessment   Patient Details  Name: Brandon Mendoza MRN: [id number] Date of Birth: 05-Sep-1940  [facility]) CM/SW Contact:    Lavenia Atlas, RN Phone Number: 07/08/2022, 6:25 PM   Clinical Narrative: Received call from [facility] SW indicating patient not eligible for STR/SNF placement and possibly needs [facility] services. Per chart review patient was recently seen at [facility] ED on 07/05/22. Patient does not qualify for STR/ SNF placement due to his insurance requires 3 midnight stay. This RNCM spoke with patient at bedside to offer [facility] choice, patient declined. Patient reports his wife is 86 years old and can not help/ take care of him. Patient reports his PCP referred him to ED for short term SNF placement. Patient reports he does not have money to pay out of pocket for SNF. This RNCM explained the only waiver is if he has [facility] which he does not have, resulting in needing 3 midnight stay. Patient request to speak with EDP to get admitted for 3 midnights.   TOC following for needs.    ED Mini Assessment: What brought you to the Emergency Department? : c/o falls for 2 years  Barriers to Discharge: Continued Medical Work up  [employer] interventions: offered [facility] services     Interventions which prevented an admission or readmission: Home Health Consult or Services    Patient Designer, industrial/product        ,            CMS Medicare.gov Compare Post Acute Care list provided to:: Patient Choice offered to / list presented to : Patient  Admission diagnosis:  fall inj, reeval for rehab Patient Active Problem List   Diagnosis Date Noted   Unable to care for self 07/08/2022   Macular degeneration 03/01/2022   Myofascial pain 02/10/2022   Lumbar spondylosis 01/03/2022   Acquired clawfoot, left foot 12/16/2021   Acquired clawfoot, right foot 12/16/2021   Claw foot 12/14/2021   Gait abnormality 12/10/2021   Spinal stenosis at L4-L5  level 12/10/2021   Ataxia 11/22/2021   Lightheadedness 11/02/2021   Claudication of lower extremity 10/21/2021   Atrophy of calf muscles 10/21/2021   Weakness generalized 10/21/2021   Focal neurological deficit 10/21/2021   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   Pain in joint of right hip 09/14/2021   High arches 07/22/2021   Hammertoes of both feet 07/22/2021   Neuropathy 07/22/2021   Constipation 05/28/2021   Disequilibrium 05/28/2021   Personal history of colonic polyps 05/28/2021   Renal cyst 05/28/2021   Gastroesophageal reflux disease without esophagitis 05/05/2021   Ingrowing left great toenail 02/15/2021   S/P lumbar fusion 12/24/2020   Spinal stenosis of lumbar region 12/04/2020   Acquired complex renal cyst 11/23/2020   Functional gait abnormality 11/04/2020   Idiopathic neuropathy 11/02/2020   Tinea pedis of both feet 10/28/2020   Loop - [employer] Linq 10/22/2020 10/22/2020   Benign hypertension with CKD (chronic kidney disease) stage III 10/05/2020   Fall with injury 10/05/2020   Hematoma 09/16/2020   Normocytic anemia 09/14/2020   AF (paroxysmal atrial fibrillation) 09/14/2020   Chronic anticoagulation 09/14/2020   Hyponatremia 09/14/2020   Pain in left foot 01/22/2020   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   Pain in joint of right knee 12/27/2019   Recurrent falls 11/17/2019   Paroxysmal atrial fibrillation 10/22/2019   Pain in right foot 10/14/2019   Falls 10/07/2019   Ataxia involving  legs 08/12/2019   OSA on CPAP 06/20/2018   Urge incontinence 04/13/2018   Urinary urgency 04/13/2018   Pain in left knee 03/01/2018   Other intervertebral disc degeneration, lumbar region 06/16/2017   Acquired bilateral hammer toes 10/05/2015   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   Generalized anxiety disorder 05/12/2015   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 [place] [address] Phone: [phone] Fax: [phone]  [id number] - [place], [place] - [address] AT [address] [place] [address] Phone: [phone] Fax: [phone]

## 2022-07-08 NOTE — Progress Notes (Signed)
Brandon Mendoza [facility]: [phone]   Routine Medical Office Visit  Patient:  Brandon Mendoza      Age: 82 y.o.       Sex:  male  Date:   07/08/2022 PCP:    Lula Olszewski, MD   Today's Healthcare Provider: Lula Olszewski, MD   Assessment and Plan:   Unable to care for self Assessment & Plan: In my medical opinion, he is not safe to stay home due to not able to transfer or walk or stand or use crutches or do Activities of Daily Living (ADLs).  I support urgent placement and medicare waiver through emergency room.   I called [facility] facility who explained he would need waiver or 3 days stay from emergency room/hospital prior to placement for medicare to cover.   I therefore developed the following plan and printed it for him:   Return to [facility] emergency room Ask about a waiver for rehab placement due to inability to stay safe and care for self at home.   Identify a rehab facility: Research rehab facilities that accept Medicare patients and have openings. Having a preferred facility in mind can help expedite the process. At the emergency room:   Advise them you would prefer [facility].  Be clear and concise: Explain to the triage nurse and doctor your recent fall, inability to walk or care for yourself at home, and lack of support system. Express your desire for rehab to regain independence.   Focus on safety concerns: Emphasize the safety risks of returning home in your current condition. Mention any concerns about falling again or being unable to meet your basic needs.     - cannot walk   - cannot go to bathroom   - cannot obtain food for himself   - cannot bathe   - cannot wipe rear   - cannot even sit on toilet- due to can't put any weight on R leg   - cannot put any weight on right leg which has ligament injury due to recent fall    - cannot use crutches because even his good leg isn't reliable-the reason fell in first place is legs unstable even  prior to right leg injury  Be polite but persistent: Explain you've researched rehab options and understand a short hospital stay might be necessary for a waiver.  Don't downplay your condition: Be honest about your limitations and the challenges of going home. Maintain a calm and cooperative demeanor: This will make you appear more reliable and easier to work with.  Ask questions: If the doctor suggests alternatives, inquire about the reasons and express your preference for rehab if appropriate.  Important to Note: The decision to admit you to the hospital or grant a waiver ultimately rests with the doctor based on medical necessity. There is no guaranteed way to secure a waiver, but presenting your situation clearly and advocating for your needs can increase your chances.  [email or web address]-vasquez.[email or web address]: SemiCheap.at This page explains Medicare coverage for skilled nursing facilities (rehab). [employer] on Aging: TLaunch.is This website offers resources on senior care options.  Remember, your health and safety are the priority. Don't hesitate to seek help, and hopefully, this information helps you navigate the process towards a successful rehab placement.   Ataxia  Unable to walk       Clinical Presentation:   82 y.o. male here today for Nausea and Follow-up Brandon Mendoza to ED for a fall on 4/9. Unable to  walk due to right leg. Has to use wheelchair.)  Reviewed:  has a past medical history of Acquired clawfoot, right foot (12/16/2021), Anxiety, Atrophy of calf muscles (10/21/2021), Carotid artery occlusion, COPD (chronic obstructive pulmonary disease), Depression, Dysrhythmia, Focal neurological deficit (10/21/2021), colonic polyps, Hyperlipidemia, Hypertension, Kyphosis (10/21/2021), Loop - Medtronic Linq 10/22/2020 (10/22/2020), Nocturia, Peripheral vascular disease, Sleep apnea, Stroke (05/01/2011), and Weakness generalized  (10/21/2021). 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 04/10/2014   History of left-sided carotid endarterectomy 04/10/2014   Essential hypertension 04/10/2014   OSA on CPAP 06/20/2018   Normocytic anemia 09/14/2020   AF (paroxysmal atrial fibrillation) 09/14/2020   Chronic anticoagulation 09/14/2020   Hyponatremia 09/14/2020   Hematoma 09/16/2020   Spinal stenosis of lumbar region 12/04/2020   S/P lumbar fusion 12/24/2020   Ataxia involving legs 08/12/2019   Paroxysmal atrial fibrillation 10/22/2019   Loop - Medtronic Linq 10/22/2020 10/22/2020   Pain in joint of right knee 12/27/2019   Acquired bilateral hammer toes 10/05/2015   Acquired complex renal cyst 11/23/2020   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   Benign hypertension with CKD (chronic kidney disease) stage III 10/05/2020   Constipation 05/28/2021   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   Disequilibrium 05/28/2021   Fall with injury 10/05/2020   Falls 10/07/2019   Functional gait abnormality 11/04/2020   Gastroesophageal reflux disease without esophagitis 05/05/2021   Idiopathic neuropathy 11/02/2020   Ingrowing left great toenail 02/15/2021   Other intervertebral disc degeneration, lumbar region 06/16/2017   Pain in left foot 01/22/2020   Pain in left knee 03/01/2018   Pain in right foot 10/14/2019   Personal history of colonic polyps 05/28/2021   Recurrent falls 11/17/2019   Renal cyst 05/28/2021   Tinea pedis of both feet 10/28/2020   Urge incontinence 04/13/2018   Urinary urgency 04/13/2018   High arches 07/22/2021   Hammertoes of both feet 07/22/2021   Neuropathy 07/22/2021   Generalized anxiety disorder  05/12/2015   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   Claudication of lower extremity 10/21/2021   Atrophy of calf muscles 10/21/2021   Weakness generalized 10/21/2021   Focal neurological deficit 10/21/2021   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   Lightheadedness 11/02/2021   Ataxia 11/22/2021   Gait abnormality 12/10/2021   Spinal stenosis at L4-L5 level 12/10/2021   Claw foot 12/14/2021   Acquired clawfoot, left foot 12/16/2021   Acquired clawfoot, right foot 12/16/2021   Pain in joint of right hip 09/14/2021   Lumbar spondylosis 01/03/2022   Myofascial pain 02/10/2022   Macular degeneration 03/01/2022   Unable to care for self 07/08/2022   Resolved Ambulatory Problems    Diagnosis Date Noted   HTN (hypertension), malignant 12/01/2011   Aftercare following surgery of the circulatory system, NEC 03/13/2013   Numbness- Left neck 03/13/2013   Anxiety 04/10/2014   Thigh hematoma 09/14/2020   Leukocytosis 09/14/2020   AKI (acute kidney injury) 09/14/2020   Cervical strain, acute 04/04/2014   Change in bowel habit 05/28/2021   Chronic bilateral low back pain with bilateral sciatica 10/05/2020   Closed fracture of fifth metatarsal bone 02/07/2020   Diarrhea 05/28/2021   Contusion of right hip 10/02/2020  Medicare annual wellness visit, subsequent 11/19/2015   Right foot drop 10/02/2020   Strain of flexor muscle of right hip 07/10/2019   Past Medical History:  Diagnosis Date   Carotid artery occlusion    COPD (chronic obstructive pulmonary disease)    Depression    Dysrhythmia    Hx of colonic polyps    Hypertension    Nocturia    Peripheral vascular disease    Sleep apnea    Stroke 05/01/2011    Outpatient Medications Prior to Visit  Medication Sig   aspirin 81 MG chewable tablet Chew 1 tablet by mouth daily.   DULoxetine (CYMBALTA) 30 MG capsule Take 30 mg by mouth daily.   flecainide (TAMBOCOR) 100 MG tablet  Take 1 tablet (100 mg total) by mouth 2 (two) times daily.   losartan-hydrochlorothiazide (HYZAAR) 50-12.5 MG tablet Take 1 tablet by mouth every morning.   MULTIPLE VITAMIN PO Take 1 tablet by mouth daily.   rosuvastatin (CRESTOR) 20 MG tablet Take 0.5 tablets (10 mg total) by mouth at bedtime.   sertraline (ZOLOFT) 50 MG tablet Take 1 tablet (50 mg total) by mouth daily. Use to taper off sertraline slowly over several months, if tolerated. Be aware anxiety and irritability may increase and you might have to stay on this   albuterol (VENTOLIN HFA) 108 (90 Base) MCG/ACT inhaler Inhale 1-2 puffs into the lungs every 6 (six) hours as needed for wheezing or shortness of breath. (Patient not taking: Reported on 07/08/2022)   ALPRAZolam (XANAX) 0.25 MG tablet 1 tablet (0.25 mg total) by Per NG tube route daily as needed for anxiety. (Patient not taking: Reported on 07/08/2022)   ibuprofen (ADVIL) 800 MG tablet Take 1 tablet (800 mg total) by mouth 3 (three) times daily. (Patient not taking: Reported on 07/08/2022)   meclizine (ANTIVERT) 12.5 MG tablet as needed. As needed. (Patient not taking: Reported on 07/08/2022)   meloxicam (MOBIC) 15 MG tablet Take 15 mg by mouth daily as needed. As needed. (Patient not taking: Reported on 07/08/2022)   oxyCODONE-acetaminophen (PERCOCET) 5-325 MG tablet Take 1 tablet by mouth every 4 (four) hours as needed for severe pain. (Patient not taking: Reported on 07/08/2022)   No facility-administered medications prior to visit.    HPI  Updated and modified:  Problem  Unable to Care for Self    Reviewed the recent emergency room visit where he fell and he has a probably injured ligament in the right knee making him unstable to walk but he declined rehab placement due to he thought he would be able to take care of himself at home but he has not been having any improvement and has not been able to do his ADLs and his wife is elderly and frail and cannot help left.  He cannot do  transfers or stand.  I had the help him get out of his car to come into the office and back out to his car afterwards.  He cannot use crutches at all due to his just so unstable on his feet which is why he fell in the first place.  He says he does not really have any pain and that the problem is not related to the foot surgery          Clinical Data Analysis:   Physical Exam  BP 105/65 (BP Location: Left Arm, Patient Position: Sitting)   Pulse (!) 101   Temp 97.9 F (36.6 C) (Temporal)   Ht 5\' 9"  (1.753 m)  SpO2 95%   BMI 28.03 kg/m  Wt Readings from Last 10 Encounters:  04/06/22 189 lb 12.8 oz (86.1 kg)  03/01/22 189 lb 9.6 oz (86 kg)  01/04/22 184 lb (83.5 kg)  12/22/21 183 lb 12.8 oz (83.4 kg)  12/14/21 182 lb (82.6 kg)  12/10/21 183 lb (83 kg)  11/22/21 182 lb (82.6 kg)  10/21/21 179 lb 12.8 oz (81.6 kg)  07/22/21 187 lb (84.8 kg)  07/19/21 190 lb (86.2 kg)   Vital signs reviewed.  Nursing notes reviewed. Weight trend reviewed. Abnormalities and Problem-Specific physical exam findings:  can't transfer or walk General Appearance:  No acute distress appreciable.   Well-groomed, healthy-appearing male.  Well proportioned with no abnormal fat distribution.  Good muscle tone. Skin: Clear and well-hydrated. Pulmonary:  Normal work of breathing at rest, no respiratory distress apparent. SpO2: 95 %  Musculoskeletal: Patient demonstrates smooth and coordinated movements throughout all major joints.All extremities are intact.  Neurological:  Awake, alert, oriented, and engaged.  No obvious focal neurological deficits or cognitive impairments.  Sensorium seems unclouded. Gait is smooth and coordinated.  Speech is clear and coherent with logical content. Psychiatric:  Appropriate mood, pleasant and cooperative demeanor, cheerful and engaged during the exam    Additional Results Reviewed:     No results found for any visits on 07/08/22.  No results found for this or any previous  visit (from the past 2160 hour(s)).  No image results found.   DG Foot Complete Right  Result Date: 07/08/2022 Please see detailed radiograph report in office note.  CT HEAD WO CONTRAST ( )  Result Date: 07/05/2022 CLINICAL DATA:  Status post fall. EXAM: CT HEAD WITHOUT CONTRAST TECHNIQUE: Contiguous axial images were obtained from the base of the skull through the vertex without intravenous contrast. RADIATION DOSE REDUCTION: This exam was performed according to the departmental dose-optimization program which includes automated exposure control, adjustment of the mA and/or kV according to patient size and/or use of iterative reconstruction technique. COMPARISON:  November 29, 2011 FINDINGS: Brain: There is mild cerebral atrophy with widening of the extra-axial spaces and ventricular dilatation. There are areas of decreased attenuation within the white matter tracts of the supratentorial brain, consistent with microvascular disease changes. Small, chronic bilateral basal ganglia lacunar infarcts are seen. Vascular: No hyperdense vessel or unexpected calcification. Skull: Normal. Negative for fracture or focal lesion. Sinuses/Orbits: No acute finding. Other: None. IMPRESSION: 1. No acute intracranial abnormality. 2. Generalized cerebral atrophy and microvascular disease changes of the supratentorial brain. 3. Small, chronic bilateral basal ganglia lacunar infarcts. Electronically Signed   By: Aram Candela M.D.   On: 07/05/2022 19:26   DG Knee 2 Views Right  Result Date: 07/05/2022 CLINICAL DATA:  Trauma, fall EXAM: RIGHT KNEE - 1-2 VIEW COMPARISON:  None Available. FINDINGS: No recent fracture or dislocation is seen. There is soft tissue fullness in suprapatellar bursa suggesting moderate to large effusion. Fairly extensive arterial calcifications are seen in soft tissues. Small faint calcification is noted anterior to the distal shaft of femur, possibly residual finding from previous soft tissue  injury. IMPRESSION: No recent fracture or dislocation is seen in right knee. There is soft tissue fullness in suprapatellar bursa suggesting moderate to large effusion. Arteriosclerosis. Electronically Signed   By: Ernie Avena M.D.   On: 07/05/2022 17:01   DG Foot Complete Right  Result Date: 06/08/2022 Please see detailed radiograph report in office note.  DG Chest 2 View  Result Date: 05/05/2022 CLINICAL DATA:  Chronic  cough, chest tightness for 5 months EXAM: CHEST - 2 VIEW COMPARISON:  08/15/2018 FINDINGS: Frontal and lateral views of the chest demonstrate loop recorder left anterior chest wall. Cardiac silhouette is unremarkable. No acute airspace disease, effusion, or pneumothorax. Lungs are hyperinflated. No acute bony abnormalities. IMPRESSION: 1. Stable chest, no acute process. Electronically Signed   By: Sharlet Salina M.D.   On: 05/05/2022 15:05   DG Foot 2 Views Right  Result Date: 05/03/2022 Please see detailed radiograph report in office note.  DG Foot 2 Views Left  Result Date: 05/03/2022 Please see detailed radiograph report in office note.    --------------------------------    Signed: Lula Olszewski, MD 07/08/2022 3:26 PM

## 2022-07-08 NOTE — Progress Notes (Signed)
TOC CSW consulted with pt about SNF placement.  Pt was told by Dr. Jon Billings,   "I called [facility] who explained he would need waiver or 3 days stay from emergency room/hospital prior to placement for medicare to cover.   I therefore developed the following plan and printed it for him:  Return to [facility] emergency room Ask about a waiver for rehab placement due to inability to stay safe and care for self at home."  Unfortunately, he can not go to a SNF without a 3 night-stay as a inpatient according to Medicare guidelines.  Pt does not have [facility], therefore a waiver is not available.  CSW explained this to pt.  Pt wants to be admitted and receive SNF, because his wife can not care for him at home.  CSW apologize to pt about the circumstances and reiterated that unfortunately due to Medicare guidelines we can place you at a SNF.  CSW offered [facility] w/PT and DME.  Pt wants to see doctor, so he can be admitted.  CSW stated to the pt she would discuss this with the doctor.  CSW did make doctor aware.  Kacelyn Rowzee Tarpley-Carter, MSW, LCSW-A Pronouns:  She/Her/Hers [facility] HealthTransitions of Care Clinical Social Worker Direct Number:  [phone] [email or web address]

## 2022-07-08 NOTE — Patient Instructions (Signed)
It was a pleasure seeing you today!  Your health and satisfaction are my top priorities. If you believe your experience today was worthy of a 5-star rating, I'd be grateful for your feedback! Lula Olszewski, MD   Next Steps: Return to [facility] emergency room Ask about a waiver for rehab placement due to inability to stay safe and care for self at home.   Identify a rehab facility: Research rehab facilities that accept Medicare patients and have openings. Having a preferred facility in mind can help expedite the process. At the emergency room:   Advise them you would prefer [facility].  Be clear and concise: Explain to the triage nurse and doctor your recent fall, inability to walk or care for yourself at home, and lack of support system. Express your desire for rehab to regain independence.   Focus on safety concerns: Emphasize the safety risks of returning home in your current condition. Mention any concerns about falling again or being unable to meet your basic needs.     - cannot walk   - cannot go to bathroom   - cannot obtain food for himself   - cannot bathe   - cannot wipe rear   - cannot even sit on toilet- due to can't put any weight on R leg   - cannot put any weight on right leg which has ligament injury due to recent fall    - cannot use crutches because even his good leg isn't reliable-the reason fell in first place is legs unstable even prior to right leg injury  Be polite but persistent: Explain you've researched rehab options and understand a short hospital stay might be necessary for a waiver.  Don't downplay your condition: Be honest about your limitations and the challenges of going home. Maintain a calm and cooperative demeanor: This will make you appear more reliable and easier to work with.  Ask questions: If the doctor suggests alternatives, inquire about the reasons and express your preference for rehab if appropriate.  Important to Note: The decision to  admit you to the hospital or grant a waiver ultimately rests with the doctor based on medical necessity. There is no guaranteed way to secure a waiver, but presenting your situation clearly and advocating for your needs can increase your chances.  [email or web address]-[email or web address]: SemiCheap.at This page explains Medicare coverage for skilled nursing facilities (rehab). [employer] on Aging: TLaunch.is This website offers resources on senior care options.  Remember, your health and safety are the priority. Don't hesitate to seek help, and hopefully, this information helps you navigate the process towards a successful rehab placement.\  Unable to care for self  Ataxia  Unable to walk

## 2022-07-08 NOTE — ED Triage Notes (Addendum)
Patient said he has been falling for 2 years. Last fall Tuesday and was seen in the ED. Stated he wants rehab because he keeps falling after Dr. Doran Durand stated to go. Unable to ambulate or bear weight. Stated he cannot stand to wipe his bottom.

## 2022-07-08 NOTE — H&P (Signed)
History and Physical    Patient: Brandon Mendoza ZOX:[id number] DOB: 30-May-1940 DOA: 07/08/2022 DOS: the patient was seen and examined on 07/08/2022 PCP: Lula Olszewski, MD  Patient coming from: Home  Chief Complaint:  Chief Complaint  Patient presents with   Fall   HPI: Brandon Mendoza is a 82 y.o. male with medical history significant  hypertension, hyperlipidemia, COPD, depression who presents to the emergency department due to inability to walk.  Patient states that he fell on Tuesday (4/9) and presented to the emergency department due to right knee pain after the fall and was diagnosed to have ligamentous injury of the right knee, he was placed in a knee immobilizer admission to an acute rehab was discussed, but patient declined at that time and he was discharged home to follow-up with his orthopedic surgeon.  Unfortunately, patient had difficulty in being able to ambulate even with a walker and has sustained about 4 falls since last ED visit.  He states that his leg was always giving up on him.  ED Course:  In the emergency department, pulse was 101 bpm, temperature 97.9, respiratory rate 15/min, O2 sat 95% on room air.  Workup in the ED showed normal CBC except for WBC of 17.6, BMP showed sodium 134, potassium 4.1, chloride 103, bicarb 23, blood glucose 119, BUN/creatinine 42/1.52 (baseline creatinine at 1.1-1.3). Right foot x-ray done on 06/29/2022 showed s/p right great toe arthrodesis with hammertoe arthroplasty and Weil shortening osteotomy right foot. DOS: 06/02/2022  TOC was consulted by ED physician regarding rehab versus home health, however patient does not want to go home due to inability to walk and recurrent falls. He has an elderly wife in the 60s who is incapable of helping or taking care of him.  Hospitalist was asked to admit patient since patient does not qualify for STR/ SNF placement due to his insurance requires 3 midnight stay.    Review of Systems: Review of systems as  noted in the HPI. All other systems reviewed and are negative.   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   The spinal cord appears normal. 2.   No spinal stenosis. 3.   Mild multilevel degenerative changes as detailed above that do not lead to spinal stenosis or nerve root compression. 4.   T2 hyperintense foci within the pons consistent with chronic microvascular ischemic changes.   Loop - Medtronic Linq 10/22/2020 10/22/2020   Nocturia    Peripheral vascular disease    Sleep apnea    on 6 cm, nasal pillow   Stroke 05/01/2011   ischemic   Weakness generalized 10/21/2021   Severe, legs and arms   Past Surgical History:  Procedure Laterality Date   CAROTID ENDARTERECTOMY  12/09/11   Left cea  ENDARTERECTOMY  12/09/2011   Procedure: ENDARTERECTOMY CAROTID;  Surgeon: Larina Earthly, MD;  Location: [facility] OR;  Service: Vascular;  Laterality: Left;   EYE SURGERY     rt retina detachment   HAMMER TOE SURGERY  2004   HERNIA REPAIR  2006   SHOULDER ARTHROSCOPY W/ ROTATOR CUFF REPAIR  2010   TONSILLECTOMY      Social History:  reports that he quit smoking about 17 years ago. His smoking use included cigarettes. He has a 50.00 pack-year smoking history. He has never been exposed to tobacco smoke. He has never used smokeless tobacco. He reports current alcohol use of about 7.0 standard drinks  of alcohol per week. He reports that he does not use drugs.   Allergies  Allergen Reactions   Sulfamethoxazole-Trimethoprim Nausea And Vomiting    Family History  Problem Relation Age of Onset   Alzheimer's disease Father    Diabetes Other      Prior to Admission medications   Medication Sig Start Date End Date Taking? Authorizing Provider  albuterol (VENTOLIN HFA) 108 (90 Base) MCG/ACT inhaler Inhale 1-2 puffs into the lungs every 6 (six) hours as needed for wheezing or shortness of breath. Patient not taking: Reported on 07/08/2022 05/05/22   Wallis Bamberg, PA-C  ALPRAZolam Prudy Feeler) 0.25 MG tablet 1 tablet (0.25 mg total) by Per NG tube route daily as needed for anxiety. Patient not taking: Reported on 07/08/2022 12/22/21   Lula Olszewski, MD  aspirin 81 MG chewable tablet Chew 1 tablet by mouth daily.    [provider]  DULoxetine (CYMBALTA) 30 MG capsule Take 30 mg by mouth daily.    [provider]  flecainide (TAMBOCOR) 100 MG tablet Take 1 tablet (100 mg total) by mouth 2 (two) times daily. 05/19/22   Yates Decamp, MD  ibuprofen (ADVIL) 800 MG tablet Take 1 tablet (800 mg total) by mouth 3 (three) times daily. Patient not taking: Reported on 07/08/2022 06/02/22   Felecia Shelling, DPM  losartan-hydrochlorothiazide [facility] [place]) 50-12.5 MG tablet Take 1 tablet by mouth every morning. 08/17/21   Yates Decamp, MD  meclizine (ANTIVERT) 12.5 MG tablet as needed. As needed. Patient not taking: Reported on 07/08/2022 11/02/21   [provider]  meloxicam (MOBIC) 15 MG tablet Take 15 mg by mouth daily as needed. As needed. Patient not taking: Reported on 07/08/2022 02/10/22   [provider]  MULTIPLE VITAMIN PO Take 1 tablet by mouth daily.    [provider]  oxyCODONE-acetaminophen (PERCOCET) 5-325 MG tablet Take 1 tablet by mouth every 4 (four) hours as needed for severe pain. Patient not taking: Reported on 07/08/2022 06/02/22   Felecia Shelling, DPM  rosuvastatin  (CRESTOR) 20 MG tablet Take 0.5 tablets (10 mg total) by mouth at bedtime. 03/01/22   Lula Olszewski, MD  sertraline (ZOLOFT) 50 MG tablet Take 1 tablet (50 mg total) by mouth daily. Use to taper off sertraline slowly over several months, if tolerated. Be aware anxiety and irritability may increase and you might have to stay on this 03/01/22 02/24/23  Lula Olszewski, MD    Physical Exam: BP 136/65   Pulse 71   Temp (!) 97.4 F (36.3 C) (Oral)   Resp 16   Ht  (1.753 m)   Wt 88 kg   SpO2 96%   BMI 28.65 kg/m   General: 82 y.o. year-old male well developed well nourished in no acute distress.  Alert and oriented x3. HEENT:  NCAT, EOMI, dry mucous membrane Neck: Supple, trachea medial Cardiovascular: Regular rate and rhythm with no rubs or gallops.  No thyromegaly or JVD noted.  No lower extremity edema. 2/4 pulses in all 4 extremities. Respiratory: Clear to auscultation with no wheezes or rales. Good inspiratory effort. Abdomen: Soft, nontender nondistended with normal bowel sounds x4 quadrants. Muskuloskeletal: Right knee immobilizer in place.  Several ecchymoses noted in different parts of body due to prior falls (per patient).   Neuro: CN II-XII intact, sensation, reflexes intact Skin: No ulcerative lesions noted or rashes Psychiatry: Mood is appropriate for condition and setting          Labs on Admission:  Basic Metabolic Panel: Recent Labs  Lab 07/08/22 1706  NA 134*  K 4.1  CL 103  CO2 22  GLUCOSE 119*  BUN 42*  CREATININE 1.52*  CALCIUM 8.7*   Liver Function Tests: Recent Labs  Lab 07/08/22 1706  AST 20  ALT 20  ALKPHOS 74  BILITOT 0.7  PROT 7.0  ALBUMIN 3.9   No results for input(s): "LIPASE", "AMYLASE" in the last 168 hours. No results for input(s): "AMMONIA" in the last 168 hours. CBC: Recent Labs  Lab 07/08/22 1706  WBC 17.6*  NEUTROABS 13.2*  HGB 13.5  HCT 39.3  MCV 90.8  PLT 330   Cardiac Enzymes: Recent Labs  Lab 07/08/22 1706   CKTOTAL 139    BNP (last 3 results) No results for input(s): "BNP" in the last 8760 hours.  ProBNP (last 3 results) No results for input(s): "PROBNP" in the last 8760 hours.  CBG: No results for input(s): "GLUCAP" in the last 168 hours.  Radiological Exams on Admission: DG Foot Complete Right  Result Date: 07/08/2022 Please see detailed radiograph report in office note.   EKG: I independently viewed the EKG done and my findings are as followed: EKG was not done in the ED  Assessment/Plan Present on Admission:  Leukocytosis  Essential hypertension  Mixed hyperlipidemia  Principal Problem:   Recurrent falls Active Problems:   Mixed hyperlipidemia   Essential hypertension   Leukocytosis   Weakness generalized   Dehydration   Depression   COPD (chronic obstructive pulmonary disease)  Recurrent falls Generalized weakness Patient has ambulatory difficulty with recurrent falls due to weakness in lower extremities Patient was generally weak and unable to care for himself Continue fall precaution Continue PT/OT eval and treat Continue Tylenol as needed TOC was already consulted by EDP, please follow-up  s/p right great toe arthrodesis with hammertoe arthroplasty and Weil shortening osteotomy right foot Continue fall precaution Continue PT/OT eval and treat as described above  Dehydration Continue IV hydration  Leukocytosis possibly reactive WBC 17.6, no obvious sign of any acute emergency. Continue to monitor WBC with morning labs  Essential hypertension Continue losartan daily HCTZ will be temporarily held at this time due to elevated creatinine  Mixed hyperlipidemia Continue Crestor  COPD Continue Ventolin as needed  Depression Continue Zoloft  Goals of care: Palliative care will be consulted   DVT prophylaxis: Lovenox  Code Status: Full code  Consults: None  Family Communication: None at bedside  Severity of Illness: The appropriate patient  status for this patient is INPATIENT. Inpatient status is judged to be reasonable and necessary in order to provide the required intensity of service to ensure the patient's safety. The patient's presenting symptoms, physical exam findings, and initial radiographic and laboratory data in the context of their chronic comorbidities is felt to place them at  high risk for further clinical deterioration. Furthermore, it is not anticipated that the patient will be medically stable for discharge from the hospital within 2 midnights of admission.   * I certify that at the point of admission it is my clinical judgment that the patient will require inpatient hospital care spanning beyond 2 midnights from the point of admission due to high intensity of service, high risk for further deterioration and high frequency of surveillance required.*  Author: Frankey Shown, DO 07/08/2022 8:32 PM  For on call review [email or web address].

## 2022-07-08 NOTE — ED Notes (Signed)
ED TO INPATIENT HANDOFF REPORT  ED Nurse Name and Phone #: Crist Infante, RN [phone]  S Name/Age/Gender Brandon Mendoza 82 y.o. male Room/Bed: WHALD/WHALD  Code Status   Code Status: Prior  Home/SNF/Other Nursing Home Patient oriented to: self, place, time, and situation Is this baseline? Yes   Triage Complete: Triage complete  Chief Complaint Recurrent falls [R29.6]  Triage Note Patient said he has been falling for 2 years. Last fall Tuesday and was seen in the ED. Stated he wants rehab because he keeps falling after Dr. Doran Durand stated to go. Unable to ambulate or bear weight. Stated he cannot stand to wipe his bottom.    Allergies Allergies  Allergen Reactions   Sulfamethoxazole-Trimethoprim Nausea And Vomiting    Level of Care/Admitting Diagnosis ED Disposition     ED Disposition  Admit   Condition  --   Comment  Hospital [facility] [[id number]]  Level of Care: Med-Surg [16]  May admit patient to [facility] or [facility] if equivalent level of care is available:: Yes  Covid Evaluation: Asymptomatic - no recent exposure (last 10 days) testing not required  Diagnosis: Recurrent falls [725973]  Admitting Physician: Frankey Shown [[id number]]  Attending Physician: Frankey Shown [[id number]]  Certification:: I certify this patient will need inpatient services for at least 2 midnights  Estimated Length of Stay: 3          B Medical/Surgery History Past Medical History:  Diagnosis Date   Acquired clawfoot, 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   The spinal cord appears normal. 2.   No spinal stenosis. 3.   Mild multilevel degenerative changes as detailed above that do not lead to spinal stenosis or nerve root compression. 4.   T2 hyperintense foci within the pons consistent with chronic microvascular ischemic changes.   Loop - Medtronic Linq 10/22/2020 10/22/2020   Nocturia    Peripheral vascular disease    Sleep apnea    on 6 cm, nasal pillow   Stroke 05/01/2011   ischemic   Weakness generalized 10/21/2021   Severe, legs and arms   Past Surgical History:  Procedure Laterality Date   CAROTID ENDARTERECTOMY  12/09/11   Left cea   ENDARTERECTOMY  12/09/2011   Procedure: ENDARTERECTOMY CAROTID;  Surgeon: Larina Earthly, MD;  Location[facility] OR;  Service: Vascular;  Laterality: Left;   EYE SURGERY     rt retina detachment   HAMMER TOE SURGERY  2004   HERNIA REPAIR  2006   SHOULDER ARTHROSCOPY W/ ROTATOR CUFF REPAIR  2010   TONSILLECTOMY       A IV Location/Drains/Wounds Patient Lines/Drains/Airways Status     Active Line/Drains/Airways     Name Placement date Placement time Site Days   Peripheral IV 07/08/22 20 G Anterior;Left Forearm 07/08/22  1815  Forearm  less than 1            Intake/Output Last 24 hours No intake or output data in the 24 hours ending 07/08/22 1843  Labs/Imaging Results for orders placed or performed during the hospital encounter of 07/08/22 (from the past 48 hour(s))  CBC with Differential     Status: Abnormal   Collection Time: 07/08/22  5:06 PM  Result Value Ref Range   WBC 17.6 (H) 4.0 - 10.5 K/uL   RBC 4.33 4.22 - 5.81 MIL/uL   Hemoglobin 13.5 13.0 - 17.0 g/dL   HCT 92.1 19.4 - 17.4 %   MCV 90.8 80.0 - 100.0 fL   MCH 31.2 26.0 - 34.0 pg   MCHC 34.4 30.0 - 36.0 g/dL   RDW 08.1 44.8 - 18.5 %   Platelets 330 150 - 400 K/uL   nRBC  0.0 0.0 - 0.2 %   Neutrophils Relative % 74 %   Neutro Abs 13.2 (H) 1.7 - 7.7 K/uL   Lymphocytes Relative 11 %   Lymphs Abs 1.9 0.7 - 4.0 K/uL   Monocytes Relative 12 %   Monocytes Absolute 2.0 (H) 0.1 - 1.0 K/uL   Eosinophils Relative 1 %   Eosinophils Absolute 0.2 0.0 - 0.5 K/uL   Basophils Relative 1 %   Basophils Absolute 0.1 0.0 - 0.1 K/uL   Immature Granulocytes 1 %   Abs Immature Granulocytes 0.24 (H) 0.00 - 0.07 K/uL    Comment: Performed at [facility], [address] [place], [place] [address]  Comprehensive metabolic panel     Status: Abnormal   Collection Time: 07/08/22  5:06 PM  Result Value Ref Range   Sodium 134 (L) 135 - 145 mmol/L   Potassium 4.1 3.5 - 5.1 mmol/L   Chloride 103 98 - 111 mmol/L   CO2 22 22 - 32 mmol/L   Glucose, Bld 119 (H) 70 - 99 mg/dL    Comment: Glucose reference range applies only to samples taken after fasting for at least 8 hours.   BUN 42 (H) 8 - 23 mg/dL   Creatinine, Ser 7.02 (H) 0.61 - 1.24 mg/dL   Calcium 8.7 (L) 8.9 - 10.3 mg/dL   Total Protein 7.0 6.5 - 8.1 g/dL   Albumin 3.9 3.5 - 5.0 g/dL   AST 20 15 - 41 U/L   ALT 20 0 - 44 U/L   Alkaline Phosphatase 74 38 - 126 U/L   Total Bilirubin 0.7 0.3 - 1.2 mg/dL   GFR, Estimated 45 (L) >60 mL/min    Comment: (NOTE) Calculated using the CKD-EPI Creatinine Equation (2021)    Anion gap 9 5 - 15    Comment: Performed at [facility], [address] [place], [place] [address]  CK     Status: None   Collection Time: 07/08/22  5:06 PM  Result Value Ref Range   Total CK 139 49 - 397 U/L    Comment: Performed at [facility], [address] [place], [place] [address]   DG Foot Complete Right  Result Date: 07/08/2022 Please see detailed radiograph report in office note.   Pending Labs Unresulted Labs (From admission, onward)     Start     Ordered   07/08/22 1814  Urinalysis, Routine w reflex microscopic -Urine, Clean Catch   Once,   URGENT       Question:  Specimen Source  Answer:  Urine, Clean Catch   07/08/22 1813  Vitals/Pain Today's Vitals   07/08/22 1603 07/08/22 1604 07/08/22 1611 07/08/22 1839  BP: 125/71  (!) 150/84   Pulse: (!) 107  92   Resp: 16  15   Temp: 98 F (36.7 C)  98.9 F (37.2 C)   TempSrc: Oral  Oral   SpO2: 98%  95%   Weight:  88 kg    Height:   (1.753 m)    PainSc: 0-No pain   0-No pain    Isolation Precautions No active isolations  Medications Medications  morphine (PF) 4 MG/ML injection 4 mg (4 mg Intravenous Patient Refused/Not Given 07/08/22 1814)  sodium chloride 0.9 % bolus 1,000 mL (1,000 mLs Intravenous New Bag/Given 07/08/22 1816)  acetaminophen (TYLENOL) tablet 650 mg (650 mg Oral Given 07/08/22 1827)    Mobility walks with device     Focused Assessments Neuro Assessment Handoff:  Swallow screen pass?  N/A         Neuro Assessment: Within Defined Limits Neuro Checks:      Has TPA been given? No If patient is a Neuro Trauma and patient is going to OR before floor call report to 4N Charge nurse: [phone] or [phone]   R Recommendations: See Admitting Provider Note  Report given to:   Additional Notes:

## 2022-07-09 DIAGNOSIS — R296 Repeated falls: Secondary | ICD-10-CM | POA: Diagnosis not present

## 2022-07-09 LAB — CBC
HCT: 36.7 % — ABNORMAL LOW (ref 39.0–52.0)
Hemoglobin: 12.5 g/dL — ABNORMAL LOW (ref 13.0–17.0)
MCH: 31.3 pg (ref 26.0–34.0)
MCHC: 34.1 g/dL (ref 30.0–36.0)
MCV: 92 fL (ref 80.0–100.0)
Platelets: 291 10*3/uL (ref 150–400)
RBC: 3.99 MIL/uL — ABNORMAL LOW (ref 4.22–5.81)
RDW: 13.1 % (ref 11.5–15.5)
WBC: 11.1 10*3/uL — ABNORMAL HIGH (ref 4.0–10.5)
nRBC: 0 % (ref 0.0–0.2)

## 2022-07-09 LAB — COMPREHENSIVE METABOLIC PANEL
ALT: 15 U/L (ref 0–44)
AST: 17 U/L (ref 15–41)
Albumin: 3.4 g/dL — ABNORMAL LOW (ref 3.5–5.0)
Alkaline Phosphatase: 68 U/L (ref 38–126)
Anion gap: 5 (ref 5–15)
BUN: 36 mg/dL — ABNORMAL HIGH (ref 8–23)
CO2: 24 mmol/L (ref 22–32)
Calcium: 8.3 mg/dL — ABNORMAL LOW (ref 8.9–10.3)
Chloride: 105 mmol/L (ref 98–111)
Creatinine, Ser: 1.36 mg/dL — ABNORMAL HIGH (ref 0.61–1.24)
GFR, Estimated: 52 mL/min — ABNORMAL LOW (ref >60)
Glucose, Bld: 140 mg/dL — ABNORMAL HIGH (ref 70–99)
Potassium: 3.6 mmol/L (ref 3.5–5.1)
Sodium: 134 mmol/L — ABNORMAL LOW (ref 135–145)
Total Bilirubin: 0.6 mg/dL (ref 0.3–1.2)
Total Protein: 6.2 g/dL — ABNORMAL LOW (ref 6.5–8.1)

## 2022-07-09 LAB — PHOSPHORUS: Phosphorus: 3.7 mg/dL (ref 2.5–4.6)

## 2022-07-09 LAB — MAGNESIUM: Magnesium: 2.4 mg/dL (ref 1.7–2.4)

## 2022-07-09 MED ORDER — ALBUTEROL SULFATE HFA 108 (90 BASE) MCG/ACT IN AERS: 1.0000 | INHALATION_SPRAY | Freq: Four times a day (QID) | RESPIRATORY_TRACT | Status: DC | PRN

## 2022-07-09 MED ORDER — ROSUVASTATIN CALCIUM 10 MG PO TABS
10.0000 mg | ORAL_TABLET | Freq: Every day | ORAL | Status: DC
  Administered 2022-07-09 – 2022-07-14 (×6): 10 mg via ORAL
  Filled 2022-07-09 (×6): qty 1

## 2022-07-09 MED ORDER — ALBUTEROL SULFATE (2.5 MG/3ML) 0.083% IN NEBU: 2.5000 mg | INHALATION_SOLUTION | Freq: Four times a day (QID) | RESPIRATORY_TRACT | Status: DC | PRN

## 2022-07-09 MED ORDER — FLECAINIDE ACETATE 50 MG PO TABS
100.0000 mg | ORAL_TABLET | Freq: Two times a day (BID) | ORAL | Status: DC
  Administered 2022-07-09 – 2022-07-15 (×12): 100 mg via ORAL
  Filled 2022-07-09 (×13): qty 2

## 2022-07-09 MED ORDER — ASPIRIN 81 MG PO CHEW
81.0000 mg | CHEWABLE_TABLET | Freq: Every day | ORAL | Status: DC
  Administered 2022-07-09 – 2022-07-12 (×4): 81 mg via ORAL
  Filled 2022-07-09 (×5): qty 1

## 2022-07-09 MED ORDER — ENOXAPARIN SODIUM 40 MG/0.4ML IJ SOSY
40.0000 mg | PREFILLED_SYRINGE | INTRAMUSCULAR | Status: DC
  Administered 2022-07-09 – 2022-07-11 (×3): 40 mg via SUBCUTANEOUS
  Filled 2022-07-09 (×3): qty 0.4

## 2022-07-09 MED ORDER — LOSARTAN POTASSIUM 50 MG PO TABS
50.0000 mg | ORAL_TABLET | Freq: Every day | ORAL | Status: DC
  Administered 2022-07-09 – 2022-07-13 (×5): 50 mg via ORAL
  Filled 2022-07-09 (×5): qty 1

## 2022-07-09 MED ORDER — DULOXETINE HCL 30 MG PO CPEP
30.0000 mg | ORAL_CAPSULE | Freq: Every day | ORAL | Status: DC
  Administered 2022-07-09 – 2022-07-14 (×6): 30 mg via ORAL
  Filled 2022-07-09 (×6): qty 1

## 2022-07-09 MED ORDER — HYDROCODONE-ACETAMINOPHEN 5-325 MG PO TABS: 1.0000 | ORAL_TABLET | ORAL | Status: DC | PRN

## 2022-07-09 MED ORDER — SODIUM CHLORIDE 0.9 % IV SOLN: INTRAVENOUS | Status: AC

## 2022-07-09 MED ORDER — SERTRALINE HCL 50 MG PO TABS
50.0000 mg | ORAL_TABLET | Freq: Every day | ORAL | Status: DC
  Administered 2022-07-09 – 2022-07-15 (×7): 50 mg via ORAL
  Filled 2022-07-09 (×7): qty 1

## 2022-07-09 NOTE — Consult Note (Signed)
ORTHOPAEDIC CONSULTATION  REQUESTING PHYSICIAN: Calvert Cantor, MD  PCP:  Lula Olszewski, MD  Chief Complaint: right knee injury  HPI: Brandon Mendoza is a 82 y.o. male who has a history of frequent falls.  He is a known patient of [facility].  The patient was evaluated by Dr. Nilsa Nutting clinic on 07/04/2022, and he received a cortisone injection in the right knee.  The patient states that he experienced excellent pain relief from the injection.  On the very next day, he tripped and fell down 6 stairs.  Since that time he has had right knee pain and inability to weight-bear.  He presented to the emergency department yesterday and was admitted by [facility] for dehydration.  Right knee radiographs were negative for fracture.  Orthopedic consultation was obtained for evaluation of his right knee.  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   The spinal cord appears normal. 2.   No spinal stenosis. 3.   Mild multilevel degenerative changes as detailed above that do not lead to spinal stenosis or nerve root compression. 4.   T2 hyperintense foci within the pons consistent with chronic microvascular ischemic changes.   Loop - Medtronic Linq 10/22/2020 10/22/2020   Nocturia    Peripheral vascular disease    Sleep apnea    on 6 cm, nasal pillow   Stroke 05/01/2011   ischemic   Weakness  generalized 10/21/2021   Severe, legs and arms   Past Surgical History:  Procedure Laterality Date   CAROTID ENDARTERECTOMY  12/09/11   Left cea   ENDARTERECTOMY  12/09/2011   Procedure: ENDARTERECTOMY CAROTID;  Surgeon: Larina Earthly, MD;  Location: [facility] OR;  Service: Vascular;  Laterality: Left;   EYE SURGERY     rt retina detachment   HAMMER TOE SURGERY  2004   HERNIA REPAIR  2006   SHOULDER ARTHROSCOPY W/ ROTATOR CUFF REPAIR  2010   TONSILLECTOMY     Social History   Socioeconomic History   Marital status: Married    Spouse name: Brandon Mendoza   Number of children: 4   Years of education: College   Highest education level: Not on file  Occupational History   Occupation: Retired  Tobacco Use   Smoking status: Former    Packs/day: 1.00    Years: 50.00    Additional pack years: 0.00    Total pack years: 50.00    Types: Cigarettes    Quit date: 08/26/2004    Years since quitting: 17.8    Passive exposure: Never   Smokeless tobacco: Never   Tobacco comments:    quit smoking 08/2004  Vaping Use   Vaping Use: Never used  Substance and Sexual Activity   Alcohol use: Yes    Alcohol/week: 7.0 standard  drinks of alcohol    Types: 7 Standard drinks or equivalent per week    Comment: occ   Drug use: No   Sexual activity: Not Currently  Other Topics Concern   Not on file  Social History Narrative   Patient is married with 4 children.   Patient is right handed.   Patient has college education.   Patient drinks 3 cups daily.   Social Determinants of Health   Financial Resource Strain: Not on file  Food Insecurity: No Food Insecurity (12/10/2021)   Hunger Vital Sign    Worried About Running Out of Food in the Last Year: Never true    Ran Out of Food in the Last Year: Never true  Transportation Needs: No Transportation Needs (12/10/2021)   PRAPARE - Administrator, Civil Service (Medical): No    Lack of Transportation (Non-Medical): No  Physical Activity: Not on file   Stress: Not on file  Social Connections: Not on file   Family History  Problem Relation Age of Onset   Alzheimer's disease Father    Diabetes Other    Allergies  Allergen Reactions   Sulfamethoxazole-Trimethoprim Nausea And Vomiting   Prior to Admission medications   Medication Sig Start Date End Date Taking? Authorizing Provider  albuterol (VENTOLIN HFA) 108 (90 Base) MCG/ACT inhaler Inhale 1-2 puffs into the lungs every 6 (six) hours as needed for wheezing or shortness of breath. Patient not taking: Reported on 07/08/2022 05/05/22   Wallis Bamberg, PA-C  ALPRAZolam Prudy Feeler) 0.25 MG tablet 1 tablet (0.25 mg total) by Per NG tube route daily as needed for anxiety. Patient not taking: Reported on 07/08/2022 12/22/21   Lula Olszewski, MD  aspirin 81 MG chewable tablet Chew 1 tablet by mouth daily.    [provider]  DULoxetine (CYMBALTA) 30 MG capsule Take 30 mg by mouth daily.    [provider]  flecainide (TAMBOCOR) 100 MG tablet Take 1 tablet (100 mg total) by mouth 2 (two) times daily. 05/19/22   Yates Decamp, MD  ibuprofen (ADVIL) 800 MG tablet Take 1 tablet (800 mg total) by mouth 3 (three) times daily. Patient not taking: Reported on 07/08/2022 06/02/22   Felecia Shelling, DPM  losartan-hydrochlorothiazide [facility]) 50-12.5 MG tablet Take 1 tablet by mouth every morning. 08/17/21   Yates Decamp, MD  meclizine (ANTIVERT) 12.5 MG tablet as needed. As needed. Patient not taking: Reported on 07/08/2022 11/02/21   [provider]  meloxicam (MOBIC) 15 MG tablet Take 15 mg by mouth daily as needed. As needed. Patient not taking: Reported on 07/08/2022 02/10/22   [provider]  MULTIPLE VITAMIN PO Take 1 tablet by mouth daily.    [provider]  oxyCODONE-acetaminophen (PERCOCET) 5-325 MG tablet Take 1 tablet by mouth every 4 (four) hours as needed for severe pain. Patient not taking: Reported on 07/08/2022 06/02/22   Felecia Shelling, DPM  rosuvastatin (CRESTOR)  20 MG tablet Take 0.5 tablets (10 mg total) by mouth at bedtime. 03/01/22   Lula Olszewski, MD  sertraline (ZOLOFT) 50 MG tablet Take 1 tablet (50 mg total) by mouth daily. Use to taper off sertraline slowly over several months, if tolerated. Be aware anxiety and irritability may increase and you might have to stay on this 03/01/22 02/24/23  Lula Olszewski, MD   DG Foot Complete Right  Result Date: 07/08/2022 Please see detailed radiograph report in office note.   Positive ROS: All other systems have  been reviewed and were otherwise negative with the exception of those mentioned in the HPI and as above.  Physical Exam: General: Alert, no acute distress Cardiovascular: No pedal edema Respiratory: No cyanosis, no use of accessory musculature GI: No organomegaly, abdomen is soft and non-tender Skin: No lesions in the area of chief complaint Neurologic: Sensation intact distally Psychiatric: Patient is competent for consent with normal mood and affect Lymphatic: No axillary or cervical lymphadenopathy  MUSCULOSKELETAL: Examination of the right knee reveals no skin wounds or lesions.  He has swelling and ecchymosis about the knee.  The skin is intact without imminent compromise.  He has a palpable defect at the superior pole of the patella.  He is unable to perform a straight leg raise.  He has painless logrolling of the hip.  He is neurovascularly intact distally.  Assessment: Right knee injury, clinical concern for extensor mechanism disruption. History of frequent falls.  Plan: I discussed the findings with the patient.  His clinical exam is consistent with quadricep tendon rupture.  At this point, he needs an MRI of the right knee for further evaluation.  I have ordered the MRI.  Continue knee immobilizer for now.  All questions solicited and answered. We will follow.    Jonette Pesa, MD [phone]    07/09/2022 1:11 PM

## 2022-07-09 NOTE — Evaluation (Signed)
Physical Therapy Evaluation Patient Details Name: Brandon Mendoza MRN: [id number] DOB: June 16, 1940 Today's Date: 07/09/2022  History of Present Illness  Patient is a 82 year old male who presented with decreased ability to walk with h/o fall on 4/9. Patient on 4/9 was diagnosed with ligamentous injury of R knee and placed in KI with patient declining therapy and transitioning home. Currently, patient is reporting being unable to take care of himself at this level with four more additional falls since 4/9. PMH: anxiety, kyphosis, ischemic stroke, HTN, COPD. L4-5 PLIF 9/22  Clinical Impression  Pt admitted as above and with functional mobility limitations 2* balance deficits, limited endurance, orthostatic hypotension, and no control of R quads with noted buckling at R knee with any attempts at [facility] despite KI in place.  Pt would benefit from follow up PT in appropriate setting to further address significant deficits.     Recommendations for follow up therapy are one component of a multi-disciplinary discharge planning process, led by the attending physician.  Recommendations may be updated based on patient status, additional functional criteria and insurance authorization.  Follow Up Recommendations       Assistance Recommended at Discharge Frequent or constant Supervision/Assistance  Patient can return home with the following  A lot of help with walking and/or transfers;A little help with bathing/dressing/bathroom;Assistance with cooking/housework;Assist for transportation;Help with stairs or ramp for entrance    Equipment Recommendations None recommended by PT  Recommendations for Other Services  Other (comment) (Pt could benefit rom orthopedic consult based on noted lack of quads control on R.)    Functional Status Assessment Patient has had a recent decline in their functional status and demonstrates the ability to make significant improvements in function in a reasonable and predictable  amount of time.     Precautions / Restrictions Precautions Precautions: Fall Precaution Comments: High risk of falls, R knee buckles even with KI Required Braces or Orthoses: Knee Immobilizer - Right Knee Immobilizer - Right: On at all times Restrictions Weight Bearing Restrictions: Yes Other Position/Activity Restrictions: R knee will buckle with WB even with KI in place      Mobility  Bed Mobility Overal bed mobility: Needs Assistance Bed Mobility: Supine to Sit, Sit to Supine     Supine to sit: Min guard Sit to supine: Min guard   General bed mobility comments: with increased time and cues to slow down    Transfers Overall transfer level: Needs assistance Equipment used: Rolling walker (2 wheels) Transfers: Sit to/from Stand Sit to Stand: Min assist, Mod assist, From elevated surface           General transfer comment: cues for LE management and use of UEs to self assist.  Physical assist to bring wt up and fwd and to balance in standing with RW    Ambulation/Gait Ambulation/Gait assistance: Min assist, Mod assist Gait Distance (Feet): 2 Feet Assistive device: Rolling walker (2 wheels) Gait Pattern/deviations: Step-to pattern, Decreased step length - right, Decreased step length - left, Shuffle, Trunk flexed Gait velocity: decr     General Gait Details: Side shuffle up side of bed only with KI, RW, cues for sequence and with noted buckling at R knee  Stairs            Wheelchair Mobility    Modified Rankin (Stroke Patients Only)       Balance Overall balance assessment: Needs assistance Sitting-balance support: Feet supported Sitting balance-Leahy Scale: Fair     Standing balance support: [employer]  on assistive device for balance, Bilateral upper extremity supported Standing balance-Leahy Scale: Poor                               Pertinent Vitals/Pain Pain Assessment Pain Assessment: Faces Faces Pain Scale: Hurts a little  bit Pain Location: R knee Pain Descriptors / Indicators: Discomfort, Grimacing Pain Intervention(s): Limited activity within patient's tolerance, Monitored during session, Premedicated before session, Ice applied    Home Living Family/patient expects to be discharged to:: Unsure Living Arrangements: Spouse/significant other Available Help at Discharge: Family;Available 24 hours/day Type of Home: House Home Access: Stairs to enter Entrance Stairs-Rails: Right;Left Entrance Stairs-Number of Steps: 4 Alternate Level Stairs-Number of Steps: flight Home Layout: Two level;Able to live on main level with bedroom/bathroom Home Equipment: Rolling Walker (2 wheels);Shower seat;Grab bars - tub/shower;BSC/3in1      Prior Function Prior Level of Function : Independent/Modified Independent             Mobility Comments: Pt IND with mobility until recent fall with knee injury and limited largely to bed to [facility] transfers since then ADLs Comments: patient reported having alot of falls in the last two years.     Hand Dominance   Dominant Hand: Right    Extremity/Trunk Assessment   Upper Extremity Assessment Upper Extremity Assessment: Overall WFL for tasks assessed    Lower Extremity Assessment Lower Extremity Assessment: RLE deficits/detail RLE Deficits / Details: Noted R thigh edema; Pt with min active min knee extension noted and with knee buckling with any attempts to WB with KI in place    Cervical / Trunk Assessment Cervical / Trunk Assessment: Normal  Communication   Communication: No difficulties  Cognition Arousal/Alertness: Awake/alert Behavior During Therapy: WFL for tasks assessed/performed Overall Cognitive Status: Within Functional Limits for tasks assessed                                 General Comments: wife present as well.        General Comments      Exercises     Assessment/Plan    PT Assessment Patient needs continued PT services  PT  Problem List Decreased strength;Decreased range of motion;Decreased activity tolerance;Decreased balance;Decreased mobility;Decreased knowledge of use of DME       PT Treatment Interventions DME instruction;Gait training;Therapeutic activities;Functional mobility training;Stair training;Therapeutic exercise;Patient/family education;Balance training    PT Goals (Current goals can be found in the Care Plan section)  Acute Rehab PT Goals Patient Stated Goal: Regain IND PT Goal Formulation: With patient Time For Goal Achievement: 07/23/22 Potential to Achieve Goals: Fair    Frequency Min 1X/week     Co-evaluation               AM-PAC PT "6 Clicks" Mobility  Outcome Measure Help needed turning from your back to your side while in a flat bed without using bedrails?: A Little Help needed moving from lying on your back to sitting on the side of a flat bed without using bedrails?: A Little Help needed moving to and from a bed to a chair (including a wheelchair)?: A Lot Help needed standing up from a chair using your arms (e.g., wheelchair or bedside chair)?: A Lot Help needed to walk in hospital room?: Total Help needed climbing 3-5 steps with a railing? : Total 6 Click Score: 12    End of Session  Equipment Utilized During Treatment: Gait belt Activity Tolerance: Other (comment) (orthostatic) Patient left: in bed;with call bell/phone within reach;with bed alarm set;with family/visitor present Nurse Communication: Mobility status PT Visit Diagnosis: Unsteadiness on feet (R26.81);Muscle weakness (generalized) (M62.81);History of falling (Z91.81);Difficulty in walking, not elsewhere classified (R26.2)    Time: 1035-1101 PT Time Calculation (min) (ACUTE ONLY): 26 min   Charges:   PT Evaluation $PT Eval Low Complexity: 1 Low          Mauro Kaufmann PT Acute Rehabilitation Services Pager [phone] Office [phone]   Cameron Katayama 07/09/2022, 12:58 PM

## 2022-07-09 NOTE — Evaluation (Signed)
Occupational Therapy Evaluation Patient Details Name: Brandon Mendoza MRN: [id number] DOB: Sep 18, 1940 Today's Date: 07/09/2022   History of Present Illness Patient is a 82 year old male who presented with decreased ability to walk with h/o fall on 4/9. Patient on 4/9 was diagnosed with ligamentous injury of R knee and placed in KI with patient declining therapy and transitioning home. Currently, patient is reporting being unable to take care of himself at this level with four more additional falls since 4/9. PMH: anxiety, kyphosis, ischemic stroke, HTN, COPD. L4-5 PLIF 9/22   Clinical Impression   Patient is a 82 year old male who was admitted for above. Patient was living at home with wife independently with multiple falls over the last two years. Patient currently has KI on RLE with limited ability to stand with orthostatic vitals with increased standing time. Patient has poor insight to education provided on orthostatics with with also present in room. Patient was noted to have decreased functional activity tolerance, decreased endurance, decreased standing balance, decreased safety awareness, and decreased knowledge of AD/AE impacting participation in ADLs. Patient would continue to benefit from skilled OT services at this time while admitted and after d/c to address noted deficits in order to improve overall safety and independence in ADLs.    Blood pressures: Supine: 136/91 mmhg HR 80 bpm Sitting: 146/89 mmhg HR 84 bpm Standing: 128/67 mmhg HR 95 bpm  Standing 3 mins: 98/83 mmhg HR 121 bpm with dizziness and weakness reported. Returned to bed at this time       Recommendations for follow up therapy are one component of a multi-disciplinary discharge planning process, led by the attending physician.  Recommendations may be updated based on patient status, additional functional criteria and insurance authorization.   Assistance Recommended at Discharge Frequent or constant  Supervision/Assistance  Patient can return home with the following A lot of help with walking and/or transfers;A lot of help with bathing/dressing/bathroom;Assistance with cooking/housework;Direct supervision/assist for medications management;Assist for transportation;Help with stairs or ramp for entrance;Direct supervision/assist for financial management    Functional Status Assessment  Patient has had a recent decline in their functional status and demonstrates the ability to make significant improvements in function in a reasonable and predictable amount of time.  Equipment Recommendations  None recommended by OT       Precautions / Restrictions Precautions Precautions: Fall Precaution Comments: High risk of falls, R knee buckles even with KI Required Braces or Orthoses: Knee Immobilizer - Right Knee Immobilizer - Right: On at all times Restrictions Weight Bearing Restrictions: Yes Other Position/Activity Restrictions: R knee will buckle with WB even with KI in place      Mobility Bed Mobility Overal bed mobility: Needs Assistance Bed Mobility: Supine to Sit, Sit to Supine     Supine to sit: Min guard Sit to supine: Min guard   General bed mobility comments: with increased time and cues to slow down           Balance Overall balance assessment: Needs assistance Sitting-balance support: Feet supported Sitting balance-Leahy Scale: Fair     Standing balance support: Reliant on assistive device for balance, Bilateral upper extremity supported Standing balance-Leahy Scale: Poor           ADL either performed or assessed with clinical judgement   ADL Overall ADL's : Needs assistance/impaired Eating/Feeding: Modified independent;Sitting   Grooming: Set up;Sitting   Upper Body Bathing: Set up;Sitting   Lower Body Bathing: Maximal assistance;Bed level Lower Body Bathing Details (  indicate cue type and reason): KI in place with patient unaware of how to adjust KI  appropirately. PT was able to adjust for patient during session. Upper Body Dressing : Set up;Sitting   Lower Body Dressing: Maximal assistance;Sitting/lateral leans Lower Body Dressing Details (indicate cue type and reason): unable to stand without BUE support.   Toilet Transfer Details (indicate cue type and reason): unable to complete today with patient orthostatic with increased time on feet. see vitals section in chart for orthostatics. Toileting- Clothing Manipulation and Hygiene: Maximal assistance;Sit to/from stand                            Pertinent Vitals/Pain Pain Assessment Pain Assessment: Faces Faces Pain Scale: Hurts a little bit Pain Location: R knee Pain Descriptors / Indicators: Discomfort, Grimacing Pain Intervention(s): Limited activity within patient's tolerance, Monitored during session, Repositioned     Hand Dominance Right   Extremity/Trunk Assessment Upper Extremity Assessment Upper Extremity Assessment: Overall WFL for tasks assessed   Lower Extremity Assessment Lower Extremity Assessment: RLE deficits/detail RLE Deficits / Details: Noted R thigh edema; Pt with min active min knee extension noted and with knee buckling with any attempts to WB with KI in place   Cervical / Trunk Assessment Cervical / Trunk Assessment: Normal   Communication Communication Communication: No difficulties   Cognition Arousal/Alertness: Awake/alert Behavior During Therapy: WFL for tasks assessed/performed Overall Cognitive Status: Within Functional Limits for tasks assessed     General Comments: wife present as well.                Home Living Family/patient expects to be discharged to:: Unsure Living Arrangements: Spouse/significant other Available Help at Discharge: Family;Available 24 hours/day Type of Home: House Home Access: Stairs to enter [employer] of Steps: 4 Entrance Stairs-Rails: Right;Left Home Layout: Two level;Able to live  on main level with bedroom/bathroom Alternate Level Stairs-Number of Steps: flight   Bathroom Shower/Tub: Producer, television/film/video: Standard     Home Equipment: Agricultural consultant (2 wheels);Shower seat;Grab bars - tub/shower;BSC/3in1          Prior Functioning/Environment Prior Level of Function : Independent/Modified Independent             Mobility Comments: Pt IND with mobility until recent fall with knee injury and limited largely to bed to [facility] transfers since then ADLs Comments: patient reported having alot of falls in the last two years.        OT Problem List: Decreased activity tolerance;Impaired balance (sitting and/or standing);Decreased coordination;Decreased safety awareness;Decreased knowledge of precautions;Decreased knowledge of use of DME or AE      OT Treatment/Interventions: Self-care/ADL training;Energy conservation;Therapeutic exercise;DME and/or AE instruction;Therapeutic activities;Patient/family education;Balance training    OT Goals(Current goals can be found in the care plan section) Acute Rehab OT Goals Patient Stated Goal: "figure out why i keep falling" OT Goal Formulation: With patient Time For Goal Achievement: 07/23/22 Potential to Achieve Goals: Fair  OT Frequency: Min 2X/week    Co-evaluation PT/OT/SLP Co-Evaluation/Treatment: Yes Reason for Co-Treatment: For patient/therapist safety PT goals addressed during session: Mobility/safety with mobility OT goals addressed during session: ADL's and self-care      AM-PAC OT "6 Clicks" Daily Activity     Outcome Measure Help from another person eating meals?: None Help from another person taking care of personal grooming?: A Little Help from another person toileting, which includes using toliet, bedpan, or urinal?: A Lot Help from  another person bathing (including washing, rinsing, drying)?: A Lot Help from another person to put on and taking off regular upper body clothing?: A  Little Help from another person to put on and taking off regular lower body clothing?: A Lot 6 Click Score: 16   End of Session Equipment Utilized During Treatment: Gait belt;Rolling walker (2 wheels);Right knee immobilizer Nurse Communication: Other (comment) (orthostatics)  Activity Tolerance: Other (comment) (orthostatics) Patient left: in bed;with call bell/phone within reach;with bed alarm set;with family/visitor present  OT Visit Diagnosis: Unsteadiness on feet (R26.81);Other abnormalities of gait and mobility (R26.89);Muscle weakness (generalized) (M62.81)                Time: 1034-1100 OT Time Calculation (min): 26 min Charges:  OT General Charges $OT Visit: 1 Visit OT Evaluation $OT Eval Moderate Complexity: 1 Mod  Danon Lograsso OTR/L, MS Acute Rehabilitation Department Office# [phone]   Selinda Flavin 07/09/2022, 1:21 PM

## 2022-07-09 NOTE — Progress Notes (Signed)
[facility] Progress Note  Patient: Brandon Mendoza     JYN:[id number]  DOA: 07/08/2022   PCP: Lula Olszewski, MD       Brief hospital course: This is an 82 year old male with hypertension, COPD, remote stroke with left carotid endarterectomy, OSA on CPAP, burned-out spinal stenosis and peripheral neuropathy, paroxysmal atrial fibrillation, loop recorder for history of bradycardia and frequent falls (10/22/2020) hyperlipidemia who has had frequent falls over the past few years.    4/3-followed up for right great toe arthrodesis with hammertoe arthroplasty of the second digit of the right foot (06/02/22)-recommended to wear supportive tennis shoes and sneakers and follow-up in 4 to 6 weeks  4/8 - he went to see his orthopedic surgeon and received an injection in his right knee due to chronic pain.  On 4/9 he fell, presented to the ED and was felt to have a ligamentous injury of the right knee and was placed in a knee immobilizer.  The patient insisted on going home but then subsequently was unable to ambulate, fell multiple times again and came back to the ED. In ED: Heart rate in the low 100s, WBC 17.6, BUN 42 creatinine 1.52 with a baseline creatinine of 1.1-1.3.  Of note, he does admit to reducing his flecainide from 100 to 50 mg twice daily because it is very expensive.  He did this about 1 to 2 weeks ago  Subjective:  Continues to complain of pain in right knee and inability to even bear weight on the knee.  Assessment and Plan: Principal Problem:   Recurrent falls-inability to bear weight on right knee -Steroid injection to right knee on 4/8 - Swelling noted right above the right knee with tenderness along with numerous fading bruises - X-ray of the knee from 4/9 suggests a soft tissue fullness in the suprapatellar bursa suggesting moderate to large effusion - Have asked for orthopedic surgery eval-Dr. Linna Caprice feels that he may have a quadriceps tendon tear and an MRI has been  ordered - Continue bedrest and pain control, continue meloxicam - Okay for Lovenox per Ortho  Active Problems: Dehydration and orthostatic hypotension -The patient states he has had an extensive workup for his falls and no one can find out why he is falling - She does wear a loop recorder and takes flecainide for his arrhythmias - The patient does take HCTZ at home but does not feel that he is dehydrated - Orthostatic vitals are significantly positive and as follows 139/91 with heart rate of 80, sitting 148/89 with heart rate of 84, standing 128/67 with heart rate of 95 and standing on 3 minutes 98/83 with heart rate of 121 - He was given IV fluids on 4/12-I have resumed normal saline at 100 cc an hour  AKI, mild hyponatremia - Creatinine 1.34 at baseline - Creatinine 1.58 on 07/08/22 has improved to 1.36 -See above  Leukocytosis - Resolving without antibiotics  History of atrial fibrillation - The patient takes flecainide as ordered by Dr. Jacinto Halim - He is not on anticoagulation-according to prior cardiology note, when he was monitored with a Zio patch he never had more than 2 to 3 hours of atrial fibrillation and does not need anticoagulation-   PVD, carotid endarterectomy and CVA - Continue aspirin and Crestor      Code Status: Full Code Consultants: Orthopedic surgery Level of Care: Level of care: Med-Surg Total time on patient care: 35 minutes DVT prophylaxis: Lovenox  Objective:   Vitals:   07/08/22 2355  07/09/22 0405 07/09/22 0755 07/09/22 1252  BP: (!) 143/71 133/68 125/75 (!) 117/59  Pulse: 74 71 74 72  Resp: 18 18 17    Temp: 98 F (36.7 C) 98.5 F (36.9 C) 97.7 F (36.5 C) 97.8 F (36.6 C)  TempSrc: Oral Oral Oral   SpO2: 96% 94% 96% 95%  Weight:      Height:       Filed Weights   07/08/22 1604  Weight: 88 kg   Exam: General exam: Appears comfortable  HEENT: oral mucosa moist Respiratory system: Clear to auscultation.  Cardiovascular system: S1 & S2  heard  Gastrointestinal system: Abdomen soft, non-tender, nondistended. Normal bowel sounds   Extremities: see below Psychiatry:  Mood & affect appropriate.      CBC: Recent Labs  Lab 07/08/22 1706 07/08/22 2045 07/09/22 0443  WBC 17.6* 15.9* 11.1*  NEUTROABS 13.2*  --   --   HGB 13.5 13.0 12.5*  HCT 39.3 37.8* 36.7*  MCV 90.8 90.0 92.0  PLT 330 309 291   Basic Metabolic Panel: Recent Labs  Lab 07/08/22 1706 07/08/22 2045 07/09/22 0443  NA 134*  --  134*  K 4.1  --  3.6  CL 103  --  105  CO2 22  --  24  GLUCOSE 119*  --  140*  BUN 42*  --  36*  CREATININE 1.52* 1.58* 1.36*  CALCIUM 8.7*  --  8.3*  MG  --   --  2.4  PHOS  --   --  3.7   GFR: Estimated Creatinine Clearance: 45.2 mL/min (A) (by C-G formula based on SCr of 1.36 mg/dL (H)).  Scheduled Meds:  aspirin  81 mg Oral Daily   DULoxetine  30 mg Oral QHS   flecainide  100 mg Oral BID   losartan  50 mg Oral Daily    morphine injection  4 mg Intravenous Once   rosuvastatin  10 mg Oral QHS   sertraline  50 mg Oral Daily   Continuous Infusions:  sodium chloride 100 mL/hr at 07/09/22 3903   Imaging and lab data was personally reviewed DG Foot Complete Right  Result Date: 07/08/2022 Please see detailed radiograph report in office note.   LOS: 1 day   Author: Calvert Cantor  07/09/2022 3:58 PM  To contact [facility]>   Check the care team in [facility] and look for the attending/consulting [facility] provider listed  Log into [email or web address] and use [facility]'s universal password   Go to> "[facility]"  and find provider  If you still have difficulty reaching the provider, please page the [facility] (Director on Call) for the Hospitalists listed on amion

## 2022-07-09 NOTE — TOC Initial Note (Signed)
Transition of Care [facility]) - Initial/Assessment Note    Patient Details  Name: Brandon Mendoza MRN: [id number] Date of Birth: [birth year]  Transition of Care [facility] CM/SW Contact:    Amada Jupiter, LCSW Phone Number: 07/09/2022, 1:47 PM  Clinical Narrative:                  Met with pt today to review anticipated dc needs.  Pt notes that he has been struggling quite a bit at home and unable to ambulate which led to return to the ED and discussion of SNF rehab needs.  He notes that his wife cannot meet his current assistance needs.  Per pt and ortho MD note, it appears there may be suspected injury to knee/ leg and awaiting MRI to confirm.  Pt states that he will likely still need SNF rehab, however, states "if it's something that can be fixed and I can do better then I want to get it fixed first!"  Explained that TOC will follow up once more is known on treatment plan.    Expected Discharge Plan: Skilled Nursing Facility (vs. home with home health) Barriers to Discharge: Continued Medical Work up   Patient Goals and CMS Choice Patient states their goals for this hospitalization and ongoing recovery are:: to determine cause of mobility limitations CMS Medicare.gov Compare Post Acute Care list provided to:: Patient Choice offered to / list presented to : Patient      Expected Discharge Plan and Services In-house Referral: Clinical Social Work     Living arrangements for the past 2 months: Single Family Home                                      Prior Living Arrangements/Services Living arrangements for the past 2 months: Single Family Home Lives with:: Spouse Patient language and need for interpreter reviewed:: Yes Do you feel safe going back to the place where you live?: Yes      Need for Family Participation in Patient Care: Yes (Comment) Care giver support system in place?: Yes (comment)   Criminal Activity/Legal Involvement Pertinent to Current Situation/Hospitalization: No  - Comment as needed  Activities of Daily Living      Permission Sought/Granted Permission sought to share information with : Family Supports Permission granted to share information with : Yes, Verbal Permission Granted  Share Information with NAME: Maneesh Licata, spouse @ [phone]           Emotional Assessment Appearance:: Appears stated age Attitude/Demeanor/Rapport: Engaged Affect (typically observed): Accepting Orientation: : Oriented to Self, Oriented to Place, Oriented to  Time, Oriented to Situation Alcohol / Substance Use: Not Applicable Psych Involvement: No (comment)  Admission diagnosis:  AKI (acute kidney injury) [N17.9] Recurrent falls [R29.6] Patient Active Problem List   Diagnosis Date Noted   Unable to care for self 07/08/2022   Dehydration 07/08/2022   Depression 07/08/2022   COPD (chronic obstructive pulmonary disease) 07/08/2022   Macular degeneration 03/01/2022   Myofascial pain 02/10/2022   Lumbar spondylosis 01/03/2022   Acquired clawfoot, left foot 12/16/2021   Acquired clawfoot, right foot 12/16/2021   Claw foot 12/14/2021   Gait abnormality 12/10/2021   Spinal stenosis at L4-L5 level 12/10/2021   Ataxia 11/22/2021   Lightheadedness 11/02/2021   Claudication of lower extremity 10/21/2021   Atrophy of calf muscles 10/21/2021   Weakness generalized 10/21/2021   Focal neurological deficit 10/21/2021  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   Pain in joint of right hip 09/14/2021   High arches 07/22/2021   Hammertoes of both feet 07/22/2021   Neuropathy 07/22/2021   Constipation 05/28/2021   Disequilibrium 05/28/2021   Personal history of colonic polyps 05/28/2021   Renal cyst 05/28/2021   Gastroesophageal reflux disease without esophagitis 05/05/2021   Ingrowing left great toenail 02/15/2021   S/P lumbar fusion 12/24/2020   Spinal stenosis of lumbar region 12/04/2020   Acquired complex renal cyst 11/23/2020    Functional gait abnormality 11/04/2020   Idiopathic neuropathy 11/02/2020   Tinea pedis of both feet 10/28/2020   Loop - [employer] Linq 10/22/2020 10/22/2020   Benign hypertension with CKD (chronic kidney disease) stage III 10/05/2020   Fall with injury 10/05/2020   Hematoma 09/16/2020   Normocytic anemia 09/14/2020   Leukocytosis 09/14/2020   AF (paroxysmal atrial fibrillation) 09/14/2020   Chronic anticoagulation 09/14/2020   Hyponatremia 09/14/2020   Pain in left foot 01/22/2020   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   Pain in joint of right knee 12/27/2019   Recurrent falls 11/17/2019   Paroxysmal atrial fibrillation 10/22/2019   Pain in right foot 10/14/2019   Falls 10/07/2019   Ataxia involving legs 08/12/2019   OSA on CPAP 06/20/2018   Urge incontinence 04/13/2018   Urinary urgency 04/13/2018   Pain in left knee 03/01/2018   Other intervertebral disc degeneration, lumbar region 06/16/2017   Acquired bilateral hammer toes 10/05/2015   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   Generalized anxiety disorder 05/12/2015   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 [place] - [address] [place] [address] Phone: [phone] Fax: [phone]  [id number] [place], [place] - [address] AT [facility] [address] [facility] [address] [place] [address] Phone: [phone] Fax: [phone]     Social Determinants of  Health (SDOH) Social History: SDOH Screenings   Food Insecurity: No Food Insecurity (12/10/2021)  Housing: Low Risk  (12/10/2021)  Transportation Needs: No Transportation Needs (12/10/2021)  Depression (PHQ2-9): Low Risk  (12/10/2021)  Tobacco Use: Medium Risk (07/08/2022)   SDOH Interventions:     Readmission Risk Interventions    07/09/2022    1:44 PM  Readmission Risk Prevention Plan  Transportation Screening Complete  PCP or Specialist Appt within 5-7 Days Complete  Home Care Screening Complete  Medication Review (RN CM) Complete

## 2022-07-10 ENCOUNTER — Inpatient Hospital Stay (HOSPITAL_COMMUNITY): Payer: Medicare Other

## 2022-07-10 DIAGNOSIS — R296 Repeated falls: Secondary | ICD-10-CM | POA: Diagnosis not present

## 2022-07-10 LAB — CBC
HCT: 35.4 % — ABNORMAL LOW (ref 39.0–52.0)
Hemoglobin: 12 g/dL — ABNORMAL LOW (ref 13.0–17.0)
MCH: 31.5 pg (ref 26.0–34.0)
MCHC: 33.9 g/dL (ref 30.0–36.0)
MCV: 92.9 fL (ref 80.0–100.0)
Platelets: 290 10*3/uL (ref 150–400)
RBC: 3.81 MIL/uL — ABNORMAL LOW (ref 4.22–5.81)
RDW: 13.2 % (ref 11.5–15.5)
WBC: 10.1 10*3/uL (ref 4.0–10.5)
nRBC: 0 % (ref 0.0–0.2)

## 2022-07-10 LAB — BASIC METABOLIC PANEL
Anion gap: 6 (ref 5–15)
BUN: 35 mg/dL — ABNORMAL HIGH (ref 8–23)
CO2: 24 mmol/L (ref 22–32)
Calcium: 8.5 mg/dL — ABNORMAL LOW (ref 8.9–10.3)
Chloride: 108 mmol/L (ref 98–111)
Creatinine, Ser: 1.07 mg/dL (ref 0.61–1.24)
GFR, Estimated: >60 mL/min (ref >60)
Glucose, Bld: 108 mg/dL — ABNORMAL HIGH (ref 70–99)
Potassium: 3.9 mmol/L (ref 3.5–5.1)
Sodium: 138 mmol/L (ref 135–145)

## 2022-07-10 NOTE — Progress Notes (Signed)
[facility] Progress Note  Patient: Brandon Mendoza     JOA:[id number]  DOA: 07/08/2022   PCP: Lula Olszewski, MD       Brief hospital course: This is an 82 year old male with hypertension, COPD, remote stroke with left carotid endarterectomy, OSA on CPAP, burned-out spinal stenosis and peripheral neuropathy, paroxysmal atrial fibrillation, loop recorder for history of bradycardia and frequent falls (10/22/2020) hyperlipidemia who has had frequent falls over the past few years.    4/3-followed up for right great toe arthrodesis with hammertoe arthroplasty of the second digit of the right foot (06/02/22)-recommended to wear supportive tennis shoes and sneakers and follow-up in 4 to 6 weeks  4/8 - he went to see his orthopedic surgeon and received an injection in his right knee due to chronic pain.  On 4/9 he fell, presented to the ED and was felt to have a ligamentous injury of the right knee and was placed in a knee immobilizer.  The patient insisted on going home but then subsequently was unable to ambulate, fell multiple times again and came back to the ED. In ED: Heart rate in the low 100s, WBC 17.6, BUN 42 creatinine 1.52 with a baseline creatinine of 1.1-1.3.  Of note, he does admit to reducing his flecainide from 100 to 50 mg twice daily because it is very expensive.  He did this about 1 to 2 weeks ago  Subjective:  No new complaints.   Assessment and Plan: Principal Problem:   Recurrent falls-inability to bear weight on right knee -Steroid injection to right knee on 4/8 - Swelling noted right above the right knee with tenderness along with numerous fading bruises - X-ray of the knee from 4/9 suggests a soft tissue fullness in the suprapatellar bursa suggesting moderate to large effusion - Have asked for orthopedic surgery eval-Dr. Linna Caprice feels that he may have a quadriceps tendon tear and an MRI has been ordered- still pendind - Continue bedrest and pain control, continue  meloxicam - Okay for Lovenox per Ortho  Active Problems: Dehydration and orthostatic hypotension -The patient states he has had an extensive workup for his falls and no one can find out why he is falling - She does wear a loop recorder and takes flecainide for his arrhythmias - The patient does take HCTZ at home but does not feel that he is dehydrated - Orthostatic vitals are significantly positive and as follows 139/91 with heart rate of 80, sitting 148/89 with heart rate of 84, standing 128/67 with heart rate of 95 and standing on 3 minutes 98/83 with heart rate of 121 - f/u orthostatics and continue IVF as needed  AKI, mild hyponatremia - Creatinine 1.34 at baseline - Creatinine 1.58 on 07/08/22 has improved to 1.36> 1.07 -See above  Leukocytosis - Resolving without antibiotics  History of atrial fibrillation - The patient takes flecainide as ordered by Dr. Jacinto Halim - He is not on anticoagulation-according to prior cardiology note, when he was monitored with a Zio patch he never had more than 2 to 3 hours of atrial fibrillation and does not need anticoagulation-   PVD, carotid endarterectomy and CVA - Continue aspirin and Crestor      Code Status: Full Code Consultants: Orthopedic surgery Level of Care: Level of care: Med-Surg Total time on patient care: 35 minutes DVT prophylaxis: Lovenox  Objective:   Vitals:   07/09/22 1729 07/09/22 2121 07/10/22 0619 07/10/22 1421  BP: (!) 126/59 (!) 124/59 137/65 122/60  Pulse: 80 70 66  60  Resp: Temp: 97.6 F (36.4 C) 98.6 F (37 C) (!) 97.4 F (36.3 C) 98.4 F (36.9 C)  TempSrc: Oral Oral Oral Oral  SpO2: 94% 95% 96% 95%  Weight:      Height:       Filed Weights   07/08/22 1604  Weight: 88 kg   Exam: General exam: Appears comfortable  HEENT: oral mucosa moist Respiratory system: Clear to auscultation.  Cardiovascular system: S1 & S2 heard  Gastrointestinal system: Abdomen soft, non-tender, nondistended.  Normal bowel sounds   Extremities: No cyanosis, clubbing or edema Psychiatry:  Mood & affect appropriate.    CBC: Recent Labs  Lab 07/08/22 1706 07/08/22 2045 07/09/22 0443 07/10/22 0512  WBC 17.6* 15.9* 11.1* 10.1  NEUTROABS 13.2*  --   --   --   HGB 13.5 13.0 12.5* 12.0*  HCT 39.3 37.8* 36.7* 35.4*  MCV 90.8 90.0 92.0 92.9  PLT 330 309 291 290    Basic Metabolic Panel: Recent Labs  Lab 07/08/22 1706 07/08/22 2045 07/09/22 0443 07/10/22 0512  NA 134*  --  134* 138  K 4.1  --  3.6 3.9  CL 103  --  105 108  CO2 22  --  24 24  GLUCOSE 119*  --  140* 108*  BUN 42*  --  36* 35*  CREATININE 1.52* 1.58* 1.36* 1.07  CALCIUM 8.7*  --  8.3* 8.5*  MG  --   --  2.4  --   PHOS  --   --  3.7  --     GFR: Estimated Creatinine Clearance: 57.4 mL/min (by C-G formula based on SCr of 1.07 mg/dL).  Scheduled Meds:  aspirin  81 mg Oral Daily   DULoxetine  30 mg Oral QHS   enoxaparin (LOVENOX) injection  40 mg Subcutaneous Q24H   flecainide  100 mg Oral BID   losartan  50 mg Oral Daily    morphine injection  4 mg Intravenous Once   rosuvastatin  10 mg Oral QHS   sertraline  50 mg Oral Daily   Continuous Infusions:   Imaging and lab data was personally reviewed No results found.  LOS: 2 days   Author: Calvert Cantor  07/10/2022 4:31 PM  To contact [facility]>   Check the care team in [facility] and look for the attending/consulting [facility] provider listed  Log into [email or web address] and use [facility]'s universal password   Go to> "[facility]"  and find provider  If you still have difficulty reaching the provider, please page the [facility] (Director on Call) for the Hospitalists listed on amion

## 2022-07-11 ENCOUNTER — Encounter: Payer: Self-pay | Admitting: Internal Medicine

## 2022-07-11 DIAGNOSIS — R296 Repeated falls: Secondary | ICD-10-CM | POA: Diagnosis not present

## 2022-07-11 LAB — BASIC METABOLIC PANEL
Anion gap: 7 (ref 5–15)
BUN: 36 mg/dL — ABNORMAL HIGH (ref 8–23)
CO2: 24 mmol/L (ref 22–32)
Calcium: 8.5 mg/dL — ABNORMAL LOW (ref 8.9–10.3)
Chloride: 105 mmol/L (ref 98–111)
Creatinine, Ser: 1.1 mg/dL (ref 0.61–1.24)
GFR, Estimated: >60 mL/min (ref >60)
Glucose, Bld: 106 mg/dL — ABNORMAL HIGH (ref 70–99)
Potassium: 4.4 mmol/L (ref 3.5–5.1)
Sodium: 136 mmol/L (ref 135–145)

## 2022-07-11 NOTE — Progress Notes (Signed)
Physical Therapy Treatment Patient Details Name: Brandon Mendoza MRN: [id number] DOB: July 26, 1940 Today's Date: 07/11/2022   History of Present Illness Patient is a 82 year old male who presented with decreased ability to walk with h/o fall on 4/9. Patient on 4/9 was diagnosed with ligamentous injury of R knee and placed in KI with patient declining therapy and transitioning home. ortho consulted, MRI ordered and confirmed Full-thickness tear of the distal quadriceps insertion on the  patella with up to 2.4 cm tendon retraction. Decreased signal likely subacute to older blood products within  the suprapatellar joint fluid, extending through the distal  quadriceps.  Currently, patient is reporting being unable to take care of himself at this level with four more additional falls since 4/9.   PMH: anxiety, kyphosis, ischemic stroke, HTN, COPD. L4-5 PLIF 9/22    PT Comments    Pt quite pleasant and agreeable; placed KI on RLE and reviewed not doing quad sets or attempting SLRs on R. Pt states he cannot function from home at w/c level and given RLE injury would not be safe to d/c home and attempt any ambulation/standing He endorses a strong hx of falls for ~ 2 years.   Pt will likely need post acute rehab, orthopedic intervention pending at this time. Continue PT POC and update as needed per ortho   Recommendations for follow up therapy are one component of a multi-disciplinary discharge planning process, led by the attending physician.  Recommendations may be updated based on patient status, additional functional criteria and insurance authorization.  Follow Up Recommendations  Can patient physically be transported by private vehicle: Yes    Assistance Recommended at Discharge Intermittent Supervision/Assistance  Patient can return home with the following A lot of help with walking and/or transfers;A little help with bathing/dressing/bathroom;Assistance with cooking/housework;Assist for  transportation;Help with stairs or ramp for entrance   Equipment Recommendations  None recommended by PT    Recommendations for Other Services       Precautions / Restrictions Precautions Precautions: Fall Precaution Comments: High risk of falls, R knee buckles even with KI, quad tendon tear confirmed per MRI 07/09/21; advised pt not to attempt lifting RLE, not to attempt quad sets RLE Required Braces or Orthoses: Knee Immobilizer - Right Knee Immobilizer - Right: On at all times Restrictions Other Position/Activity Restrictions: unable to WB RLE     Mobility  Bed Mobility Overal bed mobility: Needs Assistance Bed Mobility: Supine to Sit     Supine to sit: Min guard     General bed mobility comments: with increased time and cues to slow down; min/guard to safely guide RLE to floor    Transfers Overall transfer level: Needs assistance   Transfers: Bed to chair/wheelchair/BSC            Lateral/Scoot Transfers: Min guard General transfer comment: cues for tehcnique, min/guard to laterally scoot to R side be dto chair, min/guard for safety after set up    Ambulation/Gait                   Stairs             Wheelchair Mobility    Modified Rankin (Stroke Patients Only)       Balance   Sitting-balance support: Feet supported Sitting balance-Leahy Scale: Fair  Cognition Arousal/Alertness: Awake/alert Behavior During Therapy: WFL for tasks assessed/performed Overall Cognitive Status: Within Functional Limits for tasks assessed                                 General Comments: pt very pleasant and cooperative        Exercises General Exercises - Lower Extremity Ankle Circles/Pumps: AROM, Both, 10 reps    General Comments General comments (skin integrity, edema, etc.): pt is hopeful to have knee surgery or "get kne efixed " while here; he endorses a strong hx of falls for  the last 2 years, reports knees buckling with incr time standing, no LOC      Pertinent Vitals/Pain Pain Assessment Pain Assessment: Faces Faces Pain Scale: Hurts a little bit Pain Location: R knee with activity Pain Descriptors / Indicators: Discomfort, Grimacing Pain Intervention(s): Monitored during session    Home Living                          Prior Function            PT Goals (current goals can now be found in the care plan section) Acute Rehab PT Goals Patient Stated Goal: Regain IND PT Goal Formulation: With patient Time For Goal Achievement: 07/23/22 Potential to Achieve Goals: Fair Progress towards PT goals: Progressing toward goals    Frequency    Min 1X/week      PT Plan Current plan remains appropriate    Co-evaluation              AM-PAC PT "6 Clicks" Mobility   Outcome Measure  Help needed turning from your back to your side while in a flat bed without using bedrails?: A Little Help needed moving from lying on your back to sitting on the side of a flat bed without using bedrails?: A Little Help needed moving to and from a bed to a chair (including a wheelchair)?: A Little Help needed standing up from a chair using your arms (e.g., wheelchair or bedside chair)?: A Lot Help needed to walk in hospital room?: Total Help needed climbing 3-5 steps with a railing? : Total 6 Click Score: 13    End of Session Equipment Utilized During Treatment: Gait belt Activity Tolerance: Patient tolerated treatment well Patient left: in chair;with call bell/phone within reach;with chair alarm set Nurse Communication: Mobility status PT Visit Diagnosis: Unsteadiness on feet (R26.81);Muscle weakness (generalized) (M62.81);History of falling (Z91.81);Difficulty in walking, not elsewhere classified (R26.2)     Time: 7209-4709 PT Time Calculation (min) (ACUTE ONLY): 18 min  Charges:  $Therapeutic Activity: 8-22 mins                     Delice Bison,  PT  Acute Rehab Dept [facility]) [phone]  07/11/2022    [facility] 07/11/2022, 2:36 PM

## 2022-07-11 NOTE — Progress Notes (Signed)
[facility] Progress Note  Patient: Brandon Mendoza     ZOX:[id number]  DOA: 07/08/2022   PCP: Lula Olszewski, MD       Brief hospital course: This is an 82 year old male with hypertension, COPD, remote stroke with left carotid endarterectomy, OSA on CPAP, burned-out spinal stenosis and peripheral neuropathy, paroxysmal atrial fibrillation, loop recorder for history of bradycardia and frequent falls (10/22/2020) hyperlipidemia who has had frequent falls over the past few years.    4/3-followed up for right great toe arthrodesis with hammertoe arthroplasty of the second digit of the right foot (06/02/22)-recommended to wear supportive tennis shoes and sneakers and follow-up in 4 to 6 weeks  4/8 - he went to see his orthopedic surgeon and received an injection in his right knee due to chronic pain.  On 4/9 he fell, presented to the ED and was felt to have a ligamentous injury of the right knee and was placed in a knee immobilizer.  The patient insisted on going home but then subsequently was unable to ambulate, fell multiple times again and came back to the ED. In ED: Heart rate in the low 100s, WBC 17.6, BUN 42 creatinine 1.52 with a baseline creatinine of 1.1-1.3.  Of note, he does admit to reducing his flecainide from 100 to 50 mg twice daily because it is very expensive.  He did this about 1 to 2 weeks ago  Subjective:  No complaints- waiting on ortho to make a decision about surgery  Assessment and Plan: Principal Problem:   Recurrent falls-inability to bear weight on right knee -Steroid injection to right knee on 4/8 - Swelling noted right above the right knee with tenderness along with numerous fading bruises - X-ray of the knee from 4/9 suggests a soft tissue fullness in the suprapatellar bursa suggesting moderate to large effusion - MRI shows full thickness quad tear with tendon retraction of 2.4 cm and mild to mod semimembranosus-tibial collateral ligament bursitis -  Continue bedrest and pain control  - Okay for Lovenox per Ortho  Active Problems: Dehydration and orthostatic hypotension -The patient states he has had an extensive workup for his falls and no one can find out why he is falling - She does wear a loop recorder and takes flecainide for his arrhythmias - The patient does take HCTZ at home but does not feel that he is dehydrated - Orthostatic vitals are significantly positive and as follows 139/91 with heart rate of 80, sitting 148/89 with heart rate of 84, standing 128/67 with heart rate of 95 and standing on 3 minutes 98/83 with heart rate of 121 - resolved- cont to hold HCTZ  AKI, mild hyponatremia - Creatinine 1.34 at baseline - Creatinine 1.58 on 07/08/22 has improved to 1.36 and then normal -See above  Leukocytosis - Resolved without antibiotics  History of atrial fibrillation - The patient takes flecainide as ordered by Dr. Jacinto Halim - He is not on anticoagulation-according to prior cardiology note, when he was monitored with a Zio patch he never had more than 2 to 3 hours of atrial fibrillation and does not need anticoagulation-   PVD, carotid endarterectomy and CVA - Continue aspirin and Crestor      Code Status: Full Code Consultants: Orthopedic surgery Level of Care: Level of care: Med-Surg Total time on patient care: 35 minutes DVT prophylaxis: Lovenox  Objective:   Vitals:   07/10/22 1421 07/10/22 2108 07/11/22 0620 07/11/22 1305  BP: 122/60 (!) 140/61 130/65 129/74  Pulse: 60 74  71 77  Resp: 16 18 14 17   Temp: 98.4 F (36.9 C) 98.4 F (36.9 C) 97.8 F (36.6 C) 97.9 F (36.6 C)  TempSrc: Oral Oral  Oral  SpO2: 95% 95% 95% 93%  Weight:      Height:       Filed Weights   07/08/22 1604  Weight: 88 kg   Exam: General exam: Appears comfortable  HEENT: oral mucosa moist Respiratory system: Clear to auscultation.  Cardiovascular system: S1 & S2 heard  Gastrointestinal system: Abdomen soft, non-tender,  nondistended. Normal bowel sounds   Extremities: No cyanosis, clubbing or edema Psychiatry:  Mood & affect appropriate.    CBC: Recent Labs  Lab 07/08/22 1706 07/08/22 2045 07/09/22 0443 07/10/22 0512  WBC 17.6* 15.9* 11.1* 10.1  NEUTROABS 13.2*  --   --   --   HGB 13.5 13.0 12.5* 12.0*  HCT 39.3 37.8* 36.7* 35.4*  MCV 90.8 90.0 92.0 92.9  PLT 330 309 291 290    Basic Metabolic Panel: Recent Labs  Lab 07/08/22 1706 07/08/22 2045 07/09/22 0443 07/10/22 0512 07/11/22 0454  NA 134*  --  134* 138 136  K 4.1  --  3.6 3.9 4.4  CL 103  --  105 108 105  CO2 22  --  24 24 24   GLUCOSE 119*  --  140* 108* 106*  BUN 42*  --  36* 35* 36*  CREATININE 1.52* 1.58* 1.36* 1.07 1.10  CALCIUM 8.7*  --  8.3* 8.5* 8.5*  MG  --   --  2.4  --   --   PHOS  --   --  3.7  --   --     GFR: Estimated Creatinine Clearance: 55.8 mL/min (by C-G formula based on SCr of 1.1 mg/dL).  Scheduled Meds:  aspirin  81 mg Oral Daily   DULoxetine  30 mg Oral QHS   enoxaparin (LOVENOX) injection  40 mg Subcutaneous Q24H   flecainide  100 mg Oral BID   losartan  50 mg Oral Daily    morphine injection  4 mg Intravenous Once   rosuvastatin  10 mg Oral QHS   sertraline  50 mg Oral Daily   Continuous Infusions:   Imaging and lab data was personally reviewed MR KNEE RIGHT WO CONTRAST  Result Date: 07/11/2022 CLINICAL DATA:  Knee trauma. Internal derangement suspected. Recurrent falls. Steroid injection to right knee 07/04/2022. Swelling. Tenderness along with numerous fading bruises. EXAM: MRI OF THE RIGHT KNEE WITHOUT CONTRAST TECHNIQUE: Multiplanar, multisequence MR imaging of the knee was performed. No intravenous contrast was administered. COMPARISON:  Right knee radiographs 07/05/2022 FINDINGS: MENISCI Medial meniscus: No discrete tear is seen extending through an articular surface of the medial meniscus. There is minimal degenerative fraying/blunting of the free edge of the posterior horn of the medial  meniscus (sagittal series 12, images 23 and 24). Lateral meniscus:  Intact. LIGAMENTS Cruciates: The ACL and PCL are intact. Collaterals: The medial collateral ligament is intact. The fibular collateral ligament, biceps femoris tendon, iliotibial band, and popliteus tendon are intact. CARTILAGE Patellofemoral: Mild lateral patellar facet partial-thickness cartilage loss. Medial: Moderate weight-bearing medial femoral condyle and medial aspect of the medial tibial plateau cartilage thinning. Lateral: Moderate mid weight-bearing lateral femoral condyle cartilage thinning. Joint: Tinyjoint effusion. There is also mild decreased T1 and decreased T2 signal material within the suprapatellar joint fluid suggesting blood products. This extends through the full-thickness quadriceps tendon tear (sagittal series 13, image 17). Mild edema within the  posterolateral aspect of Hoffa's fat pad at mid height. Popliteal Fossa: Mild to moderate intermediate T2 signal and thickening tendinosis of the semimembranosus tendon just proximal to its intersection with the medial head of gastrocnemius tendon at the semimembranosus/medial collateral ligament bursa (axial series 8 images 14 through 19). No Baker's cyst. Extensor Mechanism: There is a full-thickness tear of the distal quadriceps insertion on the patella with up to approximately 2.4 cm tendon retraction. Mild edema and swelling within the anterior superior knee subcutaneous fat. Bones:  No acute fracture or dislocation. Other: There is mild-to-moderate fluid within the semimembranosus-tibial collateral ligament bursa (sagittal series 13, image 28, axial series 8, image 22, coronal series 11 images 8 through 10). IMPRESSION: 1. Full-thickness tear of the distal quadriceps insertion on the patella with up to 2.4 cm tendon retraction. Decreased T1 and decreased T2 signal likely subacute to older blood products within the suprapatellar joint fluid, extending through the distal  quadriceps full-thickness tendon tear. 2. Mild-to-moderate semimembranosus-tibial collateral ligament bursitis. 3. Mild-to-moderate medial compartment and mild patellofemoral compartment cartilage degenerative changes. 4. Minimal degenerative fraying/blunting of the free edge of the posterior horn of the medial meniscus. No discrete tear is seen extending through an articular surface of the medial meniscus. Electronically Signed   By: Neita Garnet M.D.   On: 07/11/2022 08:38    LOS: 3 days   Author: Calvert Cantor  07/11/2022 3:50 PM  To contact [facility]>   Check the care team in [facility] and look for the attending/consulting [facility] provider listed  Log into [email or web address] and use [place] universal password   Go to> "[facility]"  and find provider  If you still have difficulty reaching the provider, please page the [facility] (Director on Call) for the Hospitalists listed on amion

## 2022-07-12 ENCOUNTER — Encounter (HOSPITAL_COMMUNITY): Payer: Self-pay | Admitting: Internal Medicine

## 2022-07-12 ENCOUNTER — Inpatient Hospital Stay (HOSPITAL_COMMUNITY): Payer: Medicare Other | Admitting: Registered Nurse

## 2022-07-12 ENCOUNTER — Encounter (HOSPITAL_COMMUNITY)
Admission: EM | Disposition: A | Discharge status: Skilled Nursing Facility | Payer: Self-pay | Source: Home / Self Care | Attending: Internal Medicine

## 2022-07-12 DIAGNOSIS — R296 Repeated falls: Secondary | ICD-10-CM | POA: Diagnosis not present

## 2022-07-12 DIAGNOSIS — S76191D Other specified injury of right quadriceps muscle, fascia and tendon, subsequent encounter: Secondary | ICD-10-CM

## 2022-07-12 DIAGNOSIS — S76111A Strain of right quadriceps muscle, fascia and tendon, initial encounter: Secondary | ICD-10-CM

## 2022-07-12 HISTORY — PX: QUADRICEPS TENDON REPAIR: SHX756

## 2022-07-12 LAB — BASIC METABOLIC PANEL
Anion gap: 6 (ref 5–15)
BUN: 31 mg/dL — ABNORMAL HIGH (ref 8–23)
CO2: 25 mmol/L (ref 22–32)
Calcium: 8.6 mg/dL — ABNORMAL LOW (ref 8.9–10.3)
Chloride: 105 mmol/L (ref 98–111)
Creatinine, Ser: 1.09 mg/dL (ref 0.61–1.24)
GFR, Estimated: >60 mL/min (ref >60)
Glucose, Bld: 105 mg/dL — ABNORMAL HIGH (ref 70–99)
Potassium: 4 mmol/L (ref 3.5–5.1)
Sodium: 136 mmol/L (ref 135–145)

## 2022-07-12 LAB — CBC
HCT: 35.1 % — ABNORMAL LOW (ref 39.0–52.0)
Hemoglobin: 11.8 g/dL — ABNORMAL LOW (ref 13.0–17.0)
MCH: 31 pg (ref 26.0–34.0)
MCHC: 33.6 g/dL (ref 30.0–36.0)
MCV: 92.1 fL (ref 80.0–100.0)
Platelets: 265 10*3/uL (ref 150–400)
RBC: 3.81 MIL/uL — ABNORMAL LOW (ref 4.22–5.81)
RDW: 13.2 % (ref 11.5–15.5)
WBC: 11.5 10*3/uL — ABNORMAL HIGH (ref 4.0–10.5)
nRBC: 0 % (ref 0.0–0.2)

## 2022-07-12 LAB — SURGICAL PCR SCREEN
MRSA, PCR: NEGATIVE
Staphylococcus aureus: NEGATIVE

## 2022-07-12 SURGERY — REPAIR, TENDON, QUADRICEPS
Anesthesia: Regional | Site: Leg Upper | Laterality: Right

## 2022-07-12 MED ORDER — DEXAMETHASONE SODIUM PHOSPHATE 10 MG/ML IJ SOLN
INTRAMUSCULAR | Status: DC | PRN
  Administered 2022-07-12: 10 mg

## 2022-07-12 MED ORDER — DIPHENHYDRAMINE HCL 12.5 MG/5ML PO ELIX: 12.5000 mg | ORAL_SOLUTION | ORAL | Status: DC | PRN

## 2022-07-12 MED ORDER — CHLORHEXIDINE GLUCONATE 4 % EX SOLN: 60.0000 mL | Freq: Once | CUTANEOUS | Status: DC

## 2022-07-12 MED ORDER — OXYCODONE HCL 5 MG PO TABS
10.0000 mg | ORAL_TABLET | ORAL | Status: DC | PRN
  Administered 2022-07-14 – 2022-07-15 (×4): 10 mg via ORAL
  Filled 2022-07-12 (×4): qty 2

## 2022-07-12 MED ORDER — ONDANSETRON HCL 4 MG/2ML IJ SOLN
INTRAMUSCULAR | Status: AC
  Filled 2022-07-12: qty 2

## 2022-07-12 MED ORDER — LACTATED RINGERS IV SOLN: INTRAVENOUS | Status: DC | PRN

## 2022-07-12 MED ORDER — ROPIVACAINE HCL 5 MG/ML IJ SOLN
INTRAMUSCULAR | Status: DC | PRN
  Administered 2022-07-12: 20 mL via PERINEURAL

## 2022-07-12 MED ORDER — OXYCODONE HCL 5 MG PO TABS: 5.0000 mg | ORAL_TABLET | ORAL | Status: DC | PRN

## 2022-07-12 MED ORDER — METHOCARBAMOL 500 MG PO TABS
500.0000 mg | ORAL_TABLET | Freq: Four times a day (QID) | ORAL | Status: DC | PRN
  Administered 2022-07-12 – 2022-07-15 (×5): 500 mg via ORAL
  Filled 2022-07-12 (×5): qty 1

## 2022-07-12 MED ORDER — ACETAMINOPHEN 500 MG PO TABS
1000.0000 mg | ORAL_TABLET | Freq: Once | ORAL | Status: AC
  Administered 2022-07-12: 1000 mg via ORAL
  Filled 2022-07-12: qty 2

## 2022-07-12 MED ORDER — PROPOFOL 500 MG/50ML IV EMUL
INTRAVENOUS | Status: DC | PRN
  Administered 2022-07-12: 70 ug/kg/min via INTRAVENOUS

## 2022-07-12 MED ORDER — PHENYLEPHRINE HCL (PRESSORS) 10 MG/ML IV SOLN
INTRAVENOUS | Status: AC
  Filled 2022-07-12: qty 1

## 2022-07-12 MED ORDER — PROPOFOL 10 MG/ML IV BOLUS
INTRAVENOUS | Status: AC
  Filled 2022-07-12: qty 20

## 2022-07-12 MED ORDER — ACETAMINOPHEN 325 MG PO TABS
325.0000 mg | ORAL_TABLET | Freq: Four times a day (QID) | ORAL | Status: DC | PRN
  Administered 2022-07-15: 650 mg via ORAL

## 2022-07-12 MED ORDER — TRANEXAMIC ACID-NACL 1000-0.7 MG/100ML-% IV SOLN
1000.0000 mg | Freq: Once | INTRAVENOUS | Status: AC
  Administered 2022-07-12: 1000 mg via INTRAVENOUS
  Filled 2022-07-12: qty 100

## 2022-07-12 MED ORDER — ONDANSETRON HCL 4 MG/2ML IJ SOLN: 4.0000 mg | Freq: Four times a day (QID) | INTRAMUSCULAR | Status: DC | PRN

## 2022-07-12 MED ORDER — SUCCINYLCHOLINE CHLORIDE 200 MG/10ML IV SOSY
PREFILLED_SYRINGE | INTRAVENOUS | Status: AC
  Filled 2022-07-12: qty 10

## 2022-07-12 MED ORDER — ACETAMINOPHEN 500 MG PO TABS
1000.0000 mg | ORAL_TABLET | Freq: Four times a day (QID) | ORAL | Status: DC
  Administered 2022-07-12 – 2022-07-15 (×6): 1000 mg via ORAL
  Filled 2022-07-12 (×9): qty 2

## 2022-07-12 MED ORDER — LIDOCAINE HCL (PF) 2 % IJ SOLN
INTRAMUSCULAR | Status: AC
  Filled 2022-07-12: qty 5

## 2022-07-12 MED ORDER — POVIDONE-IODINE 10 % EX SWAB: 2.0000 | Freq: Once | CUTANEOUS | Status: DC

## 2022-07-12 MED ORDER — POLYETHYLENE GLYCOL 3350 17 G PO PACK
17.0000 g | PACK | Freq: Two times a day (BID) | ORAL | Status: DC
  Administered 2022-07-12 – 2022-07-15 (×6): 17 g via ORAL
  Filled 2022-07-12 (×6): qty 1

## 2022-07-12 MED ORDER — LIDOCAINE HCL (PF) 2 % IJ SOLN
INTRAMUSCULAR | Status: DC | PRN
  Administered 2022-07-12: 60 mg via INTRADERMAL

## 2022-07-12 MED ORDER — PROPOFOL 10 MG/ML IV BOLUS
INTRAVENOUS | Status: DC | PRN
  Administered 2022-07-12: 330 mg via INTRAVENOUS
  Administered 2022-07-12: 50 mg via INTRAVENOUS

## 2022-07-12 MED ORDER — SODIUM CHLORIDE 0.9 % IV SOLN: INTRAVENOUS | Status: DC

## 2022-07-12 MED ORDER — 0.9 % SODIUM CHLORIDE (POUR BTL) OPTIME
TOPICAL | Status: DC | PRN
  Administered 2022-07-12: 1000 mL

## 2022-07-12 MED ORDER — HYDROMORPHONE HCL 1 MG/ML IJ SOLN
0.5000 mg | INTRAMUSCULAR | Status: DC | PRN
  Administered 2022-07-14 (×3): 1 mg via INTRAVENOUS
  Filled 2022-07-12 (×5): qty 1

## 2022-07-12 MED ORDER — MENTHOL 3 MG MT LOZG: 1.0000 | LOZENGE | OROMUCOSAL | Status: DC | PRN

## 2022-07-12 MED ORDER — GLYCOPYRROLATE 0.2 MG/ML IJ SOLN
INTRAMUSCULAR | Status: AC
  Filled 2022-07-12: qty 1

## 2022-07-12 MED ORDER — FENTANYL CITRATE PF 50 MCG/ML IJ SOSY: 100.0000 ug | PREFILLED_SYRINGE | INTRAMUSCULAR | Status: DC

## 2022-07-12 MED ORDER — DEXAMETHASONE SODIUM PHOSPHATE 10 MG/ML IJ SOLN
10.0000 mg | Freq: Once | INTRAMUSCULAR | Status: AC
  Administered 2022-07-13: 10 mg via INTRAVENOUS
  Filled 2022-07-12: qty 1

## 2022-07-12 MED ORDER — CEFAZOLIN SODIUM-DEXTROSE 2-4 GM/100ML-% IV SOLN
2.0000 g | Freq: Four times a day (QID) | INTRAVENOUS | Status: AC
  Administered 2022-07-12 – 2022-07-13 (×2): 2 g via INTRAVENOUS
  Filled 2022-07-12 (×2): qty 100

## 2022-07-12 MED ORDER — FENTANYL CITRATE PF 50 MCG/ML IJ SOSY
PREFILLED_SYRINGE | INTRAMUSCULAR | Status: AC
  Administered 2022-07-12: 100 ug
  Filled 2022-07-12: qty 2

## 2022-07-12 MED ORDER — DOCUSATE SODIUM 100 MG PO CAPS
100.0000 mg | ORAL_CAPSULE | Freq: Two times a day (BID) | ORAL | Status: DC
  Administered 2022-07-12 – 2022-07-15 (×5): 100 mg via ORAL
  Filled 2022-07-12 (×6): qty 1

## 2022-07-12 MED ORDER — PHENYLEPHRINE HCL (PRESSORS) 10 MG/ML IV SOLN
INTRAVENOUS | Status: DC | PRN
  Administered 2022-07-12: 80 ug via INTRAVENOUS

## 2022-07-12 MED ORDER — MIDAZOLAM HCL 2 MG/2ML IJ SOLN: 2.0000 mg | INTRAMUSCULAR | Status: DC

## 2022-07-12 MED ORDER — ASPIRIN 81 MG PO CHEW
81.0000 mg | CHEWABLE_TABLET | Freq: Two times a day (BID) | ORAL | Status: DC
  Administered 2022-07-12 – 2022-07-15 (×4): 81 mg via ORAL
  Filled 2022-07-12 (×6): qty 1

## 2022-07-12 MED ORDER — PHENOL 1.4 % MT LIQD: 1.0000 | OROMUCOSAL | Status: DC | PRN

## 2022-07-12 MED ORDER — METOCLOPRAMIDE HCL 5 MG PO TABS: 5.0000 mg | ORAL_TABLET | Freq: Three times a day (TID) | ORAL | Status: DC | PRN

## 2022-07-12 MED ORDER — STERILE WATER FOR IRRIGATION IR SOLN
Status: DC | PRN
  Administered 2022-07-12: 1000 mL

## 2022-07-12 MED ORDER — CEFAZOLIN SODIUM-DEXTROSE 2-4 GM/100ML-% IV SOLN
2.0000 g | INTRAVENOUS | Status: DC
  Filled 2022-07-12: qty 100

## 2022-07-12 MED ORDER — ONDANSETRON HCL 4 MG PO TABS: 4.0000 mg | ORAL_TABLET | Freq: Four times a day (QID) | ORAL | Status: DC | PRN

## 2022-07-12 MED ORDER — FENTANYL CITRATE PF 50 MCG/ML IJ SOSY
100.0000 ug | PREFILLED_SYRINGE | Freq: Once | INTRAMUSCULAR | Status: AC
  Administered 2022-07-12: 50 ug via INTRAVENOUS
  Administered 2022-07-12 (×2): 25 ug via INTRAVENOUS

## 2022-07-12 MED ORDER — KETOROLAC TROMETHAMINE 30 MG/ML IJ SOLN
INTRAMUSCULAR | Status: AC
  Filled 2022-07-12: qty 1

## 2022-07-12 MED ORDER — TRANEXAMIC ACID-NACL 1000-0.7 MG/100ML-% IV SOLN
1000.0000 mg | INTRAVENOUS | Status: AC
  Administered 2022-07-12: 1000 mg via INTRAVENOUS
  Filled 2022-07-12: qty 100

## 2022-07-12 MED ORDER — MEPIVACAINE HCL (PF) 2 % IJ SOLN
INTRAMUSCULAR | Status: AC
  Filled 2022-07-12: qty 20

## 2022-07-12 MED ORDER — FENTANYL CITRATE PF 50 MCG/ML IJ SOSY: 25.0000 ug | PREFILLED_SYRINGE | INTRAMUSCULAR | Status: DC | PRN

## 2022-07-12 MED ORDER — PHENYLEPHRINE HCL-NACL 20-0.9 MG/250ML-% IV SOLN
INTRAVENOUS | Status: DC | PRN
  Administered 2022-07-12: 40 ug/min via INTRAVENOUS

## 2022-07-12 MED ORDER — BUPIVACAINE-EPINEPHRINE (PF) 0.25% -1:200000 IJ SOLN
INTRAMUSCULAR | Status: AC
  Filled 2022-07-12: qty 30

## 2022-07-12 MED ORDER — METOCLOPRAMIDE HCL 5 MG/ML IJ SOLN: 5.0000 mg | Freq: Three times a day (TID) | INTRAMUSCULAR | Status: DC | PRN

## 2022-07-12 MED ORDER — BISACODYL 10 MG RE SUPP: 10.0000 mg | Freq: Every day | RECTAL | Status: DC | PRN

## 2022-07-12 MED ORDER — ROCURONIUM BROMIDE 10 MG/ML (PF) SYRINGE
PREFILLED_SYRINGE | INTRAVENOUS | Status: AC
  Filled 2022-07-12: qty 10

## 2022-07-12 MED ORDER — ONDANSETRON HCL 4 MG/2ML IJ SOLN
INTRAMUSCULAR | Status: DC | PRN
  Administered 2022-07-12: 4 mg via INTRAVENOUS

## 2022-07-12 MED ORDER — GLYCOPYRROLATE 0.2 MG/ML IJ SOLN
INTRAMUSCULAR | Status: DC | PRN
  Administered 2022-07-12: .2 mg via INTRAVENOUS

## 2022-07-12 MED ORDER — FENTANYL CITRATE (PF) 100 MCG/2ML IJ SOLN
INTRAMUSCULAR | Status: AC
  Filled 2022-07-12: qty 2

## 2022-07-12 MED ORDER — METHOCARBAMOL 1000 MG/10ML IJ SOLN: 500.0000 mg | Freq: Four times a day (QID) | INTRAVENOUS | Status: DC | PRN

## 2022-07-12 MED ORDER — DEXAMETHASONE SODIUM PHOSPHATE 10 MG/ML IJ SOLN
INTRAMUSCULAR | Status: AC
  Filled 2022-07-12: qty 1

## 2022-07-12 MED ORDER — MEPIVACAINE HCL (PF) 2 % IJ SOLN
INTRAMUSCULAR | Status: DC | PRN
  Administered 2022-07-12: 50 mg via INTRATHECAL

## 2022-07-12 SURGICAL SUPPLY — 44 items
ADH SKN CLS APL DERMABOND .7 (GAUZE/BANDAGES/DRESSINGS) ×1
BAG COUNTER SPONGE SURGICOUNT (BAG) IMPLANT
BAG SPEC THK2 15X12 ZIP CLS (MISCELLANEOUS) ×1
BAG SPNG CNTER NS LX DISP (BAG)
BAG ZIPLOCK 12X15 (MISCELLANEOUS) ×1 IMPLANT
BIT DRILL 2.8X128 (BIT) ×1 IMPLANT
BLADE SAW SGTL 11.0X1.19X90.0M (BLADE) IMPLANT
BNDG CMPR 6"X 5 YARDS HK CLSR (GAUZE/BANDAGES/DRESSINGS) ×1
BNDG CMPR MED 10X6 ELC LF (GAUZE/BANDAGES/DRESSINGS) ×1
BNDG ELASTIC 6INX 5YD STR LF (GAUZE/BANDAGES/DRESSINGS) ×1 IMPLANT
BNDG ELASTIC 6X10 VLCR STRL LF (GAUZE/BANDAGES/DRESSINGS) IMPLANT
COVER SURGICAL LIGHT HANDLE (MISCELLANEOUS) ×1 IMPLANT
CUFF TOURN SGL QUICK 34 (TOURNIQUET CUFF) ×1
CUFF TRNQT CYL 34X4.125X (TOURNIQUET CUFF) IMPLANT
DERMABOND ADVANCED .7 DNX12 (GAUZE/BANDAGES/DRESSINGS) ×1 IMPLANT
DRAPE INCISE IOBAN 66X45 STRL (DRAPES) ×3 IMPLANT
DRSG AQUACEL AG ADV 3.5X10 (GAUZE/BANDAGES/DRESSINGS) ×1 IMPLANT
DURAPREP 26ML APPLICATOR (WOUND CARE) ×1 IMPLANT
ELECT REM PT RETURN 15FT ADLT (MISCELLANEOUS) ×1 IMPLANT
GLOVE BIOGEL PI IND STRL 7.5 (GLOVE) ×3 IMPLANT
GLOVE BIOGEL PI IND STRL 8.5 (GLOVE) ×1 IMPLANT
GLOVE ECLIPSE 8.0 STRL XLNG CF (GLOVE) ×1 IMPLANT
GLOVE INDICATOR 6.5 STRL GRN (GLOVE) ×1 IMPLANT
GOWN STRL REUS W/ TWL LRG LVL3 (GOWN DISPOSABLE) ×2 IMPLANT
GOWN STRL REUS W/TWL LRG LVL3 (GOWN DISPOSABLE) ×2
IMMOBILIZER KNEE 20 (SOFTGOODS) IMPLANT
IMMOBILIZER KNEE 20 THIGH 36 (SOFTGOODS) ×1 IMPLANT
KIT TURNOVER KIT A (KITS) IMPLANT
MANIFOLD NEPTUNE II (INSTRUMENTS) ×1 IMPLANT
NS IRRIG 1000ML POUR BTL (IV SOLUTION) ×1 IMPLANT
PACK TOTAL KNEE CUSTOM (KITS) ×1 IMPLANT
PADDING CAST COTTON 6X4 STRL (CAST SUPPLIES) IMPLANT
PASSER SUT SWANSON 36MM LOOP (INSTRUMENTS) ×1 IMPLANT
PROTECTOR NERVE ULNAR (MISCELLANEOUS) ×1 IMPLANT
SUT FIBERWIRE #2 38 T-5 BLUE (SUTURE) ×1
SUT MNCRL AB 4-0 PS2 18 (SUTURE) ×1 IMPLANT
SUT VIC AB 0 CT1 27 (SUTURE) ×1
SUT VIC AB 0 CT1 27XBRD ANTBC (SUTURE) ×1 IMPLANT
SUT VIC AB 1 CT1 36 (SUTURE) ×2 IMPLANT
SUT VIC AB 2-0 CT1 27 (SUTURE) ×2
SUT VIC AB 2-0 CT1 TAPERPNT 27 (SUTURE) ×2 IMPLANT
SUTURE FIBERWR #2 38 T-5 BLUE (SUTURE) ×1 IMPLANT
TOWEL OR 17X26 10 PK STRL BLUE (TOWEL DISPOSABLE) IMPLANT
WATER STERILE IRR 1000ML POUR (IV SOLUTION) ×1 IMPLANT

## 2022-07-12 NOTE — Anesthesia Postprocedure Evaluation (Signed)
Anesthesia Post Note  Patient: Brandon Mendoza  Procedure(s) Performed: REPAIR QUADRICEP TENDON (Right: Leg Upper)     Patient location during evaluation: PACU Anesthesia Type: Regional and Spinal Level of consciousness: oriented and awake and alert Pain management: pain level controlled Vital Signs Assessment: post-procedure vital signs reviewed and stable Respiratory status: spontaneous breathing, respiratory function stable and patient connected to nasal cannula oxygen Cardiovascular status: blood pressure returned to baseline and stable Postop Assessment: no headache, no backache and no apparent nausea or vomiting Anesthetic complications: no  No notable events documented.  Last Vitals:  Vitals:   07/12/22 1830 07/12/22 1845  BP: (!) 122/49 130/64  Pulse: 66 71  Resp: 13 18  Temp: 36.7 C   SpO2: 97% 97%    Last Pain:  Vitals:   07/12/22 1845  TempSrc:   PainSc: 0-No pain                 Fishel Wamble L Ellayna Hilligoss

## 2022-07-12 NOTE — Anesthesia Procedure Notes (Signed)
Spinal  Patient location during procedure: OR Start time: 07/12/2022 3:35 PM End time: 07/12/2022 3:38 PM Reason for block: surgical anesthesia Staffing Performed: anesthesiologist  Anesthesiologist: Elmer Picker, MD Performed by: Elmer Picker, MD Authorized by: Elmer Picker, MD   Preanesthetic Checklist Completed: patient identified, IV checked, risks and benefits discussed, surgical consent, monitors and equipment checked, pre-op evaluation and timeout performed Spinal Block Patient position: sitting Prep: DuraPrep and site prepped and draped Patient monitoring: cardiac monitor, continuous pulse ox and blood pressure Approach: midline Location: L3-4 Injection technique: single-shot Needle Needle type: Pencan  Needle gauge: 24 G Needle length: 9 cm Assessment Sensory level: T6 Events: CSF return Additional Notes Functioning IV was confirmed and monitors were applied. Sterile prep and drape, including hand hygiene and sterile gloves were used. The patient was positioned and the spine was prepped. The skin was anesthetized with lidocaine.  Free flow of clear CSF was obtained prior to injecting local anesthetic into the CSF.  The spinal needle aspirated freely following injection.  The needle was carefully withdrawn.  The patient tolerated the procedure well.

## 2022-07-12 NOTE — Transfer of Care (Signed)
Immediate Anesthesia Transfer of Care Note  Patient: Brandon Mendoza  Procedure(s) Performed: REPAIR QUADRICEP TENDON (Right: Leg Upper)  Patient Location: PACU  Anesthesia Type:Spinal  Level of Consciousness: awake  Airway & Oxygen Therapy: Patient Spontanous Breathing and Patient connected to face mask oxygen  Post-op Assessment: Report given to RN and Post -op Vital signs reviewed and stable  Post vital signs: Reviewed and stable  Last Vitals:  Vitals Value Taken Time  BP 110/53 07/12/22 1712  Temp    Pulse 69 07/12/22 1714  Resp 9 07/12/22 1714  SpO2 100 % 07/12/22 1714  Vitals shown include unvalidated device data.  Last Pain:  Vitals:   07/12/22 1510  TempSrc:   PainSc: Asleep         Complications: No notable events documented.

## 2022-07-12 NOTE — Brief Op Note (Signed)
07/08/2022 - 07/12/2022  4:58 PM  PATIENT:  Brandon Mendoza  82 y.o. male  PRE-OPERATIVE DIAGNOSIS:  Ruptured quadricept tendon  POST-OPERATIVE DIAGNOSIS:  Ruptured quadricept tendon  PROCEDURE:  Procedure(s): REPAIR QUADRICEP TENDON (Right)  SURGEON:  Surgeon(s) and Role:    Durene Romans, MD - Primary  PHYSICIAN ASSISTANT: Rosalene Billings, PA-C  ANESTHESIA:   regional and spinal  EBL:  75 mL   BLOOD ADMINISTERED:none  DRAINS: none   LOCAL MEDICATIONS USED:  NONE  SPECIMEN:  No Specimen  DISPOSITION OF SPECIMEN:  N/A  COUNTS:  YES  TOURNIQUET:  29 min at 225 mmHg  DICTATION: .Other Dictation: Dictation Number [id number]  PLAN OF CARE: Admit to inpatient   PATIENT DISPOSITION:  PACU - hemodynamically stable.   Delay start of Pharmacological VTE agent (>24hrs) due to surgical blood loss or risk of bleeding: no

## 2022-07-12 NOTE — Op Note (Unsigned)
NAME: Brandon Mendoza, M[place] MEDICAL RECORD NO: [id number] ACCOUNT NO: [id number] DATE OF BIRTH: [birth year] FACILITY: [facility] LOCATION: [id number] PHYSICIAN: Madlyn Frankel. Charlann Boxer, MD  Operative Report   DATE OF PROCEDURE: 07/12/2022  PREOPERATIVE DIAGNOSIS:  Ruptured quadriceps tendon.  POSTOPERATIVE DIAGNOSIS:  Ruptured quadriceps tendon with extensive damage to the vastus medialis musculature related to the retinacular tearing medially as well as significant tearing laterally.  PROCEDURE:  Open repair of right quadriceps tendon including the tendon repair to bone through bone tunnels as well as repair of the retinacular tissue.  I used a combination of two, #2 FiberWire sutures again through the drill tunnels.  SURGEON:  Madlyn Frankel. Charlann Boxer, MD  ASSISTANT:  Rosalene Billings, PA-C.  Note that Ms. Domenic Schwab was present for the entirety of the case from preoperative positioning, perioperative management of the operative extremity, general facilitation of the case including passing the sutures through  the bone tunnels as well as reduction of the quadriceps tendon to the patella.  ANESTHESIA:  Regional plus spinal.  BLOOD LOSS:  About 75-100 mL.  TOURNIQUET:  Up for 29 minutes at 225 mmHg.  INDICATIONS FOR THE PROCEDURE:  The patient is an 82 year old gentleman who had been seen in our office on 07/04/2022.  We performed a cortisone injection in his right knee.  He was in his normal state of health feeling well until he tripped on some steps  and fell down 6 stairs.  He had immediate onset of right knee pain with inability to function.  He was brought to the hospital where he was admitted to the hospitalist service.  Orthopedics was consulted based on his knee pain.  On clinical exam as well  as radiographic studies including MRI, he was found to have a quadriceps tendon rupture.  The necessity to repair the tendon was reviewed with him.  The risks of failure of fixation, and need for future surgeries were reviewed in  addition to risks of  infection or DVT.  DESCRIPTION OF PROCEDURE:  The patient was brought to the operative theater.  Once adequate anesthesia was established, preoperative antibiotics, Ancef administered as well as tranexamic acid.  He was positioned supine with a right thigh tourniquet  placed.  The right lower extremity was then prepped and draped in sterile fashion.  A timeout was performed identifying the patient, planned procedure, and extremity.  The leg was exsanguinated and tourniquet was elevated to 225 mmHg.  A midline incision  was made.  As I got through the subcutaneous fat layer to the retinacular layer of the knee including the peritendinous region, we found that there was significant disruption of the retinacular structures medially and laterally with significant  disruption of the vastus medialis on the medial side of the knee.  Soft tissue exposure was carried out including dissecting out layers for closure purposes.  As we entered into the joint area, there was significant hematoma, which was all evacuated.   This extended up into the thigh as well as into the knee itself.  We irrigated the knee out with normal saline solution.  Once I had done this debridement and evaluation of the soft tissues we prepared the tendon with two, #2 FiberWire sutures passed in  a [place] weave pattern into the distal stump of the quadriceps tendon.  The 4 strands were out the distal end of this.  We then passed three drill holes, creating three drill holes into the patella in parallel.  The central two stitches were passed  through the  central hole and the medial and lateral sutures through the medial and lateral drill holes respectively.  Prior to preparation the drill holes the proximal end of the patella was roughened down to cancellous bone with a rongeur.  With the  sutures not passing the bone, I used a combination of a towel clip that I actually pulled proximally while another assistant pulled 2 of  the sutures distally.  This reapproximated the tendon to the bone.  I reapproximated the lateral 2 sutures first  followed by the medial two sutures, which were then reapproximated to one another.  Once this was done and the tendon was down to bone for the most part I did an attempt at repairing as much of the medial retinacular soft tissues as I could.  We then  reapproximated the lateral retinacular tissues done with a #1 Vicryl.  I then reapproximated what was probably a combination of peritendinous soft tissues as well as bursal type tissue.  This provided a nice layer over top of the repair.  The patella  mobility at this point appeared to be very stable with this construct.  Though it was not possible for me to say that this was a watertight seal based on the damage to the retinacular structures medially and laterally.  The tourniquet had been let down  prior to the reduction of the tendon to the patella.  There was no significant hemostasis required.  At this point, the wound was closed in layers using 2-0 Vicryl and a running Monocryl stitch.  The wound was clean, dry and dressed sterilely using  surgical glue and Aquacel dressing.  The knee was wrapped into a bulky dressing and then placed back in a knee immobilizer.  Postoperatively, he will be placed into a Bledsoe brace locked in extension.  He can be weightbearing as tolerated.  We will be following up in the office to make certain that his incision heals and that he has been compliant with his range of motion as  we discussed the complications of failure of fixation, and need for future surgeries.   PUS D: 07/12/2022 5:07:28 pm T: 07/12/2022 7:02:00 pm  JOB: [id number]/ [id number]

## 2022-07-12 NOTE — Discharge Instructions (Signed)

## 2022-07-12 NOTE — Interval H&P Note (Signed)
History and Physical Interval Note:  07/12/2022 12:53 PM  Brandon Mendoza  has presented today for surgery, with the diagnosis of Ruptured quad tendon.  The various methods of treatment have been discussed with the patient and family. After consideration of risks, benefits and other options for treatment, the patient has consented to  Procedure(s): REPAIR QUADRICEP TENDON (Right) as a surgical intervention.  The patient's history has been reviewed, patient examined, no change in status, stable for surgery.  I have reviewed the patient's chart and labs.  Questions were answered to the patient's satisfaction.     Shelda Pal

## 2022-07-12 NOTE — Progress Notes (Signed)
Orthopedic Tech Progress Note Patient Details:  Brandon Mendoza November 22, 1940 [id number]  [facility] [id number]}  [name] 07/12/2022, 7:15 PM

## 2022-07-12 NOTE — Progress Notes (Signed)
[facility] Progress Note  Patient: Brandon Mendoza     UJW:[id number]  DOA: 07/08/2022   PCP: Lula Olszewski, MD       Brief hospital course: This is an 82 year old male with hypertension, COPD, remote stroke with left carotid endarterectomy, OSA on CPAP, burned-out spinal stenosis and peripheral neuropathy, paroxysmal atrial fibrillation, loop recorder for history of bradycardia and frequent falls (10/22/2020) hyperlipidemia who has had frequent falls over the past few years.    4/3-followed up for right great toe arthrodesis with hammertoe arthroplasty of the second digit of the right foot (06/02/22)-recommended to wear supportive tennis shoes and sneakers and follow-up in 4 to 6 weeks  4/8 - he went to see his orthopedic surgeon and received an injection in his right knee due to chronic pain.  On 4/9 he fell, presented to the ED and was felt to have a ligamentous injury of the right knee and was placed in a knee immobilizer.  The patient insisted on going home but then subsequently was unable to ambulate, fell multiple times again and came back to the ED. In ED: Heart rate in the low 100s, WBC 17.6, BUN 42 creatinine 1.52 with a baseline creatinine of 1.1-1.3.  Of note, he does admit to reducing his flecainide from 100 to 50 mg twice daily because it is very expensive.  He did this about 1 to 2 weeks ago. Found to have a full thickness tear of quadriceps tendon and orthostatic hypotension.   Subjective:  No complaints.   Assessment and Plan: Principal Problem:   Recurrent falls-inability to bear weight on right knee -Steroid injection to right knee on 4/8 - Swelling noted right above the right knee with tenderness along with numerous fading bruises - X-ray of the knee from 4/9 suggests a soft tissue fullness in the suprapatellar bursa suggesting moderate to large effusion - MRI shows full thickness quad tear with tendon retraction of 2.4 cm and mild to mod  semimembranosus-tibial collateral ligament bursitis    Active Problems: Dehydration and orthostatic hypotension -The patient states he has had an extensive workup for his falls and no one can find out why he is falling - She does wear a loop recorder and takes flecainide for his arrhythmias - The patient does take HCTZ at home but does not feel that he is dehydrated - Orthostatic vitals are significantly positive and as follows 139/91 with heart rate of 80, sitting 148/89 with heart rate of 84, standing 128/67 with heart rate of 95 and standing on 3 minutes 98/83 with heart rate of 121 - resolved- cont to hold HCTZ  AKI, mild hyponatremia - Creatinine 1.34 at baseline - Creatinine 1.58 on 07/08/22 has improved to normal -See above  Leukocytosis - Resolved without antibiotics  History of atrial fibrillation - The patient takes flecainide as ordered by Dr. Jacinto Halim- resume home dosage and advised not to cut in half - He is not on anticoagulation-according to prior cardiology note, when he was monitored with a Zio patch he never had more than 2 to 3 hours of atrial fibrillation and does not need anticoagulation-   PVD, carotid endarterectomy and CVA - Continue aspirin and Crestor      Code Status: Full Code Consultants: Orthopedic surgery Level of Care: Level of care: Med-Surg Total time on patient care: 35 minutes DVT prophylaxis: Lovenox  Objective:   Vitals:   07/12/22 1455 07/12/22 1500 07/12/22 1505 07/12/22 1510  BP: 130/61 129/69 (!) 133/58 (!) 140/49  Pulse: (!) 58 63 69 66  Resp: Temp:      TempSrc:      SpO2: 95% 95% 95% 95%  Weight:      Height:       Filed Weights   07/08/22 1604 07/12/22 1247  Weight: 88 kg 88 kg   Exam: General exam: Appears comfortable  HEENT: oral mucosa moist Respiratory system: Clear to auscultation.  Cardiovascular system: S1 & S2 heard  Gastrointestinal system: Abdomen soft, non-tender, nondistended. Normal bowel  sounds   Extremities: No cyanosis, clubbing or edema- swelling above right knee noted Psychiatry:  Mood & affect appropriate.    CBC: Recent Labs  Lab 07/08/22 1706 07/08/22 2045 07/09/22 0443 07/10/22 0512 07/12/22 0451  WBC 17.6* 15.9* 11.1* 10.1 11.5*  NEUTROABS 13.2*  --   --   --   --   HGB 13.5 13.0 12.5* 12.0* 11.8*  HCT 39.3 37.8* 36.7* 35.4* 35.1*  MCV 90.8 90.0 92.0 92.9 92.1  PLT 330 309 291 290 265    Basic Metabolic Panel: Recent Labs  Lab 07/08/22 1706 07/08/22 2045 07/09/22 0443 07/10/22 0512 07/11/22 0454 07/12/22 0451  NA 134*  --  134* 138 136 136  K 4.1  --  3.6 3.9 4.4 4.0  CL 103  --  105 108 105 105  CO2 22  --  GLUCOSE 119*  --  140* 108* 106* 105*  BUN 42*  --  36* 35* 36* 31*  CREATININE 1.52* 1.58* 1.36* 1.07 1.10 1.09  CALCIUM 8.7*  --  8.3* 8.5* 8.5* 8.6*  MG  --   --  2.4  --   --   --   PHOS  --   --  3.7  --   --   --     GFR: Estimated Creatinine Clearance: 56.4 mL/min (by C-G formula based on SCr of 1.09 mg/dL).  Scheduled Meds:  [MAR Hold] aspirin  81 mg Oral Daily   chlorhexidine  60 mL Topical Once   [MAR Hold] DULoxetine  30 mg Oral QHS   [MAR Hold] enoxaparin (LOVENOX) injection  40 mg Subcutaneous Q24H   [MAR Hold] flecainide  100 mg Oral BID   [MAR Hold] losartan  50 mg Oral Daily   [MAR Hold]  morphine injection  4 mg Intravenous Once   povidone-iodine  2 Application Topical Once   povidone-iodine  2 Application Topical Once   [MAR Hold] rosuvastatin  10 mg Oral QHS   [MAR Hold] sertraline  50 mg Oral Daily   Continuous Infusions:  sodium chloride 50 mL/hr at 07/12/22 0825   sodium chloride      ceFAZolin (ANCEF) IV      Imaging and lab data was personally reviewed No results found.  LOS: 4 days   Author: Calvert Cantor  07/12/2022 4:32 PM  To contact [facility]>   Check the care team in [facility] and look for the attending/consulting [facility] provider listed  Log into [email or web address] and use [facility]'s universal password   Go to> "[facility]"  and find provider  If you still have difficulty reaching the provider, please page the [facility] (Director on Call) for the Hospitalists listed on amion

## 2022-07-12 NOTE — Progress Notes (Signed)
Patient ID: Brandon Mendoza, male   DOB: 12/04/1940, 82 y.o.   MRN: [id number]  Very pleasant 82-year-old male known to me from outpatient management of his knee arthritis.  He presented to the hospital over the weekend after a fall. He currently is in no acute distress. He does have some complaints of left proximal arm pain but is predominantly focusing his attention on his right lower extremity and inability to raise his right leg.  Examination: Awake alert and oriented again in no acute distress He does have some ecchymosis in his left upper extremity however has normal active and passive range of motion of left shoulder  Right lower extremity exam: He has a palpable defect proximal to his patella associated with ecchymosis and swelling in the area. Inability to perform straight leg raise  Imaging: MRI of his right knee was performed while in the hospital confirming clinical suspicion of a quadricep tendon rupture.  Assessment: Right acute quadricep tendon rupture  Plan: Today reviewed the condition of his right knee and the injury that has been sustained.  He has a right quadricep tendon rupture.  Will plan on taking him to the operating room today for an open repair of the quadricep tendon.  Postoperatively he will be in full extension with a knee brace for 4 to 6 weeks to allow for quadricep tendon healing followed by physical therapy.  Immediate postoperative course will be determined based on physical therapy and his mobility limitations.  Disposition may necessitate consideration of a short-term skilled nursing facility. N.p.o. Preoperative orders will be placed including consent. 

## 2022-07-12 NOTE — Progress Notes (Signed)
Jerel Shepherd, Mubashir's wife, to say that she could not take care of Dorian after the surgery because of health problems. She asked if he could go to [facility] for rehab because he has been there for previous rehab.   I was in the room this morning when Dr. Charlann Boxer told Mr. Googe he would most likely need to go to a facility for rehab. Aundra Millet, from social work, is aware of the situation.

## 2022-07-12 NOTE — H&P (View-Only) (Signed)
Patient ID: Brandon Mendoza, male   DOB: [birth year], 82 y.o.   MRN: [id number]  Very pleasant 82 year old male known to me from outpatient management of his knee arthritis.  He presented to the hospital over the weekend after a fall. He currently is in no acute distress. He does have some complaints of left proximal arm pain but is predominantly focusing his attention on his right lower extremity and inability to raise his right leg.  Examination: Awake alert and oriented again in no acute distress He does have some ecchymosis in his left upper extremity however has normal active and passive range of motion of left shoulder  Right lower extremity exam: He has a palpable defect proximal to his patella associated with ecchymosis and swelling in the area. Inability to perform straight leg raise  Imaging: MRI of his right knee was performed while in the hospital confirming clinical suspicion of a quadricep tendon rupture.  Assessment: Right acute quadricep tendon rupture  Plan: Today reviewed the condition of his right knee and the injury that has been sustained.  He has a right quadricep tendon rupture.  Will plan on taking him to the operating room today for an open repair of the quadricep tendon.  Postoperatively he will be in full extension with a knee brace for 4 to 6 weeks to allow for quadricep tendon healing followed by physical therapy.  Immediate postoperative course will be determined based on physical therapy and his mobility limitations.  Disposition may necessitate consideration of a short-term skilled nursing facility. N.p.o. Preoperative orders will be placed including consent.

## 2022-07-12 NOTE — Anesthesia Preprocedure Evaluation (Addendum)
Anesthesia Evaluation  Patient identified by MRN, date of birth, ID band Patient awake    Reviewed: Allergy & Precautions, NPO status , Patient's Chart, lab work & pertinent test results  Airway Mallampati: II  TM Distance: >3 FB Neck ROM: Full  Mouth opening: Limited Mouth Opening  Dental no notable dental hx. (+) Teeth Intact, Dental Advisory Given   Pulmonary sleep apnea , COPD, former smoker   Pulmonary exam normal breath sounds clear to auscultation       Cardiovascular hypertension, Pt. on medications + Peripheral Vascular Disease  Normal cardiovascular exam+ dysrhythmias (s/p Loop) Atrial Fibrillation  Rhythm:Regular Rate:Normal     Neuro/Psych  PSYCHIATRIC DISORDERS Anxiety Depression    CVA    GI/Hepatic ,GERD  ,,(+)     substance abuse  alcohol use  Endo/Other  negative endocrine ROS    Renal/GU negative Renal ROS  negative genitourinary   Musculoskeletal  (+) Arthritis ,    Abdominal   Peds  Hematology negative hematology ROS (+)   Anesthesia Other Findings  82 year old male with hypertension, COPD, remote stroke with left carotid endarterectomy, OSA on CPAP, burned-out spinal stenosis and peripheral neuropathy, paroxysmal atrial fibrillation, loop recorder for history of bradycardia and frequent falls (10/22/2020) hyperlipidemi  Reproductive/Obstetrics                             Anesthesia Physical Anesthesia Plan  ASA: 3  Anesthesia Plan: Spinal and Regional   Post-op Pain Management: Tylenol PO (pre-op)* and Regional block*   Induction:   PONV Risk Score and Plan: 1 and Treatment may vary due to age or medical condition, Propofol infusion and Ondansetron  Airway Management Planned: Natural Airway  Additional Equipment:   Intra-op Plan:   Post-operative Plan:   Informed Consent: I have reviewed the patients History and Physical, chart, labs and discussed the  procedure including the risks, benefits and alternatives for the proposed anesthesia with the patient or authorized representative who has indicated his/her understanding and acceptance.     Dental advisory given  Plan Discussed with: CRNA  Anesthesia Plan Comments:        Anesthesia Quick Evaluation

## 2022-07-12 NOTE — Anesthesia Procedure Notes (Signed)
Anesthesia Regional Block: Femoral nerve block   Pre-Anesthetic Checklist: , timeout performed,  Correct Patient, Correct Site, Correct Laterality,  Correct Procedure, Correct Position, site marked,  Risks and benefits discussed,  Pre-op evaluation,  At surgeon's request and post-op pain management  Laterality: Right  Prep: Maximum Sterile Barrier Precautions used, chloraprep       Needles:  Injection technique: Single-shot  Needle Type: Echogenic Stimulator Needle     Needle Length: 9cm  Needle Gauge: 21     Additional Needles:   Procedures:,,,, ultrasound used (permanent image in chart),,    Narrative:  Start time: 07/12/2022 2:45 PM End time: 07/12/2022 2:48 PM Injection made incrementally with aspirations every 5 mL. Anesthesiologist: Elmer Picker, MD

## 2022-07-12 NOTE — Progress Notes (Signed)
OT Cancellation Note  Patient Details Name: Brandon Mendoza MRN: [id number] DOB: 1940-03-30   Cancelled Treatment:    Reason Eval/Treat Not Completed: Patient not medically ready Patient is scheduled for quad tendon repair today. OT to continue to follow and check back after surgery for updated orders.  Rosalio Loud, MS Acute Rehabilitation Department Office# [phone]  07/12/2022, 11:12 AM

## 2022-07-13 ENCOUNTER — Encounter (HOSPITAL_COMMUNITY): Payer: Self-pay | Admitting: Orthopedic Surgery

## 2022-07-13 DIAGNOSIS — R296 Repeated falls: Secondary | ICD-10-CM | POA: Diagnosis not present

## 2022-07-13 LAB — BASIC METABOLIC PANEL
Anion gap: 7 (ref 5–15)
BUN: 28 mg/dL — ABNORMAL HIGH (ref 8–23)
CO2: 21 mmol/L — ABNORMAL LOW (ref 22–32)
Calcium: 7.5 mg/dL — ABNORMAL LOW (ref 8.9–10.3)
Chloride: 109 mmol/L (ref 98–111)
Creatinine, Ser: 0.97 mg/dL (ref 0.61–1.24)
GFR, Estimated: >60 mL/min (ref >60)
Glucose, Bld: 159 mg/dL — ABNORMAL HIGH (ref 70–99)
Potassium: 3.8 mmol/L (ref 3.5–5.1)
Sodium: 137 mmol/L (ref 135–145)

## 2022-07-13 LAB — CBC
HCT: 31.7 % — ABNORMAL LOW (ref 39.0–52.0)
Hemoglobin: 10.7 g/dL — ABNORMAL LOW (ref 13.0–17.0)
MCH: 31.2 pg (ref 26.0–34.0)
MCHC: 33.8 g/dL (ref 30.0–36.0)
MCV: 92.4 fL (ref 80.0–100.0)
Platelets: 248 10*3/uL (ref 150–400)
RBC: 3.43 MIL/uL — ABNORMAL LOW (ref 4.22–5.81)
RDW: 13.2 % (ref 11.5–15.5)
WBC: 8.7 10*3/uL (ref 4.0–10.5)
nRBC: 0 % (ref 0.0–0.2)

## 2022-07-13 MED ORDER — POLYETHYLENE GLYCOL 3350 17 G PO PACK: 17.0000 g | PACK | Freq: Two times a day (BID) | ORAL | 0 refills | Status: DC

## 2022-07-13 MED ORDER — ASPIRIN 81 MG PO CHEW: 81.0000 mg | CHEWABLE_TABLET | Freq: Two times a day (BID) | ORAL | 0 refills | Status: AC

## 2022-07-13 MED ORDER — OXYCODONE HCL 5 MG PO TABS: 5.0000 mg | ORAL_TABLET | ORAL | 0 refills | Status: DC | PRN

## 2022-07-13 MED ORDER — METHOCARBAMOL 500 MG PO TABS: 500.0000 mg | ORAL_TABLET | Freq: Four times a day (QID) | ORAL | 0 refills | Status: DC | PRN

## 2022-07-13 MED ORDER — LOSARTAN POTASSIUM 25 MG PO TABS
25.0000 mg | ORAL_TABLET | Freq: Every day | ORAL | Status: DC
  Administered 2022-07-14 – 2022-07-15 (×2): 25 mg via ORAL
  Filled 2022-07-13 (×2): qty 1

## 2022-07-13 NOTE — Progress Notes (Signed)
Subjective: 1 Day Post-Op Procedure(s) (LRB): REPAIR QUADRICEP TENDON (Right) Patient reports pain as mild.   Patient seen in rounds by Dr. Charlann Boxer. Patient is well, and has had no acute complaints or problems. No acute events overnight. Has not been up with PT yet. Prefers to go to [facility].  We will start therapy today.   Objective: Vital signs in last 24 hours: Temp:  [97.4 F (36.3 C)-98 F (36.7 C)] 97.8 F (36.6 C) (04/17 0610) Pulse Rate:  [58-92] 69 (04/17 0610) Resp:  [10-20] 18 (04/17 0610) BP: (107-140)/(49-95) 116/85 (04/17 0610) SpO2:  [93 %-100 %] 98 % (04/17 0610) FiO2 (%):  [21 %] 21 % (04/16 1942) Weight:  [88 kg] 88 kg (04/16 1247)  Intake/Output from previous day:  Intake/Output Summary (Last 24 hours) at 07/13/2022 0744 Last data filed at 07/13/2022 0610 Gross per 24 hour  Intake 2293.53 ml  Output 1625 ml  Net 668.53 ml     Intake/Output this shift: No intake/output data recorded.  Labs: Recent Labs    07/12/22 0451 07/13/22 0440  HGB 11.8* 10.7*   Recent Labs    07/12/22 0451 07/13/22 0440  WBC 11.5* 8.7  RBC 3.81* 3.43*  HCT 35.1* 31.7*  PLT 265 248   Recent Labs    07/12/22 0451 07/13/22 0440  NA 136 137  K 4.0 3.8  CL 105 109  CO2 25 21*  BUN 31* 28*  CREATININE 1.09 0.97  GLUCOSE 105* 159*  CALCIUM 8.6* 7.5*   No results for input(s): "LABPT", "INR" in the last 72 hours.  Exam: General - Patient is Alert and Oriented Extremity - Neurologically intact Sensation intact distally Intact pulses distally Dorsiflexion/Plantar flexion intact Dressing - dressing C/D/I, knee immobilizer in place Motor Function - intact, moving foot and toes well on exam.   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   The spinal cord appears normal. 2.   No spinal stenosis. 3.   Mild multilevel degenerative changes as detailed above that do not lead to spinal stenosis or nerve root compression. 4.   T2 hyperintense foci within the pons consistent with chronic microvascular ischemic changes.   Loop - [employer] Linq 10/22/2020 10/22/2020   Nocturia    Peripheral vascular disease    Sleep apnea    on 6 cm, nasal pillow   Stroke 05/01/2011   ischemic   Weakness generalized 10/21/2021   Severe, legs and arms    Assessment/Plan: 1 Day Post-Op Procedure(s) (LRB): REPAIR QUADRICEP TENDON (Right) Principal Problem:   Recurrent falls Active Problems:   Mixed hyperlipidemia   Essential hypertension   Leukocytosis   Weakness generalized   Dehydration   Depression   COPD (chronic obstructive pulmonary disease)   Quadriceps tendon rupture, right,  initial encounter  Estimated body mass index is 28.65 kg/m as calculated from the following:   Height as of this encounter:  (1.753 m).   Weight as of this encounter: 88 kg. Advance diet Up with therapy    DVT Prophylaxis - Aspirin Weight bearing as tolerated. Bledsoe/TROM to be fitted for patient before leaving Knee in extension at all times.  Do not use the right leg to push up out of a chair.  ACE wrap can be removed at any point.   Patient wishes to go to SNF at discharge based on limited help at home.  Follow up in our office in 2 weeks.  Ready for discharge from orthopaedic standpoint whenever medically ready with bed available.    Dennie Bible,  PA-C Orthopedic Surgery [phone] 07/13/2022, 7:44 AM

## 2022-07-13 NOTE — [place] FL2 (Signed)
[place] MEDICAID FL2 LEVEL OF CARE FORM     IDENTIFICATION  Patient Name: Brandon Mendoza Birthdate: May 11, 1940 Sex: male Admission Date (Current Location): 07/08/2022  [facility] and [place] Number:  Producer, television/film/video and Address:  [facility],  [address] [place], [place] [address]      Provider Number: [id number]  Attending Physician Name and Address:  Alwyn Ren, MD  Relative Name and Phone Number:  Godson Pollan (spouse) Ph: [phone]    Current Level of Care: Hospital Recommended Level of Care: Skilled Nursing Facility Prior Approval Number:    Date Approved/Denied:   PASRR Number: [id number]  Discharge Plan: SNF    Current Diagnoses: Patient Active Problem List   Diagnosis Date Noted   Quadriceps tendon rupture, right, initial encounter 07/12/2022   Unable to care for self 07/08/2022   Dehydration 07/08/2022   Depression 07/08/2022   COPD (chronic obstructive pulmonary disease) 07/08/2022   Macular degeneration 03/01/2022   Myofascial pain 02/10/2022   Lumbar spondylosis 01/03/2022   Acquired clawfoot, left foot 12/16/2021   Acquired clawfoot, right foot 12/16/2021   Claw foot 12/14/2021   Gait abnormality 12/10/2021   Spinal stenosis at L4-L5 level 12/10/2021   Ataxia 11/22/2021   Lightheadedness 11/02/2021   Claudication of lower extremity 10/21/2021   Atrophy of calf muscles 10/21/2021   Weakness generalized 10/21/2021   Focal neurological deficit 10/21/2021   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   Pain in joint of right hip 09/14/2021   High arches 07/22/2021   Hammertoes of both feet 07/22/2021   Neuropathy 07/22/2021   Constipation 05/28/2021   Disequilibrium 05/28/2021   Personal history of colonic polyps 05/28/2021   Renal cyst 05/28/2021   Gastroesophageal reflux disease without esophagitis 05/05/2021   Ingrowing left great toenail 02/15/2021   S/P lumbar fusion 12/24/2020   Spinal  stenosis of lumbar region 12/04/2020   Acquired complex renal cyst 11/23/2020   Functional gait abnormality 11/04/2020   Idiopathic neuropathy 11/02/2020   Tinea pedis of both feet 10/28/2020   Loop - Medtronic Linq 10/22/2020 10/22/2020   Benign hypertension with CKD (chronic kidney disease) stage III 10/05/2020   Fall with injury 10/05/2020   Hematoma 09/16/2020   Normocytic anemia 09/14/2020   Leukocytosis 09/14/2020   AF (paroxysmal atrial fibrillation) 09/14/2020   Chronic anticoagulation 09/14/2020   Hyponatremia 09/14/2020   Pain in left foot 01/22/2020   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   Pain in joint of right knee 12/27/2019   Recurrent falls 11/17/2019   Paroxysmal atrial fibrillation 10/22/2019   Pain in right foot 10/14/2019   Falls 10/07/2019   Ataxia involving legs 08/12/2019   OSA on CPAP 06/20/2018   Urge incontinence 04/13/2018   Urinary urgency 04/13/2018   Pain in left knee 03/01/2018   Other intervertebral disc degeneration, lumbar region 06/16/2017   Acquired bilateral hammer toes 10/05/2015   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   Generalized anxiety disorder 05/12/2015   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 04/10/2014   History of left-sided carotid endarterectomy 04/10/2014   Essential hypertension 04/10/2014   Occlusion and stenosis of carotid artery without mention of cerebral infarction 12/15/2011   Habitual alcohol use 12/01/2011   Carotid artery stenosis, symptomatic 11/30/2011   Cerebral infarction (HCC) 11/29/2011   Mixed hyperlipidemia     Orientation RESPIRATION BLADDER Height & Weight     Self, Time, Situation, Place  Normal  Continent Weight: 194 lb 0.1 oz (88 kg) Height:   (175.3 cm)  BEHAVIORAL SYMPTOMS/MOOD NEUROLOGICAL BOWEL NUTRITION STATUS   (N/A)  (N/A) Continent Diet (Regular diet)  AMBULATORY STATUS COMMUNICATION OF NEEDS Skin    Limited Assist Verbally Surgical wounds, Skin abrasions, Other (Comment) (Abrasion: bilateral legs; Ecchymosis: bilateral legs, arms)                       Personal Care Assistance Level of Assistance  Bathing, Feeding, Dressing Bathing Assistance: Limited assistance Feeding assistance: Independent Dressing Assistance: Limited assistance     Functional Limitations Info  Sight, Hearing, Speech Sight Info: Adequate Hearing Info: Adequate Speech Info: Adequate    SPECIAL CARE FACTORS FREQUENCY  PT (By licensed PT), OT (By licensed OT)     PT Frequency: 5x's/week OT Frequency: 5x's/week            Contractures Contractures Info: Not present    Additional Factors Info  Code Status, Allergies, Psychotropic Code Status Info: Full Allergies Info: Sulfamethoxazole-trimethoprim Psychotropic Info: Zoloft, Cymbalta         Current Medications (07/13/2022):  This is the current hospital active medication list Current Facility-Administered Medications  Medication Dose Route Frequency Provider Last Rate Last Admin   0.9 %  sodium chloride infusion   Intravenous Continuous Durene Romans, MD 50 mL/hr at 07/12/22 0825 New Bag at 07/12/22 0825   0.9 %  sodium chloride infusion   Intravenous Continuous Cassandria Anger, PA-C 20 mL/hr at 07/13/22 0618 Rate Change at 07/13/22 0618   acetaminophen (TYLENOL) tablet 1,000 mg  1,000 mg Oral Q6H Cassandria Anger, PA-C   1,000 mg at 07/13/22 1610   acetaminophen (TYLENOL) tablet 325-650 mg  325-650 mg Oral Q6H PRN Cassandria Anger, PA-C       albuterol (PROVENTIL) (2.5 MG/3ML) 0.083% nebulizer solution 2.5 mg  2.5 mg Nebulization Q6H PRN Cassandria Anger, PA-C       aspirin chewable tablet 81 mg  81 mg Oral BID Cassandria Anger, PA-C   81 mg at 07/12/22 2102   bisacodyl (DULCOLAX) suppository 10 mg  10 mg Rectal Daily PRN Cassandria Anger, PA-C       diphenhydrAMINE (BENADRYL) 12.5 MG/5ML elixir 12.5-25 mg  12.5-25 mg Oral Q4H PRN  Cassandria Anger, PA-C       docusate sodium (COLACE) capsule 100 mg  100 mg Oral BID Cassandria Anger, PA-C   100 mg at 07/12/22 2102   DULoxetine (CYMBALTA) DR capsule 30 mg  30 mg Oral QHS Cassandria Anger, PA-C   30 mg at 07/12/22 2102   flecainide (TAMBOCOR) tablet 100 mg  100 mg Oral BID Cassandria Anger, PA-C   100 mg at 07/13/22 9604   HYDROmorphone (DILAUDID) injection 0.5-1 mg  0.5-1 mg Intravenous Q4H PRN Cassandria Anger, PA-C       losartan (COZAAR) tablet 50 mg  50 mg Oral Daily Cassandria Anger, PA-C   50 mg at 07/13/22 5409   menthol-cetylpyridinium (CEPACOL) lozenge 3 mg  1 lozenge Oral PRN Cassandria Anger, PA-C       Or   phenol (CHLORASEPTIC) mouth spray 1 spray  1 spray Mouth/Throat PRN Cassandria Anger, PA-C       methocarbamol (ROBAXIN) tablet 500 mg  500 mg Oral Q6H PRN Rosalene Billings R, PA-C   500 mg at 07/12/22 2102   Or   methocarbamol (ROBAXIN) 500 mg in dextrose 5 %  50 mL IVPB  500 mg Intravenous Q6H PRN Cassandria Anger, PA-C       metoCLOPramide (REGLAN) tablet 5-10 mg  5-10 mg Oral Q8H PRN Cassandria Anger, PA-C       Or   metoCLOPramide (REGLAN) injection 5-10 mg  5-10 mg Intravenous Q8H PRN Cassandria Anger, PA-C       ondansetron [facility]) tablet 4 mg  4 mg Oral Q6H PRN Cassandria Anger, PA-C       Or   ondansetron [facility]) injection 4 mg  4 mg Intravenous Q6H PRN Cassandria Anger, PA-C       oxyCODONE (Oxy IR/ROXICODONE) immediate release tablet 10 mg  10 mg Oral Q4H PRN Cassandria Anger, PA-C       oxyCODONE (Oxy IR/ROXICODONE) immediate release tablet 5 mg  5 mg Oral Q4H PRN Rosalene Billings R, PA-C       polyethylene glycol (MIRALAX / GLYCOLAX) packet 17 g  17 g Oral BID Cassandria Anger, PA-C   17 g at 07/13/22 0940   rosuvastatin (CRESTOR) tablet 10 mg  10 mg Oral QHS Cassandria Anger, PA-C   10 mg at 07/12/22 2102   sertraline (ZOLOFT) tablet 50 mg  50 mg Oral Daily Cassandria Anger, PA-C   50 mg at 07/13/22 1610     Discharge  Medications: Please see discharge summary for a list of discharge medications.  Relevant Imaging Results:  Relevant Lab Results:   Additional Information SSN: [id number]  Ewing Schlein, LCSW

## 2022-07-13 NOTE — Progress Notes (Signed)
Physical Therapy Treatment Patient Details Name: Brandon Mendoza MRN: [id number] DOB: 06-23-1940 Today's Date: 07/13/2022   History of Present Illness Patient is a 82 year old male who presented with decreased ability to walk with h/o fall on 4/9. Patient on 4/9 was diagnosed with ligamentous injury of R knee and placed in KI with patient declining therapy and transitioning home. ortho consulted, MRI ordered and confirmed Full-thickness tear of the distal quadriceps insertion on the  patella with up to 2.4 cm tendon retraction. Decreased signal likely subacute to older blood products within  the suprapatellar joint fluid, extending through the distal  quadriceps.  Currently, patient is reporting being unable to take care of himself at this level with four more additional falls since 4/9. 4/16 s/p repair R quadricep tendon.  PMH: anxiety, kyphosis, ischemic stroke, HTN, COPD. L4-5 PLIF 9/22    PT Comments    Pt up in recliner, motivated to mobilize. Pt with good ankle activation, denies numbness/tingling, active LLE SLR and good bil glute activation. Pt educated on hand placement and foot positioning with powering up to stand, avoiding pushing with RLE, needing mod A to power up to standing and shift weight anterior for all reps. Pt also needing education and cues for RLE positioning with return to sitting. Pt takes a few steps forward, noted to have RLE weakness noted, bledsoe brace on and locked in extension, possible glute weakness noted in weight-bearing. Pt also with interesting LLE advancement and weight-bearing, appears to have some closed kinetic chain weakness. Pt does reports chronic BLE weakness for ~3 years with BLE buckling and giving way causing falls spontaneously. Pt is unsteady with limited steps, is a high fall risk and would benefit from post-acute PT.   Recommendations for follow up therapy are one component of a multi-disciplinary discharge planning process, led by the attending  physician.  Recommendations may be updated based on patient status, additional functional criteria and insurance authorization.  Follow Up Recommendations  Can patient physically be transported by private vehicle: Yes    Assistance Recommended at Discharge Frequent or constant Supervision/Assistance  Patient can return home with the following A little help with walking and/or transfers;A little help with bathing/dressing/bathroom;Assistance with cooking/housework;Assist for transportation;Help with stairs or ramp for entrance   Equipment Recommendations  None recommended by PT    Recommendations for Other Services       Precautions / Restrictions Precautions Precautions: Fall Required Braces or Orthoses: Other Brace Other Brace: BLEDSOE on at all times Restrictions Weight Bearing Restrictions: No RLE Weight Bearing: Weight bearing as tolerated Other Position/Activity Restrictions: "Knee in extension at all times. Do not use the right leg to push up out of a chair." per PA note 4/17     Mobility  Bed Mobility  General bed mobility comments: in recliner    Transfers Overall transfer level: Needs assistance Equipment used: Rolling walker (2 wheels) Transfers: Sit to/from Stand Sit to Stand: Mod assist  General transfer comment: mod A to power up to stand and shift weight anterior with BUE assisting, RLE extended on bledsoe brace off of floor not assisting to power up    Ambulation/Gait Ambulation/Gait assistance: Mod assist Gait Distance (Feet): 2 Feet (x2) Assistive device: Rolling walker (2 wheels) Gait Pattern/deviations: Step-to pattern, Decreased stance time - right, Decreased weight shift to right Gait velocity: decreased  General Gait Details: slow step-to gait pattern, noted RLE weakness despite bledsoe brace locked in extension, also with some LLE weakness noted, pt reports chronic  BLE weakness and spontaneous buckling   Stairs             Wheelchair  Mobility    Modified Rankin (Stroke Patients Only)       Balance Overall balance assessment: Needs assistance  Standing balance support: Reliant on assistive device for balance, Bilateral upper extremity supported, During functional activity Standing balance-Leahy Scale: Poor     Cognition Arousal/Alertness: Awake/alert Behavior During Therapy: WFL for tasks assessed/performed Overall Cognitive Status: Within Functional Limits for tasks assessed  General Comments: pt pleasant, wife present        Exercises General Exercises - Lower Extremity Ankle Circles/Pumps: AROM, Both, 10 reps    General Comments        Pertinent Vitals/Pain Pain Assessment Pain Assessment: No/denies pain    Home Living                          Prior Function            PT Goals (current goals can now be found in the care plan section) Acute Rehab PT Goals Patient Stated Goal: Regain IND PT Goal Formulation: With patient Time For Goal Achievement: 07/23/22 Potential to Achieve Goals: Fair Progress towards PT goals: Progressing toward goals    Frequency    Min 1X/week      PT Plan Current plan remains appropriate    Co-evaluation              AM-PAC PT "6 Clicks" Mobility   Outcome Measure  Help needed turning from your back to your side while in a flat bed without using bedrails?: A Little Help needed moving from lying on your back to sitting on the side of a flat bed without using bedrails?: A Little Help needed moving to and from a bed to a chair (including a wheelchair)?: A Little Help needed standing up from a chair using your arms (e.g., wheelchair or bedside chair)?: A Lot Help needed to walk in hospital room?: Total Help needed climbing 3-5 steps with a railing? : Total 6 Click Score: 13    End of Session Equipment Utilized During Treatment: Gait belt Activity Tolerance: Patient tolerated treatment well Patient left: in chair;with call bell/phone  within reach;with chair alarm set;with family/visitor present Nurse Communication: Mobility status PT Visit Diagnosis: Unsteadiness on feet (R26.81);Muscle weakness (generalized) (M62.81);History of falling (Z91.81);Difficulty in walking, not elsewhere classified (R26.2)     Time: 1610-9604 PT Time Calculation (min) (ACUTE ONLY): 26 min  Charges:  $Gait Training: 8-22 mins $Therapeutic Activity: 8-22 mins                      Tori Nathanie Ottley PT, DPT 07/13/22, 1:11 PM

## 2022-07-13 NOTE — Progress Notes (Signed)
PROGRESS NOTE    Brandon Mendoza  ONG:[id number] DOB: Oct 27, 1940 DOA: 07/08/2022 PCP: Lula Olszewski, MD   Brief Narrative: 82 year old male with history of spinal stenosis, peripheral neuropathy, remote stroke with left carotid endarterectomy, obstructive sleep apnea on CPAP, hypertension, COPD, paroxysmal atrial fibrillation, frequent falls admitted with right quadriceps tendon tear after a fall. Mri-shows full thickness quad tear with tendon retraction of 2.4 cm and mild to mod semimembranosus-tibial collateral ligament bursitis   Assessment & Plan:   Principal Problem:   Recurrent falls Active Problems:   Mixed hyperlipidemia   Essential hypertension   Leukocytosis   Weakness generalized   Dehydration   Depression   COPD (chronic obstructive pulmonary disease)   Quadriceps tendon rupture, right, initial encounter   #1 right quadriceps tendon tear status post repair 07/12/2022 Dr. Charlann Boxer Seen by physical therapy Patient feels the need to go to SNF with history of recurrent falls and stroke and recent surgery DVT prophylaxis with aspirin per Ortho Weightbearing as tolerated Need to be in extension at all times and not to push the right leg to get out of chair or bed  #2 orthostatic hypotension with recurrent falls resolved continue to hold HCTZ on discharge Blood pressure still on the soft side to normal  #3 history of atrial fibrillation-continue flecainide Not on anticoagulation since Zio patch never had more than 2 to 3 hours of A-fib per cardiology Dr. Jacinto Halim  #4 history of PVD and stroke on aspirin and statin  #5 leukocytosis resolved without antibiotics  #6 AKI resolved  #7 hypertension decrease Cozaar to 25 mg daily with orthostatic hypotension and blood pressure still being on the soft side.  Estimated body mass index is 28.65 kg/m as calculated from the following:   Height as of this encounter:  (1.753 m).   Weight as of this encounter: 88 kg.  DVT  prophylaxis: Aspirin Code Status: Full code Family Communication: None at bedside Disposition Plan:  Status is: Inpatient Remains inpatient appropriate because: For SNF   Consultants: Ortho  Procedures: Right quadriceps tendon repair Antimicrobials: None  Subjective: Patient reports feeling better waiting for breakfast  Objective: Vitals:   07/13/22 0203 07/13/22 0610 07/13/22 1010 07/13/22 1226  BP: (!) 123/49 116/85 120/68 (!) 130/51  Pulse: 66 69 90 72  Resp: Temp: 97.7 F (36.5 C) 97.8 F (36.6 C) 97.8 F (36.6 C) 97.6 F (36.4 C)  TempSrc: Oral Oral Oral Oral  SpO2: 96% 98% 96% 97%  Weight:      Height:        Intake/Output Summary (Last 24 hours) at 07/13/2022 1354 Last data filed at 07/13/2022 1222 Gross per 24 hour  Intake 2493.98 ml  Output 1575 ml  Net 918.98 ml   Filed Weights   07/08/22 1604 07/12/22 1247  Weight: 88 kg 88 kg    Examination:  General exam: Appears calm and comfortable  Respiratory system: Clear to auscultation. Respiratory effort normal. Cardiovascular system: S1 & S2 heard, RRR. No JVD, murmurs, rubs, gallops or clicks. No pedal edema. Gastrointestinal system: Abdomen is nondistended, soft and nontender. No organomegaly or masses felt. Normal bowel sounds heard. Central nervous system: Alert and oriented. No focal neurological deficits. Extremities: Right lower ext covered in Ace wrap Skin: No rashes, lesions or ulcers Psychiatry: Judgement and insight appear normal. Mood & affect appropriate.     Data Reviewed: I have personally reviewed following labs and imaging studies  CBC: Recent Labs  Lab  07/08/22 1706 07/08/22 2045 07/09/22 0443 07/10/22 0512 07/12/22 0451 07/13/22 0440  WBC 17.6* 15.9* 11.1* 10.1 11.5* 8.7  NEUTROABS 13.2*  --   --   --   --   --   HGB 13.5 13.0 12.5* 12.0* 11.8* 10.7*  HCT 39.3 37.8* 36.7* 35.4* 35.1* 31.7*  MCV 90.8 90.0 92.0 92.9 92.1 92.4  PLT 330 309 291 290 265 248    Basic Metabolic Panel: Recent Labs  Lab 07/09/22 0443 07/10/22 0512 07/11/22 0454 07/12/22 0451 07/13/22 0440  NA 134* 138 136 136 137  K 3.6 3.9 4.4 4.0 3.8  CL 105 108 105 105 109  CO2 24 24 24 25  21*  GLUCOSE 140* 108* 106* 105* 159*  BUN 36* 35* 36* 31* 28*  CREATININE 1.36* 1.07 1.10 1.09 0.97  CALCIUM 8.3* 8.5* 8.5* 8.6* 7.5*  MG 2.4  --   --   --   --   PHOS 3.7  --   --   --   --    GFR: Estimated Creatinine Clearance: 63.3 mL/min (by C-G formula based on SCr of 0.97 mg/dL). Liver Function Tests: Recent Labs  Lab 07/08/22 1706 07/09/22 0443  AST 20 17  ALT 20 15  ALKPHOS 74 68  BILITOT 0.7 0.6  PROT 7.0 6.2*  ALBUMIN 3.9 3.4*   No results for input(s): "LIPASE", "AMYLASE" in the last 168 hours. No results for input(s): "AMMONIA" in the last 168 hours. Coagulation Profile: No results for input(s): "INR", "PROTIME" in the last 168 hours. Cardiac Enzymes: Recent Labs  Lab 07/08/22 1706  CKTOTAL 139   BNP (last 3 results) No results for input(s): "PROBNP" in the last 8760 hours. HbA1C: No results for input(s): "HGBA1C" in the last 72 hours. CBG: No results for input(s): "GLUCAP" in the last 168 hours. Lipid Profile: No results for input(s): "CHOL", "HDL", "LDLCALC", "TRIG", "CHOLHDL", "LDLDIRECT" in the last 72 hours. Thyroid Function Tests: No results for input(s): "TSH", "T4TOTAL", "FREET4", "T3FREE", "THYROIDAB" in the last 72 hours. Anemia Panel: No results for input(s): "VITAMINB12", "FOLATE", "FERRITIN", "TIBC", "IRON", "RETICCTPCT" in the last 72 hours. Sepsis Labs: No results for input(s): "PROCALCITON", "LATICACIDVEN" in the last 168 hours.  Recent Results (from the past 240 hour(s))  Surgical pcr screen     Status: None   Collection Time: 07/12/22 12:59 PM   Specimen: Nasal Mucosa; Nasal Swab  Result Value Ref Range Status   MRSA, PCR NEGATIVE NEGATIVE Final   Staphylococcus aureus NEGATIVE NEGATIVE Final    Comment: (NOTE) The Xpert  SA Assay (FDA approved for NASAL specimens in patients 67 years of age and older), is one component of a comprehensive surveillance program. It is not intended to diagnose infection nor to guide or monitor treatment. Performed at [facility], [address], [place], [place] [address]          Radiology Studies: No results found.      Scheduled Meds:  acetaminophen  1,000 mg Oral Q6H   aspirin  81 mg Oral BID   docusate sodium  100 mg Oral BID   DULoxetine  30 mg Oral QHS   flecainide  100 mg Oral BID   losartan  50 mg Oral Daily   polyethylene glycol  17 g Oral BID   rosuvastatin  10 mg Oral QHS   sertraline  50 mg Oral Daily   Continuous Infusions:  sodium chloride 50 mL/hr at 07/12/22 0825   sodium chloride 20 mL/hr at 07/13/22  6045   methocarbamol (ROBAXIN) IV       LOS: 5 days   Time spent:38 min  Alwyn Ren, MD 07/13/2022, 1:54 PM

## 2022-07-13 NOTE — TOC Progression Note (Signed)
Transition of Care [facility]) - Progression Note   Patient Details  Name: Brandon Mendoza MRN: [id number] Date of Birth: 03-16-1941  [facility] CM/SW Contact  Ewing Schlein, LCSW Phone Number: 07/13/2022, 1:48 PM  Clinical Narrative: PT and OT evaluations recommended SNF. Patient is agreeable to SNF and prefers a facility near his home, such as [facility]. FL2 done; PASRR confirmed. Initial referral faxed out. TOC awaiting bed offers.  Expected Discharge Plan: Skilled Nursing Facility (vs. home with home health) Barriers to Discharge: Continued Medical Work up, SNF Pending bed offer  Expected Discharge Plan and Services In-house Referral: Clinical Social Work Living arrangements for the past 2 months: Single Family Home          DME Arranged: N/A DME Agency: NA  Social Determinants of Health (SDOH) Interventions SDOH Screenings   Food Insecurity: No Food Insecurity (07/13/2022)  Housing: Low Risk  (07/13/2022)  Transportation Needs: No Transportation Needs (07/13/2022)  Utilities: Not At Risk (07/13/2022)  Depression (PHQ2-9): Low Risk  (12/10/2021)  Tobacco Use: Medium Risk (07/13/2022)   Readmission Risk Interventions    07/09/2022    1:44 PM  Readmission Risk Prevention Plan  Transportation Screening Complete  PCP or Specialist Appt within 5-7 Days Complete  Home Care Screening Complete  Medication Review (RN CM) Complete

## 2022-07-13 NOTE — Progress Notes (Signed)
Occupational Therapy Treatment Patient Details Name: Brandon Mendoza MRN: [id number] DOB: [birth year] Today's Date: 07/13/2022   History of present illness Patient is a 82 year old male who presented with decreased ability to walk with h/o fall on 4/9. Patient on 4/9 was diagnosed with ligamentous injury of R knee and placed in KI with patient declining therapy and transitioning home. ortho consulted, MRI ordered and confirmed Full-thickness tear of the distal quadriceps insertion on the  patella with up to 2.4 cm tendon retraction. Decreased signal likely subacute to older blood products within  the suprapatellar joint fluid, extending through the distal  quadriceps.  Currently, patient is reporting being unable to take care of himself at this level with four more additional falls since 4/9.   PMH: anxiety, kyphosis, ischemic stroke, HTN, COPD. L4-5 PLIF 9/22   OT comments  Patient was noted to have had surgery on 4/16 for repair of right quadricept tendon with recommendations for WBAT with Bledsoe brace locked in extension. Patient remains +2 for transfers with blood pressures noted below.  Patient's discharge plan remains appropriate at this time. OT will continue to follow acutely.   Blood pressure Supine; 126/58 mmhg HR 80 bpm Sitting: 135/66 mmhg HR 97 bpm Standing 120/68 mmhg HR 101 bpm unable to stand for 3 min test.    Recommendations for follow up therapy are one component of a multi-disciplinary discharge planning process, led by the attending physician.  Recommendations may be updated based on patient status, additional functional criteria and insurance authorization.    Assistance Recommended at Discharge Frequent or constant Supervision/Assistance  Patient can return home with the following  A lot of help with walking and/or transfers;A lot of help with bathing/dressing/bathroom;Assistance with cooking/housework;Direct supervision/assist for medications management;Assist for  transportation;Help with stairs or ramp for entrance;Direct supervision/assist for financial management   Equipment Recommendations  None recommended by OT       Precautions / Restrictions Precautions Precautions: Fall Required Braces or Orthoses: Other Brace Other Brace: BLEDSOE on at all times Restrictions Weight Bearing Restrictions: No RLE Weight Bearing: Weight bearing as tolerated       Mobility Bed Mobility Overal bed mobility: Needs Assistance Bed Mobility: Supine to Sit     Supine to sit: Min guard     General bed mobility comments: with increased time and cues to slow down; min/guard to safely guide RLE to floor      Balance Overall balance assessment: Needs assistance Sitting-balance support: Feet supported Sitting balance-Leahy Scale: Fair     Standing balance support: Reliant on assistive device for balance, Bilateral upper extremity supported Standing balance-Leahy Scale: Poor         ADL either performed or assessed with clinical judgement   ADL Overall ADL's : Needs assistance/impaired     Grooming: Set up;Sitting;Wash/dry face;Oral care Grooming Details (indicate cue type and reason): in recliner Upper Body Bathing: Set up;Sitting Upper Body Bathing Details (indicate cue type and reason): in recliner     Upper Body Dressing : Set up;Sitting       Toilet Transfer: +2 for physical assistance;+2 for safety/equipment;Moderate assistance;Rolling walker (2 wheels);Stand-pivot Statistician Details (indicate cue type and reason): patient was +2 for safety to transfer from edge of bed to recliner in room. patient was noted to have some shaikiness of BLE with buckling noted on LLE.                  Cognition Arousal/Alertness: Awake/alert Behavior During Therapy: WFL for tasks  assessed/performed Overall Cognitive Status: Within Functional Limits for tasks assessed         General Comments: pt very pleasant and cooperative, wife  present in room as well.                   Pertinent Vitals/ Pain       Pain Assessment Pain Assessment: Faces Faces Pain Scale: No hurt Pain Location: R knee with activity         Frequency  Min 2X/week        Progress Toward Goals  OT Goals(current goals can now be found in the care plan section)  Progress towards OT goals: Progressing toward goals     Plan Discharge plan remains appropriate       AM-PAC OT "6 Clicks" Daily Activity     Outcome Measure   Help from another person eating meals?: None Help from another person taking care of personal grooming?: A Little Help from another person toileting, which includes using toliet, bedpan, or urinal?: A Lot Help from another person bathing (including washing, rinsing, drying)?: A Lot Help from another person to put on and taking off regular upper body clothing?: A Little Help from another person to put on and taking off regular lower body clothing?: A Lot 6 Click Score: 16    End of Session Equipment Utilized During Treatment: Gait belt;Rolling walker (2 wheels);Other (comment) (R bleedsoe brace)  OT Visit Diagnosis: Unsteadiness on feet (R26.81);Other abnormalities of gait and mobility (R26.89);Muscle weakness (generalized) (M62.81)   Activity Tolerance Patient tolerated treatment well   Patient Left with call bell/phone within reach;with family/visitor present;in chair;with chair alarm set   Nurse Communication Other (comment) (ok to participate in session)        Time: 4098-1191 OT Time Calculation (min): 24 min  Charges: OT General Charges $OT Visit: 1 Visit OT Treatments $Self Care/Home Management : 23-37 mins  Rosalio Loud, MS Acute Rehabilitation Department Office# [phone]   Selinda Flavin 07/13/2022, 11:39 AM

## 2022-07-14 DIAGNOSIS — R296 Repeated falls: Secondary | ICD-10-CM | POA: Diagnosis not present

## 2022-07-14 LAB — CBC
HCT: 34.1 % — ABNORMAL LOW (ref 39.0–52.0)
Hemoglobin: 11.5 g/dL — ABNORMAL LOW (ref 13.0–17.0)
MCH: 30.7 pg (ref 26.0–34.0)
MCHC: 33.7 g/dL (ref 30.0–36.0)
MCV: 91.2 fL (ref 80.0–100.0)
Platelets: 285 10*3/uL (ref 150–400)
RBC: 3.74 MIL/uL — ABNORMAL LOW (ref 4.22–5.81)
RDW: 13.2 % (ref 11.5–15.5)
WBC: 18.3 10*3/uL — ABNORMAL HIGH (ref 4.0–10.5)
nRBC: 0 % (ref 0.0–0.2)

## 2022-07-14 MED ORDER — SENNOSIDES-DOCUSATE SODIUM 8.6-50 MG PO TABS
2.0000 | ORAL_TABLET | Freq: Every day | ORAL | Status: DC
  Administered 2022-07-14: 2 via ORAL
  Filled 2022-07-14: qty 2

## 2022-07-14 MED ORDER — BISACODYL 10 MG RE SUPP
10.0000 mg | Freq: Every day | RECTAL | Status: DC
Start: 2022-07-14 — End: 2022-07-14

## 2022-07-14 MED ORDER — BISACODYL 5 MG PO TBEC
10.0000 mg | DELAYED_RELEASE_TABLET | Freq: Every day | ORAL | Status: DC
  Administered 2022-07-14 – 2022-07-15 (×2): 10 mg via ORAL
  Filled 2022-07-14 (×2): qty 2

## 2022-07-14 NOTE — Progress Notes (Signed)
PROGRESS NOTE    Brandon Mendoza  ZOX:[id number] DOB: [birth year] DOA: 07/08/2022 PCP: Lula Olszewski, MD   Brief Narrative: 82 year old male with history of spinal stenosis, peripheral neuropathy, remote stroke with left carotid endarterectomy, obstructive sleep apnea on CPAP, hypertension, COPD, paroxysmal atrial fibrillation, frequent falls admitted with right quadriceps tendon tear after a fall. Mri-shows full thickness quad tear with tendon retraction of 2.4 cm and mild to mod semimembranosus-tibial collateral ligament bursitis   Assessment & Plan:   Principal Problem:   Recurrent falls Active Problems:   Mixed hyperlipidemia   Essential hypertension   Leukocytosis   Weakness generalized   Dehydration   Depression   COPD (chronic obstructive pulmonary disease)   Quadriceps tendon rupture, right, initial encounter   #1 right quadriceps tendon tear status post repair 07/12/2022 Dr. Charlann Boxer Seen by physical therapy Patient feels the need to go to SNF with history of recurrent falls and stroke and recent surgery DVT prophylaxis with aspirin per Ortho Weightbearing as tolerated Need to be in extension at all times and not to push the right leg to get out of chair or bed  #2 orthostatic hypotension with recurrent falls resolved continue to hold HCTZ on discharge Blood pressure still on the soft side to normal  #3 history of atrial fibrillation-continue flecainide Not on anticoagulation since Zio patch never had more than 2 to 3 hours of A-fib per cardiology Dr. Jacinto Halim  #4 history of PVD and stroke on aspirin and statin  #5 leukocytosis resolved without antibiotics  #6 AKI resolved  #7 hypertension bp trending up  Increase Cozaar to 50 mg daily   Estimated body mass index is 28.65 kg/m as calculated from the following:   Height as of this encounter:  (1.753 m).   Weight as of this encounter: 88 kg.  DVT prophylaxis: Aspirin Code Status: Full code Family  Communication: None at bedside Disposition Plan:  Status is: Inpatient Remains inpatient appropriate because: For SNF   Consultants: Ortho  Procedures: Right quadriceps tendon repair Antimicrobials: None  Subjective: C/o lot more pain than yesterday to right leg  Objective: Vitals:   07/13/22 1704 07/13/22 1935 07/13/22 2023 07/14/22 0609  BP: 133/75  (!) 157/74 (!) 166/67  Pulse:  76 74 (!) 59  Resp: Temp: 97.8 F (36.6 C)  97.8 F (36.6 C) 97.6 F (36.4 C)  TempSrc: Oral  Oral Oral  SpO2: 98% 96% 93% 96%  Weight:      Height:        Intake/Output Summary (Last 24 hours) at 07/14/2022 1219 Last data filed at 07/14/2022 1120 Gross per 24 hour  Intake 2007.3 ml  Output 2300 ml  Net -292.7 ml    Filed Weights   07/08/22 1604 07/12/22 1247  Weight: 88 kg 88 kg    Examination:  General exam: Appears  in NAD  Respiratory system: Clear to auscultation. Respiratory effort normal. Cardiovascular system: S1 & S2 heard, RRR. No JVD, murmurs, rubs, gallops or clicks. No pedal edema. Gastrointestinal system: Abdomen is nondistended, soft and nontender. No organomegaly or masses felt. Normal bowel sounds heard. Central nervous system: Alert and oriented. No focal neurological deficits. Extremities: Right lower ext covered in Ace wrap Skin: No rashes, lesions or ulcers Psychiatry: Judgement and insight appear normal. Mood & affect appropriate.     Data Reviewed: I have personally reviewed following labs and imaging studies  CBC: Recent Labs  Lab 07/08/22 1706 07/08/22 2045 07/09/22 0443  07/10/22 0512 07/12/22 0451 07/13/22 0440 07/14/22 0455  WBC 17.6*   < > 11.1* 10.1 11.5* 8.7 18.3*  NEUTROABS 13.2*  --   --   --   --   --   --   HGB 13.5   < > 12.5* 12.0* 11.8* 10.7* 11.5*  HCT 39.3   < > 36.7* 35.4* 35.1* 31.7* 34.1*  MCV 90.8   < > 92.0 92.9 92.1 92.4 91.2  PLT 330   < > 291 290 265 248 285   < > = values in this interval not displayed.     Basic Metabolic Panel: Recent Labs  Lab 07/09/22 0443 07/10/22 0512 07/11/22 0454 07/12/22 0451 07/13/22 0440  NA 134* 138 136 136 137  K 3.6 3.9 4.4 4.0 3.8  CL 105 108 105 105 109  CO2 24 24 24 25  21*  GLUCOSE 140* 108* 106* 105* 159*  BUN 36* 35* 36* 31* 28*  CREATININE 1.36* 1.07 1.10 1.09 0.97  CALCIUM 8.3* 8.5* 8.5* 8.6* 7.5*  MG 2.4  --   --   --   --   PHOS 3.7  --   --   --   --     GFR: Estimated Creatinine Clearance: 63.3 mL/min (by C-G formula based on SCr of 0.97 mg/dL). Liver Function Tests: Recent Labs  Lab 07/08/22 1706 07/09/22 0443  AST 20 17  ALT 20 15  ALKPHOS 74 68  BILITOT 0.7 0.6  PROT 7.0 6.2*  ALBUMIN 3.9 3.4*    No results for input(s): "LIPASE", "AMYLASE" in the last 168 hours. No results for input(s): "AMMONIA" in the last 168 hours. Coagulation Profile: No results for input(s): "INR", "PROTIME" in the last 168 hours. Cardiac Enzymes: Recent Labs  Lab 07/08/22 1706  CKTOTAL 139    BNP (last 3 results) No results for input(s): "PROBNP" in the last 8760 hours. HbA1C: No results for input(s): "HGBA1C" in the last 72 hours. CBG: No results for input(s): "GLUCAP" in the last 168 hours. Lipid Profile: No results for input(s): "CHOL", "HDL", "LDLCALC", "TRIG", "CHOLHDL", "LDLDIRECT" in the last 72 hours. Thyroid Function Tests: No results for input(s): "TSH", "T4TOTAL", "FREET4", "T3FREE", "THYROIDAB" in the last 72 hours. Anemia Panel: No results for input(s): "VITAMINB12", "FOLATE", "FERRITIN", "TIBC", "IRON", "RETICCTPCT" in the last 72 hours. Sepsis Labs: No results for input(s): "PROCALCITON", "LATICACIDVEN" in the last 168 hours.  Recent Results (from the past 240 hour(s))  Surgical pcr screen     Status: None   Collection Time: 07/12/22 12:59 PM   Specimen: Nasal Mucosa; Nasal Swab  Result Value Ref Range Status   MRSA, PCR NEGATIVE NEGATIVE Final   Staphylococcus aureus NEGATIVE NEGATIVE Final    Comment:  (NOTE) The Xpert SA Assay (FDA approved for NASAL specimens in patients 49 years of age and older), is one component of a comprehensive surveillance program. It is not intended to diagnose infection nor to guide or monitor treatment. Performed at [facility], [address] [place], [place] [address]          Radiology Studies: No results found.      Scheduled Meds:  acetaminophen  1,000 mg Oral Q6H   aspirin  81 mg Oral BID   bisacodyl  10 mg Oral Daily   docusate sodium  100 mg Oral BID   DULoxetine  30 mg Oral QHS   flecainide  100 mg Oral BID   losartan  25 mg Oral Daily   polyethylene  glycol  17 g Oral BID   rosuvastatin  10 mg Oral QHS   senna-docusate  2 tablet Oral QHS   sertraline  50 mg Oral Daily   Continuous Infusions:  sodium chloride Stopped (07/13/22 1439)   methocarbamol (ROBAXIN) IV       LOS: 6 days   Time spent:38 min  Alwyn Ren, MD 07/14/2022, 12:19 PM

## 2022-07-14 NOTE — Progress Notes (Signed)
Physical Therapy Treatment Patient Details Name: Brandon Mendoza MRN: [id number] DOB: 1940/09/20 Today's Date: 07/14/2022   History of Present Illness Patient is a 82 year old male who presented with decreased ability to walk with h/o fall on 4/9. Patient on 4/9 was diagnosed with ligamentous injury of R knee and placed in KI with patient declining therapy and transitioning home. ortho consulted, MRI ordered and confirmed Full-thickness tear of the distal quadriceps insertion on the  patella with up to 2.4 cm tendon retraction. Decreased signal likely subacute to older blood products within  the suprapatellar joint fluid, extending through the distal  quadriceps.  Currently, patient is reporting being unable to take care of himself at this level with four more additional falls since 4/9. 4/16 s/p repair R quadricep tendon.  PMH: anxiety, kyphosis, ischemic stroke, HTN, COPD. L4-5 PLIF 9/22    PT Comments    The patient  reports having significant pain earlier, is now more controlled. Patient ambulated x 40' with Rw and close  min assistance  and chair followed with patient reports L knee subject to buckle. Continue PT for  mobility.   Recommendations for follow up therapy are one component of a multi-disciplinary discharge planning process, led by the attending physician.  Recommendations may be updated based on patient status, additional functional criteria and insurance authorization.  Follow Up Recommendations  Can patient physically be transported by private vehicle: Yes    Assistance Recommended at Discharge Frequent or constant Supervision/Assistance  Patient can return home with the following A little help with walking and/or transfers;A little help with bathing/dressing/bathroom;Assistance with cooking/housework;Assist for transportation;Help with stairs or ramp for entrance   Equipment Recommendations  None recommended by PT    Recommendations for Other Services       Precautions  / Restrictions Precautions Precautions: Fall;Knee Precaution Comments: fall risk, reports left knee buckles Required Braces or Orthoses: Other Brace Knee Immobilizer - Right: On at all times Other Brace: BLEDSOE on at all times, locked in extension Restrictions RLE Weight Bearing: Weight bearing as tolerated Other Position/Activity Restrictions: "Knee in extension at all times. Do not use the right leg to push up out of a chair." per PA note 4/17     Mobility  Bed Mobility   Bed Mobility: Supine to Sit     Supine to sit: Min assist     General bed mobility comments: support  right leg to decreased quad activation    Transfers Overall transfer level: Needs assistance Equipment used: Rolling walker (2 wheels) Transfers: Sit to/from Stand Sit to Stand: Mod assist, +2 safety/equipment, From elevated surface           General transfer comment: mod A to power up to stand  at RW, Push up with UE's maximally. Steady assist to gain balance, support right  leg as patient descended to recliner    Ambulation/Gait Ambulation/Gait assistance: Min assist, +2 safety/equipment Gait Distance (Feet): 40 Feet Assistive device: Rolling walker (2 wheels) Gait Pattern/deviations: Step-to pattern, Antalgic Gait velocity: decreased     General Gait Details: cues to take small steps and use UE's   due to L knee buckles   Stairs             Wheelchair Mobility    Modified Rankin (Stroke Patients Only)       Balance Overall balance assessment: Needs assistance Sitting-balance support: Feet supported Sitting balance-Leahy Scale: Fair       Standing balance-Leahy Scale: Poor  Cognition Arousal/Alertness: Awake/alert Behavior During Therapy: WFL for tasks assessed/performed Overall Cognitive Status: Within Functional Limits for tasks assessed                                          Exercises      General  Comments        Pertinent Vitals/Pain Pain Assessment Pain Score: 3  Pain Location: R knee with activity Pain Descriptors / Indicators: Discomfort, Grimacing Pain Intervention(s): Monitored during session, Premedicated before session, Ice applied    Home Living                          Prior Function            PT Goals (current goals can now be found in the care plan section) Progress towards PT goals: Progressing toward goals    Frequency    Min 1X/week      PT Plan Current plan remains appropriate    Co-evaluation              AM-PAC PT "6 Clicks" Mobility   Outcome Measure  Help needed turning from your back to your side while in a flat bed without using bedrails?: A Little Help needed moving from lying on your back to sitting on the side of a flat bed without using bedrails?: A Little Help needed moving to and from a bed to a chair (including a wheelchair)?: A Little Help needed standing up from a chair using your arms (e.g., wheelchair or bedside chair)?: A Lot Help needed to walk in hospital room?: A Lot Help needed climbing 3-5 steps with a railing? : Total 6 Click Score: 14    End of Session Equipment Utilized During Treatment: Gait belt (Bledsoe) Activity Tolerance: Patient tolerated treatment well Patient left: in chair;with call bell/phone within reach;with chair alarm set Nurse Communication: Mobility status PT Visit Diagnosis: Unsteadiness on feet (R26.81);Muscle weakness (generalized) (M62.81);History of falling (Z91.81);Difficulty in walking, not elsewhere classified (R26.2)     Time: 1410-1441 PT Time Calculation (min) (ACUTE ONLY): 31 min  Charges:  $Gait Training: 23-37 mins                     Blanchard Kelch PT Acute Rehabilitation Services Office [phone] Weekend [phone]    Rada Hay 07/14/2022, 4:21 PM

## 2022-07-14 NOTE — TOC Progression Note (Addendum)
Transition of Care [facility]) - Progression Note   Patient Details  Name: EMORY GALLENTINE MRN: [id number] Date of Birth: 06-Mar-1941  Transition of [facility]) CM/SW Contact  Ewing Schlein, LCSW Phone Number: 07/14/2022, 1:16 PM  Clinical Narrative: CSW met with patient to review bed offers. [facility]. CSW confirmed bed with [facility] in admissions and it will be available tomorrow pending patient being medically stable for discharge. CSW updated hospitalist.  Addendum: Patient has completed admission paperwork for [facility], which was faxed back to the facility [phone]).  Expected Discharge Plan: Skilled Nursing Facility (vs. home with home health) Barriers to Discharge: Continued Medical Work up  Expected Discharge Plan and Services In-house Referral: Clinical Social Work Living arrangements for the past 2 months: Single Family Home            DME Arranged: N/A DME Agency: NA  Social Determinants of Health (SDOH) Interventions SDOH Screenings   Food Insecurity: No Food Insecurity (07/13/2022)  Housing: Low Risk  (07/13/2022)  Transportation Needs: No Transportation Needs (07/13/2022)  Utilities: Not At Risk (07/13/2022)  Depression (PHQ2-9): Low Risk  (12/10/2021)  Tobacco Use: Medium Risk (07/13/2022)   Readmission Risk Interventions    07/09/2022    1:44 PM  Readmission Risk Prevention Plan  Transportation Screening Complete  PCP or Specialist Appt within 5-7 Days Complete  Home Care Screening Complete  Medication Review (RN CM) Complete

## 2022-07-14 NOTE — Progress Notes (Signed)
Subjective: 2 Days Post-Op Procedure(s) (LRB): REPAIR QUADRICEP TENDON (Right) Patient reports pain as moderate.   Patient seen in rounds for Dr. Charlann Boxer. Patient is well, and has had no acute complaints or problems. No acute events overnight. Voiding without difficulty. Comments that he has not had a BM yet.  We will continue therapy today.   Objective: Vital signs in last 24 hours: Temp:  [97.6 F (36.4 C)-97.8 F (36.6 C)] 97.6 F (36.4 C) (04/18 0609) Pulse Rate:  [59-90] 59 (04/18 0609) Resp:  [15-18] 18 (04/18 0609) BP: (120-166)/(51-75) 166/67 (04/18 0609) SpO2:  [93 %-98 %] 96 % (04/18 0609) FiO2 (%):  [21 %] 21 % (04/17 1935)  Intake/Output from previous day:  Intake/Output Summary (Last 24 hours) at 07/14/2022 1009 Last data filed at 07/14/2022 0900 Gross per 24 hour  Intake 1647.3 ml  Output 2100 ml  Net -452.7 ml     Intake/Output this shift: Total I/O In: 447.3 [I.V.:447.3] Out: -   Labs: Recent Labs    07/12/22 0451 07/13/22 0440 07/14/22 0455  HGB 11.8* 10.7* 11.5*   Recent Labs    07/13/22 0440 07/14/22 0455  WBC 8.7 18.3*  RBC 3.43* 3.74*  HCT 31.7* 34.1*  PLT 248 285   Recent Labs    07/12/22 0451 07/13/22 0440  NA 136 137  K 4.0 3.8  CL 105 109  CO2 25 21*  BUN 31* 28*  CREATININE 1.09 0.97  GLUCOSE 105* 159*  CALCIUM 8.6* 7.5*   No results for input(s): "LABPT", "INR" in the last 72 hours.  Exam: General - Patient is Alert and Oriented Extremity - Neurologically intact Sensation intact distally Intact pulses distally Dorsiflexion/Plantar flexion intact Dressing - dressing C/D/I Motor Function - intact, moving foot and toes well on exam.   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   The spinal cord appears normal. 2.   No spinal stenosis. 3.   Mild multilevel degenerative changes as detailed above that do not lead to spinal stenosis or nerve root compression. 4.   T2 hyperintense foci within the pons consistent with chronic microvascular ischemic changes.   Loop - [employer] Linq 10/22/2020 10/22/2020   Nocturia    Peripheral vascular disease    Sleep apnea    on 6 cm, nasal pillow   Stroke 05/01/2011   ischemic   Weakness generalized 10/21/2021   Severe, legs and arms    Assessment/Plan: 2 Days Post-Op Procedure(s) (LRB): REPAIR QUADRICEP TENDON (Right) Principal Problem:   Recurrent falls Active Problems:   Mixed hyperlipidemia   Essential hypertension   Leukocytosis   Weakness generalized   Dehydration   Depression   COPD (chronic obstructive pulmonary disease)   Quadriceps tendon rupture, right, initial encounter  Estimated body mass index  is 28.65 kg/m as calculated from the following:   Height as of this encounter:  (1.753 m).   Weight as of this encounter: 88 kg. Advance diet Up with therapy   DVT Prophylaxis - Aspirin Weight bearing as tolerated. Bledsoe/TROM to be fitted for patient before leaving Knee in extension at all times.  Do not use the right leg to push up out of a chair.   ACE wrap removed today.   Patient wishes to go to SNF at discharge based on limited help at home.   Follow up in our office in 2 weeks.  Ready for discharge from orthopaedic standpoint whenever medically ready with bed available.    Dennie Bible, PA-C Orthopedic Surgery [phone] 07/14/2022, 10:09 AM

## 2022-07-14 NOTE — Care Management Important Message (Signed)
Important Message  Patient Details IM Letter given. Name: Brandon Mendoza MRN: [id number] Date of Birth: [birth year]   Medicare Important Message Given:  Yes     [name] 07/14/2022, 9:22 AM

## 2022-07-15 DIAGNOSIS — R296 Repeated falls: Secondary | ICD-10-CM | POA: Diagnosis not present

## 2022-07-15 MED ORDER — BISACODYL 5 MG PO TBEC: 10.0000 mg | DELAYED_RELEASE_TABLET | Freq: Every day | ORAL | 0 refills | Status: DC

## 2022-07-15 MED ORDER — BISACODYL 10 MG RE SUPP
10.0000 mg | Freq: Once | RECTAL | Status: AC
  Administered 2022-07-15: 10 mg via RECTAL
  Filled 2022-07-15: qty 1

## 2022-07-15 MED ORDER — SENNOSIDES-DOCUSATE SODIUM 8.6-50 MG PO TABS: 2.0000 | ORAL_TABLET | Freq: Every day | ORAL | Status: DC

## 2022-07-15 MED ORDER — DOCUSATE SODIUM 100 MG PO CAPS: 100.0000 mg | ORAL_CAPSULE | Freq: Two times a day (BID) | ORAL | 0 refills | Status: DC

## 2022-07-15 MED ORDER — ONDANSETRON HCL 4 MG PO TABS: 4.0000 mg | ORAL_TABLET | Freq: Four times a day (QID) | ORAL | 0 refills | Status: DC | PRN

## 2022-07-15 MED ORDER — LOSARTAN POTASSIUM 25 MG PO TABS: 25.0000 mg | ORAL_TABLET | Freq: Every day | ORAL | 2 refills | Status: DC

## 2022-07-15 NOTE — TOC Transition Note (Signed)
Transition of Care [facility]) - CM/SW Discharge Note  Patient Details  Name: Brandon Mendoza MRN: [id number] Date of Birth: 1940-07-20  Transition of Care [facility]) CM/SW Contact:  Ewing Schlein, LCSW Phone Number: 07/15/2022, 10:26 AM  Clinical Narrative: Patient is medically stable to [facility] to [facility]. Patient will go to room 509 and the number for report is [phone]. Discharge summary, discharge orders, and SNF transfer report faxed to facility in hub. Medical necessity form done; PTAR scheduled. Discharge packet completed. RN updated. TOC signing off.  Final next level of care: Skilled Nursing Facility Barriers to Discharge: Barriers Resolved  Patient Goals and CMS Choice CMS Medicare.gov Compare Post Acute Care list provided to:: Patient Choice offered to / list presented to : Patient  Discharge Placement     [facility] Patient to be transferred to facility by: PTAR Patient and family notified of of transfer: 07/15/22  Discharge Plan and Services Additional resources added to the After Visit Summary for   In-house Referral: Clinical Social Work        DME Arranged: N/A DME Agency: NA  Social Determinants of Health (SDOH) Interventions SDOH Screenings   Food Insecurity: No Food Insecurity (07/13/2022)  Housing: Low Risk  (07/13/2022)  Transportation Needs: No Transportation Needs (07/13/2022)  Utilities: Not At Risk (07/13/2022)  Depression (PHQ2-9): Low Risk  (12/10/2021)  Tobacco Use: Medium Risk (07/13/2022)   Readmission Risk Interventions    07/09/2022    1:44 PM  Readmission Risk Prevention Plan  Transportation Screening Complete  PCP or Specialist Appt within 5-7 Days Complete  Home Care Screening Complete  Medication Review (RN CM) Complete

## 2022-07-15 NOTE — Discharge Summary (Signed)
Physician Discharge Summary  Brandon Mendoza ZOX:[id number] DOB: 27-Nov-1940 DOA: 07/08/2022  PCP: Lula Olszewski, MD  Admit date: 07/08/2022 Discharge date: 07/15/2022  Admitted From: home Disposition: snf Recommendations for Outpatient Follow-up:  Follow up with PCP in 1-2 weeks Please obtain BMP/CBC in one week Please follow up  with DR [facility] Health:none Equipment/Devices:none  Discharge Condition:stable CODE STATUS:full Diet recommendation: cardiac Brief/Interim Summary:  82 year old male with history of spinal stenosis, peripheral neuropathy, remote stroke with left carotid endarterectomy, obstructive sleep apnea on CPAP, hypertension, COPD, paroxysmal atrial fibrillation, frequent falls admitted with right quadriceps tendon tear after a fall. Mri-showed full thickness quad tear with tendon retraction of 2.4 cm and mild to mod semimembranosus-tibial collateral ligament bursitis   Discharge Diagnoses:  Principal Problem:   Recurrent falls Active Problems:   Mixed hyperlipidemia   Essential hypertension   Leukocytosis   Weakness generalized   Dehydration   Depression   COPD (chronic obstructive pulmonary disease)   Quadriceps tendon rupture, right, initial encounter     #1 right quadriceps tendon tear status post repair 07/12/2022 Dr. Charlann Boxer Seen by physical therapy  Rec SNF due to falls history of stroke history of surgery unable to care for himself at home.  DVT prophylaxis with aspirin twice a day.  He is weightbearing as tolerated.  However he needs to be in extension at all times and not to push the right leg to get out of chair or bed.  #2 orthostatic hypotension with recurrent falls resolved He was on hydrochlorothiazide and an ACE inhibitor prior to admission.  Hydrochlorothiazide was on hold during the hospital stay.  He will be discharged home only on Cozaar.   #3 history of atrial fibrillation-continue flecainide Not on anticoagulation since Zio patch never had  more than 2 to 3 hours of A-fib per cardiology Dr. Jacinto Halim   #4 history of PVD and stroke on aspirin and statin   #5 leukocytosis resolved without antibiotics   #6 AKI resolved   #7 hypertension continue Cozaar  Estimated body mass index is 28.65 kg/m as calculated from the following:   Height as of this encounter:  (1.753 m).   Weight as of this encounter: 88 kg.  Discharge Instructions  Discharge Instructions     Call MD for:  temperature >100.4   Complete by: As directed    Diet - low sodium heart healthy   Complete by: As directed    Increase activity slowly   Complete by: As directed       Allergies as of 07/15/2022       Reactions   Sulfamethoxazole-trimethoprim Nausea And Vomiting   Pt unaware of this intolerance        Medication List     STOP taking these medications    ALPRAZolam 0.25 MG tablet Commonly known as: XANAX   ibuprofen 800 MG tablet Commonly known as: ADVIL   meclizine 12.5 MG tablet Commonly known as: ANTIVERT   meloxicam 15 MG tablet Commonly known as: MOBIC   oxyCODONE-acetaminophen 5-325 MG tablet Commonly known as: Percocet       TAKE these medications    albuterol 108 (90 Base) MCG/ACT inhaler Commonly known as: VENTOLIN HFA Inhale 1-2 puffs into the lungs every 6 (six) hours as needed for wheezing or shortness of breath.   aspirin 81 MG chewable tablet Chew 1 tablet (81 mg total) by mouth 2 (two) times daily for 28 days. What changed: when to take this   bisacodyl 5  MG EC tablet Commonly known as: DULCOLAX Take 2 tablets (10 mg total) by mouth daily. Start taking on: July 16, 2022   docusate sodium 100 MG capsule Commonly known as: COLACE Take 1 capsule (100 mg total) by mouth 2 (two) times daily.   DULoxetine 30 MG capsule Commonly known as: CYMBALTA Take 30 mg by mouth daily.   flecainide 100 MG tablet Commonly known as: TAMBOCOR Take 1 tablet (100 mg total) by mouth 2 (two) times daily.    losartan-hydrochlorothiazide 50-12.5 MG tablet Commonly known as: HYZAAR Take 1 tablet by mouth every morning.   methocarbamol 500 MG tablet Commonly known as: ROBAXIN Take 1 tablet (500 mg total) by mouth every 6 (six) hours as needed for muscle spasms.   MULTIPLE VITAMIN PO Take 1 tablet by mouth daily.   ondansetron 4 MG tablet Commonly known as: ZOFRAN Take 1 tablet (4 mg total) by mouth every 6 (six) hours as needed for nausea.   oxyCODONE 5 MG immediate release tablet Commonly known as: Oxy IR/ROXICODONE Take 1 tablet (5 mg total) by mouth every 4 (four) hours as needed for severe pain.   polyethylene glycol 17 g packet Commonly known as: MIRALAX / GLYCOLAX Take 17 g by mouth 2 (two) times daily.   rosuvastatin 20 MG tablet Commonly known as: CRESTOR Take 0.5 tablets (10 mg total) by mouth at bedtime. What changed: how much to take   senna-docusate 8.6-50 MG tablet Commonly known as: Senokot-S Take 2 tablets by mouth at bedtime.   sertraline 50 MG tablet Commonly known as: ZOLOFT Take 1 tablet (50 mg total) by mouth daily. Use to taper off sertraline slowly over several months, if tolerated. Be aware anxiety and irritability may increase and you might have to stay on this        Contact information for follow-up providers     Durene Romans, MD. Schedule an appointment as soon as possible for a visit in 2 week(s).   Specialty: Orthopedic Surgery Contact information: [address] [place] [place] [address] [phone]         Lula Olszewski, MD Follow up.   Specialty: Internal Medicine Contact information: [address] [place] [address] [phone]              Contact information for after-discharge care     Destination     [facility] .   Service: Skilled Nursing Contact information: [address] [facility] [place] [address] [phone]                    Allergies  Allergen  Reactions   Sulfamethoxazole-Trimethoprim Nausea And Vomiting    Pt unaware of this intolerance    Consultations: Dr Charlann Boxer   Procedures/Studies: MR KNEE RIGHT WO CONTRAST  Result Date: 07/11/2022 CLINICAL DATA:  Knee trauma. Internal derangement suspected. Recurrent falls. Steroid injection to right knee 07/04/2022. Swelling. Tenderness along with numerous fading bruises. EXAM: MRI OF THE RIGHT KNEE WITHOUT CONTRAST TECHNIQUE: Multiplanar, multisequence MR imaging of the knee was performed. No intravenous contrast was administered. COMPARISON:  Right knee radiographs 07/05/2022 FINDINGS: MENISCI Medial meniscus: No discrete tear is seen extending through an articular surface of the medial meniscus. There is minimal degenerative fraying/blunting of the free edge of the posterior horn of the medial meniscus (sagittal series 12, images 23 and 24). Lateral meniscus:  Intact. LIGAMENTS Cruciates: The ACL and PCL are intact. Collaterals: The medial collateral ligament is intact. The fibular  collateral ligament, biceps femoris tendon, iliotibial band, and popliteus tendon are intact. CARTILAGE Patellofemoral: Mild lateral patellar facet partial-thickness cartilage loss. Medial: Moderate weight-bearing medial femoral condyle and medial aspect of the medial tibial plateau cartilage thinning. Lateral: Moderate mid weight-bearing lateral femoral condyle cartilage thinning. Joint: Tinyjoint effusion. There is also mild decreased T1 and decreased T2 signal material within the suprapatellar joint fluid suggesting blood products. This extends through the full-thickness quadriceps tendon tear (sagittal series 13, image 17). Mild edema within the posterolateral aspect of Hoffa's fat pad at mid height. Popliteal Fossa: Mild to moderate intermediate T2 signal and thickening tendinosis of the semimembranosus tendon just proximal to its intersection with the medial head of gastrocnemius tendon at the semimembranosus/medial  collateral ligament bursa (axial series 8 images 14 through 19). No Baker's cyst. Extensor Mechanism: There is a full-thickness tear of the distal quadriceps insertion on the patella with up to approximately 2.4 cm tendon retraction. Mild edema and swelling within the anterior superior knee subcutaneous fat. Bones:  No acute fracture or dislocation. Other: There is mild-to-moderate fluid within the semimembranosus-tibial collateral ligament bursa (sagittal series 13, image 28, axial series 8, image 22, coronal series 11 images 8 through 10). IMPRESSION: 1. Full-thickness tear of the distal quadriceps insertion on the patella with up to 2.4 cm tendon retraction. Decreased T1 and decreased T2 signal likely subacute to older blood products within the suprapatellar joint fluid, extending through the distal quadriceps full-thickness tendon tear. 2. Mild-to-moderate semimembranosus-tibial collateral ligament bursitis. 3. Mild-to-moderate medial compartment and mild patellofemoral compartment cartilage degenerative changes. 4. Minimal degenerative fraying/blunting of the free edge of the posterior horn of the medial meniscus. No discrete tear is seen extending through an articular surface of the medial meniscus. Electronically Signed   By: Neita Garnet M.D.   On: 07/11/2022 08:38   DG Foot Complete Right  Result Date: 07/08/2022 Please see detailed radiograph report in office note.  CT HEAD WO CONTRAST ( )  Result Date: 07/05/2022 CLINICAL DATA:  Status post fall. EXAM: CT HEAD WITHOUT CONTRAST TECHNIQUE: Contiguous axial images were obtained from the base of the skull through the vertex without intravenous contrast. RADIATION DOSE REDUCTION: This exam was performed according to the departmental dose-optimization program which includes automated exposure control, adjustment of the mA and/or kV according to patient size and/or use of iterative reconstruction technique. COMPARISON:  November 29, 2011 FINDINGS:  Brain: There is mild cerebral atrophy with widening of the extra-axial spaces and ventricular dilatation. There are areas of decreased attenuation within the white matter tracts of the supratentorial brain, consistent with microvascular disease changes. Small, chronic bilateral basal ganglia lacunar infarcts are seen. Vascular: No hyperdense vessel or unexpected calcification. Skull: Normal. Negative for fracture or focal lesion. Sinuses/Orbits: No acute finding. Other: None. IMPRESSION: 1. No acute intracranial abnormality. 2. Generalized cerebral atrophy and microvascular disease changes of the supratentorial brain. 3. Small, chronic bilateral basal ganglia lacunar infarcts. Electronically Signed   By: Aram Candela M.D.   On: 07/05/2022 19:26   DG Knee 2 Views Right  Result Date: 07/05/2022 CLINICAL DATA:  Trauma, fall EXAM: RIGHT KNEE - 1-2 VIEW COMPARISON:  None Available. FINDINGS: No recent fracture or dislocation is seen. There is soft tissue fullness in suprapatellar bursa suggesting moderate to large effusion. Fairly extensive arterial calcifications are seen in soft tissues. Small faint calcification is noted anterior to the distal shaft of femur, possibly residual finding from previous soft tissue injury. IMPRESSION: No recent fracture or dislocation is seen in  right knee. There is soft tissue fullness in suprapatellar bursa suggesting moderate to large effusion. Arteriosclerosis. Electronically Signed   By: Ernie Avena M.D.   On: 07/05/2022 17:01   (Echo, Carotid, EGD, Colonoscopy, ERCP)    Subjective:  Had a BM after dulcolax supp Discharge Exam: Vitals:   07/14/22 1950 07/15/22 0504  BP: (!) 141/55 (!) 161/68  Pulse: 72 75  Resp: 16 16  Temp: 98 F (36.7 C) 97.6 F (36.4 C)  SpO2: 94% 97%   Vitals:   07/14/22 0609 07/14/22 1307 07/14/22 1950 07/15/22 0504  BP: (!) 166/67 (!) 149/59 (!) 141/55 (!) 161/68  Pulse: (!) 59 62 72 75  Resp: 18 18 16 16   Temp: 97.6 F  (36.4 C) (!) 97.4 F (36.3 C) 98 F (36.7 C) 97.6 F (36.4 C)  TempSrc: Oral Oral Oral Oral  SpO2: 96% 95% 94% 97%  Weight:      Height:        General: Pt is alert, awake, not in acute distress Cardiovascular: RRR, S1/S2 +, no rubs, no gallops Respiratory: CTA bilaterally, no wheezing, no rhonchi Abdominal: Soft, NT, ND, bowel sounds + Extremities: rle trace edema    The results of significant diagnostics from this hospitalization (including imaging, microbiology, ancillary and laboratory) are listed below for reference.     Microbiology: Recent Results (from the past 240 hour(s))  Surgical pcr screen     Status: None   Collection Time: 07/12/22 12:59 PM   Specimen: Nasal Mucosa; Nasal Swab  Result Value Ref Range Status   MRSA, PCR NEGATIVE NEGATIVE Final   Staphylococcus aureus NEGATIVE NEGATIVE Final    Comment: (NOTE) The Xpert SA Assay (FDA approved for NASAL specimens in patients 62 years of age and older), is one component of a comprehensive surveillance program. It is not intended to diagnose infection nor to guide or monitor treatment. Performed at [facility], [address], [place], [place] [address]      Labs: BNP (last 3 results) No results for input(s): "BNP" in the last 8760 hours. Basic Metabolic Panel: Recent Labs  Lab 07/09/22 0443 07/10/22 0512 07/11/22 0454 07/12/22 0451 07/13/22 0440  NA 134* 138 136 136 137  K 3.6 3.9 4.4 4.0 3.8  CL 105 108 105 105 109  CO2 24 24 24 25  21*  GLUCOSE 140* 108* 106* 105* 159*  BUN 36* 35* 36* 31* 28*  CREATININE 1.36* 1.07 1.10 1.09 0.97  CALCIUM 8.3* 8.5* 8.5* 8.6* 7.5*  MG 2.4  --   --   --   --   PHOS 3.7  --   --   --   --    Liver Function Tests: Recent Labs  Lab 07/08/22 1706 07/09/22 0443  AST 20 17  ALT 20 15  ALKPHOS 74 68  BILITOT 0.7 0.6  PROT 7.0 6.2*  ALBUMIN 3.9 3.4*   No results for input(s): "LIPASE", "AMYLASE" in the last 168 hours. No results for  input(s): "AMMONIA" in the last 168 hours. CBC: Recent Labs  Lab 07/08/22 1706 07/08/22 2045 07/09/22 0443 07/10/22 0512 07/12/22 0451 07/13/22 0440 07/14/22 0455  WBC 17.6*   < > 11.1* 10.1 11.5* 8.7 18.3*  NEUTROABS 13.2*  --   --   --   --   --   --   HGB 13.5   < > 12.5* 12.0* 11.8* 10.7* 11.5*  HCT 39.3   < > 36.7* 35.4* 35.1* 31.7* 34.1*  MCV 90.8   < >  92.0 92.9 92.1 92.4 91.2  PLT 330   < > 291 290 265 248 285   < > = values in this interval not displayed.   Cardiac Enzymes: Recent Labs  Lab 07/08/22 1706  CKTOTAL 139   BNP: Invalid input(s): "POCBNP" CBG: No results for input(s): "GLUCAP" in the last 168 hours. D-Dimer No results for input(s): "DDIMER" in the last 72 hours. Hgb A1c No results for input(s): "HGBA1C" in the last 72 hours. Lipid Profile No results for input(s): "CHOL", "HDL", "LDLCALC", "TRIG", "CHOLHDL", "LDLDIRECT" in the last 72 hours. Thyroid function studies No results for input(s): "TSH", "T4TOTAL", "T3FREE", "THYROIDAB" in the last 72 hours.  Invalid input(s): "FREET3" Anemia work up No results for input(s): "VITAMINB12", "FOLATE", "FERRITIN", "TIBC", "IRON", "RETICCTPCT" in the last 72 hours. Urinalysis    Component Value Date/Time   COLORURINE YELLOW 07/08/2022 1814   APPEARANCEUR HAZY (A) 07/08/2022 1814   LABSPEC 1.017 07/08/2022 1814   PHURINE 5.0 07/08/2022 1814   GLUCOSEU NEGATIVE 07/08/2022 1814   GLUCOSEU NEGATIVE 03/01/2022 1101   HGBUR MODERATE (A) 07/08/2022 1814   BILIRUBINUR NEGATIVE 07/08/2022 1814   KETONESUR NEGATIVE 07/08/2022 1814   PROTEINUR NEGATIVE 07/08/2022 1814   UROBILINOGEN 1.0 03/01/2022 1101   NITRITE NEGATIVE 07/08/2022 1814   LEUKOCYTESUR NEGATIVE 07/08/2022 1814   Sepsis Labs Recent Labs  Lab 07/10/22 0512 07/12/22 0451 07/13/22 0440 07/14/22 0455  WBC 10.1 11.5* 8.7 18.3*   Microbiology Recent Results (from the past 240 hour(s))  Surgical pcr screen     Status: None   Collection Time:  07/12/22 12:59 PM   Specimen: Nasal Mucosa; Nasal Swab  Result Value Ref Range Status   MRSA, PCR NEGATIVE NEGATIVE Final   Staphylococcus aureus NEGATIVE NEGATIVE Final    Comment: (NOTE) The Xpert SA Assay (FDA approved for NASAL specimens in patients 13 years of age and older), is one component of a comprehensive surveillance program. It is not intended to diagnose infection nor to guide or monitor treatment. Performed at [facility], [address], [place], [place] [address]      Time coordinating discharge:39 minutes  SIGNED:  Alwyn Ren, MD  [facility] 07/15/2022, 8:38 AM

## 2022-07-15 NOTE — Plan of Care (Signed)

## 2022-07-15 NOTE — Progress Notes (Signed)
Report called to Marchelle Folks at [facility] facility, pt transported by [facility] w all belongings in stable condition.

## 2022-07-19 ENCOUNTER — Encounter (HOSPITAL_COMMUNITY): Payer: Medicare Other

## 2022-07-19 ENCOUNTER — Ambulatory Visit: Payer: Medicare Other

## 2022-08-26 ENCOUNTER — Encounter (HOSPITAL_COMMUNITY): Payer: Self-pay | Admitting: Orthopedic Surgery

## 2022-08-26 NOTE — Progress Notes (Addendum)
For Anesthesia: PCP - Wilhelmina Mcardle, MD  Cardiologist - Yates Decamp, MD  Neurologist- Levert Feinstein, MD   Chest x-ray - 05/05/22 in [facility] EKG - 04/06/22 in [facility] Stress Test - N/A ECHO - greater than 2 years Cardiac Cath - N/A Remote loop recorder transmission. 03/31/2022  Pacemaker/ICD device last checked: N/A Pacemaker orders received: N/A Device Rep notified: N/A  Spinal Cord Stimulator: N/A  Sleep Study - 04/24/18 in [facility] CPAP - Yes  Fasting Blood Sugar - N/A Checks Blood Sugar _N/A____ times a day Date and result of last Hgb A1c-N/A  Last dose of GLP1 agonist- N/A GLP1 instructions: N/A  Last dose of SGLT-2 inhibitors-N/A  SGLT-2 instructions:N/A  Blood Thinner Instructions:N/A Aspirin Instructions:N/A Last Dose:N/A  Activity level: activities of daily living without stopping and without chest pain and/or shortness of breath      Anesthesia review: paroxysmal atrial fibrillation, HTN, History of Stroke, 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   Patient verbalized understanding of instructions reviewed via telephone.

## 2022-08-26 NOTE — Progress Notes (Signed)
Sent message, via epic in basket, requesting orders in epic from surgeon.  

## 2022-08-26 NOTE — Patient Instructions (Addendum)
Preop instructions for:  Brandon Mendoza    Date of Birth:   05/31/1940                   Date of Procedure:  Tuesday, August 30, 2022   Procedure:  RIGHT REPAIR QUADRICEP TENDON  Surgeon:  Dr. Durene Romans Facility contact: [facility]    Phone:  [phone]               Health Care POA: RN contact name/phone#:  Calton Dach, LPN                         and Fax #: [phone]   Transportation contact phone#: [facility]  Please send day of procedure:  Current med list  Medications taken the day of procedure (return attached form to hospital) confirm time of nothing by mouth status (return attached form to hospital) Patient Demographic info( to include DNR status, problem list, allergies) Bring Insurance card and picture ID    Time to arrive at [facility]: 1:00 PM   Report to: Admitting (On your left hand side)    Do not eat solid food past midnight the night before your procedure.(To include any tube feedings-must be discontinued)  May have the following until  1:00 PM day of procedure  CLEAR LIQUID DIET  Water Black Coffee (sugar ok, NO MILK/CREAM OR CREAMERS)  Tea (sugar ok, NO MILK/CREAM OR CREAMERS) regular and decaf                             Plain Jell-O (NO RED)                                           Fruit ices (not with fruit pulp, NO RED)                                     Popsicles (NO RED)                                                                  Juice: apple, WHITE grape, WHITE cranberry Sports drinks like Gatorade (NO RED)   Take these morning medications only with sips of water.(or give through gastrostomy or feeding tube).  Flecainide  Sertraline  Bring CPAP mask and tubing day of surgery.   Oral Hygiene is also important to reduce your risk of infection.                                    Remember - BRUSH YOUR TEETH THE MORNING OF SURGERY WITH YOUR REGULAR TOOTHPASTE   DENTURES WILL BE REMOVED PRIOR TO SURGERY PLEASE DO NOT  APPLY "Poly grip" OR ADHESIVES!!!  Leave all jewelry and other valuables at place where living( no metal or rings to be worn) No contact lens  Men-no colognes,lotions   Any questions day of procedure,call  [facility]-[phone]     Sent from :  [facility]                   [phone]                   Fax:[phone]   Sent by :    Marnisha Stampley BSN, RN

## 2022-08-29 NOTE — Progress Notes (Signed)
I spoke with Danella Deis, nurse at [facility] and she confirmed they had received pre op instructions. [name] verbalized understanding at this time. I also spoke directly to [name] and reviewed pre op instructions with him as well and he also verbalized understanding.

## 2022-08-29 NOTE — Progress Notes (Signed)
Anesthesia Chart Review   Case: [id number] Date/Time: 08/30/22 1600   Procedure: REPAIR QUADRICEP TENDON (Right)   Anesthesia type: Choice   Pre-op diagnosis: Ruptured right quad tendon   Location: [facility] ROOM 09 / [facility]   Surgeons: Durene Romans, MD       DISCUSSION:82 y.o. former smoker with h/o HTN, PVD, COPD, sleep apnea, PAF, CKD Stage III, ruptured right quad tendon scheduled for above procedure 08/30/2022 with Dr. Durene Romans.   Per previous anesthesia note, "History includes former smoker (quit 08/26/04), HTN, HLD, CVA (05/01/11), carotid artery stenosis (left carotid endarterectomy for symptomatic disease 12/09/11), PVD, dysrhythmia (PAF, atrial tachycardia), OSA (wears CPAP), COPD. He is s/p  loop recorder on 10/22/20 for further evaluation for recurrent falls, which may be related to lumbar stenosis or dysequilibrium. No current alcohol use documented, but previously had 2-3 cocktails per day. MRI Abd 10/28/20 (ordered by Karlene Lineman, PA-C) showed a complex right renal cyst with six month follow-up recommended.   - Admit to [facility] 09/14/20-09/17/20 for fall with large right thigh hematoma in setting of Eliquis for PAF. Kcentra given on admission. Orthopedics consulted and managed medically. Transfused with 1 unit PRBC for ABL anemia. Decision made to discontinue Elilquis given recurrent falls and resulting hematoma. Out-patient cardiology and orthopedic follow-up. "  Pt last seen by cardiology 04/06/2022. Per OV note Pt stable vrom cardiac standpoint. Pt on flecainide for AF, no anticoagulation, per notes afib burden very low on flecainide.   Admission 4/12-4/19/2024 due to right quadriceps tendon tear after a fall.   S/p right quadricep tendon repair 07/12/2022 with no anesthesia complications noted.   Evaluate DOS, pt same day workup, not seen in PAT clinic.  VS: There were no vitals taken for this visit.  PROVIDERS: Lula Olszewski, MD is PCP   Yates Decamp, MD is  Cardiologist  LABS:  labs DOS, same day workup (all labs ordered are listed, but only abnormal results are displayed)  Labs Reviewed - No data to display   IMAGES:   EKG:   CV: Echo 11/30/2011 Study Conclusions   Left ventricle: The cavity size was normal. Wall thickness  was normal. Systolic function was normal. The estimated  ejection fraction was in the range of 60% to 65%.    Echocardiography.  M-mode, complete 2D, spectral Doppler,  and color Doppler.  Patient status:  Inpatient.  Location:  Echo laboratory.  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   The spinal cord appears normal. 2.   No spinal stenosis. 3.   Mild multilevel degenerative changes as detailed above that do not lead to spinal stenosis or nerve root compression. 4.   T2 hyperintense foci within  the pons consistent with chronic microvascular ischemic changes.   Loop - Medtronic Linq 10/22/2020 10/22/2020   Nocturia    Paroxysmal atrial fibrillation (HCC)    Peripheral neuropathy    Peripheral vascular disease (HCC)    Sleep apnea    on 6 cm, nasal pillow   Spinal stenosis    Stroke (HCC) 05/01/2011   ischemic   Weakness generalized 10/21/2021   Severe, legs and arms    Past Surgical History:  Procedure Laterality Date   CAROTID ENDARTERECTOMY   12/09/11   Left cea   ENDARTERECTOMY  12/09/2011   Procedure: ENDARTERECTOMY CAROTID;  Surgeon: Larina Earthly, MD;  Location: [facility] OR;  Service: Vascular;  Laterality: Left;   EYE SURGERY     rt retina detachment   HAMMER TOE SURGERY  2004   HERNIA REPAIR  2006   QUADRICEPS TENDON REPAIR Right 07/12/2022   Procedure: REPAIR QUADRICEP TENDON;  Surgeon: Durene Romans, MD;  Location: [facility];  Service: Orthopedics;  Laterality: Right;   SHOULDER ARTHROSCOPY W/ ROTATOR CUFF REPAIR  2010   TONSILLECTOMY      MEDICATIONS: No current facility-administered medications for this encounter.    albuterol (VENTOLIN HFA) 108 (90 Base) MCG/ACT inhaler   aspirin EC 81 MG tablet   DULoxetine (CYMBALTA) 30 MG capsule   flecainide (TAMBOCOR) 100 MG tablet   losartan-hydrochlorothiazide (HYZAAR) 50-12.5 MG tablet   methocarbamol (ROBAXIN) 500 MG tablet   Multiple Vitamin (MULTIVITAMIN WITH MINERALS) TABS tablet   ondansetron (ZOFRAN) 4 MG tablet   oxyCODONE (OXY IR/ROXICODONE) 5 MG immediate release tablet   polyethylene glycol (MIRALAX / GLYCOLAX) 17 g packet   rosuvastatin (CRESTOR) 10 MG tablet   senna-docusate (SENOKOT-S) 8.6-50 MG tablet   sertraline (ZOLOFT) 50 MG tablet   bisacodyl (DULCOLAX) 5 MG EC tablet   docusate sodium (COLACE) 100 MG capsule   losartan (COZAAR) 25 MG tablet   rosuvastatin (CRESTOR) 20 MG tablet    [facility] Ward, PA-C [facility] Pre-Surgical Testing [phone]

## 2022-08-30 ENCOUNTER — Other Ambulatory Visit: Payer: Self-pay

## 2022-08-30 ENCOUNTER — Ambulatory Visit (HOSPITAL_COMMUNITY): Payer: Medicare Other | Admitting: Physician Assistant

## 2022-08-30 ENCOUNTER — Encounter (HOSPITAL_COMMUNITY): Payer: Self-pay | Admitting: Orthopedic Surgery

## 2022-08-30 ENCOUNTER — Observation Stay (HOSPITAL_COMMUNITY)
Admission: RE | Admit: 2022-08-30 | Discharge: 2022-09-05 | Disposition: A | Discharge status: Home Health | Payer: Medicare Other | Attending: Orthopedic Surgery | Admitting: Orthopedic Surgery

## 2022-08-30 ENCOUNTER — Encounter (HOSPITAL_COMMUNITY)
Admission: RE | Disposition: A | Discharge status: Home Health | Payer: Self-pay | Source: Home / Self Care | Attending: Orthopedic Surgery

## 2022-08-30 ENCOUNTER — Ambulatory Visit (HOSPITAL_BASED_OUTPATIENT_CLINIC_OR_DEPARTMENT_OTHER): Payer: Medicare Other | Admitting: Physician Assistant

## 2022-08-30 DIAGNOSIS — J449 Chronic obstructive pulmonary disease, unspecified: Secondary | ICD-10-CM | POA: Insufficient documentation

## 2022-08-30 DIAGNOSIS — S76111D Strain of right quadriceps muscle, fascia and tendon, subsequent encounter: Secondary | ICD-10-CM

## 2022-08-30 DIAGNOSIS — N183 Chronic kidney disease, stage 3 unspecified: Secondary | ICD-10-CM | POA: Diagnosis not present

## 2022-08-30 DIAGNOSIS — Z79899 Other long term (current) drug therapy: Secondary | ICD-10-CM | POA: Insufficient documentation

## 2022-08-30 DIAGNOSIS — I129 Hypertensive chronic kidney disease with stage 1 through stage 4 chronic kidney disease, or unspecified chronic kidney disease: Secondary | ICD-10-CM | POA: Insufficient documentation

## 2022-08-30 DIAGNOSIS — S76111A Strain of right quadriceps muscle, fascia and tendon, initial encounter: Secondary | ICD-10-CM | POA: Diagnosis present

## 2022-08-30 DIAGNOSIS — X58XXXA Exposure to other specified factors, initial encounter: Secondary | ICD-10-CM | POA: Insufficient documentation

## 2022-08-30 DIAGNOSIS — Z87891 Personal history of nicotine dependence: Secondary | ICD-10-CM | POA: Insufficient documentation

## 2022-08-30 DIAGNOSIS — Z8673 Personal history of transient ischemic attack (TIA), and cerebral infarction without residual deficits: Secondary | ICD-10-CM | POA: Insufficient documentation

## 2022-08-30 DIAGNOSIS — I480 Paroxysmal atrial fibrillation: Secondary | ICD-10-CM | POA: Diagnosis not present

## 2022-08-30 DIAGNOSIS — I10 Essential (primary) hypertension: Secondary | ICD-10-CM | POA: Diagnosis not present

## 2022-08-30 HISTORY — DX: Paroxysmal atrial fibrillation: I48.0

## 2022-08-30 HISTORY — DX: Spinal stenosis, site unspecified: M48.00

## 2022-08-30 HISTORY — DX: Polyneuropathy, unspecified: G62.9

## 2022-08-30 HISTORY — PX: QUADRICEPS TENDON REPAIR: SHX756

## 2022-08-30 HISTORY — DX: Chronic kidney disease, unspecified: N18.9

## 2022-08-30 SURGERY — REPAIR, TENDON, QUADRICEPS
Anesthesia: Spinal | Site: Knee | Laterality: Right

## 2022-08-30 MED ORDER — BUPIVACAINE IN DEXTROSE 0.75-8.25 % IT SOLN
INTRATHECAL | Status: DC | PRN
  Administered 2022-08-30: 1.6 mL via INTRATHECAL

## 2022-08-30 MED ORDER — ACETAMINOPHEN 500 MG PO TABS
1000.0000 mg | ORAL_TABLET | Freq: Once | ORAL | Status: AC
  Administered 2022-08-30: 1000 mg via ORAL
  Filled 2022-08-30: qty 2

## 2022-08-30 MED ORDER — CEFAZOLIN SODIUM-DEXTROSE 2-4 GM/100ML-% IV SOLN
2.0000 g | INTRAVENOUS | Status: AC
  Administered 2022-08-30: 2 g via INTRAVENOUS
  Filled 2022-08-30: qty 100

## 2022-08-30 MED ORDER — FENTANYL CITRATE PF 50 MCG/ML IJ SOSY: 25.0000 ug | PREFILLED_SYRINGE | INTRAMUSCULAR | Status: DC | PRN

## 2022-08-30 MED ORDER — FENTANYL CITRATE PF 50 MCG/ML IJ SOSY
50.0000 ug | PREFILLED_SYRINGE | INTRAMUSCULAR | Status: DC
  Administered 2022-08-30: 100 ug via INTRAVENOUS
  Filled 2022-08-30: qty 2

## 2022-08-30 MED ORDER — PROPOFOL 500 MG/50ML IV EMUL
INTRAVENOUS | Status: DC | PRN
  Administered 2022-08-30: 75 ug/kg/min via INTRAVENOUS

## 2022-08-30 MED ORDER — POVIDONE-IODINE 10 % EX SWAB: 2.0000 | Freq: Once | CUTANEOUS | Status: DC

## 2022-08-30 MED ORDER — ALBUTEROL SULFATE HFA 108 (90 BASE) MCG/ACT IN AERS: 2.0000 | INHALATION_SPRAY | Freq: Four times a day (QID) | RESPIRATORY_TRACT | Status: DC | PRN

## 2022-08-30 MED ORDER — ONDANSETRON HCL 4 MG PO TABS: 4.0000 mg | ORAL_TABLET | Freq: Four times a day (QID) | ORAL | Status: DC | PRN

## 2022-08-30 MED ORDER — MIDAZOLAM HCL 2 MG/2ML IJ SOLN
1.0000 mg | INTRAMUSCULAR | Status: DC
  Administered 2022-08-30: 1 mg via INTRAVENOUS
  Filled 2022-08-30: qty 2

## 2022-08-30 MED ORDER — METOCLOPRAMIDE HCL 5 MG/ML IJ SOLN: 5.0000 mg | Freq: Three times a day (TID) | INTRAMUSCULAR | Status: DC | PRN

## 2022-08-30 MED ORDER — PHENOL 1.4 % MT LIQD: 1.0000 | OROMUCOSAL | Status: DC | PRN

## 2022-08-30 MED ORDER — DULOXETINE HCL 30 MG PO CPEP
30.0000 mg | ORAL_CAPSULE | Freq: Every day | ORAL | Status: DC
  Administered 2022-08-30 – 2022-09-04 (×6): 30 mg via ORAL
  Filled 2022-08-30 (×6): qty 1

## 2022-08-30 MED ORDER — MENTHOL 3 MG MT LOZG: 1.0000 | LOZENGE | OROMUCOSAL | Status: DC | PRN

## 2022-08-30 MED ORDER — LOSARTAN POTASSIUM 50 MG PO TABS
50.0000 mg | ORAL_TABLET | Freq: Every day | ORAL | Status: DC
  Administered 2022-08-31 – 2022-09-05 (×6): 50 mg via ORAL
  Filled 2022-08-30 (×6): qty 1

## 2022-08-30 MED ORDER — METOCLOPRAMIDE HCL 5 MG PO TABS: 5.0000 mg | ORAL_TABLET | Freq: Three times a day (TID) | ORAL | Status: DC | PRN

## 2022-08-30 MED ORDER — ACETAMINOPHEN 500 MG PO TABS
1000.0000 mg | ORAL_TABLET | Freq: Four times a day (QID) | ORAL | Status: AC
  Administered 2022-08-30 – 2022-09-02 (×7): 1000 mg via ORAL
  Filled 2022-08-30 (×9): qty 2

## 2022-08-30 MED ORDER — ROSUVASTATIN CALCIUM 10 MG PO TABS
10.0000 mg | ORAL_TABLET | Freq: Every day | ORAL | Status: DC
  Administered 2022-08-30 – 2022-09-04 (×6): 10 mg via ORAL
  Filled 2022-08-30 (×6): qty 1

## 2022-08-30 MED ORDER — HYDROMORPHONE HCL 1 MG/ML IJ SOLN: 0.5000 mg | INTRAMUSCULAR | Status: DC | PRN

## 2022-08-30 MED ORDER — DEXAMETHASONE SODIUM PHOSPHATE 10 MG/ML IJ SOLN
10.0000 mg | Freq: Once | INTRAMUSCULAR | Status: AC
  Administered 2022-08-31: 10 mg via INTRAVENOUS
  Filled 2022-08-30: qty 1

## 2022-08-30 MED ORDER — LACTATED RINGERS IV SOLN: INTRAVENOUS | Status: DC

## 2022-08-30 MED ORDER — PROPOFOL 1000 MG/100ML IV EMUL
INTRAVENOUS | Status: AC
  Filled 2022-08-30: qty 100

## 2022-08-30 MED ORDER — SERTRALINE HCL 50 MG PO TABS
50.0000 mg | ORAL_TABLET | Freq: Every day | ORAL | Status: DC
  Administered 2022-08-31 – 2022-09-05 (×6): 50 mg via ORAL
  Filled 2022-08-30 (×6): qty 1

## 2022-08-30 MED ORDER — LOSARTAN POTASSIUM-HCTZ 50-12.5 MG PO TABS: 1.0000 | ORAL_TABLET | Freq: Every morning | ORAL | Status: DC

## 2022-08-30 MED ORDER — SODIUM CHLORIDE (PF) 0.9 % IJ SOLN
INTRAMUSCULAR | Status: AC
  Filled 2022-08-30: qty 50

## 2022-08-30 MED ORDER — LACTATED RINGERS IV SOLN: INTRAVENOUS | Status: DC | PRN

## 2022-08-30 MED ORDER — ONDANSETRON HCL 4 MG/2ML IJ SOLN: 4.0000 mg | Freq: Four times a day (QID) | INTRAMUSCULAR | Status: DC | PRN

## 2022-08-30 MED ORDER — METHOCARBAMOL 1000 MG/10ML IJ SOLN: 500.0000 mg | Freq: Four times a day (QID) | INTRAVENOUS | Status: DC | PRN

## 2022-08-30 MED ORDER — TRANEXAMIC ACID-NACL 1000-0.7 MG/100ML-% IV SOLN
1000.0000 mg | Freq: Once | INTRAVENOUS | Status: AC
  Administered 2022-08-30: 1000 mg via INTRAVENOUS
  Filled 2022-08-30: qty 100

## 2022-08-30 MED ORDER — OXYCODONE HCL 5 MG PO TABS: 10.0000 mg | ORAL_TABLET | ORAL | Status: DC | PRN

## 2022-08-30 MED ORDER — EPHEDRINE 5 MG/ML INJ
INTRAVENOUS | Status: AC
  Filled 2022-08-30: qty 5

## 2022-08-30 MED ORDER — ONDANSETRON HCL 4 MG/2ML IJ SOLN
INTRAMUSCULAR | Status: AC
  Filled 2022-08-30: qty 2

## 2022-08-30 MED ORDER — SODIUM CHLORIDE (PF) 0.9 % IJ SOLN
INTRAMUSCULAR | Status: AC
  Filled 2022-08-30: qty 30

## 2022-08-30 MED ORDER — HYDROCHLOROTHIAZIDE 12.5 MG PO TABS
12.5000 mg | ORAL_TABLET | Freq: Every day | ORAL | Status: DC
  Administered 2022-08-31 – 2022-09-05 (×6): 12.5 mg via ORAL
  Filled 2022-08-30 (×6): qty 1

## 2022-08-30 MED ORDER — ASPIRIN 81 MG PO CHEW
81.0000 mg | CHEWABLE_TABLET | Freq: Two times a day (BID) | ORAL | Status: DC
  Administered 2022-08-30 – 2022-09-05 (×12): 81 mg via ORAL
  Filled 2022-08-30 (×13): qty 1

## 2022-08-30 MED ORDER — FLECAINIDE ACETATE 100 MG PO TABS
100.0000 mg | ORAL_TABLET | Freq: Two times a day (BID) | ORAL | Status: DC
  Administered 2022-08-30 – 2022-09-05 (×12): 100 mg via ORAL
  Filled 2022-08-30 (×12): qty 1

## 2022-08-30 MED ORDER — CEFAZOLIN SODIUM-DEXTROSE 2-4 GM/100ML-% IV SOLN
2.0000 g | Freq: Four times a day (QID) | INTRAVENOUS | Status: AC
  Administered 2022-08-30 – 2022-08-31 (×2): 2 g via INTRAVENOUS
  Filled 2022-08-30 (×2): qty 100

## 2022-08-30 MED ORDER — PHENYLEPHRINE HCL-NACL 20-0.9 MG/250ML-% IV SOLN
INTRAVENOUS | Status: DC | PRN
  Administered 2022-08-30: 20 ug/min via INTRAVENOUS

## 2022-08-30 MED ORDER — BUPIVACAINE HCL 0.25 % IJ SOLN
INTRAMUSCULAR | Status: AC
  Filled 2022-08-30: qty 1

## 2022-08-30 MED ORDER — OXYCODONE HCL 5 MG PO TABS
5.0000 mg | ORAL_TABLET | ORAL | Status: DC | PRN
  Administered 2022-08-30: 5 mg via ORAL
  Filled 2022-08-30 (×2): qty 1

## 2022-08-30 MED ORDER — DOCUSATE SODIUM 100 MG PO CAPS
100.0000 mg | ORAL_CAPSULE | Freq: Two times a day (BID) | ORAL | Status: DC
  Administered 2022-08-31 – 2022-09-02 (×5): 100 mg via ORAL
  Filled 2022-08-30 (×8): qty 1

## 2022-08-30 MED ORDER — DIPHENHYDRAMINE HCL 12.5 MG/5ML PO ELIX: 12.5000 mg | ORAL_SOLUTION | ORAL | Status: DC | PRN

## 2022-08-30 MED ORDER — BISACODYL 10 MG RE SUPP: 10.0000 mg | Freq: Every day | RECTAL | Status: DC | PRN

## 2022-08-30 MED ORDER — EPHEDRINE SULFATE (PRESSORS) 50 MG/ML IJ SOLN
INTRAMUSCULAR | Status: DC | PRN
  Administered 2022-08-30 (×3): 10 mg via INTRAVENOUS

## 2022-08-30 MED ORDER — BUPIVACAINE-EPINEPHRINE (PF) 0.5% -1:200000 IJ SOLN
INTRAMUSCULAR | Status: DC | PRN
  Administered 2022-08-30: 20 mL via PERINEURAL

## 2022-08-30 MED ORDER — TRANEXAMIC ACID-NACL 1000-0.7 MG/100ML-% IV SOLN
1000.0000 mg | INTRAVENOUS | Status: AC
  Administered 2022-08-30: 1000 mg via INTRAVENOUS
  Filled 2022-08-30: qty 100

## 2022-08-30 MED ORDER — KETOROLAC TROMETHAMINE 30 MG/ML IJ SOLN
INTRAMUSCULAR | Status: AC
  Filled 2022-08-30: qty 1

## 2022-08-30 MED ORDER — DEXAMETHASONE SODIUM PHOSPHATE 10 MG/ML IJ SOLN
INTRAMUSCULAR | Status: AC
  Filled 2022-08-30: qty 1

## 2022-08-30 MED ORDER — 0.9 % SODIUM CHLORIDE (POUR BTL) OPTIME
TOPICAL | Status: DC | PRN
  Administered 2022-08-30: 1000 mL

## 2022-08-30 MED ORDER — ROCURONIUM BROMIDE 10 MG/ML (PF) SYRINGE
PREFILLED_SYRINGE | INTRAVENOUS | Status: AC
  Filled 2022-08-30: qty 30

## 2022-08-30 MED ORDER — LIDOCAINE HCL (PF) 2 % IJ SOLN
INTRAMUSCULAR | Status: AC
  Filled 2022-08-30: qty 10

## 2022-08-30 MED ORDER — SODIUM CHLORIDE 0.9 % IV SOLN: INTRAVENOUS | Status: DC

## 2022-08-30 MED ORDER — LOSARTAN POTASSIUM 25 MG PO TABS: 25.0000 mg | ORAL_TABLET | Freq: Every day | ORAL | Status: DC

## 2022-08-30 MED ORDER — DEXAMETHASONE SODIUM PHOSPHATE 10 MG/ML IJ SOLN
8.0000 mg | Freq: Once | INTRAMUSCULAR | Status: AC
  Administered 2022-08-30: 8 mg via INTRAVENOUS

## 2022-08-30 MED ORDER — CHLORHEXIDINE GLUCONATE 0.12 % MT SOLN
15.0000 mL | Freq: Once | OROMUCOSAL | Status: AC
  Administered 2022-08-30: 15 mL via OROMUCOSAL

## 2022-08-30 MED ORDER — ONDANSETRON HCL 4 MG/2ML IJ SOLN
INTRAMUSCULAR | Status: DC | PRN
  Administered 2022-08-30: 4 mg via INTRAVENOUS

## 2022-08-30 MED ORDER — EPINEPHRINE PF 1 MG/ML IJ SOLN
INTRAMUSCULAR | Status: AC
  Filled 2022-08-30: qty 1

## 2022-08-30 MED ORDER — ORAL CARE MOUTH RINSE: 15.0000 mL | Freq: Once | OROMUCOSAL | Status: AC

## 2022-08-30 MED ORDER — POLYETHYLENE GLYCOL 3350 17 G PO PACK
17.0000 g | PACK | Freq: Two times a day (BID) | ORAL | Status: DC
  Administered 2022-08-31 – 2022-09-03 (×5): 17 g via ORAL
  Filled 2022-08-30 (×7): qty 1

## 2022-08-30 MED ORDER — ALBUTEROL SULFATE (2.5 MG/3ML) 0.083% IN NEBU: 2.5000 mg | INHALATION_SOLUTION | Freq: Four times a day (QID) | RESPIRATORY_TRACT | Status: DC | PRN

## 2022-08-30 MED ORDER — METHOCARBAMOL 500 MG PO TABS
500.0000 mg | ORAL_TABLET | Freq: Four times a day (QID) | ORAL | Status: DC | PRN
  Administered 2022-08-30 – 2022-08-31 (×2): 500 mg via ORAL
  Filled 2022-08-30 (×2): qty 1

## 2022-08-30 SURGICAL SUPPLY — 55 items
ADH SKN CLS APL DERMABOND .7 (GAUZE/BANDAGES/DRESSINGS) ×1
BAG COUNTER SPONGE SURGICOUNT (BAG) IMPLANT
BAG SPEC THK2 15X12 ZIP CLS (MISCELLANEOUS) ×1
BAG SPNG CNTER NS LX DISP (BAG)
BAG ZIPLOCK 12X15 (MISCELLANEOUS) ×1 IMPLANT
BIT DRILL 2.8X128 (BIT) ×1 IMPLANT
BLADE SAW SGTL 11.0X1.19X90.0M (BLADE) IMPLANT
BNDG CMPR 5X62 HK CLSR LF (GAUZE/BANDAGES/DRESSINGS) ×1
BNDG CMPR 6"X 5 YARDS HK CLSR (GAUZE/BANDAGES/DRESSINGS) ×1
BNDG ELASTIC 6INX 5YD STR LF (GAUZE/BANDAGES/DRESSINGS) ×1 IMPLANT
COVER SURGICAL LIGHT HANDLE (MISCELLANEOUS) ×1 IMPLANT
CUFF TOURN SGL QUICK 34 (TOURNIQUET CUFF)
CUFF TRNQT CYL 34X4.125X (TOURNIQUET CUFF) IMPLANT
DERMABOND ADVANCED .7 DNX12 (GAUZE/BANDAGES/DRESSINGS) ×1 IMPLANT
DRAPE INCISE IOBAN 66X45 STRL (DRAPES) ×3 IMPLANT
DRAPE U-SHAPE 47X51 STRL (DRAPES) IMPLANT
DRESSING AQUACEL AG SP 3.5X10 (GAUZE/BANDAGES/DRESSINGS) IMPLANT
DRSG AQUACEL AG ADV 3.5X10 (GAUZE/BANDAGES/DRESSINGS) ×1 IMPLANT
DRSG AQUACEL AG SP 3.5X10 (GAUZE/BANDAGES/DRESSINGS) ×1
DURAPREP 26ML APPLICATOR (WOUND CARE) ×1 IMPLANT
ELECT REM PT RETURN 15FT ADLT (MISCELLANEOUS) ×1 IMPLANT
GAUZE SPONGE 4X4 12PLY STRL (GAUZE/BANDAGES/DRESSINGS) IMPLANT
GLOVE BIO SURGEON STRL SZ 6 (GLOVE) IMPLANT
GLOVE BIOGEL PI IND STRL 6.5 (GLOVE) IMPLANT
GLOVE BIOGEL PI IND STRL 7.5 (GLOVE) ×3 IMPLANT
GLOVE BIOGEL PI IND STRL 8.5 (GLOVE) ×1 IMPLANT
GLOVE ECLIPSE 8.0 STRL XLNG CF (GLOVE) ×1 IMPLANT
GLOVE INDICATOR 6.5 STRL GRN (GLOVE) ×1 IMPLANT
GLOVE ORTHO TXT STRL SZ7.5 (GLOVE) IMPLANT
GOWN STRL REUS W/ TWL LRG LVL3 (GOWN DISPOSABLE) ×2 IMPLANT
GOWN STRL REUS W/TWL LRG LVL3 (GOWN DISPOSABLE) ×2
IMMOBILIZER KNEE 20 (SOFTGOODS) ×1
IMMOBILIZER KNEE 20 THIGH 36 (SOFTGOODS) ×1 IMPLANT
KIT TURNOVER KIT A (KITS) IMPLANT
MANIFOLD NEPTUNE II (INSTRUMENTS) ×1 IMPLANT
NDL SAFETY ECLIP 18X1.5 (MISCELLANEOUS) IMPLANT
NS IRRIG 1000ML POUR BTL (IV SOLUTION) ×1 IMPLANT
PACK TOTAL KNEE CUSTOM (KITS) ×1 IMPLANT
PADDING CAST ABS COTTON 4X4 ST (CAST SUPPLIES) IMPLANT
PADDING CAST COTTON 6X4 STRL (CAST SUPPLIES) IMPLANT
PASSER SUT SWANSON 36MM LOOP (INSTRUMENTS) ×1 IMPLANT
PROTECTOR NERVE ULNAR (MISCELLANEOUS) ×1 IMPLANT
SOL PREP POV-IOD 4OZ 10% (MISCELLANEOUS) IMPLANT
SUT FIBERWIRE #2 38 T-5 BLUE (SUTURE) ×1
SUT MNCRL AB 4-0 PS2 18 (SUTURE) ×1 IMPLANT
SUT VIC AB 0 CT1 27 (SUTURE) ×1
SUT VIC AB 0 CT1 27XBRD ANTBC (SUTURE) ×1 IMPLANT
SUT VIC AB 1 CT1 36 (SUTURE) ×2 IMPLANT
SUT VIC AB 2-0 CT1 27 (SUTURE) ×2
SUT VIC AB 2-0 CT1 TAPERPNT 27 (SUTURE) ×2 IMPLANT
SUTURE FIBERWR #2 38 T-5 BLUE (SUTURE) ×1 IMPLANT
SYR 3ML LL SCALE MARK (SYRINGE) IMPLANT
TOWEL OR 17X26 10 PK STRL BLUE (TOWEL DISPOSABLE) IMPLANT
TUBE SUCTION HIGH CAP CLEAR NV (SUCTIONS) IMPLANT
WATER STERILE IRR 1000ML POUR (IV SOLUTION) ×1 IMPLANT

## 2022-08-30 NOTE — Anesthesia Postprocedure Evaluation (Signed)
Anesthesia Post Note  Patient: Brandon Mendoza  Procedure(s) Performed: REPAIR QUADRICEP TENDON (Right: Knee)     Patient location during evaluation: PACU Anesthesia Type: Spinal and Regional Level of consciousness: oriented and awake and alert Pain management: pain level controlled Vital Signs Assessment: post-procedure vital signs reviewed and stable Respiratory status: spontaneous breathing and respiratory function stable Cardiovascular status: blood pressure returned to baseline and stable Postop Assessment: no headache, no backache, no apparent nausea or vomiting, spinal receding and patient able to bend at knees Anesthetic complications: no  No notable events documented.  Last Vitals:  Vitals:   08/30/22 1630 08/30/22 1645  BP: (!) 155/87 (!) 141/78  Pulse: 71 71  Resp: 19 (!) 21  Temp:    SpO2: 96% 100%    Last Pain:  Vitals:   08/30/22 1645  TempSrc:   PainSc: 0-No pain                 Burney Calzadilla,W. EDMOND

## 2022-08-30 NOTE — Plan of Care (Signed)
  Problem: Activity: Goal: Ability to avoid complications of mobility impairment will improve Outcome: Progressing Goal: Range of joint motion will improve Outcome: Progressing   Problem: Pain Management: Goal: Pain level will decrease with appropriate interventions Outcome: Progressing   Problem: Safety: Goal: Ability to remain free from injury will improve Outcome: Progressing   

## 2022-08-30 NOTE — Anesthesia Procedure Notes (Signed)
Spinal  Patient location during procedure: OR Start time: 08/30/2022 1:24 PM End time: 08/30/2022 1:34 PM Reason for block: surgical anesthesia Staffing Performed: anesthesiologist  Anesthesiologist: Gaynelle Adu, MD Performed by: Gaynelle Adu, MD Authorized by: Gaynelle Adu, MD   Preanesthetic Checklist Completed: patient identified, IV checked, risks and benefits discussed, surgical consent, monitors and equipment checked, pre-op evaluation and timeout performed Spinal Block Patient position: sitting Prep: DuraPrep Patient monitoring: cardiac monitor, continuous pulse ox and blood pressure Approach: midline (Attempted R Paramedian.) Location: L2-3 Injection technique: single-shot Needle Needle type: Quincke (Attempted Pencan 24G. Unable to locate the space.)  Needle gauge: 22 G Needle length: 9 cm Assessment Sensory level: T8 Events: CSF return Additional Notes Functioning IV was confirmed and monitors were applied. Sterile prep and drape, including hand hygiene and sterile gloves were used. The patient was positioned and the spine was prepped. The skin was anesthetized with lidocaine.  Free flow of clear CSF was obtained prior to injecting local anesthetic into the CSF.  The spinal needle aspirated freely following injection.  The needle was carefully withdrawn.  The patient tolerated the procedure well.

## 2022-08-30 NOTE — H&P (Signed)
TOTAL KNEE ADMISSION H&P  Patient is being admitted for right quadriceps tendon repair Subjective:  Chief Complaint: Recurrent right quadriceps tendon rupture  HPI: Brandon Mendoza, 82 y.o. male. He presented to the hospital with quad tendon rupture initially on 07/09/22. He had quad tendon repair by Dr. Charlann Boxer on 07/12/22. Unfortunately at his post op visit he had extensor lag, and STAT MRI found recurrent rupture. Decision was made for surgery.   Patient Active Problem List   Diagnosis Date Noted   Quadriceps tendon rupture, right, initial encounter 07/12/2022   Unable to care for self 07/08/2022   Dehydration 07/08/2022   Depression 07/08/2022   COPD (chronic obstructive pulmonary disease) (HCC) 07/08/2022   Macular degeneration 03/01/2022   Myofascial pain 02/10/2022   Lumbar spondylosis 01/03/2022   Acquired clawfoot, left foot 12/16/2021   Acquired clawfoot, right foot 12/16/2021   Claw foot 12/14/2021   Gait abnormality 12/10/2021   Spinal stenosis at L4-L5 level 12/10/2021   Ataxia 11/22/2021   Lightheadedness 11/02/2021   Claudication of lower extremity (HCC) 10/21/2021   Atrophy of calf muscles 10/21/2021   Weakness generalized 10/21/2021   Focal neurological deficit 10/21/2021   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   Pain in joint of right hip 09/14/2021   High arches 07/22/2021   Hammertoes of both feet 07/22/2021   Neuropathy 07/22/2021   Constipation 05/28/2021   Disequilibrium 05/28/2021   Personal history of colonic polyps 05/28/2021   Renal cyst 05/28/2021   Gastroesophageal reflux disease without esophagitis 05/05/2021   Ingrowing left great toenail 02/15/2021   S/P lumbar fusion 12/24/2020   Spinal stenosis of lumbar region 12/04/2020   Acquired complex renal cyst 11/23/2020   Functional gait abnormality 11/04/2020   Idiopathic neuropathy 11/02/2020   Tinea pedis of both feet 10/28/2020   Loop - Medtronic Linq 10/22/2020  10/22/2020   Benign hypertension with 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) 10/05/2020   Fall with injury 10/05/2020   Hematoma 09/16/2020   Normocytic anemia 09/14/2020   Leukocytosis 09/14/2020   AF (paroxysmal atrial fibrillation) (HCC) 09/14/2020   Chronic anticoagulation 09/14/2020   Hyponatremia 09/14/2020   Pain in left foot 01/22/2020   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   Pain in joint of right knee 12/27/2019   Recurrent falls 11/17/2019   Paroxysmal atrial fibrillation (HCC) 10/22/2019   Pain in right foot 10/14/2019   Falls 10/07/2019   Ataxia involving legs 08/12/2019   OSA on CPAP 06/20/2018   Urge incontinence 04/13/2018   Urinary urgency 04/13/2018   Pain in left knee 03/01/2018   Other intervertebral disc degeneration, lumbar region 06/16/2017   Acquired bilateral hammer toes 10/05/2015   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   Generalized anxiety disorder 05/12/2015   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   History of left-sided carotid endarterectomy 04/10/2014   Essential hypertension 04/10/2014   Occlusion and stenosis of carotid artery without mention of cerebral infarction 12/15/2011   Habitual alcohol use 12/01/2011   Carotid artery stenosis, symptomatic 11/30/2011   Cerebral infarction (HCC) 11/29/2011   Mixed hyperlipidemia    Past Medical History:  Diagnosis Date   Acquired clawfoot, right foot 12/16/2021   Anxiety    Atrophy of calf muscles 10/21/2021   Duplex calves was negative for significant blockage EMG was done at [facility] MRI lumbar spine was also done at [facility]   Carotid artery  occlusion    CKD (chronic kidney disease)    Stage 3   COPD (chronic obstructive pulmonary disease) (HCC)    Depression    Dysrhythmia    PAF, atrial tachycardia   Focal neurological deficit 10/21/2021   Noted he started and  drueling but worsening and worsening   Right side of face seems like it doesn't come up.   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   The spinal cord appears normal. 2.   No spinal stenosis. 3.   Mild multilevel degenerative changes as detailed above that do not lead to spinal stenosis or nerve root compression. 4.   T2 hyperintense foci within the pons consistent with chronic microvascular ischemic changes.   Loop - Medtronic Linq 10/22/2020 10/22/2020   Nocturia    Paroxysmal atrial fibrillation (HCC)    Peripheral neuropathy    Peripheral vascular disease (HCC)    Sleep apnea    on 6 cm, nasal pillow   Spinal stenosis    Stroke (HCC) 05/01/2011   ischemic   Weakness generalized 10/21/2021   Severe, legs and arms    Past Surgical History:  Procedure Laterality Date   CAROTID ENDARTERECTOMY  12/09/11   Left cea   ENDARTERECTOMY  12/09/2011   Procedure: ENDARTERECTOMY CAROTID;  Surgeon: Larina Earthly, MD;  Location: [facility] OR;  Service: Vascular;  Laterality: Left;   EYE SURGERY     rt retina detachment   HAMMER TOE SURGERY  2004   HERNIA REPAIR  2006   QUADRICEPS TENDON REPAIR Right 07/12/2022   Procedure: REPAIR QUADRICEP TENDON;  Surgeon: Durene Romans, MD;  Location: [facility];  Service: Orthopedics;  Laterality: Right;   SHOULDER ARTHROSCOPY W/ ROTATOR CUFF REPAIR  2010   TONSILLECTOMY      Current Facility-Administered Medications  Medication Dose Route Frequency Provider Last Rate Last Admin   ceFAZolin (ANCEF) IVPB 2g/100 mL premix  2 g Intravenous On Call to OR Cassandria Anger, PA-C       chlorhexidine (PERIDEX) 0.12 % solution 15 mL  15 mL Mouth/Throat Once Gaynelle Adu, MD       Or   Oral care mouth rinse  15 mL Mouth Rinse Once Gaynelle Adu, MD       dexamethasone (DECADRON) injection 8 mg  8 mg Intravenous Once Cassandria Anger, PA-C       fentaNYL (SUBLIMAZE) injection 50-100 mcg  50-100  mcg Intravenous Binnie Rail, MD       lactated ringers infusion   Intravenous Continuous Gaynelle Adu, MD       lactated ringers infusion   Intravenous Continuous Cassandria Anger, PA-C       midazolam (VERSED) injection 1-2 mg  1-2 mg Intravenous Binnie Rail, MD       povidone-iodine 10 % swab 2 Application  2 Application Topical Once Cassandria Anger, PA-C       tranexamic acid (CYKLOKAPRON) IVPB 1,000 mg  1,000 mg Intravenous To OR Cassandria Anger, PA-C       Allergies  Allergen Reactions   Sulfamethoxazole-Trimethoprim Nausea And Vomiting    Pt unaware of this intolerance    Social History   Tobacco Use   Smoking status: Former    Packs/day: 1.00    Years: 50.00    Additional pack years: 0.00    Total pack years: 50.00  Types: Cigarettes    Quit date: 08/26/2004    Years since quitting: 18.0    Passive exposure: Never   Smokeless tobacco: Never   Tobacco comments:    quit smoking 08/2004  Substance Use Topics   Alcohol use: Yes    Alcohol/week: 7.0 standard drinks of alcohol    Types: 7 Standard drinks or equivalent per week    Comment: occ    Family History  Problem Relation Age of Onset   Alzheimer's disease Father    Diabetes Other      Review of Systems  Constitutional:  Negative for chills and fever.  Respiratory:  Negative for cough and shortness of breath.   Cardiovascular:  Negative for chest pain.  Gastrointestinal:  Negative for nausea and vomiting.  Musculoskeletal:  Positive for arthralgias.     Objective:  Physical Exam Right Knee: Knee immobilizer removed Well healing incision Extensor lag on AROM  Vital signs in last 24 hours:    Labs:   Estimated body mass index is 28.65 kg/m as calculated from the following:   Height as of 07/12/22: 5\' 9"  (1.753 m).   Weight as of 07/12/22: 88 kg.   Imaging Review  MRI of the right knee shows recurrent complete quad tendon  rupture.      Assessment/Plan:  Quadriceps tendon rupture, right knee  The patient history, physical examination, clinical judgment of the provider and imaging studies are consistent with Quadriceps tendon rupture of the right knee(s) and total knee arthroplasty is deemed medically necessary. The treatment options including medical management, injection therapy arthroscopy and arthroplasty were discussed at length. The risks and benefits of total knee arthroplasty were presented and reviewed. The risks due to aseptic loosening, infection, stiffness, patella tracking problems, thromboembolic complications and other imponderables were discussed. The patient acknowledged the explanation, agreed to proceed with the plan and consent was signed. Patient is being admitted for inpatient treatment for surgery, pain control, PT, OT, prophylactic antibiotics, VTE prophylaxis, progressive ambulation and ADL's and discharge planning. The patient is planning to be discharged home.     Rosalene Billings, PA-C Orthopedic Surgery [facility] [place] [phone]

## 2022-08-30 NOTE — Transfer of Care (Signed)
Immediate Anesthesia Transfer of Care Note  Patient: Brandon Mendoza  Procedure(s) Performed: REPAIR QUADRICEP TENDON (Right: Knee)  Patient Location: PACU  Anesthesia Type:MAC combined with regional for post-op pain  Level of Consciousness: awake and alert   Airway & Oxygen Therapy: Patient Spontanous Breathing and Patient connected to nasal cannula oxygen  Post-op Assessment: Report given to RN and Post -op Vital signs reviewed and stable  Post vital signs: Reviewed and stable  Last Vitals:  Vitals Value Taken Time  BP 133/56 08/30/22 1458  Temp    Pulse 68 08/30/22 1500  Resp 18 08/30/22 1500  SpO2 97 % 08/30/22 1500  Vitals shown include unvalidated device data.  Last Pain:  Vitals:   08/30/22 1300  TempSrc:   PainSc: 0-No pain         Complications: No notable events documented.

## 2022-08-30 NOTE — Brief Op Note (Signed)
08/30/2022  2:55 PM  PATIENT:  Brandon Mendoza  82 y.o. male  PRE-OPERATIVE DIAGNOSIS:  re-rupture of right quad tendon  POST-OPERATIVE DIAGNOSIS:  re-rupture of right quad tendon  PROCEDURE:  Procedure(s): Revision REPAIR QUADRICEP TENDON (Right)\  2. Application of long leg splint  SURGEON:  Surgeon(s) and Role:    * Durene Romans, MD - Primary  PHYSICIAN ASSISTANT: Rosalene Billings, PA-C  ANESTHESIA:   regional and spinal  EBL:  50 mL   BLOOD ADMINISTERED:none  DRAINS: none   LOCAL MEDICATIONS USED:  NONE  SPECIMEN:  No Specimen  DISPOSITION OF SPECIMEN:  N/A  COUNTS:  YES  TOURNIQUET:   Total Tourniquet Time Documented: Thigh (Right) - 32 minutes Total: Thigh (Right) - 32 minutes   DICTATION: .Other Dictation: Dictation Number [id number]  PLAN OF CARE: Admit to inpatient   PATIENT DISPOSITION:  PACU - hemodynamically stable.   Delay start of Pharmacological VTE agent (>24hrs) due to surgical blood loss or risk of bleeding: no

## 2022-08-30 NOTE — Interval H&P Note (Signed)
History and Physical Interval Note:  08/30/2022 12:11 PM  Brandon Mendoza  has presented today for surgery, with the diagnosis of Ruptured right quad tendon.  The various methods of treatment have been discussed with the patient and family. After consideration of risks, benefits and other options for treatment, the patient has consented to  Procedure(s): REPAIR QUADRICEP TENDON (Right) as a surgical intervention.  The patient's history has been reviewed, patient examined, no change in status, stable for surgery.  I have reviewed the patient's chart and labs.  Questions were answered to the patient's satisfaction.     Shelda Pal

## 2022-08-30 NOTE — Anesthesia Procedure Notes (Signed)
Anesthesia Regional Block: Femoral nerve block   Pre-Anesthetic Checklist: , timeout performed,  Correct Patient, Correct Site, Correct Laterality,  Correct Procedure, Correct Position, site marked,  Risks and benefits discussed,  Pre-op evaluation,  At surgeon's request and post-op pain management  Laterality: Right  Prep: Maximum Sterile Barrier Precautions used, chloraprep       Needles:  Injection technique: Single-shot  Needle Type: Echogenic Stimulator Needle     Needle Length: 5cm  Needle Gauge: 22     Additional Needles:   Procedures:,,,, ultrasound used (permanent image in chart),,     Nerve Stimulator or Paresthesia:  Response: Patellar respose  Additional Responses:   Narrative:  Start time: 08/30/2022 12:32 PM End time: 08/30/2022 12:42 PM Injection made incrementally with aspirations every 5 mL.  Performed by: Personally  Anesthesiologist: Gaynelle Adu, MD

## 2022-08-30 NOTE — Discharge Instructions (Signed)
INSTRUCTIONS AFTER SURGERY  Remove items at home which could result in a fall. This includes throw rugs or furniture in walking pathways ICE to the affected joint every three hours while awake for 30 minutes at a time, for at least the first 3-5 days, and then as needed for pain and swelling.  Continue to use ice for pain and swelling. You may notice swelling that will progress down to the foot and ankle.  This is normal after surgery.  Elevate your leg when you are not up walking on it.   Continue to use the breathing machine you got in the hospital (incentive spirometer) which will help keep your temperature down.  It is common for your temperature to cycle up and down following surgery, especially at night when you are not up moving around and exerting yourself.  The breathing machine keeps your lungs expanded and your temperature down.   DIET:  As you were doing prior to hospitalization, we recommend a well-balanced diet.  DRESSING / WOUND CARE / SHOWERING  Keep the surgical dressing until follow up.  The dressing is water proof, so you can shower without any extra covering.  IF THE DRESSING FALLS OFF or the wound gets wet inside, change the dressing with sterile gauze.  Please use good hand washing techniques before changing the dressing.  Do not use any lotions or creams on the incision until instructed by your surgeon.    ACTIVITY  Increase activity slowly as tolerated, but follow the weight bearing instructions below.   No driving for 6 weeks or until further direction given by your physician.  You cannot drive while taking narcotics.  No lifting or carrying greater than 10 lbs. until further directed by your surgeon. Avoid periods of inactivity such as sitting longer than an hour when not asleep. This helps prevent blood clots.  You may return to work once you are authorized by your doctor.     WEIGHT BEARING   Other:  Non weight bearing  CONSTIPATION  Constipation is defined  medically as fewer than three stools per week and severe constipation as less than one stool per week.  Even if you have a regular bowel pattern at home, your normal regimen is likely to be disrupted due to multiple reasons following surgery.  Combination of anesthesia, postoperative narcotics, change in appetite and fluid intake all can affect your bowels.   YOU MUST use at least one of the following options; they are listed in order of increasing strength to get the job done.  They are all available over the counter, and you may need to use some, POSSIBLY even all of these options:    Drink plenty of fluids (prune juice may be helpful) and high fiber foods Colace 100 mg by mouth twice a day  Senokot for constipation as directed and as needed Dulcolax (bisacodyl), take with full glass of water  Miralax (polyethylene glycol) once or twice a day as needed.  If you have tried all these things and are unable to have a bowel movement in the first 3-4 days after surgery call either your surgeon or your primary doctor.    If you experience loose stools or diarrhea, hold the medications until you stool forms back up.  If your symptoms do not get better within 1 week or if they get worse, check with your doctor.  If you experience "the worst abdominal pain ever" or develop nausea or vomiting, please contact the office immediately for further   recommendations for treatment.   ITCHING:  If you experience itching with your medications, try taking only a single pain pill, or even half a pain pill at a time.  You can also use Benadryl over the counter for itching or also to help with sleep.   TED HOSE STOCKINGS:  Use stockings on both legs until for at least 2 weeks or as directed by physician office. They may be removed at night for sleeping.  MEDICATIONS:  See your medication summary on the "After Visit Summary" that nursing will review with you.  You may have some home medications which will be placed on hold  until you complete the course of blood thinner medication.  It is important for you to complete the blood thinner medication as prescribed.  PRECAUTIONS:  If you experience chest pain or shortness of breath - call 911 immediately for transfer to the hospital emergency department.   If you develop a fever greater that 101 F, purulent drainage from wound, increased redness or drainage from wound, foul odor from the wound/dressing, or calf pain - CONTACT YOUR SURGEON.                                                   FOLLOW-UP APPOINTMENTS:  If you do not already have a post-op appointment, please call the office for an appointment to be seen by your surgeon.  Guidelines for how soon to be seen are listed in your "After Visit Summary", but are typically between 1-4 weeks after surgery.  POST-OPERATIVE OPIOID TAPER INSTRUCTIONS: It is important to wean off of your opioid medication as soon as possible. If you do not need pain medication after your surgery it is ok to stop day one. Opioids include: Codeine, Hydrocodone(Norco, Vicodin), Oxycodone(Percocet, oxycontin) and hydromorphone amongst others.  Long term and even short term use of opiods can cause: Increased pain response Dependence Constipation Depression Respiratory depression And more.  Withdrawal symptoms can include Flu like symptoms Nausea, vomiting And more Techniques to manage these symptoms Hydrate well Eat regular healthy meals Stay active Use relaxation techniques(deep breathing, meditating, yoga) Do Not substitute Alcohol to help with tapering If you have been on opioids for less than two weeks and do not have pain than it is ok to stop all together.  Plan to wean off of opioids This plan should start within one week post op of your joint replacement. Maintain the same interval or time between taking each dose and first decrease the dose.  Cut the total daily intake of opioids by one tablet each day Next start to  increase the time between doses. The last dose that should be eliminated is the evening dose.   MAKE SURE YOU:  Understand these instructions.  Get help right away if you are not doing well or get worse.    Thank you for letting us be a part of your medical care team.  It is a privilege we respect greatly.  We hope these instructions will help you stay on track for a fast and full recovery!      

## 2022-08-30 NOTE — Anesthesia Preprocedure Evaluation (Addendum)
Anesthesia Evaluation  Patient identified by MRN, date of birth, ID band Patient awake    Reviewed: Allergy & Precautions, H&P , NPO status , Patient's Chart, lab work & pertinent test results  Airway Mallampati: II  TM Distance: >3 FB Neck ROM: Full    Dental no notable dental hx. (+) Teeth Intact, Dental Advisory Given   Pulmonary sleep apnea , COPD,  COPD inhaler, former smoker   Pulmonary exam normal breath sounds clear to auscultation       Cardiovascular hypertension, Pt. on medications + Peripheral Vascular Disease  + dysrhythmias Atrial Fibrillation  Rhythm:Regular Rate:Normal     Neuro/Psych   Anxiety Depression    CVA    GI/Hepatic Neg liver ROS,GERD  Medicated,,  Endo/Other  negative endocrine ROS    Renal/GU negative Renal ROS  negative genitourinary   Musculoskeletal  (+) Arthritis , Osteoarthritis,    Abdominal   Peds  Hematology  (+) Blood dyscrasia, anemia   Anesthesia Other Findings   Reproductive/Obstetrics negative OB ROS                             Anesthesia Physical Anesthesia Plan  ASA: 3  Anesthesia Plan: Spinal   Post-op Pain Management: Regional block* and Tylenol PO (pre-op)*   Induction: Intravenous  PONV Risk Score and Plan: 3 and Ondansetron, Dexamethasone and Treatment may vary due to age or medical condition  Airway Management Planned: Natural Airway and Simple Face Mask  Additional Equipment:   Intra-op Plan:   Post-operative Plan:   Informed Consent: I have reviewed the patients History and Physical, chart, labs and discussed the procedure including the risks, benefits and alternatives for the proposed anesthesia with the patient or authorized representative who has indicated his/her understanding and acceptance.     Dental advisory given  Plan Discussed with: CRNA  Anesthesia Plan Comments:        Anesthesia Quick Evaluation

## 2022-08-30 NOTE — Op Note (Unsigned)
NAME: Brandon Mendoza, Brandon Mendoza MEDICAL RECORD NO: [id number] ACCOUNT NO: [id number] DATE OF BIRTH: 04/25/1940 FACILITY: [facility] LOCATION: [facility] PHYSICIAN: Madlyn Frankel. Charlann Boxer, MD  Operative Report   DATE OF PROCEDURE: 08/30/2022  PREOPERATIVE DIAGNOSIS:  Re-rupture of a right quadriceps tendon repair.  POSTOPERATIVE DIAGNOSIS:  Re-rupture of a right quadriceps tendon repair.  PROCEDURE: 1.  Revision repair of a right quadriceps tendon using a Krakow suture weave pattern through bone tunnels. 2.  Application of long leg splint.  SURGEON:  Madlyn Frankel. Charlann Boxer, MD  ASSISTANT:  Rosalene Billings, PA-C.  Note that Ms. Domenic Schwab was present for the entirety of the case from preoperative positioning, perioperative management of the operative extremity, facilitation of the case with reapproximation of the quadriceps tendon  to bone as well as primary wound closure as well as assistance and application of the splint.  ANESTHESIA:  Regional femoral nerve block plus spinal block.  COMPLICATIONS:  None.  DRAINS:  None.  TOURNIQUET:  Up for 32 minutes at 225 mmHg.  INDICATIONS FOR THE PROCEDURE:  The patient is an 82 year old male now about 7 weeks since he had an acute rupture of his right quadriceps tendon.  He was taken to the operating room around that time and had a primary repair of his quadriceps tendon to  bone.  During the postoperative period, he was initially used with a knee immobilizer then to a Bledsoe brace.  However, it was evident that his early postoperative visit he was moving his knee with his knee slightly in a flexed position at the time of  his evaluation.  We were worried about potential re-rupture or laxity of the repair.  At this time, persistent he was not getting better and had a significant extensor lag of about 20-30 degrees.  I ordered an MRI, which confirmed suspicion of either  re-rupture versus widening of the gap of his quadriceps tendon to bone.  We discussed revision of his repair  and application of a splint and/or cast.  Though the challenges are known with his gait instability with his weakness in his knee is buckling  anyways.  This needed to occur to try to maximize his opportunity for weightbearing with full quad strength.  Consent was obtained after reviewing the risks for infection, DVT or re-rupture of this repair.  DESCRIPTION OF PROCEDURE:  The patient was brought to the operative theater.  Once adequate anesthesia, preoperative antibiotics, Ancef administered, he was positioned supine.  A right thigh tourniquet was placed.  The right lower extremity was then  prepped and draped in sterile fashion.  A timeout was performed identifying the patient, planned procedure, and extremity.  His old incision was excised and slightly elongated proximally and distal.  Soft tissue planes created.  At the time of his  primary repair, he was noted to have significant retinacular involvement in this tissue all appeared to be relatively healed.  He did not have an obvious re-avulsion of the tendon, however, there is definite palpable weakness and thinness of the tissue  just proximal to the patella.  I made an incision longitudinally just above the proximal pole of the patella extended into the medial and lateral retinacular structures for exposure purposes.  I then evaluated the mass tissue that had stretched and  formed in this area by pulling it into its appropriate anatomic position in relationship to a proximally retracting the patella.  I then excised this nonviable soft tissue including some of the suture that was in this area.  At  this point, I placed two  #2 FiberWire sutures in a Krakow weave pattern into the medial and lateral aspect of the tendon once drain was left lateral, one medial and two centrally.  We then recreated drill holes into the patella and then passed the sutures accordingly.  At this  point, we used a combination of a towel clip to apply proximal traction on the  patella as there was a downward inferior pressure traction on the quadriceps tendon to bone.  The lateral sutures were reapproximated first followed by the medial and then the  two together.  At this point, I used two #1 Vicryl sutures to reapproximate the retinacular tissue as well as the soft tissues of the quadriceps tendon over top of the this repair.  The longitudinal incisions that were made in the patella tendon for the  suture for reapproximation as well.  The wound was irrigated with normal saline solution.  We then reapproximated the subcutaneous layers with 2-0 Vicryl and a running Monocryl stitch.  The wound was then cleaned, dried and dressed sterilely using  surgical glue and Aquacel dressing.  At this point, we broke down the dressings and applied a long leg splint.  After planned cast padding a 6-inch plaster splint material was configured on to the posterior aspect of the knee and then wrapped in an Ace  wrap.  We did support this with the use of a knee immobilizer to provide medial and lateral restraints well.  He was then brought to the recovery room in stable condition, tolerating the procedure well.  Postoperatively, he will be very limited weightbearing due to the challenges of the splint.  We will work with physical therapy on transform abilities with his upper extremities and left lower extremity avoiding any activity with his right leg to allow  his quadriceps tendon to heal.   PUS D: 08/30/2022 3:32:44 pm T: 08/30/2022 3:49:00 pm  JOB: [id number]/ [id number]

## 2022-08-31 ENCOUNTER — Ambulatory Visit: Payer: Medicare Other | Admitting: Internal Medicine

## 2022-08-31 ENCOUNTER — Encounter (HOSPITAL_COMMUNITY): Payer: Self-pay | Admitting: Orthopedic Surgery

## 2022-08-31 DIAGNOSIS — S76111A Strain of right quadriceps muscle, fascia and tendon, initial encounter: Secondary | ICD-10-CM | POA: Diagnosis not present

## 2022-08-31 LAB — BASIC METABOLIC PANEL
Anion gap: 9 (ref 5–15)
BUN: 23 mg/dL (ref 8–23)
CO2: 23 mmol/L (ref 22–32)
Calcium: 8.6 mg/dL — ABNORMAL LOW (ref 8.9–10.3)
Chloride: 104 mmol/L (ref 98–111)
Creatinine, Ser: 1.01 mg/dL (ref 0.61–1.24)
GFR, Estimated: >60 mL/min (ref >60)
Glucose, Bld: 159 mg/dL — ABNORMAL HIGH (ref 70–99)
Potassium: 3.9 mmol/L (ref 3.5–5.1)
Sodium: 136 mmol/L (ref 135–145)

## 2022-08-31 LAB — CBC
HCT: 37.1 % — ABNORMAL LOW (ref 39.0–52.0)
Hemoglobin: 12.5 g/dL — ABNORMAL LOW (ref 13.0–17.0)
MCH: 30.7 pg (ref 26.0–34.0)
MCHC: 33.7 g/dL (ref 30.0–36.0)
MCV: 91.2 fL (ref 80.0–100.0)
Platelets: 228 10*3/uL (ref 150–400)
RBC: 4.07 MIL/uL — ABNORMAL LOW (ref 4.22–5.81)
RDW: 12.4 % (ref 11.5–15.5)
WBC: 8.6 10*3/uL (ref 4.0–10.5)
nRBC: 0 % (ref 0.0–0.2)

## 2022-08-31 MED ORDER — PROPOFOL 10 MG/ML IV BOLUS
INTRAVENOUS | Status: AC
  Filled 2022-08-31: qty 20

## 2022-08-31 NOTE — Evaluation (Signed)
Physical Therapy Evaluation Patient Details Name: Brandon Mendoza MRN: [id number] DOB: 1940-07-16 Today's Date: 08/31/2022  History of Present Illness  Pt is 82 yo male admitted on 08/30/22 with recurrent R quadriceps tendon rupture and is now s/p revision of R quadriceps tendon repair and application of long leg splint.  Pt initially with quad tendon rupture on 07/09/22 with repair on 07/12/22.  Pt with hx including but not limited to COPD, macular degeneration, spinal stenosis, lumbar fusion, CVA, CKD, afib, neuropathy  Clinical Impression  Pt admitted with above diagnosis. At baseline, pt is independent.  Since his recent injury he was at [facility] working with therapy.  Pt reports good pain control throughout his injury.  Today, he required min A bed mobility, mod A to stand, and min A to pivot to chair.  He is now NWB and in long leg splint and KI - cues for NWB with transfers.  With NWB and leg straight pt will be limited in ability to ambulate with heavy focus on transfers.  Do continue to recommend continued therapy at d/c as pt lives at home with elderly wife who is unable to assist.  Pt currently with functional limitations due to the deficits listed below (see PT Problem List). Pt will benefit from acute skilled PT to increase their independence and safety with mobility to allow discharge.          Recommendations for follow up therapy are one component of a multi-disciplinary discharge planning process, led by the attending physician.  Recommendations may be updated based on patient status, additional functional criteria and insurance authorization.  Follow Up Recommendations       Assistance Recommended at Discharge Frequent or constant Supervision/Assistance  Patient can return home with the following  A lot of help with walking and/or transfers;A lot of help with bathing/dressing/bathroom;Assistance with cooking/housework;Help with stairs or ramp for entrance    Equipment Recommendations  Rolling walker (2 wheels);Wheelchair cushion (measurements PT);BSC/3in1;Hospital bed;Wheelchair (measurements PT) (Drop arm on w/c and BSC; W/c with elevating leg rest; ramp)  Recommendations for Other Services       Functional Status Assessment Patient has had a recent decline in their functional status and demonstrates the ability to make significant improvements in function in a reasonable and predictable amount of time.     Precautions / Restrictions Precautions Precautions: Fall Required Braces or Orthoses: Splint/Cast;Knee Immobilizer - Right Knee Immobilizer - Right: On at all times Splint/Cast: R long leg splint applied 08/30/22 during sx Restrictions Weight Bearing Restrictions: Yes RLE Weight Bearing: Non weight bearing      Mobility  Bed Mobility Overal bed mobility: Needs Assistance Bed Mobility: Supine to Sit     Supine to sit: Min assist     General bed mobility comments: Min A for R LE;  cues for scooting forward    Transfers Overall transfer level: Needs assistance Equipment used: Rolling walker (2 wheels) Transfers: Sit to/from Stand, Bed to chair/wheelchair/BSC Sit to Stand: From elevated surface, +2 safety/equipment, Mod assist Stand pivot transfers: Min assist, +2 safety/equipment         General transfer comment: Pt stood from bed and from recliner.  Required mod A to rise and steady wtih cues for hand placement and NWB.  Pt performed stand pivot with min A to steady and control RW- pivoted toward L side.  Pt requiring min cues and assist for NWB on R LE    Ambulation/Gait  General Gait Details: deferred due to ability to maintain NWB  Stairs            Wheelchair Mobility    Modified Rankin (Stroke Patients Only)       Balance Overall balance assessment: Needs assistance Sitting-balance support: No upper extremity supported Sitting balance-Leahy Scale: Good     Standing balance support: Bilateral upper extremity  supported, Reliant on assistive device for balance Standing balance-Leahy Scale: Poor Standing balance comment: RW and min a                             Pertinent Vitals/Pain Pain Assessment Pain Assessment: 0-10 Pain Score: 1  Pain Location: R leg Pain Descriptors / Indicators: Discomfort Pain Intervention(s): Limited activity within patient's tolerance, Monitored during session    Home Living Family/patient expects to be discharged to:: Skilled nursing facility (From home but recently at SNF due to injury) Living Arrangements: Spouse/significant other Available Help at Discharge: Family;Available 24 hours/day Type of Home: House Home Access: Stairs to enter Entrance Stairs-Rails: Right;Left Entrance Stairs-Number of Steps: 4 Alternate Level Stairs-Number of Steps: flight Home Layout: Two level;Able to live on main level with bedroom/bathroom Home Equipment: Rolling Walker (2 wheels);Shower seat;Grab bars - tub/shower;BSC/3in1      Prior Function Prior Level of Function : Needs assist             Mobility Comments: Pt fell early April and injured knee and has been at [facility] since.  Prior to fall pt was independent and could ambulate in community.  He occasionally using hiking pole.  Reports 3 year hx of LE fatigue/collapse causing fall. While at SNF, pt has been working with rehab and walking some with RW but was WBAT at that time. ADLs Comments: Independent prior to injury     Hand Dominance   Dominant Hand: Right    Extremity/Trunk Assessment   Upper Extremity Assessment Upper Extremity Assessment: Overall WFL for tasks assessed    Lower Extremity Assessment Lower Extremity Assessment: LLE deficits/detail;RLE deficits/detail RLE Deficits / Details: Able to move toes, in long leg splint.  Hip ROM WFL; MMT: hip 2/5 not further tested LLE Deficits / Details: ROM WFL; MMT 5/5 (but pt does report hx of collapsing)    Cervical / Trunk Assessment Cervical /  Trunk Assessment: Kyphotic  Communication   Communication: No difficulties  Cognition Arousal/Alertness: Awake/alert Behavior During Therapy: WFL for tasks assessed/performed Overall Cognitive Status: Within Functional Limits for tasks assessed                                          General Comments      Exercises     Assessment/Plan    PT Assessment Patient needs continued PT services  PT Problem List Decreased strength;Decreased range of motion;Decreased activity tolerance;Decreased knowledge of use of DME;Decreased balance;Decreased mobility;Decreased knowledge of precautions;Decreased safety awareness       PT Treatment Interventions DME instruction;Therapeutic exercise;Wheelchair mobility training;Balance training;Gait training;Stair training;Modalities;Functional mobility training;Therapeutic activities;Patient/family education    PT Goals (Current goals can be found in the Care Plan section)  Acute Rehab PT Goals Patient Stated Goal: heal R LE PT Goal Formulation: With patient Time For Goal Achievement: 09/14/22 Potential to Achieve Goals: Good Additional Goals Additional Goal #1: Pt will be able to mobilize 150' in w/c with supervision  Frequency Min 1X/week     Co-evaluation               AM-PAC PT "6 Clicks" Mobility  Outcome Measure Help needed turning from your back to your side while in a flat bed without using bedrails?: A Little Help needed moving from lying on your back to sitting on the side of a flat bed without using bedrails?: A Little Help needed moving to and from a bed to a chair (including a wheelchair)?: A Lot Help needed standing up from a chair using your arms (e.g., wheelchair or bedside chair)?: A Lot Help needed to walk in hospital room?: Total Help needed climbing 3-5 steps with a railing? : Total 6 Click Score: 12    End of Session Equipment Utilized During Treatment: Gait belt;Right knee  immobilizer Activity Tolerance: Patient tolerated treatment well Patient left: with chair alarm set;in chair;with call bell/phone within reach Nurse Communication: Mobility status PT Visit Diagnosis: Other abnormalities of gait and mobility (R26.89);Muscle weakness (generalized) (M62.81)    Time: 1610-9604 PT Time Calculation (min) (ACUTE ONLY): 31 min   Charges:   PT Evaluation $PT Eval Low Complexity: 1 Low PT Treatments $Therapeutic Activity: 8-22 mins        Anise Salvo, PT Acute Rehab [facility] [phone]   Rayetta Humphrey 08/31/2022, 4:07 PM

## 2022-08-31 NOTE — Progress Notes (Signed)
Subjective: 1 Day Post-Op Procedure(s) (LRB): REPAIR QUADRICEP TENDON (Right) Patient reports pain as mild.   Patient seen in rounds with Dr. Charlann Boxer. Patient is well, and has had no acute complaints or problems. Dr. Charlann Boxer had a long discussion with him about his injury, intra-op findings, and post-op expectations. He does prefer to go to [facility] after discharge.  We will start therapy today.   Objective: Vital signs in last 24 hours: Temp:  [97.5 F (36.4 C)-98 F (36.7 C)] 97.5 F (36.4 C) (06/05 0607) Pulse Rate:  [64-88] 80 (06/05 0607) Resp:  [14-21] 16 (06/05 0607) BP: (124-162)/(56-87) 145/72 (06/05 0607) SpO2:  [93 %-100 %] 96 % (06/05 0607) Weight:  [81.6 kg] 81.6 kg (06/04 1112)  Intake/Output from previous day:  Intake/Output Summary (Last 24 hours) at 08/31/2022 0731 Last data filed at 08/31/2022 0600 Gross per 24 hour  Intake 1440 ml  Output 2200 ml  Net -760 ml     Intake/Output this shift: No intake/output data recorded.  Labs: Recent Labs    08/31/22 0328  HGB 12.5*   Recent Labs    08/31/22 0328  WBC 8.6  RBC 4.07*  HCT 37.1*  PLT 228   Recent Labs    08/31/22 0328  NA 136  K 3.9  CL 104  CO2 23  BUN 23  CREATININE 1.01  GLUCOSE 159*  CALCIUM 8.6*   No results for input(s): "LABPT", "INR" in the last 72 hours.  Exam: General - Patient is Alert and Oriented Extremity - Neurologically intact Sensation intact distally Intact pulses distally Dorsiflexion/Plantar flexion intact Dressing - dressing C/D/I Motor Function - intact, moving foot and toes well on exam.   Past Medical History:  Diagnosis Date   Acquired clawfoot, right foot 12/16/2021   Anxiety    Atrophy of calf muscles 10/21/2021   Duplex calves was negative for significant blockage EMG was done at [facility] MRI lumbar spine was also done at [facility]   Carotid artery occlusion    CKD (chronic kidney disease)    Stage 3   COPD (chronic obstructive pulmonary  disease) (HCC)    Depression    Dysrhythmia    PAF, atrial tachycardia   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   The spinal cord appears normal. 2.   No spinal stenosis. 3.   Mild multilevel degenerative changes as detailed above that do not lead to spinal stenosis or nerve root compression. 4.   T2 hyperintense foci within the pons consistent with chronic microvascular ischemic changes.   Loop - Medtronic Linq 10/22/2020 10/22/2020   Nocturia    Paroxysmal atrial fibrillation (HCC)    Peripheral neuropathy    Peripheral vascular disease (HCC)    Sleep apnea    on 6 cm, nasal pillow   Spinal stenosis    Stroke (HCC) 05/01/2011   ischemic   Weakness generalized 10/21/2021   Severe, legs and arms    Assessment/Plan: 1 Day Post-Op Procedure(s) (LRB): REPAIR QUADRICEP TENDON (Right) Principal Problem:   Quadriceps tendon rupture, right, subsequent encounter  Estimated body mass index is 26.58 kg/m as calculated from the following:   Height as of this encounter: 5\' 9"  (1.753 m).  Weight as of this encounter: 81.6 kg. Advance diet Up with therapy    DVT Prophylaxis - Aspirin NWB RLE Splint & Knee immobilizer for support.  Hgb stable at 12.5 this AM.  Will get SNF placement. Hopeful discharge by end of week -- once meeting goals with PT and bed placement obtained.    Dennie Bible, PA-C Orthopedic Surgery [phone] 08/31/2022, 7:31 AM

## 2022-09-01 DIAGNOSIS — S76111A Strain of right quadriceps muscle, fascia and tendon, initial encounter: Secondary | ICD-10-CM | POA: Diagnosis not present

## 2022-09-01 LAB — CBC
HCT: 38.8 % — ABNORMAL LOW (ref 39.0–52.0)
Hemoglobin: 12.9 g/dL — ABNORMAL LOW (ref 13.0–17.0)
MCH: 30.8 pg (ref 26.0–34.0)
MCHC: 33.2 g/dL (ref 30.0–36.0)
MCV: 92.6 fL (ref 80.0–100.0)
Platelets: 257 10*3/uL (ref 150–400)
RBC: 4.19 MIL/uL — ABNORMAL LOW (ref 4.22–5.81)
RDW: 13 % (ref 11.5–15.5)
WBC: 9.6 10*3/uL (ref 4.0–10.5)
nRBC: 0 % (ref 0.0–0.2)

## 2022-09-01 NOTE — TOC Initial Note (Addendum)
Transition of Care [facility]) - Initial/Assessment Note    Patient Details  Name: Brandon Mendoza MRN: [id number] Date of Birth: 1940-10-02  [facility]) CM/SW Contact:    Amada Jupiter, LCSW Phone Number: 09/01/2022, 9:50 AM  Clinical Narrative:                 Met yesterday with pt to discuss dc planning issues.  Pt confirms that, following prior surgery, he was discharged to SNF [facility]).  He was admitted here for surgery directly from SNF [facility]) and states his plan is to return to this SNF.  Have discussed with pt that he is admitted here under Observation status and that insurance will not cover return to SNF.  Pt very frustrated and upset with this.  Attempted to explain need to meet certain medical need criteria in order to be placed under Inpatient states but pt increasingly upset.  Reached out to UR dept who will follow up with pt to offer additional information.   At this point, MD/PA aware of this as well.   Pt will concerns that his wife would not be able to provide currently level of physical support he needs.  Will continue to work with therapy here and will continue to consider readiness for a home discharge.   ADDENDUM:  Pt has been able to speak withTOC supervisor and receive clarification on OBS status.  He agrees that plan at this point will be dc home, however, he does not feel he is ready for this yet.  Discussing needed DME for home that would make for a safer home dc.  Continue to follow.   Expected Discharge Plan: Home w Home Health Services (vs. SNF) Barriers to Discharge: Continued Medical Work up   Patient Goals and CMS Choice Patient states their goals for this hospitalization and ongoing recovery are:: pt prefers to dc to SNF, however, no ins coverage as pt is here under observation          Expected Discharge Plan and Services In-house Referral: Clinical Social Work     Living arrangements for the past 2 months: Skilled  Nursing Facility                                      Prior Living Arrangements/Services Living arrangements for the past 2 months: Skilled Nursing Facility Lives with:: Facility Resident Patient language and need for interpreter reviewed:: Yes Do you feel safe going back to the place where you live?: No   pt feels SNF is safest dc plan- does not feel his wife can provide needed assistance in the home  Need for Family Participation in Patient Care: Yes (Comment) Care giver support system in place?: Yes (comment)   Criminal Activity/Legal Involvement Pertinent to Current Situation/Hospitalization: No - Comment as needed  Activities of Daily Living Home Assistive Devices/Equipment: Environmental consultant (specify type), Shower chair with back, Wheelchair, Eyeglasses (rolling walker, reading glasses) ADL Screening (condition at time of admission) Patient's cognitive ability adequate to safely complete daily activities?: Yes Is the patient deaf or have difficulty hearing?: No Does the patient have difficulty seeing, even when wearing glasses/contacts?: No Does the patient have difficulty concentrating, remembering, or making decisions?: No Patient able to express need for assistance with ADLs?: Yes Does the patient have difficulty dressing or bathing?: Yes Independently performs ADLs?: No Communication: Independent Dressing (OT): Needs assistance Is this a  change from baseline?: Pre-admission baseline Grooming: Independent Feeding: Independent Bathing: Needs assistance Is this a change from baseline?: Pre-admission baseline Toileting: Needs assistance Is this a change from baseline?: Pre-admission baseline In/Out Bed: Needs assistance Is this a change from baseline?: Pre-admission baseline Walks in Home: Needs assistance Is this a change from baseline?: Pre-admission baseline Does the patient have difficulty walking or climbing stairs?: Yes Weakness of Legs: Right Weakness of Arms/Hands:  None  Permission Sought/Granted Permission sought to share information with : Family Supports Permission granted to share information with : Yes, Verbal Permission Granted  Share Information with NAME: spouse, Bond Goldfine @ [phone]           Emotional Assessment Appearance:: Appears stated age Attitude/Demeanor/Rapport: Reactive Affect (typically observed): Frustrated Orientation: : Oriented to Self, Oriented to Place, Oriented to Situation, Oriented to  Time Alcohol / Substance Use: Not Applicable Psych Involvement: No (comment)  Admission diagnosis:  Quadriceps tendon rupture, right, subsequent encounter [S76.111D] Patient Active Problem List   Diagnosis Date Noted   Quadriceps tendon rupture, right, subsequent encounter 08/30/2022   Quadriceps tendon rupture, right, initial encounter 07/12/2022   Unable to care for self 07/08/2022   Dehydration 07/08/2022   Depression 07/08/2022   COPD (chronic obstructive pulmonary disease) (HCC) 07/08/2022   Macular degeneration 03/01/2022   Myofascial pain 02/10/2022   Lumbar spondylosis 01/03/2022   Acquired clawfoot, left foot 12/16/2021   Acquired clawfoot, right foot 12/16/2021   Claw foot 12/14/2021   Gait abnormality 12/10/2021   Spinal stenosis at L4-L5 level 12/10/2021   Ataxia 11/22/2021   Lightheadedness 11/02/2021   Claudication of lower extremity (HCC) 10/21/2021   Atrophy of calf muscles 10/21/2021   Weakness generalized 10/21/2021   Focal neurological deficit 10/21/2021   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   Pain in joint of right hip 09/14/2021   High arches 07/22/2021   Hammertoes of both feet 07/22/2021   Neuropathy 07/22/2021   Constipation 05/28/2021   Disequilibrium 05/28/2021   Personal history of colonic polyps 05/28/2021   Renal cyst 05/28/2021   Gastroesophageal reflux disease without esophagitis 05/05/2021   Ingrowing left great toenail 02/15/2021   S/P lumbar  fusion 12/24/2020   Spinal stenosis of lumbar region 12/04/2020   Acquired complex renal cyst 11/23/2020   Functional gait abnormality 11/04/2020   Idiopathic neuropathy 11/02/2020   Tinea pedis of both feet 10/28/2020   Loop - Medtronic Linq 10/22/2020 10/22/2020   Benign hypertension with 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) 10/05/2020   Fall with injury 10/05/2020   Hematoma 09/16/2020   Normocytic anemia 09/14/2020   Leukocytosis 09/14/2020   AF (paroxysmal atrial fibrillation) (HCC) 09/14/2020   Chronic anticoagulation 09/14/2020   Hyponatremia 09/14/2020   Pain in left foot 01/22/2020   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   Pain in joint of right knee 12/27/2019   Recurrent falls 11/17/2019   Paroxysmal atrial fibrillation (HCC) 10/22/2019   Pain in right foot 10/14/2019   Falls 10/07/2019   Ataxia involving legs 08/12/2019   OSA on CPAP 06/20/2018   Urge incontinence 04/13/2018   Urinary urgency 04/13/2018   Pain in left knee 03/01/2018   Other intervertebral disc degeneration, lumbar region 06/16/2017   Acquired bilateral hammer toes 10/05/2015   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   Generalized anxiety disorder 05/12/2015   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 [place] [address] Phone: [phone] Fax: [phone]  [id number] - [place], [place] - [address] AT [facility]  [address] [place] [address] Phone: [phone] Fax: [phone]     Social Determinants of Health (SDOH) Social History: SDOH Screenings   Food Insecurity: No Food Insecurity (08/30/2022)  Housing: Low Risk  (08/30/2022)  Transportation Needs: No Transportation Needs (08/30/2022)  Utilities: Not At Risk (08/30/2022)  Depression (PHQ2-9): Low Risk  (12/10/2021)  Tobacco Use: Medium Risk (08/31/2022)   SDOH Interventions:     Readmission Risk Interventions    07/09/2022    1:44 PM  Readmission Risk Prevention Plan  Transportation Screening Complete  PCP or Specialist Appt within 5-7 Days Complete  Home Care Screening Complete  Medication Review (RN CM) Complete

## 2022-09-01 NOTE — Evaluation (Signed)
Occupational Therapy Evaluation Patient Details Name: Brandon Mendoza MRN: [id number] DOB: [birth year] Today's Date: 09/01/2022   History of Present Illness Pt is a 82 yr old male brought to the hospital 08/30/2022 w/ recurrent R quadriceps tendon rupture now s/p revision of R quadriceps tendon repair & application of long leg splint. Pt had initial R quad tendon repair in mid April 2024. PMH: COPD, macular degeneration, spinal stenosis, lumbar fusion, CVA, CKD, afib, neuropathy   Clinical Impression      The pt is currently presenting well below his baseline level of functioning for self-care management, given new RLE NWB status, R knee ROM limitations due to applied brace/splint, impaired functional mobility, post-op pain, and impaired standing balance. He reported a history of falls, due to LLE weakness and instability. He currently requires increased assist for tasks, such as lower body dressing, sit to stand, and toileting. He desires to return to short term SNF, once discharged from the hospital, given his current functional limitations and concerns about his ability to safely perform functional activities in his home (sub-optimal home layout); OT is in agreement that post-acute care SNF rehab is warranted. Without further OT services, he is at high risk for additional falls and restricted participation in meaningful activities.    Recommendations for follow up therapy are one component of a multi-disciplinary discharge planning process, led by the attending physician.  Recommendations may be updated based on patient status, additional functional criteria and insurance authorization.   Assistance Recommended at Discharge Frequent or constant Supervision/Assistance  Patient can return home with the following Help with stairs or ramp for entrance;Assistance with cooking/housework;A lot of help with bathing/dressing/bathroom;A lot of help with walking and/or transfers;Assist for transportation     Functional Status Assessment  Patient has had a recent decline in their functional status and demonstrates the ability to make significant improvements in function in a reasonable and predictable amount of time.  Equipment Recommendations  None recommended by OT       Precautions / Restrictions Precautions Precautions: Fall Required Braces or Orthoses: Splint/Cast;Knee Immobilizer - Right Knee Immobilizer - Right: On at all times Splint/Cast: R long leg splint applied 08/30/22 during sx Restrictions Weight Bearing Restrictions: Yes RLE Weight Bearing: Non weight bearing      Mobility Bed Mobility      General bed mobility comments: pt was received seated in bedside chair    Transfers Overall transfer level: Needs assistance Equipment used: Rolling walker (2 wheels) Transfers: Sit to/from Stand Sit to Stand: Mod assist                  Balance Overall balance assessment: Needs assistance   Sitting balance-Leahy Scale: Good       Standing balance-Leahy Scale: Poor             ADL either performed or assessed with clinical judgement   ADL Overall ADL's : Needs assistance/impaired Eating/Feeding: Independent;Sitting   Grooming: Set up;Sitting           Upper Body Dressing : Set up;Sitting   Lower Body Dressing: Moderate assistance;Sit to/from stand;With adaptive equipment Lower Body Dressing Details (indicate cue type and reason): OT instructed the pt on the use of a reacher for lower body dressing, specifically for donning bottoms such as underwear and shorts.               General ADL Comments: Pt presented with multiple questions and concerns regarding his ability to perform self care tasks in his  home, given his current functional limitations and his household layout. OT subsequently provided education and/or recommendations as pertains to such thing as: safety during toileting tasks, options for toileting management, use of AE/DME during  self-care tasks (such as BSC, and toilet bidet or toilet tongs for hygiene management), falls risk reduction tips, and performing lower body dressing.     Vision   Additional Comments: he correctly read the time depicted on the wall clock            Pertinent Vitals/Pain Pain Assessment Pain Assessment: 0-10 Pain Score: 3  Pain Location: R knee with actiivty Pain Intervention(s): Limited activity within patient's tolerance, Monitored during session     Hand Dominance     Extremity/Trunk Assessment Upper Extremity Assessment Upper Extremity Assessment: Overall WFL for tasks assessed   Lower Extremity Assessment RLE Deficits / Details: Knee immobilizer applied LLE Deficits / Details:  (AROM and strength WFL)       Communication Communication Communication: No difficulties   Cognition Arousal/Alertness: Awake/alert Behavior During Therapy: WFL for tasks assessed/performed Overall Cognitive Status: Within Functional Limits for tasks assessed            General Comments: Oriented x4, able to follow commands                Home Living Family/patient expects to be discharged to:: Private residence Living Arrangements: Spouse/significant other Available Help at Discharge: Family Type of Home: House Home Access: Stairs to enter Secretary/administrator of Steps: 4   Home Layout: Multi-level;Full bath on main level Alternate Level Stairs-Number of Steps: 3 story home including a basement level   Bathroom Shower/Tub: Walk-in shower         Home Equipment: Agricultural consultant (2 wheels);Shower seat;Grab bars - tub/shower;BSC/3in1;Rollator (4 wheels)          Prior Functioning/Environment      Mobility Comments: Pt fell early April and injured knee and has been at [facility] since.  Prior to fall pt was independent and could ambulate in community.  He occasionally using hiking pole.  Reports 3 year hx of LE fatigue/collapse causing fall. While at SNF, pt has been working  with rehab and walking some with RW but was WBAT at that time. ADLs Comments: Independent with ADLs, driving, and sharing household chores with his spouse prior to injury.        OT Problem List: Decreased strength;Decreased range of motion;Impaired balance (sitting and/or standing);Decreased knowledge of use of DME or AE;Pain      OT Treatment/Interventions: Self-care/ADL training;Therapeutic exercise;Energy conservation;DME and/or AE instruction;Therapeutic activities;Balance training;Patient/family education    OT Goals(Current goals can be found in the care plan section) Acute Rehab OT Goals Patient Stated Goal: to return to short term SNF rehab OT Goal Formulation: With patient Time For Goal Achievement: 09/15/22 Potential to Achieve Goals: Good ADL Goals Pt Will Perform Lower Body Dressing: with supervision;with adaptive equipment;sit to/from stand Pt Will Transfer to Toilet: with supervision;bedside commode;stand pivot transfer Pt Will Perform Toileting - Clothing Manipulation and hygiene: with supervision;sit to/from stand  OT Frequency: Min 1X/week       AM-PAC OT "6 Clicks" Daily Activity     Outcome Measure Help from another person eating meals?: None Help from another person taking care of personal grooming?: None   Help from another person bathing (including washing, rinsing, drying)?: A Lot Help from another person to put on and taking off regular upper body clothing?: None Help from another person to put on and  taking off regular lower body clothing?: A Lot 6 Click Score: 16   End of Session Equipment Utilized During Treatment: Gait belt;Rolling walker (2 wheels) Nurse Communication: Mobility status  Activity Tolerance: Patient tolerated treatment well Patient left: in chair;with call bell/phone within reach;with chair alarm set  OT Visit Diagnosis: Unsteadiness on feet (R26.81);History of falling (Z91.81);Muscle weakness (generalized) (M62.81)                 Time: 1220-1250 OT Time Calculation (min): 30 min Charges:  OT General Charges $OT Visit: 1 Visit OT Evaluation $OT Eval Moderate Complexity: 1 Mod OT Treatments $Self Care/Home Management : 8-22 mins   Reuben Likes, OTR/L 09/01/2022, 1:53 PM

## 2022-09-01 NOTE — Progress Notes (Signed)
Physical Therapy Treatment Patient Details Name: Brandon Mendoza MRN: [id number] DOB: November 29, 1940 Today's Date: 09/01/2022   History of Present Illness Pt is 82 yo male admitted on 08/30/22 with recurrent R quadriceps tendon rupture and is now s/p revision of R quadriceps tendon repair and application of long leg splint.  Pt initially with quad tendon rupture on 07/09/22 with repair on 07/12/22.  Pt with hx including but not limited to COPD, macular degeneration, spinal stenosis, lumbar fusion, CVA, CKD, afib, neuropathy    PT Comments    POD # 2 pm session General transfer comment: assisted out of recliner requires ModAssist.  ModMax Assist for turns/pivot mainly due to NWB R LE. Pt lacks balance/coordination as well as overall weakness/deconditioning esp B UE's.  "What do you expect!  I'm 82 years old".  Pt jokingly stated.  He is also so fearful he is going to "damage" re injure that knee if he does put weight on it.  Then assisted with a transfer in/out of his powesonal wheelchair.  Than assisted with a tranfers from wheelchair back to bed.  Increased fatigue with each activity. General Gait Details: Applied RIGHT shoe.  Pulled recliner into hallway for more clearance.  Attempted assisting pt with amb at NWB "hopping" is very difficult for pt due to weakness esp B UE's to support his body weight.  Amb 4 feet then another 3 feet with LOB x 4 Therapist recovered using safety belt.  NOT SAFE TO AMB ON HIS OWN.  HIGH FALL RISK.  Performed wheelchair mobility in hallway > 200 feet using B UE's.  Then assisted back to bed per pt request to rest. Pt is NOT safe with transfers and esp unsteady with mobility.  Pt will need ST Rehab at SNF to address mobility and functional decline prior to safely returning home.   Recommendations for follow up therapy are one component of a multi-disciplinary discharge planning process, led by the attending physician.  Recommendations may be updated based on patient status,  additional functional criteria and insurance authorization.  Follow Up Recommendations       Assistance Recommended at Discharge Frequent or constant Supervision/Assistance  Patient can return home with the following A lot of help with walking and/or transfers;A lot of help with bathing/dressing/bathroom;Assistance with cooking/housework;Help with stairs or ramp for entrance   Equipment Recommendations  Rolling walker (2 wheels);Wheelchair cushion (measurements PT);BSC/3in1;Hospital bed;Wheelchair (measurements PT)    Recommendations for Other Services       Precautions / Restrictions Precautions Precautions: Fall Required Braces or Orthoses: Splint/Cast;Knee Immobilizer - Right Knee Immobilizer - Right: On at all times Splint/Cast: R long leg splint applied 08/30/22 during sx Restrictions Weight Bearing Restrictions: Yes RLE Weight Bearing: Non weight bearing     Mobility  Bed Mobility Overal bed mobility: Needs Assistance Bed Mobility: Sit to Supine     Supine to sit: Min guard, Min assist Sit to supine: Min guard, Min assist   General bed mobility comments: assisted back to bed    Transfers Overall transfer level: Needs assistance Equipment used: Rolling walker (2 wheels) Transfers: Sit to/from Stand Sit to Stand: From elevated surface, Mod assist Stand pivot transfers: Mod assist         General transfer comment: assisted out of recliner requires ModAssist.  ModMax Assist for turns/pivot mainly due to NWB R LE. Pt lacks balance/coordination as well as overall weakness/deconditioning esp B UE's.  "What do you expect!  I'm 82 years old".  Pt jokingly stated.  He is also so fearful he is going to "damage" re injure that knee if he does put weight on it.  Then assisted with a transfer in/out of his powesonal wheelchair.  Than assisted with a tranfers from wheelchair back to bed.  Increased fatigue with each activity.    Ambulation/Gait Ambulation/Gait assistance: Max  assist, Total assist Gait Distance (Feet): 7 Feet Assistive device: Rolling walker (2 wheels) Gait Pattern/deviations: Step-to pattern, Decreased stance time - left Gait velocity: decreased     General Gait Details: Applied RIGHT shoe.  Pulled recliner into hallway for more clearance.  Attempted assisting pt with amb at NWB "hopping" is very difficult for pt due to weakness esp B UE's to support his body weight.  Amb 4 feet then another 3 feet with LOB x 4 Therapist recovered using safety belt.  NOT SAFE TO AMB ON HIS OWN.  HIGH FALL RISK.   Stairs             [employer]chant [employer] officer mobility: Yes Wheelchair propulsion: Both upper extremities Wheelchair parts: Supervision/cueing Distance: Acupuncturist Details (indicate cue type and reason): pt's personal wheelchair  Modified Rankin (Stroke Patients Only)       Balance                                            Cognition Arousal/Alertness: Awake/alert Behavior During Therapy: WFL for tasks assessed/performed Overall Cognitive Status: Within Functional Limits for tasks assessed                                 General Comments: Oriented x4, able to follow commands        Exercises      General Comments        Pertinent Vitals/Pain Pain Assessment Pain Assessment: Faces Faces Pain Scale: Hurts a little bit Pain Location: R knee with actiivty Pain Descriptors / Indicators: Operative site guarding, Discomfort, Grimacing Pain Intervention(s): Monitored during session, Repositioned    Home Living                          Prior Function            PT Goals (current goals can now be found in the care plan section) Progress towards PT goals: Progressing toward goals    Frequency    Min 1X/week      PT Plan Current plan remains appropriate    Co-evaluation              AM-PAC PT "6 Clicks" Mobility    Outcome Measure  Help needed turning from your back to your side while in a flat bed without using bedrails?: A Lot Help needed moving from lying on your back to sitting on the side of a flat bed without using bedrails?: A Lot Help needed moving to and from a bed to a chair (including a wheelchair)?: A Lot Help needed standing up from a chair using your arms (e.g., wheelchair or bedside chair)?: A Lot Help needed to walk in hospital room?: A Lot Help needed climbing 3-5 steps with a railing? : Total 6 Click Score: 11    End of Session Equipment Utilized During Treatment: Gait belt;Right knee immobilizer Activity Tolerance: Patient limited by fatigue Patient  left: in bed;with bed alarm set;with call bell/phone within reach Nurse Communication: Mobility status PT Visit Diagnosis: Other abnormalities of gait and mobility (R26.89);Muscle weakness (generalized) (M62.81)     Time: 1914-7829 PT Time Calculation (min) (ACUTE ONLY): 48 min  Charges:  $Gait Training: 8-22 mins $Therapeutic Activity: 8-22 mins $Wheel Chair Management: 8-22 mins                     Felecia Shelling  PTA Acute  Rehabilitation Services Office M-F          [phone]

## 2022-09-01 NOTE — Progress Notes (Signed)
Subjective: 2 Days Post-Op Procedure(s) (LRB): REPAIR QUADRICEP TENDON (Right) Patient reports pain as mild.   Patient seen in rounds for Dr. Charlann Boxer. Patient is well, and has had no acute complaints or problems. No acute events overnight. He ambulated well with PT. We discussed his concerns about whether it would hurt the knee to touch it or put ice on it, which it will not. He is somewhat frustrated about the admission details which are prohibiting him from SNF rehab.  We will continue therapy today.   Objective: Vital signs in last 24 hours: Temp:  [97.5 F (36.4 C)-99.2 F (37.3 C)] 97.5 F (36.4 C) (06/06 0628) Pulse Rate:  [67-76] 67 (06/06 0628) Resp:  [16-17] 16 (06/06 0628) BP: (130-152)/(60-75) 152/68 (06/06 0628) SpO2:  [93 %-99 %] 99 % (06/06 0628)  Intake/Output from previous day:  Intake/Output Summary (Last 24 hours) at 09/01/2022 0708 Last data filed at 09/01/2022 0645 Gross per 24 hour  Intake 720 ml  Output 2200 ml  Net -1480 ml     Intake/Output this shift: No intake/output data recorded.  Labs: Recent Labs    08/31/22 0328  HGB 12.5*   Recent Labs    08/31/22 0328  WBC 8.6  RBC 4.07*  HCT 37.1*  PLT 228   Recent Labs    08/31/22 0328  NA 136  K 3.9  CL 104  CO2 23  BUN 23  CREATININE 1.01  GLUCOSE 159*  CALCIUM 8.6*   No results for input(s): "LABPT", "INR" in the last 72 hours.  Exam: General - Patient is Alert and Oriented Extremity - Neurologically intact Sensation intact distally Intact pulses distally Dorsiflexion/Plantar flexion intact Dressing - dressing C/D/I Motor Function - intact, moving foot and toes well on exam.   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   The spinal cord appears normal. 2.   No spinal stenosis. 3.   Mild multilevel degenerative changes as detailed above that do not lead to spinal stenosis or nerve root compression. 4.   T2 hyperintense foci within the pons consistent with chronic microvascular ischemic changes.   Loop - [employer] Linq 10/22/2020 10/22/2020   Nocturia    Paroxysmal atrial fibrillation (HCC)    Peripheral neuropathy    Peripheral vascular disease (HCC)    Sleep apnea    on 6 cm, nasal pillow   Spinal stenosis    Stroke (HCC) 05/01/2011   ischemic   Weakness generalized 10/21/2021   Severe, legs and arms    Assessment/Plan: 2 Days Post-Op Procedure(s) (LRB): REPAIR QUADRICEP TENDON (Right) Principal Problem:   Quadriceps tendon rupture, right, subsequent encounter  Estimated body mass index is 26.58 kg/m as calculated from the  following:   Height as of this encounter: 5\' 9"  (1.753 m).   Weight as of this encounter: 81.6 kg. Advance diet Up with therapy   DVT Prophylaxis - Aspirin NWB RLE  Knee immobilizer when up  Continue working with PT here.  He does not have enough help at home to safely discharge there in his current state with NWB status.   If he is unable to go to SNF he will have to go home with significant outside assistance, but we have not discussed this yet.   Dennie Bible, PA-C Orthopedic Surgery [phone] 09/01/2022, 7:08 AM

## 2022-09-01 NOTE — Progress Notes (Signed)
Physical Therapy Treatment Patient Details Name: Brandon Mendoza MRN: [id number] DOB: August 31, 1940 Today's Date: 09/01/2022   History of Present Illness Pt is 82 yo male admitted on 08/30/22 with recurrent R quadriceps tendon rupture and is now s/p revision of R quadriceps tendon repair and application of long leg splint.  Pt initially with quad tendon rupture on 07/09/22 with repair on 07/12/22.  Pt with hx including but not limited to COPD, macular degeneration, spinal stenosis, lumbar fusion, CVA, CKD, afib, neuropathy    PT Comments    POD # 2 am session General Comments: AxO X 3 pleasant and motivated but frustrated with discharge disposition/situtaion.  Pt from [facility] for ST Rehab from first surgery.  Assisted OOB to amb was difficult.  General bed mobility comments: demonstarted and instructed how to use a belt to self guide LE off bed.  Required increased time and effort. General transfer comment: Pt stood from bed and from recliner.  Required mod A to rise and steady wtih cues for hand placement and NWB.  Pt performed stand pivot with Mod A to steady and control RW- pivoted toward L side.  Pt requiring min cues and assist for NWB on R LE  General Gait Details: Applied RIGHT shoe.  First, demonstarted proper tech to amb "hop" with walker at NWB.  Second, assisted pt with amb "Hopping" a very limited distance of 5 feet due to effort/fatigue.  Pt also present balance instability and impaired coordination to safely navigate walker at NWB.  "I'm [age] years old", stated pt in frustration.  Returned to room in recliner and perofrmed a few TE's. Will see pt again this afternoon as pt is NOT safe to D/C to home.  HIGH FALL RISK.   Recommendations for follow up therapy are one component of a multi-disciplinary discharge planning process, led by the attending physician.  Recommendations may be updated based on patient status, additional functional criteria and insurance authorization.  Follow Up  Recommendations       Assistance Recommended at Discharge Frequent or constant Supervision/Assistance  Patient can return home with the following A lot of help with walking and/or transfers;A lot of help with bathing/dressing/bathroom;Assistance with cooking/housework;Help with stairs or ramp for entrance   Equipment Recommendations  Rolling walker (2 wheels);Wheelchair cushion (measurements PT);BSC/3in1;Hospital bed;Wheelchair (measurements PT)    Recommendations for Other Services       Precautions / Restrictions Precautions Precautions: Fall Required Braces or Orthoses: Splint/Cast;Knee Immobilizer - Right Knee Immobilizer - Right: On at all times Splint/Cast: R long leg splint applied 08/30/22 during sx Restrictions Weight Bearing Restrictions: Yes RLE Weight Bearing: Non weight bearing     Mobility  Bed Mobility Overal bed mobility: Needs Assistance Bed Mobility: Supine to Sit     Supine to sit: Min guard, Min assist     General bed mobility comments: demonstarted and instructed how to use a belt to self guide LE off bed.  Required increased time and effort.    Transfers Overall transfer level: Needs assistance Equipment used: Rolling walker (2 wheels) Transfers: Sit to/from Stand Sit to Stand: From elevated surface, Mod assist Stand pivot transfers: Mod assist         General transfer comment: Pt stood from bed and from recliner.  Required mod A to rise and steady wtih cues for hand placement and NWB.  Pt performed stand pivot with Mod A to steady and control RW- pivoted toward L side.  Pt requiring min cues and assist for NWB  on R LE    Ambulation/Gait Ambulation/Gait assistance: Mod assist, Max assist Gait Distance (Feet): 5 Feet Assistive device: Rolling walker (2 wheels) Gait Pattern/deviations: Step-to pattern, Decreased stance time - left Gait velocity: decreased     General Gait Details: Applied RIGHT shoe.  First, demonstarted proper tech to amb  "hop" with walker at NWB.  Second, assisted pt with amb "Hopping" a very limited distance of 5 feet due to effort/fatigue.  Pt also present balance instability and impaired coordination to safely navigate walker at NWB.  "I'm [age] years old", stated pt in frustration.   Stairs             Wheelchair Mobility    Modified Rankin (Stroke Patients Only)       Balance                                            Cognition Arousal/Alertness: Awake/alert Behavior During Therapy: WFL for tasks assessed/performed Overall Cognitive Status: Within Functional Limits for tasks assessed                                 General Comments: AxO X 3 pleasant and motivated but frustrated with discharge disposition/situtaion.  Pt from [facility] for ST Rehab from first surgery.        Exercises  10 reps R LE ABD/ADd, SLR, towel squeezes and glut sets.      General Comments        Pertinent Vitals/Pain Pain Assessment Pain Assessment: Faces Faces Pain Scale: Hurts a little bit Pain Location: R knee with actiivty Pain Descriptors / Indicators: Operative site guarding, Discomfort, Grimacing Pain Intervention(s): Monitored during session, Repositioned, Premedicated before session    Home Living                          Prior Function            PT Goals (current goals can now be found in the care plan section) Progress towards PT goals: Progressing toward goals    Frequency    Min 1X/week      PT Plan Current plan remains appropriate    Co-evaluation              AM-PAC PT "6 Clicks" Mobility   Outcome Measure  Help needed turning from your back to your side while in a flat bed without using bedrails?: A Little Help needed moving from lying on your back to sitting on the side of a flat bed without using bedrails?: A Little Help needed moving to and from a bed to a chair (including a wheelchair)?: A Little Help  needed standing up from a chair using your arms (e.g., wheelchair or bedside chair)?: A Lot Help needed to walk in hospital room?: A Lot Help needed climbing 3-5 steps with a railing? : Total 6 Click Score: 14    End of Session Equipment Utilized During Treatment: Gait belt;Right knee immobilizer Activity Tolerance: Patient limited by fatigue Patient left: with chair alarm set;in chair;with call bell/phone within reach Nurse Communication: Mobility status PT Visit Diagnosis: Other abnormalities of gait and mobility (R26.89);Muscle weakness (generalized) (M62.81)     Time: 1610-9604 PT Time Calculation (min) (ACUTE ONLY): 24 min  Charges:  $Gait Training: 8-22 mins $Therapeutic  Exercise: 8-22 mins                     {Brandon Mendoza  PTA Acute  Rehabilitation Services Office M-F          [phone]

## 2022-09-02 DIAGNOSIS — S76111A Strain of right quadriceps muscle, fascia and tendon, initial encounter: Secondary | ICD-10-CM | POA: Diagnosis not present

## 2022-09-02 NOTE — Plan of Care (Signed)
  Problem: Education: Goal: Knowledge of the prescribed therapeutic regimen will improve Outcome: Progressing   Problem: Clinical Measurements: Goal: Postoperative complications will be avoided or minimized Outcome: Progressing   Problem: Pain Management: Goal: Pain level will decrease with appropriate interventions Outcome: Progressing   Problem: Nutrition: Goal: Adequate nutrition will be maintained Outcome: Progressing

## 2022-09-02 NOTE — Progress Notes (Signed)
Orthopedic Tech Progress Note Patient Details:  Brandon Mendoza Dec 17, 1940 [id number] Postop shoe was delivered to patient's room.  Ortho Devices Type of Ortho Device: Postop shoe/boot Ortho Device/Splint Location: Right foot Ortho Device/Splint Interventions: Ordered      [name] 09/02/2022, 9:28 AM

## 2022-09-02 NOTE — Plan of Care (Signed)
  Problem: Education: Goal: Knowledge of the prescribed therapeutic regimen will improve Outcome: Progressing   Problem: Activity: Goal: Ability to avoid complications of mobility impairment will improve Outcome: Progressing   Problem: Pain Management: Goal: Pain level will decrease with appropriate interventions Outcome: Progressing   

## 2022-09-02 NOTE — Progress Notes (Signed)
Physical Therapy Treatment Patient Details Name: Brandon Mendoza MRN: [id number] DOB: [birth year] Today's Date: 09/02/2022   History of Present Illness Pt is 82 yo male admitted on 08/30/22 with recurrent R quadriceps tendon rupture and is now s/p revision of R quadriceps tendon repair and application of long leg splint.  Pt initially with quad tendon rupture on 07/09/22 with repair on 07/12/22.  Pt with hx including but not limited to COPD, macular degeneration, spinal stenosis, lumbar fusion, CVA, CKD, afib, neuropathy    PT Comments    Per Ortho Note dated 09/02/22: "Weight bearing as tolerated once up on his feet Knee immobilizer and splint in place, will use cast shoe for grip support No use of RLE to push out of bed/chair.  Plan to stay through the weekend with goal of progressing to be safe at home, as he is not sure he will be able to afford SNF costs out of pocket."  General Comments: Oriented x4, able to follow commands.  Pleasant/motivated.  Lives hom with Spouse but she Physically can not asisst due to Hx back surgery  Assisted OOB to amb in hallway.  General bed mobility comments: demonstarted and instructed how to use abelt to self assist LE.  General transfer comment: slightly impulsive and off balance initailly.  Therapist recovered.  VC's on proper hand placement and assist for balance during hand transfer from recliner to walker.  Present with posterior LOB during this transition.  Also VC's to slow down" during turns to increase balance/stability.  slightly ataxic.  General Gait Details: Pt no longer NWB, changed to WBAT with KI and cast shoe applied.  Also applied his LEFT shoe.  Pt tolerated a greater distance amb with walker.  Slightly impulsive.  VC's for safety esp with turns.  Recliner following as a precaution.  Present with mild ataxia.  Pt unaware.  Hx multiple falls.  VC's on proper walker to self distance as well as upright posture. Returned to room in recliner and positioned to  comfort with R LE elevated.   Will see pt again this afternoon to progress his mobility.   Recommendations for follow up therapy are one component of a multi-disciplinary discharge planning process, led by the attending physician.  Recommendations may be updated based on patient status, additional functional criteria and insurance authorization.  Follow Up Recommendations       Assistance Recommended at Discharge Frequent or constant Supervision/Assistance  Patient can return home with the following A lot of help with walking and/or transfers;A lot of help with bathing/dressing/bathroom;Assistance with cooking/housework;Help with stairs or ramp for entrance   Equipment Recommendations  Other (comment) (pt has a walker and a wheelchair)    Recommendations for Other Services       Precautions / Restrictions Precautions Precautions: Fall Required Braces or Orthoses: Splint/Cast;Knee Immobilizer - Right;Other Brace (cast shoe) Knee Immobilizer - Right: On at all times Splint/Cast: R long leg splint applied 08/30/22 during sx, KI all times then a cast shoe for amb Restrictions Weight Bearing Restrictions: No RLE Weight Bearing: Weight bearing as tolerated Other Position/Activity Restrictions: Per Ortho note dated 09/02/22 now WBAT with KI and cast shoe     Mobility  Bed Mobility Overal bed mobility: Needs Assistance Bed Mobility: Supine to Sit     Supine to sit: Min guard, Supervision     General bed mobility comments: demonstarted and instructed how to use abelt to self assist LE    Transfers Overall transfer level: Needs assistance Equipment used:  Rolling walker (2 wheels) Transfers: Sit to/from Stand Sit to Stand: From elevated surface, Mod assist           General transfer comment: slightly impulsive and off balance initailly.  Therapist recovered.  VC's on proper hand placement and assist for balance during hand transfer from recliner to walker.  Present with posterior  LOB during this transition.  Also VC's to slow down" during turns to increase balance/stability.  slightly ataxic    Ambulation/Gait Ambulation/Gait assistance: Min assist Gait Distance (Feet): 45 Feet Assistive device: Rolling walker (2 wheels) Gait Pattern/deviations: Decreased stance time - right, Trunk flexed, Ataxic, Decreased step length - right, Decreased step length - left Gait velocity: decreased     General Gait Details: Pt no longer NWB, changed to WBAT with KI and cast shoe applied.  Also applied his LEFT shoe.  Pt tolerated a greater distance amb with walker.  Slightly impulsive.  VC's for safety esp with turns.  Recliner following as a precaution.  Present with mild ataxia.  Pt unaware.  Hx multiple falls.  VC's on proper walker to self distance as well as upright posture.   Stairs             Wheelchair Mobility    Modified Rankin (Stroke Patients Only)       Balance                                            Cognition Arousal/Alertness: Awake/alert Behavior During Therapy: WFL for tasks assessed/performed                                   General Comments: Oriented x4, able to follow commands.  Pleasant/motivated.  Lives hom with Spouse but she Physically can not asisst due to Hx back surgery        Exercises      General Comments        Pertinent Vitals/Pain Pain Assessment Pain Assessment: 0-10 Pain Score: 3  Pain Location: R knee with actiivty Pain Descriptors / Indicators: Operative site guarding, Discomfort Pain Intervention(s): Monitored during session    Home Living                          Prior Function            PT Goals (current goals can now be found in the care plan section) Progress towards PT goals: Progressing toward goals    Frequency    Min 1X/week      PT Plan Current plan remains appropriate    Co-evaluation              AM-PAC PT "6 Clicks" Mobility    Outcome Measure  Help needed turning from your back to your side while in a flat bed without using bedrails?: A Little Help needed moving from lying on your back to sitting on the side of a flat bed without using bedrails?: A Little Help needed moving to and from a bed to a chair (including a wheelchair)?: A Little Help needed standing up from a chair using your arms (e.g., wheelchair or bedside chair)?: A Little Help needed to walk in hospital room?: A Lot Help needed climbing 3-5 steps with a railing? : A Lot 6 Click  Score: 16    End of Session Equipment Utilized During Treatment: Gait belt;Right knee immobilizer;Other (comment) (RIGHT cast shoe) Activity Tolerance: Patient tolerated treatment well Patient left: in chair;with call bell/phone within reach;with chair alarm set Nurse Communication: Mobility status PT Visit Diagnosis: Other abnormalities of gait and mobility (R26.89);Muscle weakness (generalized) (M62.81)     Time: 1610-9604 PT Time Calculation (min) (ACUTE ONLY): 29 min  Charges:  $Gait Training: 8-22 mins $Therapeutic Activity: 8-22 mins                     Felecia Shelling  PTA Acute  Rehabilitation Services Office M-F          [phone]

## 2022-09-02 NOTE — Progress Notes (Signed)
Physical Therapy Treatment Patient Details Name: Brandon Mendoza MRN: [id number] DOB: [birth year] Today's Date: 09/02/2022   History of Present Illness Pt is 82 yo male admitted on 08/30/22 with recurrent R quadriceps tendon rupture and is now s/p revision of R quadriceps tendon repair and application of long leg splint.  Pt initially with quad tendon rupture on 07/09/22 with repair on 07/12/22.  Pt with hx including but not limited to COPD, macular degeneration, spinal stenosis, lumbar fusion, CVA, CKD, afib, neuropathy    PT Comments    POD # 3 pm session Pt progressing well now that he is WBAT.  Assisted with amb in hallway a greater distance.  Slightly impulsive.  VC's for safety esp with turns.  Recliner following as a precaution.  Present with mild ataxia.  Pt unaware.  Hx multiple falls.  VC's on proper walker to self distance as well as upright posture.  Assisted back to bed General bed mobility comments: demonstarted and instructed how to use abelt to self assist LE.  Pt was able to self removed RIGHT cast shoe and LEFT shoe and performed a few TE's.  Positioned to comfort and elevated R LE.  Will continue Physical Therapy through the weekend as plans are to D/C to home Monday.  Pt needs to be as mobile safe as possible as Spouse is unable to physically assist due to back Hx back surgery.   Recommendations for follow up therapy are one component of a multi-disciplinary discharge planning process, led by the attending physician.  Recommendations may be updated based on patient status, additional functional criteria and insurance authorization.  Follow Up Recommendations       Assistance Recommended at Discharge Frequent or constant Supervision/Assistance  Patient can return home with the following A lot of help with walking and/or transfers;A lot of help with bathing/dressing/bathroom;Assistance with cooking/housework;Help with stairs or ramp for entrance   Equipment Recommendations  Other  (comment) (pt has a walker and a wheelchair)    Recommendations for Other Services       Precautions / Restrictions Precautions Precautions: Fall Required Braces or Orthoses: Splint/Cast;Knee Immobilizer - Right;Other Brace (cast shoe) Knee Immobilizer - Right: On at all times Splint/Cast: R long leg splint applied 08/30/22 during sx, KI all times then a cast shoe for amb Restrictions Weight Bearing Restrictions: No RLE Weight Bearing: Weight bearing as tolerated Other Position/Activity Restrictions: Per Ortho note dated 09/02/22 now WBAT with KI and cast shoe     Mobility  Bed Mobility Overal bed mobility: Needs Assistance Bed Mobility: Sit to Supine     Supine to sit: Min guard, Supervision Sit to supine: Supervision, Min guard   General bed mobility comments: demonstarted and instructed how to use abelt to self assist LE.  Pt was able to self removed RIGHT cast shoe and LEFT shoe.    Transfers Overall transfer level: Needs assistance Equipment used: Rolling walker (2 wheels) Transfers: Sit to/from Stand Sit to Stand: Min assist, Mod assist           General transfer comment: slightly impulsive and off balance initailly.  Therapist recovered.  VC's on proper hand placement and assist for balance during hand transfer from recliner to walker.  Present with posterior LOB during this transition.  Also VC's to slow down" during turns to increase balance/stability.  slightly ataxic    Ambulation/Gait Ambulation/Gait assistance: Min guard, Min assist Gait Distance (Feet): 115 Feet Assistive device: Rolling walker (2 wheels) Gait Pattern/deviations: Decreased stance time -  right, Trunk flexed, Ataxic, Decreased step length - right, Decreased step length - left Gait velocity: decreased     General Gait Details: Pt no longer NWB, changed to WBAT with KI and cast shoe applied.  Also applied his LEFT shoe.  Pt tolerated a greater distance amb with walker.  Slightly impulsive.   VC's for safety esp with turns.  Recliner following as a precaution.  Present with mild ataxia.  Pt unaware.  Hx multiple falls.  VC's on proper walker to self distance as well as upright posture.   Stairs Stairs: Yes Stairs assistance: Mod assist Stair Management: Two rails, Step to pattern, Forwards Number of Stairs: 3 General stair comments: VC's on proper sequencing as well as instructions to RIGHT circumduct in order to clear RIGHT foot   Wheelchair Mobility    Modified Rankin (Stroke Patients Only)       Balance                                            Cognition Arousal/Alertness: Awake/alert Behavior During Therapy: WFL for tasks assessed/performed                                   General Comments: Oriented x4, able to follow commands.  Pleasant/motivated.  Lives hom with Spouse but she Physically can not asisst due to Hx back surgery        Exercises  10 reps AP 10 reps knee presses 10 reps R ABD/ADd 10 reps R SLR    General Comments        Pertinent Vitals/Pain Pain Assessment Pain Assessment: 0-10 Pain Score: 3  Pain Location: R knee with actiivty Pain Descriptors / Indicators: Operative site guarding, Discomfort Pain Intervention(s): Monitored during session    Home Living                          Prior Function            PT Goals (current goals can now be found in the care plan section) Progress towards PT goals: Progressing toward goals    Frequency    Min 1X/week      PT Plan Current plan remains appropriate    Co-evaluation              AM-PAC PT "6 Clicks" Mobility   Outcome Measure  Help needed turning from your back to your side while in a flat bed without using bedrails?: A Little Help needed moving from lying on your back to sitting on the side of a flat bed without using bedrails?: A Little Help needed moving to and from a bed to a chair (including a wheelchair)?: A  Little Help needed standing up from a chair using your arms (e.g., wheelchair or bedside chair)?: A Little Help needed to walk in hospital room?: A Lot Help needed climbing 3-5 steps with a railing? : A Lot 6 Click Score: 16    End of Session Equipment Utilized During Treatment: Gait belt;Right knee immobilizer;Other (comment) (RIGHT cast shoe) Activity Tolerance: Patient tolerated treatment well Patient left: in bed;with bed alarm set Nurse Communication: Mobility status PT Visit Diagnosis: Other abnormalities of gait and mobility (R26.89);Muscle weakness (generalized) (M62.81)     Time: 1415-1440 PT Time Calculation (min) (  ACUTE ONLY): 25 min  Charges:  $Gait Training: 8-22 mins $Therapeutic Activity: 8-22 mins                     Felecia Shelling  PTA Acute  Rehabilitation Services Office M-F          [phone]

## 2022-09-02 NOTE — Progress Notes (Addendum)
Subjective: 3 Days Post-Op Procedure(s) (LRB): REPAIR QUADRICEP TENDON (Right) Patient reports pain as mild.   Patient seen in rounds with Dr. Charlann Boxer. Patient is well, and has had no acute complaints or problems. No acute events overnight. We had a long discussion this morning about insurance, disposition, and plans for home. We will make modifications to activity as detailed below. We will continue therapy today.   Objective: Vital signs in last 24 hours: Temp:  [97.7 F (36.5 C)-98.3 F (36.8 C)] 97.8 F (36.6 C) (06/07 0604) Pulse Rate:  [59-72] 60 (06/07 0604) Resp:  [16-18] 17 (06/07 0604) BP: (116-143)/(56-63) 143/63 (06/07 0604) SpO2:  [94 %-96 %] 96 % (06/07 0604)  Intake/Output from previous day:  Intake/Output Summary (Last 24 hours) at 09/02/2022 0754 Last data filed at 09/02/2022 0600 Gross per 24 hour  Intake 120 ml  Output 1150 ml  Net -1030 ml     Intake/Output this shift: No intake/output data recorded.  Labs: Recent Labs    08/31/22 0328 09/01/22 0630  HGB 12.5* 12.9*   Recent Labs    08/31/22 0328 09/01/22 0630  WBC 8.6 9.6  RBC 4.07* 4.19*  HCT 37.1* 38.8*  PLT 228 257   Recent Labs    08/31/22 0328  NA 136  K 3.9  CL 104  CO2 23  BUN 23  CREATININE 1.01  GLUCOSE 159*  CALCIUM 8.6*   No results for input(s): "LABPT", "INR" in the last 72 hours.  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   The spinal cord appears normal. 2.   No spinal stenosis. 3.   Mild multilevel degenerative changes as detailed above that do not lead to spinal stenosis or nerve root compression. 4.   T2 hyperintense foci within the pons consistent with chronic microvascular ischemic changes.   Loop - [employer] Linq 10/22/2020 10/22/2020   Nocturia    Paroxysmal atrial fibrillation (HCC)    Peripheral neuropathy    Peripheral vascular disease (HCC)    Sleep apnea    on 6 cm, nasal pillow   Spinal stenosis    Stroke (HCC) 05/01/2011   ischemic   Weakness generalized 10/21/2021   Severe, legs and arms    Assessment/Plan: 3 Days Post-Op Procedure(s) (LRB): REPAIR QUADRICEP TENDON (Right) Principal Problem:   Quadriceps tendon rupture, right, subsequent encounter  Estimated body mass index is 26.58 kg/m as calculated from the following:   Height as of this encounter: 5\' 9"  (1.753 m).   Weight as of this encounter: 81.6 kg. Advance  diet Up with therapy   DVT Prophylaxis - Aspirin Weight bearing as tolerated once up on his feet Knee immobilizer and splint in place, will use cast shoe for grip support No use of RLE to push out of bed/chair  Plan to stay through the weekend with goal of progressing to be safe at home, as he is not sure he will be able to afford SNF costs out of pocket.  We do not have any ability to get this approved it would seem, as we have tried working with the utilization review team and he is not eligible for inpatient status.      Dennie Bible, PA-C Orthopedic Surgery [phone] 09/02/2022, 7:54 AM

## 2022-09-02 NOTE — TOC Progression Note (Signed)
Transition of Care [facility]) - Progression Note    Patient Details  Name: Brandon Mendoza MRN: [id number] Date of Birth: 09-01-1940  [facility] CM/SW Contact  Amada Jupiter, LCSW Phone Number: 09/02/2022, 1:50 PM  Clinical Narrative:     Pt and team all on same plan for home discharge likely on Monday.  Pt feeling better about his mobility and no longer feels need for hospital bed at home.   Pt adds that he already has a RW so no DME needs at all.  Confirmed with pt that [facility] had been following him to begin [facility] services and he is agreeable with this.  Have requested orders be placed.  TOC will continue to follow.  Expected Discharge Plan: Home w Home Health Services (vs. SNF) Barriers to Discharge: Continued Medical Work up  Expected Discharge Plan and Services In-house Referral: Clinical Social Work     Living arrangements for the past 2 months: Skilled Nursing Facility                                       Social Determinants of Health (SDOH) Interventions SDOH Screenings   Food Insecurity: No Food Insecurity (08/30/2022)  Housing: Low Risk  (08/30/2022)  Transportation Needs: No Transportation Needs (08/30/2022)  Utilities: Not At Risk (08/30/2022)  Depression (PHQ2-9): Low Risk  (12/10/2021)  Tobacco Use: Medium Risk (08/31/2022)    Readmission Risk Interventions    07/09/2022    1:44 PM  Readmission Risk Prevention Plan  Transportation Screening Complete  PCP or Specialist Appt within 5-7 Days Complete  Home Care Screening Complete  Medication Review (RN CM) Complete

## 2022-09-03 ENCOUNTER — Other Ambulatory Visit: Payer: Self-pay

## 2022-09-03 DIAGNOSIS — S76111A Strain of right quadriceps muscle, fascia and tendon, initial encounter: Secondary | ICD-10-CM | POA: Diagnosis not present

## 2022-09-03 NOTE — Plan of Care (Signed)
Problem: Activity: Goal: Ability to avoid complications of mobility impairment will improve Outcome: Progressing   Problem: Clinical Measurements: Goal: Postoperative complications will be avoided or minimized Outcome: Progressing   Problem: Pain Management: Goal: Pain level will decrease with appropriate interventions Outcome: Progressing   Haydee Salter, RN 09/03/22 7:57 AM

## 2022-09-03 NOTE — Progress Notes (Signed)
Physical Therapy Treatment Patient Details Name: Brandon Mendoza MRN: [id number] DOB: Oct 07, 1940 Today's Date: 09/03/2022   History of Present Illness Pt is 82 yo male admitted on 08/30/22 with recurrent R quadriceps tendon rupture and is now s/p revision of R quadriceps tendon repair and application of long leg splint.  Pt initially with quad tendon rupture on 07/09/22 with repair on 07/12/22.  Pt with hx including but not limited to COPD, macular degeneration, spinal stenosis, lumbar fusion, CVA, CKD, afib, neuropathy    PT Comments    Pt progressing toward goals. Continues to require assists to steady with gait and safety cues. Continue PT POC   Recommendations for follow up therapy are one component of a multi-disciplinary discharge planning process, led by the attending physician.  Recommendations may be updated based on patient status, additional functional criteria and insurance authorization.  Follow Up Recommendations       Assistance Recommended at Discharge Frequent or constant Supervision/Assistance  Patient can return home with the following Assistance with cooking/housework;Help with stairs or ramp for entrance;A little help with walking and/or transfers;A little help with bathing/dressing/bathroom   Equipment Recommendations  None recommended by PT    Recommendations for Other Services       Precautions / Restrictions Precautions Precautions: Fall Required Braces or Orthoses: Knee Immobilizer - Right;Other Brace Knee Immobilizer - Right: On at all times Splint/Cast: R KI + R  cast shoe Restrictions RLE Weight Bearing: Weight bearing as tolerated Other Position/Activity Restrictions: Per Ortho note dated 09/02/22 now WBAT with KI and cast shoe     Mobility  Bed Mobility   Bed Mobility: Supine to Sit     Supine to sit: Min guard, Supervision     General bed mobility comments: for safety, pt able to complete with leg lifter; pt plans to sleep in recliner     Transfers Overall transfer level: Needs assistance Equipment used: Rolling walker (2 wheels) Transfers: Sit to/from Stand Sit to Stand: Min guard           General transfer comment: verbal  cues for proper hand placement and RLE position    Ambulation/Gait Ambulation/Gait assistance: Min guard, Min assist Gait Distance (Feet): 85 Feet Assistive device: Rolling walker (2 wheels) Gait Pattern/deviations: Decreased stance time - right, Trunk flexed, Wide base of support       General Gait Details: worked on [employer] especially with turns. pt min assist to min-guard, remains unsteady requiring intermittent min assist for balance; cues for RW position and safety   Stairs             Wheelchair Mobility    Modified Rankin (Stroke Patients Only)       Balance   Sitting-balance support: No upper extremity supported Sitting balance-Leahy Scale: Good     Standing balance support: Bilateral upper extremity supported, Reliant on assistive device for balance Standing balance-Leahy Scale: Poor                              Cognition Arousal/Alertness: Awake/alert Behavior During Therapy: WFL for tasks assessed/performed Overall Cognitive Status: Within Functional Limits for tasks assessed                                 General Comments: Oriented x4, able to follow commands.  Pleasant/motivated.  Lives home with Spouse but she Physically can not asisst due  to Hx back surgery        Exercises      General Comments        Pertinent Vitals/Pain Pain Assessment Pain Assessment: No/denies pain    Home Living                          Prior Function            PT Goals (current goals can now be found in the care plan section) Acute Rehab PT Goals Patient Stated Goal: heal R LE PT Goal Formulation: With patient Time For Goal Achievement: 09/14/22 Potential to Achieve Goals: Good Progress towards PT goals: Progressing  toward goals    Frequency    7X/week      PT Plan Current plan remains appropriate    Co-evaluation              AM-PAC PT "6 Clicks" Mobility   Outcome Measure  Help needed turning from your back to your side while in a flat bed without using bedrails?: A Little Help needed moving from lying on your back to sitting on the side of a flat bed without using bedrails?: A Little Help needed moving to and from a bed to a chair (including a wheelchair)?: A Little Help needed standing up from a chair using your arms (e.g., wheelchair or bedside chair)?: A Little Help needed to walk in hospital room?: A Little Help needed climbing 3-5 steps with a railing? : A Little 6 Click Score: 18    End of Session Equipment Utilized During Treatment: Gait belt;Right knee immobilizer;Other (comment) (R post op shoe) Activity Tolerance: Patient tolerated treatment well Patient left: in chair;with call bell/phone within reach;with chair alarm set Nurse Communication: Mobility status PT Visit Diagnosis: Other abnormalities of gait and mobility (R26.89);Muscle weakness (generalized) (M62.81)     Time: 1610-9604 PT Time Calculation (min) (ACUTE ONLY): 22 min  Charges:  $Gait Training: 8-22 mins                     Delice Bison, PT  Acute Rehab Dept [facility]) [phone]  09/03/2022    [facility] 09/03/2022, 11:17 AM

## 2022-09-03 NOTE — Progress Notes (Signed)
   Subjective: 4 Days Post-Op Procedure(s) (LRB): REPAIR QUADRICEP TENDON (Right) Patient reports pain as mild.   Patient seen in rounds for Dr. Charlann Boxer. Patient is doing well this AM. No issues overnight.   Objective: Vital signs in last 24 hours: Temp:  [97.7 F (36.5 C)-98.4 F (36.9 C)] 98.4 F (36.9 C) (06/08 0505) Pulse Rate:  [60-91] 60 (06/08 0505) Resp:  [17-18] 17 (06/08 0505) BP: (128-139)/(64-73) 128/64 (06/08 0505) SpO2:  [92 %-98 %] 98 % (06/08 0505) FiO2 (%):  [21 %] 21 % (06/07 2306)  Intake/Output from previous day:  Intake/Output Summary (Last 24 hours) at 09/03/2022 0713 Last data filed at 09/03/2022 0222 Gross per 24 hour  Intake --  Output 900 ml  Net -900 ml    Intake/Output this shift: No intake/output data recorded.  Labs: Recent Labs    09/01/22 0630  HGB 12.9*   Recent Labs    09/01/22 0630  WBC 9.6  RBC 4.19*  HCT 38.8*  PLT 257   No results for input(s): "NA", "K", "CL", "CO2", "BUN", "CREATININE", "GLUCOSE", "CALCIUM" in the last 72 hours. No results for input(s): "LABPT", "INR" in the last 72 hours.  Exam: General - Patient is Alert and Oriented Extremity - Neurologically intact Neurovascular intact Sensation intact distally Dorsiflexion/Plantar flexion intact Dressing/Incision - Bulky dressing with KI in place Motor Function - intact, moving foot and toes well on exam.   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   The spinal cord appears normal. 2.   No spinal stenosis. 3.   Mild multilevel degenerative changes as detailed above that do not lead to spinal stenosis or nerve root compression. 4.   T2 hyperintense foci within the pons consistent with chronic microvascular ischemic changes.   Loop - [employer] Linq 10/22/2020 10/22/2020   Nocturia    Paroxysmal atrial fibrillation (HCC)    Peripheral neuropathy    Peripheral vascular disease (HCC)    Sleep apnea    on 6 cm, nasal pillow   Spinal stenosis    Stroke (HCC) 05/01/2011   ischemic   Weakness generalized 10/21/2021   Severe, legs and arms    Assessment/Plan: 4 Days Post-Op Procedure(s) (LRB): REPAIR QUADRICEP TENDON (Right) Principal Problem:   Quadriceps tendon rupture, right, subsequent encounter  Estimated body mass index is 26.58 kg/m as calculated from the following:   Height as of this encounter: 5\' 9"  (1.753 m).   Weight as of this encounter: 81.6 kg. Up with therapy  Continue mobilizing with therapy today  Arther Abbott, PA-C Orthopedic Surgery [phone] 09/03/2022, 7:13 AM

## 2022-09-03 NOTE — Progress Notes (Signed)
Physical Therapy Treatment Patient Details Name: Brandon Mendoza MRN: [id number] DOB: 22-Feb-1941 Today's Date: 09/03/2022   History of Present Illness Pt is 82 yo male admitted on 08/30/22 with recurrent R quadriceps tendon rupture and is now s/p revision of R quadriceps tendon repair and application of long leg splint.  Pt initially with quad tendon rupture on 07/09/22 with repair on 07/12/22.  Pt with hx including but not limited to COPD, macular degeneration, spinal stenosis, lumbar fusion, CVA, CKD, afib, neuropathy    PT Comments    Continued progress toward PT goals; amb distance to tolerance, reviewed stairs; pt is hopeful to be ready to d/c by Monday   Recommendations for follow up therapy are one component of a multi-disciplinary discharge planning process, led by the attending physician.  Recommendations may be updated based on patient status, additional functional criteria and insurance authorization.  Follow Up Recommendations       Assistance Recommended at Discharge Frequent or constant Supervision/Assistance  Patient can return home with the following Assistance with cooking/housework;Help with stairs or ramp for entrance;A little help with walking and/or transfers;A little help with bathing/dressing/bathroom   Equipment Recommendations  None recommended by PT    Recommendations for Other Services       Precautions / Restrictions Precautions Precautions: Fall Required Braces or Orthoses: Knee Immobilizer - Right;Other Brace Knee Immobilizer - Right: On at all times Splint/Cast: R KI + R  cast shoe Restrictions Weight Bearing Restrictions: No RLE Weight Bearing: Weight bearing as tolerated Other Position/Activity Restrictions: Per Ortho note dated 09/02/22 now WBAT with KI and cast shoe     Mobility  Bed Mobility   Bed Mobility: Sit to Supine     Supine to sit:  Sit to supine: Supervision   General bed mobility comments: for safety, pt able to complete without leg  lifter; pt plans to sleep in recliner    Transfers Overall transfer level: Needs assistance Equipment used: Rolling walker (2 wheels) Transfers: Sit to/from Stand Sit to Stand: Min guard, Supervision           General transfer comment: verbal  cues for proper hand placement and RLE position    Ambulation/Gait Ambulation/Gait assistance: Min guard Gait Distance (Feet): 80 Feet Assistive device: Rolling walker (2 wheels) Gait Pattern/deviations: Decreased stance time - right, Trunk flexed, Wide base of support       General Gait Details: worked on [employer] especially with turns. pt  min-guard, remains unsteady however no overt LOB, requiring intermittent min assist for balance; cues for RW position and safety   Stairs Stairs: Yes Stairs assistance: Min guard Stair Management: Two rails, Step to pattern, Forwards Number of Stairs: 3 General stair comments: verbal cues for proper sequence, min/guard for safety-no LOB   Wheelchair Mobility    Modified Rankin (Stroke Patients Only)       Balance   Sitting-balance support: No upper extremity supported Sitting balance-Leahy Scale: Good     Standing balance support: Bilateral upper extremity supported, Reliant on assistive device for balance Standing balance-Leahy Scale: Poor                              Cognition Arousal/Alertness: Awake/alert Behavior During Therapy: WFL for tasks assessed/performed Overall Cognitive Status: Within Functional Limits for tasks assessed  General Comments: Oriented x4, able to follow commands.  Pleasant/motivated.  Lives home with Spouse but she Physically can not asisst due to Hx back surgery        Exercises      General Comments        Pertinent Vitals/Pain Pain Assessment Pain Assessment: No/denies pain    Home Living                          Prior Function            PT Goals (current goals  can now be found in the care plan section) Acute Rehab PT Goals Patient Stated Goal: heal R LE PT Goal Formulation: With patient Time For Goal Achievement: 09/14/22 Potential to Achieve Goals: Good Progress towards PT goals: Progressing toward goals    Frequency    7X/week      PT Plan Current plan remains appropriate    Co-evaluation              AM-PAC PT "6 Clicks" Mobility   Outcome Measure  Help needed turning from your back to your side while in a flat bed without using bedrails?: A Little Help needed moving from lying on your back to sitting on the side of a flat bed without using bedrails?: A Little Help needed moving to and from a bed to a chair (including a wheelchair)?: A Little Help needed standing up from a chair using your arms (e.g., wheelchair or bedside chair)?: A Little Help needed to walk in hospital room?: A Little Help needed climbing 3-5 steps with a railing? : A Little 6 Click Score: 18    End of Session Equipment Utilized During Treatment: Gait belt;Right knee immobilizer;Other (comment) (R post op shoe) Activity Tolerance: Patient tolerated treatment well Patient left: with call bell/phone within reach;in bed;with bed alarm set Nurse Communication: Mobility status PT Visit Diagnosis: Other abnormalities of gait and mobility (R26.89);Muscle weakness (generalized) (M62.81)     Time: 6213-0865 PT Time Calculation (min) (ACUTE ONLY): 12 min  Charges:  $Gait Training: 8-22 mins                      Delice Bison, PT  Acute Rehab Dept [facility]) [phone]  09/03/2022    [facility] 09/03/2022, 2:31 PM

## 2022-09-04 DIAGNOSIS — S76111A Strain of right quadriceps muscle, fascia and tendon, initial encounter: Secondary | ICD-10-CM | POA: Diagnosis not present

## 2022-09-04 NOTE — Progress Notes (Signed)
   09/04/22 1520  What Happened  Was fall witnessed? Yes  Who witnessed fall? Drucilla Chalet, PT  Patients activity before fall during therapy  Point of contact arm/shoulder (right scapular area)  Was patient injured? Yes (abrasion)  Provider Notification  Provider Name/Title Office of Dr. Charlann Boxer  Date Provider Notified 09/04/22  Time Provider Notified 1520  Method of Notification Call  Notification Reason Other (Comment) (patient fall)  Provider response Evaluate remotely (Dr. Odis Hollingshead called back)  Date of Provider Response 09/04/22  Time of Provider Response 1529  Follow Up  Family notified No - patient refusal  Time family notified  (N/A)  Additional tests No  Simple treatment Other (comment) (open to air)  Progress note created (see row info) Yes   Haydee Salter, RN 09/04/22 3:48 PM

## 2022-09-04 NOTE — Progress Notes (Signed)
   09/04/22 2216  BiPAP/CPAP/SIPAP  BiPAP/CPAP/SIPAP Pt Type Adult (home unit)  BiPAP/CPAP/SIPAP Resmed  Mask Type Nasal mask  Mask Size Medium  FiO2 (%) 21 %  Patient Home Equipment Yes  Safety Check Completed by RT for Home Unit Yes, no issues noted

## 2022-09-04 NOTE — Progress Notes (Signed)
Physical Therapy Treatment Patient Details Name: Brandon Mendoza MRN: [id number] DOB: [birth year] Today's Date: 09/04/2022   History of Present Illness Pt is 82 yo male admitted on 08/30/22 with recurrent R quadriceps tendon rupture and is now s/p revision of R quadriceps tendon repair and application of long leg splint.  Pt initially with quad tendon rupture on 07/09/22 with repair on 07/12/22.  Pt with hx including but not limited to COPD, macular degeneration, spinal stenosis, lumbar fusion, CVA, CKD, afib, neuropathy    PT Comments    Pt making good progress this session, incr amb distance as well as able to ascend/descen 3 steps x2.  End of session pt had posterior LOB and controlled descent to floor to long sitting position. Pt denied hitting head or other injuries other than small abrasion to inferior R scapula where back grazed over bed table on controlled descent to floor. RN called to room; BP 162/86, HR in 90s, SpO2=97%. Pt assisted back to bed via maximove to avoid any undue stress LLE (KI in place as well as post op shoe). Pt calm and comfortable on PT and RN departure.   Recommendations for follow up therapy are one component of a multi-disciplinary discharge planning process, led by the attending physician.  Recommendations may be updated based on patient status, additional functional criteria and insurance authorization.  Follow Up Recommendations       Assistance Recommended at Discharge Frequent or constant Supervision/Assistance  Patient can return home with the following Assistance with cooking/housework;Help with stairs or ramp for entrance;A little help with walking and/or transfers;A little help with bathing/dressing/bathroom   Equipment Recommendations  None recommended by PT    Recommendations for Other Services       Precautions / Restrictions Precautions Precautions: Fall Required Braces or Orthoses: Knee Immobilizer - Right;Other Brace Knee Immobilizer - Right: On  at all times Splint/Cast: R KI + R  cast shoe Restrictions Weight Bearing Restrictions: No RLE Weight Bearing: Weight bearing as tolerated Other Position/Activity Restrictions: Per Ortho note dated 09/02/22 now WBAT with KI and cast shoe     Mobility  Bed Mobility   Bed Mobility: Supine to Sit     Supine to sit: Supervision, Modified independent (Device/Increase time)     General bed mobility comments: good effort with bed mobility, attempted to don post op shoe in long sitting, needed min assist    Transfers Overall transfer level: Needs assistance Equipment used: Rolling walker (2 wheels) Transfers: Sit to/from Stand Sit to Stand: Supervision           General transfer comment: verbal  cues for proper hand placement and RLE position; no LOB or no physical assist to steady on standing    Ambulation/Gait Ambulation/Gait assistance: Min guard, Supervision Gait Distance (Feet): 90 Feet Assistive device: Rolling walker (2 wheels) Gait Pattern/deviations: Decreased stance time - right       General Gait Details: good progression, consistent with gait pattern; no LOB supervision to min/guard for safety   Stairs Stairs: Yes Stairs assistance: Min guard, Supervision Stair Management: Two rails, Step to pattern, Forwards Number of Stairs: 3 (x2) General stair comments: verbal cues for proper sequence, min/guard to close supervision for safety   Wheelchair Mobility    Modified Rankin (Stroke Patients Only)       Balance   Sitting-balance support: No upper extremity supported Sitting balance-Leahy Scale: Good     Standing balance support: Bilateral upper extremity supported, Reliant on assistive device for balance Standing  balance-Leahy Scale: Poor                              Cognition Arousal/Alertness: Awake/alert Behavior During Therapy: WFL for tasks assessed/performed Overall Cognitive Status: Within Functional Limits for tasks assessed                                  General Comments: Oriented x4, able to follow commands.  Pleasant/motivated.  Lives home with Spouse but she Physically can not asisst due to Hx back surgery        Exercises General Exercises - Lower Extremity Ankle Circles/Pumps: AROM, Both, 10 reps    General Comments        Pertinent Vitals/Pain Pain Assessment Pain Assessment: No/denies pain    Home Living                          Prior Function            PT Goals (current goals can now be found in the care plan section) Acute Rehab PT Goals Patient Stated Goal: heal R LE PT Goal Formulation: With patient Time For Goal Achievement: 09/14/22 Potential to Achieve Goals: Good Progress towards PT goals: Progressing toward goals    Frequency    7X/week      PT Plan Current plan remains appropriate    Co-evaluation              AM-PAC PT "6 Clicks" Mobility   Outcome Measure  Help needed turning from your back to your side while in a flat bed without using bedrails?: A Little Help needed moving from lying on your back to sitting on the side of a flat bed without using bedrails?: A Little Help needed moving to and from a bed to a chair (including a wheelchair)?: A Little Help needed standing up from a chair using your arms (e.g., wheelchair or bedside chair)?: A Little Help needed to walk in hospital room?: A Little Help needed climbing 3-5 steps with a railing? : A Little 6 Click Score: 18    End of Session Equipment Utilized During Treatment: Gait belt;Right knee immobilizer;Other (comment) (R post op shoe) Activity Tolerance: Patient tolerated treatment well Patient left: with call bell/phone within reach;in bed;with bed alarm set Nurse Communication: Mobility status PT Visit Diagnosis: Other abnormalities of gait and mobility (R26.89);Muscle weakness (generalized) (M62.81)     Time: 1610-9604 PT Time Calculation (min) (ACUTE ONLY): 26  min  Charges:  $Gait Training: 23-37 mins                     Iyannah Blake, PT  Acute Rehab Dept ([facility]) [phone]  09/04/2022    [facility] 09/04/2022, 3:35 PM

## 2022-09-04 NOTE — Progress Notes (Signed)
Occupational Therapy Treatment Patient Details Name: YIANNI VROMAN MRN: [id number] DOB: 23-Jan-1941 Today's Date: 09/04/2022   History of present illness Pt is 82 yo male admitted on 08/30/22 with recurrent R quadriceps tendon rupture and is now s/p revision of R quadriceps tendon repair and application of long leg splint.  Pt initially with quad tendon rupture on 07/09/22 with repair on 07/12/22.  Pt with hx including but not limited to COPD, macular degeneration, spinal stenosis, lumbar fusion, CVA, CKD, afib, neuropathy   OT comments  Patient was able to engage in standing to complete simple grooming tasks and simulated clothing management with min A at times to maintain standing balance while washing hands with patient noted to have decreased awareness of leaning to R side. Patient reported he is ready to transition home tomorrow with wife support as wife was helping him with Adls prior to admission. Patient's discharge plan remains appropriate at this time. OT will continue to follow acutely.     Recommendations for follow up therapy are one component of a multi-disciplinary discharge planning process, led by the attending physician.  Recommendations may be updated based on patient status, additional functional criteria and insurance authorization.    Assistance Recommended at Discharge Frequent or constant Supervision/Assistance  Patient can return home with the following  Help with stairs or ramp for entrance;Assistance with cooking/housework;A lot of help with bathing/dressing/bathroom;A lot of help with walking and/or transfers;Assist for transportation   Equipment Recommendations  None recommended by OT       Precautions / Restrictions Precautions Precautions: Fall Required Braces or Orthoses: Knee Immobilizer - Right;Other Brace Knee Immobilizer - Right: On at all times Splint/Cast: R KI + R  cast shoe Restrictions Weight Bearing Restrictions: No RLE Weight Bearing: Weight bearing as  tolerated Other Position/Activity Restrictions: Per Ortho note dated 09/02/22 now WBAT with KI and cast shoe       Mobility Bed Mobility               General bed mobility comments: patient was up in recliner and remained in same at end of session.              ADL either performed or assessed with clinical judgement   ADL Overall ADL's : Needs assistance/impaired     Grooming: Wash/dry hands;Standing;Min guard;Minimal assistance Grooming Details (indicate cue type and reason): at sink with lateral lean to R side with patient seeming to have poor insight to lean. patient needed occasional min A to maintian balance.               Lower Body Dressing Details (indicate cue type and reason): patient delcined to review LB dressing tasks reporting that he has all needed items at home to complete these. patient declined to be educated on how to adjust KI if it moves down with gravity with movement. patient reported " i will be in my recliner i wont need to move it". appeared to have some confusion over question. patient then reported " my wife will help me with everything at home" "she already was prior to admission". Toilet Transfer: Min guard;Ambulation;Rolling walker (2 wheels) Toilet Transfer Details (indicate cue type and reason): with increased time simulated in room declining to use bathroom at this time.                  Cognition Arousal/Alertness: Awake/alert Behavior During Therapy: WFL for tasks assessed/performed Overall Cognitive Status: Within Functional Limits for tasks assessed  Pertinent Vitals/ Pain       Pain Assessment Pain Assessment: No/denies pain         Frequency  Min 1X/week        Progress Toward Goals  OT Goals(current goals can now be found in the care plan section)  Progress towards OT goals: Progressing toward goals     Plan Discharge plan remains appropriate       AM-PAC OT "6  Clicks" Daily Activity     Outcome Measure   Help from another person eating meals?: None Help from another person taking care of personal grooming?: None Help from another person toileting, which includes using toliet, bedpan, or urinal?: A Little Help from another person bathing (including washing, rinsing, drying)?: A Lot Help from another person to put on and taking off regular upper body clothing?: None Help from another person to put on and taking off regular lower body clothing?: A Lot 6 Click Score: 19    End of Session Equipment Utilized During Treatment: Gait belt;Rolling walker (2 wheels)  OT Visit Diagnosis: Unsteadiness on feet (R26.81);History of falling (Z91.81);Muscle weakness (generalized) (M62.81)   Activity Tolerance Patient tolerated treatment well   Patient Left in chair;with call bell/phone within reach;with chair alarm set   Nurse Communication Mobility status        Time: 1610-9604 OT Time Calculation (min): 13 min  Charges: OT General Charges $OT Visit: 1 Visit OT Treatments $Self Care/Home Management : 8-22 mins  Rosalio Loud, MS Acute Rehabilitation Department Office# [phone]   Selinda Flavin 09/04/2022, 1:01 PM

## 2022-09-04 NOTE — Plan of Care (Signed)
Problem: Activity: Goal: Ability to avoid complications of mobility impairment will improve Outcome: Progressing   Problem: Clinical Measurements: Goal: Postoperative complications will be avoided or minimized Outcome: Progressing   Problem: Pain Management: Goal: Pain level will decrease with appropriate interventions Outcome: Progressing   Problem: Coping: Goal: Level of anxiety will decrease Outcome: Progressing   Haydee Salter, RN 09/04/22 7:58 AM

## 2022-09-04 NOTE — Progress Notes (Signed)
Physical Therapy Treatment Patient Details Name: Brandon Mendoza MRN: [id number] DOB: 10-23-1940 Today's Date: 09/04/2022   History of Present Illness Pt is 82 yo male admitted on 08/30/22 with recurrent R quadriceps tendon rupture and is now s/p revision of R quadriceps tendon repair and application of long leg splint.  Pt initially with quad tendon rupture on 07/09/22 with repair on 07/12/22.  Pt with hx including but not limited to COPD, macular degeneration, spinal stenosis, lumbar fusion, CVA, CKD, afib, neuropathy    PT Comments    Progressing, gait stability and activity tolerance improving. Continue PT POC   Recommendations for follow up therapy are one component of a multi-disciplinary discharge planning process, led by the attending physician.  Recommendations may be updated based on patient status, additional functional criteria and insurance authorization.  Follow Up Recommendations       Assistance Recommended at Discharge Frequent or constant Supervision/Assistance  Patient can return home with the following Assistance with cooking/housework;Help with stairs or ramp for entrance;A little help with walking and/or transfers;A little help with bathing/dressing/bathroom   Equipment Recommendations  None recommended by PT    Recommendations for Other Services       Precautions / Restrictions Precautions Precautions: Fall Required Braces or Orthoses: Knee Immobilizer - Right;Other Brace Knee Immobilizer - Right: On at all times Splint/Cast: R KI + R  cast shoe Restrictions RLE Weight Bearing: Weight bearing as tolerated Other Position/Activity Restrictions: Per Ortho note dated 09/02/22 now WBAT with KI and cast shoe     Mobility  Bed Mobility   Bed Mobility: Supine to Sit     Supine to sit: Supervision, Modified independent (Device/Increase time)     General bed mobility comments: good effort with bed mobility, attempted to don post op shoe in long sitting, needed min  assist    Transfers Overall transfer level: Needs assistance Equipment used: Rolling walker (2 wheels) Transfers: Sit to/from Stand Sit to Stand: Supervision, From elevated surface           General transfer comment: verbal  cues for proper hand placement and RLE position; no LOB or assist to steady on standing    Ambulation/Gait Ambulation/Gait assistance: Min guard, Supervision Gait Distance (Feet): 85 Feet Assistive device: Rolling walker (2 wheels) Gait Pattern/deviations: Decreased stance time - right, Trunk flexed, Wide base of support       General Gait Details: worked on [employer] especially with turns. pt  min-guard, slightly unsteady however no overt LOB, requiring intermittent min assist for balance; cues for RW position and safety   Stairs             Wheelchair Mobility    Modified Rankin (Stroke Patients Only)       Balance   Sitting-balance support: No upper extremity supported Sitting balance-Leahy Scale: Good     Standing balance support: Bilateral upper extremity supported, Reliant on assistive device for balance Standing balance-Leahy Scale: Poor                              Cognition Arousal/Alertness: Awake/alert Behavior During Therapy: WFL for tasks assessed/performed Overall Cognitive Status: Within Functional Limits for tasks assessed                                 General Comments: Oriented x4, able to follow commands.  Pleasant/motivated.  Lives home with Spouse  but she Physically can not asisst due to Hx back surgery        Exercises General Exercises - Lower Extremity Ankle Circles/Pumps: AROM, Both, 10 reps    General Comments        Pertinent Vitals/Pain Pain Assessment Pain Assessment: No/denies pain    Home Living                          Prior Function            PT Goals (current goals can now be found in the care plan section) Acute Rehab PT Goals Patient Stated  Goal: heal R LE PT Goal Formulation: With patient Time For Goal Achievement: 09/14/22 Potential to Achieve Goals: Good Progress towards PT goals: Progressing toward goals    Frequency    7X/week      PT Plan Current plan remains appropriate    Co-evaluation              AM-PAC PT "6 Clicks" Mobility   Outcome Measure  Help needed turning from your back to your side while in a flat bed without using bedrails?: A Little Help needed moving from lying on your back to sitting on the side of a flat bed without using bedrails?: A Little Help needed moving to and from a bed to a chair (including a wheelchair)?: A Little Help needed standing up from a chair using your arms (e.g., wheelchair or bedside chair)?: A Little Help needed to walk in hospital room?: A Little Help needed climbing 3-5 steps with a railing? : A Little 6 Click Score: 18    End of Session Equipment Utilized During Treatment: Gait belt;Right knee immobilizer;Other (comment) (R post op shoe) Activity Tolerance: Patient tolerated treatment well Patient left: with call bell/phone within reach;in bed;with bed alarm set Nurse Communication: Mobility status PT Visit Diagnosis: Other abnormalities of gait and mobility (R26.89);Muscle weakness (generalized) (M62.81)     Time: 6578-4696 PT Time Calculation (min) (ACUTE ONLY): 11 min  Charges:  $Gait Training: 8-22 mins                     Delice Bison, PT  Acute Rehab Dept [facility]) [phone]  09/04/2022    [facility] 09/04/2022, 10:49 AM

## 2022-09-04 NOTE — Progress Notes (Signed)
   Subjective: 5 Days Post-Op Procedure(s) (LRB): REPAIR QUADRICEP TENDON (Right) Patient reports pain as mild.   Patient seen in rounds for Dr. Charlann Boxer. Patient is doing well this AM. No issues overnight.   Objective: Vital signs in last 24 hours: Temp:  [97.7 F (36.5 C)-98.8 F (37.1 C)] 98.2 F (36.8 C) (06/09 0644) Pulse Rate:  [67-77] 67 (06/09 0644) Resp:  [17-18] 17 (06/09 0644) BP: (109-131)/(61-71) 131/71 (06/09 0644) SpO2:  [95 %-97 %] 97 % (06/09 0644) FiO2 (%):  [21 %] 21 % (06/08 2306)  Intake/Output from previous day:  Intake/Output Summary (Last 24 hours) at 09/04/2022 0901 Last data filed at 09/04/2022 0600 Gross per 24 hour  Intake 1080 ml  Output 1500 ml  Net -420 ml    Intake/Output this shift: No intake/output data recorded.  Labs: No results for input(s): "HGB" in the last 72 hours.  No results for input(s): "WBC", "RBC", "HCT", "PLT" in the last 72 hours.  No results for input(s): "NA", "K", "CL", "CO2", "BUN", "CREATININE", "GLUCOSE", "CALCIUM" in the last 72 hours. No results for input(s): "LABPT", "INR" in the last 72 hours.  Exam: General - Patient is Alert and Oriented Extremity - Neurologically intact Neurovascular intact Sensation intact distally Dorsiflexion/Plantar flexion intact Dressing/Incision - Bulky dressing with KI in place Motor Function - intact, moving foot and toes well on exam.   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   The spinal cord appears normal. 2.   No spinal stenosis. 3.   Mild multilevel degenerative changes as detailed above that do not lead to spinal stenosis or nerve root compression. 4.   T2 hyperintense foci within the pons consistent with chronic microvascular ischemic changes.   Loop - Medtronic Linq 10/22/2020 10/22/2020   Nocturia    Paroxysmal atrial fibrillation (HCC)    Peripheral neuropathy    Peripheral vascular disease (HCC)    Sleep apnea    on 6 cm, nasal pillow   Spinal stenosis    Stroke (HCC) 05/01/2011   ischemic   Weakness generalized 10/21/2021   Severe, legs and arms    Assessment/Plan: 5 Days Post-Op Procedure(s) (LRB): REPAIR QUADRICEP TENDON (Right) Principal Problem:   Quadriceps tendon rupture, right, subsequent encounter  Estimated body mass index is 26.58 kg/m as calculated from the following:   Height as of this encounter: 5\' 9"  (1.753 m).   Weight as of this encounter: 81.6 kg. Up with therapy  Continue mobilizing with therapy today  Netta Cedars, MD Orthopaedic Surgery [facility]

## 2022-09-05 DIAGNOSIS — S76111A Strain of right quadriceps muscle, fascia and tendon, initial encounter: Secondary | ICD-10-CM | POA: Diagnosis not present

## 2022-09-05 MED ORDER — OXYCODONE HCL 5 MG PO TABS: 5.0000 mg | ORAL_TABLET | Freq: Four times a day (QID) | ORAL | 0 refills | Status: DC | PRN

## 2022-09-05 MED ORDER — ASPIRIN 81 MG PO CHEW: 81.0000 mg | CHEWABLE_TABLET | Freq: Two times a day (BID) | ORAL | 0 refills | Status: AC

## 2022-09-05 NOTE — Progress Notes (Signed)
Subjective: 6 Days Post-Op Procedure(s) (LRB): REPAIR QUADRICEP TENDON (Right) Patient reports pain as mild.   Patient seen in rounds for Dr. Charlann Boxer. Patient is well, and has had no acute complaints or problems. No acute events overnight. He feels ready to go home today.  We will continue therapy today.   Objective: Vital signs in last 24 hours: Temp:  [97.6 F (36.4 C)-98.3 F (36.8 C)] 97.7 F (36.5 C) (06/10 0448) Pulse Rate:  [64-86] 64 (06/10 0448) Resp:  [16-18] 17 (06/10 0448) BP: (119-162)/(63-86) 140/72 (06/10 0448) SpO2:  [96 %-99 %] 96 % (06/10 0448) FiO2 (%):  [21 %] 21 % (06/09 2216)  Intake/Output from previous day:  Intake/Output Summary (Last 24 hours) at 09/05/2022 1008 Last data filed at 09/05/2022 0600 Gross per 24 hour  Intake 480 ml  Output 1700 ml  Net -1220 ml     Intake/Output this shift: No intake/output data recorded.  Labs: No results for input(s): "HGB" in the last 72 hours. No results for input(s): "WBC", "RBC", "HCT", "PLT" in the last 72 hours. No results for input(s): "NA", "K", "CL", "CO2", "BUN", "CREATININE", "GLUCOSE", "CALCIUM" in the last 72 hours. No results for input(s): "LABPT", "INR" in the last 72 hours.  Exam: General - Patient is Alert and Oriented Extremity - Neurologically intact Sensation intact distally Intact pulses distally Dorsiflexion/Plantar flexion intact Dressing - dressing C/D/I Motor Function - intact, moving foot and toes well on exam.   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   The spinal cord appears normal. 2.   No spinal stenosis. 3.   Mild multilevel degenerative changes as detailed above that do not lead to spinal stenosis or nerve root compression. 4.   T2 hyperintense foci within the pons consistent with chronic microvascular ischemic changes.   Loop - Medtronic Linq 10/22/2020 10/22/2020   Nocturia    Paroxysmal atrial fibrillation (HCC)    Peripheral neuropathy    Peripheral vascular disease (HCC)    Sleep apnea    on 6 cm, nasal pillow   Spinal stenosis    Stroke (HCC) 05/01/2011   ischemic   Weakness generalized 10/21/2021   Severe, legs and arms    Assessment/Plan: 6 Days Post-Op Procedure(s) (LRB): REPAIR QUADRICEP TENDON (Right) Principal Problem:   Quadriceps tendon rupture, right, subsequent encounter  Estimated body mass index is 26.58 kg/m as calculated from the following:   Height as of this encounter: 5\' 9"  (1.753 m).   Weight as of this encounter: 81.6 kg. Advance diet Up with therapy D/C IV fluids   DVT Prophylaxis - Aspirin Weight bearing as tolerated. Splint and KI for support  Plan is to go Home after hospital stay.  Plan for discharge today following 1-2 sessions of PT as long as they are meeting their goals.    Follow up in the office in about a week.   Dennie Bible, PA-C Orthopedic Surgery [phone] 09/05/2022, 10:08 AM

## 2022-09-05 NOTE — Progress Notes (Signed)
Physical Therapy Treatment Patient Details Name: Brandon Mendoza MRN: [id number] DOB: 1940/07/14 Today's Date: 09/05/2022   History of Present Illness Pt is 82 yo male admitted on 08/30/22 with recurrent R quadriceps tendon rupture and is now s/p revision of R quadriceps tendon repair and application of long leg splint.  Pt initially with quad tendon rupture on 07/09/22 with repair on 07/12/22.  Pt with hx including but not limited to COPD, macular degeneration, spinal stenosis, lumbar fusion, CVA, CKD, afib, neuropathy    PT Comments    Pt continues to progress. Areas addressed as below with emphasis on balance/static stand and safety with gait/transfers. Pt continues to demonstrate some truncal ataxia/weakness and will benefit from continued therapy post acute. Per pt report he is unable to return to SNF d/t obs status, pt is still obs after 6days in hospital. He continues to be at risk for falls and plans to self pay for rehab if he cannot manage at home.  Pt feels ready to d/c today. Brother in law will be assisting him with care transfer and stairs at home.  RN aware  Recommendations for follow up therapy are one component of a multi-disciplinary discharge planning process, led by the attending physician.  Recommendations may be updated based on patient status, additional functional criteria and insurance authorization.  Follow Up Recommendations       Assistance Recommended at Discharge Frequent or constant Supervision/Assistance  Patient can return home with the following Assistance with cooking/housework;Help with stairs or ramp for entrance;A little help with walking and/or transfers;A little help with bathing/dressing/bathroom   Equipment Recommendations  None recommended by PT    Recommendations for Other Services       Precautions / Restrictions Precautions Precautions: Fall Required Braces or Orthoses: Knee Immobilizer - Right;Other Brace Knee Immobilizer - Right: On at all  times Splint/Cast: R KI + R  cast shoe Restrictions Weight Bearing Restrictions: No RLE Weight Bearing: Weight bearing as tolerated Other Position/Activity Restrictions: Per Ortho note dated 09/02/22 now WBAT with KI and cast shoe     Mobility  Bed Mobility   Bed Mobility: Supine to Sit     Supine to sit: Supervision     General bed mobility comments: good effort with bed mobility, uses gait belt as leg lifter, incr time needed. cues to self assist. plans to sleep in recliner    Transfers Overall transfer level: Needs assistance Equipment used: Rolling walker (2 wheels)   Sit to Stand: Supervision, Min guard           General transfer comment: repeated STS from recliner x 5. no LOB or no physical assist to steady on standing.    Ambulation/Gait Ambulation/Gait assistance: Min guard, Supervision Gait Distance (Feet): 85 Feet Assistive device: Rolling walker (2 wheels) Gait Pattern/deviations: Decreased stance time - right Gait velocity: decreased     General Gait Details: good progression, consistent with gait pattern; no LOB, cues for safety with turns.  supervision to min/guard for safety   Stairs             Wheelchair Mobility    Modified Rankin (Stroke Patients Only)       Balance Overall balance assessment: Needs assistance Sitting-balance support: No upper extremity supported Sitting balance-Leahy Scale: Good     Standing balance support: Bilateral upper extremity supported, Reliant on assistive device for balance Standing balance-Leahy Scale: Fair Standing balance comment: able to static stand without UE support for ~ 10 seconds, no postural sway noted;  static stand with light UE support on RW and min perturbations given all directions, no LOB                            Cognition Arousal/Alertness: Awake/alert Behavior During Therapy: WFL for tasks assessed/performed Overall Cognitive Status: Within Functional Limits for tasks  assessed                                 General Comments: Oriented x4, able to follow commands.  Pleasant/motivated.  Lives home with Spouse but she Physically can not asisst due to Hx back surgery        Exercises      General Comments        Pertinent Vitals/Pain Pain Assessment Pain Assessment: No/denies pain    Home Living                          Prior Function            PT Goals (current goals can now be found in the care plan section) Acute Rehab PT Goals Patient Stated Goal: heal R LE PT Goal Formulation: With patient Time For Goal Achievement: 09/14/22 Potential to Achieve Goals: Good Progress towards PT goals: Progressing toward goals    Frequency    7X/week      PT Plan Current plan remains appropriate    Co-evaluation              AM-PAC PT "6 Clicks" Mobility   Outcome Measure  Help needed turning from your back to your side while in a flat bed without using bedrails?: A Little Help needed moving from lying on your back to sitting on the side of a flat bed without using bedrails?: A Little Help needed moving to and from a bed to a chair (including a wheelchair)?: A Little Help needed standing up from a chair using your arms (e.g., wheelchair or bedside chair)?: A Little Help needed to walk in hospital room?: A Little Help needed climbing 3-5 steps with a railing? : A Little 6 Click Score: 18    End of Session Equipment Utilized During Treatment: Gait belt;Right knee immobilizer;Other (comment) (R post op shoe) Activity Tolerance: Patient tolerated treatment well Patient left: with call bell/phone within reach;in chair;with chair alarm set Nurse Communication: Mobility status PT Visit Diagnosis: Other abnormalities of gait and mobility (R26.89);Muscle weakness (generalized) (M62.81)     Time: 1610-9604 PT Time Calculation (min) (ACUTE ONLY): 31 min  Charges:  $Gait Training: 23-37 mins                      Levy Cedano, PT  Acute Rehab Dept ([facility]) [phone]  09/05/2022    [facility] 09/05/2022, 11:29 AM

## 2022-09-05 NOTE — Plan of Care (Signed)
  Problem: Coping: Goal: Level of anxiety will decrease Outcome: Progressing   Problem: Pain Managment: Goal: General experience of comfort will improve Outcome: Progressing   Problem: Safety: Goal: Ability to remain free from injury will improve Outcome: Progressing   

## 2022-09-05 NOTE — TOC Transition Note (Signed)
Transition of Care [facility]) - CM/SW Discharge Note   Patient Details  Name: Brandon Mendoza MRN: [id number] Date of Birth: 1940-05-23  Transition of [facility]) CM/SW Contact:  Amada Jupiter, LCSW Phone Number: 09/05/2022, 11:44 AM   Clinical Narrative:     Pt medically cleared for dc home today.  Have reviewed with pt that HHPT will resume with [facility].  No DME needs.  Brother-in-law to provide dc transportation.  No further TOC needs.  Final next level of care: Home w Home Health Services Barriers to Discharge: Barriers Resolved   Patient Goals and CMS Choice      Discharge Placement                         Discharge Plan and Services Additional resources added to the After Visit Summary for   In-house Referral: Clinical Social Work              DME Arranged: N/A DME Agency: NA       HH Arranged: PT HH Agency: [facility] Date HH Agency Contacted: 09/05/22 Time HH Agency Contacted: 1144 Representative spoke with at [facility] Agency: Morrie Sheldon  Social Determinants of Health (SDOH) Interventions SDOH Screenings   Food Insecurity: No Food Insecurity (08/30/2022)  Housing: Low Risk  (08/30/2022)  Transportation Needs: No Transportation Needs (08/30/2022)  Utilities: Not At Risk (08/30/2022)  Depression (PHQ2-9): Low Risk  (12/10/2021)  Tobacco Use: Medium Risk (08/31/2022)     Readmission Risk Interventions    07/09/2022    1:44 PM  Readmission Risk Prevention Plan  Transportation Screening Complete  PCP or Specialist Appt within 5-7 Days Complete  Home Care Screening Complete  Medication Review (RN CM) Complete

## 2022-09-07 ENCOUNTER — Telehealth: Payer: Self-pay | Admitting: Internal Medicine

## 2022-09-07 NOTE — Telephone Encounter (Signed)
Home Health Verbal Orders  Agency:  [facility]  Caller:  Louanne Belton and title  Requesting OT/ PT/ Skilled nursing/ Social Work/ Speech:    Reason for Request:  Verbal order 2 times a week for 4 weeks, 1 a week for 4 weeks for PT  Frequency:    HH needs F2F w/in last 30 days      [phone] Confidential VM if needed

## 2022-09-12 ENCOUNTER — Telehealth: Payer: Self-pay | Admitting: Internal Medicine

## 2022-09-12 NOTE — Telephone Encounter (Signed)
Prescription Request  09/12/2022  LOV: 07/08/2022  What is the name of the medication or equipment?  DULoxetine (CYMBALTA) 30 MG capsule  (Requests 90 day supply)  Have you contacted your pharmacy to request a refill? Yes   Which pharmacy would you like this sent to?  [facility]- Ph# [phone]  Patient notified that their request is being sent to the clinical staff for review and that they should receive a response within 2 business days.   Please advise at [facility] [phone]

## 2022-09-13 NOTE — Telephone Encounter (Signed)
Is it okay to give verbal ?

## 2022-09-14 ENCOUNTER — Other Ambulatory Visit: Payer: Self-pay

## 2022-09-14 DIAGNOSIS — F32A Depression, unspecified: Secondary | ICD-10-CM

## 2022-09-14 MED ORDER — DULOXETINE HCL 30 MG PO CPEP
30.0000 mg | ORAL_CAPSULE | Freq: Every day | ORAL | 3 refills | Status: DC
Start: 2022-09-14 — End: 2023-10-24

## 2022-09-14 NOTE — Telephone Encounter (Signed)
Sent in refill of Cymbalta to requested pharmacy. Advised patient to taper off of sertraline and continue with Cymbalta only per Dr. Kandra Nicolas advice.

## 2022-09-14 NOTE — Telephone Encounter (Signed)
Forwarding not at this office

## 2022-09-14 NOTE — Telephone Encounter (Signed)
Spoke with patient concerning this and he informed me that PT is already set up to come to his home.

## 2022-09-19 NOTE — Discharge Summary (Signed)
Patient ID: Brandon Mendoza MRN: [id number] DOB/AGE: Dec 03, 1940 82 y.o.  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   The spinal cord appears normal. 2.   No spinal stenosis. 3.   Mild multilevel degenerative changes as detailed above that do not lead to spinal stenosis or nerve root compression. 4.   T2 hyperintense foci within the pons consistent with chronic microvascular ischemic changes.   Loop - Medtronic Linq 10/22/2020 10/22/2020   Nocturia    Paroxysmal atrial fibrillation (HCC)    Peripheral neuropathy    Peripheral vascular disease (HCC)    Sleep apnea    on 6 cm, nasal pillow   Spinal stenosis    Stroke (HCC) 05/01/2011   ischemic   Weakness generalized 10/21/2021   Severe, legs and arms    Surgeries: Procedure(s): REPAIR QUADRICEP TENDON on 08/30/2022   Consultants:   Discharged Condition: Improved  Hospital Course: Brandon Mendoza is an 82 y.o. male who was admitted 08/30/2022 for operative treatment ofQuadriceps tendon rupture, right, subsequent encounter. Patient has severe unremitting pain that affects  sleep, daily activities, and work/hobbies. After pre-op clearance the patient was taken to the operating room on 08/30/2022 and underwent  Procedure(s): REPAIR QUADRICEP TENDON.    Patient was given perioperative antibiotics:  Anti-infectives (From admission, onward)    Start     Dose/Rate Route Frequency Ordered Stop   08/30/22 2000  ceFAZolin (ANCEF) IVPB 2g/100 mL premix        2 g 200 mL/hr over 30 Minutes Intravenous Every 6 hours 08/30/22 1702 08/31/22 0307   08/30/22 1115  ceFAZolin (ANCEF) IVPB 2g/100 mL premix        2 g 200 mL/hr over 30 Minutes Intravenous On call to O.R. 08/30/22 1112 08/30/22 1356        Patient was given sequential compression devices, early ambulation, and chemoprophylaxis to prevent DVT. Patient worked with PT over multiple days to improve safe mobility and eventually was meeting their goals regarding safe ambulation and transfers.  Patient benefited maximally from hospital stay and there were no complications.    Recent vital signs: No data found.   Recent laboratory studies: No results for input(s): "WBC", "HGB", "HCT", "PLT", "NA", "K", "CL", "CO2", "BUN", "CREATININE", "GLUCOSE", "INR", "CALCIUM" in the last 72 hours.  Invalid input(s): "PT", "2"   Discharge Medications:   Allergies as of 09/05/2022   No Known Allergies      Medication List     STOP taking these medications    aspirin EC  81 MG tablet Replaced by: aspirin 81 MG chewable tablet       TAKE these medications    albuterol 108 (90 Base) MCG/ACT inhaler Commonly known as: VENTOLIN HFA Inhale 1-2 puffs into the lungs every 6 (six) hours as needed for wheezing or shortness of breath. What changed: how much to take   aspirin 81 MG chewable tablet Chew 1 tablet (81 mg total) by mouth 2 (two) times daily for 28 days. Replaces: aspirin EC 81 MG tablet   bisacodyl 5 MG EC tablet Commonly known as: DULCOLAX Take 2 tablets (10 mg total) by mouth daily.   docusate sodium  100 MG capsule Commonly known as: COLACE Take 1 capsule (100 mg total) by mouth 2 (two) times daily.   flecainide 100 MG tablet Commonly known as: TAMBOCOR Take 1 tablet (100 mg total) by mouth 2 (two) times daily.   losartan 25 MG tablet Commonly known as: COZAAR Take 1 tablet (25 mg total) by mouth daily.   losartan-hydrochlorothiazide 50-12.5 MG tablet Commonly known as: HYZAAR Take 1 tablet by mouth in the morning.   methocarbamol 500 MG tablet Commonly known as: ROBAXIN Take 1 tablet (500 mg total) by mouth every 6 (six) hours as needed for muscle spasms. What changed: when to take this   multivitamin with minerals Tabs tablet Take 1 tablet by mouth in the morning.   ondansetron 4 MG tablet Commonly known as: ZOFRAN Take 1 tablet (4 mg total) by mouth every 6 (six) hours as needed for nausea.   oxyCODONE 5 MG immediate release tablet Commonly known as: Oxy IR/ROXICODONE Take 1 tablet (5 mg total) by mouth every 6 (six) hours as needed for severe pain. What changed: when to take this   polyethylene glycol 17 g packet Commonly known as: MIRALAX / GLYCOLAX Take 17 g by mouth 2 (two) times daily. What changed:  when to take this reasons to take this   rosuvastatin 10 MG tablet Commonly known as: CRESTOR Take 10 mg by mouth at bedtime.   rosuvastatin 20 MG tablet Commonly known as: CRESTOR Take 0.5 tablets (10 mg total) by mouth at bedtime.   senna-docusate 8.6-50 MG tablet Commonly known as: Senokot-S Take 2 tablets by mouth at bedtime.   sertraline 50 MG tablet Commonly known as: ZOLOFT Take 1 tablet (50 mg total) by mouth daily. Use to taper off sertraline slowly over several months, if tolerated. Be aware anxiety and irritability may increase and you might have to stay on this        Diagnostic Studies: No results found.  Disposition: Discharge disposition: 01-Home or Self Care       Discharge Instructions     Call MD / Call 911   Complete  by: As directed    If you experience chest pain or shortness of breath, CALL 911 and be transported to the hospital emergency room.  If you develope a fever above 101 F, pus (white drainage) or increased drainage or redness at the wound, or calf pain, call your surgeon's office.   Constipation Prevention   Complete by: As directed    Drink plenty of fluids.  Prune juice may be helpful.  You may use a stool softener, such as Colace (over the counter) 100 mg twice a day.  Use MiraLax (over the counter) for constipation as needed.   Diet - low sodium heart healthy   Complete by: As directed    Increase activity slowly as tolerated   Complete by:  As directed    Weight bearing as tolerated with assist device (walker, cane, etc) as directed, use it as long as suggested by your surgeon or therapist, typically at least 4-6 weeks.   Post-operative opioid taper instructions:   Complete by: As directed    POST-OPERATIVE OPIOID TAPER INSTRUCTIONS: It is important to wean off of your opioid medication as soon as possible. If you do not need pain medication after your surgery it is ok to stop day one. Opioids include: Codeine, Hydrocodone(Norco, Vicodin), Oxycodone(Percocet, oxycontin) and hydromorphone amongst others.  Long term and even short term use of opiods can cause: Increased pain response Dependence Constipation Depression Respiratory depression And more.  Withdrawal symptoms can include Flu like symptoms Nausea, vomiting And more Techniques to manage these symptoms Hydrate well Eat regular healthy meals Stay active Use relaxation techniques(deep breathing, meditating, yoga) Do Not substitute Alcohol to help with tapering If you have been on opioids for less than two weeks and do not have pain than it is ok to stop all together.  Plan to wean off of opioids This plan should start within one week post op of your joint replacement. Maintain the same interval or time between taking each  dose and first decrease the dose.  Cut the total daily intake of opioids by one tablet each day Next start to increase the time between doses. The last dose that should be eliminated is the evening dose.           Follow-up Information     Durene Romans, MD. Schedule an appointment as soon as possible for a visit in 2 week(s).   Specialty: Orthopedic Surgery Contact information: [address] [place] [address] [phone]                  Signed: Cassandria Anger 09/19/2022, 1:41 PM

## 2022-09-20 ENCOUNTER — Telehealth: Payer: Self-pay | Admitting: Internal Medicine

## 2022-09-20 NOTE — Telephone Encounter (Signed)
Beth, PTA w/ [facility] called to report a fall that patient experienced today. Caller states patient lost balance while navigating his walker and fell into his chair. Patient's right knee bent causing slight pain since he has on a splint and immobilizer but pain resolved quickly. States patient didn't have any injuries but she was informed by medicare to report.   Waynetta Sandy can be contacted at [phone].

## 2022-09-23 NOTE — Telephone Encounter (Signed)
Spoke with patient concerning this, he informed me that he is recovering from a second surgery repair of a torn tendon in his knee. He stated that he has been careful not to bend his knee with any falls and he is okay for now. He appreciates [place] following up and checking on him.

## 2022-10-03 ENCOUNTER — Telehealth: Payer: Self-pay | Admitting: Internal Medicine

## 2022-10-03 NOTE — Telephone Encounter (Signed)
Home Health Verbal Orders  Agency:  [facility]  Caller:  Beth  Reason for call:  Post op ortho - he had a fall, lost balance and fell backwards  Frequency:    HH needs F2F w/in last 30 days     [phone]

## 2022-10-05 ENCOUNTER — Ambulatory Visit: Payer: Medicare Other | Admitting: Cardiology

## 2022-10-05 NOTE — Telephone Encounter (Signed)
Spoke with patient and he said he's fine as far as the fall. He declined an OV.

## 2022-10-07 ENCOUNTER — Inpatient Hospital Stay (HOSPITAL_COMMUNITY)
Admission: EM | Admit: 2022-10-07 | Discharge: 2022-10-18 | DRG: 921 | Disposition: A | Discharge status: Skilled Nursing Facility | Payer: Medicare Other | Attending: Orthopedic Surgery | Admitting: Orthopedic Surgery

## 2022-10-07 ENCOUNTER — Other Ambulatory Visit: Payer: Self-pay

## 2022-10-07 ENCOUNTER — Encounter (HOSPITAL_COMMUNITY)
Admission: EM | Disposition: A | Discharge status: Skilled Nursing Facility | Payer: Self-pay | Source: Home / Self Care | Attending: Orthopedic Surgery

## 2022-10-07 ENCOUNTER — Emergency Department (HOSPITAL_COMMUNITY): Payer: Medicare Other

## 2022-10-07 ENCOUNTER — Encounter (HOSPITAL_COMMUNITY): Payer: Self-pay

## 2022-10-07 ENCOUNTER — Emergency Department (HOSPITAL_COMMUNITY): Payer: Medicare Other | Admitting: Anesthesiology

## 2022-10-07 DIAGNOSIS — G4730 Sleep apnea, unspecified: Secondary | ICD-10-CM | POA: Diagnosis present

## 2022-10-07 DIAGNOSIS — Z8673 Personal history of transient ischemic attack (TIA), and cerebral infarction without residual deficits: Secondary | ICD-10-CM

## 2022-10-07 DIAGNOSIS — Z79899 Other long term (current) drug therapy: Secondary | ICD-10-CM | POA: Diagnosis not present

## 2022-10-07 DIAGNOSIS — I480 Paroxysmal atrial fibrillation: Secondary | ICD-10-CM | POA: Diagnosis present

## 2022-10-07 DIAGNOSIS — Z87891 Personal history of nicotine dependence: Secondary | ICD-10-CM | POA: Diagnosis not present

## 2022-10-07 DIAGNOSIS — F419 Anxiety disorder, unspecified: Secondary | ICD-10-CM | POA: Diagnosis present

## 2022-10-07 DIAGNOSIS — Y92009 Unspecified place in unspecified non-institutional (private) residence as the place of occurrence of the external cause: Secondary | ICD-10-CM

## 2022-10-07 DIAGNOSIS — Z7982 Long term (current) use of aspirin: Secondary | ICD-10-CM

## 2022-10-07 DIAGNOSIS — T8130XA Disruption of wound, unspecified, initial encounter: Secondary | ICD-10-CM | POA: Diagnosis present

## 2022-10-07 DIAGNOSIS — N183 Chronic kidney disease, stage 3 unspecified: Secondary | ICD-10-CM | POA: Diagnosis present

## 2022-10-07 DIAGNOSIS — I129 Hypertensive chronic kidney disease with stage 1 through stage 4 chronic kidney disease, or unspecified chronic kidney disease: Secondary | ICD-10-CM | POA: Diagnosis present

## 2022-10-07 DIAGNOSIS — M21531 Acquired clawfoot, right foot: Secondary | ICD-10-CM | POA: Diagnosis present

## 2022-10-07 DIAGNOSIS — T8131XA Disruption of external operation (surgical) wound, not elsewhere classified, initial encounter: Secondary | ICD-10-CM | POA: Diagnosis present

## 2022-10-07 DIAGNOSIS — S76111A Strain of right quadriceps muscle, fascia and tendon, initial encounter: Secondary | ICD-10-CM | POA: Diagnosis present

## 2022-10-07 DIAGNOSIS — F32A Depression, unspecified: Secondary | ICD-10-CM | POA: Diagnosis present

## 2022-10-07 DIAGNOSIS — M2040 Other hammer toe(s) (acquired), unspecified foot: Secondary | ICD-10-CM | POA: Diagnosis not present

## 2022-10-07 DIAGNOSIS — W010XXA Fall on same level from slipping, tripping and stumbling without subsequent striking against object, initial encounter: Secondary | ICD-10-CM | POA: Diagnosis present

## 2022-10-07 DIAGNOSIS — M40209 Unspecified kyphosis, site unspecified: Secondary | ICD-10-CM | POA: Diagnosis present

## 2022-10-07 DIAGNOSIS — J449 Chronic obstructive pulmonary disease, unspecified: Secondary | ICD-10-CM | POA: Diagnosis present

## 2022-10-07 DIAGNOSIS — E785 Hyperlipidemia, unspecified: Secondary | ICD-10-CM | POA: Diagnosis present

## 2022-10-07 DIAGNOSIS — I739 Peripheral vascular disease, unspecified: Secondary | ICD-10-CM | POA: Diagnosis present

## 2022-10-07 DIAGNOSIS — Z86010 Personal history of colonic polyps: Secondary | ICD-10-CM

## 2022-10-07 DIAGNOSIS — G629 Polyneuropathy, unspecified: Secondary | ICD-10-CM | POA: Diagnosis present

## 2022-10-07 HISTORY — PX: I & D EXTREMITY: SHX5045

## 2022-10-07 LAB — CBC WITH DIFFERENTIAL/PLATELET
Abs Immature Granulocytes: 0.06 10*3/uL (ref 0.00–0.07)
Basophils Absolute: 0.1 10*3/uL (ref 0.0–0.1)
Basophils Relative: 1 %
Eosinophils Absolute: 0.5 10*3/uL (ref 0.0–0.5)
Eosinophils Relative: 4 %
HCT: 43.1 % (ref 39.0–52.0)
Hemoglobin: 14.6 g/dL (ref 13.0–17.0)
Immature Granulocytes: 1 %
Lymphocytes Relative: 7 %
Lymphs Abs: 0.8 10*3/uL (ref 0.7–4.0)
MCH: 30 pg (ref 26.0–34.0)
MCHC: 33.9 g/dL (ref 30.0–36.0)
MCV: 88.7 fL (ref 80.0–100.0)
Monocytes Absolute: 1.3 10*3/uL — ABNORMAL HIGH (ref 0.1–1.0)
Monocytes Relative: 11 %
Neutro Abs: 8.8 10*3/uL — ABNORMAL HIGH (ref 1.7–7.7)
Neutrophils Relative %: 76 %
Platelets: 271 10*3/uL (ref 150–400)
RBC: 4.86 MIL/uL (ref 4.22–5.81)
RDW: 13 % (ref 11.5–15.5)
WBC: 11.5 10*3/uL — ABNORMAL HIGH (ref 4.0–10.5)
nRBC: 0 % (ref 0.0–0.2)

## 2022-10-07 LAB — BASIC METABOLIC PANEL
Anion gap: 8 (ref 5–15)
BUN: 22 mg/dL (ref 8–23)
CO2: 22 mmol/L (ref 22–32)
Calcium: 8.4 mg/dL — ABNORMAL LOW (ref 8.9–10.3)
Chloride: 103 mmol/L (ref 98–111)
Creatinine, Ser: 1.02 mg/dL (ref 0.61–1.24)
GFR, Estimated: >60 mL/min (ref >60)
Glucose, Bld: 110 mg/dL — ABNORMAL HIGH (ref 70–99)
Potassium: 3.9 mmol/L (ref 3.5–5.1)
Sodium: 133 mmol/L — ABNORMAL LOW (ref 135–145)

## 2022-10-07 SURGERY — IRRIGATION AND DEBRIDEMENT EXTREMITY
Anesthesia: General | Site: Knee | Laterality: Right

## 2022-10-07 MED ORDER — LIDOCAINE HCL (CARDIAC) PF 100 MG/5ML IV SOSY
PREFILLED_SYRINGE | INTRAVENOUS | Status: DC | PRN
  Administered 2022-10-07: 100 mg via INTRAVENOUS

## 2022-10-07 MED ORDER — SERTRALINE HCL 25 MG PO TABS
25.0000 mg | ORAL_TABLET | Freq: Every day | ORAL | Status: DC
  Administered 2022-10-07 – 2022-10-18 (×12): 25 mg via ORAL
  Filled 2022-10-07 (×12): qty 1

## 2022-10-07 MED ORDER — SODIUM CHLORIDE 0.9 % IV SOLN: INTRAVENOUS | Status: DC

## 2022-10-07 MED ORDER — ONDANSETRON HCL 4 MG/2ML IJ SOLN: 4.0000 mg | Freq: Four times a day (QID) | INTRAMUSCULAR | Status: DC | PRN

## 2022-10-07 MED ORDER — LOSARTAN POTASSIUM 50 MG PO TABS
50.0000 mg | ORAL_TABLET | Freq: Every day | ORAL | Status: DC
  Administered 2022-10-08 – 2022-10-18 (×11): 50 mg via ORAL
  Filled 2022-10-07 (×11): qty 1

## 2022-10-07 MED ORDER — ACETAMINOPHEN 325 MG PO TABS
325.0000 mg | ORAL_TABLET | Freq: Four times a day (QID) | ORAL | Status: DC | PRN
  Administered 2022-10-10 – 2022-10-16 (×4): 650 mg via ORAL
  Filled 2022-10-07 (×7): qty 2

## 2022-10-07 MED ORDER — DOCUSATE SODIUM 100 MG PO CAPS
100.0000 mg | ORAL_CAPSULE | Freq: Two times a day (BID) | ORAL | Status: DC
  Administered 2022-10-07 – 2022-10-14 (×7): 100 mg via ORAL
  Filled 2022-10-07 (×20): qty 1

## 2022-10-07 MED ORDER — POVIDONE-IODINE 10 % EX SWAB
2.0000 | Freq: Once | CUTANEOUS | Status: AC
  Administered 2022-10-07: 2 via TOPICAL

## 2022-10-07 MED ORDER — SUGAMMADEX SODIUM 200 MG/2ML IV SOLN
INTRAVENOUS | Status: DC | PRN
  Administered 2022-10-07: 200 mg via INTRAVENOUS

## 2022-10-07 MED ORDER — BACITRACIN-NEOMYCIN-POLYMYXIN OINTMENT TUBE
TOPICAL_OINTMENT | CUTANEOUS | Status: DC | PRN
  Administered 2022-10-07: 1 via TOPICAL

## 2022-10-07 MED ORDER — METOCLOPRAMIDE HCL 5 MG PO TABS: 5.0000 mg | ORAL_TABLET | Freq: Three times a day (TID) | ORAL | Status: DC | PRN

## 2022-10-07 MED ORDER — DEXAMETHASONE SODIUM PHOSPHATE 4 MG/ML IJ SOLN
INTRAMUSCULAR | Status: DC | PRN
  Administered 2022-10-07: 5 mg via INTRAVENOUS

## 2022-10-07 MED ORDER — CHLORHEXIDINE GLUCONATE 0.12 % MT SOLN
15.0000 mL | Freq: Once | OROMUCOSAL | Status: AC
  Administered 2022-10-07: 15 mL via OROMUCOSAL

## 2022-10-07 MED ORDER — METOCLOPRAMIDE HCL 5 MG/ML IJ SOLN: 5.0000 mg | Freq: Three times a day (TID) | INTRAMUSCULAR | Status: DC | PRN

## 2022-10-07 MED ORDER — ACETAMINOPHEN 500 MG PO TABS
500.0000 mg | ORAL_TABLET | Freq: Four times a day (QID) | ORAL | Status: AC
  Administered 2022-10-07 – 2022-10-08 (×4): 500 mg via ORAL
  Filled 2022-10-07 (×4): qty 1

## 2022-10-07 MED ORDER — ACETAMINOPHEN 325 MG PO TABS
650.0000 mg | ORAL_TABLET | ORAL | Status: DC | PRN
  Administered 2022-10-10 – 2022-10-18 (×5): 650 mg via ORAL
  Filled 2022-10-07 (×3): qty 2

## 2022-10-07 MED ORDER — MORPHINE SULFATE (PF) 2 MG/ML IV SOLN
0.5000 mg | INTRAVENOUS | Status: DC | PRN
  Administered 2022-10-12: 1 mg via INTRAVENOUS
  Filled 2022-10-07: qty 1

## 2022-10-07 MED ORDER — ENOXAPARIN SODIUM 40 MG/0.4ML IJ SOSY
40.0000 mg | PREFILLED_SYRINGE | INTRAMUSCULAR | Status: DC
  Administered 2022-10-09 – 2022-10-10 (×2): 40 mg via SUBCUTANEOUS
  Filled 2022-10-07 (×2): qty 0.4

## 2022-10-07 MED ORDER — PHENYLEPHRINE HCL (PRESSORS) 10 MG/ML IV SOLN
INTRAVENOUS | Status: DC | PRN
  Administered 2022-10-07 (×4): 80 ug via INTRAVENOUS

## 2022-10-07 MED ORDER — ONDANSETRON HCL 4 MG PO TABS
4.0000 mg | ORAL_TABLET | Freq: Four times a day (QID) | ORAL | Status: DC | PRN
  Administered 2022-10-11: 4 mg via ORAL
  Filled 2022-10-07 (×5): qty 1

## 2022-10-07 MED ORDER — CEFAZOLIN SODIUM-DEXTROSE 1-4 GM/50ML-% IV SOLN
1.0000 g | Freq: Once | INTRAVENOUS | Status: AC
  Administered 2022-10-07: 1 g via INTRAVENOUS
  Filled 2022-10-07: qty 50

## 2022-10-07 MED ORDER — CEFAZOLIN SODIUM-DEXTROSE 2-4 GM/100ML-% IV SOLN
INTRAVENOUS | Status: AC
  Filled 2022-10-07: qty 100

## 2022-10-07 MED ORDER — CEFAZOLIN SODIUM-DEXTROSE 1-4 GM/50ML-% IV SOLN
1.0000 g | Freq: Four times a day (QID) | INTRAVENOUS | Status: AC
  Administered 2022-10-07 – 2022-10-08 (×3): 1 g via INTRAVENOUS
  Filled 2022-10-07 (×3): qty 50

## 2022-10-07 MED ORDER — HYDROCHLOROTHIAZIDE 12.5 MG PO TABS
12.5000 mg | ORAL_TABLET | Freq: Every day | ORAL | Status: DC
  Administered 2022-10-08 – 2022-10-18 (×11): 12.5 mg via ORAL
  Filled 2022-10-07 (×11): qty 1

## 2022-10-07 MED ORDER — PHENYLEPHRINE 80 MCG/ML (10ML) SYRINGE FOR IV PUSH (FOR BLOOD PRESSURE SUPPORT)
PREFILLED_SYRINGE | INTRAVENOUS | Status: AC
  Filled 2022-10-07: qty 10

## 2022-10-07 MED ORDER — ONDANSETRON HCL 4 MG/2ML IJ SOLN
INTRAMUSCULAR | Status: DC | PRN
  Administered 2022-10-07: 4 mg via INTRAVENOUS

## 2022-10-07 MED ORDER — PRONTOSAN WOUND IRRIGATION OPTIME
TOPICAL | Status: DC | PRN
  Administered 2022-10-07: 1 via TOPICAL

## 2022-10-07 MED ORDER — AMISULPRIDE (ANTIEMETIC) 5 MG/2ML IV SOLN: 10.0000 mg | Freq: Once | INTRAVENOUS | Status: DC | PRN

## 2022-10-07 MED ORDER — HYDROCODONE-ACETAMINOPHEN 5-325 MG PO TABS
1.0000 | ORAL_TABLET | ORAL | Status: DC | PRN
  Filled 2022-10-07 (×2): qty 2

## 2022-10-07 MED ORDER — ROSUVASTATIN CALCIUM 10 MG PO TABS
10.0000 mg | ORAL_TABLET | Freq: Every day | ORAL | Status: DC
  Administered 2022-10-07 – 2022-10-17 (×11): 10 mg via ORAL
  Filled 2022-10-07 (×11): qty 1

## 2022-10-07 MED ORDER — ACETAMINOPHEN 10 MG/ML IV SOLN
INTRAVENOUS | Status: AC
  Filled 2022-10-07: qty 100

## 2022-10-07 MED ORDER — HYDROCODONE-ACETAMINOPHEN 7.5-325 MG PO TABS
1.0000 | ORAL_TABLET | ORAL | Status: DC | PRN
  Administered 2022-10-12 – 2022-10-13 (×6): 1 via ORAL
  Administered 2022-10-14: 2 via ORAL
  Administered 2022-10-14: 1 via ORAL
  Filled 2022-10-07: qty 1
  Filled 2022-10-07 (×3): qty 2
  Filled 2022-10-07 (×4): qty 1

## 2022-10-07 MED ORDER — ACETAMINOPHEN 160 MG/5ML PO SOLN: 325.0000 mg | Freq: Once | ORAL | Status: DC | PRN

## 2022-10-07 MED ORDER — TRANEXAMIC ACID-NACL 1000-0.7 MG/100ML-% IV SOLN
INTRAVENOUS | Status: AC
  Filled 2022-10-07: qty 100

## 2022-10-07 MED ORDER — EPHEDRINE 5 MG/ML INJ
INTRAVENOUS | Status: AC
  Filled 2022-10-07: qty 5

## 2022-10-07 MED ORDER — ACETAMINOPHEN 325 MG PO TABS: 325.0000 mg | ORAL_TABLET | Freq: Once | ORAL | Status: DC | PRN

## 2022-10-07 MED ORDER — PROPOFOL 10 MG/ML IV BOLUS
INTRAVENOUS | Status: AC
  Filled 2022-10-07: qty 20

## 2022-10-07 MED ORDER — PROMETHAZINE HCL 25 MG/ML IJ SOLN: 6.2500 mg | INTRAMUSCULAR | Status: DC | PRN

## 2022-10-07 MED ORDER — TRANEXAMIC ACID-NACL 1000-0.7 MG/100ML-% IV SOLN
1000.0000 mg | INTRAVENOUS | Status: AC
  Administered 2022-10-07: 1000 mg via INTRAVENOUS

## 2022-10-07 MED ORDER — HYDROMORPHONE HCL 1 MG/ML IJ SOLN
INTRAMUSCULAR | Status: AC
  Administered 2022-10-07: 0.5 mg via INTRAVENOUS
  Filled 2022-10-07: qty 1

## 2022-10-07 MED ORDER — LACTATED RINGERS IV SOLN: INTRAVENOUS | Status: DC

## 2022-10-07 MED ORDER — ROCURONIUM BROMIDE 100 MG/10ML IV SOLN
INTRAVENOUS | Status: DC | PRN
  Administered 2022-10-07: 50 mg via INTRAVENOUS

## 2022-10-07 MED ORDER — DEXAMETHASONE SODIUM PHOSPHATE 10 MG/ML IJ SOLN
INTRAMUSCULAR | Status: AC
  Filled 2022-10-07: qty 1

## 2022-10-07 MED ORDER — HYDROMORPHONE HCL 1 MG/ML IJ SOLN
0.2500 mg | INTRAMUSCULAR | Status: DC | PRN
  Administered 2022-10-07 (×2): 0.25 mg via INTRAVENOUS

## 2022-10-07 MED ORDER — VANCOMYCIN HCL 1000 MG IV SOLR
INTRAVENOUS | Status: AC
  Filled 2022-10-07: qty 20

## 2022-10-07 MED ORDER — FENTANYL CITRATE (PF) 100 MCG/2ML IJ SOLN
INTRAMUSCULAR | Status: AC
  Filled 2022-10-07: qty 2

## 2022-10-07 MED ORDER — FENTANYL CITRATE PF 50 MCG/ML IJ SOSY
25.0000 ug | PREFILLED_SYRINGE | Freq: Once | INTRAMUSCULAR | Status: AC
  Administered 2022-10-07: 25 ug via INTRAVENOUS
  Filled 2022-10-07: qty 1

## 2022-10-07 MED ORDER — CHLORHEXIDINE GLUCONATE 4 % EX SOLN: 60.0000 mL | Freq: Once | CUTANEOUS | Status: DC

## 2022-10-07 MED ORDER — FLECAINIDE ACETATE 50 MG PO TABS
50.0000 mg | ORAL_TABLET | Freq: Two times a day (BID) | ORAL | Status: DC
  Administered 2022-10-07 – 2022-10-18 (×22): 50 mg via ORAL
  Filled 2022-10-07 (×23): qty 1

## 2022-10-07 MED ORDER — METHOCARBAMOL 1000 MG/10ML IJ SOLN: 500.0000 mg | Freq: Four times a day (QID) | INTRAVENOUS | Status: DC | PRN

## 2022-10-07 MED ORDER — SODIUM CHLORIDE 0.9 % IR SOLN
Status: DC | PRN
  Administered 2022-10-07 (×2): 3000 mL

## 2022-10-07 MED ORDER — ONDANSETRON HCL 4 MG PO TABS
4.0000 mg | ORAL_TABLET | Freq: Four times a day (QID) | ORAL | Status: DC | PRN
  Administered 2022-10-07 – 2022-10-18 (×5): 4 mg via ORAL
  Filled 2022-10-07: qty 1

## 2022-10-07 MED ORDER — BACITRACIN-NEOMYCIN-POLYMYXIN OINTMENT TUBE
TOPICAL_OINTMENT | CUTANEOUS | Status: AC
  Filled 2022-10-07: qty 14.17

## 2022-10-07 MED ORDER — PROPOFOL 10 MG/ML IV BOLUS
INTRAVENOUS | Status: DC | PRN
Start: 2022-10-07 — End: 2022-10-07
  Administered 2022-10-07: 50 mg via INTRAVENOUS
  Administered 2022-10-07: 150 mg via INTRAVENOUS
  Administered 2022-10-07: 50 mg via INTRAVENOUS

## 2022-10-07 MED ORDER — POLYETHYLENE GLYCOL 3350 17 G PO PACK
17.0000 g | PACK | Freq: Every day | ORAL | Status: DC
  Administered 2022-10-08 – 2022-10-18 (×11): 17 g via ORAL
  Filled 2022-10-07 (×12): qty 1

## 2022-10-07 MED ORDER — FENTANYL CITRATE (PF) 100 MCG/2ML IJ SOLN
INTRAMUSCULAR | Status: DC | PRN
  Administered 2022-10-07 (×2): 50 ug via INTRAVENOUS

## 2022-10-07 MED ORDER — ACETAMINOPHEN 10 MG/ML IV SOLN
1000.0000 mg | Freq: Once | INTRAVENOUS | Status: DC | PRN
  Administered 2022-10-07: 1000 mg via INTRAVENOUS

## 2022-10-07 MED ORDER — ONDANSETRON HCL 4 MG/2ML IJ SOLN
INTRAMUSCULAR | Status: AC
  Filled 2022-10-07: qty 2

## 2022-10-07 MED ORDER — CEFAZOLIN SODIUM-DEXTROSE 2-4 GM/100ML-% IV SOLN
2.0000 g | INTRAVENOUS | Status: AC
  Administered 2022-10-07: 2 g via INTRAVENOUS

## 2022-10-07 MED ORDER — LACTATED RINGERS IV SOLN: INTRAVENOUS | Status: DC | PRN

## 2022-10-07 MED ORDER — METHOCARBAMOL 500 MG PO TABS
500.0000 mg | ORAL_TABLET | Freq: Four times a day (QID) | ORAL | Status: DC | PRN
  Administered 2022-10-12 – 2022-10-18 (×6): 500 mg via ORAL
  Filled 2022-10-07 (×8): qty 1

## 2022-10-07 MED ORDER — LOSARTAN POTASSIUM-HCTZ 50-12.5 MG PO TABS: 1.0000 | ORAL_TABLET | Freq: Every morning | ORAL | Status: DC

## 2022-10-07 SURGICAL SUPPLY — 49 items
APL PRP STRL LF DISP 70% ISPRP (MISCELLANEOUS) ×1
BAG COUNTER SPONGE SURGICOUNT (BAG) IMPLANT
BAG SPEC THK2 15X12 ZIP CLS (MISCELLANEOUS) ×1
BAG SPNG CNTER NS LX DISP (BAG)
BAG ZIPLOCK 12X15 (MISCELLANEOUS) ×1 IMPLANT
BANDAGE ESMARK 6X9 LF (GAUZE/BANDAGES/DRESSINGS) ×1 IMPLANT
BNDG CMPR 9X6 STRL LF SNTH (GAUZE/BANDAGES/DRESSINGS) ×1
BNDG CMPR STD VLCR NS LF 5.8X4 (GAUZE/BANDAGES/DRESSINGS) ×1
BNDG ELASTIC 4X5.8 VLCR NS LF (GAUZE/BANDAGES/DRESSINGS) IMPLANT
BNDG ESMARK 6X9 LF (GAUZE/BANDAGES/DRESSINGS) ×1
BNDG GAUZE DERMACEA FLUFF 4 (GAUZE/BANDAGES/DRESSINGS) ×1 IMPLANT
BNDG GZE DERMACEA 4 6PLY (GAUZE/BANDAGES/DRESSINGS) ×1
CHLORAPREP W/TINT 26 (MISCELLANEOUS) ×1 IMPLANT
COVER SURGICAL LIGHT HANDLE (MISCELLANEOUS) ×1 IMPLANT
CUFF TOURN SGL QUICK 18X4 (TOURNIQUET CUFF) IMPLANT
CUFF TOURN SGL QUICK 24 (TOURNIQUET CUFF)
CUFF TOURN SGL QUICK 34 (TOURNIQUET CUFF)
CUFF TRNQT CYL 24X4X16.5-23 (TOURNIQUET CUFF) IMPLANT
CUFF TRNQT CYL 34X4.125X (TOURNIQUET CUFF) IMPLANT
DRAIN PENROSE 0.5X18 (DRAIN) ×1 IMPLANT
DRESSING PEEL AND PLAC PRVNA20 (GAUZE/BANDAGES/DRESSINGS) IMPLANT
DRSG ADAPTIC 3X8 NADH LF (GAUZE/BANDAGES/DRESSINGS) IMPLANT
DRSG PEEL AND PLACE PREVENA 20 (GAUZE/BANDAGES/DRESSINGS) ×1
ELECT REM PT RETURN 15FT ADLT (MISCELLANEOUS) ×1 IMPLANT
GAUZE PAD ABD 8X10 STRL (GAUZE/BANDAGES/DRESSINGS) ×2 IMPLANT
GAUZE SPONGE 4X4 12PLY STRL (GAUZE/BANDAGES/DRESSINGS) ×1 IMPLANT
GLOVE BIO SURGEON STRL SZ7 (GLOVE) ×1 IMPLANT
GLOVE ORTHO TXT STRL SZ7.5 (GLOVE) ×1 IMPLANT
GOWN STRL REUS W/ TWL XL LVL3 (GOWN DISPOSABLE) ×1 IMPLANT
GOWN STRL REUS W/TWL XL LVL3 (GOWN DISPOSABLE) ×1
HANDPIECE INTERPULSE COAX TIP (DISPOSABLE) ×1
IMMOBILIZER KNEE 20 (SOFTGOODS) ×1 IMPLANT
IMMOBILIZER KNEE 20 THIGH 36 (SOFTGOODS) IMPLANT
KIT BASIN OR (CUSTOM PROCEDURE TRAY) ×1 IMPLANT
KIT DRSG PREVENA PLUS 7DAY 125 (MISCELLANEOUS) IMPLANT
KIT TURNOVER KIT A (KITS) IMPLANT
MANIFOLD NEPTUNE II (INSTRUMENTS) ×1 IMPLANT
PACK ORTHO EXTREMITY (CUSTOM PROCEDURE TRAY) ×1 IMPLANT
PAD CAST 4YDX4 CTTN HI CHSV (CAST SUPPLIES) ×1 IMPLANT
PADDING CAST ABS COTTON 6X4 NS (CAST SUPPLIES) IMPLANT
PADDING CAST COTTON 4X4 STRL (CAST SUPPLIES) ×1
PENCIL SMOKE EVACUATOR (MISCELLANEOUS) IMPLANT
PROTECTOR NERVE ULNAR (MISCELLANEOUS) ×1 IMPLANT
SET HNDPC FAN SPRY TIP SCT (DISPOSABLE) ×1 IMPLANT
SUT BONE WAX W31G (SUTURE) ×1 IMPLANT
SWAB COLLECTION DEVICE MRSA (MISCELLANEOUS) ×1 IMPLANT
SWAB CULTURE ESWAB REG 1ML (MISCELLANEOUS) ×1 IMPLANT
SYR CONTROL 10ML LL (SYRINGE) ×1 IMPLANT
TOWEL OR 17X26 10 PK STRL BLUE (TOWEL DISPOSABLE) ×2 IMPLANT

## 2022-10-07 NOTE — Brief Op Note (Signed)
10/07/2022  7:00 PM  PATIENT:  Brandon Mendoza.  82 y.o. male  PRE-OPERATIVE DIAGNOSIS:  RIGHT KNEE DEHISCENCE  POST-OPERATIVE DIAGNOSIS:  RIGHT KNEE DEHISCENCE  PROCEDURE:  Procedure(s): IRRIGATION AND DEBRIDEMENT EXTREMITY WITH WOUND CLOSURE (Right)  SURGEON:  Surgeons and Role:    Venita Lick, MD - Primary  PHYSICIAN ASSISTANT:   ASSISTANTS: none   ANESTHESIA:   general  EBL: See anesthesia report  BLOOD ADMINISTERED:none  DRAINS: prevena external wound vac   LOCAL MEDICATIONS USED:  NONE  SPECIMEN: Aerobic and anaerobic cultures  DISPOSITION OF SPECIMEN:  PATHOLOGY  COUNTS:  YES  TOURNIQUET:  * No tourniquets in log *  DICTATION: .Dragon Dictation  PLAN OF CARE: Admit to inpatient   PATIENT DISPOSITION:  PACU - hemodynamically stable.   Debridement type: Excisional Debridement  Side: right  Body Location: knee  Tools used for debridement: scalpel and scissors  Pre-debridement Wound size (cm):   Length: 13 cm        Width: 5cm     Depth: probed to bone.   Post-debridement Wound size (cm):   Length: 13          Debridement depth beyond dead/damaged tissue down to healthy viable tissue: yes  Tissue layer involved: skin, subcutaneous tissue, muscle / fascia, bone  Nature of tissue removed: Devitalized Tissue  Irrigation volume: 6L pulse lavage.  1L  Abx irrigation  irricept   Irrigation fluid type: Normal Saline and irricept

## 2022-10-07 NOTE — ED Triage Notes (Signed)
Pt brought by [facility] after fall this morning. Pt has had 2 right quad repair surgeries recently with DR Charlann Boxer, orthopedist, with last one occurring 08/30/22. Pt was getting out of bed this morning to use urinal when his right leg "gave out" causing him to fall and surgical site incision to open. EMS reports that laceration/surgical site is full thickness and approx 7-8 cm. Pt has knee immobilizer on and gauze pressure dressing present. Bleeding controlled.

## 2022-10-07 NOTE — Anesthesia Procedure Notes (Signed)

## 2022-10-07 NOTE — Transfer of Care (Signed)
Immediate Anesthesia Transfer of Care Note  Patient: Brandon Mendoza.  Procedure(s) Performed: IRRIGATION AND DEBRIDEMENT EXTREMITY WITH WOUND CLOSURE (Right: Knee)  Patient Location: PACU  Anesthesia Type:General  Level of Consciousness: awake and alert   Airway & Oxygen Therapy: Patient Spontanous Breathing and Patient connected to nasal cannula oxygen  Post-op Assessment: Report given to RN and Post -op Vital signs reviewed and stable  Post vital signs: Reviewed and stable  Last Vitals:  Vitals Value Taken Time  BP    Temp 36.1 C 10/07/22 1900  Pulse 71 10/07/22 1903  Resp 18 10/07/22 1903  SpO2 98 % 10/07/22 1903  Vitals shown include unfiled device data.  Last Pain:  Vitals:   10/07/22 1553  TempSrc: Oral  PainSc: 0-No pain      Patients Stated Pain Goal: 0 (10/07/22 0551)  Complications: No notable events documented.

## 2022-10-07 NOTE — Anesthesia Preprocedure Evaluation (Addendum)
Anesthesia Evaluation  Patient identified by MRN, date of birth, ID band Patient awake    Reviewed: Allergy & Precautions, NPO status , Patient's Chart, lab work & pertinent test results  Airway Mallampati: II  TM Distance: >3 FB Neck ROM: Full  Mouth opening: Limited Mouth Opening  Dental  (+) Teeth Intact, Dental Advisory Given   Pulmonary sleep apnea , COPD, former smoker   breath sounds clear to auscultation       Cardiovascular hypertension, + Peripheral Vascular Disease  + dysrhythmias Atrial Fibrillation  Rhythm:Regular Rate:Normal     Neuro/Psych  PSYCHIATRIC DISORDERS Anxiety Depression    CVA    GI/Hepatic Neg liver ROS,GERD  ,,  Endo/Other    Renal/GU Renal disease     Musculoskeletal   Abdominal   Peds  Hematology   Anesthesia Other Findings   Reproductive/Obstetrics                             Anesthesia Physical Anesthesia Plan  ASA: 3  Anesthesia Plan: General   Post-op Pain Management: Tylenol PO (pre-op)*   Induction: Intravenous  PONV Risk Score and Plan: 3 and Ondansetron, Dexamethasone and Treatment may vary due to age or medical condition  Airway Management Planned: Oral ETT  Additional Equipment: None  Intra-op Plan:   Post-operative Plan: Extubation in OR  Informed Consent: I have reviewed the patients History and Physical, chart, labs and discussed the procedure including the risks, benefits and alternatives for the proposed anesthesia with the patient or authorized representative who has indicated his/her understanding and acceptance.     Dental advisory given  Plan Discussed with: CRNA  Anesthesia Plan Comments:        Anesthesia Quick Evaluation

## 2022-10-07 NOTE — ED Provider Notes (Addendum)
[place] [facility]ERGENCY DEPARTMENT AT [facility] Provider Note   CSN: [id number] Arrival date & time: 10/07/22  0536     History  Chief Complaint  Patient presents with   Fall   Leg Injury    Brandon Mendoza. is a 82 y.o. male status post 2 right quad repairs with Dr. Drenda Freeze presented after a fall this morning.  Patient dates he went to get out of bed to use the urinal however he felt his right knee pop and split open which caused him to fall and land on his butt.  Patient has hitting his head and denies any head pain, neck pain, back pain.  Patient states that the surgical site for his right quad tendon repair is now open.  Patient states that he has pain out of it however is not excruciating.  Patient states he still able to feel his right foot and move his right foot as well but cannot move his right knee due to the pain and risk of worsening the wound tear.  Patient presented in knee immobilizer with pressure dressing on knee.  Patient denies nausea/vomiting, abdominal pain, chest pain, shortness of breath, vision changes, fevers, recent illness  Home Medications Prior to Admission medications   Medication Sig Start Date End Date Taking? Authorizing Provider  acetaminophen (TYLENOL) 325 MG tablet Take 650 mg by mouth as needed for moderate pain.   Yes [provider]  aspirin EC 81 MG tablet Take 81 mg by mouth every evening. Swallow whole.   Yes [provider]  DULoxetine (CYMBALTA) 30 MG capsule Take 1 capsule (30 mg total) by mouth at bedtime. 09/14/22  Yes Lula Olszewski, MD  flecainide (TAMBOCOR) 100 MG tablet Take 1 tablet (100 mg total) by mouth 2 (two) times daily. Patient taking differently: Take 50 mg by mouth 2 (two) times daily. 05/19/22  Yes Yates Decamp, MD  losartan-hydrochlorothiazide (HYZAAR) 50-12.5 MG tablet Take 1 tablet by mouth in the morning.   Yes [provider]  Multiple Vitamin (MULTIVITAMIN WITH MINERALS) TABS  tablet Take 1 tablet by mouth in the morning.   Yes [provider]  polyethylene glycol (MIRALAX / GLYCOLAX) 17 g packet Take 17 g by mouth 2 (two) times daily. Patient taking differently: Take 17 g by mouth in the morning. 07/13/22  Yes Cassandria Anger, PA-C  rosuvastatin (CRESTOR) 10 MG tablet Take 10 mg by mouth every evening.   Yes [provider]  sertraline (ZOLOFT) 50 MG tablet Take 1 tablet (50 mg total) by mouth daily. Use to taper off sertraline slowly over several months, if tolerated. Be aware anxiety and irritability may increase and you might have to stay on this Patient taking differently: Take 25 mg by mouth daily. Use to taper off sertraline slowly over several months, if tolerated. Be aware anxiety and irritability may increase and you might have to stay on this 03/01/22 02/24/23 Yes Lula Olszewski, MD  methocarbamol (ROBAXIN) 500 MG tablet Take 1 tablet (500 mg total) by mouth every 6 (six) hours as needed for muscle spasms. Patient not taking: Reported on 10/07/2022 07/13/22   Cassandria Anger, PA-C  ondansetron (ZOFRAN) 4 MG tablet Take 1 tablet (4 mg total) by mouth every 6 (six) hours as needed for nausea. Patient not taking: Reported on 10/07/2022 07/15/22   Alwyn Ren, MD  oxyCODONE (OXY IR/ROXICODONE) 5 MG immediate release tablet Take 1 tablet (5 mg total) by mouth every 6 (  six) hours as needed for severe pain. Patient not taking: Reported on 10/07/2022 09/05/22   Cassandria Anger, PA-C      Allergies    Patient has no known allergies.    Review of Systems   Review of Systems See HPI Physical Exam Updated Vital Signs BP (!) 110/96   Pulse 88   Temp 97.8 F (36.6 C) (Oral)   Resp 16   Ht 5\' 8"  (1.727 m)   Wt 79.4 kg   SpO2 94%   BMI 26.61 kg/m  Physical Exam Constitutional:      General: He is not in acute distress. Cardiovascular:     Rate and Rhythm: Normal rate and regular rhythm.     Pulses: Normal pulses.   Musculoskeletal:     Comments: Follow-up range of motion right ankle/toes Unwilling to range knee due to concern for worsening laceration  Skin:    General: Skin is warm and dry.     Capillary Refill: Capillary refill takes less than 2 seconds.     Comments: 10 cm laceration noted to right knee with quad tendon present, laceration is approximately 3 to 4 cm wide, bleeding is controlled No erythema noted, not warm to the touch, no purulent discharge Right lower extremity: Not pale or cool to the touch  Neurological:     Mental Status: He is alert.     Comments: Sensation intact distally  Psychiatric:        Mood and Affect: Mood normal.     ED Results / Procedures / Treatments   Labs (all labs ordered are listed, but only abnormal results are displayed) Labs Reviewed  BASIC METABOLIC PANEL - Abnormal; Notable for the following components:      Result Value   Sodium 133 (*)    Glucose, Bld 110 (*)    Calcium 8.4 (*)    All other components within normal limits  CBC WITH DIFFERENTIAL/PLATELET - Abnormal; Notable for the following components:   WBC 11.5 (*)    Neutro Abs 8.8 (*)    Monocytes Absolute 1.3 (*)    All other components within normal limits    EKG None  Radiology DG Knee Right Port  Result Date: 10/07/2022 CLINICAL DATA:  82 year old male status post fall. Two previous right quadriceps surgeries, most recently 08/30/2022. EXAM: PORTABLE RIGHT KNEE - 1-2 VIEW COMPARISON:  Right knee series 07/05/2022. FINDINGS: Portable cross-table lateral view at 0714 hours. Indistinct soft tissue gas superior to the patella, may be resolving postoperative change in this setting. Small joint effusion is possible, but regressed compared to 07/05/2022. Patella positioning is within normal limits. Portable AP view, some chronic medial compartment joint space loss. No acute osseous abnormality identified. Calcified peripheral vascular disease. IMPRESSION: 1. Indistinct soft tissue gas  and thickening superior to the patella, might be resolving postoperative change in this setting. Correlate for any signs/symptoms of infection. 2. No acute fracture or dislocation identified about the right knee. Electronically Signed   By: Odessa Fleming M.D.   On: 10/07/2022 07:24    Procedures .Critical Care  Performed by: Netta Corrigan, PA-C Authorized by: Netta Corrigan, PA-C   Critical care provider statement:    Critical care time (minutes):  40   Critical care time was exclusive of:  Separately billable procedures and treating other patients   Critical care was necessary to treat or prevent imminent or life-threatening deterioration of the following conditions: Open laceration down to the patella.   Critical care  was time spent personally by me on the following activities:  Development of treatment plan with patient or surrogate, discussions with consultants, evaluation of patient's response to treatment, examination of patient, obtaining history from patient or surrogate, review of old charts, re-evaluation of patient's condition, pulse oximetry, ordering and review of radiographic studies, ordering and review of laboratory studies and ordering and performing treatments and interventions   I assumed direction of critical care for this patient from another provider in my specialty: no     Care discussed with: admitting provider       Medications Ordered in ED Medications  fentaNYL (SUBLIMAZE) injection 25 mcg (25 mcg Intravenous Given 10/07/22 0632)  ceFAZolin (ANCEF) IVPB 1 g/50 mL premix (1 g Intravenous New Bag/Given 10/07/22 1610)    ED Course/ Medical Decision Making/ A&P                             Medical Decision Making Amount and/or Complexity of Data Reviewed Labs: ordered. Radiology: ordered.  Risk Prescription drug management. Decision regarding hospitalization.   [name] 82 y.o. presented today for right knee open laceration. Working DDx that I  considered at this time includes, but not limited to, dehisced surgical wound, strain/sprain, fracture, dislocation, neurovascular compromise, ischemic limb, compartment syndrome, septic arthritis.  R/o DDx: strain/sprain, fracture, dislocation, neurovascular compromise, ischemic limb, compartment syndrome, septic arthritis: These are considered less likely due to history of present illness and physical exam findings  Review of prior external notes: 10/05/2022 office visit  Unique Tests and My Interpretation:  BMP: Mild leukocytosis 11.5 CBC: pending Right knee x-ray: Indistinct soft tissue gas  Discussion with Independent Historian: None  Discussion of Management of Tests:  [name], MD Ortho  Risk: High: hospitalization or escalation of hospital-level care and High: emergency major surgery  Risk Stratification Score: none  Plan: On exam patient was in no acute distress stable vitals.  Patient was neurovascularly intact distally in his right lower extremity however upon inspecting patient's right knee there does appear to be an open laceration down to his quad tendon.  Bleeding is controlled at this time.  Patient reports having mechanical fall due to the surgical site opening up causing him to fall this morning and does not endorse any head pain, neck pain, back pain that would necessitate imaging in those areas.  He does not appear septic or infected at this time so cultures were not obtained.  Basic labs will be drawn, patient was given pain meds for his discomfort and patient was started on Ancef for anticipation of going to the OR to have joint cleaned out and repaired.  Ortho will be consulted.  Patient stable this time.  I spoke to orthopedics who stated that patient will need a clean dry dressing and that they would come see him to him to the OR today for washout and wound VAC.  Orders placed for patient to become n.p.o. and this plan was relayed to the patient.  Patient stable at this  time.  Dr. Charlann Boxer from orthopedics called and stated that since patient will not be able to get to the OR until possibly 5 PM today to irrigate the wound place Betadine soaked within the wound but clean dressing over it and placed the knee back in the immobilizer.  This was done with the help of Mellody Dance the paramedic and completed without complication.  Patient stable this time.   Final Clinical Impression(s) /  ED Diagnoses Final diagnoses:  Surgical wound dehiscence, initial encounter    Rx / DC Orders ED Discharge Orders     None         Remi Deter 10/07/22 0748    Netta Corrigan, PA-C 10/07/22 1610    Jacalyn Lefevre, MD 10/07/22 [phone]

## 2022-10-07 NOTE — Op Note (Signed)
OPERATIVE REPORT  DATE OF SURGERY: 10/07/2022  PATIENT NAME:  Brandon Mendoza. MRN: [id number] DOB: 04-26-1940  PCP: Lula Olszewski, MD  PRE-OPERATIVE DIAGNOSIS: Wound dehiscence with rerupture of the quad tendon  POST-OPERATIVE DIAGNOSIS: Same  PROCEDURE:   Irrigation and debridement of wound dehiscence.   Application of external wound VAC  SURGEON:  Venita Lick, MD  PHYSICIAN ASSISTANT: None  ANESTHESIA:   General  EBL: Minimal   Complications: None  BRIEF HISTORY: Zakhar Wilinski. is a 82 y.o. male who had a quad tendon rupture that was surgically repaired approximately 5 weeks ago.  Patient got out of bed today and fell and noted a complete dehiscence of his surgical wound.  Prior to this the wound was healed and there is no signs of infection.  Patient presented to the emergency room for further evaluation and treatment.  As result of the surgical wound dehiscence orthopedic consultation was requested.  After evaluation I elected to take the patient back to the operating room for formal irrigation and debridement of the wound followed by primary wound closure.  Patient is followed by Dr. Charlann Boxer and he is aware.  PROCEDURE DETAILS: Patient was brought into the operating room and was properly positioned on the operating room table.  After induction with general anesthesia the patient was endotracheally intubated.  A timeout was taken to confirm all important data: including patient, procedure, and the level. Teds, SCD's were applied.   The right lower extremity was prepped and draped in a standard fashion.  The wound was then explored.  Tendon was noted to be ruptured.  Suture was removed.  Cultures were obtained.  The wound was then irrigated with 6 L of normal saline followed by antibiotic irrigation.  Wound edges were freshened with a 15 blade scalpel.  The was then closed with #1 PDS using a vertical running mattress suture technique.  An external Prevena wound VAC was  then applied.  Knee immobilizer was then applied.  Patient was ultimately extubated and transferred the PACU without incident.  Venita Lick, MD 10/07/2022 6:52 PM

## 2022-10-07 NOTE — Consult Note (Addendum)
Reason for Consult:right knee wound dehiscence  Referring Physician: Particia Nearing, MD  Brandon Brass. is an 82 y.o. male.  HPI: Brandon Pezza. is a 82 y.o. male status post 2 right quad repairs with Dr. Drenda Freeze presented after a fall this morning.  Patient dates he went to get out of bed to use the urinal however he felt his right knee pop and split open which caused him to fall and land on his butt.  Patient has hitting his head and denies any head pain, neck pain, back pain.  Patient states that the surgical site for his right quad tendon repair is now open.  Patient states that he has pain out of it however is not excruciating.  Patient states he still able to feel his right foot and move his right foot as well but cannot move his right knee due to the pain and risk of worsening the wound tear.  Patient presented in knee immobilizer with pressure dressing on knee.   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   The spinal cord appears normal. 2.   No spinal stenosis. 3.   Mild multilevel degenerative changes as detailed above that do not lead to spinal stenosis or nerve root compression. 4.   T2 hyperintense foci within the pons consistent with chronic microvascular ischemic changes.   Loop - [employer] Linq 10/22/2020 10/22/2020    Nocturia    Paroxysmal atrial fibrillation (HCC)    Peripheral neuropathy    Peripheral vascular disease (HCC)    Sleep apnea    on 6 cm, nasal pillow   Spinal stenosis    Stroke (HCC) 05/01/2011   ischemic   Weakness generalized 10/21/2021   Severe, legs and arms    Past Surgical History:  Procedure Laterality Date   CAROTID ENDARTERECTOMY  12/09/11   Left cea   ENDARTERECTOMY  12/09/2011   Procedure: ENDARTERECTOMY CAROTID;  Surgeon: Larina Earthly, MD;  Location: [facility] OR;  Service: Vascular;  Laterality: Left;   EYE SURGERY     rt retina detachment   HAMMER TOE SURGERY  2004   HERNIA REPAIR  2006   QUADRICEPS TENDON REPAIR Right 07/12/2022   Procedure: REPAIR QUADRICEP TENDON;  Surgeon: Durene Romans, MD;  Location: [facility] ORS;  Service: Orthopedics;  Laterality: Right;   QUADRICEPS TENDON REPAIR Right 08/30/2022   Procedure: REPAIR QUADRICEP TENDON;  Surgeon: Durene Romans, MD;  Location: [facility] ORS;  Service: Orthopedics;  Laterality: Right;   SHOULDER ARTHROSCOPY W/ ROTATOR CUFF REPAIR  2010   TONSILLECTOMY      Family History  Problem Relation Age of Onset   Alzheimer's disease Father    Diabetes Other  Social History:  reports that he quit smoking about 18 years ago. His smoking use included cigarettes. He started smoking about 68 years ago. He has a 50 pack-year smoking history. He has never been exposed to tobacco smoke. He has never used smokeless tobacco. He reports current alcohol use of about 7.0 standard drinks of alcohol per week. He reports that he does not use drugs.  Allergies: No Known Allergies  Medications: I have reviewed the patient's current medications. Scheduled:  Results for orders placed or performed during the hospital encounter of 10/07/22 (from the past 24 hour(s))  Basic metabolic panel     Status: Abnormal   Collection Time: 10/07/22  7:28 AM  Result Value Ref Range   Sodium 133 (L) 135 - 145 mmol/L   Potassium 3.9 3.5 - 5.1 mmol/L   Chloride 103  98 - 111 mmol/L   CO2 22 22 - 32 mmol/L   Glucose, Bld 110 (H) 70 - 99 mg/dL   BUN 22 8 - 23 mg/dL   Creatinine, Ser 0.45 0.61 - 1.24 mg/dL   Calcium 8.4 (L) 8.9 - 10.3 mg/dL   GFR, Estimated >40 >98 mL/min   Anion gap 8 5 - 15  CBC with Differential     Status: Abnormal   Collection Time: 10/07/22  7:28 AM  Result Value Ref Range   WBC 11.5 (H) 4.0 - 10.5 K/uL   RBC 4.86 4.22 - 5.81 MIL/uL   Hemoglobin 14.6 13.0 - 17.0 g/dL   HCT 11.9 14.7 - 82.9 %   MCV 88.7 80.0 - 100.0 fL   MCH 30.0 26.0 - 34.0 pg   MCHC 33.9 30.0 - 36.0 g/dL   RDW 56.2 13.0 - 86.5 %   Platelets 271 150 - 400 K/uL   nRBC 0.0 0.0 - 0.2 %   Neutrophils Relative % 76 %   Neutro Abs 8.8 (H) 1.7 - 7.7 K/uL   Lymphocytes Relative 7 %   Lymphs Abs 0.8 0.7 - 4.0 K/uL   Monocytes Relative 11 %   Monocytes Absolute 1.3 (H) 0.1 - 1.0 K/uL   Eosinophils Relative 4 %   Eosinophils Absolute 0.5 0.0 - 0.5 K/uL   Basophils Relative 1 %   Basophils Absolute 0.1 0.0 - 0.1 K/uL   Immature Granulocytes 1 %   Abs Immature Granulocytes 0.06 0.00 - 0.07 K/uL     X-ray: CLINICAL DATA:  82 year old male status post fall. Two previous right quadriceps surgeries, most recently 08/30/2022.   EXAM: PORTABLE RIGHT KNEE - 1-2 VIEW   COMPARISON:  Right knee series 07/05/2022.   FINDINGS: Portable cross-table lateral view at 0714 hours. Indistinct soft tissue gas superior to the patella, may be resolving postoperative change in this setting. Small joint effusion is possible, but regressed compared to 07/05/2022. Patella positioning is within normal limits. Portable AP view, some chronic medial compartment joint space loss. No acute osseous abnormality identified. Calcified peripheral vascular disease.   IMPRESSION: 1. Indistinct soft tissue gas and thickening superior to the patella, might be resolving postoperative change in this setting. Correlate for any signs/symptoms of infection. 2. No acute fracture or dislocation  identified about the right knee.     Electronically Signed   By: Odessa Fleming M.D.  ROS: As per HPI  Blood pressure 115/67, pulse 83, temperature 98 F (36.7 C), temperature source Oral, resp. rate 18, height 5\' 8"  (1.727 m), weight 79.4 kg, SpO2 96%.  Physical Exam: Expand All Collapse All   [facility] AT [facility] Provider Note     CSN: [id number] Arrival date & time: 10/07/22  0536     History      Chief Complaint  Patient presents with   Fall   Leg Injury      Brandon Egler. is a 82 y.o. male status post 2 right quad repairs with Dr. Drenda Freeze presented after a fall this morning.  Patient dates he went to get out of bed to use the urinal however he felt his right knee pop and split open which caused him to fall and land on his butt.  Patient has hitting his head and denies any head pain, neck pain, back pain.  Patient states that the surgical site for his right quad tendon repair is now open.  Patient states that he has pain out of it however is not excruciating.  Patient states he still able to feel his right foot and move his right foot as well but cannot move his right knee due to the pain and risk of worsening the wound tear.  Patient presented in knee immobilizer with pressure dressing on knee.   Patient denies nausea/vomiting, abdominal pain, chest pain, shortness of breath, vision changes, fevers, recent illness   Home Medications        Prior to Admission medications   Medication Sig Start Date End Date Taking? Authorizing Provider  acetaminophen (TYLENOL) 325 MG tablet Take 650 mg by mouth as needed for moderate pain.     Yes [provider]  aspirin EC 81 MG tablet Take 81 mg by mouth every evening. Swallow whole.     Yes [provider]  DULoxetine (CYMBALTA) 30 MG capsule Take 1 capsule (30 mg total) by mouth at bedtime. 09/14/22   Yes Lula Olszewski, MD  flecainide (TAMBOCOR) 100 MG tablet Take 1 tablet  (100 mg total) by mouth 2 (two) times daily. Patient taking differently: Take 50 mg by mouth 2 (two) times daily. 05/19/22   Yes Yates Decamp, MD  losartan-hydrochlorothiazide (HYZAAR) 50-12.5 MG tablet Take 1 tablet by mouth in the morning.     Yes [provider]  Multiple Vitamin (MULTIVITAMIN WITH MINERALS) TABS tablet Take 1 tablet by mouth in the morning.     Yes [provider]  polyethylene glycol (MIRALAX / GLYCOLAX) 17 g packet Take 17 g by mouth 2 (two) times daily. Patient taking differently: Take 17 g by mouth in the morning. 07/13/22   Yes Cassandria Anger, PA-C  rosuvastatin (CRESTOR) 10 MG tablet Take 10 mg by mouth every evening.     Yes [provider]  sertraline (ZOLOFT) 50 MG tablet Take 1 tablet (50 mg total) by mouth daily. Use to taper off sertraline slowly over several months, if tolerated. Be aware anxiety and irritability may increase and you might have to stay on this Patient taking differently: Take 25 mg by mouth daily. Use to taper off sertraline slowly over several months, if tolerated. Be aware anxiety and irritability may increase and you might have to stay on this 03/01/22 02/24/23 Yes Lula Olszewski, MD  methocarbamol (ROBAXIN) 500 MG tablet Take 1 tablet (500 mg total) by mouth every 6 (six) hours as needed for muscle spasms. Patient not taking: Reported on 10/07/2022 07/13/22     Cassandria Anger, PA-C  ondansetron (ZOFRAN) 4 MG tablet Take 1 tablet (4 mg total) by mouth every 6 (six) hours as needed for nausea. Patient  not taking: Reported on 10/07/2022 07/15/22     Alwyn Ren, MD  oxyCODONE (OXY IR/ROXICODONE) 5 MG immediate release tablet Take 1 tablet (5 mg total) by mouth every 6 (six) hours as needed for severe pain. Patient not taking: Reported on 10/07/2022 09/05/22     Cassandria Anger, PA-C       Allergies            Patient has no known allergies.     Review of Systems   Review of Systems See HPI Physical  Exam Updated Vital Signs BP (!) 110/96   Pulse 88   Temp 97.8 F (36.6 C) (Oral)   Resp 16   Ht 5\' 8"  (1.727 m)   Wt 79.4 kg   SpO2 94%   BMI 26.61 kg/m  Physical Exam Constitutional:      General: He is not in acute distress. Cardiovascular:     Rate and Rhythm: Normal rate and regular rhythm.     Pulses: Normal pulses.  Musculoskeletal:     Comments: Follow-up range of motion right ankle/toes Unwilling to range knee due to concern for worsening laceration  Skin:    General: Skin is warm and dry.     Capillary Refill: Capillary refill takes less than 2 seconds.     Comments: 10 cm laceration noted to right knee with quad tendon present, laceration is approximately 3 to 4 cm wide, bleeding is controlled No erythema noted, not warm to the touch, no purulent discharge Right lower extremity: Not pale or cool to the touch  Neurological:     Mental Status: He is alert.     Comments: Sensation intact distally  Psychiatric:        Mood and Affect: Mood normal.          Assessment/Plan: Right knee dehiscence plus minus recurrent quadricept tendon rupture   Plan: Dr [name] has graciously stepped up to hep with his knee. The plan is to go to the OR tonight when able to wash his knee wound and assess the tendon.  If the tendon repair remains in tact then he will close the wound and place him in splint or immobilizer to follow up with me Charlann Boxer). If there is recurrent quad tendon rupture the wound will be closed and patient admitted for consultation with Aundria Rud to discuss next steps NPO Orders placed for OR tonight  Shelda Pal 10/07/2022, 2:28 PM    Agree with above Plan on I&D and wound closure Risks/benefits reviewed and consent obtained

## 2022-10-07 NOTE — Anesthesia Postprocedure Evaluation (Signed)
Anesthesia Post Note  Patient: Brandon Mendoza.  Procedure(s) Performed: IRRIGATION AND DEBRIDEMENT EXTREMITY WITH WOUND CLOSURE (Right: Knee)     Patient location during evaluation: PACU Anesthesia Type: General Level of consciousness: awake and alert Pain management: pain level controlled Vital Signs Assessment: post-procedure vital signs reviewed and stable Respiratory status: spontaneous breathing, nonlabored ventilation, respiratory function stable and patient connected to nasal cannula oxygen Cardiovascular status: blood pressure returned to baseline and stable Postop Assessment: no apparent nausea or vomiting Anesthetic complications: no  No notable events documented.  Last Vitals:  Vitals:   10/07/22 1945 10/07/22 2000  BP: (!) 159/73 (!) 146/66  Pulse: 72 73  Resp: 13 12  Temp:  36.6 C  SpO2: 97% 95%    Last Pain:  Vitals:   10/07/22 2000  TempSrc:   PainSc: 0-No pain                 Shelton Silvas

## 2022-10-08 ENCOUNTER — Encounter (HOSPITAL_COMMUNITY): Payer: Self-pay | Admitting: Orthopedic Surgery

## 2022-10-08 NOTE — Progress Notes (Signed)
During the night shift patient had many complaints: Wearing pulse ox monitor, having to wear oxygen because sat;s were dropping, IV site. Tried to please patient with not success.

## 2022-10-08 NOTE — Progress Notes (Signed)
   Subjective:  Patient reports pain as moderate.  Doing fine this morning.  No overnight issues.  No complaints of chest pain or shortness of breath.  Objective:   VITALS:   Vitals:   10/07/22 2251 10/08/22 0048 10/08/22 0428 10/08/22 0500  BP: (!) 158/91 139/61 137/67   Pulse: (!) 55 84 77   Resp: 18 17 17    Temp:  97.7 F (36.5 C) 97.6 F (36.4 C) 98.1 F (36.7 C)  TempSrc:  Oral Oral Oral  SpO2: 98% 97% 98%   Weight:      Height:        Right knee has knee immobilizer in place as well as a external wound VAC.  Good seal on the VAC.  Distally neurovascularly intact.  Minimal swelling and subcutaneous edema noted in the lower leg.   Lab Results  Component Value Date   WBC 11.5 (H) 10/07/2022   HGB 14.6 10/07/2022   HCT 43.1 10/07/2022   MCV 88.7 10/07/2022   PLT 271 10/07/2022   BMET    Component Value Date/Time   NA 133 (L) 10/07/2022 0728   K 3.9 10/07/2022 0728   CL 103 10/07/2022 0728   CO2 22 10/07/2022 0728   GLUCOSE 110 (H) 10/07/2022 0728   BUN 22 10/07/2022 0728   CREATININE 1.02 10/07/2022 0728   CALCIUM 8.4 (L) 10/07/2022 0728   GFRNONAA >60 10/07/2022 0728     Assessment/Plan: 1 Day Post-Op   Principal Problem:   Wound dehiscence   Up with therapy -Okay to weight-bear as tolerated with the knee immobilizer in place.  He should also keep this on while in bed to maintain full extension through the knee.  -Maintain wound VAC for 7 days then can be DC'd and transition to this standard daily dry dressings.  -Keep incision clean and dry until follow-up in 2 to 3 weeks.  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  I imagine he will need skilled nursing facility post discharge.  Will work on getting approval for that while he is here.  Otherwise maintain inpatient status.   [name] 10/08/2022, 9:37 AM   Maryan Rued, MD [phone]

## 2022-10-09 MED ORDER — MEDIHONEY WOUND/BURN DRESSING EX PSTE
1.0000 | PASTE | Freq: Every day | CUTANEOUS | Status: DC
  Administered 2022-10-09 – 2022-10-18 (×8): 1 via TOPICAL
  Filled 2022-10-09 (×2): qty 44

## 2022-10-09 NOTE — Progress Notes (Signed)
Orthopedics Progress Note  Subjective: Patient reports pain well controlled. He has a lot of questions about future care and discharge  Objective:  Vitals:   10/08/22 2122 10/09/22 0515  BP: (!) 132/52 124/65  Pulse: 68 64  Resp: 18 18  Temp: 98.1 F (36.7 C) 97.6 F (36.4 C)  SpO2: 98% 100%    General: Awake and alert  Musculoskeletal: Right knee dressing intact, wound vac functioning properly. Right heel ulcer dressed/padded and stable and dry. Minimal leg swelling Neurovascularly intact  Lab Results  Component Value Date   WBC 11.5 (H) 10/07/2022   HGB 14.6 10/07/2022   HCT 43.1 10/07/2022   MCV 88.7 10/07/2022   PLT 271 10/07/2022       Component Value Date/Time   NA 133 (L) 10/07/2022 0728   K 3.9 10/07/2022 0728   CL 103 10/07/2022 0728   CO2 22 10/07/2022 0728   GLUCOSE 110 (H) 10/07/2022 0728   BUN 22 10/07/2022 0728   CREATININE 1.02 10/07/2022 0728   CALCIUM 8.4 (L) 10/07/2022 0728   GFRNONAA >60 10/07/2022 0728   GFRAA >90 12/10/2011 0355    Lab Results  Component Value Date   INR 1.0 12/10/2020   INR 0.99 11/29/2011    Assessment/Plan: POD #2 s/p Procedure(s): IRRIGATION AND DEBRIDEMENT EXTREMITY WITH WOUND CLOSURE Stable this AM. Patient requesting short term SNF rehab as he says his wife cannot care for him. Social Work consulted. He will also need a hospital bed once he can go home.   Cultures from I+D on Friday are still No Growth.  Continue wound vac. Dr Charlann Boxer will see the patient tomorrow and discuss next steps as his quad tendon repair failed as a part of the dehiscence   Viviann Spare R. Ranell Patrick, MD 10/09/2022 10:03 AM

## 2022-10-09 NOTE — Plan of Care (Signed)
°  Problem: Clinical Measurements: °Goal: Will remain free from infection °Outcome: Progressing °  °Problem: Activity: °Goal: Risk for activity intolerance will decrease °Outcome: Progressing °  °Problem: Nutrition: °Goal: Adequate nutrition will be maintained °Outcome: Progressing °  °

## 2022-10-09 NOTE — TOC Progression Note (Signed)
Transition of Care (TOC) - Progression Note    Patient Details  Name: Brandon Mendoza. MRN: [id number] Date of Birth: Aug 02, 1940  Transition of Care [facility]) CM/SW Contact  Coralyn Helling, [place] Phone Number: 10/09/2022, 9:57 AM  Clinical Narrative:     Transition of Care [facility]) - Inpatient Brief Assessment   Patient Details  Name: Brandon Mendoza. MRN: [id number] Date of Birth: 04/19/1940  Transition of Care [facility]) CM/SW Contact:    Coralyn Helling, LCSW Phone Number: 10/09/2022, 9:57 AM   Clinical Narrative:  From home with spouse. Independent at baseline. Surgery today.   TOC to follow for needs.   Transition of Care Asessment: Insurance and Status: Insurance coverage has been reviewed Patient has primary care physician: Yes Home environment has been reviewed: from home with spouse Prior level of function:: independent at baseline Prior/Current Home Services: No current home services Social Determinants of Health Reivew: SDOH reviewed needs interventions Readmission risk has been reviewed: Yes Transition of care needs: transition of care needs identified, TOC will continue to follow        Expected Discharge Plan and Services                                               Social Determinants of Health (SDOH) Interventions SDOH Screenings   Food Insecurity: No Food Insecurity (10/08/2022)  Housing: Low Risk  (10/08/2022)  Transportation Needs: No Transportation Needs (10/08/2022)  Utilities: Not At Risk (10/08/2022)  Depression (PHQ2-9): Low Risk  (12/10/2021)  Social Connections: Unknown (08/10/2021)   Received from [facility]  Tobacco Use: Medium Risk (10/07/2022)    Readmission Risk Interventions    07/09/2022    1:44 PM  Readmission Risk Prevention Plan  Transportation Screening Complete  PCP or Specialist Appt within 5-7 Days Complete  Home Care Screening Complete  Medication Review (RN CM) Complete

## 2022-10-09 NOTE — Consult Note (Signed)
WOC Nurse Consult Note: Reason for Consult:Posterior right heel Unstageable pressure injury related to Medical device. I am assisted in the development of the POC by Wound Treatment Associate (Certified), P. Armstrong, WTA-C. Wound type:Pressure Pressure Injury POA: Yes Measurement:To be obtained by Bedside Rn with next dressing application and documented on Nursing Flow Sheet Wound bed: Nonviable Drainage (amount, consistency, odor) small serous Periwound:intact Dressing procedure/placement/frequency: I have provided Nursing with guidance for the care of this wound using a daily cleanse with NS followed by an application of leptospermum Manuka honey (MediHoney). This is to be covered with a dry gauze dressing and secured with a silicone foam. Heels are to be floated.  I communicated the presence of this wound to Dr. Artis Delay, via Secure Chat so tat he might assess during his rounds today.   WOC nursing team will not follow, but will remain available to this patient, the nursing and medical teams.  Please re-consult if needed.  Thank you for inviting [place] to participate in this patient's Plan of Care.  Ladona Mow, MSN, RN, CNS, GNP, Leda Min, [employer], [employer] phone:  [phone]

## 2022-10-10 NOTE — Progress Notes (Signed)
Pt self administers his home CPAP.  Advised patient if he needs help applying to have RN contact RT.

## 2022-10-10 NOTE — [place] FL2 (Signed)
[place] MEDICAID FL2 LEVEL OF CARE FORM     IDENTIFICATION  Patient Name: Brandon Corp. Birthdate: 1940/05/12 Sex: male Admission Date (Current Location): 10/07/2022  [facility] and [place] Number:  Producer, television/film/video and Address:  [facility],  [address], [place] [address]      Provider Number: [id number]  Attending Physician Name and Address:  Durene Romans, MD  Relative Name and Phone Number:  Hector Taft (spouse) Ph: [phone]    Current Level of Care: Hospital Recommended Level of Care: Skilled Nursing Facility Prior Approval Number:    Date Approved/Denied:   PASRR Number: [id number]  Discharge Plan: SNF    Current Diagnoses: Patient Active Problem List   Diagnosis Date Noted   Wound dehiscence 10/07/2022   Quadriceps tendon rupture, right, subsequent encounter 08/30/2022   Quadriceps tendon rupture, right, initial encounter 07/12/2022   Unable to care for self 07/08/2022   Dehydration 07/08/2022   Depression 07/08/2022   COPD (chronic obstructive pulmonary disease) (HCC) 07/08/2022   Macular degeneration 03/01/2022   Myofascial pain 02/10/2022   Lumbar spondylosis 01/03/2022   Acquired clawfoot, left foot 12/16/2021   Acquired clawfoot, right foot 12/16/2021   Claw foot 12/14/2021   Gait abnormality 12/10/2021   Spinal stenosis at L4-L5 level 12/10/2021   Ataxia 11/22/2021   Lightheadedness 11/02/2021   Claudication of lower extremity (HCC) 10/21/2021   Atrophy of calf muscles 10/21/2021   Weakness generalized 10/21/2021   Focal neurological deficit 10/21/2021   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   Pain in joint of right hip 09/14/2021   High arches 07/22/2021   Hammertoes of both feet 07/22/2021   Neuropathy 07/22/2021   Constipation 05/28/2021   Disequilibrium 05/28/2021   Personal history of colonic polyps 05/28/2021   Renal cyst 05/28/2021   Gastroesophageal reflux disease without  esophagitis 05/05/2021   Ingrowing left great toenail 02/15/2021   S/P lumbar fusion 12/24/2020   Spinal stenosis of lumbar region 12/04/2020   Acquired complex renal cyst 11/23/2020   Functional gait abnormality 11/04/2020   Idiopathic neuropathy 11/02/2020   Tinea pedis of both feet 10/28/2020   Loop - Medtronic Linq 10/22/2020 10/22/2020   Benign hypertension with 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) 10/05/2020   Fall with injury 10/05/2020   Hematoma 09/16/2020   Normocytic anemia 09/14/2020   Leukocytosis 09/14/2020   AF (paroxysmal atrial fibrillation) (HCC) 09/14/2020   Chronic anticoagulation 09/14/2020   Hyponatremia 09/14/2020   Pain in left foot 01/22/2020   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   Pain in joint of right knee 12/27/2019   Recurrent falls 11/17/2019   Paroxysmal atrial fibrillation (HCC) 10/22/2019   Pain in right foot 10/14/2019   Falls 10/07/2019   Ataxia involving legs 08/12/2019   OSA on CPAP 06/20/2018   Urge incontinence 04/13/2018   Urinary urgency 04/13/2018   Pain in left knee 03/01/2018   Other intervertebral disc degeneration, lumbar region 06/16/2017   Acquired bilateral hammer toes 10/05/2015   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   Generalized anxiety disorder 05/12/2015   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   History of left-sided carotid endarterectomy 04/10/2014   Essential hypertension 04/10/2014   Occlusion and stenosis of carotid artery without mention of cerebral infarction 12/15/2011   Habitual alcohol use 12/01/2011   Carotid artery stenosis, symptomatic 11/30/2011   Cerebral infarction (HCC) 11/29/2011  Mixed hyperlipidemia     Orientation RESPIRATION BLADDER Height & Weight     Self, Time, Situation, Place  Normal Continent Weight: 175 lb 0.7 oz (79.4 kg) Height:  5\' 8"  (172.7 cm)  BEHAVIORAL  SYMPTOMS/MOOD NEUROLOGICAL BOWEL NUTRITION STATUS      Continent Diet (Regular diet)  AMBULATORY STATUS COMMUNICATION OF NEEDS Skin   Limited Assist Verbally PU Stage and Appropriate Care       PU Stage 4 Dressing: Daily (Posterior rt heel pressure injury: daily cleanse with NS followed by application of leptospermum Manuka honey (MediHoney). To be covered with a dry gauze dressing & secured with silicone foam.)               Personal Care Assistance Level of Assistance  Bathing, Feeding, Dressing Bathing Assistance: Limited assistance Feeding assistance: Independent Dressing Assistance: Limited assistance     Functional Limitations Info  Sight, Hearing, Speech Sight Info: Adequate Hearing Info: Adequate Speech Info: Adequate    SPECIAL CARE FACTORS FREQUENCY  PT (By licensed PT), OT (By licensed OT)     PT Frequency: 5x's/week OT Frequency: 5x's/week            Contractures Contractures Info: Not present    Additional Factors Info  Code Status, Allergies, Psychotropic Code Status Info: Full Allergies Info: NKA Psychotropic Info: Zoloft         Current Medications (10/10/2022):  This is the current hospital active medication list Current Facility-Administered Medications  Medication Dose Route Frequency Provider Last Rate Last Admin   acetaminophen (TYLENOL) tablet 325-650 mg  325-650 mg Oral Q6H PRN Venita Lick, MD       acetaminophen (TYLENOL) tablet 650 mg  650 mg Oral PRN Venita Lick, MD       docusate sodium (COLACE) capsule 100 mg  100 mg Oral BID Venita Lick, MD   100 mg at 10/09/22 2102   enoxaparin (LOVENOX) injection 40 mg  40 mg Subcutaneous Q24H Venita Lick, MD   40 mg at 10/10/22 7846   flecainide (TAMBOCOR) tablet 50 mg  50 mg Oral BID Venita Lick, MD   50 mg at 10/10/22 0831   losartan (COZAAR) tablet 50 mg  50 mg Oral Daily Durene Romans, MD   50 mg at 10/10/22 0831   And   hydrochlorothiazide (HYDRODIURIL) tablet 12.5 mg  12.5 mg  Oral Daily Durene Romans, MD   12.5 mg at 10/10/22 0831   HYDROcodone-acetaminophen (NORCO) 7.5-325 MG per tablet 1-2 tablet  1-2 tablet Oral Q4H PRN Venita Lick, MD       HYDROcodone-acetaminophen (NORCO/VICODIN) 5-325 MG per tablet 1-2 tablet  1-2 tablet Oral Q4H PRN Venita Lick, MD       lactated ringers infusion   Intravenous Continuous Venita Lick, MD   Stopped at 10/08/22 1554   leptospermum manuka honey (MEDIHONEY) paste 1 Application  1 Application Topical Daily Durene Romans, MD   1 Application at 10/09/22 1310   methocarbamol (ROBAXIN) tablet 500 mg  500 mg Oral Q6H PRN Venita Lick, MD       Or   methocarbamol (ROBAXIN) 500 mg in dextrose 5 % 50 mL IVPB  500 mg Intravenous Q6H PRN Venita Lick, MD       metoCLOPramide (REGLAN) tablet 5-10 mg  5-10 mg Oral Q8H PRN Venita Lick, MD       Or   metoCLOPramide (REGLAN) injection 5-10 mg  5-10 mg Intravenous Q8H PRN Venita Lick, MD       morphine (PF)  2 MG/ML injection 0.5-1 mg  0.5-1 mg Intravenous Q2H PRN Venita Lick, MD       ondansetron [facility]) tablet 4 mg  4 mg Oral Q6H PRN Venita Lick, MD   4 mg at 10/08/22 0951   Or   ondansetron (ZOFRAN) injection 4 mg  4 mg Intravenous Q6H PRN Venita Lick, MD       ondansetron [facility]) tablet 4 mg  4 mg Oral Q6H PRN Venita Lick, MD       polyethylene glycol (MIRALAX / GLYCOLAX) packet 17 g  17 g Oral Daily Venita Lick, MD   17 g at 10/10/22 0830   rosuvastatin (CRESTOR) tablet 10 mg  10 mg Oral QHS Venita Lick, MD   10 mg at 10/09/22 2103   sertraline (ZOLOFT) tablet 25 mg  25 mg Oral Daily Venita Lick, MD   25 mg at 10/10/22 9147     Discharge Medications: Please see discharge summary for a list of discharge medications.  Relevant Imaging Results:  Relevant Lab Results:   Additional Information SSN: [id number]  Ewing Schlein, LCSW

## 2022-10-10 NOTE — Plan of Care (Signed)
?  Problem: Activity: ?Goal: Risk for activity intolerance will decrease ?Outcome: Progressing ?  ?Problem: Safety: ?Goal: Ability to remain free from injury will improve ?Outcome: Progressing ?  ?Problem: Pain Managment: ?Goal: General experience of comfort will improve ?Outcome: Progressing ?  ?

## 2022-10-10 NOTE — Progress Notes (Signed)
Subjective: 3 Days Post-Op Procedure(s) (LRB): IRRIGATION AND DEBRIDEMENT EXTREMITY WITH WOUND CLOSURE (Right) Patient reports pain as mild.   Patient seen in rounds for Dr. Charlann Boxer. Patient is resting in bed this morning on exam. He is understandably frustrated with the situation. He has failed this surgery twice now, and we reviewed the situation. He would like to go back to SNF, which we will talk with social work about.  We will start therapy today.   Objective: Vital signs in last 24 hours: Temp:  [98.3 F (36.8 C)-98.6 F (37 C)] 98.3 F (36.8 C) (07/15 1313) Pulse Rate:  [69-71] 71 (07/15 1313) Resp:  [17-18] 17 (07/15 1313) BP: (121-143)/(50-62) 121/50 (07/15 1313) SpO2:  [95 %-96 %] 96 % (07/15 1313)  Intake/Output from previous day:  Intake/Output Summary (Last 24 hours) at 10/10/2022 1417 Last data filed at 10/10/2022 1257 Gross per 24 hour  Intake 600 ml  Output 1125 ml  Net -525 ml     Intake/Output this shift: Total I/O In: 240 [P.O.:240] Out: 100 [Urine:100]  Labs: No results for input(s): "HGB" in the last 72 hours. No results for input(s): "WBC", "RBC", "HCT", "PLT" in the last 72 hours. No results for input(s): "NA", "K", "CL", "CO2", "BUN", "CREATININE", "GLUCOSE", "CALCIUM" in the last 72 hours. No results for input(s): "LABPT", "INR" in the last 72 hours.  Exam: General - Patient is Alert and Oriented Extremity - Neurologically intact Sensation intact distally Intact pulses distally Dorsiflexion/Plantar flexion intact Dressing - Knee immobilizer in place. Incisional vac in place, with good suction but minimal output. Motor Function - intact, moving foot and toes well on exam.   Past Medical History:  Diagnosis Date   Acquired clawfoot, right foot 12/16/2021   Anxiety    Atrophy of calf muscles 10/21/2021   Duplex calves was negative for significant blockage EMG was done at [facility] MRI lumbar spine was also done at [facility]   Carotid  artery occlusion    CKD (chronic kidney disease)    Stage 3   COPD (chronic obstructive pulmonary disease) (HCC)    Depression    Dysrhythmia    PAF, atrial tachycardia   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   The spinal cord appears normal. 2.   No spinal stenosis. 3.   Mild multilevel degenerative changes as detailed above that do not lead to spinal stenosis or nerve root compression. 4.   T2 hyperintense foci within the pons consistent with chronic microvascular ischemic changes.   Loop - Medtronic Linq 10/22/2020 10/22/2020   Nocturia    Paroxysmal atrial fibrillation (HCC)    Peripheral neuropathy    Peripheral vascular disease (HCC)    Sleep apnea    on 6 cm, nasal pillow   Spinal stenosis    Stroke (HCC) 05/01/2011   ischemic   Weakness generalized 10/21/2021   Severe, legs and arms    Assessment/Plan: 3 Days Post-Op Procedure(s) (LRB): IRRIGATION AND DEBRIDEMENT EXTREMITY WITH WOUND CLOSURE (Right) Principal Problem:   Wound dehiscence  Estimated body mass index is 26.62 kg/m as calculated from the following:   Height as of this encounter: 5\' 8"  (1.727 m).   Weight as of this encounter: 79.4  kg. 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   We have discussed with Dr. Aundria Rud who will see the patient in the office in 2 weeks to discuss alternate surgical options vs non-op management   For now, will keep in knee immobilizer which has worked better for him than TROM.   He will go to SNF once bed placement obtained.   Rosalene Billings, PA-C Orthopedic  Surgery [phone] 10/10/2022, 2:17 PM

## 2022-10-10 NOTE — Evaluation (Signed)
Physical Therapy Evaluation Patient Details Name: Brandon Mendoza. MRN: [id number] DOB: [birth year] Today's Date: 10/10/2022  History of Present Illness  82 yo male admitted secondsary to R knee wound dehiscence and recurrent rupture of his quadriceps tendon; pt is s/p I &D R knee wound on 10/07/22. PMH: quad tendon repair x3, COPD, macular degeneration, CVA, neuropathy, spinal stenosis, lumbar fusion, afib  Clinical Impression  Pt admitted with above diagnosis.  Pt agreeable to PT, reviewed precautions and MD orders with pt. Pt is motivated to work with PT, deferred OOB given orders to avoid quad activation. Will benefit from SNF post acute, will follow acutely   Pt currently with functional limitations due to the deficits listed below (see PT Problem List). Pt will benefit from acute skilled PT to increase their independence and safety with mobility to allow discharge.           Assistance Recommended at Discharge Intermittent Supervision/Assistance  If plan is discharge home, recommend the following:  Can travel by private vehicle  Assistance with cooking/housework;Help with stairs or ramp for entrance;A little help with walking and/or transfers;A little help with bathing/dressing/bathroom;Assist for transportation   No    Equipment Recommendations None recommended by PT  Recommendations for Other Services       Functional Status Assessment Patient has had a recent decline in their functional status and demonstrates the ability to make significant improvements in function in a reasonable and predictable amount of time.     Precautions / Restrictions Precautions Precautions: Fall Required Braces or Orthoses: Knee Immobilizer - Right;Other Brace Knee Immobilizer - Right: On at all times Restrictions Weight Bearing Restrictions: No RLE Weight Bearing: Weight bearing as tolerated Other Position/Activity Restrictions: "completely avoid quad activation, bending knee, using leg to  push up out of bed/chair"      Mobility  Bed Mobility Overal bed mobility: Needs Assistance Bed Mobility: Supine to Sit, Sit to Supine     Supine to sit: Min guard, Min assist Sit to supine: Min guard, Min assist   General bed mobility comments: encouraged pt to allow PT to support RLE to avoid quad activation    Transfers                   General transfer comment: deferred d/t orders for avoiding quad activation (pt reports he was up to [facility] with nursing staff earlier)    Ambulation/Gait               General Gait Details: deferred  Stairs            Wheelchair Mobility     Tilt Bed    Modified Rankin (Stroke Patients Only)       Balance   Sitting-balance support: Feet supported, No upper extremity supported Sitting balance-Leahy Scale: Good                                       Pertinent Vitals/Pain Pain Assessment Pain Assessment: 0-10 Pain Score: 2  Pain Location: R knee with activity Pain Descriptors / Indicators: Discomfort Pain Intervention(s): Limited activity within patient's tolerance, Monitored during session, Repositioned    Home Living Family/patient expects to be discharged to:: Skilled nursing facility                        Prior Function  Hand Dominance        Extremity/Trunk Assessment   Upper Extremity Assessment Upper Extremity Assessment: Overall WFL for tasks assessed    Lower Extremity Assessment Lower Extremity Assessment: LLE deficits/detail;RLE deficits/detail RLE Deficits / Details: ankle edematous, ROM WFL RLE: Unable to fully assess due to immobilization       Communication      Cognition Arousal/Alertness: Awake/alert Behavior During Therapy: WFL for tasks assessed/performed Overall Cognitive Status: Within Functional Limits for tasks assessed                                 General Comments: Pleasant/motivated.  Lives  home with Spouse but she Physically can not asisst due to Hx back surgery        General Comments      Exercises General Exercises - Lower Extremity Ankle Circles/Pumps: AROM, Right, 5 reps   Assessment/Plan    PT Assessment Patient needs continued PT services  PT Problem List Decreased strength;Decreased range of motion;Decreased activity tolerance;Decreased knowledge of use of DME;Decreased balance;Decreased mobility;Decreased knowledge of precautions;Decreased safety awareness;Decreased coordination       PT Treatment Interventions DME instruction;Therapeutic exercise;Wheelchair mobility training;Balance training;Gait training;Stair training;Modalities;Functional mobility training;Therapeutic activities;Patient/family education    PT Goals (Current goals can be found in the Care Plan section)  Acute Rehab PT Goals Patient Stated Goal: heal R LE PT Goal Formulation: With patient Time For Goal Achievement: 10/24/22 Potential to Achieve Goals: Good    Frequency Min 1X/week     Co-evaluation               AM-PAC PT "6 Clicks" Mobility  Outcome Measure Help needed turning from your back to your side while in a flat bed without using bedrails?: A Little Help needed moving from lying on your back to sitting on the side of a flat bed without using bedrails?: A Little Help needed moving to and from a bed to a chair (including a wheelchair)?: A Little Help needed standing up from a chair using your arms (e.g., wheelchair or bedside chair)?: A Little Help needed to walk in hospital room?: A Little Help needed climbing 3-5 steps with a railing? : Total 6 Click Score: 16    End of Session Equipment Utilized During Treatment: Right knee immobilizer Activity Tolerance: Patient tolerated treatment well Patient left: with call bell/phone within reach;with bed alarm set;in bed Nurse Communication: Mobility status PT Visit Diagnosis: Other abnormalities of gait and mobility  (R26.89);Muscle weakness (generalized) (M62.81)    Time: 4098-1191 PT Time Calculation (min) (ACUTE ONLY): 20 min   Charges:   PT Evaluation $PT Eval Low Complexity: 1 Low   PT General Charges $$ ACUTE PT VISIT: 1 Visit         Camree Wigington, PT  Acute Rehab Dept [facility]) [phone]  10/10/2022   [facility] 10/10/2022, 11:21 AM

## 2022-10-10 NOTE — TOC Initial Note (Signed)
Transition of Care (TOC) - Initial/Assessment Note   Patient Details  Name: Brandon Mendoza. MRN: [id number] Date of Birth: 1941-01-10  Transition of Care [facility]) CM/SW Contact:    Ewing Schlein, LCSW Phone Number: 10/10/2022, 10:44 AM  Clinical Narrative: PT evaluation recommended SNF. Patient is agreeable to SNF and requested [facility] as first choice. FL2 done; PASRR confirmed. Initial referral faxed to [facility] and [facility] as patient is in copay days and will need a SNF that accepts his [employer] supplemental plan.  CSW followed up with [place] in admissions at [facility]. Per Alphonzo Lemmings, she was not able to confirm if a bed will become available but will notify CSW if the facility cannot accept the patient. TOC awaiting bed offers.                 Expected Discharge Plan: Skilled Nursing Facility Barriers to Discharge: SNF Pending bed offer  Patient Goals and CMS Choice Patient states their goals for this hospitalization and ongoing recovery are:: Discharge to [facility] for SNF CMS Medicare.gov Compare Post Acute Care list provided to:: Patient Choice offered to / list presented to : Patient  Expected Discharge Plan and Services In-house Referral: Clinical Social Work Post Acute Care Choice: Skilled Nursing Facility Living arrangements for the past 2 months: Single Family Home  Prior Living Arrangements/Services Living arrangements for the past 2 months: Single Family Home Lives with:: Spouse Patient language and need for interpreter reviewed:: Yes Do you feel safe going back to the place where you live?: Yes (After completing short-term rehab.)      Need for Family Participation in Patient Care: Yes (Comment) Care giver support system in place?: Yes (comment) Criminal Activity/Legal Involvement Pertinent to Current Situation/Hospitalization: No - Comment as needed  Activities of Daily Living Home Assistive Devices/Equipment: Dan Humphreys (specify type) ADL Screening (condition at time  of admission) Patient's cognitive ability adequate to safely complete daily activities?: Yes Is the patient deaf or have difficulty hearing?: No Does the patient have difficulty seeing, even when wearing glasses/contacts?: No Does the patient have difficulty concentrating, remembering, or making decisions?: No Patient able to express need for assistance with ADLs?: No Does the patient have difficulty dressing or bathing?: No Independently performs ADLs?: No Communication: Independent Dressing (OT): Independent Is this a change from baseline?: Pre-admission baseline Grooming: Independent Feeding: Independent Bathing: Needs assistance, Independent Is this a change from baseline?: Change from baseline, expected to last >3 days Toileting: Needs assistance, Independent Is this a change from baseline?: Change from baseline, expected to last >3days In/Out Bed: Needs assistance, Independent Is this a change from baseline?: Change from baseline, expected to last >3 days Walks in Home: Independent with device (comment) Is this a change from baseline?: Pre-admission baseline Does the patient have difficulty walking or climbing stairs?: Yes Weakness of Legs: Both Weakness of Arms/Hands: None  Permission Sought/Granted Permission sought to share information with : Oceanographer granted to share information with : Yes, Verbal Permission Granted Permission granted to share info w AGENCY: SNFs  Emotional Assessment Appearance:: Appears stated age Attitude/Demeanor/Rapport: Engaged Affect (typically observed): Frustrated, Accepting Orientation: : Oriented to Self, Oriented to Place, Oriented to  Time, Oriented to Situation Alcohol / Substance Use: Not Applicable Psych Involvement: No (comment)  Admission diagnosis:  Surgical wound dehiscence, initial encounter [T81.31XA] Wound dehiscence [T81.30XA] Patient Active Problem List   Diagnosis Date Noted   Wound  dehiscence 10/07/2022   Quadriceps tendon rupture, right, subsequent encounter 08/30/2022   Quadriceps tendon  rupture, right, initial encounter 07/12/2022   Unable to care for self 07/08/2022   Dehydration 07/08/2022   Depression 07/08/2022   COPD (chronic obstructive pulmonary disease) (HCC) 07/08/2022   Macular degeneration 03/01/2022   Myofascial pain 02/10/2022   Lumbar spondylosis 01/03/2022   Acquired clawfoot, left foot 12/16/2021   Acquired clawfoot, right foot 12/16/2021   Claw foot 12/14/2021   Gait abnormality 12/10/2021   Spinal stenosis at L4-L5 level 12/10/2021   Ataxia 11/22/2021   Lightheadedness 11/02/2021   Claudication of lower extremity (HCC) 10/21/2021   Atrophy of calf muscles 10/21/2021   Weakness generalized 10/21/2021   Focal neurological deficit 10/21/2021   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   Pain in joint of right hip 09/14/2021   High arches 07/22/2021   Hammertoes of both feet 07/22/2021   Neuropathy 07/22/2021   Constipation 05/28/2021   Disequilibrium 05/28/2021   Personal history of colonic polyps 05/28/2021   Renal cyst 05/28/2021   Gastroesophageal reflux disease without esophagitis 05/05/2021   Ingrowing left great toenail 02/15/2021   S/P lumbar fusion 12/24/2020   Spinal stenosis of lumbar region 12/04/2020   Acquired complex renal cyst 11/23/2020   Functional gait abnormality 11/04/2020   Idiopathic neuropathy 11/02/2020   Tinea pedis of both feet 10/28/2020   Loop - Medtronic Linq 10/22/2020 10/22/2020   Benign hypertension with 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) 10/05/2020   Fall with injury 10/05/2020   Hematoma 09/16/2020   Normocytic anemia 09/14/2020   Leukocytosis 09/14/2020   AF (paroxysmal atrial fibrillation) (HCC) 09/14/2020   Chronic anticoagulation 09/14/2020   Hyponatremia 09/14/2020   Pain in left foot 01/22/2020   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   Pain in joint  of right knee 12/27/2019   Recurrent falls 11/17/2019   Paroxysmal atrial fibrillation (HCC) 10/22/2019   Pain in right foot 10/14/2019   Falls 10/07/2019   Ataxia involving legs 08/12/2019   OSA on CPAP 06/20/2018   Urge incontinence 04/13/2018   Urinary urgency 04/13/2018   Pain in left knee 03/01/2018   Other intervertebral disc degeneration, lumbar region 06/16/2017   Acquired bilateral hammer toes 10/05/2015   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   Generalized anxiety disorder 05/12/2015   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 [place] [address] Phone: [phone] Fax: [phone]  [id number] [place], [place] - [address] AT [facility] [address] [place] [address] Phone: [phone] Fax: [phone]  [facility] - [place], [place] - [address] [place] [address] Phone: [phone] Fax: [phone]  Social Determinants of Health (SDOH) Social History: SDOH Screenings   Food Insecurity: No Food Insecurity (10/08/2022)  Housing: Low Risk  (10/08/2022)  Transportation Needs: No Transportation Needs (10/08/2022)  Utilities: Not At Risk (10/08/2022)  Depression (PHQ2-9): Low Risk  (12/10/2021)  Social Connections: Unknown (08/10/2021)   Received from  [facility]  Tobacco Use: Medium Risk (10/07/2022)    SDOH Interventions:    Readmission Risk Interventions    07/09/2022    1:44 PM  Readmission Risk Prevention Plan  Transportation Screening Complete  PCP or Specialist Appt within 5-7 Days Complete  Home Care Screening Complete  Medication Review (RN CM) Complete

## 2022-10-11 ENCOUNTER — Telehealth: Payer: Self-pay | Admitting: Podiatry

## 2022-10-11 MED ORDER — ASPIRIN 81 MG PO CHEW
81.0000 mg | CHEWABLE_TABLET | Freq: Two times a day (BID) | ORAL | Status: DC
  Administered 2022-10-11 – 2022-10-18 (×15): 81 mg via ORAL
  Filled 2022-10-11 (×15): qty 1

## 2022-10-11 MED ORDER — ASPIRIN 81 MG PO CHEW: 81.0000 mg | CHEWABLE_TABLET | Freq: Two times a day (BID) | ORAL | 0 refills | Status: AC

## 2022-10-11 MED ORDER — HYDROCODONE-ACETAMINOPHEN 5-325 MG PO TABS: 1.0000 | ORAL_TABLET | Freq: Three times a day (TID) | ORAL | 0 refills | Status: DC | PRN

## 2022-10-11 NOTE — Telephone Encounter (Signed)
Pt called and has torn a tendon in his leg and is in the hospital and was looking at the toe that the surgery was done on in March and says it does not look good it looks bruised and he can see the rod that you put in moving when he moves his toes. He was asking if you did hospital visits and I explained that we have a doctor on call that does all the hospital visits but they must be consulted to make a hospital visit. He was going to talk with the doctor overseeing his care at the hospital and ask about getting a consult.

## 2022-10-11 NOTE — Care Management Important Message (Signed)
Important Message  Patient Details IM Letter given. Name: Brandon Mendoza. MRN: [id number] Date of Birth: 06-07-1940   Medicare Important Message Given:  Yes     [name] 10/11/2022, 9:27 AM

## 2022-10-11 NOTE — Progress Notes (Signed)
Physical Therapy Treatment Patient Details Name: Brandon Mendoza. MRN: [id number] DOB: 02/06/1941 Today's Date: 10/11/2022   History of Present Illness 82 yo male admitted secondsary to R knee wound dehiscence and recurrent rupture of his quadriceps tendon; pt is s/p I &D R knee wound on 10/07/22. PMH: quad tendon repair x3, COPD, macular degeneration, CVA, neuropathy, spinal stenosis, lumbar fusion, afib    PT Comments  Pt able to stand and perform stand-step pivot transfer to chair  with min assist. Discussion regarding precautions, avoiding any knee flexion as well avoiding quad activation as much as possible. Pt verbalizes understanding this session. Pt was able to adhere to TDWB to very light PWB as per Dr. Boneta Lucks rec's. Continue PT in acute setting. D/c plan remains appropriate.     Assistance Recommended at Discharge Intermittent Supervision/Assistance  If plan is discharge home, recommend the following:  Can travel by private vehicle    Assistance with cooking/housework;Help with stairs or ramp for entrance;A little help with walking and/or transfers;A little help with bathing/dressing/bathroom;Assist for transportation   No  Equipment Recommendations  None recommended by PT    Recommendations for Other Services       Precautions / Restrictions Precautions Precautions: Fall Precaution Comments: NO ROM R knee; reviewed knee precautions, avoiding quad activation. pt verbalizes understanding Required Braces or Orthoses: Knee Immobilizer - Right Knee Immobilizer - Right: On at all times Restrictions RLE Weight Bearing: Touchdown weight bearing Other Position/Activity Restrictions: "completely avoid quad activation, bending knee, using leg to push up out of bed/chair" Per Dr Nilsa Nutting progress noe today pt is TDWB to [facility] (10/11/22)     Mobility  Bed Mobility Overal bed mobility: Needs Assistance Bed Mobility: Supine to Sit     Supine to sit: Min guard, Min assist      General bed mobility comments: encouraged pt to allow PT to support RLE to avoid quad activation    Transfers   Equipment used: Rolling walker (2 wheels) Transfers: Sit to/from Stand Sit to Stand: Min guard, Min assist   Step pivot transfers: Min assist       General transfer comment: assist to steady and safely maneuver RW    Ambulation/Gait                   Stairs             Wheelchair Mobility     Tilt Bed    Modified Rankin (Stroke Patients Only)       Balance   Sitting-balance support: Feet supported, No upper extremity supported Sitting balance-Leahy Scale: Good     Standing balance support: Bilateral upper extremity supported, Reliant on assistive device for balance Standing balance-Leahy Scale: Poor Standing balance comment: reliant on UEs and light external assist                            Cognition Arousal/Alertness: Awake/alert Behavior During Therapy: WFL for tasks assessed/performed Overall Cognitive Status: Within Functional Limits for tasks assessed                                 General Comments: Pleasant/motivated.  Lives home with Spouse but she Physically can not asisst due to Hx back surgery        Exercises      General Comments        Pertinent Vitals/Pain Pain Assessment  Pain Assessment: No/denies pain    Home Living                          Prior Function            PT Goals (current goals can now be found in the care plan section) Acute Rehab PT Goals Patient Stated Goal: heal R LE PT Goal Formulation: With patient Time For Goal Achievement: 10/24/22 Potential to Achieve Goals: Good Progress towards PT goals: Progressing toward goals    Frequency    Min 1X/week      PT Plan Current plan remains appropriate    Co-evaluation              AM-PAC PT "6 Clicks" Mobility   Outcome Measure  Help needed turning from your back to your side while in  a flat bed without using bedrails?: A Little Help needed moving from lying on your back to sitting on the side of a flat bed without using bedrails?: A Little Help needed moving to and from a bed to a chair (including a wheelchair)?: A Little Help needed standing up from a chair using your arms (e.g., wheelchair or bedside chair)?: A Little Help needed to walk in hospital room?: A Little Help needed climbing 3-5 steps with a railing? : Total 6 Click Score: 16    End of Session Equipment Utilized During Treatment: Gait belt;Right knee immobilizer Activity Tolerance: Patient tolerated treatment well Patient left: in chair;with call bell/phone within reach;with chair alarm set   PT Visit Diagnosis: Other abnormalities of gait and mobility (R26.89);Muscle weakness (generalized) (M62.81)     Time: 5573-2202 PT Time Calculation (min) (ACUTE ONLY): 14 min  Charges:    $Therapeutic Activity: 8-22 mins PT General Charges $$ ACUTE PT VISIT: 1 Visit                     [name], PT  Acute Rehab Dept [facility]) [phone]  10/11/2022    [facility] 10/11/2022, 5:32 PM

## 2022-10-11 NOTE — TOC Progression Note (Signed)
Transition of Care (TOC) - Progression Note   Patient Details  Name: Brandon Mendoza. MRN: [id number] Date of Birth: [birth year]  Transition of Care [facility]) CM/SW Contact  Ewing Schlein, LCSW Phone Number: 10/11/2022, 12:55 PM  Clinical Narrative: Per Alphonzo Lemmings in admissions at [facility], the facility cannot accept the patient as there are no rehab beds currently available and a bed will not open up for quite some time. CSW followed up with [place] in admissions at [facility]. Per [place], the referral will be reviewed tomorrow to see if she will have a bed opening in the next few days. CSW updated patient and ortho PA. Awaiting bed offer.  Expected Discharge Plan: Skilled Nursing Facility Barriers to Discharge: SNF Pending bed offer  Expected Discharge Plan and Services In-house Referral: Clinical Social Work Post Acute Care Choice: Skilled Nursing Facility Living arrangements for the past 2 months: Single Family Home  Social Determinants of Health (SDOH) Interventions SDOH Screenings   Food Insecurity: No Food Insecurity (10/08/2022)  Housing: Low Risk  (10/08/2022)  Transportation Needs: No Transportation Needs (10/08/2022)  Utilities: Not At Risk (10/08/2022)  Depression (PHQ2-9): Low Risk  (12/10/2021)  Social Connections: Unknown (08/10/2021)   Received from [facility]  Tobacco Use: Medium Risk (10/07/2022)   Readmission Risk Interventions    07/09/2022    1:44 PM  Readmission Risk Prevention Plan  Transportation Screening Complete  PCP or Specialist Appt within 5-7 Days Complete  Home Care Screening Complete  Medication Review (RN CM) Complete

## 2022-10-11 NOTE — Progress Notes (Signed)
Patient ID: Brandon Brass., male   DOB: Nov 14, 1940, 82 y.o.   MRN: [id number] Subjective: 4 Days Post-Op Procedure(s) (LRB): IRRIGATION AND DEBRIDEMENT EXTREMITY WITH WOUND CLOSURE (Right)    Patient reports pain as mild.  Objective:   VITALS:   Vitals:   10/10/22 2140 10/11/22 0614  BP: 124/65 (!) 142/79  Pulse: 69 70  Resp: 17 17  Temp: 97.9 F (36.6 C) 97.8 F (36.6 C)  SpO2: 96% 97%    Neurovascular intact Incision: dressing C/D/I Right heel wound, superficial erythematous, no evidence of necrosis No drainage  LABS No results for input(s): "HGB", "HCT", "WBC", "PLT" in the last 72 hours.  No results for input(s): "NA", "K", "BUN", "CREATININE", "GLUCOSE" in the last 72 hours.  No results for input(s): "LABPT", "INR" in the last 72 hours.   Assessment/Plan: 4 Days Post-Op Procedure(s) (LRB): IRRIGATION AND DEBRIDEMENT EXTREMITY WITH WOUND CLOSURE (Right)   Up with therapy   This morning I had a lengthy discussion with him regarding the condition of his right lower extremity.  Obviously he is extremely frustrated with the recurrent disruption of his quadricep tendon from the patella.  We discussed and reviewed options for the present as well as for his follow-up plan with Dr. Aundria Rud. After our discussion today the plan will be to remove his Prevana surgical dressing likely tomorrow and replace it with an Aquacel dressing.  He will then be in maintained in a knee immobilizer for the primary reason of allowing his right heel to be managed per the direction of the wound care team consult.  I discussed with him that his mobility will be limited in the knee immobilizer and that he cannot expect to have any strength or stability with the however with the knee immobilizer in place he could do toe-touch or partial weightbearing for balance support as long as he was fully supported by his contralateral lower extremity in his upper extremities.  He unfortunately is going to  require short-term nursing facility stay.  He is encouraged to work very hard to improve his functionality in an effort to get him.  The plan at this point is for him to follow-up with Dr. Aundria Rud in approximately 2 weeks to evaluate his wound and also discuss surgical intervention.  I briefly touched on the possibility of surgical reconstruction with him with or without allograft reconstruction or augmentation.  We discussed that he would likely be immobilized for anywhere from 6 to 12 weeks.  This may be in a cylindrical cast for weightbearing purposes or in some sort of a splint or brace option.  Obviously the postoperative plan will be directed by Dr. Aundria Rud at that point.

## 2022-10-12 LAB — AEROBIC/ANAEROBIC CULTURE W GRAM STAIN (SURGICAL/DEEP WOUND): Culture: NO GROWTH

## 2022-10-12 NOTE — TOC Progression Note (Signed)
Transition of Care (TOC) - Progression Note   Patient Details  Name: Brandon Mendoza. MRN: [id number] Date of Birth: 06/21/1940  Transition of Care [facility]) CM/SW Contact  Ewing Schlein, LCSW Phone Number: 10/12/2022, 12:55 PM  Clinical Narrative: CSW followed up with [place] in admissions at [facility] regarding SNF referral. [facility] is unable to to make a bed offer at this time. CSW reached out to [place] in admissions at [facility] to see if a bed will become available soon. Per [place], a bed is not expected to open up until next week but she will notify CSW if a bed is available sooner. CSW updated ortho PA and patient.    Expected Discharge Plan: Skilled Nursing Facility Barriers to Discharge: SNF Pending bed offer  Expected Discharge Plan and Services In-house Referral: Clinical Social Work Post Acute Care Choice: Skilled Nursing Facility Living arrangements for the past 2 months: Single Family Home  Social Determinants of Health (SDOH) Interventions SDOH Screenings   Food Insecurity: No Food Insecurity (10/08/2022)  Housing: Low Risk  (10/08/2022)  Transportation Needs: No Transportation Needs (10/08/2022)  Utilities: Not At Risk (10/08/2022)  Depression (PHQ2-9): Low Risk  (12/10/2021)  Social Connections: Unknown (08/10/2021)   Received from [facility]  Tobacco Use: Medium Risk (10/07/2022)   Readmission Risk Interventions    07/09/2022    1:44 PM  Readmission Risk Prevention Plan  Transportation Screening Complete  PCP or Specialist Appt within 5-7 Days Complete  Home Care Screening Complete  Medication Review (RN CM) Complete

## 2022-10-12 NOTE — Progress Notes (Signed)
Patient ID: Brandon Brass., male   DOB: [birth year], 82 y.o.   MRN: [id number]  Today I removed his Prevana dressing and applied gauze and bulky dressing (vs aquacell) due to mild bloody drainage at wound site.  Will re consider aquacell in a couple days

## 2022-10-12 NOTE — Plan of Care (Signed)
   Problem: Education: Goal: Knowledge of General Education information will improve Description: Including pain rating scale, medication(s)/side effects and non-pharmacologic comfort measures Outcome: Progressing   Problem: Activity: Goal: Risk for activity intolerance will decrease Outcome: Progressing   Problem: Pain Managment: Goal: General experience of comfort will improve Outcome: Progressing   

## 2022-10-13 NOTE — Progress Notes (Signed)
Physical Therapy Treatment Patient Details Name: Brandon Mendoza. 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 PMH: quad tendon repair x3, COPD, macular degeneration, CVA, neuropathy, spinal stenosis, lumbar fusion, afib    PT Comments  Pt progressing nicely this session; amb short distance with cues for sequence, minimal wt on RLE, assist to steady throughout. KI adjusted/padded d/t lower leg edema. See below for details.  D/c plan remains appropriate. Continue to follow acutely     Assistance Recommended at Discharge Intermittent Supervision/Assistance  If plan is discharge home, recommend the following:  Can travel by private vehicle    Assistance with cooking/housework;Help with stairs or ramp for entrance;A little help with walking and/or transfers;A little help with bathing/dressing/bathroom;Assist for transportation   No  Equipment Recommendations  None recommended by PT    Recommendations for Other Services       Precautions / Restrictions Precautions Precautions: Fall Precaution Comments: NO ROM R knee; reviewed knee precautions, avoiding quad activation. pt verbalizes understanding Required Braces or Orthoses: Knee Immobilizer - Right Knee Immobilizer - Right: On at all times Other Brace: noted significant R distal LE and foot edema; padded posterior and distal strap left loose to allow blood flow-RN and NT aware to tighten strap prior to standing Restrictions Weight Bearing Restrictions: No RLE Weight Bearing: Touchdown weight bearing Other Position/Activity Restrictions: "completely avoid quad activation, bending knee, using leg to push up out of bed/chair" Per Dr Nilsa Nutting progress note today pt is [facility] to [facility] (10/11/22)     Mobility  Bed Mobility Overal bed mobility: Needs  Assistance Bed Mobility: Supine to Sit     Supine to sit: Min guard     General bed mobility comments: encouraged pt to allow PT to support RLE to avoid quad activation    Transfers Overall transfer level: Needs assistance Equipment used: Rolling walker (2 wheels) Transfers: Sit to/from Stand Sit to Stand: Min assist, Min guard, From elevated surface           General transfer comment: assist to steady and safely maneuver RW, cues to avoid any pushing with RLE    Ambulation/Gait Ambulation/Gait assistance: Min assist Gait Distance (Feet): 20 Feet Assistive device: Rolling walker (2 wheels)         General Gait Details: cues for sequence, minimal Wt on RLE and incr use UEs to off load RLE during stance   Stairs             Wheelchair Mobility     Tilt Bed    Modified Rankin (Stroke Patients Only)       Balance                                            Cognition Arousal/Alertness: Awake/alert Behavior During Therapy: WFL for tasks assessed/performed Overall Cognitive Status: Within Functional Limits for tasks assessed                                 General Comments: Pleasant/motivated.  Lives home with Spouse but she Physically can not asisst due to Hx back surgery        Exercises  General Exercises - Lower Extremity Ankle Circles/Pumps: AROM, Both, 10 reps    General Comments        Pertinent Vitals/Pain Pain Assessment Pain Assessment: Faces Faces Pain Scale: Hurts a little bit Pain Location: R knee with activity Pain Descriptors / Indicators: Discomfort Pain Intervention(s): Limited activity within patient's tolerance, Monitored during session    Home Living                          Prior Function            PT Goals (current goals can now be found in the care plan section) Acute Rehab PT Goals Patient Stated Goal: heal R LE PT Goal Formulation: With patient Time For Goal  Achievement: 10/24/22 Potential to Achieve Goals: Good Progress towards PT goals: Progressing toward goals    Frequency    Min 1X/week      PT Plan Current plan remains appropriate    Co-evaluation              AM-PAC PT "6 Clicks" Mobility   Outcome Measure  Help needed turning from your back to your side while in a flat bed without using bedrails?: A Little Help needed moving from lying on your back to sitting on the side of a flat bed without using bedrails?: A Little Help needed moving to and from a bed to a chair (including a wheelchair)?: A Little Help needed standing up from a chair using your arms (e.g., wheelchair or bedside chair)?: A Little Help needed to walk in hospital room?: A Little Help needed climbing 3-5 steps with a railing? : Total 6 Click Score: 16    End of Session Equipment Utilized During Treatment: Gait belt;Right knee immobilizer Activity Tolerance: Patient tolerated treatment well Patient left: in chair;with call bell/phone within reach;with chair alarm set Nurse Communication: Mobility status;Other (comment) (KI distal strap loose d/t edema) PT Visit Diagnosis: Other abnormalities of gait and mobility (R26.89);Muscle weakness (generalized) (M62.81)     Time: 4696-2952 PT Time Calculation (min) (ACUTE ONLY): 18 min  Charges:    $Gait Training: 8-22 mins PT General Charges $$ ACUTE PT VISIT: 1 Visit                     Geraline Halberstadt, PT  Acute Rehab Dept [facility]) [phone]  10/13/2022    [facility] 10/13/2022, 11:56 AM

## 2022-10-13 NOTE — Plan of Care (Signed)
°  Problem: Education: °Goal: Knowledge of General Education information will improve °Description: Including pain rating scale, medication(s)/side effects and non-pharmacologic comfort measures °Outcome: Progressing °  °Problem: Clinical Measurements: °Goal: Ability to maintain clinical measurements within normal limits will improve °Outcome: Progressing °  °Problem: Elimination: °Goal: Will not experience complications related to urinary retention °Outcome: Progressing °  °Problem: Pain Managment: °Goal: General experience of comfort will improve °Outcome: Progressing °  °Problem: Safety: °Goal: Ability to remain free from injury will improve °Outcome: Progressing °  °

## 2022-10-13 NOTE — Progress Notes (Signed)
Subjective: 6 Days Post-Op Procedure(s) (LRB): IRRIGATION AND DEBRIDEMENT EXTREMITY WITH WOUND CLOSURE (Right) Patient reports pain as mild.   Patient seen in rounds for Dr. Charlann Boxer. No acute events overnight.  Awaiting placement at SNF.  Objective: Vital signs in last 24 hours: Temp:  [97.4 F (36.3 C)-98.6 F (37 C)] 98 F (36.7 C) (07/18 0534) Pulse Rate:  [67-73] 73 (07/18 0534) Resp:  [16-18] 17 (07/18 0534) BP: (124-151)/(62-67) 124/63 (07/18 0534) SpO2:  [96 %-99 %] 99 % (07/18 0534)  Intake/Output from previous day:  Intake/Output Summary (Last 24 hours) at 10/13/2022 0715 Last data filed at 10/13/2022 0600 Gross per 24 hour  Intake 1296.5 ml  Output 1050 ml  Net 246.5 ml     Intake/Output this shift: No intake/output data recorded.  Labs: No results for input(s): "HGB" in the last 72 hours. No results for input(s): "WBC", "RBC", "HCT", "PLT" in the last 72 hours. No results for input(s): "NA", "K", "CL", "CO2", "BUN", "CREATININE", "GLUCOSE", "CALCIUM" in the last 72 hours. No results for input(s): "LABPT", "INR" in the last 72 hours.  Exam: General - Patient is Alert and Oriented Extremity - Neurologically intact Sensation intact distally Intact pulses distally Dorsiflexion/Plantar flexion intact Dressing - dressing C/D/I Motor Function - intact, moving foot and toes well on exam.   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   The spinal cord appears normal. 2.   No spinal stenosis. 3.   Mild multilevel degenerative changes as detailed above that do not lead to spinal stenosis or nerve root compression. 4.   T2 hyperintense foci within the pons consistent with chronic microvascular ischemic changes.   Loop - Medtronic Linq 10/22/2020 10/22/2020   Nocturia    Paroxysmal atrial fibrillation (HCC)    Peripheral neuropathy    Peripheral vascular disease (HCC)    Sleep apnea    on 6 cm, nasal pillow   Spinal stenosis    Stroke (HCC) 05/01/2011   ischemic   Weakness generalized 10/21/2021   Severe, legs and arms    Assessment/Plan: 6 Days Post-Op Procedure(s) (LRB): IRRIGATION AND DEBRIDEMENT EXTREMITY WITH WOUND CLOSURE (Right) Principal Problem:   Wound dehiscence  Estimated body mass index is 26.62 kg/m as calculated from the following:   Height as of this encounter: 5\' 8"  (1.727 m).   Weight as of this encounter: 79.4 kg. 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 Aundria Rud who will see the patient in the office in 2 weeks to discuss alternate surgical options vs non-op management   For now,  will keep in knee immobilizer which has worked better for him than TROM.    He will go to SNF once bed placement obtained.    Rosalene Billings, PA-C Orthopedic Surgery [phone] 10/13/2022, 7:15 AM

## 2022-10-13 NOTE — Plan of Care (Signed)
  Problem: Education: Goal: Knowledge of General Education information will improve Description: Including pain rating scale, medication(s)/side effects and non-pharmacologic comfort measures Outcome: Progressing   Problem: Activity: Goal: Risk for activity intolerance will decrease Outcome: Progressing   Problem: Pain Managment: Goal: General experience of comfort will improve Outcome: Progressing   

## 2022-10-14 MED ORDER — DULOXETINE HCL 30 MG PO CPEP
30.0000 mg | ORAL_CAPSULE | Freq: Every day | ORAL | Status: DC
  Administered 2022-10-14 – 2022-10-17 (×4): 30 mg via ORAL
  Filled 2022-10-14 (×4): qty 1

## 2022-10-14 MED ORDER — ADULT MULTIVITAMIN W/MINERALS CH
1.0000 | ORAL_TABLET | Freq: Every day | ORAL | Status: DC
  Administered 2022-10-14 – 2022-10-18 (×5): 1 via ORAL
  Filled 2022-10-14 (×5): qty 1

## 2022-10-14 NOTE — Plan of Care (Signed)
Problem: Education: Goal: Knowledge of General Education information will improve Description: Including pain rating scale, medication(s)/side effects and non-pharmacologic comfort measures Outcome: Progressing   Problem: Clinical Measurements: Goal: Ability to maintain clinical measurements within normal limits will improve Outcome: Progressing   Problem: Coping: Goal: Level of anxiety will decrease Outcome: Progressing   Problem: Pain Managment: Goal: General experience of comfort will improve Outcome: Progressing   Problem: Safety: Goal: Ability to remain free from injury will improve Outcome: Progressing   Haydee Salter, RN 10/14/22 7:51 AM

## 2022-10-14 NOTE — Progress Notes (Signed)
Subjective: 7 Days Post-Op Procedure(s) (LRB): IRRIGATION AND DEBRIDEMENT EXTREMITY WITH WOUND CLOSURE (Right) Patient reports pain as mild.   Patient seen in rounds with Dr. Charlann Boxer. Resting in bed on exam this morning. Increased pain today We removed his ACE wrap and he reported immediate relief Awaiting SNF We will continue therapy today.   Objective: Vital signs in last 24 hours: Temp:  [98 F (36.7 C)-98.1 F (36.7 C)] 98.1 F (36.7 C) (07/19 0510) Pulse Rate:  [62-100] 62 (07/19 0510) Resp:  [16-17] 16 (07/19 0510) BP: (122-135)/(61-78) 135/61 (07/19 0510) SpO2:  [95 %-97 %] 95 % (07/19 0510)  Intake/Output from previous day:  Intake/Output Summary (Last 24 hours) at 10/14/2022 0747 Last data filed at 10/14/2022 0110 Gross per 24 hour  Intake 360 ml  Output 650 ml  Net -290 ml     Intake/Output this shift: No intake/output data recorded.  Labs: No results for input(s): "HGB" in the last 72 hours. No results for input(s): "WBC", "RBC", "HCT", "PLT" in the last 72 hours. No results for input(s): "NA", "K", "CL", "CO2", "BUN", "CREATININE", "GLUCOSE", "CALCIUM" in the last 72 hours. No results for input(s): "LABPT", "INR" in the last 72 hours.  Exam: General - Patient is Alert and Oriented Extremity - Neurologically intact Sensation intact distally Intact pulses distally Dorsiflexion/Plantar flexion intact Dressing - dressing C/D/I Motor Function - intact, moving foot and toes well on exam.   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   The spinal cord appears normal. 2.   No spinal stenosis. 3.   Mild multilevel degenerative changes as detailed above that do not lead to spinal stenosis or nerve root compression. 4.   T2 hyperintense foci within the pons consistent with chronic microvascular ischemic changes.   Loop - Medtronic Linq 10/22/2020 10/22/2020   Nocturia    Paroxysmal atrial fibrillation (HCC)    Peripheral neuropathy    Peripheral vascular disease (HCC)    Sleep apnea    on 6 cm, nasal pillow   Spinal stenosis    Stroke (HCC) 05/01/2011   ischemic   Weakness generalized 10/21/2021   Severe, legs and arms    Assessment/Plan: 7 Days Post-Op Procedure(s) (LRB): IRRIGATION AND DEBRIDEMENT EXTREMITY WITH WOUND CLOSURE (Right) Principal Problem:   Wound dehiscence  Estimated body mass index is 26.62 kg/m as calculated from the following:   Height as of this encounter: 5\' 8"  (1.727 m).   Weight as of this encounter: 79.4 kg. 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 Aundria Rud who will see  the patient in the office in 2 weeks to discuss alternate surgical options vs non-op management   For now, will keep in knee immobilizer which has worked better for him than TROM.    He will go to SNF once bed placement obtained.   Brandon Billings, PA-C Orthopedic Surgery [phone] 10/14/2022, 7:47 AM

## 2022-10-14 NOTE — TOC Progression Note (Signed)
Transition of Care (TOC) - Progression Note   Patient Details  Name: Brandon Mendoza. MRN: [id number] Date of Birth: [birth year]  Transition of Care [facility]) CM/SW Contact  Ewing Schlein, LCSW Phone Number: 10/14/2022, 10:12 AM  Clinical Narrative: CSW followed up with [place] in admissions at [facility]. Per [place], a bed is expected to become available next week and she will follow up with TOC. CSW updated patient and wife.  Expected Discharge Plan: Skilled Nursing Facility Barriers to Discharge: SNF Pending bed offer  Expected Discharge Plan and Services In-house Referral: Clinical Social Work Post Acute Care Choice: Skilled Nursing Facility Living arrangements for the past 2 months: Single Family Home  Social Determinants of Health (SDOH) Interventions SDOH Screenings   Food Insecurity: No Food Insecurity (10/08/2022)  Housing: Low Risk  (10/08/2022)  Transportation Needs: No Transportation Needs (10/08/2022)  Utilities: Not At Risk (10/08/2022)  Depression (PHQ2-9): Low Risk  (12/10/2021)  Social Connections: Unknown (08/10/2021)   Received from [facility]  Tobacco Use: Medium Risk (10/07/2022)   Readmission Risk Interventions    07/09/2022    1:44 PM  Readmission Risk Prevention Plan  Transportation Screening Complete  PCP or Specialist Appt within 5-7 Days Complete  Home Care Screening Complete  Medication Review (RN CM) Complete

## 2022-10-14 NOTE — Plan of Care (Signed)
  Problem: Education: Goal: Knowledge of General Education information will improve Description: Including pain rating scale, medication(s)/side effects and non-pharmacologic comfort measures Outcome: Adequate for Discharge   Problem: Health Behavior/Discharge Planning: Goal: Ability to manage health-related needs will improve Outcome: Adequate for Discharge   Problem: Clinical Measurements: Goal: Ability to maintain clinical measurements within normal limits will improve Outcome: Progressing Goal: Will remain free from infection Outcome: Progressing Goal: Diagnostic test results will improve Outcome: Progressing Goal: Respiratory complications will improve Outcome: Adequate for Discharge Goal: Cardiovascular complication will be avoided Outcome: Progressing   Problem: Activity: Goal: Risk for activity intolerance will decrease Outcome: Progressing   Problem: Nutrition: Goal: Adequate nutrition will be maintained Outcome: Completed/Met   Problem: Coping: Goal: Level of anxiety will decrease Outcome: Progressing   Problem: Elimination: Goal: Will not experience complications related to bowel motility Outcome: Completed/Met Goal: Will not experience complications related to urinary retention Outcome: Completed/Met   Problem: Pain Managment: Goal: General experience of comfort will improve Outcome: Progressing   Problem: Safety: Goal: Ability to remain free from injury will improve Outcome: Progressing   Problem: Skin Integrity: Goal: Risk for impaired skin integrity will decrease Outcome: Progressing

## 2022-10-15 ENCOUNTER — Encounter: Payer: Self-pay | Admitting: Cardiology

## 2022-10-15 NOTE — Progress Notes (Signed)
Physical Therapy Treatment Patient Details Name: Brandon Mendoza. MRN: [id number] DOB: 19-Mar-1941 Today's Date: 10/15/2022   History of Present Illness 82 yo male admitted secondsary to R knee wound dehiscence and recurrent rupture of his quadriceps tendon; pt is s/p I &D R knee wound on 10/07/22. PMH: quad tendon repair x3, COPD, macular degeneration, CVA, neuropathy, spinal stenosis, lumbar fusion, afib    PT Comments  Pt progressing with therapy, incr gait distance tolerated with distance limited by PT to maintain TDWB. Continue to follow in acute setting. D/c plan remains appropriate.     Assistance Recommended at Discharge Intermittent Supervision/Assistance  If plan is discharge home, recommend the following:  Can travel by private vehicle    Assistance with cooking/housework;Help with stairs or ramp for entrance;A little help with walking and/or transfers;A little help with bathing/dressing/bathroom;Assist for transportation   No  Equipment Recommendations  None recommended by PT    Recommendations for Other Services       Precautions / Restrictions Precautions Precautions: Fall Precaution Comments: NO ROM R knee; reviewed knee precautions, avoiding quad activation. pt verbalizes understanding Required Braces or Orthoses: Knee Immobilizer - Right Knee Immobilizer - Right: On at all times Other Brace: R lower leg and foot edema improved Restrictions RLE Weight Bearing: Touchdown weight bearing Other Position/Activity Restrictions: "completely avoid quad activation, bending knee, using leg to push up out of bed/chair" Per Dr Nilsa Nutting progress note today pt is TDWB to [facility] (10/11/22)     Mobility  Bed Mobility Overal bed mobility: Needs Assistance Bed Mobility: Supine to Sit     Supine to sit: Min guard Sit to supine: Min guard, Min assist   General bed mobility comments: encouraged pt to allow PT to support RLE to avoid quad activation    Transfers Overall transfer  level: Needs assistance Equipment used: Rolling walker (2 wheels) Transfers: Sit to/from Stand, Bed to chair/wheelchair/BSC Sit to Stand: Min assist, Min guard, From elevated surface Stand pivot transfers: Min assist         General transfer comment: assist to steady and safely maneuver RW, cues to avoid any pushing with RLE    Ambulation/Gait Ambulation/Gait assistance: Min assist Gait Distance (Feet): 40 Feet Assistive device: Rolling walker (2 wheels) Gait Pattern/deviations: Step-to pattern Gait velocity: decreased     General Gait Details: cues for sequence, minimal Wt on RLE and incr use UEs to off load RLE during stance; PT limited distance d/t UE fatigue-for safety/maintain TDWB   Stairs             Wheelchair Mobility     Tilt Bed    Modified Rankin (Stroke Patients Only)       Balance   Sitting-balance support: Feet supported, No upper extremity supported Sitting balance-Leahy Scale: Good     Standing balance support: Bilateral upper extremity supported, Reliant on assistive device for balance Standing balance-Leahy Scale: Poor Standing balance comment: reliant on UEs and light external assist                            Cognition Arousal/Alertness: Awake/alert Behavior During Therapy: WFL for tasks assessed/performed Overall Cognitive Status: Within Functional Limits for tasks assessed                                 General Comments: Pleasant/motivated.  Lives home with Spouse but she Physically can not asisst  due to Hx back surgery        Exercises General Exercises - Upper Extremity Shoulder Flexion: AROM, Strengthening, Theraband, 5 reps, Both Theraband Level (Shoulder Flexion): Level 3 (Green) Shoulder Extension: AROM, Strengthening, Both, Theraband, 5 reps Theraband Level (Shoulder Extension): Level 3 (Green) Shoulder Horizontal ADduction: AROM, Strengthening, 5 reps, Theraband Theraband Level (Shoulder  Horizontal Adduction): Level 3 (Green) Elbow Flexion: AROM, Both, 5 reps, Strengthening, Theraband Theraband Level (Elbow Flexion): Level 3 (Green) Elbow Extension: AROM, Both, Strengthening, 5 reps, Theraband Theraband Level (Elbow Extension): Level 3 (Green) General Exercises - Lower Extremity Ankle Circles/Pumps: AROM, Both, 10 reps    General Comments        Pertinent Vitals/Pain Pain Assessment Pain Assessment: 0-10 Pain Score: 0-No pain    Home Living                          Prior Function            PT Goals (current goals can now be found in the care plan section) Acute Rehab PT Goals Patient Stated Goal: heal R LE PT Goal Formulation: With patient Time For Goal Achievement: 10/24/22 Potential to Achieve Goals: Good Progress towards PT goals: Progressing toward goals    Frequency    Min 1X/week      PT Plan Current plan remains appropriate    Co-evaluation              AM-PAC PT "6 Clicks" Mobility   Outcome Measure  Help needed turning from your back to your side while in a flat bed without using bedrails?: A Little Help needed moving from lying on your back to sitting on the side of a flat bed without using bedrails?: A Little Help needed moving to and from a bed to a chair (including a wheelchair)?: A Little Help needed standing up from a chair using your arms (e.g., wheelchair or bedside chair)?: A Little Help needed to walk in hospital room?: A Little Help needed climbing 3-5 steps with a railing? : Total 6 Click Score: 16    End of Session Equipment Utilized During Treatment: Gait belt Activity Tolerance: Patient tolerated treatment well Patient left: in bed;with call bell/phone within reach;with bed alarm set Nurse Communication: Mobility status PT Visit Diagnosis: Other abnormalities of gait and mobility (R26.89);Muscle weakness (generalized) (M62.81)     Time: 6962-9528 PT Time Calculation (min) (ACUTE ONLY): 20  min  Charges:    $Gait Training: 8-22 mins PT General Charges $$ ACUTE PT VISIT: 1 Visit                     Dearius Hoffmann, PT  Acute Rehab Dept ([facility]) [phone]  10/15/2022    [facility] 10/15/2022, 2:24 PM

## 2022-10-15 NOTE — Plan of Care (Signed)
Uses call button to request assistance as requested.  Declines need for pain med this shift.

## 2022-10-15 NOTE — Plan of Care (Signed)
  Problem: Education: Goal: Knowledge of General Education information will improve Description: Including pain rating scale, medication(s)/side effects and non-pharmacologic comfort measures Outcome: Progressing   Problem: Activity: Goal: Risk for activity intolerance will decrease Outcome: Progressing   Problem: Pain Managment: Goal: General experience of comfort will improve Outcome: Progressing   

## 2022-10-15 NOTE — Progress Notes (Signed)
Subjective: 8 Days Post-Op Procedure(s) (LRB): IRRIGATION AND DEBRIDEMENT EXTREMITY WITH WOUND CLOSURE (Right) Patient reports pain as mild.   Seen in AM rounds by Dr Shelle Iron  Objective: Vital signs in last 24 hours: Temp:  [97.6 F (36.4 C)-97.8 F (36.6 C)] 97.6 F (36.4 C) (07/20 0537) Pulse Rate:  [65-70] 70 (07/20 0537) Resp:  [15-17] 15 (07/20 0537) BP: (114-127)/(56-71) 117/56 (07/20 0537) SpO2:  [91 %-99 %] 91 % (07/20 0537) Weight:  [83.9 kg] 83.9 kg (07/20 0539)  Intake/Output from previous day: 07/19 0701 - 07/20 0700 In: 1200 [P.O.:1200] Out: 1900 [Urine:1900] Intake/Output this shift: No intake/output data recorded.  No results for input(s): "HGB" in the last 72 hours. No results for input(s): "WBC", "RBC", "HCT", "PLT" in the last 72 hours. No results for input(s): "NA", "K", "CL", "CO2", "BUN", "CREATININE", "GLUCOSE", "CALCIUM" in the last 72 hours. No results for input(s): "LABPT", "INR" in the last 72 hours.  Neurologically intact ABD soft Neurovascular intact Sensation intact distally Intact pulses distally Dorsiflexion/Plantar flexion intact Incision: scant drainage No cellulitis present Compartment soft No calf pain or sign of DVT   Assessment/Plan: 8 Days Post-Op Procedure(s) (LRB): IRRIGATION AND DEBRIDEMENT EXTREMITY WITH WOUND CLOSURE (Right) Advance diet Up with therapy Awaiting SNF  Brandon Mendoza 10/15/2022, 8:45 AM

## 2022-10-16 NOTE — Progress Notes (Signed)
Subjective: 9 Days Post-Op Procedure(s) (LRB): IRRIGATION AND DEBRIDEMENT EXTREMITY WITH WOUND CLOSURE (Right) Patient reports pain as mild.     Objective: Vital signs in last 24 hours: Temp:  [97.7 F (36.5 C)-98.1 F (36.7 C)] 97.7 F (36.5 C) (07/21 0600) Pulse Rate:  [65-87] 65 (07/21 0600) Resp:  [18] 18 (07/21 0600) BP: (124-141)/(63-68) 141/63 (07/21 0600) SpO2:  [96 %-99 %] 99 % (07/21 0600)  Intake/Output from previous day: 07/20 0701 - 07/21 0700 In: 360 [P.O.:360] Out: 650 [Urine:650] Intake/Output this shift: Total I/O In: 240 [P.O.:240] Out: 200 [Urine:200]  No results for input(s): "HGB" in the last 72 hours. No results for input(s): "WBC", "RBC", "HCT", "PLT" in the last 72 hours. No results for input(s): "NA", "K", "CL", "CO2", "BUN", "CREATININE", "GLUCOSE", "CALCIUM" in the last 72 hours. No results for input(s): "LABPT", "INR" in the last 72 hours.  Neurologically intact ABD soft Neurovascular intact Sensation intact distally Intact pulses distally Dorsiflexion/Plantar flexion intact Incision: scant drainage No cellulitis present Compartment soft No calf pain or sign of DVT   Assessment/Plan: 9 Days Post-Op Procedure(s) (LRB): IRRIGATION AND DEBRIDEMENT EXTREMITY WITH WOUND CLOSURE (Right) Advance diet Up with therapy Awaiting SNF  Brandon Mendoza 10/16/2022, 9:32 AM

## 2022-10-16 NOTE — Progress Notes (Signed)
Physical Therapy Treatment Patient Details Name: Brandon Mendoza. MRN: [id number] DOB: 01/31/1941 Today's Date: 10/16/2022   History of Present Illness 82 yo male admitted secondsary to R knee wound dehiscence and recurrent rupture of his quadriceps tendon; pt is s/p I &D R knee wound on 10/07/22. PMH: quad tendon repair x3, COPD, macular degeneration, CVA, neuropathy, spinal stenosis, lumbar fusion, afib    PT Comments  Pt reports feeling a little weaker and discouraged today; Brandon Mendoza is agreeable to PT however having incr difficulty with off loading RLE during gait and experiencing some knee buckling; had LOB x2 this session requiring mod assist to safely return to seated position and prevent fall;  D/c plan remains appropriate, discussed importance of continued work on balance and strengthening prior to decision about R knee surgery.    Assistance Recommended at Discharge Intermittent Supervision/Assistance  If plan is discharge home, recommend the following:  Can travel by private vehicle    Assistance with cooking/housework;Help with stairs or ramp for entrance;A little help with walking and/or transfers;A little help with bathing/dressing/bathroom;Assist for transportation   No  Equipment Recommendations  None recommended by PT    Recommendations for Other Services       Precautions / Restrictions Precautions Precautions: Fall Precaution Comments: NO ROM R knee; reviewed knee precautions, avoiding quad activation. pt verbalizes understanding Required Braces or Orthoses: Knee Immobilizer - Right Knee Immobilizer - Right: On at all times Restrictions RLE Weight Bearing: Touchdown weight bearing Other Position/Activity Restrictions: "completely avoid quad activation, bending knee, using leg to push up out of bed/chair" Per Dr Nilsa Nutting progress note today pt is TDWB to [facility] (10/11/22)     Mobility  Bed Mobility Overal bed mobility: Needs Assistance Bed Mobility: Supine to Sit      Supine to sit: Min guard Sit to supine: Min guard, Min assist   General bed mobility comments: encouraged pt to allow PT to support RLE to avoid quad activation    Transfers Overall transfer level: Needs assistance Equipment used: Rolling walker (2 wheels) Transfers: Sit to/from Stand, Bed to chair/wheelchair/BSC Sit to Stand: Min assist, Mod assist, From elevated surface   Step pivot transfers: Min assist, Mod assist       General transfer comment: assist to steady and safely maneuver RW, cues to avoid any pushing with RLE for STS, posterior LOB with initial stand PT assisted back to sitting. improved effort on second trial. cues for use of UEs to off load RLE with stand step pivtot    Ambulation/Gait Ambulation/Gait assistance: Mod assist Gait Distance (Feet): 25 Feet (5" and 20') Assistive device: Rolling walker (2 wheels) Gait Pattern/deviations: Step-to pattern Gait velocity: decreased     General Gait Details: cues for sequence, minimal Wt on RLE and incr use UEs to off load RLE during stance; pt amb 5' and had posterior LOB/unsteady gait and was lowered into chair for seated rest; after rest pt able to continue to amb 20' however having difficulty off-loading RLE with UEs therefore R knee with some mild buckling this session   Stairs             Wheelchair Mobility     Tilt Bed    Modified Rankin (Stroke Patients Only)       Balance   Sitting-balance support: Feet supported, No upper extremity supported Sitting balance-Leahy Scale: Good     Standing balance support: Bilateral upper extremity supported, Reliant on assistive device for balance Standing balance-Leahy Scale: Poor Standing balance comment:  reliant on UEs and external assist d/t posterior LOB                            Cognition Arousal/Alertness: Awake/alert Behavior During Therapy: WFL for tasks assessed/performed Overall Cognitive Status: Within Functional Limits for  tasks assessed                                          Exercises      General Comments        Pertinent Vitals/Pain Pain Assessment Pain Assessment: 0-10 Pain Score: 0-No pain    Home Living                          Prior Function            PT Goals (current goals can now be found in the care plan section) Acute Rehab PT Goals Patient Stated Goal: heal R LE PT Goal Formulation: With patient Time For Goal Achievement: 10/24/22 Potential to Achieve Goals: Good Progress towards PT goals: Progressing toward goals    Frequency    Min 1X/week      PT Plan Current plan remains appropriate    Co-evaluation              AM-PAC PT "6 Clicks" Mobility   Outcome Measure  Help needed turning from your back to your side while in a flat bed without using bedrails?: A Little Help needed moving from lying on your back to sitting on the side of a flat bed without using bedrails?: A Little Help needed moving to and from a bed to a chair (including a wheelchair)?: A Lot Help needed standing up from a chair using your arms (e.g., wheelchair or bedside chair)?: A Lot Help needed to walk in hospital room?: A Lot Help needed climbing 3-5 steps with a railing? : Total 6 Click Score: 13    End of Session Equipment Utilized During Treatment: Gait belt Activity Tolerance: Patient tolerated treatment well Patient left: in bed;with call bell/phone within reach;with bed alarm set Nurse Communication: Mobility status PT Visit Diagnosis: Other abnormalities of gait and mobility (R26.89);Muscle weakness (generalized) (M62.81)     Time: 4132-4401 PT Time Calculation (min) (ACUTE ONLY): 13 min  Charges:    $Gait Training: 8-22 mins PT General Charges $$ ACUTE PT VISIT: 1 Visit                     Willodean Leven, PT  Acute Rehab Dept [facility]) [phone]  10/16/2022    [facility] 10/16/2022, 1:40 PM

## 2022-10-17 DIAGNOSIS — M2040 Other hammer toe(s) (acquired), unspecified foot: Secondary | ICD-10-CM

## 2022-10-17 NOTE — Progress Notes (Signed)
Physical Therapy Treatment Patient Details Name: Brandon Mendoza. MRN: [id number] DOB: 27-Oct-1940 Today's Date: 10/17/2022   History of Present Illness 82 yo male admitted secondsary to R knee wound dehiscence and recurrent rupture of his quadriceps tendon; pt is s/p I &D R knee wound on 10/07/22. PMH: quad tendon repair x3, COPD, macular degeneration, CVA, neuropathy, spinal stenosis, lumbar fusion, afib    PT Comments  Pt continues to work consistently with PT, reviewed importance of avoiding quad activation during gait and reasoning this + off loading RLE during stance.  Pt able to able to tolerate incr activity compared to yesterday's session; d/c plan remains appropriate    Assistance Recommended at Discharge Intermittent Supervision/Assistance  If plan is discharge home, recommend the following:  Can travel by private vehicle    Assistance with cooking/housework;Help with stairs or ramp for entrance;A little help with walking and/or transfers;A little help with bathing/dressing/bathroom;Assist for transportation   No  Equipment Recommendations  None recommended by PT    Recommendations for Other Services       Precautions / Restrictions Precautions Precautions: Fall Precaution Comments: NO ROM R knee; reviewed knee precautions, avoiding quad activation. pt verbalizes understanding Required Braces or Orthoses: Knee Immobilizer - Right Knee Immobilizer - Right: On at all times Restrictions RLE Weight Bearing: Touchdown weight bearing Other Position/Activity Restrictions: "completely avoid quad activation, bending knee, using leg to push up out of bed/chair" Per Dr Nilsa Nutting progress note today pt is TDWB to [facility] (10/11/22)     Mobility  Bed Mobility Overal bed mobility: Needs Assistance Bed Mobility: Supine to Sit, Sit to Supine     Supine to sit: Min guard Sit to supine: Min guard, Min assist   General bed mobility comments: encouraged pt to allow PT to support RLE to  avoid quad activation    Transfers Overall transfer level: Needs assistance Equipment used: Rolling walker (2 wheels) Transfers: Sit to/from Stand, Bed to chair/wheelchair/BSC Sit to Stand: Min assist   Step pivot transfers: Min assist, Mod assist       General transfer comment: assist to steady and safely maneuver RW, cues to avoid any pushing with RLE for STS, cues for use of UEs to off load RLE with stand step pivot    Ambulation/Gait Ambulation/Gait assistance: Mod assist, Min assist Gait Distance (Feet): 30 Feet (20' more) Assistive device: Rolling walker (2 wheels) Gait Pattern/deviations: Step-to pattern Gait velocity: decreased     General Gait Details: cues for sequence, minimal Wt on RLE and incr use UEs to off load RLE during stance;  seated rest with pt sitting very quickly; after rest pt able to amb 20' however having difficulty off-loading RLE with UE fatigue therefore R knee with some mild buckling this session; educated pt on reasoning for use of UEs and avoiding terminal R knee extension/quad activation   Stairs             Wheelchair Mobility     Tilt Bed    Modified Rankin (Stroke Patients Only)       Balance   Sitting-balance support: Feet supported, No upper extremity supported Sitting balance-Leahy Scale: Good     Standing balance support: Bilateral upper extremity supported, Reliant on assistive device for balance Standing balance-Leahy Scale: Poor Standing balance comment: reliant on UEs and external assist d/t posterior LOB  Cognition Arousal/Alertness: Awake/alert Behavior During Therapy: WFL for tasks assessed/performed Overall Cognitive Status: Within Functional Limits for tasks assessed                                          Exercises      General Comments        Pertinent Vitals/Pain Pain Assessment Pain Assessment: No/denies pain    Home Living                           Prior Function            PT Goals (current goals can now be found in the care plan section) Acute Rehab PT Goals Patient Stated Goal: heal R LE PT Goal Formulation: With patient Time For Goal Achievement: 10/24/22 Potential to Achieve Goals: Good Progress towards PT goals: Progressing toward goals    Frequency    Min 1X/week      PT Plan Current plan remains appropriate    Co-evaluation              AM-PAC PT "6 Clicks" Mobility   Outcome Measure  Help needed turning from your back to your side while in a flat bed without using bedrails?: A Little Help needed moving from lying on your back to sitting on the side of a flat bed without using bedrails?: A Little Help needed moving to and from a bed to a chair (including a wheelchair)?: A Lot Help needed standing up from a chair using your arms (e.g., wheelchair or bedside chair)?: A Lot Help needed to walk in hospital room?: A Lot Help needed climbing 3-5 steps with a railing? : Total 6 Click Score: 13    End of Session Equipment Utilized During Treatment: Gait belt Activity Tolerance: Patient tolerated treatment well Patient left: in bed;with call bell/phone within reach;with bed alarm set Nurse Communication: Mobility status PT Visit Diagnosis: Other abnormalities of gait and mobility (R26.89);Muscle weakness (generalized) (M62.81)     Time: 4098-1191 PT Time Calculation (min) (ACUTE ONLY): 22 min  Charges:    $Gait Training: 8-22 mins PT General Charges $$ ACUTE PT VISIT: 1 Visit                     Rivky Clendenning, PT  Acute Rehab Dept ([facility]) [phone]  10/17/2022    [facility] 10/17/2022, 3:13 PM

## 2022-10-17 NOTE — Consult Note (Signed)
PODIATRY CONSULTATION  NAME Brandon Mendoza. MRN [id number] DOB 09-18-1940 DOA 10/07/2022   Reason for consult:  Chief Complaint  Patient presents with   Fall   Leg Injury    Consulting physician: Rosalene Billings PA-C  History of present illness: 82 y.o. male admitted secondary to complications from quadriceps tendon repair.  History of right foot surgery performed by me on 06/02/2022 with routine healing: RT great toe arthrodesis with hammertoe repair of the second digit.  Recently after his revisional surgery he noticed increased edema with ecchymosis to the foot.  Was concerned and mentioned it to the hospitalist.  Podiatry was consulted. Patient also states that he subsequently developed a ulcer to the posterior aspect of the right heel secondary to a cast that was applied recently.  The nurses have been applying Medihoney and a light dressing.  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   The spinal cord appears normal. 2.   No spinal stenosis. 3.   Mild multilevel degenerative changes as detailed above that do not lead to spinal stenosis or nerve root compression. 4.   T2 hyperintense foci within the pons consistent with chronic microvascular ischemic changes.   Loop - Medtronic Linq 10/22/2020 10/22/2020   Nocturia     Paroxysmal atrial fibrillation (HCC)    Peripheral neuropathy    Peripheral vascular disease (HCC)    Sleep apnea    on 6 cm, nasal pillow   Spinal stenosis    Stroke (HCC) 05/01/2011   ischemic   Weakness generalized 10/21/2021   Severe, legs and arms       Latest Ref Rng & Units 10/07/2022    7:28 AM 09/01/2022    6:30 AM 08/31/2022    3:28 AM  CBC  WBC 4.0 - 10.5 K/uL 11.5  9.6  8.6   Hemoglobin 13.0 - 17.0 g/dL 40.9  81.1  91.4   Hematocrit 39.0 - 52.0 % 43.1  38.8  37.1   Platelets 150 - 400 K/uL 271  257  228        Latest Ref Rng & Units 10/07/2022    7:28 AM 08/31/2022    3:28 AM 07/13/2022    4:40 AM  BMP  Glucose 70 - 99 mg/dL 782  956  213   BUN 8 - 23 mg/dL 22  23  28    Creatinine 0.61 - 1.24 mg/dL 0.86  5.78  4.69   Sodium 135 - 145 mmol/L 133  136  137   Potassium 3.5 - 5.1 mmol/L 3.9  3.9  3.8   Chloride 98 - 111 mmol/L 103  104  109   CO2 22 - 32 mmol/L 22  23  21   Calcium 8.9 - 10.3 mg/dL 8.4  8.6  7.5     RT heel 10/17/2022 AM   Physical Exam: General: The patient is alert and oriented x3 in no acute distress.   Pressure ulcer noted to the posterior aspect of the heel right foot.  No significant edema noted to the foot.  There is some ecchymosis/bruising around the medial aspect of the forefoot but nothing concerning.  No pain on palpation or range of motion of the foot.  Good range of motion without crepitus to the first MTP.   ASSESSMENT/PLAN OF CARE PSxHx RT great toe arthrodesis w/ 2nd digit hammertoe repair.  DOS: 06/02/2022 Ulcer right posterior heel Revisional quadriceps tendon repair  -From a podiatry standpoint the foot is stable.  I do not believe the ecchymosis to the foot is associated with complications from his foot surgery.  No crepitus or pain with palpation.  ROM WNL.  There is really no edema also to the foot. -Continue Medihoney and dressings as ordered to the right posterior heel. -Recommend offloading of the heel with 2 pillows under  the calf -Podiatry will sign off     Thank you for the consult.  Please contact me directly via secure chat with any questions or concerns.     Felecia Shelling, DPM [facility]  Dr. Felecia Shelling, DPM    [address] [place], [place] [address]                Office [phone]  Fax [phone]

## 2022-10-17 NOTE — Progress Notes (Signed)
Subjective: 10 Days Post-Op Procedure(s) (LRB): IRRIGATION AND DEBRIDEMENT EXTREMITY WITH WOUND CLOSURE (Right) Patient reports pain as mild.   Patient seen in rounds for Dr. Charlann Boxer.  Patient is resting in bed on exam this morning. He showed me bruising on his foot, and mentioned that he called his foot surgeon at [facility] about this.  We will start therapy today.   Objective: Vital signs in last 24 hours: Temp:  [98.2 F (36.8 C)-98.6 F (37 C)] 98.2 F (36.8 C) (07/21 2245) Pulse Rate:  [66-92] 70 (07/22 0636) Resp:  [16-17] 17 (07/22 0636) BP: (107-137)/(51-61) 137/61 (07/22 0636) SpO2:  [94 %-98 %] 94 % (07/22 0636)  Intake/Output from previous day:  Intake/Output Summary (Last 24 hours) at 10/17/2022 0739 Last data filed at 10/17/2022 0735 Gross per 24 hour  Intake 1200 ml  Output 1600 ml  Net -400 ml     Intake/Output this shift: Total I/O In: 240 [P.O.:240] Out: 0   Labs: No results for input(s): "HGB" in the last 72 hours. No results for input(s): "WBC", "RBC", "HCT", "PLT" in the last 72 hours. No results for input(s): "NA", "K", "CL", "CO2", "BUN", "CREATININE", "GLUCOSE", "CALCIUM" in the last 72 hours. No results for input(s): "LABPT", "INR" in the last 72 hours.  Exam: General - Patient is Alert and Oriented Extremity - Neurologically intact Sensation intact distally Intact pulses distally Dorsiflexion/Plantar flexion intact Dressing - dressing C/D/I Motor Function - intact, moving foot and toes well on exam.   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   The spinal cord appears normal. 2.   No spinal stenosis. 3.   Mild multilevel degenerative changes as detailed above that do not lead to spinal stenosis or nerve root compression. 4.   T2 hyperintense foci within the pons consistent with chronic microvascular ischemic changes.   Loop - Medtronic Linq 10/22/2020 10/22/2020   Nocturia    Paroxysmal atrial fibrillation (HCC)    Peripheral neuropathy    Peripheral vascular disease (HCC)    Sleep apnea    on 6 cm, nasal pillow   Spinal stenosis    Stroke (HCC) 05/01/2011   ischemic   Weakness generalized 10/21/2021   Severe, legs and arms    Assessment/Plan: 10 Days Post-Op Procedure(s) (LRB): IRRIGATION AND DEBRIDEMENT EXTREMITY WITH WOUND CLOSURE (Right) Principal Problem:   Wound dehiscence  Estimated body mass index is 28.12 kg/m as calculated from the following:   Height as of this encounter: 5\' 8"  (1.727 m).   Weight as of this encounter: 83.9 kg. 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 We discussed that the bruising is completely related to his quad tendon rupture. He has good ROM in the foot and toes, and no pain in this area. He does have some swelling, also related to his injury and immobility. I have reached out to them to determine if he needs any inpatient consult vs outpatient follow up in the next few weeks.     We have  discussed with Dr. Aundria Rud who will see the patient in the office in 2 weeks to discuss alternate surgical options vs non-op management   For now, will keep in knee immobilizer which has worked better for him than TROM.    He will go to SNF once bed placement obtained.   Rosalene Billings, PA-C Orthopedic Surgery [phone] 10/17/2022, 7:39 AM

## 2022-10-17 NOTE — Plan of Care (Signed)
  Problem: Activity: Goal: Risk for activity intolerance will decrease Outcome: Adequate for Discharge   Problem: Skin Integrity: Goal: Risk for impaired skin integrity will decrease Outcome: Adequate for Discharge   

## 2022-10-17 NOTE — TOC Progression Note (Signed)
Transition of Care (TOC) - Progression Note    Patient Details  Name: Brandon Mendoza. MRN: [id number] Date of Birth: [birth year]  Transition of Care [facility]) CM/SW Contact  Amada Jupiter, LCSW Phone Number: 10/17/2022, 4:23 PM  Clinical Narrative:     Pt has received and accepted a SNF bed offer with [facility] who can admit patient tomorrow.  Have alerted MD/RN/PT.    Expected Discharge Plan: Skilled Nursing Facility Barriers to Discharge: SNF Pending bed offer  Expected Discharge Plan and Services In-house Referral: Clinical Social Work   Post Acute Care Choice: Skilled Nursing Facility Living arrangements for the past 2 months: Single Family Home                                       Social Determinants of Health (SDOH) Interventions SDOH Screenings   Food Insecurity: No Food Insecurity (10/08/2022)  Housing: Low Risk  (10/08/2022)  Transportation Needs: No Transportation Needs (10/08/2022)  Utilities: Not At Risk (10/08/2022)  Depression (PHQ2-9): Low Risk  (12/10/2021)  Social Connections: Unknown (08/10/2021)   Received from [facility]  Tobacco Use: Medium Risk (10/07/2022)    Readmission Risk Interventions    07/09/2022    1:44 PM  Readmission Risk Prevention Plan  Transportation Screening Complete  PCP or Specialist Appt within 5-7 Days Complete  Home Care Screening Complete  Medication Review (RN CM) Complete

## 2022-10-17 NOTE — Plan of Care (Signed)
  Problem: Pain Managment: Goal: General experience of comfort will improve Outcome: Progressing   Problem: Coping: Goal: Level of anxiety will decrease Outcome: Progressing   

## 2022-10-18 MED ORDER — MEDIHONEY WOUND/BURN DRESSING EX PSTE: 1.0000 | PASTE | Freq: Every day | CUTANEOUS | 0 refills | Status: AC

## 2022-10-18 NOTE — Progress Notes (Signed)
Patient discharged to [facility] via [facility]. All belongings w/ patient and family. Report called to [place].

## 2022-10-18 NOTE — Discharge Instructions (Signed)
INSTRUCTIONS AFTER SURGERY  Heel Wound Instructions: Please perform a daily cleanse with NS followed by an application of leptospermum [place] honey (MediHoney). This is to be covered with a dry gauze dressing and secured with a silicone foam. Heels are to be floated.     Remove items at home which could result in a fall. This includes throw rugs or furniture in walking pathways ICE to the affected joint every three hours while awake for 30 minutes at a time, for at least the first 3-5 days, and then as needed for pain and swelling.  Continue to use ice for pain and swelling. You may notice swelling that will progress down to the foot and ankle.  This is normal after surgery.  Elevate your leg when you are not up walking on it.   Continue to use the breathing machine you got in the hospital (incentive spirometer) which will help keep your temperature down.  It is common for your temperature to cycle up and down following surgery, especially at night when you are not up moving around and exerting yourself.  The breathing machine keeps your lungs expanded and your temperature down.   DIET:  As you were doing prior to hospitalization, we recommend a well-balanced diet.  DRESSING / WOUND CARE / SHOWERING  Keep the surgical dressing until follow up.  The dressing is water proof, so you can shower without any extra covering.  IF THE DRESSING FALLS OFF or the wound gets wet inside, change the dressing with sterile gauze.  Please use good hand washing techniques before changing the dressing.  Do not use any lotions or creams on the incision until instructed by your surgeon.    ACTIVITY  Increase activity slowly as tolerated, but follow the weight bearing instructions below.   No driving for 6 weeks or until further direction given by your physician.  You cannot drive while taking narcotics.  No lifting or carrying greater than 10 lbs. until further directed by your surgeon. Avoid periods of inactivity  such as sitting longer than an hour when not asleep. This helps prevent blood clots.  You may return to work once you are authorized by your doctor.     WEIGHT BEARING   Partial weight bearing with assist device as directed.     CONSTIPATION  Constipation is defined medically as fewer than three stools per week and severe constipation as less than one stool per week.  Even if you have a regular bowel pattern at home, your normal regimen is likely to be disrupted due to multiple reasons following surgery.  Combination of anesthesia, postoperative narcotics, change in appetite and fluid intake all can affect your bowels.   YOU MUST use at least one of the following options; they are listed in order of increasing strength to get the job done.  They are all available over the counter, and you may need to use some, POSSIBLY even all of these options:    Drink plenty of fluids (prune juice may be helpful) and high fiber foods Colace 100 mg by mouth twice a day  Senokot for constipation as directed and as needed Dulcolax (bisacodyl), take with full glass of water  Miralax (polyethylene glycol) once or twice a day as needed.  If you have tried all these things and are unable to have a bowel movement in the first 3-4 days after surgery call either your surgeon or your primary doctor.    If you experience loose stools or diarrhea,  hold the medications until you stool forms back up.  If your symptoms do not get better within 1 week or if they get worse, check with your doctor.  If you experience "the worst abdominal pain ever" or develop nausea or vomiting, please contact the office immediately for further recommendations for treatment.   ITCHING:  If you experience itching with your medications, try taking only a single pain pill, or even half a pain pill at a time.  You can also use Benadryl over the counter for itching or also to help with sleep.   TED HOSE STOCKINGS:  Use stockings on both legs  until for at least 2 weeks or as directed by physician office. They may be removed at night for sleeping.  MEDICATIONS:  See your medication summary on the "After Visit Summary" that nursing will review with you.  You may have some home medications which will be placed on hold until you complete the course of blood thinner medication.  It is important for you to complete the blood thinner medication as prescribed.  PRECAUTIONS:  If you experience chest pain or shortness of breath - call 911 immediately for transfer to the hospital emergency department.   If you develop a fever greater that 101 F, purulent drainage from wound, increased redness or drainage from wound, foul odor from the wound/dressing, or calf pain - CONTACT YOUR SURGEON.                                                   FOLLOW-UP APPOINTMENTS:  If you do not already have a post-op appointment, please call the office for an appointment to be seen by your surgeon.  Guidelines for how soon to be seen are listed in your "After Visit Summary", but are typically between 1-4 weeks after surgery.  POST-OPERATIVE OPIOID TAPER INSTRUCTIONS: It is important to wean off of your opioid medication as soon as possible. If you do not need pain medication after your surgery it is ok to stop day one. Opioids include: Codeine, Hydrocodone(Norco, Vicodin), Oxycodone(Percocet, oxycontin) and hydromorphone amongst others.  Long term and even short term use of opiods can cause: Increased pain response Dependence Constipation Depression Respiratory depression And more.  Withdrawal symptoms can include Flu like symptoms Nausea, vomiting And more Techniques to manage these symptoms Hydrate well Eat regular healthy meals Stay active Use relaxation techniques(deep breathing, meditating, yoga) Do Not substitute Alcohol to help with tapering If you have been on opioids for less than two weeks and do not have pain than it is ok to stop all together.   Plan to wean off of opioids This plan should start within one week post op of your joint replacement. Maintain the same interval or time between taking each dose and first decrease the dose.  Cut the total daily intake of opioids by one tablet each day Next start to increase the time between doses. The last dose that should be eliminated is the evening dose.   MAKE SURE YOU:  Understand these instructions.  Get help right away if you are not doing well or get worse.    Thank you for letting us be a part of your medical care team.  It is a privilege we respect greatly.  We hope these instructions will help you stay on track for a fast and full  recovery!

## 2022-10-18 NOTE — Discharge Summary (Signed)
Physician Discharge Summary   Patient ID: Brandon Mendoza. MRN: [id number] DOB/AGE: May 25, 1940 82 y.o.  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   The spinal cord appears normal. 2.   No spinal stenosis. 3.   Mild multilevel degenerative changes as detailed above that do not lead to spinal stenosis or nerve root compression. 4.   T2 hyperintense foci within the pons consistent with chronic microvascular ischemic changes.   Loop - [employer] Linq 10/22/2020 10/22/2020   Nocturia    Paroxysmal atrial fibrillation (HCC)    Peripheral neuropathy    Peripheral vascular disease (HCC)    Sleep apnea    on 6 cm, nasal pillow   Spinal stenosis    Stroke (HCC) 05/01/2011   ischemic   Weakness generalized 10/21/2021   Severe, legs and arms   Discharge Diagnoses:   Principal Problem:   Wound dehiscence  Estimated body mass index is 28.12 kg/m as calculated from the following:   Height as of this encounter: 5\' 8"  (1.727 m).   Weight as of this encounter: 83.9 kg.  Procedure:  Procedure(s) (LRB): IRRIGATION AND DEBRIDEMENT EXTREMITY WITH WOUND CLOSURE (Right)   Consults:  podiatry  HPI:  Brandon Mendoza. is a 82 y.o. male who had a quad tendon rupture that was surgically repaired approximately 5 weeks ago.  Patient got out of bed today and fell and noted a complete dehiscence of his surgical wound.  Prior to this the wound was healed and there is no signs of infection.  Patient presented to the emergency room for further evaluation and treatment.  As result of the surgical wound dehiscence orthopedic consultation was requested.  After evaluation I elected to take the patient back to the operating room for formal irrigation and debridement of the wound followed by primary wound closure.  Patient is followed by Dr. Charlann Boxer and he is aware.    Laboratory Data: Admission on 10/07/2022  Component Date Value Ref Range Status   Sodium 10/07/2022 133 (L)  135 - 145 mmol/L Final   Potassium 10/07/2022 3.9  3.5 - 5.1 mmol/L Final   Chloride 10/07/2022 103  98 - 111 mmol/L Final   CO2 10/07/2022 22  22 - 32 mmol/L Final   Glucose, Bld 10/07/2022 110 (H)  70 - 99 mg/dL Final   Glucose reference range applies only to samples taken after fasting for at least 8 hours.   BUN 10/07/2022 22  8 - 23 mg/dL Final   Creatinine, Ser 10/07/2022 1.02  0.61 - 1.24 mg/dL Final   Calcium 01/22/2535  8.4 (L)  8.9 - 10.3 mg/dL Final   GFR, Estimated 10/07/2022 >60  >60 mL/min Final   Comment: (NOTE) Calculated using the CKD-EPI Creatinine Equation (2021)    Anion gap 10/07/2022 8  5 - 15 Final   Performed at [facility], [address], [place], [place] [address]   WBC 10/07/2022 11.5 (H)  4.0 - 10.5 K/uL Final   RBC 10/07/2022 4.86  4.22 - 5.81 MIL/uL Final   Hemoglobin 10/07/2022 14.6  13.0 - 17.0 g/dL Final   HCT 60/45/4098 43.1  39.0 - 52.0 % Final   MCV 10/07/2022 88.7  80.0 - 100.0 fL Final   MCH 10/07/2022 30.0  26.0 - 34.0 pg Final   MCHC 10/07/2022 33.9  30.0 - 36.0 g/dL Final   RDW 11/91/4782 13.0  11.5 - 15.5 % Final   Platelets 10/07/2022 271  150 - 400 K/uL Final   nRBC  10/07/2022 0.0  0.0 - 0.2 % Final   Neutrophils Relative % 10/07/2022 76  % Final   Neutro Abs 10/07/2022 8.8 (H)  1.7 - 7.7 K/uL Final   Lymphocytes Relative 10/07/2022 7  % Final   Lymphs Abs 10/07/2022 0.8  0.7 - 4.0 K/uL Final   Monocytes Relative 10/07/2022 11  % Final   Monocytes Absolute 10/07/2022 1.3 (H)  0.1 - 1.0 K/uL Final   Eosinophils Relative 10/07/2022 4  % Final   Eosinophils Absolute 10/07/2022 0.5  0.0 - 0.5 K/uL Final   Basophils Relative 10/07/2022 1  % Final   Basophils Absolute 10/07/2022 0.1  0.0 - 0.1 K/uL Final   Immature Granulocytes 10/07/2022 1  % Final   Abs Immature Granulocytes 10/07/2022 0.06  0.00 - 0.07 K/uL Final   Performed at [facility], [address] [place], [place] [address]   Specimen Description 10/07/2022    Final                   Value:WOUND RIGHT KNEE DEHISCENCE Performed at [facility], [address], [place], [place] [address]    Special Requests 10/07/2022    Final                   Value:NONE Performed at [facility], [address], [place], [place] [address]    Gram Stain 10/07/2022    Final                   Value:RARE WBC PRESENT, PREDOMINANTLY PMN NO ORGANISMS SEEN    Culture 10/07/2022    Final                   Value:No growth aerobically or anaerobically. Performed at [facility], [address], [place], [place] [address]    Report Status 10/07/2022 10/12/2022 FINAL   Final  Admission on 08/30/2022, Discharged on 09/05/2022  Component Date Value Ref Range Status   WBC 08/31/2022 8.6  4.0 - 10.5 K/uL Final   RBC 08/31/2022 4.07 (L)  4.22 - 5.81 MIL/uL Final   Hemoglobin 08/31/2022 12.5 (L)  13.0 - 17.0 g/dL Final   HCT 84/13/2440 37.1 (L)  39.0 - 52.0 % Final   MCV 08/31/2022 91.2  80.0 - 100.0 fL Final   MCH 08/31/2022 30.7  26.0 - 34.0 pg Final   MCHC 08/31/2022 33.7  30.0 - 36.0 g/dL Final   RDW 01/22/2535 12.4  11.5 - 15.5 % Final   Platelets  08/31/2022 228  150 - 400 K/uL Final   nRBC 08/31/2022 0.0  0.0 - 0.2 % Final   Performed at [facility], [address], [place], [place] [address]   Sodium 08/31/2022 136  135 - 145 mmol/L Final   Potassium 08/31/2022 3.9  3.5 - 5.1 mmol/L Final   Chloride 08/31/2022 104  98 - 111 mmol/L Final   CO2 08/31/2022 23  22 - 32 mmol/L Final   Glucose, Bld 08/31/2022 159 (H)  70 - 99 mg/dL Final   Glucose reference range applies only to samples taken after fasting for at least 8 hours.   BUN 08/31/2022 23  8 - 23 mg/dL Final   Creatinine, Ser 08/31/2022 1.01  0.61 - 1.24 mg/dL Final   Calcium 60/45/4098 8.6 (L)  8.9 - 10.3 mg/dL Final   GFR, Estimated 08/31/2022 >60  >60 mL/min Final   Comment: (NOTE) Calculated using the CKD-EPI Creatinine Equation (2021)    Anion gap 08/31/2022 9  5 - 15 Final   Performed at [facility], [address] [place], [place] [address]   WBC 09/01/2022 9.6  4.0 - 10.5 K/uL Final   RBC 09/01/2022 4.19 (L)  4.22 - 5.81 MIL/uL Final   Hemoglobin 09/01/2022 12.9 (L)  13.0 - 17.0 g/dL Final   HCT 78/29/5621 38.8 (L)  39.0 - 52.0 % Final   MCV 09/01/2022 92.6  80.0 - 100.0 fL Final   MCH 09/01/2022 30.8  26.0 - 34.0 pg Final   MCHC 09/01/2022 33.2  30.0 - 36.0 g/dL Final   RDW 30/86/5784 13.0  11.5 - 15.5 % Final   Platelets 09/01/2022 257  150 - 400 K/uL Final   nRBC 09/01/2022 0.0  0.0 - 0.2 % Final   Performed at [facility], [address] [place], [place] [address]     X-Rays:DG Knee Right Port  Result Date: 10/07/2022 CLINICAL DATA:  82 year old male status post fall. Two previous right quadriceps surgeries, most recently 08/30/2022. EXAM: PORTABLE RIGHT KNEE - 1-2 VIEW COMPARISON:  Right knee series 07/05/2022. FINDINGS: Portable cross-table lateral view at 0714 hours. Indistinct soft tissue gas superior to the patella, may be resolving postoperative change in this setting. Small joint effusion is  possible, but regressed compared to 07/05/2022. Patella positioning is within normal limits. Portable AP view, some chronic medial compartment joint space loss. No acute osseous abnormality identified. Calcified peripheral vascular disease. IMPRESSION: 1. Indistinct soft tissue gas and thickening superior to the patella, might be resolving postoperative change in this setting. Correlate for any signs/symptoms of infection. 2. No acute fracture or dislocation identified about the right knee. Electronically Signed   By: Odessa Fleming M.D.   On: 10/07/2022 07:24    EKG: Orders placed or performed in visit on 04/06/22   EKG 12-Lead     Hospital Course: Teagen Mcleary. is a 82 y.o. who was admitted to [facility]. They were brought to the operating room on 10/07/2022 and underwent Procedure(s): IRRIGATION AND DEBRIDEMENT EXTREMITY WITH WOUND CLOSURE.  Patient tolerated the procedure well and was later transferred to the recovery room and then to the orthopaedic floor for postoperative care. They were given PO and IV analgesics for pain control following their surgery. They were given 24 hours of postoperative antibiotics of  Anti-infectives (From admission, onward)    Start     Dose/Rate Route Frequency Ordered Stop   10/08/22 0000  ceFAZolin (ANCEF) IVPB 1 g/50  mL premix        1 g 100 mL/hr over 30 Minutes Intravenous Every 6 hours 10/07/22 2020 10/08/22 1250   10/07/22 1615  ceFAZolin (ANCEF) IVPB 2g/100 mL premix        2 g 200 mL/hr over 30 Minutes Intravenous On call to O.R. 10/07/22 1600 10/07/22 1817   10/07/22 1601  ceFAZolin (ANCEF) 2-4 GM/100ML-% IVPB       Note to Pharmacy: Kathe Becton W: cabinet override      10/07/22 1601 10/07/22 1802   10/07/22 0700  ceFAZolin (ANCEF) IVPB 1 g/50 mL premix        1 g 100 mL/hr over 30 Minutes Intravenous  Once 10/07/22 0645 10/07/22 0815      and started on DVT prophylaxis in the form of Aspirin.   PT and OT were ordered for mobility  training. Discharge planning consulted to help with postop disposition and equipment needs. Patient had a fair night on the evening of surgery. They started to get up OOB with therapy on POD #1.  Continued to work with therapy during the duration of his stay, focusing on mobilizing with appropriate precautions in knee immobilizer. It was determined he would need SNF placement, which took about a week to obtain. He continued working with PT while here.    He was discharged to SNF at [facility] later that day in stable condition.  He will follow up with Dr. Aundria Rud next week.  Diet: Regular diet Activity: PWB Follow-up: in 1 weeks Disposition: Skilled nursing facility Discharged Condition: good    Allergies as of 10/18/2022   No Known Allergies      Medication List     STOP taking these medications    aspirin EC 81 MG tablet Replaced by: aspirin 81 MG chewable tablet   oxyCODONE 5 MG immediate release tablet Commonly known as: Oxy IR/ROXICODONE       TAKE these medications    acetaminophen 325 MG tablet Commonly known as: TYLENOL Take 650 mg by mouth as needed for moderate pain.   aspirin 81 MG chewable tablet Chew 1 tablet (81 mg total) by mouth 2 (two) times daily. Replaces: aspirin EC 81 MG tablet   DULoxetine 30 MG capsule Commonly known as: CYMBALTA Take 1 capsule (30 mg total) by mouth at bedtime.   flecainide 100 MG tablet Commonly known as: TAMBOCOR Take 1 tablet (100 mg total) by mouth 2 (two) times daily. What changed: how much to take   HYDROcodone-acetaminophen 5-325 MG tablet Commonly known as: NORCO/VICODIN Take 1 tablet by mouth every 8 (eight) hours as needed for severe pain.   leptospermum manuka honey Pste paste Apply 1 Application topically daily.   losartan-hydrochlorothiazide 50-12.5 MG tablet Commonly known as: HYZAAR Take 1 tablet by mouth in the morning.   methocarbamol 500 MG tablet Commonly known as: ROBAXIN Take 1 tablet (500 mg  total) by mouth every 6 (six) hours as needed for muscle spasms.   multivitamin with minerals Tabs tablet Take 1 tablet by mouth in the morning.   ondansetron 4 MG tablet Commonly known as: ZOFRAN Take 1 tablet (4 mg total) by mouth every 6 (six) hours as needed for nausea.   polyethylene glycol 17 g packet Commonly known as: MIRALAX / GLYCOLAX Take 17 g by mouth 2 (two) times daily. What changed: when to take this   rosuvastatin 10 MG tablet Commonly known as: CRESTOR Take 10 mg by mouth every evening.   sertraline 50 MG tablet  Commonly known as: ZOLOFT Take 1 tablet (50 mg total) by mouth daily. Use to taper off sertraline slowly over several months, if tolerated. Be aware anxiety and irritability may increase and you might have to stay on this What changed: how much to take         Signed: Dennie Bible, PA-C Orthopedic Surgery 10/18/2022, 7:28 AM

## 2022-10-18 NOTE — Care Management Important Message (Signed)
Important Message  Patient Details IM Letter given. Name: Brandon Mendoza. MRN: [id number] Date of Birth: [birth year]   Medicare Important Message Given:  Yes     [name] 10/18/2022, 10:01 AM

## 2022-10-18 NOTE — TOC Transition Note (Signed)
Transition of Care [facility]) - CM/SW Discharge Note  Patient Details  Name: Brandon Mendoza. MRN: [id number] Date of Birth: Mar 09, 1941  Transition of Care [facility]) CM/SW Contact:  Ewing Schlein, LCSW Phone Number: 10/18/2022, 10:34 AM  Clinical Narrative: [facility] can accept the patient today. Patient will go to room 607 and the number for report is [phone]. Discharge summary, discharge orders, and SNF transfer report faxed to facility in hub. Medical necessity form done; PTAR scheduled. Patient and spouse notified of discharge and transportation being set up. Discharge packet completed. RN updated TOC signing off.    Final next level of care: Skilled Nursing Facility Barriers to Discharge: Barriers Resolved  Patient Goals and CMS Choice CMS Medicare.gov Compare Post Acute Care list provided to:: Patient Choice offered to / list presented to : Patient  Discharge Placement Existing PASRR number confirmed : 10/10/22          Patient chooses bed at: [facility] Patient to be transferred to facility by: PTAR Name of family member notified: Roel Douthat (spouse) Patient and family notified of of transfer: 10/18/22  Discharge Plan and Services Additional resources added to the After Visit Summary for   In-house Referral: Clinical Social Work Post Acute Care Choice: Skilled Nursing Facility           Social Determinants of Health (SDOH) Interventions SDOH Screenings   Food Insecurity: No Food Insecurity (10/08/2022)  Housing: Low Risk  (10/08/2022)  Transportation Needs: No Transportation Needs (10/08/2022)  Utilities: Not At Risk (10/08/2022)  Depression (PHQ2-9): Low Risk  (12/10/2021)  Social Connections: Unknown (08/10/2021)   Received from [facility]  Tobacco Use: Medium Risk (10/07/2022)   Readmission Risk Interventions    07/09/2022    1:44 PM  Readmission Risk Prevention Plan  Transportation Screening Complete  PCP or Specialist Appt within 5-7 Days Complete  Home Care  Screening Complete  Medication Review (RN CM) Complete

## 2022-10-19 ENCOUNTER — Telehealth: Payer: Self-pay | Admitting: Podiatry

## 2022-10-19 DIAGNOSIS — F410 Panic disorder [episodic paroxysmal anxiety] without agoraphobia: Secondary | ICD-10-CM | POA: Diagnosis not present

## 2022-10-19 DIAGNOSIS — Z7689 Persons encountering health services in other specified circumstances: Secondary | ICD-10-CM | POA: Diagnosis not present

## 2022-10-19 NOTE — Telephone Encounter (Signed)
This is a Dr. Logan Bores patient who was discharged from the hospital this week. Can you please schedule a follow-up? Thanks!

## 2022-10-20 ENCOUNTER — Telehealth: Payer: Self-pay | Admitting: Internal Medicine

## 2022-10-20 DIAGNOSIS — I739 Peripheral vascular disease, unspecified: Secondary | ICD-10-CM

## 2022-10-20 DIAGNOSIS — S81801D Unspecified open wound, right lower leg, subsequent encounter: Secondary | ICD-10-CM

## 2022-10-20 DIAGNOSIS — J449 Chronic obstructive pulmonary disease, unspecified: Secondary | ICD-10-CM

## 2022-10-20 DIAGNOSIS — I480 Paroxysmal atrial fibrillation: Secondary | ICD-10-CM

## 2022-10-20 NOTE — Telephone Encounter (Signed)
Patient dropped off document Home Health Certificate (Order ID [id number]), to be filled out by provider. Patient requested to send it back via Fax within 5-days. Document is located in providers tray at front office.Please advise

## 2022-10-24 ENCOUNTER — Encounter: Payer: Self-pay | Admitting: Internal Medicine

## 2022-10-24 DIAGNOSIS — F410 Panic disorder [episodic paroxysmal anxiety] without agoraphobia: Secondary | ICD-10-CM

## 2022-10-25 ENCOUNTER — Other Ambulatory Visit: Payer: Self-pay | Admitting: *Deleted

## 2022-10-25 DIAGNOSIS — Z9889 Other specified postprocedural states: Secondary | ICD-10-CM

## 2022-10-25 NOTE — Telephone Encounter (Signed)
Form was faxed back previously.

## 2022-10-26 ENCOUNTER — Telehealth (INDEPENDENT_AMBULATORY_CARE_PROVIDER_SITE_OTHER): Payer: Medicare Other | Admitting: Internal Medicine

## 2022-10-26 ENCOUNTER — Encounter: Payer: Self-pay | Admitting: Internal Medicine

## 2022-10-26 DIAGNOSIS — R42 Dizziness and giddiness: Secondary | ICD-10-CM | POA: Diagnosis not present

## 2022-10-26 DIAGNOSIS — R4189 Other symptoms and signs involving cognitive functions and awareness: Secondary | ICD-10-CM

## 2022-10-26 DIAGNOSIS — R531 Weakness: Secondary | ICD-10-CM | POA: Diagnosis not present

## 2022-10-26 DIAGNOSIS — R41840 Attention and concentration deficit: Secondary | ICD-10-CM | POA: Diagnosis not present

## 2022-10-26 DIAGNOSIS — Z993 Dependence on wheelchair: Secondary | ICD-10-CM

## 2022-10-26 DIAGNOSIS — F331 Major depressive disorder, recurrent, moderate: Secondary | ICD-10-CM

## 2022-10-26 MED ORDER — SERTRALINE HCL 100 MG PO TABS: 100.0000 mg | ORAL_TABLET | Freq: Every day | ORAL | 3 refills | Status: DC

## 2022-10-26 MED ORDER — ARIPIPRAZOLE 2 MG PO TABS: 2.0000 mg | ORAL_TABLET | Freq: Every day | ORAL | 3 refills | Status: DC

## 2022-10-26 NOTE — Patient Instructions (Addendum)
It was a pleasure seeing you today! Your health and satisfaction are our top priorities.  Glenetta Hew, MD  Your Providers PCP: Lula Olszewski, MD,  [phone]) Referring Provider: Lula Olszewski, MD,  [phone]) Care Team Provider: Melvyn Novas, MD,  [phone]) Care Team Provider: Alejandro Mulling, RN Care Team Provider: Alejandro Mulling, RN Care Team Provider: Windle Guard, MD,  [phone]) Care Team Provider: Cordella Register,  [phone]) Care Team Provider: Durene Romans, MD,  [phone])  VISIT SUMMARY:  During our visit, we discussed your concerns about worsening depression, persistent dizziness and nausea, and increasing weakness in your legs. We have made some adjustments to your treatment plan to address these issues.  YOUR PLAN:  -DEPRESSION: Your depression symptoms have worsened, which is affecting your ability to focus and your overall mood. We will increase your Sertraline dosage back to 100mg  daily and add a new medication called Abilify to help improve your mood.  -NAUSEA AND DIZZINESS: You've been experiencing nausea and dizziness, which might be side effects of your current medications. We will continue with your current medications and keep a close watch on these symptoms.  -MOBILITY ISSUES: You've been having difficulty standing and walking due to weakness in your legs. We discussed the possibility of using forearm crutches for better support.  -GENERAL HEALTH MAINTENANCE: To assist with your overall health, a home health aid has been arranged. You should continue taking your current medications, including Flecainide, Osartan, Rosuvastatin, and Aspirin.  INSTRUCTIONS:  We have scheduled a follow-up appointment in 1 week to assess your response to the increased Sertraline dosage and the new Abilify medication. Please continue taking your current medications as prescribed and monitor your symptoms of nausea and dizziness. If  these symptoms persist or worsen, please contact the office immediately.   NEXT STEPS: [x]  Early Intervention: Schedule sooner appointment, call our on-call services, or go to emergency room if there is any significant Increase in pain or discomfort New or worsening symptoms Sudden or severe changes in your health [x]  Flexible Follow-Up: We recommend a No follow-ups on file. for optimal routine care. This allows for progress monitoring and treatment adjustments. [x]  Preventive Care: Schedule your annual preventive care visit! It's typically covered by insurance and helps identify potential health issues early. [x]  Lab & X-ray Appointments: Incomplete tests scheduled today, or call to schedule. X-rays: [facility] (M-F, 8:30am-noon or 1pm-5pm). [x]  Medical Information Release: Sign a release form at front desk to obtain relevant medical information we don't have.  MAKING THE MOST OF OUR FOCUSED 20 MINUTE APPOINTMENTS: [x]   Clearly state your top concerns at the beginning of the visit to focus our discussion [x]   If you anticipate you will need more time, please inform the front desk during scheduling - we can book multiple appointments in the same week. [x]   If you have transportation problems- use our convenient video appointments or ask about transportation support. [x]   We can get down to business faster if you use MyChart to update information before the visit and submit non-urgent questions before your visit. Thank you for taking the time to provide details through MyChart.  Let our nurse know and she can import this information into your encounter documents.  Arrival and Wait Times: [x]   Arriving on time ensures that everyone receives prompt attention. [x]   Early morning (8a) and afternoon (1p) appointments tend to have shortest wait times. [x]   Unfortunately, we cannot delay appointments for late arrivals or  hold slots during phone calls.  Getting Answers and Following  Up [x]   Simple Questions & Concerns: For quick questions or basic follow-up after your visit, reach [place] at [phone] or MyChart messaging. [x]   Complex Concerns: If your concern is more complex, scheduling an appointment might be best. Discuss this with the staff to find the most suitable option. [x]   Lab & Imaging Results: We'll contact you directly if results are abnormal or you don't use MyChart. Most normal results will be on MyChart within 2-3 business days, with a review message from Dr. Jon Billings. Haven't heard back in 2 weeks? Need results sooner? Contact us at [phone]. [x]   Referrals: Our referral coordinator will manage specialist referrals. The specialist's office should contact you within 2 weeks to schedule an appointment. Call us if you haven't heard from them after 2 weeks.  Staying Connected [x]   MyChart: Activate your MyChart for the fastest way to access results and message [place]. See the last page of this paperwork for instructions on how to activate.  Bring to Your Next Appointment [x]   Medications: Please bring all your medication bottles to your next appointment to ensure we have an accurate record of your prescriptions. [x]   Health Diaries: If you're monitoring any health conditions at home, keeping a diary of your readings can be very helpful for discussions at your next appointment.  Billing [x]   X-ray & Lab Orders: These are billed by separate companies. Contact the invoicing company directly for questions or concerns. [x]   Visit Charges: Discuss any billing inquiries with our administrative services team.  Your Satisfaction Matters [x]   Share Your Experience: We strive for your satisfaction! If you have any complaints, or preferably compliments, please let Dr. Jon Billings know directly or contact our Practice Administrators, Edwena Felty or [employer], by asking at the front desk.   Reviewing Your Records [x]   Review this early draft of your clinical encounter  notes below and the final encounter summary tomorrow on MyChart after its been completed.  All orders placed so far are visible here: Moderate episode of recurrent major depressive disorder (HCC) -     Sertraline HCl; Take 1 tablet (100 mg total) by mouth daily.  Dispense: 30 tablet; Refill: 3 -     ARIPiprazole; Take 1 tablet (2 mg total) by mouth daily.  Dispense: 90 tablet; Refill: 3  Dizziness  Weakness  Concentration deficit  Wheelchair dependent  Sluggishness

## 2022-10-26 NOTE — Assessment & Plan Note (Deleted)
Major Depressive Disorder Severe depression has led to impaired concentration and motivation in him. He is currently on Sertraline, reduced from 100mg  to 50mg  daily, and Duloxetine 30mg  in the evening. We will increase Sertraline back to 100mg  daily and add Abilify, with the dose to be determined. A follow-up is scheduled in 1 week to assess his response and adjust the Abilify dose as necessary. Patient had other issues as below but asked not to be addressed today

## 2022-10-26 NOTE — Progress Notes (Signed)
[facility]: [phone]   Virtual Medical Office Visit - Video Telemedicine   Patient:  Brandon Mendoza. (1940-05-21)  MRN:   [id number]      Date:   10/26/2022  Patient Care Team: Lula Olszewski, MD as PCP - General (Internal Medicine) Dohmeier, Porfirio Mylar, MD as Consulting Physician (Neurology) Alejandro Mulling, RN as [facility] Care Management Alejandro Mulling, RN as [facility] Care Management Windle Guard, MD as Consulting Physician (Anesthesiology) Cordella Register as Physician Assistant (Orthopedic Surgery) Durene Romans, MD as Consulting Physician (Orthopedic Surgery) Today's Healthcare Provider: Lula Olszewski, MD   Assessment & Plan      The primary encounter diagnosis was Moderate episode of recurrent major depressive disorder (HCC). Diagnoses of Dizziness, Weakness, Concentration deficit, Wheelchair dependent, and Sluggishness were also pertinent to this visit.  However, he ultimately asked to hold off on all interventions except the antidepressants.  Major Depressive Disorder Severe depression has led to impaired concentration and motivation in him. He is currently on Sertraline, reduced from 100mg  to 50mg  daily, and Duloxetine 30mg  in the evening. We will increase Sertraline back to 100mg  daily and add Abilify, with the dose to be determined. A follow-up is scheduled in 1 week to assess his response and adjust the Abilify dose as necessary.  Nausea and Dizziness He is experiencing nausea and dizziness, possibly as side effects of his current medications. He previously received Ativan in rehab for similar symptoms, but it is not considered a long-term solution. We will continue his current medications and monitor the symptoms.  Mobility Issues He has weakness in his legs, which complicates standing and walking, and he is currently using a walker. We discussed transitioning to forearm crutches for better  support.  General Health Maintenance A consult for home health aid through home health or social work has already been arranged to assist with his general health maintenance. He will continue his current medications, including Flecainide 100mg  twice daily, Osartan, Rosuvastatin, and Aspirin.      Patient had other issues as below but asked not to be addressed today Treatment plan discussed and reviewed in detail. Explained medication safety and potential side effects.  Answered all patient questions and confirmed understanding and comfort with the plan. Encouraged patient to contact our office if they have any questions or concerns.  Agreed on patient coming for a sooner office visit if symptoms worsen, persist, or new symptoms develop. Discussed precautions in case of needing to visit the Emergency Department.    Subjective:   Chief Complaint / Reason for Visit:  Depression, Nausea, Weakness, concentration deficit, wheelchair bound, and dizziness   82 y.o. 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   Discussed the use of AI scribe software for clinical note transcription with the patient, who gave verbal consent to proceed.  History of Present Illness   The patient, with a history of depression, reports a worsening of depressive symptoms, including difficulty focusing and a general feeling of malaise. He expresses a desire for a  "heavy duty feel good" medication, indicating dissatisfaction with his  current regimen. He has been taking 50mg  of Sertraline, reduced from an initial 100mg , and a green and white pill in the evening, identified as Duloxetine 30mg .  In addition to his mental health concerns, the patient complains of persistent dizziness and nausea. He has been taking Flecainide twice a day, a vitamin pill, blood pressure medication, and a statin in the evening. He also reports taking Aspirin as recommended.  The patient's physical health appears to be deteriorating, with increasing weakness in his legs to the point of needing a walker for mobility. He reports his legs often give out when attempting to stand. Despite these challenges, he has been adhering to his medication regimen as prescribed.        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); History of left-sided carotid endarterectomy; Essential hypertension; OSA on CPAP; Normocytic anemia; Leukocytosis; AF (paroxysmal atrial fibrillation) (HCC); Chronic anticoagulation; Hyponatremia; Hematoma; Spinal stenosis of lumbar region; S/P lumbar fusion; Ataxia involving legs; Paroxysmal atrial fibrillation (HCC); Loop - Medtronic Linq 10/22/2020; Pain in joint of right knee; Acquired bilateral hammer toes; Acquired complex renal cyst; Acquired thrombophilia (HCC); Benign hypertension with 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); Constipation; 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HCC); Disequilibrium; Fall with injury; Falls; Functional gait abnormality; Gastroesophageal reflux disease without esophagitis; Idiopathic neuropathy; Ingrowing left great toenail; Other intervertebral disc degeneration, lumbar region; Pain in left foot; Pain in left  knee; Pain in right foot; Personal history of colonic polyps; Recurrent falls; Renal cyst; Tinea pedis of both feet; Urge incontinence; Urinary urgency; High arches; Hammertoes of both feet; Neuropathy; Generalized anxiety disorder; Primary osteoarthritis involving multiple joints; Claudication of lower extremity (HCC); Atrophy of calf muscles; Weakness generalized; Focal neurological deficit; Kyphosis; Memory impairment of gradual onset; Lightheadedness; Ataxia; Gait abnormality; Spinal stenosis at L4-L5 level; Claw foot; Acquired clawfoot, left foot; Acquired clawfoot, right foot; Pain in joint of right hip; Lumbar spondylosis; Myofascial pain; Macular degeneration; Unable to care for self; Dehydration; Depression; COPD (chronic obstructive pulmonary disease) (HCC); Quadriceps tendon rupture, right, initial encounter; Quadriceps tendon rupture, right, subsequent encounter; Wound dehiscence; and Sialoadenitis of submandibular gland on their problem list. ARIPiprazole, DULoxetine, HYDROcodone-acetaminophen, acetaminophen, aspirin, flecainide, leptospermum manuka honey, losartan-hydrochlorothiazide, methocarbamol, multivitamin with minerals, ondansetron, polyethylene glycol, rosuvastatin, and sertraline        Objective:  Physical Exam         Depressed appearing, limited exam due to video not stable.  General Appearance:  Well Developed, Well Nourished, No Acute Distress by Limited Video Assessment Pulmonary:  No Respiratory Distress Apparent. Normal Work of Breathing.   Neurological:  Awake, Alert. No Obvious Focal Neurological Deficits or Cognitive Impairments.  Sensorium Seems Unclouded. Psychiatric:  Appropriate Mood, Pleasant Demeanor, Calm, Articulate, Good Mood  Results Reviewed:          ------------------------------------------------------ Attestation:  Today's Healthcare Provider Lula Olszewski, MD was located at office at [facility] [address], [place] [address]. The patient was located at home. Today's Telemedicine visit was conducted via Video for 78m 12s after consent for telemedicine was obtained:  Video connection was lost when more than 50% of the duration of the visit was complete, at which time the remainder of the visit was completed via audio only.   All video encounter participant identities and locations confirmed visually and  verbally.  Signed: Lula Olszewski, MD 10/26/2022 8:20 PM

## 2022-10-27 ENCOUNTER — Telehealth: Payer: Self-pay | Admitting: Internal Medicine

## 2022-10-27 ENCOUNTER — Other Ambulatory Visit: Payer: Self-pay | Admitting: Cardiology

## 2022-10-27 NOTE — Transitions of Care (Post Inpatient/ED Visit) (Unsigned)
   10/27/2022  Name: Brandon Mendoza. MRN: [id number] DOB: 06/04/1940  Today's TOC FU Call Status: Today's TOC FU Call Status:: Unsuccessful Call (1st Attempt) Unsuccessful Call (1st Attempt) Date: 10/27/22  Attempted to reach the patient regarding the most recent Inpatient/ED visit.  Follow Up Plan: Additional outreach attempts will be made to reach the patient to complete the Transitions of Care (Post Inpatient/ED visit) call.   Signature   Woodfin Ganja LPN [facility] Nurse Health Advisor Direct Dial [phone]

## 2022-10-27 NOTE — Telephone Encounter (Signed)
Patient requests to be called with status of previous message

## 2022-10-27 NOTE — Telephone Encounter (Signed)
Pt states the RX  ARIPiprazole (ABILIFY) 2 MG tablet  Is very expensive and would like to know if we can sent a cheaper RX that  he can afford. Please advise.

## 2022-10-28 ENCOUNTER — Other Ambulatory Visit: Payer: Self-pay | Admitting: Internal Medicine

## 2022-10-28 DIAGNOSIS — F331 Major depressive disorder, recurrent, moderate: Secondary | ICD-10-CM

## 2022-10-28 MED ORDER — ARIPIPRAZOLE 2 MG PO TABS
2.0000 mg | ORAL_TABLET | Freq: Every day | ORAL | 3 refills | Status: AC
Start: 2022-10-28 — End: ?

## 2022-10-28 NOTE — Telephone Encounter (Signed)
They would prefer the generic, which is much cheaper. Please advise.

## 2022-10-28 NOTE — Telephone Encounter (Signed)
Rx sent to the pharmacy by Dr. Jon Billings.

## 2022-10-28 NOTE — Telephone Encounter (Signed)
Please send RX to [facility]

## 2022-10-28 NOTE — Telephone Encounter (Signed)
Patient and wife notified.  

## 2022-10-31 ENCOUNTER — Telehealth: Payer: Self-pay | Admitting: Internal Medicine

## 2022-10-31 NOTE — Telephone Encounter (Signed)
Patient states he picked up medication, Abilify, on 8/2 and took it that evening. States next day began having suicidal ideation so he stopped use for Sunday and this morning. States he is no longer having SI, but would like to trial zoloft dosage increase instead.

## 2022-10-31 NOTE — Telephone Encounter (Signed)
.  Home Health Verbal Orders  Agency:  [facility]  Caller: Gay Filler [phone]  Requesting OT/ PT/ Skilled nursing/ Social Work/ Speech: PT  Reason for Request:  Bed mobility, transfers, and wheelchair mobility  Frequency:  2 x 3 weeks  1 x 5 weeks   HH needs F2F w/in last 30 days

## 2022-11-01 NOTE — Telephone Encounter (Signed)
Patient called for an update

## 2022-11-02 NOTE — Telephone Encounter (Signed)
Patient's daughter called concerning patient's mental health. Please advise.

## 2022-11-03 ENCOUNTER — Other Ambulatory Visit: Payer: Self-pay | Admitting: Cardiology

## 2022-11-03 ENCOUNTER — Other Ambulatory Visit: Payer: Self-pay

## 2022-11-03 DIAGNOSIS — S76119A Strain of unspecified quadriceps muscle, fascia and tendon, initial encounter: Secondary | ICD-10-CM | POA: Insufficient documentation

## 2022-11-03 DIAGNOSIS — F331 Major depressive disorder, recurrent, moderate: Secondary | ICD-10-CM

## 2022-11-03 DIAGNOSIS — E7800 Pure hypercholesterolemia, unspecified: Secondary | ICD-10-CM

## 2022-11-03 MED ORDER — QUETIAPINE FUMARATE 25 MG PO TABS
25.0000 mg | ORAL_TABLET | Freq: Every day | ORAL | 0 refills | Status: DC
Start: 2022-11-03 — End: 2022-11-16

## 2022-11-04 NOTE — Telephone Encounter (Signed)
See note

## 2022-11-04 NOTE — Telephone Encounter (Signed)
OK to give VO? 

## 2022-11-04 NOTE — Telephone Encounter (Signed)
Called Gay Filler at [phone] LVM with VO

## 2022-11-08 NOTE — Telephone Encounter (Signed)
This has already been taken care of, seroquel was sent in to replace the abilify.

## 2022-11-14 ENCOUNTER — Telehealth: Payer: Self-pay | Admitting: Internal Medicine

## 2022-11-14 NOTE — Telephone Encounter (Signed)
Called and asked patient more information about form and he stated it should be coming from [facility] at [address] [place], [place] [address]; Requests a call from Tiffany to go over the med list we have/will be sending to [facility].   I also noticed that patient's PCP has been changed from Dr. Jon Billings to Dr. Nelson Chimes. I asked pt about this but he stated he doesn't know who that is. Can you look into this?

## 2022-11-14 NOTE — Telephone Encounter (Signed)
Patient states he is planning to move into a facility by the end of this week but they informed him they need an FL2 form filled out. I informed pt that facility would need to fax over the form for completion. Pt verbalized understanding, but also informed me that he doesn't want seroquel involved in his medication list since no longer taking.

## 2022-11-14 NOTE — Telephone Encounter (Signed)
Spoke with the office of Dr. Nelson Chimes and was informed that he is the doctor that would take care of the patient if Dr. Jon Billings is not able to. He is available at [facility] [place] [facility] for him (in case of a fall, etc).  This is why patient doesn't know who he is (he has not met him yet). Also, that they already have a medication list for him. I left my direct phone number with them to have someone to call me back tomorrow concerning what medications are on his list, so that I can call the patient back and let him know. Also gave them our fax number to fax the [facility] forms to our office.

## 2022-11-15 ENCOUNTER — Ambulatory Visit: Payer: Medicare Other

## 2022-11-15 ENCOUNTER — Ambulatory Visit (HOSPITAL_COMMUNITY): Payer: Medicare Other

## 2022-11-15 NOTE — Telephone Encounter (Signed)
Patient called for an update. I informed patient that paperwork has been received and given to Tiffany who will present it to PCP for signing. Pt verbalized understanding.

## 2022-11-16 ENCOUNTER — Other Ambulatory Visit: Payer: Self-pay

## 2022-11-16 DIAGNOSIS — F331 Major depressive disorder, recurrent, moderate: Secondary | ICD-10-CM

## 2022-11-17 ENCOUNTER — Encounter: Payer: Self-pay | Admitting: Internal Medicine

## 2022-11-17 ENCOUNTER — Telehealth (INDEPENDENT_AMBULATORY_CARE_PROVIDER_SITE_OTHER): Payer: Medicare Other | Admitting: Internal Medicine

## 2022-11-17 ENCOUNTER — Telehealth: Payer: Self-pay | Admitting: Internal Medicine

## 2022-11-17 DIAGNOSIS — Z789 Other specified health status: Secondary | ICD-10-CM

## 2022-11-17 DIAGNOSIS — S76111D Strain of right quadriceps muscle, fascia and tendon, subsequent encounter: Secondary | ICD-10-CM | POA: Diagnosis not present

## 2022-11-17 DIAGNOSIS — L89309 Pressure ulcer of unspecified buttock, unspecified stage: Secondary | ICD-10-CM | POA: Diagnosis not present

## 2022-11-17 DIAGNOSIS — Z741 Need for assistance with personal care: Secondary | ICD-10-CM

## 2022-11-17 DIAGNOSIS — Z7409 Other reduced mobility: Secondary | ICD-10-CM | POA: Diagnosis not present

## 2022-11-17 NOTE — Telephone Encounter (Signed)
Brandon Mendoza with [name] at [facility].states the wrong fax# was provided earlier. Requests FL2 Forms with Section 10 and 11 be changed to Assisted Living and Faxed to correct fax# [phone] asap.

## 2022-11-17 NOTE — Telephone Encounter (Deleted)
Lovena Le called.

## 2022-11-17 NOTE — Telephone Encounter (Signed)
FL2 paperwork was initially faxed by [employer] @ 1:49 but came back as failure. Shortly after [facility] called and informed us they got a new fax machine. They provided [place] with a new fax number and I immediately re-faxed paperwork which came back as successful @ 2:02 pm.   Moments later, [facility] [place] called back stating they need to have a couple of things corrected on the paperwork. The final change was re-faxed @ 3:43 pm by me and came back as successful.

## 2022-11-17 NOTE — Patient Instructions (Signed)
VISIT SUMMARY:  Dear Brandon Mendoza, during our recent consultation, we discussed your current health status and the challenges you are facing due to the rupture of your quadriceps tendon in your right leg. This has significantly affected your mobility and ability to perform daily activities independently. We also discussed your current treatment plan, including the upcoming use of locking braces, increased physical therapy, and continued care at the skilled nursing facility.  YOUR PLAN:  -RUPTURED QUADRICEPS TENDON: Your quadriceps tendon in your right leg has ruptured twice, which has greatly affected your ability to walk. We are waiting for two locking braces for your legs, which we hope will improve your mobility.  -INABILITY TO PERFORM ACTIVITIES OF DAILY LIVING: Due to your mobility issues, you currently need help with all daily activities. You will continue to receive skilled nursing care and assistance with these activities at the facility.  -PRESSURE ULCER: You have a small pressure ulcer on your buttocks, which is being treated with cleaning and application of polysporin and Desitin. We plan to continue this treatment and may add Mepilex dressings for extra padding and protection.  -PHYSICAL THERAPY: You are currently undergoing physical therapy once a week. We plan to increase this to five-seven times a week to help improve your strength and mobility, especially after you receive the braces.  -MEDICATION MANAGEMENT: We reviewed your current medication regimen. We will continue with your current medications and ensure the list is accurately communicated to the skilled nursing facility.  INSTRUCTIONS:  You have recently moved into a skilled nursing facility for rehabilitation and a higher level of care. We will maintain the current plan of care in this setting. Physician visits will be as needed, with a specific check-in planned after the braces are received and initially used.

## 2022-11-17 NOTE — Progress Notes (Signed)
Brandon Mendoza: [phone]   Virtual Medical Office Visit - Video Telemedicine   Patient:  Brandon Mendoza. (09/14/1940) located at skilled nursing facility "[name] MRN:   [id number]      Date:   11/17/2022  PCP:    Eloisa Northern, MD   Today's Healthcare Provider: Lula Olszewski, MD located at office: [facility] at [facility] [address] [place] [address] Today's Telemedicine visit was conducted via Video for 40m 20s after consent for telemedicine was obtained:  Video connection was lost when more than 50% of the duration of the visit was complete, at which time the remainder of the visit was completed via audio only.   All video encounter participant identities and locations confirmed visually and verbally.   Assessment and Plan:     Assessment & Plan Quadriceps tendon rupture, right, subsequent encounter Ruptured Quadriceps Tendon He has experienced two ruptures of the quadriceps tendon, severely impacting his ability to walk and necessitating two-person assistance for transfers. He is awaiting two locking braces for his legs, expected to enhance mobility. We will continue the current plan of waiting for the braces and reassess his mobility and care plan upon receipt. Requires assistance with activities of daily living (ADL) Inability to Perform Activities of Daily Living Due to mobility issues, he requires assistance with all activities of daily living. The plan is to continue skilled nursing care and assistance with these activities. His medication regimen was reviewed We will continue the current medication regimen and ensure the medication list is accurately communicated to the skilled nursing facility. Having recently moved into a skilled nursing facility for rehabilitation and a higher level of care, we will maintain the current plan of care in this setting. Physician visits will be as needed, with a specific check-in planned after the  braces are received and initially used.  Impaired mobility and ADLs He is currently undergoing physical therapy once a week, showing limited progress. We will increase physical therapy sessions to five times a week to boost strength and mobility, especially after receiving the braces. Pressure injury of skin of buttock, unspecified injury stage, unspecified laterality A small pressure ulcer on his buttocks is being treated with cleaning, polysporin, and Desitin application. We will maintain this treatment and consider adding Mepilex dressings for extra padding and protection.  Coordinated care plan with patient and family and nursing facility, completed fl2 formwork, had it faxed urgently to the facility to ensure he gets appropriate care level.  Follow-up Recommended follow up: No follow-ups on file. Future Appointments  Date Time Provider Department Center  03/07/2023  9:00 AM Lula Olszewski, MD [facility]           Clinical Presentation:    82 y.o. 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); History of left-sided carotid endarterectomy; Essential hypertension; OSA on CPAP; Normocytic anemia; Leukocytosis; AF (paroxysmal atrial fibrillation) (HCC); Chronic anticoagulation; Hyponatremia; Hematoma; Spinal stenosis of lumbar region; S/P lumbar fusion; Ataxia involving legs; Paroxysmal atrial fibrillation (HCC); Loop - Medtronic Linq 10/22/2020; Pain in joint of right knee; Acquired bilateral hammer toes; Acquired complex renal cyst; Acquired thrombophilia (HCC); Benign hypertension with 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); Constipation; 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HCC); Disequilibrium; Fall with injury; Falls; Functional gait abnormality; Gastroesophageal reflux  disease without esophagitis; Idiopathic  neuropathy; Ingrowing left great toenail; Other intervertebral disc degeneration, lumbar region; Pain in left foot; Pain in left knee; Pain in right foot; Personal history of colonic polyps; Recurrent falls; Renal cyst; Tinea pedis of both feet; Urge incontinence; Urinary urgency; High arches; Hammertoes of both feet; Neuropathy; Generalized anxiety disorder; Primary osteoarthritis involving multiple joints; Claudication of lower extremity (HCC); Atrophy of calf muscles; Weakness generalized; Focal neurological deficit; Kyphosis; Memory impairment of gradual onset; Lightheadedness; Ataxia; Gait abnormality; Spinal stenosis at L4-L5 level; Claw foot; Acquired clawfoot, left foot; Acquired clawfoot, right foot; Pain in joint of right hip; Lumbar spondylosis; Myofascial pain; Macular degeneration; Unable to care for self; Dehydration; Depression; COPD (chronic obstructive pulmonary disease) (HCC); Quadriceps tendon rupture, right, initial encounter; Quadriceps tendon rupture, right, subsequent encounter; Wound dehiscence; and Sialoadenitis of submandibular gland on their problem list. His reasons/main concerns/chief complaints for today's office visit are Form Completion (Need FL2 completed)   ------------------------------------------------------------------------------------------------------------------------ AI-Extracted: Discussed the use of AI scribe software for clinical note transcription with the patient, who gave verbal consent to proceed.  History of Present Illness   The patient, [name], has recently been admitted to a skilled nursing facility due to a significant decrease in mobility following a quadriceps tendon rupture in the right leg. The tendon ruptured twice and despite surgical intervention, the patient has lost all function in the right leg. The patient's mobility is further compromised by weakness in the left leg, necessitating a two-person assist  for transfers. The patient's family has been providing care at home, but the demands have become too great, leading to the decision for admission to the skilled nursing facility.  The hope is that these braces will significantly improve mobility, allowing the patient to perform activities of daily living independently.  The patient has no history of seizure activity, no loss of bowel or bladder control, and no difficulties with feeding or swallowing. The patient does have a small pressure ulcer on the buttocks, which is currently being managed at home with cleaning and application of polysporin and Desitin. The patient's breathing is reported as normal, with the use of a CPAP machine nightly.  The patient's current medication regimen was not discussed in detail during the consultation. The patient's weight is approximately 180 lbs and height is 5'8". The patient's blood pressure is checked weekly and reported as stable. The patient does not have diabetes.  The patient and family are hopeful that the braces, combined with physical therapy, will improve the patient's mobility and independence.       ------------------------------------------------------------------------------------------------------------------------ He has a past medical history of Acquired clawfoot, right foot (12/16/2021), Anxiety, Atrophy of calf muscles (10/21/2021), Carotid artery occlusion, CKD (chronic kidney disease), COPD (chronic obstructive pulmonary disease) (HCC), Depression, Dysrhythmia, Focal neurological deficit (10/21/2021), colonic polyps, Hyperlipidemia, Hypertension, Kyphosis (10/21/2021), Loop - Medtronic Linq 10/22/2020 (10/22/2020), Nocturia, Paroxysmal atrial fibrillation (HCC), Peripheral neuropathy, Peripheral vascular disease (HCC), Sleep apnea, Spinal stenosis, Stroke (HCC) (05/01/2011), and Weakness generalized (10/21/2021).  Problem list overviews that were updated at today's visit: No problems  updated. Current Outpatient Medications on File Prior to Visit  Medication Sig   acetaminophen (TYLENOL) 325 MG tablet Take 650 mg by mouth as needed for moderate pain.   DULoxetine (CYMBALTA) 30 MG capsule Take 1 capsule (30 mg total) by mouth at bedtime.   flecainide (TAMBOCOR) 100 MG tablet Take 1 tablet (100 mg total) by mouth 2 (two) times daily. (Patient taking differently: Take 50 mg by mouth 2 (two) times daily.)   HYDROcodone-acetaminophen (  NORCO/VICODIN) 5-325 MG tablet Take 1 tablet by mouth every 8 (eight) hours as needed for severe pain.   leptospermum manuka honey (MEDIHONEY) PSTE paste Apply 1 Application topically daily.   losartan-hydrochlorothiazide (HYZAAR) 50-12.5 MG tablet TAKE 1 TABLET EVERY MORNING   methocarbamol (ROBAXIN) 500 MG tablet Take 1 tablet (500 mg total) by mouth every 6 (six) hours as needed for muscle spasms.   Multiple Vitamin (MULTIVITAMIN WITH MINERALS) TABS tablet Take 1 tablet by mouth in the morning.   ondansetron (ZOFRAN-ODT) 8 MG disintegrating tablet Place 1 tablet every 8 hours by translingual route as needed for nausea for 5 days.   polyethylene glycol (MIRALAX / GLYCOLAX) 17 g packet Take 17 g by mouth 2 (two) times daily. (Patient taking differently: Take 17 g by mouth in the morning.)   rosuvastatin (CRESTOR) 10 MG tablet Take 10 mg by mouth every evening.   sertraline (ZOLOFT) 100 MG tablet Take 1 tablet (100 mg total) by mouth daily.   No current facility-administered medications on file prior to visit.   Medications Discontinued During This Encounter  Medication Reason   ondansetron (ZOFRAN) 4 MG tablet          Clinical Data Analysis:   Physical Exam  There were no vitals taken for this visit. Wt Readings from Last 10 Encounters:  10/15/22 184 lb 15.5 oz (83.9 kg)  08/30/22 180 lb (81.6 kg)  07/12/22 194 lb 0.1 oz (88 kg)  04/06/22 189 lb 12.8 oz (86.1 kg)  03/01/22 189 lb 9.6 oz (86 kg)  01/04/22 184 lb (83.5 kg)  12/22/21 183  lb 12.8 oz (83.4 kg)  12/14/21 182 lb (82.6 kg)  12/10/21 183 lb (83 kg)  11/22/21 182 lb (82.6 kg)   Vital signs reviewed.  Nursing notes reviewed. Weight trend reviewed. Abnormalities and Problem-Specific physical exam findings:  Physical Exam   MEASUREMENTS: WT- 180, Ht- 5'8" SKIN: Healing pressure ulcer present       General Appearance:  No acute distress appreciable.   Well-groomed, healthy-appearing male.  Well proportioned with no abnormal fat distribution.  Good muscle tone. Pulmonary:  Normal work of breathing at rest, no respiratory distress apparent.    Musculoskeletal: All extremities are intact.  Neurological:  Awake, alert, oriented, and engaged.  No obvious focal neurological deficits or cognitive impairments.  Sensorium seems unclouded.   Speech is clear and coherent with logical content. Psychiatric:  Appropriate mood, pleasant and cooperative demeanor, thoughtful and engaged during the exam  Results Reviewed:     No results found for any visits on 11/17/22.  Admission on 10/07/2022, Discharged on 10/18/2022  Component Date Value   Sodium 10/07/2022 133 (L)    Potassium 10/07/2022 3.9    Chloride 10/07/2022 103    CO2 10/07/2022 22    Glucose, Bld 10/07/2022 110 (H)    BUN 10/07/2022 22    Creatinine, Ser 10/07/2022 1.02    Calcium 10/07/2022 8.4 (L)    GFR, Estimated 10/07/2022 >60    Anion gap 10/07/2022 8    WBC 10/07/2022 11.5 (H)    RBC 10/07/2022 4.86    Hemoglobin 10/07/2022 14.6    HCT 10/07/2022 43.1    MCV 10/07/2022 88.7    MCH 10/07/2022 30.0    MCHC 10/07/2022 33.9    RDW 10/07/2022 13.0    Platelets 10/07/2022 271    nRBC 10/07/2022 0.0    Neutrophils Relative % 10/07/2022 76    Neutro Abs 10/07/2022 8.8 (H)    Lymphocytes Relative  10/07/2022 7    Lymphs Abs 10/07/2022 0.8    Monocytes Relative 10/07/2022 11    Monocytes Absolute 10/07/2022 1.3 (H)    Eosinophils Relative 10/07/2022 4    Eosinophils Absolute 10/07/2022 0.5     Basophils Relative 10/07/2022 1    Basophils Absolute 10/07/2022 0.1    Immature Granulocytes 10/07/2022 1    Abs Immature Granulocytes 10/07/2022 0.06    Specimen Description 10/07/2022                     Value:WOUND RIGHT KNEE DEHISCENCE Performed at [facility], [address] [place], [place] [address]    Special Requests 10/07/2022                     Value:NONE Performed at [facility], [address], [place], [place] [address]    Gram Stain 10/07/2022                     Value:RARE WBC PRESENT, PREDOMINANTLY PMN NO ORGANISMS SEEN    Culture 10/07/2022                     Value:No growth aerobically or anaerobically. Performed at [facility], [address], [place], [place] [address]    Report Status 10/07/2022 10/12/2022 FINAL   Admission on 08/30/2022, Discharged on 09/05/2022  Component Date Value   WBC 08/31/2022 8.6    RBC 08/31/2022 4.07 (L)    Hemoglobin 08/31/2022 12.5 (L)    HCT 08/31/2022 37.1 (L)    MCV 08/31/2022 91.2    MCH 08/31/2022 30.7    MCHC 08/31/2022 33.7    RDW 08/31/2022 12.4    Platelets 08/31/2022 228    nRBC 08/31/2022 0.0    Sodium 08/31/2022 136    Potassium 08/31/2022 3.9    Chloride 08/31/2022 104    CO2 08/31/2022 23    Glucose, Bld 08/31/2022 159 (H)    BUN 08/31/2022 23    Creatinine, Ser 08/31/2022 1.01    Calcium 08/31/2022 8.6 (L)    GFR, Estimated 08/31/2022 >60    Anion gap 08/31/2022 9    WBC 09/01/2022 9.6    RBC 09/01/2022 4.19 (L)    Hemoglobin 09/01/2022 12.9 (L)    HCT 09/01/2022 38.8 (L)    MCV 09/01/2022 92.6    MCH 09/01/2022 30.8    MCHC 09/01/2022 33.2    RDW 09/01/2022 13.0    Platelets 09/01/2022 257    nRBC 09/01/2022 0.0   Admission on 07/08/2022, Discharged on 07/15/2022  Component Date Value   WBC 07/08/2022 17.6 (H)    RBC 07/08/2022 4.33    Hemoglobin 07/08/2022 13.5    HCT 07/08/2022 39.3    MCV 07/08/2022 90.8    MCH 07/08/2022 31.2    MCHC  07/08/2022 34.4    RDW 07/08/2022 12.9    Platelets 07/08/2022 330    nRBC 07/08/2022 0.0    Neutrophils Relative % 07/08/2022 74    Neutro Abs 07/08/2022 13.2 (H)    Lymphocytes Relative 07/08/2022 11    Lymphs Abs 07/08/2022 1.9    Monocytes Relative 07/08/2022 12    Monocytes Absolute 07/08/2022 2.0 (H)    Eosinophils Relative 07/08/2022 1    Eosinophils Absolute 07/08/2022 0.2    Basophils Relative 07/08/2022 1    Basophils Absolute 07/08/2022 0.1    Immature Granulocytes 07/08/2022 1    Abs Immature Granulocytes 07/08/2022 0.24 (  H)    Sodium 07/08/2022 134 (L)    Potassium 07/08/2022 4.1    Chloride 07/08/2022 103    CO2 07/08/2022 22    Glucose, Bld 07/08/2022 119 (H)    BUN 07/08/2022 42 (H)    Creatinine, Ser 07/08/2022 1.52 (H)    Calcium 07/08/2022 8.7 (L)    Total Protein 07/08/2022 7.0    Albumin 07/08/2022 3.9    AST 07/08/2022 20    ALT 07/08/2022 20    Alkaline Phosphatase 07/08/2022 74    Total Bilirubin 07/08/2022 0.7    GFR, Estimated 07/08/2022 45 (L)    Anion gap 07/08/2022 9    Total CK 07/08/2022 139    Color, Urine 07/08/2022 YELLOW    APPearance 07/08/2022 HAZY (A)    Specific Gravity, Urine 07/08/2022 1.017    pH 07/08/2022 5.0    Glucose, UA 07/08/2022 NEGATIVE    Hgb urine dipstick 07/08/2022 MODERATE (A)    Bilirubin Urine 07/08/2022 NEGATIVE    Ketones, ur 07/08/2022 NEGATIVE    Protein, ur 07/08/2022 NEGATIVE    Nitrite 07/08/2022 NEGATIVE    Leukocytes,Ua 07/08/2022 NEGATIVE    RBC / HPF 07/08/2022 >50    WBC, UA 07/08/2022 6-10    Bacteria, UA 07/08/2022 NONE SEEN    Squamous Epithelial / HPF 07/08/2022 0-5    Mucus 07/08/2022 PRESENT    Hyaline Casts, UA 07/08/2022 PRESENT    WBC 07/08/2022 15.9 (H)    RBC 07/08/2022 4.20 (L)    Hemoglobin 07/08/2022 13.0    HCT 07/08/2022 37.8 (L)    MCV 07/08/2022 90.0    MCH 07/08/2022 31.0    MCHC 07/08/2022 34.4    RDW 07/08/2022 13.0    Platelets 07/08/2022 309    nRBC 07/08/2022 0.0     Creatinine, Ser 07/08/2022 1.58 (H)    GFR, Estimated 07/08/2022 43 (L)    Sodium 07/09/2022 134 (L)    Potassium 07/09/2022 3.6    Chloride 07/09/2022 105    CO2 07/09/2022 24    Glucose, Bld 07/09/2022 140 (H)    BUN 07/09/2022 36 (H)    Creatinine, Ser 07/09/2022 1.36 (H)    Calcium 07/09/2022 8.3 (L)    Total Protein 07/09/2022 6.2 (L)    Albumin 07/09/2022 3.4 (L)    AST 07/09/2022 17    ALT 07/09/2022 15    Alkaline Phosphatase 07/09/2022 68    Total Bilirubin 07/09/2022 0.6    GFR, Estimated 07/09/2022 52 (L)    Anion gap 07/09/2022 5    WBC 07/09/2022 11.1 (H)    RBC 07/09/2022 3.99 (L)    Hemoglobin 07/09/2022 12.5 (L)    HCT 07/09/2022 36.7 (L)    MCV 07/09/2022 92.0    MCH 07/09/2022 31.3    MCHC 07/09/2022 34.1    RDW 07/09/2022 13.1    Platelets 07/09/2022 291    nRBC 07/09/2022 0.0    Magnesium 07/09/2022 2.4    Phosphorus 07/09/2022 3.7    WBC 07/10/2022 10.1    RBC 07/10/2022 3.81 (L)    Hemoglobin 07/10/2022 12.0 (L)    HCT 07/10/2022 35.4 (L)    MCV 07/10/2022 92.9    MCH 07/10/2022 31.5    MCHC 07/10/2022 33.9    RDW 07/10/2022 13.2    Platelets 07/10/2022 290    nRBC 07/10/2022 0.0    Sodium 07/10/2022 138    Potassium 07/10/2022 3.9    Chloride 07/10/2022 108    CO2 07/10/2022 24    Glucose, Bld 07/10/2022  108 (H)    BUN 07/10/2022 35 (H)    Creatinine, Ser 07/10/2022 1.07    Calcium 07/10/2022 8.5 (L)    GFR, Estimated 07/10/2022 >60    Anion gap 07/10/2022 6    Sodium 07/11/2022 136    Potassium 07/11/2022 4.4    Chloride 07/11/2022 105    CO2 07/11/2022 24    Glucose, Bld 07/11/2022 106 (H)    BUN 07/11/2022 36 (H)    Creatinine, Ser 07/11/2022 1.10    Calcium 07/11/2022 8.5 (L)    GFR, Estimated 07/11/2022 >60    Anion gap 07/11/2022 7    Sodium 07/12/2022 136    Potassium 07/12/2022 4.0    Chloride 07/12/2022 105    CO2 07/12/2022 25    Glucose, Bld 07/12/2022 105 (H)    BUN 07/12/2022 31 (H)    Creatinine, Ser 07/12/2022  1.09    Calcium 07/12/2022 8.6 (L)    GFR, Estimated 07/12/2022 >60    Anion gap 07/12/2022 6    WBC 07/12/2022 11.5 (H)    RBC 07/12/2022 3.81 (L)    Hemoglobin 07/12/2022 11.8 (L)    HCT 07/12/2022 35.1 (L)    MCV 07/12/2022 92.1    MCH 07/12/2022 31.0    MCHC 07/12/2022 33.6    RDW 07/12/2022 13.2    Platelets 07/12/2022 265    nRBC 07/12/2022 0.0    MRSA, PCR 07/12/2022 NEGATIVE    Staphylococcus aureus 07/12/2022 NEGATIVE    WBC 07/13/2022 8.7    RBC 07/13/2022 3.43 (L)    Hemoglobin 07/13/2022 10.7 (L)    HCT 07/13/2022 31.7 (L)    MCV 07/13/2022 92.4    MCH 07/13/2022 31.2    MCHC 07/13/2022 33.8    RDW 07/13/2022 13.2    Platelets 07/13/2022 248    nRBC 07/13/2022 0.0    Sodium 07/13/2022 137    Potassium 07/13/2022 3.8    Chloride 07/13/2022 109    CO2 07/13/2022 21 (L)    Glucose, Bld 07/13/2022 159 (H)    BUN 07/13/2022 28 (H)    Creatinine, Ser 07/13/2022 0.97    Calcium 07/13/2022 7.5 (L)    GFR, Estimated 07/13/2022 >60    Anion gap 07/13/2022 7    WBC 07/14/2022 18.3 (H)    RBC 07/14/2022 3.74 (L)    Hemoglobin 07/14/2022 11.5 (L)    HCT 07/14/2022 34.1 (L)    MCV 07/14/2022 91.2    MCH 07/14/2022 30.7    MCHC 07/14/2022 33.7    RDW 07/14/2022 13.2    Platelets 07/14/2022 285    nRBC 07/14/2022 0.0   Procedure visit on 03/09/2022  Component Date Value   Hgb A1c MFr Bld 03/09/2022 5.5    Anti Nuclear Antibody (A* 03/09/2022 Negative    IgG (Immunoglobin G), Se* 03/09/2022 1,004    IgA/Immunoglobulin A, Se* 03/09/2022 169    IgM (Immunoglobulin M), * 03/09/2022 80    Total Protein 03/09/2022 7.1    Albumin SerPl Elph-Mcnc 03/09/2022 4.1    Alpha 1 03/09/2022 0.3    Alpha2 Glob SerPl Elph-M* 03/09/2022 0.8    B-Globulin SerPl Elph-Mc* 03/09/2022 0.9    Gamma Glob SerPl Elph-Mc* 03/09/2022 1.0    M Protein SerPl Elph-Mcnc 03/09/2022 Not Observed    Globulin, Total 03/09/2022 3.0    Albumin/Glob SerPl 03/09/2022 1.4    IFE 1 03/09/2022 Comment     Please Note 03/09/2022 Comment    CRP 03/09/2022 2    Sed Rate 03/09/2022 CANCELED  Total CK 03/09/2022 68   Office Visit on 03/01/2022  Component Date Value   Color, Urine 03/01/2022 YELLOW    APPearance 03/01/2022 CLEAR    Specific Gravity, Urine 03/01/2022 1.020    pH 03/01/2022 6.0    Total Protein, Urine 03/01/2022 NEGATIVE    Urine Glucose 03/01/2022 NEGATIVE    Ketones, ur 03/01/2022 NEGATIVE    Bilirubin Urine 03/01/2022 NEGATIVE    Hgb urine dipstick 03/01/2022 NEGATIVE    Urobilinogen, UA 03/01/2022 1.0    Leukocytes,Ua 03/01/2022 NEGATIVE    Nitrite 03/01/2022 NEGATIVE    WBC, UA 03/01/2022 none seen    RBC / HPF 03/01/2022 none seen   Office Visit on 11/22/2021  Component Date Value   Vitamin B-12 11/22/2021 497    Folate 11/22/2021 >23.9    Methylmalonic Acid, Quant 11/22/2021 187    RPR Ser Ql 11/22/2021 NON-REACTIVE    WBC 11/22/2021 6.3    RBC 11/22/2021 4.54    Platelets 11/22/2021 254.0    Hemoglobin 11/22/2021 14.5    HCT 11/22/2021 42.8    MCV 11/22/2021 94.3    MCHC 11/22/2021 33.8    RDW 11/22/2021 14.1    Sodium 11/22/2021 136    Potassium 11/22/2021 4.4    Chloride 11/22/2021 103    CO2 11/22/2021 26    Glucose, Bld 11/22/2021 99    BUN 11/22/2021 23    Creatinine, Ser 11/22/2021 1.34    Total Bilirubin 11/22/2021 0.7    Alkaline Phosphatase 11/22/2021 82    AST 11/22/2021 18    ALT 11/22/2021 15    Total Protein 11/22/2021 6.9    Albumin 11/22/2021 4.3    GFR 11/22/2021 49.36 (L)    Calcium 11/22/2021 9.3    No image results found.   DG Knee Right Port  Result Date: 10/07/2022 CLINICAL DATA:  82 year old male status post fall. Two previous right quadriceps surgeries, most recently 08/30/2022. EXAM: PORTABLE RIGHT KNEE - 1-2 VIEW COMPARISON:  Right knee series 07/05/2022. FINDINGS: Portable cross-table lateral view at 0714 hours. Indistinct soft tissue gas superior to the patella, may be resolving postoperative change in this setting.  Small joint effusion is possible, but regressed compared to 07/05/2022. Patella positioning is within normal limits. Portable AP view, some chronic medial compartment joint space loss. No acute osseous abnormality identified. Calcified peripheral vascular disease. IMPRESSION: 1. Indistinct soft tissue gas and thickening superior to the patella, might be resolving postoperative change in this setting. Correlate for any signs/symptoms of infection. 2. No acute fracture or dislocation identified about the right knee. Electronically Signed   By: Odessa Fleming M.D.   On: 10/07/2022 07:24    DG Knee Right Port  Result Date: 10/07/2022 CLINICAL DATA:  82 year old male status post fall. Two previous right quadriceps surgeries, most recently 08/30/2022. EXAM: PORTABLE RIGHT KNEE - 1-2 VIEW COMPARISON:  Right knee series 07/05/2022. FINDINGS: Portable cross-table lateral view at 0714 hours. Indistinct soft tissue gas superior to the patella, may be resolving postoperative change in this setting. Small joint effusion is possible, but regressed compared to 07/05/2022. Patella positioning is within normal limits. Portable AP view, some chronic medial compartment joint space loss. No acute osseous abnormality identified. Calcified peripheral vascular disease. IMPRESSION: 1. Indistinct soft tissue gas and thickening superior to the patella, might be resolving postoperative change in this setting. Correlate for any signs/symptoms of infection. 2. No acute fracture or dislocation identified about the right knee. Electronically Signed   By: Odessa Fleming M.D.   On:  10/07/2022 07:24     Additional Notes:   This encounter employed real-time, collaborative documentation. The patient actively reviewed and updated their medical record on a shared screen, ensuring transparency and facilitating joint problem-solving for the problem list, overview, and plan. This approach promotes accurate, informed care. The treatment plan was discussed and  reviewed in detail, including medication safety, potential side effects, and all patient questions. We confirmed understanding and comfort with the plan. Follow-up instructions were established, including contacting the office for any concerns, returning if symptoms worsen, persist, or new symptoms develop, and precautions for potential emergency department visits. ----------------------------------------------------- Lula Olszewski, MD  11/17/2022 8:18 PM  [facility]:  [phone]

## 2022-11-17 NOTE — Telephone Encounter (Signed)
Patient requests to be called asap re: FL2 Forms need to be sent in asap this morning.

## 2022-11-17 NOTE — Telephone Encounter (Signed)
Patient called for an update. States they need form ASAP so he can move in. Patient's daughter then got on the phone and stated all of his furniture is gone from the house and he needs to move in by tonight at the latest. Patient can be reached @ [phone] and Raynelle Fanning, his daughter can be reached @ [phone].   The representative at [facility] who is handling this paperwork is Shanda Bumps who can be reached @ [phone].

## 2022-11-17 NOTE — Assessment & Plan Note (Signed)
Ruptured Quadriceps Tendon He has experienced two ruptures of the quadriceps tendon, severely impacting his ability to walk and necessitating two-person assistance for transfers. He is awaiting two locking braces for his legs, expected to enhance mobility. We will continue the current plan of waiting for the braces and reassess his mobility and care plan upon receipt.

## 2022-11-21 ENCOUNTER — Telehealth: Payer: Self-pay | Admitting: Internal Medicine

## 2022-11-21 NOTE — Telephone Encounter (Signed)
This form has already been faxed to [facility] last week.

## 2022-11-21 NOTE — Telephone Encounter (Signed)
Caller called in regards to a separate issue but stated that patient has been getting medications regularly.

## 2022-11-21 NOTE — Telephone Encounter (Signed)
Patient dropped off document Home Health Certificate (Order ID [id number]), to be filled out by provider. Patient requested to send it back via Fax . Document is located in providers tray at front office.Please advise

## 2022-11-21 NOTE — Telephone Encounter (Signed)
Caller was Glenice Bow, Environmental manager, from [facility]. States that patient has a bruise on his mid interior right thigh. The blood has started to pool and move down his leg, but he has no pain, not even with his ROM. States they would like an order for x-ray imaging. They use dynamic mobile for this.   Order can be called in to [phone] or place online.

## 2022-11-21 NOTE — Telephone Encounter (Signed)
Patient called stating he is not getting any medication assistance from facility despite speaking with multiple employees/higher up. He is wanting to know if he can have seroquel restarted and a 30 day supply sent in.

## 2022-11-22 ENCOUNTER — Other Ambulatory Visit: Payer: Self-pay

## 2022-11-22 DIAGNOSIS — F331 Major depressive disorder, recurrent, moderate: Secondary | ICD-10-CM

## 2022-11-22 MED ORDER — QUETIAPINE FUMARATE 25 MG PO TABS: 25.0000 mg | ORAL_TABLET | Freq: Every day | ORAL | 0 refills | Status: DC

## 2022-11-22 NOTE — Telephone Encounter (Signed)
Forms have been faxed 

## 2022-11-22 NOTE — Telephone Encounter (Signed)
Left vm to call the office back concerning this.

## 2022-11-22 NOTE — Telephone Encounter (Signed)
Spoke with patient and resent the prescription to the requested pharmacy.

## 2022-11-23 ENCOUNTER — Telehealth: Payer: Self-pay | Admitting: Internal Medicine

## 2022-11-23 ENCOUNTER — Other Ambulatory Visit: Payer: Self-pay

## 2022-11-23 DIAGNOSIS — F331 Major depressive disorder, recurrent, moderate: Secondary | ICD-10-CM

## 2022-11-23 MED ORDER — QUETIAPINE FUMARATE 25 MG PO TABS: 25.0000 mg | ORAL_TABLET | Freq: Every day | ORAL | 0 refills | Status: DC

## 2022-11-23 MED ORDER — QUETIAPINE FUMARATE 25 MG PO TABS
25.0000 mg | ORAL_TABLET | Freq: Every day | ORAL | 3 refills | Status: DC
Start: 2022-11-23 — End: 2022-12-12

## 2022-11-23 NOTE — Telephone Encounter (Signed)
Sent to requested pharmacy.

## 2022-11-23 NOTE — Telephone Encounter (Signed)
Pt called back and states he needed the RX to be a 30 day supply with 3 refills

## 2022-11-23 NOTE — Telephone Encounter (Signed)
Updated seroquel prescription to 30 with 3 refills.

## 2022-11-23 NOTE — Telephone Encounter (Signed)
Pt states the RX  QUEtiapine (SEROQUEL) 25 MG tablet  was sent to the wrong pharmacy, can you resend to  [facility] [address] [place] - [address] [phone] [address] [place] [address]  Please advise.

## 2022-11-25 ENCOUNTER — Telehealth: Payer: Self-pay | Admitting: Internal Medicine

## 2022-11-25 NOTE — Telephone Encounter (Signed)
Called and offered patient 1:40 vv today due to last minute cancellation. Patient voice gratitude but declined since bruise is looking much better/improving.

## 2022-11-25 NOTE — Telephone Encounter (Signed)
Patient dropped off document  Personal care physician authorization and care plan , to be filled out by provider. Patient requested to send it back via Fax within 5-days. Document is located in providers tray at front office.Please advise at Mobile [phone] (mobile)

## 2022-11-30 NOTE — Telephone Encounter (Signed)
Faxed this form back yesterday.

## 2022-12-01 ENCOUNTER — Other Ambulatory Visit: Payer: Self-pay

## 2022-12-01 MED ORDER — TIZANIDINE HCL 2 MG PO TABS: 2.0000 mg | ORAL_TABLET | Freq: Three times a day (TID) | ORAL | 0 refills | Status: DC | PRN

## 2022-12-01 NOTE — Telephone Encounter (Signed)
Noted  

## 2022-12-07 ENCOUNTER — Other Ambulatory Visit: Payer: Self-pay

## 2022-12-07 ENCOUNTER — Telehealth: Payer: Self-pay | Admitting: Internal Medicine

## 2022-12-07 NOTE — Telephone Encounter (Signed)
Discontinued tizanidine.

## 2022-12-07 NOTE — Telephone Encounter (Signed)
Patient called requesting to know why tizanadine was called in for him on 9/5. He states he was informed it was a muscle relaxer which is something he doesn't need. Patient is requesting this rx order be canceled since he doesn't need it.

## 2022-12-09 ENCOUNTER — Telehealth: Payer: Self-pay | Admitting: Internal Medicine

## 2022-12-09 NOTE — Telephone Encounter (Signed)
Patient requests to be called re: Medications are on his list at Assisted Living that he no longer takes-it is causing an issue at Assisted Living Facility

## 2022-12-09 NOTE — Telephone Encounter (Signed)
Patient called back and [facility] so that we could call them and let them know he is only taking the medications listed below. Patient states he wants all the other medications taken off of his med list.  Losartan Rosuvastatin,  Flecainide Duloxetine   [facility]-- [phone]

## 2022-12-12 ENCOUNTER — Other Ambulatory Visit: Payer: Self-pay

## 2022-12-12 ENCOUNTER — Telehealth: Payer: Self-pay | Admitting: Internal Medicine

## 2022-12-12 NOTE — Telephone Encounter (Signed)
Pt's spouse called stating she got VM from PCP office asking about patient's medications. Caller states patient should be contacted directly @ [phone], his cell phone number due to being is assisted living.

## 2022-12-12 NOTE — Telephone Encounter (Signed)
Please call the above number, pt needs to talk to you about meds.

## 2022-12-12 NOTE — Telephone Encounter (Signed)
Spoke with [facility] and was told to fax updated medication list to them. Updated medication list to the listed medications per patient's request and faxed to [facility] with [facility] pharmacy.

## 2022-12-14 NOTE — Telephone Encounter (Signed)
Tiffany spoke with patient and updated medication list. Nothing further needed at this time.

## 2022-12-15 ENCOUNTER — Telehealth: Payer: Self-pay

## 2022-12-15 NOTE — Telephone Encounter (Signed)
Pt's wife, [name], called stating that the pt was now in assisted living at [facility] and would no longer need any vascular appts.

## 2022-12-16 NOTE — Telephone Encounter (Signed)
Pt is calling again about the med list. Still some confusion.

## 2022-12-16 NOTE — Telephone Encounter (Signed)
Pleased call pt back at [phone]

## 2022-12-16 NOTE — Telephone Encounter (Signed)
Patient is requesting that Tiffany re-fax the list of the ONLY four medications to [facility] [place]. States it needs to include the names of the four medications he is taking and that he is not taking any of the other past medications. This can be made attention to Darel Hong and faxed to [phone].

## 2022-12-16 NOTE — Telephone Encounter (Signed)
Caller was Darel Hong from [employer] called back to clarify patient's message below.   States needs PCP to fax over a discharge list of medications that PCP does not want him to be taking to [phone], attention to [place]. States they have to have a specific order from PCP to stop giving meds.

## 2022-12-19 ENCOUNTER — Other Ambulatory Visit: Payer: Self-pay

## 2022-12-20 ENCOUNTER — Other Ambulatory Visit: Payer: Self-pay

## 2022-12-23 ENCOUNTER — Telehealth: Payer: Self-pay | Admitting: Internal Medicine

## 2022-12-23 NOTE — Telephone Encounter (Signed)
Patient called requesting for Tiffany to call him, preferably by EOD.

## 2022-12-26 ENCOUNTER — Encounter: Payer: Self-pay | Admitting: Internal Medicine

## 2022-12-27 NOTE — Telephone Encounter (Signed)
Spoke with patient previously

## 2022-12-27 NOTE — Telephone Encounter (Signed)
Also spoke with patient previously concerning this.

## 2022-12-27 NOTE — Telephone Encounter (Signed)
Spoke with Darel Hong from [place] concerning this previously. We will work together to find an alternative solution if the documentation that I faxed previously is not accepted by the pharmacy.

## 2023-01-10 ENCOUNTER — Ambulatory Visit (INDEPENDENT_AMBULATORY_CARE_PROVIDER_SITE_OTHER): Payer: Medicare Other

## 2023-01-10 DIAGNOSIS — I480 Paroxysmal atrial fibrillation: Secondary | ICD-10-CM | POA: Diagnosis not present

## 2023-01-11 LAB — CUP PACEART REMOTE DEVICE CHECK
Date Time Interrogation Session: 20241014230451
Implantable Pulse Generator Implant Date: 20220728

## 2023-01-26 ENCOUNTER — Telehealth: Payer: Self-pay | Admitting: Cardiology

## 2023-01-26 NOTE — Telephone Encounter (Signed)
Patient would like to know if he can come off of his flecainide. He states that he is just trying to get off medications that may not be needed anymore. Advised that per Dr. Verl Dicker last note he recommended that he stay on it since it was helping to keep him in rhythm. He would like for me to check with Dr. Jacinto Halim again to see if anything has changed. Advised that Dr. Jacinto Halim is out of the office right now so it may be a while before we can get back to him. Patient verbalized understanding.

## 2023-01-26 NOTE — Telephone Encounter (Signed)
Left voicemail to return call to office.

## 2023-01-26 NOTE — Telephone Encounter (Signed)
Pt c/o medication issue:  1. Name of Medication: flecainide (TAMBOCOR) 100 MG tablet   2. How are you currently taking this medication (dosage and times per day)?    3. Are you having a reaction (difficulty breathing--STAT)? no  4. What is your medication issue? Calling to see if/when he can get off this medication. Please advise

## 2023-01-26 NOTE — Telephone Encounter (Signed)
Pt is returning a callback to nurse and is requesting a callback. Please advise

## 2023-01-27 ENCOUNTER — Telehealth: Payer: Self-pay | Admitting: Internal Medicine

## 2023-01-27 NOTE — Telephone Encounter (Signed)
Patient requests to be called re: Quetiapine 25 mg tablets. Wants to know why the above medication was prescribed and how does Patient stop taking them

## 2023-01-27 NOTE — Progress Notes (Signed)
Carelink Summary Report / Loop Recorder 

## 2023-01-27 NOTE — Telephone Encounter (Signed)
Patient called again. Requests a detailed message be left on voice mail if he is unable to answer the phone.

## 2023-01-27 NOTE — Telephone Encounter (Signed)
Left vm for patient concerning this, asked patient to call the office back (to confirm that he's not taking sertraline either). Neither medication is on his medication list.

## 2023-01-30 NOTE — Telephone Encounter (Signed)
Left message for patient to call back  

## 2023-01-30 NOTE — Telephone Encounter (Signed)
Spoke with the patient and advised on recommendations from Dr. Jacinto Halim. Patient verbalized understanding.

## 2023-02-13 ENCOUNTER — Ambulatory Visit (INDEPENDENT_AMBULATORY_CARE_PROVIDER_SITE_OTHER): Payer: Medicare Other

## 2023-02-13 DIAGNOSIS — I480 Paroxysmal atrial fibrillation: Secondary | ICD-10-CM

## 2023-02-13 LAB — CUP PACEART REMOTE DEVICE CHECK
Date Time Interrogation Session: 20241117231334
Implantable Pulse Generator Implant Date: 20220728

## 2023-02-21 ENCOUNTER — Telehealth: Payer: Self-pay | Admitting: Cardiology

## 2023-02-21 ENCOUNTER — Telehealth: Payer: Self-pay | Admitting: Internal Medicine

## 2023-02-21 ENCOUNTER — Other Ambulatory Visit: Payer: Self-pay

## 2023-02-21 DIAGNOSIS — I480 Paroxysmal atrial fibrillation: Secondary | ICD-10-CM

## 2023-02-21 MED ORDER — FLECAINIDE ACETATE 100 MG PO TABS: 100.0000 mg | ORAL_TABLET | Freq: Two times a day (BID) | ORAL | 0 refills | Status: DC

## 2023-02-21 NOTE — Telephone Encounter (Signed)
RX sent to requested Pharmacy

## 2023-02-21 NOTE — Telephone Encounter (Signed)
*  STAT* If patient is at the pharmacy, call can be transferred to refill team.   1. Which medications need to be refilled? (please list name of each medication and dose if known)   flecainide (TAMBOCOR) 100 MG tablet    2. Which pharmacy/location (including street and city if local pharmacy) is medication to be sent to? [facility] - [place], [place] - [address] Phone: [phone]  Fax: [phone]       Significant History/Details     3. Do they need a 30 day or 90 day supply? 90

## 2023-02-21 NOTE — Telephone Encounter (Signed)
Patient states he is taking over his medications now (previously done through assisted living):  Requests 90 supply RX for the following medication:  Prescription Request  02/21/2023  LOV: 07/08/2022  What is the name of the medication or equipment?  rosuvastatin (CRESTOR) 10 MG tablet    Have you contacted your pharmacy to request a refill? Yes   Which pharmacy would you like this sent to?   [facility] - [place], [place] - [address] [place] [address] Phone: [phone] Fax: [phone]    Patient notified that their request is being sent to the clinical staff for review and that they should receive a response within 2 business days.   Please advise at Mobile [phone] (mobile)

## 2023-02-27 ENCOUNTER — Other Ambulatory Visit: Payer: Self-pay

## 2023-02-27 DIAGNOSIS — E782 Mixed hyperlipidemia: Secondary | ICD-10-CM

## 2023-02-27 MED ORDER — ROSUVASTATIN CALCIUM 10 MG PO TABS: 10.0000 mg | ORAL_TABLET | Freq: Every evening | ORAL | 3 refills | Status: DC

## 2023-02-27 NOTE — Telephone Encounter (Signed)
Left pt detailed that medication has been called to [facility]

## 2023-02-27 NOTE — Telephone Encounter (Signed)
Called in #90 with 3 refills to [facility].

## 2023-03-02 ENCOUNTER — Telehealth: Payer: Self-pay | Admitting: Cardiology

## 2023-03-02 NOTE — Telephone Encounter (Signed)
*  STAT* If patient is at the pharmacy, call can be transferred to refill team.   1. Which medications need to be refilled? (please list name of each medication and dose if known) flecainide (TAMBOCOR) 100 MG tablet      4. Which pharmacy/location (including street and city if local pharmacy) is medication to be sent to?[id number] - [place], [place] - [address] Phone: [phone]  Fax: [phone]     5. Do they need a 30 day or 90 day supply? 90

## 2023-03-03 ENCOUNTER — Other Ambulatory Visit: Payer: Self-pay

## 2023-03-03 DIAGNOSIS — I480 Paroxysmal atrial fibrillation: Secondary | ICD-10-CM

## 2023-03-03 MED ORDER — FLECAINIDE ACETATE 100 MG PO TABS: 100.0000 mg | ORAL_TABLET | Freq: Two times a day (BID) | ORAL | 0 refills | Status: DC

## 2023-03-03 NOTE — Telephone Encounter (Signed)
 RX sent to requested Pharmacy

## 2023-03-03 NOTE — Telephone Encounter (Signed)
Patient's pharmacy is calling to get this medication called in for patient. States he will advice patient to call to schedule for appt.

## 2023-03-07 ENCOUNTER — Ambulatory Visit: Payer: Medicare Other | Admitting: Internal Medicine

## 2023-03-08 NOTE — Progress Notes (Signed)
Carelink Summary Report / Loop Recorder 

## 2023-03-20 ENCOUNTER — Ambulatory Visit (INDEPENDENT_AMBULATORY_CARE_PROVIDER_SITE_OTHER): Payer: Medicare Other

## 2023-03-20 DIAGNOSIS — I480 Paroxysmal atrial fibrillation: Secondary | ICD-10-CM

## 2023-03-20 LAB — CUP PACEART REMOTE DEVICE CHECK
Date Time Interrogation Session: 20241222231220
Implantable Pulse Generator Implant Date: 20220728

## 2023-03-24 ENCOUNTER — Ambulatory Visit (INDEPENDENT_AMBULATORY_CARE_PROVIDER_SITE_OTHER): Payer: Medicare Other | Admitting: Internal Medicine

## 2023-03-24 ENCOUNTER — Encounter: Payer: Self-pay | Admitting: Internal Medicine

## 2023-03-24 VITALS — BP 129/80 | HR 76 | Temp 97.4°F | Ht 68.0 in | Wt 192.6 lb

## 2023-03-24 DIAGNOSIS — I10 Essential (primary) hypertension: Secondary | ICD-10-CM

## 2023-03-24 DIAGNOSIS — R5383 Other fatigue: Secondary | ICD-10-CM

## 2023-03-24 DIAGNOSIS — T17908A Unspecified foreign body in respiratory tract, part unspecified causing other injury, initial encounter: Secondary | ICD-10-CM

## 2023-03-24 DIAGNOSIS — R053 Chronic cough: Secondary | ICD-10-CM | POA: Diagnosis not present

## 2023-03-24 DIAGNOSIS — D374 Neoplasm of uncertain behavior of colon: Secondary | ICD-10-CM

## 2023-03-24 DIAGNOSIS — I480 Paroxysmal atrial fibrillation: Secondary | ICD-10-CM

## 2023-03-24 DIAGNOSIS — E669 Obesity, unspecified: Secondary | ICD-10-CM

## 2023-03-24 DIAGNOSIS — G9332 Myalgic encephalomyelitis/chronic fatigue syndrome: Secondary | ICD-10-CM | POA: Diagnosis not present

## 2023-03-24 DIAGNOSIS — Z23 Encounter for immunization: Secondary | ICD-10-CM

## 2023-03-24 LAB — CBC WITH DIFFERENTIAL/PLATELET
Basophils Absolute: 0.1 10*3/uL (ref 0.0–0.1)
Basophils Relative: 0.8 % (ref 0.0–3.0)
Eosinophils Absolute: 0.9 10*3/uL — ABNORMAL HIGH (ref 0.0–0.7)
Eosinophils Relative: 10.7 % — ABNORMAL HIGH (ref 0.0–5.0)
HCT: 44.1 % (ref 39.0–52.0)
Hemoglobin: 14.7 g/dL (ref 13.0–17.0)
Lymphocytes Relative: 13.5 % (ref 12.0–46.0)
Lymphs Abs: 1.2 10*3/uL (ref 0.7–4.0)
MCHC: 33.4 g/dL (ref 30.0–36.0)
MCV: 91.5 fL (ref 78.0–100.0)
Monocytes Absolute: 1 10*3/uL (ref 0.1–1.0)
Monocytes Relative: 11.7 % (ref 3.0–12.0)
Neutro Abs: 5.5 10*3/uL (ref 1.4–7.7)
Neutrophils Relative %: 63.3 % (ref 43.0–77.0)
Platelets: 278 10*3/uL (ref 150.0–400.0)
RBC: 4.81 Mil/uL (ref 4.22–5.81)
RDW: 14.4 % (ref 11.5–15.5)
WBC: 8.7 10*3/uL (ref 4.0–10.5)

## 2023-03-24 LAB — COMPREHENSIVE METABOLIC PANEL
ALT: 12 U/L (ref 0–53)
AST: 16 U/L (ref 0–37)
Albumin: 4 g/dL (ref 3.5–5.2)
Alkaline Phosphatase: 102 U/L (ref 39–117)
BUN: 24 mg/dL — ABNORMAL HIGH (ref 6–23)
CO2: 26 meq/L (ref 19–32)
Calcium: 9.1 mg/dL (ref 8.4–10.5)
Chloride: 102 meq/L (ref 96–112)
Creatinine, Ser: 0.94 mg/dL (ref 0.40–1.50)
GFR: 74.82 mL/min (ref >60.00)
Glucose, Bld: 86 mg/dL (ref 70–99)
Potassium: 4.2 meq/L (ref 3.5–5.1)
Sodium: 138 meq/L (ref 135–145)
Total Bilirubin: 0.7 mg/dL (ref 0.2–1.2)
Total Protein: 6.3 g/dL (ref 6.0–8.3)

## 2023-03-24 LAB — URINALYSIS
Bilirubin Urine: NEGATIVE
Hgb urine dipstick: NEGATIVE
Ketones, ur: NEGATIVE
Leukocytes,Ua: NEGATIVE
Nitrite: NEGATIVE
Specific Gravity, Urine: 1.02 (ref 1.000–1.030)
Total Protein, Urine: NEGATIVE
Urine Glucose: NEGATIVE
Urobilinogen, UA: 1 (ref 0.0–1.0)
pH: 6.5 (ref 5.0–8.0)

## 2023-03-24 LAB — LIPID PANEL
Cholesterol: 129 mg/dL (ref 0–200)
HDL: 57.4 mg/dL (ref >39.00)
LDL Cholesterol: 57 mg/dL (ref 0–99)
NonHDL: 71.43
Total CHOL/HDL Ratio: 2
Triglycerides: 72 mg/dL (ref 0.0–149.0)
VLDL: 14.4 mg/dL (ref 0.0–40.0)

## 2023-03-24 LAB — VITAMIN B12: Vitamin B-12: 332 pg/mL (ref 211–911)

## 2023-03-24 LAB — TSH: TSH: 1.61 u[IU]/mL (ref 0.35–5.50)

## 2023-03-24 LAB — FOLATE: Folate: >24.2 ng/mL (ref >5.9)

## 2023-03-24 LAB — SEDIMENTATION RATE: Sed Rate: 23 mm/h — ABNORMAL HIGH (ref 0–20)

## 2023-03-26 NOTE — Progress Notes (Signed)
==============================  [facility]: [id number]   -- Medical Office Visit --  Patient: Brandon Mendoza.      Age: 82 y.o.       Sex:  male  Date:   03/24/2023 Today's Healthcare Provider: Lula Olszewski, MD  ==============================   CHIEF COMPLAINT: Annual Exam  SUBJECTIVE: 82 y.o. 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); History of left-sided carotid endarterectomy; Essential hypertension; OSA on CPAP; Normocytic anemia; Leukocytosis; AF (paroxysmal atrial fibrillation) (HCC); Chronic anticoagulation; Hyponatremia; Hematoma; Spinal stenosis of lumbar region; S/P lumbar fusion; Ataxia involving legs; Paroxysmal atrial fibrillation (HCC); Loop - Medtronic Linq 10/22/2020; Pain in joint of right knee; Acquired bilateral hammer toes; Acquired complex renal cyst; Acquired thrombophilia (HCC); Benign hypertension with 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); Constipation; 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HCC); Disequilibrium; Fall with injury; Falls; Functional gait abnormality; Gastroesophageal reflux disease without esophagitis; Idiopathic neuropathy; Ingrowing left great toenail; Other intervertebral disc degeneration, lumbar region; Pain in left foot; Pain in left knee; Pain in right foot; History of colonic polyps; Recurrent falls; Renal cyst; Tinea pedis of both feet; Urge incontinence; Urinary urgency; High arches; Hammertoes of both feet; Neuropathy; Generalized anxiety disorder; Primary osteoarthritis involving multiple joints; Claudication of lower extremity (HCC); Atrophy of calf muscles; Weakness generalized; Focal neurological deficit; Kyphosis; Memory impairment of gradual onset; Lightheadedness; Ataxia; Gait  abnormality; Spinal stenosis at L4-L5 level; Claw foot; Acquired clawfoot, left foot; Acquired clawfoot, right foot; Pain in joint of right hip; Lumbar spondylosis; Myofascial pain; Macular degeneration; Unable to care for self; Dehydration; Depression; COPD (chronic obstructive pulmonary disease) (HCC); Quadriceps tendon rupture, right, initial encounter; Quadriceps tendon rupture, right, subsequent encounter; Wound dehiscence; and Sialoadenitis of submandibular gland on their problem list.  History of Present Illness The patient, with a complex medical history, presents with a primary concern of chronic coughing, particularly during meals and upon waking in the morning. The patient describes the cough as productive, with expectoration of dime-sized yellow phlegm. This symptom onset approximately coincides with the patient's transition to assisted living, where they report receiving nutritionally balanced meals.  The patient also reports significant fatigue, which they attribute to their age of 82 years. Despite this, they express a desire to be more active and "spry." They note that they often feel drowsy during the day, but do not fully fall asleep.  In addition to these primary concerns, the patient has a longstanding issue with a weakened right leg due to a history of falls and subsequent surgeries. They report a significant improvement in their upper body strength and overall mobility due to intensive physical therapy. However, they still struggle with leg weakness and limited mobility, particularly in their right leg.  The patient also mentions a growth on their arm, which has been present for a long time. They express concern about the possibility of it being cancerous, but also note that it is not an urgent issue for them at this time.  Lastly, the patient discusses their transition to assisted living, which was initially challenging but has since improved. They express gratitude for the care  they have received and note a significant improvement in their overall health and wellbeing since their admission. They also mention plans to return home once renovations to their bathroom are completed.  Past Medical History -  High blood pressure - Menopause - Leg weakness - Skin growth on arm  Medications - Pravastatin - Tramadol  Social History - Patient does not drink alcohol  Note that patient  has a past medical history of Acquired clawfoot, right foot (12/16/2021), Anxiety, Atrophy of calf muscles (10/21/2021), Carotid artery occlusion, CKD (chronic kidney disease), COPD (chronic obstructive pulmonary disease) (HCC), Depression, Dysrhythmia, Focal neurological deficit (10/21/2021), colonic polyps, Hyperlipidemia, Hypertension, Kyphosis (10/21/2021), Loop - Medtronic Linq 10/22/2020 (10/22/2020), Nocturia, Paroxysmal atrial fibrillation (HCC), Peripheral neuropathy, Peripheral vascular disease (HCC), Sleep apnea, Spinal stenosis, Stroke (HCC) (05/01/2011), and Weakness generalized (10/21/2021).  Problem list overviews that were updated at today's visit:No problems updated.  Med reconciliation: Current Outpatient Medications on File Prior to Visit  Medication Sig   aspirin EC 81 MG tablet Take 81 mg by mouth daily. Swallow whole. Four times a week (Sunday, Monday, Wednesday and Friday).   DULoxetine (CYMBALTA) 30 MG capsule Take 1 capsule (30 mg total) by mouth at bedtime.   flecainide (TAMBOCOR) 100 MG tablet Take 1 tablet (100 mg total) by mouth 2 (two) times daily. (Patient taking differently: Take 50 mg by mouth 2 (two) times daily. Takes half of a 100 mg tablet.)   losartan-hydrochlorothiazide (HYZAAR) 50-12.5 MG tablet TAKE 1 TABLET EVERY MORNING   melatonin 5 MG TABS 1 tablet in the evening Orally Once a day   rosuvastatin (CRESTOR) 10 MG tablet Take 1 tablet (10 mg total) by mouth every evening.   sertraline (ZOLOFT) 50 MG tablet Take 50 mg by mouth daily.   No current  facility-administered medications on file prior to visit.  There are no discontinued medications.    Objective   Physical Exam     12 /27/2024   10:59 AM 03/24/2023   10:38 AM 10/18/2022    5:36 AM  Vitals with BMI  Height  5\' 8"    Weight  192 lbs 10 oz   BMI  29.29   Systolic 129 141 161  Diastolic 80 77 68  Pulse  76 63   Wt Readings from Last 10 Encounters:  03/24/23 192 lb 9.6 oz (87.4 kg)  10/15/22 184 lb 15.5 oz (83.9 kg)  08/30/22 180 lb (81.6 kg)  07/12/22 194 lb 0.1 oz (88 kg)  04/06/22 189 lb 12.8 oz (86.1 kg)  03/01/22 189 lb 9.6 oz (86 kg)  01/04/22 184 lb (83.5 kg)  12/22/21 183 lb 12.8 oz (83.4 kg)  12/14/21 182 lb (82.6 kg)  12/10/21 183 lb (83 kg)   Vital signs reviewed.  Nursing notes reviewed. Weight trend reviewed. Abnormalities and Problem-Specific physical exam findings:  wheelchair, knee brace, low right thigh atrophy, 8 mm warty lesion right dorsal forearm. Atypical mole on left clavicle  General Appearance:  No acute distress appreciable.   Well-groomed, healthy-appearing male.  Well proportioned with no abnormal fat distribution.  Good muscle tone. Pulmonary:  Normal work of breathing at rest, no respiratory distress apparent. SpO2: 97 %  Musculoskeletal: All extremities are intact.  Neurological:  Awake, alert, oriented, and engaged.  No obvious focal neurological deficits or cognitive impairments.  Sensorium seems unclouded.   Speech is clear and coherent with logical content. Psychiatric:  Appropriate mood, pleasant and cooperative demeanor, thoughtful and engaged during the exam    Results for orders placed or performed in visit on 03/24/23  Lipid panel  Result Value Ref Range   Cholesterol 129 0 - 200 mg/dL   Triglycerides 09.6 0.0 - 149.0 mg/dL   HDL 04.54 >09.81  mg/dL   VLDL 44.0 0.0 - 10.2 mg/dL   LDL Cholesterol 57 0 - 99 mg/dL   Total CHOL/HDL Ratio 2    NonHDL 71.43   CBC with Differential  Result Value Ref Range   WBC 8.7 4.0 -  10.5 K/uL   RBC 4.81 4.22 - 5.81 Mil/uL   Hemoglobin 14.7 13.0 - 17.0 g/dL   HCT 72.5 36.6 - 44.0 %   MCV 91.5 78.0 - 100.0 fl   MCHC 33.4 30.0 - 36.0 g/dL   RDW 34.7 42.5 - 95.6 %   Platelets 278.0 150.0 - 400.0 K/uL   Neutrophils Relative % 63.3 43.0 - 77.0 %   Lymphocytes Relative 13.5 12.0 - 46.0 %   Monocytes Relative 11.7 3.0 - 12.0 %   Eosinophils Relative 10.7 (H) 0.0 - 5.0 %   Basophils Relative 0.8 0.0 - 3.0 %   Neutro Abs 5.5 1.4 - 7.7 K/uL   Lymphs Abs 1.2 0.7 - 4.0 K/uL   Monocytes Absolute 1.0 0.1 - 1.0 K/uL   Eosinophils Absolute 0.9 (H) 0.0 - 0.7 K/uL   Basophils Absolute 0.1 0.0 - 0.1 K/uL  Comprehensive metabolic panel  Result Value Ref Range   Sodium 138 135 - 145 mEq/L   Potassium 4.2 3.5 - 5.1 mEq/L   Chloride 102 96 - 112 mEq/L   CO2 26 19 - 32 mEq/L   Glucose, Bld 86 70 - 99 mg/dL   BUN 24 (H) 6 - 23 mg/dL   Creatinine, Ser 3.87 0.40 - 1.50 mg/dL   Total Bilirubin 0.7 0.2 - 1.2 mg/dL   Alkaline Phosphatase 102 39 - 117 U/L   AST 16 0 - 37 U/L   ALT 12 0 - 53 U/L   Total Protein 6.3 6.0 - 8.3 g/dL   Albumin 4.0 3.5 - 5.2 g/dL   GFR 56.43 >32.95 mL/min   Calcium 9.1 8.4 - 10.5 mg/dL  Folate  Result Value Ref Range   Folate >24.2 >5.9 ng/mL  Sedimentation rate, automated  Result Value Ref Range   Sed Rate 23 (H) 0 - 20 mm/hr  TSH  Result Value Ref Range   TSH 1.61 0.35 - 5.50 uIU/mL  Vitamin B12  Result Value Ref Range   Vitamin B-12 332 211 - 911 pg/mL  Urinalysis  Result Value Ref Range   Color, Urine YELLOW Yellow;Lt. Yellow;Straw;Dark Yellow;Amber;Green;Red;Brown   APPearance CLEAR Clear;Turbid;Slightly Cloudy;Cloudy   Specific Gravity, Urine 1.020 1.000 - 1.030   pH 6.5 5.0 - 8.0   Total Protein, Urine NEGATIVE Negative   Urine Glucose NEGATIVE Negative   Ketones, ur NEGATIVE Negative   Bilirubin Urine NEGATIVE Negative   Hgb urine dipstick NEGATIVE Negative   Urobilinogen, UA 1.0 0.0 - 1.0   Leukocytes,Ua NEGATIVE Negative    Nitrite NEGATIVE Negative   Office Visit on 03/24/2023  Component Date Value   Cholesterol 03/24/2023 129    Triglycerides 03/24/2023 72.0    HDL 03/24/2023 57.40    VLDL 03/24/2023 14.4    LDL Cholesterol 03/24/2023 57    Total CHOL/HDL Ratio 03/24/2023 2    NonHDL 03/24/2023 71.43    WBC 03/24/2023 8.7    RBC 03/24/2023 4.81    Hemoglobin 03/24/2023 14.7    HCT 03/24/2023 44.1    MCV 03/24/2023 91.5    MCHC 03/24/2023 33.4    RDW 03/24/2023 14.4    Platelets 03/24/2023 278.0    Neutrophils Relative % 03/24/2023 63.3    Lymphocytes Relative 03/24/2023 13.5  Monocytes Relative 03/24/2023 11.7    Eosinophils Relative 03/24/2023 10.7 (H)    Basophils Relative 03/24/2023 0.8    Neutro Abs 03/24/2023 5.5    Lymphs Abs 03/24/2023 1.2    Monocytes Absolute 03/24/2023 1.0    Eosinophils Absolute 03/24/2023 0.9 (H)    Basophils Absolute 03/24/2023 0.1    Sodium 03/24/2023 138    Potassium 03/24/2023 4.2    Chloride 03/24/2023 102    CO2 03/24/2023 26    Glucose, Bld 03/24/2023 86    BUN 03/24/2023 24 (H)    Creatinine, Ser 03/24/2023 0.94    Total Bilirubin 03/24/2023 0.7    Alkaline Phosphatase 03/24/2023 102    AST 03/24/2023 16    ALT 03/24/2023 12    Total Protein 03/24/2023 6.3    Albumin 03/24/2023 4.0    GFR 03/24/2023 74.82    Calcium 03/24/2023 9.1    Folate 03/24/2023 >24.2    Sed Rate 03/24/2023 23 (H)    TSH 03/24/2023 1.61    Vitamin B-12 03/24/2023 332    Color, Urine 03/24/2023 YELLOW    APPearance 03/24/2023 CLEAR    Specific Gravity, Urine 03/24/2023 1.020    pH 03/24/2023 6.5    Total Protein, Urine 03/24/2023 NEGATIVE    Urine Glucose 03/24/2023 NEGATIVE    Ketones, ur 03/24/2023 NEGATIVE    Bilirubin Urine 03/24/2023 NEGATIVE    Hgb urine dipstick 03/24/2023 NEGATIVE    Urobilinogen, UA 03/24/2023 1.0    Leukocytes,Ua 03/24/2023 NEGATIVE    Nitrite 03/24/2023 NEGATIVE   Appointment on 03/20/2023  Component Date Value   Date Time  Interrogation * 03/19/2023 [id number]    Pulse Generator Manufact* 03/19/2023 MERM    Pulse Gen Model 03/19/2023 LNQ22 LINQ II    Pulse Gen Serial Number 03/19/2023 [id number]    Clinic Name 03/19/2023 [facility]    Implantable Pulse Genera* 03/19/2023 ICM/ILR    Implantable Pulse Genera* 03/19/2023 [id number]   Clinical Support on 02/13/2023  Component Date Value   Date Time Interrogation * 02/12/2023 [id number]    Pulse Generator Manufact* 02/12/2023 MERM    Pulse Gen Model 02/12/2023 LNQ22 LINQ II    Pulse Gen Serial Number 02/12/2023 [id number]    Clinic Name 02/12/2023 [facility]    Implantable Pulse Genera* 02/12/2023 ICM/ILR    Implantable Pulse Genera* 02/12/2023 [id number]   Clinical Support on 01/10/2023  Component Date Value   Date Time Interrogation * 01/09/2023 [id number]    Pulse Generator Manufact* 01/09/2023 MERM    Pulse Gen Model 01/09/2023 LNQ22 LINQ II    Pulse Gen Serial Number 01/09/2023 [id number]    Clinic Name 01/09/2023 [facility]    Implantable Pulse Genera* 01/09/2023 ICM/ILR    Implantable Pulse Genera* 01/09/2023 [id number]   Admission on 10/07/2022, Discharged on 10/18/2022  Component Date Value   Sodium 10/07/2022 133 (L)    Potassium 10/07/2022 3.9    Chloride 10/07/2022 103    CO2 10/07/2022 22    Glucose, Bld 10/07/2022 110 (H)    BUN 10/07/2022 22    Creatinine, Ser 10/07/2022 1.02    Calcium 10/07/2022 8.4 (L)    GFR, Estimated 10/07/2022 >60    Anion gap 10/07/2022 8    WBC 10/07/2022 11.5 (H)    RBC 10/07/2022 4.86    Hemoglobin 10/07/2022 14.6    HCT 10/07/2022 43.1    MCV 10/07/2022 88.7    MCH 10/07/2022 30.0    MCHC 10/07/2022 33.9    RDW 10/07/2022 13.0    Platelets 10/07/2022  271    nRBC 10/07/2022 0.0    Neutrophils Relative % 10/07/2022 76    Neutro Abs 10/07/2022 8.8 (H)    Lymphocytes Relative 10/07/2022 7    Lymphs Abs 10/07/2022 0.8    Monocytes Relative 10/07/2022 11    Monocytes Absolute  10/07/2022 1.3 (H)    Eosinophils Relative 10/07/2022 4    Eosinophils Absolute 10/07/2022 0.5    Basophils Relative 10/07/2022 1    Basophils Absolute 10/07/2022 0.1    Immature Granulocytes 10/07/2022 1    Abs Immature Granulocytes 10/07/2022 0.06    Specimen Description 10/07/2022                     Value:WOUND RIGHT KNEE DEHISCENCE Performed at [facility], [address], [place], [place] [address]    Special Requests 10/07/2022                     Value:NONE Performed at [facility], [address] [place], [place] [address]    Gram Stain 10/07/2022                     Value:RARE WBC PRESENT, PREDOMINANTLY PMN NO ORGANISMS SEEN    Culture 10/07/2022                     Value:No growth aerobically or anaerobically. Performed at [facility], [address] [place], [place] [address]    Report Status 10/07/2022 10/12/2022 FINAL   Admission on 08/30/2022, Discharged on 09/05/2022  Component Date Value   WBC 08/31/2022 8.6    RBC 08/31/2022 4.07 (L)    Hemoglobin 08/31/2022 12.5 (L)    HCT 08/31/2022 37.1 (L)    MCV 08/31/2022 91.2    MCH 08/31/2022 30.7    MCHC 08/31/2022 33.7    RDW 08/31/2022 12.4    Platelets 08/31/2022 228    nRBC 08/31/2022 0.0    Sodium 08/31/2022 136    Potassium 08/31/2022 3.9    Chloride 08/31/2022 104    CO2 08/31/2022 23    Glucose, Bld 08/31/2022 159 (H)    BUN 08/31/2022 23    Creatinine, Ser 08/31/2022 1.01    Calcium 08/31/2022 8.6 (L)    GFR, Estimated 08/31/2022 >60    Anion gap 08/31/2022 9    WBC 09/01/2022 9.6    RBC 09/01/2022 4.19 (L)    Hemoglobin 09/01/2022 12.9 (L)    HCT 09/01/2022 38.8 (L)    MCV 09/01/2022 92.6    MCH 09/01/2022 30.8    MCHC 09/01/2022 33.2    RDW 09/01/2022 13.0    Platelets 09/01/2022 257    nRBC 09/01/2022 0.0   Admission on 07/08/2022, Discharged on 07/15/2022  Component Date Value   WBC 07/08/2022 17.6 (H)    RBC 07/08/2022 4.33     Hemoglobin 07/08/2022 13.5    HCT 07/08/2022 39.3    MCV 07/08/2022 90.8    MCH 07/08/2022 31.2    MCHC 07/08/2022 34.4    RDW 07/08/2022 12.9    Platelets 07/08/2022 330    nRBC 07/08/2022 0.0    Neutrophils Relative % 07/08/2022 74    Neutro Abs 07/08/2022 13.2 (H)    Lymphocytes Relative 07/08/2022 11    Lymphs Abs 07/08/2022 1.9    Monocytes Relative 07/08/2022 12    Monocytes Absolute 07/08/2022 2.0 (H)    Eosinophils Relative 07/08/2022 1    Eosinophils Absolute 07/08/2022 0.2  Basophils Relative 07/08/2022 1    Basophils Absolute 07/08/2022 0.1    Immature Granulocytes 07/08/2022 1    Abs Immature Granulocytes 07/08/2022 0.24 (H)    Sodium 07/08/2022 134 (L)    Potassium 07/08/2022 4.1    Chloride 07/08/2022 103    CO2 07/08/2022 22    Glucose, Bld 07/08/2022 119 (H)    BUN 07/08/2022 42 (H)    Creatinine, Ser 07/08/2022 1.52 (H)    Calcium 07/08/2022 8.7 (L)    Total Protein 07/08/2022 7.0    Albumin 07/08/2022 3.9    AST 07/08/2022 20    ALT 07/08/2022 20    Alkaline Phosphatase 07/08/2022 74    Total Bilirubin 07/08/2022 0.7    GFR, Estimated 07/08/2022 45 (L)    Anion gap 07/08/2022 9    Total CK 07/08/2022 139    Color, Urine 07/08/2022 YELLOW    APPearance 07/08/2022 HAZY (A)    Specific Gravity, Urine 07/08/2022 1.017    pH 07/08/2022 5.0    Glucose, UA 07/08/2022 NEGATIVE    Hgb urine dipstick 07/08/2022 MODERATE (A)    Bilirubin Urine 07/08/2022 NEGATIVE    Ketones, ur 07/08/2022 NEGATIVE    Protein, ur 07/08/2022 NEGATIVE    Nitrite 07/08/2022 NEGATIVE    Leukocytes,Ua 07/08/2022 NEGATIVE    RBC / HPF 07/08/2022 >50    WBC, UA 07/08/2022 6-10    Bacteria, UA 07/08/2022 NONE SEEN    Squamous Epithelial / HPF 07/08/2022 0-5    Mucus 07/08/2022 PRESENT    Hyaline Casts, UA 07/08/2022 PRESENT    WBC 07/08/2022 15.9 (H)    RBC 07/08/2022 4.20 (L)    Hemoglobin 07/08/2022 13.0    HCT 07/08/2022 37.8 (L)    MCV 07/08/2022 90.0    MCH 07/08/2022  31.0    MCHC 07/08/2022 34.4    RDW 07/08/2022 13.0    Platelets 07/08/2022 309    nRBC 07/08/2022 0.0    Creatinine, Ser 07/08/2022 1.58 (H)    GFR, Estimated 07/08/2022 43 (L)    Sodium 07/09/2022 134 (L)    Potassium 07/09/2022 3.6    Chloride 07/09/2022 105    CO2 07/09/2022 24    Glucose, Bld 07/09/2022 140 (H)    BUN 07/09/2022 36 (H)    Creatinine, Ser 07/09/2022 1.36 (H)    Calcium 07/09/2022 8.3 (L)    Total Protein 07/09/2022 6.2 (L)    Albumin 07/09/2022 3.4 (L)    AST 07/09/2022 17    ALT 07/09/2022 15    Alkaline Phosphatase 07/09/2022 68    Total Bilirubin 07/09/2022 0.6    GFR, Estimated 07/09/2022 52 (L)    Anion gap 07/09/2022 5    WBC 07/09/2022 11.1 (H)    RBC 07/09/2022 3.99 (L)    Hemoglobin 07/09/2022 12.5 (L)    HCT 07/09/2022 36.7 (L)    MCV 07/09/2022 92.0    MCH 07/09/2022 31.3    MCHC 07/09/2022 34.1    RDW 07/09/2022 13.1    Platelets 07/09/2022 291    nRBC 07/09/2022 0.0    Magnesium 07/09/2022 2.4    Phosphorus 07/09/2022 3.7    WBC 07/10/2022 10.1    RBC 07/10/2022 3.81 (L)    Hemoglobin 07/10/2022 12.0 (L)    HCT 07/10/2022 35.4 (L)    MCV 07/10/2022 92.9    MCH 07/10/2022 31.5    MCHC 07/10/2022 33.9    RDW 07/10/2022 13.2    Platelets 07/10/2022 290    nRBC 07/10/2022 0.0    Sodium  07/10/2022 138    Potassium 07/10/2022 3.9    Chloride 07/10/2022 108    CO2 07/10/2022 24    Glucose, Bld 07/10/2022 108 (H)    BUN 07/10/2022 35 (H)    Creatinine, Ser 07/10/2022 1.07    Calcium 07/10/2022 8.5 (L)    GFR, Estimated 07/10/2022 >60    Anion gap 07/10/2022 6    Sodium 07/11/2022 136    Potassium 07/11/2022 4.4    Chloride 07/11/2022 105    CO2 07/11/2022 24    Glucose, Bld 07/11/2022 106 (H)    BUN 07/11/2022 36 (H)    Creatinine, Ser 07/11/2022 1.10    Calcium 07/11/2022 8.5 (L)    GFR, Estimated 07/11/2022 >60    Anion gap 07/11/2022 7    Sodium 07/12/2022 136    Potassium 07/12/2022 4.0    Chloride 07/12/2022 105    CO2  07/12/2022 25    Glucose, Bld 07/12/2022 105 (H)    BUN 07/12/2022 31 (H)    Creatinine, Ser 07/12/2022 1.09    Calcium 07/12/2022 8.6 (L)    GFR, Estimated 07/12/2022 >60    Anion gap 07/12/2022 6    WBC 07/12/2022 11.5 (H)    RBC 07/12/2022 3.81 (L)    Hemoglobin 07/12/2022 11.8 (L)    HCT 07/12/2022 35.1 (L)    MCV 07/12/2022 92.1    MCH 07/12/2022 31.0    MCHC 07/12/2022 33.6    RDW 07/12/2022 13.2    Platelets 07/12/2022 265    nRBC 07/12/2022 0.0    MRSA, PCR 07/12/2022 NEGATIVE    Staphylococcus aureus 07/12/2022 NEGATIVE    WBC 07/13/2022 8.7    RBC 07/13/2022 3.43 (L)    Hemoglobin 07/13/2022 10.7 (L)    HCT 07/13/2022 31.7 (L)    MCV 07/13/2022 92.4    MCH 07/13/2022 31.2    MCHC 07/13/2022 33.8    RDW 07/13/2022 13.2    Platelets 07/13/2022 248    nRBC 07/13/2022 0.0    Sodium 07/13/2022 137    Potassium 07/13/2022 3.8    Chloride 07/13/2022 109    CO2 07/13/2022 21 (L)    Glucose, Bld 07/13/2022 159 (H)    BUN 07/13/2022 28 (H)    Creatinine, Ser 07/13/2022 0.97    Calcium 07/13/2022 7.5 (L)    GFR, Estimated 07/13/2022 >60    Anion gap 07/13/2022 7    WBC 07/14/2022 18.3 (H)    RBC 07/14/2022 3.74 (L)    Hemoglobin 07/14/2022 11.5 (L)    HCT 07/14/2022 34.1 (L)    MCV 07/14/2022 91.2    MCH 07/14/2022 30.7    MCHC 07/14/2022 33.7    RDW 07/14/2022 13.2    Platelets 07/14/2022 285    nRBC 07/14/2022 0.0   No image results found. CUP PACEART REMOTE DEVICE CHECK Result Date: 03/20/2023 ILR summary report received. Battery status OK. Normal device function. No new symptom, tachy, brady, or pause episodes. 4 new AF episodes, longet duration 1hr, burden 1.2%, OAC contraindicated per PA report.  AF vs SR/ST with ectopy.  Monthly summary reports and ROV/PRN 1 false tachy event, oversesning of noise LA, CVRS  CUP PACEART REMOTE DEVICE CHECK Result Date: 02/13/2023 ILR summary report received. Battery status OK. Normal device function. No new symptom, brady,  or pause episodes. No new AF episodes. 2 Tachy episodes, 1 on 01/24/23 not previously reported to Triage, 5 sec, cannot exclude artifact oversensing, 194 bpm. Monthly summary reports and ROV/PRN. MC, CVRS  CUP PACEART REMOTE DEVICE  CHECK Result Date: 01/11/2023 ILR summary report received. Battery status OK. Normal device function. No new symptom, brady, or pause episodes. 12 new AF episodes, total burden 0.5%.  Longest AF lasted 1 hour, some EGMs show SR with ectopy, other EGMs indeterminate due to artifact.  Known history of PAF, on flecainide, OAC is contraindicated per Epic.  2 false tachy detections due to artifact. Monthly summary reports and ROV/PRN - CS, CVRS DG Knee Right Port Result Date: 10/07/2022 CLINICAL DATA:  82 year old male status post fall. Two previous right quadriceps surgeries, most recently 08/30/2022. EXAM: PORTABLE RIGHT KNEE - 1-2 VIEW COMPARISON:  Right knee series 07/05/2022. FINDINGS: Portable cross-table lateral view at 0714 hours. Indistinct soft tissue gas superior to the patella, may be resolving postoperative change in this setting. Small joint effusion is possible, but regressed compared to 07/05/2022. Patella positioning is within normal limits. Portable AP view, some chronic medial compartment joint space loss. No acute osseous abnormality identified. Calcified peripheral vascular disease. IMPRESSION: 1. Indistinct soft tissue gas and thickening superior to the patella, might be resolving postoperative change in this setting. Correlate for any signs/symptoms of infection. 2. No acute fracture or dislocation identified about the right knee. Electronically Signed   By: Odessa Fleming M.D.   On: 10/07/2022 07:24       Assessment & Plan Chronic fatigue syndrome Chronic Cough with Possible Aspiration They exhibit a chronic cough, especially when eating, accompanied by morning expectoration of yellow phlegm, suggesting possible aspiration since moving to assisted living. The  differential diagnosis includes aspiration, GERD, chronic bronchitis, or other pulmonary issues, with a preference for a pulmonary consultation expressed. We will order a chest x-ray, refer them to a pulmonologist, and order comprehensive blood work. Aspiration into airway, initial encounter  Other fatigue Fatigue They report fatigue and daytime drowsiness, with differential diagnoses including age-related fatigue, anemia, thyroid dysfunction, or other metabolic issues. It was discussed that their fatigue might be related to age and physical exertion. Comprehensive blood work will be ordered. Chronic cough Chronic Cough with Possible Aspiration They exhibit a chronic cough, especially when eating, accompanied by morning expectoration of yellow phlegm, suggesting possible aspiration since moving to assisted living. The differential diagnosis includes aspiration, GERD, chronic bronchitis, or other pulmonary issues, with a preference for a pulmonary consultation expressed. We will order a chest x-ray, refer them to a pulmonologist, and order comprehensive blood work. 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 We discussed monitoring and the potential need for antihypertensive therapy if consistently elevated. Regular blood pressure monitoring will be conducted, and antihypertensive therapy will be considered if consistently elevated. 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 A dermatology consultation is recommended for further evaluation and possible Mohs surgery if new. It was discussed that small lesions like this are often curable by freezing. We will refer them to a dermatologist and  schedule a follow-up appointment for possible cryotherapy if a dermatology appointment is not feasible.     Orders Placed During this Encounter:   Orders Placed This Encounter  Procedures   DG Chest 2 View    Standing Status:   Future    Expiration Date:   09/22/2023    Reason for Exam (SYMPTOM  OR DIAGNOSIS REQUIRED):   chronic cough  Preferred imaging location?:   [facility]   Lipid panel   CBC with Differential   Comprehensive metabolic panel   Folate   Sedimentation rate, automated   TSH   Vitamin B12   Urinalysis   Ambulatory referral to Pulmonology    Referral Priority:   Routine    Referral Type:   Consultation    Referral Reason:   Specialty Services Required    Requested Specialty:   Pulmonary Disease    Number of Visits Requested:   1   No orders of the defined types were placed in this encounter.   General Health Maintenance They are up-to-date with their flu vaccination. Annual flu vaccination will continue.  Follow-up A follow-up appointment will be scheduled soon after they return home.    This document was synthesized by artificial intelligence (Abridge) using HIPAA-compliant recording of the clinical interaction;   We discussed the use of AI scribe software for clinical note transcription with the patient, who gave verbal consent to proceed.    Additional Info: This encounter employed state-of-the-art, real-time, collaborative documentation. The patient actively reviewed and assisted in updating their electronic medical record on a shared screen, ensuring transparency and facilitating joint problem-solving for the problem list, overview, and plan. This approach promotes accurate, informed care. The treatment plan was discussed and reviewed in detail, including medication safety, potential side effects, and all patient questions. We confirmed understanding and comfort with the plan. Follow-up instructions were established, including contacting the office  for any concerns, returning if symptoms worsen, persist, or new symptoms develop, and precautions for potential emergency department visits.

## 2023-03-26 NOTE — Patient Instructions (Signed)
VISIT SUMMARY:  During today's visit, we discussed your chronic cough, fatigue, blood pressure, and a growth on your arm. We also reviewed your general health and made plans for follow-up care.  YOUR PLAN:  -CHRONIC COUGH WITH POSSIBLE ASPIRATION: Your chronic cough, especially when eating and in the morning, may be due to food or liquid entering your airways (aspiration). We will get a chest x-ray, refer you to a lung specialist, and do some blood tests to find the cause.  -FATIGUE: Your feeling of tiredness and daytime drowsiness could be related to your age or other health issues. We will do some blood tests to check for conditions like anemia or thyroid problems.  -HYPERTENSION: Your blood pressure was a bit high today, which might be due to being in the doctor's office (white coat hypertension). We will monitor your blood pressure regularly and consider medication if it stays high.  -NEOPLASM OF UNCERTAIN SIGNIFICANCE: The growth on your arm might be a type of skin cancer. We will refer you to a skin specialist to check it out and possibly treat it by freezing it off.  -GENERAL HEALTH MAINTENANCE: You are up-to-date with your flu vaccination. We will continue with annual flu shots to keep you protected.  INSTRUCTIONS:  We will schedule a follow-up appointment soon after you return home. Please monitor your blood pressure regularly and keep a record of the readings. Also, ensure you attend the appointments with the lung and skin specialists as referred.

## 2023-03-31 NOTE — Telephone Encounter (Unsigned)
 Copied from CRM [id number]. Topic: Clinical - Lab/Test Results >> Mar 31, 2023 10:16 AM Orinda Kenner C wrote: Reason for CRM: Patient returning call on test results. Please call back [phone].

## 2023-04-06 NOTE — Telephone Encounter (Signed)
 Called patient and informed him of lab results/notes.

## 2023-04-06 NOTE — Telephone Encounter (Signed)
 Also mailed lab results.

## 2023-04-07 ENCOUNTER — Ambulatory Visit: Payer: Self-pay | Admitting: Internal Medicine

## 2023-04-07 ENCOUNTER — Telehealth: Payer: Self-pay | Admitting: Internal Medicine

## 2023-04-07 NOTE — Telephone Encounter (Signed)
 Copied from CRM [id number]. Topic: Clinical - Prescription Issue >> Apr 07, 2023 10:43 AM Eleanor C wrote: Reason for CRM: patient called yesterday about getting a Z pack for his cold called in. I see that it is still pending and I advised patient that turnaround times can be a couple of days. However, since it's already been a day on this one, agent will bump to high priority. Thank you

## 2023-04-07 NOTE — Telephone Encounter (Signed)
 Copied from CRM [id number]. Topic: Clinical - Medication Refill >> Apr 06, 2023  3:48 PM Deidre DASEN wrote: Most Recent Primary Care Visit:  Provider: MORRISON, RYAN G  Department: [facility]  Visit Type: OFFICE VISIT  Date: 03/24/2023  Medication: Z PACK  FOR A COLD   Has the patient contacted their pharmacy? No (Agent: If no, request that the patient contact the pharmacy for the refill. If patient does not wish to contact the pharmacy document the reason why and proceed with request.) (Agent: If yes, when and what did the pharmacy advise?)  Is this the correct pharmacy for this prescription? Yes If no, delete pharmacy and type the correct one.  This is the patient's preferred pharmacy:  [facility] [address], [place] - [address] [place] [address] Phone: [phone] Fax: [phone]   [facility] [id number], [place] - [address] [place] [address] Phone: [phone] Fax: [phone]   Has the prescription been filled recently? No  Is the patient out of the medication? No NEW MEDICATION REQUEST   Has the patient been seen for an appointment in the last year OR does the patient have an upcoming appointment? Yes  Can we respond through MyChart? No  Agent: Please be advised that Rx refills may take up to 3 business days. We ask that you follow-up with your pharmacy.   [phone] IS PATIENT CELL NUMBER  WHEN THE MEDICATION HAS BEEN CALLED IN HE IS IN A ASSISTANT LIVING PLACE AT THE MOMENT BUT WILL BE GOING HOME TOMORROW   Spoke with patient and advised him that per Dr. Jesus he needs to be evaluated first before this medication can be sent in. Patient stated that his cold will probably just pass and not to worry about it.

## 2023-04-07 NOTE — Telephone Encounter (Signed)
 First note has been routed to provider.

## 2023-04-07 NOTE — Telephone Encounter (Signed)
 Requesting zpak  Chief Complaint: cough Symptoms: productive cough Frequency: weeks Pertinent Negatives: Patient denies fever, denies SOB  Disposition: [] ED /[] Urgent Care (no appt availability in office) / [] Appointment(In office/virtual)/ []  [facility]/ [x] Home Care/ [] Refused Recommended Disposition /[] [facility]/ []  Follow-up with PCP  Additional Notes: Worse with eating, worse in morning, uses cpap.  Called yesterday was requesting a zpak, today feels slightly better.   Copied from CRM [id number]. Topic: Clinical - Prescription Issue >> Apr 07, 2023 10:43 AM Eleanor C wrote: Reason for CRM: patient called yesterday about getting a Z pack for his cold called in. I see that it is still pending and I advised patient that turnaround times can be a couple of days. However, since it's already been a day on this one, agent will bump to high priority. Thank you Reason for Disposition  Cough  Answer Assessment - Initial Assessment Questions 1. ONSET: When did the cough begin?      weeks 2. SEVERITY: How bad is the cough today?      Better than yesterday but still bad,  3. SPUTUM: Describe the color of your sputum (none, dry cough; clear, white, yellow, green)     yellow 4. HEMOPTYSIS: Are you coughing up any blood? If so ask: How much? (flecks, streaks, tablespoons, etc.)     denies 5. DIFFICULTY BREATHING: Are you having difficulty breathing? If Yes, ask: How bad is it? (e.g., mild, moderate, severe)    - MILD: No SOB at rest, mild SOB with walking, speaks normally in sentences, can lie down, no retractions, pulse < 100.    - MODERATE: SOB at rest, SOB with minimal exertion and prefers to sit, cannot lie down flat, speaks in phrases, mild retractions, audible wheezing, pulse 100-120.    - SEVERE: Very SOB at rest, speaks in single words, struggling to breathe, sitting hunched forward, retractions, pulse > 120      Worse in morning, uses cpap 6. FEVER:  Do you have a fever? If Yes, ask: What is your temperature, how was it measured, and when did it start?     Ne fever 7. CARDIAC HISTORY: Do you have any history of heart disease? (e.g., heart attack, congestive heart failure)      Denies, but does see cards 8. LUNG HISTORY: Do you have any history of lung disease?  (e.g., pulmonary embolus, asthma, emphysema)     Being sent to pulm for this as well 9. PE RISK FACTORS: Do you have a history of blood clots? (or: recent major surgery, recent prolonged travel, bedridden)     Denies  10. OTHER SYMPTOMS: Do you have any other symptoms? (e.g., runny nose, wheezing, chest pain)      Runny nose 12. TRAVEL: Have you traveled out of the country in the last month? (e.g., travel history, exposures)       denies  Protocols used: Cough - Acute Productive-A-AH

## 2023-04-07 NOTE — Telephone Encounter (Signed)
 I have spoken with patient in regard.  I have advised that we do not typically prescribe medication outside of an in person or virtual evaluation.    I have advised that message has been routed to provider for review.

## 2023-04-07 NOTE — Telephone Encounter (Signed)
 Copied from CRM [id number]. Topic: Clinical - Medication Refill >> Apr 06, 2023  3:48 PM Deidre DASEN wrote: Most Recent Primary Care Visit:  Provider: MORRISON, RYAN G  Department: [facility]  Visit Type: OFFICE VISIT  Date: 03/24/2023  Medication: Z PACK  FOR A COLD   Has the patient contacted their pharmacy? No (Agent: If no, request that the patient contact the pharmacy for the refill. If patient does not wish to contact the pharmacy document the reason why and proceed with request.) (Agent: If yes, when and what did the pharmacy advise?)  Is this the correct pharmacy for this prescription? Yes If no, delete pharmacy and type the correct one.  This is the patient's preferred pharmacy:  [facility] [address] [place] - [address] [place] [address] Phone: [phone] Fax: [phone]   [facility] [id number] [facility], [place] - [address] [place] [address] Phone: [phone] Fax: [phone]   Has the prescription been filled recently? No  Is the patient out of the medication? No NEW MEDICATION REQUEST   Has the patient been seen for an appointment in the last year OR does the patient have an upcoming appointment? Yes  Can we respond through MyChart? No  Agent: Please be advised that Rx refills may take up to 3 business days. We ask that you follow-up with your pharmacy.   [phone] IS PATIENT CELL NUMBER  WHEN THE MEDICATION HAS BEEN CALLED IN HE IS IN A ASSISTANT LIVING PLACE AT THE MOMENT BUT WILL BE GOING HOME TOMORROW    Forwarding this to Dr. Jesus for approval or denial.

## 2023-04-10 NOTE — Telephone Encounter (Signed)
 On 04/07/23: Spoke with patient and advised him that per Dr. Jon Billings he needs to be evaluated first before this medication can be sent in. Patient stated that "his "cold" will probably just pass and not to worry about it."

## 2023-04-14 ENCOUNTER — Telehealth: Payer: Self-pay

## 2023-04-14 NOTE — Telephone Encounter (Signed)
Copied from CRM [id number]. Topic: General - Other >> Apr 14, 2023  2:07 PM Fredrich Romans wrote: Reason for CRM: patient would like to know if Dr Jon Billings could send an order over for PT to [facility] outpatient PT at [facility] He stated that Dr Theodore Demark said that he know what he needs the PT for.  Pt is requesting an order to PT and stated that you know what he will be needing it for.  Please Advise.  Message has been sent to [facility] to address.

## 2023-04-17 ENCOUNTER — Other Ambulatory Visit: Payer: Self-pay

## 2023-04-17 DIAGNOSIS — R531 Weakness: Secondary | ICD-10-CM

## 2023-04-17 DIAGNOSIS — Z993 Dependence on wheelchair: Secondary | ICD-10-CM

## 2023-04-17 DIAGNOSIS — R29898 Other symptoms and signs involving the musculoskeletal system: Secondary | ICD-10-CM

## 2023-04-17 NOTE — Telephone Encounter (Signed)
Placed PT referral for requested facility.

## 2023-04-18 ENCOUNTER — Ambulatory Visit
Admission: RE | Admit: 2023-04-18 | Discharge: 2023-04-18 | Disposition: A | Discharge status: Home / Self Care | Payer: Medicare Other | Source: Ambulatory Visit | Attending: Internal Medicine | Admitting: Internal Medicine

## 2023-04-18 DIAGNOSIS — G9332 Myalgic encephalomyelitis/chronic fatigue syndrome: Secondary | ICD-10-CM

## 2023-04-18 DIAGNOSIS — R5383 Other fatigue: Secondary | ICD-10-CM

## 2023-04-20 ENCOUNTER — Institutional Professional Consult (permissible substitution): Payer: Medicare Other | Admitting: Internal Medicine

## 2023-04-21 ENCOUNTER — Encounter: Payer: Self-pay | Admitting: Internal Medicine

## 2023-04-21 ENCOUNTER — Ambulatory Visit (INDEPENDENT_AMBULATORY_CARE_PROVIDER_SITE_OTHER): Payer: Medicare Other | Admitting: Internal Medicine

## 2023-04-21 VITALS — BP 137/78 | HR 77 | Temp 97.5°F | Ht 68.0 in

## 2023-04-21 DIAGNOSIS — R911 Solitary pulmonary nodule: Secondary | ICD-10-CM

## 2023-04-21 DIAGNOSIS — Z741 Need for assistance with personal care: Secondary | ICD-10-CM | POA: Diagnosis not present

## 2023-04-21 NOTE — Patient Instructions (Addendum)
VISIT SUMMARY:  During your visit, we discussed your recent health concerns, including a persistent cough, mobility issues, and dissatisfaction with your current level of care. We reviewed the findings from your recent chest x-ray and planned further evaluations. We also addressed your mobility challenges and the need for specialized geriatric care.  YOUR PLAN:  -RIGHT UPPER LOBE PULMONARY NODULE: A nodule was found in the upper part of your right lung on a chest x-ray. This could be a benign growth or something more serious. We need to do a CT scan in three months to get a clearer picture and rule out any serious conditions.  -MOBILITY ISSUES: You have significant mobility challenges, partly due to a difference in leg length from a previous injury. We will refer you to physical therapy and an orthopedic specialist to explore options like a built-up shoe. You will also be receiving a motorized wheelchair to help with your mobility.  -GERIATRIC CARE NEEDS: You are not satisfied with our current care system (focused appointments only) and need more specialized, comprehensive geriatric care. We recommend seeing Dr. Chales Abrahams, who specializes in geriatric medicine and rehabilitation, for a more comprehensive approach to your health.  Also, we hope that by seeing our orthopedist, Dr. Steward Drone, some more relief from your severe mobility problems might be achievable.  INSTRUCTIONS:  Please complete the scheduled CT chest scan in three months(they will call). Follow up with physical therapy and the orthopedic specialist as needed. Coordinate your care with Dr. Chales Abrahams for comprehensive geriatric management.

## 2023-04-21 NOTE — Progress Notes (Signed)
==============================  [facility]: [phone]   -- Medical Office Visit --  Patient: Brandon Mendoza.      Age: 83 y.o.       Sex:  male  Date:   04/21/2023 Today's Healthcare Provider: Lula Olszewski, MD  ==============================   CHIEF COMPLAINT: 1 month follow-up, Cough, mobility, and Ear Fullness   SUBJECTIVE: Background This is a 83 y.o. 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); History of left-sided carotid endarterectomy; Essential hypertension; OSA on CPAP; Normocytic anemia; Leukocytosis; AF (paroxysmal atrial fibrillation) (HCC); Chronic anticoagulation; Hyponatremia; Hematoma; Spinal stenosis of lumbar region; S/P lumbar fusion; Ataxia involving legs; Paroxysmal atrial fibrillation (HCC); Loop - Medtronic Linq 10/22/2020; Pain in joint of right knee; Acquired bilateral hammer toes; Acquired complex renal cyst; Acquired thrombophilia (HCC); Benign hypertension with 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); Constipation; 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HCC); Disequilibrium; Fall with injury; Falls; Functional gait abnormality; Gastroesophageal reflux disease without esophagitis; Idiopathic neuropathy; Ingrowing left great toenail; Other intervertebral disc degeneration, lumbar region; Pain in left foot; Pain in left knee; Pain in right foot; History of colonic polyps; Recurrent falls; Renal cyst; Tinea pedis of both feet; Urge incontinence; Urinary urgency; High arches; Hammertoes of both feet; Neuropathy; Generalized anxiety disorder; Primary osteoarthritis involving multiple joints; Claudication of lower extremity (HCC); Atrophy of calf muscles; Weakness generalized; Focal neurological deficit; Kyphosis;  Memory impairment of gradual onset; Lightheadedness; Ataxia; Gait abnormality; Spinal stenosis at L4-L5 level; Claw foot; Acquired clawfoot, left foot; Acquired clawfoot, right foot; Pain in joint of right hip; Lumbar spondylosis; Myofascial pain; Macular degeneration; Unable to care for self; Dehydration; Depression; COPD (chronic obstructive pulmonary disease) (HCC); Quadriceps tendon rupture, right, initial encounter; Quadriceps tendon rupture, right, subsequent encounter; Wound dehiscence; and Sialoadenitis of submandibular gland on their problem list.  History of Present Illness The patient, previously residing in an assisted living facility, presents with a history of mobility issues and recent financial strain due to high care costs. They report dissatisfaction with the level of care received at the facility, citing lack of regular check-ins despite being initially assessed at the highest care level. The patient has since left the facility and is managing independently.  The patient has been experiencing a persistent cough, which prompted a chest x-ray. The x-ray revealed a right upper lobe nodule, and a follow-up CT scan has been scheduled. The patient reports that the cough has since resolved after self-medicating with over-the-counter cold and flu medication, leading to the cancellation of a previously scheduled pulmonary appointment.  The patient also reports a decrease in mobility due to a discrepancy in leg length, believed to be a result of a previous quadriceps tendon tear. They express a desire for a built-up shoe or similar aid to assist with this issue. The patient is also due to receive a motorized wheelchair, which they hope will improve their mobility.  The patient's insurance situation has been a topic of discussion, with the patient expressing confusion over the lack of coverage for assisted living costs despite having a comprehensive plan. The patient is considering a switch to a  geriatrician who may be able to provide more specialized care.   Reviewed chart records that patient  has a past medical history of Acquired clawfoot, right foot (12/16/2021), Anxiety, Atrophy of calf muscles (  10/21/2021), Carotid artery occlusion, CKD (chronic kidney disease), COPD (chronic obstructive pulmonary disease) (HCC), Depression, Dysrhythmia, Focal neurological deficit (10/21/2021), colonic polyps, Hyperlipidemia, Hypertension, Kyphosis (10/21/2021), Loop - Medtronic Linq 10/22/2020 (10/22/2020), Nocturia, Paroxysmal atrial fibrillation (HCC), Peripheral neuropathy, Peripheral vascular disease (HCC), Sleep apnea, Spinal stenosis, Stroke (HCC) (05/01/2011), and Weakness generalized (10/21/2021).  Discussed Past Medical History - Right upper lobe nodule - Cough (resolved) - Persistent walking difficulties - Poor balance - Neuropathy - Torn quadriceps tendon  RADIOLOGY Chest x-ray: Right upper lobe nodule (04/21/2023)  Current Outpatient Medications on File Prior to Visit  Medication Sig   aspirin EC 81 MG tablet Take 81 mg by mouth daily. Swallow whole. Four times a week (Sunday, Monday, Wednesday and Friday).   DULoxetine (CYMBALTA) 30 MG capsule Take 1 capsule (30 mg total) by mouth at bedtime.   flecainide (TAMBOCOR) 100 MG tablet Take 1 tablet (100 mg total) by mouth 2 (two) times daily. (Patient taking differently: Take 50 mg by mouth 2 (two) times daily. Takes half of a 100 mg tablet.)   losartan-hydrochlorothiazide (HYZAAR) 50-12.5 MG tablet TAKE 1 TABLET EVERY MORNING   melatonin 5 MG TABS 1 tablet in the evening Orally Once a day   rosuvastatin (CRESTOR) 10 MG tablet Take 1 tablet (10 mg total) by mouth every evening.   sertraline (ZOLOFT) 50 MG tablet Take 50 mg by mouth daily.   No current facility-administered medications on file prior to visit.  There are no discontinued medications.    Objective   Physical Exam     04/21/2023   10:37 AM 03/24/2023   10:59 AM  03/24/2023   10:38 AM  Vitals with BMI  Height 5\' 8"   5\' 8"   Weight --  192 lbs 10 oz  BMI   29.29  Systolic 137 129 098  Diastolic 78 80 77  Pulse 77  76   Wt Readings from Last 10 Encounters:  03/24/23 192 lb 9.6 oz (87.4 kg)  10/15/22 184 lb 15.5 oz (83.9 kg)  08/30/22 180 lb (81.6 kg)  07/12/22 194 lb 0.1 oz (88 kg)  04/06/22 189 lb 12.8 oz (86.1 kg)  03/01/22 189 lb 9.6 oz (86 kg)  01/04/22 184 lb (83.5 kg)  12/22/21 183 lb 12.8 oz (83.4 kg)  12/14/21 182 lb (82.6 kg)  12/10/21 183 lb (83 kg)   Vital signs reviewed.  Nursing notes reviewed. Weight trend reviewed. Abnormalities and Problem-Specific physical exam findings:  in wheelchair, knee braces  General Appearance:  No acute distress appreciable.   Well-groomed, healthy-appearing male.  Well proportioned with no abnormal fat distribution.  Good muscle tone. Pulmonary:  Normal work of breathing at rest, no respiratory distress apparent. SpO2: 95 %  Musculoskeletal: All extremities are intact.  Neurological:  Awake, alert, oriented, and engaged.  No obvious focal neurological deficits or cognitive impairments.  Sensorium seems unclouded.   Speech is clear and coherent with logical content. Psychiatric:  Appropriate mood, pleasant and cooperative demeanor, thoughtful and engaged during the exam    PROCEDURE: CERUMEN DISIMPACTION   Otoscopic viewing of the tympanic membrane was initially obstructed by copious impacted cerumen in the external auditory canal, so disimpaction by irrigation was recommended.  The associated and risk of tympanic membrane perforation was discussed and verbal consent was obtained prior to performing the procedure. The affected left auditory canal(s) were then irrigated by gentle ear lavage with successful removal of impacted cerumen as confirmed on post procedural repeat otoscopy.     After the procedure,  the patient reported: some relief and improved hearing to MA Marga Melnick and departed prior to  physician having opportunity to re-examine.       No results found for any visits on 04/21/23. Office Visit on 03/24/2023  Component Date Value   Cholesterol 03/24/2023 129    Triglycerides 03/24/2023 72.0    HDL 03/24/2023 57.40    VLDL 03/24/2023 14.4    LDL Cholesterol 03/24/2023 57    Total CHOL/HDL Ratio 03/24/2023 2    NonHDL 03/24/2023 71.43    WBC 03/24/2023 8.7    RBC 03/24/2023 4.81    Hemoglobin 03/24/2023 14.7    HCT 03/24/2023 44.1    MCV 03/24/2023 91.5    MCHC 03/24/2023 33.4    RDW 03/24/2023 14.4    Platelets 03/24/2023 278.0    Neutrophils Relative % 03/24/2023 63.3    Lymphocytes Relative 03/24/2023 13.5    Monocytes Relative 03/24/2023 11.7    Eosinophils Relative 03/24/2023 10.7 (H)    Basophils Relative 03/24/2023 0.8    Neutro Abs 03/24/2023 5.5    Lymphs Abs 03/24/2023 1.2    Monocytes Absolute 03/24/2023 1.0    Eosinophils Absolute 03/24/2023 0.9 (H)    Basophils Absolute 03/24/2023 0.1    Sodium 03/24/2023 138    Potassium 03/24/2023 4.2    Chloride 03/24/2023 102    CO2 03/24/2023 26    Glucose, Bld 03/24/2023 86    BUN 03/24/2023 24 (H)    Creatinine, Ser 03/24/2023 0.94    Total Bilirubin 03/24/2023 0.7    Alkaline Phosphatase 03/24/2023 102    AST 03/24/2023 16    ALT 03/24/2023 12    Total Protein 03/24/2023 6.3    Albumin 03/24/2023 4.0    GFR 03/24/2023 74.82    Calcium 03/24/2023 9.1    Folate 03/24/2023 >24.2    Sed Rate 03/24/2023 23 (H)    TSH 03/24/2023 1.61    Vitamin B-12 03/24/2023 332    Color, Urine 03/24/2023 YELLOW    APPearance 03/24/2023 CLEAR    Specific Gravity, Urine 03/24/2023 1.020    pH 03/24/2023 6.5    Total Protein, Urine 03/24/2023 NEGATIVE    Urine Glucose 03/24/2023 NEGATIVE    Ketones, ur 03/24/2023 NEGATIVE    Bilirubin Urine 03/24/2023 NEGATIVE    Hgb urine dipstick 03/24/2023 NEGATIVE    Urobilinogen, UA 03/24/2023 1.0    Leukocytes,Ua 03/24/2023 NEGATIVE    Nitrite 03/24/2023 NEGATIVE    Appointment on 03/20/2023  Component Date Value   Date Time Interrogation * 03/19/2023 [id number]    Pulse Generator Manufact* 03/19/2023 MERM    Pulse Gen Model 03/19/2023 LNQ22 LINQ II    Pulse Gen Serial Number 03/19/2023 [id number]    Clinic Name 03/19/2023 [facility]    Implantable Pulse Genera* 03/19/2023 ICM/ILR    Implantable Pulse Genera* 03/19/2023 [id number]   Clinical Support on 02/13/2023  Component Date Value   Date Time Interrogation * 02/12/2023 [id number]    Pulse Generator Manufact* 02/12/2023 MERM    Pulse Gen Model 02/12/2023 LNQ22 LINQ II    Pulse Gen Serial Number 02/12/2023 [id number]    Clinic Name 02/12/2023 [facility]    Implantable Pulse Genera* 02/12/2023 ICM/ILR    Implantable Pulse Genera* 02/12/2023 [id number]   Clinical Support on 01/10/2023  Component Date Value   Date Time Interrogation * 01/09/2023 [id number]    Pulse Generator Manufact* 01/09/2023 MERM    Pulse Gen Model 01/09/2023 LNQ22 LINQ II    Pulse Gen Serial Number 01/09/2023 [id number]  Clinic Name 01/09/2023 [facility]    Implantable Pulse Genera* 01/09/2023 ICM/ILR    Implantable Pulse Genera* 01/09/2023 [id number]   Admission on 10/07/2022, Discharged on 10/18/2022  Component Date Value   Sodium 10/07/2022 133 (L)    Potassium 10/07/2022 3.9    Chloride 10/07/2022 103    CO2 10/07/2022 22    Glucose, Bld 10/07/2022 110 (H)    BUN 10/07/2022 22    Creatinine, Ser 10/07/2022 1.02    Calcium 10/07/2022 8.4 (L)    GFR, Estimated 10/07/2022 >60    Anion gap 10/07/2022 8    WBC 10/07/2022 11.5 (H)    RBC 10/07/2022 4.86    Hemoglobin 10/07/2022 14.6    HCT 10/07/2022 43.1    MCV 10/07/2022 88.7    MCH 10/07/2022 30.0    MCHC 10/07/2022 33.9    RDW 10/07/2022 13.0    Platelets 10/07/2022 271    nRBC 10/07/2022 0.0    Neutrophils Relative % 10/07/2022 76    Neutro Abs 10/07/2022 8.8 (H)    Lymphocytes Relative 10/07/2022 7    Lymphs Abs 10/07/2022 0.8     Monocytes Relative 10/07/2022 11    Monocytes Absolute 10/07/2022 1.3 (H)    Eosinophils Relative 10/07/2022 4    Eosinophils Absolute 10/07/2022 0.5    Basophils Relative 10/07/2022 1    Basophils Absolute 10/07/2022 0.1    Immature Granulocytes 10/07/2022 1    Abs Immature Granulocytes 10/07/2022 0.06    Specimen Description 10/07/2022                     Value:WOUND RIGHT KNEE DEHISCENCE Performed at [facility], [address] [place], [place] [address]    Special Requests 10/07/2022                     Value:NONE Performed at [facility], [address] [place], [place] [address]    Gram Stain 10/07/2022                     Value:RARE WBC PRESENT, PREDOMINANTLY PMN NO ORGANISMS SEEN    Culture 10/07/2022                     Value:No growth aerobically or anaerobically. Performed at [facility], [address] [place], [place] [address]    Report Status 10/07/2022 10/12/2022 FINAL   Admission on 08/30/2022, Discharged on 09/05/2022  Component Date Value   WBC 08/31/2022 8.6    RBC 08/31/2022 4.07 (L)    Hemoglobin 08/31/2022 12.5 (L)    HCT 08/31/2022 37.1 (L)    MCV 08/31/2022 91.2    MCH 08/31/2022 30.7    MCHC 08/31/2022 33.7    RDW 08/31/2022 12.4    Platelets 08/31/2022 228    nRBC 08/31/2022 0.0    Sodium 08/31/2022 136    Potassium 08/31/2022 3.9    Chloride 08/31/2022 104    CO2 08/31/2022 23    Glucose, Bld 08/31/2022 159 (H)    BUN 08/31/2022 23    Creatinine, Ser 08/31/2022 1.01    Calcium 08/31/2022 8.6 (L)    GFR, Estimated 08/31/2022 >60    Anion gap 08/31/2022 9    WBC 09/01/2022 9.6    RBC 09/01/2022 4.19 (L)    Hemoglobin 09/01/2022 12.9 (L)    HCT 09/01/2022 38.8 (L)    MCV 09/01/2022 92.6    MCH 09/01/2022 30.8    MCHC  09/01/2022 33.2    RDW 09/01/2022 13.0    Platelets 09/01/2022 257    nRBC 09/01/2022 0.0   Admission on 07/08/2022, Discharged on 07/15/2022  Component Date Value   WBC  07/08/2022 17.6 (H)    RBC 07/08/2022 4.33    Hemoglobin 07/08/2022 13.5    HCT 07/08/2022 39.3    MCV 07/08/2022 90.8    MCH 07/08/2022 31.2    MCHC 07/08/2022 34.4    RDW 07/08/2022 12.9    Platelets 07/08/2022 330    nRBC 07/08/2022 0.0    Neutrophils Relative % 07/08/2022 74    Neutro Abs 07/08/2022 13.2 (H)    Lymphocytes Relative 07/08/2022 11    Lymphs Abs 07/08/2022 1.9    Monocytes Relative 07/08/2022 12    Monocytes Absolute 07/08/2022 2.0 (H)    Eosinophils Relative 07/08/2022 1    Eosinophils Absolute 07/08/2022 0.2    Basophils Relative 07/08/2022 1    Basophils Absolute 07/08/2022 0.1    Immature Granulocytes 07/08/2022 1    Abs Immature Granulocytes 07/08/2022 0.24 (H)    Sodium 07/08/2022 134 (L)    Potassium 07/08/2022 4.1    Chloride 07/08/2022 103    CO2 07/08/2022 22    Glucose, Bld 07/08/2022 119 (H)    BUN 07/08/2022 42 (H)    Creatinine, Ser 07/08/2022 1.52 (H)    Calcium 07/08/2022 8.7 (L)    Total Protein 07/08/2022 7.0    Albumin 07/08/2022 3.9    AST 07/08/2022 20    ALT 07/08/2022 20    Alkaline Phosphatase 07/08/2022 74    Total Bilirubin 07/08/2022 0.7    GFR, Estimated 07/08/2022 45 (L)    Anion gap 07/08/2022 9    Total CK 07/08/2022 139    Color, Urine 07/08/2022 YELLOW    APPearance 07/08/2022 HAZY (A)    Specific Gravity, Urine 07/08/2022 1.017    pH 07/08/2022 5.0    Glucose, UA 07/08/2022 NEGATIVE    Hgb urine dipstick 07/08/2022 MODERATE (A)    Bilirubin Urine 07/08/2022 NEGATIVE    Ketones, ur 07/08/2022 NEGATIVE    Protein, ur 07/08/2022 NEGATIVE    Nitrite 07/08/2022 NEGATIVE    Leukocytes,Ua 07/08/2022 NEGATIVE    RBC / HPF 07/08/2022 >50    WBC, UA 07/08/2022 6-10    Bacteria, UA 07/08/2022 NONE SEEN    Squamous Epithelial / HPF 07/08/2022 0-5    Mucus 07/08/2022 PRESENT    Hyaline Casts, UA 07/08/2022 PRESENT    WBC 07/08/2022 15.9 (H)    RBC 07/08/2022 4.20 (L)    Hemoglobin 07/08/2022 13.0    HCT 07/08/2022 37.8  (L)    MCV 07/08/2022 90.0    MCH 07/08/2022 31.0    MCHC 07/08/2022 34.4    RDW 07/08/2022 13.0    Platelets 07/08/2022 309    nRBC 07/08/2022 0.0    Creatinine, Ser 07/08/2022 1.58 (H)    GFR, Estimated 07/08/2022 43 (L)    Sodium 07/09/2022 134 (L)    Potassium 07/09/2022 3.6    Chloride 07/09/2022 105    CO2 07/09/2022 24    Glucose, Bld 07/09/2022 140 (H)    BUN 07/09/2022 36 (H)    Creatinine, Ser 07/09/2022 1.36 (H)    Calcium 07/09/2022 8.3 (L)    Total Protein 07/09/2022 6.2 (L)    Albumin 07/09/2022 3.4 (L)    AST 07/09/2022 17    ALT 07/09/2022 15    Alkaline Phosphatase 07/09/2022 68    Total Bilirubin 07/09/2022 0.6  GFR, Estimated 07/09/2022 52 (L)    Anion gap 07/09/2022 5    WBC 07/09/2022 11.1 (H)    RBC 07/09/2022 3.99 (L)    Hemoglobin 07/09/2022 12.5 (L)    HCT 07/09/2022 36.7 (L)    MCV 07/09/2022 92.0    MCH 07/09/2022 31.3    MCHC 07/09/2022 34.1    RDW 07/09/2022 13.1    Platelets 07/09/2022 291    nRBC 07/09/2022 0.0    Magnesium 07/09/2022 2.4    Phosphorus 07/09/2022 3.7    WBC 07/10/2022 10.1    RBC 07/10/2022 3.81 (L)    Hemoglobin 07/10/2022 12.0 (L)    HCT 07/10/2022 35.4 (L)    MCV 07/10/2022 92.9    MCH 07/10/2022 31.5    MCHC 07/10/2022 33.9    RDW 07/10/2022 13.2    Platelets 07/10/2022 290    nRBC 07/10/2022 0.0    Sodium 07/10/2022 138    Potassium 07/10/2022 3.9    Chloride 07/10/2022 108    CO2 07/10/2022 24    Glucose, Bld 07/10/2022 108 (H)    BUN 07/10/2022 35 (H)    Creatinine, Ser 07/10/2022 1.07    Calcium 07/10/2022 8.5 (L)    GFR, Estimated 07/10/2022 >60    Anion gap 07/10/2022 6    Sodium 07/11/2022 136    Potassium 07/11/2022 4.4    Chloride 07/11/2022 105    CO2 07/11/2022 24    Glucose, Bld 07/11/2022 106 (H)    BUN 07/11/2022 36 (H)    Creatinine, Ser 07/11/2022 1.10    Calcium 07/11/2022 8.5 (L)    GFR, Estimated 07/11/2022 >60    Anion gap 07/11/2022 7    Sodium 07/12/2022 136    Potassium  07/12/2022 4.0    Chloride 07/12/2022 105    CO2 07/12/2022 25    Glucose, Bld 07/12/2022 105 (H)    BUN 07/12/2022 31 (H)    Creatinine, Ser 07/12/2022 1.09    Calcium 07/12/2022 8.6 (L)    GFR, Estimated 07/12/2022 >60    Anion gap 07/12/2022 6    WBC 07/12/2022 11.5 (H)    RBC 07/12/2022 3.81 (L)    Hemoglobin 07/12/2022 11.8 (L)    HCT 07/12/2022 35.1 (L)    MCV 07/12/2022 92.1    MCH 07/12/2022 31.0    MCHC 07/12/2022 33.6    RDW 07/12/2022 13.2    Platelets 07/12/2022 265    nRBC 07/12/2022 0.0    MRSA, PCR 07/12/2022 NEGATIVE    Staphylococcus aureus 07/12/2022 NEGATIVE    WBC 07/13/2022 8.7    RBC 07/13/2022 3.43 (L)    Hemoglobin 07/13/2022 10.7 (L)    HCT 07/13/2022 31.7 (L)    MCV 07/13/2022 92.4    MCH 07/13/2022 31.2    MCHC 07/13/2022 33.8    RDW 07/13/2022 13.2    Platelets 07/13/2022 248    nRBC 07/13/2022 0.0    Sodium 07/13/2022 137    Potassium 07/13/2022 3.8    Chloride 07/13/2022 109    CO2 07/13/2022 21 (L)    Glucose, Bld 07/13/2022 159 (H)    BUN 07/13/2022 28 (H)    Creatinine, Ser 07/13/2022 0.97    Calcium 07/13/2022 7.5 (L)    GFR, Estimated 07/13/2022 >60    Anion gap 07/13/2022 7    WBC 07/14/2022 18.3 (H)    RBC 07/14/2022 3.74 (L)    Hemoglobin 07/14/2022 11.5 (L)    HCT 07/14/2022 34.1 (L)    MCV 07/14/2022 91.2    MCH  07/14/2022 30.7    MCHC 07/14/2022 33.7    RDW 07/14/2022 13.2    Platelets 07/14/2022 285    nRBC 07/14/2022 0.0   No image results found. DG Chest 2 View Result Date: 04/21/2023 CLINICAL DATA:  Cough and fatigue. EXAM: CHEST - 2 VIEW COMPARISON:  05/05/2022. FINDINGS: Cardiac silhouette is normal in size. No mediastinal or hilar masses. No evidence of adenopathy. Stable loop recorder overlies the left hilum. Oval nodule, right upper lobe. This was present on the prior chest radiograph, but may be increased in size. Remainder of the lungs is clear. No pleural effusion or pneumothorax. Skeletal structures are intact.  IMPRESSION: 1. No acute cardiopulmonary disease. 2. Right upper lobe nodule. Recommend either follow-up chest radiographs, PA and lateral, in 3 months or follow-up chest CT without contrast for further assessment. Electronically Signed   By: Amie Portland M.D.   On: 04/21/2023 08:21   CUP PACEART REMOTE DEVICE CHECK Result Date: 03/20/2023 ILR summary report received. Battery status OK. Normal device function. No new symptom, tachy, brady, or pause episodes. 4 new AF episodes, longet duration 1hr, burden 1.2%, OAC contraindicated per PA report.  AF vs SR/ST with ectopy.  Monthly summary reports and ROV/PRN 1 false tachy event, oversesning of noise LA, CVRS  CUP PACEART REMOTE DEVICE CHECK Result Date: 02/13/2023 ILR summary report received. Battery status OK. Normal device function. No new symptom, brady, or pause episodes. No new AF episodes. 2 Tachy episodes, 1 on 01/24/23 not previously reported to Triage, 5 sec, cannot exclude artifact oversensing, 194 bpm. Monthly summary reports and ROV/PRN. MC, CVRS DG Chest 2 View Result Date: 04/21/2023 CLINICAL DATA:  Cough and fatigue. EXAM: CHEST - 2 VIEW COMPARISON:  05/05/2022. FINDINGS: Cardiac silhouette is normal in size. No mediastinal or hilar masses. No evidence of adenopathy. Stable loop recorder overlies the left hilum. Oval nodule, right upper lobe. This was present on the prior chest radiograph, but may be increased in size. Remainder of the lungs is clear. No pleural effusion or pneumothorax. Skeletal structures are intact. IMPRESSION: 1. No acute cardiopulmonary disease. 2. Right upper lobe nodule. Recommend either follow-up chest radiographs, PA and lateral, in 3 months or follow-up chest CT without contrast for further assessment. Electronically Signed   By: Amie Portland M.D.   On: 04/21/2023 08:21      Assessment & Plan Right upper lobe pulmonary nodule Right Upper Lobe Pulmonary Nodule A right upper lobe nodule was identified on chest  x-ray. Differential diagnosis includes benign etiologies such as granulomas or malignant processes. Given the history of cough and recent improvement with OTC cold and flu medication, the nodule may be related to a prior infection. Further evaluation is necessary to rule out malignancy. A CT scan is recommended in three months for more detailed evaluation due to insurance coverage. Requires assistance with activities of rehabilitation Mobility Issues Significant mobility issues require a motorized wheelchair. There is difficulty with transportation, leading to a canceled pulmonary appointment. A leg length discrepancy may contribute to these issues. Evaluation by physical therapy and an orthopedic specialist is needed for potential built-up shoe. Refer to physical therapy for management of the leg length discrepancy and consult an orthopedic specialist for further evaluation.  Geriatric Care Needs There is dissatisfaction with current primary care and a lack of comprehensive geriatric care. A geriatrician is sought for specialized care. Although finding a geriatrician is challenging, Dr. Chales Abrahams, who has special certification in geriatric medicine and rehabilitation, has been identified. Refer to  Dr. Chales Abrahams for comprehensive management of geriatric issues and coordinate care accordingly. Follow-up Schedule a CT chest in three months. Follow up with physical therapy and the orthopedic specialist as needed. Coordinate care with Dr. Chales Abrahams for comprehensive geriatric management.     Orders Placed During this Encounter:   Orders Placed This Encounter  Procedures   CT Chest Wo Contrast    Standing Status:   Future    Expected Date:   07/20/2023    Expiration Date:   10/19/2023    Preferred imaging location?:   [facility]   Ambulatory referral to Orthopedic Surgery    Referral Priority:   Routine    Referral Type:   Surgical    Referral Reason:   Specialty Services Required    Referred to Provider:    Huel Cote, MD    Requested Specialty:   Orthopedic Surgery    Number of Visits Requested:   1   Ambulatory referral to Geriatrics    Referral Priority:   Routine    Referral Type:   Consultation    Referral Reason:   Specialty Services Required    Requested Specialty:   Geriatric Medicine    Number of Visits Requested:   1   This document was synthesized by artificial intelligence (Abridge) using HIPAA-compliant recording of the clinical interaction;   We discussed the use of AI scribe software for clinical note transcription with the patient, who gave verbal consent to proceed.    Additional Info: This encounter employed state-of-the-art, real-time, collaborative documentation. The patient actively reviewed and assisted in updating their electronic medical record on a shared screen, ensuring transparency and facilitating joint problem-solving for the problem list, overview, and plan. This approach promotes accurate, informed care. The treatment plan was discussed and reviewed in detail, including medication safety, potential side effects, and all patient questions. We confirmed understanding and comfort with the plan. Follow-up instructions were established, including contacting the office for any concerns, returning if symptoms worsen, persist, or new symptoms develop, and precautions for potential emergency department visits.

## 2023-04-24 ENCOUNTER — Ambulatory Visit: Payer: Medicare Other

## 2023-04-24 DIAGNOSIS — I480 Paroxysmal atrial fibrillation: Secondary | ICD-10-CM

## 2023-04-24 LAB — CUP PACEART REMOTE DEVICE CHECK
Date Time Interrogation Session: 20250126230857
Implantable Pulse Generator Implant Date: 20220728

## 2023-04-26 NOTE — Progress Notes (Signed)
[facility] Summary Report / Loop Recorder

## 2023-05-01 ENCOUNTER — Ambulatory Visit: Payer: Medicare Other | Attending: Internal Medicine | Admitting: Physical Therapy

## 2023-05-01 ENCOUNTER — Encounter: Payer: Self-pay | Admitting: Physical Therapy

## 2023-05-01 DIAGNOSIS — M25561 Pain in right knee: Secondary | ICD-10-CM | POA: Insufficient documentation

## 2023-05-01 DIAGNOSIS — M6249 Contracture of muscle, multiple sites: Secondary | ICD-10-CM | POA: Insufficient documentation

## 2023-05-01 DIAGNOSIS — M6281 Muscle weakness (generalized): Secondary | ICD-10-CM | POA: Insufficient documentation

## 2023-05-01 DIAGNOSIS — R296 Repeated falls: Secondary | ICD-10-CM | POA: Diagnosis present

## 2023-05-01 DIAGNOSIS — M5459 Other low back pain: Secondary | ICD-10-CM | POA: Insufficient documentation

## 2023-05-01 DIAGNOSIS — R2689 Other abnormalities of gait and mobility: Secondary | ICD-10-CM | POA: Diagnosis present

## 2023-05-01 DIAGNOSIS — R29898 Other symptoms and signs involving the musculoskeletal system: Secondary | ICD-10-CM | POA: Diagnosis not present

## 2023-05-01 NOTE — Therapy (Signed)
OUTPATIENT PHYSICAL THERAPY LE EVALUATION   Patient Name: Brandon Mendoza. MRN: [id number] DOB:03-May-1940, 83 y.o., male Today's Date: 05/01/2023  END OF SESSION:  PT End of Session - 05/01/23 0852     Visit Number 1    Date for PT Re-Evaluation 07/29/23    Authorization Type Medicare    PT Start Time 0844    PT Stop Time 0935    PT Time Calculation (min) 51 min    Activity Tolerance Patient tolerated treatment well    Behavior During Therapy [facility] for tasks assessed/performed             Past Medical History:  Diagnosis Date   Acquired clawfoot, right foot 12/16/2021   Anxiety    Atrophy of calf muscles 10/21/2021   Duplex calves was negative for significant blockage EMG was done at [facility] MRI lumbar spine was also done at [facility]   Carotid artery occlusion    CKD (chronic kidney disease)    Stage 3   COPD (chronic obstructive pulmonary disease) (HCC)    Depression    Dysrhythmia    PAF, atrial tachycardia   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   The spinal cord appears normal. 2.   No spinal stenosis. 3.   Mild multilevel degenerative changes as detailed above that do not lead to spinal stenosis or nerve root compression. 4.   T2 hyperintense foci within the pons consistent with chronic microvascular ischemic changes.   Loop - Medtronic Linq 10/22/2020 10/22/2020   Nocturia    Paroxysmal atrial fibrillation (HCC)    Peripheral neuropathy    Peripheral vascular disease (HCC)    Sleep apnea    on 6 cm, nasal pillow   Spinal stenosis    Stroke (HCC) 05/01/2011   ischemic   Weakness generalized 10/21/2021   Severe, legs and arms   Past Surgical History:  Procedure Laterality Date   CAROTID ENDARTERECTOMY  12/09/11   Left cea    ENDARTERECTOMY  12/09/2011   Procedure: ENDARTERECTOMY CAROTID;  Surgeon: Larina Earthly, MD;  Location: [facility] OR;  Service: Vascular;  Laterality: Left;   EYE SURGERY     rt retina detachment   HAMMER TOE SURGERY  2004   HERNIA REPAIR  2006   I & D EXTREMITY Right 10/07/2022   Procedure: IRRIGATION AND DEBRIDEMENT EXTREMITY WITH WOUND CLOSURE;  Surgeon: Venita Lick, MD;  Location: [facility] ORS;  Service: Orthopedics;  Laterality: Right;   QUADRICEPS TENDON REPAIR Right 07/12/2022   Procedure: REPAIR QUADRICEP TENDON;  Surgeon: Durene Romans, MD;  Location: [facility] OR[facility];  Service: Orthopedics;  Laterality: Right;   QUADRICEPS TENDON REPAIR Right 08/30/2022   Procedure: REPAIR QUADRICEP TENDON;  Surgeon: Durene Romans, MD;  Location: [facility];  Service: Orthopedics;  Laterality: Right;   SHOULDER ARTHROSCOPY W/ ROTATOR CUFF REPAIR  2010   TONSILLECTOMY     Patient Active Problem List   Diagnosis Date Noted   Wound dehiscence 10/07/2022   Quadriceps tendon rupture, right, subsequent encounter 08/30/2022   Quadriceps tendon rupture, right, initial encounter 07/12/2022   Unable to care for self 07/08/2022   Dehydration 07/08/2022   Depression 07/08/2022   COPD (chronic obstructive pulmonary disease) (HCC)  07/08/2022   Sialoadenitis of submandibular gland 04/28/2022   Macular degeneration 03/01/2022   Myofascial pain 02/10/2022   Lumbar spondylosis 01/03/2022   Acquired clawfoot, left foot 12/16/2021   Acquired clawfoot, right foot 12/16/2021   Claw foot 12/14/2021   Gait abnormality 12/10/2021   Spinal stenosis at L4-L5 level 12/10/2021   Ataxia 11/22/2021   Lightheadedness 11/02/2021   Claudication of lower extremity (HCC) 10/21/2021   Atrophy of calf muscles 10/21/2021   Weakness generalized 10/21/2021   Focal neurological deficit 10/21/2021   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   Pain in joint of right hip 09/14/2021   High arches 07/22/2021   Hammertoes of both  feet 07/22/2021   Neuropathy 07/22/2021   Constipation 05/28/2021   Disequilibrium 05/28/2021   History of colonic polyps 05/28/2021   Renal cyst 05/28/2021   Gastroesophageal reflux disease without esophagitis 05/05/2021   Ingrowing left great toenail 02/15/2021   S/P lumbar fusion 12/24/2020   Spinal stenosis of lumbar region 12/04/2020   Acquired complex renal cyst 11/23/2020   Functional gait abnormality 11/04/2020   Idiopathic neuropathy 11/02/2020   Tinea pedis of both feet 10/28/2020   Loop - Medtronic Linq 10/22/2020 10/22/2020   Benign hypertension with 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) 10/05/2020   Fall with injury 10/05/2020   Hematoma 09/16/2020   Normocytic anemia 09/14/2020   Leukocytosis 09/14/2020   AF (paroxysmal atrial fibrillation) (HCC) 09/14/2020   Chronic anticoagulation 09/14/2020   Hyponatremia 09/14/2020   Pain in left foot 01/22/2020   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   Pain in joint of right knee 12/27/2019   Recurrent falls 11/17/2019   Paroxysmal atrial fibrillation (HCC) 10/22/2019   Pain in right foot 10/14/2019   Falls 10/07/2019   Ataxia involving legs 08/12/2019   OSA on CPAP 06/20/2018   Urge incontinence 04/13/2018   Urinary urgency 04/13/2018   Pain in left knee 03/01/2018   Other intervertebral disc degeneration, lumbar region 06/16/2017   Acquired bilateral hammer toes 10/05/2015   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   Generalized anxiety disorder 05/12/2015   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   History of left-sided carotid endarterectomy 04/10/2014   Essential hypertension 04/10/2014   Occlusion and stenosis of carotid artery without mention of cerebral infarction 12/15/2011   Habitual alcohol use 12/01/2011   Carotid artery stenosis, symptomatic 11/30/2011   Cerebral infarction (HCC) 11/29/2011    Mixed hyperlipidemia     PCP: Glenetta Hew  REFERRING PROVIDER: Glenetta Hew  REFERRING DIAG: weakness, multiple falls  Rationale for Evaluation and Treatment: Rehabilitation  THERAPY DIAG:  Repeated falls  Other abnormalities of gait and mobility  Acute pain of right knee  Muscle weakness (generalized)  ONSET DATE: 10/07/22  SUBJECTIVE:  SUBJECTIVE STATEMENT: II had multiple falls, I ruptured my quadriceps tendon, had it repaired and then ruptured it again.  He then reports another fall and opened the wound up.  He reports that he was in a skilled nursing setting multiple times the last time being August to January 10th.  Now at home with remodeled bathroom.  He does report multiple falls  PERTINENT HISTORY:  Cerebral infarct 10/21/21, lumbar fusion, recurrent falls, has significant neuropathy and this is the reason for most of his falls   PAIN:  Are you having pain? Yes: NPRS scale: 0/10 Pain location: right knee Pain description: ache  Aggravating factors: being on my feet  Relieving factors: movement  PRECAUTIONS: Fall  WEIGHT BEARING RESTRICTIONS: No  FALLS:  Has patient fallen in last 6 months? Yes. Number of falls every few days   LIVING ENVIRONMENT: Lives with: lives with their spouse Lives in: House/apartment Stairs: Yes: Internal: 20 steps; on right going up Has following equipment at home: Dan Humphreys - 4 wheeled, Wheelchair (manual), shower chair, Tour manager, Grab bars, Ramped entry, and trekking pole  OCCUPATION: Retired  PLOF: Independent, prior to a year ago he was driving used trekking poles to walk  PATIENT GOALS: live at home  NEXT MD VISIT:   OBJECTIVE:   DIAGNOSTIC FINDINGS:    LUMBAR SPINE FINDINGS: 1. Wide laminectomy and posterior pedicle screw and rod  fixation at L4-5 without residual or recurrent stenosis. 2. Progressive adjacent level disease at L3-4 with moderate right and mild left subarticular narrowing and moderate foraminal stenosis bilaterally. Progression is predominantly due to facet disease 3. Progressive moderate facet hypertrophy at L1-2 with a progressive rightward disc protrusion resulting in progressive moderate right foraminal stenosis. Asymmetric endplate marrow edema on the right at L1-2 is consistent with progressive disease as well. 4. Otherwise stable degenerative change without other focal stenosis.   THORACIC SPINE FINDINGS:  On sagittal views the vertebral bodies have normal height and alignment.  Hemangioma within T9 vertebral body and slightly within T10 vertebral body.  Anterior kyphosis.  Mild scoliosis convex right centered at T8 level. The spinal cord is normal in size and appearance. The paraspinal soft tissues are unremarkable.     On axial views there is no spinal stenosis or foraminal narrowing.  Limited views of the aorta, kidneys, liver, lungs and paraspinal muscles are notable for multiple large right renal cysts ranging in size from 1.5 to 7 cm.   COGNITION: Overall cognitive status: Within functional limits for tasks assessed     SENSATION: WFL  MUSCLE LENGTH: Hamstrings: mod tightness on both sides  POSTURE: rounded shoulders and forward head  LOWER EXTREMITY ROM:   the right knee has HS, has no quad tendon on the right, unable to lift the leg, left leg WFL's , left hip and right hip and ankles WFL's   LOWER EXTREMITY MMT: 4-/5 for the LE's except for the right knee extension UPPER EXTREMITY MMT:  4-/5 for shoulders and elbows   FUNCTIONAL TESTS:  5XSTS: 83 seconds really has to use his hands and is unsteady with standing Cannot stand on his own without holding onto something due to poor balance   Transfers:  needs set up and MinA for safety, is impulsive and unsafe at  times. GAIT: He is w/c dependent    TODAY'S TREATMENT:  DATE:  05/01/23 Eval   PATIENT EDUCATION:  Education details: POC Person educated: Patient Education method: Explanation Education comprehension: verbalized understanding  HOME EXERCISE PROGRAM: Will prescribe over the next few visits  ASSESSMENT:  CLINICAL IMPRESSION: Patient is a 83 y.o. male who was seen today for physical therapy evaluation and treatment for repeated falls and balance issues, he has had 3 quadriceps tendon ruptures in the past year, his last rupture was not repaired so he has no active right knee extension.  He reports repeated falls, falling every few days. He was in skilled nursing from August to January 10th recently. He tends to be impulsive and unsafe with transfers, he is no longer walking and is w/c dependent.  He will benefit from skilled PT to decrease his risk for falls and address his overall strength, function and safety   OBJECTIVE IMPAIRMENTS: Abnormal gait, cardiopulmonary status limiting activity, decreased activity tolerance, decreased balance, decreased coordination, decreased endurance, decreased mobility, difficulty walking, decreased ROM, decreased strength, improper body mechanics, postural dysfunction, and poor safety .   REHAB POTENTIAL: Fair dependent on carry over and patient compliance  CLINICAL DECISION MAKING: Evolving/moderate complexity  EVALUATION COMPLEXITY: Moderate  GOALS: Goals reviewed with patient? Yes  SHORT TERM GOALS: Target date: 05/29/23  Patient will be independent with initial HEP. Goal status: INITIAL  LONG TERM GOALS: Target date: 07/29/23  Patient will be independent with advanced/ongoing HEP to improve outcomes and carryover.  Goal status: INITIAL  2.  Understand the need for safety with his transfers and all the steps to be as  safe as possible Goal status: INITIAL  3.  Patient will demonstrate improved functional LE strength as demonstrated by < 25s on 5xSTS. Baseline: 83 seconds had to use hands and was unable to be independent with the standing Goal status: INITIAL  4. Increase UE strength to 4+/5 for better transfers and function Goal status: INITIAL  5.  Patient will report no falls in a 4 weeks period. Baseline: falls every few days Goal status: INITIAL  PLAN:  PT FREQUENCY: 2x/week  PT DURATION: 12 weeks  PLANNED INTERVENTIONS: Therapeutic exercises, Therapeutic activity, Neuromuscular re-education, Balance training, Gait training, Patient/Family education, Self Care, Joint mobilization, Stair training, Cryotherapy, Moist heat, and Manual therapy.  PLAN FOR NEXT SESSION: needs an HEP, needs total body strength to help with his safety in transfers and independence   Jearld Lesch, PT 05/01/2023, 8:54 AM

## 2023-05-04 ENCOUNTER — Other Ambulatory Visit: Payer: Self-pay | Admitting: Internal Medicine

## 2023-05-04 ENCOUNTER — Ambulatory Visit: Payer: Medicare Other | Admitting: Physical Therapy

## 2023-05-04 ENCOUNTER — Encounter: Payer: Self-pay | Admitting: Physical Therapy

## 2023-05-04 DIAGNOSIS — R2689 Other abnormalities of gait and mobility: Secondary | ICD-10-CM

## 2023-05-04 DIAGNOSIS — R296 Repeated falls: Secondary | ICD-10-CM | POA: Diagnosis not present

## 2023-05-04 DIAGNOSIS — M25561 Pain in right knee: Secondary | ICD-10-CM

## 2023-05-04 DIAGNOSIS — M5459 Other low back pain: Secondary | ICD-10-CM

## 2023-05-04 DIAGNOSIS — M6281 Muscle weakness (generalized): Secondary | ICD-10-CM

## 2023-05-04 NOTE — Therapy (Signed)
 OUTPATIENT PHYSICAL THERAPY LE EVALUATION   Patient Name: Brandon Mendoza. MRN: [id number] DOB:10/26/1940, 83 y.o., male Today's Date: 05/04/2023  END OF SESSION:  PT End of Session - 05/04/23 1015     Visit Number 2    Date for PT Re-Evaluation 07/29/23    PT Start Time 1015    PT Stop Time 1100    PT Time Calculation (min) 45 min    Activity Tolerance Patient tolerated treatment well    Behavior During Therapy WFL for tasks assessed/performed             Past Medical History:  Diagnosis Date   Acquired clawfoot, right foot 12/16/2021   Anxiety    Atrophy of calf muscles 10/21/2021   Duplex calves was negative for significant blockage EMG was done at [facility] MRI lumbar spine was also done at [facility]   Carotid artery occlusion    CKD (chronic kidney disease)    Stage 3   COPD (chronic obstructive pulmonary disease) (HCC)    Depression    Dysrhythmia    PAF, atrial tachycardia   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   The spinal cord appears normal. 2.   No spinal stenosis. 3.   Mild multilevel degenerative changes as detailed above that do not lead to spinal stenosis or nerve root compression. 4.   T2 hyperintense foci within the pons consistent with chronic microvascular ischemic changes.   Loop - Medtronic Linq 10/22/2020 10/22/2020   Nocturia    Paroxysmal atrial fibrillation (HCC)    Peripheral neuropathy    Peripheral vascular disease (HCC)    Sleep apnea    on 6 cm, nasal pillow   Spinal stenosis    Stroke (HCC) 05/01/2011   ischemic   Weakness generalized 10/21/2021   Severe, legs and arms   Past Surgical History:  Procedure Laterality Date   CAROTID ENDARTERECTOMY  12/09/11   Left cea   ENDARTERECTOMY  12/09/2011    Procedure: ENDARTERECTOMY CAROTID;  Surgeon: Krystal JULIANNA Doing, MD;  Location: [facility];  Service: Vascular;  Laterality: Left;   EYE SURGERY     rt retina detachment   HAMMER TOE SURGERY  2004   HERNIA REPAIR  2006   I & D EXTREMITY Right 10/07/2022   Procedure: IRRIGATION AND DEBRIDEMENT EXTREMITY WITH WOUND CLOSURE;  Surgeon: Burnetta Aures, MD;  Location: [facility];  Service: Orthopedics;  Laterality: Right;   QUADRICEPS TENDON REPAIR Right 07/12/2022   Procedure: REPAIR QUADRICEP TENDON;  Surgeon: Ernie Cough, MD;  Location: [facility];  Service: Orthopedics;  Laterality: Right;   QUADRICEPS TENDON REPAIR Right 08/30/2022   Procedure: REPAIR QUADRICEP TENDON;  Surgeon: Ernie Cough, MD;  Location: [facility];  Service: Orthopedics;  Laterality: Right;   SHOULDER ARTHROSCOPY W/ ROTATOR CUFF REPAIR  2010   TONSILLECTOMY     Patient Active Problem List   Diagnosis Date Noted   Wound dehiscence 10/07/2022   Quadriceps tendon rupture, right, subsequent encounter 08/30/2022   Quadriceps tendon rupture, right, initial encounter 07/12/2022   Unable to care for self 07/08/2022   Dehydration 07/08/2022   Depression 07/08/2022   COPD (chronic obstructive pulmonary disease) (HCC) 07/08/2022   Sialoadenitis of submandibular  gland 04/28/2022   Macular degeneration 03/01/2022   Myofascial pain 02/10/2022   Lumbar spondylosis 01/03/2022   Acquired clawfoot, left foot 12/16/2021   Acquired clawfoot, right foot 12/16/2021   Claw foot 12/14/2021   Gait abnormality 12/10/2021   Spinal stenosis at L4-L5 level 12/10/2021   Ataxia 11/22/2021   Lightheadedness 11/02/2021   Claudication of lower extremity (HCC) 10/21/2021   Atrophy of calf muscles 10/21/2021   Weakness generalized 10/21/2021   Focal neurological deficit 10/21/2021   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   Pain in joint of right hip 09/14/2021   High arches 07/22/2021   Hammertoes of both feet 07/22/2021   Neuropathy  07/22/2021   Constipation 05/28/2021   Disequilibrium 05/28/2021   History of colonic polyps 05/28/2021   Renal cyst 05/28/2021   Gastroesophageal reflux disease without esophagitis 05/05/2021   Ingrowing left great toenail 02/15/2021   S/P lumbar fusion 12/24/2020   Spinal stenosis of lumbar region 12/04/2020   Acquired complex renal cyst 11/23/2020   Functional gait abnormality 11/04/2020   Idiopathic neuropathy 11/02/2020   Tinea pedis of both feet 10/28/2020   Loop - Medtronic Linq 10/22/2020 10/22/2020   Benign hypertension with 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) 10/05/2020   Fall with injury 10/05/2020   Hematoma 09/16/2020   Normocytic anemia 09/14/2020   Leukocytosis 09/14/2020   AF (paroxysmal atrial fibrillation) (HCC) 09/14/2020   Chronic anticoagulation 09/14/2020   Hyponatremia 09/14/2020   Pain in left foot 01/22/2020   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   Pain in joint of right knee 12/27/2019   Recurrent falls 11/17/2019   Paroxysmal atrial fibrillation (HCC) 10/22/2019   Pain in right foot 10/14/2019   Falls 10/07/2019   Ataxia involving legs 08/12/2019   OSA on CPAP 06/20/2018   Urge incontinence 04/13/2018   Urinary urgency 04/13/2018   Pain in left knee 03/01/2018   Other intervertebral disc degeneration, lumbar region 06/16/2017   Acquired bilateral hammer toes 10/05/2015   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   Generalized anxiety disorder 05/12/2015   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   History of left-sided carotid endarterectomy 04/10/2014   Essential hypertension 04/10/2014   Occlusion and stenosis of carotid artery without mention of cerebral infarction 12/15/2011   Habitual alcohol use 12/01/2011   Carotid artery stenosis, symptomatic 11/30/2011   Cerebral infarction (HCC) 11/29/2011   Mixed hyperlipidemia     PCP:  Bernardino Cone  REFERRING PROVIDER: Bernardino Cone  REFERRING DIAG: weakness, multiple falls  Rationale for Evaluation and Treatment: Rehabilitation  THERAPY DIAG:  Repeated falls  Other abnormalities of gait and mobility  Acute pain of right knee  Muscle weakness (generalized)  Other low back pain  ONSET DATE: 10/07/22  SUBJECTIVE:  SUBJECTIVE STATEMENT: Feeling ok today  PERTINENT HISTORY:  Cerebral infarct 10/21/21, lumbar fusion, recurrent falls, has significant neuropathy and this is the reason for most of his falls   PAIN:  Are you having pain? Yes: NPRS scale: 0/10 Pain location: right knee Pain description: ache  Aggravating factors: being on my feet  Relieving factors: movement  PRECAUTIONS: Fall  WEIGHT BEARING RESTRICTIONS: No  FALLS:  Has patient fallen in last 6 months? Yes. Number of falls every few days   LIVING ENVIRONMENT: Lives with: lives with their spouse Lives in: House/apartment Stairs: Yes: Internal: 20 steps; on right going up Has following equipment at home: [name] - 4 wheeled, Wheelchair (manual), shower chair, Tour manager, Grab bars, Ramped entry, and trekking pole  OCCUPATION: Retired  PLOF: Independent, prior to a year ago he was driving used trekking poles to walk  PATIENT GOALS: live at home  NEXT MD VISIT:   OBJECTIVE:   DIAGNOSTIC FINDINGS:    LUMBAR SPINE FINDINGS: 1. Wide laminectomy and posterior pedicle screw and rod fixation at L4-5 without residual or recurrent stenosis. 2. Progressive adjacent level disease at L3-4 with moderate right and mild left subarticular narrowing and moderate foraminal stenosis bilaterally. Progression is predominantly due to facet disease 3. Progressive moderate facet hypertrophy at L1-2 with a  progressive rightward disc protrusion resulting in progressive moderate right foraminal stenosis. Asymmetric endplate marrow edema on the right at L1-2 is consistent with progressive disease as well. 4. Otherwise stable degenerative change without other focal stenosis.   THORACIC SPINE FINDINGS:  On sagittal views the vertebral bodies have normal height and alignment.  Hemangioma within T9 vertebral body and slightly within T10 vertebral body.  Anterior kyphosis.  Mild scoliosis convex right centered at T8 level. The spinal cord is normal in size and appearance. The paraspinal soft tissues are unremarkable.     On axial views there is no spinal stenosis or foraminal narrowing.  Limited views of the aorta, kidneys, liver, lungs and paraspinal muscles are notable for multiple large right renal cysts ranging in size from 1.5 to 7 cm.   COGNITION: Overall cognitive status: Within functional limits for tasks assessed     SENSATION: WFL  MUSCLE LENGTH: Hamstrings: mod tightness on both sides  POSTURE: rounded shoulders and forward head  LOWER EXTREMITY ROM:   the right knee has HS, has no quad tendon on the right, unable to lift the leg, left leg WFL's , left hip and right hip and ankles WFL's   LOWER EXTREMITY MMT: 4-/5 for the LE's except for the right knee extension UPPER EXTREMITY MMT:  4-/5 for shoulders and elbows   FUNCTIONAL TESTS:  5XSTS: 83 seconds really has to use his hands and is unsteady with standing Cannot stand on his own without holding onto something due to poor balance   Transfers:  needs set up and MinA for safety, is impulsive and unsafe at times. GAIT: He is w/c dependent    TODAY'S TREATMENT:  DATE:  05/04/23 NuStep L6 x 10 min Horiz shoulder abd green 2x15 Hip add ball squeeze 2x15 Hip abd green x10, blue x 10  LAQ LLE 3lb  2x15 Seated March 3lb x10, x15 each HS curls blue 2x15 Seated OHP blue ball 2x10   05/01/23 Eval   PATIENT EDUCATION:  Education details: POC Person educated: Patient Education method: Explanation Education comprehension: verbalized understanding  HOME EXERCISE PROGRAM: Will prescribe over the next few visits  ASSESSMENT:  CLINICAL IMPRESSION: Patient is a 83 y.o. male who was seen today for physical therapy evaluation and treatment for repeated falls and balance issues, he has had 3 quadriceps tendon ruptures in the past year, his last rupture was not repaired so he has no active right knee extension.  He reports repeated falls, falling every few days. Pt I very impulsive and unsafe with transfers, he is no longer walking and is w/c dependent. All interventions completed in WE. LE burning with LAQ. Cues needed for pacing throughout not to complete interventions too fast.  He will benefit from skilled PT to decrease his risk for falls and address his overall strength, function and safety   OBJECTIVE IMPAIRMENTS: Abnormal gait, cardiopulmonary status limiting activity, decreased activity tolerance, decreased balance, decreased coordination, decreased endurance, decreased mobility, difficulty walking, decreased ROM, decreased strength, improper body mechanics, postural dysfunction, and poor safety .   REHAB POTENTIAL: Fair dependent on carry over and patient compliance  CLINICAL DECISION MAKING: Evolving/moderate complexity  EVALUATION COMPLEXITY: Moderate  GOALS: Goals reviewed with patient? Yes  SHORT TERM GOALS: Target date: 05/29/23  Patient will be independent with initial HEP. Goal status: INITIAL  LONG TERM GOALS: Target date: 07/29/23  Patient will be independent with advanced/ongoing HEP to improve outcomes and carryover.  Goal status: INITIAL  2.  Understand the need for safety with his transfers and all the steps to be as safe as possible Goal status: INITIAL  3.   Patient will demonstrate improved functional LE strength as demonstrated by < 25s on 5xSTS. Baseline: 83 seconds had to use hands and was unable to be independent with the standing Goal status: INITIAL  4. Increase UE strength to 4+/5 for better transfers and function Goal status: INITIAL  5.  Patient will report no falls in a 4 weeks period. Baseline: falls every few days Goal status: INITIAL  PLAN:  PT FREQUENCY: 2x/week  PT DURATION: 12 weeks  PLANNED INTERVENTIONS: Therapeutic exercises, Therapeutic activity, Neuromuscular re-education, Balance training, Gait training, Patient/Family education, Self Care, Joint mobilization, Stair training, Cryotherapy, Moist heat, and Manual therapy.  PLAN FOR NEXT SESSION: needs an HEP, needs total body strength to help with his safety in transfers and independence   Tanda KANDICE Sorrow, PTA 05/04/2023, 10:15 AM

## 2023-05-08 ENCOUNTER — Ambulatory Visit
Admission: RE | Admit: 2023-05-08 | Discharge: 2023-05-08 | Disposition: A | Discharge status: Home / Self Care | Payer: Medicare Other | Source: Ambulatory Visit | Attending: Internal Medicine | Admitting: Internal Medicine

## 2023-05-08 DIAGNOSIS — R911 Solitary pulmonary nodule: Secondary | ICD-10-CM

## 2023-05-09 ENCOUNTER — Ambulatory Visit: Payer: Medicare Other | Admitting: Physical Therapy

## 2023-05-09 ENCOUNTER — Encounter: Payer: Self-pay | Admitting: Internal Medicine

## 2023-05-09 DIAGNOSIS — I2510 Atherosclerotic heart disease of native coronary artery without angina pectoris: Secondary | ICD-10-CM

## 2023-05-09 NOTE — Progress Notes (Signed)
  Reviewed CT chest (05/09/2023). No concerning pulmonary nodules. Shows stable mild emphysema. Right renal cyst has increased in size to 9 x 6.5 cm (Bosniak IIF), requiring continued surveillance. Detailed explanation sent via Patient Message. Follow-up imaging recommended in 6 months.

## 2023-05-10 ENCOUNTER — Ambulatory Visit: Payer: Medicare Other | Admitting: Physical Therapy

## 2023-05-10 ENCOUNTER — Encounter: Payer: Self-pay | Admitting: Physical Therapy

## 2023-05-10 DIAGNOSIS — M6281 Muscle weakness (generalized): Secondary | ICD-10-CM

## 2023-05-10 DIAGNOSIS — R296 Repeated falls: Secondary | ICD-10-CM

## 2023-05-10 DIAGNOSIS — M5459 Other low back pain: Secondary | ICD-10-CM

## 2023-05-10 DIAGNOSIS — R2689 Other abnormalities of gait and mobility: Secondary | ICD-10-CM

## 2023-05-10 DIAGNOSIS — M25561 Pain in right knee: Secondary | ICD-10-CM

## 2023-05-10 NOTE — Therapy (Signed)
OUTPATIENT PHYSICAL THERAPY LE EVALUATION   Patient Name: Brandon Mendoza. MRN: [id number] DOB:[birth year], 83 y.o., male Today's Date: 05/10/2023  END OF SESSION:  PT End of Session - 05/10/23 0844     Visit Number 3    Date for PT Re-Evaluation 07/29/23    Authorization Type Medicare    PT Start Time [phone]    PT Stop Time 0930    PT Time Calculation (min) 48 min    Activity Tolerance Patient tolerated treatment well    Behavior During Therapy [facility] for tasks assessed/performed             Past Medical History:  Diagnosis Date   Acquired clawfoot, right foot 12/16/2021   Anxiety    Atrophy of calf muscles 10/21/2021   Duplex calves was negative for significant blockage EMG was done at [facility] MRI lumbar spine was also done at [facility]   Carotid artery occlusion    CKD (chronic kidney disease)    Stage 3   COPD (chronic obstructive pulmonary disease) (HCC)    Depression    Dysrhythmia    PAF, atrial tachycardia   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   The spinal cord appears normal. 2.   No spinal stenosis. 3.   Mild multilevel degenerative changes as detailed above that do not lead to spinal stenosis or nerve root compression. 4.   T2 hyperintense foci within the pons consistent with chronic microvascular ischemic changes.   Loop - Medtronic Linq 10/22/2020 10/22/2020   Nocturia    Paroxysmal atrial fibrillation (HCC)    Peripheral neuropathy    Peripheral vascular disease (HCC)    Sleep apnea    on 6 cm, nasal pillow   Spinal stenosis    Stroke (HCC) 05/01/2011   ischemic   Weakness generalized 10/21/2021   Severe, legs and arms   Past Surgical History:  Procedure Laterality Date   CAROTID ENDARTERECTOMY  12/09/11   Left cea    ENDARTERECTOMY  12/09/2011   Procedure: ENDARTERECTOMY CAROTID;  Surgeon: Larina Earthly, MD;  Location: [facility] OR;  Service: Vascular;  Laterality: Left;   EYE SURGERY     rt retina detachment   HAMMER TOE SURGERY  2004   HERNIA REPAIR  2006   I & D EXTREMITY Right 10/07/2022   Procedure: IRRIGATION AND DEBRIDEMENT EXTREMITY WITH WOUND CLOSURE;  Surgeon: Venita Lick, MD;  Location: [facility] ORS;  Service: Orthopedics;  Laterality: Right;   QUADRICEPS TENDON REPAIR Right 07/12/2022   Procedure: REPAIR QUADRICEP TENDON;  Surgeon: Durene Romans, MD;  Location: [facility] ORS;  Service: Orthopedics;  Laterality: Right;   QUADRICEPS TENDON REPAIR Right 08/30/2022   Procedure: REPAIR QUADRICEP TENDON;  Surgeon: Durene Romans, MD;  Location: [facility] OR[facility];  Service: Orthopedics;  Laterality: Right;   SHOULDER ARTHROSCOPY W/ ROTATOR CUFF REPAIR  2010   TONSILLECTOMY     Patient Active Problem List   Diagnosis Date Noted   Coronary artery calcification 05/09/2023   Rupture of quadriceps tendon 11/03/2022   Wound dehiscence 10/07/2022   Quadriceps tendon rupture, right, subsequent encounter 08/30/2022   Quadriceps tendon rupture, right, initial encounter 07/12/2022   Unable to care for self 07/08/2022   Dehydration  07/08/2022   Depression 07/08/2022   COPD (chronic obstructive pulmonary disease) (HCC) 07/08/2022   Sialoadenitis of submandibular gland 04/28/2022   Macular degeneration 03/01/2022   Myofascial pain 02/10/2022   Lumbar spondylosis 01/03/2022   Acquired clawfoot, left foot 12/16/2021   Acquired clawfoot, right foot 12/16/2021   Claw foot 12/14/2021   Gait abnormality 12/10/2021   Spinal stenosis at L4-L5 level 12/10/2021   Ataxia 11/22/2021   Lightheadedness 11/02/2021   Claudication of lower extremity (HCC) 10/21/2021   Atrophy of calf muscles 10/21/2021   Weakness generalized 10/21/2021   Focal neurological deficit 10/21/2021   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    Pain in joint of right hip 09/14/2021   High arches 07/22/2021   Hammertoes of both feet 07/22/2021   Neuropathy 07/22/2021   Constipation 05/28/2021   Disequilibrium 05/28/2021   History of colonic polyps 05/28/2021   Renal cyst 05/28/2021   Gastroesophageal reflux disease without esophagitis 05/05/2021   Ingrowing left great toenail 02/15/2021   S/P lumbar fusion 12/24/2020   Spinal stenosis of lumbar region 12/04/2020   Acquired complex renal cyst 11/23/2020   Functional gait abnormality 11/04/2020   Idiopathic neuropathy 11/02/2020   Tinea pedis of both feet 10/28/2020   Loop - Medtronic Linq 10/22/2020 10/22/2020   Benign hypertension with 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) 10/05/2020   Fall with injury 10/05/2020   Hematoma 09/16/2020   Normocytic anemia 09/14/2020   Leukocytosis 09/14/2020   AF (paroxysmal atrial fibrillation) (HCC) 09/14/2020   Chronic anticoagulation 09/14/2020   Hyponatremia 09/14/2020   Pain in left foot 01/22/2020   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   Pain in joint of right knee 12/27/2019   Recurrent falls 11/17/2019   Paroxysmal atrial fibrillation (HCC) 10/22/2019   Pain in right foot 10/14/2019   Falls 10/07/2019   Ataxia involving legs 08/12/2019   OSA on CPAP 06/20/2018   Urge incontinence 04/13/2018   Urinary urgency 04/13/2018   Pain in left knee 03/01/2018   Other intervertebral disc degeneration, lumbar region 06/16/2017   Acquired bilateral hammer toes 10/05/2015   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   Generalized anxiety disorder 05/12/2015   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   History of left-sided carotid endarterectomy 04/10/2014   Essential hypertension 04/10/2014   Occlusion and stenosis of carotid artery without mention of cerebral infarction 12/15/2011   Habitual alcohol use 12/01/2011   Carotid  artery stenosis, symptomatic 11/30/2011   Cerebral infarction (HCC) 11/29/2011   Mixed hyperlipidemia     PCP: Glenetta Hew  REFERRING PROVIDER: Glenetta Hew  REFERRING DIAG: weakness, multiple falls  Rationale for Evaluation and Treatment: Rehabilitation  THERAPY DIAG:  Repeated falls  Other abnormalities of gait and mobility  Acute pain of right knee  Muscle weakness (generalized)  Other low back pain  ONSET DATE: 10/07/22  SUBJECTIVE:  SUBJECTIVE STATEMENT: Reports no falls, reports that he is trying to be very safe with his transfers  PERTINENT HISTORY:  Cerebral infarct 10/21/21, lumbar fusion, recurrent falls, has significant neuropathy and this is the reason for most of his falls   PAIN:  Are you having pain? Yes: NPRS scale: 0/10 Pain location: right knee Pain description: ache  Aggravating factors: being on my feet  Relieving factors: movement  PRECAUTIONS: Fall  WEIGHT BEARING RESTRICTIONS: No  FALLS:  Has patient fallen in last 6 months? Yes. Number of falls every few days   LIVING ENVIRONMENT: Lives with: lives with their spouse Lives in: House/apartment Stairs: Yes: Internal: 20 steps; on right going up Has following equipment at home: Dan Humphreys - 4 wheeled, Wheelchair (manual), shower chair, Tour manager, Grab bars, Ramped entry, and trekking pole  OCCUPATION: Retired  PLOF: Independent, prior to a year ago he was driving used trekking poles to walk  PATIENT GOALS: live at home  NEXT MD VISIT:   OBJECTIVE:   DIAGNOSTIC FINDINGS:    LUMBAR SPINE FINDINGS: 1. Wide laminectomy and posterior pedicle screw and rod fixation at L4-5 without residual or recurrent stenosis. 2. Progressive adjacent level disease at L3-4 with moderate right and mild left  subarticular narrowing and moderate foraminal stenosis bilaterally. Progression is predominantly due to facet disease 3. Progressive moderate facet hypertrophy at L1-2 with a progressive rightward disc protrusion resulting in progressive moderate right foraminal stenosis. Asymmetric endplate marrow edema on the right at L1-2 is consistent with progressive disease as well. 4. Otherwise stable degenerative change without other focal stenosis.   THORACIC SPINE FINDINGS:  On sagittal views the vertebral bodies have normal height and alignment.  Hemangioma within T9 vertebral body and slightly within T10 vertebral body.  Anterior kyphosis.  Mild scoliosis convex right centered at T8 level. The spinal cord is normal in size and appearance. The paraspinal soft tissues are unremarkable.     On axial views there is no spinal stenosis or foraminal narrowing.  Limited views of the aorta, kidneys, liver, lungs and paraspinal muscles are notable for multiple large right renal cysts ranging in size from 1.5 to 7 cm.   COGNITION: Overall cognitive status: Within functional limits for tasks assessed     SENSATION: WFL  MUSCLE LENGTH: Hamstrings: mod tightness on both sides  POSTURE: rounded shoulders and forward head  LOWER EXTREMITY ROM:   the right knee has HS, has no quad tendon on the right, unable to lift the leg, left leg WFL's , left hip and right hip and ankles WFL's   LOWER EXTREMITY MMT: 4-/5 for the LE's except for the right knee extension UPPER EXTREMITY MMT:  4-/5 for shoulders and elbows   FUNCTIONAL TESTS:  5XSTS: 83 seconds really has to use his hands and is unsteady with standing Cannot stand on his own without holding onto something due to poor balance   Transfers:  needs set up and MinA for safety, is impulsive and unsafe at times. GAIT: He is w/c dependent    TODAY'S TREATMENT:  DATE:  05/10/23 Nustep level 6 x 10 minutes, 2 minutes without arms 10# straight arm pulls in w/c 3x10 10# chest press in w/c 3x10 10# biceps curls in w/c Leg press 40# 3x10 Leg curls 35# 3x10 Green tband HS curls Green tband ankle all motions Transfers to and from surfaces and machines require CGA to Max A due to safety and the legs buckling  05/04/23 NuStep L6 x 10 min Horiz shoulder abd green 2x15 Hip add ball squeeze 2x15 Hip abd green x10, blue x 10  LAQ LLE 3lb 2x15 Seated March 3lb x10, x15 each HS curls blue 2x15 Seated OHP blue ball 2x10   05/01/23 Eval   PATIENT EDUCATION:  Education details: POC Person educated: Patient Education method: Explanation Education comprehension: verbalized understanding  HOME EXERCISE PROGRAM: Will prescribe over the next few visits  ASSESSMENT:  CLINICAL IMPRESSION: I continued to work with patient on total body strength, conditioning and safety.  I added multiple exercises.  He does not have knee extension on the right so his transfers are truly on one leg and with the arms.  He remains unsafe and impulsive, one instance of transfer today he did not get his feet positioned and I feel like he tried to do the transfer with UE only and did not use the left leg which is good, he required MaxA to not fall.  This is a patient that I feel that if he does not come to PT and is at home would regress quickly and detrimentally for his health.  Patient is a 83 y.o. male who was seen today for physical therapy evaluation and treatment for repeated falls and balance issues, he has had 3 quadriceps tendon ruptures in the past year, his last rupture was not repaired so he has no active right knee extension.  He reports repeated falls, falling every few days. Pt I very impulsive and unsafe with transfers, he is no longer walking and is w/c dependent.   He will benefit from skilled PT to decrease his risk for falls and  address his overall strength, function and safety   OBJECTIVE IMPAIRMENTS: Abnormal gait, cardiopulmonary status limiting activity, decreased activity tolerance, decreased balance, decreased coordination, decreased endurance, decreased mobility, difficulty walking, decreased ROM, decreased strength, improper body mechanics, postural dysfunction, and poor safety .   REHAB POTENTIAL: Fair dependent on carry over and patient compliance  CLINICAL DECISION MAKING: Evolving/moderate complexity  EVALUATION COMPLEXITY: Moderate  GOALS: Goals reviewed with patient? Yes  SHORT TERM GOALS: Target date: 05/29/23  Patient will be independent with initial HEP. Goal status: progressing 05/10/23  LONG TERM GOALS: Target date: 07/29/23  Patient will be independent with advanced/ongoing HEP to improve outcomes and carryover.  Goal status: INITIAL  2.  Understand the need for safety with his transfers and all the steps to be as safe as possible Goal status: INITIAL  3.  Patient will demonstrate improved functional LE strength as demonstrated by < 25s on 5xSTS. Baseline: 83 seconds had to use hands and was unable to be independent with the standing Goal status: INITIAL  4. Increase UE strength to 4+/5 for better transfers and function Goal status: INITIAL  5.  Patient will report no falls in a 4 weeks period. Baseline: falls every few days Goal status: INITIAL  PLAN:  PT FREQUENCY: 2x/week  PT DURATION: 12 weeks  PLANNED INTERVENTIONS: Therapeutic exercises, Therapeutic activity, Neuromuscular re-education, Balance training, Gait training, Patient/Family education, Self Care, Joint mobilization, Stair training, Cryotherapy, Moist heat,  and Manual therapy.  PLAN FOR NEXT SESSION: needs total body strength to help with his safety in transfers and independence.  He does report having a posterior walker and he may bring it in for [place] to practice with , I do worry about the knee and the knee brace he  has, not sure that it fits as it should   Jearld Lesch, PT 05/10/2023, 8:45 AM

## 2023-05-11 ENCOUNTER — Ambulatory Visit: Payer: Medicare Other | Admitting: Physical Therapy

## 2023-05-12 ENCOUNTER — Ambulatory Visit: Payer: Medicare Other | Admitting: Physical Therapy

## 2023-05-12 ENCOUNTER — Ambulatory Visit (HOSPITAL_BASED_OUTPATIENT_CLINIC_OR_DEPARTMENT_OTHER): Payer: Medicare Other | Admitting: Orthopaedic Surgery

## 2023-05-12 DIAGNOSIS — R2689 Other abnormalities of gait and mobility: Secondary | ICD-10-CM

## 2023-05-12 DIAGNOSIS — R296 Repeated falls: Secondary | ICD-10-CM | POA: Diagnosis not present

## 2023-05-12 DIAGNOSIS — M25561 Pain in right knee: Secondary | ICD-10-CM

## 2023-05-12 DIAGNOSIS — M6281 Muscle weakness (generalized): Secondary | ICD-10-CM

## 2023-05-12 NOTE — Therapy (Signed)
OUTPATIENT PHYSICAL THERAPY LE    Patient Name: Brandon Mendoza. MRN: [id number] DOB:1940-04-04, 83 y.o., male Today's Date: 05/12/2023  END OF SESSION:  PT End of Session - 05/12/23 0840     Visit Number 4    Date for PT Re-Evaluation 07/29/23    Authorization Type Medicare    PT Start Time 0840    PT Stop Time 0925    PT Time Calculation (min) 45 min             Past Medical History:  Diagnosis Date   Acquired clawfoot, right foot 12/16/2021   Anxiety    Atrophy of calf muscles 10/21/2021   Duplex calves was negative for significant blockage EMG was done at [facility] MRI lumbar spine was also done at [facility]   Carotid artery occlusion    CKD (chronic kidney disease)    Stage 3   COPD (chronic obstructive pulmonary disease) (HCC)    Depression    Dysrhythmia    PAF, atrial tachycardia   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   The spinal cord appears normal. 2.   No spinal stenosis. 3.   Mild multilevel degenerative changes as detailed above that do not lead to spinal stenosis or nerve root compression. 4.   T2 hyperintense foci within the pons consistent with chronic microvascular ischemic changes.   Loop - Medtronic Linq 10/22/2020 10/22/2020   Nocturia    Paroxysmal atrial fibrillation (HCC)    Peripheral neuropathy    Peripheral vascular disease (HCC)    Sleep apnea    on 6 cm, nasal pillow   Spinal stenosis    Stroke (HCC) 05/01/2011   ischemic   Weakness generalized 10/21/2021   Severe, legs and arms   Past Surgical History:  Procedure Laterality Date   CAROTID ENDARTERECTOMY  12/09/11   Left cea   ENDARTERECTOMY  12/09/2011   Procedure: ENDARTERECTOMY CAROTID;  Surgeon: Larina Earthly, MD;  Location: [facility] OR;  Service:  Vascular;  Laterality: Left;   EYE SURGERY     rt retina detachment   HAMMER TOE SURGERY  2004   HERNIA REPAIR  2006   I & D EXTREMITY Right 10/07/2022   Procedure: IRRIGATION AND DEBRIDEMENT EXTREMITY WITH WOUND CLOSURE;  Surgeon: Venita Lick, MD;  Location: [facility] ORS;  Service: Orthopedics;  Laterality: Right;   QUADRICEPS TENDON REPAIR Right 07/12/2022   Procedure: REPAIR QUADRICEP TENDON;  Surgeon: Durene Romans, MD;  Location: [facility] ORS;  Service: Orthopedics;  Laterality: Right;   QUADRICEPS TENDON REPAIR Right 08/30/2022   Procedure: REPAIR QUADRICEP TENDON;  Surgeon: Durene Romans, MD;  Location: [facility];  Service: Orthopedics;  Laterality: Right;   SHOULDER ARTHROSCOPY W/ ROTATOR CUFF REPAIR  2010   TONSILLECTOMY     Patient Active Problem List   Diagnosis Date Noted   Coronary artery calcification 05/09/2023   Rupture of quadriceps tendon 11/03/2022   Wound dehiscence 10/07/2022   Quadriceps tendon rupture, right, subsequent encounter 08/30/2022   Quadriceps tendon rupture, right, initial encounter 07/12/2022   Unable to care for self 07/08/2022   Dehydration 07/08/2022   Depression 07/08/2022   COPD (chronic obstructive pulmonary disease) (HCC) 07/08/2022   Sialoadenitis of submandibular  gland 04/28/2022   Macular degeneration 03/01/2022   Myofascial pain 02/10/2022   Lumbar spondylosis 01/03/2022   Acquired clawfoot, left foot 12/16/2021   Acquired clawfoot, right foot 12/16/2021   Claw foot 12/14/2021   Gait abnormality 12/10/2021   Spinal stenosis at L4-L5 level 12/10/2021   Ataxia 11/22/2021   Lightheadedness 11/02/2021   Claudication of lower extremity (HCC) 10/21/2021   Atrophy of calf muscles 10/21/2021   Weakness generalized 10/21/2021   Focal neurological deficit 10/21/2021   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   Pain in joint of right hip 09/14/2021   High arches 07/22/2021   Hammertoes of both feet 07/22/2021   Neuropathy  07/22/2021   Constipation 05/28/2021   Disequilibrium 05/28/2021   History of colonic polyps 05/28/2021   Renal cyst 05/28/2021   Gastroesophageal reflux disease without esophagitis 05/05/2021   Ingrowing left great toenail 02/15/2021   S/P lumbar fusion 12/24/2020   Spinal stenosis of lumbar region 12/04/2020   Acquired complex renal cyst 11/23/2020   Functional gait abnormality 11/04/2020   Idiopathic neuropathy 11/02/2020   Tinea pedis of both feet 10/28/2020   Loop - Medtronic Linq 10/22/2020 10/22/2020   Benign hypertension with 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) 10/05/2020   Fall with injury 10/05/2020   Hematoma 09/16/2020   Normocytic anemia 09/14/2020   Leukocytosis 09/14/2020   AF (paroxysmal atrial fibrillation) (HCC) 09/14/2020   Chronic anticoagulation 09/14/2020   Hyponatremia 09/14/2020   Pain in left foot 01/22/2020   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   Pain in joint of right knee 12/27/2019   Recurrent falls 11/17/2019   Paroxysmal atrial fibrillation (HCC) 10/22/2019   Pain in right foot 10/14/2019   Falls 10/07/2019   Ataxia involving legs 08/12/2019   OSA on CPAP 06/20/2018   Urge incontinence 04/13/2018   Urinary urgency 04/13/2018   Pain in left knee 03/01/2018   Other intervertebral disc degeneration, lumbar region 06/16/2017   Acquired bilateral hammer toes 10/05/2015   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   Generalized anxiety disorder 05/12/2015   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   History of left-sided carotid endarterectomy 04/10/2014   Essential hypertension 04/10/2014   Occlusion and stenosis of carotid artery without mention of cerebral infarction 12/15/2011   Habitual alcohol use 12/01/2011   Carotid artery stenosis, symptomatic 11/30/2011   Cerebral infarction (HCC) 11/29/2011   Mixed hyperlipidemia     PCP:  Glenetta Hew  REFERRING PROVIDER: Glenetta Hew  REFERRING DIAG: weakness, multiple falls  Rationale for Evaluation and Treatment: Rehabilitation  THERAPY DIAG:  Repeated falls  Other abnormalities of gait and mobility  Acute pain of right knee  Muscle weakness (generalized)  ONSET DATE: 10/07/22  SUBJECTIVE:  SUBJECTIVE STATEMENT: Reports no falls  PERTINENT HISTORY:  Cerebral infarct 10/21/21, lumbar fusion, recurrent falls, has significant neuropathy and this is the reason for most of his falls   PAIN:  Are you having pain? Yes: NPRS scale: 0/10 Pain location: right knee Pain description: ache  Aggravating factors: being on my feet  Relieving factors: movement  PRECAUTIONS: Fall  WEIGHT BEARING RESTRICTIONS: No  FALLS:  Has patient fallen in last 6 months? Yes. Number of falls every few days   LIVING ENVIRONMENT: Lives with: lives with their spouse Lives in: House/apartment Stairs: Yes: Internal: 20 steps; on right going up Has following equipment at home: Dan Humphreys - 4 wheeled, Wheelchair (manual), shower chair, Tour manager, Grab bars, Ramped entry, and trekking pole  OCCUPATION: Retired  PLOF: Independent, prior to a year ago he was driving used trekking poles to walk  PATIENT GOALS: live at home  NEXT MD VISIT:   OBJECTIVE:   DIAGNOSTIC FINDINGS:    LUMBAR SPINE FINDINGS: 1. Wide laminectomy and posterior pedicle screw and rod fixation at L4-5 without residual or recurrent stenosis. 2. Progressive adjacent level disease at L3-4 with moderate right and mild left subarticular narrowing and moderate foraminal stenosis bilaterally. Progression is predominantly due to facet disease 3. Progressive moderate facet hypertrophy at L1-2 with a progressive rightward disc  protrusion resulting in progressive moderate right foraminal stenosis. Asymmetric endplate marrow edema on the right at L1-2 is consistent with progressive disease as well. 4. Otherwise stable degenerative change without other focal stenosis.   THORACIC SPINE FINDINGS:  On sagittal views the vertebral bodies have normal height and alignment.  Hemangioma within T9 vertebral body and slightly within T10 vertebral body.  Anterior kyphosis.  Mild scoliosis convex right centered at T8 level. The spinal cord is normal in size and appearance. The paraspinal soft tissues are unremarkable.     On axial views there is no spinal stenosis or foraminal narrowing.  Limited views of the aorta, kidneys, liver, lungs and paraspinal muscles are notable for multiple large right renal cysts ranging in size from 1.5 to 7 cm.   COGNITION: Overall cognitive status: Within functional limits for tasks assessed     SENSATION: WFL  MUSCLE LENGTH: Hamstrings: mod tightness on both sides  POSTURE: rounded shoulders and forward head  LOWER EXTREMITY ROM:   the right knee has HS, has no quad tendon on the right, unable to lift the leg, left leg WFL's , left hip and right hip and ankles WFL's   LOWER EXTREMITY MMT: 4-/5 for the LE's except for the right knee extension UPPER EXTREMITY MMT:  4-/5 for shoulders and elbows   FUNCTIONAL TESTS:  5XSTS: 83 seconds really has to use his hands and is unsteady with standing Cannot stand on his own without holding onto something due to poor balance   Transfers:  needs set up and MinA for safety, is impulsive and unsafe at times. GAIT: He is w/c dependent    TODAY'S TREATMENT:  DATE:   05/12/23 Nustep level 6 x 10 minutes, 3.5 minutes without arms STS in // bars 7 x working on full ext of knees esp left cg A Static standing in // bars with wt  shifts CGA and cuing Standing in // bars with PTA supporting LLE and RT LE stepping fwd 10x then laterally 10 x Leg press 40# 3x10- cued for full TKE and slower controlled speed 10# straight arm pulls in w/c 2 x 15 Blue tband chest press in w/c 3x10 then flys 10# biceps curls in w/c 2 sets 15 Leg curls 35# 10x , 45# 2 sets 10- cued for full ROM Seated OH ball press 2 sets 10 - blue ball Seated blue ball core work   05/10/23 Nustep level 6 x 10 minutes, 2 minutes without arms 10# straight arm pulls in w/c 3x10 10# chest press in w/c 3x10 10# biceps curls in w/c Leg press 40# 3x10 Leg curls 35# 3x10 Green tband HS curls Green tband ankle all motions Transfers to and from surfaces and machines require CGA to Max A due to safety and the legs buckling  05/04/23 NuStep L6 x 10 min Horiz shoulder abd green 2x15 Hip add ball squeeze 2x15 Hip abd green x10, blue x 10  LAQ LLE 3lb 2x15 Seated March 3lb x10, x15 each HS curls blue 2x15 Seated OHP blue ball 2x10   05/01/23 Eval   PATIENT EDUCATION:  Education details: POC Person educated: Patient Education method: Explanation Education comprehension: verbalized understanding  HOME EXERCISE PROGRAM: Will prescribe over the next few visits  ASSESSMENT:  CLINICAL IMPRESSION: Continued to work with patient on total body strength, conditioning and safety- cuing needed for speed and control of mvmt. Cuing with transfers as pt continues to be a bit impulsive and unsafe, encouraged to use LLE with transfers as he tries to do more with UE only- incorporated STS to encourage this. CG- mod A with transfers and use of gait belt that pt preferred to not have on.    Patient is a 83 y.o. male who was seen today for physical therapy evaluation and treatment for repeated falls and balance issues, he has had 3 quadriceps tendon ruptures in the past year, his last rupture was not repaired so he has no active right knee extension.  He reports  repeated falls, falling every few days. Pt I very impulsive and unsafe with transfers, he is no longer walking and is w/c dependent.   He will benefit from skilled PT to decrease his risk for falls and address his overall strength, function and safety   OBJECTIVE IMPAIRMENTS: Abnormal gait, cardiopulmonary status limiting activity, decreased activity tolerance, decreased balance, decreased coordination, decreased endurance, decreased mobility, difficulty walking, decreased ROM, decreased strength, improper body mechanics, postural dysfunction, and poor safety .   REHAB POTENTIAL: Fair dependent on carry over and patient compliance  CLINICAL DECISION MAKING: Evolving/moderate complexity  EVALUATION COMPLEXITY: Moderate  GOALS: Goals reviewed with patient? Yes  SHORT TERM GOALS: Target date: 05/29/23  Patient will be independent with initial HEP. Goal status: progressing 05/10/23  LONG TERM GOALS: Target date: 07/29/23  Patient will be independent with advanced/ongoing HEP to improve outcomes and carryover.  Goal status: INITIAL  2.  Understand the need for safety with his transfers and all the steps to be as safe as possible Goal status: INITIAL  3.  Patient will demonstrate improved functional LE strength as demonstrated by < 25s on 5xSTS. Baseline: 83 seconds had to use hands  and was unable to be independent with the standing Goal status: INITIAL  4. Increase UE strength to 4+/5 for better transfers and function Goal status: INITIAL  5.  Patient will report no falls in a 4 weeks period. Baseline: falls every few days Goal status: INITIAL  PLAN:  PT FREQUENCY: 2x/week  PT DURATION: 12 weeks  PLANNED INTERVENTIONS: Therapeutic exercises, Therapeutic activity, Neuromuscular re-education, Balance training, Gait training, Patient/Family education, Self Care, Joint mobilization, Stair training, Cryotherapy, Moist heat, and Manual therapy.  PLAN FOR NEXT SESSION: needs total body  strength to help with his safety in transfers and independence.   Raden Byington,ANGIE, PTA 05/12/2023, 8:41 AM [place] [facility] at [facility] [address] [facility] [place], [place], [address] Phone: [phone]   Fax:  [phone]  Patient Details  Name: Ayomikun Starling. MRN: [id number] Date of Birth: September 26, 1940 Referring Provider:  Lula Olszewski, MD  Encounter Date: 05/12/2023   Suanne Marker, PTA 05/12/2023, 8:41 AM  [facility] at [facility] [address] [facility] [place], [place], [address] Phone: [phone]   Fax:  [phone]

## 2023-05-12 NOTE — Telephone Encounter (Signed)
Patient's review of lab results/notes confirmed. Also, patient saw Dr. Jon Billings for an OV today.

## 2023-05-15 ENCOUNTER — Ambulatory Visit: Payer: Medicare Other | Admitting: Physical Therapy

## 2023-05-15 ENCOUNTER — Encounter: Payer: Self-pay | Admitting: Physical Therapy

## 2023-05-15 DIAGNOSIS — R296 Repeated falls: Secondary | ICD-10-CM | POA: Diagnosis not present

## 2023-05-15 DIAGNOSIS — M25561 Pain in right knee: Secondary | ICD-10-CM

## 2023-05-15 DIAGNOSIS — R2689 Other abnormalities of gait and mobility: Secondary | ICD-10-CM

## 2023-05-15 DIAGNOSIS — M6281 Muscle weakness (generalized): Secondary | ICD-10-CM

## 2023-05-15 NOTE — Therapy (Signed)
OUTPATIENT PHYSICAL THERAPY LE    Patient Name: Brandon Mendoza. MRN: [id number] DOB:Jan 21, 1941, 83 y.o., male Today's Date: 05/15/2023  END OF SESSION:  PT End of Session - 05/15/23 0845     Visit Number 5    Date for PT Re-Evaluation 07/29/23    PT Start Time 0838    PT Stop Time 0923    PT Time Calculation (min) 45 min    Activity Tolerance Patient tolerated treatment well    Behavior During Therapy Impulsive;WFL for tasks assessed/performed             Past Medical History:  Diagnosis Date   Acquired clawfoot, right foot 12/16/2021   Anxiety    Atrophy of calf muscles 10/21/2021   Duplex calves was negative for significant blockage EMG was done at [facility] MRI lumbar spine was also done at [facility]   Carotid artery occlusion    CKD (chronic kidney disease)    Stage 3   COPD (chronic obstructive pulmonary disease) (HCC)    Depression    Dysrhythmia    PAF, atrial tachycardia   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   The spinal cord appears normal. 2.   No spinal stenosis. 3.   Mild multilevel degenerative changes as detailed above that do not lead to spinal stenosis or nerve root compression. 4.   T2 hyperintense foci within the pons consistent with chronic microvascular ischemic changes.   Loop - Medtronic Linq 10/22/2020 10/22/2020   Nocturia    Paroxysmal atrial fibrillation (HCC)    Peripheral neuropathy    Peripheral vascular disease (HCC)    Sleep apnea    on 6 cm, nasal pillow   Spinal stenosis    Stroke (HCC) 05/01/2011   ischemic   Weakness generalized 10/21/2021   Severe, legs and arms   Past Surgical History:  Procedure Laterality Date   CAROTID ENDARTERECTOMY  12/09/11   Left cea   ENDARTERECTOMY  12/09/2011    Procedure: ENDARTERECTOMY CAROTID;  Surgeon: Larina Earthly, MD;  Location: [facility] OR;  Service: Vascular;  Laterality: Left;   EYE SURGERY     rt retina detachment   HAMMER TOE SURGERY  2004   HERNIA REPAIR  2006   I & D EXTREMITY Right 10/07/2022   Procedure: IRRIGATION AND DEBRIDEMENT EXTREMITY WITH WOUND CLOSURE;  Surgeon: Venita Lick, MD;  Location: [facility] ORS;  Service: Orthopedics;  Laterality: Right;   QUADRICEPS TENDON REPAIR Right 07/12/2022   Procedure: REPAIR QUADRICEP TENDON;  Surgeon: Durene Romans, MD;  Location: [facility];  Service: Orthopedics;  Laterality: Right;   QUADRICEPS TENDON REPAIR Right 08/30/2022   Procedure: REPAIR QUADRICEP TENDON;  Surgeon: Durene Romans, MD;  Location: [facility];  Service: Orthopedics;  Laterality: Right;   SHOULDER ARTHROSCOPY W/ ROTATOR CUFF REPAIR  2010   TONSILLECTOMY     Patient Active Problem List   Diagnosis Date Noted   Coronary artery calcification 05/09/2023   Rupture of quadriceps tendon 11/03/2022   Wound dehiscence 10/07/2022   Quadriceps tendon rupture, right, subsequent encounter 08/30/2022   Quadriceps tendon rupture, right, initial encounter 07/12/2022   Unable to care for self 07/08/2022   Dehydration 07/08/2022   Depression 07/08/2022  COPD (chronic obstructive pulmonary disease) (HCC) 07/08/2022   Sialoadenitis of submandibular gland 04/28/2022   Macular degeneration 03/01/2022   Myofascial pain 02/10/2022   Lumbar spondylosis 01/03/2022   Acquired clawfoot, left foot 12/16/2021   Acquired clawfoot, right foot 12/16/2021   Claw foot 12/14/2021   Gait abnormality 12/10/2021   Spinal stenosis at L4-L5 level 12/10/2021   Ataxia 11/22/2021   Lightheadedness 11/02/2021   Claudication of lower extremity (HCC) 10/21/2021   Atrophy of calf muscles 10/21/2021   Weakness generalized 10/21/2021   Focal neurological deficit 10/21/2021   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   Pain in joint of right hip  09/14/2021   High arches 07/22/2021   Hammertoes of both feet 07/22/2021   Neuropathy 07/22/2021   Constipation 05/28/2021   Disequilibrium 05/28/2021   History of colonic polyps 05/28/2021   Renal cyst 05/28/2021   Gastroesophageal reflux disease without esophagitis 05/05/2021   Ingrowing left great toenail 02/15/2021   S/P lumbar fusion 12/24/2020   Spinal stenosis of lumbar region 12/04/2020   Acquired complex renal cyst 11/23/2020   Functional gait abnormality 11/04/2020   Idiopathic neuropathy 11/02/2020   Tinea pedis of both feet 10/28/2020   Loop - Medtronic Linq 10/22/2020 10/22/2020   Benign hypertension with 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) 10/05/2020   Fall with injury 10/05/2020   Hematoma 09/16/2020   Normocytic anemia 09/14/2020   Leukocytosis 09/14/2020   AF (paroxysmal atrial fibrillation) (HCC) 09/14/2020   Chronic anticoagulation 09/14/2020   Hyponatremia 09/14/2020   Pain in left foot 01/22/2020   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   Pain in joint of right knee 12/27/2019   Recurrent falls 11/17/2019   Paroxysmal atrial fibrillation (HCC) 10/22/2019   Pain in right foot 10/14/2019   Falls 10/07/2019   Ataxia involving legs 08/12/2019   OSA on CPAP 06/20/2018   Urge incontinence 04/13/2018   Urinary urgency 04/13/2018   Pain in left knee 03/01/2018   Other intervertebral disc degeneration, lumbar region 06/16/2017   Acquired bilateral hammer toes 10/05/2015   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   Generalized anxiety disorder 05/12/2015   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   History of left-sided carotid endarterectomy 04/10/2014   Essential hypertension 04/10/2014   Occlusion and stenosis of carotid artery without mention of cerebral infarction 12/15/2011   Habitual alcohol use 12/01/2011   Carotid artery stenosis,  symptomatic 11/30/2011   Cerebral infarction (HCC) 11/29/2011   Mixed hyperlipidemia     PCP: Glenetta Hew  REFERRING PROVIDER: Glenetta Hew  REFERRING DIAG: weakness, multiple falls  Rationale for Evaluation and Treatment: Rehabilitation  THERAPY DIAG:  Repeated falls  Other abnormalities of gait and mobility  Acute pain of right knee  Muscle weakness (generalized)  ONSET DATE: 10/07/22  SUBJECTIVE:  SUBJECTIVE STATEMENT: "Fine"  PERTINENT HISTORY:  Cerebral infarct 10/21/21, lumbar fusion, recurrent falls, has significant neuropathy and this is the reason for most of his falls   PAIN:  Are you having pain? Yes: NPRS scale: 0/10 Pain location: right knee Pain description: ache  Aggravating factors: being on my feet  Relieving factors: movement  PRECAUTIONS: Fall  WEIGHT BEARING RESTRICTIONS: No  FALLS:  Has patient fallen in last 6 months? Yes. Number of falls every few days   LIVING ENVIRONMENT: Lives with: lives with their spouse Lives in: House/apartment Stairs: Yes: Internal: 20 steps; on right going up Has following equipment at home: Dan Humphreys - 4 wheeled, Wheelchair (manual), shower chair, Tour manager, Grab bars, Ramped entry, and trekking pole  OCCUPATION: Retired  PLOF: Independent, prior to a year ago he was driving used trekking poles to walk  PATIENT GOALS: live at home  NEXT MD VISIT:   OBJECTIVE:   DIAGNOSTIC FINDINGS:    LUMBAR SPINE FINDINGS: 1. Wide laminectomy and posterior pedicle screw and rod fixation at L4-5 without residual or recurrent stenosis. 2. Progressive adjacent level disease at L3-4 with moderate right and mild left subarticular narrowing and moderate foraminal stenosis bilaterally. Progression is predominantly due to facet disease 3.  Progressive moderate facet hypertrophy at L1-2 with a progressive rightward disc protrusion resulting in progressive moderate right foraminal stenosis. Asymmetric endplate marrow edema on the right at L1-2 is consistent with progressive disease as well. 4. Otherwise stable degenerative change without other focal stenosis.   THORACIC SPINE FINDINGS:  On sagittal views the vertebral bodies have normal height and alignment.  Hemangioma within T9 vertebral body and slightly within T10 vertebral body.  Anterior kyphosis.  Mild scoliosis convex right centered at T8 level. The spinal cord is normal in size and appearance. The paraspinal soft tissues are unremarkable.     On axial views there is no spinal stenosis or foraminal narrowing.  Limited views of the aorta, kidneys, liver, lungs and paraspinal muscles are notable for multiple large right renal cysts ranging in size from 1.5 to 7 cm.   COGNITION: Overall cognitive status: Within functional limits for tasks assessed     SENSATION: WFL  MUSCLE LENGTH: Hamstrings: mod tightness on both sides  POSTURE: rounded shoulders and forward head  LOWER EXTREMITY ROM:   the right knee has HS, has no quad tendon on the right, unable to lift the leg, left leg WFL's , left hip and right hip and ankles WFL's   LOWER EXTREMITY MMT: 4-/5 for the LE's except for the right knee extension UPPER EXTREMITY MMT:  4-/5 for shoulders and elbows   FUNCTIONAL TESTS:  5XSTS: 83 seconds really has to use his hands and is unsteady with standing Cannot stand on his own without holding onto something due to poor balance   Transfers:  needs set up and MinA for safety, is impulsive and unsafe at times. GAIT: He is w/c dependent    TODAY'S TREATMENT:  DATE:  05/15/23 Nustep level 6 x 13 minute, 3 without UE 10# straight arm pulls in w/c  3x10 10# chest press in w/c 3x10 Leg press 40# 3x10 Standing hip Flex And Ext  in RW x10 eaxh PTA blocking RLE to prevent buckling Seated on Mat OHP 12lb WaTE 2x12 Seated Trunk rotations Blue 2x12   05/12/23 Nustep level 6 x 10 minutes, 3.5 minutes without arms STS in // bars 7 x working on full ext of knees esp left cg A Static standing in // bars with wt shifts CGA and cuing Standing in // bars with PTA supporting LLE and RT LE stepping fwd 10x then laterally 10 x Leg press 40# 3x10- cued for full TKE and slower controlled speed 10# straight arm pulls in w/c 2 x 15 Blue tband chest press in w/c 3x10 then flys 10# biceps curls in w/c 2 sets 15 Leg curls 35# 10x , 45# 2 sets 10- cued for full ROM Seated OH ball press 2 sets 10 - blue ball Seated blue ball core work   05/10/23 Nustep level 6 x 10 minutes, 2 minutes without arms 10# straight arm pulls in w/c 3x10 10# chest press in w/c 3x10 10# biceps curls in w/c Leg press 40# 3x10 Leg curls 35# 3x10 Green tband HS curls Green tband ankle all motions Transfers to and from surfaces and machines require CGA to Max A due to safety and the legs buckling  05/04/23 NuStep L6 x 10 min Horiz shoulder abd green 2x15 Hip add ball squeeze 2x15 Hip abd green x10, blue x 10  LAQ LLE 3lb 2x15 Seated March 3lb x10, x15 each HS curls blue 2x15 Seated OHP blue ball 2x10   05/01/23 Eval   PATIENT EDUCATION:  Education details: POC Person educated: Patient Education method: Explanation Education comprehension: verbalized understanding  HOME EXERCISE PROGRAM: Will prescribe over the next few visits  ASSESSMENT:  CLINICAL IMPRESSION: Continued to work with patient on total body strength, conditioning and safety- cuing needed for speed and control of mvmt. Cuing with transfers as pt continues to be a bit impulsive and unsafe. Pt only feel comfortable transferring clockwise. CG- mod A with transfers and use of gait belt that pt preferred  to not have on. No reports of pain during session. UE fatigue reported with chest press.      Patient is a 83 y.o. male who was seen today for physical therapy evaluation and treatment for repeated falls and balance issues, he has had 3 quadriceps tendon ruptures in the past year, his last rupture was not repaired so he has no active right knee extension.  He reports repeated falls, falling every few days. Pt I very impulsive and unsafe with transfers, he is no longer walking and is w/c dependent.   He will benefit from skilled PT to decrease his risk for falls and address his overall strength, function and safety   OBJECTIVE IMPAIRMENTS: Abnormal gait, cardiopulmonary status limiting activity, decreased activity tolerance, decreased balance, decreased coordination, decreased endurance, decreased mobility, difficulty walking, decreased ROM, decreased strength, improper body mechanics, postural dysfunction, and poor safety .   REHAB POTENTIAL: Fair dependent on carry over and patient compliance  CLINICAL DECISION MAKING: Evolving/moderate complexity  EVALUATION COMPLEXITY: Moderate  GOALS: Goals reviewed with patient? Yes  SHORT TERM GOALS: Target date: 05/29/23  Patient will be independent with initial HEP. Goal status: progressing 05/10/23  LONG TERM GOALS: Target date: 07/29/23  Patient will be independent with advanced/ongoing HEP to  improve outcomes and carryover.  Goal status: INITIAL  2.  Understand the need for safety with his transfers and all the steps to be as safe as possible Goal status: INITIAL  3.  Patient will demonstrate improved functional LE strength as demonstrated by < 25s on 5xSTS. Baseline: 83 seconds had to use hands and was unable to be independent with the standing Goal status: INITIAL  4. Increase UE strength to 4+/5 for better transfers and function Goal status: INITIAL  5.  Patient will report no falls in a 4 weeks period. Baseline: falls every few  days Goal status: INITIAL  PLAN:  PT FREQUENCY: 2x/week  PT DURATION: 12 weeks  PLANNED INTERVENTIONS: Therapeutic exercises, Therapeutic activity, Neuromuscular re-education, Balance training, Gait training, Patient/Family education, Self Care, Joint mobilization, Stair training, Cryotherapy, Moist heat, and Manual therapy.  PLAN FOR NEXT SESSION: needs total body strength to help with his safety in transfers and independence.   Grayce Sessions, PTA

## 2023-05-16 ENCOUNTER — Other Ambulatory Visit: Payer: Medicare Other

## 2023-05-17 ENCOUNTER — Ambulatory Visit: Payer: Medicare Other | Admitting: Physical Therapy

## 2023-05-17 ENCOUNTER — Ambulatory Visit (HOSPITAL_BASED_OUTPATIENT_CLINIC_OR_DEPARTMENT_OTHER): Payer: Medicare Other | Admitting: Orthopaedic Surgery

## 2023-05-17 ENCOUNTER — Encounter: Payer: Self-pay | Admitting: Physical Therapy

## 2023-05-17 DIAGNOSIS — M25561 Pain in right knee: Secondary | ICD-10-CM

## 2023-05-17 DIAGNOSIS — R2689 Other abnormalities of gait and mobility: Secondary | ICD-10-CM

## 2023-05-17 DIAGNOSIS — R296 Repeated falls: Secondary | ICD-10-CM

## 2023-05-17 NOTE — Therapy (Signed)
OUTPATIENT PHYSICAL THERAPY LE    Patient Name: Brandon Mendoza. MRN: [id number] DOB:Nov 23, 1940, 83 y.o., male Today's Date: 05/17/2023  END OF SESSION:  PT End of Session - 05/17/23 0847     Visit Number 6    Date for PT Re-Evaluation 07/29/23    PT Start Time 0845    PT Stop Time 0930    PT Time Calculation (min) 45 min    Activity Tolerance Patient tolerated treatment well    Behavior During Therapy Impulsive;WFL for tasks assessed/performed;Lability             Past Medical History:  Diagnosis Date   Acquired clawfoot, right foot 12/16/2021   Anxiety    Atrophy of calf muscles 10/21/2021   Duplex calves was negative for significant blockage EMG was done at [facility] MRI lumbar spine was also done at [facility]   Carotid artery occlusion    CKD (chronic kidney disease)    Stage 3   COPD (chronic obstructive pulmonary disease) (HCC)    Depression    Dysrhythmia    PAF, atrial tachycardia   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   The spinal cord appears normal. 2.   No spinal stenosis. 3.   Mild multilevel degenerative changes as detailed above that do not lead to spinal stenosis or nerve root compression. 4.   T2 hyperintense foci within the pons consistent with chronic microvascular ischemic changes.   Loop - Medtronic Linq 10/22/2020 10/22/2020   Nocturia    Paroxysmal atrial fibrillation (HCC)    Peripheral neuropathy    Peripheral vascular disease (HCC)    Sleep apnea    on 6 cm, nasal pillow   Spinal stenosis    Stroke (HCC) 05/01/2011   ischemic   Weakness generalized 10/21/2021   Severe, legs and arms   Past Surgical History:  Procedure Laterality Date   CAROTID ENDARTERECTOMY  12/09/11   Left cea   ENDARTERECTOMY   12/09/2011   Procedure: ENDARTERECTOMY CAROTID;  Surgeon: Larina Earthly, MD;  Location: [facility] OR;  Service: Vascular;  Laterality: Left;   EYE SURGERY     rt retina detachment   HAMMER TOE SURGERY  2004   HERNIA REPAIR  2006   I & D EXTREMITY Right 10/07/2022   Procedure: IRRIGATION AND DEBRIDEMENT EXTREMITY WITH WOUND CLOSURE;  Surgeon: Venita Lick, MD;  Location: [facility] ORS;  Service: Orthopedics;  Laterality: Right;   QUADRICEPS TENDON REPAIR Right 07/12/2022   Procedure: REPAIR QUADRICEP TENDON;  Surgeon: Durene Romans, MD;  Location: [facility];  Service: Orthopedics;  Laterality: Right;   QUADRICEPS TENDON REPAIR Right 08/30/2022   Procedure: REPAIR QUADRICEP TENDON;  Surgeon: Durene Romans, MD;  Location: [facility];  Service: Orthopedics;  Laterality: Right;   SHOULDER ARTHROSCOPY W/ ROTATOR CUFF REPAIR  2010   TONSILLECTOMY     Patient Active Problem List   Diagnosis Date Noted   Coronary artery calcification 05/09/2023   Rupture of quadriceps tendon 11/03/2022   Wound dehiscence 10/07/2022   Quadriceps tendon rupture, right, subsequent encounter 08/30/2022   Quadriceps tendon rupture, right, initial encounter 07/12/2022   Unable to care for self 07/08/2022   Dehydration 07/08/2022   Depression 07/08/2022  COPD (chronic obstructive pulmonary disease) (HCC) 07/08/2022   Sialoadenitis of submandibular gland 04/28/2022   Macular degeneration 03/01/2022   Myofascial pain 02/10/2022   Lumbar spondylosis 01/03/2022   Acquired clawfoot, left foot 12/16/2021   Acquired clawfoot, right foot 12/16/2021   Claw foot 12/14/2021   Gait abnormality 12/10/2021   Spinal stenosis at L4-L5 level 12/10/2021   Ataxia 11/22/2021   Lightheadedness 11/02/2021   Claudication of lower extremity (HCC) 10/21/2021   Atrophy of calf muscles 10/21/2021   Weakness generalized 10/21/2021   Focal neurological deficit 10/21/2021   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   Pain in joint of  right hip 09/14/2021   High arches 07/22/2021   Hammertoes of both feet 07/22/2021   Neuropathy 07/22/2021   Constipation 05/28/2021   Disequilibrium 05/28/2021   History of colonic polyps 05/28/2021   Renal cyst 05/28/2021   Gastroesophageal reflux disease without esophagitis 05/05/2021   Ingrowing left great toenail 02/15/2021   S/P lumbar fusion 12/24/2020   Spinal stenosis of lumbar region 12/04/2020   Acquired complex renal cyst 11/23/2020   Functional gait abnormality 11/04/2020   Idiopathic neuropathy 11/02/2020   Tinea pedis of both feet 10/28/2020   Loop - Medtronic Linq 10/22/2020 10/22/2020   Benign hypertension with 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) 10/05/2020   Fall with injury 10/05/2020   Hematoma 09/16/2020   Normocytic anemia 09/14/2020   Leukocytosis 09/14/2020   AF (paroxysmal atrial fibrillation) (HCC) 09/14/2020   Chronic anticoagulation 09/14/2020   Hyponatremia 09/14/2020   Pain in left foot 01/22/2020   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   Pain in joint of right knee 12/27/2019   Recurrent falls 11/17/2019   Paroxysmal atrial fibrillation (HCC) 10/22/2019   Pain in right foot 10/14/2019   Falls 10/07/2019   Ataxia involving legs 08/12/2019   OSA on CPAP 06/20/2018   Urge incontinence 04/13/2018   Urinary urgency 04/13/2018   Pain in left knee 03/01/2018   Other intervertebral disc degeneration, lumbar region 06/16/2017   Acquired bilateral hammer toes 10/05/2015   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   Generalized anxiety disorder 05/12/2015   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   History of left-sided carotid endarterectomy 04/10/2014   Essential hypertension 04/10/2014   Occlusion and stenosis of carotid artery without mention of cerebral infarction 12/15/2011   Habitual alcohol use 12/01/2011   Carotid artery stenosis,  symptomatic 11/30/2011   Cerebral infarction (HCC) 11/29/2011   Mixed hyperlipidemia     PCP: Glenetta Hew  REFERRING PROVIDER: Glenetta Hew  REFERRING DIAG: weakness, multiple falls  Rationale for Evaluation and Treatment: Rehabilitation  THERAPY DIAG:  Other abnormalities of gait and mobility  Repeated falls  Acute pain of right knee  ONSET DATE: 10/07/22  SUBJECTIVE:  SUBJECTIVE STATEMENT: "Doing ok" Shoulder have been sore  PERTINENT HISTORY:  Cerebral infarct 10/21/21, lumbar fusion, recurrent falls, has significant neuropathy and this is the reason for most of his falls   PAIN:  Are you having pain? Yes: NPRS scale: 0/10 Pain location: right knee Pain description: ache  Aggravating factors: being on my feet  Relieving factors: movement  PRECAUTIONS: Fall  WEIGHT BEARING RESTRICTIONS: No  FALLS:  Has patient fallen in last 6 months? Yes. Number of falls every few days   LIVING ENVIRONMENT: Lives with: lives with their spouse Lives in: House/apartment Stairs: Yes: Internal: 20 steps; on right going up Has following equipment at home: Dan Humphreys - 4 wheeled, Wheelchair (manual), shower chair, Tour manager, Grab bars, Ramped entry, and trekking pole  OCCUPATION: Retired  PLOF: Independent, prior to a year ago he was driving used trekking poles to walk  PATIENT GOALS: live at home  NEXT MD VISIT:   OBJECTIVE:   DIAGNOSTIC FINDINGS:    LUMBAR SPINE FINDINGS: 1. Wide laminectomy and posterior pedicle screw and rod fixation at L4-5 without residual or recurrent stenosis. 2. Progressive adjacent level disease at L3-4 with moderate right and mild left subarticular narrowing and moderate foraminal stenosis bilaterally. Progression is predominantly due to facet disease 3.  Progressive moderate facet hypertrophy at L1-2 with a progressive rightward disc protrusion resulting in progressive moderate right foraminal stenosis. Asymmetric endplate marrow edema on the right at L1-2 is consistent with progressive disease as well. 4. Otherwise stable degenerative change without other focal stenosis.   THORACIC SPINE FINDINGS:  On sagittal views the vertebral bodies have normal height and alignment.  Hemangioma within T9 vertebral body and slightly within T10 vertebral body.  Anterior kyphosis.  Mild scoliosis convex right centered at T8 level. The spinal cord is normal in size and appearance. The paraspinal soft tissues are unremarkable.     On axial views there is no spinal stenosis or foraminal narrowing.  Limited views of the aorta, kidneys, liver, lungs and paraspinal muscles are notable for multiple large right renal cysts ranging in size from 1.5 to 7 cm.   COGNITION: Overall cognitive status: Within functional limits for tasks assessed     SENSATION: WFL  MUSCLE LENGTH: Hamstrings: mod tightness on both sides  POSTURE: rounded shoulders and forward head  LOWER EXTREMITY ROM:   the right knee has HS, has no quad tendon on the right, unable to lift the leg, left leg WFL's , left hip and right hip and ankles WFL's   LOWER EXTREMITY MMT: 4-/5 for the LE's except for the right knee extension UPPER EXTREMITY MMT:  4-/5 for shoulders and elbows   FUNCTIONAL TESTS:  5XSTS: 83 seconds really has to use his hands and is unsteady with standing Cannot stand on his own without holding onto something due to poor balance   Transfers:  needs set up and MinA for safety, is impulsive and unsafe at times. GAIT: He is w/c dependent    TODAY'S TREATMENT:  DATE:  05/17/23 Nustep level 6 x 10 minute, 3 without UE 10# straight arm pulls in w/c  3x10 10# chest press in w/c 3x10 Shoulder abd 3lb x10, 1lb x10 Leg press 50# 3x10, Heel raises 50lb 3x10 PTA blocking R knee Seated Ab sets  Seated Pball roll outs for lower back stretch   05/15/23 Nustep level 6 x 13 minute, 3 without UE 10# straight arm pulls in w/c 3x10 10# chest press in w/c 3x10 Leg press 40# 3x10 Standing hip Flex And Ext  in RW x10 eaxh PTA blocking RLE to prevent buckling Seated on Mat OHP 12lb WaTE 2x12 Seated Trunk rotations Blue 2x12   05/12/23 Nustep level 6 x 10 minutes, 3.5 minutes without arms STS in // bars 7 x working on full ext of knees esp left cg A Static standing in // bars with wt shifts CGA and cuing Standing in // bars with PTA supporting LLE and RT LE stepping fwd 10x then laterally 10 x Leg press 40# 3x10- cued for full TKE and slower controlled speed 10# straight arm pulls in w/c 2 x 15 Blue tband chest press in w/c 3x10 then flys 10# biceps curls in w/c 2 sets 15 Leg curls 35# 10x , 45# 2 sets 10- cued for full ROM Seated OH ball press 2 sets 10 - blue ball Seated blue ball core work   05/10/23 Nustep level 6 x 10 minutes, 2 minutes without arms 10# straight arm pulls in w/c 3x10 10# chest press in w/c 3x10 10# biceps curls in w/c Leg press 40# 3x10 Leg curls 35# 3x10 Green tband HS curls Green tband ankle all motions Transfers to and from surfaces and machines require CGA to Max A due to safety and the legs buckling  05/04/23 NuStep L6 x 10 min Horiz shoulder abd green 2x15 Hip add ball squeeze 2x15 Hip abd green x10, blue x 10  LAQ LLE 3lb 2x15 Seated March 3lb x10, x15 each HS curls blue 2x15 Seated OHP blue ball 2x10   05/01/23 Eval   PATIENT EDUCATION:  Education details: POC Person educated: Patient Education method: Explanation Education comprehension: verbalized understanding  HOME EXERCISE PROGRAM: Will prescribe over the next few visits  ASSESSMENT:  CLINICAL IMPRESSION: Continued to work with  patient on total body strength, conditioning and safety- cuing needed for speed and control of mvmt. Pt continues to be a impulsive and unsafe with transfers despite multiple cues. Pt only feel comfortable transferring clockwise. CG- mid A with transfers today. No reports of pain during session. UE fatigue reported with chest press and shoulder abductions.      Patient is a 83 y.o. male who was seen today for physical therapy evaluation and treatment for repeated falls and balance issues, he has had 3 quadriceps tendon ruptures in the past year, his last rupture was not repaired so he has no active right knee extension.  He reports repeated falls, falling every few days. Pt I very impulsive and unsafe with transfers, he is no longer walking and is w/c dependent.   He will benefit from skilled PT to decrease his risk for falls and address his overall strength, function and safety   OBJECTIVE IMPAIRMENTS: Abnormal gait, cardiopulmonary status limiting activity, decreased activity tolerance, decreased balance, decreased coordination, decreased endurance, decreased mobility, difficulty walking, decreased ROM, decreased strength, improper body mechanics, postural dysfunction, and poor safety .   REHAB POTENTIAL: Fair dependent on carry over and patient compliance  CLINICAL DECISION MAKING: Evolving/moderate  complexity  EVALUATION COMPLEXITY: Moderate  GOALS: Goals reviewed with patient? Yes  SHORT TERM GOALS: Target date: 05/29/23  Patient will be independent with initial HEP. Goal status: progressing 05/10/23  LONG TERM GOALS: Target date: 07/29/23  Patient will be independent with advanced/ongoing HEP to improve outcomes and carryover.  Goal status: INITIAL  2.  Understand the need for safety with his transfers and all the steps to be as safe as possible Goal status: Ongoing   3.  Patient will demonstrate improved functional LE strength as demonstrated by < 25s on 5xSTS. Baseline: 83 seconds  had to use hands and was unable to be independent with the standing Goal status: INITIAL  4. Increase UE strength to 4+/5 for better transfers and function Goal status: INITIAL  5.  Patient will report no falls in a 4 weeks period. Baseline: falls every few days Goal status: INITIAL  PLAN:  PT FREQUENCY: 2x/week  PT DURATION: 12 weeks  PLANNED INTERVENTIONS: Therapeutic exercises, Therapeutic activity, Neuromuscular re-education, Balance training, Gait training, Patient/Family education, Self Care, Joint mobilization, Stair training, Cryotherapy, Moist heat, and Manual therapy.  PLAN FOR NEXT SESSION: needs total body strength to help with his safety in transfers and independence.   Grayce Sessions, PTA

## 2023-05-18 ENCOUNTER — Ambulatory Visit: Payer: Medicare Other | Admitting: Physical Therapy

## 2023-05-22 ENCOUNTER — Encounter: Payer: Self-pay | Admitting: Physical Therapy

## 2023-05-22 ENCOUNTER — Ambulatory Visit: Payer: Medicare Other | Admitting: Physical Therapy

## 2023-05-22 DIAGNOSIS — R2689 Other abnormalities of gait and mobility: Secondary | ICD-10-CM

## 2023-05-22 DIAGNOSIS — M6249 Contracture of muscle, multiple sites: Secondary | ICD-10-CM

## 2023-05-22 DIAGNOSIS — M25561 Pain in right knee: Secondary | ICD-10-CM

## 2023-05-22 DIAGNOSIS — R296 Repeated falls: Secondary | ICD-10-CM | POA: Diagnosis not present

## 2023-05-22 DIAGNOSIS — M6281 Muscle weakness (generalized): Secondary | ICD-10-CM

## 2023-05-22 NOTE — Therapy (Signed)
 OUTPATIENT PHYSICAL THERAPY LE    Patient Name: Brandon Mendoza. MRN: [id number] DOB:1941-01-06, 83 y.o., male Today's Date: 05/22/2023  END OF SESSION:  PT End of Session - 05/22/23 0933     Visit Number 7    Date for PT Re-Evaluation 07/29/23    PT Start Time 0930    PT Stop Time 1015    PT Time Calculation (min) 45 min    Activity Tolerance Patient tolerated treatment well    Behavior During Therapy Impulsive;WFL for tasks assessed/performed;Lability             Past Medical History:  Diagnosis Date   Acquired clawfoot, right foot 12/16/2021   Anxiety    Atrophy of calf muscles 10/21/2021   Duplex calves was negative for significant blockage EMG was done at [facility] MRI lumbar spine was also done at [facility]   Carotid artery occlusion    CKD (chronic kidney disease)    Stage 3   COPD (chronic obstructive pulmonary disease) (HCC)    Depression    Dysrhythmia    PAF, atrial tachycardia   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   The spinal cord appears normal. 2.   No spinal stenosis. 3.   Mild multilevel degenerative changes as detailed above that do not lead to spinal stenosis or nerve root compression. 4.   T2 hyperintense foci within the pons consistent with chronic microvascular ischemic changes.   Loop - Medtronic Linq 10/22/2020 10/22/2020   Nocturia    Paroxysmal atrial fibrillation (HCC)    Peripheral neuropathy    Peripheral vascular disease (HCC)    Sleep apnea    on 6 cm, nasal pillow   Spinal stenosis    Stroke (HCC) 05/01/2011   ischemic   Weakness generalized 10/21/2021   Severe, legs and arms   Past Surgical History:  Procedure Laterality Date   CAROTID ENDARTERECTOMY  12/09/11   Left cea   ENDARTERECTOMY   12/09/2011   Procedure: ENDARTERECTOMY CAROTID;  Surgeon: Larina Earthly, MD;  Location: [facility] OR;  Service: Vascular;  Laterality: Left;   EYE SURGERY     rt retina detachment   HAMMER TOE SURGERY  2004   HERNIA REPAIR  2006   I & D EXTREMITY Right 10/07/2022   Procedure: IRRIGATION AND DEBRIDEMENT EXTREMITY WITH WOUND CLOSURE;  Surgeon: Venita Lick, MD;  Location: [facility] ORS;  Service: Orthopedics;  Laterality: Right;   QUADRICEPS TENDON REPAIR Right 07/12/2022   Procedure: REPAIR QUADRICEP TENDON;  Surgeon: Durene Romans, MD;  Location: [facility] OR[facility];  Service: Orthopedics;  Laterality: Right;   QUADRICEPS TENDON REPAIR Right 08/30/2022   Procedure: REPAIR QUADRICEP TENDON;  Surgeon: Durene Romans, MD;  Location: [facility];  Service: Orthopedics;  Laterality: Right;   SHOULDER ARTHROSCOPY W/ ROTATOR CUFF REPAIR  2010   TONSILLECTOMY     Patient Active Problem List   Diagnosis Date Noted   Coronary artery calcification 05/09/2023   Rupture of quadriceps tendon 11/03/2022   Wound dehiscence 10/07/2022   Quadriceps tendon rupture, right, subsequent encounter 08/30/2022   Quadriceps tendon rupture, right, initial encounter 07/12/2022   Unable to care for self 07/08/2022   Dehydration 07/08/2022   Depression 07/08/2022  COPD (chronic obstructive pulmonary disease) (HCC) 07/08/2022   Sialoadenitis of submandibular gland 04/28/2022   Macular degeneration 03/01/2022   Myofascial pain 02/10/2022   Lumbar spondylosis 01/03/2022   Acquired clawfoot, left foot 12/16/2021   Acquired clawfoot, right foot 12/16/2021   Claw foot 12/14/2021   Gait abnormality 12/10/2021   Spinal stenosis at L4-L5 level 12/10/2021   Ataxia 11/22/2021   Lightheadedness 11/02/2021   Claudication of lower extremity (HCC) 10/21/2021   Atrophy of calf muscles 10/21/2021   Weakness generalized 10/21/2021   Focal neurological deficit 10/21/2021   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   Pain in joint of  right hip 09/14/2021   High arches 07/22/2021   Hammertoes of both feet 07/22/2021   Neuropathy 07/22/2021   Constipation 05/28/2021   Disequilibrium 05/28/2021   History of colonic polyps 05/28/2021   Renal cyst 05/28/2021   Gastroesophageal reflux disease without esophagitis 05/05/2021   Ingrowing left great toenail 02/15/2021   S/P lumbar fusion 12/24/2020   Spinal stenosis of lumbar region 12/04/2020   Acquired complex renal cyst 11/23/2020   Functional gait abnormality 11/04/2020   Idiopathic neuropathy 11/02/2020   Tinea pedis of both feet 10/28/2020   Loop - Medtronic Linq 10/22/2020 10/22/2020   Benign hypertension with 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) 10/05/2020   Fall with injury 10/05/2020   Hematoma 09/16/2020   Normocytic anemia 09/14/2020   Leukocytosis 09/14/2020   AF (paroxysmal atrial fibrillation) (HCC) 09/14/2020   Chronic anticoagulation 09/14/2020   Hyponatremia 09/14/2020   Pain in left foot 01/22/2020   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   Pain in joint of right knee 12/27/2019   Recurrent falls 11/17/2019   Paroxysmal atrial fibrillation (HCC) 10/22/2019   Pain in right foot 10/14/2019   Falls 10/07/2019   Ataxia involving legs 08/12/2019   OSA on CPAP 06/20/2018   Urge incontinence 04/13/2018   Urinary urgency 04/13/2018   Pain in left knee 03/01/2018   Other intervertebral disc degeneration, lumbar region 06/16/2017   Acquired bilateral hammer toes 10/05/2015   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   Generalized anxiety disorder 05/12/2015   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   History of left-sided carotid endarterectomy 04/10/2014   Essential hypertension 04/10/2014   Occlusion and stenosis of carotid artery without mention of cerebral infarction 12/15/2011   Habitual alcohol use 12/01/2011   Carotid artery stenosis,  symptomatic 11/30/2011   Cerebral infarction (HCC) 11/29/2011   Mixed hyperlipidemia     PCP: Glenetta Hew  REFERRING PROVIDER: Glenetta Hew  REFERRING DIAG: weakness, multiple falls  Rationale for Evaluation and Treatment: Rehabilitation  THERAPY DIAG:  Other abnormalities of gait and mobility  Repeated falls  Acute pain of right knee  Muscle weakness (generalized)  Contracture of muscle, multiple sites  ONSET DATE: 10/07/22  SUBJECTIVE:  SUBJECTIVE STATEMENT: "Not so good" Had a fall last week transferring from [facility] to bed. Pt reprot snot having shoes on only socks and slipping. Has a bruise on R lateral thigh   PERTINENT HISTORY:  Cerebral infarct 10/21/21, lumbar fusion, recurrent falls, has significant neuropathy and this is the reason for most of his falls   PAIN:  Are you having pain? Yes: NPRS scale: 0/10 Pain location: right knee Pain description: ache  Aggravating factors: being on my feet  Relieving factors: movement  PRECAUTIONS: Fall  WEIGHT BEARING RESTRICTIONS: No  FALLS:  Has patient fallen in last 6 months? Yes. Number of falls every few days   LIVING ENVIRONMENT: Lives with: lives with their spouse Lives in: House/apartment Stairs: Yes: Internal: 20 steps; on right going up Has following equipment at home: Dan Humphreys - 4 wheeled, Wheelchair (manual), shower chair, Tour manager, Grab bars, Ramped entry, and trekking pole  OCCUPATION: Retired  PLOF: Independent, prior to a year ago he was driving used trekking poles to walk  PATIENT GOALS: live at home  NEXT MD VISIT:   OBJECTIVE:   DIAGNOSTIC FINDINGS:    LUMBAR SPINE FINDINGS: 1. Wide laminectomy and posterior pedicle screw and rod fixation at L4-5 without residual or recurrent stenosis. 2. Progressive  adjacent level disease at L3-4 with moderate right and mild left subarticular narrowing and moderate foraminal stenosis bilaterally. Progression is predominantly due to facet disease 3. Progressive moderate facet hypertrophy at L1-2 with a progressive rightward disc protrusion resulting in progressive moderate right foraminal stenosis. Asymmetric endplate marrow edema on the right at L1-2 is consistent with progressive disease as well. 4. Otherwise stable degenerative change without other focal stenosis.   THORACIC SPINE FINDINGS:  On sagittal views the vertebral bodies have normal height and alignment.  Hemangioma within T9 vertebral body and slightly within T10 vertebral body.  Anterior kyphosis.  Mild scoliosis convex right centered at T8 level. The spinal cord is normal in size and appearance. The paraspinal soft tissues are unremarkable.     On axial views there is no spinal stenosis or foraminal narrowing.  Limited views of the aorta, kidneys, liver, lungs and paraspinal muscles are notable for multiple large right renal cysts ranging in size from 1.5 to 7 cm.   COGNITION: Overall cognitive status: Within functional limits for tasks assessed     SENSATION: WFL  MUSCLE LENGTH: Hamstrings: mod tightness on both sides  POSTURE: rounded shoulders and forward head  LOWER EXTREMITY ROM:   the right knee has HS, has no quad tendon on the right, unable to lift the leg, left leg WFL's , left hip and right hip and ankles WFL's   LOWER EXTREMITY MMT: 4-/5 for the LE's except for the right knee extension UPPER EXTREMITY MMT:  4-/5 for shoulders and elbows   FUNCTIONAL TESTS:  5XSTS: 83 seconds really has to use his hands and is unsteady with standing Cannot stand on his own without holding onto something due to poor balance   Transfers:  needs set up and MinA for safety, is impulsive and unsafe at times. GAIT: He is w/c dependent    TODAY'S TREATMENT:  DATE:  05/22/23 Nustep level 6 x 10 minute, 3 without UE Seated Crunches 2x15 Seated weight overhead ball toss 2x15 LAQ LLE 5lb 2x15 Seated march 5lb 2x10 HS curls blue 2x15 Seated blue ball OHP 3x10 Horiz abd blue 3x10 Shoulder Ext 10lb 2x15  05/17/23 Nustep level 6 x 10 minute, 3 without UE 10# straight arm pulls in w/c 3x10 10# chest press in w/c 3x10 Shoulder abd 3lb x10, 1lb x10 Leg press 50# 3x10, Heel raises 50lb 3x10 PTA blocking R knee Seated Ab sets  Seated Pball roll outs for lower back stretch   05/15/23 Nustep level 6 x 13 minute, 3 without UE 10# straight arm pulls in w/c 3x10 10# chest press in w/c 3x10 Leg press 40# 3x10 Standing hip Flex And Ext  in RW x10 eaxh PTA blocking RLE to prevent buckling Seated on Mat OHP 12lb WaTE 2x12 Seated Trunk rotations Blue 2x12   05/12/23 Nustep level 6 x 10 minutes, 3.5 minutes without arms STS in // bars 7 x working on full ext of knees esp left cg A Static standing in // bars with wt shifts CGA and cuing Standing in // bars with PTA supporting LLE and RT LE stepping fwd 10x then laterally 10 x Leg press 40# 3x10- cued for full TKE and slower controlled speed 10# straight arm pulls in w/c 2 x 15 Blue tband chest press in w/c 3x10 then flys 10# biceps curls in w/c 2 sets 15 Leg curls 35# 10x , 45# 2 sets 10- cued for full ROM Seated OH ball press 2 sets 10 - blue ball Seated blue ball core work   05/10/23 Nustep level 6 x 10 minutes, 2 minutes without arms 10# straight arm pulls in w/c 3x10 10# chest press in w/c 3x10 10# biceps curls in w/c Leg press 40# 3x10 Leg curls 35# 3x10 Green tband HS curls Green tband ankle all motions Transfers to and from surfaces and machines require CGA to Max A due to safety and the legs buckling  05/04/23 NuStep L6 x 10 min Horiz shoulder abd green 2x15 Hip add ball squeeze 2x15 Hip  abd green x10, blue x 10  LAQ LLE 3lb 2x15 Seated March 3lb x10, x15 each HS curls blue 2x15 Seated OHP blue ball 2x10   05/01/23 Eval   PATIENT EDUCATION:  Education details: POC Person educated: Patient Education method: Explanation Education comprehension: verbalized understanding  HOME EXERCISE PROGRAM: Will prescribe over the next few visits  ASSESSMENT:  CLINICAL IMPRESSION: Continued to work with patient on total body strength, conditioning and safety- cuing needed for speed and control of mvmt. Pt reports having a fall last week from not wearing shoes when transferring.  Pt continues to be a impulsive and unsafe with transfers despite multiple cues. Pt only feel comfortable transferring clockwise. CG- mid A with transfers today. No reports of pain during session. Majority of session completed n mat without back support..      Patient is a 83 y.o. male who was seen today for physical therapy evaluation and treatment for repeated falls and balance issues, he has had 3 quadriceps tendon ruptures in the past year, his last rupture was not repaired so he has no active right knee extension.  He reports repeated falls, falling every few days. Pt I very impulsive and unsafe with transfers, he is no longer walking and is w/c dependent.   He will benefit from skilled PT to decrease his risk for falls and address his  overall strength, function and safety   OBJECTIVE IMPAIRMENTS: Abnormal gait, cardiopulmonary status limiting activity, decreased activity tolerance, decreased balance, decreased coordination, decreased endurance, decreased mobility, difficulty walking, decreased ROM, decreased strength, improper body mechanics, postural dysfunction, and poor safety .   REHAB POTENTIAL: Fair dependent on carry over and patient compliance  CLINICAL DECISION MAKING: Evolving/moderate complexity  EVALUATION COMPLEXITY: Moderate  GOALS: Goals reviewed with patient? Yes  SHORT TERM GOALS:  Target date: 05/29/23  Patient will be independent with initial HEP. Goal status: progressing 05/10/23  LONG TERM GOALS: Target date: 07/29/23  Patient will be independent with advanced/ongoing HEP to improve outcomes and carryover.  Goal status: INITIAL  2.  Understand the need for safety with his transfers and all the steps to be as safe as possible Goal status: Ongoing 05/02/23  3.  Patient will demonstrate improved functional LE strength as demonstrated by < 25s on 5xSTS. Baseline: 83 seconds had to use hands and was unable to be independent with the standing Goal status: INITIAL  4. Increase UE strength to 4+/5 for better transfers and function Goal status: INITIAL  5.  Patient will report no falls in a 4 weeks period. Baseline: falls every few days Goal status: Ongoing 05/22/23 fall last "Thursday or Friday"   PLAN:  PT FREQUENCY: 2x/week  PT DURATION: 12 weeks  PLANNED INTERVENTIONS: Therapeutic exercises, Therapeutic activity, Neuromuscular re-education, Balance training, Gait training, Patient/Family education, Self Care, Joint mobilization, Stair training, Cryotherapy, Moist heat, and Manual therapy.  PLAN FOR NEXT SESSION: needs total body strength to help with his safety in transfers and independence.   Grayce Sessions, PTA

## 2023-05-25 ENCOUNTER — Encounter: Payer: Self-pay | Admitting: Physical Therapy

## 2023-05-25 ENCOUNTER — Ambulatory Visit: Payer: Medicare Other | Admitting: Physical Therapy

## 2023-05-25 DIAGNOSIS — M6281 Muscle weakness (generalized): Secondary | ICD-10-CM

## 2023-05-25 DIAGNOSIS — M25561 Pain in right knee: Secondary | ICD-10-CM

## 2023-05-25 DIAGNOSIS — R296 Repeated falls: Secondary | ICD-10-CM

## 2023-05-25 DIAGNOSIS — R2689 Other abnormalities of gait and mobility: Secondary | ICD-10-CM

## 2023-05-25 NOTE — Therapy (Signed)
 OUTPATIENT PHYSICAL THERAPY LE    Patient Name: Brandon Mendoza. MRN: [id number] DOB:05-01-1940, 83 y.o., male Today's Date: 05/25/2023  END OF SESSION:  PT End of Session - 05/25/23 0850     Visit Number 8    Date for PT Re-Evaluation 07/29/23    Authorization Type Medicare    PT Start Time [phone]    PT Stop Time 0930    PT Time Calculation (min) 47 min    Activity Tolerance Patient tolerated treatment well    Behavior During Therapy [facility] for tasks assessed/performed;Impulsive             Past Medical History:  Diagnosis Date   Acquired clawfoot, right foot 12/16/2021   Anxiety    Atrophy of calf muscles 10/21/2021   Duplex calves was negative for significant blockage EMG was done at [facility] MRI lumbar spine was also done at [facility]   Carotid artery occlusion    CKD (chronic kidney disease)    Stage 3   COPD (chronic obstructive pulmonary disease) (HCC)    Depression    Dysrhythmia    PAF, atrial tachycardia   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   The spinal cord appears normal. 2.   No spinal stenosis. 3.   Mild multilevel degenerative changes as detailed above that do not lead to spinal stenosis or nerve root compression. 4.   T2 hyperintense foci within the pons consistent with chronic microvascular ischemic changes.   Loop - Medtronic Linq 10/22/2020 10/22/2020   Nocturia    Paroxysmal atrial fibrillation (HCC)    Peripheral neuropathy    Peripheral vascular disease (HCC)    Sleep apnea    on 6 cm, nasal pillow   Spinal stenosis    Stroke (HCC) 05/01/2011   ischemic   Weakness generalized 10/21/2021   Severe, legs and arms   Past Surgical History:  Procedure Laterality Date   CAROTID ENDARTERECTOMY  12/09/11   Left cea    ENDARTERECTOMY  12/09/2011   Procedure: ENDARTERECTOMY CAROTID;  Surgeon: Larina Earthly, MD;  Location: [facility] OR;  Service: Vascular;  Laterality: Left;   EYE SURGERY     rt retina detachment   HAMMER TOE SURGERY  2004   HERNIA REPAIR  2006   I & D EXTREMITY Right 10/07/2022   Procedure: IRRIGATION AND DEBRIDEMENT EXTREMITY WITH WOUND CLOSURE;  Surgeon: Venita Lick, MD;  Location: [facility] ORS;  Service: Orthopedics;  Laterality: Right;   QUADRICEPS TENDON REPAIR Right 07/12/2022   Procedure: REPAIR QUADRICEP TENDON;  Surgeon: Durene Romans, MD;  Location: [facility] ORS;  Service: Orthopedics;  Laterality: Right;   QUADRICEPS TENDON REPAIR Right 08/30/2022   Procedure: REPAIR QUADRICEP TENDON;  Surgeon: Durene Romans, MD;  Location: [facility];  Service: Orthopedics;  Laterality: Right;   SHOULDER ARTHROSCOPY W/ ROTATOR CUFF REPAIR  2010   TONSILLECTOMY     Patient Active Problem List   Diagnosis Date Noted   Coronary artery calcification 05/09/2023   Rupture of quadriceps tendon 11/03/2022   Wound dehiscence 10/07/2022   Quadriceps tendon rupture, right, subsequent encounter 08/30/2022   Quadriceps tendon rupture, right, initial encounter 07/12/2022   Unable to care for self 07/08/2022   Dehydration  07/08/2022   Depression 07/08/2022   COPD (chronic obstructive pulmonary disease) (HCC) 07/08/2022   Sialoadenitis of submandibular gland 04/28/2022   Macular degeneration 03/01/2022   Myofascial pain 02/10/2022   Lumbar spondylosis 01/03/2022   Acquired clawfoot, left foot 12/16/2021   Acquired clawfoot, right foot 12/16/2021   Claw foot 12/14/2021   Gait abnormality 12/10/2021   Spinal stenosis at L4-L5 level 12/10/2021   Ataxia 11/22/2021   Lightheadedness 11/02/2021   Claudication of lower extremity (HCC) 10/21/2021   Atrophy of calf muscles 10/21/2021   Weakness generalized 10/21/2021   Focal neurological deficit 10/21/2021   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    Pain in joint of right hip 09/14/2021   High arches 07/22/2021   Hammertoes of both feet 07/22/2021   Neuropathy 07/22/2021   Constipation 05/28/2021   Disequilibrium 05/28/2021   History of colonic polyps 05/28/2021   Renal cyst 05/28/2021   Gastroesophageal reflux disease without esophagitis 05/05/2021   Ingrowing left great toenail 02/15/2021   S/P lumbar fusion 12/24/2020   Spinal stenosis of lumbar region 12/04/2020   Acquired complex renal cyst 11/23/2020   Functional gait abnormality 11/04/2020   Idiopathic neuropathy 11/02/2020   Tinea pedis of both feet 10/28/2020   Loop - Medtronic Linq 10/22/2020 10/22/2020   Benign hypertension with 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) 10/05/2020   Fall with injury 10/05/2020   Hematoma 09/16/2020   Normocytic anemia 09/14/2020   Leukocytosis 09/14/2020   AF (paroxysmal atrial fibrillation) (HCC) 09/14/2020   Chronic anticoagulation 09/14/2020   Hyponatremia 09/14/2020   Pain in left foot 01/22/2020   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   Pain in joint of right knee 12/27/2019   Recurrent falls 11/17/2019   Paroxysmal atrial fibrillation (HCC) 10/22/2019   Pain in right foot 10/14/2019   Falls 10/07/2019   Ataxia involving legs 08/12/2019   OSA on CPAP 06/20/2018   Urge incontinence 04/13/2018   Urinary urgency 04/13/2018   Pain in left knee 03/01/2018   Other intervertebral disc degeneration, lumbar region 06/16/2017   Acquired bilateral hammer toes 10/05/2015   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   Generalized anxiety disorder 05/12/2015   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   History of left-sided carotid endarterectomy 04/10/2014   Essential hypertension 04/10/2014   Occlusion and stenosis of carotid artery without mention of cerebral infarction 12/15/2011   Habitual alcohol use 12/01/2011   Carotid  artery stenosis, symptomatic 11/30/2011   Cerebral infarction (HCC) 11/29/2011   Mixed hyperlipidemia     PCP: Glenetta Hew  REFERRING PROVIDER: Glenetta Hew  REFERRING DIAG: weakness, multiple falls  Rationale for Evaluation and Treatment: Rehabilitation  THERAPY DIAG:  Other abnormalities of gait and mobility  Repeated falls  Acute pain of right knee  Muscle weakness (generalized)  ONSET DATE: 10/07/22  SUBJECTIVE:  SUBJECTIVE STATEMENT: "Patient reports no new falls, reports frustrated with not feeling any stronger  PERTINENT HISTORY:  Cerebral infarct 10/21/21, lumbar fusion, recurrent falls, has significant neuropathy and this is the reason for most of his falls   PAIN:  Are you having pain? Yes: NPRS scale: 0/10 Pain location: right knee Pain description: ache  Aggravating factors: being on my feet  Relieving factors: movement  PRECAUTIONS: Fall  WEIGHT BEARING RESTRICTIONS: No  FALLS:  Has patient fallen in last 6 months? Yes. Number of falls every few days   LIVING ENVIRONMENT: Lives with: lives with their spouse Lives in: House/apartment Stairs: Yes: Internal: 20 steps; on right going up Has following equipment at home: Dan Humphreys - 4 wheeled, Wheelchair (manual), shower chair, Tour manager, Grab bars, Ramped entry, and trekking pole  OCCUPATION: Retired  PLOF: Independent, prior to a year ago he was driving used trekking poles to walk  PATIENT GOALS: live at home  NEXT MD VISIT:   OBJECTIVE:   DIAGNOSTIC FINDINGS:    LUMBAR SPINE FINDINGS: 1. Wide laminectomy and posterior pedicle screw and rod fixation at L4-5 without residual or recurrent stenosis. 2. Progressive adjacent level disease at L3-4 with moderate right and mild left subarticular narrowing and  moderate foraminal stenosis bilaterally. Progression is predominantly due to facet disease 3. Progressive moderate facet hypertrophy at L1-2 with a progressive rightward disc protrusion resulting in progressive moderate right foraminal stenosis. Asymmetric endplate marrow edema on the right at L1-2 is consistent with progressive disease as well. 4. Otherwise stable degenerative change without other focal stenosis.   THORACIC SPINE FINDINGS:  On sagittal views the vertebral bodies have normal height and alignment.  Hemangioma within T9 vertebral body and slightly within T10 vertebral body.  Anterior kyphosis.  Mild scoliosis convex right centered at T8 level. The spinal cord is normal in size and appearance. The paraspinal soft tissues are unremarkable.     On axial views there is no spinal stenosis or foraminal narrowing.  Limited views of the aorta, kidneys, liver, lungs and paraspinal muscles are notable for multiple large right renal cysts ranging in size from 1.5 to 7 cm.   COGNITION: Overall cognitive status: Within functional limits for tasks assessed     SENSATION: WFL  MUSCLE LENGTH: Hamstrings: mod tightness on both sides  POSTURE: rounded shoulders and forward head  LOWER EXTREMITY ROM:   the right knee has HS, has no quad tendon on the right, unable to lift the leg, left leg WFL's , left hip and right hip and ankles WFL's   LOWER EXTREMITY MMT: 4-/5 for the LE's except for the right knee extension UPPER EXTREMITY MMT:  4-/5 for shoulders and elbows   FUNCTIONAL TESTS:  5XSTS: 83 seconds really has to use his hands and is unsteady with standing Cannot stand on his own without holding onto something due to poor balance   Transfers:  needs set up and MinA for safety, is impulsive and unsafe at times. GAIT: He is w/c dependent    TODAY'S TREATMENT:  DATE:  05/25/23 Nustep level 6 10 minutes 3 minutes with LE's only In pbars standing marches with PT clocking knees In pbars standing balance with PT close to knees if they give Leg curls 55# 2x10 both legs, right leg only 25# 2 x10 LEg extension left only 10# 3x10 15# straight arm pulls 15# rows 10# chest press Biceps 15# and 20# 2x10 10# overhead press  05/22/23 Nustep level 6 x 10 minute, 3 without UE Seated Crunches 2x15 Seated weight overhead ball toss 2x15 LAQ LLE 5lb 2x15 Seated march 5lb 2x10 HS curls blue 2x15 Seated blue ball OHP 3x10 Horiz abd blue 3x10 Shoulder Ext 10lb 2x15  05/17/23 Nustep level 6 x 10 minute, 3 without UE 10# straight arm pulls in w/c 3x10 10# chest press in w/c 3x10 Shoulder abd 3lb x10, 1lb x10 Leg press 50# 3x10, Heel raises 50lb 3x10 PTA blocking R knee Seated Ab sets  Seated Pball roll outs for lower back stretch   05/15/23 Nustep level 6 x 13 minute, 3 without UE 10# straight arm pulls in w/c 3x10 10# chest press in w/c 3x10 Leg press 40# 3x10 Standing hip Flex And Ext  in RW x10 eaxh PTA blocking RLE to prevent buckling Seated on Mat OHP 12lb WaTE 2x12 Seated Trunk rotations Blue 2x12   05/12/23 Nustep level 6 x 10 minutes, 3.5 minutes without arms STS in // bars 7 x working on full ext of knees esp left cg A Static standing in // bars with wt shifts CGA and cuing Standing in // bars with PTA supporting LLE and RT LE stepping fwd 10x then laterally 10 x Leg press 40# 3x10- cued for full TKE and slower controlled speed 10# straight arm pulls in w/c 2 x 15 Blue tband chest press in w/c 3x10 then flys 10# biceps curls in w/c 2 sets 15 Leg curls 35# 10x , 45# 2 sets 10- cued for full ROM Seated OH ball press 2 sets 10 - blue ball Seated blue ball core work   05/10/23 Nustep level 6 x 10 minutes, 2 minutes without arms 10# straight arm pulls in w/c 3x10 10# chest press in w/c 3x10 10# biceps curls in w/c Leg press 40#  3x10 Leg curls 35# 3x10 Green tband HS curls Green tband ankle all motions Transfers to and from surfaces and machines require CGA to Max A due to safety and the legs buckling  05/04/23 NuStep L6 x 10 min Horiz shoulder abd green 2x15 Hip add ball squeeze 2x15 Hip abd green x10, blue x 10  LAQ LLE 3lb 2x15 Seated March 3lb x10, x15 each HS curls blue 2x15 Seated OHP blue ball 2x10   05/01/23 Eval   PATIENT EDUCATION:  Education details: POC Person educated: Patient Education method: Explanation Education comprehension: verbalized understanding  HOME EXERCISE PROGRAM: Will prescribe over the next few visits  ASSESSMENT:  CLINICAL IMPRESSION: Continued to work with patient on total body strength, conditioning and safety- he continues to be impulsive and at times he is so set in how he wants to do things is unsafe and does not think through the transfer, he will at times not use the legs at all even though he has good left LE strength.   Pt continues to be a impulsive and unsafe with transfers despite multiple cues. Pt only feel comfortable transferring clockwise. CG- mid A with transfers today. No reports of pain during session. Majority of session completed n mat without back support.Marland Kitchen  Patient is a 83 y.o. male who was seen today for physical therapy evaluation and treatment for repeated falls and balance issues, he has had 3 quadriceps tendon ruptures in the past year, his last rupture was not repaired so he has no active right knee extension.  He reports repeated falls, falling every few days. Pt I very impulsive and unsafe with transfers, he is no longer walking and is w/c dependent.   He will benefit from skilled PT to decrease his risk for falls and address his overall strength, function and safety   OBJECTIVE IMPAIRMENTS: Abnormal gait, cardiopulmonary status limiting activity, decreased activity tolerance, decreased balance, decreased coordination, decreased endurance,  decreased mobility, difficulty walking, decreased ROM, decreased strength, improper body mechanics, postural dysfunction, and poor safety .   REHAB POTENTIAL: Fair dependent on carry over and patient compliance  CLINICAL DECISION MAKING: Evolving/moderate complexity  EVALUATION COMPLEXITY: Moderate  GOALS: Goals reviewed with patient? Yes  SHORT TERM GOALS: Target date: 05/29/23  Patient will be independent with initial HEP. Goal status: progressing 05/10/23  LONG TERM GOALS: Target date: 07/29/23  Patient will be independent with advanced/ongoing HEP to improve outcomes and carryover.  Goal status: INITIAL  2.  Understand the need for safety with his transfers and all the steps to be as safe as possible Goal status: Ongoing 05/24/23  3.  Patient will demonstrate improved functional LE strength as demonstrated by < 25s on 5xSTS. Baseline: 83 seconds had to use hands and was unable to be independent with the standing Goal status: ongoing 05/24/23  4. Increase UE strength to 4+/5 for better transfers and function Goal status: ongoing 05/24/23  5.  Patient will report no falls in a 4 weeks period. Baseline: falls every few days Goal status: Ongoing 05/22/23 fall last "Thursday or Friday"   PLAN:  PT FREQUENCY: 2x/week  PT DURATION: 12 weeks  PLANNED INTERVENTIONS: Therapeutic exercises, Therapeutic activity, Neuromuscular re-education, Balance training, Gait training, Patient/Family education, Self Care, Joint mobilization, Stair training, Cryotherapy, Moist heat, and Manual therapy.  PLAN FOR NEXT SESSION: needs total body strength to help with his safety in transfers and independence.   Jearld Lesch, PT

## 2023-05-29 ENCOUNTER — Ambulatory Visit: Payer: Medicare Other | Attending: Internal Medicine | Admitting: Physical Therapy

## 2023-05-29 ENCOUNTER — Ambulatory Visit (INDEPENDENT_AMBULATORY_CARE_PROVIDER_SITE_OTHER): Payer: Medicare Other

## 2023-05-29 ENCOUNTER — Institutional Professional Consult (permissible substitution): Payer: Medicare Other | Admitting: Pulmonary Disease

## 2023-05-29 ENCOUNTER — Encounter: Payer: Self-pay | Admitting: Physical Therapy

## 2023-05-29 DIAGNOSIS — M6249 Contracture of muscle, multiple sites: Secondary | ICD-10-CM | POA: Diagnosis present

## 2023-05-29 DIAGNOSIS — M6281 Muscle weakness (generalized): Secondary | ICD-10-CM | POA: Insufficient documentation

## 2023-05-29 DIAGNOSIS — I480 Paroxysmal atrial fibrillation: Secondary | ICD-10-CM

## 2023-05-29 DIAGNOSIS — M5459 Other low back pain: Secondary | ICD-10-CM | POA: Diagnosis present

## 2023-05-29 DIAGNOSIS — R296 Repeated falls: Secondary | ICD-10-CM | POA: Insufficient documentation

## 2023-05-29 DIAGNOSIS — R2689 Other abnormalities of gait and mobility: Secondary | ICD-10-CM | POA: Insufficient documentation

## 2023-05-29 DIAGNOSIS — M25561 Pain in right knee: Secondary | ICD-10-CM | POA: Insufficient documentation

## 2023-05-29 LAB — CUP PACEART REMOTE DEVICE CHECK
Date Time Interrogation Session: 20250302230654
Implantable Pulse Generator Implant Date: 20220728

## 2023-05-29 NOTE — Therapy (Signed)
 OUTPATIENT PHYSICAL THERAPY LE    Patient Name: Brandon Mendoza. MRN: [id number] DOB:May 06, 1940, 83 y.o., male Today's Date: 05/29/2023  END OF SESSION:  PT End of Session - 05/29/23 0841     Visit Number 9    Date for PT Re-Evaluation 07/29/23    Authorization Type Medicare    PT Start Time 0842    PT Stop Time 0928    PT Time Calculation (min) 46 min    Activity Tolerance Patient tolerated treatment well    Behavior During Therapy [facility] for tasks assessed/performed;Impulsive             Past Medical History:  Diagnosis Date   Acquired clawfoot, right foot 12/16/2021   Anxiety    Atrophy of calf muscles 10/21/2021   Duplex calves was negative for significant blockage EMG was done at [facility] MRI lumbar spine was also done at [facility]   Carotid artery occlusion    CKD (chronic kidney disease)    Stage 3   COPD (chronic obstructive pulmonary disease) (HCC)    Depression    Dysrhythmia    PAF, atrial tachycardia   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   The spinal cord appears normal. 2.   No spinal stenosis. 3.   Mild multilevel degenerative changes as detailed above that do not lead to spinal stenosis or nerve root compression. 4.   T2 hyperintense foci within the pons consistent with chronic microvascular ischemic changes.   Loop - Medtronic Linq 10/22/2020 10/22/2020   Nocturia    Paroxysmal atrial fibrillation (HCC)    Peripheral neuropathy    Peripheral vascular disease (HCC)    Sleep apnea    on 6 cm, nasal pillow   Spinal stenosis    Stroke (HCC) 05/01/2011   ischemic   Weakness generalized 10/21/2021   Severe, legs and arms   Past Surgical History:  Procedure Laterality Date   CAROTID ENDARTERECTOMY  12/09/11   Left cea    ENDARTERECTOMY  12/09/2011   Procedure: ENDARTERECTOMY CAROTID;  Surgeon: Larina Earthly, MD;  Location: [facility] OR;  Service: Vascular;  Laterality: Left;   EYE SURGERY     rt retina detachment   HAMMER TOE SURGERY  2004   HERNIA REPAIR  2006   I & D EXTREMITY Right 10/07/2022   Procedure: IRRIGATION AND DEBRIDEMENT EXTREMITY WITH WOUND CLOSURE;  Surgeon: Venita Lick, MD;  Location: [facility] ORS;  Service: Orthopedics;  Laterality: Right;   QUADRICEPS TENDON REPAIR Right 07/12/2022   Procedure: REPAIR QUADRICEP TENDON;  Surgeon: Durene Romans, MD;  Location: [facility] ORS;  Service: Orthopedics;  Laterality: Right;   QUADRICEPS TENDON REPAIR Right 08/30/2022   Procedure: REPAIR QUADRICEP TENDON;  Surgeon: Durene Romans, MD;  Location: [facility];  Service: Orthopedics;  Laterality: Right;   SHOULDER ARTHROSCOPY W/ ROTATOR CUFF REPAIR  2010   TONSILLECTOMY     Patient Active Problem List   Diagnosis Date Noted   Coronary artery calcification 05/09/2023   Rupture of quadriceps tendon 11/03/2022   Wound dehiscence 10/07/2022   Quadriceps tendon rupture, right, subsequent encounter 08/30/2022   Quadriceps tendon rupture, right, initial encounter 07/12/2022   Unable to care for self 07/08/2022   Dehydration  07/08/2022   Depression 07/08/2022   COPD (chronic obstructive pulmonary disease) (HCC) 07/08/2022   Sialoadenitis of submandibular gland 04/28/2022   Macular degeneration 03/01/2022   Myofascial pain 02/10/2022   Lumbar spondylosis 01/03/2022   Acquired clawfoot, left foot 12/16/2021   Acquired clawfoot, right foot 12/16/2021   Claw foot 12/14/2021   Gait abnormality 12/10/2021   Spinal stenosis at L4-L5 level 12/10/2021   Ataxia 11/22/2021   Lightheadedness 11/02/2021   Claudication of lower extremity (HCC) 10/21/2021   Atrophy of calf muscles 10/21/2021   Weakness generalized 10/21/2021   Focal neurological deficit 10/21/2021   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    Pain in joint of right hip 09/14/2021   High arches 07/22/2021   Hammertoes of both feet 07/22/2021   Neuropathy 07/22/2021   Constipation 05/28/2021   Disequilibrium 05/28/2021   History of colonic polyps 05/28/2021   Renal cyst 05/28/2021   Gastroesophageal reflux disease without esophagitis 05/05/2021   Ingrowing left great toenail 02/15/2021   S/P lumbar fusion 12/24/2020   Spinal stenosis of lumbar region 12/04/2020   Acquired complex renal cyst 11/23/2020   Functional gait abnormality 11/04/2020   Idiopathic neuropathy 11/02/2020   Tinea pedis of both feet 10/28/2020   Loop - Medtronic Linq 10/22/2020 10/22/2020   Benign hypertension with 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) 10/05/2020   Fall with injury 10/05/2020   Hematoma 09/16/2020   Normocytic anemia 09/14/2020   Leukocytosis 09/14/2020   AF (paroxysmal atrial fibrillation) (HCC) 09/14/2020   Chronic anticoagulation 09/14/2020   Hyponatremia 09/14/2020   Pain in left foot 01/22/2020   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   Pain in joint of right knee 12/27/2019   Recurrent falls 11/17/2019   Paroxysmal atrial fibrillation (HCC) 10/22/2019   Pain in right foot 10/14/2019   Falls 10/07/2019   Ataxia involving legs 08/12/2019   OSA on CPAP 06/20/2018   Urge incontinence 04/13/2018   Urinary urgency 04/13/2018   Pain in left knee 03/01/2018   Other intervertebral disc degeneration, lumbar region 06/16/2017   Acquired bilateral hammer toes 10/05/2015   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   Generalized anxiety disorder 05/12/2015   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   History of left-sided carotid endarterectomy 04/10/2014   Essential hypertension 04/10/2014   Occlusion and stenosis of carotid artery without mention of cerebral infarction 12/15/2011   Habitual alcohol use 12/01/2011   Carotid  artery stenosis, symptomatic 11/30/2011   Cerebral infarction (HCC) 11/29/2011   Mixed hyperlipidemia     PCP: Glenetta Hew  REFERRING PROVIDER: Glenetta Hew  REFERRING DIAG: weakness, multiple falls  Rationale for Evaluation and Treatment: Rehabilitation  THERAPY DIAG:  Other abnormalities of gait and mobility  Repeated falls  Muscle weakness (generalized)  Acute pain of right knee  Other low back pain  ONSET DATE: 10/07/22  SUBJECTIVE:  SUBJECTIVE STATEMENT: No falls, reports that he is frustrated  PERTINENT HISTORY:  Cerebral infarct 10/21/21, lumbar fusion, recurrent falls, has significant neuropathy and this is the reason for most of his falls   PAIN:  Are you having pain? Yes: NPRS scale: 0/10 Pain location: right knee Pain description: ache  Aggravating factors: being on my feet  Relieving factors: movement  PRECAUTIONS: Fall  WEIGHT BEARING RESTRICTIONS: No  FALLS:  Has patient fallen in last 6 months? Yes. Number of falls every few days   LIVING ENVIRONMENT: Lives with: lives with their spouse Lives in: House/apartment Stairs: Yes: Internal: 20 steps; on right going up Has following equipment at home: Dan Humphreys - 4 wheeled, Wheelchair (manual), shower chair, Tour manager, Grab bars, Ramped entry, and trekking pole  OCCUPATION: Retired  PLOF: Independent, prior to a year ago he was driving used trekking poles to walk  PATIENT GOALS: live at home  NEXT MD VISIT:   OBJECTIVE:   DIAGNOSTIC FINDINGS:    LUMBAR SPINE FINDINGS: 1. Wide laminectomy and posterior pedicle screw and rod fixation at L4-5 without residual or recurrent stenosis. 2. Progressive adjacent level disease at L3-4 with moderate right and mild left subarticular narrowing and moderate foraminal  stenosis bilaterally. Progression is predominantly due to facet disease 3. Progressive moderate facet hypertrophy at L1-2 with a progressive rightward disc protrusion resulting in progressive moderate right foraminal stenosis. Asymmetric endplate marrow edema on the right at L1-2 is consistent with progressive disease as well. 4. Otherwise stable degenerative change without other focal stenosis.   THORACIC SPINE FINDINGS:  On sagittal views the vertebral bodies have normal height and alignment.  Hemangioma within T9 vertebral body and slightly within T10 vertebral body.  Anterior kyphosis.  Mild scoliosis convex right centered at T8 level. The spinal cord is normal in size and appearance. The paraspinal soft tissues are unremarkable.     On axial views there is no spinal stenosis or foraminal narrowing.  Limited views of the aorta, kidneys, liver, lungs and paraspinal muscles are notable for multiple large right renal cysts ranging in size from 1.5 to 7 cm.   COGNITION: Overall cognitive status: Within functional limits for tasks assessed     SENSATION: WFL  MUSCLE LENGTH: Hamstrings: mod tightness on both sides  POSTURE: rounded shoulders and forward head  LOWER EXTREMITY ROM:   the right knee has HS, has no quad tendon on the right, unable to lift the leg, left leg WFL's , left hip and right hip and ankles WFL's   LOWER EXTREMITY MMT: 4-/5 for the LE's except for the right knee extension UPPER EXTREMITY MMT:  4-/5 for shoulders and elbows   FUNCTIONAL TESTS:  5XSTS: 83 seconds really has to use his hands and is unsteady with standing Cannot stand on his own without holding onto something due to poor balance   Transfers:  needs set up and MinA for safety, is impulsive and unsafe at times. GAIT: He is w/c dependent    TODAY'S TREATMENT:  DATE:   05/29/23 Bike level 5 x 6 minutes 15# in w/c straight arm pulls 15# rows 10# chest press 20# biceps curls 10# overhead press 55# leg curls both 2x10, right only 25# 2x10 Leg extension left only 10# 2x10 LEg press 40# 2x10, then single legs 2x10, right leg was eccentrics Pball in lap isometric abs Nustep level 6 LE only 2x10  05/25/23 Nustep level 6 10 minutes 3 minutes with LE's only In pbars standing marches with PT clocking knees In pbars standing balance with PT close to knees if they give Leg curls 55# 2x10 both legs, right leg only 25# 2 x10 LEg extension left only 10# 3x10 15# straight arm pulls 15# rows 10# chest press Biceps 15# and 20# 2x10 10# overhead press  05/22/23 Nustep level 6 x 10 minute, 3 without UE Seated Crunches 2x15 Seated weight overhead ball toss 2x15 LAQ LLE 5lb 2x15 Seated march 5lb 2x10 HS curls blue 2x15 Seated blue ball OHP 3x10 Horiz abd blue 3x10 Shoulder Ext 10lb 2x15  05/17/23 Nustep level 6 x 10 minute, 3 without UE 10# straight arm pulls in w/c 3x10 10# chest press in w/c 3x10 Shoulder abd 3lb x10, 1lb x10 Leg press 50# 3x10, Heel raises 50lb 3x10 PTA blocking R knee Seated Ab sets  Seated Pball roll outs for lower back stretch   05/15/23 Nustep level 6 x 13 minute, 3 without UE 10# straight arm pulls in w/c 3x10 10# chest press in w/c 3x10 Leg press 40# 3x10 Standing hip Flex And Ext  in RW x10 eaxh PTA blocking RLE to prevent buckling Seated on Mat OHP 12lb WaTE 2x12 Seated Trunk rotations Blue 2x12   05/12/23 Nustep level 6 x 10 minutes, 3.5 minutes without arms STS in // bars 7 x working on full ext of knees esp left cg A Static standing in // bars with wt shifts CGA and cuing Standing in // bars with PTA supporting LLE and RT LE stepping fwd 10x then laterally 10 x Leg press 40# 3x10- cued for full TKE and slower controlled speed 10# straight arm pulls in w/c 2 x 15 Blue tband chest press in w/c 3x10 then flys 10#  biceps curls in w/c 2 sets 15 Leg curls 35# 10x , 45# 2 sets 10- cued for full ROM Seated OH ball press 2 sets 10 - blue ball Seated blue ball core work   05/10/23 Nustep level 6 x 10 minutes, 2 minutes without arms 10# straight arm pulls in w/c 3x10 10# chest press in w/c 3x10 10# biceps curls in w/c Leg press 40# 3x10 Leg curls 35# 3x10 Green tband HS curls Green tband ankle all motions Transfers to and from surfaces and machines require CGA to Max A due to safety and the legs buckling  05/04/23 NuStep L6 x 10 min Horiz shoulder abd green 2x15 Hip add ball squeeze 2x15 Hip abd green x10, blue x 10  LAQ LLE 3lb 2x15 Seated March 3lb x10, x15 each HS curls blue 2x15 Seated OHP blue ball 2x10   05/01/23 Eval   PATIENT EDUCATION:  Education details: POC Person educated: Patient Education method: Explanation Education comprehension: verbalized understanding  HOME EXERCISE PROGRAM: Will prescribe over the next few visits  ASSESSMENT:  CLINICAL IMPRESSION: Continued to work with patient on total body strength, conditioning and safety- he continues to be impulsive and at times he is so set in how he wants to do things he is unsafe and does not think  through the transfer, he will at times not use the legs at all even though he has good left LE strength needs cues to stand all the way up and get legs under neath him before turning, as he tries to be in a hurry and when he turns without getting all the way he needs mod to max A due to the poor transfers   Pt continues to be a impulsive and unsafe with transfers despite multiple cues. Pt only feel comfortable transferring clockwise. CG- mid A with transfers today. No reports of pain during session. Majority of session completed n mat without back support..    Patient is a 83 y.o. male who was seen today for physical therapy evaluation and treatment for repeated falls and balance issues, he has had 3 quadriceps tendon ruptures in the  past year, his last rupture was not repaired so he has no active right knee extension.  He reports repeated falls, falling every few days. Pt I very impulsive and unsafe with transfers, he is no longer walking and is w/c dependent.   He will benefit from skilled PT to decrease his risk for falls and address his overall strength, function and safety   OBJECTIVE IMPAIRMENTS: Abnormal gait, cardiopulmonary status limiting activity, decreased activity tolerance, decreased balance, decreased coordination, decreased endurance, decreased mobility, difficulty walking, decreased ROM, decreased strength, improper body mechanics, postural dysfunction, and poor safety .   REHAB POTENTIAL: Fair dependent on carry over and patient compliance  CLINICAL DECISION MAKING: Evolving/moderate complexity  EVALUATION COMPLEXITY: Moderate  GOALS: Goals reviewed with patient? Yes  SHORT TERM GOALS: Target date: 05/29/23  Patient will be independent with initial HEP. Goal status: progressing 05/10/23  LONG TERM GOALS: Target date: 07/29/23  Patient will be independent with advanced/ongoing HEP to improve outcomes and carryover.  Goal status: INITIAL  2.  Understand the need for safety with his transfers and all the steps to be as safe as possible Goal status: Ongoing 05/24/23  3.  Patient will demonstrate improved functional LE strength as demonstrated by < 25s on 5xSTS. Baseline: 83 seconds had to use hands and was unable to be independent with the standing Goal status: ongoing 05/24/23  4. Increase UE strength to 4+/5 for better transfers and function Goal status: ongoing 05/24/23  5.  Patient will report no falls in a 4 weeks period. Baseline: falls every few days Goal status: Ongoing 05/22/23 fall last "Thursday or Friday"   PLAN:  PT FREQUENCY: 2x/week  PT DURATION: 12 weeks  PLANNED INTERVENTIONS: Therapeutic exercises, Therapeutic activity, Neuromuscular re-education, Balance training, Gait training,  Patient/Family education, Self Care, Joint mobilization, Stair training, Cryotherapy, Moist heat, and Manual therapy.  PLAN FOR NEXT SESSION: needs total body strength to help with his safety in transfers and independence.   Jearld Lesch, PT

## 2023-06-01 ENCOUNTER — Ambulatory Visit: Payer: Medicare Other | Admitting: Physical Therapy

## 2023-06-01 ENCOUNTER — Encounter: Payer: Self-pay | Admitting: Physical Therapy

## 2023-06-01 DIAGNOSIS — R2689 Other abnormalities of gait and mobility: Secondary | ICD-10-CM

## 2023-06-01 DIAGNOSIS — M6281 Muscle weakness (generalized): Secondary | ICD-10-CM

## 2023-06-01 DIAGNOSIS — R296 Repeated falls: Secondary | ICD-10-CM

## 2023-06-01 NOTE — Therapy (Signed)
 OUTPATIENT PHYSICAL THERAPY LE  Progress Note Reporting Period 05/01/23 to 06/01/23  See note below for Objective Data and Assessment of Progress/Goals.      Patient Name: Brandon Mendoza. MRN: [id number] DOB:02-Mar-1941, 83 y.o., male Today's Date: 06/01/2023  END OF SESSION:  PT End of Session - 06/01/23 0846     Visit Number 10    Date for PT Re-Evaluation 07/29/23    PT Start Time 0845    PT Stop Time 0930    PT Time Calculation (min) 45 min    Activity Tolerance Patient tolerated treatment well    Behavior During Therapy [facility] for tasks assessed/performed;Impulsive             Past Medical History:  Diagnosis Date   Acquired clawfoot, right foot 12/16/2021   Anxiety    Atrophy of calf muscles 10/21/2021   Duplex calves was negative for significant blockage EMG was done at [facility] MRI lumbar spine was also done at [facility]   Carotid artery occlusion    CKD (chronic kidney disease)    Stage 3   COPD (chronic obstructive pulmonary disease) (HCC)    Depression    Dysrhythmia    PAF, atrial tachycardia   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   The spinal cord appears normal. 2.   No spinal stenosis. 3.   Mild multilevel degenerative changes as detailed above that do not lead to spinal stenosis or nerve root compression. 4.   T2 hyperintense foci within the pons consistent with chronic microvascular ischemic changes.   Loop - Medtronic Linq 10/22/2020 10/22/2020   Nocturia    Paroxysmal atrial fibrillation (HCC)    Peripheral neuropathy    Peripheral vascular disease (HCC)    Sleep apnea    on 6 cm, nasal pillow   Spinal stenosis    Stroke (HCC) 05/01/2011   ischemic   Weakness generalized 10/21/2021   Severe, legs and arms   Past  Surgical History:  Procedure Laterality Date   CAROTID ENDARTERECTOMY  12/09/11   Left cea   ENDARTERECTOMY  12/09/2011   Procedure: ENDARTERECTOMY CAROTID;  Surgeon: Larina Earthly, MD;  Location: [facility] OR;  Service: Vascular;  Laterality: Left;   EYE SURGERY     rt retina detachment   HAMMER TOE SURGERY  2004   HERNIA REPAIR  2006   I & D EXTREMITY Right 10/07/2022   Procedure: IRRIGATION AND DEBRIDEMENT EXTREMITY WITH WOUND CLOSURE;  Surgeon: Venita Lick, MD;  Location: [facility] ORS;  Service: Orthopedics;  Laterality: Right;   QUADRICEPS TENDON REPAIR Right 07/12/2022   Procedure: REPAIR QUADRICEP TENDON;  Surgeon: Durene Romans, MD;  Location: [facility] ORS;  Service: Orthopedics;  Laterality: Right;   QUADRICEPS TENDON REPAIR Right 08/30/2022   Procedure: REPAIR QUADRICEP TENDON;  Surgeon: Durene Romans, MD;  Location: [facility] ORS;  Service: Orthopedics;  Laterality: Right;   SHOULDER ARTHROSCOPY W/ ROTATOR CUFF REPAIR  2010   TONSILLECTOMY     Patient Active Problem List   Diagnosis Date Noted   Coronary artery calcification 05/09/2023   Rupture of quadriceps tendon 11/03/2022   Wound dehiscence 10/07/2022   Quadriceps tendon rupture, right, subsequent encounter 08/30/2022   Quadriceps tendon rupture,  right, initial encounter 07/12/2022   Unable to care for self 07/08/2022   Dehydration 07/08/2022   Depression 07/08/2022   COPD (chronic obstructive pulmonary disease) (HCC) 07/08/2022   Sialoadenitis of submandibular gland 04/28/2022   Macular degeneration 03/01/2022   Myofascial pain 02/10/2022   Lumbar spondylosis 01/03/2022   Acquired clawfoot, left foot 12/16/2021   Acquired clawfoot, right foot 12/16/2021   Claw foot 12/14/2021   Gait abnormality 12/10/2021   Spinal stenosis at L4-L5 level 12/10/2021   Ataxia 11/22/2021   Lightheadedness 11/02/2021   Claudication of lower extremity (HCC) 10/21/2021   Atrophy of calf muscles 10/21/2021   Weakness generalized 10/21/2021   Focal  neurological deficit 10/21/2021   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   Pain in joint of right hip 09/14/2021   High arches 07/22/2021   Hammertoes of both feet 07/22/2021   Neuropathy 07/22/2021   Constipation 05/28/2021   Disequilibrium 05/28/2021   History of colonic polyps 05/28/2021   Renal cyst 05/28/2021   Gastroesophageal reflux disease without esophagitis 05/05/2021   Ingrowing left great toenail 02/15/2021   S/P lumbar fusion 12/24/2020   Spinal stenosis of lumbar region 12/04/2020   Acquired complex renal cyst 11/23/2020   Functional gait abnormality 11/04/2020   Idiopathic neuropathy 11/02/2020   Tinea pedis of both feet 10/28/2020   Loop - Medtronic Linq 10/22/2020 10/22/2020   Benign hypertension with 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) 10/05/2020   Fall with injury 10/05/2020   Hematoma 09/16/2020   Normocytic anemia 09/14/2020   Leukocytosis 09/14/2020   AF (paroxysmal atrial fibrillation) (HCC) 09/14/2020   Chronic anticoagulation 09/14/2020   Hyponatremia 09/14/2020   Pain in left foot 01/22/2020   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   Pain in joint of right knee 12/27/2019   Recurrent falls 11/17/2019   Paroxysmal atrial fibrillation (HCC) 10/22/2019   Pain in right foot 10/14/2019   Falls 10/07/2019   Ataxia involving legs 08/12/2019   OSA on CPAP 06/20/2018   Urge incontinence 04/13/2018   Urinary urgency 04/13/2018   Pain in left knee 03/01/2018   Other intervertebral disc degeneration, lumbar region 06/16/2017   Acquired bilateral hammer toes 10/05/2015   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   Generalized anxiety disorder 05/12/2015   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   History of left-sided carotid endarterectomy 04/10/2014   Essential hypertension 04/10/2014   Occlusion and stenosis of  carotid artery without mention of cerebral infarction 12/15/2011   Habitual alcohol use 12/01/2011   Carotid artery stenosis, symptomatic 11/30/2011   Cerebral infarction (HCC) 11/29/2011   Mixed hyperlipidemia     PCP: Glenetta Hew  REFERRING PROVIDER: Glenetta Hew  REFERRING DIAG: weakness, multiple falls  Rationale for Evaluation and Treatment: Rehabilitation  THERAPY DIAG:  Other abnormalities of gait and mobility  Repeated falls  Muscle weakness (generalized)  ONSET DATE: 10/07/22  SUBJECTIVE:  SUBJECTIVE STATEMENT: Doing well no recent falls, Has an ache in the area of L quad tendon  PERTINENT HISTORY:  Cerebral infarct 10/21/21, lumbar fusion, recurrent falls, has significant neuropathy and this is the reason for most of his falls   PAIN:  Are you having pain? Yes: NPRS scale: 0/10 Pain location: right knee Pain description: ache  Aggravating factors: being on my feet  Relieving factors: movement  PRECAUTIONS: Fall  WEIGHT BEARING RESTRICTIONS: No  FALLS:  Has patient fallen in last 6 months? Yes. Number of falls every few days   LIVING ENVIRONMENT: Lives with: lives with their spouse Lives in: House/apartment Stairs: Yes: Internal: 20 steps; on right going up Has following equipment at home: Dan Humphreys - 4 wheeled, Wheelchair (manual), shower chair, Tour manager, Grab bars, Ramped entry, and trekking pole  OCCUPATION: Retired  PLOF: Independent, prior to a year ago he was driving used trekking poles to walk  PATIENT GOALS: live at home  NEXT MD VISIT:   OBJECTIVE:   DIAGNOSTIC FINDINGS:    LUMBAR SPINE FINDINGS: 1. Wide laminectomy and posterior pedicle screw and rod fixation at L4-5 without residual or recurrent stenosis. 2. Progressive adjacent level disease at  L3-4 with moderate right and mild left subarticular narrowing and moderate foraminal stenosis bilaterally. Progression is predominantly due to facet disease 3. Progressive moderate facet hypertrophy at L1-2 with a progressive rightward disc protrusion resulting in progressive moderate right foraminal stenosis. Asymmetric endplate marrow edema on the right at L1-2 is consistent with progressive disease as well. 4. Otherwise stable degenerative change without other focal stenosis.   THORACIC SPINE FINDINGS:  On sagittal views the vertebral bodies have normal height and alignment.  Hemangioma within T9 vertebral body and slightly within T10 vertebral body.  Anterior kyphosis.  Mild scoliosis convex right centered at T8 level. The spinal cord is normal in size and appearance. The paraspinal soft tissues are unremarkable.     On axial views there is no spinal stenosis or foraminal narrowing.  Limited views of the aorta, kidneys, liver, lungs and paraspinal muscles are notable for multiple large right renal cysts ranging in size from 1.5 to 7 cm.   COGNITION: Overall cognitive status: Within functional limits for tasks assessed     SENSATION: WFL  MUSCLE LENGTH: Hamstrings: mod tightness on both sides  POSTURE: rounded shoulders and forward head  LOWER EXTREMITY ROM:   the right knee has HS, has no quad tendon on the right, unable to lift the leg, left leg WFL's , left hip and right hip and ankles WFL's   LOWER EXTREMITY MMT: 4-/5 for the LE's except for the right knee extension UPPER EXTREMITY MMT:  4-/5 for shoulders and elbows   FUNCTIONAL TESTS:  5XSTS: 83 seconds really has to use his hands and is unsteady with standing Cannot stand on his own without holding onto something due to poor balance   Transfers:  needs set up and MinA for safety, is impulsive and unsafe at times. GAIT: He is w/c dependent    TODAY'S TREATMENT:  DATE:  06/01/23 At Mat table 5XSTS to The back of WC: 64 seconds really has to use his hands and is unsteady with standing Cannot stand on his own without holding onto something due to not having a L quad tendon MMT RUE flex 4/5 abd 4-/5, LUE Flex 4/5, Abd 4/5 Seated military press 5lb 2x10 Seated rows blue 2x15 Horiz abd green 2x15 HS curls green x15, blue x 15  Seated crunches 2x15 Nustep level 6 10 minutes 3 minutes with LE's only  05/29/23 Bike level 5 x 6 minutes 15# in w/c straight arm pulls 15# rows 10# chest press 20# biceps curls 10# overhead press 55# leg curls both 2x10, right only 25# 2x10 Leg extension left only 10# 2x10 LEg press 40# 2x10, then single legs 2x10, right leg was eccentrics Pball in lap isometric abs Nustep level 6 LE only 2x10  05/25/23 Nustep level 6 10 minutes 3 minutes with LE's only In pbars standing marches with PT clocking knees In pbars standing balance with PT close to knees if they give Leg curls 55# 2x10 both legs, right leg only 25# 2 x10 LEg extension left only 10# 3x10 15# straight arm pulls 15# rows 10# chest press Biceps 15# and 20# 2x10 10# overhead press  05/22/23 Nustep level 6 x 10 minute, 3 without UE Seated Crunches 2x15 Seated weight overhead ball toss 2x15 LAQ LLE 5lb 2x15 Seated march 5lb 2x10 HS curls blue 2x15 Seated blue ball OHP 3x10 Horiz abd blue 3x10 Shoulder Ext 10lb 2x15  05/17/23 Nustep level 6 x 10 minute, 3 without UE 10# straight arm pulls in w/c 3x10 10# chest press in w/c 3x10 Shoulder abd 3lb x10, 1lb x10 Leg press 50# 3x10, Heel raises 50lb 3x10 PTA blocking R knee Seated Ab sets  Seated Pball roll outs for lower back stretch   05/15/23 Nustep level 6 x 13 minute, 3 without UE 10# straight arm pulls in w/c 3x10 10# chest press in w/c 3x10 Leg press 40# 3x10 Standing hip Flex And Ext  in RW x10 eaxh PTA blocking RLE to  prevent buckling Seated on Mat OHP 12lb WaTE 2x12 Seated Trunk rotations Blue 2x12   05/12/23 Nustep level 6 x 10 minutes, 3.5 minutes without arms STS in // bars 7 x working on full ext of knees esp left cg A Static standing in // bars with wt shifts CGA and cuing Standing in // bars with PTA supporting LLE and RT LE stepping fwd 10x then laterally 10 x Leg press 40# 3x10- cued for full TKE and slower controlled speed 10# straight arm pulls in w/c 2 x 15 Blue tband chest press in w/c 3x10 then flys 10# biceps curls in w/c 2 sets 15 Leg curls 35# 10x , 45# 2 sets 10- cued for full ROM Seated OH ball press 2 sets 10 - blue ball Seated blue ball core work   05/10/23 Nustep level 6 x 10 minutes, 2 minutes without arms 10# straight arm pulls in w/c 3x10 10# chest press in w/c 3x10 10# biceps curls in w/c Leg press 40# 3x10 Leg curls 35# 3x10 Green tband HS curls Green tband ankle all motions Transfers to and from surfaces and machines require CGA to Max A due to safety and the legs buckling  05/04/23 NuStep L6 x 10 min Horiz shoulder abd green 2x15 Hip add ball squeeze 2x15 Hip abd green x10, blue x 10  LAQ LLE 3lb 2x15 Seated March 3lb x10, x15 each  HS curls blue 2x15 Seated OHP blue ball 2x10   05/01/23 Eval   PATIENT EDUCATION:  Education details: POC Person educated: Patient Education method: Explanation Education comprehension: verbalized understanding  HOME EXERCISE PROGRAM: Will prescribe over the next few visits  ASSESSMENT:  CLINICAL IMPRESSION: Continued to work with patient on total body strength, conditioning and safety. He has improved decreasing his 5x sit to stands time but its not functional or safe due to R knee impairment and and a history of balance issues. R Shoulder is noticeably  weaker than the L with MMT.  Pt continues to be a impulsive and unsafe with transfers despite multiple cues.  Pt only feel comfortable transferring clockwise. CG- mid A  with transfers today. No reports of pain during session. Majority of session completed n mat without back support..    Patient is a 83 y.o. male who was seen today for physical therapy evaluation and treatment for repeated falls and balance issues, he has had 3 quadriceps tendon ruptures in the past year, his last rupture was not repaired so he has no active right knee extension.  He reports repeated falls, falling every few days. Pt I very impulsive and unsafe with transfers, he is no longer walking and is w/c dependent.   He will benefit from skilled PT to decrease his risk for falls and address his overall strength, function and safety   OBJECTIVE IMPAIRMENTS: Abnormal gait, cardiopulmonary status limiting activity, decreased activity tolerance, decreased balance, decreased coordination, decreased endurance, decreased mobility, difficulty walking, decreased ROM, decreased strength, improper body mechanics, postural dysfunction, and poor safety .   REHAB POTENTIAL: Fair dependent on carry over and patient compliance  CLINICAL DECISION MAKING: Evolving/moderate complexity  EVALUATION COMPLEXITY: Moderate  GOALS: Goals reviewed with patient? Yes  SHORT TERM GOALS: Target date: 05/29/23  Patient will be independent with initial HEP. Goal status: Met  05/10/23  LONG TERM GOALS: Target date: 07/29/23  Patient will be independent with advanced/ongoing HEP to improve outcomes and carryover.  Goal status: INITIAL  2.  Understand the need for safety with his transfers and all the steps to be as safe as possible Goal status: Ongoing 06/01/23  3.  Patient will demonstrate improved functional LE strength as demonstrated by < 25s on 5xSTS. Baseline: 83 seconds had to use hands and was unable to be independent with the standing Goal status: Progressing 06/01/23  4. Increase UE strength to 4+/5 for better transfers and function Goal status: Progressing  06/01/23  5.  Patient will report no falls in a 4  weeks period. Baseline: falls every few days Goal status: Ongoing 06/01/23 fall last "20 or 21" of Feburary   PLAN:  PT FREQUENCY: 2x/week  PT DURATION: 12 weeks  PLANNED INTERVENTIONS: Therapeutic exercises, Therapeutic activity, Neuromuscular re-education, Balance training, Gait training, Patient/Family education, Self Care, Joint mobilization, Stair training, Cryotherapy, Moist heat, and Manual therapy.  PLAN FOR NEXT SESSION: needs total body strength to help with his safety in transfers and independence.   Grayce Sessions, PTA

## 2023-06-01 NOTE — Progress Notes (Signed)
 [facility] Summary Report / Loop Recorder

## 2023-06-07 ENCOUNTER — Encounter: Payer: Self-pay | Admitting: Physical Therapy

## 2023-06-07 ENCOUNTER — Ambulatory Visit: Payer: Medicare Other | Admitting: Physical Therapy

## 2023-06-07 DIAGNOSIS — M6281 Muscle weakness (generalized): Secondary | ICD-10-CM

## 2023-06-07 DIAGNOSIS — R2689 Other abnormalities of gait and mobility: Secondary | ICD-10-CM | POA: Diagnosis not present

## 2023-06-07 DIAGNOSIS — M25561 Pain in right knee: Secondary | ICD-10-CM

## 2023-06-07 DIAGNOSIS — R296 Repeated falls: Secondary | ICD-10-CM

## 2023-06-07 DIAGNOSIS — M6249 Contracture of muscle, multiple sites: Secondary | ICD-10-CM

## 2023-06-07 NOTE — Therapy (Signed)
 OUTPATIENT PHYSICAL THERAPY TREATMENT     Patient Name: Brandon Mendoza. MRN: [id number] DOB:08/28/1940, 83 y.o., male Today's Date: 06/07/2023  END OF SESSION:  PT End of Session - 06/07/23 0849     Visit Number 11    Date for PT Re-Evaluation 07/29/23    PT Start Time 0845    PT Stop Time 0930    PT Time Calculation (min) 45 min             Past Medical History:  Diagnosis Date   Acquired clawfoot, right foot 12/16/2021   Anxiety    Atrophy of calf muscles 10/21/2021   Duplex calves was negative for significant blockage EMG was done at [facility] MRI lumbar spine was also done at [facility]   Carotid artery occlusion    CKD (chronic kidney disease)    Stage 3   COPD (chronic obstructive pulmonary disease) (HCC)    Depression    Dysrhythmia    PAF, atrial tachycardia   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   The spinal cord appears normal. 2.   No spinal stenosis. 3.   Mild multilevel degenerative changes as detailed above that do not lead to spinal stenosis or nerve root compression. 4.   T2 hyperintense foci within the pons consistent with chronic microvascular ischemic changes.   Loop - Medtronic Linq 10/22/2020 10/22/2020   Nocturia    Paroxysmal atrial fibrillation (HCC)    Peripheral neuropathy    Peripheral vascular disease (HCC)    Sleep apnea    on 6 cm, nasal pillow   Spinal stenosis    Stroke (HCC) 05/01/2011   ischemic   Weakness generalized 10/21/2021   Severe, legs and arms   Past Surgical History:  Procedure Laterality Date   CAROTID ENDARTERECTOMY  12/09/11   Left cea   ENDARTERECTOMY  12/09/2011   Procedure: ENDARTERECTOMY CAROTID;  Surgeon: Larina Earthly, MD;  Location: [facility] OR;  Service: Vascular;  Laterality: Left;    EYE SURGERY     rt retina detachment   HAMMER TOE SURGERY  2004   HERNIA REPAIR  2006   I & D EXTREMITY Right 10/07/2022   Procedure: IRRIGATION AND DEBRIDEMENT EXTREMITY WITH WOUND CLOSURE;  Surgeon: Venita Lick, MD;  Location: [facility] ORS;  Service: Orthopedics;  Laterality: Right;   QUADRICEPS TENDON REPAIR Right 07/12/2022   Procedure: REPAIR QUADRICEP TENDON;  Surgeon: Durene Romans, MD;  Location: [facility];  Service: Orthopedics;  Laterality: Right;   QUADRICEPS TENDON REPAIR Right 08/30/2022   Procedure: REPAIR QUADRICEP TENDON;  Surgeon: Durene Romans, MD;  Location: [facility];  Service: Orthopedics;  Laterality: Right;   SHOULDER ARTHROSCOPY W/ ROTATOR CUFF REPAIR  2010   TONSILLECTOMY     Patient Active Problem List   Diagnosis Date Noted   Coronary artery calcification 05/09/2023   Rupture of quadriceps tendon 11/03/2022   Wound dehiscence 10/07/2022   Quadriceps tendon rupture, right, subsequent encounter 08/30/2022   Quadriceps tendon rupture, right, initial encounter 07/12/2022   Unable to care for self 07/08/2022   Dehydration 07/08/2022   Depression 07/08/2022   COPD (chronic obstructive pulmonary disease) (HCC) 07/08/2022   Sialoadenitis of submandibular gland 04/28/2022   Macular  degeneration 03/01/2022   Myofascial pain 02/10/2022   Lumbar spondylosis 01/03/2022   Acquired clawfoot, left foot 12/16/2021   Acquired clawfoot, right foot 12/16/2021   Claw foot 12/14/2021   Gait abnormality 12/10/2021   Spinal stenosis at L4-L5 level 12/10/2021   Ataxia 11/22/2021   Lightheadedness 11/02/2021   Claudication of lower extremity (HCC) 10/21/2021   Atrophy of calf muscles 10/21/2021   Weakness generalized 10/21/2021   Focal neurological deficit 10/21/2021   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   Pain in joint of right hip 09/14/2021   High arches 07/22/2021   Hammertoes of both feet 07/22/2021   Neuropathy 07/22/2021   Constipation  05/28/2021   Disequilibrium 05/28/2021   History of colonic polyps 05/28/2021   Renal cyst 05/28/2021   Gastroesophageal reflux disease without esophagitis 05/05/2021   Ingrowing left great toenail 02/15/2021   S/P lumbar fusion 12/24/2020   Spinal stenosis of lumbar region 12/04/2020   Acquired complex renal cyst 11/23/2020   Functional gait abnormality 11/04/2020   Idiopathic neuropathy 11/02/2020   Tinea pedis of both feet 10/28/2020   Loop - Medtronic Linq 10/22/2020 10/22/2020   Benign hypertension with 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) 10/05/2020   Fall with injury 10/05/2020   Hematoma 09/16/2020   Normocytic anemia 09/14/2020   Leukocytosis 09/14/2020   AF (paroxysmal atrial fibrillation) (HCC) 09/14/2020   Chronic anticoagulation 09/14/2020   Hyponatremia 09/14/2020   Pain in left foot 01/22/2020   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   Pain in joint of right knee 12/27/2019   Recurrent falls 11/17/2019   Paroxysmal atrial fibrillation (HCC) 10/22/2019   Pain in right foot 10/14/2019   Falls 10/07/2019   Ataxia involving legs 08/12/2019   OSA on CPAP 06/20/2018   Urge incontinence 04/13/2018   Urinary urgency 04/13/2018   Pain in left knee 03/01/2018   Other intervertebral disc degeneration, lumbar region 06/16/2017   Acquired bilateral hammer toes 10/05/2015   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   Generalized anxiety disorder 05/12/2015   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   History of left-sided carotid endarterectomy 04/10/2014   Essential hypertension 04/10/2014   Occlusion and stenosis of carotid artery without mention of cerebral infarction 12/15/2011   Habitual alcohol use 12/01/2011   Carotid artery stenosis, symptomatic 11/30/2011   Cerebral infarction (HCC) 11/29/2011   Mixed hyperlipidemia     PCP: Glenetta Hew  REFERRING  PROVIDER: Glenetta Hew  REFERRING DIAG: weakness, multiple falls  Rationale for Evaluation and Treatment: Rehabilitation  THERAPY DIAG:  Other abnormalities of gait and mobility  Repeated falls  Muscle weakness (generalized)  Acute pain of right knee  Contracture of muscle, multiple sites  ONSET DATE: 10/07/22  SUBJECTIVE:  SUBJECTIVE STATEMENT: Doing well, no falls  PERTINENT HISTORY:  Cerebral infarct 10/21/21, lumbar fusion, recurrent falls, has significant neuropathy and this is the reason for most of his falls   PAIN:  Are you having pain? Yes: NPRS scale: 0/10 Pain location: right knee Pain description: ache  Aggravating factors: being on my feet  Relieving factors: movement  PRECAUTIONS: Fall  WEIGHT BEARING RESTRICTIONS: No  FALLS:  Has patient fallen in last 6 months? Yes. Number of falls every few days   LIVING ENVIRONMENT: Lives with: lives with their spouse Lives in: House/apartment Stairs: Yes: Internal: 20 steps; on right going up Has following equipment at home: Dan Humphreys - 4 wheeled, Wheelchair (manual), shower chair, Tour manager, Grab bars, Ramped entry, and trekking pole  OCCUPATION: Retired  PLOF: Independent, prior to a year ago he was driving used trekking poles to walk  PATIENT GOALS: live at home  NEXT MD VISIT:   OBJECTIVE:   DIAGNOSTIC FINDINGS:    LUMBAR SPINE FINDINGS: 1. Wide laminectomy and posterior pedicle screw and rod fixation at L4-5 without residual or recurrent stenosis. 2. Progressive adjacent level disease at L3-4 with moderate right and mild left subarticular narrowing and moderate foraminal stenosis bilaterally. Progression is predominantly due to facet disease 3. Progressive moderate facet hypertrophy at L1-2 with a  progressive rightward disc protrusion resulting in progressive moderate right foraminal stenosis. Asymmetric endplate marrow edema on the right at L1-2 is consistent with progressive disease as well. 4. Otherwise stable degenerative change without other focal stenosis.   THORACIC SPINE FINDINGS:  On sagittal views the vertebral bodies have normal height and alignment.  Hemangioma within T9 vertebral body and slightly within T10 vertebral body.  Anterior kyphosis.  Mild scoliosis convex right centered at T8 level. The spinal cord is normal in size and appearance. The paraspinal soft tissues are unremarkable.     On axial views there is no spinal stenosis or foraminal narrowing.  Limited views of the aorta, kidneys, liver, lungs and paraspinal muscles are notable for multiple large right renal cysts ranging in size from 1.5 to 7 cm.   COGNITION: Overall cognitive status: Within functional limits for tasks assessed     SENSATION: WFL  MUSCLE LENGTH: Hamstrings: mod tightness on both sides  POSTURE: rounded shoulders and forward head  LOWER EXTREMITY ROM:   the right knee has HS, has no quad tendon on the right, unable to lift the leg, left leg WFL's , left hip and right hip and ankles WFL's   LOWER EXTREMITY MMT: 4-/5 for the LE's except for the right knee extension UPPER EXTREMITY MMT:  4-/5 for shoulders and elbows   FUNCTIONAL TESTS:  5XSTS: 83 seconds really has to use his hands and is unsteady with standing Cannot stand on his own without holding onto something due to poor balance   Transfers:  needs set up and MinA for safety, is impulsive and unsafe at times. GAIT: He is w/c dependent    TODAY'S TREATMENT:  DATE:  06/07/23 NuStep L 5 x 7 min, x4 min LE only 15# in w/c straight arm pulls 15# rows 10# chest press 20# biceps curls Leg press 40#  2x15 AT Mat table   4lb OHP   Crunches   Horiz shoulder Abd green 2x10  Seated march no UE support   06/01/23 At [facility] table 5XSTS to The back of WC: 64 seconds really has to use his hands and is unsteady with standing Cannot stand on his own without holding onto something due to not having a L quad tendon MMT RUE flex 4/5 abd 4-/5, LUE Flex 4/5, Abd 4/5 Seated military press 5lb 2x10 Seated rows blue 2x15 Horiz abd green 2x15 HS curls green x15, blue x 15  Seated crunches 2x15 Nustep level 6 10 minutes 3 minutes with LE's only  05/29/23 Bike level 5 x 6 minutes 15# in w/c straight arm pulls 15# rows 10# chest press 20# biceps curls 10# overhead press 55# leg curls both 2x10, right only 25# 2x10 Leg extension left only 10# 2x10 LEg press 40# 2x10, then single legs 2x10, right leg was eccentrics Pball in lap isometric abs Nustep level 6 LE only 2x10  05/25/23 Nustep level 6 10 minutes 3 minutes with LE's only In pbars standing marches with PT clocking knees In pbars standing balance with PT close to knees if they give Leg curls 55# 2x10 both legs, right leg only 25# 2 x10 LEg extension left only 10# 3x10 15# straight arm pulls 15# rows 10# chest press Biceps 15# and 20# 2x10 10# overhead press  05/22/23 Nustep level 6 x 10 minute, 3 without UE Seated Crunches 2x15 Seated weight overhead ball toss 2x15 LAQ LLE 5lb 2x15 Seated march 5lb 2x10 HS curls blue 2x15 Seated blue ball OHP 3x10 Horiz abd blue 3x10 Shoulder Ext 10lb 2x15  05/17/23 Nustep level 6 x 10 minute, 3 without UE 10# straight arm pulls in w/c 3x10 10# chest press in w/c 3x10 Shoulder abd 3lb x10, 1lb x10 Leg press 50# 3x10, Heel raises 50lb 3x10 PTA blocking R knee Seated Ab sets  Seated Pball roll outs for lower back stretch   05/15/23 Nustep level 6 x 13 minute, 3 without UE 10# straight arm pulls in w/c 3x10 10# chest press in w/c 3x10 Leg press 40# 3x10 Standing hip Flex And Ext  in RW x10  eaxh PTA blocking RLE to prevent buckling Seated on Mat OHP 12lb WaTE 2x12 Seated Trunk rotations Blue 2x12   05/12/23 Nustep level 6 x 10 minutes, 3.5 minutes without arms STS in // bars 7 x working on full ext of knees esp left cg A Static standing in // bars with wt shifts CGA and cuing Standing in // bars with PTA supporting LLE and RT LE stepping fwd 10x then laterally 10 x Leg press 40# 3x10- cued for full TKE and slower controlled speed 10# straight arm pulls in w/c 2 x 15 Blue tband chest press in w/c 3x10 then flys 10# biceps curls in w/c 2 sets 15 Leg curls 35# 10x , 45# 2 sets 10- cued for full ROM Seated OH ball press 2 sets 10 - blue ball Seated blue ball core work   05/10/23 Nustep level 6 x 10 minutes, 2 minutes without arms 10# straight arm pulls in w/c 3x10 10# chest press in w/c 3x10 10# biceps curls in w/c Leg press 40# 3x10 Leg curls 35# 3x10 Green tband HS curls Green tband ankle all  motions Transfers to and from surfaces and machines require CGA to Max A due to safety and the legs buckling  05/04/23 NuStep L6 x 10 min Horiz shoulder abd green 2x15 Hip add ball squeeze 2x15 Hip abd green x10, blue x 10  LAQ LLE 3lb 2x15 Seated March 3lb x10, x15 each HS curls blue 2x15 Seated OHP blue ball 2x10   05/01/23 Eval   PATIENT EDUCATION:  Education details: POC Person educated: Patient Education method: Explanation Education comprehension: verbalized understanding  HOME EXERCISE PROGRAM: Will prescribe over the next few visits  ASSESSMENT:  CLINICAL IMPRESSION: Continued to work with patient on total body strength, conditioning and safety.  Pt continues to be a impulsive and unsafe with transfers despite multiple cues.  Pt only feel comfortable transferring clockwise. CG- mid A with transfers today. No reports of pain during session. Cue needed for pacing at times not to complete reps too fast.   Patient is a 83 y.o. male who was seen today for  physical therapy evaluation and treatment for repeated falls and balance issues, he has had 3 quadriceps tendon ruptures in the past year, his last rupture was not repaired so he has no active right knee extension.  He reports repeated falls, falling every few days. Pt I very impulsive and unsafe with transfers, he is no longer walking and is w/c dependent.   He will benefit from skilled PT to decrease his risk for falls and address his overall strength, function and safety   OBJECTIVE IMPAIRMENTS: Abnormal gait, cardiopulmonary status limiting activity, decreased activity tolerance, decreased balance, decreased coordination, decreased endurance, decreased mobility, difficulty walking, decreased ROM, decreased strength, improper body mechanics, postural dysfunction, and poor safety .   REHAB POTENTIAL: Fair dependent on carry over and patient compliance  CLINICAL DECISION MAKING: Evolving/moderate complexity  EVALUATION COMPLEXITY: Moderate  GOALS: Goals reviewed with patient? Yes  SHORT TERM GOALS: Target date: 05/29/23  Patient will be independent with initial HEP. Goal status: Met  05/10/23  LONG TERM GOALS: Target date: 07/29/23  Patient will be independent with advanced/ongoing HEP to improve outcomes and carryover.  Goal status: INITIAL  2.  Understand the need for safety with his transfers and all the steps to be as safe as possible Goal status: Ongoing 06/01/23  3.  Patient will demonstrate improved functional LE strength as demonstrated by < 25s on 5xSTS. Baseline: 83 seconds had to use hands and was unable to be independent with the standing Goal status: Progressing 06/01/23  4. Increase UE strength to 4+/5 for better transfers and function Goal status: Progressing  06/01/23  5.  Patient will report no falls in a 4 weeks period. Baseline: falls every few days Goal status: Ongoing 06/01/23 fall last "20 or 21" of Feburary   PLAN:  PT FREQUENCY: 2x/week  PT DURATION: 12  weeks  PLANNED INTERVENTIONS: Therapeutic exercises, Therapeutic activity, Neuromuscular re-education, Balance training, Gait training, Patient/Family education, Self Care, Joint mobilization, Stair training, Cryotherapy, Moist heat, and Manual therapy.  PLAN FOR NEXT SESSION: needs total body strength to help with his safety in transfers and independence.   Grayce Sessions, PTA

## 2023-06-09 ENCOUNTER — Ambulatory Visit: Payer: Medicare Other | Admitting: Physical Therapy

## 2023-06-09 ENCOUNTER — Encounter: Payer: Self-pay | Admitting: Physical Therapy

## 2023-06-09 DIAGNOSIS — M6281 Muscle weakness (generalized): Secondary | ICD-10-CM

## 2023-06-09 DIAGNOSIS — M6249 Contracture of muscle, multiple sites: Secondary | ICD-10-CM

## 2023-06-09 DIAGNOSIS — R2689 Other abnormalities of gait and mobility: Secondary | ICD-10-CM | POA: Diagnosis not present

## 2023-06-09 DIAGNOSIS — M25561 Pain in right knee: Secondary | ICD-10-CM

## 2023-06-09 DIAGNOSIS — R296 Repeated falls: Secondary | ICD-10-CM

## 2023-06-09 NOTE — Therapy (Signed)
 OUTPATIENT PHYSICAL THERAPY TREATMENT     Patient Name: Brandon Mendoza. MRN: [id number] DOB:[birth year], 83 y.o., male Today's Date: 06/09/2023  END OF SESSION:  PT End of Session - 06/09/23 0846     Visit Number 12    Date for PT Re-Evaluation 07/29/23    Authorization Type Medicare    PT Start Time 0845    PT Stop Time 0930    PT Time Calculation (min) 45 min    Activity Tolerance Patient tolerated treatment well    Behavior During Therapy [facility] for tasks assessed/performed;Impulsive             Past Medical History:  Diagnosis Date   Acquired clawfoot, right foot 12/16/2021   Anxiety    Atrophy of calf muscles 10/21/2021   Duplex calves was negative for significant blockage EMG was done at [facility] MRI lumbar spine was also done at [facility]   Carotid artery occlusion    CKD (chronic kidney disease)    Stage 3   COPD (chronic obstructive pulmonary disease) (HCC)    Depression    Dysrhythmia    PAF, atrial tachycardia   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   The spinal cord appears normal. 2.   No spinal stenosis. 3.   Mild multilevel degenerative changes as detailed above that do not lead to spinal stenosis or nerve root compression. 4.   T2 hyperintense foci within the pons consistent with chronic microvascular ischemic changes.   Loop - Medtronic Linq 10/22/2020 10/22/2020   Nocturia    Paroxysmal atrial fibrillation (HCC)    Peripheral neuropathy    Peripheral vascular disease (HCC)    Sleep apnea    on 6 cm, nasal pillow   Spinal stenosis    Stroke (HCC) 05/01/2011   ischemic   Weakness generalized 10/21/2021   Severe, legs and arms   Past Surgical History:  Procedure Laterality Date   CAROTID ENDARTERECTOMY  12/09/11    Left cea   ENDARTERECTOMY  12/09/2011   Procedure: ENDARTERECTOMY CAROTID;  Surgeon: Larina Earthly, MD;  Location: [facility] OR;  Service: Vascular;  Laterality: Left;   EYE SURGERY     rt retina detachment   HAMMER TOE SURGERY  2004   HERNIA REPAIR  2006   I & D EXTREMITY Right 10/07/2022   Procedure: IRRIGATION AND DEBRIDEMENT EXTREMITY WITH WOUND CLOSURE;  Surgeon: Venita Lick, MD;  Location: [facility];  Service: Orthopedics;  Laterality: Right;   QUADRICEPS TENDON REPAIR Right 07/12/2022   Procedure: REPAIR QUADRICEP TENDON;  Surgeon: Durene Romans, MD;  Location: [facility];  Service: Orthopedics;  Laterality: Right;   QUADRICEPS TENDON REPAIR Right 08/30/2022   Procedure: REPAIR QUADRICEP TENDON;  Surgeon: Durene Romans, MD;  Location: [facility];  Service: Orthopedics;  Laterality: Right;   SHOULDER ARTHROSCOPY W/ ROTATOR CUFF REPAIR  2010   TONSILLECTOMY     Patient Active Problem List   Diagnosis Date Noted   Coronary artery calcification 05/09/2023   Rupture of quadriceps tendon 11/03/2022   Wound dehiscence 10/07/2022   Quadriceps tendon rupture, right, subsequent encounter 08/30/2022   Quadriceps tendon rupture, right, initial encounter 07/12/2022   Unable to care for self 07/08/2022  Dehydration 07/08/2022   Depression 07/08/2022   COPD (chronic obstructive pulmonary disease) (HCC) 07/08/2022   Sialoadenitis of submandibular gland 04/28/2022   Macular degeneration 03/01/2022   Myofascial pain 02/10/2022   Lumbar spondylosis 01/03/2022   Acquired clawfoot, left foot 12/16/2021   Acquired clawfoot, right foot 12/16/2021   Claw foot 12/14/2021   Gait abnormality 12/10/2021   Spinal stenosis at L4-L5 level 12/10/2021   Ataxia 11/22/2021   Lightheadedness 11/02/2021   Claudication of lower extremity (HCC) 10/21/2021   Atrophy of calf muscles 10/21/2021   Weakness generalized 10/21/2021   Focal neurological deficit 10/21/2021   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   Memory impairment of gradual onset  10/21/2021   Pain in joint of right hip 09/14/2021   High arches 07/22/2021   Hammertoes of both feet 07/22/2021   Neuropathy 07/22/2021   Constipation 05/28/2021   Disequilibrium 05/28/2021   History of colonic polyps 05/28/2021   Renal cyst 05/28/2021   Gastroesophageal reflux disease without esophagitis 05/05/2021   Ingrowing left great toenail 02/15/2021   S/P lumbar fusion 12/24/2020   Spinal stenosis of lumbar region 12/04/2020   Acquired complex renal cyst 11/23/2020   Functional gait abnormality 11/04/2020   Idiopathic neuropathy 11/02/2020   Tinea pedis of both feet 10/28/2020   Loop - Medtronic Linq 10/22/2020 10/22/2020   Benign hypertension with 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) 10/05/2020   Fall with injury 10/05/2020   Hematoma 09/16/2020   Normocytic anemia 09/14/2020   Leukocytosis 09/14/2020   AF (paroxysmal atrial fibrillation) (HCC) 09/14/2020   Chronic anticoagulation 09/14/2020   Hyponatremia 09/14/2020   Pain in left foot 01/22/2020   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   Pain in joint of right knee 12/27/2019   Recurrent falls 11/17/2019   Paroxysmal atrial fibrillation (HCC) 10/22/2019   Pain in right foot 10/14/2019   Falls 10/07/2019   Ataxia involving legs 08/12/2019   OSA on CPAP 06/20/2018   Urge incontinence 04/13/2018   Urinary urgency 04/13/2018   Pain in left knee 03/01/2018   Other intervertebral disc degeneration, lumbar region 06/16/2017   Acquired bilateral hammer toes 10/05/2015   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   Generalized anxiety disorder 05/12/2015   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   History of left-sided carotid endarterectomy 04/10/2014   Essential hypertension 04/10/2014   Occlusion and stenosis of carotid artery without mention of cerebral infarction 12/15/2011   Habitual alcohol use  12/01/2011   Carotid artery stenosis, symptomatic 11/30/2011   Cerebral infarction (HCC) 11/29/2011   Mixed hyperlipidemia     PCP: Glenetta Hew  REFERRING PROVIDER: Glenetta Hew  REFERRING DIAG: weakness, multiple falls  Rationale for Evaluation and Treatment: Rehabilitation  THERAPY DIAG:  Other abnormalities of gait and mobility  Muscle weakness (generalized)  Acute pain of right knee  Contracture of muscle, multiple sites  Repeated falls  ONSET DATE: 10/07/22  SUBJECTIVE:  SUBJECTIVE STATEMENT: All right  PERTINENT HISTORY:  Cerebral infarct 10/21/21, lumbar fusion, recurrent falls, has significant neuropathy and this is the reason for most of his falls   PAIN:  Are you having pain? Yes: NPRS scale: 0/10 Pain location: right knee Pain description: ache  Aggravating factors: being on my feet  Relieving factors: movement  PRECAUTIONS: Fall  WEIGHT BEARING RESTRICTIONS: No  FALLS:  Has patient fallen in last 6 months? Yes. Number of falls every few days   LIVING ENVIRONMENT: Lives with: lives with their spouse Lives in: House/apartment Stairs: Yes: Internal: 20 steps; on right going up Has following equipment at home: Dan Humphreys - 4 wheeled, Wheelchair (manual), shower chair, Tour manager, Grab bars, Ramped entry, and trekking pole  OCCUPATION: Retired  PLOF: Independent, prior to a year ago he was driving used trekking poles to walk  PATIENT GOALS: live at home  NEXT MD VISIT:   OBJECTIVE:   DIAGNOSTIC FINDINGS:    LUMBAR SPINE FINDINGS: 1. Wide laminectomy and posterior pedicle screw and rod fixation at L4-5 without residual or recurrent stenosis. 2. Progressive adjacent level disease at L3-4 with moderate right and mild left subarticular narrowing and moderate  foraminal stenosis bilaterally. Progression is predominantly due to facet disease 3. Progressive moderate facet hypertrophy at L1-2 with a progressive rightward disc protrusion resulting in progressive moderate right foraminal stenosis. Asymmetric endplate marrow edema on the right at L1-2 is consistent with progressive disease as well. 4. Otherwise stable degenerative change without other focal stenosis.   THORACIC SPINE FINDINGS:  On sagittal views the vertebral bodies have normal height and alignment.  Hemangioma within T9 vertebral body and slightly within T10 vertebral body.  Anterior kyphosis.  Mild scoliosis convex right centered at T8 level. The spinal cord is normal in size and appearance. The paraspinal soft tissues are unremarkable.     On axial views there is no spinal stenosis or foraminal narrowing.  Limited views of the aorta, kidneys, liver, lungs and paraspinal muscles are notable for multiple large right renal cysts ranging in size from 1.5 to 7 cm.   COGNITION: Overall cognitive status: Within functional limits for tasks assessed     SENSATION: WFL  MUSCLE LENGTH: Hamstrings: mod tightness on both sides  POSTURE: rounded shoulders and forward head  LOWER EXTREMITY ROM:   the right knee has HS, has no quad tendon on the right, unable to lift the leg, left leg WFL's , left hip and right hip and ankles WFL's   LOWER EXTREMITY MMT: 4-/5 for the LE's except for the right knee extension UPPER EXTREMITY MMT:  4-/5 for shoulders and elbows   FUNCTIONAL TESTS:  5XSTS: 83 seconds really has to use his hands and is unsteady with standing Cannot stand on his own without holding onto something due to poor balance   Transfers:  needs set up and MinA for safety, is impulsive and unsafe at times. GAIT: He is w/c dependent    TODAY'S TREATMENT:  DATE:  06/09/23 NuStep L 5 x 7 min, x4 min LE only 15# in w/c straight arm pulls 15# rows 10# chest press At Mat table   4lb OHP   HS Curle green 2x10  LAQ 5lb 2x10  Seated trunk rotations blue ball 3x10  06/07/23 NuStep L 5 x 7 min, x4 min LE only 15# in w/c straight arm pulls 15# rows 10# chest press 20# biceps curls Leg press 40# 2x15 AT Mat table   4lb OHP   Crunches   Horiz shoulder Abd green 2x10  Seated march no UE support  06/01/23 At [facility] table 5XSTS to The back of WC: 64 seconds really has to use his hands and is unsteady with standing Cannot stand on his own without holding onto something due to not having a L quad tendon MMT RUE flex 4/5 abd 4-/5, LUE Flex 4/5, Abd 4/5 Seated military press 5lb 2x10 Seated rows blue 2x15 Horiz abd green 2x15 HS curls green x15, blue x 15  Seated crunches 2x15 Nustep level 6 10 minutes 3 minutes with LE's only  05/29/23 Bike level 5 x 6 minutes 15# in w/c straight arm pulls 15# rows 10# chest press 20# biceps curls 10# overhead press 55# leg curls both 2x10, right only 25# 2x10 Leg extension left only 10# 2x10 LEg press 40# 2x10, then single legs 2x10, right leg was eccentrics Pball in lap isometric abs Nustep level 6 LE only 2x10  05/25/23 Nustep level 6 10 minutes 3 minutes with LE's only In pbars standing marches with PT clocking knees In pbars standing balance with PT close to knees if they give Leg curls 55# 2x10 both legs, right leg only 25# 2 x10 LEg extension left only 10# 3x10 15# straight arm pulls 15# rows 10# chest press Biceps 15# and 20# 2x10 10# overhead press  05/22/23 Nustep level 6 x 10 minute, 3 without UE Seated Crunches 2x15 Seated weight overhead ball toss 2x15 LAQ LLE 5lb 2x15 Seated march 5lb 2x10 HS curls blue 2x15 Seated blue ball OHP 3x10 Horiz abd blue 3x10 Shoulder Ext 10lb 2x15  05/17/23 Nustep level 6 x 10 minute, 3 without UE 10# straight arm pulls in w/c 3x10 10# chest  press in w/c 3x10 Shoulder abd 3lb x10, 1lb x10 Leg press 50# 3x10, Heel raises 50lb 3x10 PTA blocking R knee Seated Ab sets  Seated Pball roll outs for lower back stretch   05/15/23 Nustep level 6 x 13 minute, 3 without UE 10# straight arm pulls in w/c 3x10 10# chest press in w/c 3x10 Leg press 40# 3x10 Standing hip Flex And Ext  in RW x10 eaxh PTA blocking RLE to prevent buckling Seated on Mat OHP 12lb WaTE 2x12 Seated Trunk rotations Blue 2x12   05/12/23 Nustep level 6 x 10 minutes, 3.5 minutes without arms STS in // bars 7 x working on full ext of knees esp left cg A Static standing in // bars with wt shifts CGA and cuing Standing in // bars with PTA supporting LLE and RT LE stepping fwd 10x then laterally 10 x Leg press 40# 3x10- cued for full TKE and slower controlled speed 10# straight arm pulls in w/c 2 x 15 Blue tband chest press in w/c 3x10 then flys 10# biceps curls in w/c 2 sets 15 Leg curls 35# 10x , 45# 2 sets 10- cued for full ROM Seated OH ball press 2 sets 10 - blue ball Seated blue ball core work  05/10/23 Nustep level 6 x 10 minutes, 2 minutes without arms 10# straight arm pulls in w/c 3x10 10# chest press in w/c 3x10 10# biceps curls in w/c Leg press 40# 3x10 Leg curls 35# 3x10 Green tband HS curls Green tband ankle all motions Transfers to and from surfaces and machines require CGA to Max A due to safety and the legs buckling  05/04/23 NuStep L6 x 10 min Horiz shoulder abd green 2x15 Hip add ball squeeze 2x15 Hip abd green x10, blue x 10  LAQ LLE 3lb 2x15 Seated March 3lb x10, x15 each HS curls blue 2x15 Seated OHP blue ball 2x10   05/01/23 Eval   PATIENT EDUCATION:  Education details: POC Person educated: Patient Education method: Explanation Education comprehension: verbalized understanding  HOME EXERCISE PROGRAM: Will prescribe over the next few visits  ASSESSMENT:  CLINICAL IMPRESSION: Continued to work with patient on total body  strength, conditioning and safety. He is unsafe not wanting assistance. Pt is impulsive and unsafe with transfers despite multiple cues.  Pt only feel comfortable transferring clockwise. CG- mid A with transfers today. No reports of pain during session. Cue needed for pacing at times not to complete reps too fast.   Patient is a 83 y.o. male who was seen today for physical therapy evaluation and treatment for repeated falls and balance issues, he has had 3 quadriceps tendon ruptures in the past year, his last rupture was not repaired so he has no active right knee extension.  He reports repeated falls, falling every few days. Pt I very impulsive and unsafe with transfers, he is no longer walking and is w/c dependent.   He will benefit from skilled PT to decrease his risk for falls and address his overall strength, function and safety   OBJECTIVE IMPAIRMENTS: Abnormal gait, cardiopulmonary status limiting activity, decreased activity tolerance, decreased balance, decreased coordination, decreased endurance, decreased mobility, difficulty walking, decreased ROM, decreased strength, improper body mechanics, postural dysfunction, and poor safety .   REHAB POTENTIAL: Fair dependent on carry over and patient compliance  CLINICAL DECISION MAKING: Evolving/moderate complexity  EVALUATION COMPLEXITY: Moderate  GOALS: Goals reviewed with patient? Yes  SHORT TERM GOALS: Target date: 05/29/23  Patient will be independent with initial HEP. Goal status: Met  05/10/23  LONG TERM GOALS: Target date: 07/29/23  Patient will be independent with advanced/ongoing HEP to improve outcomes and carryover.  Goal status: INITIAL  2.  Understand the need for safety with his transfers and all the steps to be as safe as possible Goal status: Ongoing 06/01/23  3.  Patient will demonstrate improved functional LE strength as demonstrated by < 25s on 5xSTS. Baseline: 83 seconds had to use hands and was unable to be independent  with the standing Goal status: Progressing 06/01/23  4. Increase UE strength to 4+/5 for better transfers and function Goal status: Progressing  06/01/23  5.  Patient will report no falls in a 4 weeks period. Baseline: falls every few days Goal status: Ongoing 06/01/23 fall last "20 or 21" of Feburary   PLAN:  PT FREQUENCY: 2x/week  PT DURATION: 12 weeks  PLANNED INTERVENTIONS: Therapeutic exercises, Therapeutic activity, Neuromuscular re-education, Balance training, Gait training, Patient/Family education, Self Care, Joint mobilization, Stair training, Cryotherapy, Moist heat, and Manual therapy.  PLAN FOR NEXT SESSION: needs total body strength to help with his safety in transfers and independence.   Grayce Sessions, PTA

## 2023-06-12 ENCOUNTER — Ambulatory Visit: Payer: Medicare Other | Admitting: Physical Therapy

## 2023-06-12 ENCOUNTER — Encounter: Payer: Self-pay | Admitting: Physical Therapy

## 2023-06-12 DIAGNOSIS — R2689 Other abnormalities of gait and mobility: Secondary | ICD-10-CM

## 2023-06-12 DIAGNOSIS — M6281 Muscle weakness (generalized): Secondary | ICD-10-CM

## 2023-06-12 DIAGNOSIS — M6249 Contracture of muscle, multiple sites: Secondary | ICD-10-CM

## 2023-06-12 DIAGNOSIS — R296 Repeated falls: Secondary | ICD-10-CM

## 2023-06-12 DIAGNOSIS — M25561 Pain in right knee: Secondary | ICD-10-CM

## 2023-06-12 NOTE — Therapy (Signed)
 OUTPATIENT PHYSICAL THERAPY TREATMENT     Patient Name: Brandon Mendoza. MRN: [id number] DOB:[birth year], 83 y.o., male Today's Date: 06/12/2023  END OF SESSION:  PT End of Session - 06/12/23 0848     Visit Number 13    Date for PT Re-Evaluation 07/29/23    PT Start Time 0845    PT Stop Time 0930    PT Time Calculation (min) 45 min    Activity Tolerance Patient tolerated treatment well    Behavior During Therapy [facility] for tasks assessed/performed;Impulsive             Past Medical History:  Diagnosis Date   Acquired clawfoot, right foot 12/16/2021   Anxiety    Atrophy of calf muscles 10/21/2021   Duplex calves was negative for significant blockage EMG was done at [facility] MRI lumbar spine was also done at [facility]   Carotid artery occlusion    CKD (chronic kidney disease)    Stage 3   COPD (chronic obstructive pulmonary disease) (HCC)    Depression    Dysrhythmia    PAF, atrial tachycardia   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   The spinal cord appears normal. 2.   No spinal stenosis. 3.   Mild multilevel degenerative changes as detailed above that do not lead to spinal stenosis or nerve root compression. 4.   T2 hyperintense foci within the pons consistent with chronic microvascular ischemic changes.   Loop - Medtronic Linq 10/22/2020 10/22/2020   Nocturia    Paroxysmal atrial fibrillation (HCC)    Peripheral neuropathy    Peripheral vascular disease (HCC)    Sleep apnea    on 6 cm, nasal pillow   Spinal stenosis    Stroke (HCC) 05/01/2011   ischemic   Weakness generalized 10/21/2021   Severe, legs and arms   Past Surgical History:  Procedure Laterality Date   CAROTID ENDARTERECTOMY  12/09/11   Left cea   ENDARTERECTOMY   12/09/2011   Procedure: ENDARTERECTOMY CAROTID;  Surgeon: Larina Earthly, MD;  Location: [facility] OR;  Service: Vascular;  Laterality: Left;   EYE SURGERY     rt retina detachment   HAMMER TOE SURGERY  2004   HERNIA REPAIR  2006   I & D EXTREMITY Right 10/07/2022   Procedure: IRRIGATION AND DEBRIDEMENT EXTREMITY WITH WOUND CLOSURE;  Surgeon: Venita Lick, MD;  Location: [facility] ORS;  Service: Orthopedics;  Laterality: Right;   QUADRICEPS TENDON REPAIR Right 07/12/2022   Procedure: REPAIR QUADRICEP TENDON;  Surgeon: Durene Romans, MD;  Location: [facility] ORS;  Service: Orthopedics;  Laterality: Right;   QUADRICEPS TENDON REPAIR Right 08/30/2022   Procedure: REPAIR QUADRICEP TENDON;  Surgeon: Durene Romans, MD;  Location: [facility] ORS;  Service: Orthopedics;  Laterality: Right;   SHOULDER ARTHROSCOPY W/ ROTATOR CUFF REPAIR  2010   TONSILLECTOMY     Patient Active Problem List   Diagnosis Date Noted   Coronary artery calcification 05/09/2023   Rupture of quadriceps tendon 11/03/2022   Wound dehiscence 10/07/2022   Quadriceps tendon rupture, right, subsequent encounter 08/30/2022   Quadriceps tendon rupture, right, initial encounter 07/12/2022   Unable to care for self 07/08/2022   Dehydration 07/08/2022   Depression 07/08/2022  COPD (chronic obstructive pulmonary disease) (HCC) 07/08/2022   Sialoadenitis of submandibular gland 04/28/2022   Macular degeneration 03/01/2022   Myofascial pain 02/10/2022   Lumbar spondylosis 01/03/2022   Acquired clawfoot, left foot 12/16/2021   Acquired clawfoot, right foot 12/16/2021   Claw foot 12/14/2021   Gait abnormality 12/10/2021   Spinal stenosis at L4-L5 level 12/10/2021   Ataxia 11/22/2021   Lightheadedness 11/02/2021   Claudication of lower extremity (HCC) 10/21/2021   Atrophy of calf muscles 10/21/2021   Weakness generalized 10/21/2021   Focal neurological deficit 10/21/2021   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   Pain in joint of  right hip 09/14/2021   High arches 07/22/2021   Hammertoes of both feet 07/22/2021   Neuropathy 07/22/2021   Constipation 05/28/2021   Disequilibrium 05/28/2021   History of colonic polyps 05/28/2021   Renal cyst 05/28/2021   Gastroesophageal reflux disease without esophagitis 05/05/2021   Ingrowing left great toenail 02/15/2021   S/P lumbar fusion 12/24/2020   Spinal stenosis of lumbar region 12/04/2020   Acquired complex renal cyst 11/23/2020   Functional gait abnormality 11/04/2020   Idiopathic neuropathy 11/02/2020   Tinea pedis of both feet 10/28/2020   Loop - Medtronic Linq 10/22/2020 10/22/2020   Benign hypertension with 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) 10/05/2020   Fall with injury 10/05/2020   Hematoma 09/16/2020   Normocytic anemia 09/14/2020   Leukocytosis 09/14/2020   AF (paroxysmal atrial fibrillation) (HCC) 09/14/2020   Chronic anticoagulation 09/14/2020   Hyponatremia 09/14/2020   Pain in left foot 01/22/2020   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   Pain in joint of right knee 12/27/2019   Recurrent falls 11/17/2019   Paroxysmal atrial fibrillation (HCC) 10/22/2019   Pain in right foot 10/14/2019   Falls 10/07/2019   Ataxia involving legs 08/12/2019   OSA on CPAP 06/20/2018   Urge incontinence 04/13/2018   Urinary urgency 04/13/2018   Pain in left knee 03/01/2018   Other intervertebral disc degeneration, lumbar region 06/16/2017   Acquired bilateral hammer toes 10/05/2015   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   Generalized anxiety disorder 05/12/2015   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   History of left-sided carotid endarterectomy 04/10/2014   Essential hypertension 04/10/2014   Occlusion and stenosis of carotid artery without mention of cerebral infarction 12/15/2011   Habitual alcohol use 12/01/2011   Carotid artery stenosis,  symptomatic 11/30/2011   Cerebral infarction (HCC) 11/29/2011   Mixed hyperlipidemia     PCP: Glenetta Hew  REFERRING PROVIDER: Glenetta Hew  REFERRING DIAG: weakness, multiple falls  Rationale for Evaluation and Treatment: Rehabilitation  THERAPY DIAG:  Other abnormalities of gait and mobility  Acute pain of right knee  Muscle weakness (generalized)  Contracture of muscle, multiple sites  Repeated falls  ONSET DATE: 10/07/22  SUBJECTIVE:  SUBJECTIVE STATEMENT: Left leg is very Achy and sore when he moves it,  pt points to the lateral shaft   PERTINENT HISTORY:  Cerebral infarct 10/21/21, lumbar fusion, recurrent falls, has significant neuropathy and this is the reason for most of his falls   PAIN:  Are you having pain? Yes: NPRS scale: 0/10 Pain location: right knee Pain description: ache  Aggravating factors: being on my feet  Relieving factors: movement  PRECAUTIONS: Fall  WEIGHT BEARING RESTRICTIONS: No  FALLS:  Has patient fallen in last 6 months? Yes. Number of falls every few days   LIVING ENVIRONMENT: Lives with: lives with their spouse Lives in: House/apartment Stairs: Yes: Internal: 20 steps; on right going up Has following equipment at home: Dan Humphreys - 4 wheeled, Wheelchair (manual), shower chair, Tour manager, Grab bars, Ramped entry, and trekking pole  OCCUPATION: Retired  PLOF: Independent, prior to a year ago he was driving used trekking poles to walk  PATIENT GOALS: live at home  NEXT MD VISIT:   OBJECTIVE:   DIAGNOSTIC FINDINGS:    LUMBAR SPINE FINDINGS: 1. Wide laminectomy and posterior pedicle screw and rod fixation at L4-5 without residual or recurrent stenosis. 2. Progressive adjacent level disease at L3-4 with moderate right and mild left  subarticular narrowing and moderate foraminal stenosis bilaterally. Progression is predominantly due to facet disease 3. Progressive moderate facet hypertrophy at L1-2 with a progressive rightward disc protrusion resulting in progressive moderate right foraminal stenosis. Asymmetric endplate marrow edema on the right at L1-2 is consistent with progressive disease as well. 4. Otherwise stable degenerative change without other focal stenosis.   THORACIC SPINE FINDINGS:  On sagittal views the vertebral bodies have normal height and alignment.  Hemangioma within T9 vertebral body and slightly within T10 vertebral body.  Anterior kyphosis.  Mild scoliosis convex right centered at T8 level. The spinal cord is normal in size and appearance. The paraspinal soft tissues are unremarkable.     On axial views there is no spinal stenosis or foraminal narrowing.  Limited views of the aorta, kidneys, liver, lungs and paraspinal muscles are notable for multiple large right renal cysts ranging in size from 1.5 to 7 cm.   COGNITION: Overall cognitive status: Within functional limits for tasks assessed     SENSATION: WFL  MUSCLE LENGTH: Hamstrings: mod tightness on both sides  POSTURE: rounded shoulders and forward head  LOWER EXTREMITY ROM:   the right knee has HS, has no quad tendon on the right, unable to lift the leg, left leg WFL's , left hip and right hip and ankles WFL's   LOWER EXTREMITY MMT: 4-/5 for the LE's except for the right knee extension UPPER EXTREMITY MMT:  4-/5 for shoulders and elbows   FUNCTIONAL TESTS:  5XSTS: 83 seconds really has to use his hands and is unsteady with standing Cannot stand on his own without holding onto something due to poor balance   Transfers:  needs set up and MinA for safety, is impulsive and unsafe at times. GAIT: He is w/c dependent    TODAY'S TREATMENT:  DATE:  06/12/23 NuStep L 5 x 7 min, x3 min LE only At Mat table   STM w/ thea gun to L lateral thigh  Blue ball  OHP 2x15  HS Curl green LLE x10, RLE 2x10  Horiz abd blue 2x10  LAQ LLE 5lb  2x15  Seated trunk rotations blue ball 2x15 06/09/23 NuStep L 5 x 7 min, x4 min LE only 15# in w/c straight arm pulls 15# rows 10# chest press At Mat table   4lb OHP   HS Curle green 2x10  LAQ 5lb 2x10  Seated trunk rotations blue ball 3x10  06/07/23 NuStep L 5 x 7 min, x4 min LE only 15# in w/c straight arm pulls 15# rows 10# chest press 20# biceps curls Leg press 40# 2x15 AT Mat table   4lb OHP   Crunches   Horiz shoulder Abd green 2x10  Seated march no UE support  06/01/23 At Mat table 5XSTS to The back of WC: 64 seconds really has to use his hands and is unsteady with standing Cannot stand on his own without holding onto something due to not having a L quad tendon MMT RUE flex 4/5 abd 4-/5, LUE Flex 4/5, Abd 4/5 Seated military press 5lb 2x10 Seated rows blue 2x15 Horiz abd green 2x15 HS curls green x15, blue x 15  Seated crunches 2x15 Nustep level 6 10 minutes 3 minutes with LE's only  05/29/23 Bike level 5 x 6 minutes 15# in w/c straight arm pulls 15# rows 10# chest press 20# biceps curls 10# overhead press 55# leg curls both 2x10, right only 25# 2x10 Leg extension left only 10# 2x10 LEg press 40# 2x10, then single legs 2x10, right leg was eccentrics Pball in lap isometric abs Nustep level 6 LE only 2x10  05/25/23 Nustep level 6 10 minutes 3 minutes with LE's only In pbars standing marches with PT clocking knees In pbars standing balance with PT close to knees if they give Leg curls 55# 2x10 both legs, right leg only 25# 2 x10 LEg extension left only 10# 3x10 15# straight arm pulls 15# rows 10# chest press Biceps 15# and 20# 2x10 10# overhead press  05/22/23 Nustep level 6 x 10 minute, 3 without UE Seated Crunches 2x15 Seated  weight overhead ball toss 2x15 LAQ LLE 5lb 2x15 Seated march 5lb 2x10 HS curls blue 2x15 Seated blue ball OHP 3x10 Horiz abd blue 3x10 Shoulder Ext 10lb 2x15  05/17/23 Nustep level 6 x 10 minute, 3 without UE 10# straight arm pulls in w/c 3x10 10# chest press in w/c 3x10 Shoulder abd 3lb x10, 1lb x10 Leg press 50# 3x10, Heel raises 50lb 3x10 PTA blocking R knee Seated Ab sets  Seated Pball roll outs for lower back stretch   05/15/23 Nustep level 6 x 13 minute, 3 without UE 10# straight arm pulls in w/c 3x10 10# chest press in w/c 3x10 Leg press 40# 3x10 Standing hip Flex And Ext  in RW x10 eaxh PTA blocking RLE to prevent buckling Seated on Mat OHP 12lb WaTE 2x12 Seated Trunk rotations Blue 2x12   05/12/23 Nustep level 6 x 10 minutes, 3.5 minutes without arms STS in // bars 7 x working on full ext of knees esp left cg A Static standing in // bars with wt shifts CGA and cuing Standing in // bars with PTA supporting LLE and RT LE stepping fwd 10x then laterally 10 x Leg press 40# 3x10- cued for full TKE and slower controlled speed  10# straight arm pulls in w/c 2 x 15 Blue tband chest press in w/c 3x10 then flys 10# biceps curls in w/c 2 sets 15 Leg curls 35# 10x , 45# 2 sets 10- cued for full ROM Seated OH ball press 2 sets 10 - blue ball Seated blue ball core work   05/10/23 Nustep level 6 x 10 minutes, 2 minutes without arms 10# straight arm pulls in w/c 3x10 10# chest press in w/c 3x10 10# biceps curls in w/c Leg press 40# 3x10 Leg curls 35# 3x10 Green tband HS curls Green tband ankle all motions Transfers to and from surfaces and machines require CGA to Max A due to safety and the legs buckling  05/04/23 NuStep L6 x 10 min Horiz shoulder abd green 2x15 Hip add ball squeeze 2x15 Hip abd green x10, blue x 10  LAQ LLE 3lb 2x15 Seated March 3lb x10, x15 each HS curls blue 2x15 Seated OHP blue ball 2x10   05/01/23 Eval   PATIENT EDUCATION:  Education details:  POC Person educated: Patient Education method: Explanation Education comprehension: verbalized understanding  HOME EXERCISE PROGRAM: Will prescribe over the next few visits  ASSESSMENT:  CLINICAL IMPRESSION: Continued to work with patient on total body strength, conditioning and safety. He is unsafe not wanting assistance. Pt is impulsive and unsafe with transfers despite multiple cues. He reports increase L leg pain today with movement so attempted to limit stresses on L leg.  Pt only feel comfortable transferring clockwise. CG- mid A with transfers today.  Cue needed for pacing at times not to complete reps too fast. Session completed seated at mat without back support  Patient is a 83 y.o. male who was seen today for physical therapy evaluation and treatment for repeated falls and balance issues, he has had 3 quadriceps tendon ruptures in the past year, his last rupture was not repaired so he has no active right knee extension.  He reports repeated falls, falling every few days. Pt I very impulsive and unsafe with transfers, he is no longer walking and is w/c dependent.   He will benefit from skilled PT to decrease his risk for falls and address his overall strength, function and safety   OBJECTIVE IMPAIRMENTS: Abnormal gait, cardiopulmonary status limiting activity, decreased activity tolerance, decreased balance, decreased coordination, decreased endurance, decreased mobility, difficulty walking, decreased ROM, decreased strength, improper body mechanics, postural dysfunction, and poor safety .   REHAB POTENTIAL: Fair dependent on carry over and patient compliance  CLINICAL DECISION MAKING: Evolving/moderate complexity  EVALUATION COMPLEXITY: Moderate  GOALS: Goals reviewed with patient? Yes  SHORT TERM GOALS: Target date: 05/29/23  Patient will be independent with initial HEP. Goal status: Met  05/10/23  LONG TERM GOALS: Target date: 07/29/23  Patient will be independent with  advanced/ongoing HEP to improve outcomes and carryover.  Goal status: INITIAL  2.  Understand the need for safety with his transfers and all the steps to be as safe as possible Goal status: Ongoing 06/01/23  3.  Patient will demonstrate improved functional LE strength as demonstrated by < 25s on 5xSTS. Baseline: 83 seconds had to use hands and was unable to be independent with the standing Goal status: Progressing 06/01/23  4. Increase UE strength to 4+/5 for better transfers and function Goal status: Progressing  06/01/23  5.  Patient will report no falls in a 4 weeks period. Baseline: falls every few days Goal status: Ongoing 06/01/23 fall last "20 or 21" of Feburary   PLAN:  PT FREQUENCY: 2x/week  PT DURATION: 12 weeks  PLANNED INTERVENTIONS: Therapeutic exercises, Therapeutic activity, Neuromuscular re-education, Balance training, Gait training, Patient/Family education, Self Care, Joint mobilization, Stair training, Cryotherapy, Moist heat, and Manual therapy.  PLAN FOR NEXT SESSION: needs total body strength to help with his safety in transfers and independence.   Grayce Sessions, PTA

## 2023-06-14 ENCOUNTER — Ambulatory Visit: Payer: Medicare Other | Admitting: Physical Therapy

## 2023-06-20 ENCOUNTER — Ambulatory Visit: Payer: Medicare Other | Admitting: Physical Therapy

## 2023-06-20 ENCOUNTER — Encounter: Payer: Self-pay | Admitting: Physical Therapy

## 2023-06-20 DIAGNOSIS — M25561 Pain in right knee: Secondary | ICD-10-CM

## 2023-06-20 DIAGNOSIS — M6281 Muscle weakness (generalized): Secondary | ICD-10-CM

## 2023-06-20 DIAGNOSIS — M6249 Contracture of muscle, multiple sites: Secondary | ICD-10-CM

## 2023-06-20 DIAGNOSIS — R2689 Other abnormalities of gait and mobility: Secondary | ICD-10-CM

## 2023-06-20 NOTE — Therapy (Signed)
 OUTPATIENT PHYSICAL THERAPY TREATMENT     Patient Name: Brandon Mendoza. MRN: [id number] DOB:05-Oct-1940, 83 y.o., male Today's Date: 06/20/2023  END OF SESSION:  PT End of Session - 06/20/23 0844     Visit Number 14    Date for PT Re-Evaluation 07/29/23    PT Start Time 0844    PT Stop Time 0930    PT Time Calculation (min) 46 min    Activity Tolerance Patient tolerated treatment well    Behavior During Therapy [facility] for tasks assessed/performed;Impulsive             Past Medical History:  Diagnosis Date   Acquired clawfoot, right foot 12/16/2021   Anxiety    Atrophy of calf muscles 10/21/2021   Duplex calves was negative for significant blockage EMG was done at [facility] MRI lumbar spine was also done at [facility]   Carotid artery occlusion    CKD (chronic kidney disease)    Stage 3   COPD (chronic obstructive pulmonary disease) (HCC)    Depression    Dysrhythmia    PAF, atrial tachycardia   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   The spinal cord appears normal. 2.   No spinal stenosis. 3.   Mild multilevel degenerative changes as detailed above that do not lead to spinal stenosis or nerve root compression. 4.   T2 hyperintense foci within the pons consistent with chronic microvascular ischemic changes.   Loop - Medtronic Linq 10/22/2020 10/22/2020   Nocturia    Paroxysmal atrial fibrillation (HCC)    Peripheral neuropathy    Peripheral vascular disease (HCC)    Sleep apnea    on 6 cm, nasal pillow   Spinal stenosis    Stroke (HCC) 05/01/2011   ischemic   Weakness generalized 10/21/2021   Severe, legs and arms   Past Surgical History:  Procedure Laterality Date   CAROTID ENDARTERECTOMY  12/09/11   Left cea   ENDARTERECTOMY   12/09/2011   Procedure: ENDARTERECTOMY CAROTID;  Surgeon: Larina Earthly, MD;  Location: [facility] OR;  Service: Vascular;  Laterality: Left;   EYE SURGERY     rt retina detachment   HAMMER TOE SURGERY  2004   HERNIA REPAIR  2006   I & D EXTREMITY Right 10/07/2022   Procedure: IRRIGATION AND DEBRIDEMENT EXTREMITY WITH WOUND CLOSURE;  Surgeon: Venita Lick, MD;  Location: [facility] ORS;  Service: Orthopedics;  Laterality: Right;   QUADRICEPS TENDON REPAIR Right 07/12/2022   Procedure: REPAIR QUADRICEP TENDON;  Surgeon: Durene Romans, MD;  Location: [facility] ORS;  Service: Orthopedics;  Laterality: Right;   QUADRICEPS TENDON REPAIR Right 08/30/2022   Procedure: REPAIR QUADRICEP TENDON;  Surgeon: Durene Romans, MD;  Location: [facility] ORS;  Service: Orthopedics;  Laterality: Right;   SHOULDER ARTHROSCOPY W/ ROTATOR CUFF REPAIR  2010   TONSILLECTOMY     Patient Active Problem List   Diagnosis Date Noted   Coronary artery calcification 05/09/2023   Rupture of quadriceps tendon 11/03/2022   Wound dehiscence 10/07/2022   Quadriceps tendon rupture, right, subsequent encounter 08/30/2022   Quadriceps tendon rupture, right, initial encounter 07/12/2022   Unable to care for self 07/08/2022   Dehydration 07/08/2022   Depression 07/08/2022  COPD (chronic obstructive pulmonary disease) (HCC) 07/08/2022   Sialoadenitis of submandibular gland 04/28/2022   Macular degeneration 03/01/2022   Myofascial pain 02/10/2022   Lumbar spondylosis 01/03/2022   Acquired clawfoot, left foot 12/16/2021   Acquired clawfoot, right foot 12/16/2021   Claw foot 12/14/2021   Gait abnormality 12/10/2021   Spinal stenosis at L4-L5 level 12/10/2021   Ataxia 11/22/2021   Lightheadedness 11/02/2021   Claudication of lower extremity (HCC) 10/21/2021   Atrophy of calf muscles 10/21/2021   Weakness generalized 10/21/2021   Focal neurological deficit 10/21/2021   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   Pain in joint of  right hip 09/14/2021   High arches 07/22/2021   Hammertoes of both feet 07/22/2021   Neuropathy 07/22/2021   Constipation 05/28/2021   Disequilibrium 05/28/2021   History of colonic polyps 05/28/2021   Renal cyst 05/28/2021   Gastroesophageal reflux disease without esophagitis 05/05/2021   Ingrowing left great toenail 02/15/2021   S/P lumbar fusion 12/24/2020   Spinal stenosis of lumbar region 12/04/2020   Acquired complex renal cyst 11/23/2020   Functional gait abnormality 11/04/2020   Idiopathic neuropathy 11/02/2020   Tinea pedis of both feet 10/28/2020   Loop - Medtronic Linq 10/22/2020 10/22/2020   Benign hypertension with 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) 10/05/2020   Fall with injury 10/05/2020   Hematoma 09/16/2020   Normocytic anemia 09/14/2020   Leukocytosis 09/14/2020   AF (paroxysmal atrial fibrillation) (HCC) 09/14/2020   Chronic anticoagulation 09/14/2020   Hyponatremia 09/14/2020   Pain in left foot 01/22/2020   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   Pain in joint of right knee 12/27/2019   Recurrent falls 11/17/2019   Paroxysmal atrial fibrillation (HCC) 10/22/2019   Pain in right foot 10/14/2019   Falls 10/07/2019   Ataxia involving legs 08/12/2019   OSA on CPAP 06/20/2018   Urge incontinence 04/13/2018   Urinary urgency 04/13/2018   Pain in left knee 03/01/2018   Other intervertebral disc degeneration, lumbar region 06/16/2017   Acquired bilateral hammer toes 10/05/2015   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   Generalized anxiety disorder 05/12/2015   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   History of left-sided carotid endarterectomy 04/10/2014   Essential hypertension 04/10/2014   Occlusion and stenosis of carotid artery without mention of cerebral infarction 12/15/2011   Habitual alcohol use 12/01/2011   Carotid artery stenosis,  symptomatic 11/30/2011   Cerebral infarction (HCC) 11/29/2011   Mixed hyperlipidemia     PCP: Glenetta Hew  REFERRING PROVIDER: Glenetta Hew  REFERRING DIAG: weakness, multiple falls  Rationale for Evaluation and Treatment: Rehabilitation  THERAPY DIAG:  Other abnormalities of gait and mobility  Acute pain of right knee  Muscle weakness (generalized)  Contracture of muscle, multiple sites  ONSET DATE: 10/07/22  SUBJECTIVE:  SUBJECTIVE STATEMENT: Left leg soreness has improved, has been letting it rest. No pain and no falls  PERTINENT HISTORY:  Cerebral infarct 10/21/21, lumbar fusion, recurrent falls, has significant neuropathy and this is the reason for most of his falls   PAIN:  Are you having pain? Yes: NPRS scale: 0/10 Pain location: right knee Pain description: ache  Aggravating factors: being on my feet  Relieving factors: movement  PRECAUTIONS: Fall  WEIGHT BEARING RESTRICTIONS: No  FALLS:  Has patient fallen in last 6 months? Yes. Number of falls every few days   LIVING ENVIRONMENT: Lives with: lives with their spouse Lives in: House/apartment Stairs: Yes: Internal: 20 steps; on right going up Has following equipment at home: Dan Humphreys - 4 wheeled, Wheelchair (manual), shower chair, Tour manager, Grab bars, Ramped entry, and trekking pole  OCCUPATION: Retired  PLOF: Independent, prior to a year ago he was driving used trekking poles to walk  PATIENT GOALS: live at home  NEXT MD VISIT:   OBJECTIVE:   DIAGNOSTIC FINDINGS:    LUMBAR SPINE FINDINGS: 1. Wide laminectomy and posterior pedicle screw and rod fixation at L4-5 without residual or recurrent stenosis. 2. Progressive adjacent level disease at L3-4 with moderate right and mild left subarticular narrowing and  moderate foraminal stenosis bilaterally. Progression is predominantly due to facet disease 3. Progressive moderate facet hypertrophy at L1-2 with a progressive rightward disc protrusion resulting in progressive moderate right foraminal stenosis. Asymmetric endplate marrow edema on the right at L1-2 is consistent with progressive disease as well. 4. Otherwise stable degenerative change without other focal stenosis.   THORACIC SPINE FINDINGS:  On sagittal views the vertebral bodies have normal height and alignment.  Hemangioma within T9 vertebral body and slightly within T10 vertebral body.  Anterior kyphosis.  Mild scoliosis convex right centered at T8 level. The spinal cord is normal in size and appearance. The paraspinal soft tissues are unremarkable.     On axial views there is no spinal stenosis or foraminal narrowing.  Limited views of the aorta, kidneys, liver, lungs and paraspinal muscles are notable for multiple large right renal cysts ranging in size from 1.5 to 7 cm.   COGNITION: Overall cognitive status: Within functional limits for tasks assessed     SENSATION: WFL  MUSCLE LENGTH: Hamstrings: mod tightness on both sides  POSTURE: rounded shoulders and forward head  LOWER EXTREMITY ROM:   the right knee has HS, has no quad tendon on the right, unable to lift the leg, left leg WFL's , left hip and right hip and ankles WFL's   LOWER EXTREMITY MMT: 4-/5 for the LE's except for the right knee extension UPPER EXTREMITY MMT:  4-/5 for shoulders and elbows   FUNCTIONAL TESTS:  5XSTS: 83 seconds really has to use his hands and is unsteady with standing Cannot stand on his own without holding onto something due to poor balance   Transfers:  needs set up and MinA for safety, is impulsive and unsafe at times. GAIT: He is w/c dependent    TODAY'S TREATMENT:  DATE:  06/20/23 NuStep L 5 x 7 min, x4 min LE only RLE hip abd and flex in // bars 2x10 15# in w/c straight arm pulls 15# rows 15# biceps curls 10# chest press At Mat table  Seated trunk rotations blue ball 2x15  Blue ball  OHP 2x10  Horiz abd blue 2x10  06/12/23 NuStep L 5 x 7 min, x3 min LE only At Mat table   STM w/ thea gun to L lateral thigh  Blue ball  OHP 2x15  HS Curl green LLE x10, RLE 2x10  Horiz abd blue 2x10  LAQ LLE 5lb  2x15  Seated trunk rotations blue ball 2x15 06/09/23 NuStep L 5 x 7 min, x4 min LE only 15# in w/c straight arm pulls 15# rows 10# chest press At Mat table   4lb OHP   HS Curle green 2x10  LAQ 5lb 2x10  Seated trunk rotations blue ball 3x10  06/07/23 NuStep L 5 x 7 min, x4 min LE only 15# in w/c straight arm pulls 15# rows 10# chest press 20# biceps curls Leg press 40# 2x15 AT Mat table   4lb OHP   Crunches   Horiz shoulder Abd green 2x10  Seated march no UE support  06/01/23 At Mat table 5XSTS to The back of WC: 64 seconds really has to use his hands and is unsteady with standing Cannot stand on his own without holding onto something due to not having a L quad tendon MMT RUE flex 4/5 abd 4-/5, LUE Flex 4/5, Abd 4/5 Seated military press 5lb 2x10 Seated rows blue 2x15 Horiz abd green 2x15 HS curls green x15, blue x 15  Seated crunches 2x15 Nustep level 6 10 minutes 3 minutes with LE's only  05/29/23 Bike level 5 x 6 minutes 15# in w/c straight arm pulls 15# rows 10# chest press 20# biceps curls 10# overhead press 55# leg curls both 2x10, right only 25# 2x10 Leg extension left only 10# 2x10 LEg press 40# 2x10, then single legs 2x10, right leg was eccentrics Pball in lap isometric abs Nustep level 6 LE only 2x10  05/25/23 Nustep level 6 10 minutes 3 minutes with LE's only In pbars standing marches with PT clocking knees In pbars standing balance with PT close to knees if they give Leg curls 55# 2x10 both legs, right  leg only 25# 2 x10 LEg extension left only 10# 3x10 15# straight arm pulls 15# rows 10# chest press Biceps 15# and 20# 2x10 10# overhead press  05/22/23 Nustep level 6 x 10 minute, 3 without UE Seated Crunches 2x15 Seated weight overhead ball toss 2x15 LAQ LLE 5lb 2x15 Seated march 5lb 2x10 HS curls blue 2x15 Seated blue ball OHP 3x10 Horiz abd blue 3x10 Shoulder Ext 10lb 2x15  05/17/23 Nustep level 6 x 10 minute, 3 without UE 10# straight arm pulls in w/c 3x10 10# chest press in w/c 3x10 Shoulder abd 3lb x10, 1lb x10 Leg press 50# 3x10, Heel raises 50lb 3x10 PTA blocking R knee Seated Ab sets  Seated Pball roll outs for lower back stretch   05/15/23 Nustep level 6 x 13 minute, 3 without UE 10# straight arm pulls in w/c 3x10 10# chest press in w/c 3x10 Leg press 40# 3x10 Standing hip Flex And Ext  in RW x10 eaxh PTA blocking RLE to prevent buckling Seated on Mat OHP 12lb WaTE 2x12 Seated Trunk rotations Blue 2x12   05/12/23 Nustep level 6 x 10 minutes, 3.5 minutes without arms STS  in // bars 7 x working on full ext of knees esp left cg A Static standing in // bars with wt shifts CGA and cuing Standing in // bars with PTA supporting LLE and RT LE stepping fwd 10x then laterally 10 x Leg press 40# 3x10- cued for full TKE and slower controlled speed 10# straight arm pulls in w/c 2 x 15 Blue tband chest press in w/c 3x10 then flys 10# biceps curls in w/c 2 sets 15 Leg curls 35# 10x , 45# 2 sets 10- cued for full ROM Seated OH ball press 2 sets 10 - blue ball Seated blue ball core work   05/10/23 Nustep level 6 x 10 minutes, 2 minutes without arms 10# straight arm pulls in w/c 3x10 10# chest press in w/c 3x10 10# biceps curls in w/c Leg press 40# 3x10 Leg curls 35# 3x10 Green tband HS curls Green tband ankle all motions Transfers to and from surfaces and machines require CGA to Max A due to safety and the legs buckling  05/04/23 NuStep L6 x 10 min Horiz shoulder  abd green 2x15 Hip add ball squeeze 2x15 Hip abd green x10, blue x 10  LAQ LLE 3lb 2x15 Seated March 3lb x10, x15 each HS curls blue 2x15 Seated OHP blue ball 2x10   05/01/23 Eval   PATIENT EDUCATION:  Education details: POC Person educated: Patient Education method: Explanation Education comprehension: verbalized understanding  HOME EXERCISE PROGRAM:  ASSESSMENT:  CLINICAL IMPRESSION: Continued to work with patient on total body strength, conditioning and safety. He is unsafe not wanting assistance. Pt is impulsive and unsafe with transfers despite multiple cues, pt stated "Don't touch me until I fall" when doing transfers. He reports increase L leg pain today when doinf R hip abd and flex. Pt only feel comfortable transferring clockwise. CG- mid A with transfers today.  Cue needed for pacing at times not to complete reps too fast. Sport  Patient is a 83 y.o. male who was seen today for physical therapy evaluation and treatment for repeated falls and balance issues, he has had 3 quadriceps tendon ruptures in the past year, his last rupture was not repaired so he has no active right knee extension.  He reports repeated falls, falling every few days. Pt I very impulsive and unsafe with transfers, he is no longer walking and is w/c dependent.   He will benefit from skilled PT to decrease his risk for falls and address his overall strength, function and safety   OBJECTIVE IMPAIRMENTS: Abnormal gait, cardiopulmonary status limiting activity, decreased activity tolerance, decreased balance, decreased coordination, decreased endurance, decreased mobility, difficulty walking, decreased ROM, decreased strength, improper body mechanics, postural dysfunction, and poor safety .   REHAB POTENTIAL: Fair dependent on carry over and patient compliance  CLINICAL DECISION MAKING: Evolving/moderate complexity  EVALUATION COMPLEXITY: Moderate  GOALS: Goals reviewed with patient? Yes  SHORT TERM  GOALS: Target date: 05/29/23  Patient will be independent with initial HEP. Goal status: Met  05/10/23  LONG TERM GOALS: Target date: 07/29/23  Patient will be independent with advanced/ongoing HEP to improve outcomes and carryover.  Goal status: INITIAL  2.  Understand the need for safety with his transfers and all the steps to be as safe as possible Goal status: Ongoing 06/01/23  3.  Patient will demonstrate improved functional LE strength as demonstrated by < 25s on 5xSTS. Baseline: 83 seconds had to use hands and was unable to be independent with the standing Goal status: Progressing 06/01/23  4. Increase UE strength to 4+/5 for better transfers and function Goal status: Progressing  06/01/23  5.  Patient will report no falls in a 4 weeks period. Baseline: falls every few days Goal status: Ongoing 06/01/23 fall last "20 or 21" of Feburary   PLAN:  PT FREQUENCY: 2x/week  PT DURATION: 12 weeks  PLANNED INTERVENTIONS: Therapeutic exercises, Therapeutic activity, Neuromuscular re-education, Balance training, Gait training, Patient/Family education, Self Care, Joint mobilization, Stair training, Cryotherapy, Moist heat, and Manual therapy.  PLAN FOR NEXT SESSION: needs total body strength to help with his safety in transfers and independence.   Grayce Sessions, PTA

## 2023-06-23 ENCOUNTER — Encounter: Payer: Self-pay | Admitting: Physical Therapy

## 2023-06-23 ENCOUNTER — Ambulatory Visit: Payer: Medicare Other | Admitting: Physical Therapy

## 2023-06-23 DIAGNOSIS — R296 Repeated falls: Secondary | ICD-10-CM

## 2023-06-23 DIAGNOSIS — M25561 Pain in right knee: Secondary | ICD-10-CM

## 2023-06-23 DIAGNOSIS — R2689 Other abnormalities of gait and mobility: Secondary | ICD-10-CM

## 2023-06-23 DIAGNOSIS — M6281 Muscle weakness (generalized): Secondary | ICD-10-CM

## 2023-06-23 NOTE — Therapy (Signed)
 OUTPATIENT PHYSICAL THERAPY TREATMENT     Patient Name: Brandon Mendoza. MRN: [id number] DOB:1940/04/29, 83 y.o., male Today's Date: 06/23/2023  END OF SESSION:  PT End of Session - 06/23/23 0843     Visit Number 15    Date for PT Re-Evaluation 07/29/23    PT Start Time 0845    PT Stop Time 0930    PT Time Calculation (min) 45 min    Activity Tolerance Patient tolerated treatment well    Behavior During Therapy [facility] for tasks assessed/performed;Impulsive             Past Medical History:  Diagnosis Date   Acquired clawfoot, right foot 12/16/2021   Anxiety    Atrophy of calf muscles 10/21/2021   Duplex calves was negative for significant blockage EMG was done at [facility] MRI lumbar spine was also done at [facility]   Carotid artery occlusion    CKD (chronic kidney disease)    Stage 3   COPD (chronic obstructive pulmonary disease) (HCC)    Depression    Dysrhythmia    PAF, atrial tachycardia   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   The spinal cord appears normal. 2.   No spinal stenosis. 3.   Mild multilevel degenerative changes as detailed above that do not lead to spinal stenosis or nerve root compression. 4.   T2 hyperintense foci within the pons consistent with chronic microvascular ischemic changes.   Loop - Medtronic Linq 10/22/2020 10/22/2020   Nocturia    Paroxysmal atrial fibrillation (HCC)    Peripheral neuropathy    Peripheral vascular disease (HCC)    Sleep apnea    on 6 cm, nasal pillow   Spinal stenosis    Stroke (HCC) 05/01/2011   ischemic   Weakness generalized 10/21/2021   Severe, legs and arms   Past Surgical History:  Procedure Laterality Date   CAROTID ENDARTERECTOMY  12/09/11   Left cea   ENDARTERECTOMY   12/09/2011   Procedure: ENDARTERECTOMY CAROTID;  Surgeon: Larina Earthly, MD;  Location: [facility] OR;  Service: Vascular;  Laterality: Left;   EYE SURGERY     rt retina detachment   HAMMER TOE SURGERY  2004   HERNIA REPAIR  2006   I & D EXTREMITY Right 10/07/2022   Procedure: IRRIGATION AND DEBRIDEMENT EXTREMITY WITH WOUND CLOSURE;  Surgeon: Venita Lick, MD;  Location: [facility] ORS;  Service: Orthopedics;  Laterality: Right;   QUADRICEPS TENDON REPAIR Right 07/12/2022   Procedure: REPAIR QUADRICEP TENDON;  Surgeon: Durene Romans, MD;  Location: [facility] ORS;  Service: Orthopedics;  Laterality: Right;   QUADRICEPS TENDON REPAIR Right 08/30/2022   Procedure: REPAIR QUADRICEP TENDON;  Surgeon: Durene Romans, MD;  Location: [facility] ORS;  Service: Orthopedics;  Laterality: Right;   SHOULDER ARTHROSCOPY W/ ROTATOR CUFF REPAIR  2010   TONSILLECTOMY     Patient Active Problem List   Diagnosis Date Noted   Coronary artery calcification 05/09/2023   Rupture of quadriceps tendon 11/03/2022   Wound dehiscence 10/07/2022   Quadriceps tendon rupture, right, subsequent encounter 08/30/2022   Quadriceps tendon rupture, right, initial encounter 07/12/2022   Unable to care for self 07/08/2022   Dehydration 07/08/2022   Depression 07/08/2022  COPD (chronic obstructive pulmonary disease) (HCC) 07/08/2022   Sialoadenitis of submandibular gland 04/28/2022   Macular degeneration 03/01/2022   Myofascial pain 02/10/2022   Lumbar spondylosis 01/03/2022   Acquired clawfoot, left foot 12/16/2021   Acquired clawfoot, right foot 12/16/2021   Claw foot 12/14/2021   Gait abnormality 12/10/2021   Spinal stenosis at L4-L5 level 12/10/2021   Ataxia 11/22/2021   Lightheadedness 11/02/2021   Claudication of lower extremity (HCC) 10/21/2021   Atrophy of calf muscles 10/21/2021   Weakness generalized 10/21/2021   Focal neurological deficit 10/21/2021   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   Pain in joint of  right hip 09/14/2021   High arches 07/22/2021   Hammertoes of both feet 07/22/2021   Neuropathy 07/22/2021   Constipation 05/28/2021   Disequilibrium 05/28/2021   History of colonic polyps 05/28/2021   Renal cyst 05/28/2021   Gastroesophageal reflux disease without esophagitis 05/05/2021   Ingrowing left great toenail 02/15/2021   S/P lumbar fusion 12/24/2020   Spinal stenosis of lumbar region 12/04/2020   Acquired complex renal cyst 11/23/2020   Functional gait abnormality 11/04/2020   Idiopathic neuropathy 11/02/2020   Tinea pedis of both feet 10/28/2020   Loop - Medtronic Linq 10/22/2020 10/22/2020   Benign hypertension with 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) 10/05/2020   Fall with injury 10/05/2020   Hematoma 09/16/2020   Normocytic anemia 09/14/2020   Leukocytosis 09/14/2020   AF (paroxysmal atrial fibrillation) (HCC) 09/14/2020   Chronic anticoagulation 09/14/2020   Hyponatremia 09/14/2020   Pain in left foot 01/22/2020   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   Pain in joint of right knee 12/27/2019   Recurrent falls 11/17/2019   Paroxysmal atrial fibrillation (HCC) 10/22/2019   Pain in right foot 10/14/2019   Falls 10/07/2019   Ataxia involving legs 08/12/2019   OSA on CPAP 06/20/2018   Urge incontinence 04/13/2018   Urinary urgency 04/13/2018   Pain in left knee 03/01/2018   Other intervertebral disc degeneration, lumbar region 06/16/2017   Acquired bilateral hammer toes 10/05/2015   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   Generalized anxiety disorder 05/12/2015   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   History of left-sided carotid endarterectomy 04/10/2014   Essential hypertension 04/10/2014   Occlusion and stenosis of carotid artery without mention of cerebral infarction 12/15/2011   Habitual alcohol use 12/01/2011   Carotid artery stenosis,  symptomatic 11/30/2011   Cerebral infarction (HCC) 11/29/2011   Mixed hyperlipidemia     PCP: Glenetta Hew  REFERRING PROVIDER: Glenetta Hew  REFERRING DIAG: weakness, multiple falls  Rationale for Evaluation and Treatment: Rehabilitation  THERAPY DIAG:  Other abnormalities of gait and mobility  Acute pain of right knee  Muscle weakness (generalized)  Repeated falls  ONSET DATE: 10/07/22  SUBJECTIVE:  SUBJECTIVE STATEMENT: Im ok got some soreness in the legs, not pain  PERTINENT HISTORY:  Cerebral infarct 10/21/21, lumbar fusion, recurrent falls, has significant neuropathy and this is the reason for most of his falls   PAIN:  Are you having pain? Yes: NPRS scale: 0/10 Pain location: right knee Pain description: ache  Aggravating factors: being on my feet  Relieving factors: movement  PRECAUTIONS: Fall  WEIGHT BEARING RESTRICTIONS: No  FALLS:  Has patient fallen in last 6 months? Yes. Number of falls every few days   LIVING ENVIRONMENT: Lives with: lives with their spouse Lives in: House/apartment Stairs: Yes: Internal: 20 steps; on right going up Has following equipment at home: Dan Humphreys - 4 wheeled, Wheelchair (manual), shower chair, Tour manager, Grab bars, Ramped entry, and trekking pole  OCCUPATION: Retired  PLOF: Independent, prior to a year ago he was driving used trekking poles to walk  PATIENT GOALS: live at home  NEXT MD VISIT:   OBJECTIVE:   DIAGNOSTIC FINDINGS:    LUMBAR SPINE FINDINGS: 1. Wide laminectomy and posterior pedicle screw and rod fixation at L4-5 without residual or recurrent stenosis. 2. Progressive adjacent level disease at L3-4 with moderate right and mild left subarticular narrowing and moderate foraminal stenosis bilaterally. Progression is  predominantly due to facet disease 3. Progressive moderate facet hypertrophy at L1-2 with a progressive rightward disc protrusion resulting in progressive moderate right foraminal stenosis. Asymmetric endplate marrow edema on the right at L1-2 is consistent with progressive disease as well. 4. Otherwise stable degenerative change without other focal stenosis.   THORACIC SPINE FINDINGS:  On sagittal views the vertebral bodies have normal height and alignment.  Hemangioma within T9 vertebral body and slightly within T10 vertebral body.  Anterior kyphosis.  Mild scoliosis convex right centered at T8 level. The spinal cord is normal in size and appearance. The paraspinal soft tissues are unremarkable.     On axial views there is no spinal stenosis or foraminal narrowing.  Limited views of the aorta, kidneys, liver, lungs and paraspinal muscles are notable for multiple large right renal cysts ranging in size from 1.5 to 7 cm.   COGNITION: Overall cognitive status: Within functional limits for tasks assessed     SENSATION: WFL  MUSCLE LENGTH: Hamstrings: mod tightness on both sides  POSTURE: rounded shoulders and forward head  LOWER EXTREMITY ROM:   the right knee has HS, has no quad tendon on the right, unable to lift the leg, left leg WFL's , left hip and right hip and ankles WFL's   LOWER EXTREMITY MMT: 4-/5 for the LE's except for the right knee extension UPPER EXTREMITY MMT:  4-/5 for shoulders and elbows   FUNCTIONAL TESTS:  5XSTS: 83 seconds really has to use his hands and is unsteady with standing Cannot stand on his own without holding onto something due to poor balance   Transfers:  needs set up and MinA for safety, is impulsive and unsafe at times. GAIT: He is w/c dependent    TODAY'S TREATMENT:  DATE:  05/2823 UBE L1 x 2 min each  NuStep L6 x4  min, x LE only  Seated at mat without Back support LAQ 5lb 2x15 Seated march 5lb 2x15   UE MMT grosley 4+/5  Blue ball  OHP 2x10  Seated trunk rotations blue ball 2x15  HS curls blue 2x15   06/20/23 NuStep L 5 x 7 min, x4 min LE only RLE hip abd and flex in // bars 2x10 15# in w/c straight arm pulls 15# rows 15# biceps curls 10# chest press At Mat table  Seated trunk rotations blue ball 2x15  Blue ball  OHP 2x10  Horiz abd blue 2x15  06/12/23 NuStep L 5 x 7 min, x3 min LE only At Mat table   STM w/ thea gun to L lateral thigh  Blue ball  OHP 2x15  HS Curl green LLE x10, RLE 2x10  Horiz abd blue 2x10  LAQ LLE 5lb  2x15  Seated trunk rotations blue ball 2x15   06/09/23 NuStep L 5 x 7 min, x4 min LE only 15# in w/c straight arm pulls 15# rows 10# chest press At Mat table   4lb OHP   HS Curle green 2x10  LAQ 5lb 2x10  Seated trunk rotations blue ball 3x10  Horiz shoulder Abd blue 2x10  06/07/23 NuStep L 5 x 7 min, x4 min LE only 15# in w/c straight arm pulls 15# rows 10# chest press 20# biceps curls Leg press 40# 2x15 AT Mat table   4lb OHP   Crunches   Horiz shoulder Abd green 2x10  Seated march no UE support  06/01/23 At Mat table 5XSTS to The back of WC: 64 seconds really has to use his hands and is unsteady with standing Cannot stand on his own without holding onto something due to not having a L quad tendon MMT RUE flex 4/5 abd 4-/5, LUE Flex 4/5, Abd 4/5 Seated military press 5lb 2x10 Seated rows blue 2x15 Horiz abd green 2x15 HS curls green x15, blue x 15  Seated crunches 2x15 Nustep level 6 10 minutes 3 minutes with LE's only  05/29/23 Bike level 5 x 6 minutes 15# in w/c straight arm pulls 15# rows 10# chest press 20# biceps curls 10# overhead press 55# leg curls both 2x10, right only 25# 2x10 Leg extension left only 10# 2x10 LEg press 40# 2x10, then single legs 2x10, right leg was eccentrics Pball in lap isometric abs Nustep level 6 LE  only 2x10  05/25/23 Nustep level 6 10 minutes 3 minutes with LE's only In pbars standing marches with PT clocking knees In pbars standing balance with PT close to knees if they give Leg curls 55# 2x10 both legs, right leg only 25# 2 x10 LEg extension left only 10# 3x10 15# straight arm pulls 15# rows 10# chest press Biceps 15# and 20# 2x10 10# overhead press  05/22/23 Nustep level 6 x 10 minute, 3 without UE Seated Crunches 2x15 Seated weight overhead ball toss 2x15 LAQ LLE 5lb 2x15 Seated march 5lb 2x10 HS curls blue 2x15 Seated blue ball OHP 3x10 Horiz abd blue 3x10 Shoulder Ext 10lb 2x15  05/17/23 Nustep level 6 x 10 minute, 3 without UE 10# straight arm pulls in w/c 3x10 10# chest press in w/c 3x10 Shoulder abd 3lb x10, 1lb x10 Leg press 50# 3x10, Heel raises 50lb 3x10 PTA blocking R knee Seated Ab sets  Seated Pball roll outs for lower back stretch   05/15/23 Nustep level 6  x 13 minute, 3 without UE 10# straight arm pulls in w/c 3x10 10# chest press in w/c 3x10 Leg press 40# 3x10 Standing hip Flex And Ext  in RW x10 eaxh PTA blocking RLE to prevent buckling Seated on Mat OHP 12lb WaTE 2x12 Seated Trunk rotations Blue 2x12   05/12/23 Nustep level 6 x 10 minutes, 3.5 minutes without arms STS in // bars 7 x working on full ext of knees esp left cg A Static standing in // bars with wt shifts CGA and cuing Standing in // bars with PTA supporting LLE and RT LE stepping fwd 10x then laterally 10 x Leg press 40# 3x10- cued for full TKE and slower controlled speed 10# straight arm pulls in w/c 2 x 15 Blue tband chest press in w/c 3x10 then flys 10# biceps curls in w/c 2 sets 15 Leg curls 35# 10x , 45# 2 sets 10- cued for full ROM Seated OH ball press 2 sets 10 - blue ball Seated blue ball core work   05/10/23 Nustep level 6 x 10 minutes, 2 minutes without arms 10# straight arm pulls in w/c 3x10 10# chest press in w/c 3x10 10# biceps curls in w/c Leg press 40#  3x10 Leg curls 35# 3x10 Green tband HS curls Green tband ankle all motions Transfers to and from surfaces and machines require CGA to Max A due to safety and the legs buckling  05/04/23 NuStep L6 x 10 min Horiz shoulder abd green 2x15 Hip add ball squeeze 2x15 Hip abd green x10, blue x 10  LAQ LLE 3lb 2x15 Seated March 3lb x10, x15 each HS curls blue 2x15 Seated OHP blue ball 2x10   05/01/23 Eval   PATIENT EDUCATION:  Education details: POC Person educated: Patient Education method: Explanation Education comprehension: verbalized understanding  HOME EXERCISE PROGRAM:  ASSESSMENT:  CLINICAL IMPRESSION: Continued to work with patient on total body strength, conditioning and safety.  Pt remains impulsive and unsafe with transfers despite multiple cues,  Pt only feel comfortable transferring clockwise. CG- mid A with transfers today.  Cue needed for pacing at times not to complete reps too fast. Core weakens ith seated trunk rotations. Pt repots getting his MD appointment moved to May  Patient is a 83 y.o. male who was seen today for physical therapy evaluation and treatment for repeated falls and balance issues, he has had 3 quadriceps tendon ruptures in the past year, his last rupture was not repaired so he has no active right knee extension.  He reports repeated falls, falling every few days. Pt I very impulsive and unsafe with transfers, he is no longer walking and is w/c dependent.   He will benefit from skilled PT to decrease his risk for falls and address his overall strength, function and safety   OBJECTIVE IMPAIRMENTS: Abnormal gait, cardiopulmonary status limiting activity, decreased activity tolerance, decreased balance, decreased coordination, decreased endurance, decreased mobility, difficulty walking, decreased ROM, decreased strength, improper body mechanics, postural dysfunction, and poor safety .   REHAB POTENTIAL: Fair dependent on carry over and patient  compliance  CLINICAL DECISION MAKING: Evolving/moderate complexity  EVALUATION COMPLEXITY: Moderate  GOALS: Goals reviewed with patient? Yes  SHORT TERM GOALS: Target date: 05/29/23  Patient will be independent with initial HEP. Goal status: Met  05/10/23  LONG TERM GOALS: Target date: 07/29/23  Patient will be independent with advanced/ongoing HEP to improve outcomes and carryover.  Goal status: INITIAL  2.  Understand the need for safety with his transfers and  all the steps to be as safe as possible Goal status: Ongoing 06/01/23  3.  Patient will demonstrate improved functional LE strength as demonstrated by < 25s on 5xSTS. Baseline: 83 seconds had to use hands and was unable to be independent with the standing Goal status: Progressing 06/01/23  4. Increase UE strength to 4+/5 for better transfers and function Goal status: Progressing  06/01/23  5.  Patient will report no falls in a 4 weeks period. Baseline: falls every few days Goal status: Ongoing 06/01/23 fall last "20 or 21" of Feburary, Progressing 06/23/23  PLAN:  PT FREQUENCY: 2x/week  PT DURATION: 12 weeks  PLANNED INTERVENTIONS: Therapeutic exercises, Therapeutic activity, Neuromuscular re-education, Balance training, Gait training, Patient/Family education, Self Care, Joint mobilization, Stair training, Cryotherapy, Moist heat, and Manual therapy.  PLAN FOR NEXT SESSION: needs total body strength to help with his safety in transfers and independence.   Grayce Sessions, PTA

## 2023-06-27 ENCOUNTER — Ambulatory Visit: Payer: Medicare Other | Attending: Internal Medicine | Admitting: Physical Therapy

## 2023-06-27 ENCOUNTER — Encounter: Payer: Self-pay | Admitting: Physical Therapy

## 2023-06-27 DIAGNOSIS — M25561 Pain in right knee: Secondary | ICD-10-CM | POA: Insufficient documentation

## 2023-06-27 DIAGNOSIS — M6249 Contracture of muscle, multiple sites: Secondary | ICD-10-CM | POA: Diagnosis present

## 2023-06-27 DIAGNOSIS — R2689 Other abnormalities of gait and mobility: Secondary | ICD-10-CM | POA: Diagnosis present

## 2023-06-27 DIAGNOSIS — M5459 Other low back pain: Secondary | ICD-10-CM | POA: Insufficient documentation

## 2023-06-27 DIAGNOSIS — M6281 Muscle weakness (generalized): Secondary | ICD-10-CM | POA: Diagnosis present

## 2023-06-27 DIAGNOSIS — R296 Repeated falls: Secondary | ICD-10-CM | POA: Diagnosis present

## 2023-06-27 NOTE — Therapy (Signed)
 OUTPATIENT PHYSICAL THERAPY TREATMENT     Patient Name: Brandon Mendoza. MRN: [id number] DOB:1940-08-28, 83 y.o., male Today's Date: 06/27/2023  END OF SESSION:  PT End of Session - 06/27/23 0848     Visit Number 16    Date for PT Re-Evaluation 07/29/23    PT Start Time 0845    PT Stop Time 0930    PT Time Calculation (min) 45 min    Activity Tolerance Patient tolerated treatment well    Behavior During Therapy [facility] for tasks assessed/performed;Impulsive             Past Medical History:  Diagnosis Date   Acquired clawfoot, right foot 12/16/2021   Anxiety    Atrophy of calf muscles 10/21/2021   Duplex calves was negative for significant blockage EMG was done at [facility] MRI lumbar spine was also done at [facility]   Carotid artery occlusion    CKD (chronic kidney disease)    Stage 3   COPD (chronic obstructive pulmonary disease) (HCC)    Depression    Dysrhythmia    PAF, atrial tachycardia   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   The spinal cord appears normal. 2.   No spinal stenosis. 3.   Mild multilevel degenerative changes as detailed above that do not lead to spinal stenosis or nerve root compression. 4.   T2 hyperintense foci within the pons consistent with chronic microvascular ischemic changes.   Loop - Medtronic Linq 10/22/2020 10/22/2020   Nocturia    Paroxysmal atrial fibrillation (HCC)    Peripheral neuropathy    Peripheral vascular disease (HCC)    Sleep apnea    on 6 cm, nasal pillow   Spinal stenosis    Stroke (HCC) 05/01/2011   ischemic   Weakness generalized 10/21/2021   Severe, legs and arms   Past Surgical History:  Procedure Laterality Date   CAROTID ENDARTERECTOMY  12/09/11   Left cea   ENDARTERECTOMY   12/09/2011   Procedure: ENDARTERECTOMY CAROTID;  Surgeon: Larina Earthly, MD;  Location: [facility] OR;  Service: Vascular;  Laterality: Left;   EYE SURGERY     rt retina detachment   HAMMER TOE SURGERY  2004   HERNIA REPAIR  2006   I & D EXTREMITY Right 10/07/2022   Procedure: IRRIGATION AND DEBRIDEMENT EXTREMITY WITH WOUND CLOSURE;  Surgeon: Venita Lick, MD;  Location: [facility] ORS;  Service: Orthopedics;  Laterality: Right;   QUADRICEPS TENDON REPAIR Right 07/12/2022   Procedure: REPAIR QUADRICEP TENDON;  Surgeon: Durene Romans, MD;  Location: [facility] ORS;  Service: Orthopedics;  Laterality: Right;   QUADRICEPS TENDON REPAIR Right 08/30/2022   Procedure: REPAIR QUADRICEP TENDON;  Surgeon: Durene Romans, MD;  Location: [facility] OR[facility];  Service: Orthopedics;  Laterality: Right;   SHOULDER ARTHROSCOPY W/ ROTATOR CUFF REPAIR  2010   TONSILLECTOMY     Patient Active Problem List   Diagnosis Date Noted   Coronary artery calcification 05/09/2023   Rupture of quadriceps tendon 11/03/2022   Wound dehiscence 10/07/2022   Quadriceps tendon rupture, right, subsequent encounter 08/30/2022   Quadriceps tendon rupture, right, initial encounter 07/12/2022   Unable to care for self 07/08/2022   Dehydration 07/08/2022   Depression 07/08/2022  COPD (chronic obstructive pulmonary disease) (HCC) 07/08/2022   Sialoadenitis of submandibular gland 04/28/2022   Macular degeneration 03/01/2022   Myofascial pain 02/10/2022   Lumbar spondylosis 01/03/2022   Acquired clawfoot, left foot 12/16/2021   Acquired clawfoot, right foot 12/16/2021   Claw foot 12/14/2021   Gait abnormality 12/10/2021   Spinal stenosis at L4-L5 level 12/10/2021   Ataxia 11/22/2021   Lightheadedness 11/02/2021   Claudication of lower extremity (HCC) 10/21/2021   Atrophy of calf muscles 10/21/2021   Weakness generalized 10/21/2021   Focal neurological deficit 10/21/2021   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   Pain in joint of  right hip 09/14/2021   High arches 07/22/2021   Hammertoes of both feet 07/22/2021   Neuropathy 07/22/2021   Constipation 05/28/2021   Disequilibrium 05/28/2021   History of colonic polyps 05/28/2021   Renal cyst 05/28/2021   Gastroesophageal reflux disease without esophagitis 05/05/2021   Ingrowing left great toenail 02/15/2021   S/P lumbar fusion 12/24/2020   Spinal stenosis of lumbar region 12/04/2020   Acquired complex renal cyst 11/23/2020   Functional gait abnormality 11/04/2020   Idiopathic neuropathy 11/02/2020   Tinea pedis of both feet 10/28/2020   Loop - Medtronic Linq 10/22/2020 10/22/2020   Benign hypertension with 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) 10/05/2020   Fall with injury 10/05/2020   Hematoma 09/16/2020   Normocytic anemia 09/14/2020   Leukocytosis 09/14/2020   AF (paroxysmal atrial fibrillation) (HCC) 09/14/2020   Chronic anticoagulation 09/14/2020   Hyponatremia 09/14/2020   Pain in left foot 01/22/2020   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   Pain in joint of right knee 12/27/2019   Recurrent falls 11/17/2019   Paroxysmal atrial fibrillation (HCC) 10/22/2019   Pain in right foot 10/14/2019   Falls 10/07/2019   Ataxia involving legs 08/12/2019   OSA on CPAP 06/20/2018   Urge incontinence 04/13/2018   Urinary urgency 04/13/2018   Pain in left knee 03/01/2018   Other intervertebral disc degeneration, lumbar region 06/16/2017   Acquired bilateral hammer toes 10/05/2015   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   Generalized anxiety disorder 05/12/2015   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   History of left-sided carotid endarterectomy 04/10/2014   Essential hypertension 04/10/2014   Occlusion and stenosis of carotid artery without mention of cerebral infarction 12/15/2011   Habitual alcohol use 12/01/2011   Carotid artery stenosis,  symptomatic 11/30/2011   Cerebral infarction (HCC) 11/29/2011   Mixed hyperlipidemia     PCP: Glenetta Hew  REFERRING PROVIDER: Glenetta Hew  REFERRING DIAG: weakness, multiple falls  Rationale for Evaluation and Treatment: Rehabilitation  THERAPY DIAG:  Other abnormalities of gait and mobility  Acute pain of right knee  Muscle weakness (generalized)  Repeated falls  Contracture of muscle, multiple sites  ONSET DATE: 10/07/22  SUBJECTIVE:  SUBJECTIVE STATEMENT: "Ok, just aches no pain" One fall, slipped getting into the recliner, legs got tangles up, got right up. MD appointment tomorrow   PERTINENT HISTORY:  Cerebral infarct 10/21/21, lumbar fusion, recurrent falls, has significant neuropathy and this is the reason for most of his falls   PAIN:  Are you having pain? Yes: NPRS scale: 0/10 Pain location: right knee Pain description: ache  Aggravating factors: being on my feet  Relieving factors: movement  PRECAUTIONS: Fall  WEIGHT BEARING RESTRICTIONS: No  FALLS:  Has patient fallen in last 6 months? Yes. Number of falls every few days   LIVING ENVIRONMENT: Lives with: lives with their spouse Lives in: House/apartment Stairs: Yes: Internal: 20 steps; on right going up Has following equipment at home: Dan Humphreys - 4 wheeled, Wheelchair (manual), shower chair, Tour manager, Grab bars, Ramped entry, and trekking pole  OCCUPATION: Retired  PLOF: Independent, prior to a year ago he was driving used trekking poles to walk  PATIENT GOALS: live at home  NEXT MD VISIT:   OBJECTIVE:   DIAGNOSTIC FINDINGS:    LUMBAR SPINE FINDINGS: 1. Wide laminectomy and posterior pedicle screw and rod fixation at L4-5 without residual or recurrent stenosis. 2. Progressive adjacent level disease at  L3-4 with moderate right and mild left subarticular narrowing and moderate foraminal stenosis bilaterally. Progression is predominantly due to facet disease 3. Progressive moderate facet hypertrophy at L1-2 with a progressive rightward disc protrusion resulting in progressive moderate right foraminal stenosis. Asymmetric endplate marrow edema on the right at L1-2 is consistent with progressive disease as well. 4. Otherwise stable degenerative change without other focal stenosis.   THORACIC SPINE FINDINGS:  On sagittal views the vertebral bodies have normal height and alignment.  Hemangioma within T9 vertebral body and slightly within T10 vertebral body.  Anterior kyphosis.  Mild scoliosis convex right centered at T8 level. The spinal cord is normal in size and appearance. The paraspinal soft tissues are unremarkable.     On axial views there is no spinal stenosis or foraminal narrowing.  Limited views of the aorta, kidneys, liver, lungs and paraspinal muscles are notable for multiple large right renal cysts ranging in size from 1.5 to 7 cm.   COGNITION: Overall cognitive status: Within functional limits for tasks assessed     SENSATION: WFL  MUSCLE LENGTH: Hamstrings: mod tightness on both sides  POSTURE: rounded shoulders and forward head  LOWER EXTREMITY ROM:   the right knee has HS, has no quad tendon on the right, unable to lift the leg, left leg WFL's , left hip and right hip and ankles WFL's   LOWER EXTREMITY MMT: 4-/5 for the LE's except for the right knee extension UPPER EXTREMITY MMT:  4-/5 for shoulders and elbows   FUNCTIONAL TESTS:  5XSTS: 83 seconds really has to use his hands and is unsteady with standing Cannot stand on his own without holding onto something due to poor balance   Transfers:  needs set up and MinA for safety, is impulsive and unsafe at times. GAIT: He is w/c dependent    TODAY'S TREATMENT:  DATE:  06/27/23 NuStep L6 x6 min, x 4 min LE only  15# in w/c straight arm pulls 15# rows 15# biceps curls 10# chest press Overhead Triceps  ext 2x15 Chest flys 10lb 2x15 WC dips x5, x5, x10 Hs curls blue 2x10  05/2823 UBE L1 x 2 min each  NuStep L6 x4 min, x LE only  Seated at mat without Back support LAQ 5lb 2x15 Seated march 5lb 2x15   UE MMT grosley 4+/5  Blue ball  OHP 2x10  Seated trunk rotations blue ball 2x15  HS curls blue 2x15   06/20/23 NuStep L 5 x 7 min, x4 min LE only RLE hip abd and flex in // bars 2x10 15# in w/c straight arm pulls 15# rows 15# biceps curls 10# chest press At Mat table  Seated trunk rotations blue ball 2x15  Blue ball  OHP 2x10  Horiz abd blue 2x15  06/12/23 NuStep L 5 x 7 min, x3 min LE only At Mat table   STM w/ thea gun to L lateral thigh  Blue ball  OHP 2x15  HS Curl green LLE x10, RLE 2x10  Horiz abd blue 2x10  LAQ LLE 5lb  2x15  Seated trunk rotations blue ball 2x15   06/09/23 NuStep L 5 x 7 min, x4 min LE only 15# in w/c straight arm pulls 15# rows 10# chest press At Mat table   4lb OHP   HS Curle green 2x10  LAQ 5lb 2x10  Seated trunk rotations blue ball 3x10  Horiz shoulder Abd blue 2x10  06/07/23 NuStep L 5 x 7 min, x4 min LE only 15# in w/c straight arm pulls 15# rows 10# chest press 20# biceps curls Leg press 40# 2x15 AT Mat table   4lb OHP   Crunches   Horiz shoulder Abd green 2x10  Seated march no UE support  06/01/23 At Mat table 5XSTS to The back of WC: 64 seconds really has to use his hands and is unsteady with standing Cannot stand on his own without holding onto something due to not having a L quad tendon MMT RUE flex 4/5 abd 4-/5, LUE Flex 4/5, Abd 4/5 Seated military press 5lb 2x10 Seated rows blue 2x15 Horiz abd green 2x15 HS curls green x15, blue x 15  Seated crunches 2x15 Nustep level 6 10 minutes 3 minutes with  LE's only  05/29/23 Bike level 5 x 6 minutes 15# in w/c straight arm pulls 15# rows 10# chest press 20# biceps curls 10# overhead press 55# leg curls both 2x10, right only 25# 2x10 Leg extension left only 10# 2x10 LEg press 40# 2x10, then single legs 2x10, right leg was eccentrics Pball in lap isometric abs Nustep level 6 LE only 2x10  05/25/23 Nustep level 6 10 minutes 3 minutes with LE's only In pbars standing marches with PT clocking knees In pbars standing balance with PT close to knees if they give Leg curls 55# 2x10 both legs, right leg only 25# 2 x10 LEg extension left only 10# 3x10 15# straight arm pulls 15# rows 10# chest press Biceps 15# and 20# 2x10 10# overhead press  05/22/23 Nustep level 6 x 10 minute, 3 without UE Seated Crunches 2x15 Seated weight overhead ball toss 2x15 LAQ LLE 5lb 2x15 Seated march 5lb 2x10 HS curls blue 2x15 Seated blue ball OHP 3x10 Horiz abd blue 3x10 Shoulder Ext 10lb 2x15  05/17/23 Nustep level 6 x 10 minute, 3 without UE 10# straight arm pulls in w/c  3x10 10# chest press in w/c 3x10 Shoulder abd 3lb x10, 1lb x10 Leg press 50# 3x10, Heel raises 50lb 3x10 PTA blocking R knee Seated Ab sets  Seated Pball roll outs for lower back stretch   05/15/23 Nustep level 6 x 13 minute, 3 without UE 10# straight arm pulls in w/c 3x10 10# chest press in w/c 3x10 Leg press 40# 3x10 Standing hip Flex And Ext  in RW x10 eaxh PTA blocking RLE to prevent buckling Seated on Mat OHP 12lb WaTE 2x12 Seated Trunk rotations Blue 2x12   05/12/23 Nustep level 6 x 10 minutes, 3.5 minutes without arms STS in // bars 7 x working on full ext of knees esp left cg A Static standing in // bars with wt shifts CGA and cuing Standing in // bars with PTA supporting LLE and RT LE stepping fwd 10x then laterally 10 x Leg press 40# 3x10- cued for full TKE and slower controlled speed 10# straight arm pulls in w/c 2 x 15 Blue tband chest press in w/c 3x10 then  flys 10# biceps curls in w/c 2 sets 15 Leg curls 35# 10x , 45# 2 sets 10- cued for full ROM Seated OH ball press 2 sets 10 - blue ball Seated blue ball core work   05/10/23 Nustep level 6 x 10 minutes, 2 minutes without arms 10# straight arm pulls in w/c 3x10 10# chest press in w/c 3x10 10# biceps curls in w/c Leg press 40# 3x10 Leg curls 35# 3x10 Green tband HS curls Green tband ankle all motions Transfers to and from surfaces and machines require CGA to Max A due to safety and the legs buckling  05/04/23 NuStep L6 x 10 min Horiz shoulder abd green 2x15 Hip add ball squeeze 2x15 Hip abd green x10, blue x 10  LAQ LLE 3lb 2x15 Seated March 3lb x10, x15 each HS curls blue 2x15 Seated OHP blue ball 2x10   05/01/23 Eval   PATIENT EDUCATION:  Education details: POC Person educated: Patient Education method: Explanation Education comprehension: verbalized understanding  HOME EXERCISE PROGRAM:  ASSESSMENT:  CLINICAL IMPRESSION: Continued to work with patient on total body strength, conditioning and safety. More focus on UE to allow LE to rest. L knee pain at quad tendon reported on NuStep. Pt remains impulsive and unsafe with transfers despite multiple cues,  Pt only feel comfortable transferring clockwise. CG- mid A with transfers today.  Cue needed for pacing at times not to complete reps too fast.  Pt repots that his MD appointment was moved to tomorrow  Patient is a 83 y.o. male who was seen today for physical therapy evaluation and treatment for repeated falls and balance issues, he has had 3 quadriceps tendon ruptures in the past year, his last rupture was not repaired so he has no active right knee extension.  He reports repeated falls, falling every few days. Pt I very impulsive and unsafe with transfers, he is no longer walking and is w/c dependent.   He will benefit from skilled PT to decrease his risk for falls and address his overall strength, function and safety    OBJECTIVE IMPAIRMENTS: Abnormal gait, cardiopulmonary status limiting activity, decreased activity tolerance, decreased balance, decreased coordination, decreased endurance, decreased mobility, difficulty walking, decreased ROM, decreased strength, improper body mechanics, postural dysfunction, and poor safety .   REHAB POTENTIAL: Fair dependent on carry over and patient compliance  CLINICAL DECISION MAKING: Evolving/moderate complexity  EVALUATION COMPLEXITY: Moderate  GOALS: Goals reviewed with patient? Yes  SHORT TERM GOALS: Target date: 05/29/23  Patient will be independent with initial HEP. Goal status: Met  05/10/23  LONG TERM GOALS: Target date: 07/29/23  Patient will be independent with advanced/ongoing HEP to improve outcomes and carryover.  Goal status: INITIAL  2.  Understand the need for safety with his transfers and all the steps to be as safe as possible Goal status: Ongoing 06/01/23  3.  Patient will demonstrate improved functional LE strength as demonstrated by < 25s on 5xSTS. Baseline: 83 seconds had to use hands and was unable to be independent with the standing Goal status: Progressing 06/01/23  4. Increase UE strength to 4+/5 for better transfers and function Goal status: Progressing  06/01/23  5.  Patient will report no falls in a 4 weeks period. Baseline: falls every few days Goal status: Ongoing 06/01/23 fall last "20 or 21" of Feburary, Progressing 06/23/23  PLAN:  PT FREQUENCY: 2x/week  PT DURATION: 12 weeks  PLANNED INTERVENTIONS: Therapeutic exercises, Therapeutic activity, Neuromuscular re-education, Balance training, Gait training, Patient/Family education, Self Care, Joint mobilization, Stair training, Cryotherapy, Moist heat, and Manual therapy.  PLAN FOR NEXT SESSION: needs total body strength to help with his safety in transfers and independence.   Grayce Sessions, PTA

## 2023-06-29 ENCOUNTER — Ambulatory Visit: Payer: Medicare Other | Admitting: Physical Therapy

## 2023-06-29 ENCOUNTER — Encounter: Payer: Self-pay | Admitting: Physical Therapy

## 2023-06-29 DIAGNOSIS — R296 Repeated falls: Secondary | ICD-10-CM

## 2023-06-29 DIAGNOSIS — M6249 Contracture of muscle, multiple sites: Secondary | ICD-10-CM

## 2023-06-29 DIAGNOSIS — M25561 Pain in right knee: Secondary | ICD-10-CM

## 2023-06-29 DIAGNOSIS — R2689 Other abnormalities of gait and mobility: Secondary | ICD-10-CM

## 2023-06-29 DIAGNOSIS — M6281 Muscle weakness (generalized): Secondary | ICD-10-CM

## 2023-06-29 NOTE — Progress Notes (Signed)
 [facility] Summary Report / Loop Recorder

## 2023-06-29 NOTE — Therapy (Signed)
 OUTPATIENT PHYSICAL THERAPY TREATMENT     Patient Name: Brandon Mendoza. MRN: [id number] DOB:12-31-1940, 83 y.o., male Today's Date: 06/29/2023  END OF SESSION:  PT End of Session - 06/29/23 0852     Visit Number 17    Date for PT Re-Evaluation 07/29/23    PT Start Time 0845    PT Stop Time 0930    PT Time Calculation (min) 45 min    Activity Tolerance Patient tolerated treatment well    Behavior During Therapy [facility] for tasks assessed/performed;Impulsive             Past Medical History:  Diagnosis Date   Acquired clawfoot, right foot 12/16/2021   Anxiety    Atrophy of calf muscles 10/21/2021   Duplex calves was negative for significant blockage EMG was done at [facility] MRI lumbar spine was also done at [facility]   Carotid artery occlusion    CKD (chronic kidney disease)    Stage 3   COPD (chronic obstructive pulmonary disease) (HCC)    Depression    Dysrhythmia    PAF, atrial tachycardia   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   The spinal cord appears normal. 2.   No spinal stenosis. 3.   Mild multilevel degenerative changes as detailed above that do not lead to spinal stenosis or nerve root compression. 4.   T2 hyperintense foci within the pons consistent with chronic microvascular ischemic changes.   Loop - Medtronic Linq 10/22/2020 10/22/2020   Nocturia    Paroxysmal atrial fibrillation (HCC)    Peripheral neuropathy    Peripheral vascular disease (HCC)    Sleep apnea    on 6 cm, nasal pillow   Spinal stenosis    Stroke (HCC) 05/01/2011   ischemic   Weakness generalized 10/21/2021   Severe, legs and arms   Past Surgical History:  Procedure Laterality Date   CAROTID ENDARTERECTOMY  12/09/11   Left cea   ENDARTERECTOMY   12/09/2011   Procedure: ENDARTERECTOMY CAROTID;  Surgeon: Larina Earthly, MD;  Location: [facility] OR;  Service: Vascular;  Laterality: Left;   EYE SURGERY     rt retina detachment   HAMMER TOE SURGERY  2004   HERNIA REPAIR  2006   I & D EXTREMITY Right 10/07/2022   Procedure: IRRIGATION AND DEBRIDEMENT EXTREMITY WITH WOUND CLOSURE;  Surgeon: Venita Lick, MD;  Location: [facility] ORS;  Service: Orthopedics;  Laterality: Right;   QUADRICEPS TENDON REPAIR Right 07/12/2022   Procedure: REPAIR QUADRICEP TENDON;  Surgeon: Durene Romans, MD;  Location: [facility] ORS;  Service: Orthopedics;  Laterality: Right;   QUADRICEPS TENDON REPAIR Right 08/30/2022   Procedure: REPAIR QUADRICEP TENDON;  Surgeon: Durene Romans, MD;  Location: [facility] ORS;  Service: Orthopedics;  Laterality: Right;   SHOULDER ARTHROSCOPY W/ ROTATOR CUFF REPAIR  2010   TONSILLECTOMY     Patient Active Problem List   Diagnosis Date Noted   Coronary artery calcification 05/09/2023   Rupture of quadriceps tendon 11/03/2022   Wound dehiscence 10/07/2022   Quadriceps tendon rupture, right, subsequent encounter 08/30/2022   Quadriceps tendon rupture, right, initial encounter 07/12/2022   Unable to care for self 07/08/2022   Dehydration 07/08/2022   Depression 07/08/2022  COPD (chronic obstructive pulmonary disease) (HCC) 07/08/2022   Sialoadenitis of submandibular gland 04/28/2022   Macular degeneration 03/01/2022   Myofascial pain 02/10/2022   Lumbar spondylosis 01/03/2022   Acquired clawfoot, left foot 12/16/2021   Acquired clawfoot, right foot 12/16/2021   Claw foot 12/14/2021   Gait abnormality 12/10/2021   Spinal stenosis at L4-L5 level 12/10/2021   Ataxia 11/22/2021   Lightheadedness 11/02/2021   Claudication of lower extremity (HCC) 10/21/2021   Atrophy of calf muscles 10/21/2021   Weakness generalized 10/21/2021   Focal neurological deficit 10/21/2021   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   Pain in joint of  right hip 09/14/2021   High arches 07/22/2021   Hammertoes of both feet 07/22/2021   Neuropathy 07/22/2021   Constipation 05/28/2021   Disequilibrium 05/28/2021   History of colonic polyps 05/28/2021   Renal cyst 05/28/2021   Gastroesophageal reflux disease without esophagitis 05/05/2021   Ingrowing left great toenail 02/15/2021   S/P lumbar fusion 12/24/2020   Spinal stenosis of lumbar region 12/04/2020   Acquired complex renal cyst 11/23/2020   Functional gait abnormality 11/04/2020   Idiopathic neuropathy 11/02/2020   Tinea pedis of both feet 10/28/2020   Loop - Medtronic Linq 10/22/2020 10/22/2020   Benign hypertension with 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) 10/05/2020   Fall with injury 10/05/2020   Hematoma 09/16/2020   Normocytic anemia 09/14/2020   Leukocytosis 09/14/2020   AF (paroxysmal atrial fibrillation) (HCC) 09/14/2020   Chronic anticoagulation 09/14/2020   Hyponatremia 09/14/2020   Pain in left foot 01/22/2020   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   Pain in joint of right knee 12/27/2019   Recurrent falls 11/17/2019   Paroxysmal atrial fibrillation (HCC) 10/22/2019   Pain in right foot 10/14/2019   Falls 10/07/2019   Ataxia involving legs 08/12/2019   OSA on CPAP 06/20/2018   Urge incontinence 04/13/2018   Urinary urgency 04/13/2018   Pain in left knee 03/01/2018   Other intervertebral disc degeneration, lumbar region 06/16/2017   Acquired bilateral hammer toes 10/05/2015   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   Generalized anxiety disorder 05/12/2015   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   History of left-sided carotid endarterectomy 04/10/2014   Essential hypertension 04/10/2014   Occlusion and stenosis of carotid artery without mention of cerebral infarction 12/15/2011   Habitual alcohol use 12/01/2011   Carotid artery stenosis,  symptomatic 11/30/2011   Cerebral infarction (HCC) 11/29/2011   Mixed hyperlipidemia     PCP: Glenetta Hew  REFERRING PROVIDER: Glenetta Hew  REFERRING DIAG: weakness, multiple falls  Rationale for Evaluation and Treatment: Rehabilitation  THERAPY DIAG:  Other abnormalities of gait and mobility  Acute pain of right knee  Muscle weakness (generalized)  Repeated falls  Contracture of muscle, multiple sites  ONSET DATE: 10/07/22  SUBJECTIVE:  SUBJECTIVE STATEMENT: No falls or pain  PERTINENT HISTORY:  Cerebral infarct 10/21/21, lumbar fusion, recurrent falls, has significant neuropathy and this is the reason for most of his falls   PAIN:  Are you having pain? Yes: NPRS scale: 0/10 Pain location: right knee Pain description: ache  Aggravating factors: being on my feet  Relieving factors: movement  PRECAUTIONS: Fall  WEIGHT BEARING RESTRICTIONS: No  FALLS:  Has patient fallen in last 6 months? Yes. Number of falls every few days   LIVING ENVIRONMENT: Lives with: lives with their spouse Lives in: House/apartment Stairs: Yes: Internal: 20 steps; on right going up Has following equipment at home: Dan Humphreys - 4 wheeled, Wheelchair (manual), shower chair, Tour manager, Grab bars, Ramped entry, and trekking pole  OCCUPATION: Retired  PLOF: Independent, prior to a year ago he was driving used trekking poles to walk  PATIENT GOALS: live at home  NEXT MD VISIT:   OBJECTIVE:   DIAGNOSTIC FINDINGS:    LUMBAR SPINE FINDINGS: 1. Wide laminectomy and posterior pedicle screw and rod fixation at L4-5 without residual or recurrent stenosis. 2. Progressive adjacent level disease at L3-4 with moderate right and mild left subarticular narrowing and moderate foraminal stenosis bilaterally.  Progression is predominantly due to facet disease 3. Progressive moderate facet hypertrophy at L1-2 with a progressive rightward disc protrusion resulting in progressive moderate right foraminal stenosis. Asymmetric endplate marrow edema on the right at L1-2 is consistent with progressive disease as well. 4. Otherwise stable degenerative change without other focal stenosis.   THORACIC SPINE FINDINGS:  On sagittal views the vertebral bodies have normal height and alignment.  Hemangioma within T9 vertebral body and slightly within T10 vertebral body.  Anterior kyphosis.  Mild scoliosis convex right centered at T8 level. The spinal cord is normal in size and appearance. The paraspinal soft tissues are unremarkable.     On axial views there is no spinal stenosis or foraminal narrowing.  Limited views of the aorta, kidneys, liver, lungs and paraspinal muscles are notable for multiple large right renal cysts ranging in size from 1.5 to 7 cm.   COGNITION: Overall cognitive status: Within functional limits for tasks assessed     SENSATION: WFL  MUSCLE LENGTH: Hamstrings: mod tightness on both sides  POSTURE: rounded shoulders and forward head  LOWER EXTREMITY ROM:   the right knee has HS, has no quad tendon on the right, unable to lift the leg, left leg WFL's , left hip and right hip and ankles WFL's   LOWER EXTREMITY MMT: 4-/5 for the LE's except for the right knee extension UPPER EXTREMITY MMT:  4-/5 for shoulders and elbows   FUNCTIONAL TESTS:  5XSTS: 83 seconds really has to use his hands and is unsteady with standing Cannot stand on his own without holding onto something due to poor balance   Transfers:  needs set up and MinA for safety, is impulsive and unsafe at times. GAIT: He is w/c dependent    TODAY'S TREATMENT:  DATE:  06/29/23 NuStep L6 x23 min,  x 7 min LE only  15# in w/c straight arm pulls 15# biceps curls  06/27/23 NuStep L6 x6 min, x 4 min LE only  15# in w/c straight arm pulls 15# rows 15# biceps curls 10# chest press Overhead Triceps  ext 2x15 Chest flys 10lb 2x15 WC dips x5, x5, x10 Hs curls blue 2x10  05/2823 UBE L1 x 2 min each  NuStep L6 x4 min, x LE only  Seated at mat without Back support LAQ 5lb 2x15 Seated march 5lb 2x15   UE MMT grosley 4+/5  Blue ball  OHP 2x10  Seated trunk rotations blue ball 2x15  HS curls blue 2x15   06/20/23 NuStep L 5 x 7 min, x4 min LE only RLE hip abd and flex in // bars 2x10 15# in w/c straight arm pulls 15# rows 15# biceps curls 10# chest press At Mat table  Seated trunk rotations blue ball 2x15  Blue ball  OHP 2x10  Horiz abd blue 2x15  06/12/23 NuStep L 5 x 7 min, x3 min LE only At Mat table   STM w/ thea gun to L lateral thigh  Blue ball  OHP 2x15  HS Curl green LLE x10, RLE 2x10  Horiz abd blue 2x10  LAQ LLE 5lb  2x15  Seated trunk rotations blue ball 2x15   06/09/23 NuStep L 5 x 7 min, x4 min LE only 15# in w/c straight arm pulls 15# rows 10# chest press At Mat table   4lb OHP   HS Curle green 2x10  LAQ 5lb 2x10  Seated trunk rotations blue ball 3x10  Horiz shoulder Abd blue 2x10  06/07/23 NuStep L 5 x 7 min, x4 min LE only 15# in w/c straight arm pulls 15# rows 10# chest press 20# biceps curls Leg press 40# 2x15 AT Mat table   4lb OHP   Crunches   Horiz shoulder Abd green 2x10  Seated march no UE support  06/01/23 At Mat table 5XSTS to The back of WC: 64 seconds really has to use his hands and is unsteady with standing Cannot stand on his own without holding onto something due to not having a L quad tendon MMT RUE flex 4/5 abd 4-/5, LUE Flex 4/5, Abd 4/5 Seated military press 5lb 2x10 Seated rows blue 2x15 Horiz abd green 2x15 HS curls green x15, blue x 15  Seated crunches 2x15 Nustep level 6 10 minutes 3 minutes with LE's  only  05/29/23 Bike level 5 x 6 minutes 15# in w/c straight arm pulls 15# rows 10# chest press 20# biceps curls 10# overhead press 55# leg curls both 2x10, right only 25# 2x10 Leg extension left only 10# 2x10 LEg press 40# 2x10, then single legs 2x10, right leg was eccentrics Pball in lap isometric abs Nustep level 6 LE only 2x10  05/25/23 Nustep level 6 10 minutes 3 minutes with LE's only In pbars standing marches with PT clocking knees In pbars standing balance with PT close to knees if they give Leg curls 55# 2x10 both legs, right leg only 25# 2 x10 LEg extension left only 10# 3x10 15# straight arm pulls 15# rows 10# chest press Biceps 15# and 20# 2x10 10# overhead press  05/22/23 Nustep level 6 x 10 minute, 3 without UE Seated Crunches 2x15 Seated weight overhead ball toss 2x15 LAQ LLE 5lb 2x15 Seated march 5lb 2x10 HS curls blue 2x15 Seated blue ball OHP 3x10 Horiz abd blue  3x10 Shoulder Ext 10lb 2x15  05/17/23 Nustep level 6 x 10 minute, 3 without UE 10# straight arm pulls in w/c 3x10 10# chest press in w/c 3x10 Shoulder abd 3lb x10, 1lb x10 Leg press 50# 3x10, Heel raises 50lb 3x10 PTA blocking R knee Seated Ab sets  Seated Pball roll outs for lower back stretch   05/15/23 Nustep level 6 x 13 minute, 3 without UE 10# straight arm pulls in w/c 3x10 10# chest press in w/c 3x10 Leg press 40# 3x10 Standing hip Flex And Ext  in RW x10 eaxh PTA blocking RLE to prevent buckling Seated on Mat OHP 12lb WaTE 2x12 Seated Trunk rotations Blue 2x12   05/12/23 Nustep level 6 x 10 minutes, 3.5 minutes without arms STS in // bars 7 x working on full ext of knees esp left cg A Static standing in // bars with wt shifts CGA and cuing Standing in // bars with PTA supporting LLE and RT LE stepping fwd 10x then laterally 10 x Leg press 40# 3x10- cued for full TKE and slower controlled speed 10# straight arm pulls in w/c 2 x 15 Blue tband chest press in w/c 3x10 then  flys 10# biceps curls in w/c 2 sets 15 Leg curls 35# 10x , 45# 2 sets 10- cued for full ROM Seated OH ball press 2 sets 10 - blue ball Seated blue ball core work   PATIENT EDUCATION:  Education details: POC Person educated: Patient Education method: Explanation Education comprehension: verbalized understanding  HOME EXERCISE PROGRAM:  ASSESSMENT:  CLINICAL IMPRESSION: Pt enters 45 min early, and requested extended time on NuStep. He appears to be discouraged because he was referred to another MD and that MD told him he could not help him and was referred elsewhere. Pt stated he was told that he needed a locking brace. PT completed th above interventions before deciding to stop session due to being discouraged.  Patient is a 83 y.o. male who was seen today for physical therapy evaluation and treatment for repeated falls and balance issues, he has had 3 quadriceps tendon ruptures in the past year, his last rupture was not repaired so he has no active right knee extension.  He reports repeated falls, falling every few days. Pt I very impulsive and unsafe with transfers, he is no longer walking and is w/c dependent.   He will benefit from skilled PT to decrease his risk for falls and address his overall strength, function and safety   OBJECTIVE IMPAIRMENTS: Abnormal gait, cardiopulmonary status limiting activity, decreased activity tolerance, decreased balance, decreased coordination, decreased endurance, decreased mobility, difficulty walking, decreased ROM, decreased strength, improper body mechanics, postural dysfunction, and poor safety .   REHAB POTENTIAL: Fair dependent on carry over and patient compliance  CLINICAL DECISION MAKING: Evolving/moderate complexity  EVALUATION COMPLEXITY: Moderate  GOALS: Goals reviewed with patient? Yes  SHORT TERM GOALS: Target date: 05/29/23  Patient will be independent with initial HEP. Goal status: Met  05/10/23  LONG TERM GOALS: Target date:  07/29/23  Patient will be independent with advanced/ongoing HEP to improve outcomes and carryover.  Goal status: INITIAL  2.  Understand the need for safety with his transfers and all the steps to be as safe as possible Goal status: Ongoing 06/01/23  3.  Patient will demonstrate improved functional LE strength as demonstrated by < 25s on 5xSTS. Baseline: 83 seconds had to use hands and was unable to be independent with the standing Goal status: Progressing 06/01/23  4.  Increase UE strength to 4+/5 for better transfers and function Goal status: Progressing  06/01/23  5.  Patient will report no falls in a 4 weeks period. Baseline: falls every few days Goal status: Ongoing 06/01/23 fall last "20 or 21" of Feburary, Progressing 06/23/23  PLAN:  PT FREQUENCY: 2x/week  PT DURATION: 12 weeks  PLANNED INTERVENTIONS: Therapeutic exercises, Therapeutic activity, Neuromuscular re-education, Balance training, Gait training, Patient/Family education, Self Care, Joint mobilization, Stair training, Cryotherapy, Moist heat, and Manual therapy.  PLAN FOR NEXT SESSION: needs total body strength to help with his safety in transfers and independence.   Grayce Sessions, PTA

## 2023-06-30 ENCOUNTER — Telehealth: Payer: Self-pay | Admitting: Neurology

## 2023-06-30 NOTE — Telephone Encounter (Signed)
 Pt called wanting to speak to the RN regarding a new script for his cpap supplies. Please advise.

## 2023-07-03 ENCOUNTER — Ambulatory Visit: Admitting: Physical Therapy

## 2023-07-03 ENCOUNTER — Ambulatory Visit (INDEPENDENT_AMBULATORY_CARE_PROVIDER_SITE_OTHER): Payer: Medicare Other

## 2023-07-03 ENCOUNTER — Encounter: Payer: Self-pay | Admitting: Physical Therapy

## 2023-07-03 DIAGNOSIS — I480 Paroxysmal atrial fibrillation: Secondary | ICD-10-CM

## 2023-07-03 DIAGNOSIS — R2689 Other abnormalities of gait and mobility: Secondary | ICD-10-CM | POA: Diagnosis not present

## 2023-07-03 DIAGNOSIS — M6281 Muscle weakness (generalized): Secondary | ICD-10-CM

## 2023-07-03 DIAGNOSIS — M25561 Pain in right knee: Secondary | ICD-10-CM

## 2023-07-03 LAB — CUP PACEART REMOTE DEVICE CHECK
Date Time Interrogation Session: 20250406230703
Implantable Pulse Generator Implant Date: 20220728

## 2023-07-03 NOTE — Therapy (Signed)
 OUTPATIENT PHYSICAL THERAPY TREATMENT     Patient Name: Brandon Mendoza. MRN: [id number] DOB:1940-04-12, 83 y.o., male Today's Date: 07/03/2023  END OF SESSION:  PT End of Session - 07/03/23 0852     Visit Number 18    Date for PT Re-Evaluation 07/29/23    PT Start Time 0845    PT Stop Time 0930    PT Time Calculation (min) 45 min    Activity Tolerance Patient tolerated treatment well    Behavior During Therapy [facility] for tasks assessed/performed;Impulsive             Past Medical History:  Diagnosis Date   Acquired clawfoot, right foot 12/16/2021   Anxiety    Atrophy of calf muscles 10/21/2021   Duplex calves was negative for significant blockage EMG was done at [facility] MRI lumbar spine was also done at [facility]   Carotid artery occlusion    CKD (chronic kidney disease)    Stage 3   COPD (chronic obstructive pulmonary disease) (HCC)    Depression    Dysrhythmia    PAF, atrial tachycardia   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   The spinal cord appears normal. 2.   No spinal stenosis. 3.   Mild multilevel degenerative changes as detailed above that do not lead to spinal stenosis or nerve root compression. 4.   T2 hyperintense foci within the pons consistent with chronic microvascular ischemic changes.   Loop - Medtronic Linq 10/22/2020 10/22/2020   Nocturia    Paroxysmal atrial fibrillation (HCC)    Peripheral neuropathy    Peripheral vascular disease (HCC)    Sleep apnea    on 6 cm, nasal pillow   Spinal stenosis    Stroke (HCC) 05/01/2011   ischemic   Weakness generalized 10/21/2021   Severe, legs and arms   Past Surgical History:  Procedure Laterality Date   CAROTID ENDARTERECTOMY  12/09/11   Left cea   ENDARTERECTOMY   12/09/2011   Procedure: ENDARTERECTOMY CAROTID;  Surgeon: Larina Earthly, MD;  Location: [facility] OR;  Service: Vascular;  Laterality: Left;   EYE SURGERY     rt retina detachment   HAMMER TOE SURGERY  2004   HERNIA REPAIR  2006   I & D EXTREMITY Right 10/07/2022   Procedure: IRRIGATION AND DEBRIDEMENT EXTREMITY WITH WOUND CLOSURE;  Surgeon: Venita Lick, MD;  Location: [facility] ORS;  Service: Orthopedics;  Laterality: Right;   QUADRICEPS TENDON REPAIR Right 07/12/2022   Procedure: REPAIR QUADRICEP TENDON;  Surgeon: Durene Romans, MD;  Location: [facility] ORS;  Service: Orthopedics;  Laterality: Right;   QUADRICEPS TENDON REPAIR Right 08/30/2022   Procedure: REPAIR QUADRICEP TENDON;  Surgeon: Durene Romans, MD;  Location: [facility] ORS;  Service: Orthopedics;  Laterality: Right;   SHOULDER ARTHROSCOPY W/ ROTATOR CUFF REPAIR  2010   TONSILLECTOMY     Patient Active Problem List   Diagnosis Date Noted   Coronary artery calcification 05/09/2023   Rupture of quadriceps tendon 11/03/2022   Wound dehiscence 10/07/2022   Quadriceps tendon rupture, right, subsequent encounter 08/30/2022   Quadriceps tendon rupture, right, initial encounter 07/12/2022   Unable to care for self 07/08/2022   Dehydration 07/08/2022   Depression 07/08/2022  COPD (chronic obstructive pulmonary disease) (HCC) 07/08/2022   Sialoadenitis of submandibular gland 04/28/2022   Macular degeneration 03/01/2022   Myofascial pain 02/10/2022   Lumbar spondylosis 01/03/2022   Acquired clawfoot, left foot 12/16/2021   Acquired clawfoot, right foot 12/16/2021   Claw foot 12/14/2021   Gait abnormality 12/10/2021   Spinal stenosis at L4-L5 level 12/10/2021   Ataxia 11/22/2021   Lightheadedness 11/02/2021   Claudication of lower extremity (HCC) 10/21/2021   Atrophy of calf muscles 10/21/2021   Weakness generalized 10/21/2021   Focal neurological deficit 10/21/2021   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   Pain in joint of  right hip 09/14/2021   High arches 07/22/2021   Hammertoes of both feet 07/22/2021   Neuropathy 07/22/2021   Constipation 05/28/2021   Disequilibrium 05/28/2021   History of colonic polyps 05/28/2021   Renal cyst 05/28/2021   Gastroesophageal reflux disease without esophagitis 05/05/2021   Ingrowing left great toenail 02/15/2021   S/P lumbar fusion 12/24/2020   Spinal stenosis of lumbar region 12/04/2020   Acquired complex renal cyst 11/23/2020   Functional gait abnormality 11/04/2020   Idiopathic neuropathy 11/02/2020   Tinea pedis of both feet 10/28/2020   Loop - Medtronic Linq 10/22/2020 10/22/2020   Benign hypertension with 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) 10/05/2020   Fall with injury 10/05/2020   Hematoma 09/16/2020   Normocytic anemia 09/14/2020   Leukocytosis 09/14/2020   AF (paroxysmal atrial fibrillation) (HCC) 09/14/2020   Chronic anticoagulation 09/14/2020   Hyponatremia 09/14/2020   Pain in left foot 01/22/2020   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   Pain in joint of right knee 12/27/2019   Recurrent falls 11/17/2019   Paroxysmal atrial fibrillation (HCC) 10/22/2019   Pain in right foot 10/14/2019   Falls 10/07/2019   Ataxia involving legs 08/12/2019   OSA on CPAP 06/20/2018   Urge incontinence 04/13/2018   Urinary urgency 04/13/2018   Pain in left knee 03/01/2018   Other intervertebral disc degeneration, lumbar region 06/16/2017   Acquired bilateral hammer toes 10/05/2015   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   Generalized anxiety disorder 05/12/2015   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   History of left-sided carotid endarterectomy 04/10/2014   Essential hypertension 04/10/2014   Occlusion and stenosis of carotid artery without mention of cerebral infarction 12/15/2011   Habitual alcohol use 12/01/2011   Carotid artery stenosis,  symptomatic 11/30/2011   Cerebral infarction (HCC) 11/29/2011   Mixed hyperlipidemia     PCP: Glenetta Hew  REFERRING PROVIDER: Glenetta Hew  REFERRING DIAG: weakness, multiple falls  Rationale for Evaluation and Treatment: Rehabilitation  THERAPY DIAG:  Other abnormalities of gait and mobility  Acute pain of right knee  Muscle weakness (generalized)  ONSET DATE: 10/07/22  SUBJECTIVE:  SUBJECTIVE STATEMENT: No falls or pain, Getting his bace resized Monday  PERTINENT HISTORY:  Cerebral infarct 10/21/21, lumbar fusion, recurrent falls, has significant neuropathy and this is the reason for most of his falls   PAIN:  Are you having pain? Yes: NPRS scale: 0/10 Pain location: right knee Pain description: ache  Aggravating factors: being on my feet  Relieving factors: movement  PRECAUTIONS: Fall  WEIGHT BEARING RESTRICTIONS: No  FALLS:  Has patient fallen in last 6 months? Yes. Number of falls every few days   LIVING ENVIRONMENT: Lives with: lives with their spouse Lives in: House/apartment Stairs: Yes: Internal: 20 steps; on right going up Has following equipment at home: Dan Humphreys - 4 wheeled, Wheelchair (manual), shower chair, Tour manager, Grab bars, Ramped entry, and trekking pole  OCCUPATION: Retired  PLOF: Independent, prior to a year ago he was driving used trekking poles to walk  PATIENT GOALS: live at home  NEXT MD VISIT:   OBJECTIVE:   DIAGNOSTIC FINDINGS:    LUMBAR SPINE FINDINGS: 1. Wide laminectomy and posterior pedicle screw and rod fixation at L4-5 without residual or recurrent stenosis. 2. Progressive adjacent level disease at L3-4 with moderate right and mild left subarticular narrowing and moderate foraminal stenosis bilaterally. Progression is predominantly  due to facet disease 3. Progressive moderate facet hypertrophy at L1-2 with a progressive rightward disc protrusion resulting in progressive moderate right foraminal stenosis. Asymmetric endplate marrow edema on the right at L1-2 is consistent with progressive disease as well. 4. Otherwise stable degenerative change without other focal stenosis.   THORACIC SPINE FINDINGS:  On sagittal views the vertebral bodies have normal height and alignment.  Hemangioma within T9 vertebral body and slightly within T10 vertebral body.  Anterior kyphosis.  Mild scoliosis convex right centered at T8 level. The spinal cord is normal in size and appearance. The paraspinal soft tissues are unremarkable.     On axial views there is no spinal stenosis or foraminal narrowing.  Limited views of the aorta, kidneys, liver, lungs and paraspinal muscles are notable for multiple large right renal cysts ranging in size from 1.5 to 7 cm.   COGNITION: Overall cognitive status: Within functional limits for tasks assessed     SENSATION: WFL  MUSCLE LENGTH: Hamstrings: mod tightness on both sides  POSTURE: rounded shoulders and forward head  LOWER EXTREMITY ROM:   the right knee has HS, has no quad tendon on the right, unable to lift the leg, left leg WFL's , left hip and right hip and ankles WFL's   LOWER EXTREMITY MMT: 4-/5 for the LE's except for the right knee extension UPPER EXTREMITY MMT:  4-/5 for shoulders and elbows   FUNCTIONAL TESTS:  5XSTS: 83 seconds really has to use his hands and is unsteady with standing Cannot stand on his own without holding onto something due to poor balance   Transfers:  needs set up and MinA for safety, is impulsive and unsafe at times. GAIT: He is w/c dependent    TODAY'S TREATMENT:  DATE:  07/03/23 NuStep L6 x7 min, x  L5 4 min LE only Shoulder  Ext in w/c 15lb  10# biceps curls 15# rows  Chest press 10 2x15 Fitter press 2 blue 1 black 2x15 Sit to stand for elevated surface to w/c HS curls blue 2x15   06/29/23 NuStep L6 x23 min, x 7 min LE only  15# in w/c straight arm pulls 15# biceps curls  06/27/23 NuStep L6 x6 min, x 4 min LE only  15# in w/c straight arm pulls 15# rows 15# biceps curls 10# chest press Overhead Triceps  ext 2x15 Chest flys 10lb 2x15 WC dips x5, x5, x10 Hs curls blue 2x10  05/2823 UBE L1 x 2 min each  NuStep L6 x4 min, x LE only  Seated at mat without Back support LAQ 5lb 2x15 Seated march 5lb 2x15   UE MMT grosley 4+/5  Blue ball  OHP 2x10  Seated trunk rotations blue ball 2x15  HS curls blue 2x15   06/20/23 NuStep L 5 x 7 min, x4 min LE only RLE hip abd and flex in // bars 2x10 15# in w/c straight arm pulls 15# rows 15# biceps curls 10# chest press At Mat table  Seated trunk rotations blue ball 2x15  Blue ball  OHP 2x10  Horiz abd blue 2x15  06/12/23 NuStep L 5 x 7 min, x3 min LE only At Mat table   STM w/ thea gun to L lateral thigh  Blue ball  OHP 2x15  HS Curl green LLE x10, RLE 2x10  Horiz abd blue 2x10  LAQ LLE 5lb  2x15  Seated trunk rotations blue ball 2x15   06/09/23 NuStep L 5 x 7 min, x4 min LE only 15# in w/c straight arm pulls 15# rows 10# chest press At Mat table   4lb OHP   HS Curle green 2x10  LAQ 5lb 2x10  Seated trunk rotations blue ball 3x10  Horiz shoulder Abd blue 2x10  06/07/23 NuStep L 5 x 7 min, x4 min LE only 15# in w/c straight arm pulls 15# rows 10# chest press 20# biceps curls Leg press 40# 2x15 AT Mat table   4lb OHP   Crunches   Horiz shoulder Abd green 2x10  Seated march no UE support  06/01/23 At Mat table 5XSTS to The back of WC: 64 seconds really has to use his hands and is unsteady with standing Cannot stand on his own without holding onto something due to not having a L quad tendon MMT RUE flex 4/5 abd 4-/5, LUE Flex 4/5,  Abd 4/5 Seated military press 5lb 2x10 Seated rows blue 2x15 Horiz abd green 2x15 HS curls green x15, blue x 15  Seated crunches 2x15 Nustep level 6 10 minutes 3 minutes with LE's only  05/29/23 Bike level 5 x 6 minutes 15# in w/c straight arm pulls 15# rows 10# chest press 20# biceps curls 10# overhead press 55# leg curls both 2x10, right only 25# 2x10 Leg extension left only 10# 2x10 LEg press 40# 2x10, then single legs 2x10, right leg was eccentrics Pball in lap isometric abs Nustep level 6 LE only 2x10  05/25/23 Nustep level 6 10 minutes 3 minutes with LE's only In pbars standing marches with PT clocking knees In pbars standing balance with PT close to knees if they give Leg curls 55# 2x10 both legs, right leg only 25# 2 x10 LEg extension left only 10# 3x10 15# straight arm pulls 15# rows 10# chest  press Biceps 15# and 20# 2x10 10# overhead press  05/22/23 Nustep level 6 x 10 minute, 3 without UE Seated Crunches 2x15 Seated weight overhead ball toss 2x15 LAQ LLE 5lb 2x15 Seated march 5lb 2x10 HS curls blue 2x15 Seated blue ball OHP 3x10 Horiz abd blue 3x10 Shoulder Ext 10lb 2x15  PATIENT EDUCATION:  Education details: POC Person educated: Patient Education method: Explanation Education comprehension: verbalized understanding  HOME EXERCISE PROGRAM:  ASSESSMENT:  CLINICAL IMPRESSION: Pt enters dong well reporting no falls since last session. He reports that's he will be sized for his locking brace on Monday. Good effort throughout session. Some UE fatigue repots with chest press. He is able to complete sit to stands but it isn't functional.  He dies reports some L quad tendon discomfort with sit to stands.     Patient is a 83 y.o. male who was seen today for physical therapy evaluation and treatment for repeated falls and balance issues, he has had 3 quadriceps tendon ruptures in the past year, his last rupture was not repaired so he has no active right knee  extension.  He reports repeated falls, falling every few days. Pt I very impulsive and unsafe with transfers, he is no longer walking and is w/c dependent.   He will benefit from skilled PT to decrease his risk for falls and address his overall strength, function and safety   OBJECTIVE IMPAIRMENTS: Abnormal gait, cardiopulmonary status limiting activity, decreased activity tolerance, decreased balance, decreased coordination, decreased endurance, decreased mobility, difficulty walking, decreased ROM, decreased strength, improper body mechanics, postural dysfunction, and poor safety .   REHAB POTENTIAL: Fair dependent on carry over and patient compliance  CLINICAL DECISION MAKING: Evolving/moderate complexity  EVALUATION COMPLEXITY: Moderate  GOALS: Goals reviewed with patient? Yes  SHORT TERM GOALS: Target date: 05/29/23  Patient will be independent with initial HEP. Goal status: Met  05/10/23  LONG TERM GOALS: Target date: 07/29/23  Patient will be independent with advanced/ongoing HEP to improve outcomes and carryover.  Goal status: INITIAL  2.  Understand the need for safety with his transfers and all the steps to be as safe as possible Goal status: Ongoing 06/01/23  3.  Patient will demonstrate improved functional LE strength as demonstrated by < 25s on 5xSTS. Baseline: 83 seconds had to use hands and was unable to be independent with the standing Goal status: Progressing 06/01/23  4. Increase UE strength to 4+/5 for better transfers and function Goal status: Progressing  06/01/23  5.  Patient will report no falls in a 4 weeks period. Baseline: falls every few days Goal status: Ongoing 06/01/23 fall last "20 or 21" of Feburary, Progressing 06/23/23  PLAN:  PT FREQUENCY: 2x/week  PT DURATION: 12 weeks  PLANNED INTERVENTIONS: Therapeutic exercises, Therapeutic activity, Neuromuscular re-education, Balance training, Gait training, Patient/Family education, Self Care, Joint mobilization,  Stair training, Cryotherapy, Moist heat, and Manual therapy.  PLAN FOR NEXT SESSION: needs total body strength to help with his safety in transfers and independence.   Grayce Sessions, PTA

## 2023-07-03 NOTE — Telephone Encounter (Signed)
 Called pt. Unable to do appt tomorrow. Scheduled for 07/06/23 at 8:30a with Amy as mychart VV. Explained to pt how to log in and complete visit.

## 2023-07-05 ENCOUNTER — Encounter: Payer: Self-pay | Admitting: *Deleted

## 2023-07-05 ENCOUNTER — Encounter: Payer: Self-pay | Admitting: Physical Therapy

## 2023-07-05 ENCOUNTER — Ambulatory Visit (INDEPENDENT_AMBULATORY_CARE_PROVIDER_SITE_OTHER): Payer: Medicare Other

## 2023-07-05 ENCOUNTER — Ambulatory Visit: Admitting: Physical Therapy

## 2023-07-05 VITALS — BP 137/78 | Ht 68.0 in | Wt 190.0 lb

## 2023-07-05 DIAGNOSIS — R296 Repeated falls: Secondary | ICD-10-CM

## 2023-07-05 DIAGNOSIS — Z0000 Encounter for general adult medical examination without abnormal findings: Secondary | ICD-10-CM

## 2023-07-05 DIAGNOSIS — R2689 Other abnormalities of gait and mobility: Secondary | ICD-10-CM | POA: Diagnosis not present

## 2023-07-05 DIAGNOSIS — M25561 Pain in right knee: Secondary | ICD-10-CM

## 2023-07-05 DIAGNOSIS — M6281 Muscle weakness (generalized): Secondary | ICD-10-CM

## 2023-07-05 DIAGNOSIS — M6249 Contracture of muscle, multiple sites: Secondary | ICD-10-CM

## 2023-07-05 DIAGNOSIS — M5459 Other low back pain: Secondary | ICD-10-CM

## 2023-07-05 NOTE — Therapy (Signed)
 OUTPATIENT PHYSICAL THERAPY TREATMENT     Patient Name: Brandon Mendoza. MRN: [id number] DOB:1941/01/01, 83 y.o., male Today's Date: 07/05/2023  END OF SESSION:  PT End of Session - 07/05/23 0851     Visit Number 19    Date for PT Re-Evaluation 07/29/23    Authorization Type Medicare    PT Start Time 0845    PT Stop Time 0930    PT Time Calculation (min) 45 min    Activity Tolerance Patient tolerated treatment well    Behavior During Therapy [facility] for tasks assessed/performed;Impulsive             Past Medical History:  Diagnosis Date   Acquired clawfoot, right foot 12/16/2021   Anxiety    Atrophy of calf muscles 10/21/2021   Duplex calves was negative for significant blockage EMG was done at [facility] MRI lumbar spine was also done at [facility]   Carotid artery occlusion    CKD (chronic kidney disease)    Stage 3   COPD (chronic obstructive pulmonary disease) (HCC)    Depression    Dysrhythmia    PAF, atrial tachycardia   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   The spinal cord appears normal. 2.   No spinal stenosis. 3.   Mild multilevel degenerative changes as detailed above that do not lead to spinal stenosis or nerve root compression. 4.   T2 hyperintense foci within the pons consistent with chronic microvascular ischemic changes.   Loop - Medtronic Linq 10/22/2020 10/22/2020   Nocturia    Paroxysmal atrial fibrillation (HCC)    Peripheral neuropathy    Peripheral vascular disease (HCC)    Sleep apnea    on 6 cm, nasal pillow   Spinal stenosis    Stroke (HCC) 05/01/2011   ischemic   Weakness generalized 10/21/2021   Severe, legs and arms   Past Surgical History:  Procedure Laterality Date   CAROTID ENDARTERECTOMY  12/09/11   Left  cea   ENDARTERECTOMY  12/09/2011   Procedure: ENDARTERECTOMY CAROTID;  Surgeon: Larina Earthly, MD;  Location: [facility] OR;  Service: Vascular;  Laterality: Left;   EYE SURGERY     rt retina detachment   HAMMER TOE SURGERY  2004   HERNIA REPAIR  2006   I & D EXTREMITY Right 10/07/2022   Procedure: IRRIGATION AND DEBRIDEMENT EXTREMITY WITH WOUND CLOSURE;  Surgeon: Venita Lick, MD;  Location: [facility] ORS;  Service: Orthopedics;  Laterality: Right;   QUADRICEPS TENDON REPAIR Right 07/12/2022   Procedure: REPAIR QUADRICEP TENDON;  Surgeon: Durene Romans, MD;  Location: [facility] ORS;  Service: Orthopedics;  Laterality: Right;   QUADRICEPS TENDON REPAIR Right 08/30/2022   Procedure: REPAIR QUADRICEP TENDON;  Surgeon: Durene Romans, MD;  Location: [facility] ORS;  Service: Orthopedics;  Laterality: Right;   SHOULDER ARTHROSCOPY W/ ROTATOR CUFF REPAIR  2010   TONSILLECTOMY     Patient Active Problem List   Diagnosis Date Noted   Coronary artery calcification 05/09/2023   Rupture of quadriceps tendon 11/03/2022   Wound dehiscence 10/07/2022   Quadriceps tendon rupture, right, subsequent encounter 08/30/2022   Quadriceps tendon rupture, right, initial encounter 07/12/2022   Unable to care for self 07/08/2022  Dehydration 07/08/2022   Depression 07/08/2022   COPD (chronic obstructive pulmonary disease) (HCC) 07/08/2022   Sialoadenitis of submandibular gland 04/28/2022   Macular degeneration 03/01/2022   Myofascial pain 02/10/2022   Lumbar spondylosis 01/03/2022   Acquired clawfoot, left foot 12/16/2021   Acquired clawfoot, right foot 12/16/2021   Claw foot 12/14/2021   Gait abnormality 12/10/2021   Spinal stenosis at L4-L5 level 12/10/2021   Ataxia 11/22/2021   Lightheadedness 11/02/2021   Claudication of lower extremity (HCC) 10/21/2021   Atrophy of calf muscles 10/21/2021   Weakness generalized 10/21/2021   Focal neurological deficit 10/21/2021   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   Memory impairment of gradual onset  10/21/2021   Pain in joint of right hip 09/14/2021   High arches 07/22/2021   Hammertoes of both feet 07/22/2021   Neuropathy 07/22/2021   Constipation 05/28/2021   Disequilibrium 05/28/2021   History of colonic polyps 05/28/2021   Renal cyst 05/28/2021   Gastroesophageal reflux disease without esophagitis 05/05/2021   Ingrowing left great toenail 02/15/2021   S/P lumbar fusion 12/24/2020   Spinal stenosis of lumbar region 12/04/2020   Acquired complex renal cyst 11/23/2020   Functional gait abnormality 11/04/2020   Idiopathic neuropathy 11/02/2020   Tinea pedis of both feet 10/28/2020   Loop - Medtronic Linq 10/22/2020 10/22/2020   Benign hypertension with 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) 10/05/2020   Fall with injury 10/05/2020   Hematoma 09/16/2020   Normocytic anemia 09/14/2020   Leukocytosis 09/14/2020   AF (paroxysmal atrial fibrillation) (HCC) 09/14/2020   Chronic anticoagulation 09/14/2020   Hyponatremia 09/14/2020   Pain in left foot 01/22/2020   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   Pain in joint of right knee 12/27/2019   Recurrent falls 11/17/2019   Paroxysmal atrial fibrillation (HCC) 10/22/2019   Pain in right foot 10/14/2019   Falls 10/07/2019   Ataxia involving legs 08/12/2019   OSA on CPAP 06/20/2018   Urge incontinence 04/13/2018   Urinary urgency 04/13/2018   Pain in left knee 03/01/2018   Other intervertebral disc degeneration, lumbar region 06/16/2017   Acquired bilateral hammer toes 10/05/2015   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   Generalized anxiety disorder 05/12/2015   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   History of left-sided carotid endarterectomy 04/10/2014   Essential hypertension 04/10/2014   Occlusion and stenosis of carotid artery without mention of cerebral infarction 12/15/2011   Habitual alcohol use  12/01/2011   Carotid artery stenosis, symptomatic 11/30/2011   Cerebral infarction (HCC) 11/29/2011   Mixed hyperlipidemia     PCP: Glenetta Hew  REFERRING PROVIDER: Glenetta Hew  REFERRING DIAG: weakness, multiple falls  Rationale for Evaluation and Treatment: Rehabilitation  THERAPY DIAG:  Other abnormalities of gait and mobility  Acute pain of right knee  Muscle weakness (generalized)  Repeated falls  Contracture of muscle, multiple sites  Other low back pain  ONSET DATE: 10/07/22  SUBJECTIVE:  SUBJECTIVE STATEMENT: Patent reports slipped on a transfer getting from chair to bed, reports no injuries, reports that he can get up if wife holds the chair no help from her except to hold chair  PERTINENT HISTORY:  Cerebral infarct 10/21/21, lumbar fusion, recurrent falls, has significant neuropathy and this is the reason for most of his falls   PAIN:  Are you having pain? Yes: NPRS scale: 0/10 Pain location: right knee Pain description: ache  Aggravating factors: being on my feet  Relieving factors: movement  PRECAUTIONS: Fall  WEIGHT BEARING RESTRICTIONS: No  FALLS:  Has patient fallen in last 6 months? Yes. Number of falls every few days   LIVING ENVIRONMENT: Lives with: lives with their spouse Lives in: House/apartment Stairs: Yes: Internal: 20 steps; on right going up Has following equipment at home: Dan Humphreys - 4 wheeled, Wheelchair (manual), shower chair, Tour manager, Grab bars, Ramped entry, and trekking pole  OCCUPATION: Retired  PLOF: Independent, prior to a year ago he was driving used trekking poles to walk  PATIENT GOALS: live at home  NEXT MD VISIT:   OBJECTIVE:   DIAGNOSTIC FINDINGS:    LUMBAR SPINE FINDINGS: 1. Wide laminectomy and posterior pedicle screw  and rod fixation at L4-5 without residual or recurrent stenosis. 2. Progressive adjacent level disease at L3-4 with moderate right and mild left subarticular narrowing and moderate foraminal stenosis bilaterally. Progression is predominantly due to facet disease 3. Progressive moderate facet hypertrophy at L1-2 with a progressive rightward disc protrusion resulting in progressive moderate right foraminal stenosis. Asymmetric endplate marrow edema on the right at L1-2 is consistent with progressive disease as well. 4. Otherwise stable degenerative change without other focal stenosis.   THORACIC SPINE FINDINGS:  On sagittal views the vertebral bodies have normal height and alignment.  Hemangioma within T9 vertebral body and slightly within T10 vertebral body.  Anterior kyphosis.  Mild scoliosis convex right centered at T8 level. The spinal cord is normal in size and appearance. The paraspinal soft tissues are unremarkable.     On axial views there is no spinal stenosis or foraminal narrowing.  Limited views of the aorta, kidneys, liver, lungs and paraspinal muscles are notable for multiple large right renal cysts ranging in size from 1.5 to 7 cm.   COGNITION: Overall cognitive status: Within functional limits for tasks assessed     SENSATION: WFL  MUSCLE LENGTH: Hamstrings: mod tightness on both sides  POSTURE: rounded shoulders and forward head  LOWER EXTREMITY ROM:   the right knee has HS, has no quad tendon on the right, unable to lift the leg, left leg WFL's , left hip and right hip and ankles WFL's   LOWER EXTREMITY MMT: 4-/5 for the LE's except for the right knee extension UPPER EXTREMITY MMT:  4-/5 for shoulders and elbows   FUNCTIONAL TESTS:  5XSTS: 83 seconds really has to use his hands and is unsteady with standing Cannot stand on his own without holding onto something due to poor balance   Transfers:  needs set up and MinA for safety, is impulsive and unsafe at  times. GAIT: He is w/c dependent    TODAY'S TREATMENT:  DATE:  07/05/23 Nustep level 6 x 7 minutes, level 5 LE only x 5 minutes Biceps 15#  Rows 15# Extension 15# Feet on ball trunk rotation, bridges, isometric abs Black tband clamshells in [employer] squeeze with bridge In Pbars high knee raises PT blocking the right knee  07/03/23 NuStep L6 x7 min, x  L5 4 min LE only Shoulder Ext in w/c 15lb  10# biceps curls 15# rows  Chest press 10 2x15 Fitter press 2 blue 1 black 2x15 Sit to stand for elevated surface to w/c HS curls blue 2x15   06/29/23 NuStep L6 x23 min, x 7 min LE only  15# in w/c straight arm pulls 15# biceps curls  06/27/23 NuStep L6 x6 min, x 4 min LE only  15# in w/c straight arm pulls 15# rows 15# biceps curls 10# chest press Overhead Triceps  ext 2x15 Chest flys 10lb 2x15 WC dips x5, x5, x10 Hs curls blue 2x10  05/2823 UBE L1 x 2 min each  NuStep L6 x4 min, x LE only  Seated at mat without Back support LAQ 5lb 2x15 Seated march 5lb 2x15   UE MMT grosley 4+/5  Blue ball  OHP 2x10  Seated trunk rotations blue ball 2x15  HS curls blue 2x15   06/20/23 NuStep L 5 x 7 min, x4 min LE only RLE hip abd and flex in // bars 2x10 15# in w/c straight arm pulls 15# rows 15# biceps curls 10# chest press At Mat table  Seated trunk rotations blue ball 2x15  Blue ball  OHP 2x10  Horiz abd blue 2x15  06/12/23 NuStep L 5 x 7 min, x3 min LE only At Mat table   STM w/ thea gun to L lateral thigh  Blue ball  OHP 2x15  HS Curl green LLE x10, RLE 2x10  Horiz abd blue 2x10  LAQ LLE 5lb  2x15  Seated trunk rotations blue ball 2x15   06/09/23 NuStep L 5 x 7 min, x4 min LE only 15# in w/c straight arm pulls 15# rows 10# chest press At Mat table   4lb OHP   HS Curle green 2x10  LAQ 5lb 2x10  Seated trunk rotations blue ball  3x10  Horiz shoulder Abd blue 2x10  06/07/23 NuStep L 5 x 7 min, x4 min LE only 15# in w/c straight arm pulls 15# rows 10# chest press 20# biceps curls Leg press 40# 2x15 AT Mat table   4lb OHP   Crunches   Horiz shoulder Abd green 2x10  Seated march no UE support  06/01/23 At Mat table 5XSTS to The back of WC: 64 seconds really has to use his hands and is unsteady with standing Cannot stand on his own without holding onto something due to not having a L quad tendon MMT RUE flex 4/5 abd 4-/5, LUE Flex 4/5, Abd 4/5 Seated military press 5lb 2x10 Seated rows blue 2x15 Horiz abd green 2x15 HS curls green x15, blue x 15  Seated crunches 2x15 Nustep level 6 10 minutes 3 minutes with LE's only  05/29/23 Bike level 5 x 6 minutes 15# in w/c straight arm pulls 15# rows 10# chest press 20# biceps curls 10# overhead press 55# leg curls both 2x10, right only 25# 2x10 Leg extension left only 10# 2x10 LEg press 40# 2x10, then single legs 2x10, right leg was eccentrics Pball in lap isometric abs Nustep level 6 LE only 2x10   PATIENT EDUCATION:  Education details: POC Person educated:  Patient Education method: Explanation Education comprehension: verbalized understanding  HOME EXERCISE PROGRAM:  ASSESSMENT:  CLINICAL IMPRESSION: Pt had a "slip" this past week denies injury.  I mixed things up today with some supine exercises and some standing activities in the Pbars, for this he needs PT to block the right knee due to not having patellar tendon on the right.  We are trying to help him stay living at home and be as independent as possible, trying to prevent mm atrophy that will further his decline   Patient is a 83 y.o. male who was seen today for physical therapy evaluation and treatment for repeated falls and balance issues, he has had 3 quadriceps tendon ruptures in the past year, his last rupture was not repaired so he has no active right knee extension.  He reports repeated  falls, falling every few days. Pt I very impulsive and unsafe with transfers, he is no longer walking and is w/c dependent.   He will benefit from skilled PT to decrease his risk for falls and address his overall strength, function and safety   OBJECTIVE IMPAIRMENTS: Abnormal gait, cardiopulmonary status limiting activity, decreased activity tolerance, decreased balance, decreased coordination, decreased endurance, decreased mobility, difficulty walking, decreased ROM, decreased strength, improper body mechanics, postural dysfunction, and poor safety .   REHAB POTENTIAL: Fair dependent on carry over and patient compliance  CLINICAL DECISION MAKING: Evolving/moderate complexity  EVALUATION COMPLEXITY: Moderate  GOALS: Goals reviewed with patient? Yes  SHORT TERM GOALS: Target date: 05/29/23  Patient will be independent with initial HEP. Goal status: Met  05/10/23  LONG TERM GOALS: Target date: 07/29/23  Patient will be independent with advanced/ongoing HEP to improve outcomes and carryover.  Goal status: INITIAL  2.  Understand the need for safety with his transfers and all the steps to be as safe as possible Goal status: Ongoing 06/01/23  3.  Patient will demonstrate improved functional LE strength as demonstrated by < 25s on 5xSTS. Baseline: 83 seconds had to use hands and was unable to be independent with the standing Goal status: Progressing 06/01/23  4. Increase UE strength to 4+/5 for better transfers and function Goal status: Progressing  06/01/23  5.  Patient will report no falls in a 4 weeks period. Baseline: falls every few days Goal status: Ongoing 06/01/23 fall last "20 or 21" of Feburary, ongoing 07/05/23  PLAN:  PT FREQUENCY: 2x/week  PT DURATION: 12 weeks  PLANNED INTERVENTIONS: Therapeutic exercises, Therapeutic activity, Neuromuscular re-education, Balance training, Gait training, Patient/Family education, Self Care, Joint mobilization, Stair training, Cryotherapy, Moist  heat, and Manual therapy.  PLAN FOR NEXT SESSION: needs total body strength to help with his safety in transfers and independence.   Jearld Lesch, PT

## 2023-07-05 NOTE — Progress Notes (Unsigned)
 PATIENT: Brandon Mendoza. DOB: 1940/11/22  REASON FOR VISIT: follow up HISTORY FROM: patient  Virtual Visit via MyChart video  I connected with Brandon Mendoza. on 07/06/23 at  8:30 AM EDT via MyChart video and verified that I am speaking with the correct person using two identifiers.   I discussed the limitations, risks, security and privacy concerns of performing an evaluation and management service by Mychart video and the availability of in person appointments. I also discussed with the patient that there may be a patient responsible charge related to this service. The patient expressed understanding and agreed to proceed.   History of Present Illness:  07/06/23 [name]. is a 83 y.o. male here today for follow up. He was last seen by Dr Vickey Huger 06/2021 and referred to Dr Terrace Arabia 11/2021 for imbalance, lower ext weakness and pain. 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 PT and follow up with PCP advised.   Since, he reports doing fairly well. Unfortunately he had a fall last year and ruptured his right quadriceps tendon. He had surgical repair that failed and returned for revision. He fell again and ruptured same tendon. He is being referred to ortho specialist for consult for reconstruction. He continues to work with outpatient PT twice weekly. Mostly wheelchair bound but able to transfer independently. He does have posterior walker that he can use occasionally.   He continues CPAP therapy nightly. Average usage about 5-6 hours. AHI well managed. He does usually sleep another 1-2 hours after waking to use restroom and does not restart therapy. He wakes feeling refreshed. Last machine set up early 2020.     12/10/2021 YY: Brandon Mendoza, is a 83 year old male, seen in request by his primary care physician Dr. Jon Billings, Alycia Rossetti for slow worsening gait abnormality, he is a patient of Dr. Vickey Huger for obstructive sleep  apnea   I reviewed and summarized the referring note. PMHX. HTN HLD Stroke COPD, quit smoke in 2006, was smoking 1ppd OSA Stroke in 2013, s/p left carotid artery endarterectomy Lumbar decompression surgery by Dr. Debbe Bales in Sept 2022, L4-5.   He used to be very active, played racquetball regularly on 05/18/2019, at that time he noticed increased low back pain, balance issues, underwent L4-5 decompression surgery by orthopedic surgeon Dr. Debbe Bales in September 2022   Surgery did help his low back pain, radiating pain to bilateral lower extremity, but he continued to have lower extremity weakness, gait abnormality, which did not change much   He now denies significant neck pain, low back pain, denies bowel and bladder incontinence, recently started physical therapy, reported some improvement   He had gradual onset bilateral hammertoes for many years, seen by foot doctor for many years, because he has noticeable hammertoes, complains of lower extremity paresthesia, he was told possibility of Charcot-Marie-Tooth disease, he denies family history of neuropathy,   EMG nerve conduction study from [facility] on September 02, 2021 by Dr. Bufford Buttner, only bilateral lower extremity motor examination was performed, evidence of bilateral peroneal, tibial motor prolonged distal latency decreased CMAP amplitude, slow conduction velocity. No EMG was performed, based on the test, Dr. 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   Personally reviewed pre surgical MRI lumbar from February 2022, L4-5 grade 1 anterolisthesis, with severe spinal stenosis moderate to severe left foraminal narrowing, facet arthropathy throughout the lumbar spine  Post L4-5 decompression MRI of lumbar spine from September 2023, posterior pedicle screw and rod fixation L4-5 without residual stenosis, progressive adjacent level disease at L3-4 with moderate  right and mild left subarticular narrowing and moderate foraminal stenosis, progressive moderate facet hypertrophy at L1-2 with progressive whiteboard disc protrusion with moderate right foraminal stenosis   MRI of the brain with without contrast November 26, 2021, generalized atrophy moderate small vessel disease no acute abnormality, remote lacunar infarction of left corona radiata, left caudate head   MRI cervical spine, mild degenerative changes no significant canal or foraminal narrowing  07/22/2021 CD: Brandon Mendoza is a 83 y.o. male patient , originally referred by Dr Pearlean Brownie 2014 after a stroke and followed in the sleep clinic for OSA on CPAP. Followed also by Shawnie Dapper, NP. He has his primary care in [facility]. He has changed his cardio care to Dr Webb Silversmith. Interval Visit and history : 07-22-2021: The patient is worried about his neuropathy, and he has undergone some UV light infrared light stimulation therapy. PT is working with him. He has right sided weakness, high arches and hammertoes, question of Charcot marie tooth.  He had nerve tests with Dr Maple Hudson.  Dr. Maple Hudson did nerve conduction and EMG testing, he also underwent spinal tap with CSF testing and nothing showed up abnormal Dr. Maple Hudson did state that the patient had neuropathy but he could not identify a cause the patient is not diabetic, he has ptosis. Was checked for myasthenia gravis- negative. He cant stand on one leg, no tandem gait ability, and he falls frequently. I have no NCV capacity in our office for now- that's what is needed.     Dr Jacinto Halim has recently seen him, he Dr. Jacinto Halim wanted me to reevaluate this patient for muscle disorder but I do not have the capacity to do these tests here.  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  He had no further episodes of atrial fibrillation on the loop recorder  therefore the anticoagulation could be discontinued I am glad of that.  The question of Charcot-Marie-Tooth syndrome is justified however I have no ability to provide a nerve conduction with EMG at this time I will ask my colleague Dr. Terrace Arabia to or Dr. Daisy Blossom to recommend where this test would best be done.     In addition the patient had just been seen on 4-24 by Dr. Jacinto Halim and he had followed with Dr. Woodfin Ganja.    I treated him in the sleep clinic for OSA and he is on CPAP and compliant.  100% compliance for the last 30 days AutoSet is used between 5 and 10 cm water pressure with 2 cm expiratory pressure relief.  Residual AHI for this patient is 1.7 which is excellent.  Set up for this machine was 03-2018.  08/18/20 ALL: Mr Danh returns for annual follow up for OSA on CPAP therapy. He is doing very well on CPAP. He is using CPAP nightly. He does continue to wake multiple times at night to urinate. He does not sleep much longer than 5-6 hours. He denies concerns with CPAP or supplies.   He is concerned about multiple falls over the past year. At last follow up with me, an irregular heart rate was noted. He had follow up with PCP and referred to cardiology for atrial fibrillation. He was started on flecainide and Eliquis. Plavix  discontinued (prior CVA in 2002). Since, he reports that with prolonged activity like walking or mowing the lawn, he has bilateral leg weakness and will often fall. He feels that his legs just give out and he collapses. He was an avid racquet ball player and walked several miles a day prior to diagnosis of afib. He has been seen by Dr Maple Hudson, neurology, who diagnosed idiopathic axonal neuropathy. He was referred to Dr Roderic Ovens for placement of Vestiflex due to significant lumbar spine disease. Pain has improved but balance difficulty remains. He has completed PT with little improvement. He does have intermittent dizziness while standing but does not feel this occurs with leg weakness  and he does not fall when dizzy. No lightheadedness, palpitations, or chest pain. He has decided to wean Xanax 0.5mg  taken daily for anxiety to see if this helps. He was offered a trial of Sinemet and/or lumbar puncture and nystagmogram. Myasthenia labs were negative. He is frustrated, today, stating that he does not have a primary care provider. Dr Julius Bowels retired last year and Dr Sharol Given is retiring this month. He request a referral to local PCP.      08/05/2019 ALL:  CASSEY HURRELL is a 83 y.o. male here today for follow up of OSA on CPAP therapy.  He reports he is doing very well.  He denies any concerns with his CPAP machine or supplies.  He is using CPAP nearly every night.  Occasionally he will miss a night if he goes hunting.  He definitely feels that CPAP has helped with improved sleep quality and increased energy levels.  He continues to play racquetball.  He was recently seen by primary care.  He did report some dizziness and feeling off balance for the past few months.  He does note that this seems to be worse when he is playing racquetball.  He is also noticed feeling off balance when walking in the woods.  He denies any chest pain, trouble breathing or palpitations.  He does not have a history of A. fib that he is aware of.  He is on Plavix status post CVA in 2013.  Compliance report dated 07/12/2019 through 08/10/2019 reveals that he used CPAP 28 of the past 30 days for compliance of 93%.  He used CPAP greater than 4 hours 28 of the past 30 days for compliance of 93%.  Average usage was 5 hours and 57 minutes.  Residual AHI was 1.6 on 5 to 10 cm of water and an EPR of 2.  There was no significant leak noted.   Observations/Objective:  Generalized: Well developed, in no acute distress  Mentation: Alert oriented to time, place, history taking. Follows all commands speech and language fluent   Assessment and Plan:  83 y.o. 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 here with    ICD-10-CM   1. OSA on CPAP  G47.33     2. Neuropathy  G62.9     3. Spinal stenosis at L4-L5 level  M48.061     4. Gait abnormality  R26.9      Mr Boehle is  doing well, overall. Compliance report reveals excellent compliance. He is eligible for a new machine. We will place orders to repeat HST. Supply orders updated. He will continue therapy nightly for at least 4 hours. He will continue close follow up with care team. Commended for continuing PT and HEP. He will follow up with me pending set up of new machine.    No orders of the defined types were placed in this encounter.   No orders of the defined types were placed in this encounter.    Follow Up Instructions:  I discussed the assessment and treatment plan with the patient. The patient was provided an opportunity to ask questions and all were answered. The patient agreed with the plan and demonstrated an understanding of the instructions.   The patient was advised to call back or seek an in-person evaluation if the symptoms worsen or if the condition fails to improve as anticipated.  I provided 30 minutes of face-to-face and non face-to-face time during this MyChart video encounter. Patient located at their place of residence. Provider is in the office.    Shawnie Dapper, NP

## 2023-07-05 NOTE — Progress Notes (Unsigned)
 Brandon Mendoza

## 2023-07-05 NOTE — Progress Notes (Signed)
 Because this visit was a virtual/telehealth visit,  certain criteria was not obtained, such a blood pressure, CBG if applicable, and timed get up and go. Any medications not marked as "taking" were not mentioned during the medication reconciliation part of the visit. Any vitals not documented were not able to be obtained due to this being a telehealth visit or patient was unable to self-report a recent blood pressure reading due to a lack of equipment at home via telehealth. Vitals that have been documented are verbally provided by the patient.   Subjective:   Brandon Kwong. is a 83 y.o. who presents for a Medicare Wellness preventive visit.  Visit Complete: Virtual I connected with  Brandon Mendoza. on 07/05/23 by a audio enabled telemedicine application and verified that I am speaking with the correct person using two identifiers.  Patient Location: Home  Provider Location: Home Office  I discussed the limitations of evaluation and management by telemedicine. The patient expressed understanding and agreed to proceed.  Vital Signs: Because this visit was a virtual/telehealth visit, some criteria may be missing or patient reported. Any vitals not documented were not able to be obtained and vitals that have been documented are patient reported.  VideoDeclined- This patient declined Librarian, academic. Therefore the visit was completed with audio only.  Persons Participating in Visit: Patient.  AWV Questionnaire: No: Patient Medicare AWV questionnaire was not completed prior to this visit.  Cardiac Risk Factors include: advanced age (>84men, >62 women);male gender;hypertension;Other (see comment);dyslipidemia, Risk factor comments: copd,afib     Objective:    Today's Vitals   07/05/23 1430  BP: 137/78  Weight: 190 lb (86.2 kg)  Height: 5\' 8"  (1.727 m)   Body mass index is 28.89 kg/m.     07/05/2023    2:40 PM 05/01/2023    8:52 AM 10/07/2022    5:52  AM 08/30/2022    5:15 PM 07/12/2022   12:44 PM 07/08/2022    4:04 PM 02/23/2022    8:08 AM  Advanced Directives  Does Patient Have a Medical Advance Directive? No No No;Yes No Yes Yes Yes  Type of Cytogeneticist of [place];Living will Healthcare Power of [place];Living will Living will;Healthcare Power of Attorney  Does patient want to make changes to medical advance directive?   Yes (Inpatient - patient defers changing a medical advance directive at this time - Information given)  No - Patient declined  No - Patient declined  Copy of Healthcare Power of Attorney in Chart?     No - copy requested    Would patient like information on creating a medical advance directive?   No - Patient declined No - Patient declined       Current Medications (verified) Outpatient Encounter Medications as of 07/05/2023  Medication Sig   aspirin EC 81 MG tablet Take 81 mg by mouth daily. Swallow whole. Four times a week (Sunday, Monday, Wednesday and Friday).   DULoxetine (CYMBALTA) 30 MG capsule Take 1 capsule (30 mg total) by mouth at bedtime.   flecainide (TAMBOCOR) 100 MG tablet Take 1 tablet (100 mg total) by mouth 2 (two) times daily. (Patient taking differently: Take 50 mg by mouth 2 (two) times daily. Takes half of a 100 mg tablet.)   flecainide (TAMBOCOR) 100 MG tablet Take 100 mg by mouth once.   losartan-hydrochlorothiazide (HYZAAR) 50-12.5 MG tablet TAKE 1 TABLET EVERY MORNING   melatonin 5  MG TABS 1 tablet in the evening Orally Once a day   rosuvastatin (CRESTOR) 10 MG tablet Take 1 tablet (10 mg total) by mouth every evening.   sertraline (ZOLOFT) 50 MG tablet TAKE AS INSTRUCTED BY YOUR PRESCRIBER   No facility-administered encounter medications on file as of 07/05/2023.    Allergies (verified) Patient has no known allergies.   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   The spinal cord appears normal. 2.   No spinal stenosis. 3.   Mild multilevel degenerative changes as detailed above that do not lead to spinal stenosis or nerve root compression. 4.   T2 hyperintense foci within the pons consistent with chronic microvascular ischemic changes.   Loop - Medtronic Linq 10/22/2020 10/22/2020   Nocturia    Paroxysmal atrial fibrillation (HCC)    Peripheral neuropathy    Peripheral vascular disease (HCC)    Sleep apnea    on 6 cm, nasal pillow   Spinal stenosis    Stroke (HCC) 05/01/2011   ischemic   Weakness generalized 10/21/2021   Severe, legs and arms   Past Surgical History:  Procedure Laterality Date   CAROTID ENDARTERECTOMY  12/09/11   Left cea   ENDARTERECTOMY  12/09/2011   Procedure: ENDARTERECTOMY CAROTID;  Surgeon: Larina Earthly, MD;  Location: [facility] OR;  Service: Vascular;  Laterality: Left;   EYE SURGERY     rt retina detachment   HAMMER TOE SURGERY  2004   HERNIA REPAIR  2006   I & D EXTREMITY Right 10/07/2022   Procedure: IRRIGATION AND DEBRIDEMENT EXTREMITY WITH WOUND CLOSURE;  Surgeon: Venita Lick, MD;  Location: [facility] ORS;  Service: Orthopedics;  Laterality: Right;   QUADRICEPS TENDON REPAIR Right 07/12/2022   Procedure: REPAIR QUADRICEP TENDON;  Surgeon: Durene Romans, MD;  Location: [facility] ORS;  Service: Orthopedics;  Laterality: Right;   QUADRICEPS  TENDON REPAIR Right 08/30/2022   Procedure: REPAIR QUADRICEP TENDON;  Surgeon: Durene Romans, MD;  Location: [facility] ORS;  Service: Orthopedics;  Laterality: Right;   SHOULDER ARTHROSCOPY W/ ROTATOR CUFF REPAIR  2010   TONSILLECTOMY     Family History  Problem Relation Age of Onset   Alzheimer's disease Father    Diabetes Other    Social History   Socioeconomic History   Marital status: Married    Spouse name: [name]   Number of children: 4   Years of education: College   Highest education level: Not on file  Occupational History   Occupation: Retired  Tobacco Use   Smoking status: Former    Current packs/day: 0.00    Average packs/day: 1 pack/day for 50.0 years (50.0 ttl pk-yrs)    Types: Cigarettes  Start date: 08/27/1954    Quit date: 08/26/2004    Years since quitting: 18.8    Passive exposure: Never   Smokeless tobacco: Never   Tobacco comments:    quit smoking 08/2004  Vaping Use   Vaping status: Never Used  Substance and Sexual Activity   Alcohol use: Yes    Alcohol/week: 7.0 standard drinks of alcohol    Types: 7 Standard drinks or equivalent per week    Comment: occ   Drug use: No   Sexual activity: Not Currently  Other Topics Concern   Not on file  Social History Narrative   Patient is married with 4 children.   Patient is right handed.   Patient has college education.   Patient drinks 3 cups daily.   Social Drivers of Corporate investment banker Strain: Low Risk  (07/05/2023)   Overall Financial Resource Strain (CARDIA)    Difficulty of Paying Living Expenses: Not hard at all  Food Insecurity: No Food Insecurity (07/05/2023)   Hunger Vital Sign    Worried About Running Out of Food in the Last Year: Never true    Ran Out of Food in the Last Year: Never true  Transportation Needs: No Transportation Needs (07/05/2023)   PRAPARE - Administrator, Civil Service (Medical): No    Lack of Transportation (Non-Medical): No  Physical Activity: Insufficiently  Active (07/05/2023)   Exercise Vital Sign    Days of Exercise per Week: 2 days    Minutes of Exercise per Session: 10 min  Stress: No Stress Concern Present (07/05/2023)   [employer] of Occupational Health - Occupational Stress Questionnaire    Feeling of Stress : Not at all  Social Connections: Moderately Isolated (07/05/2023)   Social Connection and Isolation Panel [NHANES]    Frequency of Communication with Friends and Family: More than three times a week    Frequency of Social Gatherings with Friends and Family: More than three times a week    Attends Religious Services: Never    Database administrator or Organizations: No    Attends Banker Meetings: Never    Marital Status: Married    Tobacco Counseling Counseling given: Not Answered Tobacco comments: quit smoking 08/2004    Clinical Intake:  Pre-visit preparation completed: Yes  Pain : No/denies pain     BMI - recorded: 28.89 Nutritional Status: BMI 25 -29 Overweight Nutritional Risks: None  Lab Results  Component Value Date   HGBA1C 5.5 03/09/2022   HGBA1C 5.7 (H) 11/30/2011     How often do you need to have someone help you when you read instructions, pamphlets, or other written materials from your doctor or pharmacy?: 1 - Never What is the last grade level you completed in school?: degree  Interpreter Needed?: No  Information entered by :: [employer]   Activities of Daily Living     07/05/2023    2:38 PM 10/08/2022    8:04 AM  In your present state of health, do you have any difficulty performing the following activities:  Hearing? 0   Vision? 0   Difficulty concentrating or making decisions? 0   Walking or climbing stairs? 1   Comment in a wheelchair   Dressing or bathing? 1   Comment has a shower bench   Doing errands, shopping? 0 1  Preparing Food and eating ? N   Using the Toilet? N   In the past six months, have you accidently leaked  urine? Y   Do you have problems  with loss of bowel control? N   Managing your Medications? N   Managing your Finances? N   Housekeeping or managing your Housekeeping? N     Patient Care Team: Lula Olszewski, MD as PCP - General (Internal Medicine) Dohmeier, Porfirio Mylar, MD as Consulting Physician (Neurology) Alejandro Mulling, RN as [facility] Care Management Alejandro Mulling, RN as [facility] Care Management Windle Guard, MD as Consulting Physician (Anesthesiology) Cordella Register as Physician Assistant (Orthopedic Surgery) Durene Romans, MD as Consulting Physician (Orthopedic Surgery)  Indicate any recent Medical Services you may have received from other than [facility] providers in the past year (date may be approximate).     Assessment:   This is a routine wellness examination for Brandon Mendoza.  Hearing/Vision screen Hearing Screening - Comments:: Patient declined  Vision Screening - Comments:: Patient states he wears readers   Goals Addressed             This Visit's Progress    Patient Stated       Patient would like to walk       Depression Screen     07/05/2023    2:40 PM 04/21/2023   10:43 AM 03/24/2023   10:45 AM 10/26/2022    2:58 PM 12/10/2021    3:34 PM 10/21/2021    8:50 AM  PHQ 2/9 Scores  PHQ - 2 Score 0 0 0 5 0 2  PHQ- 9 Score 3  2 18  4     Fall Risk     07/05/2023    2:39 PM 04/21/2023   10:42 AM 03/24/2023   10:45 AM 12/22/2021    9:41 AM 10/21/2021    8:44 AM  Fall Risk   Falls in the past year? 1 1 1 1 1   Number falls in past yr: 1 1 1 1 1   Injury with Fall? 1 1 1 1 1   Comment     Arm/legs  Risk for fall due to : History of fall(s);Impaired balance/gait;Orthopedic patient History of fall(s);Impaired balance/gait Impaired balance/gait Impaired balance/gait History of fall(s)  Follow up Falls prevention discussed;Falls evaluation completed Falls evaluation completed Falls evaluation completed Falls evaluation completed Falls evaluation completed     MEDICARE RISK AT HOME:  Medicare Risk at Home Any stairs in or around the home?: Yes If so, are there any without handrails?: No Home free of loose throw rugs in walkways, pet beds, electrical cords, etc?: Yes Adequate lighting in your home to reduce risk of falls?: Yes Life alert?: No Use of a cane, walker or w/c?: Yes (wheelchair) Grab bars in the bathroom?: Yes Shower chair or bench in shower?: Yes Elevated toilet seat or a handicapped toilet?: Yes  TIMED UP AND GO:  Was the test performed?  No  Cognitive Function: 6CIT completed        07/05/2023    2:35 PM 03/01/2022   10:27 AM  6CIT Screen  What Year? 0 points 0 points  What month? 0 points 0 points  What time? 0 points 0 points  Count back from 20 0 points 0 points  Months in reverse 0 points 0 points  Repeat phrase 0 points 0 points  Total Score 0 points 0 points    Immunizations Immunization History  Administered Date(s) Administered   Fluad Quad(high Dose 65+) 01/13/2021, 12/22/2021   Influenza Split 01/13/2011, 01/14/2015, 12/27/2018   Influenza, High Dose Seasonal PF 01/08/2016, 01/05/2017,  01/17/2018, 12/26/2018, 12/10/2019, 01/13/2021, 01/13/2021   Influenza, Seasonal, Injecte, Preservative Fre 12/08/2012   Influenza,inj,quad, With Preservative 12/27/2018   Influenza-Unspecified 01/13/2011, 12/03/2013, 01/14/2015, 12/26/2016, 12/26/2018, 12/10/2019, 01/27/2023   Moderna Sars-Covid-2 Vaccination 05/14/2019   PFIZER(Purple Top)SARS-COV-2 Vaccination 04/18/2019, 05/08/2019, 01/07/2020   Pfizer Covid-19 Vaccine Bivalent Booster 40yrs & up 01/13/2021   Pneumococcal Conjugate-13 04/04/2006, 10/28/2013   Pneumococcal Polysaccharide-23 01/07/2006, 06/02/2006   Td (Adult),5 Lf Tetanus Toxid, Preservative Free 03/15/2004, 06/27/2007, 07/05/2007   Tdap 03/15/2004, 06/27/2007, 07/05/2007   Zoster, Live 06/02/2006    Screening Tests Health Maintenance  Topic Date Due   DTaP/Tdap/Td (7 - Td or Tdap)  03/23/2024 (Originally 07/04/2017)   INFLUENZA VACCINE  10/27/2023   Medicare Annual Wellness (AWV)  07/04/2024   Pneumonia Vaccine 57+ Years old  Completed   HPV VACCINES  Aged Out   COVID-19 Vaccine  Discontinued   Zoster Vaccines- Shingrix  Discontinued    Health Maintenance  There are no preventive care reminders to display for this patient. Health Maintenance Items Addressed: Additional Screening:  Vision Screening: Recommended annual ophthalmology exams for early detection of glaucoma and other disorders of the eye.  Dental Screening: Recommended annual dental exams for proper oral hygiene  Community Resource Referral / Chronic Care Management: CRR required this visit?  No   CCM required this visit?  No     Plan:     I have personally reviewed and noted the following in the patient's chart:   Medical and social history Use of alcohol, tobacco or illicit drugs  Current medications and supplements including opioid prescriptions. Patient is not currently taking opioid prescriptions. Functional ability and status Nutritional status Physical activity Advanced directives List of other physicians Hospitalizations, surgeries, and ER visits in previous 12 months Vitals Screenings to include cognitive, depression, and falls Referrals and appointments  In addition, I have reviewed and discussed with patient certain preventive protocols, quality metrics, and best practice recommendations. A written personalized care plan for preventive services as well as general preventive health recommendations were provided to patient.     Rudi Heap, [place]   07/05/2023   After Visit Summary: (MyChart) Due to this being a telephonic visit, the after visit summary with patients personalized plan was offered to patient via MyChart   Notes: Nothing significant to report at this time.

## 2023-07-05 NOTE — Patient Instructions (Signed)
 Below is our plan:  We will continue to monitor neuropathy symptoms. Try reducing alcohol intake.   Please continue using your CPAP regularly. While your insurance requires that you use CPAP at least 4 hours each night on 70% of the nights, I recommend, that you not skip any nights and use it throughout the night if you can. Getting used to CPAP and staying with the treatment long term does take time and patience and discipline. Untreated obstructive sleep apnea when it is moderate to severe can have an adverse impact on cardiovascular health and raise her risk for heart disease, arrhythmias, hypertension, congestive heart failure, stroke and diabetes. Untreated obstructive sleep apnea causes sleep disruption, nonrestorative sleep, and sleep deprivation. This can have an impact on your day to day functioning and cause daytime sleepiness and impairment of cognitive function, memory loss, mood disturbance, and problems focussing. Using CPAP regularly can improve these symptoms.  We will update supply orders, today.   Please make sure you are staying well hydrated. I recommend 50-60 ounces daily. Well balanced diet and regular exercise encouraged. Consistent sleep schedule with 6-8 hours recommended.   Please continue follow up with care team as directed.   Follow up with me in 1 year   You may receive a survey regarding today's visit. I encourage you to leave honest feed back as I do use this information to improve patient care. Thank you for seeing me today!

## 2023-07-05 NOTE — Patient Instructions (Signed)
 [name] , Thank you for taking time to come for your Medicare Wellness Visit. I appreciate your ongoing commitment to your health goals. Please review the following plan we discussed and let me know if I can assist you in the future.   Referrals/Orders/Follow-Ups/Clinician Recommendations: follow up next year for Annual wellness visit  This is a list of the screening recommended for you and due dates:  Health Maintenance  Topic Date Due   DTaP/Tdap/Td vaccine (7 - Td or Tdap) 03/23/2024*   Flu Shot  10/27/2023   Medicare Annual Wellness Visit  07/04/2024   Pneumonia Vaccine  Completed   HPV Vaccine  Aged Out   COVID-19 Vaccine  Discontinued   Zoster (Shingles) Vaccine  Discontinued  *Topic was postponed. The date shown is not the original due date.    Advanced directives: (Declined) Advance directive discussed with you today. Even though you declined this today, please call our office should you change your mind, and we can give you the proper paperwork for you to fill out.  Next Medicare Annual Wellness Visit scheduled for next year: Yes

## 2023-07-06 ENCOUNTER — Encounter: Payer: Self-pay | Admitting: Family Medicine

## 2023-07-06 ENCOUNTER — Telehealth (INDEPENDENT_AMBULATORY_CARE_PROVIDER_SITE_OTHER): Admitting: Family Medicine

## 2023-07-06 ENCOUNTER — Ambulatory Visit: Payer: Medicare Other

## 2023-07-06 DIAGNOSIS — M48061 Spinal stenosis, lumbar region without neurogenic claudication: Secondary | ICD-10-CM

## 2023-07-06 DIAGNOSIS — R269 Unspecified abnormalities of gait and mobility: Secondary | ICD-10-CM | POA: Diagnosis not present

## 2023-07-06 DIAGNOSIS — G629 Polyneuropathy, unspecified: Secondary | ICD-10-CM | POA: Diagnosis not present

## 2023-07-06 DIAGNOSIS — G4733 Obstructive sleep apnea (adult) (pediatric): Secondary | ICD-10-CM

## 2023-07-19 ENCOUNTER — Encounter: Payer: Self-pay | Admitting: Physical Therapy

## 2023-07-19 ENCOUNTER — Ambulatory Visit: Admitting: Physical Therapy

## 2023-07-19 DIAGNOSIS — R2689 Other abnormalities of gait and mobility: Secondary | ICD-10-CM

## 2023-07-19 DIAGNOSIS — M6281 Muscle weakness (generalized): Secondary | ICD-10-CM

## 2023-07-19 DIAGNOSIS — R296 Repeated falls: Secondary | ICD-10-CM

## 2023-07-19 DIAGNOSIS — M25561 Pain in right knee: Secondary | ICD-10-CM

## 2023-07-19 NOTE — Therapy (Signed)
 OUTPATIENT PHYSICAL THERAPY TREATMENT Progress Note Reporting Period 06/07/23 to 07/19/23 for visits 11-20  See note below for Objective Data and Assessment of Progress/Goals.        Patient Name: Brandon Mendoza. MRN: [id number] DOB:May 22, 1940, 83 y.o., male Today's Date: 07/19/2023  END OF SESSION:  PT End of Session - 07/19/23 0844     Visit Number 20    Date for PT Re-Evaluation 07/29/23    Authorization Type Medicare    PT Start Time 0842    PT Stop Time 0928    PT Time Calculation (min) 46 min    Activity Tolerance Patient tolerated treatment well    Behavior During Therapy [facility] for tasks assessed/performed;Impulsive             Past Medical History:  Diagnosis Date   Acquired clawfoot, right foot 12/16/2021   Anxiety    Atrophy of calf muscles 10/21/2021   Duplex calves was negative for significant blockage EMG was done at [facility] MRI lumbar spine was also done at [facility]   Carotid artery occlusion    CKD (chronic kidney disease)    Stage 3   COPD (chronic obstructive pulmonary disease) (HCC)    Depression    Dysrhythmia    PAF, atrial tachycardia   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   The spinal cord appears normal. 2.   No spinal stenosis. 3.   Mild multilevel degenerative changes as detailed above that do not lead to spinal stenosis or nerve root compression. 4.   T2 hyperintense foci within the pons consistent with chronic microvascular ischemic changes.   Loop - Medtronic Linq 10/22/2020 10/22/2020   Nocturia    Paroxysmal atrial fibrillation (HCC)    Peripheral neuropathy    Peripheral vascular disease (HCC)    Sleep apnea    on 6 cm, nasal pillow   Spinal stenosis    Stroke (HCC) 05/01/2011   ischemic   Weakness  generalized 10/21/2021   Severe, legs and arms   Past Surgical History:  Procedure Laterality Date   CAROTID ENDARTERECTOMY  12/09/11   Left cea   ENDARTERECTOMY  12/09/2011   Procedure: ENDARTERECTOMY CAROTID;  Surgeon: Mayo Speck, MD;  Location: [facility] OR;  Service: Vascular;  Laterality: Left;   EYE SURGERY     rt retina detachment   HAMMER TOE SURGERY  2004   HERNIA REPAIR  2006   I & D EXTREMITY Right 10/07/2022   Procedure: IRRIGATION AND DEBRIDEMENT EXTREMITY WITH WOUND CLOSURE;  Surgeon: Mort Ards, MD;  Location: [facility] ORS;  Service: Orthopedics;  Laterality: Right;   QUADRICEPS TENDON REPAIR Right 07/12/2022   Procedure: REPAIR QUADRICEP TENDON;  Surgeon: Claiborne Crew, MD;  Location: [facility] ORS;  Service: Orthopedics;  Laterality: Right;   QUADRICEPS TENDON REPAIR Right 08/30/2022   Procedure: REPAIR QUADRICEP TENDON;  Surgeon: Claiborne Crew, MD;  Location: [facility] ORS;  Service: Orthopedics;  Laterality: Right;   SHOULDER ARTHROSCOPY W/ ROTATOR CUFF REPAIR  2010   TONSILLECTOMY     Patient Active Problem List   Diagnosis Date Noted   Coronary artery calcification 05/09/2023   Rupture of quadriceps tendon 11/03/2022   Wound dehiscence 10/07/2022   Quadriceps tendon  rupture, right, subsequent encounter 08/30/2022   Quadriceps tendon rupture, right, initial encounter 07/12/2022   Unable to care for self 07/08/2022   Dehydration 07/08/2022   Depression 07/08/2022   COPD (chronic obstructive pulmonary disease) (HCC) 07/08/2022   Sialoadenitis of submandibular gland 04/28/2022   Macular degeneration 03/01/2022   Myofascial pain 02/10/2022   Lumbar spondylosis 01/03/2022   Acquired clawfoot, left foot 12/16/2021   Acquired clawfoot, right foot 12/16/2021   Claw foot 12/14/2021   Gait abnormality 12/10/2021   Spinal stenosis at L4-L5 level 12/10/2021   Ataxia 11/22/2021   Lightheadedness 11/02/2021   Claudication of lower extremity (HCC) 10/21/2021   Atrophy of calf muscles  10/21/2021   Weakness generalized 10/21/2021   Focal neurological deficit 10/21/2021   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   Pain in joint of right hip 09/14/2021   High arches 07/22/2021   Hammertoes of both feet 07/22/2021   Neuropathy 07/22/2021   Constipation 05/28/2021   Disequilibrium 05/28/2021   History of colonic polyps 05/28/2021   Renal cyst 05/28/2021   Gastroesophageal reflux disease without esophagitis 05/05/2021   Ingrowing left great toenail 02/15/2021   S/P lumbar fusion 12/24/2020   Spinal stenosis of lumbar region 12/04/2020   Acquired complex renal cyst 11/23/2020   Functional gait abnormality 11/04/2020   Idiopathic neuropathy 11/02/2020   Tinea pedis of both feet 10/28/2020   Loop - Medtronic Linq 10/22/2020 10/22/2020   Benign hypertension with 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) 10/05/2020   Fall with injury 10/05/2020   Hematoma 09/16/2020   Normocytic anemia 09/14/2020   Leukocytosis 09/14/2020   AF (paroxysmal atrial fibrillation) (HCC) 09/14/2020   Chronic anticoagulation 09/14/2020   Hyponatremia 09/14/2020   Pain in left foot 01/22/2020   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   Pain in joint of right knee 12/27/2019   Recurrent falls 11/17/2019   Paroxysmal atrial fibrillation (HCC) 10/22/2019   Pain in right foot 10/14/2019   Falls 10/07/2019   Ataxia involving legs 08/12/2019   OSA on CPAP 06/20/2018   Urge incontinence 04/13/2018   Urinary urgency 04/13/2018   Pain in left knee 03/01/2018   Other intervertebral disc degeneration, lumbar region 06/16/2017   Acquired bilateral hammer toes 10/05/2015   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   Generalized anxiety disorder 05/12/2015   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   History of left-sided carotid endarterectomy 04/10/2014   Essential  hypertension 04/10/2014   Occlusion and stenosis of carotid artery without mention of cerebral infarction 12/15/2011   Habitual alcohol use 12/01/2011   Carotid artery stenosis, symptomatic 11/30/2011   Cerebral infarction (HCC) 11/29/2011   Mixed hyperlipidemia     PCP: Scherrie Curt  REFERRING PROVIDER: Scherrie Curt  REFERRING DIAG: weakness, multiple falls  Rationale for Evaluation and Treatment: Rehabilitation  THERAPY DIAG:  Other abnormalities of gait and mobility  Muscle weakness (generalized)  Repeated falls  Acute pain of right knee  ONSET DATE: 10/07/22  SUBJECTIVE:  SUBJECTIVE STATEMENT: Reports that he was not able to get an appointment last week, reports that he has not had any falls, he did get some new shoes  PERTINENT HISTORY:  Cerebral infarct 10/21/21, lumbar fusion, recurrent falls, has significant neuropathy and this is the reason for most of his falls   PAIN:  Are you having pain? Yes: NPRS scale: 0/10 Pain location: right knee Pain description: ache  Aggravating factors: being on my feet  Relieving factors: movement  PRECAUTIONS: Fall  WEIGHT BEARING RESTRICTIONS: No  FALLS:  Has patient fallen in last 6 months? Yes. Number of falls every few days   LIVING ENVIRONMENT: Lives with: lives with their spouse Lives in: House/apartment Stairs: Yes: Internal: 20 steps; on right going up Has following equipment at home: Otho Blitz - 4 wheeled, Wheelchair (manual), shower chair, Tour manager, Grab bars, Ramped entry, and trekking pole  OCCUPATION: Retired  PLOF: Independent, prior to a year ago he was driving used trekking poles to walk  PATIENT GOALS: live at home  NEXT MD VISIT:   OBJECTIVE:   DIAGNOSTIC FINDINGS:    LUMBAR SPINE FINDINGS: 1. Wide  laminectomy and posterior pedicle screw and rod fixation at L4-5 without residual or recurrent stenosis. 2. Progressive adjacent level disease at L3-4 with moderate right and mild left subarticular narrowing and moderate foraminal stenosis bilaterally. Progression is predominantly due to facet disease 3. Progressive moderate facet hypertrophy at L1-2 with a progressive rightward disc protrusion resulting in progressive moderate right foraminal stenosis. Asymmetric endplate marrow edema on the right at L1-2 is consistent with progressive disease as well. 4. Otherwise stable degenerative change without other focal stenosis.   THORACIC SPINE FINDINGS:  On sagittal views the vertebral bodies have normal height and alignment.  Hemangioma within T9 vertebral body and slightly within T10 vertebral body.  Anterior kyphosis.  Mild scoliosis convex right centered at T8 level. The spinal cord is normal in size and appearance. The paraspinal soft tissues are unremarkable.     On axial views there is no spinal stenosis or foraminal narrowing.  Limited views of the aorta, kidneys, liver, lungs and paraspinal muscles are notable for multiple large right renal cysts ranging in size from 1.5 to 7 cm.   COGNITION: Overall cognitive status: Within functional limits for tasks assessed     SENSATION: WFL  MUSCLE LENGTH: Hamstrings: mod tightness on both sides  POSTURE: rounded shoulders and forward head  LOWER EXTREMITY ROM:   the right knee has HS, has no quad tendon on the right, unable to lift the leg, left leg WFL's , left hip and right hip and ankles WFL's   LOWER EXTREMITY MMT: 4-/5 for the LE's except for the right knee extension UPPER EXTREMITY MMT:  4-/5 for shoulders and elbows   FUNCTIONAL TESTS:  5XSTS: 83 seconds really has to use his hands and is unsteady with standing Cannot stand on his own without holding onto something due to poor balance   Transfers:  needs set up and MinA for  safety, is impulsive and unsafe at times. GAIT: He is w/c dependent    TODAY'S TREATMENT:  DATE:  07/19/23 Nustep level 6 x 7 minutes Seated in w/c 15# rows, straight arm pulls, 15# biceps 2x15 10# each pulley chest press 20# single leg curls 2x15 Supine feet on ball bridge, rotation, isometric abs Black tband clamshells Ball b/n knees squeeze with bridge Black tband hip extension  07/05/23 Nustep level 6 x 7 minutes, level 5 LE only x 5 minutes Biceps 15#  Rows 15# Extension 15# Feet on ball trunk rotation, bridges, isometric abs Black tband clamshells in [employer] squeeze with bridge In Pbars high knee raises PT blocking the right knee  07/03/23 NuStep L6 x7 min, x  L5 4 min LE only Shoulder Ext in w/c 15lb  10# biceps curls 15# rows  Chest press 10 2x15 Fitter press 2 blue 1 black 2x15 Sit to stand for elevated surface to w/c HS curls blue 2x15   06/29/23 NuStep L6 x23 min, x 7 min LE only  15# in w/c straight arm pulls 15# biceps curls  06/27/23 NuStep L6 x6 min, x 4 min LE only  15# in w/c straight arm pulls 15# rows 15# biceps curls 10# chest press Overhead Triceps  ext 2x15 Chest flys 10lb 2x15 WC dips x5, x5, x10 Hs curls blue 2x10  05/2823 UBE L1 x 2 min each  NuStep L6 x4 min, x LE only  Seated at mat without Back support LAQ 5lb 2x15 Seated march 5lb 2x15   UE MMT grosley 4+/5  Blue ball  OHP 2x10  Seated trunk rotations blue ball 2x15  HS curls blue 2x15   06/20/23 NuStep L 5 x 7 min, x4 min LE only RLE hip abd and flex in // bars 2x10 15# in w/c straight arm pulls 15# rows 15# biceps curls 10# chest press At Mat table  Seated trunk rotations blue ball 2x15  Blue ball  OHP 2x10  Horiz abd blue 2x15  06/12/23 NuStep L 5 x 7 min, x3 min LE only At Mat table   STM w/ thea gun to L lateral thigh  Blue ball   OHP 2x15  HS Curl green LLE x10, RLE 2x10  Horiz abd blue 2x10  LAQ LLE 5lb  2x15  Seated trunk rotations blue ball 2x15   06/09/23 NuStep L 5 x 7 min, x4 min LE only 15# in w/c straight arm pulls 15# rows 10# chest press At Mat table   4lb OHP   HS Curle green 2x10  LAQ 5lb 2x10  Seated trunk rotations blue ball 3x10  Horiz shoulder Abd blue 2x10  06/07/23 NuStep L 5 x 7 min, x4 min LE only 15# in w/c straight arm pulls 15# rows 10# chest press 20# biceps curls Leg press 40# 2x15 AT Mat table   4lb OHP   Crunches   Horiz shoulder Abd green 2x10  Seated march no UE support  06/01/23 At Mat table 5XSTS to The back of WC: 64 seconds really has to use his hands and is unsteady with standing Cannot stand on his own without holding onto something due to not having a L quad tendon MMT RUE flex 4/5 abd 4-/5, LUE Flex 4/5, Abd 4/5 Seated military press 5lb 2x10 Seated rows blue 2x15 Horiz abd green 2x15 HS curls green x15, blue x 15  Seated crunches 2x15 Nustep level 6 10 minutes 3 minutes with LE's only  05/29/23 Bike level 5 x 6 minutes 15# in w/c straight arm pulls 15# rows 10# chest press 20# biceps curls 10#  overhead press 55# leg curls both 2x10, right only 25# 2x10 Leg extension left only 10# 2x10 LEg press 40# 2x10, then single legs 2x10, right leg was eccentrics Pball in lap isometric abs Nustep level 6 LE only 2x10   PATIENT EDUCATION:  Education details: POC Person educated: Patient Education method: Explanation Education comprehension: verbalized understanding  HOME EXERCISE PROGRAM:  ASSESSMENT:  CLINICAL IMPRESSION: Patient was unable to get in last week due to full schedules, he denies any falls or other problems.  I continue to work with him on strength globally, He has no quad on the right and needs strength all over to assure is ability to remain independent and decrease the stress on his wife.  He is very impulsive and unsafe at times, like  today during a transfer he would not get his left leg down on the ground before the transfer, this was setting him up to fall as the right leg will not hold him.   Patient is a 83 y.o. male who was seen today for physical therapy evaluation and treatment for repeated falls and balance issues, he has had 3 quadriceps tendon ruptures in the past year, his last rupture was not repaired so he has no active right knee extension.  He reports repeated falls, falling every few days. Pt I very impulsive and unsafe with transfers, he is no longer walking and is w/c dependent.   He will benefit from skilled PT to decrease his risk for falls and address his overall strength, function and safety   OBJECTIVE IMPAIRMENTS: Abnormal gait, cardiopulmonary status limiting activity, decreased activity tolerance, decreased balance, decreased coordination, decreased endurance, decreased mobility, difficulty walking, decreased ROM, decreased strength, improper body mechanics, postural dysfunction, and poor safety .   REHAB POTENTIAL: Fair dependent on carry over and patient compliance  CLINICAL DECISION MAKING: Evolving/moderate complexity  EVALUATION COMPLEXITY: Moderate  GOALS: Goals reviewed with patient? Yes  SHORT TERM GOALS: Target date: 05/29/23  Patient will be independent with initial HEP. Goal status: Met  05/10/23  LONG TERM GOALS: Target date: 07/29/23  Patient will be independent with advanced/ongoing HEP to improve outcomes and carryover.  Goal status: INITIAL  2.  Understand the need for safety with his transfers and all the steps to be as safe as possible Goal status: Ongoing 06/01/23  3.  Patient will demonstrate improved functional LE strength as demonstrated by < 25s on 5xSTS. Baseline: 83 seconds had to use hands and was unable to be independent with the standing Goal status: Progressing 06/01/23  4. Increase UE strength to 4+/5 for better transfers and function Goal status: Progressing   06/01/23  5.  Patient will report no falls in a 4 weeks period. Baseline: falls every few days Goal status: Ongoing 06/01/23 fall last "20 or 21" of Feburary, ongoing 07/05/23  PLAN:  PT FREQUENCY: 2x/week  PT DURATION: 12 weeks  PLANNED INTERVENTIONS: Therapeutic exercises, Therapeutic activity, Neuromuscular re-education, Balance training, Gait training, Patient/Family education, Self Care, Joint mobilization, Stair training, Cryotherapy, Moist heat, and Manual therapy.  PLAN FOR NEXT SESSION: needs total body strength to help with his safety in transfers and independence.   Cecile Guevara W, PT

## 2023-07-21 ENCOUNTER — Encounter: Payer: Self-pay | Admitting: Physical Therapy

## 2023-07-21 ENCOUNTER — Ambulatory Visit: Admitting: Physical Therapy

## 2023-07-21 DIAGNOSIS — M6249 Contracture of muscle, multiple sites: Secondary | ICD-10-CM

## 2023-07-21 DIAGNOSIS — R296 Repeated falls: Secondary | ICD-10-CM

## 2023-07-21 DIAGNOSIS — M25561 Pain in right knee: Secondary | ICD-10-CM

## 2023-07-21 DIAGNOSIS — R2689 Other abnormalities of gait and mobility: Secondary | ICD-10-CM

## 2023-07-21 DIAGNOSIS — M6281 Muscle weakness (generalized): Secondary | ICD-10-CM

## 2023-07-21 NOTE — Therapy (Signed)
 OUTPATIENT PHYSICAL THERAPY TREATMENT      Patient Name: Brandon Mendoza. MRN: [id number] DOB:Jan 05, 1941, 83 y.o., male Today's Date: 07/21/2023  END OF SESSION:  PT End of Session - 07/21/23 0803     Visit Number 21    Date for PT Re-Evaluation 07/29/23    PT Start Time 0800    PT Stop Time 0845    PT Time Calculation (min) 45 min    Activity Tolerance Patient tolerated treatment well    Behavior During Therapy [facility] for tasks assessed/performed;Impulsive             Past Medical History:  Diagnosis Date   Acquired clawfoot, right foot 12/16/2021   Anxiety    Atrophy of calf muscles 10/21/2021   Duplex calves was negative for significant blockage EMG was done at [facility] MRI lumbar spine was also done at [facility]   Carotid artery occlusion    CKD (chronic kidney disease)    Stage 3   COPD (chronic obstructive pulmonary disease) (HCC)    Depression    Dysrhythmia    PAF, atrial tachycardia   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   The spinal cord appears normal. 2.   No spinal stenosis. 3.   Mild multilevel degenerative changes as detailed above that do not lead to spinal stenosis or nerve root compression. 4.   T2 hyperintense foci within the pons consistent with chronic microvascular ischemic changes.   Loop - Medtronic Linq 10/22/2020 10/22/2020   Nocturia    Paroxysmal atrial fibrillation (HCC)    Peripheral neuropathy    Peripheral vascular disease (HCC)    Sleep apnea    on 6 cm, nasal pillow   Spinal stenosis    Stroke (HCC) 05/01/2011   ischemic   Weakness generalized 10/21/2021   Severe, legs and arms   Past Surgical History:  Procedure Laterality Date   CAROTID ENDARTERECTOMY  12/09/11   Left cea   ENDARTERECTOMY   12/09/2011   Procedure: ENDARTERECTOMY CAROTID;  Surgeon: Mayo Speck, MD;  Location: [facility] OR;  Service: Vascular;  Laterality: Left;   EYE SURGERY     rt retina detachment   HAMMER TOE SURGERY  2004   HERNIA REPAIR  2006   I & D EXTREMITY Right 10/07/2022   Procedure: IRRIGATION AND DEBRIDEMENT EXTREMITY WITH WOUND CLOSURE;  Surgeon: Mort Ards, MD;  Location: [facility];  Service: Orthopedics;  Laterality: Right;   QUADRICEPS TENDON REPAIR Right 07/12/2022   Procedure: REPAIR QUADRICEP TENDON;  Surgeon: Claiborne Crew, MD;  Location: [facility];  Service: Orthopedics;  Laterality: Right;   QUADRICEPS TENDON REPAIR Right 08/30/2022   Procedure: REPAIR QUADRICEP TENDON;  Surgeon: Claiborne Crew, MD;  Location: [facility];  Service: Orthopedics;  Laterality: Right;   SHOULDER ARTHROSCOPY W/ ROTATOR CUFF REPAIR  2010   TONSILLECTOMY     Patient Active Problem List   Diagnosis Date Noted   Coronary artery calcification 05/09/2023   Rupture of quadriceps tendon 11/03/2022   Wound dehiscence 10/07/2022   Quadriceps tendon rupture, right, subsequent encounter 08/30/2022   Quadriceps tendon rupture, right, initial encounter 07/12/2022   Unable to care for self 07/08/2022   Dehydration 07/08/2022   Depression  07/08/2022   COPD (chronic obstructive pulmonary disease) (HCC) 07/08/2022   Sialoadenitis of submandibular gland 04/28/2022   Macular degeneration 03/01/2022   Myofascial pain 02/10/2022   Lumbar spondylosis 01/03/2022   Acquired clawfoot, left foot 12/16/2021   Acquired clawfoot, right foot 12/16/2021   Claw foot 12/14/2021   Gait abnormality 12/10/2021   Spinal stenosis at L4-L5 level 12/10/2021   Ataxia 11/22/2021   Lightheadedness 11/02/2021   Claudication of lower extremity (HCC) 10/21/2021   Atrophy of calf muscles 10/21/2021   Weakness generalized 10/21/2021   Focal neurological deficit 10/21/2021   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   Pain in joint of  right hip 09/14/2021   High arches 07/22/2021   Hammertoes of both feet 07/22/2021   Neuropathy 07/22/2021   Constipation 05/28/2021   Disequilibrium 05/28/2021   History of colonic polyps 05/28/2021   Renal cyst 05/28/2021   Gastroesophageal reflux disease without esophagitis 05/05/2021   Ingrowing left great toenail 02/15/2021   S/P lumbar fusion 12/24/2020   Spinal stenosis of lumbar region 12/04/2020   Acquired complex renal cyst 11/23/2020   Functional gait abnormality 11/04/2020   Idiopathic neuropathy 11/02/2020   Tinea pedis of both feet 10/28/2020   Loop - Medtronic Linq 10/22/2020 10/22/2020   Benign hypertension with 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) 10/05/2020   Fall with injury 10/05/2020   Hematoma 09/16/2020   Normocytic anemia 09/14/2020   Leukocytosis 09/14/2020   AF (paroxysmal atrial fibrillation) (HCC) 09/14/2020   Chronic anticoagulation 09/14/2020   Hyponatremia 09/14/2020   Pain in left foot 01/22/2020   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   Pain in joint of right knee 12/27/2019   Recurrent falls 11/17/2019   Paroxysmal atrial fibrillation (HCC) 10/22/2019   Pain in right foot 10/14/2019   Falls 10/07/2019   Ataxia involving legs 08/12/2019   OSA on CPAP 06/20/2018   Urge incontinence 04/13/2018   Urinary urgency 04/13/2018   Pain in left knee 03/01/2018   Other intervertebral disc degeneration, lumbar region 06/16/2017   Acquired bilateral hammer toes 10/05/2015   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   Generalized anxiety disorder 05/12/2015   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   History of left-sided carotid endarterectomy 04/10/2014   Essential hypertension 04/10/2014   Occlusion and stenosis of carotid artery without mention of cerebral infarction 12/15/2011   Habitual alcohol use 12/01/2011   Carotid artery stenosis,  symptomatic 11/30/2011   Cerebral infarction (HCC) 11/29/2011   Mixed hyperlipidemia     PCP: Scherrie Curt  REFERRING PROVIDER: Scherrie Curt  REFERRING DIAG: weakness, multiple falls  Rationale for Evaluation and Treatment: Rehabilitation  THERAPY DIAG:  Other abnormalities of gait and mobility  Repeated falls  Acute pain of right knee  Muscle weakness (generalized)  Contracture of muscle, multiple sites  ONSET DATE: 10/07/22  SUBJECTIVE:  SUBJECTIVE STATEMENT: "Doing all right"   PERTINENT HISTORY:  Cerebral infarct 10/21/21, lumbar fusion, recurrent falls, has significant neuropathy and this is the reason for most of his falls   PAIN:  Are you having pain? Yes: NPRS scale: 0/10 Pain location: right knee Pain description: ache  Aggravating factors: being on my feet  Relieving factors: movement  PRECAUTIONS: Fall  WEIGHT BEARING RESTRICTIONS: No  FALLS:  Has patient fallen in last 6 months? Yes. Number of falls every few days   LIVING ENVIRONMENT: Lives with: lives with their spouse Lives in: House/apartment Stairs: Yes: Internal: 20 steps; on right going up Has following equipment at home: Otho Blitz - 4 wheeled, Wheelchair (manual), shower chair, Tour manager, Grab bars, Ramped entry, and trekking pole  OCCUPATION: Retired  PLOF: Independent, prior to a year ago he was driving used trekking poles to walk  PATIENT GOALS: live at home  NEXT MD VISIT:   OBJECTIVE:   DIAGNOSTIC FINDINGS:    LUMBAR SPINE FINDINGS: 1. Wide laminectomy and posterior pedicle screw and rod fixation at L4-5 without residual or recurrent stenosis. 2. Progressive adjacent level disease at L3-4 with moderate right and mild left subarticular narrowing and moderate foraminal stenosis bilaterally.  Progression is predominantly due to facet disease 3. Progressive moderate facet hypertrophy at L1-2 with a progressive rightward disc protrusion resulting in progressive moderate right foraminal stenosis. Asymmetric endplate marrow edema on the right at L1-2 is consistent with progressive disease as well. 4. Otherwise stable degenerative change without other focal stenosis.   THORACIC SPINE FINDINGS:  On sagittal views the vertebral bodies have normal height and alignment.  Hemangioma within T9 vertebral body and slightly within T10 vertebral body.  Anterior kyphosis.  Mild scoliosis convex right centered at T8 level. The spinal cord is normal in size and appearance. The paraspinal soft tissues are unremarkable.     On axial views there is no spinal stenosis or foraminal narrowing.  Limited views of the aorta, kidneys, liver, lungs and paraspinal muscles are notable for multiple large right renal cysts ranging in size from 1.5 to 7 cm.   COGNITION: Overall cognitive status: Within functional limits for tasks assessed     SENSATION: WFL  MUSCLE LENGTH: Hamstrings: mod tightness on both sides  POSTURE: rounded shoulders and forward head  LOWER EXTREMITY ROM:   the right knee has HS, has no quad tendon on the right, unable to lift the leg, left leg WFL's , left hip and right hip and ankles WFL's   LOWER EXTREMITY MMT: 4-/5 for the LE's except for the right knee extension UPPER EXTREMITY MMT:  4-/5 for shoulders and elbows   FUNCTIONAL TESTS:  5XSTS: 83 seconds really has to use his hands and is unsteady with standing Cannot stand on his own without holding onto something due to poor balance   Transfers:  needs set up and MinA for safety, is impulsive and unsafe at times. GAIT: He is w/c dependent    TODAY'S TREATMENT:  DATE:  07/21/23 Nustep level 6 x 8  minutes, LE only L5 x 3 min Seated in w/c 15# rows, straight arm pulls 15lb  biceps 10lb 2x15 10# each pulley chest press At Mat table  Seated Crunches    W/ OHP yellow wball  Seated trunk rotatons bluw ba//  Horiz abd blue 2x10  HS curls blue 2x15  07/19/23 Nustep level 6 x 7 minutes Seated in w/c 15# rows, straight arm pulls, 15# biceps 2x15 10# each pulley chest press 20# single leg curls 2x15 Supine feet on ball bridge, rotation, isometric abs Black tband clamshells Ball b/n knees squeeze with bridge Black tband hip extension  07/05/23 Nustep level 6 x 7 minutes, level 5 LE only x 5 minutes Biceps 15#  Rows 15# Extension 15# Feet on ball trunk rotation, bridges, isometric abs Black tband clamshells in [employer] squeeze with bridge In Pbars high knee raises PT blocking the right knee  07/03/23 NuStep L6 x7 min, x  L5 4 min LE only Shoulder Ext in w/c 15lb  10# biceps curls 15# rows  Chest press 10 2x15 Fitter press 2 blue 1 black 2x15 Sit to stand for elevated surface to w/c HS curls blue 2x15   06/29/23 NuStep L6 x23 min, x 7 min LE only  15# in w/c straight arm pulls 15# biceps curls  06/27/23 NuStep L6 x6 min, x 4 min LE only  15# in w/c straight arm pulls 15# rows 15# biceps curls 10# chest press Overhead Triceps  ext 2x15 Chest flys 10lb 2x15 WC dips x5, x5, x10 Hs curls blue 2x10  05/2823 UBE L1 x 2 min each  NuStep L6 x4 min, x LE only  Seated at mat without Back support LAQ 5lb 2x15 Seated march 5lb 2x15   UE MMT grosley 4+/5  Blue ball  OHP 2x10  Seated trunk rotations blue ball 2x15  HS curls blue 2x15   06/20/23 NuStep L 5 x 7 min, x4 min LE only RLE hip abd and flex in // bars 2x10 15# in w/c straight arm pulls 15# rows 15# biceps curls 10# chest press At Mat table  Seated trunk rotations blue ball 2x15  Blue ball  OHP 2x10  Horiz abd blue 2x15  06/12/23 NuStep L 5 x 7 min, x3 min LE only At Mat table   STM w/ thea gun to  L lateral thigh  Blue ball  OHP 2x15  HS Curl green LLE x10, RLE 2x10  Horiz abd blue 2x10  LAQ LLE 5lb  2x15  Seated trunk rotations blue ball 2x15   06/09/23 NuStep L 5 x 7 min, x4 min LE only 15# in w/c straight arm pulls 15# rows 10# chest press At Mat table   4lb OHP   HS Curle green 2x10  LAQ 5lb 2x10  Seated trunk rotations blue ball 3x10  Horiz shoulder Abd blue 2x10  06/07/23 NuStep L 5 x 7 min, x4 min LE only 15# in w/c straight arm pulls 15# rows 10# chest press 20# biceps curls Leg press 40# 2x15 AT Mat table   4lb OHP   Crunches   Horiz shoulder Abd green 2x10  Seated march no UE support  06/01/23 At Mat table 5XSTS to The back of WC: 64 seconds really has to use his hands and is unsteady with standing Cannot stand on his own without holding onto something due to not having a L quad tendon MMT RUE flex 4/5 abd 4-/5,  LUE Flex 4/5, Abd 4/5 Seated military press 5lb 2x10 Seated rows blue 2x15 Horiz abd green 2x15 HS curls green x15, blue x 15  Seated crunches 2x15 Nustep level 6 10 minutes 3 minutes with LE's only  05/29/23 Bike level 5 x 6 minutes 15# in w/c straight arm pulls 15# rows 10# chest press 20# biceps curls 10# overhead press 55# leg curls both 2x10, right only 25# 2x10 Leg extension left only 10# 2x10 LEg press 40# 2x10, then single legs 2x10, right leg was eccentrics Pball in lap isometric abs Nustep level 6 LE only 2x10   PATIENT EDUCATION:  Education details: POC Person educated: Patient Education method: Explanation Education comprehension: verbalized understanding  HOME EXERCISE PROGRAM:  ASSESSMENT:  CLINICAL IMPRESSION: He enters doing ok, continue to work with him on strength globally, He has a failed extensor mechanism on the R leg, session focused on global strengthening to  remain independent and decrease the stress on his wife.  He is very impulsive and unsafe at times verbalizing not wanting help with transfers. Has  returned to a different MD to explore additional treatment options.   Patient is a 83 y.o. male who was seen today for physical therapy evaluation and treatment for repeated falls and balance issues, he has had 3 quadriceps tendon ruptures in the past year, his last rupture was not repaired so he has no active right knee extension.  He reports repeated falls, falling every few days. Pt I very impulsive and unsafe with transfers, he is no longer walking and is w/c dependent.   He will benefit from skilled PT to decrease his risk for falls and address his overall strength, function and safety   OBJECTIVE IMPAIRMENTS: Abnormal gait, cardiopulmonary status limiting activity, decreased activity tolerance, decreased balance, decreased coordination, decreased endurance, decreased mobility, difficulty walking, decreased ROM, decreased strength, improper body mechanics, postural dysfunction, and poor safety .   REHAB POTENTIAL: Fair dependent on carry over and patient compliance  CLINICAL DECISION MAKING: Evolving/moderate complexity  EVALUATION COMPLEXITY: Moderate  GOALS: Goals reviewed with patient? Yes  SHORT TERM GOALS: Target date: 05/29/23  Patient will be independent with initial HEP. Goal status: Met  05/10/23  LONG TERM GOALS: Target date: 07/29/23  Patient will be independent with advanced/ongoing HEP to improve outcomes and carryover.  Goal status: INITIAL  2.  Understand the need for safety with his transfers and all the steps to be as safe as possible Goal status: Ongoing 06/01/23  3.  Patient will demonstrate improved functional LE strength as demonstrated by < 25s on 5xSTS. Baseline: 83 seconds had to use hands and was unable to be independent with the standing Goal status: Progressing 06/01/23  4. Increase UE strength to 4+/5 for better transfers and function Goal status: Progressing  06/01/23  5.  Patient will report no falls in a 4 weeks period. Baseline: falls every few  days Goal status: Ongoing 06/01/23 fall last "20 or 21" of Feburary, ongoing 07/05/23  PLAN:  PT FREQUENCY: 2x/week  PT DURATION: 12 weeks  PLANNED INTERVENTIONS: Therapeutic exercises, Therapeutic activity, Neuromuscular re-education, Balance training, Gait training, Patient/Family education, Self Care, Joint mobilization, Stair training, Cryotherapy, Moist heat, and Manual therapy.  PLAN FOR NEXT SESSION: needs total body strength to help with his safety in transfers and independence.   Ollen Beverage, PTA

## 2023-07-24 ENCOUNTER — Ambulatory Visit: Admitting: Physical Therapy

## 2023-07-24 ENCOUNTER — Encounter: Payer: Self-pay | Admitting: Physical Therapy

## 2023-07-24 DIAGNOSIS — R296 Repeated falls: Secondary | ICD-10-CM

## 2023-07-24 DIAGNOSIS — R2689 Other abnormalities of gait and mobility: Secondary | ICD-10-CM | POA: Diagnosis not present

## 2023-07-24 DIAGNOSIS — M25561 Pain in right knee: Secondary | ICD-10-CM

## 2023-07-24 DIAGNOSIS — M6281 Muscle weakness (generalized): Secondary | ICD-10-CM

## 2023-07-24 DIAGNOSIS — M6249 Contracture of muscle, multiple sites: Secondary | ICD-10-CM

## 2023-07-24 NOTE — Therapy (Signed)
 OUTPATIENT PHYSICAL THERAPY TREATMENT      Patient Name: Brandon Mendoza. MRN: [id number] DOB:Jul 29, 1940, 83 y.o., male Today's Date: 07/24/2023  END OF SESSION:  PT End of Session - 07/24/23 0851     Visit Number 22    Date for PT Re-Evaluation 07/29/23    Authorization Type Medicare    PT Start Time 0844    PT Stop Time 0929    PT Time Calculation (min) 45 min    Activity Tolerance Patient tolerated treatment well    Behavior During Therapy [facility] for tasks assessed/performed;Impulsive             Past Medical History:  Diagnosis Date   Acquired clawfoot, right foot 12/16/2021   Anxiety    Atrophy of calf muscles 10/21/2021   Duplex calves was negative for significant blockage EMG was done at [facility] MRI lumbar spine was also done at [facility]   Carotid artery occlusion    CKD (chronic kidney disease)    Stage 3   COPD (chronic obstructive pulmonary disease) (HCC)    Depression    Dysrhythmia    PAF, atrial tachycardia   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   The spinal cord appears normal. 2.   No spinal stenosis. 3.   Mild multilevel degenerative changes as detailed above that do not lead to spinal stenosis or nerve root compression. 4.   T2 hyperintense foci within the pons consistent with chronic microvascular ischemic changes.   Loop - Medtronic Linq 10/22/2020 10/22/2020   Nocturia    Paroxysmal atrial fibrillation (HCC)    Peripheral neuropathy    Peripheral vascular disease (HCC)    Sleep apnea    on 6 cm, nasal pillow   Spinal stenosis    Stroke (HCC) 05/01/2011   ischemic   Weakness generalized 10/21/2021   Severe, legs and arms   Past Surgical History:  Procedure Laterality Date   CAROTID ENDARTERECTOMY  12/09/11    Left cea   ENDARTERECTOMY  12/09/2011   Procedure: ENDARTERECTOMY CAROTID;  Surgeon: Mayo Speck, MD;  Location: [facility] OR;  Service: Vascular;  Laterality: Left;   EYE SURGERY     rt retina detachment   HAMMER TOE SURGERY  2004   HERNIA REPAIR  2006   I & D EXTREMITY Right 10/07/2022   Procedure: IRRIGATION AND DEBRIDEMENT EXTREMITY WITH WOUND CLOSURE;  Surgeon: Mort Ards, MD;  Location: [facility] ORS;  Service: Orthopedics;  Laterality: Right;   QUADRICEPS TENDON REPAIR Right 07/12/2022   Procedure: REPAIR QUADRICEP TENDON;  Surgeon: Claiborne Crew, MD;  Location: [facility] ORS;  Service: Orthopedics;  Laterality: Right;   QUADRICEPS TENDON REPAIR Right 08/30/2022   Procedure: REPAIR QUADRICEP TENDON;  Surgeon: Claiborne Crew, MD;  Location: [facility] ORS;  Service: Orthopedics;  Laterality: Right;   SHOULDER ARTHROSCOPY W/ ROTATOR CUFF REPAIR  2010   TONSILLECTOMY     Patient Active Problem List   Diagnosis Date Noted   Coronary artery calcification 05/09/2023   Rupture of quadriceps tendon 11/03/2022   Wound dehiscence 10/07/2022   Quadriceps tendon rupture, right, subsequent encounter 08/30/2022   Quadriceps tendon rupture, right, initial encounter 07/12/2022   Unable to care for self 07/08/2022  Dehydration 07/08/2022   Depression 07/08/2022   COPD (chronic obstructive pulmonary disease) (HCC) 07/08/2022   Sialoadenitis of submandibular gland 04/28/2022   Macular degeneration 03/01/2022   Myofascial pain 02/10/2022   Lumbar spondylosis 01/03/2022   Acquired clawfoot, left foot 12/16/2021   Acquired clawfoot, right foot 12/16/2021   Claw foot 12/14/2021   Gait abnormality 12/10/2021   Spinal stenosis at L4-L5 level 12/10/2021   Ataxia 11/22/2021   Lightheadedness 11/02/2021   Claudication of lower extremity (HCC) 10/21/2021   Atrophy of calf muscles 10/21/2021   Weakness generalized 10/21/2021   Focal neurological deficit 10/21/2021   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   Memory impairment of gradual onset  10/21/2021   Pain in joint of right hip 09/14/2021   High arches 07/22/2021   Hammertoes of both feet 07/22/2021   Neuropathy 07/22/2021   Constipation 05/28/2021   Disequilibrium 05/28/2021   History of colonic polyps 05/28/2021   Renal cyst 05/28/2021   Gastroesophageal reflux disease without esophagitis 05/05/2021   Ingrowing left great toenail 02/15/2021   S/P lumbar fusion 12/24/2020   Spinal stenosis of lumbar region 12/04/2020   Acquired complex renal cyst 11/23/2020   Functional gait abnormality 11/04/2020   Idiopathic neuropathy 11/02/2020   Tinea pedis of both feet 10/28/2020   Loop - Medtronic Linq 10/22/2020 10/22/2020   Benign hypertension with 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) 10/05/2020   Fall with injury 10/05/2020   Hematoma 09/16/2020   Normocytic anemia 09/14/2020   Leukocytosis 09/14/2020   AF (paroxysmal atrial fibrillation) (HCC) 09/14/2020   Chronic anticoagulation 09/14/2020   Hyponatremia 09/14/2020   Pain in left foot 01/22/2020   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   Pain in joint of right knee 12/27/2019   Recurrent falls 11/17/2019   Paroxysmal atrial fibrillation (HCC) 10/22/2019   Pain in right foot 10/14/2019   Falls 10/07/2019   Ataxia involving legs 08/12/2019   OSA on CPAP 06/20/2018   Urge incontinence 04/13/2018   Urinary urgency 04/13/2018   Pain in left knee 03/01/2018   Other intervertebral disc degeneration, lumbar region 06/16/2017   Acquired bilateral hammer toes 10/05/2015   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   Generalized anxiety disorder 05/12/2015   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   History of left-sided carotid endarterectomy 04/10/2014   Essential hypertension 04/10/2014   Occlusion and stenosis of carotid artery without mention of cerebral infarction 12/15/2011   Habitual alcohol use  12/01/2011   Carotid artery stenosis, symptomatic 11/30/2011   Cerebral infarction (HCC) 11/29/2011   Mixed hyperlipidemia     PCP: Scherrie Curt  REFERRING PROVIDER: Scherrie Curt  REFERRING DIAG: weakness, multiple falls  Rationale for Evaluation and Treatment: Rehabilitation  THERAPY DIAG:  Other abnormalities of gait and mobility  Repeated falls  Muscle weakness (generalized)  Acute pain of right knee  Contracture of muscle, multiple sites  ONSET DATE: 10/07/22  SUBJECTIVE:  SUBJECTIVE STATEMENT: No falls  PERTINENT HISTORY:  Cerebral infarct 10/21/21, lumbar fusion, recurrent falls, has significant neuropathy and this is the reason for most of his falls   PAIN:  Are you having pain? Yes: NPRS scale: 0/10 Pain location: right knee Pain description: ache  Aggravating factors: being on my feet  Relieving factors: movement  PRECAUTIONS: Fall  WEIGHT BEARING RESTRICTIONS: No  FALLS:  Has patient fallen in last 6 months? Yes. Number of falls every few days   LIVING ENVIRONMENT: Lives with: lives with their spouse Lives in: House/apartment Stairs: Yes: Internal: 20 steps; on right going up Has following equipment at home: Otho Blitz - 4 wheeled, Wheelchair (manual), shower chair, Tour manager, Grab bars, Ramped entry, and trekking pole  OCCUPATION: Retired  PLOF: Independent, prior to a year ago he was driving used trekking poles to walk  PATIENT GOALS: live at home  NEXT MD VISIT:   OBJECTIVE:   DIAGNOSTIC FINDINGS:    LUMBAR SPINE FINDINGS: 1. Wide laminectomy and posterior pedicle screw and rod fixation at L4-5 without residual or recurrent stenosis. 2. Progressive adjacent level disease at L3-4 with moderate right and mild left subarticular narrowing and moderate  foraminal stenosis bilaterally. Progression is predominantly due to facet disease 3. Progressive moderate facet hypertrophy at L1-2 with a progressive rightward disc protrusion resulting in progressive moderate right foraminal stenosis. Asymmetric endplate marrow edema on the right at L1-2 is consistent with progressive disease as well. 4. Otherwise stable degenerative change without other focal stenosis.   THORACIC SPINE FINDINGS:  On sagittal views the vertebral bodies have normal height and alignment.  Hemangioma within T9 vertebral body and slightly within T10 vertebral body.  Anterior kyphosis.  Mild scoliosis convex right centered at T8 level. The spinal cord is normal in size and appearance. The paraspinal soft tissues are unremarkable.     On axial views there is no spinal stenosis or foraminal narrowing.  Limited views of the aorta, kidneys, liver, lungs and paraspinal muscles are notable for multiple large right renal cysts ranging in size from 1.5 to 7 cm.   COGNITION: Overall cognitive status: Within functional limits for tasks assessed     SENSATION: WFL  MUSCLE LENGTH: Hamstrings: mod tightness on both sides  POSTURE: rounded shoulders and forward head  LOWER EXTREMITY ROM:   the right knee has HS, has no quad tendon on the right, unable to lift the leg, left leg WFL's , left hip and right hip and ankles WFL's   LOWER EXTREMITY MMT: 4-/5 for the LE's except for the right knee extension UPPER EXTREMITY MMT:  4-/5 for shoulders and elbows   FUNCTIONAL TESTS:  5XSTS: 83 seconds really has to use his hands and is unsteady with standing Cannot stand on his own without holding onto something due to poor balance   Transfers:  needs set up and MinA for safety, is impulsive and unsafe at times. GAIT: He is w/c dependent    TODAY'S TREATMENT:  DATE:  07/24/23 Nustep level 6 x 8 minutes, no arms level 5 x 3 minutes Leg curls 45# 2x15, then right only 25# 2x10 Leg extension 20# left only x10, then 15# x10 Had a brace on the right knee could not get it right to try w/b on the right  Feet on ball bridges, rotation, isometric abs Brace locked SLR, hip abduction  07/21/23 Nustep level 6 x 8 minutes, LE only L5 x 3 min Seated in w/c 15# rows, straight arm pulls 15lb  biceps 10lb 2x15 10# each pulley chest press At Mat table  Seated Crunches    W/ OHP yellow wball  Seated trunk rotatons bluw ba//  Horiz abd blue 2x10  HS curls blue 2x15  07/19/23 Nustep level 6 x 7 minutes Seated in w/c 15# rows, straight arm pulls, 15# biceps 2x15 10# each pulley chest press 20# single leg curls 2x15 Supine feet on ball bridge, rotation, isometric abs Black tband clamshells Ball b/n knees squeeze with bridge Black tband hip extension  07/05/23 Nustep level 6 x 7 minutes, level 5 LE only x 5 minutes Biceps 15#  Rows 15# Extension 15# Feet on ball trunk rotation, bridges, isometric abs Black tband clamshells in [employer] squeeze with bridge In Pbars high knee raises PT blocking the right knee  07/03/23 NuStep L6 x7 min, x  L5 4 min LE only Shoulder Ext in w/c 15lb  10# biceps curls 15# rows  Chest press 10 2x15 Fitter press 2 blue 1 black 2x15 Sit to stand for elevated surface to w/c HS curls blue 2x15   06/29/23 NuStep L6 x23 min, x 7 min LE only  15# in w/c straight arm pulls 15# biceps curls  06/27/23 NuStep L6 x6 min, x 4 min LE only  15# in w/c straight arm pulls 15# rows 15# biceps curls 10# chest press Overhead Triceps  ext 2x15 Chest flys 10lb 2x15 WC dips x5, x5, x10 Hs curls blue 2x10  05/2823 UBE L1 x 2 min each  NuStep L6 x4 min, x LE only  Seated at mat without Back support LAQ 5lb 2x15 Seated march 5lb 2x15   UE MMT grosley 4+/5  Blue ball  OHP 2x10  Seated trunk rotations blue ball 2x15  HS  curls blue 2x15   06/20/23 NuStep L 5 x 7 min, x4 min LE only RLE hip abd and flex in // bars 2x10 15# in w/c straight arm pulls 15# rows 15# biceps curls 10# chest press At Mat table  Seated trunk rotations blue ball 2x15  Blue ball  OHP 2x10  Horiz abd blue 2x15  06/12/23 NuStep L 5 x 7 min, x3 min LE only At Mat table   STM w/ thea gun to L lateral thigh  Blue ball  OHP 2x15  HS Curl green LLE x10, RLE 2x10  Horiz abd blue 2x10  LAQ LLE 5lb  2x15  Seated trunk rotations blue ball 2x15   06/09/23 NuStep L 5 x 7 min, x4 min LE only 15# in w/c straight arm pulls 15# rows 10# chest press At Mat table   4lb OHP   HS Curle green 2x10  LAQ 5lb 2x10  Seated trunk rotations blue ball 3x10  Horiz shoulder Abd blue 2x10 PATIENT EDUCATION:  Education details: POC Person educated: Patient Education method: Explanation Education comprehension: verbalized understanding  HOME EXERCISE PROGRAM:  ASSESSMENT:  CLINICAL IMPRESSION: He enters doing ok, continue to work with him on strength globally,  He has a failed extensor mechanism on the R leg, session focused on global strengthening to  remain independent and decrease the stress on his wife.  He is very impulsive and unsafe at times verbalizing not wanting help with transfers. Has returned to a different MD to explore additional treatment options.  He brought in the brace that has a locking mechanism, we tried to get it positioned right but due to absolutely no active extension of the knee the knee would give and he would feel like he is falling, asked him to bring it back and would like to try again in the parallel bars and have him activate the glutes to try to hold better   Patient is a 83 y.o. male who was seen today for physical therapy evaluation and treatment for repeated falls and balance issues, he has had 3 quadriceps tendon ruptures in the past year, his last rupture was not repaired so he has no active right knee  extension.  He reports repeated falls, falling every few days. Pt I very impulsive and unsafe with transfers, he is no longer walking and is w/c dependent.   He will benefit from skilled PT to decrease his risk for falls and address his overall strength, function and safety   OBJECTIVE IMPAIRMENTS: Abnormal gait, cardiopulmonary status limiting activity, decreased activity tolerance, decreased balance, decreased coordination, decreased endurance, decreased mobility, difficulty walking, decreased ROM, decreased strength, improper body mechanics, postural dysfunction, and poor safety .   REHAB POTENTIAL: Fair dependent on carry over and patient compliance  CLINICAL DECISION MAKING: Evolving/moderate complexity  EVALUATION COMPLEXITY: Moderate  GOALS: Goals reviewed with patient? Yes  SHORT TERM GOALS: Target date: 05/29/23  Patient will be independent with initial HEP. Goal status: Met  05/10/23  LONG TERM GOALS: Target date: 07/29/23  Patient will be independent with advanced/ongoing HEP to improve outcomes and carryover.  Goal status: INITIAL  2.  Understand the need for safety with his transfers and all the steps to be as safe as possible Goal status: Ongoing 06/01/23  3.  Patient will demonstrate improved functional LE strength as demonstrated by < 25s on 5xSTS. Baseline: 83 seconds had to use hands and was unable to be independent with the standing Goal status: Progressing 06/01/23  4. Increase UE strength to 4+/5 for better transfers and function Goal status: Progressing  06/01/23  5.  Patient will report no falls in a 4 weeks period. Baseline: falls every few days Goal status: Ongoing 06/01/23 fall last "20 or 21" of Feburary, ongoing 07/05/23  PLAN:  PT FREQUENCY: 2x/week  PT DURATION: 12 weeks  PLANNED INTERVENTIONS: Therapeutic exercises, Therapeutic activity, Neuromuscular re-education, Balance training, Gait training, Patient/Family education, Self Care, Joint mobilization,  Stair training, Cryotherapy, Moist heat, and Manual therapy.  PLAN FOR NEXT SESSION: needs total body strength to help with his safety in transfers and independence. Try the brace and him activating glutes  Hollis Lurie, PT

## 2023-07-26 ENCOUNTER — Ambulatory Visit: Admitting: Physical Therapy

## 2023-07-26 ENCOUNTER — Encounter: Payer: Self-pay | Admitting: Physical Therapy

## 2023-07-26 DIAGNOSIS — R2689 Other abnormalities of gait and mobility: Secondary | ICD-10-CM

## 2023-07-26 DIAGNOSIS — R296 Repeated falls: Secondary | ICD-10-CM

## 2023-07-26 DIAGNOSIS — M6281 Muscle weakness (generalized): Secondary | ICD-10-CM

## 2023-07-26 DIAGNOSIS — M25561 Pain in right knee: Secondary | ICD-10-CM

## 2023-07-26 NOTE — Therapy (Signed)
 OUTPATIENT PHYSICAL THERAPY TREATMENT      Patient Name: Brandon Mendoza. MRN: [id number] DOB:03-19-1941, 83 y.o., male Today's Date: 07/26/2023  END OF SESSION:  PT End of Session - 07/26/23 0845     Visit Number 23    Date for PT Re-Evaluation 07/29/23    Authorization Type Medicare    PT Start Time [id number]    PT Stop Time 0930    PT Time Calculation (min) 48 min    Activity Tolerance Patient tolerated treatment well    Behavior During Therapy [facility] for tasks assessed/performed;Impulsive             Past Medical History:  Diagnosis Date   Acquired clawfoot, right foot 12/16/2021   Anxiety    Atrophy of calf muscles 10/21/2021   Duplex calves was negative for significant blockage EMG was done at [facility] MRI lumbar spine was also done at [facility]   Carotid artery occlusion    CKD (chronic kidney disease)    Stage 3   COPD (chronic obstructive pulmonary disease) (HCC)    Depression    Dysrhythmia    PAF, atrial tachycardia   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   The spinal cord appears normal. 2.   No spinal stenosis. 3.   Mild multilevel degenerative changes as detailed above that do not lead to spinal stenosis or nerve root compression. 4.   T2 hyperintense foci within the pons consistent with chronic microvascular ischemic changes.   Loop - Medtronic Linq 10/22/2020 10/22/2020   Nocturia    Paroxysmal atrial fibrillation (HCC)    Peripheral neuropathy    Peripheral vascular disease (HCC)    Sleep apnea    on 6 cm, nasal pillow   Spinal stenosis    Stroke (HCC) 05/01/2011   ischemic   Weakness generalized 10/21/2021   Severe, legs and arms   Past Surgical History:  Procedure Laterality Date   CAROTID ENDARTERECTOMY  12/09/11    Left cea   ENDARTERECTOMY  12/09/2011   Procedure: ENDARTERECTOMY CAROTID;  Surgeon: Mayo Speck, MD;  Location: [facility] OR;  Service: Vascular;  Laterality: Left;   EYE SURGERY     rt retina detachment   HAMMER TOE SURGERY  2004   HERNIA REPAIR  2006   I & D EXTREMITY Right 10/07/2022   Procedure: IRRIGATION AND DEBRIDEMENT EXTREMITY WITH WOUND CLOSURE;  Surgeon: Mort Ards, MD;  Location: [facility];  Service: Orthopedics;  Laterality: Right;   QUADRICEPS TENDON REPAIR Right 07/12/2022   Procedure: REPAIR QUADRICEP TENDON;  Surgeon: Claiborne Crew, MD;  Location: [facility];  Service: Orthopedics;  Laterality: Right;   QUADRICEPS TENDON REPAIR Right 08/30/2022   Procedure: REPAIR QUADRICEP TENDON;  Surgeon: Claiborne Crew, MD;  Location: [facility];  Service: Orthopedics;  Laterality: Right;   SHOULDER ARTHROSCOPY W/ ROTATOR CUFF REPAIR  2010   TONSILLECTOMY     Patient Active Problem List   Diagnosis Date Noted   Coronary artery calcification 05/09/2023   Rupture of quadriceps tendon 11/03/2022   Wound dehiscence 10/07/2022   Quadriceps tendon rupture, right, subsequent encounter 08/30/2022   Quadriceps tendon rupture, right, initial encounter 07/12/2022   Unable to care for self 07/08/2022  Dehydration 07/08/2022   Depression 07/08/2022   COPD (chronic obstructive pulmonary disease) (HCC) 07/08/2022   Sialoadenitis of submandibular gland 04/28/2022   Macular degeneration 03/01/2022   Myofascial pain 02/10/2022   Lumbar spondylosis 01/03/2022   Acquired clawfoot, left foot 12/16/2021   Acquired clawfoot, right foot 12/16/2021   Claw foot 12/14/2021   Gait abnormality 12/10/2021   Spinal stenosis at L4-L5 level 12/10/2021   Ataxia 11/22/2021   Lightheadedness 11/02/2021   Claudication of lower extremity (HCC) 10/21/2021   Atrophy of calf muscles 10/21/2021   Weakness generalized 10/21/2021   Focal neurological deficit 10/21/2021   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   Memory impairment of gradual onset  10/21/2021   Pain in joint of right hip 09/14/2021   High arches 07/22/2021   Hammertoes of both feet 07/22/2021   Neuropathy 07/22/2021   Constipation 05/28/2021   Disequilibrium 05/28/2021   History of colonic polyps 05/28/2021   Renal cyst 05/28/2021   Gastroesophageal reflux disease without esophagitis 05/05/2021   Ingrowing left great toenail 02/15/2021   S/P lumbar fusion 12/24/2020   Spinal stenosis of lumbar region 12/04/2020   Acquired complex renal cyst 11/23/2020   Functional gait abnormality 11/04/2020   Idiopathic neuropathy 11/02/2020   Tinea pedis of both feet 10/28/2020   Loop - Medtronic Linq 10/22/2020 10/22/2020   Benign hypertension with 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) 10/05/2020   Fall with injury 10/05/2020   Hematoma 09/16/2020   Normocytic anemia 09/14/2020   Leukocytosis 09/14/2020   AF (paroxysmal atrial fibrillation) (HCC) 09/14/2020   Chronic anticoagulation 09/14/2020   Hyponatremia 09/14/2020   Pain in left foot 01/22/2020   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   Pain in joint of right knee 12/27/2019   Recurrent falls 11/17/2019   Paroxysmal atrial fibrillation (HCC) 10/22/2019   Pain in right foot 10/14/2019   Falls 10/07/2019   Ataxia involving legs 08/12/2019   OSA on CPAP 06/20/2018   Urge incontinence 04/13/2018   Urinary urgency 04/13/2018   Pain in left knee 03/01/2018   Other intervertebral disc degeneration, lumbar region 06/16/2017   Acquired bilateral hammer toes 10/05/2015   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   Generalized anxiety disorder 05/12/2015   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   History of left-sided carotid endarterectomy 04/10/2014   Essential hypertension 04/10/2014   Occlusion and stenosis of carotid artery without mention of cerebral infarction 12/15/2011   Habitual alcohol use  12/01/2011   Carotid artery stenosis, symptomatic 11/30/2011   Cerebral infarction (HCC) 11/29/2011   Mixed hyperlipidemia     PCP: Scherrie Curt  REFERRING PROVIDER: Scherrie Curt  REFERRING DIAG: weakness, multiple falls  Rationale for Evaluation and Treatment: Rehabilitation  THERAPY DIAG:  Other abnormalities of gait and mobility  Repeated falls  Muscle weakness (generalized)  Acute pain of right knee  ONSET DATE: 10/07/22  SUBJECTIVE:  SUBJECTIVE STATEMENT: No falls, he did bring in the brace  PERTINENT HISTORY:  Cerebral infarct 10/21/21, lumbar fusion, recurrent falls, has significant neuropathy and this is the reason for most of his falls   PAIN:  Are you having pain? Yes: NPRS scale: 0/10 Pain location: right knee Pain description: ache  Aggravating factors: being on my feet  Relieving factors: movement  PRECAUTIONS: Fall  WEIGHT BEARING RESTRICTIONS: No  FALLS:  Has patient fallen in last 6 months? Yes. Number of falls every few days   LIVING ENVIRONMENT: Lives with: lives with their spouse Lives in: House/apartment Stairs: Yes: Internal: 20 steps; on right going up Has following equipment at home: Oth[employer] Blitz - 4 wheeled, Wheelchair (manual), shower chair, Tour manager, Grab bars, Ramped entry, and trekking pole  OCCUPATION: Retired  PLOF: Independent, prior to a year ago he was driving used trekking poles to walk  PATIENT GOALS: live at home  NEXT MD VISIT:   OBJECTIVE:   DIAGNOSTIC FINDINGS:    LUMBAR SPINE FINDINGS: 1. Wide laminectomy and posterior pedicle screw and rod fixation at L4-5 without residual or recurrent stenosis. 2. Progressive adjacent level disease at L3-4 with moderate right and mild left subarticular narrowing and moderate foraminal  stenosis bilaterally. Progression is predominantly due to facet disease 3. Progressive moderate facet hypertrophy at L1-2 with a progressive rightward disc protrusion resulting in progressive moderate right foraminal stenosis. Asymmetric endplate marrow edema on the right at L1-2 is consistent with progressive disease as well. 4. Otherwise stable degenerative change without other focal stenosis.   THORACIC SPINE FINDINGS:  On sagittal views the vertebral bodies have normal height and alignment.  Hemangioma within T9 vertebral body and slightly within T10 vertebral body.  Anterior kyphosis.  Mild scoliosis convex right centered at T8 level. The spinal cord is normal in size and appearance. The paraspinal soft tissues are unremarkable.     On axial views there is no spinal stenosis or foraminal narrowing.  Limited views of the aorta, kidneys, liver, lungs and paraspinal muscles are notable for multiple large right renal cysts ranging in size from 1.5 to 7 cm.   COGNITION: Overall cognitive status: Within functional limits for tasks assessed     SENSATION: WFL  MUSCLE LENGTH: Hamstrings: mod tightness on both sides  POSTURE: rounded shoulders and forward head  LOWER EXTREMITY ROM:   the right knee has HS, has no quad tendon on the right, unable to lift the leg, left leg WFL's , left hip and right hip and ankles WFL's   LOWER EXTREMITY MMT: 4-/5 for the LE's except for the right knee extension UPPER EXTREMITY MMT:  4-/5 for shoulders and elbows   FUNCTIONAL TESTS:  5XSTS: 83 seconds really has to use his hands and is unsteady with standing Cannot stand on his own without holding onto something due to poor balance   Transfers:  needs set up and MinA for safety, is impulsive and unsafe at times. GAIT: He is w/c dependent    TODAY'S TREATMENT:  DATE:   07/26/23 Nustep level 6 x 8 minutes, no arms level 5 x 3 minutes In Pbars with brace locked trying to do some weight shifts and weight bearing on the right leg with him lifting the left Passive stretch right HS Seated pulley 15# each side rows and straight arm pulls 10# each pulley seated chest press Feet on ball K2C, rotation, bridges, isometric abs Brace locked right leg SLR's Side lying abduction Supine passive right knee extension Supine black tband hip extension Education of the patient  07/24/23 Nustep level 6 x 8 minutes, no arms level 5 x 3 minutes Leg curls 45# 2x15, then right only 25# 2x10 Leg extension 20# left only x10, then 15# x10 Had a brace on the right knee could not get it right to try w/b on the right  Feet on ball bridges, rotation, isometric abs Brace locked SLR, hip abduction  07/21/23 Nustep level 6 x 8 minutes, LE only L5 x 3 min Seated in w/c 15# rows, straight arm pulls 15lb  biceps 10lb 2x15 10# each pulley chest press At Mat table  Seated Crunches    W/ OHP yellow wball  Seated trunk rotatons bluw ba//  Horiz abd blue 2x10  HS curls blue 2x15  07/19/23 Nustep level 6 x 7 minutes Seated in w/c 15# rows, straight arm pulls, 15# biceps 2x15 10# each pulley chest press 20# single leg curls 2x15 Supine feet on ball bridge, rotation, isometric abs Black tband clamshells Ball b/n knees squeeze with bridge Black tband hip extension  07/05/23 Nustep level 6 x 7 minutes, level 5 LE only x 5 minutes Biceps 15#  Rows 15# Extension 15# Feet on ball trunk rotation, bridges, isometric abs Black tband clamshells in [employer] squeeze with bridge In Pbars high knee raises PT blocking the right knee  07/03/23 NuStep L6 x7 min, x  L5 4 min LE only Shoulder Ext in w/c 15lb  10# biceps curls 15# rows  Chest press 10 2x15 Fitter press 2 blue 1 black 2x15 Sit to stand for elevated surface to w/c HS curls blue 2x15   06/29/23 NuStep L6 x23 min, x 7 min  LE only  15# in w/c straight arm pulls 15# biceps curls  06/27/23 NuStep L6 x6 min, x 4 min LE only  15# in w/c straight arm pulls 15# rows 15# biceps curls 10# chest press Overhead Triceps  ext 2x15 Chest flys 10lb 2x15 WC dips x5, x5, x10 Hs curls blue 2x10  05/2823 UBE L1 x 2 min each  NuStep L6 x4 min, x LE only  Seated at mat without Back support LAQ 5lb 2x15 Seated march 5lb 2x15   UE MMT grosley 4+/5  Blue ball  OHP 2x10  Seated trunk rotations blue ball 2x15  HS curls blue 2x15   06/20/23 NuStep L 5 x 7 min, x4 min LE only RLE hip abd and flex in // bars 2x10 15# in w/c straight arm pulls 15# rows 15# biceps curls 10# chest press At Mat table  Seated trunk rotations blue ball 2x15  Blue ball  OHP 2x10  Horiz abd blue 2x15  06/12/23 NuStep L 5 x 7 min, x3 min LE only At Mat table   STM w/ thea gun to L lateral thigh  Blue ball  OHP 2x15  HS Curl green LLE x10, RLE 2x10  Horiz abd blue 2x10  LAQ LLE 5lb  2x15  Seated trunk rotations blue ball 2x15  06/09/23 NuStep L 5 x 7 min, x4 min LE only 15# in w/c straight arm pulls 15# rows 10# chest press At Mat table   4lb OHP   HS Curle green 2x10  LAQ 5lb 2x10  Seated trunk rotations blue ball 3x10  Horiz shoulder Abd blue 2x10 PATIENT EDUCATION:  Education details: POC Person educated: Patient Education method: Explanation Education comprehension: verbalized understanding  HOME EXERCISE PROGRAM:  ASSESSMENT:  CLINICAL IMPRESSION: I tried to do a lot of education with the patient on the need to activate the hip and show him that he has no ability to hold himself with the right knee if he does not activate the hip to lock it out, I have a lot of worries with him as he is so impulsive and most of the time is unsafe with transfers, we talked aobut if he does have a surgery (he is seeing surgeon in two weeks) that if he does not follow protocol he will tear it again.  Even with the brace on there is  so much play that it will buckle quickly.  I did do some stretching to help get this straight   Patient is a 83 y.o. male who was seen today for physical therapy evaluation and treatment for repeated falls and balance issues, he has had 3 quadriceps tendon ruptures in the past year, his last rupture was not repaired so he has no active right knee extension.  He reports repeated falls, falling every few days. Pt I very impulsive and unsafe with transfers, he is no longer walking and is w/c dependent.   He will benefit from skilled PT to decrease his risk for falls and address his overall strength, function and safety   OBJECTIVE IMPAIRMENTS: Abnormal gait, cardiopulmonary status limiting activity, decreased activity tolerance, decreased balance, decreased coordination, decreased endurance, decreased mobility, difficulty walking, decreased ROM, decreased strength, improper body mechanics, postural dysfunction, and poor safety .   REHAB POTENTIAL: Fair dependent on carry over and patient compliance  CLINICAL DECISION MAKING: Evolving/moderate complexity  EVALUATION COMPLEXITY: Moderate  GOALS: Goals reviewed with patient? Yes  SHORT TERM GOALS: Target date: 05/29/23  Patient will be independent with initial HEP. Goal status: Met  05/10/23  LONG TERM GOALS: Target date: 07/29/23  Patient will be independent with advanced/ongoing HEP to improve outcomes and carryover.  Goal status: INITIAL  2.  Understand the need for safety with his transfers and all the steps to be as safe as possible Goal status: Ongoing 06/01/23  3.  Patient will demonstrate improved functional LE strength as demonstrated by < 25s on 5xSTS. Baseline: 83 seconds had to use hands and was unable to be independent with the standing Goal status: Progressing 07/26/23  4. Increase UE strength to 4+/5 for better transfers and function Goal status: Progressing  07/26/23  5.  Patient will report no falls in a 4 weeks  period. Baseline: falls every few days Goal status: Ongoing 06/01/23 fall last "20 or 21" of Feburary, ongoing 07/05/23  PLAN:  PT FREQUENCY: 2x/week  PT DURATION: 12 weeks  PLANNED INTERVENTIONS: Therapeutic exercises, Therapeutic activity, Neuromuscular re-education, Balance training, Gait training, Patient/Family education, Self Care, Joint mobilization, Stair training, Cryotherapy, Moist heat, and Manual therapy.  PLAN FOR NEXT SESSION: needs total body strength to help with his safety in transfers and independence. Try the brace and him activating glutes  Hollis Lurie, PT

## 2023-07-31 ENCOUNTER — Ambulatory Visit: Attending: Internal Medicine | Admitting: Physical Therapy

## 2023-07-31 ENCOUNTER — Encounter: Payer: Self-pay | Admitting: Physical Therapy

## 2023-07-31 DIAGNOSIS — M6249 Contracture of muscle, multiple sites: Secondary | ICD-10-CM | POA: Insufficient documentation

## 2023-07-31 DIAGNOSIS — R296 Repeated falls: Secondary | ICD-10-CM | POA: Diagnosis present

## 2023-07-31 DIAGNOSIS — R2689 Other abnormalities of gait and mobility: Secondary | ICD-10-CM | POA: Diagnosis present

## 2023-07-31 DIAGNOSIS — M5459 Other low back pain: Secondary | ICD-10-CM | POA: Insufficient documentation

## 2023-07-31 DIAGNOSIS — M6281 Muscle weakness (generalized): Secondary | ICD-10-CM | POA: Diagnosis present

## 2023-07-31 DIAGNOSIS — M25561 Pain in right knee: Secondary | ICD-10-CM | POA: Insufficient documentation

## 2023-07-31 NOTE — Therapy (Signed)
 OUTPATIENT PHYSICAL THERAPY TREATMENT      Patient Name: Brandon Mendoza. MRN: [id number] DOB:06-12-1940, 83 y.o., male Today's Date: 07/31/2023  END OF SESSION:  PT End of Session - 07/31/23 0844     Visit Number 24    Date for PT Re-Evaluation 08/31/23    Authorization Type Medicare    PT Start Time 0843    PT Stop Time 0930    PT Time Calculation (min) 47 min    Activity Tolerance Patient tolerated treatment well    Behavior During Therapy [facility] for tasks assessed/performed;Impulsive             Past Medical History:  Diagnosis Date   Acquired clawfoot, right foot 12/16/2021   Anxiety    Atrophy of calf muscles 10/21/2021   Duplex calves was negative for significant blockage EMG was done at [facility] MRI lumbar spine was also done at [facility]   Carotid artery occlusion    CKD (chronic kidney disease)    Stage 3   COPD (chronic obstructive pulmonary disease) (HCC)    Depression    Dysrhythmia    PAF, atrial tachycardia   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   The spinal cord appears normal. 2.   No spinal stenosis. 3.   Mild multilevel degenerative changes as detailed above that do not lead to spinal stenosis or nerve root compression. 4.   T2 hyperintense foci within the pons consistent with chronic microvascular ischemic changes.   Loop - Medtronic Linq 10/22/2020 10/22/2020   Nocturia    Paroxysmal atrial fibrillation (HCC)    Peripheral neuropathy    Peripheral vascular disease (HCC)    Sleep apnea    on 6 cm, nasal pillow   Spinal stenosis    Stroke (HCC) 05/01/2011   ischemic   Weakness generalized 10/21/2021   Severe, legs and arms   Past Surgical History:  Procedure Laterality Date   CAROTID ENDARTERECTOMY  12/09/11    Left cea   ENDARTERECTOMY  12/09/2011   Procedure: ENDARTERECTOMY CAROTID;  Surgeon: Mayo Speck, MD;  Location: [facility] OR;  Service: Vascular;  Laterality: Left;   EYE SURGERY     rt retina detachment   HAMMER TOE SURGERY  2004   HERNIA REPAIR  2006   I & D EXTREMITY Right 10/07/2022   Procedure: IRRIGATION AND DEBRIDEMENT EXTREMITY WITH WOUND CLOSURE;  Surgeon: Mort Ards, MD;  Location: [facility] ORS;  Service: Orthopedics;  Laterality: Right;   QUADRICEPS TENDON REPAIR Right 07/12/2022   Procedure: REPAIR QUADRICEP TENDON;  Surgeon: Claiborne Crew, MD;  Location: [facility] ORS;  Service: Orthopedics;  Laterality: Right;   QUADRICEPS TENDON REPAIR Right 08/30/2022   Procedure: REPAIR QUADRICEP TENDON;  Surgeon: Claiborne Crew, MD;  Location: [facility] ORS;  Service: Orthopedics;  Laterality: Right;   SHOULDER ARTHROSCOPY W/ ROTATOR CUFF REPAIR  2010   TONSILLECTOMY     Patient Active Problem List   Diagnosis Date Noted   Coronary artery calcification 05/09/2023   Rupture of quadriceps tendon 11/03/2022   Wound dehiscence 10/07/2022   Quadriceps tendon rupture, right, subsequent encounter 08/30/2022   Quadriceps tendon rupture, right, initial encounter 07/12/2022   Unable to care for self 07/08/2022  Dehydration 07/08/2022   Depression 07/08/2022   COPD (chronic obstructive pulmonary disease) (HCC) 07/08/2022   Sialoadenitis of submandibular gland 04/28/2022   Macular degeneration 03/01/2022   Myofascial pain 02/10/2022   Lumbar spondylosis 01/03/2022   Acquired clawfoot, left foot 12/16/2021   Acquired clawfoot, right foot 12/16/2021   Claw foot 12/14/2021   Gait abnormality 12/10/2021   Spinal stenosis at L4-L5 level 12/10/2021   Ataxia 11/22/2021   Lightheadedness 11/02/2021   Claudication of lower extremity (HCC) 10/21/2021   Atrophy of calf muscles 10/21/2021   Weakness generalized 10/21/2021   Focal neurological deficit 10/21/2021   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   Memory impairment of gradual onset  10/21/2021   Pain in joint of right hip 09/14/2021   High arches 07/22/2021   Hammertoes of both feet 07/22/2021   Neuropathy 07/22/2021   Constipation 05/28/2021   Disequilibrium 05/28/2021   History of colonic polyps 05/28/2021   Renal cyst 05/28/2021   Gastroesophageal reflux disease without esophagitis 05/05/2021   Ingrowing left great toenail 02/15/2021   S/P lumbar fusion 12/24/2020   Spinal stenosis of lumbar region 12/04/2020   Acquired complex renal cyst 11/23/2020   Functional gait abnormality 11/04/2020   Idiopathic neuropathy 11/02/2020   Tinea pedis of both feet 10/28/2020   Loop - Medtronic Linq 10/22/2020 10/22/2020   Benign hypertension with 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) 10/05/2020   Fall with injury 10/05/2020   Hematoma 09/16/2020   Normocytic anemia 09/14/2020   Leukocytosis 09/14/2020   AF (paroxysmal atrial fibrillation) (HCC) 09/14/2020   Chronic anticoagulation 09/14/2020   Hyponatremia 09/14/2020   Pain in left foot 01/22/2020   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   Pain in joint of right knee 12/27/2019   Recurrent falls 11/17/2019   Paroxysmal atrial fibrillation (HCC) 10/22/2019   Pain in right foot 10/14/2019   Falls 10/07/2019   Ataxia involving legs 08/12/2019   OSA on CPAP 06/20/2018   Urge incontinence 04/13/2018   Urinary urgency 04/13/2018   Pain in left knee 03/01/2018   Other intervertebral disc degeneration, lumbar region 06/16/2017   Acquired bilateral hammer toes 10/05/2015   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   Generalized anxiety disorder 05/12/2015   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   History of left-sided carotid endarterectomy 04/10/2014   Essential hypertension 04/10/2014   Occlusion and stenosis of carotid artery without mention of cerebral infarction 12/15/2011   Habitual alcohol use  12/01/2011   Carotid artery stenosis, symptomatic 11/30/2011   Cerebral infarction (HCC) 11/29/2011   Mixed hyperlipidemia     PCP: Scherrie Curt  REFERRING PROVIDER: Scherrie Curt  REFERRING DIAG: weakness, multiple falls  Rationale for Evaluation and Treatment: Rehabilitation  THERAPY DIAG:  Other abnormalities of gait and mobility  Repeated falls  Muscle weakness (generalized)  Acute pain of right knee  Contracture of muscle, multiple sites  ONSET DATE: 10/07/22  SUBJECTIVE:  SUBJECTIVE STATEMENT: No falls, reports he has an MRI next week and then an appointment with a surgeon on the 22nd  PERTINENT HISTORY:  Cerebral infarct 10/21/21, lumbar fusion, recurrent falls, has significant neuropathy and this is the reason for most of his falls   PAIN:  Are you having pain? Yes: NPRS scale: 0/10 Pain location: right knee Pain description: ache  Aggravating factors: being on my feet  Relieving factors: movement  PRECAUTIONS: Fall  WEIGHT BEARING RESTRICTIONS: No  FALLS:  Has patient fallen in last 6 months? Yes. Number of falls every few days   LIVING ENVIRONMENT: Lives with: lives with their spouse Lives in: House/apartment Stairs: Yes: Internal: 20 steps; on right going up Has following equipment at home: O[employer] Blitz - 4 wheeled, Wheelchair (manual), shower chair, Tour manager, Grab bars, Ramped entry, and trekking pole  OCCUPATION: Retired  PLOF: Independent, prior to a year ago he was driving used trekking poles to walk  PATIENT GOALS: live at home  NEXT MD VISIT:   OBJECTIVE:   DIAGNOSTIC FINDINGS:    LUMBAR SPINE FINDINGS: 1. Wide laminectomy and posterior pedicle screw and rod fixation at L4-5 without residual or recurrent stenosis. 2. Progressive adjacent level disease  at L3-4 with moderate right and mild left subarticular narrowing and moderate foraminal stenosis bilaterally. Progression is predominantly due to facet disease 3. Progressive moderate facet hypertrophy at L1-2 with a progressive rightward disc protrusion resulting in progressive moderate right foraminal stenosis. Asymmetric endplate marrow edema on the right at L1-2 is consistent with progressive disease as well. 4. Otherwise stable degenerative change without other focal stenosis.   THORACIC SPINE FINDINGS:  On sagittal views the vertebral bodies have normal height and alignment.  Hemangioma within T9 vertebral body and slightly within T10 vertebral body.  Anterior kyphosis.  Mild scoliosis convex right centered at T8 level. The spinal cord is normal in size and appearance. The paraspinal soft tissues are unremarkable.     On axial views there is no spinal stenosis or foraminal narrowing.  Limited views of the aorta, kidneys, liver, lungs and paraspinal muscles are notable for multiple large right renal cysts ranging in size from 1.5 to 7 cm.   COGNITION: Overall cognitive status: Within functional limits for tasks assessed     SENSATION: WFL  MUSCLE LENGTH: Hamstrings: mod tightness on both sides  POSTURE: rounded shoulders and forward head  LOWER EXTREMITY ROM:   the right knee has HS, has no quad tendon on the right, unable to lift the leg, left leg WFL's , left hip and right hip and ankles WFL's   LOWER EXTREMITY MMT: 4-/5 for the LE's except for the right knee extension UPPER EXTREMITY MMT:  4-/5 for shoulders and elbows   FUNCTIONAL TESTS:  5XSTS: 83 seconds really has to use his hands and is unsteady with standing Cannot stand on his own without holding onto something due to poor balance   Transfers:  needs set up and MinA for safety, is impulsive and unsafe at times. GAIT: He is w/c dependent    TODAY'S TREATMENT:  DATE:  07/31/23 Nustep level 6 x 8 minutes then LE only level 5 x 4 minutes Leg curls45#  both legs 20# right only Leg extension 10# left only 15# biceps curls 15# on pulleys rows 15# extensions in w/c Supine feet on ball bridges, rotation, isometric abs Passive right knee extension Black tband clamshells  07/26/23 Nustep level 6 x 8 minutes, no arms level 5 x 3 minutes In Pbars with brace locked trying to do some weight shifts and weight bearing on the right leg with him lifting the left Passive stretch right HS Seated pulley 15# each side rows and straight arm pulls 10# each pulley seated chest press Feet on ball K2C, rotation, bridges, isometric abs Brace locked right leg SLR's Side lying abduction Supine passive right knee extension Supine black tband hip extension Education of the patient  07/24/23 Nustep level 6 x 8 minutes, no arms level 5 x 3 minutes Leg curls 45# 2x15, then right only 25# 2x10 Leg extension 20# left only x10, then 15# x10 Had a brace on the right knee could not get it right to try w/b on the right  Feet on ball bridges, rotation, isometric abs Brace locked SLR, hip abduction  07/21/23 Nustep level 6 x 8 minutes, LE only L5 x 3 min Seated in w/c 15# rows, straight arm pulls 15lb  biceps 10lb 2x15 10# each pulley chest press At Mat table  Seated Crunches    W/ OHP yellow wball  Seated trunk rotatons bluw ba//  Horiz abd blue 2x10  HS curls blue 2x15  07/19/23 Nustep level 6 x 7 minutes Seated in w/c 15# rows, straight arm pulls, 15# biceps 2x15 10# each pulley chest press 20# single leg curls 2x15 Supine feet on ball bridge, rotation, isometric abs Black tband clamshells Ball b/n knees squeeze with bridge Black tband hip extension  07/05/23 Nustep level 6 x 7 minutes, level 5 LE only x 5 minutes Biceps 15#  Rows 15# Extension 15# Feet on ball trunk rotation, bridges,  isometric abs Black tband clamshells in [employer] squeeze with bridge In Pbars high knee raises PT blocking the right knee  07/03/23 NuStep L6 x7 min, x  L5 4 min LE only Shoulder Ext in w/c 15lb  10# biceps curls 15# rows  Chest press 10 2x15 Fitter press 2 blue 1 black 2x15 Sit to stand for elevated surface to w/c HS curls blue 2x15   06/29/23 NuStep L6 x23 min, x 7 min LE only  15# in w/c straight arm pulls 15# biceps curls  06/27/23 NuStep L6 x6 min, x 4 min LE only  15# in w/c straight arm pulls 15# rows 15# biceps curls 10# chest press Overhead Triceps  ext 2x15 Chest flys 10lb 2x15 WC dips x5, x5, x10 Hs curls blue 2x10  05/2823 UBE L1 x 2 min each  NuStep L6 x4 min, x LE only  Seated at mat without Back support LAQ 5lb 2x15 Seated march 5lb 2x15   UE MMT grosley 4+/5  Blue ball  OHP 2x10  Seated trunk rotations blue ball 2x15  HS curls blue 2x15   06/20/23 NuStep L 5 x 7 min, x4 min LE only RLE hip abd and flex in // bars 2x10 15# in w/c straight arm pulls 15# rows 15# biceps curls 10# chest press At Mat table  Seated trunk rotations blue ball 2x15  Blue ball  OHP 2x10  Horiz abd blue 2x15  06/12/23 NuStep L 5  x 7 min, x3 min LE only At Mat table   STM w/ thea gun to L lateral thigh  Blue ball  OHP 2x15  HS Curl green LLE x10, RLE 2x10  Horiz abd blue 2x10  LAQ LLE 5lb  2x15  Seated trunk rotations blue ball 2x15   06/09/23 NuStep L 5 x 7 min, x4 min LE only 15# in w/c straight arm pulls 15# rows 10# chest press At Mat table   4lb OHP   HS Curle green 2x10  LAQ 5lb 2x10  Seated trunk rotations blue ball 3x10  Horiz shoulder Abd blue 2x10 PATIENT EDUCATION:  Education details: POC Person educated: Patient Education method: Explanation Education comprehension: verbalized understanding  HOME EXERCISE PROGRAM:  ASSESSMENT:  CLINICAL IMPRESSION: I tried to do a lot of education with the patient on the need to activate the hip  and show him that he has no ability to hold himself with the right knee if he does not activate the hip to lock it out, I have a lot of worries with him as he is so impulsive and most of the time is unsafe with transfers, we talked aobut if he does have a surgery (he is seeing surgeon in two weeks) that if he does not follow protocol he will tear it again.  Even with the brace on there is so much play that it will buckle quickly.  I did do some stretching to help get this straight Worked on core and hips, still very impulsive and unsafe   Patient is a 83 y.o. male who was seen today for physical therapy evaluation and treatment for repeated falls and balance issues, he has had 3 quadriceps tendon ruptures in the past year, his last rupture was not repaired so he has no active right knee extension.  He reports repeated falls, falling every few days. Pt I very impulsive and unsafe with transfers, he is no longer walking and is w/c dependent.   He will benefit from skilled PT to decrease his risk for falls and address his overall strength, function and safety   OBJECTIVE IMPAIRMENTS: Abnormal gait, cardiopulmonary status limiting activity, decreased activity tolerance, decreased balance, decreased coordination, decreased endurance, decreased mobility, difficulty walking, decreased ROM, decreased strength, improper body mechanics, postural dysfunction, and poor safety .   REHAB POTENTIAL: Fair dependent on carry over and patient compliance  CLINICAL DECISION MAKING: Evolving/moderate complexity  EVALUATION COMPLEXITY: Moderate  GOALS: Goals reviewed with patient? Yes  SHORT TERM GOALS: Target date: 05/29/23  Patient will be independent with initial HEP. Goal status: Met  05/10/23  LONG TERM GOALS: Target date: 07/29/23  Patient will be independent with advanced/ongoing HEP to improve outcomes and carryover.  Goal status:ongoing 07/31/23  2.  Understand the need for safety with his transfers and all  the steps to be as safe as possible Goal status: Ongoing 06/01/23  3.  Patient will demonstrate improved functional LE strength as demonstrated by < 25s on 5xSTS. Baseline: 83 seconds had to use hands and was unable to be independent with the standing Goal status: Progressing 07/31/23  4. Increase UE strength to 4+/5 for better transfers and function Goal status: Progressing  07/26/23  5.  Patient will report no falls in a 4 weeks period. Baseline: falls every few days Goal status: Ongoing 06/01/23 fall last "20 or 21" of Feburary, ongoing 07/31/23  PLAN:  PT FREQUENCY: 2x/week  PT DURATION: 12 weeks  PLANNED INTERVENTIONS: Therapeutic exercises, Therapeutic activity, Neuromuscular re-education,  Balance training, Gait training, Patient/Family education, Self Care, Joint mobilization, Stair training, Cryotherapy, Moist heat, and Manual therapy.  PLAN FOR NEXT SESSION: needs total body strength to help with his safety in transfers and independence. Try the brace and him activating glutes  Hollis Lurie, PT

## 2023-08-02 ENCOUNTER — Ambulatory Visit: Admitting: Physical Therapy

## 2023-08-02 ENCOUNTER — Encounter: Payer: Self-pay | Admitting: Physical Therapy

## 2023-08-02 DIAGNOSIS — R2689 Other abnormalities of gait and mobility: Secondary | ICD-10-CM | POA: Diagnosis not present

## 2023-08-02 DIAGNOSIS — R296 Repeated falls: Secondary | ICD-10-CM

## 2023-08-02 DIAGNOSIS — M6249 Contracture of muscle, multiple sites: Secondary | ICD-10-CM

## 2023-08-02 DIAGNOSIS — M6281 Muscle weakness (generalized): Secondary | ICD-10-CM

## 2023-08-02 NOTE — Therapy (Signed)
 OUTPATIENT PHYSICAL THERAPY TREATMENT      Patient Name: Brandon Mendoza. MRN: [id number] DOB:[birth year], 83 y.o., male Today's Date: 08/02/2023  END OF SESSION:  PT End of Session - 08/02/23 0845     Visit Number 25    Date for PT Re-Evaluation 08/31/23    PT Start Time 0845    PT Stop Time 0930    PT Time Calculation (min) 45 min    Activity Tolerance Patient tolerated treatment well    Behavior During Therapy [facility] for tasks assessed/performed;Impulsive             Past Medical History:  Diagnosis Date   Acquired clawfoot, right foot 12/16/2021   Anxiety    Atrophy of calf muscles 10/21/2021   Duplex calves was negative for significant blockage EMG was done at [facility] MRI lumbar spine was also done at [facility]   Carotid artery occlusion    CKD (chronic kidney disease)    Stage 3   COPD (chronic obstructive pulmonary disease) (HCC)    Depression    Dysrhythmia    PAF, atrial tachycardia   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   The spinal cord appears normal. 2.   No spinal stenosis. 3.   Mild multilevel degenerative changes as detailed above that do not lead to spinal stenosis or nerve root compression. 4.   T2 hyperintense foci within the pons consistent with chronic microvascular ischemic changes.   Loop - Medtronic Linq 10/22/2020 10/22/2020   Nocturia    Paroxysmal atrial fibrillation (HCC)    Peripheral neuropathy    Peripheral vascular disease (HCC)    Sleep apnea    on 6 cm, nasal pillow   Spinal stenosis    Stroke (HCC) 05/01/2011   ischemic   Weakness generalized 10/21/2021   Severe, legs and arms   Past Surgical History:  Procedure Laterality Date   CAROTID ENDARTERECTOMY  12/09/11   Left cea   ENDARTERECTOMY   12/09/2011   Procedure: ENDARTERECTOMY CAROTID;  Surgeon: Mayo Speck, MD;  Location: [facility] OR;  Service: Vascular;  Laterality: Left;   EYE SURGERY     rt retina detachment   HAMMER TOE SURGERY  2004   HERNIA REPAIR  2006   I & D EXTREMITY Right 10/07/2022   Procedure: IRRIGATION AND DEBRIDEMENT EXTREMITY WITH WOUND CLOSURE;  Surgeon: Mort Ards, MD;  Location: [facility] ORS;  Service: Orthopedics;  Laterality: Right;   QUADRICEPS TENDON REPAIR Right 07/12/2022   Procedure: REPAIR QUADRICEP TENDON;  Surgeon: Claiborne Crew, MD;  Location: [facility] ORS;  Service: Orthopedics;  Laterality: Right;   QUADRICEPS TENDON REPAIR Right 08/30/2022   Procedure: REPAIR QUADRICEP TENDON;  Surgeon: Claiborne Crew, MD;  Location: [facility] OR[facility];  Service: Orthopedics;  Laterality: Right;   SHOULDER ARTHROSCOPY W/ ROTATOR CUFF REPAIR  2010   TONSILLECTOMY     Patient Active Problem List   Diagnosis Date Noted   Coronary artery calcification 05/09/2023   Rupture of quadriceps tendon 11/03/2022   Wound dehiscence 10/07/2022   Quadriceps tendon rupture, right, subsequent encounter 08/30/2022   Quadriceps tendon rupture, right, initial encounter 07/12/2022   Unable to care for self 07/08/2022   Dehydration 07/08/2022   Depression  07/08/2022   COPD (chronic obstructive pulmonary disease) (HCC) 07/08/2022   Sialoadenitis of submandibular gland 04/28/2022   Macular degeneration 03/01/2022   Myofascial pain 02/10/2022   Lumbar spondylosis 01/03/2022   Acquired clawfoot, left foot 12/16/2021   Acquired clawfoot, right foot 12/16/2021   Claw foot 12/14/2021   Gait abnormality 12/10/2021   Spinal stenosis at L4-L5 level 12/10/2021   Ataxia 11/22/2021   Lightheadedness 11/02/2021   Claudication of lower extremity (HCC) 10/21/2021   Atrophy of calf muscles 10/21/2021   Weakness generalized 10/21/2021   Focal neurological deficit 10/21/2021   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   Pain in joint of  right hip 09/14/2021   High arches 07/22/2021   Hammertoes of both feet 07/22/2021   Neuropathy 07/22/2021   Constipation 05/28/2021   Disequilibrium 05/28/2021   History of colonic polyps 05/28/2021   Renal cyst 05/28/2021   Gastroesophageal reflux disease without esophagitis 05/05/2021   Ingrowing left great toenail 02/15/2021   S/P lumbar fusion 12/24/2020   Spinal stenosis of lumbar region 12/04/2020   Acquired complex renal cyst 11/23/2020   Functional gait abnormality 11/04/2020   Idiopathic neuropathy 11/02/2020   Tinea pedis of both feet 10/28/2020   Loop - Medtronic Linq 10/22/2020 10/22/2020   Benign hypertension with 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) 10/05/2020   Fall with injury 10/05/2020   Hematoma 09/16/2020   Normocytic anemia 09/14/2020   Leukocytosis 09/14/2020   AF (paroxysmal atrial fibrillation) (HCC) 09/14/2020   Chronic anticoagulation 09/14/2020   Hyponatremia 09/14/2020   Pain in left foot 01/22/2020   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   Pain in joint of right knee 12/27/2019   Recurrent falls 11/17/2019   Paroxysmal atrial fibrillation (HCC) 10/22/2019   Pain in right foot 10/14/2019   Falls 10/07/2019   Ataxia involving legs 08/12/2019   OSA on CPAP 06/20/2018   Urge incontinence 04/13/2018   Urinary urgency 04/13/2018   Pain in left knee 03/01/2018   Other intervertebral disc degeneration, lumbar region 06/16/2017   Acquired bilateral hammer toes 10/05/2015   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   Generalized anxiety disorder 05/12/2015   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   History of left-sided carotid endarterectomy 04/10/2014   Essential hypertension 04/10/2014   Occlusion and stenosis of carotid artery without mention of cerebral infarction 12/15/2011   Habitual alcohol use 12/01/2011   Carotid artery stenosis,  symptomatic 11/30/2011   Cerebral infarction (HCC) 11/29/2011   Mixed hyperlipidemia     PCP: Scherrie Curt  REFERRING PROVIDER: Scherrie Curt  REFERRING DIAG: weakness, multiple falls  Rationale for Evaluation and Treatment: Rehabilitation  THERAPY DIAG:  Other abnormalities of gait and mobility  Muscle weakness (generalized)  Contracture of muscle, multiple sites  Repeated falls  ONSET DATE: 10/07/22  SUBJECTIVE:  SUBJECTIVE STATEMENT: Doing all right, had one fall, fell in the shower yesterday. Did exercises before the showers, legs gave out  on him  PERTINENT HISTORY:  Cerebral infarct 10/21/21, lumbar fusion, recurrent falls, has significant neuropathy and this is the reason for most of his falls   PAIN:  Are you having pain? Yes: NPRS scale: 0/10 Pain location: right knee Pain description: ache  Aggravating factors: being on my feet  Relieving factors: movement  PRECAUTIONS: Fall  WEIGHT BEARING RESTRICTIONS: No  FALLS:  Has patient fallen in last 6 months? Yes. Number of falls every few days   LIVING ENVIRONMENT: Lives with: lives with their spouse Lives in: House/apartment Stairs: Yes: Internal: 20 steps; on right going up Has following equipment at home: O[employer] Blitz - 4 wheeled, Wheelchair (manual), shower chair, Tour manager, Grab bars, Ramped entry, and trekking pole  OCCUPATION: Retired  PLOF: Independent, prior to a year ago he was driving used trekking poles to walk  PATIENT GOALS: live at home  NEXT MD VISIT:   OBJECTIVE:   DIAGNOSTIC FINDINGS:    LUMBAR SPINE FINDINGS: 1. Wide laminectomy and posterior pedicle screw and rod fixation at L4-5 without residual or recurrent stenosis. 2. Progressive adjacent level disease at L3-4 with moderate right and mild left  subarticular narrowing and moderate foraminal stenosis bilaterally. Progression is predominantly due to facet disease 3. Progressive moderate facet hypertrophy at L1-2 with a progressive rightward disc protrusion resulting in progressive moderate right foraminal stenosis. Asymmetric endplate marrow edema on the right at L1-2 is consistent with progressive disease as well. 4. Otherwise stable degenerative change without other focal stenosis.   THORACIC SPINE FINDINGS:  On sagittal views the vertebral bodies have normal height and alignment.  Hemangioma within T9 vertebral body and slightly within T10 vertebral body.  Anterior kyphosis.  Mild scoliosis convex right centered at T8 level. The spinal cord is normal in size and appearance. The paraspinal soft tissues are unremarkable.     On axial views there is no spinal stenosis or foraminal narrowing.  Limited views of the aorta, kidneys, liver, lungs and paraspinal muscles are notable for multiple large right renal cysts ranging in size from 1.5 to 7 cm.   COGNITION: Overall cognitive status: Within functional limits for tasks assessed     SENSATION: WFL  MUSCLE LENGTH: Hamstrings: mod tightness on both sides  POSTURE: rounded shoulders and forward head  LOWER EXTREMITY ROM:   the right knee has HS, has no quad tendon on the right, unable to lift the leg, left leg WFL's , left hip and right hip and ankles WFL's   LOWER EXTREMITY MMT: 4-/5 for the LE's except for the right knee extension UPPER EXTREMITY MMT:  4-/5 for shoulders and elbows   FUNCTIONAL TESTS:  5XSTS: 83 seconds really has to use his hands and is unsteady with standing Cannot stand on his own without holding onto something due to poor balance   Transfers:  needs set up and MinA for safety, is impulsive and unsafe at times. GAIT: He is w/c dependent    TODAY'S TREATMENT:  DATE:  08/02/23 Nustep level 6 x 8 minutes then LE only level 5 x 4 minutes Leg curls45#  both legs 20# right only Leg extension 15# left only Fittetr press 2 blue 2x15 15# shoulder ext. 2x15 15# rows 2x15 10# bicep curls 2x15 10 # chest press 2x15 5# shoulder press 3x10  07/31/23 Nustep level 6 x 8 minutes then LE only level 5 x 4 minutes Leg curls45#  both legs 20# right only Leg extension 10# left only 15# biceps curls 15# on pulleys rows 15# extensions in w/c Supine feet on ball bridges, rotation, isometric abs Passive right knee extension Black tband clamshells  07/26/23 Nustep level 6 x 8 minutes, no arms level 5 x 3 minutes In Pbars with brace locked trying to do some weight shifts and weight bearing on the right leg with him lifting the left Passive stretch right HS Seated pulley 15# each side rows and straight arm pulls 10# each pulley seated chest press Feet on ball K2C, rotation, bridges, isometric abs Brace locked right leg SLR's Side lying abduction Supine passive right knee extension Supine black tband hip extension Education of the patient  07/24/23 Nustep level 6 x 8 minutes, no arms level 5 x 3 minutes Leg curls 45# 2x15, then right only 25# 2x10 Leg extension 20# left only x10, then 15# x10 Had a brace on the right knee could not get it right to try w/b on the right  Feet on ball bridges, rotation, isometric abs Brace locked SLR, hip abduction  07/21/23 Nustep level 6 x 8 minutes, LE only L5 x 3 min Seated in w/c 15# rows, straight arm pulls 15lb  biceps 10lb 2x15 10# each pulley chest press At Mat table  Seated Crunches    W/ OHP yellow wball  Seated trunk rotatons bluw ba//  Horiz abd blue 2x10  HS curls blue 2x15  07/19/23 Nustep level 6 x 7 minutes Seated in w/c 15# rows, straight arm pulls, 15# biceps 2x15 10# each pulley chest press 20# single leg curls 2x15 Supine feet on ball bridge, rotation, isometric  abs Black tband clamshells Ball b/n knees squeeze with bridge Black tband hip extension  07/05/23 Nustep level 6 x 7 minutes, level 5 LE only x 5 minutes Biceps 15#  Rows 15# Extension 15# Feet on ball trunk rotation, bridges, isometric abs Black tband clamshells in [employer] squeeze with bridge In Pbars high knee raises PT blocking the right knee  07/03/23 NuStep L6 x7 min, x  L5 4 min LE only Shoulder Ext in w/c 15lb  10# biceps curls 15# rows  Chest press 10 2x15 Fitter press 2 blue 1 black 2x15 Sit to stand for elevated surface to w/c HS curls blue 2x15   06/29/23 NuStep L6 x23 min, x 7 min LE only  15# in w/c straight arm pulls 15# biceps curls  06/27/23 NuStep L6 x6 min, x 4 min LE only  15# in w/c straight arm pulls 15# rows 15# biceps curls 10# chest press Overhead Triceps  ext 2x15 Chest flys 10lb 2x15 WC dips x5, x5, x10 Hs curls blue 2x10  05/2823 UBE L1 x 2 min each  NuStep L6 x4 min, x LE only  Seated at mat without Back support LAQ 5lb 2x15 Seated march 5lb 2x15   UE MMT grosley 4+/5  Blue ball  OHP 2x10  Seated trunk rotations blue ball 2x15  HS curls blue 2x15   06/20/23 NuStep L 5 x 7  min, x4 min LE only RLE hip abd and flex in // bars 2x10 15# in w/c straight arm pulls 15# rows 15# biceps curls 10# chest press At Mat table  Seated trunk rotations blue ball 2x15  Blue ball  OHP 2x10  Horiz abd blue 2x15  06/12/23 NuStep L 5 x 7 min, x3 min LE only At Mat table   STM w/ thea gun to L lateral thigh  Blue ball  OHP 2x15  HS Curl green LLE x10, RLE 2x10  Horiz abd blue 2x10  LAQ LLE 5lb  2x15  Seated trunk rotations blue ball 2x15   06/09/23 NuStep L 5 x 7 min, x4 min LE only 15# in w/c straight arm pulls 15# rows 10# chest press At Mat table   4lb OHP   HS Curle green 2x10  LAQ 5lb 2x10  Seated trunk rotations blue ball 3x10  Horiz shoulder Abd blue 2x10 PATIENT EDUCATION:  Education details: POC Person educated:  Patient Education method: Explanation Education comprehension: verbalized understanding  HOME EXERCISE PROGRAM:  ASSESSMENT:  CLINICAL IMPRESSION: Pt enters doing well. Added back fitter press to assess carryover form previous treatment with hip activation. I have a lot of worries with him as he is so impulsive and most of the time is unsafe with transfers. Unable to try standing activities even with the brace on there is so much play that it will buckle quickly.  UE weakness and fatigue with shoulder press.  Patient is a 83 y.o. male who was seen today for physical therapy evaluation and treatment for repeated falls and balance issues, he has had 3 quadriceps tendon ruptures in the past year, his last rupture was not repaired so he has no active right knee extension.  He reports repeated falls, falling every few days. Pt I very impulsive and unsafe with transfers, he is no longer walking and is w/c dependent.   He will benefit from skilled PT to decrease his risk for falls and address his overall strength, function and safety   OBJECTIVE IMPAIRMENTS: Abnormal gait, cardiopulmonary status limiting activity, decreased activity tolerance, decreased balance, decreased coordination, decreased endurance, decreased mobility, difficulty walking, decreased ROM, decreased strength, improper body mechanics, postural dysfunction, and poor safety .   REHAB POTENTIAL: Fair dependent on carry over and patient compliance  CLINICAL DECISION MAKING: Evolving/moderate complexity  EVALUATION COMPLEXITY: Moderate  GOALS: Goals reviewed with patient? Yes  SHORT TERM GOALS: Target date: 05/29/23  Patient will be independent with initial HEP. Goal status: Met  05/10/23  LONG TERM GOALS: Target date: 07/29/23  Patient will be independent with advanced/ongoing HEP to improve outcomes and carryover.  Goal status:ongoing 07/31/23  2.  Understand the need for safety with his transfers and all the steps to be as safe  as possible Goal status: Ongoing 06/01/23  3.  Patient will demonstrate improved functional LE strength as demonstrated by < 25s on 5xSTS. Baseline: 83 seconds had to use hands and was unable to be independent with the standing Goal status: Progressing 07/31/23  4. Increase UE strength to 4+/5 for better transfers and function Goal status: Progressing  07/26/23  5.  Patient will report no falls in a 4 weeks period. Baseline: falls every few days Goal status: Ongoing 06/01/23 fall last "20 or 21" of Feburary, ongoing 07/31/23  PLAN:  PT FREQUENCY: 2x/week  PT DURATION: 12 weeks  PLANNED INTERVENTIONS: Therapeutic exercises, Therapeutic activity, Neuromuscular re-education, Balance training, Gait training, Patient/Family education, Self Care, Joint mobilization, Stair  training, Cryotherapy, Moist heat, and Manual therapy.  PLAN FOR NEXT SESSION: needs total body strength to help with his safety in transfers and independence. Try the brace and him activating glutes  Ollen Beverage, PTA

## 2023-08-07 ENCOUNTER — Encounter: Payer: Self-pay | Admitting: Physical Therapy

## 2023-08-07 ENCOUNTER — Ambulatory Visit: Admitting: Physical Therapy

## 2023-08-07 ENCOUNTER — Ambulatory Visit (INDEPENDENT_AMBULATORY_CARE_PROVIDER_SITE_OTHER): Payer: Medicare Other

## 2023-08-07 DIAGNOSIS — R2689 Other abnormalities of gait and mobility: Secondary | ICD-10-CM

## 2023-08-07 DIAGNOSIS — I480 Paroxysmal atrial fibrillation: Secondary | ICD-10-CM | POA: Diagnosis not present

## 2023-08-07 DIAGNOSIS — M6249 Contracture of muscle, multiple sites: Secondary | ICD-10-CM

## 2023-08-07 DIAGNOSIS — M25561 Pain in right knee: Secondary | ICD-10-CM

## 2023-08-07 DIAGNOSIS — M6281 Muscle weakness (generalized): Secondary | ICD-10-CM

## 2023-08-07 DIAGNOSIS — R296 Repeated falls: Secondary | ICD-10-CM

## 2023-08-07 LAB — CUP PACEART REMOTE DEVICE CHECK
Date Time Interrogation Session: 20250511233837
Implantable Pulse Generator Implant Date: 20220728

## 2023-08-07 NOTE — Therapy (Signed)
 OUTPATIENT PHYSICAL THERAPY TREATMENT      Patient Name: Brandon Mendoza. MRN: [id number] DOB:Mar 30, 1940, 83 y.o., male Today's Date: 08/07/2023  END OF SESSION:  PT End of Session - 08/07/23 0848     Visit Number 26    Date for PT Re-Evaluation 08/31/23    Authorization Type Medicare    PT Start Time 0843    PT Stop Time 0930    PT Time Calculation (min) 47 min    Activity Tolerance Patient tolerated treatment well    Behavior During Therapy [facility] for tasks assessed/performed;Impulsive             Past Medical History:  Diagnosis Date   Acquired clawfoot, right foot 12/16/2021   Anxiety    Atrophy of calf muscles 10/21/2021   Duplex calves was negative for significant blockage EMG was done at [facility] MRI lumbar spine was also done at [facility]   Carotid artery occlusion    CKD (chronic kidney disease)    Stage 3   COPD (chronic obstructive pulmonary disease) (HCC)    Depression    Dysrhythmia    PAF, atrial tachycardia   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   The spinal cord appears normal. 2.   No spinal stenosis. 3.   Mild multilevel degenerative changes as detailed above that do not lead to spinal stenosis or nerve root compression. 4.   T2 hyperintense foci within the pons consistent with chronic microvascular ischemic changes.   Loop - Medtronic Linq 10/22/2020 10/22/2020   Nocturia    Paroxysmal atrial fibrillation (HCC)    Peripheral neuropathy    Peripheral vascular disease (HCC)    Sleep apnea    on 6 cm, nasal pillow   Spinal stenosis    Stroke (HCC) 05/01/2011   ischemic   Weakness generalized 10/21/2021   Severe, legs and arms   Past Surgical History:  Procedure Laterality Date   CAROTID ENDARTERECTOMY  12/09/11    Left cea   ENDARTERECTOMY  12/09/2011   Procedure: ENDARTERECTOMY CAROTID;  Surgeon: Mayo Speck, MD;  Location: [facility] OR;  Service: Vascular;  Laterality: Left;   EYE SURGERY     rt retina detachment   HAMMER TOE SURGERY  2004   HERNIA REPAIR  2006   I & D EXTREMITY Right 10/07/2022   Procedure: IRRIGATION AND DEBRIDEMENT EXTREMITY WITH WOUND CLOSURE;  Surgeon: Mort Ards, MD;  Location: [facility] ORS;  Service: Orthopedics;  Laterality: Right;   QUADRICEPS TENDON REPAIR Right 07/12/2022   Procedure: REPAIR QUADRICEP TENDON;  Surgeon: Claiborne Crew, MD;  Location: [facility] ORS;  Service: Orthopedics;  Laterality: Right;   QUADRICEPS TENDON REPAIR Right 08/30/2022   Procedure: REPAIR QUADRICEP TENDON;  Surgeon: Claiborne Crew, MD;  Location: [facility] ORS;  Service: Orthopedics;  Laterality: Right;   SHOULDER ARTHROSCOPY W/ ROTATOR CUFF REPAIR  2010   TONSILLECTOMY     Patient Active Problem List   Diagnosis Date Noted   Coronary artery calcification 05/09/2023   Rupture of quadriceps tendon 11/03/2022   Wound dehiscence 10/07/2022   Quadriceps tendon rupture, right, subsequent encounter 08/30/2022   Quadriceps tendon rupture, right, initial encounter 07/12/2022   Unable to care for self 07/08/2022  Dehydration 07/08/2022   Depression 07/08/2022   COPD (chronic obstructive pulmonary disease) (HCC) 07/08/2022   Sialoadenitis of submandibular gland 04/28/2022   Macular degeneration 03/01/2022   Myofascial pain 02/10/2022   Lumbar spondylosis 01/03/2022   Acquired clawfoot, left foot 12/16/2021   Acquired clawfoot, right foot 12/16/2021   Claw foot 12/14/2021   Gait abnormality 12/10/2021   Spinal stenosis at L4-L5 level 12/10/2021   Ataxia 11/22/2021   Lightheadedness 11/02/2021   Claudication of lower extremity (HCC) 10/21/2021   Atrophy of calf muscles 10/21/2021   Weakness generalized 10/21/2021   Focal neurological deficit 10/21/2021   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   Memory impairment of gradual onset  10/21/2021   Pain in joint of right hip 09/14/2021   High arches 07/22/2021   Hammertoes of both feet 07/22/2021   Neuropathy 07/22/2021   Constipation 05/28/2021   Disequilibrium 05/28/2021   History of colonic polyps 05/28/2021   Renal cyst 05/28/2021   Gastroesophageal reflux disease without esophagitis 05/05/2021   Ingrowing left great toenail 02/15/2021   S/P lumbar fusion 12/24/2020   Spinal stenosis of lumbar region 12/04/2020   Acquired complex renal cyst 11/23/2020   Functional gait abnormality 11/04/2020   Idiopathic neuropathy 11/02/2020   Tinea pedis of both feet 10/28/2020   Loop - Medtronic Linq 10/22/2020 10/22/2020   Benign hypertension with 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) 10/05/2020   Fall with injury 10/05/2020   Hematoma 09/16/2020   Normocytic anemia 09/14/2020   Leukocytosis 09/14/2020   AF (paroxysmal atrial fibrillation) (HCC) 09/14/2020   Chronic anticoagulation 09/14/2020   Hyponatremia 09/14/2020   Pain in left foot 01/22/2020   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   Pain in joint of right knee 12/27/2019   Recurrent falls 11/17/2019   Paroxysmal atrial fibrillation (HCC) 10/22/2019   Pain in right foot 10/14/2019   Falls 10/07/2019   Ataxia involving legs 08/12/2019   OSA on CPAP 06/20/2018   Urge incontinence 04/13/2018   Urinary urgency 04/13/2018   Pain in left knee 03/01/2018   Other intervertebral disc degeneration, lumbar region 06/16/2017   Acquired bilateral hammer toes 10/05/2015   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   Generalized anxiety disorder 05/12/2015   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   History of left-sided carotid endarterectomy 04/10/2014   Essential hypertension 04/10/2014   Occlusion and stenosis of carotid artery without mention of cerebral infarction 12/15/2011   Habitual alcohol use  12/01/2011   Carotid artery stenosis, symptomatic 11/30/2011   Cerebral infarction (HCC) 11/29/2011   Mixed hyperlipidemia     PCP: Scherrie Curt  REFERRING PROVIDER: Scherrie Curt  REFERRING DIAG: weakness, multiple falls  Rationale for Evaluation and Treatment: Rehabilitation  THERAPY DIAG:  Other abnormalities of gait and mobility  Muscle weakness (generalized)  Repeated falls  Contracture of muscle, multiple sites  Acute pain of right knee  ONSET DATE: 10/07/22  SUBJECTIVE:  SUBJECTIVE STATEMENT: Denies falls over the weekend  Doing all right, had one fall, fell in the shower yesterday. Did exercises before the showers, legs gave out  on him  PERTINENT HISTORY:  Cerebral infarct 10/21/21, lumbar fusion, recurrent falls, has significant neuropathy and this is the reason for most of his falls   PAIN:  Are you having pain? Yes: NPRS scale: 0/10 Pain location: right knee Pain description: ache  Aggravating factors: being on my feet  Relieving factors: movement  PRECAUTIONS: Fall  WEIGHT BEARING RESTRICTIONS: No  FALLS:  Has patient fallen in last 6 months? Yes. Number of falls every few days   LIVING ENVIRONMENT: Lives with: lives with their spouse Lives in: House/apartment Stairs: Yes: Internal: 20 steps; on right going up Has following equipment at home: [employer] Blitz - 4 wheeled, Wheelchair (manual), shower chair, Tour manager, Grab bars, Ramped entry, and trekking pole  OCCUPATION: Retired  PLOF: Independent, prior to a year ago he was driving used trekking poles to walk  PATIENT GOALS: live at home  NEXT MD VISIT:   OBJECTIVE:   DIAGNOSTIC FINDINGS:    LUMBAR SPINE FINDINGS: 1. Wide laminectomy and posterior pedicle screw and rod fixation at L4-5 without residual or  recurrent stenosis. 2. Progressive adjacent level disease at L3-4 with moderate right and mild left subarticular narrowing and moderate foraminal stenosis bilaterally. Progression is predominantly due to facet disease 3. Progressive moderate facet hypertrophy at L1-2 with a progressive rightward disc protrusion resulting in progressive moderate right foraminal stenosis. Asymmetric endplate marrow edema on the right at L1-2 is consistent with progressive disease as well. 4. Otherwise stable degenerative change without other focal stenosis.   THORACIC SPINE FINDINGS:  On sagittal views the vertebral bodies have normal height and alignment.  Hemangioma within T9 vertebral body and slightly within T10 vertebral body.  Anterior kyphosis.  Mild scoliosis convex right centered at T8 level. The spinal cord is normal in size and appearance. The paraspinal soft tissues are unremarkable.     On axial views there is no spinal stenosis or foraminal narrowing.  Limited views of the aorta, kidneys, liver, lungs and paraspinal muscles are notable for multiple large right renal cysts ranging in size from 1.5 to 7 cm.   COGNITION: Overall cognitive status: Within functional limits for tasks assessed     SENSATION: WFL  MUSCLE LENGTH: Hamstrings: mod tightness on both sides  POSTURE: rounded shoulders and forward head  LOWER EXTREMITY ROM:   the right knee has HS, has no quad tendon on the right, unable to lift the leg, left leg WFL's , left hip and right hip and ankles WFL's   LOWER EXTREMITY MMT: 4-/5 for the LE's except for the right knee extension UPPER EXTREMITY MMT:  4-/5 for shoulders and elbows   FUNCTIONAL TESTS:  5XSTS: 83 seconds really has to use his hands and is unsteady with standing Cannot stand on his own without holding onto something due to poor balance   Transfers:  needs set up and MinA for safety, is impulsive and unsafe at times. GAIT: He is w/c dependent    TODAY'S  TREATMENT:  DATE:  08/07/23 Nustep level 6 x 8 minutes, LE only level 5 x 4 minutes Leg curls 45# 2x15, then right only 20# 2x15 15# rows 15# extensions 10# chest press 20# biceps Supine feet on ball K2C, rotation, bridges, isometric abs Passive right knee extension Supine black tband hip extension bilaterally 2x15  08/02/23 Nustep level 6 x 8 minutes then LE only level 5 x 4 minutes Leg curls45#  both legs 20# right only Leg extension 15# left only Fittetr press 2 blue 2x15 15# shoulder ext. 2x15 15# rows 2x15 10# bicep curls 2x15 10 # chest press 2x15 5# shoulder press 3x10  07/31/23 Nustep level 6 x 8 minutes then LE only level 5 x 4 minutes Leg curls45#  both legs 20# right only Leg extension 10# left only 15# biceps curls 15# on pulleys rows 15# extensions in w/c Supine feet on ball bridges, rotation, isometric abs Passive right knee extension Black tband clamshells  07/26/23 Nustep level 6 x 8 minutes, no arms level 5 x 3 minutes In Pbars with brace locked trying to do some weight shifts and weight bearing on the right leg with him lifting the left Passive stretch right HS Seated pulley 15# each side rows and straight arm pulls 10# each pulley seated chest press Feet on ball K2C, rotation, bridges, isometric abs Brace locked right leg SLR's Side lying abduction Supine passive right knee extension Supine black tband hip extension Education of the patient  07/24/23 Nustep level 6 x 8 minutes, no arms level 5 x 3 minutes Leg curls 45# 2x15, then right only 25# 2x10 Leg extension 20# left only x10, then 15# x10 Had a brace on the right knee could not get it right to try w/b on the right  Feet on ball bridges, rotation, isometric abs Brace locked SLR, hip abduction  07/21/23 Nustep level 6 x 8 minutes, LE only L5 x 3 min Seated in w/c 15# rows,  straight arm pulls 15lb  biceps 10lb 2x15 10# each pulley chest press At Mat table  Seated Crunches    W/ OHP yellow wball  Seated trunk rotatons bluw ba//  Horiz abd blue 2x10  HS curls blue 2x15  07/19/23 Nustep level 6 x 7 minutes Seated in w/c 15# rows, straight arm pulls, 15# biceps 2x15 10# each pulley chest press 20# single leg curls 2x15 Supine feet on ball bridge, rotation, isometric abs Black tband clamshells Ball b/n knees squeeze with bridge Black tband hip extension  07/05/23 Nustep level 6 x 7 minutes, level 5 LE only x 5 minutes Biceps 15#  Rows 15# Extension 15# Feet on ball trunk rotation, bridges, isometric abs Black tband clamshells in [employer] squeeze with bridge In Pbars high knee raises PT blocking the right knee  07/03/23 NuStep L6 x7 min, x  L5 4 min LE only Shoulder Ext in w/c 15lb  10# biceps curls 15# rows  Chest press 10 2x15 Fitter press 2 blue 1 black 2x15 Sit to stand for elevated surface to w/c HS curls blue 2x15   06/29/23 NuStep L6 x23 min, x 7 min LE only  15# in w/c straight arm pulls 15# biceps curls  06/27/23 NuStep L6 x6 min, x 4 min LE only  15# in w/c straight arm pulls 15# rows 15# biceps curls 10# chest press Overhead Triceps  ext 2x15 Chest flys 10lb 2x15 WC dips x5, x5, x10 Hs curls blue 2x10  05/2823 UBE L1 x 2 min each  NuStep  L6 x4 min, x LE only  Seated at mat without Back support LAQ 5lb 2x15 Seated march 5lb 2x15   UE MMT grosley 4+/5  Blue ball  OHP 2x10  Seated trunk rotations blue ball 2x15  HS curls blue 2x15   06/20/23 NuStep L 5 x 7 min, x4 min LE only RLE hip abd and flex in // bars 2x10 15# in w/c straight arm pulls 15# rows 15# biceps curls 10# chest press At Mat table  Seated trunk rotations blue ball 2x15  Blue ball  OHP 2x10  Horiz abd blue 2x15  06/12/23 NuStep L 5 x 7 min, x3 min LE only At Mat table   STM w/ thea gun to L lateral thigh  Blue ball  OHP 2x15  HS Curl  green LLE x10, RLE 2x10  Horiz abd blue 2x10  LAQ LLE 5lb  2x15  Seated trunk rotations blue ball 2x15 PATIENT EDUCATION:  Education details: POC Person educated: Patient Education method: Explanation Education comprehension: verbalized understanding  HOME EXERCISE PROGRAM:  ASSESSMENT:  CLINICAL IMPRESSION: Pt enters doing well. He has an MRI this week and then will see a surgeon next week regarding the results and possible options.  I have a lot of worries with him as he is so impulsive and most of the time is unsafe with transfers. Unable to try standing activities even with the brace on there is so much play that it will buckle quickly.  UE weakness and fatigue with shoulder press.  He did have a fall last week, reports that his legs were tired and did not hold him when transferring   Patient is a 83 y.o. male who was seen today for physical therapy evaluation and treatment for repeated falls and balance issues, he has had 3 quadriceps tendon ruptures in the past year, his last rupture was not repaired so he has no active right knee extension.  He reports repeated falls, falling every few days. Pt I very impulsive and unsafe with transfers, he is no longer walking and is w/c dependent.   He will benefit from skilled PT to decrease his risk for falls and address his overall strength, function and safety   OBJECTIVE IMPAIRMENTS: Abnormal gait, cardiopulmonary status limiting activity, decreased activity tolerance, decreased balance, decreased coordination, decreased endurance, decreased mobility, difficulty walking, decreased ROM, decreased strength, improper body mechanics, postural dysfunction, and poor safety.   REHAB POTENTIAL: Fair dependent on carry over and patient compliance  CLINICAL DECISION MAKING: Evolving/moderate complexity  EVALUATION COMPLEXITY: Moderate  GOALS: Goals reviewed with patient? Yes  SHORT TERM GOALS: Target date: 05/29/23  Patient will be independent with  initial HEP. Goal status: Met  05/10/23  LONG TERM GOALS: Target date: 07/29/23  Patient will be independent with advanced/ongoing HEP to improve outcomes and carryover.  Goal status:ongoing 07/31/23  2.  Understand the need for safety with his transfers and all the steps to be as safe as possible Goal status: Ongoing 06/01/23  3.  Patient will demonstrate improved functional LE strength as demonstrated by < 25s on 5xSTS. Baseline: 83 seconds had to use hands and was unable to be independent with the standing Goal status: Progressing 07/31/23  4. Increase UE strength to 4+/5 for better transfers and function Goal status: Progressing  07/26/23  5.  Patient will report no falls in a 4 weeks period. Baseline: falls every few days Goal status: Ongoing 06/01/23 fall last "20 or 21" of Feburary, ongoing 07/31/23  PLAN:  PT FREQUENCY: 2x/week  PT DURATION: 12 weeks  PLANNED INTERVENTIONS: Therapeutic exercises, Therapeutic activity, Neuromuscular re-education, Balance training, Gait training, Patient/Family education, Self Care, Joint mobilization, Stair training, Cryotherapy, Moist heat, and Manual therapy.  PLAN FOR NEXT SESSION: needs total body strength to help with his safety in transfers and independence. Try the brace and him activating glutes  Hollis Lurie, PT

## 2023-08-08 ENCOUNTER — Ambulatory Visit (INDEPENDENT_AMBULATORY_CARE_PROVIDER_SITE_OTHER): Admitting: Neurology

## 2023-08-08 DIAGNOSIS — I480 Paroxysmal atrial fibrillation: Secondary | ICD-10-CM

## 2023-08-08 DIAGNOSIS — G4739 Other sleep apnea: Secondary | ICD-10-CM

## 2023-08-08 DIAGNOSIS — G4733 Obstructive sleep apnea (adult) (pediatric): Secondary | ICD-10-CM | POA: Diagnosis not present

## 2023-08-08 DIAGNOSIS — I63131 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: Secondary | ICD-10-CM

## 2023-08-08 DIAGNOSIS — Z9889 Other specified postprocedural states: Secondary | ICD-10-CM

## 2023-08-09 ENCOUNTER — Ambulatory Visit: Payer: Self-pay | Admitting: Cardiology

## 2023-08-10 ENCOUNTER — Encounter: Payer: Self-pay | Admitting: Physical Therapy

## 2023-08-10 ENCOUNTER — Ambulatory Visit: Admitting: Physical Therapy

## 2023-08-10 DIAGNOSIS — R2689 Other abnormalities of gait and mobility: Secondary | ICD-10-CM | POA: Diagnosis not present

## 2023-08-10 DIAGNOSIS — R296 Repeated falls: Secondary | ICD-10-CM

## 2023-08-10 DIAGNOSIS — M6249 Contracture of muscle, multiple sites: Secondary | ICD-10-CM

## 2023-08-10 DIAGNOSIS — M6281 Muscle weakness (generalized): Secondary | ICD-10-CM

## 2023-08-10 NOTE — Therapy (Signed)
 OUTPATIENT PHYSICAL THERAPY TREATMENT      Patient Name: Brandon Mendoza. MRN: [id number] DOB:October 16, 1940, 83 y.o., male Today's Date: 08/10/2023  END OF SESSION:  PT End of Session - 08/10/23 0845     Visit Number 27    Date for PT Re-Evaluation 08/31/23    PT Start Time 0845    PT Stop Time 0930    PT Time Calculation (min) 45 min    Activity Tolerance Patient tolerated treatment well    Behavior During Therapy [facility] for tasks assessed/performed;Impulsive             Past Medical History:  Diagnosis Date   Acquired clawfoot, right foot 12/16/2021   Anxiety    Atrophy of calf muscles 10/21/2021   Duplex calves was negative for significant blockage EMG was done at [facility] MRI lumbar spine was also done at [facility]   Carotid artery occlusion    CKD (chronic kidney disease)    Stage 3   COPD (chronic obstructive pulmonary disease) (HCC)    Depression    Dysrhythmia    PAF, atrial tachycardia   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   The spinal cord appears normal. 2.   No spinal stenosis. 3.   Mild multilevel degenerative changes as detailed above that do not lead to spinal stenosis or nerve root compression. 4.   T2 hyperintense foci within the pons consistent with chronic microvascular ischemic changes.   Loop - Medtronic Linq 10/22/2020 10/22/2020   Nocturia    Paroxysmal atrial fibrillation (HCC)    Peripheral neuropathy    Peripheral vascular disease (HCC)    Sleep apnea    on 6 cm, nasal pillow   Spinal stenosis    Stroke (HCC) 05/01/2011   ischemic   Weakness generalized 10/21/2021   Severe, legs and arms   Past Surgical History:  Procedure Laterality Date   CAROTID ENDARTERECTOMY  12/09/11   Left cea   ENDARTERECTOMY   12/09/2011   Procedure: ENDARTERECTOMY CAROTID;  Surgeon: Mayo Speck, MD;  Location: [facility] OR;  Service: Vascular;  Laterality: Left;   EYE SURGERY     rt retina detachment   HAMMER TOE SURGERY  2004   HERNIA REPAIR  2006   I & D EXTREMITY Right 10/07/2022   Procedure: IRRIGATION AND DEBRIDEMENT EXTREMITY WITH WOUND CLOSURE;  Surgeon: Mort Ards, MD;  Location: [facility] ORS;  Service: Orthopedics;  Laterality: Right;   QUADRICEPS TENDON REPAIR Right 07/12/2022   Procedure: REPAIR QUADRICEP TENDON;  Surgeon: Claiborne Crew, MD;  Location: [facility] ORS;  Service: Orthopedics;  Laterality: Right;   QUADRICEPS TENDON REPAIR Right 08/30/2022   Procedure: REPAIR QUADRICEP TENDON;  Surgeon: Claiborne Crew, MD;  Location: [facility] ORS;  Service: Orthopedics;  Laterality: Right;   SHOULDER ARTHROSCOPY W/ ROTATOR CUFF REPAIR  2010   TONSILLECTOMY     Patient Active Problem List   Diagnosis Date Noted   Coronary artery calcification 05/09/2023   Rupture of quadriceps tendon 11/03/2022   Wound dehiscence 10/07/2022   Quadriceps tendon rupture, right, subsequent encounter 08/30/2022   Quadriceps tendon rupture, right, initial encounter 07/12/2022   Unable to care for self 07/08/2022   Dehydration 07/08/2022   Depression  07/08/2022   COPD (chronic obstructive pulmonary disease) (HCC) 07/08/2022   Sialoadenitis of submandibular gland 04/28/2022   Macular degeneration 03/01/2022   Myofascial pain 02/10/2022   Lumbar spondylosis 01/03/2022   Acquired clawfoot, left foot 12/16/2021   Acquired clawfoot, right foot 12/16/2021   Claw foot 12/14/2021   Gait abnormality 12/10/2021   Spinal stenosis at L4-L5 level 12/10/2021   Ataxia 11/22/2021   Lightheadedness 11/02/2021   Claudication of lower extremity (HCC) 10/21/2021   Atrophy of calf muscles 10/21/2021   Weakness generalized 10/21/2021   Focal neurological deficit 10/21/2021   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   Pain in joint of  right hip 09/14/2021   High arches 07/22/2021   Hammertoes of both feet 07/22/2021   Neuropathy 07/22/2021   Constipation 05/28/2021   Disequilibrium 05/28/2021   History of colonic polyps 05/28/2021   Renal cyst 05/28/2021   Gastroesophageal reflux disease without esophagitis 05/05/2021   Ingrowing left great toenail 02/15/2021   S/P lumbar fusion 12/24/2020   Spinal stenosis of lumbar region 12/04/2020   Acquired complex renal cyst 11/23/2020   Functional gait abnormality 11/04/2020   Idiopathic neuropathy 11/02/2020   Tinea pedis of both feet 10/28/2020   Loop - Medtronic Linq 10/22/2020 10/22/2020   Benign hypertension with 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) 10/05/2020   Fall with injury 10/05/2020   Hematoma 09/16/2020   Normocytic anemia 09/14/2020   Leukocytosis 09/14/2020   AF (paroxysmal atrial fibrillation) (HCC) 09/14/2020   Chronic anticoagulation 09/14/2020   Hyponatremia 09/14/2020   Pain in left foot 01/22/2020   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   Pain in joint of right knee 12/27/2019   Recurrent falls 11/17/2019   Paroxysmal atrial fibrillation (HCC) 10/22/2019   Pain in right foot 10/14/2019   Falls 10/07/2019   Ataxia involving legs 08/12/2019   OSA on CPAP 06/20/2018   Urge incontinence 04/13/2018   Urinary urgency 04/13/2018   Pain in left knee 03/01/2018   Other intervertebral disc degeneration, lumbar region 06/16/2017   Acquired bilateral hammer toes 10/05/2015   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   Generalized anxiety disorder 05/12/2015   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   History of left-sided carotid endarterectomy 04/10/2014   Essential hypertension 04/10/2014   Occlusion and stenosis of carotid artery without mention of cerebral infarction 12/15/2011   Habitual alcohol use 12/01/2011   Carotid artery stenosis,  symptomatic 11/30/2011   Cerebral infarction (HCC) 11/29/2011   Mixed hyperlipidemia     PCP: Scherrie Curt  REFERRING PROVIDER: Scherrie Curt  REFERRING DIAG: weakness, multiple falls  Rationale for Evaluation and Treatment: Rehabilitation  THERAPY DIAG:  Other abnormalities of gait and mobility  Muscle weakness (generalized)  Repeated falls  Contracture of muscle, multiple sites  ONSET DATE: 10/07/22  SUBJECTIVE:  SUBJECTIVE STATEMENT: OK, no falls  Doing all right, had one fall, fell in the shower yesterday. Did exercises before the showers, legs gave out  on him  PERTINENT HISTORY:  Cerebral infarct 10/21/21, lumbar fusion, recurrent falls, has significant neuropathy and this is the reason for most of his falls   PAIN:  Are you having pain? Yes: NPRS scale: 0/10 Pain location: right knee Pain description: ache  Aggravating factors: being on my feet  Relieving factors: movement  PRECAUTIONS: Fall  WEIGHT BEARING RESTRICTIONS: No  FALLS:  Has patient fallen in last 6 months? Yes. Number of falls every few days   LIVING ENVIRONMENT: Lives with: lives with their spouse Lives in: House/apartment Stairs: Yes: Internal: 20 steps; on right going up Has following equipment at home: Otho Blitz - 4 wheeled, Wheelchair (manual), shower chair, Tour manager, Grab bars, Ramped entry, and trekking pole  OCCUPATION: Retired  PLOF: Independent, prior to a year ago he was driving used trekking poles to walk  PATIENT GOALS: live at home  NEXT MD VISIT:   OBJECTIVE:   DIAGNOSTIC FINDINGS:    LUMBAR SPINE FINDINGS: 1. Wide laminectomy and posterior pedicle screw and rod fixation at L4-5 without residual or recurrent stenosis. 2. Progressive adjacent level disease at L3-4 with moderate  right and mild left subarticular narrowing and moderate foraminal stenosis bilaterally. Progression is predominantly due to facet disease 3. Progressive moderate facet hypertrophy at L1-2 with a progressive rightward disc protrusion resulting in progressive moderate right foraminal stenosis. Asymmetric endplate marrow edema on the right at L1-2 is consistent with progressive disease as well. 4. Otherwise stable degenerative change without other focal stenosis.   THORACIC SPINE FINDINGS:  On sagittal views the vertebral bodies have normal height and alignment.  Hemangioma within T9 vertebral body and slightly within T10 vertebral body.  Anterior kyphosis.  Mild scoliosis convex right centered at T8 level. The spinal cord is normal in size and appearance. The paraspinal soft tissues are unremarkable.     On axial views there is no spinal stenosis or foraminal narrowing.  Limited views of the aorta, kidneys, liver, lungs and paraspinal muscles are notable for multiple large right renal cysts ranging in size from 1.5 to 7 cm.   COGNITION: Overall cognitive status: Within functional limits for tasks assessed     SENSATION: WFL  MUSCLE LENGTH: Hamstrings: mod tightness on both sides  POSTURE: rounded shoulders and forward head  LOWER EXTREMITY ROM:   the right knee has HS, has no quad tendon on the right, unable to lift the leg, left leg WFL's , left hip and right hip and ankles WFL's   LOWER EXTREMITY MMT: 4-/5 for the LE's except for the right knee extension UPPER EXTREMITY MMT:  4-/5 for shoulders and elbows   FUNCTIONAL TESTS:  5XSTS: 83 seconds really has to use his hands and is unsteady with standing Cannot stand on his own without holding onto something due to poor balance   Transfers:  needs set up and MinA for safety, is impulsive and unsafe at times. GAIT: He is w/c dependent    TODAY'S TREATMENT:  DATE:  08/10/23 AT MAT TABLE Seated sit up w/ OHP 2x15 Fiter press 2 blue 2x15 each Seated OHP 5lb 2x11 Seated trunk rotation w/ blue ball 2x15 Nustep level 6 x 8 minutes, LE only level 5 x 4 minutes  08/07/23 Nustep level 6 x 8 minutes, LE only level 5 x 4 minutes Leg curls 45# 2x15, then right only 20# 2x15 15# rows 15# extensions 10# chest press 20# biceps Supine feet on ball K2C, rotation, bridges, isometric abs Passive right knee extension Supine black tband hip extension bilaterally 2x15  08/02/23 Nustep level 6 x 8 minutes then LE only level 5 x 4 minutes Leg curls45#  both legs 20# right only Leg extension 15# left only Fittetr press 2 blue 2x15 15# shoulder ext. 2x15 15# rows 2x15 10# bicep curls 2x15 10 # chest press 2x15 5# shoulder press 3x10  07/31/23 Nustep level 6 x 8 minutes then LE only level 5 x 4 minutes Leg curls45#  both legs 20# right only Leg extension 10# left only 15# biceps curls 15# on pulleys rows 15# extensions in w/c Supine feet on ball bridges, rotation, isometric abs Passive right knee extension Black tband clamshells  07/26/23 Nustep level 6 x 8 minutes, no arms level 5 x 3 minutes In Pbars with brace locked trying to do some weight shifts and weight bearing on the right leg with him lifting the left Passive stretch right HS Seated pulley 15# each side rows and straight arm pulls 10# each pulley seated chest press Feet on ball K2C, rotation, bridges, isometric abs Brace locked right leg SLR's Side lying abduction Supine passive right knee extension Supine black tband hip extension Education of the patient  07/24/23 Nustep level 6 x 8 minutes, no arms level 5 x 3 minutes Leg curls 45# 2x15, then right only 25# 2x10 Leg extension 20# left only x10, then 15# x10 Had a brace on the right knee could not get it right to try w/b on the right  Feet on ball bridges, rotation, isometric  abs Brace locked SLR, hip abduction  07/21/23 Nustep level 6 x 8 minutes, LE only L5 x 3 min Seated in w/c 15# rows, straight arm pulls 15lb  biceps 10lb 2x15 10# each pulley chest press At Mat table  Seated Crunches    W/ OHP yellow wball  Seated trunk rotatons bluw ba//  Horiz abd blue 2x10  HS curls blue 2x15  07/19/23 Nustep level 6 x 7 minutes Seated in w/c 15# rows, straight arm pulls, 15# biceps 2x15 10# each pulley chest press 20# single leg curls 2x15 Supine feet on ball bridge, rotation, isometric abs Black tband clamshells Ball b/n knees squeeze with bridge Black tband hip extension  07/05/23 Nustep level 6 x 7 minutes, level 5 LE only x 5 minutes Biceps 15#  Rows 15# Extension 15# Feet on ball trunk rotation, bridges, isometric abs Black tband clamshells in [employer] squeeze with bridge In Pbars high knee raises PT blocking the right knee  07/03/23 NuStep L6 x7 min, x  L5 4 min LE only Shoulder Ext in w/c 15lb  10# biceps curls 15# rows  Chest press 10 2x15 Fitter press 2 blue 1 black 2x15 Sit to stand for elevated surface to w/c HS curls blue 2x15   06/29/23 NuStep L6 x23 min, x 7 min LE only  15# in w/c straight arm pulls 15# biceps curls  06/27/23 NuStep L6 x6 min, x 4 min LE only  15#  in w/c straight arm pulls 15# rows 15# biceps curls 10# chest press Overhead Triceps  ext 2x15 Chest flys 10lb 2x15 WC dips x5, x5, x10 Hs curls blue 2x10  05/2823 UBE L1 x 2 min each  NuStep L6 x4 min, x LE only  Seated at mat without Back support LAQ 5lb 2x15 Seated march 5lb 2x15   UE MMT grosley 4+/5  Blue ball  OHP 2x10  Seated trunk rotations blue ball 2x15  HS curls blue 2x15   06/20/23 NuStep L 5 x 7 min, x4 min LE only RLE hip abd and flex in // bars 2x10 15# in w/c straight arm pulls 15# rows 15# biceps curls 10# chest press At Mat table  Seated trunk rotations blue ball 2x15  Blue ball  OHP 2x10  Horiz abd blue  2x15  06/12/23 NuStep L 5 x 7 min, x3 min LE only At Mat table   STM w/ thea gun to L lateral thigh  Blue ball  OHP 2x15  HS Curl green LLE x10, RLE 2x10  Horiz abd blue 2x10  LAQ LLE 5lb  2x15  Seated trunk rotations blue ball 2x15 PATIENT EDUCATION:  Education details: POC Person educated: Patient Education method: Explanation Education comprehension: verbalized understanding  HOME EXERCISE PROGRAM:  ASSESSMENT:  CLINICAL IMPRESSION: Pt enters doing well. He has an MRI this afternoon and then will see a surgeon next week regarding the results and possible options.  He continues to be impulsive and most of the time is unsafe with transfers. Unable to try standing activities even with the brace on there is so much play that it will buckle quickly.  UE weakness and fatigue with shoulder press.  Interventions performed at [facility] for more core engagement.   Patient is a 83 y.o. male who was seen today for physical therapy evaluation and treatment for repeated falls and balance issues, he has had 3 quadriceps tendon ruptures in the past year, his last rupture was not repaired so he has no active right knee extension.  He reports repeated falls, falling every few days. Pt I very impulsive and unsafe with transfers, he is no longer walking and is w/c dependent.   He will benefit from skilled PT to decrease his risk for falls and address his overall strength, function and safety   OBJECTIVE IMPAIRMENTS: Abnormal gait, cardiopulmonary status limiting activity, decreased activity tolerance, decreased balance, decreased coordination, decreased endurance, decreased mobility, difficulty walking, decreased ROM, decreased strength, improper body mechanics, postural dysfunction, and poor safety.   REHAB POTENTIAL: Fair dependent on carry over and patient compliance  CLINICAL DECISION MAKING: Evolving/moderate complexity  EVALUATION COMPLEXITY: Moderate  GOALS: Goals reviewed with patient?  Yes  SHORT TERM GOALS: Target date: 05/29/23  Patient will be independent with initial HEP. Goal status: Met  05/10/23  LONG TERM GOALS: Target date: 07/29/23  Patient will be independent with advanced/ongoing HEP to improve outcomes and carryover.  Goal status:ongoing 07/31/23  2.  Understand the need for safety with his transfers and all the steps to be as safe as possible Goal status: Ongoing 06/01/23  3.  Patient will demonstrate improved functional LE strength as demonstrated by < 25s on 5xSTS. Baseline: 83 seconds had to use hands and was unable to be independent with the standing Goal status: Progressing 07/31/23  4. Increase UE strength to 4+/5 for better transfers and function Goal status: Progressing  07/26/23  5.  Patient will report no falls in a 4 weeks period.  Baseline: falls every few days Goal status: Ongoing 06/01/23 fall last "20 or 21" of Feburary, ongoing 07/31/23  PLAN:  PT FREQUENCY: 2x/week  PT DURATION: 12 weeks  PLANNED INTERVENTIONS: Therapeutic exercises, Therapeutic activity, Neuromuscular re-education, Balance training, Gait training, Patient/Family education, Self Care, Joint mobilization, Stair training, Cryotherapy, Moist heat, and Manual therapy.  PLAN FOR NEXT SESSION: needs total body strength to help with his safety in transfers and independence. Try the brace and him activating glutes  Ollen Beverage, PTA

## 2023-08-10 NOTE — Progress Notes (Unsigned)
 Brandon Mendoza

## 2023-08-11 NOTE — Procedures (Signed)
 [facility] Brandon Mendoza. 83 year old male Jun 25, 1940   HOME SLEEP TEST REPORT ( by Watch PAT)   STUDY DATE:  08-08-2023 and data loaded on 08-10-2023    ORDERING CLINICIAN: Neomia Banner, MD  REFERRING CLINICIAN: Terrilyn Fick, NP    CLINICAL INFORMATION/HISTORY:  Video Visit 07/06/23 [name]. is a 83 y.o. male . He continues CPAP therapy nightly. Average usage about 5-6 hours. AHI well managed. He does usually sleep another 1-2 hours after waking to use restroom and does not restart therapy. He wakes feeling refreshed. Last machine set up early 2020.  (Pressure of 5-10 cm water  , 2 cm EPR, AHI 2.6/h . A pulmonary node was followed up by PCP Brandon Mendoza in January 2025)  He was last seen by Dr Brandon Mendoza 06/2021 and referred to Dr Brandon Mendoza 11/2021 for imbalance, lower ext weakness and pain. 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 PT and follow up with PCP advised. Since, he reports doing fairly well. Unfortunately he had a fall last year and ruptured his right quadriceps tendon. He had surgical repair that failed and returned for revision. He fell again and ruptured same tendon. He is being referred to ortho specialist for consult for reconstruction.   2022: sees cardiology for atrial fibrillation. He was on flecainide  and Eliquis. Plavix  discontinued (prior CVA in 2002)       Epworth sleepiness score: X/24.   BMI: 29 kg/m     FINDINGS:   Sleep Summary:   Total Recording Time (hours, min): 8 hours 41 minutes      Total Sleep Time (hours, min): 7 hours 46 minutes               Percent REM (%): 28%    Sleep latency was 28 minutes.                                    Respiratory Indices:   Calculated pAHI (per  CMS guideline):   39/h, approximately 16% of all apneas and hypopneas were central apneas.                    REM pAHI: 50/h                                                NREM  pAHI:    35/h                          Positional AHI:   The patient slept exclusively in supine position Snoring:   Snoring reached a mean volume of 42 dB which is moderate and was present for half of the total recorded sleep time.                                             Oxygen Saturation Statistics:   Oxygen Saturation (%) Mean:   Mean oxygen saturation was 91% between the nadir at 85 and a maximum saturation of 97%.  O2 Saturation (minutes) <89%:   9 minutes, or 2% of total recorded time.        Pulse Rate Statistics:   Pulse Mean (bpm): 68 bpm, please note that the cardiac rhythm cannot be reflected in the recordings of this home sleep test.                Pulse Range:   Between 49 and 111 beats per minute.              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  There were many desaturation events but none was lasting a clinically significant time.  Central apneas were 16% of the total apnea count.    RECOMMENDATION: Continuation of positive airway pressure therapy is strongly recommended: The patient at baseline has a low mean oxygen level and positive airway pressure can help to prevent hypoxia at night.   There is a component of central apneas that could not be addressed in any other therapy currently known aside from positive airway pressure therapy.  I will order a new auto titration device with settings identical to the previous machine the patient can continue to use a mask of his choice and I will order heated humidification.  I will order a ResMed autotitration CPAP device between 5 and 10 cm water  pressure with 2 cm expiratory pressure relief.   NTERPRETING PHYSICIAN:   Brandon Banner, MD  [facility] Sleep Board certified by The [employer] of Sleep Medicine and Diplomate of the [employer] of Sleep Medicine. Board  certified In Neurology through the ABPN, Fellow of the [employer] of Neurology.

## 2023-08-12 ENCOUNTER — Ambulatory Visit: Payer: Self-pay | Admitting: Family Medicine

## 2023-08-15 ENCOUNTER — Ambulatory Visit: Admitting: Physical Therapy

## 2023-08-15 ENCOUNTER — Encounter: Payer: Self-pay | Admitting: Physical Therapy

## 2023-08-15 DIAGNOSIS — M25561 Pain in right knee: Secondary | ICD-10-CM

## 2023-08-15 DIAGNOSIS — R2689 Other abnormalities of gait and mobility: Secondary | ICD-10-CM

## 2023-08-15 DIAGNOSIS — M6281 Muscle weakness (generalized): Secondary | ICD-10-CM

## 2023-08-15 DIAGNOSIS — M6249 Contracture of muscle, multiple sites: Secondary | ICD-10-CM

## 2023-08-15 DIAGNOSIS — R296 Repeated falls: Secondary | ICD-10-CM

## 2023-08-15 DIAGNOSIS — M5459 Other low back pain: Secondary | ICD-10-CM

## 2023-08-15 NOTE — Progress Notes (Signed)
 [facility] Summary Report / Loop Recorder

## 2023-08-15 NOTE — Therapy (Signed)
 OUTPATIENT PHYSICAL THERAPY TREATMENT      Patient Name: Onesimo Lingard. MRN: [id number] DOB:07-30-1940, 83 y.o., male Today's Date: 08/15/2023  END OF SESSION:  PT End of Session - 08/15/23 0758     Visit Number 28    Date for PT Re-Evaluation 08/31/23    Authorization Type Medicare    PT Start Time 0758    PT Stop Time 0844    PT Time Calculation (min) 46 min    Activity Tolerance Patient tolerated treatment well    Behavior During Therapy [facility] for tasks assessed/performed;Impulsive             Past Medical History:  Diagnosis Date   Acquired clawfoot, right foot 12/16/2021   Anxiety    Atrophy of calf muscles 10/21/2021   Duplex calves was negative for significant blockage EMG was done at [facility] MRI lumbar spine was also done at [facility]   Carotid artery occlusion    CKD (chronic kidney disease)    Stage 3   COPD (chronic obstructive pulmonary disease) (HCC)    Depression    Dysrhythmia    PAF, atrial tachycardia   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   The spinal cord appears normal. 2.   No spinal stenosis. 3.   Mild multilevel degenerative changes as detailed above that do not lead to spinal stenosis or nerve root compression. 4.   T2 hyperintense foci within the pons consistent with chronic microvascular ischemic changes.   Loop - Medtronic Linq 10/22/2020 10/22/2020   Nocturia    Paroxysmal atrial fibrillation (HCC)    Peripheral neuropathy    Peripheral vascular disease (HCC)    Sleep apnea    on 6 cm, nasal pillow   Spinal stenosis    Stroke (HCC) 05/01/2011   ischemic   Weakness generalized 10/21/2021   Severe, legs and arms   Past Surgical History:  Procedure Laterality Date   CAROTID ENDARTERECTOMY  12/09/11    Left cea   ENDARTERECTOMY  12/09/2011   Procedure: ENDARTERECTOMY CAROTID;  Surgeon: Mayo Speck, MD;  Location: [facility] OR;  Service: Vascular;  Laterality: Left;   EYE SURGERY     rt retina detachment   HAMMER TOE SURGERY  2004   HERNIA REPAIR  2006   I & D EXTREMITY Right 10/07/2022   Procedure: IRRIGATION AND DEBRIDEMENT EXTREMITY WITH WOUND CLOSURE;  Surgeon: Mort Ards, MD;  Location: [facility] ORS;  Service: Orthopedics;  Laterality: Right;   QUADRICEPS TENDON REPAIR Right 07/12/2022   Procedure: REPAIR QUADRICEP TENDON;  Surgeon: Claiborne Crew, MD;  Location: [facility] ORS;  Service: Orthopedics;  Laterality: Right;   QUADRICEPS TENDON REPAIR Right 08/30/2022   Procedure: REPAIR QUADRICEP TENDON;  Surgeon: Claiborne Crew, MD;  Location: [facility] ORS;  Service: Orthopedics;  Laterality: Right;   SHOULDER ARTHROSCOPY W/ ROTATOR CUFF REPAIR  2010   TONSILLECTOMY     Patient Active Problem List   Diagnosis Date Noted   Coronary artery calcification 05/09/2023   Rupture of quadriceps tendon 11/03/2022   Wound dehiscence 10/07/2022   Quadriceps tendon rupture, right, subsequent encounter 08/30/2022   Quadriceps tendon rupture, right, initial encounter 07/12/2022   Unable to care for self 07/08/2022  Dehydration 07/08/2022   Depression 07/08/2022   COPD (chronic obstructive pulmonary disease) (HCC) 07/08/2022   Sialoadenitis of submandibular gland 04/28/2022   Macular degeneration 03/01/2022   Myofascial pain 02/10/2022   Lumbar spondylosis 01/03/2022   Acquired clawfoot, left foot 12/16/2021   Acquired clawfoot, right foot 12/16/2021   Claw foot 12/14/2021   Gait abnormality 12/10/2021   Spinal stenosis at L4-L5 level 12/10/2021   Ataxia 11/22/2021   Lightheadedness 11/02/2021   Claudication of lower extremity (HCC) 10/21/2021   Atrophy of calf muscles 10/21/2021   Weakness generalized 10/21/2021   Focal neurological deficit 10/21/2021   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   Memory impairment of gradual onset  10/21/2021   Pain in joint of right hip 09/14/2021   High arches 07/22/2021   Hammertoes of both feet 07/22/2021   Neuropathy 07/22/2021   Constipation 05/28/2021   Disequilibrium 05/28/2021   History of colonic polyps 05/28/2021   Renal cyst 05/28/2021   Gastroesophageal reflux disease without esophagitis 05/05/2021   Ingrowing left great toenail 02/15/2021   S/P lumbar fusion 12/24/2020   Spinal stenosis of lumbar region 12/04/2020   Acquired complex renal cyst 11/23/2020   Functional gait abnormality 11/04/2020   Idiopathic neuropathy 11/02/2020   Tinea pedis of both feet 10/28/2020   Loop - Medtronic Linq 10/22/2020 10/22/2020   Benign hypertension with 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) 10/05/2020   Fall with injury 10/05/2020   Hematoma 09/16/2020   Normocytic anemia 09/14/2020   Leukocytosis 09/14/2020   AF (paroxysmal atrial fibrillation) (HCC) 09/14/2020   Chronic anticoagulation 09/14/2020   Hyponatremia 09/14/2020   Pain in left foot 01/22/2020   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   Pain in joint of right knee 12/27/2019   Recurrent falls 11/17/2019   Paroxysmal atrial fibrillation (HCC) 10/22/2019   Pain in right foot 10/14/2019   Falls 10/07/2019   Ataxia involving legs 08/12/2019   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   Urge incontinence 04/13/2018   Urinary urgency 04/13/2018   Pain in left knee 03/01/2018   Other intervertebral disc degeneration, lumbar region 06/16/2017   Acquired bilateral hammer toes 10/05/2015   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   Generalized anxiety disorder 05/12/2015   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   History of left-sided carotid endarterectomy 04/10/2014   Essential hypertension 04/10/2014   Occlusion and stenosis of carotid artery without mention of cerebral infarction 12/15/2011   Habitual  alcohol use 12/01/2011   Carotid artery stenosis, symptomatic 11/30/2011   Cerebral infarction (HCC) 11/29/2011   Mixed hyperlipidemia     PCP: Scherrie Curt  REFERRING PROVIDER: Scherrie Curt  REFERRING DIAG: weakness, multiple falls  Rationale for Evaluation and Treatment: Rehabilitation  THERAPY DIAG:  Other abnormalities of gait and mobility  Muscle weakness (generalized)  Repeated falls  Contracture of muscle, multiple sites  Acute pain of right knee  Other low back pain  ONSET DATE: 10/07/22  SUBJECTIVE:  SUBJECTIVE STATEMENT: Doing okay, no falls, had MRI last week, sees surgeon on Thursday  Doing all right, had one fall, fell in the shower yesterday. Did exercises before the showers, legs gave out  on him  PERTINENT HISTORY:  Cerebral infarct 10/21/21, lumbar fusion, recurrent falls, has significant neuropathy and this is the reason for most of his falls   PAIN:  Are you having pain? Yes: NPRS scale: 0/10 Pain location: right knee Pain description: ache  Aggravating factors: being on my feet  Relieving factors: movement  PRECAUTIONS: Fall  WEIGHT BEARING RESTRICTIONS: No  FALLS:  Has patient fallen in last 6 months? Yes. Number of falls every few days   LIVING ENVIRONMENT: Lives with: lives with their spouse Lives in: House/apartment Stairs: Yes: Internal: 20 steps; on right going up Has following equipment at home: Otho Blitz - 4 wheeled, Wheelchair (manual), shower chair, Tour manager, Grab bars, Ramped entry, and trekking pole  OCCUPATION: Retired  PLOF: Independent, prior to a year ago he was driving used trekking poles to walk  PATIENT GOALS: live at home  NEXT MD VISIT:   OBJECTIVE:   DIAGNOSTIC FINDINGS:    LUMBAR SPINE FINDINGS: 1. Wide laminectomy and  posterior pedicle screw and rod fixation at L4-5 without residual or recurrent stenosis. 2. Progressive adjacent level disease at L3-4 with moderate right and mild left subarticular narrowing and moderate foraminal stenosis bilaterally. Progression is predominantly due to facet disease 3. Progressive moderate facet hypertrophy at L1-2 with a progressive rightward disc protrusion resulting in progressive moderate right foraminal stenosis. Asymmetric endplate marrow edema on the right at L1-2 is consistent with progressive disease as well. 4. Otherwise stable degenerative change without other focal stenosis.   THORACIC SPINE FINDINGS:  On sagittal views the vertebral bodies have normal height and alignment.  Hemangioma within T9 vertebral body and slightly within T10 vertebral body.  Anterior kyphosis.  Mild scoliosis convex right centered at T8 level. The spinal cord is normal in size and appearance. The paraspinal soft tissues are unremarkable.     On axial views there is no spinal stenosis or foraminal narrowing.  Limited views of the aorta, kidneys, liver, lungs and paraspinal muscles are notable for multiple large right renal cysts ranging in size from 1.5 to 7 cm.   COGNITION: Overall cognitive status: Within functional limits for tasks assessed     SENSATION: WFL  MUSCLE LENGTH: Hamstrings: mod tightness on both sides  POSTURE: rounded shoulders and forward head  LOWER EXTREMITY ROM:   the right knee has HS, has no quad tendon on the right, unable to lift the leg, left leg WFL's , left hip and right hip and ankles WFL's   LOWER EXTREMITY MMT: 4-/5 for the LE's except for the right knee extension UPPER EXTREMITY MMT:  4-/5 for shoulders and elbows   FUNCTIONAL TESTS:  5XSTS: 83 seconds really has to use his hands and is unsteady with standing Cannot stand on his own without holding onto something due to poor balance   Transfers:  needs set up and MinA for safety, is  impulsive and unsafe at times. GAIT: He is w/c dependent    TODAY'S TREATMENT:  DATE:  08/15/23 Nustep level 6 x 10 minutes In Pbars standing trying to get him to extend the right hip and have knee locked to bear weight on the right without the knee buckling LEg press 60# 2x15, then left only 40# Supine feet on ball K2C, rotation, bridges, isometric abs Passive stretch right knee Black tband clamshells Black tband right hip extension Passive stretch right knee into extension Bridges   08/10/23 AT MAT TABLE Seated sit up w/ OHP 2x15 Fiter press 2 blue 2x15 each Seated OHP 5lb 2x11 Seated trunk rotation w/ blue ball 2x15 Nustep level 6 x 8 minutes, LE only level 5 x 4 minutes  08/07/23 Nustep level 6 x 8 minutes, LE only level 5 x 4 minutes Leg curls 45# 2x15, then right only 20# 2x15 15# rows 15# extensions 10# chest press 20# biceps Supine feet on ball K2C, rotation, bridges, isometric abs Passive right knee extension Supine black tband hip extension bilaterally 2x15  08/02/23 Nustep level 6 x 8 minutes then LE only level 5 x 4 minutes Leg curls45#  both legs 20# right only Leg extension 15# left only Fittetr press 2 blue 2x15 15# shoulder ext. 2x15 15# rows 2x15 10# bicep curls 2x15 10 # chest press 2x15 5# shoulder press 3x10  07/31/23 Nustep level 6 x 8 minutes then LE only level 5 x 4 minutes Leg curls45#  both legs 20# right only Leg extension 10# left only 15# biceps curls 15# on pulleys rows 15# extensions in w/c Supine feet on ball bridges, rotation, isometric abs Passive right knee extension Black tband clamshells  07/26/23 Nustep level 6 x 8 minutes, no arms level 5 x 3 minutes In Pbars with brace locked trying to do some weight shifts and weight bearing on the right leg with him lifting the left Passive stretch right HS Seated  pulley 15# each side rows and straight arm pulls 10# each pulley seated chest press Feet on ball K2C, rotation, bridges, isometric abs Brace locked right leg SLR's Side lying abduction Supine passive right knee extension Supine black tband hip extension Education of the patient  07/24/23 Nustep level 6 x 8 minutes, no arms level 5 x 3 minutes Leg curls 45# 2x15, then right only 25# 2x10 Leg extension 20# left only x10, then 15# x10 Had a brace on the right knee could not get it right to try w/b on the right  Feet on ball bridges, rotation, isometric abs Brace locked SLR, hip abduction  07/21/23 Nustep level 6 x 8 minutes, LE only L5 x 3 min Seated in w/c 15# rows, straight arm pulls 15lb  biceps 10lb 2x15 10# each pulley chest press At Mat table  Seated Crunches    W/ OHP yellow wball  Seated trunk rotatons bluw ba//  Horiz abd blue 2x10  HS curls blue 2x15  07/19/23 Nustep level 6 x 7 minutes Seated in w/c 15# rows, straight arm pulls, 15# biceps 2x15 10# each pulley chest press 20# single leg curls 2x15 Supine feet on ball bridge, rotation, isometric abs Black tband clamshells Ball b/n knees squeeze with bridge Black tband hip extension  07/05/23 Nustep level 6 x 7 minutes, level 5 LE only x 5 minutes Biceps 15#  Rows 15# Extension 15# Feet on ball trunk rotation, bridges, isometric abs Black tband clamshells in [employer] squeeze with bridge In Pbars high knee raises PT blocking the right knee  07/03/23 NuStep L6 x7 min, x  L5 4 min LE  only Shoulder Ext in w/c 15lb  10# biceps curls 15# rows  Chest press 10 2x15 Fitter press 2 blue 1 black 2x15 Sit to stand for elevated surface to w/c HS curls blue 2x15   06/29/23 NuStep L6 x23 min, x 7 min LE only  15# in w/c straight arm pulls 15# biceps curls  06/27/23 NuStep L6 x6 min, x 4 min LE only  15# in w/c straight arm pulls 15# rows 15# biceps curls 10# chest press Overhead Triceps  ext 2x15 Chest flys  10lb 2x15 WC dips x5, x5, x10 Hs curls blue 2x10  05/2823 UBE L1 x 2 min each  NuStep L6 x4 min, x LE only  Seated at mat without Back support LAQ 5lb 2x15 Seated march 5lb 2x15   UE MMT grosley 4+/5  Blue ball  OHP 2x10  Seated trunk rotations blue ball 2x15  HS curls blue 2x15   06/20/23 NuStep L 5 x 7 min, x4 min LE only RLE hip abd and flex in // bars 2x10 15# in w/c straight arm pulls 15# rows 15# biceps curls 10# chest press At Mat table  Seated trunk rotations blue ball 2x15  Blue ball  OHP 2x10  Horiz abd blue 2x15  06/12/23 NuStep L 5 x 7 min, x3 min LE only At Mat table   STM w/ thea gun to L lateral thigh  Blue ball  OHP 2x15  HS Curl green LLE x10, RLE 2x10  Horiz abd blue 2x10  LAQ LLE 5lb  2x15  Seated trunk rotations blue ball 2x15 PATIENT EDUCATION:  Education details: POC Person educated: Patient Education method: Explanation Education comprehension: verbalized understanding  HOME EXERCISE PROGRAM:  ASSESSMENT:  CLINICAL IMPRESSION: Had MRI and will see the surgeon later this week.  I am continuing to work on total body strength and function as well as educate him in safety, as he is impulsive and unsafe.  He will be seeing the surgeon regarding possible repair, but unsure if he can follow any protocol restrictions  Patient is a 83 y.o. male who was seen today for physical therapy evaluation and treatment for repeated falls and balance issues, he has had 3 quadriceps tendon ruptures in the past year, his last rupture was not repaired so he has no active right knee extension.  He reports repeated falls, falling every few days. Pt I very impulsive and unsafe with transfers, he is no longer walking and is w/c dependent.   He will benefit from skilled PT to decrease his risk for falls and address his overall strength, function and safety   OBJECTIVE IMPAIRMENTS: Abnormal gait, cardiopulmonary status limiting activity, decreased activity tolerance,  decreased balance, decreased coordination, decreased endurance, decreased mobility, difficulty walking, decreased ROM, decreased strength, improper body mechanics, postural dysfunction, and poor safety.   REHAB POTENTIAL: Fair dependent on carry over and patient compliance  CLINICAL DECISION MAKING: Evolving/moderate complexity  EVALUATION COMPLEXITY: Moderate  GOALS: Goals reviewed with patient? Yes  SHORT TERM GOALS: Target date: 05/29/23  Patient will be independent with initial HEP. Goal status: Met  05/10/23  LONG TERM GOALS: Target date: 07/29/23  Patient will be independent with advanced/ongoing HEP to improve outcomes and carryover.  Goal status:ongoing 07/31/23  2.  Understand the need for safety with his transfers and all the steps to be as safe as possible Goal status: Ongoing 06/01/23  3.  Patient will demonstrate improved functional LE strength as demonstrated by < 25s on 5xSTS. Baseline: 83  seconds had to use hands and was unable to be independent with the standing Goal status: Progressing 07/31/23  4. Increase UE strength to 4+/5 for better transfers and function Goal status: Progressing  07/26/23  5.  Patient will report no falls in a 4 weeks period. Baseline: falls every few days Goal status: Ongoing 06/01/23 fall last "20 or 21" of Feburary, ongoing 07/31/23  PLAN:  PT FREQUENCY: 2x/week  PT DURATION: 12 weeks  PLANNED INTERVENTIONS: Therapeutic exercises, Therapeutic activity, Neuromuscular re-education, Balance training, Gait training, Patient/Family education, Self Care, Joint mobilization, Stair training, Cryotherapy, Moist heat, and Manual therapy.  PLAN FOR NEXT SESSION: needs total body strength to help with his safety in transfers and independence. Try the brace and him activating glutes, may need to set up maintenance plan if they elect to not do surgery  Hollis Lurie, PT

## 2023-08-17 ENCOUNTER — Ambulatory Visit: Admitting: Physical Therapy

## 2023-08-18 ENCOUNTER — Ambulatory Visit: Admitting: Physical Therapy

## 2023-08-18 ENCOUNTER — Encounter: Payer: Self-pay | Admitting: Physical Therapy

## 2023-08-18 ENCOUNTER — Other Ambulatory Visit: Payer: Self-pay | Admitting: Neurology

## 2023-08-18 DIAGNOSIS — M25561 Pain in right knee: Secondary | ICD-10-CM

## 2023-08-18 DIAGNOSIS — M6249 Contracture of muscle, multiple sites: Secondary | ICD-10-CM

## 2023-08-18 DIAGNOSIS — R296 Repeated falls: Secondary | ICD-10-CM

## 2023-08-18 DIAGNOSIS — R2689 Other abnormalities of gait and mobility: Secondary | ICD-10-CM

## 2023-08-18 DIAGNOSIS — M6281 Muscle weakness (generalized): Secondary | ICD-10-CM

## 2023-08-18 NOTE — Therapy (Signed)
 OUTPATIENT PHYSICAL THERAPY TREATMENT      Patient Name: Brandon Mendoza. MRN: [id number] DOB:09/07/1940, 83 y.o., male Today's Date: 08/18/2023  END OF SESSION:  PT End of Session - 08/18/23 0852     Visit Number 29    Date for PT Re-Evaluation 08/31/23    PT Start Time 0850    PT Stop Time 0930    PT Time Calculation (min) 40 min    Activity Tolerance Patient tolerated treatment well    Behavior During Therapy [facility] for tasks assessed/performed;Impulsive             Past Medical History:  Diagnosis Date   Acquired clawfoot, right foot 12/16/2021   Anxiety    Atrophy of calf muscles 10/21/2021   Duplex calves was negative for significant blockage EMG was done at [facility] MRI lumbar spine was also done at [facility]   Carotid artery occlusion    CKD (chronic kidney disease)    Stage 3   COPD (chronic obstructive pulmonary disease) (HCC)    Depression    Dysrhythmia    PAF, atrial tachycardia   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   The spinal cord appears normal. 2.   No spinal stenosis. 3.   Mild multilevel degenerative changes as detailed above that do not lead to spinal stenosis or nerve root compression. 4.   T2 hyperintense foci within the pons consistent with chronic microvascular ischemic changes.   Loop - Medtronic Linq 10/22/2020 10/22/2020   Nocturia    Paroxysmal atrial fibrillation (HCC)    Peripheral neuropathy    Peripheral vascular disease (HCC)    Sleep apnea    on 6 cm, nasal pillow   Spinal stenosis    Stroke (HCC) 05/01/2011   ischemic   Weakness generalized 10/21/2021   Severe, legs and arms   Past Surgical History:  Procedure Laterality Date   CAROTID ENDARTERECTOMY  12/09/11   Left cea   ENDARTERECTOMY   12/09/2011   Procedure: ENDARTERECTOMY CAROTID;  Surgeon: Mayo Speck, MD;  Location: [facility] OR;  Service: Vascular;  Laterality: Left;   EYE SURGERY     rt retina detachment   HAMMER TOE SURGERY  2004   HERNIA REPAIR  2006   I & D EXTREMITY Right 10/07/2022   Procedure: IRRIGATION AND DEBRIDEMENT EXTREMITY WITH WOUND CLOSURE;  Surgeon: Mort Ards, MD;  Location: [facility] ORS;  Service: Orthopedics;  Laterality: Right;   QUADRICEPS TENDON REPAIR Right 07/12/2022   Procedure: REPAIR QUADRICEP TENDON;  Surgeon: Claiborne Crew, MD;  Location: [facility] ORS;  Service: Orthopedics;  Laterality: Right;   QUADRICEPS TENDON REPAIR Right 08/30/2022   Procedure: REPAIR QUADRICEP TENDON;  Surgeon: Claiborne Crew, MD;  Location: [facility];  Service: Orthopedics;  Laterality: Right;   SHOULDER ARTHROSCOPY W/ ROTATOR CUFF REPAIR  2010   TONSILLECTOMY     Patient Active Problem List   Diagnosis Date Noted   Coronary artery calcification 05/09/2023   Rupture of quadriceps tendon 11/03/2022   Wound dehiscence 10/07/2022   Quadriceps tendon rupture, right, subsequent encounter 08/30/2022   Quadriceps tendon rupture, right, initial encounter 07/12/2022   Unable to care for self 07/08/2022   Dehydration 07/08/2022   Depression  07/08/2022   COPD (chronic obstructive pulmonary disease) (HCC) 07/08/2022   Sialoadenitis of submandibular gland 04/28/2022   Macular degeneration 03/01/2022   Myofascial pain 02/10/2022   Lumbar spondylosis 01/03/2022   Acquired clawfoot, left foot 12/16/2021   Acquired clawfoot, right foot 12/16/2021   Claw foot 12/14/2021   Gait abnormality 12/10/2021   Spinal stenosis at L4-L5 level 12/10/2021   Ataxia 11/22/2021   Lightheadedness 11/02/2021   Claudication of lower extremity (HCC) 10/21/2021   Atrophy of calf muscles 10/21/2021   Weakness generalized 10/21/2021   Focal neurological deficit 10/21/2021   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   Pain in joint of  right hip 09/14/2021   High arches 07/22/2021   Hammertoes of both feet 07/22/2021   Neuropathy 07/22/2021   Constipation 05/28/2021   Disequilibrium 05/28/2021   History of colonic polyps 05/28/2021   Renal cyst 05/28/2021   Gastroesophageal reflux disease without esophagitis 05/05/2021   Ingrowing left great toenail 02/15/2021   S/P lumbar fusion 12/24/2020   Spinal stenosis of lumbar region 12/04/2020   Acquired complex renal cyst 11/23/2020   Functional gait abnormality 11/04/2020   Idiopathic neuropathy 11/02/2020   Tinea pedis of both feet 10/28/2020   Loop - Medtronic Linq 10/22/2020 10/22/2020   Benign hypertension with 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) 10/05/2020   Fall with injury 10/05/2020   Hematoma 09/16/2020   Normocytic anemia 09/14/2020   Leukocytosis 09/14/2020   AF (paroxysmal atrial fibrillation) (HCC) 09/14/2020   Chronic anticoagulation 09/14/2020   Hyponatremia 09/14/2020   Pain in left foot 01/22/2020   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   Pain in joint of right knee 12/27/2019   Recurrent falls 11/17/2019   Paroxysmal atrial fibrillation (HCC) 10/22/2019   Pain in right foot 10/14/2019   Falls 10/07/2019   Ataxia involving legs 08/12/2019   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   Urge incontinence 04/13/2018   Urinary urgency 04/13/2018   Pain in left knee 03/01/2018   Other intervertebral disc degeneration, lumbar region 06/16/2017   Acquired bilateral hammer toes 10/05/2015   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   Generalized anxiety disorder 05/12/2015   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   History of left-sided carotid endarterectomy 04/10/2014   Essential hypertension 04/10/2014   Occlusion and stenosis of carotid artery without mention of cerebral infarction 12/15/2011   Habitual alcohol use 12/01/2011   Carotid  artery stenosis, symptomatic 11/30/2011   Cerebral infarction (HCC) 11/29/2011   Mixed hyperlipidemia     PCP: Scherrie Curt  REFERRING PROVIDER: Scherrie Curt  REFERRING DIAG: weakness, multiple falls  Rationale for Evaluation and Treatment: Rehabilitation  THERAPY DIAG:  Other abnormalities of gait and mobility  Repeated falls  Contracture of muscle, multiple sites  Muscle weakness (generalized)  Acute pain of right knee  ONSET DATE: 10/07/22  SUBJECTIVE:  SUBJECTIVE STATEMENT: Surgeon said he could do the  surgery, but not sure of he wants to go through it.  Doing all right, had one fall, fell in the shower yesterday. Did exercises before the showers, legs gave out  on him  PERTINENT HISTORY:  Cerebral infarct 10/21/21, lumbar fusion, recurrent falls, has significant neuropathy and this is the reason for most of his falls   PAIN:  Are you having pain? Yes: NPRS scale: 0/10 Pain location: right knee Pain description: ache  Aggravating factors: being on my feet  Relieving factors: movement  PRECAUTIONS: Fall  WEIGHT BEARING RESTRICTIONS: No  FALLS:  Has patient fallen in last 6 months? Yes. Number of falls every few days   LIVING ENVIRONMENT: Lives with: lives with their spouse Lives in: House/apartment Stairs: Yes: Internal: 20 steps; on right going up Has following equipment at home: Otho Blitz - 4 wheeled, Wheelchair (manual), shower chair, Tour manager, Grab bars, Ramped entry, and trekking pole  OCCUPATION: Retired  PLOF: Independent, prior to a year ago he was driving used trekking poles to walk  PATIENT GOALS: live at home  NEXT MD VISIT:   OBJECTIVE:   DIAGNOSTIC FINDINGS:    LUMBAR SPINE FINDINGS: 1. Wide laminectomy and posterior pedicle screw and rod fixation  at L4-5 without residual or recurrent stenosis. 2. Progressive adjacent level disease at L3-4 with moderate right and mild left subarticular narrowing and moderate foraminal stenosis bilaterally. Progression is predominantly due to facet disease 3. Progressive moderate facet hypertrophy at L1-2 with a progressive rightward disc protrusion resulting in progressive moderate right foraminal stenosis. Asymmetric endplate marrow edema on the right at L1-2 is consistent with progressive disease as well. 4. Otherwise stable degenerative change without other focal stenosis.   THORACIC SPINE FINDINGS:  On sagittal views the vertebral bodies have normal height and alignment.  Hemangioma within T9 vertebral body and slightly within T10 vertebral body.  Anterior kyphosis.  Mild scoliosis convex right centered at T8 level. The spinal cord is normal in size and appearance. The paraspinal soft tissues are unremarkable.     On axial views there is no spinal stenosis or foraminal narrowing.  Limited views of the aorta, kidneys, liver, lungs and paraspinal muscles are notable for multiple large right renal cysts ranging in size from 1.5 to 7 cm.   COGNITION: Overall cognitive status: Within functional limits for tasks assessed     SENSATION: WFL  MUSCLE LENGTH: Hamstrings: mod tightness on both sides  POSTURE: rounded shoulders and forward head  LOWER EXTREMITY ROM:   the right knee has HS, has no quad tendon on the right, unable to lift the leg, left leg WFL's , left hip and right hip and ankles WFL's   LOWER EXTREMITY MMT: 4-/5 for the LE's except for the right knee extension UPPER EXTREMITY MMT:  4-/5 for shoulders and elbows   FUNCTIONAL TESTS:  5XSTS: 83 seconds really has to use his hands and is unsteady with standing Cannot stand on his own without holding onto something due to poor balance   Transfers:  needs set up and MinA for safety, is impulsive and unsafe at times. GAIT: He is  w/c dependent    TODAY'S TREATMENT:  DATE:  08/18/23 Nustep level 6 x 6 minutes, LE only level 5 x 3 minutes Leg press 60lb 2x15 Chest press 10lb 2x15 Biceps Curls 10lb 2x10 Rows & Ext 15lb 2x15 At MAT  OHP blue ball 2x15  Seated trunk rotations blue ball   HS curls green 2x15 each 08/15/23 Nustep level 6 x 10 minutes In Pbars standing trying to get him to extend the right hip and have knee locked to bear weight on the right without the knee buckling LEg press 60# 2x15, then left only 40# Supine feet on ball K2C, rotation, bridges, isometric abs Passive stretch right knee Black tband clamshells Black tband right hip extension Passive stretch right knee into extension Bridges   08/10/23 AT MAT TABLE Seated sit up w/ OHP 2x15 Fiter press 2 blue 2x15 each Seated OHP 5lb 2x11 Seated trunk rotation w/ blue ball 2x15 Nustep level 6 x 8 minutes, LE only level 5 x 4 minutes  08/07/23 Nustep level 6 x 8 minutes, LE only level 5 x 4 minutes Leg curls 45# 2x15, then right only 20# 2x15 15# rows 15# extensions 10# chest press 20# biceps Supine feet on ball K2C, rotation, bridges, isometric abs Passive right knee extension Supine black tband hip extension bilaterally 2x15  08/02/23 Nustep level 6 x 8 minutes then LE only level 5 x 4 minutes Leg curls45#  both legs 20# right only Leg extension 15# left only Fittetr press 2 blue 2x15 15# shoulder ext. 2x15 15# rows 2x15 10# bicep curls 2x15 10 # chest press 2x15 5# shoulder press 3x10  07/31/23 Nustep level 6 x 8 minutes then LE only level 5 x 4 minutes Leg curls45#  both legs 20# right only Leg extension 10# left only 15# biceps curls 15# on pulleys rows 15# extensions in w/c Supine feet on ball bridges, rotation, isometric abs Passive right knee extension Black tband clamshells  07/26/23 Nustep  level 6 x 8 minutes, no arms level 5 x 3 minutes In Pbars with brace locked trying to do some weight shifts and weight bearing on the right leg with him lifting the left Passive stretch right HS Seated pulley 15# each side rows and straight arm pulls 10# each pulley seated chest press Feet on ball K2C, rotation, bridges, isometric abs Brace locked right leg SLR's Side lying abduction Supine passive right knee extension Supine black tband hip extension Education of the patient  07/24/23 Nustep level 6 x 8 minutes, no arms level 5 x 3 minutes Leg curls 45# 2x15, then right only 25# 2x10 Leg extension 20# left only x10, then 15# x10 Had a brace on the right knee could not get it right to try w/b on the right  Feet on ball bridges, rotation, isometric abs Brace locked SLR, hip abduction  07/21/23 Nustep level 6 x 8 minutes, LE only L5 x 3 min Seated in w/c 15# rows, straight arm pulls 15lb  biceps 10lb 2x15 10# each pulley chest press At Mat table  Seated Crunches    W/ OHP yellow wball  Seated trunk rotatons bluw ba//  Horiz abd blue 2x10  HS curls blue 2x15  07/19/23 Nustep level 6 x 7 minutes Seated in w/c 15# rows, straight arm pulls, 15# biceps 2x15 10# each pulley chest press 20# single leg curls 2x15 Supine feet on ball bridge, rotation, isometric abs Black tband clamshells Ball b/n knees squeeze with bridge Black tband hip extension  07/05/23 Nustep level 6 x 7 minutes, level 5  LE only x 5 minutes Biceps 15#  Rows 15# Extension 15# Feet on ball trunk rotation, bridges, isometric abs Black tband clamshells in [employer] squeeze with bridge In Pbars high knee raises PT blocking the right knee  07/03/23 NuStep L6 x7 min, x  L5 4 min LE only Shoulder Ext in w/c 15lb  10# biceps curls 15# rows  Chest press 10 2x15 Fitter press 2 blue 1 black 2x15 Sit to stand for elevated surface to w/c HS curls blue 2x15   06/29/23 NuStep L6 x23 min, x 7 min LE only  15# in  w/c straight arm pulls 15# biceps curls  06/27/23 NuStep L6 x6 min, x 4 min LE only  15# in w/c straight arm pulls 15# rows 15# biceps curls 10# chest press Overhead Triceps  ext 2x15 Chest flys 10lb 2x15 WC dips x5, x5, x10 Hs curls blue 2x10  05/2823 UBE L1 x 2 min each  NuStep L6 x4 min, x LE only  Seated at mat without Back support LAQ 5lb 2x15 Seated march 5lb 2x15   UE MMT grosley 4+/5  Blue ball  OHP 2x10  Seated trunk rotations blue ball 2x15  HS curls blue 2x15   06/20/23 NuStep L 5 x 7 min, x4 min LE only RLE hip abd and flex in // bars 2x10 15# in w/c straight arm pulls 15# rows 15# biceps curls 10# chest press At Mat table  Seated trunk rotations blue ball 2x15  Blue ball  OHP 2x10  Horiz abd blue 2x15  06/12/23 NuStep L 5 x 7 min, x3 min LE only At Mat table   STM w/ thea gun to L lateral thigh  Blue ball  OHP 2x15  HS Curl green LLE x10, RLE 2x10  Horiz abd blue 2x10  LAQ LLE 5lb  2x15  Seated trunk rotations blue ball 2x15 PATIENT EDUCATION:  Education details: POC Person educated: Patient Education method: Explanation Education comprehension: verbalized understanding  HOME EXERCISE PROGRAM:  ASSESSMENT:  CLINICAL IMPRESSION: Had MRI and seen Careers adviser. [place]gen gave to ok to proceeded with procedure but patient not sure if he wasn't to go through with it. Pt is unsure if he can be compliant with protocol.  I am continuing to work on total body strength and function as well as educate him in safety, as he is impulsive and unsafe.     Patient is a 83 y.o. male who was seen today for physical therapy evaluation and treatment for repeated falls and balance issues, he has had 3 quadriceps tendon ruptures in the past year, his last rupture was not repaired so he has no active right knee extension.  He reports repeated falls, falling every few days. Pt I very impulsive and unsafe with transfers, he is no longer walking and is w/c dependent.   He will  benefit from skilled PT to decrease his risk for falls and address his overall strength, function and safety   OBJECTIVE IMPAIRMENTS: Abnormal gait, cardiopulmonary status limiting activity, decreased activity tolerance, decreased balance, decreased coordination, decreased endurance, decreased mobility, difficulty walking, decreased ROM, decreased strength, improper body mechanics, postural dysfunction, and poor safety.   REHAB POTENTIAL: Fair dependent on carry over and patient compliance  CLINICAL DECISION MAKING: Evolving/moderate complexity  EVALUATION COMPLEXITY: Moderate  GOALS: Goals reviewed with patient? Yes  SHORT TERM GOALS: Target date: 05/29/23  Patient will be independent with initial HEP. Goal status: Met  05/10/23  LONG TERM GOALS: Target date: 07/29/23  Patient will be independent with advanced/ongoing HEP to improve outcomes and carryover.  Goal status:ongoing 07/31/23  2.  Understand the need for safety with his transfers and all the steps to be as safe as possible Goal status: Ongoing 06/01/23  3.  Patient will demonstrate improved functional LE strength as demonstrated by < 25s on 5xSTS. Baseline: 83 seconds had to use hands and was unable to be independent with the standing Goal status: Progressing 07/31/23  4. Increase UE strength to 4+/5 for better transfers and function Goal status: Progressing  07/26/23  5.  Patient will report no falls in a 4 weeks period. Baseline: falls every few days Goal status: Ongoing 06/01/23 fall last "20 or 21" of Feburary, ongoing 07/31/23  PLAN:  PT FREQUENCY: 2x/week  PT DURATION: 12 weeks  PLANNED INTERVENTIONS: Therapeutic exercises, Therapeutic activity, Neuromuscular re-education, Balance training, Gait training, Patient/Family education, Self Care, Joint mobilization, Stair training, Cryotherapy, Moist heat, and Manual therapy.  PLAN FOR NEXT SESSION: needs total body strength to help with his safety in transfers and  independence. Try the brace and him activating glutes, may need to set up maintenance plan if they elect to not do surgery  Ollen Beverage, PTA

## 2023-08-22 ENCOUNTER — Ambulatory Visit: Admitting: Physical Therapy

## 2023-08-22 ENCOUNTER — Encounter: Payer: Self-pay | Admitting: Physical Therapy

## 2023-08-22 DIAGNOSIS — R296 Repeated falls: Secondary | ICD-10-CM

## 2023-08-22 DIAGNOSIS — R2689 Other abnormalities of gait and mobility: Secondary | ICD-10-CM | POA: Diagnosis not present

## 2023-08-22 DIAGNOSIS — M6281 Muscle weakness (generalized): Secondary | ICD-10-CM

## 2023-08-22 DIAGNOSIS — M6249 Contracture of muscle, multiple sites: Secondary | ICD-10-CM

## 2023-08-22 NOTE — Therapy (Signed)
 OUTPATIENT PHYSICAL THERAPY TREATMENT    Progress Note Reporting Period 07/20/23 to 08/22/23 for visits 21-30  See note below for Objective Data and Assessment of Progress/Goals.      Patient Name: Brandon Mendoza. MRN: [id number] DOB:1940/09/10, 83 y.o., male Today's Date: 08/22/2023  END OF SESSION:  PT End of Session - 08/22/23 0849     Visit Number 30    Date for PT Re-Evaluation 08/31/23    Authorization Type Medicare    PT Start Time 0842    PT Stop Time 0928    PT Time Calculation (min) 46 min    Activity Tolerance Patient tolerated treatment well    Behavior During Therapy [facility] for tasks assessed/performed;Impulsive             Past Medical History:  Diagnosis Date   Acquired clawfoot, right foot 12/16/2021   Anxiety    Atrophy of calf muscles 10/21/2021   Duplex calves was negative for significant blockage EMG was done at [facility] MRI lumbar spine was also done at [facility]   Carotid artery occlusion    CKD (chronic kidney disease)    Stage 3   COPD (chronic obstructive pulmonary disease) (HCC)    Depression    Dysrhythmia    PAF, atrial tachycardia   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   The spinal cord appears normal. 2.   No spinal stenosis. 3.   Mild multilevel degenerative changes as detailed above that do not lead to spinal stenosis or nerve root compression. 4.   T2 hyperintense foci within the pons consistent with chronic microvascular ischemic changes.   Loop - Medtronic Linq 10/22/2020 10/22/2020   Nocturia    Paroxysmal atrial fibrillation (HCC)    Peripheral neuropathy    Peripheral vascular disease (HCC)    Sleep apnea    on 6 cm, nasal pillow   Spinal stenosis    Stroke (HCC) 05/01/2011   ischemic   Weakness  generalized 10/21/2021   Severe, legs and arms   Past Surgical History:  Procedure Laterality Date   CAROTID ENDARTERECTOMY  12/09/11   Left cea   ENDARTERECTOMY  12/09/2011   Procedure: ENDARTERECTOMY CAROTID;  Surgeon: Mayo Speck, MD;  Location: [facility] OR;  Service: Vascular;  Laterality: Left;   EYE SURGERY     rt retina detachment   HAMMER TOE SURGERY  2004   HERNIA REPAIR  2006   I & D EXTREMITY Right 10/07/2022   Procedure: IRRIGATION AND DEBRIDEMENT EXTREMITY WITH WOUND CLOSURE;  Surgeon: Mort Ards, MD;  Location: [facility] ORS;  Service: Orthopedics;  Laterality: Right;   QUADRICEPS TENDON REPAIR Right 07/12/2022   Procedure: REPAIR QUADRICEP TENDON;  Surgeon: Claiborne Crew, MD;  Location: [facility];  Service: Orthopedics;  Laterality: Right;   QUADRICEPS TENDON REPAIR Right 08/30/2022   Procedure: REPAIR QUADRICEP TENDON;  Surgeon: Claiborne Crew, MD;  Location: [facility];  Service: Orthopedics;  Laterality: Right;   SHOULDER ARTHROSCOPY W/ ROTATOR CUFF REPAIR  2010   TONSILLECTOMY     Patient Active Problem List   Diagnosis Date Noted   Coronary artery calcification 05/09/2023   Rupture of quadriceps tendon 11/03/2022   Wound dehiscence 10/07/2022   Quadriceps  tendon rupture, right, subsequent encounter 08/30/2022   Quadriceps tendon rupture, right, initial encounter 07/12/2022   Unable to care for self 07/08/2022   Dehydration 07/08/2022   Depression 07/08/2022   COPD (chronic obstructive pulmonary disease) (HCC) 07/08/2022   Sialoadenitis of submandibular gland 04/28/2022   Macular degeneration 03/01/2022   Myofascial pain 02/10/2022   Lumbar spondylosis 01/03/2022   Acquired clawfoot, left foot 12/16/2021   Acquired clawfoot, right foot 12/16/2021   Claw foot 12/14/2021   Gait abnormality 12/10/2021   Spinal stenosis at L4-L5 level 12/10/2021   Ataxia 11/22/2021   Lightheadedness 11/02/2021   Claudication of lower extremity (HCC) 10/21/2021   Atrophy of calf muscles  10/21/2021   Weakness generalized 10/21/2021   Focal neurological deficit 10/21/2021   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   Pain in joint of right hip 09/14/2021   High arches 07/22/2021   Hammertoes of both feet 07/22/2021   Neuropathy 07/22/2021   Constipation 05/28/2021   Disequilibrium 05/28/2021   History of colonic polyps 05/28/2021   Renal cyst 05/28/2021   Gastroesophageal reflux disease without esophagitis 05/05/2021   Ingrowing left great toenail 02/15/2021   S/P lumbar fusion 12/24/2020   Spinal stenosis of lumbar region 12/04/2020   Acquired complex renal cyst 11/23/2020   Functional gait abnormality 11/04/2020   Idiopathic neuropathy 11/02/2020   Tinea pedis of both feet 10/28/2020   Loop - Medtronic Linq 10/22/2020 10/22/2020   Benign hypertension with 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) 10/05/2020   Fall with injury 10/05/2020   Hematoma 09/16/2020   Normocytic anemia 09/14/2020   Leukocytosis 09/14/2020   AF (paroxysmal atrial fibrillation) (HCC) 09/14/2020   Chronic anticoagulation 09/14/2020   Hyponatremia 09/14/2020   Pain in left foot 01/22/2020   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   Pain in joint of right knee 12/27/2019   Recurrent falls 11/17/2019   Paroxysmal atrial fibrillation (HCC) 10/22/2019   Pain in right foot 10/14/2019   Falls 10/07/2019   Ataxia involving legs 08/12/2019   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   Urge incontinence 04/13/2018   Urinary urgency 04/13/2018   Pain in left knee 03/01/2018   Other intervertebral disc degeneration, lumbar region 06/16/2017   Acquired bilateral hammer toes 10/05/2015   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   Generalized anxiety disorder 05/12/2015   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   History of left-sided carotid endarterectomy  04/10/2014   Essential hypertension 04/10/2014   Occlusion and stenosis of carotid artery without mention of cerebral infarction 12/15/2011   Habitual alcohol use 12/01/2011   Carotid artery stenosis, symptomatic 11/30/2011   Cerebral infarction (HCC) 11/29/2011   Mixed hyperlipidemia     PCP: Scherrie Curt  REFERRING PROVIDER: Scherrie Curt  REFERRING DIAG: weakness, multiple falls  Rationale for Evaluation and Treatment: Rehabilitation  THERAPY DIAG:  Other abnormalities of gait and mobility  Repeated falls  Contracture of muscle, multiple sites  Muscle weakness (generalized)  ONSET DATE: 10/07/22  SUBJECTIVE:  SUBJECTIVE STATEMENT: Surgeon said he could do the  surgery, but not sure of he wants to go through it. Patient has decided not to do the surgery  Doing all right, had one fall, fell in the shower yesterday. Did exercises before the showers, legs gave out  on him  PERTINENT HISTORY:  Cerebral infarct 10/21/21, lumbar fusion, recurrent falls, has significant neuropathy and this is the reason for most of his falls   PAIN:  Are you having pain? Yes: NPRS scale: 0/10 Pain location: right knee Pain description: ache  Aggravating factors: being on my feet  Relieving factors: movement  PRECAUTIONS: Fall  WEIGHT BEARING RESTRICTIONS: No  FALLS:  Has patient fallen in last 6 months? Yes. Number of falls every few days   LIVING ENVIRONMENT: Lives with: lives with their spouse Lives in: House/apartment Stairs: Yes: Internal: 20 steps; on right going up Has following equipment at home: Otho Blitz - 4 wheeled, Wheelchair (manual), shower chair, Tour manager, Grab bars, Ramped entry, and trekking pole  OCCUPATION: Retired  PLOF: Independent, prior to a year ago he was driving used  trekking poles to walk  PATIENT GOALS: live at home  NEXT MD VISIT:   OBJECTIVE:   DIAGNOSTIC FINDINGS:    LUMBAR SPINE FINDINGS: 1. Wide laminectomy and posterior pedicle screw and rod fixation at L4-5 without residual or recurrent stenosis. 2. Progressive adjacent level disease at L3-4 with moderate right and mild left subarticular narrowing and moderate foraminal stenosis bilaterally. Progression is predominantly due to facet disease 3. Progressive moderate facet hypertrophy at L1-2 with a progressive rightward disc protrusion resulting in progressive moderate right foraminal stenosis. Asymmetric endplate marrow edema on the right at L1-2 is consistent with progressive disease as well. 4. Otherwise stable degenerative change without other focal stenosis.   THORACIC SPINE FINDINGS:  On sagittal views the vertebral bodies have normal height and alignment.  Hemangioma within T9 vertebral body and slightly within T10 vertebral body.  Anterior kyphosis.  Mild scoliosis convex right centered at T8 level. The spinal cord is normal in size and appearance. The paraspinal soft tissues are unremarkable.     On axial views there is no spinal stenosis or foraminal narrowing.  Limited views of the aorta, kidneys, liver, lungs and paraspinal muscles are notable for multiple large right renal cysts ranging in size from 1.5 to 7 cm.   COGNITION: Overall cognitive status: Within functional limits for tasks assessed     SENSATION: WFL  MUSCLE LENGTH: Hamstrings: mod tightness on both sides  POSTURE: rounded shoulders and forward head  LOWER EXTREMITY ROM:   the right knee has HS, has no quad tendon on the right, unable to lift the leg, left leg WFL's , left hip and right hip and ankles WFL's   LOWER EXTREMITY MMT: 4-/5 for the LE's except for the right knee extension UPPER EXTREMITY MMT:  4-/5 for shoulders and elbows   FUNCTIONAL TESTS:  5XSTS: 83 seconds really has to use his hands  and is unsteady with standing Cannot stand on his own without holding onto something due to poor balance   Transfers:  needs set up and MinA for safety, is impulsive and unsafe at times. GAIT: He is w/c dependent    TODAY'S TREATMENT:  DATE:  08/22/23 Nustep level 6 x 6 minutes, then level 5 LE only x 5 minutes LEg curls 45# 2x15 left only 25# 2x10 LEft leg ext 10# 2x15 15# row, extension 10# chest press 15# biceps 20# triceps  08/18/23 Nustep level 6 x 6 minutes, LE only level 5 x 3 minutes Leg press 60lb 2x15 Chest press 10lb 2x15 Biceps Curls 10lb 2x10 Rows & Ext 15lb 2x15 At MAT  OHP blue ball 2x15  Seated trunk rotations blue ball   HS curls green 2x15 each 08/15/23 Nustep level 6 x 10 minutes In Pbars standing trying to get him to extend the right hip and have knee locked to bear weight on the right without the knee buckling LEg press 60# 2x15, then left only 40# Supine feet on ball K2C, rotation, bridges, isometric abs Passive stretch right knee Black tband clamshells Black tband right hip extension Passive stretch right knee into extension Bridges   08/10/23 AT MAT TABLE Seated sit up w/ OHP 2x15 Fiter press 2 blue 2x15 each Seated OHP 5lb 2x11 Seated trunk rotation w/ blue ball 2x15 Nustep level 6 x 8 minutes, LE only level 5 x 4 minutes  08/07/23 Nustep level 6 x 8 minutes, LE only level 5 x 4 minutes Leg curls 45# 2x15, then right only 20# 2x15 15# rows 15# extensions 10# chest press 20# biceps Supine feet on ball K2C, rotation, bridges, isometric abs Passive right knee extension Supine black tband hip extension bilaterally 2x15  08/02/23 Nustep level 6 x 8 minutes then LE only level 5 x 4 minutes Leg curls45#  both legs 20# right only Leg extension 15# left only Fittetr press 2 blue 2x15 15# shoulder ext. 2x15 15# rows  2x15 10# bicep curls 2x15 10 # chest press 2x15 5# shoulder press 3x10  07/31/23 Nustep level 6 x 8 minutes then LE only level 5 x 4 minutes Leg curls45#  both legs 20# right only Leg extension 10# left only 15# biceps curls 15# on pulleys rows 15# extensions in w/c Supine feet on ball bridges, rotation, isometric abs Passive right knee extension Black tband clamshells  07/26/23 Nustep level 6 x 8 minutes, no arms level 5 x 3 minutes In Pbars with brace locked trying to do some weight shifts and weight bearing on the right leg with him lifting the left Passive stretch right HS Seated pulley 15# each side rows and straight arm pulls 10# each pulley seated chest press Feet on ball K2C, rotation, bridges, isometric abs Brace locked right leg SLR's Side lying abduction Supine passive right knee extension Supine black tband hip extension Education of the patient  07/24/23 Nustep level 6 x 8 minutes, no arms level 5 x 3 minutes Leg curls 45# 2x15, then right only 25# 2x10 Leg extension 20# left only x10, then 15# x10 Had a brace on the right knee could not get it right to try w/b on the right  Feet on ball bridges, rotation, isometric abs Brace locked SLR, hip abduction  07/21/23 Nustep level 6 x 8 minutes, LE only L5 x 3 min Seated in w/c 15# rows, straight arm pulls 15lb  biceps 10lb 2x15 10# each pulley chest press At Mat table  Seated Crunches    W/ OHP yellow wball  Seated trunk rotatons bluw ba//  Horiz abd blue 2x10  HS curls blue 2x15  07/19/23 Nustep level 6 x 7 minutes Seated in w/c 15# rows, straight arm pulls, 15# biceps 2x15 10# each  pulley chest press 20# single leg curls 2x15 Supine feet on ball bridge, rotation, isometric abs Black tband clamshells Ball b/n knees squeeze with bridge Black tband hip extension  PATIENT EDUCATION:  Education details: POC Person educated: Patient Education method: Explanation Education comprehension: verbalized  understanding  HOME EXERCISE PROGRAM:  ASSESSMENT:  CLINICAL IMPRESSION: We again discussed with the patient the issues he has witht he left knee and it not holding him at times, he is thinking of not doing the surgery as he worries about the stress it will be on his wife and him having to be in a nursing home.  I have contacted our medicare person regarding how to set up maintenance program.  Had MRI and seen surgeon. Surgen gave to ok to proceeded with procedure but patient not sure if he wasn't to go through with it. Pt is unsure if he can be compliant with protocol.  I am continuing to work on total body strength and function as well as educate him in safety, as he is impulsive and unsafe.     Patient is a 83 y.o. male who was seen today for physical therapy evaluation and treatment for repeated falls and balance issues, he has had 3 quadriceps tendon ruptures in the past year, his last rupture was not repaired so he has no active right knee extension.  He reports repeated falls, falling every few days. Pt I very impulsive and unsafe with transfers, he is no longer walking and is w/c dependent.   He will benefit from skilled PT to decrease his risk for falls and address his overall strength, function and safety   OBJECTIVE IMPAIRMENTS: Abnormal gait, cardiopulmonary status limiting activity, decreased activity tolerance, decreased balance, decreased coordination, decreased endurance, decreased mobility, difficulty walking, decreased ROM, decreased strength, improper body mechanics, postural dysfunction, and poor safety.   REHAB POTENTIAL: Fair dependent on carry over and patient compliance  CLINICAL DECISION MAKING: Evolving/moderate complexity  EVALUATION COMPLEXITY: Moderate  GOALS: Goals reviewed with patient? Yes  SHORT TERM GOALS: Target date: 05/29/23  Patient will be independent with initial HEP. Goal status: Met  05/10/23  LONG TERM GOALS: Target date: 07/29/23  Patient will be  independent with advanced/ongoing HEP to improve outcomes and carryover.  Goal status:ongoing 07/31/23  2.  Understand the need for safety with his transfers and all the steps to be as safe as possible Goal status: Ongoing 06/01/23  3.  Patient will demonstrate improved functional LE strength as demonstrated by < 25s on 5xSTS. Baseline: 83 seconds had to use hands and was unable to be independent with the standing Goal status: Progressing 07/31/23  4. Increase UE strength to 4+/5 for better transfers and function Goal status: Progressing  07/26/23  5.  Patient will report no falls in a 4 weeks period. Baseline: falls every few days Goal status: Ongoing 06/01/23 fall last "20 or 21" of Feburary, ongoing 07/31/23  PLAN:  PT FREQUENCY: 2x/week  PT DURATION: 12 weeks  PLANNED INTERVENTIONS: Therapeutic exercises, Therapeutic activity, Neuromuscular re-education, Balance training, Gait training, Patient/Family education, Self Care, Joint mobilization, Stair training, Cryotherapy, Moist heat, and Manual therapy.  PLAN FOR NEXT SESSION: needs total body strength to help with his safety in transfers and independence. Try the brace and him activating glutes, may need to set up maintenance plan if they elect to not do surgery  Hollis Lurie, PT

## 2023-08-24 ENCOUNTER — Encounter: Payer: Self-pay | Admitting: Physical Therapy

## 2023-08-24 ENCOUNTER — Ambulatory Visit: Admitting: Physical Therapy

## 2023-08-24 DIAGNOSIS — M5459 Other low back pain: Secondary | ICD-10-CM

## 2023-08-24 DIAGNOSIS — R296 Repeated falls: Secondary | ICD-10-CM

## 2023-08-24 DIAGNOSIS — R2689 Other abnormalities of gait and mobility: Secondary | ICD-10-CM

## 2023-08-24 DIAGNOSIS — M6281 Muscle weakness (generalized): Secondary | ICD-10-CM

## 2023-08-24 DIAGNOSIS — M6249 Contracture of muscle, multiple sites: Secondary | ICD-10-CM

## 2023-08-24 DIAGNOSIS — M25561 Pain in right knee: Secondary | ICD-10-CM

## 2023-08-24 NOTE — Therapy (Signed)
 OUTPATIENT PHYSICAL THERAPY TREATMENT     Patient Name: Brandon Mendoza. MRN: [id number] DOB:[birth year], 83 y.o., male Today's Date: 08/24/2023  END OF SESSION:  PT End of Session - 08/24/23 0929     Visit Number 31    Date for PT Re-Evaluation 08/31/23    Authorization Type Medicare    PT Start Time 0927    PT Stop Time 1013    PT Time Calculation (min) 46 min    Activity Tolerance Patient tolerated treatment well    Behavior During Therapy [facility] for tasks assessed/performed;Impulsive             Past Medical History:  Diagnosis Date   Acquired clawfoot, right foot 12/16/2021   Anxiety    Atrophy of calf muscles 10/21/2021   Duplex calves was negative for significant blockage EMG was done at [facility] MRI lumbar spine was also done at [facility]   Carotid artery occlusion    CKD (chronic kidney disease)    Stage 3   COPD (chronic obstructive pulmonary disease) (HCC)    Depression    Dysrhythmia    PAF, atrial tachycardia   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   The spinal cord appears normal. 2.   No spinal stenosis. 3.   Mild multilevel degenerative changes as detailed above that do not lead to spinal stenosis or nerve root compression. 4.   T2 hyperintense foci within the pons consistent with chronic microvascular ischemic changes.   Loop - Medtronic Linq 10/22/2020 10/22/2020   Nocturia    Paroxysmal atrial fibrillation (HCC)    Peripheral neuropathy    Peripheral vascular disease (HCC)    Sleep apnea    on 6 cm, nasal pillow   Spinal stenosis    Stroke (HCC) 05/01/2011   ischemic   Weakness generalized 10/21/2021   Severe, legs and arms   Past Surgical History:  Procedure Laterality Date   CAROTID ENDARTERECTOMY  12/09/11   Left  cea   ENDARTERECTOMY  12/09/2011   Procedure: ENDARTERECTOMY CAROTID;  Surgeon: Mayo Speck, MD;  Location: [facility] OR;  Service: Vascular;  Laterality: Left;   EYE SURGERY     rt retina detachment   HAMMER TOE SURGERY  2004   HERNIA REPAIR  2006   I & D EXTREMITY Right 10/07/2022   Procedure: IRRIGATION AND DEBRIDEMENT EXTREMITY WITH WOUND CLOSURE;  Surgeon: Mort Ards, MD;  Location: [facility];  Service: Orthopedics;  Laterality: Right;   QUADRICEPS TENDON REPAIR Right 07/12/2022   Procedure: REPAIR QUADRICEP TENDON;  Surgeon: Claiborne Crew, MD;  Location: [facility];  Service: Orthopedics;  Laterality: Right;   QUADRICEPS TENDON REPAIR Right 08/30/2022   Procedure: REPAIR QUADRICEP TENDON;  Surgeon: Claiborne Crew, MD;  Location: [facility];  Service: Orthopedics;  Laterality: Right;   SHOULDER ARTHROSCOPY W/ ROTATOR CUFF REPAIR  2010   TONSILLECTOMY     Patient Active Problem List   Diagnosis Date Noted   Coronary artery calcification 05/09/2023   Rupture of quadriceps tendon 11/03/2022   Wound dehiscence 10/07/2022   Quadriceps tendon rupture, right, subsequent encounter 08/30/2022   Quadriceps tendon rupture, right, initial encounter 07/12/2022   Unable to care for self 07/08/2022  Dehydration 07/08/2022   Depression 07/08/2022   COPD (chronic obstructive pulmonary disease) (HCC) 07/08/2022   Sialoadenitis of submandibular gland 04/28/2022   Macular degeneration 03/01/2022   Myofascial pain 02/10/2022   Lumbar spondylosis 01/03/2022   Acquired clawfoot, left foot 12/16/2021   Acquired clawfoot, right foot 12/16/2021   Claw foot 12/14/2021   Gait abnormality 12/10/2021   Spinal stenosis at L4-L5 level 12/10/2021   Ataxia 11/22/2021   Lightheadedness 11/02/2021   Claudication of lower extremity (HCC) 10/21/2021   Atrophy of calf muscles 10/21/2021   Weakness generalized 10/21/2021   Focal neurological deficit 10/21/2021   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   Memory impairment of gradual onset  10/21/2021   Pain in joint of right hip 09/14/2021   High arches 07/22/2021   Hammertoes of both feet 07/22/2021   Neuropathy 07/22/2021   Constipation 05/28/2021   Disequilibrium 05/28/2021   History of colonic polyps 05/28/2021   Renal cyst 05/28/2021   Gastroesophageal reflux disease without esophagitis 05/05/2021   Ingrowing left great toenail 02/15/2021   S/P lumbar fusion 12/24/2020   Spinal stenosis of lumbar region 12/04/2020   Acquired complex renal cyst 11/23/2020   Functional gait abnormality 11/04/2020   Idiopathic neuropathy 11/02/2020   Tinea pedis of both feet 10/28/2020   Loop - Medtronic Linq 10/22/2020 10/22/2020   Benign hypertension with 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) 10/05/2020   Fall with injury 10/05/2020   Hematoma 09/16/2020   Normocytic anemia 09/14/2020   Leukocytosis 09/14/2020   AF (paroxysmal atrial fibrillation) (HCC) 09/14/2020   Chronic anticoagulation 09/14/2020   Hyponatremia 09/14/2020   Pain in left foot 01/22/2020   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   Pain in joint of right knee 12/27/2019   Recurrent falls 11/17/2019   Paroxysmal atrial fibrillation (HCC) 10/22/2019   Pain in right foot 10/14/2019   Falls 10/07/2019   Ataxia involving legs 08/12/2019   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   Urge incontinence 04/13/2018   Urinary urgency 04/13/2018   Pain in left knee 03/01/2018   Other intervertebral disc degeneration, lumbar region 06/16/2017   Acquired bilateral hammer toes 10/05/2015   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   Generalized anxiety disorder 05/12/2015   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   History of left-sided carotid endarterectomy 04/10/2014   Essential hypertension 04/10/2014   Occlusion and stenosis of carotid artery without mention of cerebral infarction 12/15/2011   Habitual  alcohol use 12/01/2011   Carotid artery stenosis, symptomatic 11/30/2011   Cerebral infarction (HCC) 11/29/2011   Mixed hyperlipidemia     PCP: Scherrie Curt  REFERRING PROVIDER: Scherrie Curt  REFERRING DIAG: weakness, multiple falls  Rationale for Evaluation and Treatment: Rehabilitation  THERAPY DIAG:  Other abnormalities of gait and mobility  Repeated falls  Muscle weakness (generalized)  Contracture of muscle, multiple sites  Acute pain of right knee  Other low back pain  ONSET DATE: 10/07/22  SUBJECTIVE:  SUBJECTIVE STATEMENT: Patient asking about plan, he reports that he is worried that he will not be able to stay independent and live at home if he stops, he has no equipment and with the right knee's inability to extend and the left knee having issues with buckling due to neuropathy, he is worried about more falls and the strain on his wife  Doing all right, had one fall, fell in the shower yesterday. Did exercises before the showers, legs gave out  on him  PERTINENT HISTORY:  Cerebral infarct 10/21/21, lumbar fusion, recurrent falls, has significant neuropathy and this is the reason for most of his falls   PAIN:  Are you having pain? Yes: NPRS scale: 0/10 Pain location: right knee Pain description: ache  Aggravating factors: being on my feet  Relieving factors: movement  PRECAUTIONS: Fall  WEIGHT BEARING RESTRICTIONS: No  FALLS:  Has patient fallen in last 6 months? Yes. Number of falls every few days   LIVING ENVIRONMENT: Lives with: lives with their spouse Lives in: House/apartment Stairs: Yes: Internal: 20 steps; on right going up Has following equipment at home: O[employer] Blitz - 4 wheeled, Wheelchair (manual), shower chair, Tour manager, Grab bars, Ramped entry, and trekking  pole  OCCUPATION: Retired  PLOF: Independent, prior to a year ago he was driving used trekking poles to walk  PATIENT GOALS: live at home  NEXT MD VISIT:   OBJECTIVE:   DIAGNOSTIC FINDINGS:    LUMBAR SPINE FINDINGS: 1. Wide laminectomy and posterior pedicle screw and rod fixation at L4-5 without residual or recurrent stenosis. 2. Progressive adjacent level disease at L3-4 with moderate right and mild left subarticular narrowing and moderate foraminal stenosis bilaterally. Progression is predominantly due to facet disease 3. Progressive moderate facet hypertrophy at L1-2 with a progressive rightward disc protrusion resulting in progressive moderate right foraminal stenosis. Asymmetric endplate marrow edema on the right at L1-2 is consistent with progressive disease as well. 4. Otherwise stable degenerative change without other focal stenosis.   THORACIC SPINE FINDINGS:  On sagittal views the vertebral bodies have normal height and alignment.  Hemangioma within T9 vertebral body and slightly within T10 vertebral body.  Anterior kyphosis.  Mild scoliosis convex right centered at T8 level. The spinal cord is normal in size and appearance. The paraspinal soft tissues are unremarkable.     On axial views there is no spinal stenosis or foraminal narrowing.  Limited views of the aorta, kidneys, liver, lungs and paraspinal muscles are notable for multiple large right renal cysts ranging in size from 1.5 to 7 cm.   COGNITION: Overall cognitive status: Within functional limits for tasks assessed     SENSATION: WFL  MUSCLE LENGTH: Hamstrings: mod tightness on both sides  POSTURE: rounded shoulders and forward head  LOWER EXTREMITY ROM:   the right knee has HS, has no quad tendon on the right, unable to lift the leg, left leg WFL's , left hip and right hip and ankles WFL's   LOWER EXTREMITY MMT: 4-/5 for the LE's except for the right knee extension UPPER EXTREMITY MMT:  4-/5 for  shoulders and elbows   FUNCTIONAL TESTS:  5XSTS: 83 seconds really has to use his hands and is unsteady with standing Cannot stand on his own without holding onto something due to poor balance   Transfers:  needs set up and MinA for safety, is impulsive and unsafe at times. GAIT: He is w/c dependent    TODAY'S TREATMENT:  DATE:  08/24/23 Nustep level 6 x 7 minutes 15# Rows 15# straight arm  15# chest press 15# biceps 20# triceps Passive stretch right knee into extension Feet on ball K2C, rotation, bridges, isometric abs Tband clamshells Ball b/n knees with bridge Black tband hip extension  08/22/23 Nustep level 6 x 6 minutes, then level 5 LE only x 5 minutes LEg curls 45# 2x15 left only 25# 2x10 LEft leg ext 10# 2x15 15# row, extension 10# chest press 15# biceps 20# triceps  08/18/23 Nustep level 6 x 6 minutes, LE only level 5 x 3 minutes Leg press 60lb 2x15 Chest press 10lb 2x15 Biceps Curls 10lb 2x10 Rows & Ext 15lb 2x15 At MAT  OHP blue ball 2x15  Seated trunk rotations blue ball   HS curls green 2x15 each 08/15/23 Nustep level 6 x 10 minutes In Pbars standing trying to get him to extend the right hip and have knee locked to bear weight on the right without the knee buckling LEg press 60# 2x15, then left only 40# Supine feet on ball K2C, rotation, bridges, isometric abs Passive stretch right knee Black tband clamshells Black tband right hip extension Passive stretch right knee into extension Bridges   08/10/23 AT MAT TABLE Seated sit up w/ OHP 2x15 Fiter press 2 blue 2x15 each Seated OHP 5lb 2x11 Seated trunk rotation w/ blue ball 2x15 Nustep level 6 x 8 minutes, LE only level 5 x 4 minutes  08/07/23 Nustep level 6 x 8 minutes, LE only level 5 x 4 minutes Leg curls 45# 2x15, then right only 20# 2x15 15# rows 15# extensions 10#  chest press 20# biceps Supine feet on ball K2C, rotation, bridges, isometric abs Passive right knee extension Supine black tband hip extension bilaterally 2x15  08/02/23 Nustep level 6 x 8 minutes then LE only level 5 x 4 minutes Leg curls45#  both legs 20# right only Leg extension 15# left only Fittetr press 2 blue 2x15 15# shoulder ext. 2x15 15# rows 2x15 10# bicep curls 2x15 10 # chest press 2x15 5# shoulder press 3x10  07/31/23 Nustep level 6 x 8 minutes then LE only level 5 x 4 minutes Leg curls45#  both legs 20# right only Leg extension 10# left only 15# biceps curls 15# on pulleys rows 15# extensions in w/c Supine feet on ball bridges, rotation, isometric abs Passive right knee extension Black tband clamshells  07/26/23 Nustep level 6 x 8 minutes, no arms level 5 x 3 minutes In Pbars with brace locked trying to do some weight shifts and weight bearing on the right leg with him lifting the left Passive stretch right HS Seated pulley 15# each side rows and straight arm pulls 10# each pulley seated chest press Feet on ball K2C, rotation, bridges, isometric abs Brace locked right leg SLR's Side lying abduction Supine passive right knee extension Supine black tband hip extension Education of the patient  07/24/23 Nustep level 6 x 8 minutes, no arms level 5 x 3 minutes Leg curls 45# 2x15, then right only 25# 2x10 Leg extension 20# left only x10, then 15# x10 Had a brace on the right knee could not get it right to try w/b on the right  Feet on ball bridges, rotation, isometric abs Brace locked SLR, hip abduction  07/21/23 Nustep level 6 x 8 minutes, LE only L5 x 3 min Seated in w/c 15# rows, straight arm pulls 15lb  biceps 10lb 2x15 10# each pulley chest press At Mat table  Seated Crunches    W/ OHP yellow wball  Seated trunk rotatons bluw ba//  Horiz abd blue 2x10  HS curls blue 2x15  07/19/23 Nustep level 6 x 7 minutes Seated in w/c 15# rows, straight arm  pulls, 15# biceps 2x15 10# each pulley chest press 20# single leg curls 2x15 Supine feet on ball bridge, rotation, isometric abs Black tband clamshells Ball b/n knees squeeze with bridge Black tband hip extension  PATIENT EDUCATION:  Education details: POC Person educated: Patient Education method: Explanation Education comprehension: verbalized understanding  HOME EXERCISE PROGRAM:  ASSESSMENT:  CLINICAL IMPRESSION: We continued to discuss the maintenance program, I have information on how to set this up and have educated the patient, however he has a lot of questions and I am working on answering these for him. In the long run he is impulsive and unsafe and cannot exercise on his own to maintain his level, he is at a high risk for falls and there is no gym that has the ability to assist him.   Had MRI and seen surgeon. Surgen gave to ok to proceeded with procedure but patient not sure if he wasn't to go through with it. Pt is unsure if he can be compliant with protocol.  I am continuing to work on total body strength and function as well as educate him in safety, as he is impulsive and unsafe.     Patient is a 83 y.o. male who was seen today for physical therapy evaluation and treatment for repeated falls and balance issues, he has had 3 quadriceps tendon ruptures in the past year, his last rupture was not repaired so he has no active right knee extension.  He reports repeated falls, falling every few days. Pt I very impulsive and unsafe with transfers, he is no longer walking and is w/c dependent.   He will benefit from skilled PT to decrease his risk for falls and address his overall strength, function and safety   OBJECTIVE IMPAIRMENTS: Abnormal gait, cardiopulmonary status limiting activity, decreased activity tolerance, decreased balance, decreased coordination, decreased endurance, decreased mobility, difficulty walking, decreased ROM, decreased strength, improper body mechanics,  postural dysfunction, and poor safety.   REHAB POTENTIAL: Fair dependent on carry over and patient compliance  CLINICAL DECISION MAKING: Evolving/moderate complexity  EVALUATION COMPLEXITY: Moderate  GOALS: Goals reviewed with patient? Yes  SHORT TERM GOALS: Target date: 05/29/23  Patient will be independent with initial HEP. Goal status: Met  05/10/23  LONG TERM GOALS: Target date: 07/29/23  Patient will be independent with advanced/ongoing HEP to improve outcomes and carryover.  Goal status:ongoing 07/31/23  2.  Understand the need for safety with his transfers and all the steps to be as safe as possible Goal status: Ongoing 06/01/23  3.  Patient will demonstrate improved functional LE strength as demonstrated by < 25s on 5xSTS. Baseline: 83 seconds had to use hands and was unable to be independent with the standing Goal status: Progressing 07/31/23  4. Increase UE strength to 4+/5 for better transfers and function Goal status: Progressing  07/26/23  5.  Patient will report no falls in a 4 weeks period. Baseline: falls every few days Goal status: Ongoing 06/01/23 fall last "20 or 21" of Feburary, ongoing 07/31/23  PLAN:  PT FREQUENCY: 2x/week  PT DURATION: 12 weeks  PLANNED INTERVENTIONS: Therapeutic exercises, Therapeutic activity, Neuromuscular re-education, Balance training, Gait training, Patient/Family education, Self Care, Joint mobilization, Stair training, Cryotherapy, Moist heat, and Manual therapy.  PLAN FOR NEXT  SESSION: working on the next level for Mr. Kehl being a maintenance program, will work to get this all set up to Medicare guidelines   Hollis Lurie, PT

## 2023-08-29 ENCOUNTER — Ambulatory Visit: Attending: Internal Medicine | Admitting: Physical Therapy

## 2023-08-29 ENCOUNTER — Encounter: Payer: Self-pay | Admitting: Physical Therapy

## 2023-08-29 DIAGNOSIS — M6249 Contracture of muscle, multiple sites: Secondary | ICD-10-CM | POA: Insufficient documentation

## 2023-08-29 DIAGNOSIS — R2689 Other abnormalities of gait and mobility: Secondary | ICD-10-CM | POA: Diagnosis present

## 2023-08-29 DIAGNOSIS — M25561 Pain in right knee: Secondary | ICD-10-CM | POA: Insufficient documentation

## 2023-08-29 DIAGNOSIS — M6281 Muscle weakness (generalized): Secondary | ICD-10-CM | POA: Insufficient documentation

## 2023-08-29 DIAGNOSIS — R296 Repeated falls: Secondary | ICD-10-CM | POA: Insufficient documentation

## 2023-08-29 NOTE — Therapy (Signed)
 OUTPATIENT PHYSICAL THERAPY TREATMENT     Patient Name: Brandon Mendoza. MRN: [id number] DOB:[birth year], 83 y.o., male Today's Date: 08/29/2023  END OF SESSION:  PT End of Session - 08/29/23 0842     Visit Number 32    Date for PT Re-Evaluation 08/31/23    Authorization Type Medicare    PT Start Time 0842    PT Stop Time 0928    PT Time Calculation (min) 46 min    Activity Tolerance Patient tolerated treatment well    Behavior During Therapy [facility] for tasks assessed/performed;Impulsive             Past Medical History:  Diagnosis Date   Acquired clawfoot, right foot 12/16/2021   Anxiety    Atrophy of calf muscles 10/21/2021   Duplex calves was negative for significant blockage EMG was done at [facility] MRI lumbar spine was also done at [facility]   Carotid artery occlusion    CKD (chronic kidney disease)    Stage 3   COPD (chronic obstructive pulmonary disease) (HCC)    Depression    Dysrhythmia    PAF, atrial tachycardia   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   The spinal cord appears normal. 2.   No spinal stenosis. 3.   Mild multilevel degenerative changes as detailed above that do not lead to spinal stenosis or nerve root compression. 4.   T2 hyperintense foci within the pons consistent with chronic microvascular ischemic changes.   Loop - Medtronic Linq 10/22/2020 10/22/2020   Nocturia    Paroxysmal atrial fibrillation (HCC)    Peripheral neuropathy    Peripheral vascular disease (HCC)    Sleep apnea    on 6 cm, nasal pillow   Spinal stenosis    Stroke (HCC) 05/01/2011   ischemic   Weakness generalized 10/21/2021   Severe, legs and arms   Past Surgical History:  Procedure Laterality Date   CAROTID ENDARTERECTOMY  12/09/11   Left  cea   ENDARTERECTOMY  12/09/2011   Procedure: ENDARTERECTOMY CAROTID;  Surgeon: Mayo Speck, MD;  Location: [facility];  Service: Vascular;  Laterality: Left;   EYE SURGERY     rt retina detachment   HAMMER TOE SURGERY  2004   HERNIA REPAIR  2006   I & D EXTREMITY Right 10/07/2022   Procedure: IRRIGATION AND DEBRIDEMENT EXTREMITY WITH WOUND CLOSURE;  Surgeon: Mort Ards, MD;  Location: [facility];  Service: Orthopedics;  Laterality: Right;   QUADRICEPS TENDON REPAIR Right 07/12/2022   Procedure: REPAIR QUADRICEP TENDON;  Surgeon: Claiborne Crew, MD;  Location: [facility];  Service: Orthopedics;  Laterality: Right;   QUADRICEPS TENDON REPAIR Right 08/30/2022   Procedure: REPAIR QUADRICEP TENDON;  Surgeon: Claiborne Crew, MD;  Location: [facility];  Service: Orthopedics;  Laterality: Right;   SHOULDER ARTHROSCOPY W/ ROTATOR CUFF REPAIR  2010   TONSILLECTOMY     Patient Active Problem List   Diagnosis Date Noted   Coronary artery calcification 05/09/2023   Rupture of quadriceps tendon 11/03/2022   Wound dehiscence 10/07/2022   Quadriceps tendon rupture, right, subsequent encounter 08/30/2022   Quadriceps tendon rupture, right, initial encounter 07/12/2022   Unable to care for self 07/08/2022  Dehydration 07/08/2022   Depression 07/08/2022   COPD (chronic obstructive pulmonary disease) (HCC) 07/08/2022   Sialoadenitis of submandibular gland 04/28/2022   Macular degeneration 03/01/2022   Myofascial pain 02/10/2022   Lumbar spondylosis 01/03/2022   Acquired clawfoot, left foot 12/16/2021   Acquired clawfoot, right foot 12/16/2021   Claw foot 12/14/2021   Gait abnormality 12/10/2021   Spinal stenosis at L4-L5 level 12/10/2021   Ataxia 11/22/2021   Lightheadedness 11/02/2021   Claudication of lower extremity (HCC) 10/21/2021   Atrophy of calf muscles 10/21/2021   Weakness generalized 10/21/2021   Focal neurological deficit 10/21/2021   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   Memory impairment of gradual onset  10/21/2021   Pain in joint of right hip 09/14/2021   High arches 07/22/2021   Hammertoes of both feet 07/22/2021   Neuropathy 07/22/2021   Constipation 05/28/2021   Disequilibrium 05/28/2021   History of colonic polyps 05/28/2021   Renal cyst 05/28/2021   Gastroesophageal reflux disease without esophagitis 05/05/2021   Ingrowing left great toenail 02/15/2021   S/P lumbar fusion 12/24/2020   Spinal stenosis of lumbar region 12/04/2020   Acquired complex renal cyst 11/23/2020   Functional gait abnormality 11/04/2020   Idiopathic neuropathy 11/02/2020   Tinea pedis of both feet 10/28/2020   Loop - Medtronic Linq 10/22/2020 10/22/2020   Benign hypertension with 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) 10/05/2020   Fall with injury 10/05/2020   Hematoma 09/16/2020   Normocytic anemia 09/14/2020   Leukocytosis 09/14/2020   AF (paroxysmal atrial fibrillation) (HCC) 09/14/2020   Chronic anticoagulation 09/14/2020   Hyponatremia 09/14/2020   Pain in left foot 01/22/2020   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   Pain in joint of right knee 12/27/2019   Recurrent falls 11/17/2019   Paroxysmal atrial fibrillation (HCC) 10/22/2019   Pain in right foot 10/14/2019   Falls 10/07/2019   Ataxia involving legs 08/12/2019   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   Urge incontinence 04/13/2018   Urinary urgency 04/13/2018   Pain in left knee 03/01/2018   Other intervertebral disc degeneration, lumbar region 06/16/2017   Acquired bilateral hammer toes 10/05/2015   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   Generalized anxiety disorder 05/12/2015   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   History of left-sided carotid endarterectomy 04/10/2014   Essential hypertension 04/10/2014   Occlusion and stenosis of carotid artery without mention of cerebral infarction 12/15/2011   Habitual  alcohol use 12/01/2011   Carotid artery stenosis, symptomatic 11/30/2011   Cerebral infarction (HCC) 11/29/2011   Mixed hyperlipidemia     PCP: Scherrie Curt  REFERRING PROVIDER: Scherrie Curt  REFERRING DIAG: weakness, multiple falls  Rationale for Evaluation and Treatment: Rehabilitation  THERAPY DIAG:  Other abnormalities of gait and mobility  Repeated falls  Muscle weakness (generalized)  Contracture of muscle, multiple sites  Acute pain of right knee  ONSET DATE: 10/07/22  SUBJECTIVE:  SUBJECTIVE STATEMENT: Reports that he had a fall over the weekend, reports fell forward with transferring  Patient asking about plan, he reports that he is worried that he will not be able to stay independent and live at home if he stops, he has no equipment and with the right knee's inability to extend and the left knee having issues with buckling due to neuropathy, he is worried about more falls and the strain on his wife  Doing all right, had one fall, fell in the shower yesterday. Did exercises before the showers, legs gave out  on him  PERTINENT HISTORY:  Cerebral infarct 10/21/21, lumbar fusion, recurrent falls, has significant neuropathy and this is the reason for most of his falls   PAIN:  Are you having pain? Yes: NPRS scale: 0/10 Pain location: right knee Pain description: ache  Aggravating factors: being on my feet  Relieving factors: movement  PRECAUTIONS: Fall  WEIGHT BEARING RESTRICTIONS: No  FALLS:  Has patient fallen in last 6 months? Yes. Number of falls every few days   LIVING ENVIRONMENT: Lives with: lives with their spouse Lives in: House/apartment Stairs: Yes: Internal: 20 steps; on right going up Has following equipment at home: [employer] - 4 wheeled, Wheelchair (manual),  shower chair, Tour manager, Grab bars, Ramped entry, and trekking pole  OCCUPATION: Retired  PLOF: Independent, prior to a year ago he was driving used trekking poles to walk  PATIENT GOALS: live at home  NEXT MD VISIT:   OBJECTIVE:   DIAGNOSTIC FINDINGS:    LUMBAR SPINE FINDINGS: 1. Wide laminectomy and posterior pedicle screw and rod fixation at L4-5 without residual or recurrent stenosis. 2. Progressive adjacent level disease at L3-4 with moderate right and mild left subarticular narrowing and moderate foraminal stenosis bilaterally. Progression is predominantly due to facet disease 3. Progressive moderate facet hypertrophy at L1-2 with a progressive rightward disc protrusion resulting in progressive moderate right foraminal stenosis. Asymmetric endplate marrow edema on the right at L1-2 is consistent with progressive disease as well. 4. Otherwise stable degenerative change without other focal stenosis.   THORACIC SPINE FINDINGS:  On sagittal views the vertebral bodies have normal height and alignment.  Hemangioma within T9 vertebral body and slightly within T10 vertebral body.  Anterior kyphosis.  Mild scoliosis convex right centered at T8 level. The spinal cord is normal in size and appearance. The paraspinal soft tissues are unremarkable.     On axial views there is no spinal stenosis or foraminal narrowing.  Limited views of the aorta, kidneys, liver, lungs and paraspinal muscles are notable for multiple large right renal cysts ranging in size from 1.5 to 7 cm.   COGNITION: Overall cognitive status: Within functional limits for tasks assessed     SENSATION: WFL  MUSCLE LENGTH: Hamstrings: mod tightness on both sides  POSTURE: rounded shoulders and forward head  LOWER EXTREMITY ROM:   the right knee has HS, has no quad tendon on the right, unable to lift the leg, left leg WFL's , left hip and right hip and ankles WFL's   LOWER EXTREMITY MMT: 4-/5 for the LE's  except for the right knee extension UPPER EXTREMITY MMT:  4-/5 for shoulders and elbows   FUNCTIONAL TESTS:  5XSTS: 83 seconds really has to use his hands and is unsteady with standing Cannot stand on his own without holding onto something due to poor balance   Transfers:  needs set up and MinA for safety, is impulsive and unsafe at times. GAIT: He  is w/c dependent    TODAY'S TREATMENT:                                                                                                                              DATE:  08/29/23 Nustep level 6 x 5 minutes with arms and then 7 minutes without arms 15# rows 15# lats 15# chest press 15# biceps 20# triceps 55# leg curls , left only 25# Supine feet on ball, bridge, isometric abs Black tband clamshells Education on safety  08/24/23 Nustep level 6 x 7 minutes 15# Rows 15# straight arm  15# chest press 15# biceps 20# triceps Passive stretch right knee into extension Feet on ball K2C, rotation, bridges, isometric abs Tband clamshells Ball b/n knees with bridge Black tband hip extension  08/22/23 Nustep level 6 x 6 minutes, then level 5 LE only x 5 minutes LEg curls 45# 2x15 left only 25# 2x10 LEft leg ext 10# 2x15 15# row, extension 10# chest press 15# biceps 20# triceps  08/18/23 Nustep level 6 x 6 minutes, LE only level 5 x 3 minutes Leg press 60lb 2x15 Chest press 10lb 2x15 Biceps Curls 10lb 2x10 Rows & Ext 15lb 2x15 At MAT  OHP blue ball 2x15  Seated trunk rotations blue ball   HS curls green 2x15 each 08/15/23 Nustep level 6 x 10 minutes In Pbars standing trying to get him to extend the right hip and have knee locked to bear weight on the right without the knee buckling LEg press 60# 2x15, then left only 40# Supine feet on ball K2C, rotation, bridges, isometric abs Passive stretch right knee Black tband clamshells Black tband right hip extension Passive stretch right knee into extension Bridges   08/10/23 AT  MAT TABLE Seated sit up w/ OHP 2x15 Fiter press 2 blue 2x15 each Seated OHP 5lb 2x11 Seated trunk rotation w/ blue ball 2x15 Nustep level 6 x 8 minutes, LE only level 5 x 4 minutes  08/07/23 Nustep level 6 x 8 minutes, LE only level 5 x 4 minutes Leg curls 45# 2x15, then right only 20# 2x15 15# rows 15# extensions 10# chest press 20# biceps Supine feet on ball K2C, rotation, bridges, isometric abs Passive right knee extension Supine black tband hip extension bilaterally 2x15  08/02/23 Nustep level 6 x 8 minutes then LE only level 5 x 4 minutes Leg curls45#  both legs 20# right only Leg extension 15# left only Fittetr press 2 blue 2x15 15# shoulder ext. 2x15 15# rows 2x15 10# bicep curls 2x15 10 # chest press 2x15 5# shoulder press 3x10  07/31/23 Nustep level 6 x 8 minutes then LE only level 5 x 4 minutes Leg curls45#  both legs 20# right only Leg extension 10# left only 15# biceps curls 15# on pulleys rows 15# extensions in w/c Supine feet on ball bridges, rotation, isometric abs Passive right knee extension Black tband clamshells  07/26/23 Nustep level 6 x 8 minutes,  no arms level 5 x 3 minutes In Pbars with brace locked trying to do some weight shifts and weight bearing on the right leg with him lifting the left Passive stretch right HS Seated pulley 15# each side rows and straight arm pulls 10# each pulley seated chest press Feet on ball K2C, rotation, bridges, isometric abs Brace locked right leg SLR's Side lying abduction Supine passive right knee extension Supine black tband hip extension Education of the patient  07/24/23 Nustep level 6 x 8 minutes, no arms level 5 x 3 minutes Leg curls 45# 2x15, then right only 25# 2x10 Leg extension 20# left only x10, then 15# x10 Had a brace on the right knee could not get it right to try w/b on the right  Feet on ball bridges, rotation, isometric abs Brace locked SLR, hip abduction  07/21/23 Nustep level 6 x 8 minutes,  LE only L5 x 3 min Seated in w/c 15# rows, straight arm pulls 15lb  biceps 10lb 2x15 10# each pulley chest press At Mat table  Seated Crunches    W/ OHP yellow wball  Seated trunk rotatons bluw ba//  Horiz abd blue 2x10  HS curls blue 2x15  07/19/23 Nustep level 6 x 7 minutes Seated in w/c 15# rows, straight arm pulls, 15# biceps 2x15 10# each pulley chest press 20# single leg curls 2x15 Supine feet on ball bridge, rotation, isometric abs Black tband clamshells Ball b/n knees squeeze with bridge Black tband hip extension  PATIENT EDUCATION:  Education details: POC Person educated: Patient Education method: Explanation Education comprehension: verbalized understanding  HOME EXERCISE PROGRAM:  ASSESSMENT:  CLINICAL IMPRESSION: We continued to discuss the maintenance program, I have information on how to set this up and have educated the patient, however he has a lot of questions and I am working on answering these for him. In the long run he is impulsive and unsafe and cannot exercise on his own to maintain his level, he is at a high risk for falls and there is no gym that has the ability to assist him. I again talked with him about the safety issues, again today he tried to take a step putting all weight on the right leg that had no patellar tendon and it buckled if PT had not been around he would have fallen, he usually blows this off saying "well I know you are there, I am safer at home"  Had MRI and seen surgeon. Surgen gave to ok to proceeded with procedure but patient not sure if he wasn't to go through with it. Pt is unsure if he can be compliant with protocol.  I am continuing to work on total body strength and function as well as educate him in safety, as he is impulsive and unsafe.     Patient is a 83 y.o. male who was seen today for physical therapy evaluation and treatment for repeated falls and balance issues, he has had 3 quadriceps tendon ruptures in the past year, his  last rupture was not repaired so he has no active right knee extension.  He reports repeated falls, falling every few days. Pt I very impulsive and unsafe with transfers, he is no longer walking and is w/c dependent.   He will benefit from skilled PT to decrease his risk for falls and address his overall strength, function and safety   OBJECTIVE IMPAIRMENTS: Abnormal gait, cardiopulmonary status limiting activity, decreased activity tolerance, decreased balance, decreased coordination, decreased endurance, decreased mobility, difficulty  walking, decreased ROM, decreased strength, improper body mechanics, postural dysfunction, and poor safety.   REHAB POTENTIAL: Fair dependent on carry over and patient compliance  CLINICAL DECISION MAKING: Evolving/moderate complexity  EVALUATION COMPLEXITY: Moderate  GOALS: Goals reviewed with patient? Yes  SHORT TERM GOALS: Target date: 05/29/23  Patient will be independent with initial HEP. Goal status: Met  05/10/23  LONG TERM GOALS: Target date: 07/29/23  Patient will be independent with advanced/ongoing HEP to improve outcomes and carryover.  Goal status:ongoing 07/31/23  2.  Understand the need for safety with his transfers and all the steps to be as safe as possible Goal status: Ongoing 06/01/23  3.  Patient will demonstrate improved functional LE strength as demonstrated by < 25s on 5xSTS. Baseline: 83 seconds had to use hands and was unable to be independent with the standing Goal status: Progressing 07/31/23  4. Increase UE strength to 4+/5 for better transfers and function Goal status: Progressing  07/26/23  5.  Patient will report no falls in a 4 weeks period. Baseline: falls every few days Goal status: Ongoing 06/01/23 fall last "20 or 21" of Feburary, ongoing 07/31/23  PLAN:  PT FREQUENCY: 2x/week  PT DURATION: 12 weeks  PLANNED INTERVENTIONS: Therapeutic exercises, Therapeutic activity, Neuromuscular re-education, Balance training, Gait  training, Patient/Family education, Self Care, Joint mobilization, Stair training, Cryotherapy, Moist heat, and Manual therapy.  PLAN FOR NEXT SESSION: working on the next level for Mr. Demetro being a maintenance program, will work to get this all set up to Medicare guidelines next visit renewal with maintenance program one time a week  Hollis Lurie, PT

## 2023-08-31 ENCOUNTER — Ambulatory Visit: Admitting: Physical Therapy

## 2023-08-31 ENCOUNTER — Encounter: Payer: Self-pay | Admitting: Physical Therapy

## 2023-08-31 DIAGNOSIS — M6249 Contracture of muscle, multiple sites: Secondary | ICD-10-CM

## 2023-08-31 DIAGNOSIS — R296 Repeated falls: Secondary | ICD-10-CM

## 2023-08-31 DIAGNOSIS — R2689 Other abnormalities of gait and mobility: Secondary | ICD-10-CM | POA: Diagnosis not present

## 2023-08-31 DIAGNOSIS — M6281 Muscle weakness (generalized): Secondary | ICD-10-CM

## 2023-08-31 NOTE — Therapy (Signed)
 OUTPATIENT PHYSICAL THERAPY TREATMENT     Patient Name: Brandon Mendoza. MRN: [id number] DOB:03-24-1941, 83 y.o., male Today's Date: 08/31/2023  END OF SESSION:  PT End of Session - 08/31/23 0851     Visit Number 33    Date for PT Re-Evaluation 08/31/23    PT Start Time 0845    PT Stop Time 0930    PT Time Calculation (min) 45 min    Activity Tolerance Patient tolerated treatment well    Behavior During Therapy [facility] for tasks assessed/performed;Impulsive             Past Medical History:  Diagnosis Date   Acquired clawfoot, right foot 12/16/2021   Anxiety    Atrophy of calf muscles 10/21/2021   Duplex calves was negative for significant blockage EMG was done at [facility] MRI lumbar spine was also done at [facility]   Carotid artery occlusion    CKD (chronic kidney disease)    Stage 3   COPD (chronic obstructive pulmonary disease) (HCC)    Depression    Dysrhythmia    PAF, atrial tachycardia   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   The spinal cord appears normal. 2.   No spinal stenosis. 3.   Mild multilevel degenerative changes as detailed above that do not lead to spinal stenosis or nerve root compression. 4.   T2 hyperintense foci within the pons consistent with chronic microvascular ischemic changes.   Loop - Medtronic Linq 10/22/2020 10/22/2020   Nocturia    Paroxysmal atrial fibrillation (HCC)    Peripheral neuropathy    Peripheral vascular disease (HCC)    Sleep apnea    on 6 cm, nasal pillow   Spinal stenosis    Stroke (HCC) 05/01/2011   ischemic   Weakness generalized 10/21/2021   Severe, legs and arms   Past Surgical History:  Procedure Laterality Date   CAROTID ENDARTERECTOMY  12/09/11   Left cea   ENDARTERECTOMY  12/09/2011    Procedure: ENDARTERECTOMY CAROTID;  Surgeon: Mayo Speck, MD;  Location: [facility] OR;  Service: Vascular;  Laterality: Left;   EYE SURGERY     rt retina detachment   HAMMER TOE SURGERY  2004   HERNIA REPAIR  2006   I & D EXTREMITY Right 10/07/2022   Procedure: IRRIGATION AND DEBRIDEMENT EXTREMITY WITH WOUND CLOSURE;  Surgeon: Mort Ards, MD;  Location: [facility] ORS;  Service: Orthopedics;  Laterality: Right;   QUADRICEPS TENDON REPAIR Right 07/12/2022   Procedure: REPAIR QUADRICEP TENDON;  Surgeon: Claiborne Crew, MD;  Location: [facility] ORS;  Service: Orthopedics;  Laterality: Right;   QUADRICEPS TENDON REPAIR Right 08/30/2022   Procedure: REPAIR QUADRICEP TENDON;  Surgeon: Claiborne Crew, MD;  Location: [facility] ORS;  Service: Orthopedics;  Laterality: Right;   SHOULDER ARTHROSCOPY W/ ROTATOR CUFF REPAIR  2010   TONSILLECTOMY     Patient Active Problem List   Diagnosis Date Noted   Coronary artery calcification 05/09/2023   Rupture of quadriceps tendon 11/03/2022   Wound dehiscence 10/07/2022   Quadriceps tendon rupture, right, subsequent encounter 08/30/2022   Quadriceps tendon rupture, right, initial encounter 07/12/2022   Unable to care for self 07/08/2022   Dehydration 07/08/2022   Depression 07/08/2022  COPD (chronic obstructive pulmonary disease) (HCC) 07/08/2022   Sialoadenitis of submandibular gland 04/28/2022   Macular degeneration 03/01/2022   Myofascial pain 02/10/2022   Lumbar spondylosis 01/03/2022   Acquired clawfoot, left foot 12/16/2021   Acquired clawfoot, right foot 12/16/2021   Claw foot 12/14/2021   Gait abnormality 12/10/2021   Spinal stenosis at L4-L5 level 12/10/2021   Ataxia 11/22/2021   Lightheadedness 11/02/2021   Claudication of lower extremity (HCC) 10/21/2021   Atrophy of calf muscles 10/21/2021   Weakness generalized 10/21/2021   Focal neurological deficit 10/21/2021   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   Pain in joint of right hip  09/14/2021   High arches 07/22/2021   Hammertoes of both feet 07/22/2021   Neuropathy 07/22/2021   Constipation 05/28/2021   Disequilibrium 05/28/2021   History of colonic polyps 05/28/2021   Renal cyst 05/28/2021   Gastroesophageal reflux disease without esophagitis 05/05/2021   Ingrowing left great toenail 02/15/2021   S/P lumbar fusion 12/24/2020   Spinal stenosis of lumbar region 12/04/2020   Acquired complex renal cyst 11/23/2020   Functional gait abnormality 11/04/2020   Idiopathic neuropathy 11/02/2020   Tinea pedis of both feet 10/28/2020   Loop - Medtronic Linq 10/22/2020 10/22/2020   Benign hypertension with 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) 10/05/2020   Fall with injury 10/05/2020   Hematoma 09/16/2020   Normocytic anemia 09/14/2020   Leukocytosis 09/14/2020   AF (paroxysmal atrial fibrillation) (HCC) 09/14/2020   Chronic anticoagulation 09/14/2020   Hyponatremia 09/14/2020   Pain in left foot 01/22/2020   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   Pain in joint of right knee 12/27/2019   Recurrent falls 11/17/2019   Paroxysmal atrial fibrillation (HCC) 10/22/2019   Pain in right foot 10/14/2019   Falls 10/07/2019   Ataxia involving legs 08/12/2019   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   Urge incontinence 04/13/2018   Urinary urgency 04/13/2018   Pain in left knee 03/01/2018   Other intervertebral disc degeneration, lumbar region 06/16/2017   Acquired bilateral hammer toes 10/05/2015   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   Generalized anxiety disorder 05/12/2015   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   History of left-sided carotid endarterectomy 04/10/2014   Essential hypertension 04/10/2014   Occlusion and stenosis of carotid artery without mention of cerebral infarction 12/15/2011   Habitual alcohol use 12/01/2011   Carotid artery  stenosis, symptomatic 11/30/2011   Cerebral infarction (HCC) 11/29/2011   Mixed hyperlipidemia     PCP: Scherrie Curt  REFERRING PROVIDER: Scherrie Curt  REFERRING DIAG: weakness, multiple falls  Rationale for Evaluation and Treatment: Rehabilitation  THERAPY DIAG:  Other abnormalities of gait and mobility  Repeated falls  Muscle weakness (generalized)  Contracture of muscle, multiple sites  ONSET DATE: 10/07/22  SUBJECTIVE:  SUBJECTIVE STATEMENT: OK, pain in the L hip when over moved  Patient asking about plan, he reports that he is worried that he will not be able to stay independent and live at home if he stops, he has no equipment and with the right knee's inability to extend and the left knee having issues with buckling due to neuropathy, he is worried about more falls and the strain on his wife  Doing all right, had one fall, fell in the shower yesterday. Did exercises before the showers, legs gave out  on him  PERTINENT HISTORY:  Cerebral infarct 10/21/21, lumbar fusion, recurrent falls, has significant neuropathy and this is the reason for most of his falls   PAIN:  Are you having pain? Yes: NPRS scale: 0/10 Pain location: right knee Pain description: ache  Aggravating factors: being on my feet  Relieving factors: movement  PRECAUTIONS: Fall  WEIGHT BEARING RESTRICTIONS: No  FALLS:  Has patient fallen in last 6 months? Yes. Number of falls every few days   LIVING ENVIRONMENT: Lives with: lives with their spouse Lives in: House/apartment Stairs: Yes: Internal: 20 steps; on right going up Has following equipment at home: [employer]itz - 4 wheeled, Wheelchair (manual), shower chair, Tour manager, Grab bars, Ramped entry, and trekking pole  OCCUPATION: Retired  PLOF: Independent,  prior to a year ago he was driving used trekking poles to walk  PATIENT GOALS: live at home  NEXT MD VISIT:   OBJECTIVE:   DIAGNOSTIC FINDINGS:    LUMBAR SPINE FINDINGS: 1. Wide laminectomy and posterior pedicle screw and rod fixation at L4-5 without residual or recurrent stenosis. 2. Progressive adjacent level disease at L3-4 with moderate right and mild left subarticular narrowing and moderate foraminal stenosis bilaterally. Progression is predominantly due to facet disease 3. Progressive moderate facet hypertrophy at L1-2 with a progressive rightward disc protrusion resulting in progressive moderate right foraminal stenosis. Asymmetric endplate marrow edema on the right at L1-2 is consistent with progressive disease as well. 4. Otherwise stable degenerative change without other focal stenosis.   THORACIC SPINE FINDINGS:  On sagittal views the vertebral bodies have normal height and alignment.  Hemangioma within T9 vertebral body and slightly within T10 vertebral body.  Anterior kyphosis.  Mild scoliosis convex right centered at T8 level. The spinal cord is normal in size and appearance. The paraspinal soft tissues are unremarkable.     On axial views there is no spinal stenosis or foraminal narrowing.  Limited views of the aorta, kidneys, liver, lungs and paraspinal muscles are notable for multiple large right renal cysts ranging in size from 1.5 to 7 cm.   COGNITION: Overall cognitive status: Within functional limits for tasks assessed     SENSATION: WFL  MUSCLE LENGTH: Hamstrings: mod tightness on both sides  POSTURE: rounded shoulders and forward head  LOWER EXTREMITY ROM:   the right knee has HS, has no quad tendon on the right, unable to lift the leg, left leg WFL's , left hip and right hip and ankles WFL's   LOWER EXTREMITY MMT: 4-/5 for the LE's except for the right knee extension UPPER EXTREMITY MMT:  4-/5 for shoulders and elbows   FUNCTIONAL TESTS:   5XSTS: 83 seconds really has to use his hands and is unsteady with standing Cannot stand on his own without holding onto something due to poor balance   Transfers:  needs set up and MinA for safety, is impulsive and unsafe at times. GAIT: He is w/c dependent  TODAY'S TREATMENT:                                                                                                                              DATE:  08/31/23 Nustep level 6 x 7 minutes with arms and then 4 minutes without arms 15# rows Lats 20lb 15# chest press 15# biceps 20# triceps 55# leg curls , left only 20# Leg Ext 10lb LLE 2x15 LLE leg Ext 10lb 2x15 Seated hip abd black band   08/29/23 Nustep level 6 x 8 minutes with arms and then 4 minutes without arms 15# rows 15# lats 15# chest press 15# biceps 20# triceps 55# leg curls , left only 25# Supine feet on ball, bridge, isometric abs Black tband clamshells Education on safety  08/24/23 Nustep level 6 x 7 minutes 15# Rows 15# straight arm  15# chest press 15# biceps 20# triceps Passive stretch right knee into extension Feet on ball K2C, rotation, bridges, isometric abs Tband clamshells Ball b/n knees with bridge Black tband hip extension  08/22/23 Nustep level 6 x 6 minutes, then level 5 LE only x 5 minutes LEg curls 45# 2x15 left only 25# 2x10 LEft leg ext 10# 2x15 15# row, extension 10# chest press 15# biceps 20# triceps  08/18/23 Nustep level 6 x 6 minutes, LE only level 5 x 3 minutes Leg press 60lb 2x15 Chest press 10lb 2x15 Biceps Curls 10lb 2x10 Rows & Ext 15lb 2x15 At MAT  OHP blue ball 2x15  Seated trunk rotations blue ball   HS curls green 2x15 each 08/15/23 Nustep level 6 x 10 minutes In Pbars standing trying to get him to extend the right hip and have knee locked to bear weight on the right without the knee buckling LEg press 60# 2x15, then left only 40# Supine feet on ball K2C, rotation, bridges, isometric abs Passive stretch  right knee Black tband clamshells Black tband right hip extension Passive stretch right knee into extension Bridges   08/10/23 AT MAT TABLE Seated sit up w/ OHP 2x15 Fiter press 2 blue 2x15 each Seated OHP 5lb 2x11 Seated trunk rotation w/ blue ball 2x15 Nustep level 6 x 8 minutes, LE only level 5 x 4 minutes  08/07/23 Nustep level 6 x 8 minutes, LE only level 5 x 4 minutes Leg curls 45# 2x15, then right only 20# 2x15 15# rows 15# extensions 10# chest press 20# biceps Supine feet on ball K2C, rotation, bridges, isometric abs Passive right knee extension Supine black tband hip extension bilaterally 2x15  08/02/23 Nustep level 6 x 8 minutes then LE only level 5 x 4 minutes Leg curls45#  both legs 20# right only Leg extension 15# left only Fittetr press 2 blue 2x15 15# shoulder ext. 2x15 15# rows 2x15 10# bicep curls 2x15 10 # chest press 2x15 5# shoulder press 3x10  07/31/23 Nustep level 6 x 8 minutes then LE only level 5 x 4 minutes Leg curls45#  both legs 20# right only Leg extension 10# left only 15# biceps curls 15# on pulleys rows 15# extensions in w/c Supine feet on ball bridges, rotation, isometric abs Passive right knee extension Black tband clamshells  07/26/23 Nustep level 6 x 8 minutes, no arms level 5 x 3 minutes In Pbars with brace locked trying to do some weight shifts and weight bearing on the right leg with him lifting the left Passive stretch right HS Seated pulley 15# each side rows and straight arm pulls 10# each pulley seated chest press Feet on ball K2C, rotation, bridges, isometric abs Brace locked right leg SLR's Side lying abduction Supine passive right knee extension Supine black tband hip extension Education of the patient  07/24/23 Nustep level 6 x 8 minutes, no arms level 5 x 3 minutes Leg curls 45# 2x15, then right only 25# 2x10 Leg extension 20# left only x10, then 15# x10 Had a brace on the right knee could not get it right to try  w/b on the right  Feet on ball bridges, rotation, isometric abs Brace locked SLR, hip abduction  07/21/23 Nustep level 6 x 8 minutes, LE only L5 x 3 min Seated in w/c 15# rows, straight arm pulls 15lb  biceps 10lb 2x15 10# each pulley chest press At Mat table  Seated Crunches    W/ OHP yellow wball  Seated trunk rotatons bluw ba//  Horiz abd blue 2x10  HS curls blue 2x15  07/19/23 Nustep level 6 x 7 minutes Seated in w/c 15# rows, straight arm pulls, 15# biceps 2x15 10# each pulley chest press 20# single leg curls 2x15 Supine feet on ball bridge, rotation, isometric abs Black tband clamshells Ball b/n knees squeeze with bridge Black tband hip extension  PATIENT EDUCATION:  Education details: POC Person educated: Patient Education method: Explanation Education comprehension: verbalized understanding  HOME EXERCISE PROGRAM:  ASSESSMENT:  CLINICAL IMPRESSION: In the long run he is impulsive and unsafe and cannot exercise on his own to maintain his level, he is at a high risk for falls and there is no gym that has the ability to assist him. Again talked with him about the safety issues, again today he tried to take a step putting all weight on the right leg, max assist form therapist during this time as we were transferring from [facility] to leg curl machine. Informed pt that's he may be doing too much at home causing LLE pain and soreness.    Had MRI and seen surgeon. Surgen gave to ok to proceeded with procedure but patient not sure if he wasn't to go through with it. Pt is unsure if he can be compliant with protocol.  I am continuing to work on total body strength and function as well as educate him in safety, as he is impulsive and unsafe.     Patient is a 83 y.o. male who was seen today for physical therapy evaluation and treatment for repeated falls and balance issues, he has had 3 quadriceps tendon ruptures in the past year, his last rupture was not repaired so he has no active  right knee extension.  He reports repeated falls, falling every few days. Pt I very impulsive and unsafe with transfers, he is no longer walking and is w/c dependent.   He will benefit from skilled PT to decrease his risk for falls and address his overall strength, function and safety   OBJECTIVE IMPAIRMENTS: Abnormal gait, cardiopulmonary status limiting activity, decreased activity tolerance, decreased balance, decreased  coordination, decreased endurance, decreased mobility, difficulty walking, decreased ROM, decreased strength, improper body mechanics, postural dysfunction, and poor safety.   REHAB POTENTIAL: Fair dependent on carry over and patient compliance  CLINICAL DECISION MAKING: Evolving/moderate complexity  EVALUATION COMPLEXITY: Moderate  GOALS: Goals reviewed with patient? Yes  SHORT TERM GOALS: Target date: 05/29/23  Patient will be independent with initial HEP. Goal status: Met  05/10/23  LONG TERM GOALS: Target date: 07/29/23  Patient will be independent with advanced/ongoing HEP to improve outcomes and carryover.  Goal status:ongoing 07/31/23  2.  Understand the need for safety with his transfers and all the steps to be as safe as possible Goal status: Ongoing 06/01/23  3.  Patient will demonstrate improved functional LE strength as demonstrated by < 25s on 5xSTS. Baseline: 83 seconds had to use hands and was unable to be independent with the standing Goal status: Progressing 07/31/23  4. Increase UE strength to 4+/5 for better transfers and function Goal status: Progressing  07/26/23  5.  Patient will report no falls in a 4 weeks period. Baseline: falls every few days Goal status: Ongoing 06/01/23 fall last "20 or 21" of Feburary, ongoing 07/31/23  PLAN:  PT FREQUENCY: 2x/week  PT DURATION: 12 weeks  PLANNED INTERVENTIONS: Therapeutic exercises, Therapeutic activity, Neuromuscular re-education, Balance training, Gait training, Patient/Family education, Self Care, Joint  mobilization, Stair training, Cryotherapy, Moist heat, and Manual therapy.  PLAN FOR NEXT SESSION: working on the next level for Mr. Antonelli being a maintenance program, will work to get this all set up to Medicare guidelines next visit renewal with maintenance program one time a week  Ollen Beverage, PTA

## 2023-09-05 ENCOUNTER — Ambulatory Visit: Admitting: Physical Therapy

## 2023-09-07 ENCOUNTER — Ambulatory Visit (INDEPENDENT_AMBULATORY_CARE_PROVIDER_SITE_OTHER)

## 2023-09-07 DIAGNOSIS — I480 Paroxysmal atrial fibrillation: Secondary | ICD-10-CM | POA: Diagnosis not present

## 2023-09-07 LAB — CUP PACEART REMOTE DEVICE CHECK
Date Time Interrogation Session: 20250611231521
Implantable Pulse Generator Implant Date: 20220728

## 2023-09-08 ENCOUNTER — Ambulatory Visit: Admitting: Physical Therapy

## 2023-09-08 ENCOUNTER — Encounter: Payer: Self-pay | Admitting: Physical Therapy

## 2023-09-08 DIAGNOSIS — M6281 Muscle weakness (generalized): Secondary | ICD-10-CM

## 2023-09-08 DIAGNOSIS — M25561 Pain in right knee: Secondary | ICD-10-CM

## 2023-09-08 DIAGNOSIS — R2689 Other abnormalities of gait and mobility: Secondary | ICD-10-CM | POA: Diagnosis not present

## 2023-09-08 DIAGNOSIS — R296 Repeated falls: Secondary | ICD-10-CM

## 2023-09-08 DIAGNOSIS — M6249 Contracture of muscle, multiple sites: Secondary | ICD-10-CM

## 2023-09-08 NOTE — Therapy (Signed)
 OUTPATIENT PHYSICAL THERAPY TREATMENT     Patient Name: Sajjad Honea. MRN: [id number] DOB:06/10/1940, 83 y.o., male Today's Date: 09/08/2023  END OF SESSION:  PT End of Session - 09/08/23 0849     Visit Number 34    PT Start Time 0845    PT Stop Time 0930    PT Time Calculation (min) 45 min    Activity Tolerance Patient tolerated treatment well    Behavior During Therapy [facility] for tasks assessed/performed;Impulsive          Past Medical History:  Diagnosis Date   Acquired clawfoot, right foot 12/16/2021   Anxiety    Atrophy of calf muscles 10/21/2021   Duplex calves was negative for significant blockage EMG was done at [facility] MRI lumbar spine was also done at [facility]   Carotid artery occlusion    CKD (chronic kidney disease)    Stage 3   COPD (chronic obstructive pulmonary disease) (HCC)    Depression    Dysrhythmia    PAF, atrial tachycardia   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   The spinal cord appears normal. 2.   No spinal stenosis. 3.   Mild multilevel degenerative changes as detailed above that do not lead to spinal stenosis or nerve root compression. 4.   T2 hyperintense foci within the pons consistent with chronic microvascular ischemic changes.   Loop - Medtronic Linq 10/22/2020 10/22/2020   Nocturia    Paroxysmal atrial fibrillation (HCC)    Peripheral neuropathy    Peripheral vascular disease (HCC)    Sleep apnea    on 6 cm, nasal pillow   Spinal stenosis    Stroke (HCC) 05/01/2011   ischemic   Weakness generalized 10/21/2021   Severe, legs and arms   Past Surgical History:  Procedure Laterality Date   CAROTID ENDARTERECTOMY  12/09/11   Left cea   ENDARTERECTOMY  12/09/2011   Procedure: ENDARTERECTOMY CAROTID;   Surgeon: Mayo Speck, MD;  Location: [facility] OR;  Service: Vascular;  Laterality: Left;   EYE SURGERY     rt retina detachment   HAMMER TOE SURGERY  2004   HERNIA REPAIR  2006   I & D EXTREMITY Right 10/07/2022   Procedure: IRRIGATION AND DEBRIDEMENT EXTREMITY WITH WOUND CLOSURE;  Surgeon: Mort Ards, MD;  Location: [facility] ORS;  Service: Orthopedics;  Laterality: Right;   QUADRICEPS TENDON REPAIR Right 07/12/2022   Procedure: REPAIR QUADRICEP TENDON;  Surgeon: Claiborne Crew, MD;  Location: [facility] OR[facility];  Service: Orthopedics;  Laterality: Right;   QUADRICEPS TENDON REPAIR Right 08/30/2022   Procedure: REPAIR QUADRICEP TENDON;  Surgeon: Claiborne Crew, MD;  Location: [facility];  Service: Orthopedics;  Laterality: Right;   SHOULDER ARTHROSCOPY W/ ROTATOR CUFF REPAIR  2010   TONSILLECTOMY     Patient Active Problem List   Diagnosis Date Noted   Coronary artery calcification 05/09/2023   Rupture of quadriceps tendon 11/03/2022   Wound dehiscence 10/07/2022   Quadriceps tendon rupture, right, subsequent encounter 08/30/2022   Quadriceps tendon rupture, right, initial encounter 07/12/2022   Unable to care for self 07/08/2022   Dehydration 07/08/2022   Depression 07/08/2022   COPD (chronic obstructive pulmonary disease) (HCC) 07/08/2022  Sialoadenitis of submandibular gland 04/28/2022   Macular degeneration 03/01/2022   Myofascial pain 02/10/2022   Lumbar spondylosis 01/03/2022   Acquired clawfoot, left foot 12/16/2021   Acquired clawfoot, right foot 12/16/2021   Claw foot 12/14/2021   Gait abnormality 12/10/2021   Spinal stenosis at L4-L5 level 12/10/2021   Ataxia 11/22/2021   Lightheadedness 11/02/2021   Claudication of lower extremity (HCC) 10/21/2021   Atrophy of calf muscles 10/21/2021   Weakness generalized 10/21/2021   Focal neurological deficit 10/21/2021   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   Pain in joint of right hip 09/14/2021   High arches 07/22/2021    Hammertoes of both feet 07/22/2021   Neuropathy 07/22/2021   Constipation 05/28/2021   Disequilibrium 05/28/2021   History of colonic polyps 05/28/2021   Renal cyst 05/28/2021   Gastroesophageal reflux disease without esophagitis 05/05/2021   Ingrowing left great toenail 02/15/2021   S/P lumbar fusion 12/24/2020   Spinal stenosis of lumbar region 12/04/2020   Acquired complex renal cyst 11/23/2020   Functional gait abnormality 11/04/2020   Idiopathic neuropathy 11/02/2020   Tinea pedis of both feet 10/28/2020   Loop - Medtronic Linq 10/22/2020 10/22/2020   Benign hypertension with 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) 10/05/2020   Fall with injury 10/05/2020   Hematoma 09/16/2020   Normocytic anemia 09/14/2020   Leukocytosis 09/14/2020   AF (paroxysmal atrial fibrillation) (HCC) 09/14/2020   Chronic anticoagulation 09/14/2020   Hyponatremia 09/14/2020   Pain in left foot 01/22/2020   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   Pain in joint of right knee 12/27/2019   Recurrent falls 11/17/2019   Paroxysmal atrial fibrillation (HCC) 10/22/2019   Pain in right foot 10/14/2019   Falls 10/07/2019   Ataxia involving legs 08/12/2019   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   Urge incontinence 04/13/2018   Urinary urgency 04/13/2018   Pain in left knee 03/01/2018   Other intervertebral disc degeneration, lumbar region 06/16/2017   Acquired bilateral hammer toes 10/05/2015   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   Generalized anxiety disorder 05/12/2015   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   History of left-sided carotid endarterectomy 04/10/2014   Essential hypertension 04/10/2014   Occlusion and stenosis of carotid artery without mention of cerebral infarction 12/15/2011   Habitual alcohol use 12/01/2011   Carotid artery stenosis, symptomatic 11/30/2011    Cerebral infarction (HCC) 11/29/2011   Mixed hyperlipidemia     PCP: Scherrie Curt  REFERRING PROVIDER: Scherrie Curt  REFERRING DIAG: weakness, multiple falls  Rationale for Evaluation and Treatment: Rehabilitation  THERAPY DIAG:  Other abnormalities of gait and mobility  Repeated falls  Muscle weakness (generalized)  Contracture of muscle, multiple sites  Acute pain of right knee  ONSET DATE: 10/07/22  SUBJECTIVE:  SUBJECTIVE STATEMENT: Good, a little ache in low back on L side  Patient asking about plan, he reports that he is worried that he will not be able to stay independent and live at home if he stops, he has no equipment and with the right knee's inability to extend and the left knee having issues with buckling due to neuropathy, he is worried about more falls and the strain on his wife  Doing all right, had one fall, fell in the shower yesterday. Did exercises before the showers, legs gave out  on him  PERTINENT HISTORY:  Cerebral infarct 10/21/21, lumbar fusion, recurrent falls, has significant neuropathy and this is the reason for most of his falls   PAIN:  Are you having pain? Yes: NPRS scale: 0/10 Pain location: right knee Pain description: ache  Aggravating factors: being on my feet  Relieving factors: movement  PRECAUTIONS: Fall  WEIGHT BEARING RESTRICTIONS: No  FALLS:  Has patient fallen in last 6 months? Yes. Number of falls every few days   LIVING ENVIRONMENT: Lives with: lives with their spouse Lives in: House/apartment Stairs: Yes: Internal: 20 steps; on right going up Has following equipment at home: O[employer] Blitz - 4 wheeled, Wheelchair (manual), shower chair, Tour manager, Grab bars, Ramped entry, and trekking pole  OCCUPATION: Retired  PLOF: Independent, prior  to a year ago he was driving used trekking poles to walk  PATIENT GOALS: live at home  NEXT MD VISIT:   OBJECTIVE:   DIAGNOSTIC FINDINGS:    LUMBAR SPINE FINDINGS: 1. Wide laminectomy and posterior pedicle screw and rod fixation at L4-5 without residual or recurrent stenosis. 2. Progressive adjacent level disease at L3-4 with moderate right and mild left subarticular narrowing and moderate foraminal stenosis bilaterally. Progression is predominantly due to facet disease 3. Progressive moderate facet hypertrophy at L1-2 with a progressive rightward disc protrusion resulting in progressive moderate right foraminal stenosis. Asymmetric endplate marrow edema on the right at L1-2 is consistent with progressive disease as well. 4. Otherwise stable degenerative change without other focal stenosis.   THORACIC SPINE FINDINGS:  On sagittal views the vertebral bodies have normal height and alignment.  Hemangioma within T9 vertebral body and slightly within T10 vertebral body.  Anterior kyphosis.  Mild scoliosis convex right centered at T8 level. The spinal cord is normal in size and appearance. The paraspinal soft tissues are unremarkable.     On axial views there is no spinal stenosis or foraminal narrowing.  Limited views of the aorta, kidneys, liver, lungs and paraspinal muscles are notable for multiple large right renal cysts ranging in size from 1.5 to 7 cm.   COGNITION: Overall cognitive status: Within functional limits for tasks assessed     SENSATION: WFL  MUSCLE LENGTH: Hamstrings: mod tightness on both sides  POSTURE: rounded shoulders and forward head  LOWER EXTREMITY ROM:   the right knee has HS, has no quad tendon on the right, unable to lift the leg, left leg WFL's , left hip and right hip and ankles WFL's   LOWER EXTREMITY MMT: 4-/5 for the LE's except for the right knee extension UPPER EXTREMITY MMT:  4-/5 for shoulders and elbows   FUNCTIONAL TESTS:  5XSTS: 83  seconds really has to use his hands and is unsteady with standing Cannot stand on his own without holding onto something due to poor balance   Transfers:  needs set up and MinA for safety, is impulsive and unsafe at times. GAIT: He is w/c dependent  TODAY'S TREATMENT:                                                                                                                              DATE:  09/08/23 Nustep level 6 x 7 minutes with arms and then 4 minutes without arms Rows 15lb 2x15 Extension 15lb 2x15 15# biceps 20# triceps Chest press 15lb At mat able  Seated sit ups Trunk rotations with blue ball  HS curls blue x 15 08/31/23 Nustep level 6 x 7 minutes with arms and then 4 minutes without arms 15# rows Lats 20lb 15# chest press 15# biceps 20# triceps 55# leg curls , left only 20# Leg Ext 10lb LLE 2x15 LLE leg Ext 10lb 2x15 Seated hip abd black band   08/29/23 Nustep level 6 x 8 minutes with arms and then 4 minutes without arms 15# rows 15# lats 15# chest press 15# biceps 20# triceps 55# leg curls , left only 25# Supine feet on ball, bridge, isometric abs Black tband clamshells Education on safety  08/24/23 Nustep level 6 x 7 minutes 15# Rows 15# straight arm  15# chest press 15# biceps 20# triceps Passive stretch right knee into extension Feet on ball K2C, rotation, bridges, isometric abs Tband clamshells Ball b/n knees with bridge Black tband hip extension  08/22/23 Nustep level 6 x 6 minutes, then level 5 LE only x 5 minutes LEg curls 45# 2x15 left only 25# 2x10 LEft leg ext 10# 2x15 15# row, extension 10# chest press 15# biceps 20# triceps  08/18/23 Nustep level 6 x 6 minutes, LE only level 5 x 3 minutes Leg press 60lb 2x15 Chest press 10lb 2x15 Biceps Curls 10lb 2x10 Rows & Ext 15lb 2x15 At MAT  OHP blue ball 2x15  Seated trunk rotations blue ball   HS curls green 2x15 each 08/15/23 Nustep level 6 x 10 minutes In Pbars standing  trying to get him to extend the right hip and have knee locked to bear weight on the right without the knee buckling LEg press 60# 2x15, then left only 40# Supine feet on ball K2C, rotation, bridges, isometric abs Passive stretch right knee Black tband clamshells Black tband right hip extension Passive stretch right knee into extension Bridges   08/10/23 AT MAT TABLE Seated sit up w/ OHP 2x15 Fiter press 2 blue 2x15 each Seated OHP 5lb 2x11 Seated trunk rotation w/ blue ball 2x15 Nustep level 6 x 8 minutes, LE only level 5 x 4 minutes  08/07/23 Nustep level 6 x 8 minutes, LE only level 5 x 4 minutes Leg curls 45# 2x15, then right only 20# 2x15 15# rows 15# extensions 10# chest press 20# biceps Supine feet on ball K2C, rotation, bridges, isometric abs Passive right knee extension Supine black tband hip extension bilaterally 2x15  08/02/23 Nustep level 6 x 8 minutes then LE only level 5 x 4 minutes Leg curls45#  both legs 20# right only Leg extension 15#  left only Fittetr press 2 blue 2x15 15# shoulder ext. 2x15 15# rows 2x15 10# bicep curls 2x15 10 # chest press 2x15 5# shoulder press 3x10  07/31/23 Nustep level 6 x 8 minutes then LE only level 5 x 4 minutes Leg curls45#  both legs 20# right only Leg extension 10# left only 15# biceps curls 15# on pulleys rows 15# extensions in w/c Supine feet on ball bridges, rotation, isometric abs Passive right knee extension Black tband clamshells  07/26/23 Nustep level 6 x 8 minutes, no arms level 5 x 3 minutes In Pbars with brace locked trying to do some weight shifts and weight bearing on the right leg with him lifting the left Passive stretch right HS Seated pulley 15# each side rows and straight arm pulls 10# each pulley seated chest press Feet on ball K2C, rotation, bridges, isometric abs Brace locked right leg SLR's Side lying abduction Supine passive right knee extension Supine black tband hip extension Education of  the patient  07/24/23 Nustep level 6 x 8 minutes, no arms level 5 x 3 minutes Leg curls 45# 2x15, then right only 25# 2x10 Leg extension 20# left only x10, then 15# x10 Had a brace on the right knee could not get it right to try w/b on the right  Feet on ball bridges, rotation, isometric abs Brace locked SLR, hip abduction  07/21/23 Nustep level 6 x 8 minutes, LE only L5 x 3 min Seated in w/c 15# rows, straight arm pulls 15lb  biceps 10lb 2x15 10# each pulley chest press At Mat table  Seated Crunches    W/ OHP yellow wball  Seated trunk rotatons bluw ba//  Horiz abd blue 2x10  HS curls blue 2x15  07/19/23 Nustep level 6 x 7 minutes Seated in w/c 15# rows, straight arm pulls, 15# biceps 2x15 10# each pulley chest press 20# single leg curls 2x15 Supine feet on ball bridge, rotation, isometric abs Black tband clamshells Ball b/n knees squeeze with bridge Black tband hip extension  PATIENT EDUCATION:  Education details: POC Person educated: Patient Education method: Explanation Education comprehension: verbalized understanding  HOME EXERCISE PROGRAM:  ASSESSMENT:  CLINICAL IMPRESSION: Justifying Skilled Maintenance for Rehab Services:  The goal of a skilled maintenance therapy program is to maintain the patient's current functional status or to prevent or slow deterioration. If a patient who is receiving restorative therapy then requires skilled maintenance therapy based on the skills and judgment of the therapist, development of a maintenance program would occur during the last visit for restorative treatment.   Does the patient need treatment that necessitates skilled services that can only be provided by a skilled provider? Can a caregiver provide these services why or why not? 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  He is impulsive and at times very unsafe he could not do this on his own and his wife could not help him., history of COPD, CKD, depression, HTN, kyphosis, a-fib, PVD, spinal stenosis, and stroke as well as RC repair.  These limit the patient's independent carryover with PT  exercises and require max cueing for optimal performance of safety and to strengthen other mms for his future in therapy sessions.   The patient requires the skill of PT to help with ongoing  exercise and mobility to prevent rapid and significant decline in functional mobility, strength, and to limit falls with his safety.  Therefore, continued skilled maintenance therapy is recommended for this patient.   Goals for skilled maintenance:   Long Term Goals: Target Date 12/13/23   The patient will preserve functional mobility within the home environment. Baseline: uses a w/c  Goal Status: NEW  2. The patient will maintain strength for transfers.  Baseline: able to perform with set up and at times requires mod A and at other time may need Max A due to poor safety and impulsiveness  Goal Status: NEW  3. The patient will minimize fall frequency.  Baseline: he has been having about a fall a month  Goal Status: NEW  4. Safe with HEP at home and with equipment he has  Baseline: does HEP   Goal Status: NEW   In the long run he is impulsive and unsafe and cannot exercise on his own to maintain his level, he is at a high risk for falls and there is no gym that has the ability to assist him. Again talked with him about the safety issues, again today he tried to take a step putting all weight on the right leg when getting off the leg press.  Informed pt that's he may be doing too much at home causing LLE pain and soreness. PT repots buying a NuStep for at home use   Had MRI and seen surgeon. Surgen gave to ok to proceeded with procedure but patient not sure if he wasn't to go through with it.  Pt is unsure if he can be compliant with protocol.  I am continuing to work on total body strength and function as well as educate him in safety, as he is impulsive and unsafe.     Patient is a 83 y.o. male who was seen today for physical therapy evaluation and treatment for repeated falls and balance issues, he has had 3 quadriceps tendon ruptures in the past year, his last rupture was not repaired so he has no active right knee extension.  He reports repeated falls, falling every few days. Pt I very impulsive and unsafe with transfers, he is no longer walking and is w/c dependent.   He will benefit from skilled PT to decrease his risk for falls and address his overall strength, function and safety   OBJECTIVE IMPAIRMENTS: Abnormal gait, cardiopulmonary status limiting activity, decreased activity tolerance, decreased balance, decreased coordination, decreased endurance, decreased mobility, difficulty walking, decreased ROM, decreased strength, improper body mechanics, postural dysfunction, and poor safety.   REHAB POTENTIAL: Fair dependent on carry over and patient compliance  CLINICAL DECISION MAKING: Evolving/moderate complexity  EVALUATION COMPLEXITY: Moderate  GOALS: Goals reviewed with patient? Yes  SHORT TERM GOALS: Target date: 05/29/23  Patient will be independent with initial HEP. Goal status: Met  05/10/23  LONG TERM GOALS: Target date: 07/29/23  Patient will be independent with advanced/ongoing HEP to improve outcomes and carryover.  Goal status:ongoing 07/31/23  2.  Understand the need for safety with his transfers and all the steps to be as safe as possible Goal status: Ongoing 06/01/23  3.  Patient will demonstrate improved functional LE strength as demonstrated by < 25s on 5xSTS. Baseline: 83 seconds had to use hands and was unable to be independent with the standing Goal status: Progressing 07/31/23  4. Increase UE strength to 4+/5 for better transfers and function Goal  status: Progressing  07/26/23  5.  Patient  will report no falls in a 4 weeks period. Baseline: falls every few days Goal status: Ongoing 06/01/23 fall last 20 or 21 of Feburary, ongoing 07/31/23  PLAN:  PT FREQUENCY: 2x/week  PT DURATION: 12 weeks  PLANNED INTERVENTIONS: Therapeutic exercises, Therapeutic activity, Neuromuscular re-education, Balance training, Gait training, Patient/Family education, Self Care, Joint mobilization, Stair training, Cryotherapy, Moist heat, and Manual therapy.  PLAN FOR NEXT SESSION: working on the next level for Mr. Pillsbury being a maintenance program, will work to get this all set up to Medicare guidelines next visit renewal with maintenance program one time a week  Ollen Beverage, PTA Cherylene Corrente, PT

## 2023-09-10 ENCOUNTER — Ambulatory Visit: Payer: Self-pay | Admitting: Cardiology

## 2023-09-12 ENCOUNTER — Encounter: Payer: Self-pay | Admitting: Physical Therapy

## 2023-09-12 ENCOUNTER — Ambulatory Visit: Admitting: Physical Therapy

## 2023-09-12 DIAGNOSIS — R2689 Other abnormalities of gait and mobility: Secondary | ICD-10-CM

## 2023-09-12 DIAGNOSIS — R296 Repeated falls: Secondary | ICD-10-CM

## 2023-09-12 DIAGNOSIS — M6249 Contracture of muscle, multiple sites: Secondary | ICD-10-CM

## 2023-09-12 DIAGNOSIS — M6281 Muscle weakness (generalized): Secondary | ICD-10-CM

## 2023-09-12 NOTE — Therapy (Addendum)
 OUTPATIENT PHYSICAL THERAPY TREATMENT     Patient Name: Brandon Mendoza. MRN: [id number] DOB:1941-03-03, 83 y.o., male Today's Date: 09/12/2023  END OF SESSION:  PT End of Session - 09/12/23 0848     Visit Number 35    PT Start Time 0845    PT Stop Time 0930    PT Time Calculation (min) 45 min    Activity Tolerance Patient tolerated treatment well    Behavior During Therapy [facility] for tasks assessed/performed;Impulsive          Past Medical History:  Diagnosis Date   Acquired clawfoot, right foot 12/16/2021   Anxiety    Atrophy of calf muscles 10/21/2021   Duplex calves was negative for significant blockage EMG was done at [facility] MRI lumbar spine was also done at [facility]   Carotid artery occlusion    CKD (chronic kidney disease)    Stage 3   COPD (chronic obstructive pulmonary disease) (HCC)    Depression    Dysrhythmia    PAF, atrial tachycardia   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   The spinal cord appears normal. 2.   No spinal stenosis. 3.   Mild multilevel degenerative changes as detailed above that do not lead to spinal stenosis or nerve root compression. 4.   T2 hyperintense foci within the pons consistent with chronic microvascular ischemic changes.   Loop - Medtronic Linq 10/22/2020 10/22/2020   Nocturia    Paroxysmal atrial fibrillation (HCC)    Peripheral neuropathy    Peripheral vascular disease (HCC)    Sleep apnea    on 6 cm, nasal pillow   Spinal stenosis    Stroke (HCC) 05/01/2011   ischemic   Weakness generalized 10/21/2021   Severe, legs and arms   Past Surgical History:  Procedure Laterality Date   CAROTID ENDARTERECTOMY  12/09/11   Left cea   ENDARTERECTOMY  12/09/2011   Procedure: ENDARTERECTOMY CAROTID;   Surgeon: Mayo Speck, MD;  Location: [facility] OR;  Service: Vascular;  Laterality: Left;   EYE SURGERY     rt retina detachment   HAMMER TOE SURGERY  2004   HERNIA REPAIR  2006   I & D EXTREMITY Right 10/07/2022   Procedure: IRRIGATION AND DEBRIDEMENT EXTREMITY WITH WOUND CLOSURE;  Surgeon: Mort Ards, MD;  Location: [facility] ORS;  Service: Orthopedics;  Laterality: Right;   QUADRICEPS TENDON REPAIR Right 07/12/2022   Procedure: REPAIR QUADRICEP TENDON;  Surgeon: Claiborne Crew, MD;  Location: [facility] ORS;  Service: Orthopedics;  Laterality: Right;   QUADRICEPS TENDON REPAIR Right 08/30/2022   Procedure: REPAIR QUADRICEP TENDON;  Surgeon: Claiborne Crew, MD;  Location: [facility] ORS;  Service: Orthopedics;  Laterality: Right;   SHOULDER ARTHROSCOPY W/ ROTATOR CUFF REPAIR  2010   TONSILLECTOMY     Patient Active Problem List   Diagnosis Date Noted   Coronary artery calcification 05/09/2023   Rupture of quadriceps tendon 11/03/2022   Wound dehiscence 10/07/2022   Quadriceps tendon rupture, right, subsequent encounter 08/30/2022   Quadriceps tendon rupture, right, initial encounter 07/12/2022   Unable to care for self 07/08/2022   Dehydration 07/08/2022   Depression 07/08/2022   COPD (chronic obstructive pulmonary disease) (HCC) 07/08/2022  Sialoadenitis of submandibular gland 04/28/2022   Macular degeneration 03/01/2022   Myofascial pain 02/10/2022   Lumbar spondylosis 01/03/2022   Acquired clawfoot, left foot 12/16/2021   Acquired clawfoot, right foot 12/16/2021   Claw foot 12/14/2021   Gait abnormality 12/10/2021   Spinal stenosis at L4-L5 level 12/10/2021   Ataxia 11/22/2021   Lightheadedness 11/02/2021   Claudication of lower extremity (HCC) 10/21/2021   Atrophy of calf muscles 10/21/2021   Weakness generalized 10/21/2021   Focal neurological deficit 10/21/2021   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   Pain in joint of right hip 09/14/2021   High arches 07/22/2021    Hammertoes of both feet 07/22/2021   Neuropathy 07/22/2021   Constipation 05/28/2021   Disequilibrium 05/28/2021   History of colonic polyps 05/28/2021   Renal cyst 05/28/2021   Gastroesophageal reflux disease without esophagitis 05/05/2021   Ingrowing left great toenail 02/15/2021   S/P lumbar fusion 12/24/2020   Spinal stenosis of lumbar region 12/04/2020   Acquired complex renal cyst 11/23/2020   Functional gait abnormality 11/04/2020   Idiopathic neuropathy 11/02/2020   Tinea pedis of both feet 10/28/2020   Loop - Medtronic Linq 10/22/2020 10/22/2020   Benign hypertension with 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) 10/05/2020   Fall with injury 10/05/2020   Hematoma 09/16/2020   Normocytic anemia 09/14/2020   Leukocytosis 09/14/2020   AF (paroxysmal atrial fibrillation) (HCC) 09/14/2020   Chronic anticoagulation 09/14/2020   Hyponatremia 09/14/2020   Pain in left foot 01/22/2020   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   Pain in joint of right knee 12/27/2019   Recurrent falls 11/17/2019   Paroxysmal atrial fibrillation (HCC) 10/22/2019   Pain in right foot 10/14/2019   Falls 10/07/2019   Ataxia involving legs 08/12/2019   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   Urge incontinence 04/13/2018   Urinary urgency 04/13/2018   Pain in left knee 03/01/2018   Other intervertebral disc degeneration, lumbar region 06/16/2017   Acquired bilateral hammer toes 10/05/2015   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   Generalized anxiety disorder 05/12/2015   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   History of left-sided carotid endarterectomy 04/10/2014   Essential hypertension 04/10/2014   Occlusion and stenosis of carotid artery without mention of cerebral infarction 12/15/2011   Habitual alcohol use 12/01/2011   Carotid artery stenosis, symptomatic 11/30/2011    Cerebral infarction (HCC) 11/29/2011   Mixed hyperlipidemia     PCP: Scherrie Curt  REFERRING PROVIDER: Scherrie Curt  REFERRING DIAG: weakness, multiple falls  Rationale for Evaluation and Treatment: Rehabilitation  THERAPY DIAG:  Other abnormalities of gait and mobility  Repeated falls  Muscle weakness (generalized)  Contracture of muscle, multiple sites  ONSET DATE: 10/07/22  SUBJECTIVE:  SUBJECTIVE STATEMENT: Had a fall yesterday,  standing trying to pull his pants ups.  Patient asking about plan, he reports that he is worried that he will not be able to stay independent and live at home if he stops, he has no equipment and with the right knee's inability to extend and the left knee having issues with buckling due to neuropathy, he is worried about more falls and the strain on his wife  Doing all right, had one fall, fell in the shower yesterday. Did exercises before the showers, legs gave out  on him  PERTINENT HISTORY:  Cerebral infarct 10/21/21, lumbar fusion, recurrent falls, has significant neuropathy and this is the reason for most of his falls   PAIN:  Are you having pain? Yes: NPRS scale: 0/10 Pain location: right knee Pain description: ache  Aggravating factors: being on my feet  Relieving factors: movement  PRECAUTIONS: Fall  WEIGHT BEARING RESTRICTIONS: No  FALLS:  Has patient fallen in last 6 months? Yes. Number of falls every few days   LIVING ENVIRONMENT: Lives with: lives with their spouse Lives in: House/apartment Stairs: Yes: Internal: 20 steps; on right going up Has following equipment at home: O[employer] Blitz - 4 wheeled, Wheelchair (manual), shower chair, Tour manager, Grab bars, Ramped entry, and trekking pole  OCCUPATION: Retired  PLOF: Independent, prior to a  year ago he was driving used trekking poles to walk  PATIENT GOALS: live at home  NEXT MD VISIT:   OBJECTIVE:   DIAGNOSTIC FINDINGS:    LUMBAR SPINE FINDINGS: 1. Wide laminectomy and posterior pedicle screw and rod fixation at L4-5 without residual or recurrent stenosis. 2. Progressive adjacent level disease at L3-4 with moderate right and mild left subarticular narrowing and moderate foraminal stenosis bilaterally. Progression is predominantly due to facet disease 3. Progressive moderate facet hypertrophy at L1-2 with a progressive rightward disc protrusion resulting in progressive moderate right foraminal stenosis. Asymmetric endplate marrow edema on the right at L1-2 is consistent with progressive disease as well. 4. Otherwise stable degenerative change without other focal stenosis.   THORACIC SPINE FINDINGS:  On sagittal views the vertebral bodies have normal height and alignment.  Hemangioma within T9 vertebral body and slightly within T10 vertebral body.  Anterior kyphosis.  Mild scoliosis convex right centered at T8 level. The spinal cord is normal in size and appearance. The paraspinal soft tissues are unremarkable.     On axial views there is no spinal stenosis or foraminal narrowing.  Limited views of the aorta, kidneys, liver, lungs and paraspinal muscles are notable for multiple large right renal cysts ranging in size from 1.5 to 7 cm.   COGNITION: Overall cognitive status: Within functional limits for tasks assessed     SENSATION: WFL  MUSCLE LENGTH: Hamstrings: mod tightness on both sides  POSTURE: rounded shoulders and forward head  LOWER EXTREMITY ROM:   the right knee has HS, has no quad tendon on the right, unable to lift the leg, left leg WFL's , left hip and right hip and ankles WFL's   LOWER EXTREMITY MMT: 4-/5 for the LE's except for the right knee extension UPPER EXTREMITY MMT:  4-/5 for shoulders and elbows   FUNCTIONAL TESTS:  5XSTS: 83  seconds really has to use his hands and is unsteady with standing Cannot stand on his own without holding onto something due to poor balance   Transfers:  needs set up and MinA for safety, is impulsive and unsafe at times. GAIT: He is w/c  dependent    TODAY'S TREATMENT:                                                                                                                              DATE:  09/12/23 Nustep level 6 x 7 minutes with arms and then 4 minutes without arms At mat able  Seated sit ups Front raises blue ball  Horiz abd black Fitter press all bands w/ LLE 2x15  1black 1 blue band RLE 2x15 Trunk rotations with blue ball  Rows black band 2x10 OHP 3lb dumbell   09/08/23 Nustep level 6 x 7 minutes with arms and then 4 minutes without arms Rows 15lb 2x15 Extension 15lb 2x15 15# biceps 20# triceps Chest press 15lb At mat able  Seated sit ups Trunk rotations with blue ball  HS curls blue x 15 08/31/23 Nustep level 6 x 7 minutes with arms and then 4 minutes without arms 15# rows Lats 20lb 15# chest press 15# biceps 20# triceps 55# leg curls , left only 20# Leg Ext 10lb LLE 2x15 LLE leg Ext 10lb 2x15 Seated hip abd black band   08/29/23 Nustep level 6 x 8 minutes with arms and then 4 minutes without arms 15# rows 15# lats 15# chest press 15# biceps 20# triceps 55# leg curls , left only 25# Supine feet on ball, bridge, isometric abs Black tband clamshells Education on safety  08/24/23 Nustep level 6 x 7 minutes 15# Rows 15# straight arm  15# chest press 15# biceps 20# triceps Passive stretch right knee into extension Feet on ball K2C, rotation, bridges, isometric abs Tband clamshells Ball b/n knees with bridge Black tband hip extension  08/22/23 Nustep level 6 x 6 minutes, then level 5 LE only x 5 minutes LEg curls 45# 2x15 left only 25# 2x10 LEft leg ext 10# 2x15 15# row, extension 10# chest press 15# biceps 20# triceps  08/18/23 Nustep  level 6 x 6 minutes, LE only level 5 x 3 minutes Leg press 60lb 2x15 Chest press 10lb 2x15 Biceps Curls 10lb 2x10 Rows & Ext 15lb 2x15 At MAT  OHP blue ball 2x15  Seated trunk rotations blue ball   HS curls green 2x15 each 08/15/23 Nustep level 6 x 10 minutes In Pbars standing trying to get him to extend the right hip and have knee locked to bear weight on the right without the knee buckling LEg press 60# 2x15, then left only 40# Supine feet on ball K2C, rotation, bridges, isometric abs Passive stretch right knee Black tband clamshells Black tband right hip extension Passive stretch right knee into extension Bridges  PATIENT EDUCATION:  Education details: POC Person educated: Patient Education method: Explanation Education comprehension: verbalized understanding  HOME EXERCISE PROGRAM:  ASSESSMENT:  CLINICAL IMPRESSION:   In the long run he is impulsive and unsafe and cannot exercise on his own to maintain his level, he is at a high risk for falls and there is no  gym that has the ability to assist him. Session completed at mat table to eliminate trunk support for improved core stabilization. Core weakness with seated OHP.  Pt did report a fall yesterday stated he was standing pulling his pants up, I advised pt that he shouldn't put weight in his RLE, He ws adamant that he could put a little on it.     Justifying Skilled Maintenance for Rehab Services:   The goal of a skilled maintenance therapy program is to maintain the patient's current functional status or to prevent or slow deterioration. If a patient who is receiving restorative therapy then requires skilled maintenance therapy based on the skills and judgment of the therapist, development of a maintenance program would occur during the last visit for restorative treatment.    Does the patient need treatment that necessitates skilled services that can only be provided by a skilled provider? Can a caregiver provide these  services why or why not? 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  He is impulsive and at times very unsafe he could not do this on his own and his wife could not help him., history of COPD, CKD, depression, HTN, kyphosis, a-fib, PVD, spinal stenosis, and stroke as well as RC repair.  These limit the patient's independent carryover with PT  exercises and require max cueing for optimal performance of safety and to strengthen other mms for his future in therapy sessions.    The patient requires the skill of PT to help with ongoing exercise and mobility to prevent rapid and significant decline in functional mobility, strength, and to limit falls with his safety.  Therefore, continued skilled maintenance therapy is recommended for this patient.     Goals for skilled maintenance:     Long Term Goals: Target Date 12/13/23     The patient will preserve functional mobility within the home environment. Baseline: uses a w/c            Goal Status: NEW   2. The patient will maintain strength for transfers.            Baseline: able to perform with set up and at times requires mod A and at other time may need Max A due to poor safety and impulsiveness            Goal Status: NEW   3. The patient will minimize fall frequency.            Baseline: he has been having about a fall a month            Goal Status: NEW   4. Safe with HEP at home and with equipment he has            Baseline: does HEP             Goal Status: NEW    Had MRI and seen surgeon. Surgen gave to ok to proceeded with procedure but patient not sure if he wasn't to go through with it. Pt is unsure if he can be compliant with protocol.  I am continuing to work on total body strength and function as well as educate him in safety, as he is  impulsive and unsafe.     Patient is a 83  y.o. male who was seen today for physical therapy evaluation and treatment for repeated falls and balance issues, he has had 3 quadriceps tendon ruptures in the past year, his last rupture was not repaired so he has no active right knee extension.  He reports repeated falls, falling every few days. Pt I very impulsive and unsafe with transfers, he is no longer walking and is w/c dependent.   He will benefit from skilled PT to decrease his risk for falls and address his overall strength, function and safety   OBJECTIVE IMPAIRMENTS: Abnormal gait, cardiopulmonary status limiting activity, decreased activity tolerance, decreased balance, decreased coordination, decreased endurance, decreased mobility, difficulty walking, decreased ROM, decreased strength, improper body mechanics, postural dysfunction, and poor safety.   REHAB POTENTIAL: Fair dependent on carry over and patient compliance  CLINICAL DECISION MAKING: Evolving/moderate complexity  EVALUATION COMPLEXITY: Moderate  GOALS: Goals reviewed with patient? Yes  SHORT TERM GOALS: Target date: 05/29/23  Patient will be independent with initial HEP. Goal status: Met  05/10/23  LONG TERM GOALS: Target date: 07/29/23  Patient will be independent with advanced/ongoing HEP to improve outcomes and carryover.  Goal status:ongoing 07/31/23  2.  Understand the need for safety with his transfers and all the steps to be as safe as possible Goal status: Ongoing 06/01/23  3.  Patient will demonstrate improved functional LE strength as demonstrated by < 25s on 5xSTS. Baseline: 83 seconds had to use hands and was unable to be independent with the standing Goal status: Progressing 07/31/23  4. Increase UE strength to 4+/5 for better transfers and function Goal status: Progressing  07/26/23  5.  Patient will report no falls in a 4 weeks period. Baseline: falls every few days Goal status: Ongoing 06/01/23 fall last 20  or 21 of Feburary, ongoing 07/31/23  PLAN:  PT FREQUENCY: 2x/week  PT DURATION: 12 weeks  PLANNED INTERVENTIONS: Therapeutic exercises, Therapeutic activity, Neuromuscular re-education, Balance training, Gait training, Patient/Family education, Self Care, Joint mobilization, Stair training, Cryotherapy, Moist heat, and Manual therapy.  PLAN FOR NEXT SESSION: working on the next level for Mr. Geiman being a maintenance program, will work to get this all set up to Medicare guidelines next visit renewal with maintenance program one time a week  Ollen Beverage, PTA

## 2023-09-12 NOTE — Addendum Note (Signed)
 Addended by: Hollis Lurie on: 09/12/2023 10:25 AM   Modules accepted: Orders

## 2023-09-13 ENCOUNTER — Ambulatory Visit: Admitting: Physical Therapy

## 2023-09-21 ENCOUNTER — Ambulatory Visit: Admitting: Physical Therapy

## 2023-09-21 ENCOUNTER — Encounter: Payer: Self-pay | Admitting: Physical Therapy

## 2023-09-21 DIAGNOSIS — R2689 Other abnormalities of gait and mobility: Secondary | ICD-10-CM

## 2023-09-21 DIAGNOSIS — M6249 Contracture of muscle, multiple sites: Secondary | ICD-10-CM

## 2023-09-21 DIAGNOSIS — M6281 Muscle weakness (generalized): Secondary | ICD-10-CM

## 2023-09-21 DIAGNOSIS — R296 Repeated falls: Secondary | ICD-10-CM

## 2023-09-21 NOTE — Progress Notes (Signed)
 [facility] Summary Report / Loop Recorder

## 2023-09-21 NOTE — Therapy (Signed)
 OUTPATIENT PHYSICAL THERAPY TREATMENT     Patient Name: Brandon Mendoza. MRN: [id number] DOB:[birth year], 83 y.o., male Today's Date: 09/21/2023  END OF SESSION:  PT End of Session - 09/21/23 0845     Visit Number [id number]    Date for PT Re-Evaluation 10/08/23    PT Start Time 0843    PT Stop Time 0930    PT Time Calculation (min) 47 min    Activity Tolerance Patient tolerated treatment well    Behavior During Therapy [facility] for tasks assessed/performed;Impulsive          Past Medical History:  Diagnosis Date   Acquired clawfoot, right foot 12/16/2021   Anxiety    Atrophy of calf muscles 10/21/2021   Duplex calves was negative for significant blockage EMG was done at [facility] MRI lumbar spine was also done at [facility]   Carotid artery occlusion    CKD (chronic kidney disease)    Stage 3   COPD (chronic obstructive pulmonary disease) (HCC)    Depression    Dysrhythmia    PAF, atrial tachycardia   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   The spinal cord appears normal. 2.   No spinal stenosis. 3.   Mild multilevel degenerative changes as detailed above that do not lead to spinal stenosis or nerve root compression. 4.   T2 hyperintense foci within the pons consistent with chronic microvascular ischemic changes.   Loop - Medtronic Linq 10/22/2020 10/22/2020   Nocturia    Paroxysmal atrial fibrillation (HCC)    Peripheral neuropathy    Peripheral vascular disease (HCC)    Sleep apnea    on 6 cm, nasal pillow   Spinal stenosis    Stroke (HCC) 05/01/2011   ischemic   Weakness generalized 10/21/2021   Severe, legs and arms   Past Surgical History:  Procedure Laterality Date   CAROTID ENDARTERECTOMY  12/09/11   Left cea   ENDARTERECTOMY  12/09/2011    Procedure: ENDARTERECTOMY CAROTID;  Surgeon: Krystal JULIANNA Doing, MD;  Location: [facility] OR;  Service: Vascular;  Laterality: Left;   EYE SURGERY     rt retina detachment   HAMMER TOE SURGERY  2004   HERNIA REPAIR  2006   I & D EXTREMITY Right 10/07/2022   Procedure: IRRIGATION AND DEBRIDEMENT EXTREMITY WITH WOUND CLOSURE;  Surgeon: Burnetta Aures, MD;  Location: [facility] ORS;  Service: Orthopedics;  Laterality: Right;   QUADRICEPS TENDON REPAIR Right 07/12/2022   Procedure: REPAIR QUADRICEP TENDON;  Surgeon: Ernie Cough, MD;  Location: [facility] ORS;  Service: Orthopedics;  Laterality: Right;   QUADRICEPS TENDON REPAIR Right 08/30/2022   Procedure: REPAIR QUADRICEP TENDON;  Surgeon: Ernie Cough, MD;  Location: [facility] ORS;  Service: Orthopedics;  Laterality: Right;   SHOULDER ARTHROSCOPY W/ ROTATOR CUFF REPAIR  2010   TONSILLECTOMY     Patient Active Problem List   Diagnosis Date Noted   Coronary artery calcification 05/09/2023   Rupture of quadriceps tendon 11/03/2022   Wound dehiscence 10/07/2022   Quadriceps tendon rupture, right, subsequent encounter 08/30/2022   Quadriceps tendon rupture, right, initial encounter 07/12/2022   Unable to care for self 07/08/2022   Dehydration 07/08/2022   Depression 07/08/2022   COPD (  chronic obstructive pulmonary disease) (HCC) 07/08/2022   Sialoadenitis of submandibular gland 04/28/2022   Macular degeneration 03/01/2022   Myofascial pain 02/10/2022   Lumbar spondylosis 01/03/2022   Acquired clawfoot, left foot 12/16/2021   Acquired clawfoot, right foot 12/16/2021   Claw foot 12/14/2021   Gait abnormality 12/10/2021   Spinal stenosis at L4-L5 level 12/10/2021   Ataxia 11/22/2021   Lightheadedness 11/02/2021   Claudication of lower extremity (HCC) 10/21/2021   Atrophy of calf muscles 10/21/2021   Weakness generalized 10/21/2021   Focal neurological deficit 10/21/2021   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   Pain in joint of right hip  09/14/2021   High arches 07/22/2021   Hammertoes of both feet 07/22/2021   Neuropathy 07/22/2021   Constipation 05/28/2021   Disequilibrium 05/28/2021   History of colonic polyps 05/28/2021   Renal cyst 05/28/2021   Gastroesophageal reflux disease without esophagitis 05/05/2021   Ingrowing left great toenail 02/15/2021   S/P lumbar fusion 12/24/2020   Spinal stenosis of lumbar region 12/04/2020   Acquired complex renal cyst 11/23/2020   Functional gait abnormality 11/04/2020   Idiopathic neuropathy 11/02/2020   Tinea pedis of both feet 10/28/2020   Loop - Medtronic Linq 10/22/2020 10/22/2020   Benign hypertension with 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) 10/05/2020   Fall with injury 10/05/2020   Hematoma 09/16/2020   Normocytic anemia 09/14/2020   Leukocytosis 09/14/2020   AF (paroxysmal atrial fibrillation) (HCC) 09/14/2020   Chronic anticoagulation 09/14/2020   Hyponatremia 09/14/2020   Pain in left foot 01/22/2020   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   Pain in joint of right knee 12/27/2019   Recurrent falls 11/17/2019   Paroxysmal atrial fibrillation (HCC) 10/22/2019   Pain in right foot 10/14/2019   Falls 10/07/2019   Ataxia involving legs 08/12/2019   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   Urge incontinence 04/13/2018   Urinary urgency 04/13/2018   Pain in left knee 03/01/2018   Other intervertebral disc degeneration, lumbar region 06/16/2017   Acquired bilateral hammer toes 10/05/2015   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   Generalized anxiety disorder 05/12/2015   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   History of left-sided carotid endarterectomy 04/10/2014   Essential hypertension 04/10/2014   Occlusion and stenosis of carotid artery without mention of cerebral infarction 12/15/2011   Habitual alcohol use 12/01/2011   Carotid artery  stenosis, symptomatic 11/30/2011   Cerebral infarction (HCC) 11/29/2011   Mixed hyperlipidemia     PCP: Bernardino Cone  REFERRING PROVIDER: Bernardino Cone  REFERRING DIAG: weakness, multiple falls  Rationale for Evaluation and Treatment: Rehabilitation  THERAPY DIAG:  Other abnormalities of gait and mobility  Repeated falls  Muscle weakness (generalized)  Contracture of muscle, multiple sites  ONSET DATE: 10/07/22  SUBJECTIVE:  SUBJECTIVE STATEMENT: Im ok  Patient asking about plan, he reports that he is worried that he will not be able to stay independent and live at home if he stops, he has no equipment and with the right knee's inability to extend and the left knee having issues with buckling due to neuropathy, he is worried about more falls and the strain on his wife  Doing all right, had one fall, fell in the shower yesterday. Did exercises before the showers, legs gave out  on him  PERTINENT HISTORY:  Cerebral infarct 10/21/21, lumbar fusion, recurrent falls, has significant neuropathy and this is the reason for most of his falls   PAIN:  Are you having pain? Yes: NPRS scale: 0/10 Pain location: right knee Pain description: ache  Aggravating factors: being on my feet  Relieving factors: movement  PRECAUTIONS: Fall  WEIGHT BEARING RESTRICTIONS: No  FALLS:  Has patient fallen in last 6 months? Yes. Number of falls every few days   LIVING ENVIRONMENT: Lives with: lives with their spouse Lives in: House/apartment Stairs: Yes: Internal: 20 steps; on right going up Has following equipment at home: [name] - 4 wheeled, Wheelchair (manual), shower chair, Tour manager, Grab bars, Ramped entry, and trekking pole  OCCUPATION: Retired  PLOF: Independent, prior to a year ago he was  driving used trekking poles to walk  PATIENT GOALS: live at home  NEXT MD VISIT:   OBJECTIVE:   DIAGNOSTIC FINDINGS:    LUMBAR SPINE FINDINGS: 1. Wide laminectomy and posterior pedicle screw and rod fixation at L4-5 without residual or recurrent stenosis. 2. Progressive adjacent level disease at L3-4 with moderate right and mild left subarticular narrowing and moderate foraminal stenosis bilaterally. Progression is predominantly due to facet disease 3. Progressive moderate facet hypertrophy at L1-2 with a progressive rightward disc protrusion resulting in progressive moderate right foraminal stenosis. Asymmetric endplate marrow edema on the right at L1-2 is consistent with progressive disease as well. 4. Otherwise stable degenerative change without other focal stenosis.   THORACIC SPINE FINDINGS:  On sagittal views the vertebral bodies have normal height and alignment.  Hemangioma within T9 vertebral body and slightly within T10 vertebral body.  Anterior kyphosis.  Mild scoliosis convex right centered at T8 level. The spinal cord is normal in size and appearance. The paraspinal soft tissues are unremarkable.     On axial views there is no spinal stenosis or foraminal narrowing.  Limited views of the aorta, kidneys, liver, lungs and paraspinal muscles are notable for multiple large right renal cysts ranging in size from 1.5 to 7 cm.   COGNITION: Overall cognitive status: Within functional limits for tasks assessed     SENSATION: WFL  MUSCLE LENGTH: Hamstrings: mod tightness on both sides  POSTURE: rounded shoulders and forward head  LOWER EXTREMITY ROM:   the right knee has HS, has no quad tendon on the right, unable to lift the leg, left leg WFL's , left hip and right hip and ankles WFL's   LOWER EXTREMITY MMT: 4-/5 for the LE's except for the right knee extension UPPER EXTREMITY MMT:  4-/5 for shoulders and elbows   FUNCTIONAL TESTS:  5XSTS: 83 seconds really has to  use his hands and is unsteady with standing Cannot stand on his own without holding onto something due to poor balance   Transfers:  needs set up and MinA for safety, is impulsive and unsafe at times. GAIT: He is w/c dependent    TODAY'S TREATMENT:  DATE:  09/21/23 Nustep level 6 x 7 minutes with arms and then 4 minutes without arms Rows 15lb 2x15 Extension 15lb 2x15 15# biceps 20# triceps Chest press 15lb HS curls 55lb 2x15, RLE 20b 2x15 Leg Ext LLE 15lb 2x15 Fitter press RLE 1 blue 1black, LLE all bands   09/12/23 Nustep level 6 x 7 minutes with arms and then 4 minutes without arms At mat able  Seated sit ups Front raises blue ball  Horiz abd black Fitter press all bands w/ LLE 2x15  1black 1 blue band RLE 2x15 Trunk rotations with blue ball  Rows black band 2x10 OHP 3lb dumbell   09/08/23 Nustep level 6 x 7 minutes with arms and then 4 minutes without arms Rows 15lb 2x15 Extension 15lb 2x15 15# biceps 20# triceps Chest press 15lb At mat able  Seated sit ups Trunk rotations with blue ball  HS curls blue x 15 08/31/23 Nustep level 6 x 7 minutes with arms and then 4 minutes without arms 15# rows Lats 20lb 15# chest press 15# biceps 20# triceps 55# leg curls , left only 20# Leg Ext 10lb LLE 2x15 LLE leg Ext 10lb 2x15 Seated hip abd black band   08/29/23 Nustep level 6 x 8 minutes with arms and then 4 minutes without arms 15# rows 15# lats 15# chest press 15# biceps 20# triceps 55# leg curls , left only 25# Supine feet on ball, bridge, isometric abs Black tband clamshells Education on safety  08/24/23 Nustep level 6 x 7 minutes 15# Rows 15# straight arm  15# chest press 15# biceps 20# triceps Passive stretch right knee into extension Feet on ball K2C, rotation, bridges, isometric abs Tband clamshells Ball b/n knees with  bridge Black tband hip extension  08/22/23 Nustep level 6 x 6 minutes, then level 5 LE only x 5 minutes LEg curls 45# 2x15 left only 25# 2x10 LEft leg ext 10# 2x15 15# row, extension 10# chest press 15# biceps 20# triceps  08/18/23 Nustep level 6 x 6 minutes, LE only level 5 x 3 minutes Leg press 60lb 2x15 Chest press 10lb 2x15 Biceps Curls 10lb 2x10 Rows & Ext 15lb 2x15 At MAT  OHP blue ball 2x15  Seated trunk rotations blue ball   HS curls green 2x15 each 08/15/23 Nustep level 6 x 10 minutes In Pbars standing trying to get him to extend the right hip and have knee locked to bear weight on the right without the knee buckling LEg press 60# 2x15, then left only 40# Supine feet on ball K2C, rotation, bridges, isometric abs Passive stretch right knee Black tband clamshells Black tband right hip extension Passive stretch right knee into extension Bridges  PATIENT EDUCATION:  Education details: POC Person educated: Patient Education method: Explanation Education comprehension: verbalized understanding  HOME EXERCISE PROGRAM:  ASSESSMENT:  CLINICAL IMPRESSION: Pt enters doing well, upper and lower body strength within ability. No repots of increase pain. Tactile cue needed to keep shoulders down and back with rows and extensions.   In the long run he is impulsive and unsafe and cannot exercise on his own to maintain his level, he is at a high risk for falls and there is no gym that has the ability to assist him. Session completed at mat table to eliminate trunk support for improved core stabilization. Core weakness with seated OHP.  Pt did report a fall yesterday stated he was standing pulling his pants up, I advised pt that he shouldn't put weight in his  RLE, He ws adamant that he could put a little on it.     Justifying Skilled Maintenance for Rehab Services:   The goal of a skilled maintenance therapy program is to maintain the patient's current functional status or to  prevent or slow deterioration. If a patient who is receiving restorative therapy then requires skilled maintenance therapy based on the skills and judgment of the therapist, development of a maintenance program would occur during the last visit for restorative treatment.    Does the patient need treatment that necessitates skilled services that can only be provided by a skilled provider? Can a caregiver provide these services why or why not? 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  He is impulsive and at times very unsafe he could not do this on his own and his wife could not help him., history of COPD, CKD, depression, HTN, kyphosis, a-fib, PVD, spinal stenosis, and stroke as well as RC repair.  These limit the patient's independent carryover with PT  exercises and require max cueing for optimal performance of safety and to strengthen other mms for his future in therapy sessions.    The patient requires the skill of PT to help with ongoing exercise and mobility to prevent rapid and significant decline in functional mobility, strength, and to limit falls with his safety.  Therefore, continued skilled maintenance therapy is recommended for this patient.    OBJECTIVE IMPAIRMENTS: Abnormal gait, cardiopulmonary status limiting activity, decreased activity tolerance, decreased balance, decreased coordination, decreased endurance, decreased mobility, difficulty walking, decreased ROM, decreased strength, improper body mechanics, postural dysfunction, and poor safety.   REHAB POTENTIAL: Fair dependent on carry over and patient compliance  CLINICAL DECISION MAKING: Evolving/moderate complexity  EVALUATION COMPLEXITY: Moderate  GOALS: Goals for skilled maintenance:     Long Term Goals: Target Date 12/13/23      The patient will preserve functional mobility within the home environment. Baseline: uses a w/c            Goal Status: NEW   2. The patient will maintain strength for transfers.            Baseline: able to perform with set up and at times requires mod A and at other time may need Max A due to poor safety and impulsiveness            Goal Status: NEW   3. The patient will minimize fall frequency.            Baseline: he has been having about a fall a month            Goal Status: NEW   4. Safe with HEP at home and with equipment he has            Baseline: does HEP             Goal Status: NEW    PLAN:  PT FREQUENCY: 2x/week  PT DURATION: 12 weeks  PLANNED INTERVENTIONS: Therapeutic exercises, Therapeutic activity, Neuromuscular re-education, Balance training, Gait training, Patient/Family education, Self Care, Joint mobilization, Stair training, Cryotherapy, Moist heat, and Manual therapy.  PLAN FOR NEXT SESSION: working on the next level for Mr. Madero being a maintenance program, will work to get this all set up to  Medicare guidelines next visit renewal with maintenance program one time a week  Tanda KANDICE Sorrow, PTA

## 2023-09-26 ENCOUNTER — Ambulatory Visit: Attending: Internal Medicine | Admitting: Physical Therapy

## 2023-09-26 ENCOUNTER — Encounter: Payer: Self-pay | Admitting: Physical Therapy

## 2023-09-26 DIAGNOSIS — M6281 Muscle weakness (generalized): Secondary | ICD-10-CM | POA: Diagnosis present

## 2023-09-26 DIAGNOSIS — M5459 Other low back pain: Secondary | ICD-10-CM | POA: Insufficient documentation

## 2023-09-26 DIAGNOSIS — M6249 Contracture of muscle, multiple sites: Secondary | ICD-10-CM | POA: Diagnosis present

## 2023-09-26 DIAGNOSIS — R2689 Other abnormalities of gait and mobility: Secondary | ICD-10-CM | POA: Diagnosis present

## 2023-09-26 DIAGNOSIS — M25561 Pain in right knee: Secondary | ICD-10-CM | POA: Insufficient documentation

## 2023-09-26 DIAGNOSIS — R296 Repeated falls: Secondary | ICD-10-CM | POA: Insufficient documentation

## 2023-09-26 NOTE — Therapy (Signed)
 OUTPATIENT PHYSICAL THERAPY TREATMENT     Patient Name: Brandon Mendoza. MRN: [id number] DOB:1941/03/13, 83 y.o., male Today's Date: 09/26/2023  END OF SESSION:  PT End of Session - 09/26/23 1059     Visit Number 37    Date for PT Re-Evaluation 10/08/23    PT Start Time 1100    PT Stop Time 1145    PT Time Calculation (min) 45 min    Activity Tolerance Patient tolerated treatment well    Behavior During Therapy [facility] for tasks assessed/performed;Impulsive          Past Medical History:  Diagnosis Date   Acquired clawfoot, right foot 12/16/2021   Anxiety    Atrophy of calf muscles 10/21/2021   Duplex calves was negative for significant blockage EMG was done at [facility] MRI lumbar spine was also done at [facility]   Carotid artery occlusion    CKD (chronic kidney disease)    Stage 3   COPD (chronic obstructive pulmonary disease) (HCC)    Depression    Dysrhythmia    PAF, atrial tachycardia   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   The spinal cord appears normal. 2.   No spinal stenosis. 3.   Mild multilevel degenerative changes as detailed above that do not lead to spinal stenosis or nerve root compression. 4.   T2 hyperintense foci within the pons consistent with chronic microvascular ischemic changes.   Loop - Medtronic Linq 10/22/2020 10/22/2020   Nocturia    Paroxysmal atrial fibrillation (HCC)    Peripheral neuropathy    Peripheral vascular disease (HCC)    Sleep apnea    on 6 cm, nasal pillow   Spinal stenosis    Stroke (HCC) 05/01/2011   ischemic   Weakness generalized 10/21/2021   Severe, legs and arms   Past Surgical History:  Procedure Laterality Date   CAROTID ENDARTERECTOMY  12/09/11   Left cea   ENDARTERECTOMY  12/09/2011    Procedure: ENDARTERECTOMY CAROTID;  Surgeon: Krystal JULIANNA Doing, MD;  Location: [facility] OR;  Service: Vascular;  Laterality: Left;   EYE SURGERY     rt retina detachment   HAMMER TOE SURGERY  2004   HERNIA REPAIR  2006   I & D EXTREMITY Right 10/07/2022   Procedure: IRRIGATION AND DEBRIDEMENT EXTREMITY WITH WOUND CLOSURE;  Surgeon: Burnetta Aures, MD;  Location: [facility] ORS;  Service: Orthopedics;  Laterality: Right;   QUADRICEPS TENDON REPAIR Right 07/12/2022   Procedure: REPAIR QUADRICEP TENDON;  Surgeon: Ernie Cough, MD;  Location: [facility] ORS;  Service: Orthopedics;  Laterality: Right;   QUADRICEPS TENDON REPAIR Right 08/30/2022   Procedure: REPAIR QUADRICEP TENDON;  Surgeon: Ernie Cough, MD;  Location: [facility] ORS;  Service: Orthopedics;  Laterality: Right;   SHOULDER ARTHROSCOPY W/ ROTATOR CUFF REPAIR  2010   TONSILLECTOMY     Patient Active Problem List   Diagnosis Date Noted   Coronary artery calcification 05/09/2023   Rupture of quadriceps tendon 11/03/2022   Wound dehiscence 10/07/2022   Quadriceps tendon rupture, right, subsequent encounter 08/30/2022   Quadriceps tendon rupture, right, initial encounter 07/12/2022   Unable to care for self 07/08/2022   Dehydration 07/08/2022   Depression 07/08/2022   COPD (  chronic obstructive pulmonary disease) (HCC) 07/08/2022   Sialoadenitis of submandibular gland 04/28/2022   Macular degeneration 03/01/2022   Myofascial pain 02/10/2022   Lumbar spondylosis 01/03/2022   Acquired clawfoot, left foot 12/16/2021   Acquired clawfoot, right foot 12/16/2021   Claw foot 12/14/2021   Gait abnormality 12/10/2021   Spinal stenosis at L4-L5 level 12/10/2021   Ataxia 11/22/2021   Lightheadedness 11/02/2021   Claudication of lower extremity (HCC) 10/21/2021   Atrophy of calf muscles 10/21/2021   Weakness generalized 10/21/2021   Focal neurological deficit 10/21/2021   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   Pain in joint of right hip  09/14/2021   High arches 07/22/2021   Hammertoes of both feet 07/22/2021   Neuropathy 07/22/2021   Constipation 05/28/2021   Disequilibrium 05/28/2021   History of colonic polyps 05/28/2021   Renal cyst 05/28/2021   Gastroesophageal reflux disease without esophagitis 05/05/2021   Ingrowing left great toenail 02/15/2021   S/P lumbar fusion 12/24/2020   Spinal stenosis of lumbar region 12/04/2020   Acquired complex renal cyst 11/23/2020   Functional gait abnormality 11/04/2020   Idiopathic neuropathy 11/02/2020   Tinea pedis of both feet 10/28/2020   Loop - Medtronic Linq 10/22/2020 10/22/2020   Benign hypertension with 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) 10/05/2020   Fall with injury 10/05/2020   Hematoma 09/16/2020   Normocytic anemia 09/14/2020   Leukocytosis 09/14/2020   AF (paroxysmal atrial fibrillation) (HCC) 09/14/2020   Chronic anticoagulation 09/14/2020   Hyponatremia 09/14/2020   Pain in left foot 01/22/2020   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   Pain in joint of right knee 12/27/2019   Recurrent falls 11/17/2019   Paroxysmal atrial fibrillation (HCC) 10/22/2019   Pain in right foot 10/14/2019   Falls 10/07/2019   Ataxia involving legs 08/12/2019   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   Urge incontinence 04/13/2018   Urinary urgency 04/13/2018   Pain in left knee 03/01/2018   Other intervertebral disc degeneration, lumbar region 06/16/2017   Acquired bilateral hammer toes 10/05/2015   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   Generalized anxiety disorder 05/12/2015   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   History of left-sided carotid endarterectomy 04/10/2014   Essential hypertension 04/10/2014   Occlusion and stenosis of carotid artery without mention of cerebral infarction 12/15/2011   Habitual alcohol use 12/01/2011   Carotid artery  stenosis, symptomatic 11/30/2011   Cerebral infarction (HCC) 11/29/2011   Mixed hyperlipidemia     PCP: Bernardino Cone  REFERRING PROVIDER: Bernardino Cone  REFERRING DIAG: weakness, multiple falls  Rationale for Evaluation and Treatment: Rehabilitation  THERAPY DIAG:  Other abnormalities of gait and mobility  Repeated falls  Muscle weakness (generalized)  Contracture of muscle, multiple sites  ONSET DATE: 10/07/22  SUBJECTIVE:  SUBJECTIVE STATEMENT: I donr need to do the NuStep, I did 15 minutes at home 10 Min L6 then L6 LE only 5 min No Falls   Patient asking about plan, he reports that he is worried that he will not be able to stay independent and live at home if he stops, he has no equipment and with the right knee's inability to extend and the left knee having issues with buckling due to neuropathy, he is worried about more falls and the strain on his wife  Doing all right, had one fall, fell in the shower yesterday. Did exercises before the showers, legs gave out  on him  PERTINENT HISTORY:  Cerebral infarct 10/21/21, lumbar fusion, recurrent falls, has significant neuropathy and this is the reason for most of his falls   PAIN:  Are you having pain? Yes: NPRS scale: 0/10 Pain location: right knee Pain description: ache  Aggravating factors: being on my feet  Relieving factors: movement  PRECAUTIONS: Fall  WEIGHT BEARING RESTRICTIONS: No  FALLS:  Has patient fallen in last 6 months? Yes. Number of falls every few days   LIVING ENVIRONMENT: Lives with: lives with their spouse Lives in: House/apartment Stairs: Yes: Internal: 20 steps; on right going up Has following equipment at home: [name] - 4 wheeled, Wheelchair (manual), shower chair, Tour manager, Grab bars, Ramped entry, and  trekking pole  OCCUPATION: Retired  PLOF: Independent, prior to a year ago he was driving used trekking poles to walk  PATIENT GOALS: live at home  NEXT MD VISIT:   OBJECTIVE:   DIAGNOSTIC FINDINGS:    LUMBAR SPINE FINDINGS: 1. Wide laminectomy and posterior pedicle screw and rod fixation at L4-5 without residual or recurrent stenosis. 2. Progressive adjacent level disease at L3-4 with moderate right and mild left subarticular narrowing and moderate foraminal stenosis bilaterally. Progression is predominantly due to facet disease 3. Progressive moderate facet hypertrophy at L1-2 with a progressive rightward disc protrusion resulting in progressive moderate right foraminal stenosis. Asymmetric endplate marrow edema on the right at L1-2 is consistent with progressive disease as well. 4. Otherwise stable degenerative change without other focal stenosis.   THORACIC SPINE FINDINGS:  On sagittal views the vertebral bodies have normal height and alignment.  Hemangioma within T9 vertebral body and slightly within T10 vertebral body.  Anterior kyphosis.  Mild scoliosis convex right centered at T8 level. The spinal cord is normal in size and appearance. The paraspinal soft tissues are unremarkable.     On axial views there is no spinal stenosis or foraminal narrowing.  Limited views of the aorta, kidneys, liver, lungs and paraspinal muscles are notable for multiple large right renal cysts ranging in size from 1.5 to 7 cm.   COGNITION: Overall cognitive status: Within functional limits for tasks assessed     SENSATION: WFL  MUSCLE LENGTH: Hamstrings: mod tightness on both sides  POSTURE: rounded shoulders and forward head  LOWER EXTREMITY ROM:   the right knee has HS, has no quad tendon on the right, unable to lift the leg, left leg WFL's , left hip and right hip and ankles WFL's   LOWER EXTREMITY MMT: 4-/5 for the LE's except for the right knee extension UPPER EXTREMITY MMT:   4-/5 for shoulders and elbows   FUNCTIONAL TESTS:  5XSTS: 83 seconds really has to use his hands and is unsteady with standing Cannot stand on his own without holding onto something due to poor balance   Transfers:  needs set up and  MinA for safety, is impulsive and unsafe at times. GAIT: He is w/c dependent    TODAY'S TREATMENT:                                                                                                                              DATE:  09/26/23 At Mat Seated Crunches 2x15 Seated Trunk rotations with weighted ball Fitter press all bands w/ LLE 2x15  1black 1 blue band RLE 2x15 Military press 3lb 2x15  HS curls black 2x15   Rows black band 2x10  Horizontal abduction blue   Ankle R blue 2x15 15lb shoulder Ext 2x15 Biceps curls 10lb 2x10 Triceps Ext 20lb 2x15   09/21/23 Nustep level 6 x 7 minutes with arms and then 4 minutes without arms Rows 15lb 2x15 Extension 15lb 2x15 15# biceps 20# triceps Chest press 15lb HS curls 55lb 2x15, RLE 20b 2x15 Leg Ext LLE 15lb 2x15 Fitter press RLE 1 blue 1black, LLE all bands   09/12/23 Nustep level 6 x 7 minutes with arms and then 4 minutes without arms At mat able  Seated sit ups Front raises blue ball  Horiz abd black Fitter press all bands w/ LLE 2x15  1black 1 blue band RLE 2x15 Trunk rotations with blue ball  Rows black band 2x10 OHP 3lb dumbell   09/08/23 Nustep level 6 x 7 minutes with arms and then 4 minutes without arms Rows 15lb 2x15 Extension 15lb 2x15 15# biceps 20# triceps Chest press 15lb At mat able  Seated sit ups Trunk rotations with blue ball  HS curls blue x 15 08/31/23 Nustep level 6 x 7 minutes with arms and then 4 minutes without arms 15# rows Lats 20lb 15# chest press 15# biceps 20# triceps 55# leg curls , left only 20# Leg Ext 10lb LLE 2x15 LLE leg Ext 10lb 2x15 Seated hip abd black band   08/29/23 Nustep level 6 x 8 minutes with arms and then 4 minutes without  arms 15# rows 15# lats 15# chest press 15# biceps 20# triceps 55# leg curls , left only 25# Supine feet on ball, bridge, isometric abs Black tband clamshells Education on safety  08/24/23 Nustep level 6 x 7 minutes 15# Rows 15# straight arm  15# chest press 15# biceps 20# triceps Passive stretch right knee into extension Feet on ball K2C, rotation, bridges, isometric abs Tband clamshells Ball b/n knees with bridge Black tband hip extension  08/22/23 Nustep level 6 x 6 minutes, then level 5 LE only x 5 minutes LEg curls 45# 2x15 left only 25# 2x10 LEft leg ext 10# 2x15 15# row, extension 10# chest press 15# biceps 20# triceps  08/18/23 Nustep level 6 x 6 minutes, LE only level 5 x 3 minutes Leg press 60lb 2x15 Chest press 10lb 2x15 Biceps Curls 10lb 2x10 Rows & Ext 15lb 2x15 At MAT  OHP blue ball 2x15  Seated trunk rotations blue ball   HS curls green 2x15  each 08/15/23 Nustep level 6 x 10 minutes In Pbars standing trying to get him to extend the right hip and have knee locked to bear weight on the right without the knee buckling LEg press 60# 2x15, then left only 40# Supine feet on ball K2C, rotation, bridges, isometric abs Passive stretch right knee Black tband clamshells Black tband right hip extension Passive stretch right knee into extension Bridges  PATIENT EDUCATION:  Education details: POC Person educated: Patient Education method: Explanation Education comprehension: verbalized understanding  HOME EXERCISE PROGRAM:  ASSESSMENT:  CLINICAL IMPRESSION: Pt enters doing well, upper and lower body strength within ability. No repots of increase pain. Tactile cue needed to keep shoulders down and back with rows and extensions. Core weakness with trunk rotations. Pt remains impulsive and unsafe with transfers  In the long run he is impulsive and unsafe and cannot exercise on his own to maintain his level, he is at a high risk for falls and there is no gym  that has the ability to assist him. Session completed at mat table to eliminate trunk support for improved core stabilization. Core weakness with seated OHP.  Pt did report a fall yesterday stated he was standing pulling his pants up, I advised pt that he shouldn't put weight in his RLE, He ws adamant that he could put a little on it.     Justifying Skilled Maintenance for Rehab Services:   The goal of a skilled maintenance therapy program is to maintain the patient's current functional status or to prevent or slow deterioration. If a patient who is receiving restorative therapy then requires skilled maintenance therapy based on the skills and judgment of the therapist, development of a maintenance program would occur during the last visit for restorative treatment.    Does the patient need treatment that necessitates skilled services that can only be provided by a skilled provider? Can a caregiver provide these services why or why not? 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  He is impulsive and at times very unsafe he could not do this on his own and his wife could not help him., history of COPD, CKD, depression, HTN, kyphosis, a-fib, PVD, spinal stenosis, and stroke as well as RC repair.  These limit the patient's independent carryover with PT  exercises and require max cueing for optimal performance of safety and to strengthen other mms for his future in therapy sessions.    The patient requires the skill of PT to help with ongoing exercise and mobility to prevent rapid and significant decline in functional mobility, strength, and to limit falls with his safety.  Therefore, continued skilled maintenance therapy is recommended for this patient.    OBJECTIVE IMPAIRMENTS: Abnormal gait, cardiopulmonary status  limiting activity, decreased activity tolerance, decreased balance, decreased coordination, decreased endurance, decreased mobility, difficulty walking, decreased ROM, decreased strength, improper body mechanics, postural dysfunction, and poor safety.   REHAB POTENTIAL: Fair dependent on carry over and patient compliance  CLINICAL DECISION MAKING: Evolving/moderate complexity  EVALUATION COMPLEXITY: Moderate  GOALS: Goals for skilled maintenance:     Long Term Goals: Target Date 12/13/23     The patient will preserve functional mobility within the home environment. Baseline: uses a w/c  Goal Status: NEW   2. The patient will maintain strength for transfers.            Baseline: able to perform with set up and at times requires mod A and at other time may need Max A due to poor safety and impulsiveness            Goal Status: NEW   3. The patient will minimize fall frequency.            Baseline: he has been having about a fall a month            Goal Status: NEW   4. Safe with HEP at home and with equipment he has            Baseline: does HEP             Goal Status: NEW    PLAN:  PT FREQUENCY: 2x/week  PT DURATION: 12 weeks  PLANNED INTERVENTIONS: Therapeutic exercises, Therapeutic activity, Neuromuscular re-education, Balance training, Gait training, Patient/Family education, Self Care, Joint mobilization, Stair training, Cryotherapy, Moist heat, and Manual therapy.  PLAN FOR NEXT SESSION: working on the next level for Mr. Gascoigne being a maintenance program, will work to get this all set up to Medicare guidelines next visit renewal with maintenance program one time a week  Tanda KANDICE Sorrow, PTA

## 2023-10-03 ENCOUNTER — Ambulatory Visit: Admitting: Physical Therapy

## 2023-10-06 ENCOUNTER — Ambulatory Visit: Admitting: Physical Therapy

## 2023-10-06 ENCOUNTER — Encounter: Payer: Self-pay | Admitting: Physical Therapy

## 2023-10-06 DIAGNOSIS — R2689 Other abnormalities of gait and mobility: Secondary | ICD-10-CM

## 2023-10-06 DIAGNOSIS — M25561 Pain in right knee: Secondary | ICD-10-CM

## 2023-10-06 DIAGNOSIS — M6281 Muscle weakness (generalized): Secondary | ICD-10-CM

## 2023-10-06 DIAGNOSIS — R296 Repeated falls: Secondary | ICD-10-CM

## 2023-10-06 DIAGNOSIS — M6249 Contracture of muscle, multiple sites: Secondary | ICD-10-CM

## 2023-10-06 DIAGNOSIS — M5459 Other low back pain: Secondary | ICD-10-CM

## 2023-10-06 NOTE — Therapy (Signed)
 OUTPATIENT PHYSICAL THERAPY TREATMENT     Patient Name: Brandon Mendoza. MRN: [id number] DOB:Aug 16, 1940, 83 y.o., male Today's Date: 10/06/2023  END OF SESSION:  PT End of Session - 10/06/23 0846     Visit Number 38    Date for PT Re-Evaluation 10/08/23    PT Start Time 0845    PT Stop Time 0930    PT Time Calculation (min) 45 min    Activity Tolerance Patient tolerated treatment well    Behavior During Therapy [facility] for tasks assessed/performed;Impulsive          Past Medical History:  Diagnosis Date   Acquired clawfoot, right foot 12/16/2021   Anxiety    Atrophy of calf muscles 10/21/2021   Duplex calves was negative for significant blockage EMG was done at [facility] MRI lumbar spine was also done at [facility]   Carotid artery occlusion    CKD (chronic kidney disease)    Stage 3   COPD (chronic obstructive pulmonary disease) (HCC)    Depression    Dysrhythmia    PAF, atrial tachycardia   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   The spinal cord appears normal. 2.   No spinal stenosis. 3.   Mild multilevel degenerative changes as detailed above that do not lead to spinal stenosis or nerve root compression. 4.   T2 hyperintense foci within the pons consistent with chronic microvascular ischemic changes.   Loop - Medtronic Linq 10/22/2020 10/22/2020   Nocturia    Paroxysmal atrial fibrillation (HCC)    Peripheral neuropathy    Peripheral vascular disease (HCC)    Sleep apnea    on 6 cm, nasal pillow   Spinal stenosis    Stroke (HCC) 05/01/2011   ischemic   Weakness generalized 10/21/2021   Severe, legs and arms   Past Surgical History:  Procedure Laterality Date   CAROTID ENDARTERECTOMY  12/09/11   Left cea   ENDARTERECTOMY  12/09/2011    Procedure: ENDARTERECTOMY CAROTID;  Surgeon: Krystal JULIANNA Doing, MD;  Location: [facility] OR;  Service: Vascular;  Laterality: Left;   EYE SURGERY     rt retina detachment   HAMMER TOE SURGERY  2004   HERNIA REPAIR  2006   I & D EXTREMITY Right 10/07/2022   Procedure: IRRIGATION AND DEBRIDEMENT EXTREMITY WITH WOUND CLOSURE;  Surgeon: Burnetta Aures, MD;  Location: [facility] ORS;  Service: Orthopedics;  Laterality: Right;   QUADRICEPS TENDON REPAIR Right 07/12/2022   Procedure: REPAIR QUADRICEP TENDON;  Surgeon: Ernie Cough, MD;  Location: [facility] ORS;  Service: Orthopedics;  Laterality: Right;   QUADRICEPS TENDON REPAIR Right 08/30/2022   Procedure: REPAIR QUADRICEP TENDON;  Surgeon: Ernie Cough, MD;  Location: [facility] ORS;  Service: Orthopedics;  Laterality: Right;   SHOULDER ARTHROSCOPY W/ ROTATOR CUFF REPAIR  2010   TONSILLECTOMY     Patient Active Problem List   Diagnosis Date Noted   Coronary artery calcification 05/09/2023   Rupture of quadriceps tendon 11/03/2022   Wound dehiscence 10/07/2022   Quadriceps tendon rupture, right, subsequent encounter 08/30/2022   Quadriceps tendon rupture, right, initial encounter 07/12/2022   Unable to care for self 07/08/2022   Dehydration 07/08/2022   Depression 07/08/2022   COPD (  chronic obstructive pulmonary disease) (HCC) 07/08/2022   Sialoadenitis of submandibular gland 04/28/2022   Macular degeneration 03/01/2022   Myofascial pain 02/10/2022   Lumbar spondylosis 01/03/2022   Acquired clawfoot, left foot 12/16/2021   Acquired clawfoot, right foot 12/16/2021   Claw foot 12/14/2021   Gait abnormality 12/10/2021   Spinal stenosis at L4-L5 level 12/10/2021   Ataxia 11/22/2021   Lightheadedness 11/02/2021   Claudication of lower extremity (HCC) 10/21/2021   Atrophy of calf muscles 10/21/2021   Weakness generalized 10/21/2021   Focal neurological deficit 10/21/2021   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   Pain in joint of right hip  09/14/2021   High arches 07/22/2021   Hammertoes of both feet 07/22/2021   Neuropathy 07/22/2021   Constipation 05/28/2021   Disequilibrium 05/28/2021   History of colonic polyps 05/28/2021   Renal cyst 05/28/2021   Gastroesophageal reflux disease without esophagitis 05/05/2021   Ingrowing left great toenail 02/15/2021   S/P lumbar fusion 12/24/2020   Spinal stenosis of lumbar region 12/04/2020   Acquired complex renal cyst 11/23/2020   Functional gait abnormality 11/04/2020   Idiopathic neuropathy 11/02/2020   Tinea pedis of both feet 10/28/2020   Loop - Medtronic Linq 10/22/2020 10/22/2020   Benign hypertension with 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) 10/05/2020   Fall with injury 10/05/2020   Hematoma 09/16/2020   Normocytic anemia 09/14/2020   Leukocytosis 09/14/2020   AF (paroxysmal atrial fibrillation) (HCC) 09/14/2020   Chronic anticoagulation 09/14/2020   Hyponatremia 09/14/2020   Pain in left foot 01/22/2020   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   Pain in joint of right knee 12/27/2019   Recurrent falls 11/17/2019   Paroxysmal atrial fibrillation (HCC) 10/22/2019   Pain in right foot 10/14/2019   Falls 10/07/2019   Ataxia involving legs 08/12/2019   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   Urge incontinence 04/13/2018   Urinary urgency 04/13/2018   Pain in left knee 03/01/2018   Other intervertebral disc degeneration, lumbar region 06/16/2017   Acquired bilateral hammer toes 10/05/2015   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   Generalized anxiety disorder 05/12/2015   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   History of left-sided carotid endarterectomy 04/10/2014   Essential hypertension 04/10/2014   Occlusion and stenosis of carotid artery without mention of cerebral infarction 12/15/2011   Habitual alcohol use 12/01/2011   Carotid artery  stenosis, symptomatic 11/30/2011   Cerebral infarction (HCC) 11/29/2011   Mixed hyperlipidemia     PCP: Bernardino Cone  REFERRING PROVIDER: Bernardino Cone  REFERRING DIAG: weakness, multiple falls  Rationale for Evaluation and Treatment: Rehabilitation  THERAPY DIAG:  Other abnormalities of gait and mobility  Repeated falls  Muscle weakness (generalized)  Contracture of muscle, multiple sites  Acute pain of right knee  Other low back pain  ONSET DATE: 10/07/22  SUBJECTIVE:  SUBJECTIVE STATEMENT: I already did NuStep, I did 15 minutes at home 10 Min L7 then L6 LE only 5 min No Falls   Patient asking about plan, he reports that he is worried that he will not be able to stay independent and live at home if he stops, he has no equipment and with the right knee's inability to extend and the left knee having issues with buckling due to neuropathy, he is worried about more falls and the strain on his wife  Doing all right, had one fall, fell in the shower yesterday. Did exercises before the showers, legs gave out  on him  PERTINENT HISTORY:  Cerebral infarct 10/21/21, lumbar fusion, recurrent falls, has significant neuropathy and this is the reason for most of his falls   PAIN:  Are you having pain? Yes: NPRS scale: 0/10 Pain location: right knee Pain description: ache  Aggravating factors: being on my feet  Relieving factors: movement  PRECAUTIONS: Fall  WEIGHT BEARING RESTRICTIONS: No  FALLS:  Has patient fallen in last 6 months? Yes. Number of falls every few days   LIVING ENVIRONMENT: Lives with: lives with their spouse Lives in: House/apartment Stairs: Yes: Internal: 20 steps; on right going up Has following equipment at home: [name] - 4 wheeled, Wheelchair (manual), shower chair,  Tour manager, Grab bars, Ramped entry, and trekking pole  OCCUPATION: Retired  PLOF: Independent, prior to a year ago he was driving used trekking poles to walk  PATIENT GOALS: live at home  NEXT MD VISIT:   OBJECTIVE:   DIAGNOSTIC FINDINGS:    LUMBAR SPINE FINDINGS: 1. Wide laminectomy and posterior pedicle screw and rod fixation at L4-5 without residual or recurrent stenosis. 2. Progressive adjacent level disease at L3-4 with moderate right and mild left subarticular narrowing and moderate foraminal stenosis bilaterally. Progression is predominantly due to facet disease 3. Progressive moderate facet hypertrophy at L1-2 with a progressive rightward disc protrusion resulting in progressive moderate right foraminal stenosis. Asymmetric endplate marrow edema on the right at L1-2 is consistent with progressive disease as well. 4. Otherwise stable degenerative change without other focal stenosis.   THORACIC SPINE FINDINGS:  On sagittal views the vertebral bodies have normal height and alignment.  Hemangioma within T9 vertebral body and slightly within T10 vertebral body.  Anterior kyphosis.  Mild scoliosis convex right centered at T8 level. The spinal cord is normal in size and appearance. The paraspinal soft tissues are unremarkable.     On axial views there is no spinal stenosis or foraminal narrowing.  Limited views of the aorta, kidneys, liver, lungs and paraspinal muscles are notable for multiple large right renal cysts ranging in size from 1.5 to 7 cm.   COGNITION: Overall cognitive status: Within functional limits for tasks assessed     SENSATION: WFL  MUSCLE LENGTH: Hamstrings: mod tightness on both sides  POSTURE: rounded shoulders and forward head  LOWER EXTREMITY ROM:   the right knee has HS, has no quad tendon on the right, unable to lift the leg, left leg WFL's , left hip and right hip and ankles WFL's   LOWER EXTREMITY MMT: 4-/5 for the LE's except for the  right knee extension UPPER EXTREMITY MMT:  4-/5 for shoulders and elbows   FUNCTIONAL TESTS:  5XSTS: 83 seconds really has to use his hands and is unsteady with standing Cannot stand on his own without holding onto something due to poor balance   Transfers:  needs set up and MinA for safety,  is impulsive and unsafe at times. GAIT: He is w/c dependent    TODAY'S TREATMENT:                                                                                                                              DATE:  10/06/23 Rows 15lb x15, x20 Shoulder Ext 15lb 2x20 Curls 10lb x20, x15 Chest press 15lb 2x20 Leg press 50lb x 15, 40lb 2x15 At Mat Seated Crunches 2x15 Seated Trunk rotations with weighted ball Seated OHP blue ball 2x15 Horiz abd blue 2x10 Hip and black 2x15  09/26/23 At Mat Seated Crunches 2x15 Seated Trunk rotations with weighted ball Fitter press all bands w/ LLE 2x15  1black 1 blue band RLE 2x15 Military press 3lb 2x15  HS curls black 2x15   Rows black band 2x10  Horizontal abduction blue   Ankle R blue 2x15 15lb shoulder Ext 2x15 Biceps curls 10lb 2x10 Triceps Ext 20lb 2x15   09/21/23 Nustep level 6 x 7 minutes with arms and then 4 minutes without arms Rows 15lb 2x15 Extension 15lb 2x15 15# biceps 20# triceps Chest press 15lb HS curls 55lb 2x15, RLE 20b 2x15 Leg Ext LLE 15lb 2x15 Fitter press RLE 1 blue 1black, LLE all bands   09/12/23 Nustep level 6 x 7 minutes with arms and then 4 minutes without arms At mat able  Seated sit ups Front raises blue ball  Horiz abd black Fitter press all bands w/ LLE 2x15  1black 1 blue band RLE 2x15 Trunk rotations with blue ball  Rows black band 2x10 OHP 3lb dumbell   09/08/23 Nustep level 6 x 7 minutes with arms and then 4 minutes without arms Rows 15lb 2x15 Extension 15lb 2x15 15# biceps 20# triceps Chest press 15lb At mat able  Seated sit ups Trunk rotations with blue ball  HS curls blue x  15 08/31/23 Nustep level 6 x 7 minutes with arms and then 4 minutes without arms 15# rows Lats 20lb 15# chest press 15# biceps 20# triceps 55# leg curls , left only 20# Leg Ext 10lb LLE 2x15 LLE leg Ext 10lb 2x15 Seated hip abd black band   08/29/23 Nustep level 6 x 8 minutes with arms and then 4 minutes without arms 15# rows 15# lats 15# chest press 15# biceps 20# triceps 55# leg curls , left only 25# Supine feet on ball, bridge, isometric abs Black tband clamshells Education on safety  08/24/23 Nustep level 6 x 7 minutes 15# Rows 15# straight arm  15# chest press 15# biceps 20# triceps Passive stretch right knee into extension Feet on ball K2C, rotation, bridges, isometric abs Tband clamshells Ball b/n knees with bridge Black tband hip extension  08/22/23 Nustep level 6 x 6 minutes, then level 5 LE only x 5 minutes LEg curls 45# 2x15 left only 25# 2x10 LEft leg ext 10# 2x15 15# row, extension 10# chest press 15# biceps 20# triceps  08/18/23 Nustep level 6  x 6 minutes, LE only level 5 x 3 minutes Leg press 60lb 2x15 Chest press 10lb 2x15 Biceps Curls 10lb 2x10 Rows & Ext 15lb 2x15 At MAT  OHP blue ball 2x15  Seated trunk rotations blue ball   HS curls green 2x15 each 08/15/23 Nustep level 6 x 10 minutes In Pbars standing trying to get him to extend the right hip and have knee locked to bear weight on the right without the knee buckling LEg press 60# 2x15, then left only 40# Supine feet on ball K2C, rotation, bridges, isometric abs Passive stretch right knee Black tband clamshells Black tband right hip extension Passive stretch right knee into extension Bridges  PATIENT EDUCATION:  Education details: POC Person educated: Patient Education method: Explanation Education comprehension: verbalized understanding  HOME EXERCISE PROGRAM:  ASSESSMENT:  CLINICAL IMPRESSION: Pt enters doing fair, upper and lower body strength within ability. He enters  haven already warmed up. No repots of increase pain. Tactile cue needed to keep shoulders down and back with rows and extensions. Core weakness with trunk rotations. Increase reps tolerated with some interventions. Pt remains impulsive and unsafe with transfers  In the long run he is impulsive and unsafe and cannot exercise on his own to maintain his level, he is at a high risk for falls and there is no gym that has the ability to assist him. Session completed at mat table to eliminate trunk support for improved core stabilization. Core weakness with seated OHP.  Pt did report a fall yesterday stated he was standing pulling his pants up, I advised pt that he shouldn't put weight in his RLE, He ws adamant that he could put a little on it.     Justifying Skilled Maintenance for Rehab Services:   The goal of a skilled maintenance therapy program is to maintain the patient's current functional status or to prevent or slow deterioration. If a patient who is receiving restorative therapy then requires skilled maintenance therapy based on the skills and judgment of the therapist, development of a maintenance program would occur during the last visit for restorative treatment.    Does the patient need treatment that necessitates skilled services that can only be provided by a skilled provider? Can a caregiver provide these services why or why not? 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  He is impulsive and at times very unsafe he could not do this on his own and his wife could not help him., history of COPD, CKD, depression, HTN, kyphosis, a-fib, PVD, spinal stenosis, and stroke as well as RC repair.  These limit the patient's independent carryover with PT  exercises and require max cueing for optimal performance of  safety and to strengthen other mms for his future in therapy sessions.    The patient requires the skill of PT to help with ongoing exercise and mobility to prevent rapid and significant decline in functional mobility, strength, and to limit falls with his safety.  Therefore, continued skilled maintenance therapy is recommended for this patient.    OBJECTIVE IMPAIRMENTS: Abnormal gait, cardiopulmonary status limiting activity, decreased activity tolerance, decreased balance, decreased coordination, decreased endurance, decreased mobility, difficulty walking, decreased ROM, decreased strength, improper body mechanics, postural dysfunction, and poor safety.   REHAB POTENTIAL: Fair dependent  on carry over and patient compliance  CLINICAL DECISION MAKING: Evolving/moderate complexity  EVALUATION COMPLEXITY: Moderate  GOALS: Goals for skilled maintenance:     Long Term Goals: Target Date 12/13/23     The patient will preserve functional mobility within the home environment. Baseline: uses a w/c            Goal Status: Progressing 10/06/23   2. The patient will maintain strength for transfers.            Baseline: able to perform with set up and at times requires mod A and at other time may need Max A due to poor safety and impulsiveness            Goal Status: Ongoing 10/06/23   3. The patient will minimize fall frequency.            Baseline: he has been having about a fall a month            Goal Status: NEW   4. Safe with HEP at home and with equipment he has            Baseline: does HEP             Goal Status: NEW    PLAN:  PT FREQUENCY: 2x/week  PT DURATION: 12 weeks  PLANNED INTERVENTIONS: Therapeutic exercises, Therapeutic activity, Neuromuscular re-education, Balance training, Gait training, Patient/Family education, Self Care, Joint mobilization, Stair training, Cryotherapy, Moist heat, and Manual therapy.  PLAN FOR NEXT SESSION: working on the next level for Mr.  Pickrel being a maintenance program, will work to get this all set up to Medicare guidelines next visit renewal with maintenance program one time a week  Tanda KANDICE Sorrow, PTA

## 2023-10-09 ENCOUNTER — Ambulatory Visit

## 2023-10-09 DIAGNOSIS — I480 Paroxysmal atrial fibrillation: Secondary | ICD-10-CM

## 2023-10-09 LAB — CUP PACEART REMOTE DEVICE CHECK
Date Time Interrogation Session: 20250713233038
Implantable Pulse Generator Implant Date: 20220728

## 2023-10-10 ENCOUNTER — Encounter: Payer: Self-pay | Admitting: Physical Therapy

## 2023-10-10 ENCOUNTER — Ambulatory Visit: Admitting: Physical Therapy

## 2023-10-10 DIAGNOSIS — R296 Repeated falls: Secondary | ICD-10-CM

## 2023-10-10 DIAGNOSIS — R2689 Other abnormalities of gait and mobility: Secondary | ICD-10-CM

## 2023-10-10 DIAGNOSIS — M25561 Pain in right knee: Secondary | ICD-10-CM

## 2023-10-10 DIAGNOSIS — M6249 Contracture of muscle, multiple sites: Secondary | ICD-10-CM

## 2023-10-10 DIAGNOSIS — M6281 Muscle weakness (generalized): Secondary | ICD-10-CM

## 2023-10-10 DIAGNOSIS — M5459 Other low back pain: Secondary | ICD-10-CM

## 2023-10-10 NOTE — Therapy (Signed)
 OUTPATIENT PHYSICAL THERAPY TREATMENT     Patient Name: Brandon Mendoza. MRN: [id number] DOB:25-Dec-1940, 83 y.o., male Today's Date: 10/10/2023  END OF SESSION:  PT End of Session - 10/10/23 0848     Visit Number 39    Date for PT Re-Evaluation 11/10/23    Authorization Type Medicare    PT Start Time 0843    PT Stop Time 0928    PT Time Calculation (min) 45 min    Activity Tolerance Patient tolerated treatment well    Behavior During Therapy [facility] for tasks assessed/performed;Impulsive          Past Medical History:  Diagnosis Date   Acquired clawfoot, right foot 12/16/2021   Anxiety    Atrophy of calf muscles 10/21/2021   Duplex calves was negative for significant blockage EMG was done at [facility] MRI lumbar spine was also done at [facility]   Carotid artery occlusion    CKD (chronic kidney disease)    Stage 3   COPD (chronic obstructive pulmonary disease) (HCC)    Depression    Dysrhythmia    PAF, atrial tachycardia   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   The spinal cord appears normal. 2.   No spinal stenosis. 3.   Mild multilevel degenerative changes as detailed above that do not lead to spinal stenosis or nerve root compression. 4.   T2 hyperintense foci within the pons consistent with chronic microvascular ischemic changes.   Loop - Medtronic Linq 10/22/2020 10/22/2020   Nocturia    Paroxysmal atrial fibrillation (HCC)    Peripheral neuropathy    Peripheral vascular disease (HCC)    Sleep apnea    on 6 cm, nasal pillow   Spinal stenosis    Stroke (HCC) 05/01/2011   ischemic   Weakness generalized 10/21/2021   Severe, legs and arms   Past Surgical History:  Procedure Laterality Date   CAROTID ENDARTERECTOMY  12/09/11   Left cea    ENDARTERECTOMY  12/09/2011   Procedure: ENDARTERECTOMY CAROTID;  Surgeon: Krystal JULIANNA Doing, MD;  Location: [facility] OR;  Service: Vascular;  Laterality: Left;   EYE SURGERY     rt retina detachment   HAMMER TOE SURGERY  2004   HERNIA REPAIR  2006   I & D EXTREMITY Right 10/07/2022   Procedure: IRRIGATION AND DEBRIDEMENT EXTREMITY WITH WOUND CLOSURE;  Surgeon: Burnetta Aures, MD;  Location: [facility] ORS;  Service: Orthopedics;  Laterality: Right;   QUADRICEPS TENDON REPAIR Right 07/12/2022   Procedure: REPAIR QUADRICEP TENDON;  Surgeon: Ernie Cough, MD;  Location: [facility] ORS;  Service: Orthopedics;  Laterality: Right;   QUADRICEPS TENDON REPAIR Right 08/30/2022   Procedure: REPAIR QUADRICEP TENDON;  Surgeon: Ernie Cough, MD;  Location: [facility] ORS;  Service: Orthopedics;  Laterality: Right;   SHOULDER ARTHROSCOPY W/ ROTATOR CUFF REPAIR  2010   TONSILLECTOMY     Patient Active Problem List   Diagnosis Date Noted   Coronary artery calcification 05/09/2023   Rupture of quadriceps tendon 11/03/2022   Wound dehiscence 10/07/2022   Quadriceps tendon rupture, right, subsequent encounter 08/30/2022   Quadriceps tendon rupture, right, initial encounter 07/12/2022   Unable to care for self 07/08/2022   Dehydration 07/08/2022  Depression 07/08/2022   COPD (chronic obstructive pulmonary disease) (HCC) 07/08/2022   Sialoadenitis of submandibular gland 04/28/2022   Macular degeneration 03/01/2022   Myofascial pain 02/10/2022   Lumbar spondylosis 01/03/2022   Acquired clawfoot, left foot 12/16/2021   Acquired clawfoot, right foot 12/16/2021   Claw foot 12/14/2021   Gait abnormality 12/10/2021   Spinal stenosis at L4-L5 level 12/10/2021   Ataxia 11/22/2021   Lightheadedness 11/02/2021   Claudication of lower extremity (HCC) 10/21/2021   Atrophy of calf muscles 10/21/2021   Weakness generalized 10/21/2021   Focal neurological deficit 10/21/2021   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    Pain in joint of right hip 09/14/2021   High arches 07/22/2021   Hammertoes of both feet 07/22/2021   Neuropathy 07/22/2021   Constipation 05/28/2021   Disequilibrium 05/28/2021   History of colonic polyps 05/28/2021   Renal cyst 05/28/2021   Gastroesophageal reflux disease without esophagitis 05/05/2021   Ingrowing left great toenail 02/15/2021   S/P lumbar fusion 12/24/2020   Spinal stenosis of lumbar region 12/04/2020   Acquired complex renal cyst 11/23/2020   Functional gait abnormality 11/04/2020   Idiopathic neuropathy 11/02/2020   Tinea pedis of both feet 10/28/2020   Loop - Medtronic Linq 10/22/2020 10/22/2020   Benign hypertension with 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) 10/05/2020   Fall with injury 10/05/2020   Hematoma 09/16/2020   Normocytic anemia 09/14/2020   Leukocytosis 09/14/2020   AF (paroxysmal atrial fibrillation) (HCC) 09/14/2020   Chronic anticoagulation 09/14/2020   Hyponatremia 09/14/2020   Pain in left foot 01/22/2020   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   Pain in joint of right knee 12/27/2019   Recurrent falls 11/17/2019   Paroxysmal atrial fibrillation (HCC) 10/22/2019   Pain in right foot 10/14/2019   Falls 10/07/2019   Ataxia involving legs 08/12/2019   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   Urge incontinence 04/13/2018   Urinary urgency 04/13/2018   Pain in left knee 03/01/2018   Other intervertebral disc degeneration, lumbar region 06/16/2017   Acquired bilateral hammer toes 10/05/2015   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   Generalized anxiety disorder 05/12/2015   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   History of left-sided carotid endarterectomy 04/10/2014   Essential hypertension 04/10/2014   Occlusion and stenosis of carotid artery without mention of cerebral infarction 12/15/2011   Habitual alcohol use  12/01/2011   Carotid artery stenosis, symptomatic 11/30/2011   Cerebral infarction (HCC) 11/29/2011   Mixed hyperlipidemia     PCP: Bernardino Cone  REFERRING PROVIDER: Bernardino Cone  REFERRING DIAG: weakness, multiple falls  Rationale for Evaluation and Treatment: Rehabilitation  THERAPY DIAG:  Other abnormalities of gait and mobility  Repeated falls  Contracture of muscle, multiple sites  Muscle weakness (generalized)  Acute pain of right knee  Other low back pain  ONSET DATE: 10/07/22  SUBJECTIVE:  SUBJECTIVE STATEMENT: Doing okay, denies falls, still doing my Nustep at home but it is hot in the garage  Patient asking about plan, he reports that he is worried that he will not be able to stay independent and live at home if he stops, he has no equipment and with the right knee's inability to extend and the left knee having issues with buckling due to neuropathy, he is worried about more falls and the strain on his wife  Doing all right, had one fall, fell in the shower yesterday. Did exercises before the showers, legs gave out  on him  PERTINENT HISTORY:  Cerebral infarct 10/21/21, lumbar fusion, recurrent falls, has significant neuropathy and this is the reason for most of his falls   PAIN:  Are you having pain? Yes: NPRS scale: 0/10 Pain location: right knee Pain description: ache  Aggravating factors: being on my feet  Relieving factors: movement  PRECAUTIONS: Fall  WEIGHT BEARING RESTRICTIONS: No  FALLS:  Has patient fallen in last 6 months? Yes. Number of falls every few days   LIVING ENVIRONMENT: Lives with: lives with their spouse Lives in: House/apartment Stairs: Yes: Internal: 20 steps; on right going up Has following equipment at home: [name] - 4 wheeled, Wheelchair  (manual), shower chair, Tour manager, Grab bars, Ramped entry, and trekking pole  OCCUPATION: Retired  PLOF: Independent, prior to a year ago he was driving used trekking poles to walk  PATIENT GOALS: live at home  NEXT MD VISIT:   OBJECTIVE:   DIAGNOSTIC FINDINGS:    LUMBAR SPINE FINDINGS: 1. Wide laminectomy and posterior pedicle screw and rod fixation at L4-5 without residual or recurrent stenosis. 2. Progressive adjacent level disease at L3-4 with moderate right and mild left subarticular narrowing and moderate foraminal stenosis bilaterally. Progression is predominantly due to facet disease 3. Progressive moderate facet hypertrophy at L1-2 with a progressive rightward disc protrusion resulting in progressive moderate right foraminal stenosis. Asymmetric endplate marrow edema on the right at L1-2 is consistent with progressive disease as well. 4. Otherwise stable degenerative change without other focal stenosis.   THORACIC SPINE FINDINGS:  On sagittal views the vertebral bodies have normal height and alignment.  Hemangioma within T9 vertebral body and slightly within T10 vertebral body.  Anterior kyphosis.  Mild scoliosis convex right centered at T8 level. The spinal cord is normal in size and appearance. The paraspinal soft tissues are unremarkable.     On axial views there is no spinal stenosis or foraminal narrowing.  Limited views of the aorta, kidneys, liver, lungs and paraspinal muscles are notable for multiple large right renal cysts ranging in size from 1.5 to 7 cm.   COGNITION: Overall cognitive status: Within functional limits for tasks assessed     SENSATION: WFL  MUSCLE LENGTH: Hamstrings: mod tightness on both sides  POSTURE: rounded shoulders and forward head  LOWER EXTREMITY ROM:   the right knee has HS, has no quad tendon on the right, unable to lift the leg, left leg WFL's , left hip and right hip and ankles WFL's   LOWER EXTREMITY MMT: 4-/5 for  the LE's except for the right knee extension UPPER EXTREMITY MMT:  4-/5 for shoulders and elbows   FUNCTIONAL TESTS:  5XSTS: 83 seconds really has to use his hands and is unsteady with standing Cannot stand on his own without holding onto something due to poor balance   Transfers:  needs set up and MinA for safety, is impulsive and unsafe at  times. GAIT: He is w/c dependent    TODAY'S TREATMENT:                                                                                                                              DATE:  10/10/23 15# biceps 3x15 15# chest press 3x15 15# straight arms  15# rows 20# triceps press overhead Discussion about his HEP with a 5# weight at home, trying to have him not do much overhead stuff 45# leg curls , right only 25# 2x15 each 5# standing marches with PT max A to keep leg bearing weight straight Patient wanted to weight so with a lot of effort got him on scale weight was 178# Passive stretch of the left knee to prevent contracture Feet on ball bridges Partial sit ups   10/06/23 Rows 15lb x15, x20 Shoulder Ext 15lb 2x20 Curls 10lb x20, x15 Chest press 15lb 2x20 Leg press 50lb x 15, 40lb 2x15 At Mat Seated Crunches 2x15 Seated Trunk rotations with weighted ball Seated OHP blue ball 2x15 Horiz abd blue 2x10 Hip and black 2x15  09/26/23 At Mat Seated Crunches 2x15 Seated Trunk rotations with weighted ball Fitter press all bands w/ LLE 2x15  1black 1 blue band RLE 2x15 Military press 3lb 2x15  HS curls black 2x15   Rows black band 2x10  Horizontal abduction blue   Ankle R blue 2x15 15lb shoulder Ext 2x15 Biceps curls 10lb 2x10 Triceps Ext 20lb 2x15   09/21/23 Nustep level 6 x 7 minutes with arms and then 4 minutes without arms Rows 15lb 2x15 Extension 15lb 2x15 15# biceps 20# triceps Chest press 15lb HS curls 55lb 2x15, RLE 20b 2x15 Leg Ext LLE 15lb 2x15 Fitter press RLE 1 blue 1black, LLE all bands   09/12/23 Nustep level  6 x 7 minutes with arms and then 4 minutes without arms At mat able  Seated sit ups Front raises blue ball  Horiz abd black Fitter press all bands w/ LLE 2x15  1black 1 blue band RLE 2x15 Trunk rotations with blue ball  Rows black band 2x10 OHP 3lb dumbell   09/08/23 Nustep level 6 x 7 minutes with arms and then 4 minutes without arms Rows 15lb 2x15 Extension 15lb 2x15 15# biceps 20# triceps Chest press 15lb At mat able  Seated sit ups Trunk rotations with blue ball  HS curls blue x 15 08/31/23 Nustep level 6 x 7 minutes with arms and then 4 minutes without arms 15# rows Lats 20lb 15# chest press 15# biceps 20# triceps 55# leg curls , left only 20# Leg Ext 10lb LLE 2x15 LLE leg Ext 10lb 2x15 Seated hip abd black band   08/29/23 Nustep level 6 x 8 minutes with arms and then 4 minutes without arms 15# rows 15# lats 15# chest press 15# biceps 20# triceps 55# leg curls , left only 25# Supine feet on ball, bridge, isometric abs Black tband clamshells Education on safety  08/24/23 Nustep level 6 x 7 minutes 15# Rows 15# straight arm  15# chest press 15# biceps 20# triceps Passive stretch right knee into extension Feet on ball K2C, rotation, bridges, isometric abs Tband clamshells Ball b/n knees with bridge Black tband hip extension  08/22/23 Nustep level 6 x 6 minutes, then level 5 LE only x 5 minutes LEg curls 45# 2x15 left only 25# 2x10 LEft leg ext 10# 2x15 15# row, extension 10# chest press 15# biceps 20# triceps  08/18/23 Nustep level 6 x 6 minutes, LE only level 5 x 3 minutes Leg press 60lb 2x15 Chest press 10lb 2x15 Biceps Curls 10lb 2x10 Rows & Ext 15lb 2x15 At MAT  OHP blue ball 2x15  Seated trunk rotations blue ball   HS curls green 2x15 each 08/15/23 Nustep level 6 x 10 minutes In Pbars standing trying to get him to extend the right hip and have knee locked to bear weight on the right without the knee buckling LEg press 60# 2x15, then left  only 40# Supine feet on ball K2C, rotation, bridges, isometric abs Passive stretch right knee Black tband clamshells Black tband right hip extension Passive stretch right knee into extension Bridges  PATIENT EDUCATION:  Education details: POC Person educated: Patient Education method: Explanation Education comprehension: verbalized understanding  HOME EXERCISE PROGRAM:  ASSESSMENT:  CLINICAL IMPRESSION: Patient has not had a fall in the past 3-4 weeks, he remains very unsafe at times and with any transfer needs min A or close CGA.  Tends to not understand that the right knee has no quad to hold the leg straight.  I did some marching today but it does require max A to manually keep the knee straight. Core weakness with trunk rotations. Increase reps tolerated with some interventions. Pt remains impulsive and unsafe with transfers  In the long run he is impulsive and unsafe and cannot exercise on his own to maintain his level, he is at a high risk for falls and there is no gym that has the ability to assist him. Session completed at mat table to eliminate trunk support for improved core stabilization. Core weakness with seated OHP.  Pt did report a fall yesterday stated he was standing pulling his pants up, I advised pt that he shouldn't put weight in his RLE, He ws adamant that he could put a little on it.     Justifying Skilled Maintenance for Rehab Services:   The goal of a skilled maintenance therapy program is to maintain the patient's current functional status or to prevent or slow deterioration. If a patient who is receiving restorative therapy then requires skilled maintenance therapy based on the skills and judgment of the therapist, development of a maintenance program would occur during the last visit for restorative treatment.    Does the patient need treatment that necessitates skilled services that can only be provided by a skilled provider? Can a caregiver provide these  services why or why not? 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  He is impulsive and at times very unsafe he could not do this on his own and his wife could not help him., history of COPD, CKD, depression, HTN, kyphosis, a-fib, PVD, spinal stenosis, and stroke as well as RC repair.  These  limit the patient's independent carryover with PT  exercises and require max cueing for optimal performance of safety and to strengthen other mms for his future in therapy sessions.    The patient requires the skill of PT to help with ongoing exercise and mobility to prevent rapid and significant decline in functional mobility, strength, and to limit falls with his safety.  Therefore, continued skilled maintenance therapy is recommended for this patient.    OBJECTIVE IMPAIRMENTS: Abnormal gait, cardiopulmonary status limiting activity, decreased activity tolerance, decreased balance, decreased coordination, decreased endurance, decreased mobility, difficulty walking, decreased ROM, decreased strength, improper body mechanics, postural dysfunction, and poor safety.   REHAB POTENTIAL: Fair dependent on carry over and patient compliance  CLINICAL DECISION MAKING: Evolving/moderate complexity  EVALUATION COMPLEXITY: Moderate  GOALS: Goals for skilled maintenance:     Long Term Goals: Target Date 12/13/23     The patient will preserve functional mobility within the home environment. Baseline: uses a w/c            Goal Status: Progressing 10/06/23   2. The patient will maintain strength for transfers.            Baseline: able to perform with set up and at times requires mod A and at other time may need Max A due to poor safety and impulsiveness            Goal Status: Ongoing 10/06/23   3. The patient will minimize  fall frequency.            Baseline: he has been having about a fall a month            Goal Status: ongoing 10/10/23   4. Safe with HEP at home and with equipment he has            Baseline: does HEP             Goal Status: ongoing 10/10/23   PLAN:  PT FREQUENCY: 2x/week  PT DURATION: 12 weeks  PLANNED INTERVENTIONS: Therapeutic exercises, Therapeutic activity, Neuromuscular re-education, Balance training, Gait training, Patient/Family education, Self Care, Joint mobilization, Stair training, Cryotherapy, Moist heat, and Manual therapy.  PLAN FOR NEXT SESSION: working on the next level for Mr. Walberg being a maintenance program, will work to get this all set up to Medicare guidelines next visit renewal with maintenance program one time a week  OBADIAH OZELL ORN, PT

## 2023-10-15 ENCOUNTER — Ambulatory Visit: Payer: Self-pay | Admitting: Cardiology

## 2023-10-16 ENCOUNTER — Encounter

## 2023-10-17 ENCOUNTER — Ambulatory Visit: Admitting: Physical Therapy

## 2023-10-19 ENCOUNTER — Ambulatory Visit: Payer: Self-pay | Admitting: Internal Medicine

## 2023-10-19 ENCOUNTER — Encounter: Payer: Self-pay | Admitting: Emergency Medicine

## 2023-10-19 ENCOUNTER — Ambulatory Visit
Admission: EM | Admit: 2023-10-19 | Discharge: 2023-10-19 | Disposition: A | Discharge status: Home / Self Care | Attending: Internal Medicine | Admitting: Internal Medicine

## 2023-10-19 ENCOUNTER — Ambulatory Visit (INDEPENDENT_AMBULATORY_CARE_PROVIDER_SITE_OTHER): Admitting: Radiology

## 2023-10-19 DIAGNOSIS — N644 Mastodynia: Secondary | ICD-10-CM

## 2023-10-19 NOTE — ED Provider Notes (Signed)
 [facility]    CSN: [id number] Arrival date & time: 10/19/23  9175      History   Chief Complaint Chief Complaint  Patient presents with   chest muscle pain    HPI Brandon Mendoza. is a 83 y.o. male.   Brandon Mendoza. is a 83 y.o. male presenting for chief complaint of mass to the left breast that first became noticeable to him 7 to 10 days ago.  He states he started feeling tenderness to the left breast tissue after his left arm bumped into the breast tissue. He palpated the area and found a small lump to the left breast. The lump is tender with palpation most of the time, states the lump does not hurt when he is not pressing on it. He has had a loop recorder to the left upper chest since 2022. No recent fever, chills, night sweats, skin dimpling to the left breast, nipple inversion, nipple discharge, weight loss without trying, or family history of breast malignancy in male or male. He denies left upper chest pain, shortness of breath, cough, congestion,  and heart palpitations. Denies left axillary pain and recent falls. History of A-fib, takes aspirin . He has not attempted use of any OTC medications to help with symptoms PTA.      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   The spinal cord appears normal.  2.   No spinal stenosis. 3.   Mild multilevel degenerative changes as detailed above that do not lead to spinal stenosis or nerve root compression. 4.   T2 hyperintense foci within the pons consistent with chronic microvascular ischemic changes.   Loop - Medtronic Linq 10/22/2020 10/22/2020   Nocturia    Paroxysmal atrial fibrillation (HCC)    Peripheral neuropathy    Peripheral vascular disease (HCC)    Sleep apnea    on 6 cm, nasal pillow   Spinal stenosis    Stroke (HCC) 05/01/2011   ischemic   Weakness generalized 10/21/2021   Severe, legs and arms    Patient Active Problem List   Diagnosis Date Noted   Coronary artery calcification 05/09/2023   Rupture of quadriceps tendon 11/03/2022   Wound dehiscence 10/07/2022   Quadriceps tendon rupture, right, subsequent encounter 08/30/2022   Quadriceps tendon rupture, right, initial encounter 07/12/2022   Unable to care for self 07/08/2022   Dehydration 07/08/2022   Depression 07/08/2022   COPD (chronic obstructive pulmonary disease) (HCC) 07/08/2022   Sialoadenitis of submandibular gland 04/28/2022   Macular degeneration 03/01/2022   Myofascial pain 02/10/2022   Lumbar spondylosis 01/03/2022   Acquired clawfoot, left foot 12/16/2021   Acquired clawfoot, right foot 12/16/2021   Claw foot  12/14/2021   Gait abnormality 12/10/2021   Spinal stenosis at L4-L5 level 12/10/2021   Ataxia 11/22/2021   Lightheadedness 11/02/2021   Claudication of lower extremity (HCC) 10/21/2021   Atrophy of calf muscles 10/21/2021   Weakness generalized 10/21/2021   Focal neurological deficit 10/21/2021   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   Pain in joint of right hip 09/14/2021   High arches 07/22/2021   Hammertoes of both feet 07/22/2021   Neuropathy 07/22/2021   Constipation 05/28/2021   Disequilibrium 05/28/2021   History of colonic polyps 05/28/2021   Renal cyst 05/28/2021   Gastroesophageal reflux disease  without esophagitis 05/05/2021   Ingrowing left great toenail 02/15/2021   S/P lumbar fusion 12/24/2020   Spinal stenosis of lumbar region 12/04/2020   Acquired complex renal cyst 11/23/2020   Functional gait abnormality 11/04/2020   Idiopathic neuropathy 11/02/2020   Tinea pedis of both feet 10/28/2020   Loop - Medtronic Linq 10/22/2020 10/22/2020   Benign hypertension with 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) 10/05/2020   Fall with injury 10/05/2020   Hematoma 09/16/2020   Normocytic anemia 09/14/2020   Leukocytosis 09/14/2020   AF (paroxysmal atrial fibrillation) (HCC) 09/14/2020   Chronic anticoagulation 09/14/2020   Hyponatremia 09/14/2020   Pain in left foot 01/22/2020   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   Pain in joint of right knee 12/27/2019   Recurrent falls 11/17/2019   Paroxysmal atrial fibrillation (HCC) 10/22/2019   Pain in right foot 10/14/2019   Falls 10/07/2019   Ataxia involving legs 08/12/2019   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   Urge incontinence 04/13/2018   Urinary urgency 04/13/2018   Pain in left knee 03/01/2018   Other intervertebral disc degeneration, lumbar region 06/16/2017   Acquired bilateral hammer toes 10/05/2015   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   Generalized anxiety disorder 05/12/2015   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   History of left-sided carotid endarterectomy 04/10/2014   Essential hypertension 04/10/2014   Occlusion and stenosis of carotid artery without mention of cerebral infarction 12/15/2011   Habitual alcohol use 12/01/2011   Carotid artery stenosis, symptomatic 11/30/2011   Cerebral infarction (HCC) 11/29/2011   Mixed hyperlipidemia     Past Surgical History:  Procedure Laterality Date   CAROTID ENDARTERECTOMY  12/09/11   Left cea   ENDARTERECTOMY  12/09/2011   Procedure: ENDARTERECTOMY  CAROTID;  Surgeon: Krystal JULIANNA Doing, MD;  Location: [facility] OR;  Service: Vascular;  Laterality: Left;   EYE SURGERY     rt retina detachment   HAMMER TOE SURGERY  2004   HERNIA REPAIR  2006   I & D EXTREMITY Right 10/07/2022   Procedure: IRRIGATION AND DEBRIDEMENT EXTREMITY WITH WOUND CLOSURE;  Surgeon: Burnetta Aures, MD;  Location: [facility]S;  Service: Orthopedics;  Laterality: Right;   QUADRICEPS TENDON REPAIR Right 07/12/2022   Procedure: REPAIR QUADRICEP TENDON;  Surgeon: Ernie Cough, MD;  Location: [facility];  Service: Orthopedics;  Laterality: Right;   QUADRICEPS TENDON REPAIR Right 08/30/2022   Procedure: REPAIR QUADRICEP TENDON;  Surgeon: Ernie Cough, MD;  Location: [facility];  Service: Orthopedics;  Laterality: Right;   SHOULDER ARTHROSCOPY W/ ROTATOR CUFF REPAIR  2010   TONSILLECTOMY         Home Medications    Prior to Admission medications   Medication Sig Start Date End Date Taking? Authorizing Provider  aspirin  EC 81 MG  tablet Take 81 mg by mouth daily. Swallow whole. Four times a week (Sunday, Monday, Wednesday and Friday).    [provider]  DULoxetine  (CYMBALTA ) 30 MG capsule Take 1 capsule (30 mg total) by mouth at bedtime. 09/14/22   Jesus Bernardino MATSU, MD  flecainide  (TAMBOCOR ) 100 MG tablet Take 1 tablet (100 mg total) by mouth 2 (two) times daily. Patient taking differently: Take 50 mg by mouth 2 (two) times daily. Takes half of a 100 mg tablet. 03/03/23   Ladona Heinz, MD  flecainide  (TAMBOCOR ) 100 MG tablet Take 100 mg by mouth once. 04/12/22   [provider]  losartan -hydrochlorothiazide  (HYZAAR) 50-12.5 MG tablet TAKE 1 TABLET EVERY MORNING 10/27/22   Ladona Heinz, MD  melatonin 5 MG TABS 1 tablet in the evening Orally Once a day    [provider]  rosuvastatin  (CRESTOR ) 10 MG tablet Take 1 tablet (10 mg total) by mouth every evening. 02/27/23   Jesus Bernardino MATSU, MD  sertraline  (ZOLOFT ) 50 MG tablet TAKE AS INSTRUCTED BY YOUR PRESCRIBER 05/04/23   Jesus Bernardino MATSU, MD    Family History Family History  Problem Relation Age of Onset   Alzheimer's disease Father    Diabetes Other     Social History Social History   Tobacco Use   Smoking status: Former    Current packs/day: 0.00    Average packs/day: 1 pack/day for 50.0 years (50.0 ttl pk-yrs)    Types: Cigarettes    Start date: 08/27/1954    Quit date: 08/26/2004    Years since quitting: 19.1    Passive exposure: Never   Smokeless tobacco: Never   Tobacco comments:    quit smoking 08/2004  Vaping Use   Vaping status: Never Used  Substance Use Topics   Alcohol use: Yes    Alcohol/week: 7.0 standard drinks of alcohol    Types: 7 Standard drinks or equivalent per week    Comment: occ   Drug use: No     Allergies   Patient has no known allergies.   Review of Systems Review of Systems Per HPI  Physical Exam Triage Vital Signs ED Triage Vitals  Encounter Vitals Group     BP 10/19/23 0831 138/72     Girls Systolic BP Percentile --      Girls Diastolic BP Percentile --      Boys Systolic BP Percentile --      Boys Diastolic BP Percentile --      Pulse Rate 10/19/23 0831 64     Resp 10/19/23 0831 18     Temp 10/19/23 0831 (!) 97.4 F (36.3 C)     Temp Source 10/19/23 0831 Oral     SpO2 10/19/23 0831 95 %     Weight --      Height --      Head Circumference --      Peak Flow --      Pain Score 10/19/23 0844 3     Pain Loc --      Pain Education --      Exclude from Growth Chart --    No data found.  Updated Vital Signs BP 138/72 (BP Location: Left Arm)   Pulse 64   Temp (!) 97.4 F (36.3 C) (Oral)   Resp 18   SpO2 95%   Visual Acuity Right Eye Distance:   Left Eye Distance:   Bilateral Distance:    Right Eye Near:   Left Eye Near:  Bilateral Near:     Physical Exam Vitals and nursing note reviewed.  Constitutional:      Appearance: He is not ill-appearing or toxic-appearing.  HENT:     Head: Normocephalic and atraumatic.     Right Ear: Hearing  and external ear normal.     Left Ear: Hearing and external ear normal.     Nose: Nose normal.     Mouth/Throat:     Lips: Pink.  Eyes:     General: Lids are normal. Vision grossly intact. Gaze aligned appropriately.     Extraocular Movements: Extraocular movements intact.     Conjunctiva/sclera: Conjunctivae normal.  Cardiovascular:     Rate and Rhythm: Normal rate. Rhythm irregular.     Heart sounds: Normal heart sounds, S1 normal and S2 normal.     Comments: Atrial fibrillation (baseline). Pulmonary:     Effort: Pulmonary effort is normal. No respiratory distress.     Breath sounds: Normal breath sounds and air entry.  Chest:     Chest wall: No mass, deformity, swelling or crepitus.  Breasts:    Breasts are symmetrical.     Right: Normal. No swelling, bleeding, inverted nipple, mass, nipple discharge, skin change or tenderness.     Left: Tenderness (Point tender at the 2 o'clock position of the left breast as indicated in graphic below.) present. No swelling, bleeding, inverted nipple, mass, nipple discharge or skin change.       Comments: Normal breast tissue to palpation symmetrically.  I am unable to palpate a localized mass. Musculoskeletal:     Cervical back: Neck supple.  Lymphadenopathy:     Upper Body:     Right upper body: No axillary or pectoral adenopathy.     Left upper body: No axillary or pectoral adenopathy.  Skin:    General: Skin is warm and dry.     Capillary Refill: Capillary refill takes less than 2 seconds.     Findings: No rash.  Neurological:     General: No focal deficit present.     Mental Status: He is alert and oriented to person, place, and time. Mental status is at baseline.     Cranial Nerves: No dysarthria or facial asymmetry.  Psychiatric:        Mood and Affect: Mood normal.        Speech: Speech normal.        Behavior: Behavior normal.        Thought Content: Thought content normal.        Judgment: Judgment normal.      UC  Treatments / Results  Labs (all labs ordered are listed, but only abnormal results are displayed) Labs Reviewed - No data to display  EKG   Radiology DG Chest 2 View Result Date: 10/19/2023 CLINICAL DATA:  LEFT breast mass EXAM: CHEST - 2 VIEW COMPARISON:  CT 05/08/2023 FINDINGS: Normal cardiac silhouette. Cardiac recording device noted. Small nodule in the RIGHT upper lobe is unchanged from radiograph 04/18/2023. No nodule identified on CT 05/08/2023. Potential skin lesion. No pulmonary edema. No pleural fluid or pneumothorax. Consolidation IMPRESSION: No acute cardiopulmonary process. Electronically Signed   By: Jackquline Boxer M.D.   On: 10/19/2023 09:36    Procedures Procedures (including critical care time)  Medications Ordered in UC Medications - No data to display  Initial Impression / Assessment and Plan / UC Course  I have reviewed the triage vital signs and the nursing notes.  Pertinent labs & imaging results that  were available during my care of the patient were reviewed by me and considered in my medical decision making (see chart for details).   1. Left breast pain, left breast mass Unclear cause of left breat pain. On exam, the breast tissue of concern at the 2 o'clock position of the left breast feels dense and yet symmetric with comparison palpation of the right breast tissue.  No red flags on exam today. Low suspicion for cardiac etiology of pain though this could be muscular in nature. Pain is not elicited with movement of the chest wall/left upper extremity.   EKG shows atrial fibrillation (baseline) without ST/T wave changes, no signs of rapid ventricular response.   Chest x-ray shows no acute findings, loop recorder in place.   Outpatient ultrasound of the left breast tissue and unilateral left mammogram have been ordered for further investigation of potential mass to the left breast.  Breast center at [facility] will call to have this scheduled.  Red  flag signs/symptoms of worsening breast problem discussed with patient. Follow-up with PCP PRN.   Counseled patient on potential for adverse effects with medications prescribed/recommended today, strict ER and return-to-clinic precautions discussed, patient verbalized understanding.    Final Clinical Impressions(s) / UC Diagnoses   Final diagnoses:  Breast pain, left     Discharge Instructions      Your chest x-ray looks good, your EKG looks great. It's unclear what is causing your left breast pain.  This could be a cyst, however I'd like for you to have an ultrasound of the left breast and potentially a mammogram to rule out underlying problem.  The breast center at [facility] imaging will be giving you a phone call to schedule the appointment for your ultrasound/mammogram of the left breast.  If you notice skin dimpling, skin color changes, nipple discharge, fever/chills, weight loss without trying, night sweats, etc. in the meantime, please go to the nearest emergency room for further workup and evaluation of this.  Otherwise, perform warm compresses to the left breast as needed for pain and take Tylenol  1000 mg every 6 hours as needed for pain.  Please schedule a follow-up appointment with your primary care provider as needed to discuss symptoms further.      ED Prescriptions   None    PDMP not reviewed this encounter.   Enedelia Dorna HERO, [place] 10/19/23 1257

## 2023-10-19 NOTE — ED Triage Notes (Signed)
 Pt c/o muscle pain and lump in left breast tissue for about 1 week. States it is tender to touch.

## 2023-10-19 NOTE — Discharge Instructions (Addendum)
 Your chest x-ray looks good, your EKG looks great. It's unclear what is causing your left breast pain.  This could be a cyst, however I'd like for you to have an ultrasound of the left breast and potentially a mammogram to rule out underlying problem.  The breast center at [facility] imaging will be giving you a phone call to schedule the appointment for your ultrasound/mammogram of the left breast.  If you notice skin dimpling, skin color changes, nipple discharge, fever/chills, weight loss without trying, night sweats, etc. in the meantime, please go to the nearest emergency room for further workup and evaluation of this.  Otherwise, perform warm compresses to the left breast as needed for pain and take Tylenol  1000 mg every 6 hours as needed for pain.  Please schedule a follow-up appointment with your primary care provider as needed to discuss symptoms further.

## 2023-10-24 ENCOUNTER — Encounter: Payer: Self-pay | Admitting: Family Medicine

## 2023-10-24 ENCOUNTER — Encounter: Payer: Self-pay | Admitting: Internal Medicine

## 2023-10-24 ENCOUNTER — Encounter: Payer: Self-pay | Admitting: Physical Therapy

## 2023-10-24 ENCOUNTER — Ambulatory Visit: Admitting: Physical Therapy

## 2023-10-24 DIAGNOSIS — M6281 Muscle weakness (generalized): Secondary | ICD-10-CM

## 2023-10-24 DIAGNOSIS — R296 Repeated falls: Secondary | ICD-10-CM

## 2023-10-24 DIAGNOSIS — R2689 Other abnormalities of gait and mobility: Secondary | ICD-10-CM | POA: Diagnosis not present

## 2023-10-24 DIAGNOSIS — M5459 Other low back pain: Secondary | ICD-10-CM

## 2023-10-24 DIAGNOSIS — N183 Chronic kidney disease, stage 3 unspecified: Secondary | ICD-10-CM

## 2023-10-24 DIAGNOSIS — M6249 Contracture of muscle, multiple sites: Secondary | ICD-10-CM

## 2023-10-24 DIAGNOSIS — M25561 Pain in right knee: Secondary | ICD-10-CM

## 2023-10-24 MED ORDER — LOSARTAN POTASSIUM-HCTZ 50-12.5 MG PO TABS: 1.0000 | ORAL_TABLET | Freq: Every morning | ORAL | 4 refills | Status: AC

## 2023-10-24 NOTE — Telephone Encounter (Signed)
 Is it okay to change to VV since pt is still in wheelchair? It's pt's Initial CPAP Appointment

## 2023-10-24 NOTE — Therapy (Signed)
 OUTPATIENT PHYSICAL THERAPY TREATMENT   Progress Note Reporting Period 08/24/23 to 10/24/23  visits 31-40  See note below for Objective Data and Assessment of Progress/Goals.      Patient Name: Brandon Mendoza. MRN: [id number] DOB:04/02/1940, 83 y.o., male Today's Date: 10/24/2023  END OF SESSION:  PT End of Session - 10/24/23 0846     Visit Number 40    Date for PT Re-Evaluation 11/10/23    Authorization Type Medicare    PT Start Time 0843    PT Stop Time 0930    PT Time Calculation (min) 47 min    Activity Tolerance Patient tolerated treatment well    Behavior During Therapy [facility] for tasks assessed/performed;Impulsive          Past Medical History:  Diagnosis Date   Acquired clawfoot, right foot 12/16/2021   Anxiety    Atrophy of calf muscles 10/21/2021   Duplex calves was negative for significant blockage EMG was done at [facility] MRI lumbar spine was also done at [facility]   Carotid artery occlusion    CKD (chronic kidney disease)    Stage 3   COPD (chronic obstructive pulmonary disease) (HCC)    Depression    Dysrhythmia    PAF, atrial tachycardia   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   The spinal cord appears normal. 2.   No spinal stenosis. 3.   Mild multilevel degenerative changes as detailed above that do not lead to spinal stenosis or nerve root compression. 4.   T2 hyperintense foci within the pons consistent with chronic microvascular ischemic changes.   Loop - Medtronic Linq 10/22/2020 10/22/2020   Nocturia    Paroxysmal atrial fibrillation (HCC)    Peripheral neuropathy    Peripheral vascular disease (HCC)    Sleep apnea    on 6 cm, nasal pillow   Spinal stenosis    Stroke (HCC) 05/01/2011   ischemic   Weakness  generalized 10/21/2021   Severe, legs and arms   Past Surgical History:  Procedure Laterality Date   CAROTID ENDARTERECTOMY  12/09/11   Left cea   ENDARTERECTOMY  12/09/2011   Procedure: ENDARTERECTOMY CAROTID;  Surgeon: Krystal JULIANNA Doing, MD;  Location: [facility] OR;  Service: Vascular;  Laterality: Left;   EYE SURGERY     rt retina detachment   HAMMER TOE SURGERY  2004   HERNIA REPAIR  2006   I & D EXTREMITY Right 10/07/2022   Procedure: IRRIGATION AND DEBRIDEMENT EXTREMITY WITH WOUND CLOSURE;  Surgeon: Burnetta Aures, MD;  Location: [facility] ORS;  Service: Orthopedics;  Laterality: Right;   QUADRICEPS TENDON REPAIR Right 07/12/2022   Procedure: REPAIR QUADRICEP TENDON;  Surgeon: Ernie Cough, MD;  Location: [facility] OR[facility];  Service: Orthopedics;  Laterality: Right;   QUADRICEPS TENDON REPAIR Right 08/30/2022   Procedure: REPAIR QUADRICEP TENDON;  Surgeon: Ernie Cough, MD;  Location: [facility] OR[facility];  Service: Orthopedics;  Laterality: Right;   SHOULDER ARTHROSCOPY W/ ROTATOR CUFF REPAIR  2010   TONSILLECTOMY     Patient Active Problem List   Diagnosis Date Noted   Coronary artery calcification 05/09/2023   Rupture of quadriceps tendon 11/03/2022   Wound dehiscence 10/07/2022   Quadriceps tendon rupture, right, subsequent  encounter 08/30/2022   Quadriceps tendon rupture, right, initial encounter 07/12/2022   Unable to care for self 07/08/2022   Dehydration 07/08/2022   Depression 07/08/2022   COPD (chronic obstructive pulmonary disease) (HCC) 07/08/2022   Sialoadenitis of submandibular gland 04/28/2022   Macular degeneration 03/01/2022   Myofascial pain 02/10/2022   Lumbar spondylosis 01/03/2022   Acquired clawfoot, left foot 12/16/2021   Acquired clawfoot, right foot 12/16/2021   Claw foot 12/14/2021   Gait abnormality 12/10/2021   Spinal stenosis at L4-L5 level 12/10/2021   Ataxia 11/22/2021   Lightheadedness 11/02/2021   Claudication of lower extremity (HCC) 10/21/2021   Atrophy of calf muscles  10/21/2021   Weakness generalized 10/21/2021   Focal neurological deficit 10/21/2021   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   Pain in joint of right hip 09/14/2021   High arches 07/22/2021   Hammertoes of both feet 07/22/2021   Neuropathy 07/22/2021   Constipation 05/28/2021   Disequilibrium 05/28/2021   History of colonic polyps 05/28/2021   Renal cyst 05/28/2021   Gastroesophageal reflux disease without esophagitis 05/05/2021   Ingrowing left great toenail 02/15/2021   S/P lumbar fusion 12/24/2020   Spinal stenosis of lumbar region 12/04/2020   Acquired complex renal cyst 11/23/2020   Functional gait abnormality 11/04/2020   Idiopathic neuropathy 11/02/2020   Tinea pedis of both feet 10/28/2020   Loop - Medtronic Linq 10/22/2020 10/22/2020   Benign hypertension with 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) 10/05/2020   Fall with injury 10/05/2020   Hematoma 09/16/2020   Normocytic anemia 09/14/2020   Leukocytosis 09/14/2020   AF (paroxysmal atrial fibrillation) (HCC) 09/14/2020   Chronic anticoagulation 09/14/2020   Hyponatremia 09/14/2020   Pain in left foot 01/22/2020   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   Pain in joint of right knee 12/27/2019   Recurrent falls 11/17/2019   Paroxysmal atrial fibrillation (HCC) 10/22/2019   Pain in right foot 10/14/2019   Falls 10/07/2019   Ataxia involving legs 08/12/2019   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   Urge incontinence 04/13/2018   Urinary urgency 04/13/2018   Pain in left knee 03/01/2018   Other intervertebral disc degeneration, lumbar region 06/16/2017   Acquired bilateral hammer toes 10/05/2015   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   Generalized anxiety disorder 05/12/2015   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   History of left-sided carotid endarterectomy  04/10/2014   Essential hypertension 04/10/2014   Occlusion and stenosis of carotid artery without mention of cerebral infarction 12/15/2011   Habitual alcohol use 12/01/2011   Carotid artery stenosis, symptomatic 11/30/2011   Cerebral infarction (HCC) 11/29/2011   Mixed hyperlipidemia     PCP: Bernardino Cone  REFERRING PROVIDER: Bernardino Cone  REFERRING DIAG: weakness, multiple falls  Rationale for Evaluation and Treatment: Rehabilitation  THERAPY DIAG:  Other abnormalities of gait and mobility  Repeated falls  Contracture of muscle, multiple sites  Muscle weakness (generalized)  Acute pain of right knee  Other low back pain  ONSET DATE: 10/07/22  SUBJECTIVE:  SUBJECTIVE STATEMENT: Patient went to [facility] for left breast pain, has had some testing but will get more, denies issues with exercises   Patient asking about plan, he reports that he is worried that he will not be able to stay independent and live at home if he stops, he has no equipment and with the right knee's inability to extend and the left knee having issues with buckling due to neuropathy, he is worried about more falls and the strain on his wife  Doing all right, had one fall, fell in the shower yesterday. Did exercises before the showers, legs gave out  on him  PERTINENT HISTORY:  Cerebral infarct 10/21/21, lumbar fusion, recurrent falls, has significant neuropathy and this is the reason for most of his falls   PAIN:  Are you having pain? Yes: NPRS scale: 0/10 Pain location: right knee Pain description: ache  Aggravating factors: being on my feet  Relieving factors: movement  PRECAUTIONS: Fall  WEIGHT BEARING RESTRICTIONS: No  FALLS:  Has patient fallen in last 6 months? Yes. Number of falls every few days   LIVING  ENVIRONMENT: Lives with: lives with their spouse Lives in: House/apartment Stairs: Yes: Internal: 20 steps; on right going up Has following equipment at home: [name] - 4 wheeled, Wheelchair (manual), shower chair, Tour manager, Grab bars, Ramped entry, and trekking pole  OCCUPATION: Retired  PLOF: Independent, prior to a year ago he was driving used trekking poles to walk  PATIENT GOALS: live at home  NEXT MD VISIT:   OBJECTIVE:   DIAGNOSTIC FINDINGS:    LUMBAR SPINE FINDINGS: 1. Wide laminectomy and posterior pedicle screw and rod fixation at L4-5 without residual or recurrent stenosis. 2. Progressive adjacent level disease at L3-4 with moderate right and mild left subarticular narrowing and moderate foraminal stenosis bilaterally. Progression is predominantly due to facet disease 3. Progressive moderate facet hypertrophy at L1-2 with a progressive rightward disc protrusion resulting in progressive moderate right foraminal stenosis. Asymmetric endplate marrow edema on the right at L1-2 is consistent with progressive disease as well. 4. Otherwise stable degenerative change without other focal stenosis.   THORACIC SPINE FINDINGS:  On sagittal views the vertebral bodies have normal height and alignment.  Hemangioma within T9 vertebral body and slightly within T10 vertebral body.  Anterior kyphosis.  Mild scoliosis convex right centered at T8 level. The spinal cord is normal in size and appearance. The paraspinal soft tissues are unremarkable.     On axial views there is no spinal stenosis or foraminal narrowing.  Limited views of the aorta, kidneys, liver, lungs and paraspinal muscles are notable for multiple large right renal cysts ranging in size from 1.5 to 7 cm.   COGNITION: Overall cognitive status: Within functional limits for tasks assessed     SENSATION: WFL  MUSCLE LENGTH: Hamstrings: mod tightness on both sides  POSTURE: rounded shoulders and forward  head  LOWER EXTREMITY ROM:   the right knee has HS, has no quad tendon on the right, unable to lift the leg, left leg WFL's , left hip and right hip and ankles WFL's   LOWER EXTREMITY MMT: 4-/5 for the LE's except for the right knee extension UPPER EXTREMITY MMT:  4-/5 for shoulders and elbows   FUNCTIONAL TESTS:  5XSTS: 83 seconds really has to use his hands and is unsteady with standing Cannot stand on his own without holding onto something due to poor balance   Transfers:  needs set up and MinA for safety, is  impulsive and unsafe at times. GAIT: He is w/c dependent    TODAY'S TREATMENT:                                                                                                                              DATE:  10/24/23 15# rows  15# straight arm pulls 15# biceps 20# overhead tricep press 45# leg curls  then right only 25# 10# leg extension left On mat partial sit ups with 6# held at chest Elbow touches to mat each side Seated reach to cone and then extend back up PWR move Feet on ball K2C, rotation, bridge isometric abs Black tband clamshells Ball squeeze with bridge Passive stretch right knee extension Left side lying right hip abduction SLR on the right  10/10/23 15# biceps 3x15 15# chest press 3x15 15# straight arms  15# rows 20# triceps press overhead Discussion about his HEP with a 5# weight at home, trying to have him not do much overhead stuff 45# leg curls , right only 25# 2x15 each 5# standing marches with PT max A to keep leg bearing weight straight Patient wanted to weight so with a lot of effort got him on scale weight was 178# Passive stretch of the left knee to prevent contracture Feet on ball bridges Partial sit ups   10/06/23 Rows 15lb x15, x20 Shoulder Ext 15lb 2x20 Curls 10lb x20, x15 Chest press 15lb 2x20 Leg press 50lb x 15, 40lb 2x15 At Mat Seated Crunches 2x15 Seated Trunk rotations with weighted ball Seated OHP blue ball  2x15 Horiz abd blue 2x10 Hip and black 2x15  09/26/23 At Mat Seated Crunches 2x15 Seated Trunk rotations with weighted ball Fitter press all bands w/ LLE 2x15  1black 1 blue band RLE 2x15 Military press 3lb 2x15  HS curls black 2x15   Rows black band 2x10  Horizontal abduction blue   Ankle R blue 2x15 15lb shoulder Ext 2x15 Biceps curls 10lb 2x10 Triceps Ext 20lb 2x15   09/21/23 Nustep level 6 x 7 minutes with arms and then 4 minutes without arms Rows 15lb 2x15 Extension 15lb 2x15 15# biceps 20# triceps Chest press 15lb HS curls 55lb 2x15, RLE 20b 2x15 Leg Ext LLE 15lb 2x15 Fitter press RLE 1 blue 1black, LLE all bands   09/12/23 Nustep level 6 x 7 minutes with arms and then 4 minutes without arms At mat able  Seated sit ups Front raises blue ball  Horiz abd black Fitter press all bands w/ LLE 2x15  1black 1 blue band RLE 2x15 Trunk rotations with blue ball  Rows black band 2x10 OHP 3lb dumbell   09/08/23 Nustep level 6 x 7 minutes with arms and then 4 minutes without arms Rows 15lb 2x15 Extension 15lb 2x15 15# biceps 20# triceps Chest press 15lb At mat able  Seated sit ups Trunk rotations with blue ball  HS curls blue x 15 08/31/23 Nustep level 6 x 7 minutes with arms  and then 4 minutes without arms 15# rows Lats 20lb 15# chest press 15# biceps 20# triceps 55# leg curls , left only 20# Leg Ext 10lb LLE 2x15 LLE leg Ext 10lb 2x15 Seated hip abd black band   08/29/23 Nustep level 6 x 8 minutes with arms and then 4 minutes without arms 15# rows 15# lats 15# chest press 15# biceps 20# triceps 55# leg curls , left only 25# Supine feet on ball, bridge, isometric abs Black tband clamshells Education on safety  08/24/23 Nustep level 6 x 7 minutes 15# Rows 15# straight arm  15# chest press 15# biceps 20# triceps Passive stretch right knee into extension Feet on ball K2C, rotation, bridges, isometric abs Tband clamshells Ball b/n knees with  bridge Black tband hip extension  08/22/23 Nustep level 6 x 6 minutes, then level 5 LE only x 5 minutes LEg curls 45# 2x15 left only 25# 2x10 LEft leg ext 10# 2x15 15# row, extension 10# chest press 15# biceps 20# triceps  08/18/23 Nustep level 6 x 6 minutes, LE only level 5 x 3 minutes Leg press 60lb 2x15 Chest press 10lb 2x15 Biceps Curls 10lb 2x10 Rows & Ext 15lb 2x15 At MAT  OHP blue ball 2x15  Seated trunk rotations blue ball   HS curls green 2x15 each 08/15/23 Nustep level 6 x 10 minutes In Pbars standing trying to get him to extend the right hip and have knee locked to bear weight on the right without the knee buckling LEg press 60# 2x15, then left only 40# Supine feet on ball K2C, rotation, bridges, isometric abs Passive stretch right knee Black tband clamshells Black tband right hip extension Passive stretch right knee into extension Bridges  PATIENT EDUCATION:  Education details: POC Person educated: Patient Education method: Explanation Education comprehension: verbalized understanding  HOME EXERCISE PROGRAM:  ASSESSMENT:  CLINICAL IMPRESSION: Patient is progressing some with his home exercises was able to acquire a Nustep type machine and is using it 5 x / week.  We have set up the maintenance program following medicare guidelines.  He qualifies for this due to the inability to be safe, has had multiple falls and is impulsive, tends to think he can do more than he can.  Patient has not had a fall in the past 5 weeks, he remains very unsafe at times and with any transfer needs min A or close CGA.  Tends to not understand that the right knee has no quad to hold the leg straight.  I  In the long run he is impulsive and unsafe and cannot exercise on his own to maintain his level, he is at a high risk for falls and there is no gym that has the ability to assist him. Session completed at mat table to eliminate trunk support for improved core stabilization. Core  weakness with seated OHP.  Pt did report a fall yesterday stated he was standing pulling his pants up, I advised pt that he shouldn't put weight in his RLE, He ws adamant that he could put a little on it.     Justifying Skilled Maintenance for Rehab Services:   The goal of a skilled maintenance therapy program is to maintain the patient's current functional status or to prevent or slow deterioration. If a patient who is receiving restorative therapy then requires skilled maintenance therapy based on the skills and judgment of the therapist, development of a maintenance program would occur during the last visit for restorative treatment.  Does the patient need treatment that necessitates skilled services that can only be provided by a skilled provider? Can a caregiver provide these services why or why not? 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  He is impulsive and at times very unsafe he could not do this on his own and his wife could not help him., history of COPD, CKD, depression, HTN, kyphosis, a-fib, PVD, spinal stenosis, and stroke as well as RC repair.  These limit the patient's independent carryover with PT  exercises and require max cueing for optimal performance of safety and to strengthen other mms for his future in therapy sessions.    The patient requires the skill of PT to help with ongoing exercise and mobility to prevent rapid and significant decline in functional mobility, strength, and to limit falls with his safety.  Therefore, continued skilled maintenance therapy is recommended for this patient.    OBJECTIVE IMPAIRMENTS: Abnormal gait, cardiopulmonary status limiting activity, decreased activity tolerance, decreased balance, decreased coordination, decreased endurance, decreased mobility,  difficulty walking, decreased ROM, decreased strength, improper body mechanics, postural dysfunction, and poor safety.   REHAB POTENTIAL: Fair dependent on carry over and patient compliance  CLINICAL DECISION MAKING: Evolving/moderate complexity  EVALUATION COMPLEXITY: Moderate  GOALS: Goals for skilled maintenance:     Long Term Goals: Target Date 12/13/23     The patient will preserve functional mobility within the home environment. Baseline: uses a w/c            Goal Status: Progressing 10/06/23   2. The patient will maintain strength for transfers.            Baseline: able to perform with set up and at times requires mod A and at other time may need Max A due to poor safety and impulsiveness            Goal Status: progressing 10/24/23   3. The patient will minimize fall frequency.            Baseline: he has been having about a fall a month            Goal Status: ongoing 10/10/23   4. Safe with HEP at home and with equipment he has            Baseline: does HEP             Goal Status: progressing 10/24/23   PLAN:  PT FREQUENCY: 2x/week  PT DURATION: 12 weeks  PLANNED INTERVENTIONS: Therapeutic exercises, Therapeutic activity, Neuromuscular re-education, Balance training, Gait training, Patient/Family education, Self Care, Joint mobilization, Stair training, Cryotherapy, Moist heat, and Manual therapy.  PLAN FOR NEXT SESSION: Have sat up the maintenance program for Mr. Streeper and progressing with this, biggest issue he is unsafe and cannot do on his own or with his wife  OBADIAH OZELL ORN, PT

## 2023-10-25 NOTE — Telephone Encounter (Signed)
 Pt's appointment changed to MyChart Visit

## 2023-10-31 ENCOUNTER — Encounter: Payer: Self-pay | Admitting: Physical Therapy

## 2023-10-31 ENCOUNTER — Ambulatory Visit: Attending: Internal Medicine | Admitting: Physical Therapy

## 2023-10-31 DIAGNOSIS — R2689 Other abnormalities of gait and mobility: Secondary | ICD-10-CM | POA: Insufficient documentation

## 2023-10-31 DIAGNOSIS — M6281 Muscle weakness (generalized): Secondary | ICD-10-CM | POA: Diagnosis present

## 2023-10-31 DIAGNOSIS — M6249 Contracture of muscle, multiple sites: Secondary | ICD-10-CM | POA: Insufficient documentation

## 2023-10-31 DIAGNOSIS — M5459 Other low back pain: Secondary | ICD-10-CM | POA: Diagnosis present

## 2023-10-31 DIAGNOSIS — R296 Repeated falls: Secondary | ICD-10-CM | POA: Diagnosis present

## 2023-10-31 DIAGNOSIS — M25561 Pain in right knee: Secondary | ICD-10-CM | POA: Insufficient documentation

## 2023-10-31 NOTE — Progress Notes (Signed)
 [facility] Summary Report / Loop Recorder

## 2023-10-31 NOTE — Therapy (Signed)
 OUTPATIENT PHYSICAL THERAPY TREATMENT    Patient Name: Brandon Mendoza. MRN: [id number] DOB:[birth year], 83 y.o., male Today's Date: 10/31/2023  END OF SESSION:  PT End of Session - 10/31/23 0848     Visit Number 41    Date for PT Re-Evaluation 11/10/23    Authorization Type Medicare    PT Start Time 0845    PT Stop Time 0930    PT Time Calculation (min) 45 min    Activity Tolerance Patient tolerated treatment well    Behavior During Therapy [facility] for tasks assessed/performed;Impulsive          Past Medical History:  Diagnosis Date   Acquired clawfoot, right foot 12/16/2021   Anxiety    Atrophy of calf muscles 10/21/2021   Duplex calves was negative for significant blockage EMG was done at [facility] MRI lumbar spine was also done at [facility]   Carotid artery occlusion    CKD (chronic kidney disease)    Stage 3   COPD (chronic obstructive pulmonary disease) (HCC)    Depression    Dysrhythmia    PAF, atrial tachycardia   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   The spinal cord appears normal. 2.   No spinal stenosis. 3.   Mild multilevel degenerative changes as detailed above that do not lead to spinal stenosis or nerve root compression. 4.   T2 hyperintense foci within the pons consistent with chronic microvascular ischemic changes.   Loop - Medtronic Linq 10/22/2020 10/22/2020   Nocturia    Paroxysmal atrial fibrillation (HCC)    Peripheral neuropathy    Peripheral vascular disease (HCC)    Sleep apnea    on 6 cm, nasal pillow   Spinal stenosis    Stroke (HCC) 05/01/2011   ischemic   Weakness generalized 10/21/2021   Severe, legs and arms   Past Surgical History:  Procedure Laterality Date   CAROTID ENDARTERECTOMY  12/09/11   Left cea    ENDARTERECTOMY  12/09/2011   Procedure: ENDARTERECTOMY CAROTID;  Surgeon: Krystal JULIANNA Doing, MD;  Location: [facility] OR;  Service: Vascular;  Laterality: Left;   EYE SURGERY     rt retina detachment   HAMMER TOE SURGERY  2004   HERNIA REPAIR  2006   I & D EXTREMITY Right 10/07/2022   Procedure: IRRIGATION AND DEBRIDEMENT EXTREMITY WITH WOUND CLOSURE;  Surgeon: Burnetta Aures, MD;  Location: [facility];  Service: Orthopedics;  Laterality: Right;   QUADRICEPS TENDON REPAIR Right 07/12/2022   Procedure: REPAIR QUADRICEP TENDON;  Surgeon: Ernie Cough, MD;  Location: [facility];  Service: Orthopedics;  Laterality: Right;   QUADRICEPS TENDON REPAIR Right 08/30/2022   Procedure: REPAIR QUADRICEP TENDON;  Surgeon: Ernie Cough, MD;  Location: [facility];  Service: Orthopedics;  Laterality: Right;   SHOULDER ARTHROSCOPY W/ ROTATOR CUFF REPAIR  2010   TONSILLECTOMY     Patient Active Problem List   Diagnosis Date Noted   Coronary artery calcification 05/09/2023   Rupture of quadriceps tendon 11/03/2022   Wound dehiscence 10/07/2022   Quadriceps tendon rupture, right, subsequent encounter 08/30/2022   Quadriceps tendon rupture, right, initial encounter 07/12/2022   Unable to care for self 07/08/2022   Dehydration 07/08/2022  Depression 07/08/2022   COPD (chronic obstructive pulmonary disease) (HCC) 07/08/2022   Sialoadenitis of submandibular gland 04/28/2022   Macular degeneration 03/01/2022   Myofascial pain 02/10/2022   Lumbar spondylosis 01/03/2022   Acquired clawfoot, left foot 12/16/2021   Acquired clawfoot, right foot 12/16/2021   Claw foot 12/14/2021   Gait abnormality 12/10/2021   Spinal stenosis at L4-L5 level 12/10/2021   Ataxia 11/22/2021   Lightheadedness 11/02/2021   Claudication of lower extremity (HCC) 10/21/2021   Atrophy of calf muscles 10/21/2021   Weakness generalized 10/21/2021   Focal neurological deficit 10/21/2021   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    Pain in joint of right hip 09/14/2021   High arches 07/22/2021   Hammertoes of both feet 07/22/2021   Neuropathy 07/22/2021   Constipation 05/28/2021   Disequilibrium 05/28/2021   History of colonic polyps 05/28/2021   Renal cyst 05/28/2021   Gastroesophageal reflux disease without esophagitis 05/05/2021   Ingrowing left great toenail 02/15/2021   S/P lumbar fusion 12/24/2020   Spinal stenosis of lumbar region 12/04/2020   Acquired complex renal cyst 11/23/2020   Functional gait abnormality 11/04/2020   Idiopathic neuropathy 11/02/2020   Tinea pedis of both feet 10/28/2020   Loop - Medtronic Linq 10/22/2020 10/22/2020   Benign hypertension with 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) 10/05/2020   Fall with injury 10/05/2020   Hematoma 09/16/2020   Normocytic anemia 09/14/2020   Leukocytosis 09/14/2020   AF (paroxysmal atrial fibrillation) (HCC) 09/14/2020   Chronic anticoagulation 09/14/2020   Hyponatremia 09/14/2020   Pain in left foot 01/22/2020   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   Pain in joint of right knee 12/27/2019   Recurrent falls 11/17/2019   Paroxysmal atrial fibrillation (HCC) 10/22/2019   Pain in right foot 10/14/2019   Falls 10/07/2019   Ataxia involving legs 08/12/2019   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   Urge incontinence 04/13/2018   Urinary urgency 04/13/2018   Pain in left knee 03/01/2018   Other intervertebral disc degeneration, lumbar region 06/16/2017   Acquired bilateral hammer toes 10/05/2015   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   Generalized anxiety disorder 05/12/2015   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   History of left-sided carotid endarterectomy 04/10/2014   Essential hypertension 04/10/2014   Occlusion and stenosis of carotid artery without mention of cerebral infarction 12/15/2011   Habitual alcohol use  12/01/2011   Carotid artery stenosis, symptomatic 11/30/2011   Cerebral infarction (HCC) 11/29/2011   Mixed hyperlipidemia     PCP: Bernardino Cone  REFERRING PROVIDER: Bernardino Cone  REFERRING DIAG: weakness, multiple falls  Rationale for Evaluation and Treatment: Rehabilitation  THERAPY DIAG:  Other abnormalities of gait and mobility  Repeated falls  Muscle weakness (generalized)  Contracture of muscle, multiple sites  Acute pain of right knee  Other low back pain  ONSET DATE: 10/07/22  SUBJECTIVE:  SUBJECTIVE STATEMENT: Doing well, some left hip/buttock pain   Patient asking about plan, he reports that he is worried that he will not be able to stay independent and live at home if he stops, he has no equipment and with the right knee's inability to extend and the left knee having issues with buckling due to neuropathy, he is worried about more falls and the strain on his wife  Doing all right, had one fall, fell in the shower yesterday. Did exercises before the showers, legs gave out  on him  PERTINENT HISTORY:  Cerebral infarct 10/21/21, lumbar fusion, recurrent falls, has significant neuropathy and this is the reason for most of his falls   PAIN:  Are you having pain? Yes: NPRS scale: 0/10 Pain location: right knee Pain description: ache  Aggravating factors: being on my feet  Relieving factors: movement  PRECAUTIONS: Fall  WEIGHT BEARING RESTRICTIONS: No  FALLS:  Has patient fallen in last 6 months? Yes. Number of falls every few days   LIVING ENVIRONMENT: Lives with: lives with their spouse Lives in: House/apartment Stairs: Yes: Internal: 20 steps; on right going up Has following equipment at home: [name] - 4 wheeled, Wheelchair (manual), shower chair, Tour manager, Grab  bars, Ramped entry, and trekking pole  OCCUPATION: Retired  PLOF: Independent, prior to a year ago he was driving used trekking poles to walk  PATIENT GOALS: live at home  NEXT MD VISIT:   OBJECTIVE:   DIAGNOSTIC FINDINGS:    LUMBAR SPINE FINDINGS: 1. Wide laminectomy and posterior pedicle screw and rod fixation at L4-5 without residual or recurrent stenosis. 2. Progressive adjacent level disease at L3-4 with moderate right and mild left subarticular narrowing and moderate foraminal stenosis bilaterally. Progression is predominantly due to facet disease 3. Progressive moderate facet hypertrophy at L1-2 with a progressive rightward disc protrusion resulting in progressive moderate right foraminal stenosis. Asymmetric endplate marrow edema on the right at L1-2 is consistent with progressive disease as well. 4. Otherwise stable degenerative change without other focal stenosis.   THORACIC SPINE FINDINGS:  On sagittal views the vertebral bodies have normal height and alignment.  Hemangioma within T9 vertebral body and slightly within T10 vertebral body.  Anterior kyphosis.  Mild scoliosis convex right centered at T8 level. The spinal cord is normal in size and appearance. The paraspinal soft tissues are unremarkable.     On axial views there is no spinal stenosis or foraminal narrowing.  Limited views of the aorta, kidneys, liver, lungs and paraspinal muscles are notable for multiple large right renal cysts ranging in size from 1.5 to 7 cm.   COGNITION: Overall cognitive status: Within functional limits for tasks assessed     SENSATION: WFL  MUSCLE LENGTH: Hamstrings: mod tightness on both sides  POSTURE: rounded shoulders and forward head  LOWER EXTREMITY ROM:   the right knee has HS, has no quad tendon on the right, unable to lift the leg, left leg WFL's , left hip and right hip and ankles WFL's   LOWER EXTREMITY MMT: 4-/5 for the LE's except for the right knee  extension UPPER EXTREMITY MMT:  4-/5 for shoulders and elbows   FUNCTIONAL TESTS:  5XSTS: 83 seconds really has to use his hands and is unsteady with standing Cannot stand on his own without holding onto something due to poor balance   Transfers:  needs set up and MinA for safety, is impulsive and unsafe at times. GAIT: He is w/c dependent    TODAY'S  TREATMENT:                                                                                                                              DATE:  10/31/23 15# rows 3x15 13# straight arm pulls 3x15 15# biceps 20# overhead triceps press Leg curls 45# 2x15, then right only 25# 10# leg extension left only PWR moves in sitting for trunk mobility 3# UE circuit 3# trunk rotation with reaching Passive right knee extension Feet on ball K2C, rotation, bridges Partial sit ups Black tband clamshells STM to the left hip with Tgun  10/24/23 15# rows  15# straight arm pulls 15# biceps 20# overhead tricep press 45# leg curls  then right only 25# 10# leg extension left On mat partial sit ups with 6# held at chest Elbow touches to mat each side Seated reach to cone and then extend back up PWR move Feet on ball K2C, rotation, bridge isometric abs Black tband clamshells Ball squeeze with bridge Passive stretch right knee extension Left side lying right hip abduction SLR on the right  10/10/23 15# biceps 3x15 15# chest press 3x15 15# straight arms  15# rows 20# triceps press overhead Discussion about his HEP with a 5# weight at home, trying to have him not do much overhead stuff 45# leg curls , right only 25# 2x15 each 5# standing marches with PT max A to keep leg bearing weight straight Patient wanted to weight so with a lot of effort got him on scale weight was 178# Passive stretch of the left knee to prevent contracture Feet on ball bridges Partial sit ups   10/06/23 Rows 15lb x15, x20 Shoulder Ext 15lb 2x20 Curls 10lb x20,  x15 Chest press 15lb 2x20 Leg press 50lb x 15, 40lb 2x15 At Mat Seated Crunches 2x15 Seated Trunk rotations with weighted ball Seated OHP blue ball 2x15 Horiz abd blue 2x10 Hip and black 2x15  09/26/23 At Mat Seated Crunches 2x15 Seated Trunk rotations with weighted ball Fitter press all bands w/ LLE 2x15  1black 1 blue band RLE 2x15 Military press 3lb 2x15  HS curls black 2x15   Rows black band 2x10  Horizontal abduction blue   Ankle R blue 2x15 15lb shoulder Ext 2x15 Biceps curls 10lb 2x10 Triceps Ext 20lb 2x15   09/21/23 Nustep level 6 x 7 minutes with arms and then 4 minutes without arms Rows 15lb 2x15 Extension 15lb 2x15 15# biceps 20# triceps Chest press 15lb HS curls 55lb 2x15, RLE 20b 2x15 Leg Ext LLE 15lb 2x15 Fitter press RLE 1 blue 1black, LLE all bands   09/12/23 Nustep level 6 x 7 minutes with arms and then 4 minutes without arms At mat able  Seated sit ups Front raises blue ball  Horiz abd black Fitter press all bands w/ LLE 2x15  1black 1 blue band RLE 2x15 Trunk rotations with blue ball  Rows black band 2x10 OHP 3lb dumbell   09/08/23 Nustep  level 6 x 7 minutes with arms and then 4 minutes without arms Rows 15lb 2x15 Extension 15lb 2x15 15# biceps 20# triceps Chest press 15lb At mat able  Seated sit ups Trunk rotations with blue ball  HS curls blue x 15 08/31/23 Nustep level 6 x 7 minutes with arms and then 4 minutes without arms 15# rows Lats 20lb 15# chest press 15# biceps 20# triceps 55# leg curls , left only 20# Leg Ext 10lb LLE 2x15 LLE leg Ext 10lb 2x15 Seated hip abd black band   08/29/23 Nustep level 6 x 8 minutes with arms and then 4 minutes without arms 15# rows 15# lats 15# chest press 15# biceps 20# triceps 55# leg curls , left only 25# Supine feet on ball, bridge, isometric abs Black tband clamshells Education on safety  08/24/23 Nustep level 6 x 7 minutes 15# Rows 15# straight arm  15# chest press 15#  biceps 20# triceps Passive stretch right knee into extension Feet on ball K2C, rotation, bridges, isometric abs Tband clamshells Ball b/n knees with bridge Black tband hip extension  08/22/23 Nustep level 6 x 6 minutes, then level 5 LE only x 5 minutes LEg curls 45# 2x15 left only 25# 2x10 LEft leg ext 10# 2x15 15# row, extension 10# chest press 15# biceps 20# triceps  08/18/23 Nustep level 6 x 6 minutes, LE only level 5 x 3 minutes Leg press 60lb 2x15 Chest press 10lb 2x15 Biceps Curls 10lb 2x10 Rows & Ext 15lb 2x15 At MAT  OHP blue ball 2x15  Seated trunk rotations blue ball   HS curls green 2x15 each 08/15/23 Nustep level 6 x 10 minutes In Pbars standing trying to get him to extend the right hip and have knee locked to bear weight on the right without the knee buckling LEg press 60# 2x15, then left only 40# Supine feet on ball K2C, rotation, bridges, isometric abs Passive stretch right knee Black tband clamshells Black tband right hip extension Passive stretch right knee into extension Bridges  PATIENT EDUCATION:  Education details: POC Person educated: Patient Education method: Explanation Education comprehension: verbalized understanding  HOME EXERCISE PROGRAM:  ASSESSMENT:  CLINICAL IMPRESSION: I am trying to incorporate some total body exercises as well as some PWR moves to get some better trunk control and mobility, he is having some left hip pain, not sure if he is overdoing it on the nustep at home, I asked him to limit to 25 minutes and take two days off    We have set up the maintenance program following medicare guidelines.  He qualifies for this due to the inability to be safe, has had multiple falls and is impulsive, tends to think he can do more than he can.  Patient has not had a fall in the past 5 weeks, he remains very unsafe at times and with any transfer needs min A or close CGA.  Tends to not understand that the right knee has no quad to hold the  leg straight.  I  In the long run he is impulsive and unsafe and cannot exercise on his own to maintain his level, he is at a high risk for falls and there is no gym that has the ability to assist him. Session completed at mat table to eliminate trunk support for improved core stabilization. Core weakness with seated OHP.  Pt did report a fall yesterday stated he was standing pulling his pants up, I advised pt that he shouldn't put weight in  his RLE, He ws adamant that he could put a little on it.     Justifying Skilled Maintenance for Rehab Services:   The goal of a skilled maintenance therapy program is to maintain the patient's current functional status or to prevent or slow deterioration. If a patient who is receiving restorative therapy then requires skilled maintenance therapy based on the skills and judgment of the therapist, development of a maintenance program would occur during the last visit for restorative treatment.    Does the patient need treatment that necessitates skilled services that can only be provided by a skilled provider? Can a caregiver provide these services why or why not? 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  He is impulsive and at times very unsafe he could not do this on his own and his wife could not help him., history of COPD, CKD, depression, HTN, kyphosis, a-fib, PVD, spinal stenosis, and stroke as well as RC repair.  These limit the patient's independent carryover with PT  exercises and require max cueing for optimal performance of safety and to strengthen other mms for his future in therapy sessions.    The patient requires the skill of PT to help with ongoing exercise and mobility to prevent rapid and significant decline in functional mobility, strength, and to limit falls  with his safety.  Therefore, continued skilled maintenance therapy is recommended for this patient.    OBJECTIVE IMPAIRMENTS: Abnormal gait, cardiopulmonary status limiting activity, decreased activity tolerance, decreased balance, decreased coordination, decreased endurance, decreased mobility, difficulty walking, decreased ROM, decreased strength, improper body mechanics, postural dysfunction, and poor safety.   REHAB POTENTIAL: Fair dependent on carry over and patient compliance  CLINICAL DECISION MAKING: Evolving/moderate complexity  EVALUATION COMPLEXITY: Moderate  GOALS: Goals for skilled maintenance:     Long Term Goals: Target Date 12/13/23     The patient will preserve functional mobility within the home environment. Baseline: uses a w/c            Goal Status: Progressing 10/06/23   2. The patient will maintain strength for transfers.            Baseline: able to perform with set up and at times requires mod A and at other time may need Max A due to poor safety and impulsiveness            Goal Status: progressing 10/24/23   3. The patient will minimize fall frequency.            Baseline: he has been having about a fall a month            Goal Status: ongoing 10/10/23   4. Safe with HEP at home and with equipment he has            Baseline: does HEP             Goal Status: progressing 10/31/23   PLAN:  PT FREQUENCY: 2x/week  PT DURATION: 12 weeks  PLANNED INTERVENTIONS: Therapeutic exercises, Therapeutic activity, Neuromuscular re-education, Balance training, Gait training, Patient/Family education, Self Care, Joint mobilization, Stair training, Cryotherapy, Moist heat, and Manual therapy.  PLAN FOR NEXT SESSION: Have sat up the maintenance program for Mr. Strozier and progressing with this, biggest issue he is unsafe  and cannot do on his own or with his wife  OBADIAH OZELL ORN, PT

## 2023-11-02 ENCOUNTER — Ambulatory Visit
Admission: RE | Admit: 2023-11-02 | Discharge: 2023-11-02 | Disposition: A | Discharge status: Home / Self Care | Source: Ambulatory Visit | Attending: Internal Medicine

## 2023-11-02 ENCOUNTER — Ambulatory Visit
Admission: RE | Admit: 2023-11-02 | Discharge: 2023-11-02 | Disposition: A | Discharge status: Home / Self Care | Source: Ambulatory Visit | Attending: Internal Medicine | Admitting: Internal Medicine

## 2023-11-02 DIAGNOSIS — N644 Mastodynia: Secondary | ICD-10-CM

## 2023-11-05 ENCOUNTER — Encounter: Payer: Self-pay | Admitting: Internal Medicine

## 2023-11-05 DIAGNOSIS — R531 Weakness: Secondary | ICD-10-CM

## 2023-11-05 DIAGNOSIS — I10 Essential (primary) hypertension: Secondary | ICD-10-CM

## 2023-11-05 DIAGNOSIS — R7303 Prediabetes: Secondary | ICD-10-CM

## 2023-11-08 ENCOUNTER — Ambulatory Visit: Admitting: Physical Therapy

## 2023-11-08 ENCOUNTER — Encounter: Payer: Self-pay | Admitting: Physical Therapy

## 2023-11-08 DIAGNOSIS — M25561 Pain in right knee: Secondary | ICD-10-CM

## 2023-11-08 DIAGNOSIS — M6281 Muscle weakness (generalized): Secondary | ICD-10-CM

## 2023-11-08 DIAGNOSIS — M5459 Other low back pain: Secondary | ICD-10-CM

## 2023-11-08 DIAGNOSIS — M6249 Contracture of muscle, multiple sites: Secondary | ICD-10-CM

## 2023-11-08 DIAGNOSIS — R2689 Other abnormalities of gait and mobility: Secondary | ICD-10-CM | POA: Diagnosis not present

## 2023-11-08 DIAGNOSIS — R296 Repeated falls: Secondary | ICD-10-CM

## 2023-11-08 NOTE — Therapy (Signed)
 OUTPATIENT PHYSICAL THERAPY TREATMENT    Patient Name: Brandon Mendoza. MRN: [id number] DOB:1941-03-03, 83 y.o., male Today's Date: 11/08/2023  END OF SESSION:  PT End of Session - 11/08/23 0847     Visit Number 42    Date for PT Re-Evaluation 11/10/23    Authorization Type Medicare    PT Start Time 0844    PT Stop Time 0930    PT Time Calculation (min) 46 min    Activity Tolerance Patient tolerated treatment well    Behavior During Therapy [facility] for tasks assessed/performed;Impulsive          Past Medical History:  Diagnosis Date   Acquired clawfoot, right foot 12/16/2021   Anxiety    Atrophy of calf muscles 10/21/2021   Duplex calves was negative for significant blockage EMG was done at [facility] MRI lumbar spine was also done at [facility]   Carotid artery occlusion    CKD (chronic kidney disease)    Stage 3   COPD (chronic obstructive pulmonary disease) (HCC)    Depression    Dysrhythmia    PAF, atrial tachycardia   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   The spinal cord appears normal. 2.   No spinal stenosis. 3.   Mild multilevel degenerative changes as detailed above that do not lead to spinal stenosis or nerve root compression. 4.   T2 hyperintense foci within the pons consistent with chronic microvascular ischemic changes.   Loop - Medtronic Linq 10/22/2020 10/22/2020   Nocturia    Paroxysmal atrial fibrillation (HCC)    Peripheral neuropathy    Peripheral vascular disease (HCC)    Sleep apnea    on 6 cm, nasal pillow   Spinal stenosis    Stroke (HCC) 05/01/2011   ischemic   Weakness generalized 10/21/2021   Severe, legs and arms   Past Surgical History:  Procedure Laterality Date   CAROTID ENDARTERECTOMY  12/09/11   Left cea    ENDARTERECTOMY  12/09/2011   Procedure: ENDARTERECTOMY CAROTID;  Surgeon: Krystal JULIANNA Doing, MD;  Location: [facility] OR;  Service: Vascular;  Laterality: Left;   EYE SURGERY     rt retina detachment   HAMMER TOE SURGERY  2004   HERNIA REPAIR  2006   I & D EXTREMITY Right 10/07/2022   Procedure: IRRIGATION AND DEBRIDEMENT EXTREMITY WITH WOUND CLOSURE;  Surgeon: Burnetta Aures, MD;  Location: [facility] ORS;  Service: Orthopedics;  Laterality: Right;   QUADRICEPS TENDON REPAIR Right 07/12/2022   Procedure: REPAIR QUADRICEP TENDON;  Surgeon: Ernie Cough, MD;  Location: [facility];  Service: Orthopedics;  Laterality: Right;   QUADRICEPS TENDON REPAIR Right 08/30/2022   Procedure: REPAIR QUADRICEP TENDON;  Surgeon: Ernie Cough, MD;  Location: [facility];  Service: Orthopedics;  Laterality: Right;   SHOULDER ARTHROSCOPY W/ ROTATOR CUFF REPAIR  2010   TONSILLECTOMY     Patient Active Problem List   Diagnosis Date Noted   Coronary artery calcification 05/09/2023   Rupture of quadriceps tendon 11/03/2022   Wound dehiscence 10/07/2022   Quadriceps tendon rupture, right, subsequent encounter 08/30/2022   Quadriceps tendon rupture, right, initial encounter 07/12/2022   Unable to care for self 07/08/2022   Dehydration 07/08/2022  Depression 07/08/2022   COPD (chronic obstructive pulmonary disease) (HCC) 07/08/2022   Sialoadenitis of submandibular gland 04/28/2022   Macular degeneration 03/01/2022   Myofascial pain 02/10/2022   Lumbar spondylosis 01/03/2022   Acquired clawfoot, left foot 12/16/2021   Acquired clawfoot, right foot 12/16/2021   Claw foot 12/14/2021   Gait abnormality 12/10/2021   Spinal stenosis at L4-L5 level 12/10/2021   Ataxia 11/22/2021   Lightheadedness 11/02/2021   Claudication of lower extremity (HCC) 10/21/2021   Atrophy of calf muscles 10/21/2021   Weakness generalized 10/21/2021   Focal neurological deficit 10/21/2021   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    Pain in joint of right hip 09/14/2021   High arches 07/22/2021   Hammertoes of both feet 07/22/2021   Neuropathy 07/22/2021   Constipation 05/28/2021   Disequilibrium 05/28/2021   History of colonic polyps 05/28/2021   Renal cyst 05/28/2021   Gastroesophageal reflux disease without esophagitis 05/05/2021   Ingrowing left great toenail 02/15/2021   S/P lumbar fusion 12/24/2020   Spinal stenosis of lumbar region 12/04/2020   Acquired complex renal cyst 11/23/2020   Functional gait abnormality 11/04/2020   Idiopathic neuropathy 11/02/2020   Tinea pedis of both feet 10/28/2020   Loop - Medtronic Linq 10/22/2020 10/22/2020   Benign hypertension with 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) 10/05/2020   Fall with injury 10/05/2020   Hematoma 09/16/2020   Normocytic anemia 09/14/2020   Leukocytosis 09/14/2020   AF (paroxysmal atrial fibrillation) (HCC) 09/14/2020   Chronic anticoagulation 09/14/2020   Hyponatremia 09/14/2020   Pain in left foot 01/22/2020   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   Pain in joint of right knee 12/27/2019   Recurrent falls 11/17/2019   Paroxysmal atrial fibrillation (HCC) 10/22/2019   Pain in right foot 10/14/2019   Falls 10/07/2019   Ataxia involving legs 08/12/2019   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   Urge incontinence 04/13/2018   Urinary urgency 04/13/2018   Pain in left knee 03/01/2018   Other intervertebral disc degeneration, lumbar region 06/16/2017   Acquired bilateral hammer toes 10/05/2015   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   Generalized anxiety disorder 05/12/2015   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   History of left-sided carotid endarterectomy 04/10/2014   Essential hypertension 04/10/2014   Occlusion and stenosis of carotid artery without mention of cerebral infarction 12/15/2011   Habitual alcohol use  12/01/2011   Carotid artery stenosis, symptomatic 11/30/2011   Cerebral infarction (HCC) 11/29/2011   Mixed hyperlipidemia     PCP: Bernardino Cone  REFERRING PROVIDER: Bernardino Cone  REFERRING DIAG: weakness, multiple falls  Rationale for Evaluation and Treatment: Rehabilitation  THERAPY DIAG:  Other abnormalities of gait and mobility  Repeated falls  Muscle weakness (generalized)  Contracture of muscle, multiple sites  Acute pain of right knee  Other low back pain  ONSET DATE: 10/07/22  SUBJECTIVE:  SUBJECTIVE STATEMENT: No falls, some pain still   Patient asking about plan, he reports that he is worried that he will not be able to stay independent and live at home if he stops, he has no equipment and with the right knee's inability to extend and the left knee having issues with buckling due to neuropathy, he is worried about more falls and the strain on his wife  Doing all right, had one fall, fell in the shower yesterday. Did exercises before the showers, legs gave out  on him  PERTINENT HISTORY:  Cerebral infarct 10/21/21, lumbar fusion, recurrent falls, has significant neuropathy and this is the reason for most of his falls   PAIN:  Are you having pain? Yes: NPRS scale: 0/10 Pain location: right knee Pain description: ache  Aggravating factors: being on my feet  Relieving factors: movement  PRECAUTIONS: Fall  WEIGHT BEARING RESTRICTIONS: No  FALLS:  Has patient fallen in last 6 months? Yes. Number of falls every few days   LIVING ENVIRONMENT: Lives with: lives with their spouse Lives in: House/apartment Stairs: Yes: Internal: 20 steps; on right going up Has following equipment at home: [name] - 4 wheeled, Wheelchair (manual), shower chair, Tour manager, Grab bars, Ramped  entry, and trekking pole  OCCUPATION: Retired  PLOF: Independent, prior to a year ago he was driving used trekking poles to walk  PATIENT GOALS: live at home  NEXT MD VISIT:   OBJECTIVE:   DIAGNOSTIC FINDINGS:    LUMBAR SPINE FINDINGS: 1. Wide laminectomy and posterior pedicle screw and rod fixation at L4-5 without residual or recurrent stenosis. 2. Progressive adjacent level disease at L3-4 with moderate right and mild left subarticular narrowing and moderate foraminal stenosis bilaterally. Progression is predominantly due to facet disease 3. Progressive moderate facet hypertrophy at L1-2 with a progressive rightward disc protrusion resulting in progressive moderate right foraminal stenosis. Asymmetric endplate marrow edema on the right at L1-2 is consistent with progressive disease as well. 4. Otherwise stable degenerative change without other focal stenosis.   THORACIC SPINE FINDINGS:  On sagittal views the vertebral bodies have normal height and alignment.  Hemangioma within T9 vertebral body and slightly within T10 vertebral body.  Anterior kyphosis.  Mild scoliosis convex right centered at T8 level. The spinal cord is normal in size and appearance. The paraspinal soft tissues are unremarkable.     On axial views there is no spinal stenosis or foraminal narrowing.  Limited views of the aorta, kidneys, liver, lungs and paraspinal muscles are notable for multiple large right renal cysts ranging in size from 1.5 to 7 cm.   COGNITION: Overall cognitive status: Within functional limits for tasks assessed     SENSATION: WFL  MUSCLE LENGTH: Hamstrings: mod tightness on both sides  POSTURE: rounded shoulders and forward head  LOWER EXTREMITY ROM:   the right knee has HS, has no quad tendon on the right, unable to lift the leg, left leg WFL's , left hip and right hip and ankles WFL's   LOWER EXTREMITY MMT: 4-/5 for the LE's except for the right knee extension UPPER  EXTREMITY MMT:  4-/5 for shoulders and elbows   FUNCTIONAL TESTS:  5XSTS: 83 seconds really has to use his hands and is unsteady with standing Cannot stand on his own without holding onto something due to poor balance   Transfers:  needs set up and MinA for safety, is impulsive and unsafe at times. GAIT: He is w/c dependent    TODAY'S TREATMENT:  DATE:  11/08/23 15# rows 15# straight arm pulls 10# chest press 20# triceps over head 15# biceps Standing in pbars, left hip flexion with 5#, then PT bracing right iwht hip flexion, did some right hip abduction 45# leg curls 20# right only 10# left leg extension Feet on ball rotation, bridge, isometric abs Passive stretch right knee into extension Black tband clamshells  10/31/23 15# rows 3x15 13# straight arm pulls 3x15 15# biceps 20# overhead triceps press Leg curls 45# 2x15, then right only 25# 10# leg extension left only PWR moves in sitting for trunk mobility 3# UE circuit 3# trunk rotation with reaching Passive right knee extension Feet on ball K2C, rotation, bridges Partial sit ups Black tband clamshells STM to the left hip with Tgun  10/24/23 15# rows  15# straight arm pulls 15# biceps 20# overhead tricep press 45# leg curls  then right only 25# 10# leg extension left On mat partial sit ups with 6# held at chest Elbow touches to mat each side Seated reach to cone and then extend back up PWR move Feet on ball K2C, rotation, bridge isometric abs Black tband clamshells Ball squeeze with bridge Passive stretch right knee extension Left side lying right hip abduction SLR on the right  10/10/23 15# biceps 3x15 15# chest press 3x15 15# straight arms  15# rows 20# triceps press overhead Discussion about his HEP with a 5# weight at home, trying to have him not do much overhead stuff 45# leg  curls , right only 25# 2x15 each 5# standing marches with PT max A to keep leg bearing weight straight Patient wanted to weight so with a lot of effort got him on scale weight was 178# Passive stretch of the left knee to prevent contracture Feet on ball bridges Partial sit ups   10/06/23 Rows 15lb x15, x20 Shoulder Ext 15lb 2x20 Curls 10lb x20, x15 Chest press 15lb 2x20 Leg press 50lb x 15, 40lb 2x15 At Mat Seated Crunches 2x15 Seated Trunk rotations with weighted ball Seated OHP blue ball 2x15 Horiz abd blue 2x10 Hip and black 2x15  09/26/23 At Mat Seated Crunches 2x15 Seated Trunk rotations with weighted ball Fitter press all bands w/ LLE 2x15  1black 1 blue band RLE 2x15 Military press 3lb 2x15  HS curls black 2x15   Rows black band 2x10  Horizontal abduction blue   Ankle R blue 2x15 15lb shoulder Ext 2x15 Biceps curls 10lb 2x10 Triceps Ext 20lb 2x15   09/21/23 Nustep level 6 x 7 minutes with arms and then 4 minutes without arms Rows 15lb 2x15 Extension 15lb 2x15 15# biceps 20# triceps Chest press 15lb HS curls 55lb 2x15, RLE 20b 2x15 Leg Ext LLE 15lb 2x15 Fitter press RLE 1 blue 1black, LLE all bands   PATIENT EDUCATION:  Education details: POC Person educated: Patient Education method: Explanation Education comprehension: verbalized understanding  HOME EXERCISE PROGRAM:  ASSESSMENT:  CLINICAL IMPRESSION: WE continue to work on his overall health and hopefully his safety with transfers.  He has not had a fall in about a month.  HE is doing the Nustep at home he is still having some hip pain, he does report not taking my advice fully about taking days off.  He still is unsafe at time with the transfers especially when he is fatigued, he is still impulsive and goes too fast at times  We have set up the maintenance program following medicare guidelines.  He qualifies for this due to the inability to be  safe, has had multiple falls and is impulsive, tends to  think he can do more than he can.  Patient has not had a fall in the past 5 weeks, he remains very unsafe at times and with any transfer needs min A or close CGA.  Tends to not understand that the right knee has no quad to hold the leg straight.  I  In the long run he is impulsive and unsafe and cannot exercise on his own to maintain his level, he is at a high risk for falls and there is no gym that has the ability to assist him. Session completed at mat table to eliminate trunk support for improved core stabilization. Core weakness with seated OHP.  Pt did report a fall yesterday stated he was standing pulling his pants up, I advised pt that he shouldn't put weight in his RLE, He ws adamant that he could put a little on it.     Justifying Skilled Maintenance for Rehab Services:   The goal of a skilled maintenance therapy program is to maintain the patient's current functional status or to prevent or slow deterioration. If a patient who is receiving restorative therapy then requires skilled maintenance therapy based on the skills and judgment of the therapist, development of a maintenance program would occur during the last visit for restorative treatment.    Does the patient need treatment that necessitates skilled services that can only be provided by a skilled provider? Can a caregiver provide these services why or why not? 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  He is impulsive and at times very unsafe he could not do this on his own and his wife could not help him., history of COPD, CKD, depression, HTN, kyphosis, a-fib, PVD, spinal stenosis, and stroke as well as RC repair.  These limit the patient's independent carryover with PT  exercises and require max cueing for optimal performance of safety and to  strengthen other mms for his future in therapy sessions.    The patient requires the skill of PT to help with ongoing exercise and mobility to prevent rapid and significant decline in functional mobility, strength, and to limit falls with his safety.  Therefore, continued skilled maintenance therapy is recommended for this patient.    OBJECTIVE IMPAIRMENTS: Abnormal gait, cardiopulmonary status limiting activity, decreased activity tolerance, decreased balance, decreased coordination, decreased endurance, decreased mobility, difficulty walking, decreased ROM, decreased strength, improper body mechanics, postural dysfunction, and poor safety.   REHAB POTENTIAL: Fair dependent on carry over and patient compliance  CLINICAL DECISION MAKING: Evolving/moderate complexity  EVALUATION COMPLEXITY: Moderate  GOALS: Goals for skilled maintenance:     Long Term Goals: Target Date 12/13/23     The patient will preserve functional mobility within the home environment. Baseline: uses a w/c            Goal Status: Progressing 10/06/23   2. The patient will maintain strength for transfers.            Baseline: able to perform with set up and at times requires mod A and at other time may need Max A due to poor safety and impulsiveness            Goal Status: progressing 10/24/23  3. The patient will minimize fall frequency.            Baseline: he has been having about a fall a month            Goal Status: ongoing 10/10/23   4. Safe with HEP at home and with equipment he has            Baseline: does HEP             Goal Status: progressing 10/31/23   PLAN:  PT FREQUENCY: 2x/week  PT DURATION: 12 weeks  PLANNED INTERVENTIONS: Therapeutic exercises, Therapeutic activity, Neuromuscular re-education, Balance training, Gait training, Patient/Family education, Self Care, Joint mobilization, Stair training, Cryotherapy, Moist heat, and Manual therapy.  PLAN FOR NEXT SESSION: Have sat up the  maintenance program for Mr. Lammert and progressing with this, biggest issue he is unsafe and cannot do on his own or with his wife.  Assess next visit  OBADIAH OZELL ORN, PT

## 2023-11-09 ENCOUNTER — Ambulatory Visit (INDEPENDENT_AMBULATORY_CARE_PROVIDER_SITE_OTHER)

## 2023-11-09 DIAGNOSIS — I480 Paroxysmal atrial fibrillation: Secondary | ICD-10-CM

## 2023-11-09 LAB — CUP PACEART REMOTE DEVICE CHECK
Date Time Interrogation Session: 20250813232011
Implantable Pulse Generator Implant Date: 20220728

## 2023-11-12 ENCOUNTER — Ambulatory Visit: Payer: Self-pay | Admitting: Cardiology

## 2023-11-13 ENCOUNTER — Telehealth: Payer: Self-pay

## 2023-11-13 ENCOUNTER — Other Ambulatory Visit: Payer: Self-pay | Admitting: Internal Medicine

## 2023-11-13 NOTE — Telephone Encounter (Unsigned)
 Copied from CRM [id number]. Topic: Clinical - Medication Refill >> Nov 13, 2023  8:47 AM Macario HERO wrote: Medication: flecainide  (TAMBOCOR ) 100 MG tablet [[id number]]  Has the patient contacted their pharmacy? No (Agent: If no, request that the patient contact the pharmacy for the refill. If patient does not wish to contact the pharmacy document the reason why and proceed with request.) (Agent: If yes, when and what did the pharmacy advise?)  This is the patient's preferred pharmacy:   [facility] [id number] [place], [place] - [address] [place] [address] Phone: [phone] Fax: [phone]    Is this the correct pharmacy for this prescription? Yes If no, delete pharmacy and type the correct one.   Has the prescription been filled recently? No  Is the patient out of the medication? No, will be soon  Has the patient been seen for an appointment in the last year OR does the patient have an upcoming appointment? Yes  Can we respond through MyChart? Yes  Agent: Please be advised that Rx refills may take up to 3 business days. We ask that you follow-up with your pharmacy.

## 2023-11-13 NOTE — Telephone Encounter (Signed)
 Copied from CRM [id number]. Topic: Clinical - Request for Lab/Test Order >> Nov 13, 2023  8:52 AM Brandon Mendoza wrote: Reason for CRM: Patient stated that Dr. Jesus wants him to get a pre-diabetic screening at [facility] in [place]. So he is following up because he has not heard anything from the office about this.  Please advise what needs to be done

## 2023-11-14 ENCOUNTER — Ambulatory Visit: Admitting: Physical Therapy

## 2023-11-14 ENCOUNTER — Encounter: Payer: Self-pay | Admitting: Physical Therapy

## 2023-11-14 DIAGNOSIS — M6281 Muscle weakness (generalized): Secondary | ICD-10-CM

## 2023-11-14 DIAGNOSIS — M25561 Pain in right knee: Secondary | ICD-10-CM

## 2023-11-14 DIAGNOSIS — R2689 Other abnormalities of gait and mobility: Secondary | ICD-10-CM | POA: Diagnosis not present

## 2023-11-14 DIAGNOSIS — M5459 Other low back pain: Secondary | ICD-10-CM

## 2023-11-14 DIAGNOSIS — R296 Repeated falls: Secondary | ICD-10-CM

## 2023-11-14 DIAGNOSIS — M6249 Contracture of muscle, multiple sites: Secondary | ICD-10-CM

## 2023-11-14 NOTE — Therapy (Signed)
 OUTPATIENT PHYSICAL THERAPY TREATMENT    Patient Name: Brandon Mendoza. MRN: [id number] DOB:1940-11-04, 83 y.o., male Today's Date: 11/14/2023  END OF SESSION:  PT End of Session - 11/14/23 0852     Visit Number 43    Date for PT Re-Evaluation 12/15/23    Authorization Type Medicare    PT Start Time 0842    PT Stop Time 0927    PT Time Calculation (min) 45 min    Activity Tolerance Patient tolerated treatment well    Behavior During Therapy [facility] for tasks assessed/performed;Impulsive          Past Medical History:  Diagnosis Date   Acquired clawfoot, right foot 12/16/2021   Anxiety    Atrophy of calf muscles 10/21/2021   Duplex calves was negative for significant blockage EMG was done at [facility] MRI lumbar spine was also done at [facility]   Carotid artery occlusion    CKD (chronic kidney disease)    Stage 3   COPD (chronic obstructive pulmonary disease) (HCC)    Depression    Dysrhythmia    PAF, atrial tachycardia   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   The spinal cord appears normal. 2.   No spinal stenosis. 3.   Mild multilevel degenerative changes as detailed above that do not lead to spinal stenosis or nerve root compression. 4.   T2 hyperintense foci within the pons consistent with chronic microvascular ischemic changes.   Loop - Medtronic Linq 10/22/2020 10/22/2020   Nocturia    Paroxysmal atrial fibrillation (HCC)    Peripheral neuropathy    Peripheral vascular disease (HCC)    Sleep apnea    on 6 cm, nasal pillow   Spinal stenosis    Stroke (HCC) 05/01/2011   ischemic   Weakness generalized 10/21/2021   Severe, legs and arms   Past Surgical History:  Procedure Laterality Date   CAROTID ENDARTERECTOMY  12/09/11   Left cea    ENDARTERECTOMY  12/09/2011   Procedure: ENDARTERECTOMY CAROTID;  Surgeon: Krystal JULIANNA Doing, MD;  Location: [facility] OR;  Service: Vascular;  Laterality: Left;   EYE SURGERY     rt retina detachment   HAMMER TOE SURGERY  2004   HERNIA REPAIR  2006   I & D EXTREMITY Right 10/07/2022   Procedure: IRRIGATION AND DEBRIDEMENT EXTREMITY WITH WOUND CLOSURE;  Surgeon: Burnetta Aures, MD;  Location: [facility] ORS;  Service: Orthopedics;  Laterality: Right;   QUADRICEPS TENDON REPAIR Right 07/12/2022   Procedure: REPAIR QUADRICEP TENDON;  Surgeon: Ernie Cough, MD;  Location: [facility];  Service: Orthopedics;  Laterality: Right;   QUADRICEPS TENDON REPAIR Right 08/30/2022   Procedure: REPAIR QUADRICEP TENDON;  Surgeon: Ernie Cough, MD;  Location: [facility];  Service: Orthopedics;  Laterality: Right;   SHOULDER ARTHROSCOPY W/ ROTATOR CUFF REPAIR  2010   TONSILLECTOMY     Patient Active Problem List   Diagnosis Date Noted   Coronary artery calcification 05/09/2023   Rupture of quadriceps tendon 11/03/2022   Wound dehiscence 10/07/2022   Quadriceps tendon rupture, right, subsequent encounter 08/30/2022   Quadriceps tendon rupture, right, initial encounter 07/12/2022   Unable to care for self 07/08/2022   Dehydration 07/08/2022  Depression 07/08/2022   COPD (chronic obstructive pulmonary disease) (HCC) 07/08/2022   Sialoadenitis of submandibular gland 04/28/2022   Macular degeneration 03/01/2022   Myofascial pain 02/10/2022   Lumbar spondylosis 01/03/2022   Acquired clawfoot, left foot 12/16/2021   Acquired clawfoot, right foot 12/16/2021   Claw foot 12/14/2021   Gait abnormality 12/10/2021   Spinal stenosis at L4-L5 level 12/10/2021   Ataxia 11/22/2021   Lightheadedness 11/02/2021   Claudication of lower extremity (HCC) 10/21/2021   Atrophy of calf muscles 10/21/2021   Weakness generalized 10/21/2021   Focal neurological deficit 10/21/2021   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    Pain in joint of right hip 09/14/2021   High arches 07/22/2021   Hammertoes of both feet 07/22/2021   Neuropathy 07/22/2021   Constipation 05/28/2021   Disequilibrium 05/28/2021   History of colonic polyps 05/28/2021   Renal cyst 05/28/2021   Gastroesophageal reflux disease without esophagitis 05/05/2021   Ingrowing left great toenail 02/15/2021   S/P lumbar fusion 12/24/2020   Spinal stenosis of lumbar region 12/04/2020   Acquired complex renal cyst 11/23/2020   Functional gait abnormality 11/04/2020   Idiopathic neuropathy 11/02/2020   Tinea pedis of both feet 10/28/2020   Loop - Medtronic Linq 10/22/2020 10/22/2020   Benign hypertension with 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) 10/05/2020   Fall with injury 10/05/2020   Hematoma 09/16/2020   Normocytic anemia 09/14/2020   Leukocytosis 09/14/2020   AF (paroxysmal atrial fibrillation) (HCC) 09/14/2020   Chronic anticoagulation 09/14/2020   Hyponatremia 09/14/2020   Pain in left foot 01/22/2020   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   Pain in joint of right knee 12/27/2019   Recurrent falls 11/17/2019   Paroxysmal atrial fibrillation (HCC) 10/22/2019   Pain in right foot 10/14/2019   Falls 10/07/2019   Ataxia involving legs 08/12/2019   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   Urge incontinence 04/13/2018   Urinary urgency 04/13/2018   Pain in left knee 03/01/2018   Other intervertebral disc degeneration, lumbar region 06/16/2017   Acquired bilateral hammer toes 10/05/2015   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   Generalized anxiety disorder 05/12/2015   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   History of left-sided carotid endarterectomy 04/10/2014   Essential hypertension 04/10/2014   Occlusion and stenosis of carotid artery without mention of cerebral infarction 12/15/2011   Habitual alcohol use  12/01/2011   Carotid artery stenosis, symptomatic 11/30/2011   Cerebral infarction (HCC) 11/29/2011   Mixed hyperlipidemia     PCP: Bernardino Cone  REFERRING PROVIDER: Bernardino Cone  REFERRING DIAG: weakness, multiple falls  Rationale for Evaluation and Treatment: Rehabilitation  THERAPY DIAG:  Other abnormalities of gait and mobility  Repeated falls  Muscle weakness (generalized)  Contracture of muscle, multiple sites  Acute pain of right knee  Other low back pain  ONSET DATE: 10/07/22  SUBJECTIVE:  SUBJECTIVE STATEMENT: Reports a fall over the weekend, reports that it was after the Nustep and his legs were tired and he fell transferring from power chair into the wheelchair and fell, he was able to pull himself back up  Patient asking about plan, he reports that he is worried that he will not be able to stay independent and live at home if he stops, he has no equipment and with the right knee's inability to extend and the left knee having issues with buckling due to neuropathy, he is worried about more falls and the strain on his wife  Doing all right, had one fall, fell in the shower yesterday. Did exercises before the showers, legs gave out  on him  PERTINENT HISTORY:  Cerebral infarct 10/21/21, lumbar fusion, recurrent falls, has significant neuropathy and this is the reason for most of his falls   PAIN:  Are you having pain? Yes: NPRS scale: 0/10 Pain location: right knee Pain description: ache  Aggravating factors: being on my feet  Relieving factors: movement  PRECAUTIONS: Fall  WEIGHT BEARING RESTRICTIONS: No  FALLS:  Has patient fallen in last 6 months? Yes. Number of falls every few days   LIVING ENVIRONMENT: Lives with: lives with their spouse Lives in:  House/apartment Stairs: Yes: Internal: 20 steps; on right going up Has following equipment at home: [name] - 4 wheeled, Wheelchair (manual), shower chair, Tour manager, Grab bars, Ramped entry, and trekking pole  OCCUPATION: Retired  PLOF: Independent, prior to a year ago he was driving used trekking poles to walk  PATIENT GOALS: live at home  NEXT MD VISIT:   OBJECTIVE:   DIAGNOSTIC FINDINGS:    LUMBAR SPINE FINDINGS: 1. Wide laminectomy and posterior pedicle screw and rod fixation at L4-5 without residual or recurrent stenosis. 2. Progressive adjacent level disease at L3-4 with moderate right and mild left subarticular narrowing and moderate foraminal stenosis bilaterally. Progression is predominantly due to facet disease 3. Progressive moderate facet hypertrophy at L1-2 with a progressive rightward disc protrusion resulting in progressive moderate right foraminal stenosis. Asymmetric endplate marrow edema on the right at L1-2 is consistent with progressive disease as well. 4. Otherwise stable degenerative change without other focal stenosis.   THORACIC SPINE FINDINGS:  On sagittal views the vertebral bodies have normal height and alignment.  Hemangioma within T9 vertebral body and slightly within T10 vertebral body.  Anterior kyphosis.  Mild scoliosis convex right centered at T8 level. The spinal cord is normal in size and appearance. The paraspinal soft tissues are unremarkable.     On axial views there is no spinal stenosis or foraminal narrowing.  Limited views of the aorta, kidneys, liver, lungs and paraspinal muscles are notable for multiple large right renal cysts ranging in size from 1.5 to 7 cm.   COGNITION: Overall cognitive status: Within functional limits for tasks assessed     SENSATION: WFL  MUSCLE LENGTH: Hamstrings: mod tightness on both sides  POSTURE: rounded shoulders and forward head  LOWER EXTREMITY ROM:   the right knee has HS, has no quad tendon  on the right, unable to lift the leg, left leg WFL's , left hip and right hip and ankles WFL's   LOWER EXTREMITY MMT: 4-/5 for the LE's except for the right knee extension UPPER EXTREMITY MMT:  4-/5 for shoulders and elbows   FUNCTIONAL TESTS:  5XSTS: 83 seconds really has to use his hands and is unsteady with standing Cannot stand on his own without holding onto  something due to poor balance   Transfers:  needs set up and MinA for safety, is impulsive and unsafe at times. GAIT: He is w/c dependent    TODAY'S TREATMENT:                                                                                                                              DATE:  11/14/23 Standing in the Pbars marching with PT blocking knees, toe raises 45# HS curls, then right only 20# 10# left only knee extension 15# rows 15# straight arm pulls Feet on ball K2C, rotation, bridges and isometric abs PAssive right knee extension, left piriformis stretch Black tband clamshells Ball b/n knees squeeze with bridge Asked him to give himself time after the nustep for his legs to recover before transfers  11/08/23 15# rows 15# straight arm pulls 10# chest press 20# triceps over head 15# biceps Standing in pbars, left hip flexion with 5#, then PT bracing right iwht hip flexion, did some right hip abduction 45# leg curls 20# right only 10# left leg extension Feet on ball rotation, bridge, isometric abs Passive stretch right knee into extension Black tband clamshells  10/31/23 15# rows 3x15 13# straight arm pulls 3x15 15# biceps 20# overhead triceps press Leg curls 45# 2x15, then right only 25# 10# leg extension left only PWR moves in sitting for trunk mobility 3# UE circuit 3# trunk rotation with reaching Passive right knee extension Feet on ball K2C, rotation, bridges Partial sit ups Black tband clamshells STM to the left hip with Tgun  10/24/23 15# rows  15# straight arm pulls 15# biceps 20#  overhead tricep press 45# leg curls  then right only 25# 10# leg extension left On mat partial sit ups with 6# held at chest Elbow touches to mat each side Seated reach to cone and then extend back up PWR move Feet on ball K2C, rotation, bridge isometric abs Black tband clamshells Ball squeeze with bridge Passive stretch right knee extension Left side lying right hip abduction SLR on the right  10/10/23 15# biceps 3x15 15# chest press 3x15 15# straight arms  15# rows 20# triceps press overhead Discussion about his HEP with a 5# weight at home, trying to have him not do much overhead stuff 45# leg curls , right only 25# 2x15 each 5# standing marches with PT max A to keep leg bearing weight straight Patient wanted to weight so with a lot of effort got him on scale weight was 178# Passive stretch of the left knee to prevent contracture Feet on ball bridges Partial sit ups   10/06/23 Rows 15lb x15, x20 Shoulder Ext 15lb 2x20 Curls 10lb x20, x15 Chest press 15lb 2x20 Leg press 50lb x 15, 40lb 2x15 At Mat Seated Crunches 2x15 Seated Trunk rotations with weighted ball Seated OHP blue ball 2x15 Horiz abd blue 2x10 Hip and black 2x15  09/26/23 At Mat Seated Crunches 2x15 Seated Trunk rotations with weighted  ball Fitter press all bands w/ LLE 2x15  1black 1 blue band RLE 2x15 Military press 3lb 2x15  HS curls black 2x15   Rows black band 2x10  Horizontal abduction blue   Ankle R blue 2x15 15lb shoulder Ext 2x15 Biceps curls 10lb 2x10 Triceps Ext 20lb 2x15   09/21/23 Nustep level 6 x 7 minutes with arms and then 4 minutes without arms Rows 15lb 2x15 Extension 15lb 2x15 15# biceps 20# triceps Chest press 15lb HS curls 55lb 2x15, RLE 20b 2x15 Leg Ext LLE 15lb 2x15 Fitter press RLE 1 blue 1black, LLE all bands   PATIENT EDUCATION:  Education details: POC Person educated: Patient Education method: Explanation Education comprehension: verbalized  understanding  HOME EXERCISE PROGRAM:  ASSESSMENT:  CLINICAL IMPRESSION: WE continue to work on his overall health and hopefully his safety with transfers.  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  HE is doing the Nustep at home he is still having some hip pain, he does report not taking my advice fully about taking days off.  He still is unsafe at time with the transfers especially when he is fatigued, he is still impulsive and goes too fast at times. The right leg has no quad tendon and the left leg has neuropathy so he is at a very high risk for falls and for regression as he could not exercise much on his own and his wife cannot help him   We have set up the maintenance program following medicare guidelines.  He qualifies for this due to the inability to be safe, has had multiple falls and is impulsive, tends to think he can do more than he can.  Patient has not had a fall in the past 5 weeks, he remains very unsafe at times and with any transfer needs min A or close CGA.  Tends to not understand that the right knee has no quad to hold the leg straight.  I  In the long run he is impulsive and unsafe and cannot exercise on his own to maintain his level, he is at a high risk for falls and there is no gym that has the ability to assist him. Session completed at mat table to eliminate trunk support for improved core stabilization. Core weakness with seated OHP.  Pt did report a fall yesterday stated he was standing pulling his pants up, I advised pt that he shouldn't put weight in his RLE, He ws adamant that he could put a little on it.     Justifying Skilled Maintenance for Rehab Services:   The goal of a skilled maintenance therapy program is to maintain the patient's current functional status or to  prevent or slow deterioration. If a patient who is receiving restorative therapy then requires skilled maintenance therapy based on the skills and judgment of the therapist, development of a maintenance program would occur during the last visit for restorative treatment.    Does the patient need treatment that necessitates skilled services that can only be provided by a skilled provider? Can a caregiver provide these services why or why not? 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  He is impulsive and at times very unsafe he could not do this on his own and his wife could not help him., history of COPD, CKD, depression, HTN, kyphosis, a-fib, PVD, spinal stenosis, and stroke as well as RC repair.  These limit the patient's independent carryover with PT  exercises and require max cueing for optimal performance of safety and to strengthen other mms for his future in therapy sessions.    The patient requires the skill of PT to help with ongoing exercise and mobility to prevent rapid and significant decline in functional mobility, strength, and to limit falls with his safety.  Therefore, continued skilled maintenance therapy is recommended for this patient.    OBJECTIVE IMPAIRMENTS: Abnormal gait, cardiopulmonary status limiting activity, decreased activity tolerance, decreased balance, decreased coordination, decreased endurance, decreased mobility, difficulty walking, decreased ROM, decreased strength, improper body mechanics, postural dysfunction, and poor safety.   REHAB POTENTIAL: Fair dependent on carry over and patient compliance  CLINICAL DECISION MAKING: Evolving/moderate complexity  EVALUATION COMPLEXITY: Moderate  GOALS: Goals for skilled maintenance:     Long Term Goals: Target Date 12/13/23      The patient will preserve functional mobility within the home environment. Baseline: uses a w/c            Goal Status: Progressing 11/14/23   2. The patient will maintain strength for transfers.            Baseline: able to perform with set up and at times requires mod A and at other time may need Max A due to poor safety and impulsiveness            Goal Status: progressing 11/14/23   3. The patient will minimize fall frequency.            Baseline: he has been having about a fall a month            Goal Status: ongoing 11/14/23   4. Safe with HEP at home and with equipment he has            Baseline: does HEP             Goal Status: progressing 11/14/23   PLAN:  PT FREQUENCY: 1x/week  PT DURATION: 12 weeks  PLANNED INTERVENTIONS: Therapeutic exercises, Therapeutic activity, Neuromuscular re-education, Balance training, Gait training, Patient/Family education, Self Care, Joint mobilization, Stair training, Cryotherapy, Moist heat, and Manual therapy.  PLAN FOR NEXT SESSION: Have sat up the maintenance program for Mr. Cruickshank and progressing with this, biggest issue he is unsafe and cannot do on his own or with his wife.  Trying to maintain his level of independence of living at home with wife and safety  OBADIAH OZELL ORN, PT

## 2023-11-16 NOTE — Addendum Note (Signed)
 Addended by: Cierah Crader G on: 11/16/2023 04:24 PM   Modules accepted: Orders

## 2023-11-17 ENCOUNTER — Other Ambulatory Visit (INDEPENDENT_AMBULATORY_CARE_PROVIDER_SITE_OTHER)

## 2023-11-17 DIAGNOSIS — R7303 Prediabetes: Secondary | ICD-10-CM | POA: Diagnosis not present

## 2023-11-17 DIAGNOSIS — I10 Essential (primary) hypertension: Secondary | ICD-10-CM

## 2023-11-17 DIAGNOSIS — R531 Weakness: Secondary | ICD-10-CM | POA: Diagnosis not present

## 2023-11-17 LAB — CBC WITH DIFFERENTIAL/PLATELET
Basophils Absolute: 0.1 K/uL (ref 0.0–0.1)
Basophils Relative: 0.9 % (ref 0.0–3.0)
Eosinophils Absolute: 0.9 K/uL — ABNORMAL HIGH (ref 0.0–0.7)
Eosinophils Relative: 12.6 % — ABNORMAL HIGH (ref 0.0–5.0)
HCT: 47 % (ref 39.0–52.0)
Hemoglobin: 15.8 g/dL (ref 13.0–17.0)
Lymphocytes Relative: 16.1 % (ref 12.0–46.0)
Lymphs Abs: 1.2 K/uL (ref 0.7–4.0)
MCHC: 33.5 g/dL (ref 30.0–36.0)
MCV: 90.9 fl (ref 78.0–100.0)
Monocytes Absolute: 1.1 K/uL — ABNORMAL HIGH (ref 0.1–1.0)
Monocytes Relative: 14.1 % — ABNORMAL HIGH (ref 3.0–12.0)
Neutro Abs: 4.2 K/uL (ref 1.4–7.7)
Neutrophils Relative %: 56.3 % (ref 43.0–77.0)
Platelets: 249 K/uL (ref 150.0–400.0)
RBC: 5.17 Mil/uL (ref 4.22–5.81)
RDW: 13 % (ref 11.5–15.5)
WBC: 7.5 K/uL (ref 4.0–10.5)

## 2023-11-17 LAB — COMPREHENSIVE METABOLIC PANEL WITH GFR
ALT: 14 U/L (ref 0–53)
AST: 19 U/L (ref 0–37)
Albumin: 4.1 g/dL (ref 3.5–5.2)
Alkaline Phosphatase: 92 U/L (ref 39–117)
BUN: 28 mg/dL — ABNORMAL HIGH (ref 6–23)
CO2: 24 meq/L (ref 19–32)
Calcium: 9 mg/dL (ref 8.4–10.5)
Chloride: 103 meq/L (ref 96–112)
Creatinine, Ser: 1.08 mg/dL (ref 0.40–1.50)
GFR: 63.05 mL/min (ref >60.00)
Glucose, Bld: 81 mg/dL (ref 70–99)
Potassium: 4 meq/L (ref 3.5–5.1)
Sodium: 137 meq/L (ref 135–145)
Total Bilirubin: 0.5 mg/dL (ref 0.2–1.2)
Total Protein: 6.9 g/dL (ref 6.0–8.3)

## 2023-11-17 LAB — HEMOGLOBIN A1C: Hgb A1c MFr Bld: 5.9 % (ref 4.6–6.5)

## 2023-11-18 ENCOUNTER — Ambulatory Visit: Payer: Self-pay | Admitting: Internal Medicine

## 2023-11-20 ENCOUNTER — Encounter

## 2023-11-20 ENCOUNTER — Other Ambulatory Visit: Payer: Self-pay | Admitting: Internal Medicine

## 2023-11-20 DIAGNOSIS — I480 Paroxysmal atrial fibrillation: Secondary | ICD-10-CM

## 2023-11-20 NOTE — Telephone Encounter (Unsigned)
 Copied from CRM #[id number]. Topic: Clinical - Medication Refill >> Nov 20, 2023  1:51 PM [place] E wrote: Medication: flecainide  (TAMBOCOR ) 100 MG tablet  Has the patient contacted their pharmacy? No (Agent: If no, request that the patient contact the pharmacy for the refill. If patient does not wish to contact the pharmacy document the reason why and proceed with request.) (Agent: If yes, when and what did the pharmacy advise?)  This is the patient's preferred pharmacy:   [facility] [id number], [place] - [address] [place] [address] Phone: [phone] Fax: [phone]   Is this the correct pharmacy for this prescription? Yes If no, delete pharmacy and type the correct one.   Has the prescription been filled recently? No  Is the patient out of the medication? No, 1 week left.  Has the patient been seen for an appointment in the last year OR does the patient have an upcoming appointment? Yes  Can we respond through MyChart? Yes  Agent: Please be advised that Rx refills may take up to 3 business days. We ask that you follow-up with your pharmacy.

## 2023-11-21 ENCOUNTER — Encounter: Payer: Self-pay | Admitting: Internal Medicine

## 2023-11-21 NOTE — Progress Notes (Unsigned)
 PATIENT: Brandon Mendoza. DOB: Jan 29, 1941  REASON FOR VISIT: follow up HISTORY FROM: patient  Virtual Visit via MyChart video  I connected with Brandon Mendoza. on 11/23/23 at  9:00 AM EDT via MyChart video and verified that I am speaking with the correct person using two identifiers.   I discussed the limitations, risks, security and privacy concerns of performing an evaluation and management service by Mychart video and the availability of in person appointments. I also discussed with the patient that there may be a patient responsible charge related to this service. The patient expressed understanding and agreed to proceed.   History of Present Illness:  11/23/23 ALL: [name] returns for initial CPAP follow up following set up of new machine. 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 There were many desaturation events but none was lasting a clinically significant time. Central apneas were 16% of the total apnea count. New AutoPAP ordered. Since, he reports doing well. He is sleeping well with new machine. He tells me that the tubing is different but not overly bothered by this. He is using therapy nightly for about 6 hours, on average.   Clemens 06/2022 and tore quad tendon. Has motorized wheelchair. He continues PT. Neuropathy is stable. He continues to exercise at home. Continue to see Dr Jesus. Continues asa and rosuvastatin .    07/06/2023 ALL:  Brandon Mendoza. is a 83 y.o. male here today for follow up. He was last seen by Dr Chalice 06/2021 and referred to Dr Onita 11/2021 for imbalance, lower ext weakness and pain. 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 PT and follow up with PCP advised.   Since, he reports doing fairly well. Unfortunately he had a fall last year and ruptured his right quadriceps tendon. He had surgical repair that failed and returned for revision. He fell again and ruptured same  tendon. He is being referred to ortho specialist for consult for reconstruction. He continues to work with outpatient PT twice weekly. Mostly wheelchair bound but able to transfer independently. He does have posterior walker that he can use occasionally.   He continues CPAP therapy nightly. Average usage about 5-6 hours. AHI well managed. He does usually sleep another 1-2 hours after waking to use restroom and does not restart therapy. He wakes feeling refreshed. Last machine set up early 2020.     12/10/2021 YY: Brandon Mendoza, is a 83 year old male, seen in request by his primary care physician Dr. Jesus, [place] for slow worsening gait abnormality, he is a patient of Dr. Chalice for obstructive sleep apnea   I reviewed and summarized the referring note. PMHX. HTN HLD Stroke COPD, quit smoke in 2006, was smoking 1ppd OSA Stroke in 2013, s/p left carotid artery endarterectomy Lumbar decompression surgery by Dr. Bobie in Sept 2022, L4-5.   He used to be very active, played racquetball regularly on 05/18/2019, at that time he noticed increased low back pain, balance issues, underwent L4-5 decompression surgery by orthopedic surgeon Dr. Bobie in September 2022   Surgery did help his low back pain, radiating pain to bilateral lower extremity, but he continued to have lower extremity weakness, gait abnormality, which did not change much   He now denies significant neck pain, low back pain, denies bowel and bladder incontinence, recently started physical therapy, reported some improvement   He had gradual onset bilateral hammertoes for many years, seen by foot doctor for  many years, because he has noticeable hammertoes, complains of lower extremity paresthesia, he was told possibility of Charcot-Marie-Tooth disease, he denies family history of neuropathy,   EMG nerve conduction study from [facility] on September 02, 2021 by Dr. Albina, only bilateral lower extremity motor examination was performed,  evidence of bilateral peroneal, tibial motor prolonged distal latency decreased CMAP amplitude, slow conduction velocity. No EMG was performed, based on the test, Dr. 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   Personally reviewed pre surgical MRI lumbar from February 2022, L4-5 grade 1 anterolisthesis, with severe spinal stenosis moderate to severe left foraminal narrowing, facet arthropathy throughout the lumbar spine   Post L4-5 decompression MRI of lumbar spine from September 2023, posterior pedicle screw and rod fixation L4-5 without residual stenosis, progressive adjacent level disease at L3-4 with moderate right and mild left subarticular narrowing and moderate foraminal stenosis, progressive moderate facet hypertrophy at L1-2 with progressive whiteboard disc protrusion with moderate right foraminal stenosis   MRI of the brain with without contrast November 26, 2021, generalized atrophy moderate small vessel disease no acute abnormality, remote lacunar infarction of left corona radiata, left caudate head   MRI cervical spine, mild degenerative changes no significant canal or foraminal narrowing  07/22/2021 CD: Brandon Mendoza is a 83 y.o. male patient , originally referred by Dr Rosemarie 2014 after a stroke and followed in the sleep clinic for OSA on CPAP. Followed also by Greig Forbes, NP. He has his primary care in [facility]. He has changed his cardio care to Dr Ladona COME. Interval Visit and history : 07-22-2021: The patient is worried about his neuropathy, and he has undergone some UV light infrared light stimulation therapy. PT is working with him. He has right sided weakness, high arches and hammertoes, question of Charcot marie tooth.  He had nerve tests with Dr Leopoldo.  Dr. Leopoldo did nerve conduction and EMG testing, he also underwent spinal tap with CSF testing and nothing showed up abnormal Dr.  Leopoldo did state that the patient had neuropathy but he could not identify a cause the patient is not diabetic, he has ptosis. Was checked for myasthenia gravis- negative. He cant stand on one leg, no tandem gait ability, and he falls frequently. I have no NCV capacity in our office for now- that's what is needed.     Dr Ladona has recently seen him, he Dr. Ladona wanted me to reevaluate this patient for muscle disorder but I do not have the capacity to do these tests here.  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  He had no further episodes of atrial fibrillation on the loop recorder therefore the anticoagulation could be discontinued I am glad of that.  The question of Charcot-Marie-Tooth syndrome is justified however I have no ability to provide a nerve conduction with EMG at this time I will ask my colleague Dr. Onita to or Dr. Darleen to recommend where this test would best be done.     In addition the patient had just been seen on 4-24 by Dr. Ladona and he had followed with Dr. Medford Fireman.    I treated him in the sleep clinic for OSA and he is on CPAP and compliant.  100% compliance for the last 30 days AutoSet is used between 5 and 10 cm water   pressure with 2 cm expiratory pressure relief.  Residual AHI for this patient is 1.7 which is excellent.  Set up for this machine was 03-2018.  08/18/20 ALL: Mr Lenhoff returns for annual follow up for OSA on CPAP therapy. He is doing very well on CPAP. He is using CPAP nightly. He does continue to wake multiple times at night to urinate. He does not sleep much longer than 5-6 hours. He denies concerns with CPAP or supplies.   He is concerned about multiple falls over the past year. At last follow up with me, an irregular heart rate was noted. He had follow up with PCP and referred to cardiology for atrial fibrillation. He was started on  flecainide  and Eliquis. Plavix  discontinued (prior CVA in 2002). Since, he reports that with prolonged activity like walking or mowing the lawn, he has bilateral leg weakness and will often fall. He feels that his legs just give out and he collapses. He was an avid racquet ball player and walked several miles a day prior to diagnosis of afib. He has been seen by Dr Leopoldo, neurology, who diagnosed idiopathic axonal neuropathy. He was referred to Dr Cornelius for placement of Vestiflex due to significant lumbar spine disease. Pain has improved but balance difficulty remains. He has completed PT with little improvement. He does have intermittent dizziness while standing but does not feel this occurs with leg weakness and he does not fall when dizzy. No lightheadedness, palpitations, or chest pain. He has decided to wean Xanax  0.5mg  taken daily for anxiety to see if this helps. He was offered a trial of Sinemet and/or lumbar puncture and nystagmogram. Myasthenia labs were negative. He is frustrated, today, stating that he does not have a primary care provider. Dr Elihue retired last year and Dr Arnell is retiring this month. He request a referral to local PCP.      08/05/2019 ALL:  TRAYVOND VIETS is a 83 y.o. male here today for follow up of OSA on CPAP therapy.  He reports he is doing very well.  He denies any concerns with his CPAP machine or supplies.  He is using CPAP nearly every night.  Occasionally he will miss a night if he goes hunting.  He definitely feels that CPAP has helped with improved sleep quality and increased energy levels.  He continues to play racquetball.  He was recently seen by primary care.  He did report some dizziness and feeling off balance for the past few months.  He does note that this seems to be worse when he is playing racquetball.  He is also noticed feeling off balance when walking in the woods.  He denies any chest pain, trouble breathing or palpitations.  He does not have a  history of A. fib that he is aware of.  He is on Plavix  status post CVA in 2013.  Compliance report dated 07/12/2019 through 08/10/2019 reveals that he used CPAP 28 of the past 30 days for compliance of 93%.  He used CPAP greater than 4 hours 28 of the past 30 days for compliance of 93%.  Average usage was 5 hours and 57 minutes.  Residual AHI was 1.6 on 5 to 10 cm of water  and an EPR of 2.  There was no significant leak noted.   Observations/Objective:  Generalized: Well developed, in no acute distress  Mentation: Alert oriented to time, place, history taking. Follows all commands speech and language fluent   Assessment and Plan:  83 y.o.  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 here with    ICD-10-CM   1. OSA on CPAP  G47.33 For home use only DME continuous positive airway pressure (CPAP)      Mr Freimark is doing well, overall. Compliance report reveals excellent compliance. Supply orders updated. He will continue therapy nightly for at least 4 hours. He will continue close follow up with care team. Commended for continuing PT and HEP. He will follow up with me in 1 year.   Orders Placed This Encounter  Procedures   For home use only DME continuous positive airway pressure (CPAP)    Heated Humidity with all supplies as needed    Length of Need:   Lifetime    Patient has OSA or probable OSA:   Yes    Is the patient  currently using CPAP in the home:   Yes    Settings:   Other see comments    CPAP supplies needed:   Mask, headgear, cushions, filters, heated tubing and water  chamber    No orders of the defined types were placed in this encounter.    Follow Up Instructions:  I discussed the assessment and treatment plan with the patient. The patient was provided an opportunity to ask questions and all were answered. The patient agreed with the plan and demonstrated an understanding of the instructions.   The patient was advised to call back or seek an in-person evaluation if the symptoms worsen or if the condition fails to improve as anticipated.  I provided 30 minutes of face-to-face and non face-to-face time during this MyChart video encounter. Patient located at their place of residence. Provider is in the office.    Lenell Lama, NP

## 2023-11-21 NOTE — Patient Instructions (Signed)

## 2023-11-22 ENCOUNTER — Telehealth: Payer: Self-pay | Admitting: Cardiology

## 2023-11-22 ENCOUNTER — Ambulatory Visit: Admitting: Physical Therapy

## 2023-11-22 ENCOUNTER — Encounter: Payer: Self-pay | Admitting: Physical Therapy

## 2023-11-22 DIAGNOSIS — R2689 Other abnormalities of gait and mobility: Secondary | ICD-10-CM | POA: Diagnosis not present

## 2023-11-22 DIAGNOSIS — M25561 Pain in right knee: Secondary | ICD-10-CM

## 2023-11-22 DIAGNOSIS — M6281 Muscle weakness (generalized): Secondary | ICD-10-CM

## 2023-11-22 DIAGNOSIS — R296 Repeated falls: Secondary | ICD-10-CM

## 2023-11-22 DIAGNOSIS — I480 Paroxysmal atrial fibrillation: Secondary | ICD-10-CM

## 2023-11-22 DIAGNOSIS — M6249 Contracture of muscle, multiple sites: Secondary | ICD-10-CM

## 2023-11-22 DIAGNOSIS — M5459 Other low back pain: Secondary | ICD-10-CM

## 2023-11-22 MED ORDER — FLECAINIDE ACETATE 100 MG PO TABS: 100.0000 mg | ORAL_TABLET | Freq: Two times a day (BID) | ORAL | 0 refills | Status: DC

## 2023-11-22 NOTE — Telephone Encounter (Signed)
 Pt's medication was sent to pt's pharmacy as requested. Confirmation received.

## 2023-11-22 NOTE — Progress Notes (Unsigned)
 SABRA

## 2023-11-22 NOTE — Telephone Encounter (Signed)
*  STAT* If patient is at the pharmacy, call can be transferred to refill team.   1. Which medications need to be refilled? (please list name of each medication and dose if known)   flecainide  (TAMBOCOR ) 100 MG tablet   2. Would you like to learn more about the convenience, safety, & potential cost savings by using the [facility]?   3. Are you open to using the [facility] (Type [facility]. ).  4. Which pharmacy/location (including street and city if local pharmacy) is medication to be sent to?  [facility] [id number] - [place], [place] - [address]   5. Do they need a 30 day or 90 day supply? 90 day  Patient stated he still has some medication.

## 2023-11-22 NOTE — Therapy (Signed)
 OUTPATIENT PHYSICAL THERAPY TREATMENT    Patient Name: Brandon Mendoza. MRN: [id number] DOB:06/28/1940, 83 y.o., male Today's Date: 11/22/2023  END OF SESSION:  PT End of Session - 11/22/23 0849     Visit Number 44    Date for PT Re-Evaluation 12/15/23    Authorization Type Medicare    PT Start Time 0843    PT Stop Time 0927    PT Time Calculation (min) 44 min    Activity Tolerance Patient tolerated treatment well    Behavior During Therapy [facility] for tasks assessed/performed;Impulsive          Past Medical History:  Diagnosis Date   Acquired clawfoot, right foot 12/16/2021   Anxiety    Atrophy of calf muscles 10/21/2021   Duplex calves was negative for significant blockage EMG was done at [facility] MRI lumbar spine was also done at [facility]   Carotid artery occlusion    CKD (chronic kidney disease)    Stage 3   COPD (chronic obstructive pulmonary disease) (HCC)    Depression    Dysrhythmia    PAF, atrial tachycardia   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   The spinal cord appears normal. 2.   No spinal stenosis. 3.   Mild multilevel degenerative changes as detailed above that do not lead to spinal stenosis or nerve root compression. 4.   T2 hyperintense foci within the pons consistent with chronic microvascular ischemic changes.   Loop - Medtronic Linq 10/22/2020 10/22/2020   Nocturia    Paroxysmal atrial fibrillation (HCC)    Peripheral neuropathy    Peripheral vascular disease (HCC)    Sleep apnea    on 6 cm, nasal pillow   Spinal stenosis    Stroke (HCC) 05/01/2011   ischemic   Weakness generalized 10/21/2021   Severe, legs and arms   Past Surgical History:  Procedure Laterality Date   CAROTID ENDARTERECTOMY  12/09/11   Left cea    ENDARTERECTOMY  12/09/2011   Procedure: ENDARTERECTOMY CAROTID;  Surgeon: Krystal JULIANNA Doing, MD;  Location: [facility] OR;  Service: Vascular;  Laterality: Left;   EYE SURGERY     rt retina detachment   HAMMER TOE SURGERY  2004   HERNIA REPAIR  2006   I & D EXTREMITY Right 10/07/2022   Procedure: IRRIGATION AND DEBRIDEMENT EXTREMITY WITH WOUND CLOSURE;  Surgeon: Burnetta Aures, MD;  Location: [facility] ORS;  Service: Orthopedics;  Laterality: Right;   QUADRICEPS TENDON REPAIR Right 07/12/2022   Procedure: REPAIR QUADRICEP TENDON;  Surgeon: Ernie Cough, MD;  Location: [facility];  Service: Orthopedics;  Laterality: Right;   QUADRICEPS TENDON REPAIR Right 08/30/2022   Procedure: REPAIR QUADRICEP TENDON;  Surgeon: Ernie Cough, MD;  Location: [facility];  Service: Orthopedics;  Laterality: Right;   SHOULDER ARTHROSCOPY W/ ROTATOR CUFF REPAIR  2010   TONSILLECTOMY     Patient Active Problem List   Diagnosis Date Noted   Coronary artery calcification 05/09/2023   Rupture of quadriceps tendon 11/03/2022   Wound dehiscence 10/07/2022   Quadriceps tendon rupture, right, subsequent encounter 08/30/2022   Quadriceps tendon rupture, right, initial encounter 07/12/2022   Unable to care for self 07/08/2022   Dehydration 07/08/2022  Depression 07/08/2022   COPD (chronic obstructive pulmonary disease) (HCC) 07/08/2022   Sialoadenitis of submandibular gland 04/28/2022   Macular degeneration 03/01/2022   Myofascial pain 02/10/2022   Lumbar spondylosis 01/03/2022   Acquired clawfoot, left foot 12/16/2021   Acquired clawfoot, right foot 12/16/2021   Claw foot 12/14/2021   Gait abnormality 12/10/2021   Spinal stenosis at L4-L5 level 12/10/2021   Ataxia 11/22/2021   Lightheadedness 11/02/2021   Claudication of lower extremity (HCC) 10/21/2021   Atrophy of calf muscles 10/21/2021   Weakness generalized 10/21/2021   Focal neurological deficit 10/21/2021   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    Pain in joint of right hip 09/14/2021   High arches 07/22/2021   Hammertoes of both feet 07/22/2021   Neuropathy 07/22/2021   Constipation 05/28/2021   Disequilibrium 05/28/2021   History of colonic polyps 05/28/2021   Renal cyst 05/28/2021   Gastroesophageal reflux disease without esophagitis 05/05/2021   Ingrowing left great toenail 02/15/2021   S/P lumbar fusion 12/24/2020   Spinal stenosis of lumbar region 12/04/2020   Acquired complex renal cyst 11/23/2020   Functional gait abnormality 11/04/2020   Idiopathic neuropathy 11/02/2020   Tinea pedis of both feet 10/28/2020   Loop - Medtronic Linq 10/22/2020 10/22/2020   Benign hypertension with 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) 10/05/2020   Fall with injury 10/05/2020   Hematoma 09/16/2020   Normocytic anemia 09/14/2020   Leukocytosis 09/14/2020   AF (paroxysmal atrial fibrillation) (HCC) 09/14/2020   Chronic anticoagulation 09/14/2020   Hyponatremia 09/14/2020   Pain in left foot 01/22/2020   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   Pain in joint of right knee 12/27/2019   Recurrent falls 11/17/2019   Paroxysmal atrial fibrillation (HCC) 10/22/2019   Pain in right foot 10/14/2019   Falls 10/07/2019   Ataxia involving legs 08/12/2019   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   Urge incontinence 04/13/2018   Urinary urgency 04/13/2018   Pain in left knee 03/01/2018   Other intervertebral disc degeneration, lumbar region 06/16/2017   Acquired bilateral hammer toes 10/05/2015   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   Generalized anxiety disorder 05/12/2015   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   History of left-sided carotid endarterectomy 04/10/2014   Essential hypertension 04/10/2014   Occlusion and stenosis of carotid artery without mention of cerebral infarction 12/15/2011   Habitual alcohol use  12/01/2011   Carotid artery stenosis, symptomatic 11/30/2011   Cerebral infarction (HCC) 11/29/2011   Mixed hyperlipidemia     PCP: Bernardino Cone  REFERRING PROVIDER: Bernardino Cone  REFERRING DIAG: weakness, multiple falls  Rationale for Evaluation and Treatment: Rehabilitation  THERAPY DIAG:  Other abnormalities of gait and mobility  Repeated falls  Muscle weakness (generalized)  Contracture of muscle, multiple sites  Acute pain of right knee  Other low back pain  ONSET DATE: 10/07/22  SUBJECTIVE:  SUBJECTIVE STATEMENT: No falls, doing okay  Patient asking about plan, he reports that he is worried that he will not be able to stay independent and live at home if he stops, he has no equipment and with the right knee's inability to extend and the left knee having issues with buckling due to neuropathy, he is worried about more falls and the strain on his wife  Doing all right, had one fall, fell in the shower yesterday. Did exercises before the showers, legs gave out  on him  PERTINENT HISTORY:  Cerebral infarct 10/21/21, lumbar fusion, recurrent falls, has significant neuropathy and this is the reason for most of his falls   PAIN:  Are you having pain? Yes: NPRS scale: 0/10 Pain location: right knee Pain description: ache  Aggravating factors: being on my feet  Relieving factors: movement  PRECAUTIONS: Fall  WEIGHT BEARING RESTRICTIONS: No  FALLS:  Has patient fallen in last 6 months? Yes. Number of falls every few days   LIVING ENVIRONMENT: Lives with: lives with their spouse Lives in: House/apartment Stairs: Yes: Internal: 20 steps; on right going up Has following equipment at home: [name] - 4 wheeled, Wheelchair (manual), shower chair, Tour manager, Grab bars, Ramped entry,  and trekking pole  OCCUPATION: Retired  PLOF: Independent, prior to a year ago he was driving used trekking poles to walk  PATIENT GOALS: live at home  NEXT MD VISIT:   OBJECTIVE:   DIAGNOSTIC FINDINGS:    LUMBAR SPINE FINDINGS: 1. Wide laminectomy and posterior pedicle screw and rod fixation at L4-5 without residual or recurrent stenosis. 2. Progressive adjacent level disease at L3-4 with moderate right and mild left subarticular narrowing and moderate foraminal stenosis bilaterally. Progression is predominantly due to facet disease 3. Progressive moderate facet hypertrophy at L1-2 with a progressive rightward disc protrusion resulting in progressive moderate right foraminal stenosis. Asymmetric endplate marrow edema on the right at L1-2 is consistent with progressive disease as well. 4. Otherwise stable degenerative change without other focal stenosis.   THORACIC SPINE FINDINGS:  On sagittal views the vertebral bodies have normal height and alignment.  Hemangioma within T9 vertebral body and slightly within T10 vertebral body.  Anterior kyphosis.  Mild scoliosis convex right centered at T8 level. The spinal cord is normal in size and appearance. The paraspinal soft tissues are unremarkable.     On axial views there is no spinal stenosis or foraminal narrowing.  Limited views of the aorta, kidneys, liver, lungs and paraspinal muscles are notable for multiple large right renal cysts ranging in size from 1.5 to 7 cm.   COGNITION: Overall cognitive status: Within functional limits for tasks assessed     SENSATION: WFL  MUSCLE LENGTH: Hamstrings: mod tightness on both sides  POSTURE: rounded shoulders and forward head  LOWER EXTREMITY ROM:   the right knee has HS, has no quad tendon on the right, unable to lift the leg, left leg WFL's , left hip and right hip and ankles WFL's   LOWER EXTREMITY MMT: 4-/5 for the LE's except for the right knee extension UPPER EXTREMITY  MMT:  4-/5 for shoulders and elbows   FUNCTIONAL TESTS:  5XSTS: 83 seconds really has to use his hands and is unsteady with standing Cannot stand on his own without holding onto something due to poor balance   Transfers:  needs set up and MinA for safety, is impulsive and unsafe at times. GAIT: He is w/c dependent    TODAY'S TREATMENT:  DATE:  11/22/23 Pbars standing and marching with PT blocking knees Leg curls 45# 2x15, then right only 20# 2x15 Leg extension 10# left only 2 x 15 15# rows on pulleys 15# straight arm pulls 20# biceps 20# overhead triceps press 10# chest press Feet on ball K2C, rotation, bridge, isometric abs Passive right knee extension Black tband clamshells Ball b/n knees squeeze with bridge  11/14/23 Standing in the Pbars marching with PT blocking knees, toe raises 45# HS curls, then right only 20# 10# left only knee extension 15# rows 15# straight arm pulls Feet on ball K2C, rotation, bridges and isometric abs PAssive right knee extension, left piriformis stretch Black tband clamshells Ball b/n knees squeeze with bridge Asked him to give himself time after the nustep for his legs to recover before transfers  11/08/23 15# rows 15# straight arm pulls 10# chest press 20# triceps over head 15# biceps Standing in pbars, left hip flexion with 5#, then PT bracing right iwht hip flexion, did some right hip abduction 45# leg curls 20# right only 10# left leg extension Feet on ball rotation, bridge, isometric abs Passive stretch right knee into extension Black tband clamshells  10/31/23 15# rows 3x15 13# straight arm pulls 3x15 15# biceps 20# overhead triceps press Leg curls 45# 2x15, then right only 25# 10# leg extension left only PWR moves in sitting for trunk mobility 3# UE circuit 3# trunk rotation with reaching Passive  right knee extension Feet on ball K2C, rotation, bridges Partial sit ups Black tband clamshells STM to the left hip with Tgun  10/24/23 15# rows  15# straight arm pulls 15# biceps 20# overhead tricep press 45# leg curls  then right only 25# 10# leg extension left On mat partial sit ups with 6# held at chest Elbow touches to mat each side Seated reach to cone and then extend back up PWR move Feet on ball K2C, rotation, bridge isometric abs Black tband clamshells Ball squeeze with bridge Passive stretch right knee extension Left side lying right hip abduction SLR on the right  10/10/23 15# biceps 3x15 15# chest press 3x15 15# straight arms  15# rows 20# triceps press overhead Discussion about his HEP with a 5# weight at home, trying to have him not do much overhead stuff 45# leg curls , right only 25# 2x15 each 5# standing marches with PT max A to keep leg bearing weight straight Patient wanted to weight so with a lot of effort got him on scale weight was 178# Passive stretch of the left knee to prevent contracture Feet on ball bridges Partial sit ups   10/06/23 Rows 15lb x15, x20 Shoulder Ext 15lb 2x20 Curls 10lb x20, x15 Chest press 15lb 2x20 Leg press 50lb x 15, 40lb 2x15 At Mat Seated Crunches 2x15 Seated Trunk rotations with weighted ball Seated OHP blue ball 2x15 Horiz abd blue 2x10 Hip and black 2x15  09/26/23 At Mat Seated Crunches 2x15 Seated Trunk rotations with weighted ball Fitter press all bands w/ LLE 2x15  1black 1 blue band RLE 2x15 Military press 3lb 2x15  HS curls black 2x15   Rows black band 2x10  Horizontal abduction blue   Ankle R blue 2x15 15lb shoulder Ext 2x15 Biceps curls 10lb 2x10 Triceps Ext 20lb 2x15   09/21/23 Nustep level 6 x 7 minutes with arms and then 4 minutes without arms Rows 15lb 2x15 Extension 15lb 2x15 15# biceps 20# triceps Chest press 15lb HS curls 55lb 2x15, RLE 20b 2x15 Leg Ext LLE  15lb 2x15 Fitter press  RLE 1 blue 1black, LLE all bands   PATIENT EDUCATION:  Education details: POC Person educated: Patient Education method: Explanation Education comprehension: verbalized understanding  HOME EXERCISE PROGRAM:  ASSESSMENT:  CLINICAL IMPRESSION: WE continue to work on his overall health and hopefully his safety with transfers.    HE is doing the Nustep at home he is still having some hip pain, he does report not taking my advice fully about taking days off.  He still is unsafe at time with the transfers especially when he is fatigued, he is still impulsive and goes too fast at times. The right leg has no quad tendon and the left leg has neuropathy so he is at a very high risk for falls and for regression as he could not exercise much on his own and his wife cannot help him with the standing I feel CGA and blocking the knees are needed due to at any time the knees can buckle, we have tried to teach him to do hip extension to keep the knee locked but he does not do this.  We have set up the maintenance program following medicare guidelines.  He qualifies for this due to the inability to be safe, has had multiple falls and is impulsive, tends to think he can do more than he can.  Patient has not had a fall in the past 5 weeks, he remains very unsafe at times and with any transfer needs min A or close CGA.  Tends to not understand that the right knee has no quad to hold the leg straight.  I  In the long run he is impulsive and unsafe and cannot exercise on his own to maintain his level, he is at a high risk for falls and there is no gym that has the ability to assist him. Session completed at mat table to eliminate trunk support for improved core stabilization. Core weakness with seated OHP.  Pt did report a fall yesterday stated he was standing pulling his pants up, I advised pt that he shouldn't put weight in his RLE, He ws adamant that he could put a little on it.     Justifying Skilled Maintenance for  Rehab Services:   The goal of a skilled maintenance therapy program is to maintain the patient's current functional status or to prevent or slow deterioration. If a patient who is receiving restorative therapy then requires skilled maintenance therapy based on the skills and judgment of the therapist, development of a maintenance program would occur during the last visit for restorative treatment.    Does the patient need treatment that necessitates skilled services that can only be provided by a skilled provider? Can a caregiver provide these services why or why not? 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  He is impulsive and at times very unsafe he could not do this on his own and his wife could not help him., history of COPD, CKD, depression, HTN, kyphosis, a-fib, PVD, spinal stenosis, and stroke as well as RC repair.  These limit the patient's independent carryover with PT  exercises and require max cueing for optimal performance of safety and to strengthen other mms for his future in therapy sessions.    The patient requires  the skill of PT to help with ongoing exercise and mobility to prevent rapid and significant decline in functional mobility, strength, and to limit falls with his safety.  Therefore, continued skilled maintenance therapy is recommended for this patient.    OBJECTIVE IMPAIRMENTS: Abnormal gait, cardiopulmonary status limiting activity, decreased activity tolerance, decreased balance, decreased coordination, decreased endurance, decreased mobility, difficulty walking, decreased ROM, decreased strength, improper body mechanics, postural dysfunction, and poor safety.   REHAB POTENTIAL: Fair dependent on carry over and patient compliance  CLINICAL DECISION MAKING: Evolving/moderate  complexity  EVALUATION COMPLEXITY: Moderate  GOALS: Goals for skilled maintenance:     Long Term Goals: Target Date 12/13/23     The patient will preserve functional mobility within the home environment. Baseline: uses a w/c            Goal Status: Progressing 11/14/23   2. The patient will maintain strength for transfers.            Baseline: able to perform with set up and at times requires mod A and at other time may need Max A due to poor safety and impulsiveness            Goal Status: progressing 11/14/23   3. The patient will minimize fall frequency.            Baseline: he has been having about a fall a month            Goal Status: ongoing 11/14/23   4. Safe with HEP at home and with equipment he has            Baseline: does HEP             Goal Status: progressing 11/14/23   PLAN:  PT FREQUENCY: 1x/week  PT DURATION: 12 weeks  PLANNED INTERVENTIONS: Therapeutic exercises, Therapeutic activity, Neuromuscular re-education, Balance training, Gait training, Patient/Family education, Self Care, Joint mobilization, Stair training, Cryotherapy, Moist heat, and Manual therapy.  PLAN FOR NEXT SESSION: Have sat up the maintenance program for Mr. Chauvin and progressing with this, biggest issue he is unsafe and cannot do on his own or with his wife.  Trying to maintain his level of independence of living at home with wife and safety  OBADIAH OZELL ORN, PT

## 2023-11-23 ENCOUNTER — Telehealth (INDEPENDENT_AMBULATORY_CARE_PROVIDER_SITE_OTHER): Admitting: Family Medicine

## 2023-11-23 ENCOUNTER — Encounter: Payer: Self-pay | Admitting: Family Medicine

## 2023-11-23 DIAGNOSIS — G4733 Obstructive sleep apnea (adult) (pediatric): Secondary | ICD-10-CM | POA: Diagnosis not present

## 2023-11-28 ENCOUNTER — Encounter: Payer: Self-pay | Admitting: Physical Therapy

## 2023-11-28 ENCOUNTER — Ambulatory Visit: Attending: Internal Medicine | Admitting: Physical Therapy

## 2023-11-28 DIAGNOSIS — R2689 Other abnormalities of gait and mobility: Secondary | ICD-10-CM | POA: Insufficient documentation

## 2023-11-28 DIAGNOSIS — R296 Repeated falls: Secondary | ICD-10-CM | POA: Diagnosis present

## 2023-11-28 DIAGNOSIS — M6249 Contracture of muscle, multiple sites: Secondary | ICD-10-CM | POA: Diagnosis present

## 2023-11-28 DIAGNOSIS — M5459 Other low back pain: Secondary | ICD-10-CM | POA: Insufficient documentation

## 2023-11-28 DIAGNOSIS — M6281 Muscle weakness (generalized): Secondary | ICD-10-CM | POA: Diagnosis present

## 2023-11-28 DIAGNOSIS — M25561 Pain in right knee: Secondary | ICD-10-CM | POA: Insufficient documentation

## 2023-11-28 NOTE — Therapy (Signed)
 OUTPATIENT PHYSICAL THERAPY TREATMENT    Patient Name: Brandon Mendoza. MRN: [id number] DOB:1940/12/23, 83 y.o., male Today's Date: 11/28/2023  END OF SESSION:  PT End of Session - 11/28/23 0850     Visit Number 45    Date for PT Re-Evaluation 12/15/23    Authorization Type Medicare    PT Start Time 0843    PT Stop Time 0928    PT Time Calculation (min) 45 min    Activity Tolerance Patient tolerated treatment well    Behavior During Therapy [facility] for tasks assessed/performed;Impulsive          Past Medical History:  Diagnosis Date   Acquired clawfoot, right foot 12/16/2021   Anxiety    Atrophy of calf muscles 10/21/2021   Duplex calves was negative for significant blockage EMG was done at [facility] MRI lumbar spine was also done at [facility]   Carotid artery occlusion    CKD (chronic kidney disease)    Stage 3   COPD (chronic obstructive pulmonary disease) (HCC)    Depression    Dysrhythmia    PAF, atrial tachycardia   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   The spinal cord appears normal. 2.   No spinal stenosis. 3.   Mild multilevel degenerative changes as detailed above that do not lead to spinal stenosis or nerve root compression. 4.   T2 hyperintense foci within the pons consistent with chronic microvascular ischemic changes.   Loop - Medtronic Linq 10/22/2020 10/22/2020   Nocturia    Paroxysmal atrial fibrillation (HCC)    Peripheral neuropathy    Peripheral vascular disease (HCC)    Sleep apnea    on 6 cm, nasal pillow   Spinal stenosis    Stroke (HCC) 05/01/2011   ischemic   Weakness generalized 10/21/2021   Severe, legs and arms   Past Surgical History:  Procedure Laterality Date   CAROTID ENDARTERECTOMY  12/09/11   Left cea    ENDARTERECTOMY  12/09/2011   Procedure: ENDARTERECTOMY CAROTID;  Surgeon: Krystal JULIANNA Doing, MD;  Location: [facility] OR;  Service: Vascular;  Laterality: Left;   EYE SURGERY     rt retina detachment   HAMMER TOE SURGERY  2004   HERNIA REPAIR  2006   I & D EXTREMITY Right 10/07/2022   Procedure: IRRIGATION AND DEBRIDEMENT EXTREMITY WITH WOUND CLOSURE;  Surgeon: Burnetta Aures, MD;  Location: [facility] ORS;  Service: Orthopedics;  Laterality: Right;   QUADRICEPS TENDON REPAIR Right 07/12/2022   Procedure: REPAIR QUADRICEP TENDON;  Surgeon: Ernie Cough, MD;  Location: [facility];  Service: Orthopedics;  Laterality: Right;   QUADRICEPS TENDON REPAIR Right 08/30/2022   Procedure: REPAIR QUADRICEP TENDON;  Surgeon: Ernie Cough, MD;  Location: [facility];  Service: Orthopedics;  Laterality: Right;   SHOULDER ARTHROSCOPY W/ ROTATOR CUFF REPAIR  2010   TONSILLECTOMY     Patient Active Problem List   Diagnosis Date Noted   Coronary artery calcification 05/09/2023   Rupture of quadriceps tendon 11/03/2022   Wound dehiscence 10/07/2022   Quadriceps tendon rupture, right, subsequent encounter 08/30/2022   Quadriceps tendon rupture, right, initial encounter 07/12/2022   Unable to care for self 07/08/2022   Dehydration 07/08/2022  Depression 07/08/2022   COPD (chronic obstructive pulmonary disease) (HCC) 07/08/2022   Sialoadenitis of submandibular gland 04/28/2022   Macular degeneration 03/01/2022   Myofascial pain 02/10/2022   Lumbar spondylosis 01/03/2022   Acquired clawfoot, left foot 12/16/2021   Acquired clawfoot, right foot 12/16/2021   Claw foot 12/14/2021   Gait abnormality 12/10/2021   Spinal stenosis at L4-L5 level 12/10/2021   Ataxia 11/22/2021   Lightheadedness 11/02/2021   Claudication of lower extremity (HCC) 10/21/2021   Atrophy of calf muscles 10/21/2021   Weakness generalized 10/21/2021   Focal neurological deficit 10/21/2021   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    Pain in joint of right hip 09/14/2021   High arches 07/22/2021   Hammertoes of both feet 07/22/2021   Neuropathy 07/22/2021   Constipation 05/28/2021   Disequilibrium 05/28/2021   History of colonic polyps 05/28/2021   Renal cyst 05/28/2021   Gastroesophageal reflux disease without esophagitis 05/05/2021   Ingrowing left great toenail 02/15/2021   S/P lumbar fusion 12/24/2020   Spinal stenosis of lumbar region 12/04/2020   Acquired complex renal cyst 11/23/2020   Functional gait abnormality 11/04/2020   Idiopathic neuropathy 11/02/2020   Tinea pedis of both feet 10/28/2020   Loop - Medtronic Linq 10/22/2020 10/22/2020   Benign hypertension with 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) 10/05/2020   Fall with injury 10/05/2020   Hematoma 09/16/2020   Normocytic anemia 09/14/2020   Leukocytosis 09/14/2020   AF (paroxysmal atrial fibrillation) (HCC) 09/14/2020   Chronic anticoagulation 09/14/2020   Hyponatremia 09/14/2020   Pain in left foot 01/22/2020   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   Pain in joint of right knee 12/27/2019   Recurrent falls 11/17/2019   Paroxysmal atrial fibrillation (HCC) 10/22/2019   Pain in right foot 10/14/2019   Falls 10/07/2019   Ataxia involving legs 08/12/2019   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   Urge incontinence 04/13/2018   Urinary urgency 04/13/2018   Pain in left knee 03/01/2018   Other intervertebral disc degeneration, lumbar region 06/16/2017   Acquired bilateral hammer toes 10/05/2015   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   Generalized anxiety disorder 05/12/2015   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   History of left-sided carotid endarterectomy 04/10/2014   Essential hypertension 04/10/2014   Occlusion and stenosis of carotid artery without mention of cerebral infarction 12/15/2011   Habitual alcohol use  12/01/2011   Carotid artery stenosis, symptomatic 11/30/2011   Cerebral infarction (HCC) 11/29/2011   Mixed hyperlipidemia     PCP: Bernardino Cone  REFERRING PROVIDER: Bernardino Cone  REFERRING DIAG: weakness, multiple falls  Rationale for Evaluation and Treatment: Rehabilitation  THERAPY DIAG:  Other abnormalities of gait and mobility  Repeated falls  Muscle weakness (generalized)  Contracture of muscle, multiple sites  Acute pain of right knee  Other low back pain  ONSET DATE: 10/07/22  SUBJECTIVE:  SUBJECTIVE STATEMENT: No falls, just feeling weak  Patient asking about plan, he reports that he is worried that he will not be able to stay independent and live at home if he stops, he has no equipment and with the right knee's inability to extend and the left knee having issues with buckling due to neuropathy, he is worried about more falls and the strain on his wife  Doing all right, had one fall, fell in the shower yesterday. Did exercises before the showers, legs gave out  on him  PERTINENT HISTORY:  Cerebral infarct 10/21/21, lumbar fusion, recurrent falls, has significant neuropathy and this is the reason for most of his falls   PAIN:  Are you having pain? Yes: NPRS scale: 0/10 Pain location: right knee Pain description: ache  Aggravating factors: being on my feet  Relieving factors: movement  PRECAUTIONS: Fall  WEIGHT BEARING RESTRICTIONS: No  FALLS:  Has patient fallen in last 6 months? Yes. Number of falls every few days   LIVING ENVIRONMENT: Lives with: lives with their spouse Lives in: House/apartment Stairs: Yes: Internal: 20 steps; on right going up Has following equipment at home: [name] - 4 wheeled, Wheelchair (manual), shower chair, Tour manager, Grab bars, Ramped  entry, and trekking pole  OCCUPATION: Retired  PLOF: Independent, prior to a year ago he was driving used trekking poles to walk  PATIENT GOALS: live at home  NEXT MD VISIT:   OBJECTIVE:   DIAGNOSTIC FINDINGS:    LUMBAR SPINE FINDINGS: 1. Wide laminectomy and posterior pedicle screw and rod fixation at L4-5 without residual or recurrent stenosis. 2. Progressive adjacent level disease at L3-4 with moderate right and mild left subarticular narrowing and moderate foraminal stenosis bilaterally. Progression is predominantly due to facet disease 3. Progressive moderate facet hypertrophy at L1-2 with a progressive rightward disc protrusion resulting in progressive moderate right foraminal stenosis. Asymmetric endplate marrow edema on the right at L1-2 is consistent with progressive disease as well. 4. Otherwise stable degenerative change without other focal stenosis.   THORACIC SPINE FINDINGS:  On sagittal views the vertebral bodies have normal height and alignment.  Hemangioma within T9 vertebral body and slightly within T10 vertebral body.  Anterior kyphosis.  Mild scoliosis convex right centered at T8 level. The spinal cord is normal in size and appearance. The paraspinal soft tissues are unremarkable.     On axial views there is no spinal stenosis or foraminal narrowing.  Limited views of the aorta, kidneys, liver, lungs and paraspinal muscles are notable for multiple large right renal cysts ranging in size from 1.5 to 7 cm.   COGNITION: Overall cognitive status: Within functional limits for tasks assessed     SENSATION: WFL  MUSCLE LENGTH: Hamstrings: mod tightness on both sides  POSTURE: rounded shoulders and forward head  LOWER EXTREMITY ROM:   the right knee has HS, has no quad tendon on the right, unable to lift the leg, left leg WFL's , left hip and right hip and ankles WFL's   LOWER EXTREMITY MMT: 4-/5 for the LE's except for the right knee extension UPPER  EXTREMITY MMT:  4-/5 for shoulders and elbows   FUNCTIONAL TESTS:  5XSTS: 83 seconds really has to use his hands and is unsteady with standing Cannot stand on his own without holding onto something due to poor balance   Transfers:  needs set up and MinA for safety, is impulsive and unsafe at times. GAIT: He is w/c dependent    TODAY'S TREATMENT:  DATE:  11/28/23 Standing in Pbars PT blocking knees weight shifts and marches 15# rows 15# straight arm pulls 20# triceps 20# biceps Leg press 50# Feet on ball K2C, rotation, bridges, isometric abs Black tband clamshess Ball squeeze with bridge   11/22/23 Pbars standing and marching with PT blocking knees Leg curls 45# 2x15, then right only 20# 2x15 Leg extension 10# left only 2 x 15 15# rows on pulleys 15# straight arm pulls 20# biceps 20# overhead triceps press 10# chest press Feet on ball K2C, rotation, bridge, isometric abs Passive right knee extension Black tband clamshells Ball b/n knees squeeze with bridge  11/14/23 Standing in the Pbars marching with PT blocking knees, toe raises 45# HS curls, then right only 20# 10# left only knee extension 15# rows 15# straight arm pulls Feet on ball K2C, rotation, bridges and isometric abs PAssive right knee extension, left piriformis stretch Black tband clamshells Ball b/n knees squeeze with bridge Asked him to give himself time after the nustep for his legs to recover before transfers  11/08/23 15# rows 15# straight arm pulls 10# chest press 20# triceps over head 15# biceps Standing in pbars, left hip flexion with 5#, then PT bracing right iwht hip flexion, did some right hip abduction 45# leg curls 20# right only 10# left leg extension Feet on ball rotation, bridge, isometric abs Passive stretch right knee into extension Black tband  clamshells  10/31/23 15# rows 3x15 13# straight arm pulls 3x15 15# biceps 20# overhead triceps press Leg curls 45# 2x15, then right only 25# 10# leg extension left only PWR moves in sitting for trunk mobility 3# UE circuit 3# trunk rotation with reaching Passive right knee extension Feet on ball K2C, rotation, bridges Partial sit ups Black tband clamshells STM to the left hip with Tgun  10/24/23 15# rows  15# straight arm pulls 15# biceps 20# overhead tricep press 45# leg curls  then right only 25# 10# leg extension left On mat partial sit ups with 6# held at chest Elbow touches to mat each side Seated reach to cone and then extend back up PWR move Feet on ball K2C, rotation, bridge isometric abs Black tband clamshells Ball squeeze with bridge Passive stretch right knee extension Left side lying right hip abduction SLR on the right  10/10/23 15# biceps 3x15 15# chest press 3x15 15# straight arms  15# rows 20# triceps press overhead Discussion about his HEP with a 5# weight at home, trying to have him not do much overhead stuff 45# leg curls , right only 25# 2x15 each 5# standing marches with PT max A to keep leg bearing weight straight Patient wanted to weight so with a lot of effort got him on scale weight was 178# Passive stretch of the left knee to prevent contracture Feet on ball bridges Partial sit ups   10/06/23 Rows 15lb x15, x20 Shoulder Ext 15lb 2x20 Curls 10lb x20, x15 Chest press 15lb 2x20 Leg press 50lb x 15, 40lb 2x15 At Mat Seated Crunches 2x15 Seated Trunk rotations with weighted ball Seated OHP blue ball 2x15 Horiz abd blue 2x10 Hip and black 2x15  09/26/23 At Mat Seated Crunches 2x15 Seated Trunk rotations with weighted ball Fitter press all bands w/ LLE 2x15  1black 1 blue band RLE 2x15 Military press 3lb 2x15  HS curls black 2x15   Rows black band 2x10  Horizontal abduction blue   Ankle R blue 2x15 15lb shoulder Ext 2x15 Biceps  curls 10lb 2x10 Triceps Ext  20lb 2x15   09/21/23 Nustep level 6 x 7 minutes with arms and then 4 minutes without arms Rows 15lb 2x15 Extension 15lb 2x15 15# biceps 20# triceps Chest press 15lb HS curls 55lb 2x15, RLE 20b 2x15 Leg Ext LLE 15lb 2x15 Fitter press RLE 1 blue 1black, LLE all bands   PATIENT EDUCATION:  Education details: POC Person educated: Patient Education method: Explanation Education comprehension: verbalized understanding  HOME EXERCISE PROGRAM:  ASSESSMENT:  CLINICAL IMPRESSION: WE continue to work on his overall health and hopefully his safety with transfers.    HE is doing the Nustep at home he is still having some hip pain, he does report not taking my advice fully about taking days off.  He still is unsafe at time with the transfers especially when he is fatigued, he is still impulsive and goes too fast at times. The right leg has no quad tendon and the left leg has neuropathy so he is at a very high risk for falls and for regression as he could not exercise much on his own and his wife cannot help him with the standing I feel CGA and blocking the knees are needed due to at any time the knees can buckle, we have tried to teach him to do hip extension to keep the knee locked but he does not do this.  We have set up the maintenance program following medicare guidelines.  He qualifies for this due to the inability to be safe, has had multiple falls and is impulsive, tends to think he can do more than he can.  Patient has not had a fall in the past 5 weeks, he remains very unsafe at times and with any transfer needs min A or close CGA.  Tends to not understand that the right knee has no quad to hold the leg straight.  I  In the long run he is impulsive and unsafe and cannot exercise on his own to maintain his level, he is at a high risk for falls and there is no gym that has the ability to assist him. Session completed at mat table to eliminate trunk support for improved  core stabilization. Core weakness with seated OHP.  Pt did report a fall yesterday stated he was standing pulling his pants up, I advised pt that he shouldn't put weight in his RLE, He ws adamant that he could put a little on it.     Justifying Skilled Maintenance for Rehab Services:   The goal of a skilled maintenance therapy program is to maintain the patient's current functional status or to prevent or slow deterioration. If a patient who is receiving restorative therapy then requires skilled maintenance therapy based on the skills and judgment of the therapist, development of a maintenance program would occur during the last visit for restorative treatment.    Does the patient need treatment that necessitates skilled services that can only be provided by a skilled provider? Can a caregiver provide these services why or why not? 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  He is impulsive and at times very unsafe he could not do this on his own and his wife could not help him., history of COPD, CKD, depression, HTN, kyphosis, a-fib, PVD, spinal stenosis, and stroke as  well as RC repair.  These limit the patient's independent carryover with PT  exercises and require max cueing for optimal performance of safety and to strengthen other mms for his future in therapy sessions.    The patient requires the skill of PT to help with ongoing exercise and mobility to prevent rapid and significant decline in functional mobility, strength, and to limit falls with his safety.  Therefore, continued skilled maintenance therapy is recommended for this patient.    OBJECTIVE IMPAIRMENTS: Abnormal gait, cardiopulmonary status limiting activity, decreased activity tolerance, decreased balance, decreased coordination, decreased  endurance, decreased mobility, difficulty walking, decreased ROM, decreased strength, improper body mechanics, postural dysfunction, and poor safety.   REHAB POTENTIAL: Fair dependent on carry over and patient compliance  CLINICAL DECISION MAKING: Evolving/moderate complexity  EVALUATION COMPLEXITY: Moderate  GOALS: Goals for skilled maintenance:     Long Term Goals: Target Date 12/13/23     The patient will preserve functional mobility within the home environment. Baseline: uses a w/c            Goal Status: Progressing 11/14/23   2. The patient will maintain strength for transfers.            Baseline: able to perform with set up and at times requires mod A and at other time may need Max A due to poor safety and impulsiveness            Goal Status: progressing 11/14/23   3. The patient will minimize fall frequency.            Baseline: he has been having about a fall a month            Goal Status: ongoing 11/14/23   4. Safe with HEP at home and with equipment he has            Baseline: does HEP             Goal Status: progressing 11/14/23   PLAN:  PT FREQUENCY: 1x/week  PT DURATION: 12 weeks  PLANNED INTERVENTIONS: Therapeutic exercises, Therapeutic activity, Neuromuscular re-education, Balance training, Gait training, Patient/Family education, Self Care, Joint mobilization, Stair training, Cryotherapy, Moist heat, and Manual therapy.  PLAN FOR NEXT SESSION: Have sat up the maintenance program for Mr. Gatlin and progressing with this, biggest issue he is unsafe and cannot do on his own or with his wife.  Trying to maintain his level of independence of living at home with wife and safety  OBADIAH OZELL ORN, PT

## 2023-12-05 ENCOUNTER — Ambulatory Visit: Admitting: Physical Therapy

## 2023-12-05 ENCOUNTER — Encounter: Payer: Self-pay | Admitting: Physical Therapy

## 2023-12-05 DIAGNOSIS — M6249 Contracture of muscle, multiple sites: Secondary | ICD-10-CM

## 2023-12-05 DIAGNOSIS — M6281 Muscle weakness (generalized): Secondary | ICD-10-CM

## 2023-12-05 DIAGNOSIS — R2689 Other abnormalities of gait and mobility: Secondary | ICD-10-CM | POA: Diagnosis not present

## 2023-12-05 DIAGNOSIS — R296 Repeated falls: Secondary | ICD-10-CM

## 2023-12-05 DIAGNOSIS — M5459 Other low back pain: Secondary | ICD-10-CM

## 2023-12-05 DIAGNOSIS — M25561 Pain in right knee: Secondary | ICD-10-CM

## 2023-12-05 NOTE — Therapy (Signed)
 OUTPATIENT PHYSICAL THERAPY TREATMENT    Patient Name: Brandon Mendoza. MRN: [id number] DOB:31-May-1940, 83 y.o., male Today's Date: 12/05/2023  END OF SESSION:  PT End of Session - 12/05/23 0846     Visit Number 46    Date for PT Re-Evaluation 12/15/23    Authorization Type Medicare    PT Start Time 0844    PT Stop Time 0928    PT Time Calculation (min) 44 min    Activity Tolerance Patient tolerated treatment well    Behavior During Therapy [facility] for tasks assessed/performed;Impulsive          Past Medical History:  Diagnosis Date   Acquired clawfoot, right foot 12/16/2021   Anxiety    Atrophy of calf muscles 10/21/2021   Duplex calves was negative for significant blockage EMG was done at [facility] MRI lumbar spine was also done at [facility]   Carotid artery occlusion    CKD (chronic kidney disease)    Stage 3   COPD (chronic obstructive pulmonary disease) (HCC)    Depression    Dysrhythmia    PAF, atrial tachycardia   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   The spinal cord appears normal. 2.   No spinal stenosis. 3.   Mild multilevel degenerative changes as detailed above that do not lead to spinal stenosis or nerve root compression. 4.   T2 hyperintense foci within the pons consistent with chronic microvascular ischemic changes.   Loop - Medtronic Linq 10/22/2020 10/22/2020   Nocturia    Paroxysmal atrial fibrillation (HCC)    Peripheral neuropathy    Peripheral vascular disease (HCC)    Sleep apnea    on 6 cm, nasal pillow   Spinal stenosis    Stroke (HCC) 05/01/2011   ischemic   Weakness generalized 10/21/2021   Severe, legs and arms   Past Surgical History:  Procedure Laterality Date   CAROTID ENDARTERECTOMY  12/09/11   Left cea    ENDARTERECTOMY  12/09/2011   Procedure: ENDARTERECTOMY CAROTID;  Surgeon: Krystal JULIANNA Doing, MD;  Location: [facility] OR;  Service: Vascular;  Laterality: Left;   EYE SURGERY     rt retina detachment   HAMMER TOE SURGERY  2004   HERNIA REPAIR  2006   I & D EXTREMITY Right 10/07/2022   Procedure: IRRIGATION AND DEBRIDEMENT EXTREMITY WITH WOUND CLOSURE;  Surgeon: Burnetta Aures, MD;  Location: [facility] ORS;  Service: Orthopedics;  Laterality: Right;   QUADRICEPS TENDON REPAIR Right 07/12/2022   Procedure: REPAIR QUADRICEP TENDON;  Surgeon: Ernie Cough, MD;  Location: [facility] ORS;  Service: Orthopedics;  Laterality: Right;   QUADRICEPS TENDON REPAIR Right 08/30/2022   Procedure: REPAIR QUADRICEP TENDON;  Surgeon: Ernie Cough, MD;  Location: [facility] ORS;  Service: Orthopedics;  Laterality: Right;   SHOULDER ARTHROSCOPY W/ ROTATOR CUFF REPAIR  2010   TONSILLECTOMY     Patient Active Problem List   Diagnosis Date Noted   Coronary artery calcification 05/09/2023   Rupture of quadriceps tendon 11/03/2022   Wound dehiscence 10/07/2022   Quadriceps tendon rupture, right, subsequent encounter 08/30/2022   Quadriceps tendon rupture, right, initial encounter 07/12/2022   Unable to care for self 07/08/2022   Dehydration 07/08/2022  Depression 07/08/2022   COPD (chronic obstructive pulmonary disease) (HCC) 07/08/2022   Sialoadenitis of submandibular gland 04/28/2022   Macular degeneration 03/01/2022   Myofascial pain 02/10/2022   Lumbar spondylosis 01/03/2022   Acquired clawfoot, left foot 12/16/2021   Acquired clawfoot, right foot 12/16/2021   Claw foot 12/14/2021   Gait abnormality 12/10/2021   Spinal stenosis at L4-L5 level 12/10/2021   Ataxia 11/22/2021   Lightheadedness 11/02/2021   Claudication of lower extremity (HCC) 10/21/2021   Atrophy of calf muscles 10/21/2021   Weakness generalized 10/21/2021   Focal neurological deficit 10/21/2021   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    Pain in joint of right hip 09/14/2021   High arches 07/22/2021   Hammertoes of both feet 07/22/2021   Neuropathy 07/22/2021   Constipation 05/28/2021   Disequilibrium 05/28/2021   History of colonic polyps 05/28/2021   Renal cyst 05/28/2021   Gastroesophageal reflux disease without esophagitis 05/05/2021   Ingrowing left great toenail 02/15/2021   S/P lumbar fusion 12/24/2020   Spinal stenosis of lumbar region 12/04/2020   Acquired complex renal cyst 11/23/2020   Functional gait abnormality 11/04/2020   Idiopathic neuropathy 11/02/2020   Tinea pedis of both feet 10/28/2020   Loop - Medtronic Linq 10/22/2020 10/22/2020   Benign hypertension with 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) 10/05/2020   Fall with injury 10/05/2020   Hematoma 09/16/2020   Normocytic anemia 09/14/2020   Leukocytosis 09/14/2020   AF (paroxysmal atrial fibrillation) (HCC) 09/14/2020   Chronic anticoagulation 09/14/2020   Hyponatremia 09/14/2020   Pain in left foot 01/22/2020   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   Pain in joint of right knee 12/27/2019   Recurrent falls 11/17/2019   Paroxysmal atrial fibrillation (HCC) 10/22/2019   Pain in right foot 10/14/2019   Falls 10/07/2019   Ataxia involving legs 08/12/2019   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   Urge incontinence 04/13/2018   Urinary urgency 04/13/2018   Pain in left knee 03/01/2018   Other intervertebral disc degeneration, lumbar region 06/16/2017   Acquired bilateral hammer toes 10/05/2015   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   Generalized anxiety disorder 05/12/2015   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   History of left-sided carotid endarterectomy 04/10/2014   Essential hypertension 04/10/2014   Occlusion and stenosis of carotid artery without mention of cerebral infarction 12/15/2011   Habitual alcohol use  12/01/2011   Carotid artery stenosis, symptomatic 11/30/2011   Cerebral infarction (HCC) 11/29/2011   Mixed hyperlipidemia     PCP: Bernardino Cone  REFERRING PROVIDER: Bernardino Cone  REFERRING DIAG: weakness, multiple falls  Rationale for Evaluation and Treatment: Rehabilitation  THERAPY DIAG:  Other abnormalities of gait and mobility  Repeated falls  Muscle weakness (generalized)  Contracture of muscle, multiple sites  Acute pain of right knee  Other low back pain  ONSET DATE: 10/07/22  SUBJECTIVE:  SUBJECTIVE STATEMENT: No falls, shoulder feeling better since you had me stop the overhead stuff  Patient asking about plan, he reports that he is worried that he will not be able to stay independent and live at home if he stops, he has no equipment and with the right knee's inability to extend and the left knee having issues with buckling due to neuropathy, he is worried about more falls and the strain on his wife  Doing all right, had one fall, fell in the shower yesterday. Did exercises before the showers, legs gave out  on him  PERTINENT HISTORY:  Cerebral infarct 10/21/21, lumbar fusion, recurrent falls, has significant neuropathy and this is the reason for most of his falls   PAIN:  Are you having pain? Yes: NPRS scale: 0/10 Pain location: right knee Pain description: ache  Aggravating factors: being on my feet  Relieving factors: movement  PRECAUTIONS: Fall  WEIGHT BEARING RESTRICTIONS: No  FALLS:  Has patient fallen in last 6 months? Yes. Number of falls every few days   LIVING ENVIRONMENT: Lives with: lives with their spouse Lives in: House/apartment Stairs: Yes: Internal: 20 steps; on right going up Has following equipment at home: [name] - 4 wheeled, Wheelchair (manual),  shower chair, Tour manager, Grab bars, Ramped entry, and trekking pole  OCCUPATION: Retired  PLOF: Independent, prior to a year ago he was driving used trekking poles to walk  PATIENT GOALS: live at home  NEXT MD VISIT:   OBJECTIVE:   DIAGNOSTIC FINDINGS:    LUMBAR SPINE FINDINGS: 1. Wide laminectomy and posterior pedicle screw and rod fixation at L4-5 without residual or recurrent stenosis. 2. Progressive adjacent level disease at L3-4 with moderate right and mild left subarticular narrowing and moderate foraminal stenosis bilaterally. Progression is predominantly due to facet disease 3. Progressive moderate facet hypertrophy at L1-2 with a progressive rightward disc protrusion resulting in progressive moderate right foraminal stenosis. Asymmetric endplate marrow edema on the right at L1-2 is consistent with progressive disease as well. 4. Otherwise stable degenerative change without other focal stenosis.   THORACIC SPINE FINDINGS:  On sagittal views the vertebral bodies have normal height and alignment.  Hemangioma within T9 vertebral body and slightly within T10 vertebral body.  Anterior kyphosis.  Mild scoliosis convex right centered at T8 level. The spinal cord is normal in size and appearance. The paraspinal soft tissues are unremarkable.     On axial views there is no spinal stenosis or foraminal narrowing.  Limited views of the aorta, kidneys, liver, lungs and paraspinal muscles are notable for multiple large right renal cysts ranging in size from 1.5 to 7 cm.   COGNITION: Overall cognitive status: Within functional limits for tasks assessed     SENSATION: WFL  MUSCLE LENGTH: Hamstrings: mod tightness on both sides  POSTURE: rounded shoulders and forward head  LOWER EXTREMITY ROM:   the right knee has HS, has no quad tendon on the right, unable to lift the leg, left leg WFL's , left hip and right hip and ankles WFL's   LOWER EXTREMITY MMT: 4-/5 for the LE's  except for the right knee extension UPPER EXTREMITY MMT:  4-/5 for shoulders and elbows   FUNCTIONAL TESTS:  5XSTS: 83 seconds really has to use his hands and is unsteady with standing Cannot stand on his own without holding onto something due to poor balance   Transfers:  needs set up and MinA for safety, is impulsive and unsafe at times. GAIT: He is  w/c dependent    TODAY'S TREATMENT:                                                                                                                              DATE:  12/05/23 15# rows 15# straight arm pulls 45# leg curls the right only 20# LEft leg ext 10# 10# chest press 20# triceps  20# biceps Passive stretch of the right knee, passive HS and piriformis stretch bilaterally Feet on ball K2C, rotation, bridge, isometric abs Black tband clamshells   11/28/23 Standing in Pbars PT blocking knees weight shifts and marches 15# rows 15# straight arm pulls 20# triceps 20# biceps Leg press 50# Feet on ball K2C, rotation, bridges, isometric abs Black tband clamshess Ball squeeze with bridge   11/22/23 Pbars standing and marching with PT blocking knees Leg curls 45# 2x15, then right only 20# 2x15 Leg extension 10# left only 2 x 15 15# rows on pulleys 15# straight arm pulls 20# biceps 20# overhead triceps press 10# chest press Feet on ball K2C, rotation, bridge, isometric abs Passive right knee extension Black tband clamshells Ball b/n knees squeeze with bridge  11/14/23 Standing in the Pbars marching with PT blocking knees, toe raises 45# HS curls, then right only 20# 10# left only knee extension 15# rows 15# straight arm pulls Feet on ball K2C, rotation, bridges and isometric abs PAssive right knee extension, left piriformis stretch Black tband clamshells Ball b/n knees squeeze with bridge Asked him to give himself time after the nustep for his legs to recover before transfers  11/08/23 15# rows 15# straight arm  pulls 10# chest press 20# triceps over head 15# biceps Standing in pbars, left hip flexion with 5#, then PT bracing right iwht hip flexion, did some right hip abduction 45# leg curls 20# right only 10# left leg extension Feet on ball rotation, bridge, isometric abs Passive stretch right knee into extension Black tband clamshells  10/31/23 15# rows 3x15 13# straight arm pulls 3x15 15# biceps 20# overhead triceps press Leg curls 45# 2x15, then right only 25# 10# leg extension left only PWR moves in sitting for trunk mobility 3# UE circuit 3# trunk rotation with reaching Passive right knee extension Feet on ball K2C, rotation, bridges Partial sit ups Black tband clamshells STM to the left hip with Tgun  10/24/23 15# rows  15# straight arm pulls 15# biceps 20# overhead tricep press 45# leg curls  then right only 25# 10# leg extension left On mat partial sit ups with 6# held at chest Elbow touches to mat each side Seated reach to cone and then extend back up PWR move Feet on ball K2C, rotation, bridge isometric abs Black tband clamshells Ball squeeze with bridge Passive stretch right knee extension Left side lying right hip abduction SLR on the right  10/10/23 15# biceps 3x15 15# chest press 3x15 15# straight arms  15# rows 20# triceps press overhead Discussion about his HEP  with a 5# weight at home, trying to have him not do much overhead stuff 45# leg curls , right only 25# 2x15 each 5# standing marches with PT max A to keep leg bearing weight straight Patient wanted to weight so with a lot of effort got him on scale weight was 178# Passive stretch of the left knee to prevent contracture Feet on ball bridges Partial sit ups   10/06/23 Rows 15lb x15, x20 Shoulder Ext 15lb 2x20 Curls 10lb x20, x15 Chest press 15lb 2x20 Leg press 50lb x 15, 40lb 2x15 At Mat Seated Crunches 2x15 Seated Trunk rotations with weighted ball Seated OHP blue ball 2x15 Horiz abd blue  2x10 Hip and black 2x15  09/26/23 At Mat Seated Crunches 2x15 Seated Trunk rotations with weighted ball Fitter press all bands w/ LLE 2x15  1black 1 blue band RLE 2x15 Military press 3lb 2x15  HS curls black 2x15   Rows black band 2x10  Horizontal abduction blue   Ankle R blue 2x15 15lb shoulder Ext 2x15 Biceps curls 10lb 2x10 Triceps Ext 20lb 2x15   09/21/23 Nustep level 6 x 7 minutes with arms and then 4 minutes without arms Rows 15lb 2x15 Extension 15lb 2x15 15# biceps 20# triceps Chest press 15lb HS curls 55lb 2x15, RLE 20b 2x15 Leg Ext LLE 15lb 2x15 Fitter press RLE 1 blue 1black, LLE all bands   PATIENT EDUCATION:  Education details: POC Person educated: Patient Education method: Explanation Education comprehension: verbalized understanding  HOME EXERCISE PROGRAM:  ASSESSMENT:  CLINICAL IMPRESSION: WE continue to work on his overall health and hopefully his safety with transfers.    HE is doing the Nustep at home he is still having some hip pain, he does report not taking my advice fully about taking days off.  He still is unsafe at time with the transfers especially when he is fatigued, he is still impulsive and goes too fast at times. The right leg has no quad tendon and the left leg has neuropathy so he is at a very high risk for falls and for regression as he could not exercise much on his own and his wife cannot help him with the standing I feel CGA and blocking the knees are needed due to at any time the knees can buckle, we have tried to teach him to do hip extension to keep the knee locked but he does not do this.  We have set up the maintenance program following medicare guidelines.  He qualifies for this due to the inability to be safe, has had multiple falls and is impulsive, tends to think he can do more than he can.  Patient has not had a fall in the past 5 weeks, he remains very unsafe at times and with any transfer needs min A or close CGA.  Tends to not  understand that the right knee has no quad to hold the leg straight.  I  In the long run he is impulsive and unsafe and cannot exercise on his own to maintain his level, he is at a high risk for falls and there is no gym that has the ability to assist him. Session completed at mat table to eliminate trunk support for improved core stabilization. Core weakness with seated OHP.  Pt did report a fall yesterday stated he was standing pulling his pants up, I advised pt that he shouldn't put weight in his RLE, He ws adamant that he could put a little on it.  Justifying Skilled Maintenance for Rehab Services:   The goal of a skilled maintenance therapy program is to maintain the patient's current functional status or to prevent or slow deterioration. If a patient who is receiving restorative therapy then requires skilled maintenance therapy based on the skills and judgment of the therapist, development of a maintenance program would occur during the last visit for restorative treatment.    Does the patient need treatment that necessitates skilled services that can only be provided by a skilled provider? Can a caregiver provide these services why or why not? 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  He is impulsive and at times very unsafe he could not do this on his own and his wife could not help him., history of COPD, CKD, depression, HTN, kyphosis, a-fib, PVD, spinal stenosis, and stroke as well as RC repair.  These limit the patient's independent carryover with PT  exercises and require max cueing for optimal performance of safety and to strengthen other mms for his future in therapy sessions.    The patient requires the skill of PT to help with ongoing exercise and mobility to prevent rapid and significant  decline in functional mobility, strength, and to limit falls with his safety.  Therefore, continued skilled maintenance therapy is recommended for this patient.    OBJECTIVE IMPAIRMENTS: Abnormal gait, cardiopulmonary status limiting activity, decreased activity tolerance, decreased balance, decreased coordination, decreased endurance, decreased mobility, difficulty walking, decreased ROM, decreased strength, improper body mechanics, postural dysfunction, and poor safety.   REHAB POTENTIAL: Fair dependent on carry over and patient compliance  CLINICAL DECISION MAKING: Evolving/moderate complexity  EVALUATION COMPLEXITY: Moderate  GOALS: Goals for skilled maintenance:     Long Term Goals: Target Date 12/13/23     The patient will preserve functional mobility within the home environment. Baseline: uses a w/c            Goal Status: Progressing 11/14/23   2. The patient will maintain strength for transfers.            Baseline: able to perform with set up and at times requires mod A and at other time may need Max A due to poor safety and impulsiveness            Goal Status: progressing 11/14/23   3. The patient will minimize fall frequency.            Baseline: he has been having about a fall a month            Goal Status: ongoing 11/14/23   4. Safe with HEP at home and with equipment he has            Baseline: does HEP             Goal Status: progressing 11/14/23   PLAN:  PT FREQUENCY: 1x/week  PT DURATION: 12 weeks  PLANNED INTERVENTIONS: Therapeutic exercises, Therapeutic activity, Neuromuscular re-education, Balance training, Gait training, Patient/Family education, Self Care, Joint mobilization, Stair training, Cryotherapy, Moist heat, and Manual therapy.  PLAN FOR NEXT SESSION: Have sat up the maintenance program for Mr. Olliff and progressing with this, biggest issue he is unsafe and cannot do on his own or with his wife.  Trying to maintain his level of  independence of  living at home with wife and safety  OBADIAH OZELL ORN, PT

## 2023-12-11 ENCOUNTER — Ambulatory Visit (INDEPENDENT_AMBULATORY_CARE_PROVIDER_SITE_OTHER)

## 2023-12-11 ENCOUNTER — Ambulatory Visit: Admitting: Physical Therapy

## 2023-12-11 DIAGNOSIS — I480 Paroxysmal atrial fibrillation: Secondary | ICD-10-CM | POA: Diagnosis not present

## 2023-12-12 ENCOUNTER — Ambulatory Visit

## 2023-12-12 ENCOUNTER — Ambulatory Visit (INDEPENDENT_AMBULATORY_CARE_PROVIDER_SITE_OTHER)

## 2023-12-12 DIAGNOSIS — Z23 Encounter for immunization: Secondary | ICD-10-CM

## 2023-12-12 LAB — CUP PACEART REMOTE DEVICE CHECK
Date Time Interrogation Session: 20250914232257
Implantable Pulse Generator Implant Date: 20220728

## 2023-12-12 NOTE — Progress Notes (Signed)
 Per orders of Dr Jesus, injection of infuenza given in the [facility]iod [facility].  Patient tolerated injection well.

## 2023-12-13 ENCOUNTER — Ambulatory Visit: Admitting: Physical Therapy

## 2023-12-13 ENCOUNTER — Telehealth: Payer: Self-pay | Admitting: Cardiology

## 2023-12-13 ENCOUNTER — Encounter: Payer: Self-pay | Admitting: Physical Therapy

## 2023-12-13 DIAGNOSIS — R2689 Other abnormalities of gait and mobility: Secondary | ICD-10-CM

## 2023-12-13 DIAGNOSIS — M5459 Other low back pain: Secondary | ICD-10-CM

## 2023-12-13 DIAGNOSIS — M25561 Pain in right knee: Secondary | ICD-10-CM

## 2023-12-13 DIAGNOSIS — R296 Repeated falls: Secondary | ICD-10-CM

## 2023-12-13 DIAGNOSIS — M6281 Muscle weakness (generalized): Secondary | ICD-10-CM

## 2023-12-13 DIAGNOSIS — M6249 Contracture of muscle, multiple sites: Secondary | ICD-10-CM

## 2023-12-13 DIAGNOSIS — I480 Paroxysmal atrial fibrillation: Secondary | ICD-10-CM

## 2023-12-13 MED ORDER — FLECAINIDE ACETATE 100 MG PO TABS: 100.0000 mg | ORAL_TABLET | Freq: Two times a day (BID) | ORAL | 0 refills | Status: DC

## 2023-12-13 NOTE — Therapy (Signed)
 OUTPATIENT PHYSICAL THERAPY TREATMENT    Patient Name: Brandon Mendoza. MRN: [id number] DOB:1941/01/21, 83 y.o., male Today's Date: 12/13/2023  END OF SESSION:  PT End of Session - 12/13/23 0850     Visit Number 47    Date for PT Re-Evaluation 12/15/23    Authorization Type Medicare    PT Start Time 0843    PT Stop Time 0928    PT Time Calculation (min) 45 min    Activity Tolerance Patient tolerated treatment well    Behavior During Therapy [facility] for tasks assessed/performed;Impulsive          Past Medical History:  Diagnosis Date   Acquired clawfoot, right foot 12/16/2021   Anxiety    Atrophy of calf muscles 10/21/2021   Duplex calves was negative for significant blockage EMG was done at [facility] MRI lumbar spine was also done at [facility]   Carotid artery occlusion    CKD (chronic kidney disease)    Stage 3   COPD (chronic obstructive pulmonary disease) (HCC)    Depression    Dysrhythmia    PAF, atrial tachycardia   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   The spinal cord appears normal. 2.   No spinal stenosis. 3.   Mild multilevel degenerative changes as detailed above that do not lead to spinal stenosis or nerve root compression. 4.   T2 hyperintense foci within the pons consistent with chronic microvascular ischemic changes.   Loop - Medtronic Linq 10/22/2020 10/22/2020   Nocturia    Paroxysmal atrial fibrillation (HCC)    Peripheral neuropathy    Peripheral vascular disease (HCC)    Sleep apnea    on 6 cm, nasal pillow   Spinal stenosis    Stroke (HCC) 05/01/2011   ischemic   Weakness generalized 10/21/2021   Severe, legs and arms   Past Surgical History:  Procedure Laterality Date   CAROTID ENDARTERECTOMY  12/09/11   Left cea    ENDARTERECTOMY  12/09/2011   Procedure: ENDARTERECTOMY CAROTID;  Surgeon: Krystal JULIANNA Doing, MD;  Location: [facility] OR;  Service: Vascular;  Laterality: Left;   EYE SURGERY     rt retina detachment   HAMMER TOE SURGERY  2004   HERNIA REPAIR  2006   I & D EXTREMITY Right 10/07/2022   Procedure: IRRIGATION AND DEBRIDEMENT EXTREMITY WITH WOUND CLOSURE;  Surgeon: Burnetta Aures, MD;  Location: [facility] ORS;  Service: Orthopedics;  Laterality: Right;   QUADRICEPS TENDON REPAIR Right 07/12/2022   Procedure: REPAIR QUADRICEP TENDON;  Surgeon: Ernie Cough, MD;  Location: [facility];  Service: Orthopedics;  Laterality: Right;   QUADRICEPS TENDON REPAIR Right 08/30/2022   Procedure: REPAIR QUADRICEP TENDON;  Surgeon: Ernie Cough, MD;  Location: [facility];  Service: Orthopedics;  Laterality: Right;   SHOULDER ARTHROSCOPY W/ ROTATOR CUFF REPAIR  2010   TONSILLECTOMY     Patient Active Problem List   Diagnosis Date Noted   Coronary artery calcification 05/09/2023   Rupture of quadriceps tendon 11/03/2022   Wound dehiscence 10/07/2022   Quadriceps tendon rupture, right, subsequent encounter 08/30/2022   Quadriceps tendon rupture, right, initial encounter 07/12/2022   Unable to care for self 07/08/2022   Dehydration 07/08/2022  Depression 07/08/2022   COPD (chronic obstructive pulmonary disease) (HCC) 07/08/2022   Sialoadenitis of submandibular gland 04/28/2022   Macular degeneration 03/01/2022   Myofascial pain 02/10/2022   Lumbar spondylosis 01/03/2022   Acquired clawfoot, left foot 12/16/2021   Acquired clawfoot, right foot 12/16/2021   Claw foot 12/14/2021   Gait abnormality 12/10/2021   Spinal stenosis at L4-L5 level 12/10/2021   Ataxia 11/22/2021   Lightheadedness 11/02/2021   Claudication of lower extremity (HCC) 10/21/2021   Atrophy of calf muscles 10/21/2021   Weakness generalized 10/21/2021   Focal neurological deficit 10/21/2021   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    Pain in joint of right hip 09/14/2021   High arches 07/22/2021   Hammertoes of both feet 07/22/2021   Neuropathy 07/22/2021   Constipation 05/28/2021   Disequilibrium 05/28/2021   History of colonic polyps 05/28/2021   Renal cyst 05/28/2021   Gastroesophageal reflux disease without esophagitis 05/05/2021   Ingrowing left great toenail 02/15/2021   S/P lumbar fusion 12/24/2020   Spinal stenosis of lumbar region 12/04/2020   Acquired complex renal cyst 11/23/2020   Functional gait abnormality 11/04/2020   Idiopathic neuropathy 11/02/2020   Tinea pedis of both feet 10/28/2020   Loop - Medtronic Linq 10/22/2020 10/22/2020   Benign hypertension with 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) 10/05/2020   Fall with injury 10/05/2020   Hematoma 09/16/2020   Normocytic anemia 09/14/2020   Leukocytosis 09/14/2020   AF (paroxysmal atrial fibrillation) (HCC) 09/14/2020   Chronic anticoagulation 09/14/2020   Hyponatremia 09/14/2020   Pain in left foot 01/22/2020   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   Pain in joint of right knee 12/27/2019   Recurrent falls 11/17/2019   Paroxysmal atrial fibrillation (HCC) 10/22/2019   Pain in right foot 10/14/2019   Falls 10/07/2019   Ataxia involving legs 08/12/2019   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   Urge incontinence 04/13/2018   Urinary urgency 04/13/2018   Pain in left knee 03/01/2018   Other intervertebral disc degeneration, lumbar region 06/16/2017   Acquired bilateral hammer toes 10/05/2015   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   Generalized anxiety disorder 05/12/2015   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   History of left-sided carotid endarterectomy 04/10/2014   Essential hypertension 04/10/2014   Occlusion and stenosis of carotid artery without mention of cerebral infarction 12/15/2011   Habitual alcohol use  12/01/2011   Carotid artery stenosis, symptomatic 11/30/2011   Cerebral infarction (HCC) 11/29/2011   Mixed hyperlipidemia     PCP: Bernardino Cone  REFERRING PROVIDER: Bernardino Cone  REFERRING DIAG: weakness, multiple falls  Rationale for Evaluation and Treatment: Rehabilitation  THERAPY DIAG:  Other abnormalities of gait and mobility  Repeated falls  Muscle weakness (generalized)  Contracture of muscle, multiple sites  Acute pain of right knee  Other low back pain  ONSET DATE: 10/07/22  SUBJECTIVE:  SUBJECTIVE STATEMENT: Patient reports 2 falls over the weekend, reports that one time he forgot to lock the brakes and fell from the w/c when he was transferring, the other fall he reports that his hospital bed was unlocked by his foot when transferring and the bed moved and he fell, he was able to get up both times with min A  Patient asking about plan, he reports that he is worried that he will not be able to stay independent and live at home if he stops, he has no equipment and with the right knee's inability to extend and the left knee having issues with buckling due to neuropathy, he is worried about more falls and the strain on his wife  Doing all right, had one fall, fell in the shower yesterday. Did exercises before the showers, legs gave out  on him  PERTINENT HISTORY:  Cerebral infarct 10/21/21, lumbar fusion, recurrent falls, has significant neuropathy and this is the reason for most of his falls   PAIN:  Are you having pain? Yes: NPRS scale: 0/10 Pain location: right knee Pain description: ache  Aggravating factors: being on my feet  Relieving factors: movement  PRECAUTIONS: Fall  WEIGHT BEARING RESTRICTIONS: No  FALLS:  Has patient fallen in last 6 months? Yes. Number of falls  every few days   LIVING ENVIRONMENT: Lives with: lives with their spouse Lives in: House/apartment Stairs: Yes: Internal: 20 steps; on right going up Has following equipment at home: Brandon Mendoza - 4 wheeled, Wheelchair (manual), shower chair, Tour manager, Grab bars, Ramped entry, and trekking pole  OCCUPATION: Retired  PLOF: Independent, prior to a year ago he was driving used trekking poles to walk  PATIENT GOALS: live at home  NEXT MD VISIT:   OBJECTIVE:   DIAGNOSTIC FINDINGS:    LUMBAR SPINE FINDINGS: 1. Wide laminectomy and posterior pedicle screw and rod fixation at L4-5 without residual or recurrent stenosis. 2. Progressive adjacent level disease at L3-4 with moderate right and mild left subarticular narrowing and moderate foraminal stenosis bilaterally. Progression is predominantly due to facet disease 3. Progressive moderate facet hypertrophy at L1-2 with a progressive rightward disc protrusion resulting in progressive moderate right foraminal stenosis. Asymmetric endplate marrow edema on the right at L1-2 is consistent with progressive disease as well. 4. Otherwise stable degenerative change without other focal stenosis.   THORACIC SPINE FINDINGS:  On sagittal views the vertebral bodies have normal height and alignment.  Hemangioma within T9 vertebral body and slightly within T10 vertebral body.  Anterior kyphosis.  Mild scoliosis convex right centered at T8 level. The spinal cord is normal in size and appearance. The paraspinal soft tissues are unremarkable.     On axial views there is no spinal stenosis or foraminal narrowing.  Limited views of the aorta, kidneys, liver, lungs and paraspinal muscles are notable for multiple large right renal cysts ranging in size from 1.5 to 7 cm.   COGNITION: Overall cognitive status: Within functional limits for tasks assessed     SENSATION: WFL  MUSCLE LENGTH: Hamstrings: mod tightness on both sides  POSTURE: rounded  shoulders and forward head  LOWER EXTREMITY ROM:   the right knee has HS, has no quad tendon on the right, unable to lift the leg, left leg WFL's , left hip and right hip and ankles WFL's   LOWER EXTREMITY MMT: 4-/5 for the LE's except for the right knee extension UPPER EXTREMITY MMT:  4-/5 for shoulders and elbows   FUNCTIONAL TESTS:  5XSTS: 83 seconds really has to use his hands and is unsteady with standing Cannot stand on his own without holding onto something due to poor balance   Transfers:  needs set up and MinA for safety, is impulsive and unsafe at times. GAIT: He is w/c dependent    TODAY'S TREATMENT:                                                                                                                              DATE:  12/13/23 15# rows 15# straight arm pulls 20# biceps 20# triceps 10# chest press In Pbars, worked on standing, then some marching 3 sets 15 each leg with PT blocking the stance leg Passive stretch right knee into extension Feet on ball K2C, rotation, bridge, isometric abs Black tband clamshells Black tband hip extension supine Ball b/n knees squeeze with bottom raise  12/05/23 15# rows 15# straight arm pulls 45# leg curls the right only 20# LEft leg ext 10# 10# chest press 20# triceps  20# biceps Passive stretch of the right knee, passive HS and piriformis stretch bilaterally Feet on ball K2C, rotation, bridge, isometric abs Black tband clamshells  11/28/23 Standing in Pbars PT blocking knees weight shifts and marches 15# rows 15# straight arm pulls 20# triceps 20# biceps Leg press 50# Feet on ball K2C, rotation, bridges, isometric abs Black tband clamshess Ball squeeze with bridge   11/22/23 Pbars standing and marching with PT blocking knees Leg curls 45# 2x15, then right only 20# 2x15 Leg extension 10# left only 2 x 15 15# rows on pulleys 15# straight arm pulls 20# biceps 20# overhead triceps press 10# chest press Feet  on ball K2C, rotation, bridge, isometric abs Passive right knee extension Black tband clamshells Ball b/n knees squeeze with bridge  11/14/23 Standing in the Pbars marching with PT blocking knees, toe raises 45# HS curls, then right only 20# 10# left only knee extension 15# rows 15# straight arm pulls Feet on ball K2C, rotation, bridges and isometric abs PAssive right knee extension, left piriformis stretch Black tband clamshells Ball b/n knees squeeze with bridge Asked him to give himself time after the nustep for his legs to recover before transfers   PATIENT EDUCATION:  Education details: POC Person educated: Patient Education method: Explanation Education comprehension: verbalized understanding  HOME EXERCISE PROGRAM:  ASSESSMENT:  CLINICAL IMPRESSION:   I again worked with him on the transfers, he feels like he needs to go clock wise only, the issue with this is from the car he turns all the way around, having more time where he is bearing weight on the right leg.  However this is something different for him to try to change and he did not feel good about it, we will need to continue to work on this.  WE continue to work on his overall health and hopefully his safety with transfers.    HE is doing the Nustep at home he  is still having some hip pain, he does report not taking my advice fully about taking days off.  He still is unsafe at time with the transfers especially when he is fatigued, he is still impulsive and goes too fast at times. The right leg has no quad tendon and the left leg has neuropathy so he is at a very high risk for falls and for regression as he could not exercise much on his own and his wife cannot help him with the standing I feel CGA and blocking the knees are needed due to at any time the knees can buckle, we have tried to teach him to do hip extension to keep the knee locked but he does not do this.  We have set up the maintenance program following  medicare guidelines.  He qualifies for this due to the inability to be safe, has had multiple falls and is impulsive, tends to think he can do more than he can.  Patient has not had a fall in the past 5 weeks, he remains very unsafe at times and with any transfer needs min A or close CGA.  Tends to not understand that the right knee has no quad to hold the leg straight.  I  In the long run he is impulsive and unsafe and cannot exercise on his own to maintain his level, he is at a high risk for falls and there is no gym that has the ability to assist him. Session completed at mat table to eliminate trunk support for improved core stabilization. Core weakness with seated OHP.  Pt did report a fall yesterday stated he was standing pulling his pants up, I advised pt that he shouldn't put weight in his RLE, He ws adamant that he could put a little on it.     Justifying Skilled Maintenance for Rehab Services:   The goal of a skilled maintenance therapy program is to maintain the patient's current functional status or to prevent or slow deterioration. If a patient who is receiving restorative therapy then requires skilled maintenance therapy based on the skills and judgment of the therapist, development of a maintenance program would occur during the last visit for restorative treatment.    Does the patient need treatment that necessitates skilled services that can only be provided by a skilled provider? Can a caregiver provide these services why or why not? 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  He is impulsive and at times very unsafe he could not do this on his own and his wife could not help him., history of COPD, CKD, depression, HTN, kyphosis, a-fib, PVD, spinal stenosis, and stroke as well as RC repair.  These  limit the patient's independent carryover with PT  exercises and require max cueing for optimal performance of safety and to strengthen other mms for his future in therapy sessions.    The patient requires the skill of PT to help with ongoing exercise and mobility to prevent rapid and significant decline in functional mobility, strength, and to limit falls with his safety.  Therefore, continued skilled maintenance therapy is recommended for this patient.    OBJECTIVE IMPAIRMENTS: Abnormal gait, cardiopulmonary status limiting activity, decreased activity tolerance, decreased balance, decreased coordination, decreased endurance, decreased mobility, difficulty walking,  decreased ROM, decreased strength, improper body mechanics, postural dysfunction, and poor safety.   REHAB POTENTIAL: Fair dependent on carry over and patient compliance  CLINICAL DECISION MAKING: Evolving/moderate complexity  EVALUATION COMPLEXITY: Moderate  GOALS: Goals for skilled maintenance:     Long Term Goals: Target Date 12/13/23     The patient will preserve functional mobility within the home environment. Baseline: uses a w/c            Goal Status: Progressing 11/14/23   2. The patient will maintain strength for transfers.            Baseline: able to perform with set up and at times requires mod A and at other time may need Max A due to poor safety and impulsiveness            Goal Status: progressing 11/14/23   3. The patient will minimize fall frequency.            Baseline: he has been having about a fall a month            Goal Status: ongoing 11/14/23   4. Safe with HEP at home and with equipment he has            Baseline: does HEP             Goal Status: progressing 11/14/23   PLAN:  PT FREQUENCY: 1x/week  PT DURATION: 12 weeks  PLANNED INTERVENTIONS: Therapeutic exercises, Therapeutic activity, Neuromuscular re-education, Balance training, Gait training, Patient/Family education, Self Care,  Joint mobilization, Stair training, Cryotherapy, Moist heat, and Manual therapy.  PLAN FOR NEXT SESSION: Have sat up the maintenance program for Mr. Dutch and progressing with this, biggest issue he is unsafe and cannot do on his own or with his wife.  Trying to maintain his level of independence of living at home with wife and safety  Brandon Mendoza, PT

## 2023-12-13 NOTE — Telephone Encounter (Signed)
*  STAT* If patient is at the pharmacy, call can be transferred to refill team.   1. Which medications need to be refilled? (please list name of each medication and dose if known)   flecainide  (TAMBOCOR ) 100 MG tablet    2. Which pharmacy/location (including street and city if local pharmacy) is medication to be sent to? [facility] [id number] - [place], [place] - [address]   3. Do they need a 30 day or 90 day supply? 90

## 2023-12-13 NOTE — Telephone Encounter (Signed)
 Pt's medication was sent to pt's pharmacy as requested. Confirmation received.

## 2023-12-18 ENCOUNTER — Encounter: Payer: Self-pay | Admitting: Physical Therapy

## 2023-12-18 ENCOUNTER — Ambulatory Visit: Payer: Self-pay | Admitting: Cardiology

## 2023-12-18 ENCOUNTER — Ambulatory Visit: Admitting: Physical Therapy

## 2023-12-18 DIAGNOSIS — M6281 Muscle weakness (generalized): Secondary | ICD-10-CM

## 2023-12-18 DIAGNOSIS — R296 Repeated falls: Secondary | ICD-10-CM

## 2023-12-18 DIAGNOSIS — M6249 Contracture of muscle, multiple sites: Secondary | ICD-10-CM

## 2023-12-18 DIAGNOSIS — R2689 Other abnormalities of gait and mobility: Secondary | ICD-10-CM | POA: Diagnosis not present

## 2023-12-18 DIAGNOSIS — M25561 Pain in right knee: Secondary | ICD-10-CM

## 2023-12-18 DIAGNOSIS — M5459 Other low back pain: Secondary | ICD-10-CM

## 2023-12-18 NOTE — Therapy (Signed)
 OUTPATIENT PHYSICAL THERAPY TREATMENT    Patient Name: Brandon Mendoza. MRN: [id number] DOB:Sep 03, 1940, 83 y.o., male Today's Date: 12/18/2023  END OF SESSION:  PT End of Session - 12/18/23 0846     Visit Number 48    Date for Recertification  01/17/24    Authorization Type Medicare    PT Start Time 0844    PT Stop Time 0924    PT Time Calculation (min) 40 min    Activity Tolerance Patient tolerated treatment well    Behavior During Therapy [facility] for tasks assessed/performed;Impulsive          Past Medical History:  Diagnosis Date   Acquired clawfoot, right foot 12/16/2021   Anxiety    Atrophy of calf muscles 10/21/2021   Duplex calves was negative for significant blockage EMG was done at [facility] MRI lumbar spine was also done at [facility]   Carotid artery occlusion    CKD (chronic kidney disease)    Stage 3   COPD (chronic obstructive pulmonary disease) (HCC)    Depression    Dysrhythmia    PAF, atrial tachycardia   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   The spinal cord appears normal. 2.   No spinal stenosis. 3.   Mild multilevel degenerative changes as detailed above that do not lead to spinal stenosis or nerve root compression. 4.   T2 hyperintense foci within the pons consistent with chronic microvascular ischemic changes.   Loop - Medtronic Linq 10/22/2020 10/22/2020   Nocturia    Paroxysmal atrial fibrillation (HCC)    Peripheral neuropathy    Peripheral vascular disease (HCC)    Sleep apnea    on 6 cm, nasal pillow   Spinal stenosis    Stroke (HCC) 05/01/2011   ischemic   Weakness generalized 10/21/2021   Severe, legs and arms   Past Surgical History:  Procedure Laterality Date   CAROTID ENDARTERECTOMY  12/09/11   Left cea    ENDARTERECTOMY  12/09/2011   Procedure: ENDARTERECTOMY CAROTID;  Surgeon: Krystal JULIANNA Doing, MD;  Location: [facility] OR;  Service: Vascular;  Laterality: Left;   EYE SURGERY     rt retina detachment   HAMMER TOE SURGERY  2004   HERNIA REPAIR  2006   I & D EXTREMITY Right 10/07/2022   Procedure: IRRIGATION AND DEBRIDEMENT EXTREMITY WITH WOUND CLOSURE;  Surgeon: Burnetta Aures, MD;  Location: [facility] ORS;  Service: Orthopedics;  Laterality: Right;   QUADRICEPS TENDON REPAIR Right 07/12/2022   Procedure: REPAIR QUADRICEP TENDON;  Surgeon: Ernie Cough, MD;  Location: [facility];  Service: Orthopedics;  Laterality: Right;   QUADRICEPS TENDON REPAIR Right 08/30/2022   Procedure: REPAIR QUADRICEP TENDON;  Surgeon: Ernie Cough, MD;  Location: [facility];  Service: Orthopedics;  Laterality: Right;   SHOULDER ARTHROSCOPY W/ ROTATOR CUFF REPAIR  2010   TONSILLECTOMY     Patient Active Problem List   Diagnosis Date Noted   Coronary artery calcification 05/09/2023   Rupture of quadriceps tendon 11/03/2022   Wound dehiscence 10/07/2022   Quadriceps tendon rupture, right, subsequent encounter 08/30/2022   Quadriceps tendon rupture, right, initial encounter 07/12/2022   Unable to care for self 07/08/2022   Dehydration 07/08/2022  Depression 07/08/2022   COPD (chronic obstructive pulmonary disease) (HCC) 07/08/2022   Sialoadenitis of submandibular gland 04/28/2022   Macular degeneration 03/01/2022   Myofascial pain 02/10/2022   Lumbar spondylosis 01/03/2022   Acquired clawfoot, left foot 12/16/2021   Acquired clawfoot, right foot 12/16/2021   Claw foot 12/14/2021   Gait abnormality 12/10/2021   Spinal stenosis at L4-L5 level 12/10/2021   Ataxia 11/22/2021   Lightheadedness 11/02/2021   Claudication of lower extremity (HCC) 10/21/2021   Atrophy of calf muscles 10/21/2021   Weakness generalized 10/21/2021   Focal neurological deficit 10/21/2021   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    Pain in joint of right hip 09/14/2021   High arches 07/22/2021   Hammertoes of both feet 07/22/2021   Neuropathy 07/22/2021   Constipation 05/28/2021   Disequilibrium 05/28/2021   History of colonic polyps 05/28/2021   Renal cyst 05/28/2021   Gastroesophageal reflux disease without esophagitis 05/05/2021   Ingrowing left great toenail 02/15/2021   S/P lumbar fusion 12/24/2020   Spinal stenosis of lumbar region 12/04/2020   Acquired complex renal cyst 11/23/2020   Functional gait abnormality 11/04/2020   Idiopathic neuropathy 11/02/2020   Tinea pedis of both feet 10/28/2020   Loop - Medtronic Linq 10/22/2020 10/22/2020   Benign hypertension with 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) 10/05/2020   Fall with injury 10/05/2020   Hematoma 09/16/2020   Normocytic anemia 09/14/2020   Leukocytosis 09/14/2020   AF (paroxysmal atrial fibrillation) (HCC) 09/14/2020   Chronic anticoagulation 09/14/2020   Hyponatremia 09/14/2020   Pain in left foot 01/22/2020   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   Pain in joint of right knee 12/27/2019   Recurrent falls 11/17/2019   Paroxysmal atrial fibrillation (HCC) 10/22/2019   Pain in right foot 10/14/2019   Falls 10/07/2019   Ataxia involving legs 08/12/2019   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   Urge incontinence 04/13/2018   Urinary urgency 04/13/2018   Pain in left knee 03/01/2018   Other intervertebral disc degeneration, lumbar region 06/16/2017   Acquired bilateral hammer toes 10/05/2015   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   Generalized anxiety disorder 05/12/2015   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   History of left-sided carotid endarterectomy 04/10/2014   Essential hypertension 04/10/2014   Occlusion and stenosis of carotid artery without mention of cerebral infarction 12/15/2011   Habitual alcohol use  12/01/2011   Carotid artery stenosis, symptomatic 11/30/2011   Cerebral infarction (HCC) 11/29/2011   Mixed hyperlipidemia     PCP: Bernardino Cone  REFERRING PROVIDER: Bernardino Cone  REFERRING DIAG: weakness, multiple falls  Rationale for Evaluation and Treatment: Rehabilitation  THERAPY DIAG:  Other abnormalities of gait and mobility  Repeated falls  Muscle weakness (generalized)  Contracture of muscle, multiple sites  Acute pain of right knee  Other low back pain  ONSET DATE: 10/07/22  SUBJECTIVE:  SUBJECTIVE STATEMENT: Patient reports no falls, he reports that he still has back pain with different moves and pain into the hip.  Patient asking about plan, he reports that he is worried that he will not be able to stay independent and live at home if he stops, he has no equipment and with the right knee's inability to extend and the left knee having issues with buckling due to neuropathy, he is worried about more falls and the strain on his wife   PERTINENT HISTORY:  Cerebral infarct 10/21/21, lumbar fusion, recurrent falls, has significant neuropathy and this is the reason for most of his falls   PAIN:  Are you having pain? Yes: NPRS scale: 0/10 Pain location: right knee Pain description: ache  Aggravating factors: being on my feet  Relieving factors: movement  PRECAUTIONS: Fall  WEIGHT BEARING RESTRICTIONS: No  FALLS:  Has patient fallen in last 6 months? Yes. Number of falls every few days   LIVING ENVIRONMENT: Lives with: lives with their spouse Lives in: House/apartment Stairs: Yes: Internal: 20 steps; on right going up Has following equipment at home: [name] - 4 wheeled, Wheelchair (manual), shower chair, Tour manager, Grab bars, Ramped entry, and trekking pole  OCCUPATION:  Retired  PLOF: Independent, prior to a year ago he was driving used trekking poles to walk  PATIENT GOALS: live at home  NEXT MD VISIT:   OBJECTIVE:   DIAGNOSTIC FINDINGS:    LUMBAR SPINE FINDINGS: 1. Wide laminectomy and posterior pedicle screw and rod fixation at L4-5 without residual or recurrent stenosis. 2. Progressive adjacent level disease at L3-4 with moderate right and mild left subarticular narrowing and moderate foraminal stenosis bilaterally. Progression is predominantly due to facet disease 3. Progressive moderate facet hypertrophy at L1-2 with a progressive rightward disc protrusion resulting in progressive moderate right foraminal stenosis. Asymmetric endplate marrow edema on the right at L1-2 is consistent with progressive disease as well. 4. Otherwise stable degenerative change without other focal stenosis.   THORACIC SPINE FINDINGS:  On sagittal views the vertebral bodies have normal height and alignment.  Hemangioma within T9 vertebral body and slightly within T10 vertebral body.  Anterior kyphosis.  Mild scoliosis convex right centered at T8 level. The spinal cord is normal in size and appearance. The paraspinal soft tissues are unremarkable.     On axial views there is no spinal stenosis or foraminal narrowing.  Limited views of the aorta, kidneys, liver, lungs and paraspinal muscles are notable for multiple large right renal cysts ranging in size from 1.5 to 7 cm.   COGNITION: Overall cognitive status: Within functional limits for tasks assessed     SENSATION: WFL  MUSCLE LENGTH: Hamstrings: mod tightness on both sides  POSTURE: rounded shoulders and forward head  LOWER EXTREMITY ROM:   the right knee has HS, has no quad tendon on the right, unable to lift the leg, left leg WFL's , left hip and right hip and ankles WFL's   LOWER EXTREMITY MMT: 4-/5 for the LE's except for the right knee extension UPPER EXTREMITY MMT:  4-/5 for shoulders and  elbows   FUNCTIONAL TESTS:  5XSTS: 83 seconds really has to use his hands and is unsteady with standing Cannot stand on his own without holding onto something due to poor balance   Transfers:  needs set up and MinA for safety, is impulsive and unsafe at times. GAIT: He is w/c dependent    TODAY'S TREATMENT:  DATE:  12/18/23 15# straight arm pulls 15# rows 15# chest press 25# triceps 25# biceps Leg curls 45# 10# leg extension In Pbars 2.5# marches with PT blocking knees  Passive stretch right knee ext Feet on ball K2C, rotation, bridge, isometric abs   12/13/23 15# rows 15# straight arm pulls 20# biceps 20# triceps 10# chest press In Pbars, worked on standing, then some marching 3 sets 15 each leg with PT blocking the stance leg Passive stretch right knee into extension Feet on ball K2C, rotation, bridge, isometric abs Black tband clamshells Black tband hip extension supine Ball b/n knees squeeze with bottom raise  12/05/23 15# rows 15# straight arm pulls 45# leg curls the right only 20# LEft leg ext 10# 10# chest press 20# triceps  20# biceps Passive stretch of the right knee, passive HS and piriformis stretch bilaterally Feet on ball K2C, rotation, bridge, isometric abs Black tband clamshells  11/28/23 Standing in Pbars PT blocking knees weight shifts and marches 15# rows 15# straight arm pulls 20# triceps 20# biceps Leg press 50# Feet on ball K2C, rotation, bridges, isometric abs Black tband clamshess Ball squeeze with bridge   11/22/23 Pbars standing and marching with PT blocking knees Leg curls 45# 2x15, then right only 20# 2x15 Leg extension 10# left only 2 x 15 15# rows on pulleys 15# straight arm pulls 20# biceps 20# overhead triceps press 10# chest press Feet on ball K2C, rotation, bridge, isometric abs Passive right  knee extension Black tband clamshells Ball b/n knees squeeze with bridge  11/14/23 Standing in the Pbars marching with PT blocking knees, toe raises 45# HS curls, then right only 20# 10# left only knee extension 15# rows 15# straight arm pulls Feet on ball K2C, rotation, bridges and isometric abs PAssive right knee extension, left piriformis stretch Black tband clamshells Ball b/n knees squeeze with bridge Asked him to give himself time after the nustep for his legs to recover before transfers   PATIENT EDUCATION:  Education details: POC Person educated: Patient Education method: Explanation Education comprehension: verbalized understanding  HOME EXERCISE PROGRAM:  ASSESSMENT:  CLINICAL IMPRESSION:   I continue to work with Mr Purdum regarding his current level and his ability to stay home and live independently with his wife.  He has had about 3 falls this last period.  The good thing is most can be explained with poor planning or set up, the bad is he is still impulsive and at times unsafe,  The other good thing is he has been able to get up into a chair on his own,  His wife has not had to call EMS or hurt herself with this, we started again working on transferring both directions.  As he feels he can only go to one side and this becomes awkward when he turns completely around putting more stress and time on bad legs which increase his risk for falls.  WE continue to work on his overall health and hopefully his safety with transfers.    HE is doing the Nustep at home he is still having some hip pain, he does report not taking my advice fully about taking days off.  He still is unsafe at time with the transfers especially when he is fatigued, he is still impulsive and goes too fast at times. The right leg has no quad tendon and the left leg has neuropathy so he is at a very high risk for falls and for regression as he could not exercise  much on his own and his wife cannot help him with  the standing I feel CGA and blocking the knees are needed due to at any time the knees can buckle, we have tried to teach him to do hip extension to keep the knee locked but he does not do this.  We have set up the maintenance program following medicare guidelines.  He qualifies for this due to the inability to be safe, has had multiple falls and is impulsive, tends to think he can do more than he can.  Patient has not had a fall in the past 5 weeks, he remains very unsafe at times and with any transfer needs min A or close CGA.  Tends to not understand that the right knee has no quad to hold the leg straight.  I  In the long run he is impulsive and unsafe and cannot exercise on his own to maintain his level, he is at a high risk for falls and there is no gym that has the ability to assist him. Session completed at mat table to eliminate trunk support for improved core stabilization. Core weakness with seated OHP.  Pt did report a fall yesterday stated he was standing pulling his pants up, I advised pt that he shouldn't put weight in his RLE, He ws adamant that he could put a little on it.     Justifying Skilled Maintenance for Rehab Services:   The goal of a skilled maintenance therapy program is to maintain the patient's current functional status or to prevent or slow deterioration. If a patient who is receiving restorative therapy then requires skilled maintenance therapy based on the skills and judgment of the therapist, development of a maintenance program would occur during the last visit for restorative treatment.    Does the patient need treatment that necessitates skilled services that can only be provided by a skilled provider? Can a caregiver provide these services why or why not? 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  He is impulsive and at times very unsafe he could not do this on his own and his wife could not help him., history of COPD, CKD, depression, HTN, kyphosis, a-fib, PVD, spinal stenosis, and stroke as well as RC repair.  These limit the patient's independent carryover with PT  exercises and require max cueing for optimal performance of safety and to strengthen other mms for his future in therapy sessions.    The patient requires the skill of PT to help with ongoing exercise and mobility to prevent rapid and significant decline in functional mobility, strength, and to limit falls with his safety.  Therefore, continued skilled maintenance therapy is recommended for this patient.    OBJECTIVE IMPAIRMENTS: Abnormal gait, cardiopulmonary status limiting activity, decreased activity tolerance, decreased balance, decreased coordination, decreased endurance, decreased mobility, difficulty walking, decreased ROM, decreased strength, improper body mechanics, postural dysfunction, and poor safety.   REHAB POTENTIAL: Fair dependent on carry over and patient compliance  CLINICAL DECISION MAKING: Evolving/moderate complexity  EVALUATION COMPLEXITY: Moderate  GOALS: Goals for skilled maintenance:     Long Term Goals: Target Date 12/13/23     The patient will preserve functional mobility within the home environment. Baseline: uses a w/c  Goal Status: Progressing 11/14/23   2. The patient will maintain strength for transfers.            Baseline: able to perform with set up and at times requires mod A and at other time may need Max A due to poor safety and impulsiveness            Goal Status: progressing 11/14/23   3. The patient will minimize fall frequency.            Baseline: he has been having about a fall a month            Goal Status: ongoing 11/14/23   4. Safe with HEP at home and with equipment he has            Baseline: does HEP             Goal Status:  progressing 11/14/23   PLAN:  PT FREQUENCY: 1x/week  PT DURATION: 12 weeks  PLANNED INTERVENTIONS: Therapeutic exercises, Therapeutic activity, Neuromuscular re-education, Balance training, Gait training, Patient/Family education, Self Care, Joint mobilization, Stair training, Cryotherapy, Moist heat, and Manual therapy.  PLAN FOR NEXT SESSION: Have sat up the maintenance program for Mr. Prestwood and progressing with this, biggest issue he is unsafe and cannot do on his own or with his wife.  Trying to maintain his level of independence of living at home with wife and safety  OBADIAH OZELL ORN, PT

## 2023-12-18 NOTE — Progress Notes (Signed)
 Remote Loop Recorder Transmission

## 2023-12-20 NOTE — Progress Notes (Unsigned)
 Cardiology Office Note   Date:  12/21/2023  ID:  Brandon Wager., DOB [birth year], MRN [id number] PCP: Jesus Bernardino MATSU, MD  [facility] Providers Cardiologist:  Gordy Bergamo, MD   History of Present Illness Brandon Mendoza. is a 84 y.o. male with a past medical history of hypertension, hyperlipidemia, remote stroke with left carotid endarterectomy (Dr. Oris), OSA on CPAP, remote tobacco use disorder, gait instability and frequent falls related to spinal stenosis and peripheral neuropathy, paroxysmal atrial fibrillation (not on anticoagulation due to spontaneous muscle hematoma and frequent falls) here for follow-up appointment.  He underwent loop recorder implantation for evaluation of bradycardia and frequent falls 10/22/2020.  He was seen by Dr. Bergamo January of last year and his main issue continues to be weakness of the lower extremities and muscle weakness.  No chest pain or dyspnea.. . Today, he presents with a hx of atrial fibrillation who presents with concerns about medication side effects and loop recorder monitoring.  He has had a loop recorder implanted for over three years to monitor for arrhythmias following a stroke in 2013. He experiences side effects from flecainide , including double vision, dizziness, blurred vision, difficulty focusing, seeing spots, and nausea for the past two years. Adjusting his glasses with prisms has not alleviated the symptoms. He also feels dizzy, tired, and weak, especially when moving or exercising. He takes 100 mg tablets of flecainide , which he breaks in half, taking 50 mg twice daily. He underwent a left carotid endarterectomy in 2013.  He ishaving double vision, dizziness, ect   50 BID   Wants to get off the flecainide .   Dr. Thedora will be PCP  Lipid panel  ROS: Pertinent ROS in HPI  Studies Reviewed      Echocardiogram 11/30/2011: Normal LV systolic function, EF 60 to 65%.  No significant valvular abnormality.  Normal left  atrial size.   Carotid artery duplex 06/15/2021: Minimal stenosis in the right carotid artery of 1 to 39%, left carotid endarterectomy site widely patent.  Antegrade vertebral artery flow.  Normal flow dynamics in bilateral subclavian arterie  Risk Assessment/Calculations   CHA2DS2-VASc Score = 5   This indicates a 7.2% annual risk of stroke. The patient's score is based upon: CHF History: 0 HTN History: 1 Diabetes History: 0 Stroke History: 2 Vascular Disease History: 0 Age Score: 2 Gender Score: 0      Physical Exam VS:  BP 122/64   Pulse 80   Ht 5' 8 (1.727 m)   Wt 184 lb (83.5 kg)   SpO2 97%   BMI 27.98 kg/m        Wt Readings from Last 3 Encounters:  12/21/23 184 lb (83.5 kg)  07/05/23 190 lb (86.2 kg)  03/24/23 192 lb 9.6 oz (87.4 kg)    GEN: Well nourished, well developed in no acute distress NECK: No JVD; No carotid bruits CARDIAC: IRIR, no murmurs, rubs, gallops RESPIRATORY:  Clear to auscultation without rales, wheezing or rhonchi  ABDOMEN: Soft, non-tender, non-distended EXTREMITIES:  No edema; No deformity   ASSESSMENT AND PLAN  Paroxysmal atrial fibrillation Managed with flecainide  since 2017. Reports significant side effects. No recent episodes on loop recorder. Discussed risks of abrupt discontinuation. Plan to explore alternative medications with electrophysiologist. - Contact Dr. Bergamo and Dr. Kennyth for alternative medication discussion and weaning schedule. - Refill flecainide   - Continue loop recorder monitoring.  Essential hypertension Blood pressure well-controlled with losartan  and hydrochlorothiazide .  Pure hypercholesterolemia Cholesterol well-controlled. No issues  with rosuvastatin . - Request lipid panel during November appointment with Dr. Elspeth Simpler.     Dispo: He can follow-up in 6 months with Dr. Kennyth in a year with Dr. Ladona  Signed, Brandon LOISE Fabry, PA-C

## 2023-12-21 ENCOUNTER — Ambulatory Visit: Attending: Physician Assistant | Admitting: Physician Assistant

## 2023-12-21 VITALS — BP 122/64 | HR 80 | Ht 68.0 in | Wt 184.0 lb

## 2023-12-21 DIAGNOSIS — E7800 Pure hypercholesterolemia, unspecified: Secondary | ICD-10-CM | POA: Diagnosis not present

## 2023-12-21 DIAGNOSIS — Z9889 Other specified postprocedural states: Secondary | ICD-10-CM | POA: Diagnosis present

## 2023-12-21 DIAGNOSIS — I480 Paroxysmal atrial fibrillation: Secondary | ICD-10-CM | POA: Diagnosis present

## 2023-12-21 DIAGNOSIS — I10 Essential (primary) hypertension: Secondary | ICD-10-CM | POA: Insufficient documentation

## 2023-12-21 NOTE — Progress Notes (Signed)
 Remote Loop Recorder Transmission

## 2023-12-21 NOTE — Patient Instructions (Signed)
 Medication Instructions:  Your physician recommends that you continue on your current medications as directed. Please refer to the Current Medication list given to you today.  *If you need a refill on your cardiac medications before your next appointment, please call your pharmacy*  Lab Work: Lipids in one month with PCP If you have labs (blood work) drawn today and your tests are completely normal, you will receive your results only by: MyChart Message (if you have MyChart) OR A paper copy in the mail If you have any lab test that is abnormal or we need to change your treatment, we will call you to review the results.   Follow-Up: At [facility], you and your health needs are our priority.  As part of our continuing mission to provide you with exceptional heart care, our providers are all part of one team.  This team includes your primary Cardiologist (physician) and Advanced Practice Providers or APPs (Physician Assistants and Nurse Practitioners) who all work together to provide you with the care you need, when you need it.  Your next appointment:   1 year(s)  Provider:   Gordy Bergamo, MD

## 2023-12-25 ENCOUNTER — Encounter: Payer: Self-pay | Admitting: Cardiology

## 2023-12-25 ENCOUNTER — Encounter

## 2023-12-25 ENCOUNTER — Encounter: Payer: Self-pay | Admitting: Internal Medicine

## 2023-12-26 ENCOUNTER — Encounter: Payer: Self-pay | Admitting: Physical Therapy

## 2023-12-26 ENCOUNTER — Ambulatory Visit: Admitting: Physical Therapy

## 2023-12-26 DIAGNOSIS — R2689 Other abnormalities of gait and mobility: Secondary | ICD-10-CM

## 2023-12-26 DIAGNOSIS — M5459 Other low back pain: Secondary | ICD-10-CM

## 2023-12-26 DIAGNOSIS — M25561 Pain in right knee: Secondary | ICD-10-CM

## 2023-12-26 DIAGNOSIS — R296 Repeated falls: Secondary | ICD-10-CM

## 2023-12-26 DIAGNOSIS — M6281 Muscle weakness (generalized): Secondary | ICD-10-CM

## 2023-12-26 DIAGNOSIS — M6249 Contracture of muscle, multiple sites: Secondary | ICD-10-CM

## 2023-12-26 NOTE — Telephone Encounter (Signed)
 He has no reason to wean him off of flecainide .  He can stop.

## 2023-12-26 NOTE — Telephone Encounter (Signed)
 Per Dr. Ladona he can stop the flecainide .  Please have him follow-up with Dr. Kennyth.  Thanks. Orren LOISE Fabry, PA-C

## 2023-12-26 NOTE — Therapy (Signed)
 OUTPATIENT PHYSICAL THERAPY TREATMENT    Patient Name: Brandon Mendoza. MRN: [id number] DOB:Apr 14, 1940, 83 y.o., male Today's Date: 12/26/2023  END OF SESSION:  PT End of Session - 12/26/23 0854     Visit Number 49    Date for Recertification  01/17/24    Authorization Type Medicare    PT Start Time 0841    PT Stop Time 0927    PT Time Calculation (min) 46 min    Activity Tolerance Patient tolerated treatment well    Behavior During Therapy [facility] for tasks assessed/performed;Impulsive          Past Medical History:  Diagnosis Date   Acquired clawfoot, right foot 12/16/2021   Anxiety    Atrophy of calf muscles 10/21/2021   Duplex calves was negative for significant blockage EMG was done at [facility] MRI lumbar spine was also done at [facility]   Carotid artery occlusion    CKD (chronic kidney disease)    Stage 3   COPD (chronic obstructive pulmonary disease) (HCC)    Depression    Dysrhythmia    PAF, atrial tachycardia   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   The spinal cord appears normal. 2.   No spinal stenosis. 3.   Mild multilevel degenerative changes as detailed above that do not lead to spinal stenosis or nerve root compression. 4.   T2 hyperintense foci within the pons consistent with chronic microvascular ischemic changes.   Loop - Medtronic Linq 10/22/2020 10/22/2020   Nocturia    Paroxysmal atrial fibrillation (HCC)    Peripheral neuropathy    Peripheral vascular disease    Sleep apnea    on 6 cm, nasal pillow   Spinal stenosis    Stroke (HCC) 05/01/2011   ischemic   Weakness generalized 10/21/2021   Severe, legs and arms   Past Surgical History:  Procedure Laterality Date   CAROTID ENDARTERECTOMY  12/09/11   Left cea    ENDARTERECTOMY  12/09/2011   Procedure: ENDARTERECTOMY CAROTID;  Surgeon: Krystal JULIANNA Doing, MD;  Location: [facility];  Service: Vascular;  Laterality: Left;   EYE SURGERY     rt retina detachment   HAMMER TOE SURGERY  2004   HERNIA REPAIR  2006   I & D EXTREMITY Right 10/07/2022   Procedure: IRRIGATION AND DEBRIDEMENT EXTREMITY WITH WOUND CLOSURE;  Surgeon: Burnetta Aures, MD;  Location: [facility] OR[facility];  Service: Orthopedics;  Laterality: Right;   QUADRICEPS TENDON REPAIR Right 07/12/2022   Procedure: REPAIR QUADRICEP TENDON;  Surgeon: Ernie Cough, MD;  Location: [facility];  Service: Orthopedics;  Laterality: Right;   QUADRICEPS TENDON REPAIR Right 08/30/2022   Procedure: REPAIR QUADRICEP TENDON;  Surgeon: Ernie Cough, MD;  Location: [facility];  Service: Orthopedics;  Laterality: Right;   SHOULDER ARTHROSCOPY W/ ROTATOR CUFF REPAIR  2010   TONSILLECTOMY     Patient Active Problem List   Diagnosis Date Noted   Coronary artery calcification 05/09/2023   Rupture of quadriceps tendon 11/03/2022   Wound dehiscence 10/07/2022   Quadriceps tendon rupture, right, subsequent encounter 08/30/2022   Quadriceps tendon rupture, right, initial encounter 07/12/2022   Unable to care for self 07/08/2022   Dehydration 07/08/2022   Depression  07/08/2022   COPD (chronic obstructive pulmonary disease) (HCC) 07/08/2022   Sialoadenitis of submandibular gland 04/28/2022   Macular degeneration 03/01/2022   Myofascial pain 02/10/2022   Lumbar spondylosis 01/03/2022   Acquired clawfoot, left foot 12/16/2021   Acquired clawfoot, right foot 12/16/2021   Claw foot 12/14/2021   Gait abnormality 12/10/2021   Spinal stenosis at L4-L5 level 12/10/2021   Ataxia 11/22/2021   Lightheadedness 11/02/2021   Claudication of lower extremity 10/21/2021   Atrophy of calf muscles 10/21/2021   Weakness generalized 10/21/2021   Focal neurological deficit 10/21/2021   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   Pain  in joint of right hip 09/14/2021   High arches 07/22/2021   Hammertoes of both feet 07/22/2021   Neuropathy 07/22/2021   Constipation 05/28/2021   Disequilibrium 05/28/2021   History of colonic polyps 05/28/2021   Renal cyst 05/28/2021   Gastroesophageal reflux disease without esophagitis 05/05/2021   Ingrowing left great toenail 02/15/2021   S/P lumbar fusion 12/24/2020   Spinal stenosis of lumbar region 12/04/2020   Acquired complex renal cyst 11/23/2020   Functional gait abnormality 11/04/2020   Idiopathic neuropathy 11/02/2020   Tinea pedis of both feet 10/28/2020   Loop - Medtronic Linq 10/22/2020 10/22/2020   Benign hypertension with 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) 10/05/2020   Fall with injury 10/05/2020   Hematoma 09/16/2020   Normocytic anemia 09/14/2020   Leukocytosis 09/14/2020   AF (paroxysmal atrial fibrillation) (HCC) 09/14/2020   Chronic anticoagulation 09/14/2020   Hyponatremia 09/14/2020   Pain in left foot 01/22/2020   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   Pain in joint of right knee 12/27/2019   Recurrent falls 11/17/2019   Paroxysmal atrial fibrillation (HCC) 10/22/2019   Pain in right foot 10/14/2019   Falls 10/07/2019   Ataxia involving legs 08/12/2019   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   Urge incontinence 04/13/2018   Urinary urgency 04/13/2018   Pain in left knee 03/01/2018   Other intervertebral disc degeneration, lumbar region 06/16/2017   Acquired bilateral hammer toes 10/05/2015   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   Generalized anxiety disorder 05/12/2015   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   History of left-sided carotid endarterectomy 04/10/2014   Essential hypertension 04/10/2014   Occlusion and stenosis of carotid artery without mention of cerebral infarction 12/15/2011   Habitual alcohol use 12/01/2011   Carotid  artery stenosis, symptomatic 11/30/2011   Cerebral infarction (HCC) 11/29/2011   Mixed hyperlipidemia     PCP: Bernardino Cone  REFERRING PROVIDER: Bernardino Cone  REFERRING DIAG: weakness, multiple falls  Rationale for Evaluation and Treatment: Rehabilitation  THERAPY DIAG:  Other abnormalities of gait and mobility  Repeated falls  Muscle weakness (generalized)  Contracture of muscle, multiple sites  Acute pain of right knee  Other low back pain  ONSET DATE: 10/07/22  SUBJECTIVE:  SUBJECTIVE STATEMENT: Patient reports no falls,  Patient asking about plan, he reports that he is worried that he will not be able to stay independent and live at home if he stops, he has no equipment and with the right knee's inability to extend and the left knee having issues with buckling due to neuropathy, he is worried about more falls and the strain on his wife   PERTINENT HISTORY:  Cerebral infarct 10/21/21, lumbar fusion, recurrent falls, has significant neuropathy and this is the reason for most of his falls   PAIN:  Are you having pain? Yes: NPRS scale: 0/10 Pain location: right knee Pain description: ache  Aggravating factors: being on my feet  Relieving factors: movement  PRECAUTIONS: Fall  WEIGHT BEARING RESTRICTIONS: No  FALLS:  Has patient fallen in last 6 months? Yes. Number of falls every few days   LIVING ENVIRONMENT: Lives with: lives with their spouse Lives in: House/apartment Stairs: Yes: Internal: 20 steps; on right going up Has following equipment at home: [name] - 4 wheeled, Wheelchair (manual), shower chair, Tour manager, Grab bars, Ramped entry, and trekking pole  OCCUPATION: Retired  PLOF: Independent, prior to a year ago he was driving used trekking poles to walk  PATIENT  GOALS: live at home  NEXT MD VISIT:   OBJECTIVE:   DIAGNOSTIC FINDINGS:    LUMBAR SPINE FINDINGS: 1. Wide laminectomy and posterior pedicle screw and rod fixation at L4-5 without residual or recurrent stenosis. 2. Progressive adjacent level disease at L3-4 with moderate right and mild left subarticular narrowing and moderate foraminal stenosis bilaterally. Progression is predominantly due to facet disease 3. Progressive moderate facet hypertrophy at L1-2 with a progressive rightward disc protrusion resulting in progressive moderate right foraminal stenosis. Asymmetric endplate marrow edema on the right at L1-2 is consistent with progressive disease as well. 4. Otherwise stable degenerative change without other focal stenosis.   THORACIC SPINE FINDINGS:  On sagittal views the vertebral bodies have normal height and alignment.  Hemangioma within T9 vertebral body and slightly within T10 vertebral body.  Anterior kyphosis.  Mild scoliosis convex right centered at T8 level. The spinal cord is normal in size and appearance. The paraspinal soft tissues are unremarkable.     On axial views there is no spinal stenosis or foraminal narrowing.  Limited views of the aorta, kidneys, liver, lungs and paraspinal muscles are notable for multiple large right renal cysts ranging in size from 1.5 to 7 cm.   COGNITION: Overall cognitive status: Within functional limits for tasks assessed     SENSATION: WFL  MUSCLE LENGTH: Hamstrings: mod tightness on both sides  POSTURE: rounded shoulders and forward head  LOWER EXTREMITY ROM:   the right knee has HS, has no quad tendon on the right, unable to lift the leg, left leg WFL's , left hip and right hip and ankles WFL's   LOWER EXTREMITY MMT: 4-/5 for the LE's except for the right knee extension UPPER EXTREMITY MMT:  4-/5 for shoulders and elbows   FUNCTIONAL TESTS:  5XSTS: 83 seconds really has to use his hands and is unsteady with  standing Cannot stand on his own without holding onto something due to poor balance   Transfers:  needs set up and MinA for safety, is impulsive and unsafe at times. GAIT: He is w/c dependent    TODAY'S TREATMENT:  DATE:  12/26/23 In Pbars 2.5# marches 2.5# hip abduction 2x15 15# rows 15# # straight arm pulls 10# chest press 25# triceps 25# biceps 45# leg curls 10# leg ext LEg press 50#, then left only no weight Feet on ball K2C, rotation, bridge and isometric abs  12/18/23 15# straight arm pulls 15# rows 15# chest press 25# triceps 25# biceps Leg curls 45# 10# leg extension In Pbars 2.5# marches with PT blocking knees  Passive stretch right knee ext Feet on ball K2C, rotation, bridge, isometric abs   12/13/23 15# rows 15# straight arm pulls 20# biceps 20# triceps 10# chest press In Pbars, worked on standing, then some marching 3 sets 15 each leg with PT blocking the stance leg Passive stretch right knee into extension Feet on ball K2C, rotation, bridge, isometric abs Black tband clamshells Black tband hip extension supine Ball b/n knees squeeze with bottom raise  12/05/23 15# rows 15# straight arm pulls 45# leg curls the right only 20# LEft leg ext 10# 10# chest press 20# triceps  20# biceps Passive stretch of the right knee, passive HS and piriformis stretch bilaterally Feet on ball K2C, rotation, bridge, isometric abs Black tband clamshells  11/28/23 Standing in Pbars PT blocking knees weight shifts and marches 15# rows 15# straight arm pulls 20# triceps 20# biceps Leg press 50# Feet on ball K2C, rotation, bridges, isometric abs Black tband clamshess Ball squeeze with bridge   11/22/23 Pbars standing and marching with PT blocking knees Leg curls 45# 2x15, then right only 20# 2x15 Leg extension 10# left only 2 x 15 15#  rows on pulleys 15# straight arm pulls 20# biceps 20# overhead triceps press 10# chest press Feet on ball K2C, rotation, bridge, isometric abs Passive right knee extension Black tband clamshells Ball b/n knees squeeze with bridge  11/14/23 Standing in the Pbars marching with PT blocking knees, toe raises 45# HS curls, then right only 20# 10# left only knee extension 15# rows 15# straight arm pulls Feet on ball K2C, rotation, bridges and isometric abs PAssive right knee extension, left piriformis stretch Black tband clamshells Ball b/n knees squeeze with bridge Asked him to give himself time after the nustep for his legs to recover before transfers   PATIENT EDUCATION:  Education details: POC Person educated: Patient Education method: Explanation Education comprehension: verbalized understanding  HOME EXERCISE PROGRAM:  ASSESSMENT:  CLINICAL IMPRESSION:   I continue to work with Brandon Mendoza regarding his current level and his ability to stay home and live independently with his wife.  He has had about 3 falls this last period.  The good thing is most can be explained with poor planning or set up, the bad is he is still impulsive and at times unsafe,  The other good thing is he has been able to get up into a chair on his own,  His wife has not had to call EMS or hurt herself with this, we started again working on transferring both directions.  As he feels he can only go to one side and this becomes awkward when he turns completely around putting more stress and time on bad legs which increase his risk for falls.  WE continue to work on his overall health and hopefully his safety with transfers.    HE is doing the Nustep at home he is still having some hip pain, he does report not taking my advice fully about taking days off.  He still is unsafe at time with the  transfers especially when he is fatigued, he is still impulsive and goes too fast at times. The right leg has no quad tendon  and the left leg has neuropathy so he is at a very high risk for falls and for regression as he could not exercise much on his own and his wife cannot help him with the standing I feel CGA and blocking the knees are needed due to at any time the knees can buckle, we have tried to teach him to do hip extension to keep the knee locked but he does not do this.  We have set up the maintenance program following medicare guidelines.  He qualifies for this due to the inability to be safe, has had multiple falls and is impulsive, tends to think he can do more than he can.  Patient has not had a fall in the past 5 weeks, he remains very unsafe at times and with any transfer needs min A or close CGA.  Tends to not understand that the right knee has no quad to hold the leg straight.  I  In the long run he is impulsive and unsafe and cannot exercise on his own to maintain his level, he is at a high risk for falls and there is no gym that has the ability to assist him. Session completed at mat table to eliminate trunk support for improved core stabilization. Core weakness with seated OHP.  Pt did report a fall yesterday stated he was standing pulling his pants up, I advised pt that he shouldn't put weight in his RLE, He ws adamant that he could put a little on it.     Justifying Skilled Maintenance for Rehab Services:   The goal of a skilled maintenance therapy program is to maintain the patient's current functional status or to prevent or slow deterioration. If a patient who is receiving restorative therapy then requires skilled maintenance therapy based on the skills and judgment of the therapist, development of a maintenance program would occur during the last visit for restorative treatment.    Does the patient need treatment that necessitates skilled services that can only be provided by a skilled provider? Can a caregiver provide these services why or why not? 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  He is impulsive and at times very unsafe he could not do this on his own and his wife could not help him., history of COPD, CKD, depression, HTN, kyphosis, a-fib, PVD, spinal stenosis, and stroke as well as RC repair.  These limit the patient's independent carryover with PT  exercises and require max cueing for optimal performance of safety and to strengthen other mms for his future in therapy sessions.    The patient requires the skill of PT to help with ongoing exercise and mobility to prevent rapid and significant decline in functional mobility, strength, and to limit falls with his safety.  Therefore, continued skilled maintenance therapy is recommended for this patient.    OBJECTIVE IMPAIRMENTS: Abnormal gait, cardiopulmonary status limiting activity, decreased activity tolerance, decreased balance, decreased coordination, decreased endurance, decreased mobility, difficulty walking, decreased ROM, decreased strength, improper body mechanics, postural dysfunction, and poor safety.   REHAB POTENTIAL: Fair dependent on carry over and patient compliance  CLINICAL DECISION  MAKING: Evolving/moderate complexity  EVALUATION COMPLEXITY: Moderate  GOALS: Goals for skilled maintenance:     Long Term Goals: Target Date 12/13/23     The patient will preserve functional mobility within the home environment. Baseline: uses a w/c            Goal Status: Progressing 11/14/23   2. The patient will maintain strength for transfers.            Baseline: able to perform with set up and at times requires mod A and at other time may need Max A due to poor safety and impulsiveness            Goal Status: progressing 11/14/23   3. The patient will minimize fall frequency.            Baseline: he has been having about a fall a  month            Goal Status: ongoing 11/14/23   4. Safe with HEP at home and with equipment he has            Baseline: does HEP             Goal Status: progressing 11/14/23   PLAN:  PT FREQUENCY: 1x/week  PT DURATION: 12 weeks  PLANNED INTERVENTIONS: Therapeutic exercises, Therapeutic activity, Neuromuscular re-education, Balance training, Gait training, Patient/Family education, Self Care, Joint mobilization, Stair training, Cryotherapy, Moist heat, and Manual therapy.  PLAN FOR NEXT SESSION: Have sat up the maintenance program for Brandon Mendoza and progressing with this, biggest issue he is unsafe and cannot do on his own or with his wife.  Trying to maintain his level of independence of living at home with wife and safety  OBADIAH OZELL ORN, PT

## 2024-01-01 ENCOUNTER — Encounter: Payer: Self-pay | Admitting: Physical Therapy

## 2024-01-01 ENCOUNTER — Ambulatory Visit: Attending: Internal Medicine | Admitting: Physical Therapy

## 2024-01-01 DIAGNOSIS — M25561 Pain in right knee: Secondary | ICD-10-CM | POA: Insufficient documentation

## 2024-01-01 DIAGNOSIS — R2689 Other abnormalities of gait and mobility: Secondary | ICD-10-CM | POA: Insufficient documentation

## 2024-01-01 DIAGNOSIS — M6249 Contracture of muscle, multiple sites: Secondary | ICD-10-CM | POA: Insufficient documentation

## 2024-01-01 DIAGNOSIS — M5459 Other low back pain: Secondary | ICD-10-CM | POA: Insufficient documentation

## 2024-01-01 DIAGNOSIS — M6281 Muscle weakness (generalized): Secondary | ICD-10-CM | POA: Diagnosis present

## 2024-01-01 DIAGNOSIS — R296 Repeated falls: Secondary | ICD-10-CM | POA: Insufficient documentation

## 2024-01-01 NOTE — Therapy (Signed)
 OUTPATIENT PHYSICAL THERAPY TREATMENT  Progress Note Reporting Period 10/31/23 to 01/01/24 for visits 41-50  See note below for Objective Data and Assessment of Progress/Goals.      Patient Name: Brandon Mendoza. MRN: [id number] DOB:Aug 09, 1940, 83 y.o., male Today's Date: 01/01/2024  END OF SESSION:  PT End of Session - 01/01/24 0856     Visit Number 50    Date for Recertification  01/17/24    Authorization Type Medicare    PT Start Time 0843    PT Stop Time 0928    PT Time Calculation (min) 45 min    Activity Tolerance Patient tolerated treatment well    Behavior During Therapy [facility] for tasks assessed/performed          Past Medical History:  Diagnosis Date   Acquired clawfoot, right foot 12/16/2021   Anxiety    Atrophy of calf muscles 10/21/2021   Duplex calves was negative for significant blockage EMG was done at [facility] MRI lumbar spine was also done at [facility]   Carotid artery occlusion    CKD (chronic kidney disease)    Stage 3   COPD (chronic obstructive pulmonary disease) (HCC)    Depression    Dysrhythmia    PAF, atrial tachycardia   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   The spinal cord appears normal. 2.   No spinal stenosis. 3.   Mild multilevel degenerative changes as detailed above that do not lead to spinal stenosis or nerve root compression. 4.   T2 hyperintense foci within the pons consistent with chronic microvascular ischemic changes.   Loop - Medtronic Linq 10/22/2020 10/22/2020   Nocturia    Paroxysmal atrial fibrillation (HCC)    Peripheral neuropathy    Peripheral vascular disease    Sleep apnea    on 6 cm, nasal pillow   Spinal stenosis    Stroke (HCC) 05/01/2011   ischemic   Weakness generalized 10/21/2021    Severe, legs and arms   Past Surgical History:  Procedure Laterality Date   CAROTID ENDARTERECTOMY  12/09/11   Left cea   ENDARTERECTOMY  12/09/2011   Procedure: ENDARTERECTOMY CAROTID;  Surgeon: Krystal JULIANNA Doing, MD;  Location: [facility] OR;  Service: Vascular;  Laterality: Left;   EYE SURGERY     rt retina detachment   HAMMER TOE SURGERY  2004   HERNIA REPAIR  2006   I & D EXTREMITY Right 10/07/2022   Procedure: IRRIGATION AND DEBRIDEMENT EXTREMITY WITH WOUND CLOSURE;  Surgeon: Burnetta Aures, MD;  Location: [facility] ORS;  Service: Orthopedics;  Laterality: Right;   QUADRICEPS TENDON REPAIR Right 07/12/2022   Procedure: REPAIR QUADRICEP TENDON;  Surgeon: Ernie Cough, MD;  Location: [facility] ORS;  Service: Orthopedics;  Laterality: Right;   QUADRICEPS TENDON REPAIR Right 08/30/2022   Procedure: REPAIR QUADRICEP TENDON;  Surgeon: Ernie Cough, MD;  Location: [facility];  Service: Orthopedics;  Laterality: Right;   SHOULDER ARTHROSCOPY W/ ROTATOR CUFF REPAIR  2010   TONSILLECTOMY     Patient Active Problem List   Diagnosis Date Noted   Coronary artery calcification 05/09/2023   Rupture of quadriceps tendon 11/03/2022   Wound dehiscence 10/07/2022   Quadriceps tendon rupture, right, subsequent encounter 08/30/2022  Quadriceps tendon rupture, right, initial encounter 07/12/2022   Unable to care for self 07/08/2022   Dehydration 07/08/2022   Depression 07/08/2022   COPD (chronic obstructive pulmonary disease) (HCC) 07/08/2022   Sialoadenitis of submandibular gland 04/28/2022   Macular degeneration 03/01/2022   Myofascial pain 02/10/2022   Lumbar spondylosis 01/03/2022   Acquired clawfoot, left foot 12/16/2021   Acquired clawfoot, right foot 12/16/2021   Claw foot 12/14/2021   Gait abnormality 12/10/2021   Spinal stenosis at L4-L5 level 12/10/2021   Ataxia 11/22/2021   Lightheadedness 11/02/2021   Claudication of lower extremity 10/21/2021   Atrophy of calf muscles 10/21/2021   Weakness generalized  10/21/2021   Focal neurological deficit 10/21/2021   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   Pain in joint of right hip 09/14/2021   High arches 07/22/2021   Hammertoes of both feet 07/22/2021   Neuropathy 07/22/2021   Constipation 05/28/2021   Disequilibrium 05/28/2021   History of colonic polyps 05/28/2021   Renal cyst 05/28/2021   Gastroesophageal reflux disease without esophagitis 05/05/2021   Ingrowing left great toenail 02/15/2021   S/P lumbar fusion 12/24/2020   Spinal stenosis of lumbar region 12/04/2020   Acquired complex renal cyst 11/23/2020   Functional gait abnormality 11/04/2020   Idiopathic neuropathy 11/02/2020   Tinea pedis of both feet 10/28/2020   Loop - Medtronic Linq 10/22/2020 10/22/2020   Benign hypertension with 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) 10/05/2020   Fall with injury 10/05/2020   Hematoma 09/16/2020   Normocytic anemia 09/14/2020   Leukocytosis 09/14/2020   AF (paroxysmal atrial fibrillation) (HCC) 09/14/2020   Chronic anticoagulation 09/14/2020   Hyponatremia 09/14/2020   Pain in left foot 01/22/2020   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   Pain in joint of right knee 12/27/2019   Recurrent falls 11/17/2019   Paroxysmal atrial fibrillation (HCC) 10/22/2019   Pain in right foot 10/14/2019   Falls 10/07/2019   Ataxia involving legs 08/12/2019   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   Urge incontinence 04/13/2018   Urinary urgency 04/13/2018   Pain in left knee 03/01/2018   Other intervertebral disc degeneration, lumbar region 06/16/2017   Acquired bilateral hammer toes 10/05/2015   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   Generalized anxiety disorder 05/12/2015   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   History of left-sided carotid endarterectomy 04/10/2014   Essential hypertension 04/10/2014    Occlusion and stenosis of carotid artery without mention of cerebral infarction 12/15/2011   Habitual alcohol use 12/01/2011   Carotid artery stenosis, symptomatic 11/30/2011   Cerebral infarction (HCC) 11/29/2011   Mixed hyperlipidemia     PCP: Bernardino Cone  REFERRING PROVIDER: Bernardino Cone  REFERRING DIAG: weakness, multiple falls  Rationale for Evaluation and Treatment: Rehabilitation  THERAPY DIAG:  Other abnormalities of gait and mobility  Repeated falls  Muscle weakness (generalized)  Contracture of muscle, multiple sites  Acute pain of right knee  Other low back pain  ONSET DATE: 10/07/22  SUBJECTIVE:  SUBJECTIVE STATEMENT: Patient reports no falls, doing okay.     Patient asking about plan, he reports that he is worried that he will not be able to stay independent and live at home if he stops, he has no equipment and with the right knee's inability to extend and the left knee having issues with buckling due to neuropathy, he is worried about more falls and the strain on his wife   PERTINENT HISTORY:  Cerebral infarct 10/21/21, lumbar fusion, recurrent falls, has significant neuropathy and this is the reason for most of his falls   PAIN:  Are you having pain? Yes: NPRS scale: 0/10 Pain location: right knee Pain description: ache  Aggravating factors: being on my feet  Relieving factors: movement  PRECAUTIONS: Fall  WEIGHT BEARING RESTRICTIONS: No  FALLS:  Has patient fallen in last 6 months? Yes. Number of falls every few days   LIVING ENVIRONMENT: Lives with: lives with their spouse Lives in: House/apartment Stairs: Yes: Internal: 20 steps; on right going up Has following equipment at home: [name] - 4 wheeled, Wheelchair (manual), shower chair, Tour manager, Grab  bars, Ramped entry, and trekking pole  OCCUPATION: Retired  PLOF: Independent, prior to a year ago he was driving used trekking poles to walk  PATIENT GOALS: live at home  NEXT MD VISIT:   OBJECTIVE:   DIAGNOSTIC FINDINGS:    LUMBAR SPINE FINDINGS: 1. Wide laminectomy and posterior pedicle screw and rod fixation at L4-5 without residual or recurrent stenosis. 2. Progressive adjacent level disease at L3-4 with moderate right and mild left subarticular narrowing and moderate foraminal stenosis bilaterally. Progression is predominantly due to facet disease 3. Progressive moderate facet hypertrophy at L1-2 with a progressive rightward disc protrusion resulting in progressive moderate right foraminal stenosis. Asymmetric endplate marrow edema on the right at L1-2 is consistent with progressive disease as well. 4. Otherwise stable degenerative change without other focal stenosis.   THORACIC SPINE FINDINGS:  On sagittal views the vertebral bodies have normal height and alignment.  Hemangioma within T9 vertebral body and slightly within T10 vertebral body.  Anterior kyphosis.  Mild scoliosis convex right centered at T8 level. The spinal cord is normal in size and appearance. The paraspinal soft tissues are unremarkable.     On axial views there is no spinal stenosis or foraminal narrowing.  Limited views of the aorta, kidneys, liver, lungs and paraspinal muscles are notable for multiple large right renal cysts ranging in size from 1.5 to 7 cm.   COGNITION: Overall cognitive status: Within functional limits for tasks assessed     SENSATION: WFL  MUSCLE LENGTH: Hamstrings: mod tightness on both sides  POSTURE: rounded shoulders and forward head  LOWER EXTREMITY ROM:   the right knee has HS, has no quad tendon on the right, unable to lift the leg, left leg WFL's , left hip and right hip and ankles WFL's   LOWER EXTREMITY MMT: 4-/5 for the LE's except for the right knee  extension UPPER EXTREMITY MMT:  4-/5 for shoulders and elbows   FUNCTIONAL TESTS:  5XSTS: 83 seconds really has to use his hands and is unsteady with standing Cannot stand on his own without holding onto something due to poor balance   Transfers:  needs set up and MinA for safety, is impulsive and unsafe at times. GAIT: He is w/c dependent    TODAY'S TREATMENT:  DATE:  01/01/24 In [facility] marching and hip abduction 2.5# with PT blocking knees Leg curls 45# 2x15 Leg extension 10# 2x15 15# straight arm pulls 15# rows 25# biceps 25# triceps 15# chest press Feet on ball K2C, rotation, bridge, isometric abs Black tband clamshells Black tband right hip supine extension All transfers require CGA  12/26/23 In [facility] 2.5# marches 2.5# hip abduction 2x15 15# rows 15# # straight arm pulls 10# chest press 25# triceps 25# biceps 45# leg curls 10# leg ext LEg press 50#, then left only no weight Feet on ball K2C, rotation, bridge and isometric abs  12/18/23 15# straight arm pulls 15# rows 15# chest press 25# triceps 25# biceps Leg curls 45# 10# leg extension In [facility] 2.5# marches with PT blocking knees  Passive stretch right knee ext Feet on ball K2C, rotation, bridge, isometric abs   12/13/23 15# rows 15# straight arm pulls 20# biceps 20# triceps 10# chest press In Pbars, worked on standing, then some marching 3 sets 15 each leg with PT blocking the stance leg Passive stretch right knee into extension Feet on ball K2C, rotation, bridge, isometric abs Black tband clamshells Black tband hip extension supine Ball b/n knees squeeze with bottom raise  12/05/23 15# rows 15# straight arm pulls 45# leg curls the right only 20# LEft leg ext 10# 10# chest press 20# triceps  20# biceps Passive stretch of the right knee, passive HS and piriformis  stretch bilaterally Feet on ball K2C, rotation, bridge, isometric abs Black tband clamshells  11/28/23 Standing in Pbars PT blocking knees weight shifts and marches 15# rows 15# straight arm pulls 20# triceps 20# biceps Leg press 50# Feet on ball K2C, rotation, bridges, isometric abs Black tband clamshess Ball squeeze with bridge   11/22/23 Pbars standing and marching with PT blocking knees Leg curls 45# 2x15, then right only 20# 2x15 Leg extension 10# left only 2 x 15 15# rows on pulleys 15# straight arm pulls 20# biceps 20# overhead triceps press 10# chest press Feet on ball K2C, rotation, bridge, isometric abs Passive right knee extension Black tband clamshells Ball b/n knees squeeze with bridge  11/14/23 Standing in the [facility] marching with PT blocking knees, toe raises 45# HS curls, then right only 20# 10# left only knee extension 15# rows 15# straight arm pulls Feet on ball K2C, rotation, bridges and isometric abs PAssive right knee extension, left piriformis stretch Black tband clamshells Ball b/n knees squeeze with bridge Asked him to give himself time after the nustep for his legs to recover before transfers   PATIENT EDUCATION:  Education details: POC Person educated: Patient Education method: Explanation Education comprehension: verbalized understanding  HOME EXERCISE PROGRAM:  ASSESSMENT:  CLINICAL IMPRESSION:   I continue to work with Brandon Mendoza regarding his current level and his ability to stay home and live independently with his wife.  I continue to work on his strength LE, UE and core to help him stay living at home with his wife.  All transfers require CGA and at times a min A due to being unsafe  WE continue to work on his overall health and hopefully his safety with transfers.    HE is doing the Nustep at home he is still having some hip pain, he does report not taking my advice fully about taking days off.  He still is unsafe at time with the  transfers especially when he is fatigued, he is still impulsive and goes too fast at times. The right  leg has no quad tendon and the left leg has neuropathy so he is at a very high risk for falls and for regression as he could not exercise much on his own and his wife cannot help him with the standing I feel CGA and blocking the knees are needed due to at any time the knees can buckle, we have tried to teach him to do hip extension to keep the knee locked but he does not do this.  We have set up the maintenance program following medicare guidelines.  He qualifies for this due to the inability to be safe, has had multiple falls and is impulsive, tends to think he can do more than he can.  Patient has not had a fall in the past 5 weeks, he remains very unsafe at times and with any transfer needs min A or close CGA.  Tends to not understand that the right knee has no quad to hold the leg straight.  I  In the long run he is impulsive and unsafe and cannot exercise on his own to maintain his level, he is at a high risk for falls and there is no gym that has the ability to assist him. Session completed at mat table to eliminate trunk support for improved core stabilization. Core weakness with seated OHP.  Pt did report a fall yesterday stated he was standing pulling his pants up, I advised pt that he shouldn't put weight in his RLE, He ws adamant that he could put a little on it.     Justifying Skilled Maintenance for Rehab Services:   The goal of a skilled maintenance therapy program is to maintain the patient's current functional status or to prevent or slow deterioration. If a patient who is receiving restorative therapy then requires skilled maintenance therapy based on the skills and judgment of the therapist, development of a maintenance program would occur during the last visit for restorative treatment.    Does the patient need treatment that necessitates skilled services that can only be provided by a  skilled provider? Can a caregiver provide these services why or why not? 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  He is impulsive and at times very unsafe he could not do this on his own and his wife could not help him., history of COPD, CKD, depression, HTN, kyphosis, a-fib, PVD, spinal stenosis, and stroke as well as RC repair.  These limit the patient's independent carryover with PT  exercises and require max cueing for optimal performance of safety and to strengthen other mms for his future in therapy sessions.    The patient requires the skill of PT to help with ongoing exercise and mobility to prevent rapid and significant decline in functional mobility, strength, and to limit falls with his safety.  Therefore, continued skilled maintenance therapy is recommended for this patient.    OBJECTIVE IMPAIRMENTS: Abnormal gait, cardiopulmonary status limiting activity, decreased activity tolerance, decreased balance, decreased coordination, decreased endurance, decreased mobility, difficulty walking, decreased ROM, decreased strength, improper body mechanics, postural dysfunction, and poor safety.   REHAB POTENTIAL: Fair dependent on carry over and patient compliance  CLINICAL DECISION MAKING: Evolving/moderate complexity  EVALUATION COMPLEXITY: Moderate  GOALS: Goals for skilled maintenance:     Long Term  Goals: Target Date 12/13/23     The patient will preserve functional mobility within the home environment. Baseline: uses a w/c            Goal Status: Progressing 11/14/23   2. The patient will maintain strength for transfers.            Baseline: able to perform with set up and at times requires mod A and at other time may need Max A due to poor safety and impulsiveness            Goal Status:  progressing 11/14/23   3. The patient will minimize fall frequency.            Baseline: he has been having about a fall a month            Goal Status: ongoing 11/14/23   4. Safe with HEP at home and with equipment he has            Baseline: does HEP             Goal Status: progressing 11/14/23   PLAN:  PT FREQUENCY: 1x/week  PT DURATION: 12 weeks  PLANNED INTERVENTIONS: Therapeutic exercises, Therapeutic activity, Neuromuscular re-education, Balance training, Gait training, Patient/Family education, Self Care, Joint mobilization, Stair training, Cryotherapy, Moist heat, and Manual therapy.  PLAN FOR NEXT SESSION: Have sat up the maintenance program for Brandon. Mendoza and progressing with this, biggest issue he is unsafe and cannot do on his own or with his wife.  Trying to maintain his level of independence of living at home with wife and safety  OBADIAH OZELL ORN, PT

## 2024-01-03 NOTE — Progress Notes (Signed)
 Remote Loop Recorder Transmission

## 2024-01-08 ENCOUNTER — Ambulatory Visit: Admitting: Physical Therapy

## 2024-01-08 ENCOUNTER — Encounter: Payer: Self-pay | Admitting: Physical Therapy

## 2024-01-08 DIAGNOSIS — M6249 Contracture of muscle, multiple sites: Secondary | ICD-10-CM

## 2024-01-08 DIAGNOSIS — R296 Repeated falls: Secondary | ICD-10-CM

## 2024-01-08 DIAGNOSIS — M5459 Other low back pain: Secondary | ICD-10-CM

## 2024-01-08 DIAGNOSIS — M25561 Pain in right knee: Secondary | ICD-10-CM

## 2024-01-08 DIAGNOSIS — R2689 Other abnormalities of gait and mobility: Secondary | ICD-10-CM | POA: Diagnosis not present

## 2024-01-08 DIAGNOSIS — M6281 Muscle weakness (generalized): Secondary | ICD-10-CM

## 2024-01-08 NOTE — Therapy (Signed)
 OUTPATIENT PHYSICAL THERAPY TREATMENT  Progress Note Reporting Period 10/31/23 to 01/01/24 for visits 41-50  See note below for Objective Data and Assessment of Progress/Goals.      Patient Name: Brandon Mendoza. MRN: [id number] DOB:11-22-1940, 83 y.o., male Today's Date: 01/08/2024  END OF SESSION:  PT End of Session - 01/08/24 0859     Visit Number 51    Date for Recertification  01/17/24    Authorization Type Medicare    PT Start Time 0844    PT Stop Time 0930    PT Time Calculation (min) 46 min    Activity Tolerance Patient tolerated treatment well    Behavior During Therapy [facility] for tasks assessed/performed          Past Medical History:  Diagnosis Date   Acquired clawfoot, right foot 12/16/2021   Anxiety    Atrophy of calf muscles 10/21/2021   Duplex calves was negative for significant blockage EMG was done at [facility] MRI lumbar spine was also done at [facility]   Carotid artery occlusion    CKD (chronic kidney disease)    Stage 3   COPD (chronic obstructive pulmonary disease) (HCC)    Depression    Dysrhythmia    PAF, atrial tachycardia   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   The spinal cord appears normal. 2.   No spinal stenosis. 3.   Mild multilevel degenerative changes as detailed above that do not lead to spinal stenosis or nerve root compression. 4.   T2 hyperintense foci within the pons consistent with chronic microvascular ischemic changes.   Loop - Medtronic Linq 10/22/2020 10/22/2020   Nocturia    Paroxysmal atrial fibrillation (HCC)    Peripheral neuropathy    Peripheral vascular disease    Sleep apnea    on 6 cm, nasal pillow   Spinal stenosis    Stroke (HCC) 05/01/2011   ischemic   Weakness generalized 10/21/2021    Severe, legs and arms   Past Surgical History:  Procedure Laterality Date   CAROTID ENDARTERECTOMY  12/09/11   Left cea   ENDARTERECTOMY  12/09/2011   Procedure: ENDARTERECTOMY CAROTID;  Surgeon: Krystal JULIANNA Doing, MD;  Location: [facility] OR;  Service: Vascular;  Laterality: Left;   EYE SURGERY     rt retina detachment   HAMMER TOE SURGERY  2004   HERNIA REPAIR  2006   I & D EXTREMITY Right 10/07/2022   Procedure: IRRIGATION AND DEBRIDEMENT EXTREMITY WITH WOUND CLOSURE;  Surgeon: Burnetta Aures, MD;  Location: [facility] ORS;  Service: Orthopedics;  Laterality: Right;   QUADRICEPS TENDON REPAIR Right 07/12/2022   Procedure: REPAIR QUADRICEP TENDON;  Surgeon: Ernie Cough, MD;  Location: [facility];  Service: Orthopedics;  Laterality: Right;   QUADRICEPS TENDON REPAIR Right 08/30/2022   Procedure: REPAIR QUADRICEP TENDON;  Surgeon: Ernie Cough, MD;  Location: [facility];  Service: Orthopedics;  Laterality: Right;   SHOULDER ARTHROSCOPY W/ ROTATOR CUFF REPAIR  2010   TONSILLECTOMY     Patient Active Problem List   Diagnosis Date Noted   Coronary artery calcification 05/09/2023   Rupture of quadriceps tendon 11/03/2022   Wound dehiscence 10/07/2022   Quadriceps tendon rupture, right, subsequent encounter 08/30/2022  Quadriceps tendon rupture, right, initial encounter 07/12/2022   Unable to care for self 07/08/2022   Dehydration 07/08/2022   Depression 07/08/2022   COPD (chronic obstructive pulmonary disease) (HCC) 07/08/2022   Sialoadenitis of submandibular gland 04/28/2022   Macular degeneration 03/01/2022   Myofascial pain 02/10/2022   Lumbar spondylosis 01/03/2022   Acquired clawfoot, left foot 12/16/2021   Acquired clawfoot, right foot 12/16/2021   Claw foot 12/14/2021   Gait abnormality 12/10/2021   Spinal stenosis at L4-L5 level 12/10/2021   Ataxia 11/22/2021   Lightheadedness 11/02/2021   Claudication of lower extremity 10/21/2021   Atrophy of calf muscles 10/21/2021   Weakness generalized  10/21/2021   Focal neurological deficit 10/21/2021   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   Pain in joint of right hip 09/14/2021   High arches 07/22/2021   Hammertoes of both feet 07/22/2021   Neuropathy 07/22/2021   Constipation 05/28/2021   Disequilibrium 05/28/2021   History of colonic polyps 05/28/2021   Renal cyst 05/28/2021   Gastroesophageal reflux disease without esophagitis 05/05/2021   Ingrowing left great toenail 02/15/2021   S/P lumbar fusion 12/24/2020   Spinal stenosis of lumbar region 12/04/2020   Acquired complex renal cyst 11/23/2020   Functional gait abnormality 11/04/2020   Idiopathic neuropathy 11/02/2020   Tinea pedis of both feet 10/28/2020   Loop - Medtronic Linq 10/22/2020 10/22/2020   Benign hypertension with 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) 10/05/2020   Fall with injury 10/05/2020   Hematoma 09/16/2020   Normocytic anemia 09/14/2020   Leukocytosis 09/14/2020   AF (paroxysmal atrial fibrillation) (HCC) 09/14/2020   Chronic anticoagulation 09/14/2020   Hyponatremia 09/14/2020   Pain in left foot 01/22/2020   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   Pain in joint of right knee 12/27/2019   Recurrent falls 11/17/2019   Paroxysmal atrial fibrillation (HCC) 10/22/2019   Pain in right foot 10/14/2019   Falls 10/07/2019   Ataxia involving legs 08/12/2019   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   Urge incontinence 04/13/2018   Urinary urgency 04/13/2018   Pain in left knee 03/01/2018   Other intervertebral disc degeneration, lumbar region 06/16/2017   Acquired bilateral hammer toes 10/05/2015   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   Generalized anxiety disorder 05/12/2015   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   History of left-sided carotid endarterectomy 04/10/2014   Essential hypertension 04/10/2014    Occlusion and stenosis of carotid artery without mention of cerebral infarction 12/15/2011   Habitual alcohol use 12/01/2011   Carotid artery stenosis, symptomatic 11/30/2011   Cerebral infarction (HCC) 11/29/2011   Mixed hyperlipidemia     PCP: Bernardino Cone  REFERRING PROVIDER: Bernardino Cone  REFERRING DIAG: weakness, multiple falls  Rationale for Evaluation and Treatment: Rehabilitation  THERAPY DIAG:  Other abnormalities of gait and mobility  Repeated falls  Contracture of muscle, multiple sites  Muscle weakness (generalized)  Acute pain of right knee  Other low back pain  ONSET DATE: 10/07/22  SUBJECTIVE:  SUBJECTIVE STATEMENT: No falls    Patient asking about plan, he reports that he is worried that he will not be able to stay independent and live at home if he stops, he has no equipment and with the right knee's inability to extend and the left knee having issues with buckling due to neuropathy, he is worried about more falls and the strain on his wife   PERTINENT HISTORY:  Cerebral infarct 10/21/21, lumbar fusion, recurrent falls, has significant neuropathy and this is the reason for most of his falls   PAIN:  Are you having pain? Yes: NPRS scale: 0/10 Pain location: right knee Pain description: ache  Aggravating factors: being on my feet  Relieving factors: movement  PRECAUTIONS: Fall  WEIGHT BEARING RESTRICTIONS: No  FALLS:  Has patient fallen in last 6 months? Yes. Number of falls every few days   LIVING ENVIRONMENT: Lives with: lives with their spouse Lives in: House/apartment Stairs: Yes: Internal: 20 steps; on right going up Has following equipment at home: [name] - 4 wheeled, Wheelchair (manual), shower chair, Tour manager, Grab bars, Ramped entry, and trekking  pole  OCCUPATION: Retired  PLOF: Independent, prior to a year ago he was driving used trekking poles to walk  PATIENT GOALS: live at home  NEXT MD VISIT:   OBJECTIVE:   DIAGNOSTIC FINDINGS:    LUMBAR SPINE FINDINGS: 1. Wide laminectomy and posterior pedicle screw and rod fixation at L4-5 without residual or recurrent stenosis. 2. Progressive adjacent level disease at L3-4 with moderate right and mild left subarticular narrowing and moderate foraminal stenosis bilaterally. Progression is predominantly due to facet disease 3. Progressive moderate facet hypertrophy at L1-2 with a progressive rightward disc protrusion resulting in progressive moderate right foraminal stenosis. Asymmetric endplate marrow edema on the right at L1-2 is consistent with progressive disease as well. 4. Otherwise stable degenerative change without other focal stenosis.   THORACIC SPINE FINDINGS:  On sagittal views the vertebral bodies have normal height and alignment.  Hemangioma within T9 vertebral body and slightly within T10 vertebral body.  Anterior kyphosis.  Mild scoliosis convex right centered at T8 level. The spinal cord is normal in size and appearance. The paraspinal soft tissues are unremarkable.     On axial views there is no spinal stenosis or foraminal narrowing.  Limited views of the aorta, kidneys, liver, lungs and paraspinal muscles are notable for multiple large right renal cysts ranging in size from 1.5 to 7 cm.   COGNITION: Overall cognitive status: Within functional limits for tasks assessed     SENSATION: WFL  MUSCLE LENGTH: Hamstrings: mod tightness on both sides  POSTURE: rounded shoulders and forward head  LOWER EXTREMITY ROM:   the right knee has HS, has no quad tendon on the right, unable to lift the leg, left leg WFL's , left hip and right hip and ankles WFL's   LOWER EXTREMITY MMT: 4-/5 for the LE's except for the right knee extension UPPER EXTREMITY MMT:  4-/5 for  shoulders and elbows   FUNCTIONAL TESTS:  5XSTS: 83 seconds really has to use his hands and is unsteady with standing Cannot stand on his own without holding onto something due to poor balance   Transfers:  needs set up and MinA for safety, is impulsive and unsafe at times. GAIT: He is w/c dependent    TODAY'S TREATMENT:  DATE:  01/08/24 Standing in [facility] PT blocking knees for 3# marches 3# hip abduction And calf raises 20# biceps 25# tricpes 15# row 15# extension 10# chest press 45# HS curls 10# left leg extension PAssive knee stretches Feet on ball K2C, rotation, bridge, isometric abs Ball b/n knees squeeze Blue tband clamshells  01/01/24 In [facility] marching and hip abduction 2.5# with PT blocking knees Leg curls 45# 2x15 Leg extension 10# 2x15 15# straight arm pulls 15# rows 25# biceps 25# triceps 15# chest press Feet on ball K2C, rotation, bridge, isometric abs Black tband clamshells Black tband right hip supine extension All transfers require CGA  12/26/23 In [facility] 2.5# marches 2.5# hip abduction 2x15 15# rows 15# # straight arm pulls 10# chest press 25# triceps 25# biceps 45# leg curls 10# leg ext LEg press 50#, then left only no weight Feet on ball K2C, rotation, bridge and isometric abs  12/18/23 15# straight arm pulls 15# rows 15# chest press 25# triceps 25# biceps Leg curls 45# 10# leg extension In Pbars 2.5# marches with PT blocking knees  Passive stretch right knee ext Feet on ball K2C, rotation, bridge, isometric abs   12/13/23 15# rows 15# straight arm pulls 20# biceps 20# triceps 10# chest press In Pbars, worked on standing, then some marching 3 sets 15 each leg with PT blocking the stance leg Passive stretch right knee into extension Feet on ball K2C, rotation, bridge, isometric abs Black tband  clamshells Black tband hip extension supine Ball b/n knees squeeze with bottom raise  12/05/23 15# rows 15# straight arm pulls 45# leg curls the right only 20# LEft leg ext 10# 10# chest press 20# triceps  20# biceps Passive stretch of the right knee, passive HS and piriformis stretch bilaterally Feet on ball K2C, rotation, bridge, isometric abs Black tband clamshells  11/28/23 Standing in [facility] PT blocking knees weight shifts and marches 15# rows 15# straight arm pulls 20# triceps 20# biceps Leg press 50# Feet on ball K2C, rotation, bridges, isometric abs Black tband clamshess Ball squeeze with bridge   11/22/23 Pbars standing and marching with PT blocking knees Leg curls 45# 2x15, then right only 20# 2x15 Leg extension 10# left only 2 x 15 15# rows on pulleys 15# straight arm pulls 20# biceps 20# overhead triceps press 10# chest press Feet on ball K2C, rotation, bridge, isometric abs Passive right knee extension Black tband clamshells Ball b/n knees squeeze with bridge  11/14/23 Standing in the Pbars marching with PT blocking knees, toe raises 45# HS curls, then right only 20# 10# left only knee extension 15# rows 15# straight arm pulls Feet on ball K2C, rotation, bridges and isometric abs PAssive right knee extension, left piriformis stretch Black tband clamshells Ball b/n knees squeeze with bridge Asked him to give himself time after the nustep for his legs to recover before transfers   PATIENT EDUCATION:  Education details: POC Person educated: Patient Education method: Explanation Education comprehension: verbalized understanding  HOME EXERCISE PROGRAM:  ASSESSMENT:  CLINICAL IMPRESSION:   I continue to work with Mr Mikhail regarding his current level and his ability to stay home and live independently with his wife.  Doing more standing but required knees to be blocked by PT due to them buckling.  He gets fatigued and has difficulty at the end of the  sesion  WE continue to work on his overall health and hopefully his safety with transfers.    HE is doing the Nustep at home he  is still having some hip pain, he does report not taking my advice fully about taking days off.  He still is unsafe at time with the transfers especially when he is fatigued, he is still impulsive and goes too fast at times. The right leg has no quad tendon and the left leg has neuropathy so he is at a very high risk for falls and for regression as he could not exercise much on his own and his wife cannot help him with the standing I feel CGA and blocking the knees are needed due to at any time the knees can buckle, we have tried to teach him to do hip extension to keep the knee locked but he does not do this.  We have set up the maintenance program following medicare guidelines.  He qualifies for this due to the inability to be safe, has had multiple falls and is impulsive, tends to think he can do more than he can.  Patient has not had a fall in the past 5 weeks, he remains very unsafe at times and with any transfer needs min A or close CGA.  Tends to not understand that the right knee has no quad to hold the leg straight.  I  In the long run he is impulsive and unsafe and cannot exercise on his own to maintain his level, he is at a high risk for falls and there is no gym that has the ability to assist him. Session completed at mat table to eliminate trunk support for improved core stabilization. Core weakness with seated OHP.  Pt did report a fall yesterday stated he was standing pulling his pants up, I advised pt that he shouldn't put weight in his RLE, He ws adamant that he could put a little on it.     Justifying Skilled Maintenance for Rehab Services:   The goal of a skilled maintenance therapy program is to maintain the patient's current functional status or to prevent or slow deterioration. If a patient who is receiving restorative therapy then requires skilled maintenance  therapy based on the skills and judgment of the therapist, development of a maintenance program would occur during the last visit for restorative treatment.    Does the patient need treatment that necessitates skilled services that can only be provided by a skilled provider? Can a caregiver provide these services why or why not? 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  He is impulsive and at times very unsafe he could not do this on his own and his wife could not help him., history of COPD, CKD, depression, HTN, kyphosis, a-fib, PVD, spinal stenosis, and stroke as well as RC repair.  These limit the patient's independent carryover with PT  exercises and require max cueing for optimal performance of safety and to strengthen other mms for his future in therapy sessions.    The patient requires the skill of PT to help with ongoing exercise and mobility to prevent rapid and significant decline in functional mobility, strength, and to limit falls with his safety.  Therefore, continued skilled maintenance therapy is recommended for this patient.    OBJECTIVE IMPAIRMENTS: Abnormal gait, cardiopulmonary status limiting activity, decreased activity tolerance, decreased balance, decreased coordination, decreased endurance, decreased mobility, difficulty walking, decreased  ROM, decreased strength, improper body mechanics, postural dysfunction, and poor safety.   REHAB POTENTIAL: Fair dependent on carry over and patient compliance  CLINICAL DECISION MAKING: Evolving/moderate complexity  EVALUATION COMPLEXITY: Moderate  GOALS: Goals for skilled maintenance:     Long Term Goals: Target Date 12/13/23     The patient will preserve functional mobility within the home environment. Baseline: uses a w/c            Goal  Status: Progressing 11/14/23   2. The patient will maintain strength for transfers.            Baseline: able to perform with set up and at times requires mod A and at other time may need Max A due to poor safety and impulsiveness            Goal Status: progressing 11/14/23   3. The patient will minimize fall frequency.            Baseline: he has been having about a fall a month            Goal Status: ongoing 11/14/23   4. Safe with HEP at home and with equipment he has            Baseline: does HEP             Goal Status: progressing 11/14/23   PLAN:  PT FREQUENCY: 1x/week  PT DURATION: 12 weeks  PLANNED INTERVENTIONS: Therapeutic exercises, Therapeutic activity, Neuromuscular re-education, Balance training, Gait training, Patient/Family education, Self Care, Joint mobilization, Stair training, Cryotherapy, Moist heat, and Manual therapy.  PLAN FOR NEXT SESSION: Have sat up the maintenance program for Mr. Weare and progressing with this, biggest issue he is unsafe and cannot do on his own or with his wife.  Trying to maintain his level of independence of living at home with wife and safety  OBADIAH OZELL ORN, PT

## 2024-01-10 ENCOUNTER — Ambulatory Visit (INDEPENDENT_AMBULATORY_CARE_PROVIDER_SITE_OTHER)

## 2024-01-10 DIAGNOSIS — I480 Paroxysmal atrial fibrillation: Secondary | ICD-10-CM | POA: Diagnosis not present

## 2024-01-10 LAB — CUP PACEART REMOTE DEVICE CHECK
Date Time Interrogation Session: 20251014230742
Implantable Pulse Generator Implant Date: 20220728

## 2024-01-11 ENCOUNTER — Encounter

## 2024-01-15 ENCOUNTER — Ambulatory Visit: Admitting: Physical Therapy

## 2024-01-15 ENCOUNTER — Encounter: Payer: Self-pay | Admitting: Physical Therapy

## 2024-01-15 DIAGNOSIS — M6281 Muscle weakness (generalized): Secondary | ICD-10-CM

## 2024-01-15 DIAGNOSIS — R296 Repeated falls: Secondary | ICD-10-CM

## 2024-01-15 DIAGNOSIS — M25561 Pain in right knee: Secondary | ICD-10-CM

## 2024-01-15 DIAGNOSIS — R2689 Other abnormalities of gait and mobility: Secondary | ICD-10-CM | POA: Diagnosis not present

## 2024-01-15 DIAGNOSIS — M5459 Other low back pain: Secondary | ICD-10-CM

## 2024-01-15 DIAGNOSIS — M6249 Contracture of muscle, multiple sites: Secondary | ICD-10-CM

## 2024-01-15 NOTE — Therapy (Signed)
 OUTPATIENT PHYSICAL THERAPY TREATMENT    Patient Name: Brandon Mendoza. MRN: [id number] DOB:02-25-1941, 83 y.o., male Today's Date: 01/15/2024  END OF SESSION:  PT End of Session - 01/15/24 0851     Visit Number 52    Date for Recertification  01/17/24    Authorization Type Medicare    PT Start Time 0843    PT Stop Time 0928    PT Time Calculation (min) 45 min    Activity Tolerance Patient tolerated treatment well    Behavior During Therapy [facility] for tasks assessed/performed          Past Medical History:  Diagnosis Date   Acquired clawfoot, right foot 12/16/2021   Anxiety    Atrophy of calf muscles 10/21/2021   Duplex calves was negative for significant blockage EMG was done at [facility] MRI lumbar spine was also done at [facility]   Carotid artery occlusion    CKD (chronic kidney disease)    Stage 3   COPD (chronic obstructive pulmonary disease) (HCC)    Depression    Dysrhythmia    PAF, atrial tachycardia   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   The spinal cord appears normal. 2.   No spinal stenosis. 3.   Mild multilevel degenerative changes as detailed above that do not lead to spinal stenosis or nerve root compression. 4.   T2 hyperintense foci within the pons consistent with chronic microvascular ischemic changes.   Loop - Medtronic Linq 10/22/2020 10/22/2020   Nocturia    Paroxysmal atrial fibrillation (HCC)    Peripheral neuropathy    Peripheral vascular disease    Sleep apnea    on 6 cm, nasal pillow   Spinal stenosis    Stroke (HCC) 05/01/2011   ischemic   Weakness generalized 10/21/2021   Severe, legs and arms   Past Surgical History:  Procedure Laterality Date   CAROTID ENDARTERECTOMY  12/09/11   Left cea    ENDARTERECTOMY  12/09/2011   Procedure: ENDARTERECTOMY CAROTID;  Surgeon: Krystal JULIANNA Doing, MD;  Location: [facility];  Service: Vascular;  Laterality: Left;   EYE SURGERY     rt retina detachment   HAMMER TOE SURGERY  2004   HERNIA REPAIR  2006   I & D EXTREMITY Right 10/07/2022   Procedure: IRRIGATION AND DEBRIDEMENT EXTREMITY WITH WOUND CLOSURE;  Surgeon: Burnetta Aures, MD;  Location: [facility] ORS;  Service: Orthopedics;  Laterality: Right;   QUADRICEPS TENDON REPAIR Right 07/12/2022   Procedure: REPAIR QUADRICEP TENDON;  Surgeon: Ernie Cough, MD;  Location: [facility] OR[facility];  Service: Orthopedics;  Laterality: Right;   QUADRICEPS TENDON REPAIR Right 08/30/2022   Procedure: REPAIR QUADRICEP TENDON;  Surgeon: Ernie Cough, MD;  Location: [facility];  Service: Orthopedics;  Laterality: Right;   SHOULDER ARTHROSCOPY W/ ROTATOR CUFF REPAIR  2010   TONSILLECTOMY     Patient Active Problem List   Diagnosis Date Noted   Coronary artery calcification 05/09/2023   Rupture of quadriceps tendon 11/03/2022   Wound dehiscence 10/07/2022   Quadriceps tendon rupture, right, subsequent encounter 08/30/2022   Quadriceps tendon rupture, right, initial encounter 07/12/2022   Unable to care for self 07/08/2022   Dehydration 07/08/2022   Depression  07/08/2022   COPD (chronic obstructive pulmonary disease) (HCC) 07/08/2022   Sialoadenitis of submandibular gland 04/28/2022   Macular degeneration 03/01/2022   Myofascial pain 02/10/2022   Lumbar spondylosis 01/03/2022   Acquired clawfoot, left foot 12/16/2021   Acquired clawfoot, right foot 12/16/2021   Claw foot 12/14/2021   Gait abnormality 12/10/2021   Spinal stenosis at L4-L5 level 12/10/2021   Ataxia 11/22/2021   Lightheadedness 11/02/2021   Claudication of lower extremity 10/21/2021   Atrophy of calf muscles 10/21/2021   Weakness generalized 10/21/2021   Focal neurological deficit 10/21/2021   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   Pain  in joint of right hip 09/14/2021   High arches 07/22/2021   Hammertoes of both feet 07/22/2021   Neuropathy 07/22/2021   Constipation 05/28/2021   Disequilibrium 05/28/2021   History of colonic polyps 05/28/2021   Renal cyst 05/28/2021   Gastroesophageal reflux disease without esophagitis 05/05/2021   Ingrowing left great toenail 02/15/2021   S/P lumbar fusion 12/24/2020   Spinal stenosis of lumbar region 12/04/2020   Acquired complex renal cyst 11/23/2020   Functional gait abnormality 11/04/2020   Idiopathic neuropathy 11/02/2020   Tinea pedis of both feet 10/28/2020   Loop - Medtronic Linq 10/22/2020 10/22/2020   Benign hypertension with 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) 10/05/2020   Fall with injury 10/05/2020   Hematoma 09/16/2020   Normocytic anemia 09/14/2020   Leukocytosis 09/14/2020   AF (paroxysmal atrial fibrillation) (HCC) 09/14/2020   Chronic anticoagulation 09/14/2020   Hyponatremia 09/14/2020   Pain in left foot 01/22/2020   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   Pain in joint of right knee 12/27/2019   Recurrent falls 11/17/2019   Paroxysmal atrial fibrillation (HCC) 10/22/2019   Pain in right foot 10/14/2019   Falls 10/07/2019   Ataxia involving legs 08/12/2019   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   Urge incontinence 04/13/2018   Urinary urgency 04/13/2018   Pain in left knee 03/01/2018   Other intervertebral disc degeneration, lumbar region 06/16/2017   Acquired bilateral hammer toes 10/05/2015   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   Generalized anxiety disorder 05/12/2015   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   History of left-sided carotid endarterectomy 04/10/2014   Essential hypertension 04/10/2014   Occlusion and stenosis of carotid artery without mention of cerebral infarction 12/15/2011   Habitual alcohol use 12/01/2011   Carotid  artery stenosis, symptomatic 11/30/2011   Cerebral infarction (HCC) 11/29/2011   Mixed hyperlipidemia     PCP: Bernardino Cone  REFERRING PROVIDER: Bernardino Cone  REFERRING DIAG: weakness, multiple falls  Rationale for Evaluation and Treatment: Rehabilitation  THERAPY DIAG:  Other abnormalities of gait and mobility  Repeated falls  Contracture of muscle, multiple sites  Muscle weakness (generalized)  Acute pain of right knee  Other low back pain  ONSET DATE: 10/07/22  SUBJECTIVE:  SUBJECTIVE STATEMENT: Patient reports an increase left lateral hip pain reports that it has been getting worse over the past month, denies any recent falls   Patient asking about plan, he reports that he is worried that he will not be able to stay independent and live at home if he stops, he has no equipment and with the right knee's inability to extend and the left knee having issues with buckling due to neuropathy, he is worried about more falls and the strain on his wife   PERTINENT HISTORY:  Cerebral infarct 10/21/21, lumbar fusion, recurrent falls, has significant neuropathy and this is the reason for most of his falls   PAIN:  Are you having pain? Yes: NPRS scale: 0/10 Pain location: right knee Pain description: ache  Aggravating factors: being on my feet  Relieving factors: movement  PRECAUTIONS: Fall  WEIGHT BEARING RESTRICTIONS: No  FALLS:  Has patient fallen in last 6 months? Yes. Number of falls every few days   LIVING ENVIRONMENT: Lives with: lives with their spouse Lives in: House/apartment Stairs: Yes: Internal: 20 steps; on right going up Has following equipment at home: [name] - 4 wheeled, Wheelchair (manual), shower chair, Tour manager, Grab bars, Ramped entry, and trekking  pole  OCCUPATION: Retired  PLOF: Independent, prior to a year ago he was driving used trekking poles to walk  PATIENT GOALS: live at home  NEXT MD VISIT:   OBJECTIVE:   DIAGNOSTIC FINDINGS:    LUMBAR SPINE FINDINGS: 1. Wide laminectomy and posterior pedicle screw and rod fixation at L4-5 without residual or recurrent stenosis. 2. Progressive adjacent level disease at L3-4 with moderate right and mild left subarticular narrowing and moderate foraminal stenosis bilaterally. Progression is predominantly due to facet disease 3. Progressive moderate facet hypertrophy at L1-2 with a progressive rightward disc protrusion resulting in progressive moderate right foraminal stenosis. Asymmetric endplate marrow edema on the right at L1-2 is consistent with progressive disease as well. 4. Otherwise stable degenerative change without other focal stenosis.   THORACIC SPINE FINDINGS:  On sagittal views the vertebral bodies have normal height and alignment.  Hemangioma within T9 vertebral body and slightly within T10 vertebral body.  Anterior kyphosis.  Mild scoliosis convex right centered at T8 level. The spinal cord is normal in size and appearance. The paraspinal soft tissues are unremarkable.     On axial views there is no spinal stenosis or foraminal narrowing.  Limited views of the aorta, kidneys, liver, lungs and paraspinal muscles are notable for multiple large right renal cysts ranging in size from 1.5 to 7 cm.   COGNITION: Overall cognitive status: Within functional limits for tasks assessed     SENSATION: WFL  MUSCLE LENGTH: Hamstrings: mod tightness on both sides  POSTURE: rounded shoulders and forward head  LOWER EXTREMITY ROM:   the right knee has HS, has no quad tendon on the right, unable to lift the leg, left leg WFL's , left hip and right hip and ankles WFL's   LOWER EXTREMITY MMT: 4-/5 for the LE's except for the right knee extension UPPER EXTREMITY MMT:  4-/5 for  shoulders and elbows   FUNCTIONAL TESTS:  5XSTS: 83 seconds really has to use his hands and is unsteady with standing Cannot stand on his own without holding onto something due to poor balance   Transfers:  needs set up and MinA for safety, is impulsive and unsafe at times. GAIT: He is w/c dependent    TODAY'S TREATMENT:  DATE:  01/15/24 In [facility] 2.5# marches and abduction  had to stop due to left hip pain 15# rows 2x15 15# straight arm pulls 10# chest press 20# biceps 25# triceps STM to the left hip, buttock, ITB Passive stretch to the left HS and piriformis Feet on ball K2C, rotation, small bridge, isometric abs Passive stretch of the right leg for knee extension Ball b/n knees squeeze Blue tband clamshells  01/08/24 Standing in Pbars PT blocking knees for 3# marches 3# hip abduction And calf raises 20# biceps 25# tricpes 15# row 15# extension 10# chest press 45# HS curls 10# left leg extension PAssive knee stretches Feet on ball K2C, rotation, bridge, isometric abs Ball b/n knees squeeze Blue tband clamshells  01/01/24 In [facility] marching and hip abduction 2.5# with PT blocking knees Leg curls 45# 2x15 Leg extension 10# 2x15 15# straight arm pulls 15# rows 25# biceps 25# triceps 15# chest press Feet on ball K2C, rotation, bridge, isometric abs Black tband clamshells Black tband right hip supine extension All transfers require CGA  12/26/23 In [facility] 2.5# marches 2.5# hip abduction 2x15 15# rows 15# # straight arm pulls 10# chest press 25# triceps 25# biceps 45# leg curls 10# leg ext LEg press 50#, then left only no weight Feet on ball K2C, rotation, bridge and isometric abs  12/18/23 15# straight arm pulls 15# rows 15# chest press 25# triceps 25# biceps Leg curls 45# 10# leg extension In Pbars 2.5# marches with PT  blocking knees  Passive stretch right knee ext Feet on ball K2C, rotation, bridge, isometric abs   12/13/23 15# rows 15# straight arm pulls 20# biceps 20# triceps 10# chest press In Pbars, worked on standing, then some marching 3 sets 15 each leg with PT blocking the stance leg Passive stretch right knee into extension Feet on ball K2C, rotation, bridge, isometric abs Black tband clamshells Black tband hip extension supine Ball b/n knees squeeze with bottom raise  12/05/23 15# rows 15# straight arm pulls 45# leg curls the right only 20# LEft leg ext 10# 10# chest press 20# triceps  20# biceps Passive stretch of the right knee, passive HS and piriformis stretch bilaterally Feet on ball K2C, rotation, bridge, isometric abs Black tband clamshells  11/28/23 Standing in [facility] PT blocking knees weight shifts and marches 15# rows 15# straight arm pulls 20# triceps 20# biceps Leg press 50# Feet on ball K2C, rotation, bridges, isometric abs Black tband [employer] squeeze with bridge  PATIENT EDUCATION:  Education details: POC Person educated: Patient Education method: Explanation Education comprehension: verbalized understanding  HOME EXERCISE PROGRAM:  ASSESSMENT:  CLINICAL IMPRESSION:   I continue to work with Mr Grima regarding his current level and his ability to stay home and live independently with his wife.  His diagnosis is complicated in a way that requires continued skilled interventions to maintain his current level, he is unsafe with transfers and would not be able to do any significant exercise without skilled interventions as he has right knee that has a ruptured quad tendon causing him to be unable to bear weight on this side and has significant neuropathy in both legs , again this causes transfers to be unsafe and unreliable as heis knees will give at any time, he has had some falls at home and he feels that due to the PT interventions that he was able to  get up on his own, not hurting his wife or having to have an ambulance come to the house  and get him up.    WE continue to work on his overall health and hopefully his safety with transfers.    HE is doing the Nustep at home he is still having some hip pain, he does report not taking my advice fully about taking days off.  He still is unsafe at time with the transfers especially when he is fatigued, he is still impulsive and goes too fast at times. The right leg has no quad tendon and the left leg has neuropathy so he is at a very high risk for falls and for regression as he could not exercise much on his own and his wife cannot help him with the standing I feel CGA and blocking the knees are needed due to at any time the knees can buckle, we have tried to teach him to do hip extension to keep the knee locked but he does not do this.  We have set up the maintenance program following medicare guidelines.  He qualifies for this due to the inability to be safe, has had multiple falls and is impulsive, tends to think he can do more than he can.  Patient has not had a fall in the past 5 weeks, he remains very unsafe at times and with any transfer needs min A or close CGA.  Tends to not understand that the right knee has no quad to hold the leg straight.  I  In the long run he is impulsive and unsafe and cannot exercise on his own to maintain his level, he is at a high risk for falls and there is no gym that has the ability to assist him. Session completed at mat table to eliminate trunk support for improved core stabilization. Core weakness with seated OHP.  Pt did report a fall yesterday stated he was standing pulling his pants up, I advised pt that he shouldn't put weight in his RLE, He ws adamant that he could put a little on it.     Justifying Skilled Maintenance for Rehab Services:   The goal of a skilled maintenance therapy program is to maintain the patient's current functional status or to prevent or slow  deterioration. If a patient who is receiving restorative therapy then requires skilled maintenance therapy based on the skills and judgment of the therapist, development of a maintenance program would occur during the last visit for restorative treatment.    Does the patient need treatment that necessitates skilled services that can only be provided by a skilled provider? Can a caregiver provide these services why or why not? 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  He is impulsive and at times very unsafe he could not do this on his own and his wife could not help him., history of COPD, CKD, depression, HTN, kyphosis, a-fib, PVD, spinal stenosis, and stroke as well as RC repair.  These limit the patient's independent carryover with PT  exercises and require max cueing for optimal performance of safety and to strengthen other mms for his future in therapy sessions.    The patient requires the skill of PT to help with ongoing exercise and mobility to prevent rapid and significant decline in functional mobility, strength, and to limit falls with his safety.  Therefore, continued skilled  maintenance therapy is recommended for this patient.    OBJECTIVE IMPAIRMENTS: Abnormal gait, cardiopulmonary status limiting activity, decreased activity tolerance, decreased balance, decreased coordination, decreased endurance, decreased mobility, difficulty walking, decreased ROM, decreased strength, improper body mechanics, postural dysfunction, and poor safety.   REHAB POTENTIAL: Fair dependent on carry over and patient compliance  CLINICAL DECISION MAKING: Evolving/moderate complexity  EVALUATION COMPLEXITY: Moderate  GOALS: Goals for skilled maintenance:     Long Term Goals: Target Date 12/13/23     The patient  will preserve functional mobility within the home environment. Baseline: uses a w/c            Goal Status: met 01/15/24   2. The patient will maintain strength for transfers.            Baseline: able to perform with set up and at times requires mod A and at other time may need Max A due to poor safety and impulsiveness            Goal Status: progressing 11/14/23   3. The patient will minimize fall frequency.            Baseline: he has been having about a fall a month            Goal Status: progressing 01/15/24   4. Safe with HEP at home and with equipment he has            Baseline: does HEP             Goal Status: progressing 11/14/23   PLAN:  PT FREQUENCY: 1x/week  PT DURATION: 12 weeks  PLANNED INTERVENTIONS: Therapeutic exercises, Therapeutic activity, Neuromuscular re-education, Balance training, Gait training, Patient/Family education, Self Care, Joint mobilization, Stair training, Cryotherapy, Moist heat, and Manual therapy.  PLAN FOR NEXT SESSION: Have sat up the maintenance program for Mr. Deerman and progressing with this, biggest issue he is unsafe and cannot do on his own or with his wife.  Trying to maintain his level of independence of living at home with wife and safety  OBADIAH OZELL ORN, PT

## 2024-01-16 NOTE — Progress Notes (Signed)
 Remote Loop Recorder Transmission

## 2024-01-17 NOTE — Progress Notes (Signed)
 " Electrophysiology Office Note:   Date:  01/19/2024  ID:  Brandon VEAR [name], DOB 14-Nov-1940, MRN [id number]  Primary Cardiologist: Gordy Bergamo, MD Electrophysiologist: Fonda Kitty, MD      History of Present Illness:   Brandon Prather. is a 83 y.o. male with h/o hypertension, hyperlipidemia, remote stroke with left carotid endarterectomy (Dr. Oris), OSA on CPAP, remote tobacco use disorder, gait instability and frequent falls related to spinal stenosis and peripheral neuropathy, paroxysmal atrial fibrillation (not on anticoagulation due to spontaneous muscle hematoma and frequent falls) who is being seen today for EP follow up at the request of Orren Fabry, [place].   Discussed the use of AI scribe software for clinical note transcription with the patient, who gave verbal consent to proceed.  History of Present Illness Brandon Durr. is an 83 year old male with atrial fibrillation who presents for evaluation after discontinuing flecainide  due to side effects. He was referred by Dr. Bergamo for evaluation after stopping flecainide .  He has a history of atrial fibrillation and was previously managed with flecainide  for quite some time. Approximately two weeks ago, he discontinued the medication due to side effects, including diplopia and dizziness, particularly during physical activity. He had been taking 100 mg of flecainide , which he reduced to 50 mg before stopping it entirely.  His vision issues have been partially managed with prisms, but he continues to feel 'woozy' when moving around.  Since stopping his flecainide , he has not noticed a significant change in his vision.  He has a history of a cerebrovascular accident and underwent a carotid bypass procedure. He is not currently on a blood thinner due to previous bleeding issues but takes aspirin .  He mentions neuropathy, which has affected his balance and led to frequent falls. He recalls being very active until the age of 50, engaging in  activities such as climbing trees and playing racquetball, but his activity level has significantly decreased due to his neuropathy.   Review of systems complete and found to be negative unless listed in HPI.   EP Information / Studies Reviewed:    EKG is ordered today. Personal review as below.  EKG Interpretation Date/Time:  Thursday January 18 2024 11:07:14 EDT Ventricular Rate:  66 PR Interval:  180 QRS Duration:  76 QT Interval:  398 QTC Calculation: 417 R Axis:   80  Text Interpretation: Normal sinus rhythm Normal ECG When compared with ECG of 19-Oct-2023 08:42, Sinus rhythm has replaced Atrial fibrillation Confirmed by Kitty Fonda [phone]) on 01/19/2024 4:08:41 PM   Risk Assessment/Calculations:    CHA2DS2-VASc Score = 5   This indicates a 7.2% annual risk of stroke. The patient's score is based upon: CHF History: 0 HTN History: 1 Diabetes History: 0 Stroke History: 2 Vascular Disease History: 0 Age Score: 2 Gender Score: 0             Physical Exam:   VS:  BP 114/65   Pulse 66   Ht 5' 8 (1.727 m)   Wt 184 lb (83.5 kg)   SpO2 96%   BMI 27.98 kg/m    Wt Readings from Last 3 Encounters:  01/18/24 184 lb (83.5 kg)  12/21/23 184 lb (83.5 kg)  07/05/23 190 lb (86.2 kg)     GEN: Well nourished, well developed in no acute distress NECK: No JVD CARDIAC: Normal rate, regular rhythm RESPIRATORY:  Clear to auscultation without rales, wheezing or rhonchi  ABDOMEN: Soft, non-distended EXTREMITIES:  No edema;  No deformity   ASSESSMENT AND PLAN:    #S/p loop recorder: - Loop recorder was interrogated today.  He has had 2 episodes of paroxysmal atrial fibrillation since stopping his flecainide , longest 1 hour and 20 minutes.  #Paroxysmal atrial fibrillation: Recently stopped flecainide  d/t side effects.  This has not resulted in significant improvement in his vision, but has resulted in recurrence of atrial fibrillation. - Restart flecainide  50 mg twice  daily.  PR and QRS intervals are normal on ECG today.  He had been on 100 mg twice daily for quite some time without issue.  #Hypercoagulable state due to AF: Not on anticoagulation due to spontaneous muscle hematoma and frequent falls. - Will closely monitor burden with loop recorder after resuming flecainide .  If AF burden increases, then we will readdress starting oral anticoagulation versus Watchman device.  #Hypertension -At goal today.  Recommend checking blood pressures 1-2 times per week at home and recording the values.  Recommend bringing these recordings to the primary care physician.   Follow up with EP APP in 6 months  Signed, Fonda Kitty, MD  "

## 2024-01-18 ENCOUNTER — Encounter: Payer: Self-pay | Admitting: Cardiology

## 2024-01-18 ENCOUNTER — Ambulatory Visit: Attending: Cardiology | Admitting: Cardiology

## 2024-01-18 VITALS — BP 114/65 | HR 66 | Ht 68.0 in | Wt 184.0 lb

## 2024-01-18 DIAGNOSIS — Z95818 Presence of other cardiac implants and grafts: Secondary | ICD-10-CM | POA: Diagnosis present

## 2024-01-18 DIAGNOSIS — I480 Paroxysmal atrial fibrillation: Secondary | ICD-10-CM | POA: Diagnosis not present

## 2024-01-18 DIAGNOSIS — D6869 Other thrombophilia: Secondary | ICD-10-CM | POA: Diagnosis not present

## 2024-01-18 DIAGNOSIS — I10 Essential (primary) hypertension: Secondary | ICD-10-CM | POA: Diagnosis present

## 2024-01-18 MED ORDER — FLECAINIDE ACETATE 50 MG PO TABS: 50.0000 mg | ORAL_TABLET | Freq: Two times a day (BID) | ORAL | 3 refills | Status: DC

## 2024-01-18 NOTE — Patient Instructions (Addendum)
 Medication Instructions:  Your physician has recommended you make the following change in your medication:   1) START taking flecainide  50 mg twice daily  *If you need a refill on your cardiac medications before your next appointment, please call your pharmacy*  Follow-Up: At [facility], you and your health needs are our priority.  As part of our continuing mission to provide you with exceptional heart care, our providers are all part of one team.  This team includes your primary Cardiologist (physician) and Advanced Practice Providers or APPs (Physician Assistants and Nurse Practitioners) who all work together to provide you with the care you need, when you need it.  Your next appointment:   2 months  Provider:   You will see one of the following Advanced Practice Providers on your designated Care Team:   Charlies Arthur, PA-C Michael Andy Tillery, PA-C Suzann Riddle, NP Daphne Barrack, NP

## 2024-01-20 ENCOUNTER — Ambulatory Visit: Payer: Self-pay | Admitting: Cardiology

## 2024-01-22 ENCOUNTER — Encounter: Payer: Self-pay | Admitting: Physical Therapy

## 2024-01-22 ENCOUNTER — Ambulatory Visit: Admitting: Physical Therapy

## 2024-01-22 DIAGNOSIS — M5459 Other low back pain: Secondary | ICD-10-CM

## 2024-01-22 DIAGNOSIS — M25561 Pain in right knee: Secondary | ICD-10-CM

## 2024-01-22 DIAGNOSIS — R2689 Other abnormalities of gait and mobility: Secondary | ICD-10-CM | POA: Diagnosis not present

## 2024-01-22 DIAGNOSIS — R296 Repeated falls: Secondary | ICD-10-CM

## 2024-01-22 DIAGNOSIS — M6281 Muscle weakness (generalized): Secondary | ICD-10-CM

## 2024-01-22 DIAGNOSIS — M6249 Contracture of muscle, multiple sites: Secondary | ICD-10-CM

## 2024-01-22 NOTE — Therapy (Signed)
 OUTPATIENT PHYSICAL THERAPY TREATMENT    Patient Name: Brandon Mendoza. MRN: [id number] DOB:02-28-1941, 83 y.o., male Today's Date: 01/22/2024  END OF SESSION:  PT End of Session - 01/22/24 0856     Visit Number 53    Date for Recertification  02/22/24    Authorization Type Medicare    PT Start Time 0840    PT Stop Time 0925    PT Time Calculation (min) 45 min    Activity Tolerance Patient tolerated treatment well    Behavior During Therapy [facility] for tasks assessed/performed          Past Medical History:  Diagnosis Date   Acquired clawfoot, right foot 12/16/2021   Anxiety    Atrophy of calf muscles 10/21/2021   Duplex calves was negative for significant blockage EMG was done at [facility] MRI lumbar spine was also done at [facility]   Carotid artery occlusion    CKD (chronic kidney disease)    Stage 3   COPD (chronic obstructive pulmonary disease) (HCC)    Depression    Dysrhythmia    PAF, atrial tachycardia   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   The spinal cord appears normal. 2.   No spinal stenosis. 3.   Mild multilevel degenerative changes as detailed above that do not lead to spinal stenosis or nerve root compression. 4.   T2 hyperintense foci within the pons consistent with chronic microvascular ischemic changes.   Loop - Medtronic Linq 10/22/2020 10/22/2020   Nocturia    Paroxysmal atrial fibrillation (HCC)    Peripheral neuropathy    Peripheral vascular disease    Sleep apnea    on 6 cm, nasal pillow   Spinal stenosis    Stroke (HCC) 05/01/2011   ischemic   Weakness generalized 10/21/2021   Severe, legs and arms   Past Surgical History:  Procedure Laterality Date   CAROTID ENDARTERECTOMY  12/09/11   Left cea    ENDARTERECTOMY  12/09/2011   Procedure: ENDARTERECTOMY CAROTID;  Surgeon: Krystal JULIANNA Doing, MD;  Location: [facility] OR;  Service: Vascular;  Laterality: Left;   EYE SURGERY     rt retina detachment   HAMMER TOE SURGERY  2004   HERNIA REPAIR  2006   I & D EXTREMITY Right 10/07/2022   Procedure: IRRIGATION AND DEBRIDEMENT EXTREMITY WITH WOUND CLOSURE;  Surgeon: Burnetta Aures, MD;  Location: [facility] ORS;  Service: Orthopedics;  Laterality: Right;   QUADRICEPS TENDON REPAIR Right 07/12/2022   Procedure: REPAIR QUADRICEP TENDON;  Surgeon: Ernie Cough, MD;  Location: [facility] ORS;  Service: Orthopedics;  Laterality: Right;   QUADRICEPS TENDON REPAIR Right 08/30/2022   Procedure: REPAIR QUADRICEP TENDON;  Surgeon: Ernie Cough, MD;  Location: [facility] ORS;  Service: Orthopedics;  Laterality: Right;   SHOULDER ARTHROSCOPY W/ ROTATOR CUFF REPAIR  2010   TONSILLECTOMY     Patient Active Problem List   Diagnosis Date Noted   Coronary artery calcification 05/09/2023   Rupture of quadriceps tendon 11/03/2022   Wound dehiscence 10/07/2022   Quadriceps tendon rupture, right, subsequent encounter 08/30/2022   Quadriceps tendon rupture, right, initial encounter 07/12/2022   Unable to care for self 07/08/2022   Dehydration 07/08/2022   Depression  07/08/2022   COPD (chronic obstructive pulmonary disease) (HCC) 07/08/2022   Sialoadenitis of submandibular gland 04/28/2022   Macular degeneration 03/01/2022   Myofascial pain 02/10/2022   Lumbar spondylosis 01/03/2022   Acquired clawfoot, left foot 12/16/2021   Acquired clawfoot, right foot 12/16/2021   Claw foot 12/14/2021   Gait abnormality 12/10/2021   Spinal stenosis at L4-L5 level 12/10/2021   Ataxia 11/22/2021   Lightheadedness 11/02/2021   Claudication of lower extremity 10/21/2021   Atrophy of calf muscles 10/21/2021   Weakness generalized 10/21/2021   Focal neurological deficit 10/21/2021   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   Pain  in joint of right hip 09/14/2021   High arches 07/22/2021   Hammertoes of both feet 07/22/2021   Neuropathy 07/22/2021   Constipation 05/28/2021   Disequilibrium 05/28/2021   History of colonic polyps 05/28/2021   Renal cyst 05/28/2021   Gastroesophageal reflux disease without esophagitis 05/05/2021   Ingrowing left great toenail 02/15/2021   S/P lumbar fusion 12/24/2020   Spinal stenosis of lumbar region 12/04/2020   Acquired complex renal cyst 11/23/2020   Functional gait abnormality 11/04/2020   Idiopathic neuropathy 11/02/2020   Tinea pedis of both feet 10/28/2020   Loop - Medtronic Linq 10/22/2020 10/22/2020   Benign hypertension with 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) 10/05/2020   Fall with injury 10/05/2020   Hematoma 09/16/2020   Normocytic anemia 09/14/2020   Leukocytosis 09/14/2020   AF (paroxysmal atrial fibrillation) (HCC) 09/14/2020   Chronic anticoagulation 09/14/2020   Hyponatremia 09/14/2020   Pain in left foot 01/22/2020   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   Pain in joint of right knee 12/27/2019   Recurrent falls 11/17/2019   Paroxysmal atrial fibrillation (HCC) 10/22/2019   Pain in right foot 10/14/2019   Falls 10/07/2019   Ataxia involving legs 08/12/2019   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   Urge incontinence 04/13/2018   Urinary urgency 04/13/2018   Pain in left knee 03/01/2018   Other intervertebral disc degeneration, lumbar region 06/16/2017   Acquired bilateral hammer toes 10/05/2015   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   Generalized anxiety disorder 05/12/2015   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   History of left-sided carotid endarterectomy 04/10/2014   Essential hypertension 04/10/2014   Occlusion and stenosis of carotid artery without mention of cerebral infarction 12/15/2011   Habitual alcohol use 12/01/2011   Carotid  artery stenosis, symptomatic 11/30/2011   Cerebral infarction (HCC) 11/29/2011   Mixed hyperlipidemia     PCP: Bernardino Cone  REFERRING PROVIDER: [facility] C[facility]  REFERRING DIAG: weakness, multiple falls  Rationale for Evaluation and Treatment: Rehabilitation  THERAPY DIAG:  Other abnormalities of gait and mobility  Repeated falls  Contracture of muscle, multiple sites  Muscle weakness (generalized)  Acute pain of right knee  Other low back pain  ONSET DATE: 10/07/22  SUBJECTIVE:  SUBJECTIVE STATEMENT: Patient reports that he has been having some increased difficulty with transfers, reports difficulty getting to standing.  Still a little pain in the left hip and back   Patient asking about plan, he reports that he is worried that he will not be able to stay independent and live at home if he stops, he has no equipment and with the right knee's inability to extend and the left knee having issues with buckling due to neuropathy, he is worried about more falls and the strain on his wife   PERTINENT HISTORY:  Cerebral infarct 10/21/21, lumbar fusion, recurrent falls, has significant neuropathy and this is the reason for most of his falls   PAIN:  Are you having pain? Yes: NPRS scale: 0/10 Pain location: right knee Pain description: ache  Aggravating factors: being on my feet  Relieving factors: movement  PRECAUTIONS: Fall  WEIGHT BEARING RESTRICTIONS: No  FALLS:  Has patient fallen in last 6 months? Yes. Number of falls every few days   LIVING ENVIRONMENT: Lives with: lives with their spouse Lives in: House/apartment Stairs: Yes: Internal: 20 steps; on right going up Has following equipment at home: [name] - 4 wheeled, Wheelchair (manual), shower chair, Tour manager, Grab bars, Ramped  entry, and trekking pole  OCCUPATION: Retired  PLOF: Independent, prior to a year ago he was driving used trekking poles to walk  PATIENT GOALS: live at home  NEXT MD VISIT:   OBJECTIVE:   DIAGNOSTIC FINDINGS:    LUMBAR SPINE FINDINGS: 1. Wide laminectomy and posterior pedicle screw and rod fixation at L4-5 without residual or recurrent stenosis. 2. Progressive adjacent level disease at L3-4 with moderate right and mild left subarticular narrowing and moderate foraminal stenosis bilaterally. Progression is predominantly due to facet disease 3. Progressive moderate facet hypertrophy at L1-2 with a progressive rightward disc protrusion resulting in progressive moderate right foraminal stenosis. Asymmetric endplate marrow edema on the right at L1-2 is consistent with progressive disease as well. 4. Otherwise stable degenerative change without other focal stenosis.   THORACIC SPINE FINDINGS:  On sagittal views the vertebral bodies have normal height and alignment.  Hemangioma within T9 vertebral body and slightly within T10 vertebral body.  Anterior kyphosis.  Mild scoliosis convex right centered at T8 level. The spinal cord is normal in size and appearance. The paraspinal soft tissues are unremarkable.     On axial views there is no spinal stenosis or foraminal narrowing.  Limited views of the aorta, kidneys, liver, lungs and paraspinal muscles are notable for multiple large right renal cysts ranging in size from 1.5 to 7 cm.   COGNITION: Overall cognitive status: Within functional limits for tasks assessed     SENSATION: WFL  MUSCLE LENGTH: Hamstrings: mod tightness on both sides  POSTURE: rounded shoulders and forward head  LOWER EXTREMITY ROM:   the right knee has HS, has no quad tendon on the right, unable to lift the leg, left leg WFL's , left hip and right hip and ankles WFL's   LOWER EXTREMITY MMT: 4-/5 for the LE's except for the right knee extension UPPER  EXTREMITY MMT:  4-/5 for shoulders and elbows   FUNCTIONAL TESTS:  5XSTS: 83 seconds really has to use his hands and is unsteady with standing Cannot stand on his own without holding onto something due to poor balance   Transfers:  needs set up and MinA for safety, is impulsive and unsafe at times. GAIT: He is w/c dependent    TODAY'S  TREATMENT:                                                                                                                              DATE:  01/22/24 In pbars 2.5# marches and hip abduction 2x15 each with PT blocking knees Sit to stand in pbars working on pushing with left leg Leg press 20#, 40# and 60# x 15 each Leg curls 45# 2x15 and then x10 Leg extension 10# 3x12 All transfers in the gym require CGA to block knees 15# chest press 15# rows 15# straight arm pulls 25# triceps 20# biceps Feet on ball K2C, rotation, bridge, isometric abs Blue tband clamshells   01/15/24 In Pbars 2.5# marches and abduction  had to stop due to left hip pain 15# rows 2x15 15# straight arm pulls 10# chest press 20# biceps 25# triceps STM to the left hip, buttock, ITB Passive stretch to the left HS and piriformis Feet on ball K2C, rotation, small bridge, isometric abs Passive stretch of the right leg for knee extension Ball b/n knees squeeze Blue tband clamshells  01/08/24 Standing in Pbars PT blocking knees for 3# marches 3# hip abduction And calf raises 20# biceps 25# tricpes 15# row 15# extension 10# chest press 45# HS curls 10# left leg extension PAssive knee stretches Feet on ball K2C, rotation, bridge, isometric abs Ball b/n knees squeeze Blue tband clamshells  01/01/24 In Pbars marching and hip abduction 2.5# with PT blocking knees Leg curls 45# 2x15 Leg extension 10# 2x15 15# straight arm pulls 15# rows 25# biceps 25# triceps 15# chest press Feet on ball K2C, rotation, bridge, isometric abs Black tband clamshells Black tband  right hip supine extension All transfers require CGA  12/26/23 In Pbars 2.5# marches 2.5# hip abduction 2x15 15# rows 15# # straight arm pulls 10# chest press 25# triceps 25# biceps 45# leg curls 10# leg ext LEg press 50#, then left only no weight Feet on ball K2C, rotation, bridge and isometric abs  12/18/23 15# straight arm pulls 15# rows 15# chest press 25# triceps 25# biceps Leg curls 45# 10# leg extension In Pbars 2.5# marches with PT blocking knees  Passive stretch right knee ext Feet on ball K2C, rotation, bridge, isometric abs   12/13/23 15# rows 15# straight arm pulls 20# biceps 20# triceps 10# chest press In Pbars, worked on standing, then some marching 3 sets 15 each leg with PT blocking the stance leg Passive stretch right knee into extension Feet on ball K2C, rotation, bridge, isometric abs Black tband clamshells Black tband hip extension supine Ball b/n knees squeeze with bottom raise  12/05/23 15# rows 15# straight arm pulls 45# leg curls the right only 20# LEft leg ext 10# 10# chest press 20# triceps  20# biceps Passive stretch of the right knee, passive HS and piriformis stretch bilaterally Feet on ball K2C, rotation, bridge, isometric abs Black tband clamshells  11/28/23 Standing in Pbars PT blocking knees  weight shifts and marches 15# rows 15# straight arm pulls 20# triceps 20# biceps Leg press 50# Feet on ball K2C, rotation, bridges, isometric abs Black tband clamshess Ball squeeze with bridge  PATIENT EDUCATION:  Education details: POC Person educated: Patient Education method: Explanation Education comprehension: verbalized understanding  HOME EXERCISE PROGRAM:  ASSESSMENT:  CLINICAL IMPRESSION: As noted below, we continue to work on him being functional at his home, over the past period he has not had any falls, he does report near falls and some struggles getting up from sitting.    I continue to work with Mr Wint  regarding his current level and his ability to stay home and live independently with his wife.  His diagnosis is complicated in a way that requires continued skilled interventions to maintain his current level, he is unsafe with transfers and would not be able to do any significant exercise without skilled interventions as he has right knee that has a ruptured quad tendon causing him to be unable to bear weight on this side and has significant neuropathy in both legs , again this causes transfers to be unsafe and unreliable as his knees will give at any time, he has had some falls at home and he feels that due to the PT interventions that he was able to get up on his own, not hurting his wife or having to have an ambulance come to the house and get him up.    WE continue to work on his overall health and hopefully his safety with transfers.    HE is doing the Nustep at home he is still having some hip pain, he does report not taking my advice fully about taking days off.  He still is unsafe at time with the transfers especially when he is fatigued, he is still impulsive and goes too fast at times. The right leg has no quad tendon and the left leg has neuropathy so he is at a very high risk for falls and for regression as he could not exercise much on his own and his wife cannot help him with the standing I feel CGA and blocking the knees are needed due to at any time the knees can buckle, we have tried to teach him to do hip extension to keep the knee locked but he does not do this.  We have set up the maintenance program following medicare guidelines.  He qualifies for this due to the inability to be safe, has had multiple falls and is impulsive, tends to think he can do more than he can.  Patient has not had a fall in the past 5 weeks, he remains very unsafe at times and with any transfer needs min A or close CGA.  Tends to not understand that the right knee has no quad to hold the leg straight.  I  In the  long run he is impulsive and unsafe and cannot exercise on his own to maintain his level, he is at a high risk for falls and there is no gym that has the ability to assist him. Session completed at mat table to eliminate trunk support for improved core stabilization. Core weakness with seated OHP.  Pt did report a fall yesterday stated he was standing pulling his pants up, I advised pt that he shouldn't put weight in his RLE, He ws adamant that he could put a little on it.     Justifying Skilled Maintenance for Rehab Services:   The goal of  a skilled maintenance therapy program is to maintain the patient's current functional status or to prevent or slow deterioration. If a patient who is receiving restorative therapy then requires skilled maintenance therapy based on the skills and judgment of the therapist, development of a maintenance program would occur during the last visit for restorative treatment.    Does the patient need treatment that necessitates skilled services that can only be provided by a skilled provider? Can a caregiver provide these services why or why not? 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  He is impulsive and at times very unsafe he could not do this on his own and his wife could not help him., history of COPD, CKD, depression, HTN, kyphosis, a-fib, PVD, spinal stenosis, and stroke as well as RC repair.  These limit the patient's independent carryover with PT  exercises and require max cueing for optimal performance of safety and to strengthen other mms for his future in therapy sessions.    The patient requires the skill of PT to help with ongoing exercise and mobility to prevent rapid and significant decline in functional mobility, strength, and to limit falls with his safety.   Therefore, continued skilled maintenance therapy is recommended for this patient.    OBJECTIVE IMPAIRMENTS: Abnormal gait, cardiopulmonary status limiting activity, decreased activity tolerance, decreased balance, decreased coordination, decreased endurance, decreased mobility, difficulty walking, decreased ROM, decreased strength, improper body mechanics, postural dysfunction, and poor safety.   REHAB POTENTIAL: Fair dependent on carry over and patient compliance  CLINICAL DECISION MAKING: Evolving/moderate complexity  EVALUATION COMPLEXITY: Moderate  GOALS: Goals for skilled maintenance:     Long Term Goals: Target Date 12/13/23     The patient will preserve functional mobility within the home environment. Baseline: uses a w/c            Goal Status: met 01/15/24   2. The patient will maintain strength for transfers.            Baseline: able to perform with set up and at times requires mod A and at other time may need Max A due to poor safety and impulsiveness            Goal Status: progressing 01/22/24/25   3. The patient will minimize fall frequency.            Baseline: he has been having about a fall a month            Goal Status: progressing 01/22/24   4. Safe with HEP at home and with equipment he has            Baseline: does HEP             Goal Status: progressing 01/22/24   PLAN:  PT FREQUENCY: 1x/week  PT DURATION: 12 weeks  PLANNED INTERVENTIONS: Therapeutic exercises, Therapeutic activity, Neuromuscular re-education, Balance training, Gait training, Patient/Family education, Self Care, Joint mobilization, Stair training, Cryotherapy, Moist heat, and Manual therapy.  PLAN FOR NEXT SESSION: Have sat up the maintenance program for Mr. Ibsen and progressing with this, biggest issue he is unsafe and cannot do on his own or with his wife.  Trying to maintain his level of independence of living at home with wife and safety  Jaimere Feutz  W, PT

## 2024-01-29 ENCOUNTER — Encounter: Payer: Self-pay | Admitting: Physical Therapy

## 2024-01-29 ENCOUNTER — Encounter: Payer: Self-pay | Admitting: Radiology

## 2024-01-29 ENCOUNTER — Encounter: Admitting: Family Medicine

## 2024-01-29 ENCOUNTER — Ambulatory Visit: Attending: Internal Medicine | Admitting: Physical Therapy

## 2024-01-29 ENCOUNTER — Encounter

## 2024-01-29 DIAGNOSIS — M5459 Other low back pain: Secondary | ICD-10-CM | POA: Insufficient documentation

## 2024-01-29 DIAGNOSIS — M25561 Pain in right knee: Secondary | ICD-10-CM | POA: Insufficient documentation

## 2024-01-29 DIAGNOSIS — R296 Repeated falls: Secondary | ICD-10-CM | POA: Diagnosis present

## 2024-01-29 DIAGNOSIS — M6249 Contracture of muscle, multiple sites: Secondary | ICD-10-CM | POA: Diagnosis present

## 2024-01-29 DIAGNOSIS — R2689 Other abnormalities of gait and mobility: Secondary | ICD-10-CM | POA: Diagnosis present

## 2024-01-29 DIAGNOSIS — M6281 Muscle weakness (generalized): Secondary | ICD-10-CM | POA: Insufficient documentation

## 2024-01-29 NOTE — Therapy (Signed)
 OUTPATIENT PHYSICAL THERAPY TREATMENT    Patient Name: Brandon Mendoza. MRN: [id number] DOB:08/19/1940, 83 y.o., male Today's Date: 01/29/2024  END OF SESSION:  PT End of Session - 01/29/24 0844     Visit Number [id number]    Date for Recertification  02/22/24    Authorization Type Medicare    PT Start Time 0844    PT Stop Time 0929    PT Time Calculation (min) 45 min    Activity Tolerance Patient tolerated treatment well    Behavior During Therapy [facility] for tasks assessed/performed          Past Medical History:  Diagnosis Date   Acquired clawfoot, right foot 12/16/2021   Anxiety    Atrophy of calf muscles 10/21/2021   Duplex calves was negative for significant blockage EMG was done at [facility] MRI lumbar spine was also done at [facility]   Carotid artery occlusion    CKD (chronic kidney disease)    Stage 3   COPD (chronic obstructive pulmonary disease) (HCC)    Depression    Dysrhythmia    PAF, atrial tachycardia   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   The spinal cord appears normal. 2.   No spinal stenosis. 3.   Mild multilevel degenerative changes as detailed above that do not lead to spinal stenosis or nerve root compression. 4.   T2 hyperintense foci within the pons consistent with chronic microvascular ischemic changes.   Loop - Medtronic Linq 10/22/2020 10/22/2020   Nocturia    Paroxysmal atrial fibrillation (HCC)    Peripheral neuropathy    Peripheral vascular disease    Sleep apnea    on 6 cm, nasal pillow   Spinal stenosis    Stroke (HCC) 05/01/2011   ischemic   Weakness generalized 10/21/2021   Severe, legs and arms   Past Surgical History:  Procedure Laterality Date   CAROTID ENDARTERECTOMY  12/09/11   Left cea   ENDARTERECTOMY   12/09/2011   Procedure: ENDARTERECTOMY CAROTID;  Surgeon: Krystal JULIANNA Doing, MD;  Location: [facility] OR;  Service: Vascular;  Laterality: Left;   EYE SURGERY     rt retina detachment   HAMMER TOE SURGERY  2004   HERNIA REPAIR  2006   I & D EXTREMITY Right 10/07/2022   Procedure: IRRIGATION AND DEBRIDEMENT EXTREMITY WITH WOUND CLOSURE;  Surgeon: Burnetta Aures, MD;  Location: [facility];  Service: Orthopedics;  Laterality: Right;   QUADRICEPS TENDON REPAIR Right 07/12/2022   Procedure: REPAIR QUADRICEP TENDON;  Surgeon: Ernie Cough, MD;  Location: [facility];  Service: Orthopedics;  Laterality: Right;   QUADRICEPS TENDON REPAIR Right 08/30/2022   Procedure: REPAIR QUADRICEP TENDON;  Surgeon: Ernie Cough, MD;  Location: [facility];  Service: Orthopedics;  Laterality: Right;   SHOULDER ARTHROSCOPY W/ ROTATOR CUFF REPAIR  2010   TONSILLECTOMY     Patient Active Problem List   Diagnosis Date Noted   Coronary artery calcification 05/09/2023   Rupture of quadriceps tendon 11/03/2022   Wound dehiscence 10/07/2022   Quadriceps tendon rupture, right, subsequent encounter 08/30/2022   Quadriceps tendon rupture, right, initial encounter 07/12/2022   Unable to care for self 07/08/2022   Dehydration 07/08/2022   Depression  07/08/2022   COPD (chronic obstructive pulmonary disease) (HCC) 07/08/2022   Sialoadenitis of submandibular gland 04/28/2022   Macular degeneration 03/01/2022   Myofascial pain 02/10/2022   Lumbar spondylosis 01/03/2022   Acquired clawfoot, left foot 12/16/2021   Acquired clawfoot, right foot 12/16/2021   Claw foot 12/14/2021   Gait abnormality 12/10/2021   Spinal stenosis at L4-L5 level 12/10/2021   Ataxia 11/22/2021   Lightheadedness 11/02/2021   Claudication of lower extremity 10/21/2021   Atrophy of calf muscles 10/21/2021   Weakness generalized 10/21/2021   Focal neurological deficit 10/21/2021   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   Pain in joint of  right hip 09/14/2021   High arches 07/22/2021   Hammertoes of both feet 07/22/2021   Neuropathy 07/22/2021   Constipation 05/28/2021   Disequilibrium 05/28/2021   History of colonic polyps 05/28/2021   Renal cyst 05/28/2021   Gastroesophageal reflux disease without esophagitis 05/05/2021   Ingrowing left great toenail 02/15/2021   S/P lumbar fusion 12/24/2020   Spinal stenosis of lumbar region 12/04/2020   Acquired complex renal cyst 11/23/2020   Functional gait abnormality 11/04/2020   Idiopathic neuropathy 11/02/2020   Tinea pedis of both feet 10/28/2020   Loop - Medtronic Linq 10/22/2020 10/22/2020   Benign hypertension with 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) 10/05/2020   Fall with injury 10/05/2020   Hematoma 09/16/2020   Normocytic anemia 09/14/2020   Leukocytosis 09/14/2020   AF (paroxysmal atrial fibrillation) (HCC) 09/14/2020   Chronic anticoagulation 09/14/2020   Hyponatremia 09/14/2020   Pain in left foot 01/22/2020   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   Pain in joint of right knee 12/27/2019   Recurrent falls 11/17/2019   Paroxysmal atrial fibrillation (HCC) 10/22/2019   Pain in right foot 10/14/2019   Falls 10/07/2019   Ataxia involving legs 08/12/2019   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   Urge incontinence 04/13/2018   Urinary urgency 04/13/2018   Pain in left knee 03/01/2018   Other intervertebral disc degeneration, lumbar region 06/16/2017   Acquired bilateral hammer toes 10/05/2015   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   Generalized anxiety disorder 05/12/2015   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   History of left-sided carotid endarterectomy 04/10/2014   Essential hypertension 04/10/2014   Occlusion and stenosis of carotid artery without mention of cerebral infarction 12/15/2011   Habitual alcohol use 12/01/2011   Carotid artery  stenosis, symptomatic 11/30/2011   Cerebral infarction (HCC) 11/29/2011   Mixed hyperlipidemia     PCP: Bernardino Cone  REFERRING PROVIDER: Bernardino Cone  REFERRING DIAG: weakness, multiple falls  Rationale for Evaluation and Treatment: Rehabilitation  THERAPY DIAG:  Other abnormalities of gait and mobility  Repeated falls  Contracture of muscle, multiple sites  Muscle weakness (generalized)  Acute pain of right knee  Other low back pain  ONSET DATE: 10/07/22  SUBJECTIVE:  SUBJECTIVE STATEMENT: Still reports fatigue and stiff with some difficulty transferring   Patient asking about plan, he reports that he is worried that he will not be able to stay independent and live at home if he stops, he has no equipment and with the right knee's inability to extend and the left knee having issues with buckling due to neuropathy, he is worried about more falls and the strain on his wife   PERTINENT HISTORY:  Cerebral infarct 10/21/21, lumbar fusion, recurrent falls, has significant neuropathy and this is the reason for most of his falls   PAIN:  Are you having pain? Yes: NPRS scale: 0/10 Pain location: right knee Pain description: ache  Aggravating factors: being on my feet  Relieving factors: movement  PRECAUTIONS: Fall  WEIGHT BEARING RESTRICTIONS: No  FALLS:  Has patient fallen in last 6 months? Yes. Number of falls every few days   LIVING ENVIRONMENT: Lives with: lives with their spouse Lives in: House/apartment Stairs: Yes: Internal: 20 steps; on right going up Has following equipment at home: [name] - 4 wheeled, Wheelchair (manual), shower chair, Tour manager, Grab bars, Ramped entry, and trekking pole  OCCUPATION: Retired  PLOF: Independent, prior to a year ago he was driving used  trekking poles to walk  PATIENT GOALS: live at home  NEXT MD VISIT:   OBJECTIVE:   DIAGNOSTIC FINDINGS:    LUMBAR SPINE FINDINGS: 1. Wide laminectomy and posterior pedicle screw and rod fixation at L4-5 without residual or recurrent stenosis. 2. Progressive adjacent level disease at L3-4 with moderate right and mild left subarticular narrowing and moderate foraminal stenosis bilaterally. Progression is predominantly due to facet disease 3. Progressive moderate facet hypertrophy at L1-2 with a progressive rightward disc protrusion resulting in progressive moderate right foraminal stenosis. Asymmetric endplate marrow edema on the right at L1-2 is consistent with progressive disease as well. 4. Otherwise stable degenerative change without other focal stenosis.   THORACIC SPINE FINDINGS:  On sagittal views the vertebral bodies have normal height and alignment.  Hemangioma within T9 vertebral body and slightly within T10 vertebral body.  Anterior kyphosis.  Mild scoliosis convex right centered at T8 level. The spinal cord is normal in size and appearance. The paraspinal soft tissues are unremarkable.     On axial views there is no spinal stenosis or foraminal narrowing.  Limited views of the aorta, kidneys, liver, lungs and paraspinal muscles are notable for multiple large right renal cysts ranging in size from 1.5 to 7 cm.   COGNITION: Overall cognitive status: Within functional limits for tasks assessed     SENSATION: WFL  MUSCLE LENGTH: Hamstrings: mod tightness on both sides  POSTURE: rounded shoulders and forward head  LOWER EXTREMITY ROM:   the right knee has HS, has no quad tendon on the right, unable to lift the leg, left leg WFL's , left hip and right hip and ankles WFL's   LOWER EXTREMITY MMT: 4-/5 for the LE's except for the right knee extension UPPER EXTREMITY MMT:  4-/5 for shoulders and elbows   FUNCTIONAL TESTS:  5XSTS: 83 seconds really has to use his hands  and is unsteady with standing Cannot stand on his own without holding onto something due to poor balance   Transfers:  needs set up and MinA for safety, is impulsive and unsafe at times. GAIT: He is w/c dependent    TODAY'S TREATMENT:  DATE:  01/29/24 In pbars standing marching PT blocking knees And then hip abduction both with 2.5# Leg curls 45# 2x15 Leg extension left leg 10# 2x15 25# biceps curls 2x15 15# rows 15# extension 15# chest press with pulleys 25# triceps Feet on ball K2C, rotation, bridge isometric abs 2x15 Ball b/n knees squeeze Blue tband clamshells Passive stretch right knee for extension   01/22/24 In pbars 2.5# marches and hip abduction 2x15 each with PT blocking knees Sit to stand in pbars working on pushing with left leg Leg press 20#, 40# and 60# x 15 each Leg curls 45# 2x15 and then x10 Leg extension 10# 3x12 All transfers in the gym require CGA to block knees 15# chest press 15# rows 15# straight arm pulls 25# triceps 20# biceps Feet on ball K2C, rotation, bridge, isometric abs Blue tband clamshells   01/15/24 In Pbars 2.5# marches and abduction  had to stop due to left hip pain 15# rows 2x15 15# straight arm pulls 10# chest press 20# biceps 25# triceps STM to the left hip, buttock, ITB Passive stretch to the left HS and piriformis Feet on ball K2C, rotation, small bridge, isometric abs Passive stretch of the right leg for knee extension Ball b/n knees squeeze Blue tband clamshells  01/08/24 Standing in Pbars PT blocking knees for 3# marches 3# hip abduction And calf raises 20# biceps 25# tricpes 15# row 15# extension 10# chest press 45# HS curls 10# left leg extension PAssive knee stretches Feet on ball K2C, rotation, bridge, isometric abs Ball b/n knees squeeze Blue tband clamshells  01/01/24 In  Pbars marching and hip abduction 2.5# with PT blocking knees Leg curls 45# 2x15 Leg extension 10# 2x15 15# straight arm pulls 15# rows 25# biceps 25# triceps 15# chest press Feet on ball K2C, rotation, bridge, isometric abs Black tband clamshells Black tband right hip supine extension All transfers require CGA  12/26/23 In Pbars 2.5# marches 2.5# hip abduction 2x15 15# rows 15# # straight arm pulls 10# chest press 25# triceps 25# biceps 45# leg curls 10# leg ext LEg press 50#, then left only no weight Feet on ball K2C, rotation, bridge and isometric abs  12/18/23 15# straight arm pulls 15# rows 15# chest press 25# triceps 25# biceps Leg curls 45# 10# leg extension In Pbars 2.5# marches with PT blocking knees  Passive stretch right knee ext Feet on ball K2C, rotation, bridge, isometric abs   12/13/23 15# rows 15# straight arm pulls 20# biceps 20# triceps 10# chest press In Pbars, worked on standing, then some marching 3 sets 15 each leg with PT blocking the stance leg Passive stretch right knee into extension Feet on ball K2C, rotation, bridge, isometric abs Black tband clamshells Black tband hip extension supine Ball b/n knees squeeze with bottom raise  12/05/23 15# rows 15# straight arm pulls 45# leg curls the right only 20# LEft leg ext 10# 10# chest press 20# triceps  20# biceps Passive stretch of the right knee, passive HS and piriformis stretch bilaterally Feet on ball K2C, rotation, bridge, isometric abs Black tband clamshells  11/28/23 Standing in Pbars PT blocking knees weight shifts and marches 15# rows 15# straight arm pulls 20# triceps 20# biceps Leg press 50# Feet on ball K2C, rotation, bridges, isometric abs Black tband [employer] squeeze with bridge  PATIENT EDUCATION:  Education details: POC Person educated: Patient Education method: Explanation Education comprehension: verbalized understanding  HOME EXERCISE  PROGRAM:  ASSESSMENT:  CLINICAL IMPRESSION: As noted below,  we continue to work on him being functional at his home, over the past period he has not had any falls, he does report near falls and some struggles getting up from sitting.    I continue to work with Mr Moronta regarding his current level and his ability to stay home and live independently with his wife.  His diagnosis is complicated in a way that requires continued skilled interventions to maintain his current level, he is unsafe with transfers and would not be able to do any significant exercise without skilled interventions as he has right knee that has a ruptured quad tendon causing him to be unable to bear weight on this side and has significant neuropathy in both legs , again this causes transfers to be unsafe and unreliable as his knees will give at any time, he has had some falls at home and he feels that due to the PT interventions that he was able to get up on his own, not hurting his wife or having to have an ambulance come to the house and get him up.    WE continue to work on his overall health and hopefully his safety with transfers.    HE is doing the Nustep at home he is still having some hip pain, he does report not taking my advice fully about taking days off.  He still is unsafe at time with the transfers especially when he is fatigued, he is still impulsive and goes too fast at times. The right leg has no quad tendon and the left leg has neuropathy so he is at a very high risk for falls and for regression as he could not exercise much on his own and his wife cannot help him with the standing I feel CGA and blocking the knees are needed due to at any time the knees can buckle, we have tried to teach him to do hip extension to keep the knee locked but he does not do this.  We have set up the maintenance program following medicare guidelines.  He qualifies for this due to the inability to be safe, has had multiple falls and is  impulsive, tends to think he can do more than he can.  Patient has not had a fall in the past 5 weeks, he remains very unsafe at times and with any transfer needs min A or close CGA.  Tends to not understand that the right knee has no quad to hold the leg straight.  I  In the long run he is impulsive and unsafe and cannot exercise on his own to maintain his level, he is at a high risk for falls and there is no gym that has the ability to assist him. Session completed at mat table to eliminate trunk support for improved core stabilization. Core weakness with seated OHP.  Pt did report a fall yesterday stated he was standing pulling his pants up, I advised pt that he shouldn't put weight in his RLE, He ws adamant that he could put a little on it.     Justifying Skilled Maintenance for Rehab Services:   The goal of a skilled maintenance therapy program is to maintain the patient's current functional status or to prevent or slow deterioration. If a patient who is receiving restorative therapy then requires skilled maintenance therapy based on the skills and judgment of the therapist, development of a maintenance program would occur during the last visit for restorative treatment.    Does the patient need  treatment that necessitates skilled services that can only be provided by a skilled provider? Can a caregiver provide these services why or why not? 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  He is impulsive and at times very unsafe he could not do this on his own and his wife could not help him., history of COPD, CKD, depression, HTN, kyphosis, a-fib, PVD, spinal stenosis, and stroke as well as RC repair.  These limit the patient's independent carryover with PT  exercises and require max cueing for optimal performance  of safety and to strengthen other mms for his future in therapy sessions.    The patient requires the skill of PT to help with ongoing exercise and mobility to prevent rapid and significant decline in functional mobility, strength, and to limit falls with his safety.  Therefore, continued skilled maintenance therapy is recommended for this patient.    OBJECTIVE IMPAIRMENTS: Abnormal gait, cardiopulmonary status limiting activity, decreased activity tolerance, decreased balance, decreased coordination, decreased endurance, decreased mobility, difficulty walking, decreased ROM, decreased strength, improper body mechanics, postural dysfunction, and poor safety.   REHAB POTENTIAL: Fair dependent on carry over and patient compliance  CLINICAL DECISION MAKING: Evolving/moderate complexity  EVALUATION COMPLEXITY: Moderate  GOALS: Goals for skilled maintenance:     Long Term Goals: Target Date 12/13/23     The patient will preserve functional mobility within the home environment. Baseline: uses a w/c            Goal Status: met 01/15/24   2. The patient will maintain strength for transfers.            Baseline: able to perform with set up and at times requires mod A and at other time may need Max A due to poor safety and impulsiveness            Goal Status: progressing 01/22/24/25   3. The patient will minimize fall frequency.            Baseline: he has been having about a fall a month            Goal Status: progressing 01/22/24   4. Safe with HEP at home and with equipment he has            Baseline: does HEP             Goal Status: progressing 01/22/24   PLAN:  PT FREQUENCY: 1x/week  PT DURATION: 12 weeks  PLANNED INTERVENTIONS: Therapeutic exercises, Therapeutic activity, Neuromuscular re-education, Balance training, Gait training, Patient/Family education, Self Care, Joint mobilization, Stair training, Cryotherapy, Moist heat, and Manual therapy.  PLAN FOR NEXT SESSION:  Have sat up the maintenance program for Mr. Marchena and progressing with this, biggest issue he is unsafe and cannot do on his own or with his wife.  Trying to maintain his level of independence of living at home with wife and safety  OBADIAH OZELL ORN, PT

## 2024-02-05 ENCOUNTER — Ambulatory Visit: Admitting: Physical Therapy

## 2024-02-05 ENCOUNTER — Encounter: Payer: Self-pay | Admitting: Physical Therapy

## 2024-02-05 DIAGNOSIS — M6281 Muscle weakness (generalized): Secondary | ICD-10-CM

## 2024-02-05 DIAGNOSIS — R2689 Other abnormalities of gait and mobility: Secondary | ICD-10-CM

## 2024-02-05 DIAGNOSIS — M6249 Contracture of muscle, multiple sites: Secondary | ICD-10-CM

## 2024-02-05 DIAGNOSIS — R296 Repeated falls: Secondary | ICD-10-CM

## 2024-02-05 DIAGNOSIS — M5459 Other low back pain: Secondary | ICD-10-CM

## 2024-02-05 DIAGNOSIS — M25561 Pain in right knee: Secondary | ICD-10-CM

## 2024-02-05 NOTE — Therapy (Signed)
 OUTPATIENT PHYSICAL THERAPY TREATMENT    Patient Name: Brandon Mendoza. MRN: [id number] DOB:11-05-1940, 83 y.o., male Today's Date: 02/05/2024  END OF SESSION:  PT End of Session - 02/05/24 0857     Visit Number 55    Date for Recertification  02/22/24    Authorization Type Medicare    PT Start Time 0844    PT Stop Time 0929    PT Time Calculation (min) 45 min    Activity Tolerance Patient tolerated treatment well    Behavior During Therapy [facility] for tasks assessed/performed          Past Medical History:  Diagnosis Date   Acquired clawfoot, right foot 12/16/2021   Anxiety    Atrophy of calf muscles 10/21/2021   Duplex calves was negative for significant blockage EMG was done at [facility] MRI lumbar spine was also done at [facility]   Carotid artery occlusion    CKD (chronic kidney disease)    Stage 3   COPD (chronic obstructive pulmonary disease) (HCC)    Depression    Dysrhythmia    PAF, atrial tachycardia   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   The spinal cord appears normal. 2.   No spinal stenosis. 3.   Mild multilevel degenerative changes as detailed above that do not lead to spinal stenosis or nerve root compression. 4.   T2 hyperintense foci within the pons consistent with chronic microvascular ischemic changes.   Loop - Medtronic Linq 10/22/2020 10/22/2020   Nocturia    Paroxysmal atrial fibrillation (HCC)    Peripheral neuropathy    Peripheral vascular disease    Sleep apnea    on 6 cm, nasal pillow   Spinal stenosis    Stroke (HCC) 05/01/2011   ischemic   Weakness generalized 10/21/2021   Severe, legs and arms   Past Surgical History:  Procedure Laterality Date   CAROTID ENDARTERECTOMY  12/09/11   Left cea    ENDARTERECTOMY  12/09/2011   Procedure: ENDARTERECTOMY CAROTID;  Surgeon: Krystal JULIANNA Doing, MD;  Location: [facility] OR;  Service: Vascular;  Laterality: Left;   EYE SURGERY     rt retina detachment   HAMMER TOE SURGERY  2004   HERNIA REPAIR  2006   I & D EXTREMITY Right 10/07/2022   Procedure: IRRIGATION AND DEBRIDEMENT EXTREMITY WITH WOUND CLOSURE;  Surgeon: Burnetta Aures, MD;  Location: [facility] ORS;  Service: Orthopedics;  Laterality: Right;   QUADRICEPS TENDON REPAIR Right 07/12/2022   Procedure: REPAIR QUADRICEP TENDON;  Surgeon: Ernie Cough, MD;  Location: [facility] ORS;  Service: Orthopedics;  Laterality: Right;   QUADRICEPS TENDON REPAIR Right 08/30/2022   Procedure: REPAIR QUADRICEP TENDON;  Surgeon: Ernie Cough, MD;  Location: [facility] ORS;  Service: Orthopedics;  Laterality: Right;   SHOULDER ARTHROSCOPY W/ ROTATOR CUFF REPAIR  2010   TONSILLECTOMY     Patient Active Problem List   Diagnosis Date Noted   Coronary artery calcification 05/09/2023   Rupture of quadriceps tendon 11/03/2022   Wound dehiscence 10/07/2022   Quadriceps tendon rupture, right, subsequent encounter 08/30/2022   Quadriceps tendon rupture, right, initial encounter 07/12/2022   Unable to care for self 07/08/2022   Dehydration 07/08/2022   Depression  07/08/2022   COPD (chronic obstructive pulmonary disease) (HCC) 07/08/2022   Sialoadenitis of submandibular gland 04/28/2022   Macular degeneration 03/01/2022   Myofascial pain 02/10/2022   Lumbar spondylosis 01/03/2022   Acquired clawfoot, left foot 12/16/2021   Acquired clawfoot, right foot 12/16/2021   Claw foot 12/14/2021   Gait abnormality 12/10/2021   Spinal stenosis at L4-L5 level 12/10/2021   Ataxia 11/22/2021   Lightheadedness 11/02/2021   Claudication of lower extremity 10/21/2021   Atrophy of calf muscles 10/21/2021   Weakness generalized 10/21/2021   Focal neurological deficit 10/21/2021   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   Pain  in joint of right hip 09/14/2021   High arches 07/22/2021   Hammertoes of both feet 07/22/2021   Neuropathy 07/22/2021   Constipation 05/28/2021   Disequilibrium 05/28/2021   History of colonic polyps 05/28/2021   Renal cyst 05/28/2021   Gastroesophageal reflux disease without esophagitis 05/05/2021   Ingrowing left great toenail 02/15/2021   S/P lumbar fusion 12/24/2020   Spinal stenosis of lumbar region 12/04/2020   Acquired complex renal cyst 11/23/2020   Functional gait abnormality 11/04/2020   Idiopathic neuropathy 11/02/2020   Tinea pedis of both feet 10/28/2020   Loop - Medtronic Linq 10/22/2020 10/22/2020   Benign hypertension with 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) 10/05/2020   Fall with injury 10/05/2020   Hematoma 09/16/2020   Normocytic anemia 09/14/2020   Leukocytosis 09/14/2020   AF (paroxysmal atrial fibrillation) (HCC) 09/14/2020   Chronic anticoagulation 09/14/2020   Hyponatremia 09/14/2020   Pain in left foot 01/22/2020   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   Pain in joint of right knee 12/27/2019   Recurrent falls 11/17/2019   Paroxysmal atrial fibrillation (HCC) 10/22/2019   Pain in right foot 10/14/2019   Falls 10/07/2019   Ataxia involving legs 08/12/2019   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   Urge incontinence 04/13/2018   Urinary urgency 04/13/2018   Pain in left knee 03/01/2018   Other intervertebral disc degeneration, lumbar region 06/16/2017   Acquired bilateral hammer toes 10/05/2015   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   Generalized anxiety disorder 05/12/2015   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   History of left-sided carotid endarterectomy 04/10/2014   Essential hypertension 04/10/2014   Occlusion and stenosis of carotid artery without mention of cerebral infarction 12/15/2011   Habitual alcohol use 12/01/2011   Carotid  artery stenosis, symptomatic 11/30/2011   Cerebral infarction (HCC) 11/29/2011   Mixed hyperlipidemia     PCP: Bernardino Cone  REFERRING PROVIDER: Bernardino Cone  REFERRING DIAG: weakness, multiple falls  Rationale for Evaluation and Treatment: Rehabilitation  THERAPY DIAG:  Other abnormalities of gait and mobility  Repeated falls  Contracture of muscle, multiple sites  Other low back pain  Acute pain of right knee  Muscle weakness (generalized)  ONSET DATE: 10/07/22  SUBJECTIVE:  SUBJECTIVE STATEMENT: Patient reports a significant fall last week, he had done his nustep for 30 minutes and was getting in chair back into the house and then in a sitting chair and fell, he reports that he just had some bumps and scrapes, reports he was able to get up on his own with some time   Patient asking about plan, he reports that he is worried that he will not be able to stay independent and live at home if he stops, he has no equipment and with the right knee's inability to extend and the left knee having issues with buckling due to neuropathy, he is worried about more falls and the strain on his wife   PERTINENT HISTORY:  Cerebral infarct 10/21/21, lumbar fusion, recurrent falls, has significant neuropathy and this is the reason for most of his falls   PAIN:  Are you having pain? Yes: NPRS scale: 0/10 Pain location: right knee Pain description: ache  Aggravating factors: being on my feet  Relieving factors: movement  PRECAUTIONS: Fall  WEIGHT BEARING RESTRICTIONS: No  FALLS:  Has patient fallen in last 6 months? Yes. Number of falls every few days   LIVING ENVIRONMENT: Lives with: lives with their spouse Lives in: House/apartment Stairs: Yes: Internal: 20 steps; on right going up Has following  equipment at home: [name] - 4 wheeled, Wheelchair (manual), shower chair, Tour manager, Grab bars, Ramped entry, and trekking pole  OCCUPATION: Retired  PLOF: Independent, prior to a year ago he was driving used trekking poles to walk  PATIENT GOALS: live at home  NEXT MD VISIT:   OBJECTIVE:   DIAGNOSTIC FINDINGS:    LUMBAR SPINE FINDINGS: 1. Wide laminectomy and posterior pedicle screw and rod fixation at L4-5 without residual or recurrent stenosis. 2. Progressive adjacent level disease at L3-4 with moderate right and mild left subarticular narrowing and moderate foraminal stenosis bilaterally. Progression is predominantly due to facet disease 3. Progressive moderate facet hypertrophy at L1-2 with a progressive rightward disc protrusion resulting in progressive moderate right foraminal stenosis. Asymmetric endplate marrow edema on the right at L1-2 is consistent with progressive disease as well. 4. Otherwise stable degenerative change without other focal stenosis.   THORACIC SPINE FINDINGS:  On sagittal views the vertebral bodies have normal height and alignment.  Hemangioma within T9 vertebral body and slightly within T10 vertebral body.  Anterior kyphosis.  Mild scoliosis convex right centered at T8 level. The spinal cord is normal in size and appearance. The paraspinal soft tissues are unremarkable.     On axial views there is no spinal stenosis or foraminal narrowing.  Limited views of the aorta, kidneys, liver, lungs and paraspinal muscles are notable for multiple large right renal cysts ranging in size from 1.5 to 7 cm.   COGNITION: Overall cognitive status: Within functional limits for tasks assessed     SENSATION: WFL  MUSCLE LENGTH: Hamstrings: mod tightness on both sides  POSTURE: rounded shoulders and forward head  LOWER EXTREMITY ROM:   the right knee has HS, has no quad tendon on the right, unable to lift the leg, left leg WFL's , left hip and right hip and  ankles WFL's   LOWER EXTREMITY MMT: 4-/5 for the LE's except for the right knee extension UPPER EXTREMITY MMT:  4-/5 for shoulders and elbows   FUNCTIONAL TESTS:  5XSTS: 83 seconds really has to use his hands and is unsteady with standing Cannot stand on his own without holding onto something due to poor  balance   Transfers:  needs set up and MinA for safety, is impulsive and unsafe at times. GAIT: He is w/c dependent    TODAY'S TREATMENT:                                                                                                                              DATE:  02/05/24 In pbars 2.5# marches and hip abduction with PT blocking knees 15# rows 15# extension 15# chest press 25# biceps. 25# triceps 45# HS curls 10# leg extension Passive right knee stretch for extension Feet on ball K2C, rotation, bridges, isometric abs Ball b/n knees squeeze Blue tband clamshells Passive stretch left HS and piriformis  01/29/24 In pbars standing marching PT blocking knees And then hip abduction both with 2.5# Leg curls 45# 2x15 Leg extension left leg 10# 2x15 25# biceps curls 2x15 15# rows 15# extension 15# chest press with pulleys 25# triceps Feet on ball K2C, rotation, bridge isometric abs 2x15 Ball b/n knees squeeze Blue tband clamshells Passive stretch right knee for extension   01/22/24 In pbars 2.5# marches and hip abduction 2x15 each with PT blocking knees Sit to stand in pbars working on pushing with left leg Leg press 20#, 40# and 60# x 15 each Leg curls 45# 2x15 and then x10 Leg extension 10# 3x12 All transfers in the gym require CGA to block knees 15# chest press 15# rows 15# straight arm pulls 25# triceps 20# biceps Feet on ball K2C, rotation, bridge, isometric abs Blue tband clamshells   01/15/24 In Pbars 2.5# marches and abduction  had to stop due to left hip pain 15# rows 2x15 15# straight arm pulls 10# chest press 20# biceps 25# triceps STM to  the left hip, buttock, ITB Passive stretch to the left HS and piriformis Feet on ball K2C, rotation, small bridge, isometric abs Passive stretch of the right leg for knee extension Ball b/n knees squeeze Blue tband clamshells  01/08/24 Standing in Pbars PT blocking knees for 3# marches 3# hip abduction And calf raises 20# biceps 25# tricpes 15# row 15# extension 10# chest press 45# HS curls 10# left leg extension PAssive knee stretches Feet on ball K2C, rotation, bridge, isometric abs Ball b/n knees squeeze Blue tband clamshells  01/01/24 In Pbars marching and hip abduction 2.5# with PT blocking knees Leg curls 45# 2x15 Leg extension 10# 2x15 15# straight arm pulls 15# rows 25# biceps 25# triceps 15# chest press Feet on ball K2C, rotation, bridge, isometric abs Black tband clamshells Black tband right hip supine extension All transfers require CGA  12/26/23 In Pbars 2.5# marches 2.5# hip abduction 2x15 15# rows 15# # straight arm pulls 10# chest press 25# triceps 25# biceps 45# leg curls 10# leg ext LEg press 50#, then left only no weight Feet on ball K2C, rotation, bridge and isometric abs  12/18/23 15# straight arm pulls 15# rows 15# chest press 25# triceps 25# biceps Leg curls  45# 10# leg extension In Pbars 2.5# marches with PT blocking knees  Passive stretch right knee ext Feet on ball K2C, rotation, bridge, isometric abs   12/13/23 15# rows 15# straight arm pulls 20# biceps 20# triceps 10# chest press In Pbars, worked on standing, then some marching 3 sets 15 each leg with PT blocking the stance leg Passive stretch right knee into extension Feet on ball K2C, rotation, bridge, isometric abs Black tband clamshells Black tband hip extension supine Ball b/n knees squeeze with bottom raise  12/05/23 15# rows 15# straight arm pulls 45# leg curls the right only 20# LEft leg ext 10# 10# chest press 20# triceps  20# biceps Passive stretch of  the right knee, passive HS and piriformis stretch bilaterally Feet on ball K2C, rotation, bridge, isometric abs Black tband clamshells  11/28/23 Standing in Pbars PT blocking knees weight shifts and marches 15# rows 15# straight arm pulls 20# triceps 20# biceps Leg press 50# Feet on ball K2C, rotation, bridges, isometric abs Black tband [employer] squeeze with bridge  PATIENT EDUCATION:  Education details: POC Person educated: Patient Education method: Explanation Education comprehension: verbalized understanding  HOME EXERCISE PROGRAM:  ASSESSMENT:  CLINICAL IMPRESSION: Patient had a fall last week, again he was able to get up on his own without wife and without calling EMS, he reports taht it was his fault just going fast and not preparing for the safety of the transfer which we have talked about in the past   I continue to work with Mr Sigmon regarding his current level and his ability to stay home and live independently with his wife.  His diagnosis is complicated in a way that requires continued skilled interventions to maintain his current level, he is unsafe with transfers and would not be able to do any significant exercise without skilled interventions as he has right knee that has a ruptured quad tendon causing him to be unable to bear weight on this side and has significant neuropathy in both legs , again this causes transfers to be unsafe and unreliable as his knees will give at any time, he has had some falls at home and he feels that due to the PT interventions that he was able to get up on his own, not hurting his wife or having to have an ambulance come to the house and get him up.    WE continue to work on his overall health and hopefully his safety with transfers.    HE is doing the Nustep at home he is still having some hip pain, he does report not taking my advice fully about taking days off.  He still is unsafe at time with the transfers especially when he is  fatigued, he is still impulsive and goes too fast at times. The right leg has no quad tendon and the left leg has neuropathy so he is at a very high risk for falls and for regression as he could not exercise much on his own and his wife cannot help him with the standing I feel CGA and blocking the knees are needed due to at any time the knees can buckle, we have tried to teach him to do hip extension to keep the knee locked but he does not do this.  We have set up the maintenance program following medicare guidelines.  He qualifies for this due to the inability to be safe, has had multiple falls and is impulsive, tends to think he can do more  than he can.  Patient has not had a fall in the past 5 weeks, he remains very unsafe at times and with any transfer needs min A or close CGA.  Tends to not understand that the right knee has no quad to hold the leg straight.  I  In the long run he is impulsive and unsafe and cannot exercise on his own to maintain his level, he is at a high risk for falls and there is no gym that has the ability to assist him. Session completed at mat table to eliminate trunk support for improved core stabilization. Core weakness with seated OHP.  Pt did report a fall yesterday stated he was standing pulling his pants up, I advised pt that he shouldn't put weight in his RLE, He ws adamant that he could put a little on it.     Justifying Skilled Maintenance for Rehab Services:   The goal of a skilled maintenance therapy program is to maintain the patient's current functional status or to prevent or slow deterioration. If a patient who is receiving restorative therapy then requires skilled maintenance therapy based on the skills and judgment of the therapist, development of a maintenance program would occur during the last visit for restorative treatment.    Does the patient need treatment that necessitates skilled services that can only be provided by a skilled provider? Can a caregiver  provide these services why or why not? 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  He is impulsive and at times very unsafe he could not do this on his own and his wife could not help him., history of COPD, CKD, depression, HTN, kyphosis, a-fib, PVD, spinal stenosis, and stroke as well as RC repair.  These limit the patient's independent carryover with PT  exercises and require max cueing for optimal performance of safety and to strengthen other mms for his future in therapy sessions.    The patient requires the skill of PT to help with ongoing exercise and mobility to prevent rapid and significant decline in functional mobility, strength, and to limit falls with his safety.  Therefore, continued skilled maintenance therapy is recommended for this patient.    OBJECTIVE IMPAIRMENTS: Abnormal gait, cardiopulmonary status limiting activity, decreased activity tolerance, decreased balance, decreased coordination, decreased endurance, decreased mobility, difficulty walking, decreased ROM, decreased strength, improper body mechanics, postural dysfunction, and poor safety.   REHAB POTENTIAL: Fair dependent on carry over and patient compliance  CLINICAL DECISION MAKING: Evolving/moderate complexity  EVALUATION COMPLEXITY: Moderate  GOALS: Goals for skilled maintenance:     Long Term Goals: Target Date 12/13/23     The patient will preserve functional mobility within the home environment. Baseline: uses a w/c            Goal Status: met 01/15/24   2. The patient will maintain strength for transfers.            Baseline: able to perform with set up and at times requires mod A and at other time may need Max A due to poor safety and impulsiveness            Goal Status: progressing 01/22/24/25   3. The patient  will minimize fall frequency.  Baseline: he has been having about a fall a month            Goal Status: progressing 01/22/24   4. Safe with HEP at home and with equipment he has            Baseline: does HEP             Goal Status: progressing 02/05/24   PLAN:  PT FREQUENCY: 1x/week  PT DURATION: 12 weeks  PLANNED INTERVENTIONS: Therapeutic exercises, Therapeutic activity, Neuromuscular re-education, Balance training, Gait training, Patient/Family education, Self Care, Joint mobilization, Stair training, Cryotherapy, Moist heat, and Manual therapy.  PLAN FOR NEXT SESSION: Have sat up the maintenance program for Mr. Betancur and progressing with this, biggest issue he is unsafe and cannot do on his own or with his wife.  Trying to maintain his level of independence of living at home with wife and safety  OBADIAH OZELL ORN, PT

## 2024-02-06 ENCOUNTER — Encounter: Payer: Self-pay | Admitting: Family Medicine

## 2024-02-06 ENCOUNTER — Ambulatory Visit: Admitting: Family Medicine

## 2024-02-06 VITALS — BP 114/66 | HR 63 | Temp 97.3°F | Ht 68.0 in | Wt 188.9 lb

## 2024-02-06 DIAGNOSIS — I480 Paroxysmal atrial fibrillation: Secondary | ICD-10-CM

## 2024-02-06 DIAGNOSIS — Z8673 Personal history of transient ischemic attack (TIA), and cerebral infarction without residual deficits: Secondary | ICD-10-CM | POA: Insufficient documentation

## 2024-02-06 DIAGNOSIS — J449 Chronic obstructive pulmonary disease, unspecified: Secondary | ICD-10-CM | POA: Diagnosis not present

## 2024-02-06 DIAGNOSIS — E782 Mixed hyperlipidemia: Secondary | ICD-10-CM | POA: Diagnosis not present

## 2024-02-06 DIAGNOSIS — I10 Essential (primary) hypertension: Secondary | ICD-10-CM | POA: Diagnosis not present

## 2024-02-06 NOTE — Assessment & Plan Note (Signed)
 CT evidence of mild centrilobular emphysema. Asymptomatic at present. We will follow this expectantly.

## 2024-02-06 NOTE — Progress Notes (Signed)
 [facility] [address] [place] [address] Dept: [phone] Dept Fax: [phone]  Transfer of Care Office Visit  Subjective:    Patient ID: Brandon Dupras., male    DOB: 1940/07/20, 83 y.o..   MRN: [id number]  Chief Complaint  Patient presents with   Establish Care    [facility]- establish care.  C/o having trouble walking.     History of Present Illness:  Patient is in today to establish care. Brandon Mendoza was born in [place], [place]. His family moved a bit, so he lived in [place] and [place] during his childhood/adolescence. He attended the [employer]  and majored in Business. 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 He worked from [phone] in [place] and then moved to [place]. He has been married to JoAnn for 47 years. He has two children from a prior marriage and JoAnn has two. They had a son that died of complications of alcoholism. The other three children are 57-26 years old. They have 10 grandchildren. He quit smoking in 2006. He only rarely drinks alcohol.  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 He underwent a carotid endarterectomy. He also has atrial fibrillation. He is managed on flecainide  50 mg bid for rhythm control and aspirin  81 mg daily for stroke prevention. He had been on a DOAC previously, but was felt to be at high risk for bleeding as he has had multiple falls.  Brandon Mendoza has a complicated history of low back pain, s/p lumbar fusion, a major quadriceps tendon tear with two prior surgeries and peripheral neuropathy leading to lower leg muscle wasting. He uses a wheel chair at times. he is engaged in a regular exercise routine to try and improve his strength. .  Brandon Mendoza has a history of hypertension. This is managed with losartan -hydrochlorothiazide  50 -12.5 mg daily.   Brandon Mendoza has a history of hyperlipidemia. This is managed with rosuvastatin  10  mg daily.   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 He is manage don sertraline  50 mg daily.  Past Medical History: Patient Active Problem List   Diagnosis Date Noted   Coronary artery calcification 05/09/2023   Rupture of quadriceps tendon 11/03/2022   Wound dehiscence 10/07/2022   Quadriceps tendon rupture, right, subsequent encounter 08/30/2022   Quadriceps tendon rupture, right, initial encounter 07/12/2022   Unable to care for self 07/08/2022   Dehydration 07/08/2022   Depression 07/08/2022   COPD (chronic obstructive pulmonary disease) (HCC) 07/08/2022   Sialoadenitis of submandibular gland 04/28/2022   Macular degeneration 03/01/2022   Myofascial pain 02/10/2022   Lumbar spondylosis 01/03/2022   Acquired clawfoot, left foot 12/16/2021   Acquired clawfoot, right foot 12/16/2021   Claw foot 12/14/2021   Gait abnormality 12/10/2021   Spinal stenosis at L4-L5 level 12/10/2021   Ataxia 11/22/2021   Lightheadedness 11/02/2021   Claudication of lower extremity 10/21/2021   Atrophy of calf muscles 10/21/2021   Weakness generalized 10/21/2021   Focal neurological deficit 10/21/2021   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   Pain in joint of right hip 09/14/2021   High arches 07/22/2021   Hammertoes of both feet 07/22/2021   Neuropathy 07/22/2021   Constipation 05/28/2021   Disequilibrium 05/28/2021   History of colonic polyps 05/28/2021   Renal cyst 05/28/2021   Gastroesophageal reflux disease without esophagitis 05/05/2021   Ingrowing left great toenail 02/15/2021   S/P lumbar  fusion 12/24/2020   Spinal stenosis of lumbar region 12/04/2020   Acquired complex renal cyst 11/23/2020   Functional gait abnormality 11/04/2020   Idiopathic neuropathy 11/02/2020   Tinea pedis of both feet 10/28/2020   Loop - [employer] Linq 10/22/2020 10/22/2020   Benign hypertension with 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) 10/05/2020   Fall  with injury 10/05/2020   Hematoma 09/16/2020   Normocytic anemia 09/14/2020   Leukocytosis 09/14/2020   AF (paroxysmal atrial fibrillation) (HCC) 09/14/2020   Chronic anticoagulation 09/14/2020   Hyponatremia 09/14/2020   Pain in left foot 01/22/2020   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   Pain in joint of right knee 12/27/2019   Recurrent falls 11/17/2019   Paroxysmal atrial fibrillation (HCC) 10/22/2019   Pain in right foot 10/14/2019   Falls 10/07/2019   Ataxia involving legs 08/12/2019   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   Urge incontinence 04/13/2018   Urinary urgency 04/13/2018   Pain in left knee 03/01/2018   Other intervertebral disc degeneration, lumbar region 06/16/2017   Acquired bilateral hammer toes 10/05/2015   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   Generalized anxiety disorder 05/12/2015   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   History of left-sided carotid endarterectomy 04/10/2014   Essential hypertension 04/10/2014   Occlusion and stenosis of carotid artery without mention of cerebral infarction 12/15/2011   Habitual alcohol use 12/01/2011   Carotid artery stenosis, symptomatic 11/30/2011   Cerebral infarction (HCC) 11/29/2011   Mixed hyperlipidemia    Past Surgical History:  Procedure Laterality Date   CAROTID ENDARTERECTOMY  12/09/11   Left cea   ENDARTERECTOMY  12/09/2011   Procedure: ENDARTERECTOMY CAROTID;  Surgeon: Krystal JULIANNA Doing, MD;  [facility];  Service: Vascular;  Laterality: Left;   EYE SURGERY     rt retina detachment   HAMMER TOE SURGERY  2004   HERNIA REPAIR  2006   I & D EXTREMITY Right 10/07/2022   Procedure: IRRIGATION AND DEBRIDEMENT EXTREMITY WITH WOUND CLOSURE;  Surgeon: Burnetta Aures, MD;  Location: [facility] ORS;  Service: Orthopedics;  Laterality: Right;   QUADRICEPS TENDON REPAIR Right 07/12/2022   Procedure: REPAIR QUADRICEP  TENDON;  Surgeon: Ernie Cough, MD;  Location: [facility] ORS;  Service: Orthopedics;  Laterality: Right;   QUADRICEPS TENDON REPAIR Right 08/30/2022   Procedure: REPAIR QUADRICEP TENDON;  Surgeon: Ernie Cough, MD;  Location: [facility] ORS;  Service: Orthopedics;  Laterality: Right;   SHOULDER ARTHROSCOPY W/ ROTATOR CUFF REPAIR  2010   TONSILLECTOMY     Family History  Problem Relation Age of Onset   Alzheimer's disease Father    Diabetes Other    Outpatient Medications Prior to Visit  Medication Sig Dispense Refill   aspirin  EC 81 MG tablet Take 81 mg by mouth daily. Swallow whole. Four times a week (Sunday, Monday, Wednesday and Friday).     flecainide  (TAMBOCOR ) 50 MG tablet Take 1 tablet (50 mg total) by mouth 2 (two) times daily. 180 tablet 3   losartan -hydrochlorothiazide  (HYZAAR) 50-12.5 MG tablet Take 1 tablet by mouth every morning. 90 tablet 4   melatonin 5 MG TABS 1 tablet in the evening Orally Once a day     rosuvastatin  (CRESTOR ) 10 MG tablet Take 1 tablet (10 mg total) by mouth every evening. 90 tablet 3   sertraline  (ZOLOFT ) 50 MG tablet TAKE AS INSTRUCTED BY YOUR PRESCRIBER 90 tablet 3   No facility-administered medications prior to  visit.   No Known Allergies    Objective:   Today's Vitals   02/06/24 1023  BP: 114/66  Pulse: 63  Temp: (!) 97.3 F (36.3 C)  TempSrc: Temporal  SpO2: 96%  Weight: 188 lb 14.4 oz (85.7 kg)  Height: 5' 8 (1.727 m)   Body mass index is 28.72 kg/m.   General: Well developed, well nourished. No acute distress. Psych: Alert and oriented. Normal mood and affect.  There are no preventive care reminders to display for this patient.  Assessment & Plan:   Problem List Items Addressed This Visit       Cardiovascular and Mediastinum   Essential hypertension - Primary   Blood pressure is in good control. Continue losartan -hydrochlorothiazide  50 -12.5 mg daily      Paroxysmal atrial fibrillation (HCC)   Rate is well controlled. Continue  flecainide  50 mg bid for rhythm control and aspirin  81 mg daily for stroke prevention.        Respiratory   COPD (chronic obstructive pulmonary disease) (HCC)   CT evidence of mild centrilobular emphysema. Asymptomatic at present. We will follow this expectantly.        Other   Mixed hyperlipidemia   LDL cholesterol is at goal. Continue rosuvastatin  20 mg daily.       Return in about 5 weeks (around 03/12/2024) for Annual preventative care.   Garnette CHRISTELLA Simpler, MD  I,Emily Lagle,acting as a scribe for Garnette CHRISTELLA Simpler, MD.,have documented all relevant documentation on the behalf of Garnette CHRISTELLA Simpler, MD.  I, Garnette CHRISTELLA Simpler, MD, have reviewed all documentation for this visit. The documentation on 02/06/2024 for the exam, diagnosis, procedures, and orders are all accurate and complete.

## 2024-02-06 NOTE — Assessment & Plan Note (Signed)
 Blood pressure is in good control. Continue losartan -hydrochlorothiazide  50 -12.5 mg daily

## 2024-02-06 NOTE — Assessment & Plan Note (Signed)
 LDL cholesterol is at goal. Continue rosuvastatin  20 mg daily.

## 2024-02-06 NOTE — Assessment & Plan Note (Signed)
 Rate is well controlled. Continue flecainide  50 mg bid for rhythm control and aspirin  81 mg daily for stroke prevention.

## 2024-02-10 ENCOUNTER — Encounter

## 2024-02-11 ENCOUNTER — Ambulatory Visit

## 2024-02-12 ENCOUNTER — Encounter

## 2024-02-13 ENCOUNTER — Ambulatory Visit: Admitting: Physical Therapy

## 2024-02-13 ENCOUNTER — Encounter: Payer: Self-pay | Admitting: Physical Therapy

## 2024-02-13 DIAGNOSIS — M25561 Pain in right knee: Secondary | ICD-10-CM

## 2024-02-13 DIAGNOSIS — M6281 Muscle weakness (generalized): Secondary | ICD-10-CM

## 2024-02-13 DIAGNOSIS — R296 Repeated falls: Secondary | ICD-10-CM

## 2024-02-13 DIAGNOSIS — R2689 Other abnormalities of gait and mobility: Secondary | ICD-10-CM

## 2024-02-13 DIAGNOSIS — M6249 Contracture of muscle, multiple sites: Secondary | ICD-10-CM

## 2024-02-13 NOTE — Therapy (Signed)
 OUTPATIENT PHYSICAL THERAPY TREATMENT    Patient Name: Brandon Mendoza. MRN: [id number] DOB:05/23/1940, 83 y.o., male Today's Date: 02/13/2024  END OF SESSION:  PT End of Session - 02/13/24 0844     Visit Number 56    Date for Recertification  02/22/24    PT Start Time 0845    PT Stop Time 0930    PT Time Calculation (min) 45 min    Activity Tolerance Patient tolerated treatment well    Behavior During Therapy [facility] for tasks assessed/performed          Past Medical History:  Diagnosis Date   Acquired clawfoot, right foot 12/16/2021   Anxiety    Atrophy of calf muscles 10/21/2021   Duplex calves was negative for significant blockage EMG was done at [facility] MRI lumbar spine was also done at [facility]   Carotid artery occlusion    CKD (chronic kidney disease)    Stage 3   COPD (chronic obstructive pulmonary disease) (HCC)    Depression    Dysrhythmia    PAF, atrial tachycardia   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   The spinal cord appears normal. 2.   No spinal stenosis. 3.   Mild multilevel degenerative changes as detailed above that do not lead to spinal stenosis or nerve root compression. 4.   T2 hyperintense foci within the pons consistent with chronic microvascular ischemic changes.   Loop - Medtronic Linq 10/22/2020 10/22/2020   Nocturia    Paroxysmal atrial fibrillation (HCC)    Peripheral neuropathy    Peripheral vascular disease    Sleep apnea    on 6 cm, nasal pillow   Spinal stenosis    Stroke (HCC) 05/01/2011   ischemic   Weakness generalized 10/21/2021   Severe, legs and arms   Past Surgical History:  Procedure Laterality Date   ENDARTERECTOMY  12/09/2011   Procedure: ENDARTERECTOMY CAROTID;  Surgeon: Krystal JULIANNA Doing, MD;   Location: [facility] OR;  Service: Vascular;  Laterality: Left;   EYE SURGERY     rt retina detachment   HAMMER TOE SURGERY  03/28/2002   HERNIA REPAIR  03/28/2004   I & D EXTREMITY Right 10/07/2022   Procedure: IRRIGATION AND DEBRIDEMENT EXTREMITY WITH WOUND CLOSURE;  Surgeon: Burnetta Aures, MD;  Location: [facility] ORS;  Service: Orthopedics;  Laterality: Right;   QUADRICEPS TENDON REPAIR Right 07/12/2022   Procedure: REPAIR QUADRICEP TENDON;  Surgeon: Ernie Cough, MD;  Location: [facility] ORS;  Service: Orthopedics;  Laterality: Right;   QUADRICEPS TENDON REPAIR Right 08/30/2022   Procedure: REPAIR QUADRICEP TENDON;  Surgeon: Ernie Cough, MD;  Location: [facility] ORS;  Service: Orthopedics;  Laterality: Right;   SHOULDER ARTHROSCOPY W/ ROTATOR CUFF REPAIR  03/28/2008   TONSILLECTOMY     Patient Active Problem List   Diagnosis Date Noted   History of stroke 02/06/2024   Coronary artery calcification 05/09/2023   COPD (chronic obstructive pulmonary disease) (HCC) 07/08/2022   Macular degeneration 03/01/2022   Lumbar spondylosis 01/03/2022   Acquired clawfoot, left foot 12/16/2021   Acquired clawfoot, right foot 12/16/2021   Spinal stenosis at L4-L5 level 12/10/2021   Ataxia 11/22/2021   Claudication of lower extremity 10/21/2021   Atrophy  of calf muscles 10/21/2021   Weakness generalized 10/21/2021   Kyphosis 10/21/2021   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   Disequilibrium 05/28/2021   S/P lumbar fusion 12/24/2020   Acquired complex renal cyst 11/23/2020   Functional gait abnormality 11/04/2020   Idiopathic neuropathy 11/02/2020   Loop - Medtronic Linq 10/22/2020 10/22/2020   Normocytic anemia 09/14/2020   Leukocytosis 09/14/2020   Hyponatremia 09/14/2020   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   Recurrent falls 11/17/2019   Paroxysmal atrial fibrillation (HCC) 10/22/2019   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   Urinary urgency 04/13/2018   Other intervertebral disc degeneration,  lumbar region 06/16/2017   Acquired bilateral hammer toes 10/05/2015   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   Generalized anxiety disorder 05/12/2015   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   Essential hypertension 04/10/2014   Occlusion and stenosis of carotid artery without mention of cerebral infarction 12/15/2011   Mixed hyperlipidemia     PCP: Bernardino Cone  REFERRING PROVIDER: Bernardino Cone  REFERRING DIAG: weakness, multiple falls  Rationale for Evaluation and Treatment: Rehabilitation  THERAPY DIAG:  Other abnormalities of gait and mobility  Repeated falls  Contracture of muscle, multiple sites  Acute pain of right knee  Muscle weakness (generalized)  ONSET DATE: 10/07/22  SUBJECTIVE:                                                                                                                                                                                           SUBJECTIVE STATEMENT: Been doing all right, no falls since last visit. Rode NuStep this morning 20 min L7   Patient asking about plan, he reports that he is worried that he will not be able to stay independent and live at home if he stops, he has no equipment and with the right knee's inability to extend and the left knee having issues with buckling due to neuropathy, he is worried about more falls and the strain on his wife   PERTINENT HISTORY:  Cerebral infarct 10/21/21, lumbar fusion, recurrent falls, has significant neuropathy and this is the reason for most of his falls   PAIN:  Are you having pain? Yes: NPRS scale: 0/10 Pain location: right knee Pain description: ache  Aggravating factors: being on my feet  Relieving factors: movement  PRECAUTIONS: Fall  WEIGHT BEARING RESTRICTIONS: No  FALLS:  Has patient fallen in last 6 months? Yes. Number of falls every few days   LIVING ENVIRONMENT: Lives with: lives with their spouse Lives in: House/apartment Stairs:  Yes: Internal: 20 steps; on right going up Has following equipment at home: [name] - 4 wheeled, Wheelchair (manual),  shower chair, Shower bench, Grab bars, Ramped entry, and trekking pole  OCCUPATION: Retired  PLOF: Independent, prior to a year ago he was driving used trekking poles to walk  PATIENT GOALS: live at home  NEXT MD VISIT:   OBJECTIVE:   DIAGNOSTIC FINDINGS:    LUMBAR SPINE FINDINGS: 1. Wide laminectomy and posterior pedicle screw and rod fixation at L4-5 without residual or recurrent stenosis. 2. Progressive adjacent level disease at L3-4 with moderate right and mild left subarticular narrowing and moderate foraminal stenosis bilaterally. Progression is predominantly due to facet disease 3. Progressive moderate facet hypertrophy at L1-2 with a progressive rightward disc protrusion resulting in progressive moderate right foraminal stenosis. Asymmetric endplate marrow edema on the right at L1-2 is consistent with progressive disease as well. 4. Otherwise stable degenerative change without other focal stenosis.   THORACIC SPINE FINDINGS:  On sagittal views the vertebral bodies have normal height and alignment.  Hemangioma within T9 vertebral body and slightly within T10 vertebral body.  Anterior kyphosis.  Mild scoliosis convex right centered at T8 level. The spinal cord is normal in size and appearance. The paraspinal soft tissues are unremarkable.     On axial views there is no spinal stenosis or foraminal narrowing.  Limited views of the aorta, kidneys, liver, lungs and paraspinal muscles are notable for multiple large right renal cysts ranging in size from 1.5 to 7 cm.   COGNITION: Overall cognitive status: Within functional limits for tasks assessed     SENSATION: WFL  MUSCLE LENGTH: Hamstrings: mod tightness on both sides  POSTURE: rounded shoulders and forward head  LOWER EXTREMITY ROM:   the right knee has HS, has no quad tendon on the right, unable to  lift the leg, left leg WFL's , left hip and right hip and ankles WFL's   LOWER EXTREMITY MMT: 4-/5 for the LE's except for the right knee extension UPPER EXTREMITY MMT:  4-/5 for shoulders and elbows   FUNCTIONAL TESTS:  5XSTS: 83 seconds really has to use his hands and is unsteady with standing Cannot stand on his own without holding onto something due to poor balance   Transfers:  needs set up and MinA for safety, is impulsive and unsafe at times. GAIT: He is w/c dependent    TODAY'S TREATMENT:                                                                                                                              DATE:  02/13/24 In pbars 2.5# marches and hip abduction with PT blocking knees 15# rows 15# extension 15# chest press 25# biceps. 25# triceps 45# HS curls, RLE 20lb 2x15 10# leg extension Seated trunk rotations blue ball Passive right knee stretch for extension Passive stretch left HS and piriformis  02/05/24 In pbars 2.5# marches and hip abduction with PT blocking knees 15# rows 15# extension 15# chest press 25# biceps. 25# triceps 45# HS curls 10# leg extension  Passive right knee stretch for extension Feet on ball K2C, rotation, bridges, isometric abs Ball b/n knees squeeze Blue tband clamshells Passive stretch left HS and piriformis  01/29/24 In pbars standing marching PT blocking knees And then hip abduction both with 2.5# Leg curls 45# 2x15 Leg extension left leg 10# 2x15 25# biceps curls 2x15 15# rows 15# extension 15# chest press with pulleys 25# triceps Feet on ball K2C, rotation, bridge isometric abs 2x15 Ball b/n knees squeeze Blue tband clamshells Passive stretch right knee for extension   01/22/24 In pbars 2.5# marches and hip abduction 2x15 each with PT blocking knees Sit to stand in pbars working on pushing with left leg Leg press 20#, 40# and 60# x 15 each Leg curls 45# 2x15 and then x10 Leg extension 10# 3x12 All  transfers in the gym require CGA to block knees 15# chest press 15# rows 15# straight arm pulls 25# triceps 20# biceps Feet on ball K2C, rotation, bridge, isometric abs Blue tband clamshells   01/15/24 In Pbars 2.5# marches and abduction  had to stop due to left hip pain 15# rows 2x15 15# straight arm pulls 10# chest press 20# biceps 25# triceps STM to the left hip, buttock, ITB Passive stretch to the left HS and piriformis Feet on ball K2C, rotation, small bridge, isometric abs Passive stretch of the right leg for knee extension Ball b/n knees squeeze Blue tband clamshells  01/08/24 Standing in Pbars PT blocking knees for 3# marches 3# hip abduction And calf raises 20# biceps 25# tricpes 15# row 15# extension 10# chest press 45# HS curls 10# left leg extension PAssive knee stretches Feet on ball K2C, rotation, bridge, isometric abs Ball b/n knees squeeze Blue tband clamshells  01/01/24 In Pbars marching and hip abduction 2.5# with PT blocking knees Leg curls 45# 2x15 Leg extension 10# 2x15 15# straight arm pulls 15# rows 25# biceps 25# triceps 15# chest press Feet on ball K2C, rotation, bridge, isometric abs Black tband clamshells Black tband right hip supine extension All transfers require CGA  12/26/23 In Pbars 2.5# marches 2.5# hip abduction 2x15 15# rows 15# # straight arm pulls 10# chest press 25# triceps 25# biceps 45# leg curls 10# leg ext LEg press 50#, then left only no weight Feet on ball K2C, rotation, bridge and isometric abs  12/18/23 15# straight arm pulls 15# rows 15# chest press 25# triceps 25# biceps Leg curls 45# 10# leg extension In Pbars 2.5# marches with PT blocking knees  Passive stretch right knee ext Feet on ball K2C, rotation, bridge, isometric abs   12/13/23 15# rows 15# straight arm pulls 20# biceps 20# triceps 10# chest press In Pbars, worked on standing, then some marching 3 sets 15 each leg with PT  blocking the stance leg Passive stretch right knee into extension Feet on ball K2C, rotation, bridge, isometric abs Black tband clamshells Black tband hip extension supine Ball b/n knees squeeze with bottom raise  12/05/23 15# rows 15# straight arm pulls 45# leg curls the right only 20# LEft leg ext 10# 10# chest press 20# triceps  20# biceps Passive stretch of the right knee, passive HS and piriformis stretch bilaterally Feet on ball K2C, rotation, bridge, isometric abs Black tband clamshells  11/28/23 Standing in Pbars PT blocking knees weight shifts and marches 15# rows 15# straight arm pulls 20# triceps 20# biceps Leg press 50# Feet on ball K2C, rotation, bridges, isometric abs Black tband [employer] squeeze with bridge  PATIENT EDUCATION:  Education details: POC Person educated: Patient Education method: Explanation Education comprehension: verbalized understanding  HOME EXERCISE PROGRAM:  ASSESSMENT:  CLINICAL IMPRESSION: Patient reports no falls since last session. Cues needed at times for eccentric controls with interventions. I continue to work with Mr Henriques regarding his current level and his ability to stay home and live independently with his wife.  His diagnosis is complicated in a way that requires continued skilled interventions to maintain his current level, he is unsafe with transfers and would not be able to do any significant exercise without skilled interventions as he has right knee that has a ruptured quad tendon causing him to be unable to bear weight on this side and has significant neuropathy in both legs , again this causes transfers to be unsafe and unreliable as his knees will give at any time, he has had some falls at home and he feels that due to the PT interventions that he was able to get up on his own, not hurting his wife or having to have an ambulance come to the house and get him up.    WE continue to work on his overall health and  hopefully his safety with transfers.    HE is doing the Nustep at home he is still having some hip pain, he does report not taking my advice fully about taking days off.  He still is unsafe at time with the transfers especially when he is fatigued, he is still impulsive and goes too fast at times. The right leg has no quad tendon and the left leg has neuropathy so he is at a very high risk for falls and for regression as he could not exercise much on his own and his wife cannot help him with the standing I feel CGA and blocking the knees are needed due to at any time the knees can buckle, we have tried to teach him to do hip extension to keep the knee locked but he does not do this.  We have set up the maintenance program following medicare guidelines.  He qualifies for this due to the inability to be safe, has had multiple falls and is impulsive, tends to think he can do more than he can.  Patient has not had a fall in the past 5 weeks, he remains very unsafe at times and with any transfer needs min A or close CGA.  Tends to not understand that the right knee has no quad to hold the leg straight.  I  In the long run he is impulsive and unsafe and cannot exercise on his own to maintain his level, he is at a high risk for falls and there is no gym that has the ability to assist him. Session completed at mat table to eliminate trunk support for improved core stabilization. Core weakness with seated OHP.  Pt did report a fall yesterday stated he was standing pulling his pants up, I advised pt that he shouldn't put weight in his RLE, He ws adamant that he could put a little on it.     Justifying Skilled Maintenance for Rehab Services:   The goal of a skilled maintenance therapy program is to maintain the patient's current functional status or to prevent or slow deterioration. If a patient who is receiving restorative therapy then requires skilled maintenance therapy based on the skills and judgment of the  therapist, development of a maintenance program would occur during the last visit for restorative treatment.    Does  the patient need treatment that necessitates skilled services that can only be provided by a skilled provider? Can a caregiver provide these services why or why not? 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  He is impulsive and at times very unsafe he could not do this on his own and his wife could not help him., history of COPD, CKD, depression, HTN, kyphosis, a-fib, PVD, spinal stenosis, and stroke as well as RC repair.  These limit the patient's independent carryover with PT  exercises and require max cueing for optimal performance of safety and to strengthen other mms for his future in therapy sessions.    The patient requires the skill of PT to help with ongoing exercise and mobility to prevent rapid and significant decline in functional mobility, strength, and to limit falls with his safety.  Therefore, continued skilled maintenance therapy is recommended for this patient.    OBJECTIVE IMPAIRMENTS: Abnormal gait, cardiopulmonary status limiting activity, decreased activity tolerance, decreased balance, decreased coordination, decreased endurance, decreased mobility, difficulty walking, decreased ROM, decreased strength, improper body mechanics, postural dysfunction, and poor safety.   REHAB POTENTIAL: Fair dependent on carry over and patient compliance  CLINICAL DECISION MAKING: Evolving/moderate complexity  EVALUATION COMPLEXITY: Moderate  GOALS: Goals for skilled maintenance:     Long Term Goals: Target Date 12/13/23     The patient will preserve functional mobility within the home environment. Baseline: uses a w/c            Goal Status: met 01/15/24   2. The patient will  maintain strength for transfers.            Baseline: able to perform with set up and at times requires mod A and at other time may need Max A due to poor safety and impulsiveness            Goal Status: progressing 01/22/24/25   3. The patient will minimize fall frequency.            Baseline: he has been having about a fall a month            Goal Status: progressing 01/22/24   4. Safe with HEP at home and with equipment he has            Baseline: does HEP             Goal Status: progressing 02/05/24   PLAN:  PT FREQUENCY: 1x/week  PT DURATION: 12 weeks  PLANNED INTERVENTIONS: Therapeutic exercises, Therapeutic activity, Neuromuscular re-education, Balance training, Gait training, Patient/Family education, Self Care, Joint mobilization, Stair training, Cryotherapy, Moist heat, and Manual therapy.  PLAN FOR NEXT SESSION: Have sat up the maintenance program for Mr. Kutzer and progressing with this, biggest issue he is unsafe and cannot do on his own or with his wife.  Trying to maintain his level of independence of living at home with wife and safety  Tanda KANDICE Sorrow, PTA

## 2024-02-14 ENCOUNTER — Ambulatory Visit: Attending: Cardiology

## 2024-02-14 DIAGNOSIS — I480 Paroxysmal atrial fibrillation: Secondary | ICD-10-CM | POA: Diagnosis not present

## 2024-02-14 LAB — CUP PACEART REMOTE DEVICE CHECK
Date Time Interrogation Session: 20251118231904
Implantable Pulse Generator Implant Date: 20220728

## 2024-02-16 ENCOUNTER — Encounter: Payer: Self-pay | Admitting: Cardiology

## 2024-02-16 NOTE — Progress Notes (Signed)
 Remote Loop Recorder Transmission

## 2024-02-20 ENCOUNTER — Ambulatory Visit: Admitting: Physical Therapy

## 2024-02-20 ENCOUNTER — Other Ambulatory Visit: Payer: Self-pay

## 2024-02-20 ENCOUNTER — Encounter: Payer: Self-pay | Admitting: Physical Therapy

## 2024-02-20 DIAGNOSIS — M6249 Contracture of muscle, multiple sites: Secondary | ICD-10-CM

## 2024-02-20 DIAGNOSIS — R2689 Other abnormalities of gait and mobility: Secondary | ICD-10-CM | POA: Diagnosis not present

## 2024-02-20 DIAGNOSIS — M25561 Pain in right knee: Secondary | ICD-10-CM

## 2024-02-20 DIAGNOSIS — R296 Repeated falls: Secondary | ICD-10-CM

## 2024-02-20 DIAGNOSIS — M6281 Muscle weakness (generalized): Secondary | ICD-10-CM

## 2024-02-20 NOTE — Therapy (Signed)
 OUTPATIENT PHYSICAL THERAPY TREATMENT    Patient Name: Brandon Mendoza. MRN: [id number] DOB:[birth year], 83 y.o., male Today's Date: 02/20/2024  END OF SESSION:  PT End of Session - 02/20/24 0844     Visit Number 57    Date for Recertification  02/22/24    PT Start Time 0845    PT Stop Time 0930    PT Time Calculation (min) 45 min    Activity Tolerance Patient tolerated treatment well    Behavior During Therapy [facility] for tasks assessed/performed          Past Medical History:  Diagnosis Date   Acquired clawfoot, right foot 12/16/2021   Anxiety    Atrophy of calf muscles 10/21/2021   Duplex calves was negative for significant blockage EMG was done at [facility] MRI lumbar spine was also done at [facility]   Carotid artery occlusion    CKD (chronic kidney disease)    Stage 3   COPD (chronic obstructive pulmonary disease) (HCC)    Depression    Dysrhythmia    PAF, atrial tachycardia   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   The spinal cord appears normal. 2.   No spinal stenosis. 3.   Mild multilevel degenerative changes as detailed above that do not lead to spinal stenosis or nerve root compression. 4.   T2 hyperintense foci within the pons consistent with chronic microvascular ischemic changes.   Loop - Medtronic Linq 10/22/2020 10/22/2020   Nocturia    Paroxysmal atrial fibrillation (HCC)    Peripheral neuropathy    Peripheral vascular disease    Sleep apnea    on 6 cm, nasal pillow   Spinal stenosis    Stroke (HCC) 05/01/2011   ischemic   Weakness generalized 10/21/2021   Severe, legs and arms   Past Surgical History:  Procedure Laterality Date   ENDARTERECTOMY  12/09/2011   Procedure: ENDARTERECTOMY CAROTID;  Surgeon: Krystal JULIANNA Doing, MD;   Location: [facility] OR;  Service: Vascular;  Laterality: Left;   EYE SURGERY     rt retina detachment   HAMMER TOE SURGERY  03/28/2002   HERNIA REPAIR  03/28/2004   I & D EXTREMITY Right 10/07/2022   Procedure: IRRIGATION AND DEBRIDEMENT EXTREMITY WITH WOUND CLOSURE;  Surgeon: Burnetta Aures, MD;  Location: [facility] ORS;  Service: Orthopedics;  Laterality: Right;   QUADRICEPS TENDON REPAIR Right 07/12/2022   Procedure: REPAIR QUADRICEP TENDON;  Surgeon: Ernie Cough, MD;  Location: [facility] ORS;  Service: Orthopedics;  Laterality: Right;   QUADRICEPS TENDON REPAIR Right 08/30/2022   Procedure: REPAIR QUADRICEP TENDON;  Surgeon: Ernie Cough, MD;  Location: [facility] OR[facility];  Service: Orthopedics;  Laterality: Right;   SHOULDER ARTHROSCOPY W/ ROTATOR CUFF REPAIR  03/28/2008   TONSILLECTOMY     Patient Active Problem List   Diagnosis Date Noted   History of stroke 02/06/2024   Coronary artery calcification 05/09/2023   COPD (chronic obstructive pulmonary disease) (HCC) 07/08/2022   Macular degeneration 03/01/2022   Lumbar spondylosis 01/03/2022   Acquired clawfoot, left foot 12/16/2021   Acquired clawfoot, right foot 12/16/2021   Spinal stenosis at L4-L5 level 12/10/2021   Ataxia 11/22/2021   Claudication of lower extremity 10/21/2021   Atrophy  of calf muscles 10/21/2021   Weakness generalized 10/21/2021   Kyphosis 10/21/2021   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   Disequilibrium 05/28/2021   S/P lumbar fusion 12/24/2020   Acquired complex renal cyst 11/23/2020   Functional gait abnormality 11/04/2020   Idiopathic neuropathy 11/02/2020   Loop - Medtronic Linq 10/22/2020 10/22/2020   Normocytic anemia 09/14/2020   Leukocytosis 09/14/2020   Hyponatremia 09/14/2020   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   Recurrent falls 11/17/2019   Paroxysmal atrial fibrillation (HCC) 10/22/2019   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   Urinary urgency 04/13/2018   Other intervertebral disc degeneration,  lumbar region 06/16/2017   Acquired bilateral hammer toes 10/05/2015   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   Generalized anxiety disorder 05/12/2015   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   Essential hypertension 04/10/2014   Occlusion and stenosis of carotid artery without mention of cerebral infarction 12/15/2011   Mixed hyperlipidemia     PCP: Bernardino Cone  REFERRING PROVIDER: Bernardino Cone  REFERRING DIAG: weakness, multiple falls  Rationale for Evaluation and Treatment: Rehabilitation  THERAPY DIAG:  Other abnormalities of gait and mobility  Repeated falls  Contracture of muscle, multiple sites  Acute pain of right knee  Muscle weakness (generalized)  ONSET DATE: 10/07/22  SUBJECTIVE:                                                                                                                                                                                           SUBJECTIVE STATEMENT: Doing ok, no falls, rode NuStep level 7, LE only for 4 of those min   Patient asking about plan, he reports that he is worried that he will not be able to stay independent and live at home if he stops, he has no equipment and with the right knee's inability to extend and the left knee having issues with buckling due to neuropathy, he is worried about more falls and the strain on his wife   PERTINENT HISTORY:  Cerebral infarct 10/21/21, lumbar fusion, recurrent falls, has significant neuropathy and this is the reason for most of his falls   PAIN:  Are you having pain? Yes: NPRS scale: 0/10 Pain location: right knee Pain description: ache  Aggravating factors: being on my feet  Relieving factors: movement  PRECAUTIONS: Fall  WEIGHT BEARING RESTRICTIONS: No  FALLS:  Has patient fallen in last 6 months? Yes. Number of falls every few days   LIVING ENVIRONMENT: Lives with: lives with their spouse Lives in: House/apartment Stairs: Yes:  Internal: 20 steps; on right going up Has following equipment at home: [name] - 4 wheeled, Wheelchair (manual),  shower chair, Shower bench, Grab bars, Ramped entry, and trekking pole  OCCUPATION: Retired  PLOF: Independent, prior to a year ago he was driving used trekking poles to walk  PATIENT GOALS: live at home  NEXT MD VISIT:   OBJECTIVE:   DIAGNOSTIC FINDINGS:    LUMBAR SPINE FINDINGS: 1. Wide laminectomy and posterior pedicle screw and rod fixation at L4-5 without residual or recurrent stenosis. 2. Progressive adjacent level disease at L3-4 with moderate right and mild left subarticular narrowing and moderate foraminal stenosis bilaterally. Progression is predominantly due to facet disease 3. Progressive moderate facet hypertrophy at L1-2 with a progressive rightward disc protrusion resulting in progressive moderate right foraminal stenosis. Asymmetric endplate marrow edema on the right at L1-2 is consistent with progressive disease as well. 4. Otherwise stable degenerative change without other focal stenosis.   THORACIC SPINE FINDINGS:  On sagittal views the vertebral bodies have normal height and alignment.  Hemangioma within T9 vertebral body and slightly within T10 vertebral body.  Anterior kyphosis.  Mild scoliosis convex right centered at T8 level. The spinal cord is normal in size and appearance. The paraspinal soft tissues are unremarkable.     On axial views there is no spinal stenosis or foraminal narrowing.  Limited views of the aorta, kidneys, liver, lungs and paraspinal muscles are notable for multiple large right renal cysts ranging in size from 1.5 to 7 cm.   COGNITION: Overall cognitive status: Within functional limits for tasks assessed     SENSATION: WFL  MUSCLE LENGTH: Hamstrings: mod tightness on both sides  POSTURE: rounded shoulders and forward head  LOWER EXTREMITY ROM:   the right knee has HS, has no quad tendon on the right, unable to lift  the leg, left leg WFL's , left hip and right hip and ankles WFL's   LOWER EXTREMITY MMT: 4-/5 for the LE's except for the right knee extension UPPER EXTREMITY MMT:  4-/5 for shoulders and elbows   FUNCTIONAL TESTS:  5XSTS: 83 seconds really has to use his hands and is unsteady with standing Cannot stand on his own without holding onto something due to poor balance   Transfers:  needs set up and MinA for safety, is impulsive and unsafe at times. GAIT: He is w/c dependent    TODAY'S TREATMENT:                                                                                                                              DATE:  02/20/24 In pbars 2.5# marches and hip abduction with PT blocking knees 45# HS curls, RLE 20lb 2x15 10# leg extension 15# rows 15# extension 15# chest press 10lb chest flys  25# biceps. 25# triceps Passive stretch left HS and piriformis  02/13/24 In pbars 2.5# marches and hip abduction with PT blocking knees 15# rows 15# extension 15# chest press 25# biceps. 25# triceps 45# HS curls, RLE 20lb 2x15 10# leg extension Seated trunk rotations blue  ball Passive right knee stretch for extension Passive stretch left HS and piriformis  02/05/24 In pbars 2.5# marches and hip abduction with PT blocking knees 15# rows 15# extension 15# chest press 25# biceps. 25# triceps 45# HS curls 10# leg extension Passive right knee stretch for extension Feet on ball K2C, rotation, bridges, isometric abs Ball b/n knees squeeze Blue tband clamshells Passive stretch left HS and piriformis  01/29/24 In pbars standing marching PT blocking knees And then hip abduction both with 2.5# Leg curls 45# 2x15 Leg extension left leg 10# 2x15 25# biceps curls 2x15 15# rows 15# extension 15# chest press with pulleys 25# triceps Feet on ball K2C, rotation, bridge isometric abs 2x15 Ball b/n knees squeeze Blue tband clamshells Passive stretch right knee for  extension   01/22/24 In pbars 2.5# marches and hip abduction 2x15 each with PT blocking knees Sit to stand in pbars working on pushing with left leg Leg press 20#, 40# and 60# x 15 each Leg curls 45# 2x15 and then x10 Leg extension 10# 3x12 All transfers in the gym require CGA to block knees 15# chest press 15# rows 15# straight arm pulls 25# triceps 20# biceps Feet on ball K2C, rotation, bridge, isometric abs Blue tband clamshells   01/15/24 In Pbars 2.5# marches and abduction  had to stop due to left hip pain 15# rows 2x15 15# straight arm pulls 10# chest press 20# biceps 25# triceps STM to the left hip, buttock, ITB Passive stretch to the left HS and piriformis Feet on ball K2C, rotation, small bridge, isometric abs Passive stretch of the right leg for knee extension Ball b/n knees squeeze Blue tband clamshells  01/08/24 Standing in Pbars PT blocking knees for 3# marches 3# hip abduction And calf raises 20# biceps 25# tricpes 15# row 15# extension 10# chest press 45# HS curls 10# left leg extension PAssive knee stretches Feet on ball K2C, rotation, bridge, isometric abs Ball b/n knees squeeze Blue tband clamshells  01/01/24 In Pbars marching and hip abduction 2.5# with PT blocking knees Leg curls 45# 2x15 Leg extension 10# 2x15 15# straight arm pulls 15# rows 25# biceps 25# triceps 15# chest press Feet on ball K2C, rotation, bridge, isometric abs Black tband clamshells Black tband right hip supine extension All transfers require CGA  12/26/23 In Pbars 2.5# marches 2.5# hip abduction 2x15 15# rows 15# # straight arm pulls 10# chest press 25# triceps 25# biceps 45# leg curls 10# leg ext LEg press 50#, then left only no weight Feet on ball K2C, rotation, bridge and isometric abs  12/18/23 15# straight arm pulls 15# rows 15# chest press 25# triceps 25# biceps Leg curls 45# 10# leg extension In Pbars 2.5# marches with PT blocking knees   Passive stretch right knee ext Feet on ball K2C, rotation, bridge, isometric abs   12/13/23 15# rows 15# straight arm pulls 20# biceps 20# triceps 10# chest press In Pbars, worked on standing, then some marching 3 sets 15 each leg with PT blocking the stance leg Passive stretch right knee into extension Feet on ball K2C, rotation, bridge, isometric abs Black tband clamshells Black tband hip extension supine Ball b/n knees squeeze with bottom raise  12/05/23 15# rows 15# straight arm pulls 45# leg curls the right only 20# LEft leg ext 10# 10# chest press 20# triceps  20# biceps Passive stretch of the right knee, passive HS and piriformis stretch bilaterally Feet on ball K2C, rotation, bridge, isometric abs Black tband clamshells  11/28/23 Standing in Pbars PT blocking knees weight shifts and marches 15# rows 15# straight arm pulls 20# triceps 20# biceps Leg press 50# Feet on ball K2C, rotation, bridges, isometric abs Black tband clamshess Ball squeeze with bridge  PATIENT EDUCATION:  Education details: POC Person educated: Patient Education method: Explanation Education comprehension: verbalized understanding  HOME EXERCISE PROGRAM:  ASSESSMENT:  CLINICAL IMPRESSION: Patient reports no falls since last session. Cues needed at times for eccentric controls with interventions. Chest fly's was really taxing on pt. Pt did state he did nt feel well post session reporting dizziness that resolved after rest. I continue to work with Mr Hussar regarding his current level and his ability to stay home and live independently with his wife.  His diagnosis is complicated in a way that requires continued skilled interventions to maintain his current level, he is unsafe with transfers and would not be able to do any significant exercise without skilled interventions as he has right knee that has a ruptured quad tendon causing him to be unable to bear weight on this side and has  significant neuropathy in both legs , again this causes transfers to be unsafe and unreliable as his knees will give at any time, he has had some falls at home and he feels that due to the PT interventions that he was able to get up on his own, not hurting his wife or having to have an ambulance come to the house and get him up.    WE continue to work on his overall health and hopefully his safety with transfers.    HE is doing the Nustep at home he is still having some hip pain, he does report not taking my advice fully about taking days off.  He still is unsafe at time with the transfers especially when he is fatigued, he is still impulsive and goes too fast at times. The right leg has no quad tendon and the left leg has neuropathy so he is at a very high risk for falls and for regression as he could not exercise much on his own and his wife cannot help him with the standing I feel CGA and blocking the knees are needed due to at any time the knees can buckle, we have tried to teach him to do hip extension to keep the knee locked but he does not do this.  We have set up the maintenance program following medicare guidelines.  He qualifies for this due to the inability to be safe, has had multiple falls and is impulsive, tends to think he can do more than he can.  Patient has not had a fall in the past 5 weeks, he remains very unsafe at times and with any transfer needs min A or close CGA.  Tends to not understand that the right knee has no quad to hold the leg straight.  I  In the long run he is impulsive and unsafe and cannot exercise on his own to maintain his level, he is at a high risk for falls and there is no gym that has the ability to assist him. Session completed at mat table to eliminate trunk support for improved core stabilization. Core weakness with seated OHP.  Pt did report a fall yesterday stated he was standing pulling his pants up, I advised pt that he shouldn't put weight in his RLE, He ws  adamant that he could put a little on it.     Justifying Skilled Maintenance for  Rehab Services:   The goal of a skilled maintenance therapy program is to maintain the patient's current functional status or to prevent or slow deterioration. If a patient who is receiving restorative therapy then requires skilled maintenance therapy based on the skills and judgment of the therapist, development of a maintenance program would occur during the last visit for restorative treatment.    Does the patient need treatment that necessitates skilled services that can only be provided by a skilled provider? Can a caregiver provide these services why or why not? 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  He is impulsive and at times very unsafe he could not do this on his own and his wife could not help him., history of COPD, CKD, depression, HTN, kyphosis, a-fib, PVD, spinal stenosis, and stroke as well as RC repair.  These limit the patient's independent carryover with PT  exercises and require max cueing for optimal performance of safety and to strengthen other mms for his future in therapy sessions.    The patient requires the skill of PT to help with ongoing exercise and mobility to prevent rapid and significant decline in functional mobility, strength, and to limit falls with his safety.  Therefore, continued skilled maintenance therapy is recommended for this patient.    OBJECTIVE IMPAIRMENTS: Abnormal gait, cardiopulmonary status limiting activity, decreased activity tolerance, decreased balance, decreased coordination, decreased endurance, decreased mobility, difficulty walking, decreased ROM, decreased strength, improper body mechanics, postural dysfunction, and poor safety.   REHAB POTENTIAL: Fair dependent on  carry over and patient compliance  CLINICAL DECISION MAKING: Evolving/moderate complexity  EVALUATION COMPLEXITY: Moderate  GOALS: Goals for skilled maintenance:     Long Term Goals: Target Date 12/13/23     The patient will preserve functional mobility within the home environment. Baseline: uses a w/c            Goal Status: met 01/15/24   2. The patient will maintain strength for transfers.            Baseline: able to perform with set up and at times requires mod A and at other time may need Max A due to poor safety and impulsiveness            Goal Status: progressing 01/22/24/25   3. The patient will minimize fall frequency.            Baseline: he has been having about a fall a month            Goal Status: progressing 01/22/24   4. Safe with HEP at home and with equipment he has            Baseline: does HEP             Goal Status: progressing 02/05/24   PLAN:  PT FREQUENCY: 1x/week  PT DURATION: 12 weeks  PLANNED INTERVENTIONS: Therapeutic exercises, Therapeutic activity, Neuromuscular re-education, Balance training, Gait training, Patient/Family education, Self Care, Joint mobilization, Stair training, Cryotherapy, Moist heat, and Manual therapy.  PLAN FOR NEXT SESSION: Have sat up the maintenance program for Mr. Reason and progressing with this, biggest issue he is unsafe and cannot do on his own or with his wife.  Trying to maintain his level of independence of living at  home with wife and safety  Tanda KANDICE Sorrow, PTA

## 2024-02-21 MED ORDER — FLECAINIDE ACETATE 50 MG PO TABS: 50.0000 mg | ORAL_TABLET | Freq: Two times a day (BID) | ORAL | 3 refills | Status: AC

## 2024-02-22 ENCOUNTER — Other Ambulatory Visit: Payer: Self-pay | Admitting: Internal Medicine

## 2024-02-22 DIAGNOSIS — E782 Mixed hyperlipidemia: Secondary | ICD-10-CM

## 2024-02-26 ENCOUNTER — Encounter: Payer: Self-pay | Admitting: Physical Therapy

## 2024-02-26 ENCOUNTER — Ambulatory Visit: Admitting: Physical Therapy

## 2024-02-26 DIAGNOSIS — M5459 Other low back pain: Secondary | ICD-10-CM | POA: Insufficient documentation

## 2024-02-26 DIAGNOSIS — M6249 Contracture of muscle, multiple sites: Secondary | ICD-10-CM | POA: Diagnosis present

## 2024-02-26 DIAGNOSIS — M25561 Pain in right knee: Secondary | ICD-10-CM | POA: Insufficient documentation

## 2024-02-26 DIAGNOSIS — R2689 Other abnormalities of gait and mobility: Secondary | ICD-10-CM | POA: Diagnosis present

## 2024-02-26 DIAGNOSIS — R296 Repeated falls: Secondary | ICD-10-CM | POA: Insufficient documentation

## 2024-02-26 DIAGNOSIS — M6281 Muscle weakness (generalized): Secondary | ICD-10-CM | POA: Insufficient documentation

## 2024-02-26 NOTE — Therapy (Signed)
 OUTPATIENT PHYSICAL THERAPY TREATMENT    Patient Name: Brandon Mendoza. MRN: [id number] DOB:1940-12-28, 83 y.o., male Today's Date: 02/26/2024  END OF SESSION:  PT End of Session - 02/26/24 0843     Visit Number [id number]    Date for Recertification  03/28/24    Authorization Type Medicare    PT Start Time 0844    PT Stop Time 0930    PT Time Calculation (min) 46 min    Activity Tolerance Patient tolerated treatment well    Behavior During Therapy [facility] for tasks assessed/performed          Past Medical History:  Diagnosis Date   Acquired clawfoot, right foot 12/16/2021   Anxiety    Atrophy of calf muscles 10/21/2021   Duplex calves was negative for significant blockage EMG was done at [facility] MRI lumbar spine was also done at [facility]   Carotid artery occlusion    CKD (chronic kidney disease)    Stage 3   COPD (chronic obstructive pulmonary disease) (HCC)    Depression    Dysrhythmia    PAF, atrial tachycardia   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   The spinal cord appears normal. 2.   No spinal stenosis. 3.   Mild multilevel degenerative changes as detailed above that do not lead to spinal stenosis or nerve root compression. 4.   T2 hyperintense foci within the pons consistent with chronic microvascular ischemic changes.   Loop - Medtronic Linq 10/22/2020 10/22/2020   Nocturia    Paroxysmal atrial fibrillation (HCC)    Peripheral neuropathy    Peripheral vascular disease    Sleep apnea    on 6 cm, nasal pillow   Spinal stenosis    Stroke (HCC) 05/01/2011   ischemic   Weakness generalized 10/21/2021   Severe, legs and arms   Past Surgical History:  Procedure Laterality Date   ENDARTERECTOMY  12/09/2011   Procedure: ENDARTERECTOMY  CAROTID;  Surgeon: Krystal JULIANNA Doing, MD;  Location: [facility] OR;  Service: Vascular;  Laterality: Left;   EYE SURGERY     rt retina detachment   HAMMER TOE SURGERY  03/28/2002   HERNIA REPAIR  03/28/2004   I & D EXTREMITY Right 10/07/2022   Procedure: IRRIGATION AND DEBRIDEMENT EXTREMITY WITH WOUND CLOSURE;  Surgeon: Burnetta Aures, MD;  Location: [facility] ORS;  Service: Orthopedics;  Laterality: Right;   QUADRICEPS TENDON REPAIR Right 07/12/2022   Procedure: REPAIR QUADRICEP TENDON;  Surgeon: Ernie Cough, MD;  Location: [facility] ORS;  Service: Orthopedics;  Laterality: Right;   QUADRICEPS TENDON REPAIR Right 08/30/2022   Procedure: REPAIR QUADRICEP TENDON;  Surgeon: Ernie Cough, MD;  Location: [facility] ORS;  Service: Orthopedics;  Laterality: Right;   SHOULDER ARTHROSCOPY W/ ROTATOR CUFF REPAIR  03/28/2008   TONSILLECTOMY     Patient Active Problem List   Diagnosis Date Noted   History of stroke 02/06/2024   Coronary artery calcification 05/09/2023   COPD (chronic obstructive pulmonary disease) (HCC) 07/08/2022   Macular degeneration 03/01/2022   Lumbar spondylosis 01/03/2022   Acquired clawfoot, left foot 12/16/2021   Acquired clawfoot, right foot 12/16/2021   Spinal stenosis at L4-L5 level 12/10/2021   Ataxia 11/22/2021   Claudication of  lower extremity 10/21/2021   Atrophy of calf muscles 10/21/2021   Weakness generalized 10/21/2021   Kyphosis 10/21/2021   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   Disequilibrium 05/28/2021   S/P lumbar fusion 12/24/2020   Acquired complex renal cyst 11/23/2020   Functional gait abnormality 11/04/2020   Idiopathic neuropathy 11/02/2020   Loop - Medtronic Linq 10/22/2020 10/22/2020   Normocytic anemia 09/14/2020   Leukocytosis 09/14/2020   Hyponatremia 09/14/2020   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   Recurrent falls 11/17/2019   Paroxysmal atrial fibrillation (HCC) 10/22/2019   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   Urinary urgency 04/13/2018   Other  intervertebral disc degeneration, lumbar region 06/16/2017   Acquired bilateral hammer toes 10/05/2015   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   Generalized anxiety disorder 05/12/2015   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   Essential hypertension 04/10/2014   Occlusion and stenosis of carotid artery without mention of cerebral infarction 12/15/2011   Mixed hyperlipidemia     PCP: Bernardino Cone  REFERRING PROVIDER: Bernardino Cone  REFERRING DIAG: weakness, multiple falls  Rationale for Evaluation and Treatment: Rehabilitation  THERAPY DIAG:  Other abnormalities of gait and mobility  Repeated falls  Contracture of muscle, multiple sites  Acute pain of right knee  Muscle weakness (generalized)  Other low back pain  ONSET DATE: 10/07/22  SUBJECTIVE:                                                                                                                                                                                           SUBJECTIVE STATEMENT: Reports a fall last week, was able to get up on own, reports that he is feeling depressed and having difficulty with transfers   Patient asking about plan, he reports that he is worried that he will not be able to stay independent and live at home if he stops, he has no equipment and with the right knee's inability to extend and the left knee having issues with buckling due to neuropathy, he is worried about more falls and the strain on his wife   PERTINENT HISTORY:  Cerebral infarct 10/21/21, lumbar fusion, recurrent falls, has significant neuropathy and this is the reason for most of his falls   PAIN:  Are you having pain? Yes: NPRS scale: 0/10 Pain location: right knee Pain description: ache  Aggravating factors: being on my feet  Relieving factors: movement  PRECAUTIONS: Fall  WEIGHT BEARING RESTRICTIONS: No  FALLS:  Has patient fallen in last 6 months? Yes. Number of falls every few days    LIVING ENVIRONMENT: Lives with: lives with their spouse Lives in: House/apartment Stairs: Yes:  Internal: 20 steps; on right going up Has following equipment at home: [name] - 4 wheeled, Wheelchair (manual), shower chair, Tour manager, Grab bars, Ramped entry, and trekking pole  OCCUPATION: Retired  PLOF: Independent, prior to a year ago he was driving used trekking poles to walk  PATIENT GOALS: live at home  NEXT MD VISIT:   OBJECTIVE:   DIAGNOSTIC FINDINGS:    LUMBAR SPINE FINDINGS: 1. Wide laminectomy and posterior pedicle screw and rod fixation at L4-5 without residual or recurrent stenosis. 2. Progressive adjacent level disease at L3-4 with moderate right and mild left subarticular narrowing and moderate foraminal stenosis bilaterally. Progression is predominantly due to facet disease 3. Progressive moderate facet hypertrophy at L1-2 with a progressive rightward disc protrusion resulting in progressive moderate right foraminal stenosis. Asymmetric endplate marrow edema on the right at L1-2 is consistent with progressive disease as well. 4. Otherwise stable degenerative change without other focal stenosis.   THORACIC SPINE FINDINGS:  On sagittal views the vertebral bodies have normal height and alignment.  Hemangioma within T9 vertebral body and slightly within T10 vertebral body.  Anterior kyphosis.  Mild scoliosis convex right centered at T8 level. The spinal cord is normal in size and appearance. The paraspinal soft tissues are unremarkable.     On axial views there is no spinal stenosis or foraminal narrowing.  Limited views of the aorta, kidneys, liver, lungs and paraspinal muscles are notable for multiple large right renal cysts ranging in size from 1.5 to 7 cm.   COGNITION: Overall cognitive status: Within functional limits for tasks assessed     SENSATION: WFL  MUSCLE LENGTH: Hamstrings: mod tightness on both sides  POSTURE: rounded shoulders and forward  head  LOWER EXTREMITY ROM:   the right knee has HS, has no quad tendon on the right, unable to lift the leg, left leg WFL's , left hip and right hip and ankles WFL's   LOWER EXTREMITY MMT: 4-/5 for the LE's except for the right knee extension UPPER EXTREMITY MMT:  4-/5 for shoulders and elbows   FUNCTIONAL TESTS:  5XSTS: 83 seconds really has to use his hands and is unsteady with standing Cannot stand on his own without holding onto something due to poor balance   Transfers:  needs set up and MinA for safety, is impulsive and unsafe at times. GAIT: He is w/c dependent    TODAY'S TREATMENT:                                                                                                                              DATE:  02/26/24 In pbars 2.5# marches and abduction PT blocking knees 2.5# seated marches 15# rows 15# straight arm pulls 15# chest press 25# triceps 25# biceps Transfer with Walker from w/c to mat table CGA 30 seconds Feet on ball K2C, rotation, bridges. Isometric abs Passive right knee stretch into extension Ball b/n knees squeeze Blue tband clamshells Gait with CGA  using FWW and close WC follow x 30 feet all weight are on hands  02/20/24 In pbars 2.5# marches and hip abduction with PT blocking knees 45# HS curls, RLE 20lb 2x15 10# leg extension 15# rows 15# extension 15# chest press 10lb chest flys  25# biceps. 25# triceps Passive stretch left HS and piriformis  02/13/24 In pbars 2.5# marches and hip abduction with PT blocking knees 15# rows 15# extension 15# chest press 25# biceps. 25# triceps 45# HS curls, RLE 20lb 2x15 10# leg extension Seated trunk rotations blue ball Passive right knee stretch for extension Passive stretch left HS and piriformis  02/05/24 In pbars 2.5# marches and hip abduction with PT blocking knees 15# rows 15# extension 15# chest press 25# biceps. 25# triceps 45# HS curls 10# leg extension Passive right knee  stretch for extension Feet on ball K2C, rotation, bridges, isometric abs Ball b/n knees squeeze Blue tband clamshells Passive stretch left HS and piriformis  01/29/24 In pbars standing marching PT blocking knees And then hip abduction both with 2.5# Leg curls 45# 2x15 Leg extension left leg 10# 2x15 25# biceps curls 2x15 15# rows 15# extension 15# chest press with pulleys 25# triceps Feet on ball K2C, rotation, bridge isometric abs 2x15 Ball b/n knees squeeze Blue tband clamshells Passive stretch right knee for extension   01/22/24 In pbars 2.5# marches and hip abduction 2x15 each with PT blocking knees Sit to stand in pbars working on pushing with left leg Leg press 20#, 40# and 60# x 15 each Leg curls 45# 2x15 and then x10 Leg extension 10# 3x12 All transfers in the gym require CGA to block knees 15# chest press 15# rows 15# straight arm pulls 25# triceps 20# biceps Feet on ball K2C, rotation, bridge, isometric abs Blue tband clamshells   01/15/24 In Pbars 2.5# marches and abduction  had to stop due to left hip pain 15# rows 2x15 15# straight arm pulls 10# chest press 20# biceps 25# triceps STM to the left hip, buttock, ITB Passive stretch to the left HS and piriformis Feet on ball K2C, rotation, small bridge, isometric abs Passive stretch of the right leg for knee extension Ball b/n knees squeeze Blue tband clamshells  01/08/24 Standing in Pbars PT blocking knees for 3# marches 3# hip abduction And calf raises 20# biceps 25# tricpes 15# row 15# extension 10# chest press 45# HS curls 10# left leg extension PAssive knee stretches Feet on ball K2C, rotation, bridge, isometric abs Ball b/n knees squeeze Blue tband clamshells  01/01/24 In Pbars marching and hip abduction 2.5# with PT blocking knees Leg curls 45# 2x15 Leg extension 10# 2x15 15# straight arm pulls 15# rows 25# biceps 25# triceps 15# chest press Feet on ball K2C, rotation, bridge,  isometric abs Black tband clamshells Black tband right hip supine extension All transfers require CGA  PATIENT EDUCATION:  Education details: POC Person educated: Patient Education method: Explanation Education comprehension: verbalized understanding  HOME EXERCISE PROGRAM:  ASSESSMENT:  CLINICAL IMPRESSION: Patient reports a fall last week, he was able to get up on his own.  HE reports that his legs just are feeling weak, he reports feeling depressed due to possibly having to move into a different place, I talked with him about speaking with his MD regarding depression. I did try walking with him today with close w/C follow and CGA, most weight was on his hands.  He does have a posterior walker at home that he is trying to use  some.  I continue to work with Mr Brandon Mendoza regarding his current level and his ability to stay home and live independently with his wife.  His diagnosis is complicated in a way that requires continued skilled interventions to maintain his current level, he is unsafe with transfers and would not be able to do any significant exercise without skilled interventions as he has right knee that has a ruptured quad tendon causing him to be unable to bear weight on this side and has significant neuropathy in both legs , again this causes transfers to be unsafe and unreliable as his knees will give at any time, he has had some falls at home and he feels that due to the PT interventions that he was able to get up on his own, not hurting his wife or having to have an ambulance come to the house and get him up.    WE continue to work on his overall health and hopefully his safety with transfers.    HE is doing the Nustep at home he is still having some hip pain, he does report not taking my advice fully about taking days off.  He still is unsafe at time with the transfers especially when he is fatigued, he is still impulsive and goes too fast at times. The right leg has no quad tendon and  the left leg has neuropathy so he is at a very high risk for falls and for regression as he could not exercise much on his own and his wife cannot help him with the standing I feel CGA and blocking the knees are needed due to at any time the knees can buckle, we have tried to teach him to do hip extension to keep the knee locked but he does not do this.  We have set up the maintenance program following medicare guidelines.  He qualifies for this due to the inability to be safe, has had multiple falls and is impulsive, tends to think he can do more than he can.  Patient has not had a fall in the past 5 weeks, he remains very unsafe at times and with any transfer needs min A or close CGA.  Tends to not understand that the right knee has no quad to hold the leg straight.  I  In the long run he is impulsive and unsafe and cannot exercise on his own to maintain his level, he is at a high risk for falls and there is no gym that has the ability to assist him. Session completed at mat table to eliminate trunk support for improved core stabilization. Core weakness with seated OHP.  Pt did report a fall yesterday stated he was standing pulling his pants up, I advised pt that he shouldn't put weight in his RLE, He ws adamant that he could put a little on it.     Justifying Skilled Maintenance for Rehab Services:   The goal of a skilled maintenance therapy program is to maintain the patient's current functional status or to prevent or slow deterioration. If a patient who is receiving restorative therapy then requires skilled maintenance therapy based on the skills and judgment of the therapist, development of a maintenance program would occur during the last visit for restorative treatment.    Does the patient need treatment that necessitates skilled services that can only be provided by a skilled provider? Can a caregiver provide these services why or why not? 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  He is impulsive and at times very unsafe he could not do this on his own and his wife could not help him., history of COPD, CKD, depression, HTN, kyphosis, a-fib, PVD, spinal stenosis, and stroke as well as RC repair.  These limit the patient's independent carryover with PT  exercises and require max cueing for optimal performance of safety and to strengthen other mms for his future in therapy sessions.    The patient requires the skill of PT to help with ongoing exercise and mobility to prevent rapid and significant decline in functional mobility, strength, and to limit falls with his safety.  Therefore, continued skilled maintenance therapy is recommended for this patient.    OBJECTIVE IMPAIRMENTS: Abnormal gait, cardiopulmonary status limiting activity, decreased activity tolerance, decreased balance, decreased coordination, decreased endurance, decreased mobility, difficulty walking, decreased ROM, decreased strength, improper body mechanics, postural dysfunction, and poor safety.   REHAB POTENTIAL: Fair dependent on carry over and patient compliance  CLINICAL DECISION MAKING: Evolving/moderate complexity  EVALUATION COMPLEXITY: Moderate  GOALS: Goals for skilled maintenance:     Long Term Goals: Target Date 12/13/23     The patient will preserve functional mobility within the home environment. Baseline: uses a w/c            Goal Status: met 01/15/24   2. The patient will maintain strength for transfers.            Baseline: able to perform with set up and at times requires mod A and at other time may need Max A due to poor safety and impulsiveness            Goal Status: progressing 02/26/24   3. The patient will minimize fall frequency.            Baseline: he has been having about a fall a month             Goal Status: progressing 02/26/24   4. Safe with HEP at home and with equipment he has            Baseline: does HEP             Goal Status: progressing 02/26/24   PLAN:  PT FREQUENCY: 1x/week  PT DURATION: 12 weeks  PLANNED INTERVENTIONS: Therapeutic exercises, Therapeutic activity, Neuromuscular re-education, Balance training, Gait training, Patient/Family education, Self Care, Joint mobilization, Stair training, Cryotherapy, Moist heat, and Manual therapy.  PLAN FOR NEXT SESSION: Have sat up the maintenance program for Mr. Forehand and progressing with this, biggest issue he is unsafe and cannot do on his own or with his wife.  Trying to maintain his level of independence of living at home with wife and safety  OBADIAH OZELL ORN, PT

## 2024-03-05 ENCOUNTER — Encounter: Payer: Self-pay | Admitting: Physical Therapy

## 2024-03-05 ENCOUNTER — Ambulatory Visit: Admitting: Physical Therapy

## 2024-03-05 DIAGNOSIS — M5459 Other low back pain: Secondary | ICD-10-CM

## 2024-03-05 DIAGNOSIS — M6249 Contracture of muscle, multiple sites: Secondary | ICD-10-CM

## 2024-03-05 DIAGNOSIS — M6281 Muscle weakness (generalized): Secondary | ICD-10-CM

## 2024-03-05 DIAGNOSIS — R296 Repeated falls: Secondary | ICD-10-CM

## 2024-03-05 DIAGNOSIS — M25561 Pain in right knee: Secondary | ICD-10-CM

## 2024-03-05 DIAGNOSIS — R2689 Other abnormalities of gait and mobility: Secondary | ICD-10-CM

## 2024-03-05 NOTE — Therapy (Signed)
 OUTPATIENT PHYSICAL THERAPY TREATMENT    Patient Name: Brandon Mendoza. MRN: [id number] DOB:[birth year], 83 y.o., male Today's Date: 03/05/2024  END OF SESSION:  PT End of Session - 03/05/24 0845     Visit Number [id number]    Date for Recertification  03/28/24    Authorization Type Medicare    PT Start Time 0842    PT Stop Time 0927    PT Time Calculation (min) 45 min    Activity Tolerance Patient tolerated treatment well    Behavior During Therapy [facility] for tasks assessed/performed          Past Medical History:  Diagnosis Date   Acquired clawfoot, right foot 12/16/2021   Anxiety    Atrophy of calf muscles 10/21/2021   Duplex calves was negative for significant blockage EMG was done at [facility] MRI lumbar spine was also done at [facility]   Carotid artery occlusion    CKD (chronic kidney disease)    Stage 3   COPD (chronic obstructive pulmonary disease) (HCC)    Depression    Dysrhythmia    PAF, atrial tachycardia   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   The spinal cord appears normal. 2.   No spinal stenosis. 3.   Mild multilevel degenerative changes as detailed above that do not lead to spinal stenosis or nerve root compression. 4.   T2 hyperintense foci within the pons consistent with chronic microvascular ischemic changes.   Loop - Medtronic Linq 10/22/2020 10/22/2020   Nocturia    Paroxysmal atrial fibrillation (HCC)    Peripheral neuropathy    Peripheral vascular disease    Sleep apnea    on 6 cm, nasal pillow   Spinal stenosis    Stroke (HCC) 05/01/2011   ischemic   Weakness generalized 10/21/2021   Severe, legs and arms   Past Surgical History:  Procedure Laterality Date   ENDARTERECTOMY  12/09/2011   Procedure: ENDARTERECTOMY  CAROTID;  Surgeon: Krystal JULIANNA Doing, MD;  Location: [facility] OR;  Service: Vascular;  Laterality: Left;   EYE SURGERY     rt retina detachment   HAMMER TOE SURGERY  03/28/2002   HERNIA REPAIR  03/28/2004   I & D EXTREMITY Right 10/07/2022   Procedure: IRRIGATION AND DEBRIDEMENT EXTREMITY WITH WOUND CLOSURE;  Surgeon: Burnetta Aures, MD;  Location: [facility] ORS;  Service: Orthopedics;  Laterality: Right;   QUADRICEPS TENDON REPAIR Right 07/12/2022   Procedure: REPAIR QUADRICEP TENDON;  Surgeon: Ernie Cough, MD;  Location: [facility];  Service: Orthopedics;  Laterality: Right;   QUADRICEPS TENDON REPAIR Right 08/30/2022   Procedure: REPAIR QUADRICEP TENDON;  Surgeon: Ernie Cough, MD;  Location: [facility];  Service: Orthopedics;  Laterality: Right;   SHOULDER ARTHROSCOPY W/ ROTATOR CUFF REPAIR  03/28/2008   TONSILLECTOMY     Patient Active Problem List   Diagnosis Date Noted   History of stroke 02/06/2024   Coronary artery calcification 05/09/2023   COPD (chronic obstructive pulmonary disease) (HCC) 07/08/2022   Macular degeneration 03/01/2022   Lumbar spondylosis 01/03/2022   Acquired clawfoot, left foot 12/16/2021   Acquired clawfoot, right foot 12/16/2021   Spinal stenosis at L4-L5 level 12/10/2021   Ataxia 11/22/2021   Claudication of  lower extremity 10/21/2021   Atrophy of calf muscles 10/21/2021   Weakness generalized 10/21/2021   Kyphosis 10/21/2021   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   Disequilibrium 05/28/2021   S/P lumbar fusion 12/24/2020   Acquired complex renal cyst 11/23/2020   Functional gait abnormality 11/04/2020   Idiopathic neuropathy 11/02/2020   Loop - Medtronic Linq 10/22/2020 10/22/2020   Normocytic anemia 09/14/2020   Leukocytosis 09/14/2020   Hyponatremia 09/14/2020   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   Recurrent falls 11/17/2019   Paroxysmal atrial fibrillation (HCC) 10/22/2019   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   Urinary urgency 04/13/2018   Other  intervertebral disc degeneration, lumbar region 06/16/2017   Acquired bilateral hammer toes 10/05/2015   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   Generalized anxiety disorder 05/12/2015   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   Essential hypertension 04/10/2014   Occlusion and stenosis of carotid artery without mention of cerebral infarction 12/15/2011   Mixed hyperlipidemia     PCP: Bernardino Cone  REFERRING PROVIDER: Bernardino Cone  REFERRING DIAG: weakness, multiple falls  Rationale for Evaluation and Treatment: Rehabilitation  THERAPY DIAG:  Other abnormalities of gait and mobility  Repeated falls  Contracture of muscle, multiple sites  Acute pain of right knee  Muscle weakness (generalized)  Other low back pain  ONSET DATE: 10/07/22  SUBJECTIVE:                                                                                                                                                                                           SUBJECTIVE STATEMENT: No falls, still doing Nustep at home   Patient asking about plan, he reports that he is worried that he will not be able to stay independent and live at home if he stops, he has no equipment and with the right knee's inability to extend and the left knee having issues with buckling due to neuropathy, he is worried about more falls and the strain on his wife   PERTINENT HISTORY:  Cerebral infarct 10/21/21, lumbar fusion, recurrent falls, has significant neuropathy and this is the reason for most of his falls   PAIN:  Are you having pain? Yes: NPRS scale: 0/10 Pain location: right knee Pain description: ache  Aggravating factors: being on my feet  Relieving factors: movement  PRECAUTIONS: Fall  WEIGHT BEARING RESTRICTIONS: No  FALLS:  Has patient fallen in last 6 months? Yes. Number of falls every few days   LIVING ENVIRONMENT: Lives with: lives with their spouse Lives in:  House/apartment Stairs: Yes: Internal: 20 steps; on right going up Has following equipment at home: [name] - 4 wheeled,  Wheelchair (manual), shower chair, Tour manager, Grab bars, Ramped entry, and trekking pole  OCCUPATION: Retired  PLOF: Independent, prior to a year ago he was driving used trekking poles to walk  PATIENT GOALS: live at home  NEXT MD VISIT:   OBJECTIVE:   DIAGNOSTIC FINDINGS:    LUMBAR SPINE FINDINGS: 1. Wide laminectomy and posterior pedicle screw and rod fixation at L4-5 without residual or recurrent stenosis. 2. Progressive adjacent level disease at L3-4 with moderate right and mild left subarticular narrowing and moderate foraminal stenosis bilaterally. Progression is predominantly due to facet disease 3. Progressive moderate facet hypertrophy at L1-2 with a progressive rightward disc protrusion resulting in progressive moderate right foraminal stenosis. Asymmetric endplate marrow edema on the right at L1-2 is consistent with progressive disease as well. 4. Otherwise stable degenerative change without other focal stenosis.   THORACIC SPINE FINDINGS:  On sagittal views the vertebral bodies have normal height and alignment.  Hemangioma within T9 vertebral body and slightly within T10 vertebral body.  Anterior kyphosis.  Mild scoliosis convex right centered at T8 level. The spinal cord is normal in size and appearance. The paraspinal soft tissues are unremarkable.     On axial views there is no spinal stenosis or foraminal narrowing.  Limited views of the aorta, kidneys, liver, lungs and paraspinal muscles are notable for multiple large right renal cysts ranging in size from 1.5 to 7 cm.   COGNITION: Overall cognitive status: Within functional limits for tasks assessed     SENSATION: WFL  MUSCLE LENGTH: Hamstrings: mod tightness on both sides  POSTURE: rounded shoulders and forward head  LOWER EXTREMITY ROM:   the right knee has HS, has no quad tendon  on the right, unable to lift the leg, left leg WFL's , left hip and right hip and ankles WFL's   LOWER EXTREMITY MMT: 4-/5 for the LE's except for the right knee extension UPPER EXTREMITY MMT:  4-/5 for shoulders and elbows   FUNCTIONAL TESTS:  5XSTS: 83 seconds really has to use his hands and is unsteady with standing Cannot stand on his own without holding onto something due to poor balance   Transfers:  needs set up and MinA for safety, is impulsive and unsafe at times. GAIT: He is w/c dependent    TODAY'S TREATMENT:                                                                                                                              DATE:  03/05/24 15# Row 15# straight arm pulls 2x15 15# chest press 25# triceps 25# biceps In pbars 2.5# marches and abduction in standing with PT blocking knees 45# HS curls 10# leg extension Feet on ball K2C, rotation, bridge, isometric abs Seated elbow touches  02/26/24 In pbars 2.5# marches and abduction PT blocking knees 2.5# seated marches 15# rows 15# straight arm pulls 15# chest press 25# triceps 25# biceps Transfer with Walker from w/c to  mat table CGA 30 seconds Feet on ball K2C, rotation, bridges. Isometric abs Passive right knee stretch into extension Ball b/n knees squeeze Blue tband clamshells Gait with CGA using FWW and close WC follow x 30 feet all weight are on hands  02/20/24 In pbars 2.5# marches and hip abduction with PT blocking knees 45# HS curls, RLE 20lb 2x15 10# leg extension 15# rows 15# extension 15# chest press 10lb chest flys  25# biceps. 25# triceps Passive stretch left HS and piriformis  02/13/24 In pbars 2.5# marches and hip abduction with PT blocking knees 15# rows 15# extension 15# chest press 25# biceps. 25# triceps 45# HS curls, RLE 20lb 2x15 10# leg extension Seated trunk rotations blue ball Passive right knee stretch for extension Passive stretch left HS and  piriformis  02/05/24 In pbars 2.5# marches and hip abduction with PT blocking knees 15# rows 15# extension 15# chest press 25# biceps. 25# triceps 45# HS curls 10# leg extension Passive right knee stretch for extension Feet on ball K2C, rotation, bridges, isometric abs Ball b/n knees squeeze Blue tband clamshells Passive stretch left HS and piriformis  01/29/24 In pbars standing marching PT blocking knees And then hip abduction both with 2.5# Leg curls 45# 2x15 Leg extension left leg 10# 2x15 25# biceps curls 2x15 15# rows 15# extension 15# chest press with pulleys 25# triceps Feet on ball K2C, rotation, bridge isometric abs 2x15 Ball b/n knees squeeze Blue tband clamshells Passive stretch right knee for extension   01/22/24 In pbars 2.5# marches and hip abduction 2x15 each with PT blocking knees Sit to stand in pbars working on pushing with left leg Leg press 20#, 40# and 60# x 15 each Leg curls 45# 2x15 and then x10 Leg extension 10# 3x12 All transfers in the gym require CGA to block knees 15# chest press 15# rows 15# straight arm pulls 25# triceps 20# biceps Feet on ball K2C, rotation, bridge, isometric abs Blue tband clamshells   01/15/24 In Pbars 2.5# marches and abduction  had to stop due to left hip pain 15# rows 2x15 15# straight arm pulls 10# chest press 20# biceps 25# triceps STM to the left hip, buttock, ITB Passive stretch to the left HS and piriformis Feet on ball K2C, rotation, small bridge, isometric abs Passive stretch of the right leg for knee extension Ball b/n knees squeeze Blue tband clamshells  01/08/24 Standing in Pbars PT blocking knees for 3# marches 3# hip abduction And calf raises 20# biceps 25# tricpes 15# row 15# extension 10# chest press 45# HS curls 10# left leg extension PAssive knee stretches Feet on ball K2C, rotation, bridge, isometric abs Ball b/n knees squeeze Blue tband clamshells  01/01/24 In Pbars  marching and hip abduction 2.5# with PT blocking knees Leg curls 45# 2x15 Leg extension 10# 2x15 15# straight arm pulls 15# rows 25# biceps 25# triceps 15# chest press Feet on ball K2C, rotation, bridge, isometric abs Black tband clamshells Black tband right hip supine extension All transfers require CGA  PATIENT EDUCATION:  Education details: POC Person educated: Patient Education method: Explanation Education comprehension: verbalized understanding  HOME EXERCISE PROGRAM:  ASSESSMENT:  CLINICAL IMPRESSION:  He does have a posterior walker at home that he is trying to use some.  I continue to work with Mr Dunlap regarding his current level and his ability to stay home and live independently with his wife.  His diagnosis is complicated in a way that requires continued skilled interventions to maintain  his current level, he is unsafe with transfers and would not be able to do any significant exercise without skilled interventions as he has right knee that has a ruptured quad tendon causing him to be unable to bear weight on this side and has significant neuropathy in both legs , again this causes transfers to be unsafe and unreliable as his knees will give at any time, he has had some falls at home and he feels that due to the PT interventions that he was able to get up on his own, not hurting his wife or having to have an ambulance come to the house and get him up.    WE continue to work on his overall health and hopefully his safety with transfers.    HE is doing the Nustep at home he is still having some hip pain, he does report not taking my advice fully about taking days off.  He still is unsafe at time with the transfers especially when he is fatigued, he is still impulsive and goes too fast at times. The right leg has no quad tendon and the left leg has neuropathy so he is at a very high risk for falls and for regression as he could not exercise much on his own and his wife cannot  help him with the standing I feel CGA and blocking the knees are needed due to at any time the knees can buckle, we have tried to teach him to do hip extension to keep the knee locked but he does not do this.  We have set up the maintenance program following medicare guidelines.  He qualifies for this due to the inability to be safe, has had multiple falls and is impulsive, tends to think he can do more than he can.  Patient has not had a fall in the past 5 weeks, he remains very unsafe at times and with any transfer needs min A or close CGA.  Tends to not understand that the right knee has no quad to hold the leg straight.  I  In the long run he is impulsive and unsafe and cannot exercise on his own to maintain his level, he is at a high risk for falls and there is no gym that has the ability to assist him. Session completed at mat table to eliminate trunk support for improved core stabilization. Core weakness with seated OHP.  Pt did report a fall yesterday stated he was standing pulling his pants up, I advised pt that he shouldn't put weight in his RLE, He ws adamant that he could put a little on it.     Justifying Skilled Maintenance for Rehab Services:   The goal of a skilled maintenance therapy program is to maintain the patient's current functional status or to prevent or slow deterioration. If a patient who is receiving restorative therapy then requires skilled maintenance therapy based on the skills and judgment of the therapist, development of a maintenance program would occur during the last visit for restorative treatment.    Does the patient need treatment that necessitates skilled services that can only be provided by a skilled provider? Can a caregiver provide these services why or why not? 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  He  is impulsive and at times very unsafe he could not do this on his own and his wife could not help him., history of COPD, CKD, depression, HTN, kyphosis, a-fib, PVD, spinal stenosis, and stroke as well as RC repair.  These limit the patient's independent carryover with PT  exercises and require max cueing for optimal performance of safety and to strengthen other mms for his future in therapy sessions.    The patient requires the skill of PT to help with ongoing exercise and mobility to prevent rapid and significant decline in functional mobility, strength, and to limit falls with his safety.  Therefore, continued skilled maintenance therapy is recommended for this patient.    OBJECTIVE IMPAIRMENTS: Abnormal gait, cardiopulmonary status limiting activity, decreased activity tolerance, decreased balance, decreased coordination, decreased endurance, decreased mobility, difficulty walking, decreased ROM, decreased strength, improper body mechanics, postural dysfunction, and poor safety.   REHAB POTENTIAL: Fair dependent on carry over and patient compliance  CLINICAL DECISION MAKING: Evolving/moderate complexity  EVALUATION COMPLEXITY: Moderate  GOALS: Goals for skilled maintenance:     Long Term Goals: Target Date 12/13/23     The patient will preserve functional mobility within the home environment. Baseline: uses a w/c            Goal Status: met 01/15/24   2. The patient will maintain strength for transfers.            Baseline: able to perform with set up and at times requires mod A and at other time may need Max A due to poor safety and impulsiveness            Goal Status: progressing 02/26/24   3. The patient will minimize fall frequency.            Baseline: he has been having about a fall a month            Goal Status: progressing 02/26/24   4. Safe with HEP at home and with equipment he has            Baseline: does HEP             Goal  Status: progressing 02/26/24   PLAN:  PT FREQUENCY: 1x/week  PT DURATION: 12 weeks  PLANNED INTERVENTIONS: Therapeutic exercises, Therapeutic activity, Neuromuscular re-education, Balance training, Gait training, Patient/Family education, Self Care, Joint mobilization, Stair training, Cryotherapy, Moist heat, and Manual therapy.  PLAN FOR NEXT SESSION: Have sat up the maintenance program for Mr. Kuennen and progressing with this, biggest issue he is unsafe and cannot do on his own or with his wife.  Trying to maintain his level of independence of living at home with wife and safety, may try VOR  OBADIAH OZELL ORN, PT

## 2024-03-11 ENCOUNTER — Encounter: Payer: Self-pay | Admitting: Physical Therapy

## 2024-03-11 ENCOUNTER — Ambulatory Visit: Admitting: Physical Therapy

## 2024-03-11 DIAGNOSIS — R296 Repeated falls: Secondary | ICD-10-CM

## 2024-03-11 DIAGNOSIS — R2689 Other abnormalities of gait and mobility: Secondary | ICD-10-CM | POA: Diagnosis not present

## 2024-03-11 DIAGNOSIS — M6249 Contracture of muscle, multiple sites: Secondary | ICD-10-CM

## 2024-03-11 DIAGNOSIS — M6281 Muscle weakness (generalized): Secondary | ICD-10-CM

## 2024-03-11 DIAGNOSIS — M25561 Pain in right knee: Secondary | ICD-10-CM

## 2024-03-11 DIAGNOSIS — M5459 Other low back pain: Secondary | ICD-10-CM

## 2024-03-11 NOTE — Therapy (Signed)
 OUTPATIENT PHYSICAL THERAPY TREATMENT  Progress Note Reporting Period 01/08/24 to 03/11/24  See note below for Objective Data and Assessment of Progress/Goals.      Patient Name: Brandon Mendoza. MRN: [id number] DOB:09-04-1940, 83 y.o., male Today's Date: 03/11/2024  END OF SESSION:  PT End of Session - 03/11/24 0854     Visit Number 60    Date for Recertification  03/28/24    Authorization Type Medicare    PT Start Time 0844    PT Stop Time 0928    PT Time Calculation (min) 44 min    Activity Tolerance Patient tolerated treatment well    Behavior During Therapy [facility] for tasks assessed/performed          Past Medical History:  Diagnosis Date   Acquired clawfoot, right foot 12/16/2021   Anxiety    Atrophy of calf muscles 10/21/2021   Duplex calves was negative for significant blockage EMG was done at [facility] MRI lumbar spine was also done at [facility]   Carotid artery occlusion    CKD (chronic kidney disease)    Stage 3   COPD (chronic obstructive pulmonary disease) (HCC)    Depression    Dysrhythmia    PAF, atrial tachycardia   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   The spinal cord appears normal. 2.   No spinal stenosis. 3.   Mild multilevel degenerative changes as detailed above that do not lead to spinal stenosis or nerve root compression. 4.   T2 hyperintense foci within the pons consistent with chronic microvascular ischemic changes.   Loop - Medtronic Linq 10/22/2020 10/22/2020   Nocturia    Paroxysmal atrial fibrillation (HCC)    Peripheral neuropathy    Peripheral vascular disease    Sleep apnea    on 6 cm, nasal pillow   Spinal stenosis    Stroke (HCC) 05/01/2011   ischemic   Weakness generalized 10/21/2021   Severe,  legs and arms   Past Surgical History:  Procedure Laterality Date   ENDARTERECTOMY  12/09/2011   Procedure: ENDARTERECTOMY CAROTID;  Surgeon: Krystal JULIANNA Doing, MD;  Location: [facility] OR;  Service: Vascular;  Laterality: Left;   EYE SURGERY     rt retina detachment   HAMMER TOE SURGERY  03/28/2002   HERNIA REPAIR  03/28/2004   I & D EXTREMITY Right 10/07/2022   Procedure: IRRIGATION AND DEBRIDEMENT EXTREMITY WITH WOUND CLOSURE;  Surgeon: Burnetta Aures, MD;  Location: [facility] ORS;  Service: Orthopedics;  Laterality: Right;   QUADRICEPS TENDON REPAIR Right 07/12/2022   Procedure: REPAIR QUADRICEP TENDON;  Surgeon: Ernie Cough, MD;  Location: [facility];  Service: Orthopedics;  Laterality: Right;   QUADRICEPS TENDON REPAIR Right 08/30/2022   Procedure: REPAIR QUADRICEP TENDON;  Surgeon: Ernie Cough, MD;  Location: [facility];  Service: Orthopedics;  Laterality: Right;   SHOULDER ARTHROSCOPY W/ ROTATOR CUFF REPAIR  03/28/2008   TONSILLECTOMY     Patient Active Problem List   Diagnosis Date Noted   History of stroke 02/06/2024   Coronary artery calcification 05/09/2023   COPD (chronic obstructive pulmonary disease) (HCC) 07/08/2022   Macular degeneration 03/01/2022   Lumbar spondylosis 01/03/2022   Acquired clawfoot, left foot 12/16/2021  Acquired clawfoot, right foot 12/16/2021   Spinal stenosis at L4-L5 level 12/10/2021   Ataxia 11/22/2021   Claudication of lower extremity 10/21/2021   Atrophy of calf muscles 10/21/2021   Weakness generalized 10/21/2021   Kyphosis 10/21/2021   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   Disequilibrium 05/28/2021   S/P lumbar fusion 12/24/2020   Acquired complex renal cyst 11/23/2020   Functional gait abnormality 11/04/2020   Idiopathic neuropathy 11/02/2020   Loop - Medtronic Linq 10/22/2020 10/22/2020   Normocytic anemia 09/14/2020   Leukocytosis 09/14/2020   Hyponatremia 09/14/2020   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   Recurrent falls 11/17/2019    Paroxysmal atrial fibrillation (HCC) 10/22/2019   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   Urinary urgency 04/13/2018   Other intervertebral disc degeneration, lumbar region 06/16/2017   Acquired bilateral hammer toes 10/05/2015   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   Generalized anxiety disorder 05/12/2015   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   Essential hypertension 04/10/2014   Occlusion and stenosis of carotid artery without mention of cerebral infarction 12/15/2011   Mixed hyperlipidemia     PCP: Bernardino Cone  REFERRING PROVIDER: Bernardino Cone  REFERRING DIAG: weakness, multiple falls  Rationale for Evaluation and Treatment: Rehabilitation  THERAPY DIAG:  Other abnormalities of gait and mobility  Repeated falls  Contracture of muscle, multiple sites  Other low back pain  Muscle weakness (generalized)  Acute pain of right knee  ONSET DATE: 10/07/22  SUBJECTIVE:                                                                                                                                                                                           SUBJECTIVE STATEMENT: Had a fall this weekend, was transferring from recliner to w/c   Patient asking about plan, he reports that he is worried that he will not be able to stay independent and live at home if he stops, he has no equipment and with the right knee's inability to extend and the left knee having issues with buckling due to neuropathy, he is worried about more falls and the strain on his wife   PERTINENT HISTORY:  Cerebral infarct 10/21/21, lumbar fusion, recurrent falls, has significant neuropathy and this is the reason for most of his falls   PAIN:  Are you having pain? Yes: NPRS scale: 0/10 Pain location: right knee Pain description: ache  Aggravating factors: being on my feet  Relieving factors: movement  PRECAUTIONS: Fall  WEIGHT BEARING RESTRICTIONS: No  FALLS:  Has  patient fallen in last 6 months? Yes. Number of falls every few days   LIVING ENVIRONMENT: Lives with:  lives with their spouse Lives in: House/apartment Stairs: Yes: Internal: 20 steps; on right going up Has following equipment at home: [name] - 4 wheeled, Wheelchair (manual), shower chair, Tour manager, Grab bars, Ramped entry, and trekking pole  OCCUPATION: Retired  PLOF: Independent, prior to a year ago he was driving used trekking poles to walk  PATIENT GOALS: live at home  NEXT MD VISIT:   OBJECTIVE:   DIAGNOSTIC FINDINGS:    LUMBAR SPINE FINDINGS: 1. Wide laminectomy and posterior pedicle screw and rod fixation at L4-5 without residual or recurrent stenosis. 2. Progressive adjacent level disease at L3-4 with moderate right and mild left subarticular narrowing and moderate foraminal stenosis bilaterally. Progression is predominantly due to facet disease 3. Progressive moderate facet hypertrophy at L1-2 with a progressive rightward disc protrusion resulting in progressive moderate right foraminal stenosis. Asymmetric endplate marrow edema on the right at L1-2 is consistent with progressive disease as well. 4. Otherwise stable degenerative change without other focal stenosis.   THORACIC SPINE FINDINGS:  On sagittal views the vertebral bodies have normal height and alignment.  Hemangioma within T9 vertebral body and slightly within T10 vertebral body.  Anterior kyphosis.  Mild scoliosis convex right centered at T8 level. The spinal cord is normal in size and appearance. The paraspinal soft tissues are unremarkable.     On axial views there is no spinal stenosis or foraminal narrowing.  Limited views of the aorta, kidneys, liver, lungs and paraspinal muscles are notable for multiple large right renal cysts ranging in size from 1.5 to 7 cm.   COGNITION: Overall cognitive status: Within functional limits for tasks assessed     SENSATION: WFL  MUSCLE LENGTH: Hamstrings:  mod tightness on both sides  POSTURE: rounded shoulders and forward head  LOWER EXTREMITY ROM:   the right knee has HS, has no quad tendon on the right, unable to lift the leg, left leg WFL's , left hip and right hip and ankles WFL's   LOWER EXTREMITY MMT: 4-/5 for the LE's except for the right knee extension UPPER EXTREMITY MMT:  4-/5 for shoulders and elbows   FUNCTIONAL TESTS:  5XSTS: 83 seconds really has to use his hands and is unsteady with standing Cannot stand on his own without holding onto something due to poor balance   Transfers:  needs set up and MinA for safety, is impulsive and unsafe at times. GAIT: He is w/c dependent    TODAY'S TREATMENT:                                                                                                                              DATE:  03/11/24 Standing pbars 2.5# marches and hip abduction with PT blocking knees 25# biceps 15# straight arm pulls 15# rows 15# chest press 25# triceps 45# HS curls 10#  left leg extension 40# leg press  all above 2 x15 reps Feet on ball K2C, rotation, bridges, isometric abs Passive right  knee extension Black tband clamshells  03/05/24 15# Row 15# straight arm pulls 2x15 15# chest press 25# triceps 25# biceps In pbars 2.5# marches and abduction in standing with PT blocking knees 45# HS curls 10# leg extension Feet on ball K2C, rotation, bridge, isometric abs Seated elbow touches  02/26/24 In pbars 2.5# marches and abduction PT blocking knees 2.5# seated marches 15# rows 15# straight arm pulls 15# chest press 25# triceps 25# biceps Transfer with Walker from w/c to mat table CGA 30 seconds Feet on ball K2C, rotation, bridges. Isometric abs Passive right knee stretch into extension Ball b/n knees squeeze Blue tband clamshells Gait with CGA using FWW and close WC follow x 30 feet all weight are on hands  02/20/24 In pbars 2.5# marches and hip abduction with PT blocking knees 45#  HS curls, RLE 20lb 2x15 10# leg extension 15# rows 15# extension 15# chest press 10lb chest flys  25# biceps. 25# triceps Passive stretch left HS and piriformis  02/13/24 In pbars 2.5# marches and hip abduction with PT blocking knees 15# rows 15# extension 15# chest press 25# biceps. 25# triceps 45# HS curls, RLE 20lb 2x15 10# leg extension Seated trunk rotations blue ball Passive right knee stretch for extension Passive stretch left HS and piriformis  02/05/24 In pbars 2.5# marches and hip abduction with PT blocking knees 15# rows 15# extension 15# chest press 25# biceps. 25# triceps 45# HS curls 10# leg extension Passive right knee stretch for extension Feet on ball K2C, rotation, bridges, isometric abs Ball b/n knees squeeze Blue tband clamshells Passive stretch left HS and piriformis  01/29/24 In pbars standing marching PT blocking knees And then hip abduction both with 2.5# Leg curls 45# 2x15 Leg extension left leg 10# 2x15 25# biceps curls 2x15 15# rows 15# extension 15# chest press with pulleys 25# triceps Feet on ball K2C, rotation, bridge isometric abs 2x15 Ball b/n knees squeeze Blue tband clamshells Passive stretch right knee for extension   01/22/24 In pbars 2.5# marches and hip abduction 2x15 each with PT blocking knees Sit to stand in pbars working on pushing with left leg Leg press 20#, 40# and 60# x 15 each Leg curls 45# 2x15 and then x10 Leg extension 10# 3x12 All transfers in the gym require CGA to block knees 15# chest press 15# rows 15# straight arm pulls 25# triceps 20# biceps Feet on ball K2C, rotation, bridge, isometric abs Blue tband clamshells   01/15/24 In Pbars 2.5# marches and abduction  had to stop due to left hip pain 15# rows 2x15 15# straight arm pulls 10# chest press 20# biceps 25# triceps STM to the left hip, buttock, ITB Passive stretch to the left HS and piriformis Feet on ball K2C, rotation, small  bridge, isometric abs Passive stretch of the right leg for knee extension Ball b/n knees squeeze Blue tband clamshells  01/08/24 Standing in Pbars PT blocking knees for 3# marches 3# hip abduction And calf raises 20# biceps 25# tricpes 15# row 15# extension 10# chest press 45# HS curls 10# left leg extension PAssive knee stretches Feet on ball K2C, rotation, bridge, isometric abs Ball b/n knees squeeze Blue tband clamshells  01/01/24 In Pbars marching and hip abduction 2.5# with PT blocking knees Leg curls 45# 2x15 Leg extension 10# 2x15 15# straight arm pulls 15# rows 25# biceps 25# triceps 15# chest press Feet on ball K2C, rotation, bridge, isometric abs Black tband clamshells Black tband right hip supine extension All transfers  require CGA  PATIENT EDUCATION:  Education details: POC Person educated: Patient Education method: Explanation Education comprehension: verbalized understanding  HOME EXERCISE PROGRAM:  ASSESSMENT:  CLINICAL IMPRESSION: Pt had a fall over the weekend getting from recliner to the w/c, I tried to question this and really try to find why, and it does seem like he did a set up and transfer correctly, just at times cannot pick up feet and is relying on his arms for the total lift   He does have a posterior walker at home that he is trying to use some.  I continue to work with Mr Merriweather regarding his current level and his ability to stay home and live independently with his wife.  His diagnosis is complicated in a way that requires continued skilled interventions to maintain his current level, he is unsafe with transfers and would not be able to do any significant exercise without skilled interventions as he has right knee that has a ruptured quad tendon causing him to be unable to bear weight on this side and has significant neuropathy in both legs , again this causes transfers to be unsafe and unreliable as his knees will give at any time, he has  had some falls at home and he feels that due to the PT interventions that he was able to get up on his own, not hurting his wife or having to have an ambulance come to the house and get him up.    WE continue to work on his overall health and hopefully his safety with transfers.    HE is doing the Nustep at home he is still having some hip pain, he does report not taking my advice fully about taking days off.  He still is unsafe at time with the transfers especially when he is fatigued, he is still impulsive and goes too fast at times. The right leg has no quad tendon and the left leg has neuropathy so he is at a very high risk for falls and for regression as he could not exercise much on his own and his wife cannot help him with the standing I feel CGA and blocking the knees are needed due to at any time the knees can buckle, we have tried to teach him to do hip extension to keep the knee locked but he does not do this.  We have set up the maintenance program following medicare guidelines.  He qualifies for this due to the inability to be safe, has had multiple falls and is impulsive, tends to think he can do more than he can.  Patient has not had a fall in the past 5 weeks, he remains very unsafe at times and with any transfer needs min A or close CGA.  Tends to not understand that the right knee has no quad to hold the leg straight.  I  In the long run he is impulsive and unsafe and cannot exercise on his own to maintain his level, he is at a high risk for falls and there is no gym that has the ability to assist him. Session completed at mat table to eliminate trunk support for improved core stabilization. Core weakness with seated OHP.  Pt did report a fall yesterday stated he was standing pulling his pants up, I advised pt that he shouldn't put weight in his RLE, He ws adamant that he could put a little on it.     Justifying Skilled Maintenance for Rehab Services:   The  goal of a skilled maintenance  therapy program is to maintain the patient's current functional status or to prevent or slow deterioration. If a patient who is receiving restorative therapy then requires skilled maintenance therapy based on the skills and judgment of the therapist, development of a maintenance program would occur during the last visit for restorative treatment.    Does the patient need treatment that necessitates skilled services that can only be provided by a skilled provider? Can a caregiver provide these services why or why not? 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  He is impulsive and at times very unsafe he could not do this on his own and his wife could not help him., history of COPD, CKD, depression, HTN, kyphosis, a-fib, PVD, spinal stenosis, and stroke as well as RC repair.  These limit the patient's independent carryover with PT  exercises and require max cueing for optimal performance of safety and to strengthen other mms for his future in therapy sessions.    The patient requires the skill of PT to help with ongoing exercise and mobility to prevent rapid and significant decline in functional mobility, strength, and to limit falls with his safety.  Therefore, continued skilled maintenance therapy is recommended for this patient.    OBJECTIVE IMPAIRMENTS: Abnormal gait, cardiopulmonary status limiting activity, decreased activity tolerance, decreased balance, decreased coordination, decreased endurance, decreased mobility, difficulty walking, decreased ROM, decreased strength, improper body mechanics, postural dysfunction, and poor safety.   REHAB POTENTIAL: Fair dependent on carry over and patient compliance  CLINICAL DECISION MAKING: Evolving/moderate complexity  EVALUATION COMPLEXITY:  Moderate  GOALS: Goals for skilled maintenance:     Long Term Goals: Target Date 12/13/23     The patient will preserve functional mobility within the home environment. Baseline: uses a w/c            Goal Status: met 01/15/24   2. The patient will maintain strength for transfers.            Baseline: able to perform with set up and at times requires mod A and at other time may need Max A due to poor safety and impulsiveness            Goal Status: progressing 02/26/24   3. The patient will minimize fall frequency.            Baseline: he has been having about a fall a month            Goal Status: progressing 02/26/24   4. Safe with HEP at home and with equipment he has            Baseline: does HEP             Goal Status: progressing 02/26/24   PLAN:  PT FREQUENCY: 1x/week  PT DURATION: 12 weeks  PLANNED INTERVENTIONS: Therapeutic exercises, Therapeutic activity, Neuromuscular re-education, Balance training, Gait training, Patient/Family education, Self Care, Joint mobilization, Stair training, Cryotherapy, Moist heat, and Manual therapy.  PLAN FOR NEXT SESSION: Have sat up the maintenance program for Mr. Nevills and progressing with this, biggest issue he is unsafe and cannot do on his own or with his wife.  Trying to maintain his level of independence of living at home with wife and  safety, may try VOR  OBADIAH OZELL ORN, PT

## 2024-03-12 ENCOUNTER — Encounter

## 2024-03-12 NOTE — Assessment & Plan Note (Signed)
 CT evidence of mild centrilobular emphysema. Morning cough likely related. We will follow this expectantly.

## 2024-03-12 NOTE — Assessment & Plan Note (Signed)
 I will check lipids today. Continue rosuvastatin 20 mg daily.

## 2024-03-12 NOTE — Assessment & Plan Note (Signed)
 Rate is well controlled. Continue flecainide  50 mg bid for rhythm control and aspirin  81 mg daily for stroke prevention.

## 2024-03-12 NOTE — Progress Notes (Signed)
 [facility] [address] [place] [address] Dept: [phone] Dept Fax: [phone]  Annual Physical Visit  Subjective:    Patient ID: Brandon VEAR Brandon Mendoza., male    DOB: [birth year], 83 y.o..   MRN: [id number]  No chief complaint on file.  History of Present Illness:  Patient is in today for an annual physical/preventative visit.  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 He underwent a carotid endarterectomy. He also has atrial fibrillation. He is managed on flecainide  50 mg bid for rhythm control and aspirin  81 mg daily for stroke prevention. He had been on a DOAC previously, but was felt to be at high risk for bleeding as he has had multiple falls.   Brandon Mendoza has a complicated history of low back pain, s/p lumbar fusion, a major quadriceps tendon tear with two prior surgeries and peripheral neuropathy leading to lower leg muscle wasting. He uses a wheel chair at times. he is engaged in a regular exercise routine to try and improve his strength. .  Brandon Mendoza has a history of hypertension. This is managed with losartan -hydrochlorothiazide  50 -12.5 mg daily.    Brandon Mendoza has a history of hyperlipidemia. This is managed with rosuvastatin  10 mg daily.    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 He is managed on sertraline  50 mg daily.  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   Disequilibrium 05/28/2021   S/P lumbar fusion 12/24/2020   Acquired complex renal cyst 11/23/2020   Functional gait abnormality 11/04/2020   Idiopathic neuropathy 11/02/2020   Loop - [employer] Linq 10/22/2020 10/22/2020   Normocytic anemia 09/14/2020   Leukocytosis 09/14/2020   Hyponatremia 09/14/2020   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   Recurrent falls 11/17/2019   Paroxysmal atrial fibrillation (HCC) 10/22/2019   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   Urinary urgency 04/13/2018   Other intervertebral disc degeneration, lumbar region 06/16/2017   Acquired bilateral hammer toes 10/05/2015   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   Generalized anxiety disorder 05/12/2015   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   Essential hypertension 04/10/2014   Occlusion and stenosis of carotid artery without mention of cerebral infarction 12/15/2011   Mixed hyperlipidemia    Past Surgical History:  Procedure Laterality Date   ENDARTERECTOMY  12/09/2011   Procedure: ENDARTERECTOMY CAROTID;  Surgeon: Krystal JULIANNA Doing, MD;  Location: [facility] OR;  Service: Vascular;  Laterality: Left;   EYE SURGERY     rt retina detachment   HAMMER TOE SURGERY  03/28/2002   HERNIA REPAIR  03/28/2004   I & D EXTREMITY Right 10/07/2022   Procedure: IRRIGATION AND DEBRIDEMENT EXTREMITY WITH WOUND CLOSURE;  Surgeon: Burnetta Aures, MD;  Location: [facility] ORS;  Service: Orthopedics;  Laterality: Right;   QUADRICEPS TENDON REPAIR Right 07/12/2022   Procedure: REPAIR QUADRICEP TENDON;  Surgeon: Ernie Cough, MD;  Location: [facility] ORS;  Service: Orthopedics;  Laterality: Right;  QUADRICEPS TENDON REPAIR Right 08/30/2022   Procedure: REPAIR QUADRICEP TENDON;  Surgeon: Ernie Cough, MD;  Location: [facility] ORS;  Service: Orthopedics;  Laterality: Right;   SHOULDER ARTHROSCOPY W/ ROTATOR CUFF REPAIR  03/28/2008   TONSILLECTOMY     Family History  Problem Relation Age  of Onset   Alzheimer's disease Father    Diabetes Maternal Uncle    Diabetes Other    Outpatient Medications Prior to Visit  Medication Sig Dispense Refill   aspirin  EC 81 MG tablet Take 81 mg by mouth daily. Swallow whole. Four times a week (Sunday, Monday, Wednesday and Friday).     flecainide  (TAMBOCOR ) 50 MG tablet Take 1 tablet (50 mg total) by mouth 2 (two) times daily. 180 tablet 3   losartan -hydrochlorothiazide  (HYZAAR) 50-12.5 MG tablet Take 1 tablet by mouth every morning. 90 tablet 4   melatonin 5 MG TABS 1 tablet in the evening Orally Once a day     rosuvastatin  (CRESTOR ) 10 MG tablet TAKE 1 TABLET EVERY EVENING 90 tablet 3   sertraline  (ZOLOFT ) 50 MG tablet TAKE AS INSTRUCTED BY YOUR PRESCRIBER 90 tablet 3   No facility-administered medications prior to visit.   Allergies[1] Objective:   There were no vitals filed for this visit. There is no height or weight on file to calculate BMI.   General: Well developed, well nourished. No acute distress. HEENT: Normocephalic, non-traumatic. PERRL, EOMI. Conjunctiva clear. External ears normal. EAC and TMs normal bilaterally. Nose    clear without congestion or rhinorrhea. Mucous membranes moist. Oropharynx clear. Good dentition. Neck: Supple. No lymphadenopathy. No thyromegaly. Lungs: Clear to auscultation bilaterally. No wheezing, rales or rhonchi. CV: RRR without murmurs or rubs. Pulses 2+ bilaterally. Abdomen: Soft, non-tender. Bowel sounds positive, normal pitch and frequency. No hepatosplenomegaly. No rebound or guarding. Back: Straight. No CVA tenderness bilaterally. Extremities: Full ROM. No joint swelling or tenderness. No edema noted. Skin: Warm and dry. No rashes. Neuro: CN II-XII intact. Normal sensation and DTR bilaterally. Psych: Alert and oriented. Normal mood and affect.  There are no preventive care reminders to display for this patient.  Lab Results {Labs (Optional):29002}  Lipid Panel:  Lab Results   Component Value Date   CHOL 129 03/24/2023   HDL 57.40 03/24/2023   LDLCALC 57 03/24/2023   TRIG 72.0 03/24/2023     Assessment & Plan:   Problem List Items Addressed This Visit   None   No follow-ups on file.   Garnette CHRISTELLA Simpler, MD  I,Emily Lagle,acting as a scribe for Garnette CHRISTELLA Simpler, MD.,have documented all relevant documentation on the behalf of Garnette CHRISTELLA Simpler, MD.  I, Garnette CHRISTELLA Simpler, MD, have reviewed all documentation for this visit. The documentation on 03/13/2024 for the exam, diagnosis, procedures, and orders are all accurate and complete.    [1] No Known Allergies

## 2024-03-12 NOTE — Assessment & Plan Note (Signed)
 Blood pressure is in good control. Continue losartan -hydrochlorothiazide  50-12.5 mg daily. I will check renal labs today.

## 2024-03-13 ENCOUNTER — Ambulatory Visit: Payer: Self-pay | Admitting: Family Medicine

## 2024-03-13 ENCOUNTER — Ambulatory Visit: Admitting: Family Medicine

## 2024-03-13 ENCOUNTER — Ambulatory Visit

## 2024-03-13 ENCOUNTER — Encounter: Payer: Self-pay | Admitting: Family Medicine

## 2024-03-13 VITALS — BP 122/70 | HR 65 | Temp 97.5°F | Ht 68.0 in | Wt 184.0 lb

## 2024-03-13 DIAGNOSIS — E782 Mixed hyperlipidemia: Secondary | ICD-10-CM | POA: Diagnosis not present

## 2024-03-13 DIAGNOSIS — I10 Essential (primary) hypertension: Secondary | ICD-10-CM

## 2024-03-13 DIAGNOSIS — F331 Major depressive disorder, recurrent, moderate: Secondary | ICD-10-CM | POA: Diagnosis not present

## 2024-03-13 DIAGNOSIS — I480 Paroxysmal atrial fibrillation: Secondary | ICD-10-CM

## 2024-03-13 DIAGNOSIS — J449 Chronic obstructive pulmonary disease, unspecified: Secondary | ICD-10-CM

## 2024-03-13 LAB — COMPREHENSIVE METABOLIC PANEL WITH GFR
ALT: 14 U/L (ref 3–53)
AST: 19 U/L (ref 5–37)
Albumin: 4.3 g/dL (ref 3.5–5.2)
Alkaline Phosphatase: 88 U/L (ref 39–117)
BUN: 24 mg/dL — ABNORMAL HIGH (ref 6–23)
CO2: 26 meq/L (ref 19–32)
Calcium: 9.2 mg/dL (ref 8.4–10.5)
Chloride: 100 meq/L (ref 96–112)
Creatinine, Ser: 1.09 mg/dL (ref 0.40–1.50)
GFR: 62.22 mL/min (ref >60.00)
Glucose, Bld: 93 mg/dL (ref 70–99)
Potassium: 3.9 meq/L (ref 3.5–5.1)
Sodium: 136 meq/L (ref 135–145)
Total Bilirubin: 0.5 mg/dL (ref 0.2–1.2)
Total Protein: 7.1 g/dL (ref 6.0–8.3)

## 2024-03-13 LAB — LIPID PANEL
Cholesterol: 122 mg/dL (ref 28–200)
HDL: 55.3 mg/dL (ref >39.00)
LDL Cholesterol: 56 mg/dL (ref 10–99)
NonHDL: 66.97
Total CHOL/HDL Ratio: 2
Triglycerides: 56 mg/dL (ref 10.0–149.0)
VLDL: 11.2 mg/dL (ref 0.0–40.0)

## 2024-03-13 MED ORDER — SERTRALINE HCL 100 MG PO TABS: 100.0000 mg | ORAL_TABLET | Freq: Every day | ORAL | 3 refills | Status: AC

## 2024-03-13 NOTE — Assessment & Plan Note (Signed)
 We discussed trying to increase his dose of sertraline  back to 100 mg daily.

## 2024-03-15 ENCOUNTER — Ambulatory Visit: Payer: Self-pay | Admitting: Cardiology

## 2024-03-16 ENCOUNTER — Ambulatory Visit

## 2024-03-16 DIAGNOSIS — I480 Paroxysmal atrial fibrillation: Secondary | ICD-10-CM

## 2024-03-17 ENCOUNTER — Ambulatory Visit: Payer: Self-pay | Admitting: Cardiology

## 2024-03-17 LAB — CUP PACEART REMOTE DEVICE CHECK
Date Time Interrogation Session: 20251219231738
Implantable Pulse Generator Implant Date: 20220728

## 2024-03-18 ENCOUNTER — Encounter: Payer: Self-pay | Admitting: Physical Therapy

## 2024-03-18 ENCOUNTER — Ambulatory Visit: Admitting: Physical Therapy

## 2024-03-18 DIAGNOSIS — R2689 Other abnormalities of gait and mobility: Secondary | ICD-10-CM

## 2024-03-18 DIAGNOSIS — M6249 Contracture of muscle, multiple sites: Secondary | ICD-10-CM

## 2024-03-18 DIAGNOSIS — M25561 Pain in right knee: Secondary | ICD-10-CM

## 2024-03-18 DIAGNOSIS — M6281 Muscle weakness (generalized): Secondary | ICD-10-CM

## 2024-03-18 DIAGNOSIS — M5459 Other low back pain: Secondary | ICD-10-CM

## 2024-03-18 DIAGNOSIS — R296 Repeated falls: Secondary | ICD-10-CM

## 2024-03-18 NOTE — Therapy (Signed)
 " OUTPATIENT PHYSICAL THERAPY TREATMENT    Patient Name: Brandon Mendoza. MRN: [id number] DOB:[birth year], 83 y.o., male Today's Date: 03/18/2024  END OF SESSION:  PT End of Session - 03/18/24 0841     Visit Number 61    Authorization Type Medicare    PT Start Time 0841    PT Stop Time 0927    PT Time Calculation (min) 46 min    Activity Tolerance Patient tolerated treatment well    Behavior During Therapy [facility] for tasks assessed/performed          Past Medical History:  Diagnosis Date   Acquired clawfoot, right foot 12/16/2021   Anxiety    Atrophy of calf muscles 10/21/2021   Duplex calves was negative for significant blockage EMG was done at [facility] MRI lumbar spine was also done at [facility]   Carotid artery occlusion    CKD (chronic kidney disease)    Stage 3   COPD (chronic obstructive pulmonary disease) (HCC)    Depression    Dysrhythmia    PAF, atrial tachycardia   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   The spinal cord appears normal. 2.   No spinal stenosis. 3.   Mild multilevel degenerative changes as detailed above that do not lead to spinal stenosis or nerve root compression. 4.   T2 hyperintense foci within the pons consistent with chronic microvascular ischemic changes.   Loop - Medtronic Linq 10/22/2020 10/22/2020   Nocturia    Paroxysmal atrial fibrillation (HCC)    Peripheral neuropathy    Peripheral vascular disease    Sleep apnea    on 6 cm, nasal pillow   Spinal stenosis    Stroke (HCC) 05/01/2011   ischemic   Weakness generalized 10/21/2021   Severe, legs and arms   Past Surgical History:  Procedure Laterality Date   ENDARTERECTOMY  12/09/2011   Procedure: ENDARTERECTOMY CAROTID;  Surgeon: Krystal JULIANNA Doing, MD;   Location: [facility] OR;  Service: Vascular;  Laterality: Left;   EYE SURGERY     rt retina detachment   HAMMER TOE SURGERY  03/28/2002   HERNIA REPAIR  03/28/2004   I & D EXTREMITY Right 10/07/2022   Procedure: IRRIGATION AND DEBRIDEMENT EXTREMITY WITH WOUND CLOSURE;  Surgeon: Burnetta Aures, MD;  Location: [facility] ORS;  Service: Orthopedics;  Laterality: Right;   QUADRICEPS TENDON REPAIR Right 07/12/2022   Procedure: REPAIR QUADRICEP TENDON;  Surgeon: Ernie Cough, MD;  Location: [facility] ORS;  Service: Orthopedics;  Laterality: Right;   QUADRICEPS TENDON REPAIR Right 08/30/2022   Procedure: REPAIR QUADRICEP TENDON;  Surgeon: Ernie Cough, MD;  Location: [facility] ORS;  Service: Orthopedics;  Laterality: Right;   SHOULDER ARTHROSCOPY W/ ROTATOR CUFF REPAIR  03/28/2008   TONSILLECTOMY     Patient Active Problem List   Diagnosis Date Noted   History of stroke 02/06/2024   Coronary artery calcification 05/09/2023   COPD (chronic obstructive pulmonary disease) (HCC) 07/08/2022   Macular degeneration 03/01/2022   Lumbar spondylosis 01/03/2022   Acquired clawfoot, left foot 12/16/2021   Acquired clawfoot, right foot 12/16/2021   Spinal stenosis at L4-L5 level 12/10/2021   Ataxia 11/22/2021   Claudication of lower extremity 10/21/2021   Atrophy of  calf muscles 10/21/2021   Weakness generalized 10/21/2021   Kyphosis 10/21/2021   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   Disequilibrium 05/28/2021   S/P lumbar fusion 12/24/2020   Acquired complex renal cyst 11/23/2020   Functional gait abnormality 11/04/2020   Idiopathic neuropathy 11/02/2020   Loop - Medtronic Linq 10/22/2020 10/22/2020   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   Recurrent falls 11/17/2019   Paroxysmal atrial fibrillation (HCC) 10/22/2019   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   Urinary urgency 04/13/2018   Other intervertebral disc degeneration, lumbar region 06/16/2017   Acquired bilateral hammer toes 10/05/2015   Primary  osteoarthritis involving multiple joints 10/05/2015   Generalized anxiety disorder 05/12/2015   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   Essential hypertension 04/10/2014   Occlusion and stenosis of carotid artery without mention of cerebral infarction 12/15/2011   Mixed hyperlipidemia     PCP: Bernardino Cone  REFERRING PROVIDER: Bernardino Cone  REFERRING DIAG: weakness, multiple falls  Rationale for Evaluation and Treatment: Rehabilitation  THERAPY DIAG:  Other abnormalities of gait and mobility  Repeated falls  Other low back pain  Muscle weakness (generalized)  Contracture of muscle, multiple sites  Acute pain of right knee  ONSET DATE: 10/07/22  SUBJECTIVE:                                                                                                                                                                                           SUBJECTIVE STATEMENT: No falls, feeling pretty good   Patient asking about plan, he reports that he is worried that he will not be able to stay independent and live at home if he stops, he has no equipment and with the right knee's inability to extend and the left knee having issues with buckling due to neuropathy, he is worried about more falls and the strain on his wife   PERTINENT HISTORY:  Cerebral infarct 10/21/21, lumbar fusion, recurrent falls, has significant neuropathy and this is the reason for most of his falls   PAIN:  Are you having pain? Yes: NPRS scale: 0/10 Pain location: right knee Pain description: ache  Aggravating factors: being on my feet  Relieving factors: movement  PRECAUTIONS: Fall  WEIGHT BEARING RESTRICTIONS: No  FALLS:  Has patient fallen in last 6 months? Yes. Number of falls every few days   LIVING ENVIRONMENT: Lives with: lives with their spouse Lives in: House/apartment Stairs: Yes: Internal: 20 steps; on right going up Has following equipment at home: [name] - 4 wheeled, Wheelchair  (manual), shower chair, Tour manager, Grab bars, Ramped entry, and trekking pole  OCCUPATION: Retired  PLOF: Independent, prior to a  year ago he was driving used trekking poles to walk  PATIENT GOALS: live at home  NEXT MD VISIT:   OBJECTIVE:   DIAGNOSTIC FINDINGS:    LUMBAR SPINE FINDINGS: 1. Wide laminectomy and posterior pedicle screw and rod fixation at L4-5 without residual or recurrent stenosis. 2. Progressive adjacent level disease at L3-4 with moderate right and mild left subarticular narrowing and moderate foraminal stenosis bilaterally. Progression is predominantly due to facet disease 3. Progressive moderate facet hypertrophy at L1-2 with a progressive rightward disc protrusion resulting in progressive moderate right foraminal stenosis. Asymmetric endplate marrow edema on the right at L1-2 is consistent with progressive disease as well. 4. Otherwise stable degenerative change without other focal stenosis.   THORACIC SPINE FINDINGS:  On sagittal views the vertebral bodies have normal height and alignment.  Hemangioma within T9 vertebral body and slightly within T10 vertebral body.  Anterior kyphosis.  Mild scoliosis convex right centered at T8 level. The spinal cord is normal in size and appearance. The paraspinal soft tissues are unremarkable.     On axial views there is no spinal stenosis or foraminal narrowing.  Limited views of the aorta, kidneys, liver, lungs and paraspinal muscles are notable for multiple large right renal cysts ranging in size from 1.5 to 7 cm.   COGNITION: Overall cognitive status: Within functional limits for tasks assessed     SENSATION: WFL  MUSCLE LENGTH: Hamstrings: mod tightness on both sides  POSTURE: rounded shoulders and forward head  LOWER EXTREMITY ROM:   the right knee has HS, has no quad tendon on the right, unable to lift the leg, left leg WFL's , left hip and right hip and ankles WFL's   LOWER EXTREMITY MMT: 4-/5 for  the LE's except for the right knee extension UPPER EXTREMITY MMT:  4-/5 for shoulders and elbows   FUNCTIONAL TESTS:  5XSTS: 83 seconds really has to use his hands and is unsteady with standing Cannot stand on his own without holding onto something due to poor balance   Transfers:  needs set up and MinA for safety, is impulsive and unsafe at times. GAIT: He is w/c dependent    TODAY'S TREATMENT:                                                                                                                              DATE:  03/18/24 2.5# standing in Pbars PT blocking knees marches and abduction 25# triceps 25# biceps 15# rows 15# chest press 15# straight arm pulls 45# HS curls, 2x15, then right only 25# 2x10 10# leg ext Leg press 40# 15# AR press Feet on ball K2C, rotation, bridge, isometric abs Black tband clamshells  03/11/24 Standing pbars 2.5# marches and hip abduction with PT blocking knees 25# biceps 15# straight arm pulls 15# rows 15# chest press 25# triceps 45# HS curls 10#  left leg extension 40# leg press  all above 2 x15 reps Feet on ball  K2C, rotation, bridges, isometric abs Passive right knee extension Black tband clamshells  03/05/24 15# Row 15# straight arm pulls 2x15 15# chest press 25# triceps 25# biceps In pbars 2.5# marches and abduction in standing with PT blocking knees 45# HS curls 10# leg extension Feet on ball K2C, rotation, bridge, isometric abs Seated elbow touches  02/26/24 In pbars 2.5# marches and abduction PT blocking knees 2.5# seated marches 15# rows 15# straight arm pulls 15# chest press 25# triceps 25# biceps Transfer with Walker from w/c to mat table CGA 30 seconds Feet on ball K2C, rotation, bridges. Isometric abs Passive right knee stretch into extension Ball b/n knees squeeze Blue tband clamshells Gait with CGA using FWW and close WC follow x 30 feet all weight are on hands  02/20/24 In pbars 2.5# marches and  hip abduction with PT blocking knees 45# HS curls, RLE 20lb 2x15 10# leg extension 15# rows 15# extension 15# chest press 10lb chest flys  25# biceps. 25# triceps Passive stretch left HS and piriformis  02/13/24 In pbars 2.5# marches and hip abduction with PT blocking knees 15# rows 15# extension 15# chest press 25# biceps. 25# triceps 45# HS curls, RLE 20lb 2x15 10# leg extension Seated trunk rotations blue ball Passive right knee stretch for extension Passive stretch left HS and piriformis  02/05/24 In pbars 2.5# marches and hip abduction with PT blocking knees 15# rows 15# extension 15# chest press 25# biceps. 25# triceps 45# HS curls 10# leg extension Passive right knee stretch for extension Feet on ball K2C, rotation, bridges, isometric abs Ball b/n knees squeeze Blue tband clamshells Passive stretch left HS and piriformis  01/29/24 In pbars standing marching PT blocking knees And then hip abduction both with 2.5# Leg curls 45# 2x15 Leg extension left leg 10# 2x15 25# biceps curls 2x15 15# rows 15# extension 15# chest press with pulleys 25# triceps Feet on ball K2C, rotation, bridge isometric abs 2x15 Ball b/n knees squeeze Blue tband clamshells Passive stretch right knee for extension   01/22/24 In pbars 2.5# marches and hip abduction 2x15 each with PT blocking knees Sit to stand in pbars working on pushing with left leg Leg press 20#, 40# and 60# x 15 each Leg curls 45# 2x15 and then x10 Leg extension 10# 3x12 All transfers in the gym require CGA to block knees 15# chest press 15# rows 15# straight arm pulls 25# triceps 20# biceps Feet on ball K2C, rotation, bridge, isometric abs Blue tband clamshells   01/15/24 In Pbars 2.5# marches and abduction  had to stop due to left hip pain 15# rows 2x15 15# straight arm pulls 10# chest press 20# biceps 25# triceps STM to the left hip, buttock, ITB Passive stretch to the left HS and  piriformis Feet on ball K2C, rotation, small bridge, isometric abs Passive stretch of the right leg for knee extension Ball b/n knees squeeze Blue tband clamshells  01/08/24 Standing in Pbars PT blocking knees for 3# marches 3# hip abduction And calf raises 20# biceps 25# tricpes 15# row 15# extension 10# chest press 45# HS curls 10# left leg extension PAssive knee stretches Feet on ball K2C, rotation, bridge, isometric abs Ball b/n knees squeeze Blue tband clamshells  01/01/24 In Pbars marching and hip abduction 2.5# with PT blocking knees Leg curls 45# 2x15 Leg extension 10# 2x15 15# straight arm pulls 15# rows 25# biceps 25# triceps 15# chest press Feet on ball K2C, rotation, bridge, isometric abs Black tband clamshells Black  tband right hip supine extension All transfers require CGA  PATIENT EDUCATION:  Education details: POC Person educated: Patient Education method: Explanation Education comprehension: verbalized understanding  HOME EXERCISE PROGRAM:  ASSESSMENT:  CLINICAL IMPRESSION: Patient doing well today all transfers require set up and min A just for safety, I did add AR press today, this was tough on him   He does have a posterior walker at home that he is trying to use some.  I continue to work with Brandon Mendoza regarding his current level and his ability to stay home and live independently with his wife.  His diagnosis is complicated in a way that requires continued skilled interventions to maintain his current level, he is unsafe with transfers and would not be able to do any significant exercise without skilled interventions as he has right knee that has a ruptured quad tendon causing him to be unable to bear weight on this side and has significant neuropathy in both legs , again this causes transfers to be unsafe and unreliable as his knees will give at any time, he has had some falls at home and he feels that due to the PT interventions that he was able  to get up on his own, not hurting his wife or having to have an ambulance come to the house and get him up.    WE continue to work on his overall health and hopefully his safety with transfers.    HE is doing the Nustep at home he is still having some hip pain, he does report not taking my advice fully about taking days off.  He still is unsafe at time with the transfers especially when he is fatigued, he is still impulsive and goes too fast at times. The right leg has no quad tendon and the left leg has neuropathy so he is at a very high risk for falls and for regression as he could not exercise much on his own and his wife cannot help him with the standing I feel CGA and blocking the knees are needed due to at any time the knees can buckle, we have tried to teach him to do hip extension to keep the knee locked but he does not do this.  We have set up the maintenance program following medicare guidelines.  He qualifies for this due to the inability to be safe, has had multiple falls and is impulsive, tends to think he can do more than he can.  Patient has not had a fall in the past 5 weeks, he remains very unsafe at times and with any transfer needs min A or close CGA.  Tends to not understand that the right knee has no quad to hold the leg straight.  I  In the long run he is impulsive and unsafe and cannot exercise on his own to maintain his level, he is at a high risk for falls and there is no gym that has the ability to assist him. Session completed at mat table to eliminate trunk support for improved core stabilization. Core weakness with seated OHP.  Pt did report a fall yesterday stated he was standing pulling his pants up, I advised pt that he shouldn't put weight in his RLE, He ws adamant that he could put a little on it.     Justifying Skilled Maintenance for Rehab Services:   The goal of a skilled maintenance therapy program is to maintain the patient's current functional status or to prevent or  slow  deterioration. If a patient who is receiving restorative therapy then requires skilled maintenance therapy based on the skills and judgment of the therapist, development of a maintenance program would occur during the last visit for restorative treatment.    Does the patient need treatment that necessitates skilled services that can only be provided by a skilled provider? Can a caregiver provide these services why or why not? 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  He is impulsive and at times very unsafe he could not do this on his own and his wife could not help him., history of COPD, CKD, depression, HTN, kyphosis, a-fib, PVD, spinal stenosis, and stroke as well as RC repair.  These limit the patient's independent carryover with PT  exercises and require max cueing for optimal performance of safety and to strengthen other mms for his future in therapy sessions.    The patient requires the skill of PT to help with ongoing exercise and mobility to prevent rapid and significant decline in functional mobility, strength, and to limit falls with his safety.  Therefore, continued skilled maintenance therapy is recommended for this patient.    OBJECTIVE IMPAIRMENTS: Abnormal gait, cardiopulmonary status limiting activity, decreased activity tolerance, decreased balance, decreased coordination, decreased endurance, decreased mobility, difficulty walking, decreased ROM, decreased strength, improper body mechanics, postural dysfunction, and poor safety.   REHAB POTENTIAL: Fair dependent on carry over and patient compliance  CLINICAL DECISION MAKING: Evolving/moderate complexity  EVALUATION COMPLEXITY: Moderate  GOALS: Goals for skilled maintenance:     Long Term Goals: Target Date 12/13/23     The  patient will preserve functional mobility within the home environment. Baseline: uses a w/c            Goal Status: met 01/15/24   2. The patient will maintain strength for transfers.            Baseline: able to perform with set up and at times requires mod A and at other time may need Max A due to poor safety and impulsiveness            Goal Status: progressing 02/26/24   3. The patient will minimize fall frequency.            Baseline: he has been having about a fall a month            Goal Status: progressing 02/26/24   4. Safe with HEP at home and with equipment he has            Baseline: does HEP             Goal Status: progressing 02/26/24   PLAN:  PT FREQUENCY: 1x/week  PT DURATION: 12 weeks  PLANNED INTERVENTIONS: Therapeutic exercises, Therapeutic activity, Neuromuscular re-education, Balance training, Gait training, Patient/Family education, Self Care, Joint mobilization, Stair training, Cryotherapy, Moist heat, and Manual therapy.  PLAN FOR NEXT SESSION: Have sat up the maintenance program for Brandon. Ehle and progressing with this, biggest issue he is unsafe and cannot do on his own or with his wife.  Trying to maintain his level of independence of living at home with wife and safety, may try VOR  Brandon Mendoza, PT  "

## 2024-03-18 NOTE — Progress Notes (Signed)
 Remote Loop Recorder Transmission

## 2024-03-25 ENCOUNTER — Encounter: Payer: Self-pay | Admitting: Physical Therapy

## 2024-03-25 ENCOUNTER — Ambulatory Visit: Admitting: Physical Therapy

## 2024-03-25 DIAGNOSIS — M5459 Other low back pain: Secondary | ICD-10-CM

## 2024-03-25 DIAGNOSIS — M6249 Contracture of muscle, multiple sites: Secondary | ICD-10-CM

## 2024-03-25 DIAGNOSIS — R296 Repeated falls: Secondary | ICD-10-CM

## 2024-03-25 DIAGNOSIS — M6281 Muscle weakness (generalized): Secondary | ICD-10-CM

## 2024-03-25 DIAGNOSIS — R2689 Other abnormalities of gait and mobility: Secondary | ICD-10-CM

## 2024-03-25 DIAGNOSIS — M25561 Pain in right knee: Secondary | ICD-10-CM

## 2024-03-25 NOTE — Therapy (Signed)
 " OUTPATIENT PHYSICAL THERAPY TREATMENT    Patient Name: Brandon Mendoza. MRN: [id number] DOB:Apr 22, 1940, 83 y.o., male Today's Date: 03/25/2024  END OF SESSION:  PT End of Session - 03/25/24 0911     Visit Number 62    Authorization Type Medicare    PT Start Time 0845    PT Stop Time 0929    PT Time Calculation (min) 44 min    Activity Tolerance Patient tolerated treatment well    Behavior During Therapy [facility] for tasks assessed/performed          Past Medical History:  Diagnosis Date   Acquired clawfoot, right foot 12/16/2021   Anxiety    Atrophy of calf muscles 10/21/2021   Duplex calves was negative for significant blockage EMG was done at [facility] MRI lumbar spine was also done at [facility]   Carotid artery occlusion    CKD (chronic kidney disease)    Stage 3   COPD (chronic obstructive pulmonary disease) (HCC)    Depression    Dysrhythmia    PAF, atrial tachycardia   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   The spinal cord appears normal. 2.   No spinal stenosis. 3.   Mild multilevel degenerative changes as detailed above that do not lead to spinal stenosis or nerve root compression. 4.   T2 hyperintense foci within the pons consistent with chronic microvascular ischemic changes.   Loop - Medtronic Linq 10/22/2020 10/22/2020   Nocturia    Paroxysmal atrial fibrillation (HCC)    Peripheral neuropathy    Peripheral vascular disease    Sleep apnea    on 6 cm, nasal pillow   Spinal stenosis    Stroke (HCC) 05/01/2011   ischemic   Weakness generalized 10/21/2021   Severe, legs and arms   Past Surgical History:  Procedure Laterality Date   ENDARTERECTOMY  12/09/2011   Procedure: ENDARTERECTOMY CAROTID;  Surgeon: Krystal JULIANNA Doing, MD;   Location: [facility] OR;  Service: Vascular;  Laterality: Left;   EYE SURGERY     rt retina detachment   HAMMER TOE SURGERY  03/28/2002   HERNIA REPAIR  03/28/2004   I & D EXTREMITY Right 10/07/2022   Procedure: IRRIGATION AND DEBRIDEMENT EXTREMITY WITH WOUND CLOSURE;  Surgeon: Burnetta Aures, MD;  Location: [facility] ORS;  Service: Orthopedics;  Laterality: Right;   QUADRICEPS TENDON REPAIR Right 07/12/2022   Procedure: REPAIR QUADRICEP TENDON;  Surgeon: Ernie Cough, MD;  Location: [facility] ORS;  Service: Orthopedics;  Laterality: Right;   QUADRICEPS TENDON REPAIR Right 08/30/2022   Procedure: REPAIR QUADRICEP TENDON;  Surgeon: Ernie Cough, MD;  Location: [facility] ORS;  Service: Orthopedics;  Laterality: Right;   SHOULDER ARTHROSCOPY W/ ROTATOR CUFF REPAIR  03/28/2008   TONSILLECTOMY     Patient Active Problem List   Diagnosis Date Noted   History of stroke 02/06/2024   Coronary artery calcification 05/09/2023   COPD (chronic obstructive pulmonary disease) (HCC) 07/08/2022   Macular degeneration 03/01/2022   Lumbar spondylosis 01/03/2022   Acquired clawfoot, left foot 12/16/2021   Acquired clawfoot, right foot 12/16/2021   Spinal stenosis at L4-L5 level 12/10/2021   Ataxia 11/22/2021   Claudication of lower extremity 10/21/2021   Atrophy of  calf muscles 10/21/2021   Weakness generalized 10/21/2021   Kyphosis 10/21/2021   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   Disequilibrium 05/28/2021   S/P lumbar fusion 12/24/2020   Acquired complex renal cyst 11/23/2020   Functional gait abnormality 11/04/2020   Idiopathic neuropathy 11/02/2020   Loop - Medtronic Linq 10/22/2020 10/22/2020   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   Recurrent falls 11/17/2019   Paroxysmal atrial fibrillation (HCC) 10/22/2019   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   Urinary urgency 04/13/2018   Other intervertebral disc degeneration, lumbar region 06/16/2017   Acquired bilateral hammer toes 10/05/2015   Primary  osteoarthritis involving multiple joints 10/05/2015   Generalized anxiety disorder 05/12/2015   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   Essential hypertension 04/10/2014   Occlusion and stenosis of carotid artery without mention of cerebral infarction 12/15/2011   Mixed hyperlipidemia     PCP: Bernardino Cone  REFERRING PROVIDER: Bernardino Cone  REFERRING DIAG: weakness, multiple falls  Rationale for Evaluation and Treatment: Rehabilitation  THERAPY DIAG:  Other abnormalities of gait and mobility  Repeated falls  Other low back pain  Muscle weakness (generalized)  Contracture of muscle, multiple sites  Acute pain of right knee  ONSET DATE: 10/07/22  SUBJECTIVE:                                                                                                                                                                                           SUBJECTIVE STATEMENT: No falls,  had a good week   Patient asking about plan, he reports that he is worried that he will not be able to stay independent and live at home if he stops, he has no equipment and with the right knee's inability to extend and the left knee having issues with buckling due to neuropathy, he is worried about more falls and the strain on his wife   PERTINENT HISTORY:  Cerebral infarct 10/21/21, lumbar fusion, recurrent falls, has significant neuropathy and this is the reason for most of his falls   PAIN:  Are you having pain? Yes: NPRS scale: 0/10 Pain location: right knee Pain description: ache  Aggravating factors: being on my feet  Relieving factors: movement  PRECAUTIONS: Fall  WEIGHT BEARING RESTRICTIONS: No  FALLS:  Has patient fallen in last 6 months? Yes. Number of falls every few days   LIVING ENVIRONMENT: Lives with: lives with their spouse Lives in: House/apartment Stairs: Yes: Internal: 20 steps; on right going up Has following equipment at home: [name] - 4 wheeled, Wheelchair (manual),  shower chair, Tour manager, Grab bars, Ramped entry, and trekking pole  OCCUPATION: Retired  PLOF: Independent, prior  to a year ago he was driving used trekking poles to walk  PATIENT GOALS: live at home  NEXT MD VISIT:   OBJECTIVE:   DIAGNOSTIC FINDINGS:    LUMBAR SPINE FINDINGS: 1. Wide laminectomy and posterior pedicle screw and rod fixation at L4-5 without residual or recurrent stenosis. 2. Progressive adjacent level disease at L3-4 with moderate right and mild left subarticular narrowing and moderate foraminal stenosis bilaterally. Progression is predominantly due to facet disease 3. Progressive moderate facet hypertrophy at L1-2 with a progressive rightward disc protrusion resulting in progressive moderate right foraminal stenosis. Asymmetric endplate marrow edema on the right at L1-2 is consistent with progressive disease as well. 4. Otherwise stable degenerative change without other focal stenosis.   THORACIC SPINE FINDINGS:  On sagittal views the vertebral bodies have normal height and alignment.  Hemangioma within T9 vertebral body and slightly within T10 vertebral body.  Anterior kyphosis.  Mild scoliosis convex right centered at T8 level. The spinal cord is normal in size and appearance. The paraspinal soft tissues are unremarkable.     On axial views there is no spinal stenosis or foraminal narrowing.  Limited views of the aorta, kidneys, liver, lungs and paraspinal muscles are notable for multiple large right renal cysts ranging in size from 1.5 to 7 cm.   COGNITION: Overall cognitive status: Within functional limits for tasks assessed     SENSATION: WFL  MUSCLE LENGTH: Hamstrings: mod tightness on both sides  POSTURE: rounded shoulders and forward head  LOWER EXTREMITY ROM:   the right knee has HS, has no quad tendon on the right, unable to lift the leg, left leg WFL's , left hip and right hip and ankles WFL's   LOWER EXTREMITY MMT: 4-/5 for the LE's  except for the right knee extension UPPER EXTREMITY MMT:  4-/5 for shoulders and elbows   FUNCTIONAL TESTS:  5XSTS: 83 seconds really has to use his hands and is unsteady with standing Cannot stand on his own without holding onto something due to poor balance   Transfers:  needs set up and MinA for safety, is impulsive and unsafe at times. GAIT: He is w/c dependent    TODAY'S TREATMENT:                                                                                                                              DATE:  03/25/24 In pbars 2.5# marches and hip abd with PT blocking knees Seated 2.5 # marches Leg curls 45# 3x15 Leg ext 10# 3x15 Leg press 30# 15# straight arm pulls 15# rows 15# chest press 25# triceps overhead 25# biceps Feet on ball K2C, rotation, bridge, isometric abs   03/18/24 2.5# standing in Pbars PT blocking knees marches and abduction 25# triceps 25# biceps 15# rows 15# chest press 15# straight arm pulls 45# HS curls, 2x15, then right only 25# 2x10 10# leg ext Leg press 40# 15# AR press Feet on ball K2C,  rotation, bridge, isometric abs Black tband clamshells  03/11/24 Standing pbars 2.5# marches and hip abduction with PT blocking knees 25# biceps 15# straight arm pulls 15# rows 15# chest press 25# triceps 45# HS curls 10#  left leg extension 40# leg press  all above 2 x15 reps Feet on ball K2C, rotation, bridges, isometric abs Passive right knee extension Black tband clamshells  03/05/24 15# Row 15# straight arm pulls 2x15 15# chest press 25# triceps 25# biceps In pbars 2.5# marches and abduction in standing with PT blocking knees 45# HS curls 10# leg extension Feet on ball K2C, rotation, bridge, isometric abs Seated elbow touches  02/26/24 In pbars 2.5# marches and abduction PT blocking knees 2.5# seated marches 15# rows 15# straight arm pulls 15# chest press 25# triceps 25# biceps Transfer with Walker from w/c to mat table  CGA 30 seconds Feet on ball K2C, rotation, bridges. Isometric abs Passive right knee stretch into extension Ball b/n knees squeeze Blue tband clamshells Gait with CGA using FWW and close WC follow x 30 feet all weight are on hands  02/20/24 In pbars 2.5# marches and hip abduction with PT blocking knees 45# HS curls, RLE 20lb 2x15 10# leg extension 15# rows 15# extension 15# chest press 10lb chest flys  25# biceps. 25# triceps Passive stretch left HS and piriformis  02/13/24 In pbars 2.5# marches and hip abduction with PT blocking knees 15# rows 15# extension 15# chest press 25# biceps. 25# triceps 45# HS curls, RLE 20lb 2x15 10# leg extension Seated trunk rotations blue ball Passive right knee stretch for extension Passive stretch left HS and piriformis  02/05/24 In pbars 2.5# marches and hip abduction with PT blocking knees 15# rows 15# extension 15# chest press 25# biceps. 25# triceps 45# HS curls 10# leg extension Passive right knee stretch for extension Feet on ball K2C, rotation, bridges, isometric abs Ball b/n knees squeeze Blue tband clamshells Passive stretch left HS and piriformis  01/29/24 In pbars standing marching PT blocking knees And then hip abduction both with 2.5# Leg curls 45# 2x15 Leg extension left leg 10# 2x15 25# biceps curls 2x15 15# rows 15# extension 15# chest press with pulleys 25# triceps Feet on ball K2C, rotation, bridge isometric abs 2x15 Ball b/n knees squeeze Blue tband clamshells Passive stretch right knee for extension   01/22/24 In pbars 2.5# marches and hip abduction 2x15 each with PT blocking knees Sit to stand in pbars working on pushing with left leg Leg press 20#, 40# and 60# x 15 each Leg curls 45# 2x15 and then x10 Leg extension 10# 3x12 All transfers in the gym require CGA to block knees 15# chest press 15# rows 15# straight arm pulls 25# triceps 20# biceps Feet on ball K2C, rotation, bridge,  isometric abs Blue tband clamshells   01/15/24 In Pbars 2.5# marches and abduction  had to stop due to left hip pain 15# rows 2x15 15# straight arm pulls 10# chest press 20# biceps 25# triceps STM to the left hip, buttock, ITB Passive stretch to the left HS and piriformis Feet on ball K2C, rotation, small bridge, isometric abs Passive stretch of the right leg for knee extension Ball b/n knees squeeze Blue tband clamshells  01/08/24 Standing in Pbars PT blocking knees for 3# marches 3# hip abduction And calf raises 20# biceps 25# tricpes 15# row 15# extension 10# chest press 45# HS curls 10# left leg extension PAssive knee stretches Feet on ball K2C, rotation, bridge, isometric abs  Ball b/n knees squeeze Blue tband clamshells  01/01/24 In Pbars marching and hip abduction 2.5# with PT blocking knees Leg curls 45# 2x15 Leg extension 10# 2x15 15# straight arm pulls 15# rows 25# biceps 25# triceps 15# chest press Feet on ball K2C, rotation, bridge, isometric abs Black tband clamshells Black tband right hip supine extension All transfers require CGA  PATIENT EDUCATION:  Education details: POC Person educated: Patient Education method: Explanation Education comprehension: verbalized understanding  HOME EXERCISE PROGRAM:  ASSESSMENT:  CLINICAL IMPRESSION: Patient had an instance when getting off the leg press that he did his thing of turning completely around, I really tried to show him and demonstrate how unsafe it was how he did it and show how it would have been safer, He really struggles with not turning completely around and makes things unsafe, again why he could not do this without help.   He does have a posterior walker at home that he is trying to use some.  I continue to work with Mr Wendorf regarding his current level and his ability to stay home and live independently with his wife.  His diagnosis is complicated in a way that requires continued skilled  interventions to maintain his current level, he is unsafe with transfers and would not be able to do any significant exercise without skilled interventions as he has right knee that has a ruptured quad tendon causing him to be unable to bear weight on this side and has significant neuropathy in both legs , again this causes transfers to be unsafe and unreliable as his knees will give at any time, he has had some falls at home and he feels that due to the PT interventions that he was able to get up on his own, not hurting his wife or having to have an ambulance come to the house and get him up.    WE continue to work on his overall health and hopefully his safety with transfers.    HE is doing the Nustep at home he is still having some hip pain, he does report not taking my advice fully about taking days off.  He still is unsafe at time with the transfers especially when he is fatigued, he is still impulsive and goes too fast at times. The right leg has no quad tendon and the left leg has neuropathy so he is at a very high risk for falls and for regression as he could not exercise much on his own and his wife cannot help him with the standing I feel CGA and blocking the knees are needed due to at any time the knees can buckle, we have tried to teach him to do hip extension to keep the knee locked but he does not do this.  We have set up the maintenance program following medicare guidelines.  He qualifies for this due to the inability to be safe, has had multiple falls and is impulsive, tends to think he can do more than he can.  Patient has not had a fall in the past 5 weeks, he remains very unsafe at times and with any transfer needs min A or close CGA.  Tends to not understand that the right knee has no quad to hold the leg straight.  I  In the long run he is impulsive and unsafe and cannot exercise on his own to maintain his level, he is at a high risk for falls and there is no gym that has the ability to  assist him. Session completed at mat table to eliminate trunk support for improved core stabilization. Core weakness with seated OHP.  Pt did report a fall yesterday stated he was standing pulling his pants up, I advised pt that he shouldn't put weight in his RLE, He ws adamant that he could put a little on it.     Justifying Skilled Maintenance for Rehab Services:   The goal of a skilled maintenance therapy program is to maintain the patient's current functional status or to prevent or slow deterioration. If a patient who is receiving restorative therapy then requires skilled maintenance therapy based on the skills and judgment of the therapist, development of a maintenance program would occur during the last visit for restorative treatment.    Does the patient need treatment that necessitates skilled services that can only be provided by a skilled provider? Can a caregiver provide these services why or why not? 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  He is impulsive and at times very unsafe he could not do this on his own and his wife could not help him., history of COPD, CKD, depression, HTN, kyphosis, a-fib, PVD, spinal stenosis, and stroke as well as RC repair.  These limit the patient's independent carryover with PT  exercises and require max cueing for optimal performance of safety and to strengthen other mms for his future in therapy sessions.    The patient requires the skill of PT to help with ongoing exercise and mobility to prevent rapid and significant decline in functional mobility, strength, and to limit falls with his safety.  Therefore, continued skilled maintenance therapy is recommended for this patient.    OBJECTIVE IMPAIRMENTS: Abnormal gait, cardiopulmonary status limiting activity,  decreased activity tolerance, decreased balance, decreased coordination, decreased endurance, decreased mobility, difficulty walking, decreased ROM, decreased strength, improper body mechanics, postural dysfunction, and poor safety.   REHAB POTENTIAL: Fair dependent on carry over and patient compliance  CLINICAL DECISION MAKING: Evolving/moderate complexity  EVALUATION COMPLEXITY: Moderate  GOALS: Goals for skilled maintenance:     Long Term Goals: Target Date 12/13/23     The patient will preserve functional mobility within the home environment. Baseline: uses a w/c            Goal Status: met 01/15/24   2. The patient will maintain strength for transfers.            Baseline: able to perform with set up and at times requires mod A and at other time may need Max A due to poor safety and impulsiveness            Goal Status: progressing 02/26/24   3. The patient will minimize fall frequency.            Baseline: he has been having about a fall a month            Goal Status: progressing 02/26/24   4. Safe with HEP at home and with equipment he has            Baseline: does HEP             Goal Status: progressing 02/26/24   PLAN:  PT FREQUENCY: 1x/week  PT DURATION: 12 weeks  PLANNED INTERVENTIONS: Therapeutic exercises, Therapeutic  activity, Neuromuscular re-education, Balance training, Gait training, Patient/Family education, Self Care, Joint mobilization, Stair training, Cryotherapy, Moist heat, and Manual therapy.  PLAN FOR NEXT SESSION: Have sat up the maintenance program for Mr. Scheer and progressing with this, biggest issue he is unsafe and cannot do on his own or with his wife.  Trying to maintain his level of independence of living at home with wife and safety, may try VOR  OBADIAH OZELL ORN, PT  "

## 2024-04-01 ENCOUNTER — Ambulatory Visit: Attending: Family Medicine | Admitting: Physical Therapy

## 2024-04-01 ENCOUNTER — Encounter: Payer: Self-pay | Admitting: Physical Therapy

## 2024-04-01 DIAGNOSIS — M6249 Contracture of muscle, multiple sites: Secondary | ICD-10-CM | POA: Insufficient documentation

## 2024-04-01 DIAGNOSIS — M5459 Other low back pain: Secondary | ICD-10-CM | POA: Insufficient documentation

## 2024-04-01 DIAGNOSIS — R2689 Other abnormalities of gait and mobility: Secondary | ICD-10-CM | POA: Insufficient documentation

## 2024-04-01 DIAGNOSIS — M25561 Pain in right knee: Secondary | ICD-10-CM | POA: Diagnosis present

## 2024-04-01 DIAGNOSIS — R296 Repeated falls: Secondary | ICD-10-CM | POA: Diagnosis present

## 2024-04-01 DIAGNOSIS — M6281 Muscle weakness (generalized): Secondary | ICD-10-CM | POA: Diagnosis present

## 2024-04-01 NOTE — Therapy (Signed)
 " OUTPATIENT PHYSICAL THERAPY TREATMENT    Patient Name: Brandon Mendoza. MRN: [id number] DOB:1940/10/12, 84 y.o., male Today's Date: 04/01/2024  END OF SESSION:  PT End of Session - 04/01/24 0930     Visit Number 63    Date for Recertification  05/02/24    Authorization Type Medicare    PT Start Time 0930    PT Stop Time 1015    PT Time Calculation (min) 45 min    Activity Tolerance Patient tolerated treatment well    Behavior During Therapy WFL for tasks assessed/performed          Past Medical History:  Diagnosis Date   Acquired clawfoot, right foot 12/16/2021   Anxiety    Atrophy of calf muscles 10/21/2021   Duplex calves was negative for significant blockage EMG was done at [facility] MRI lumbar spine was also done at [facility]   Carotid artery occlusion    CKD (chronic kidney disease)    Stage 3   COPD (chronic obstructive pulmonary disease) (HCC)    Depression    Dysrhythmia    PAF, atrial tachycardia   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   The spinal cord appears normal. 2.   No spinal stenosis. 3.   Mild multilevel degenerative changes as detailed above that do not lead to spinal stenosis or nerve root compression. 4.   T2 hyperintense foci within the pons consistent with chronic microvascular ischemic changes.   Loop - Medtronic Linq 10/22/2020 10/22/2020   Nocturia    Paroxysmal atrial fibrillation (HCC)    Peripheral neuropathy    Peripheral vascular disease    Sleep apnea    on 6 cm, nasal pillow   Spinal stenosis    Stroke (HCC) 05/01/2011   ischemic   Weakness generalized 10/21/2021   Severe, legs and arms   Past Surgical History:  Procedure Laterality Date   ENDARTERECTOMY  12/09/2011   Procedure: ENDARTERECTOMY  CAROTID;  Surgeon: Krystal JULIANNA Doing, MD;  Location: [facility] OR;  Service: Vascular;  Laterality: Left;   EYE SURGERY     rt retina detachment   HAMMER TOE SURGERY  03/28/2002   HERNIA REPAIR  03/28/2004   I & D EXTREMITY Right 10/07/2022   Procedure: IRRIGATION AND DEBRIDEMENT EXTREMITY WITH WOUND CLOSURE;  Surgeon: Burnetta Aures, MD;  Location: [facility] ORS;  Service: Orthopedics;  Laterality: Right;   QUADRICEPS TENDON REPAIR Right 07/12/2022   Procedure: REPAIR QUADRICEP TENDON;  Surgeon: Ernie Cough, MD;  Location: [facility];  Service: Orthopedics;  Laterality: Right;   QUADRICEPS TENDON REPAIR Right 08/30/2022   Procedure: REPAIR QUADRICEP TENDON;  Surgeon: Ernie Cough, MD;  Location: [facility];  Service: Orthopedics;  Laterality: Right;   SHOULDER ARTHROSCOPY W/ ROTATOR CUFF REPAIR  03/28/2008   TONSILLECTOMY     Patient Active Problem List   Diagnosis Date Noted   History of stroke 02/06/2024   Coronary artery calcification 05/09/2023   COPD (chronic obstructive pulmonary disease) (HCC) 07/08/2022   Macular degeneration 03/01/2022   Lumbar spondylosis 01/03/2022   Acquired clawfoot, left foot 12/16/2021   Acquired clawfoot, right foot 12/16/2021   Spinal stenosis at L4-L5 level 12/10/2021   Ataxia 11/22/2021   Claudication  of lower extremity 10/21/2021   Atrophy of calf muscles 10/21/2021   Weakness generalized 10/21/2021   Kyphosis 10/21/2021   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   Disequilibrium 05/28/2021   S/P lumbar fusion 12/24/2020   Acquired complex renal cyst 11/23/2020   Functional gait abnormality 11/04/2020   Idiopathic neuropathy 11/02/2020   Loop - Medtronic Linq 10/22/2020 10/22/2020   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   Recurrent falls 11/17/2019   Paroxysmal atrial fibrillation (HCC) 10/22/2019   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   Urinary urgency 04/13/2018   Other intervertebral disc degeneration, lumbar region 06/16/2017   Acquired bilateral hammer  toes 10/05/2015   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   Generalized anxiety disorder 05/12/2015   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   Essential hypertension 04/10/2014   Occlusion and stenosis of carotid artery without mention of cerebral infarction 12/15/2011   Mixed hyperlipidemia     PCP: Bernardino Cone  REFERRING PROVIDER: Bernardino Cone  REFERRING DIAG: weakness, multiple falls  Rationale for Evaluation and Treatment: Rehabilitation  THERAPY DIAG:  Other abnormalities of gait and mobility  Repeated falls  Other low back pain  Muscle weakness (generalized)  Contracture of muscle, multiple sites  Acute pain of right knee  ONSET DATE: 10/07/22  SUBJECTIVE:                                                                                                                                                                                           SUBJECTIVE STATEMENT: No falls,, still doing the Nustep at home   Patient asking about plan, he reports that he is worried that he will not be able to stay independent and live at home if he stops, he has no equipment and with the right knee's inability to extend and the left knee having issues with buckling due to neuropathy, he is worried about more falls and the strain on his wife   PERTINENT HISTORY:  Cerebral infarct 10/21/21, lumbar fusion, recurrent falls, has significant neuropathy and this is the reason for most of his falls   PAIN:  Are you having pain? Yes: NPRS scale: 0/10 Pain location: right knee Pain description: ache  Aggravating factors: being on my feet  Relieving factors: movement  PRECAUTIONS: Fall  WEIGHT BEARING RESTRICTIONS: No  FALLS:  Has patient fallen in last 6 months? Yes. Number of falls every few days   LIVING ENVIRONMENT: Lives with: lives with their spouse Lives in: House/apartment Stairs: Yes: Internal: 20 steps; on right going up Has following equipment at home:  [name] - 4 wheeled, Wheelchair (manual), shower chair, Shower bench, Grab bars, Ramped entry, and  trekking pole  OCCUPATION: Retired  PLOF: Independent, prior to a year ago he was driving used trekking poles to walk  PATIENT GOALS: live at home  NEXT MD VISIT:   OBJECTIVE:   DIAGNOSTIC FINDINGS:    LUMBAR SPINE FINDINGS: 1. Wide laminectomy and posterior pedicle screw and rod fixation at L4-5 without residual or recurrent stenosis. 2. Progressive adjacent level disease at L3-4 with moderate right and mild left subarticular narrowing and moderate foraminal stenosis bilaterally. Progression is predominantly due to facet disease 3. Progressive moderate facet hypertrophy at L1-2 with a progressive rightward disc protrusion resulting in progressive moderate right foraminal stenosis. Asymmetric endplate marrow edema on the right at L1-2 is consistent with progressive disease as well. 4. Otherwise stable degenerative change without other focal stenosis.   THORACIC SPINE FINDINGS:  On sagittal views the vertebral bodies have normal height and alignment.  Hemangioma within T9 vertebral body and slightly within T10 vertebral body.  Anterior kyphosis.  Mild scoliosis convex right centered at T8 level. The spinal cord is normal in size and appearance. The paraspinal soft tissues are unremarkable.     On axial views there is no spinal stenosis or foraminal narrowing.  Limited views of the aorta, kidneys, liver, lungs and paraspinal muscles are notable for multiple large right renal cysts ranging in size from 1.5 to 7 cm.   COGNITION: Overall cognitive status: Within functional limits for tasks assessed     SENSATION: WFL  MUSCLE LENGTH: Hamstrings: mod tightness on both sides  POSTURE: rounded shoulders and forward head  LOWER EXTREMITY ROM:   the right knee has HS, has no quad tendon on the right, unable to lift the leg, left leg WFL's , left hip and right hip and ankles  WFL's   LOWER EXTREMITY MMT: 4-/5 for the LE's except for the right knee extension UPPER EXTREMITY MMT:  4-/5 for shoulders and elbows   FUNCTIONAL TESTS:  5XSTS: 83 seconds really has to use his hands and is unsteady with standing Cannot stand on his own without holding onto something due to poor balance   Transfers:  needs set up and MinA for safety, is impulsive and unsafe at times. GAIT: He is w/c dependent    TODAY'S TREATMENT:                                                                                                                              DATE:  04/01/24 2.5# standing in pbars hip flexion, abduction with PT blocking knee Seated 2.5# hip flexion 15# rows 15# straight arm ext 15# chest press 25# triceps 25# biceps 45# leg curls 10# leg ext Feet on ball K2C, rotation, bridge, isometric abs Black tband supine hip extension to focus on the right leg as it was not working doing bridges on the ball Supine hip IR as it ER's significantly   03/25/24 In pbars 2.5# marches and hip abd with PT blocking knees Seated 2.5 #  marches Leg curls 45# 3x15 Leg ext 10# 3x15 Leg press 30# 15# straight arm pulls 15# rows 15# chest press 25# triceps overhead 25# biceps Feet on ball K2C, rotation, bridge, isometric abs   03/18/24 2.5# standing in Pbars PT blocking knees marches and abduction 25# triceps 25# biceps 15# rows 15# chest press 15# straight arm pulls 45# HS curls, 2x15, then right only 25# 2x10 10# leg ext Leg press 40# 15# AR press Feet on ball K2C, rotation, bridge, isometric abs Black tband clamshells  03/11/24 Standing pbars 2.5# marches and hip abduction with PT blocking knees 25# biceps 15# straight arm pulls 15# rows 15# chest press 25# triceps 45# HS curls 10#  left leg extension 40# leg press  all above 2 x15 reps Feet on ball K2C, rotation, bridges, isometric abs Passive right knee extension Black tband clamshells  03/05/24 15#  Row 15# straight arm pulls 2x15 15# chest press 25# triceps 25# biceps In pbars 2.5# marches and abduction in standing with PT blocking knees 45# HS curls 10# leg extension Feet on ball K2C, rotation, bridge, isometric abs Seated elbow touches  02/26/24 In pbars 2.5# marches and abduction PT blocking knees 2.5# seated marches 15# rows 15# straight arm pulls 15# chest press 25# triceps 25# biceps Transfer with Walker from w/c to mat table CGA 30 seconds Feet on ball K2C, rotation, bridges. Isometric abs Passive right knee stretch into extension Ball b/n knees squeeze Blue tband clamshells Gait with CGA using FWW and close WC follow x 30 feet all weight are on hands  02/20/24 In pbars 2.5# marches and hip abduction with PT blocking knees 45# HS curls, RLE 20lb 2x15 10# leg extension 15# rows 15# extension 15# chest press 10lb chest flys  25# biceps. 25# triceps Passive stretch left HS and piriformis  02/13/24 In pbars 2.5# marches and hip abduction with PT blocking knees 15# rows 15# extension 15# chest press 25# biceps. 25# triceps 45# HS curls, RLE 20lb 2x15 10# leg extension Seated trunk rotations blue ball Passive right knee stretch for extension Passive stretch left HS and piriformis   PATIENT EDUCATION:  Education details: POC Person educated: Patient Education method: Explanation Education comprehension: verbalized understanding  HOME EXERCISE PROGRAM:  ASSESSMENT:  CLINICAL IMPRESSION: Patient continues to live with wife at home, he is doing nustep at home, but has no ability to do other exercises that are strong enough to benefit him, other issues include unsafe transfers, legs buckling and giving, neuropathy and a torn right quad tendon.  He can transfer with his wife but again is very unsafe and would not be able to go to gym due to safety issues, in clinic he needs setup and CGA, sometimes needs PT to block knee so it does not give out   He  does have a posterior walker at home that he is trying to use some.  I continue to work with Brandon Mendoza regarding his current level and his ability to stay home and live independently with his wife.  His diagnosis is complicated in a way that requires continued skilled interventions to maintain his current level, he is unsafe with transfers and would not be able to do any significant exercise without skilled interventions as he has right knee that has a ruptured quad tendon causing him to be unable to bear weight on this side and has significant neuropathy in both legs , again this causes transfers to be unsafe and unreliable as his knees will give  at any time, he has had some falls at home and he feels that due to the PT interventions that he was able to get up on his own, not hurting his wife or having to have an ambulance come to the house and get him up.    WE continue to work on his overall health and hopefully his safety with transfers.    HE is doing the Nustep at home he is still having some hip pain, he does report not taking my advice fully about taking days off.  He still is unsafe at time with the transfers especially when he is fatigued, he is still impulsive and goes too fast at times. The right leg has no quad tendon and the left leg has neuropathy so he is at a very high risk for falls and for regression as he could not exercise much on his own and his wife cannot help him with the standing I feel CGA and blocking the knees are needed due to at any time the knees can buckle, we have tried to teach him to do hip extension to keep the knee locked but he does not do this.  We have set up the maintenance program following medicare guidelines.  He qualifies for this due to the inability to be safe, has had multiple falls and is impulsive, tends to think he can do more than he can.  Patient has not had a fall in the past 5 weeks, he remains very unsafe at times and with any transfer needs min A or close  CGA.  Tends to not understand that the right knee has no quad to hold the leg straight.  I  In the long run he is impulsive and unsafe and cannot exercise on his own to maintain his level, he is at a high risk for falls and there is no gym that has the ability to assist him. Session completed at mat table to eliminate trunk support for improved core stabilization. Core weakness with seated OHP.  Pt did report a fall yesterday stated he was standing pulling his pants up, I advised pt that he shouldn't put weight in his RLE, He ws adamant that he could put a little on it.     Justifying Skilled Maintenance for Rehab Services:   The goal of a skilled maintenance therapy program is to maintain the patient's current functional status or to prevent or slow deterioration. If a patient who is receiving restorative therapy then requires skilled maintenance therapy based on the skills and judgment of the therapist, development of a maintenance program would occur during the last visit for restorative treatment.    Does the patient need treatment that necessitates skilled services that can only be provided by a skilled provider? Can a caregiver provide these services why or why not? 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  He is impulsive and at times very unsafe he could not do this on his own and his wife could not help him., history of COPD, CKD, depression, HTN, kyphosis, a-fib, PVD, spinal stenosis, and stroke as well as RC repair.  These limit the patient's independent carryover with PT  exercises and require max cueing for optimal performance of safety and to strengthen other mms for  his future in therapy sessions.    The patient requires the skill of PT to help with ongoing exercise and mobility to prevent rapid and  significant decline in functional mobility, strength, and to limit falls with his safety.  Therefore, continued skilled maintenance therapy is recommended for this patient.    OBJECTIVE IMPAIRMENTS: Abnormal gait, cardiopulmonary status limiting activity, decreased activity tolerance, decreased balance, decreased coordination, decreased endurance, decreased mobility, difficulty walking, decreased ROM, decreased strength, improper body mechanics, postural dysfunction, and poor safety.   REHAB POTENTIAL: Fair dependent on carry over and patient compliance  CLINICAL DECISION MAKING: Evolving/moderate complexity  EVALUATION COMPLEXITY: Moderate  GOALS: Goals for skilled maintenance:     Long Term Goals: Target Date 12/13/23     The patient will preserve functional mobility within the home environment. Baseline: uses a w/c            Goal Status: met 01/15/24   2. The patient will maintain strength for transfers.            Baseline: able to perform with set up and at times requires mod A and at other time may need Max A due to poor safety and impulsiveness            Goal Status: progressing 02/26/24   3. The patient will minimize fall frequency.            Baseline: he has been having about a fall a month            Goal Status: progressing 02/26/24   4. Safe with HEP at home and with equipment he has            Baseline: does HEP             Goal Status: progressing 02/26/24   PLAN:  PT FREQUENCY: 1x/week  PT DURATION: 12 weeks  PLANNED INTERVENTIONS: Therapeutic exercises, Therapeutic activity, Neuromuscular re-education, Balance training, Gait training, Patient/Family education, Self Care, Joint mobilization, Stair training, Cryotherapy, Moist heat, and Manual therapy.  PLAN FOR NEXT SESSION: Have sat up the maintenance program for Brandon Mendoza and progressing with this, biggest issue he is unsafe and cannot do on his own or with his wife.  Trying to maintain his level of  independence of living at home with wife and safety, may try VOR  Brandon Mendoza, PT  "

## 2024-04-02 ENCOUNTER — Ambulatory Visit: Attending: Student | Admitting: Student

## 2024-04-02 ENCOUNTER — Encounter: Payer: Self-pay | Admitting: Student

## 2024-04-02 VITALS — BP 126/70 | HR 59 | Ht 68.0 in | Wt 184.0 lb

## 2024-04-02 DIAGNOSIS — Z9889 Other specified postprocedural states: Secondary | ICD-10-CM | POA: Diagnosis not present

## 2024-04-02 DIAGNOSIS — D6869 Other thrombophilia: Secondary | ICD-10-CM | POA: Insufficient documentation

## 2024-04-02 DIAGNOSIS — I480 Paroxysmal atrial fibrillation: Secondary | ICD-10-CM | POA: Insufficient documentation

## 2024-04-02 NOTE — Progress Notes (Signed)
" °  Electrophysiology Office Note:   Date:  04/02/2024  ID:  Brandon Mendoza., DOB 1940-09-18, MRN [id number]  Primary Cardiologist: Gordy Bergamo, MD Electrophysiologist: Fonda Kitty, MD   Electrophysiologist:  Fonda Kitty, MD      History of Present Illness:   Mavric Cortright. is a 84 y.o. male with h/o hypertension, hyperlipidemia, remote stroke with left carotid endarterectomy (Dr. Oris), OSA on CPAP, remote tobacco use disorder, gait instability and frequent falls related to spinal stenosis and peripheral neuropathy, paroxysmal atrial fibrillation (not on anticoagulation due to spontaneous muscle hematoma and frequent falls) seen today for routine electrophysiology followup.   Seen by Dr. Kitty 01/18/2024. Restarted on flecainide   Since last being seen in our clinic the patient reports doing well overall. No breakthrough arrhythmia of which he is aware. Otherwise, he denies chest pain, palpitations, dyspnea, PND, orthopnea, nausea, vomiting, dizziness, syncope, edema, weight gain, or early satiety.   Review of systems complete and found to be negative unless listed in HPI.   EP Information / Studies Reviewed:    EKG is ordered today. Personal review as below.  EKG Interpretation Date/Time:  Tuesday April 02 2024 09:28:33 EST Ventricular Rate:  59 PR Interval:  188 QRS Duration:  78 QT Interval:  416 QTC Calculation: 411 R Axis:   34  Text Interpretation: Sinus bradycardia Confirmed by Lesia Heck ([id number]) on 04/02/2024 9:30:21 AM    Arrhythmia/Device History Will discuss LAA appendage closure   Physical Exam:   VS:  BP 126/70   Pulse (!) 59   Ht 5' 8 (1.727 m)   Wt 184 lb (83.5 kg) Comment: per pt report as unable to stand  SpO2 98%   BMI 27.98 kg/m    Wt Readings from Last 3 Encounters:  04/02/24 184 lb (83.5 kg)  03/13/24 184 lb (83.5 kg)  02/06/24 188 lb 14.4 oz (85.7 kg)     GEN: No acute distress NECK: No JVD; No carotid bruits CARDIAC: Regular rate and  rhythm, no murmurs, rubs, gallops RESPIRATORY:  Clear to auscultation without rales, wheezing or rhonchi  ABDOMEN: Soft, non-tender, non-distended EXTREMITIES:  No edema; No deformity   ASSESSMENT AND PLAN:    Paroxysmal AF s/p Medtronic loop recorder Secondary hypercoagulable state 0% burden by device EKG today shows sinus brady with stable intervals Continue flecainide  50 mg BID Not on AV nodal agent. OK given advanced age.  Not on OAC with h/o spontaneous hematoma and frequent falls.  HTN Stable on current regimen   Follow up with Dr. Kitty in 6 months  Signed, Ozell Prentice Lesia, PA-C  "

## 2024-04-02 NOTE — Patient Instructions (Signed)
 Medication Instructions:  No medication changes today. *If you need a refill on your cardiac medications before your next appointment, please call your pharmacy*  Lab Work: No labwork ordered today. If you have labs (blood work) drawn today and your tests are completely normal, you will receive your results only by: MyChart Message (if you have MyChart) OR A paper copy in the mail If you have any lab test that is abnormal or we need to change your treatment, we will call you to review the results.  Testing/Procedures: No testing ordered today  Follow-Up: At [facility], you and your health needs are our priority.  As part of our continuing mission to provide you with exceptional heart care, our providers are all part of one team.  This team includes your primary Cardiologist (physician) and Advanced Practice Providers or APPs (Physician Assistants and Nurse Practitioners) who all work together to provide you with the care you need, when you need it.  Your next appointment:   6 month(s)  Provider:   You may see Ardeen Kohler, MD or one of the following Advanced Practice Providers on your designated Care Team:   Mertha Abrahams, Kennard Pea "Jonelle Neri" [place], PA-C Suzann Riddle, NP Creighton Doffing, NP    We recommend signing up for the patient portal called "MyChart".  Sign up information is provided on this After Visit Summary.  MyChart is used to connect with patients for Virtual Visits (Telemedicine).  Patients are able to view lab/test results, encounter notes, upcoming appointments, etc.  Non-urgent messages can be sent to your provider as well.   To learn more about what you can do with MyChart, go to [email or web address].

## 2024-04-09 ENCOUNTER — Encounter: Payer: Self-pay | Admitting: Physical Therapy

## 2024-04-09 ENCOUNTER — Ambulatory Visit: Admitting: Physical Therapy

## 2024-04-09 ENCOUNTER — Ambulatory Visit
Admission: RE | Admit: 2024-04-09 | Discharge: 2024-04-09 | Disposition: A | Discharge status: Home / Self Care | Source: Ambulatory Visit | Attending: Emergency Medicine | Admitting: Emergency Medicine

## 2024-04-09 VITALS — BP 127/75 | HR 68 | Temp 97.6°F | Resp 16

## 2024-04-09 DIAGNOSIS — R2689 Other abnormalities of gait and mobility: Secondary | ICD-10-CM | POA: Diagnosis not present

## 2024-04-09 DIAGNOSIS — M25561 Pain in right knee: Secondary | ICD-10-CM

## 2024-04-09 DIAGNOSIS — H6123 Impacted cerumen, bilateral: Secondary | ICD-10-CM | POA: Diagnosis not present

## 2024-04-09 DIAGNOSIS — H60391 Other infective otitis externa, right ear: Secondary | ICD-10-CM

## 2024-04-09 DIAGNOSIS — M6281 Muscle weakness (generalized): Secondary | ICD-10-CM

## 2024-04-09 DIAGNOSIS — M6249 Contracture of muscle, multiple sites: Secondary | ICD-10-CM

## 2024-04-09 DIAGNOSIS — M5459 Other low back pain: Secondary | ICD-10-CM

## 2024-04-09 DIAGNOSIS — R296 Repeated falls: Secondary | ICD-10-CM

## 2024-04-09 MED ORDER — NEOMYCIN-POLYMYXIN-HC 3.5-10000-1 OT SUSP: 4.0000 [drp] | Freq: Three times a day (TID) | OTIC | 0 refills | Status: AC

## 2024-04-09 NOTE — Therapy (Signed)
 " OUTPATIENT PHYSICAL THERAPY TREATMENT    Patient Name: Brandon Mendoza. MRN: [id number] DOB:Dec 14, 1940, 84 y.o., male Today's Date: 04/09/2024  END OF SESSION:  PT End of Session - 04/09/24 0847     Visit Number 64    Date for Recertification  05/02/24    Authorization Type Medicare    PT Start Time 0841    PT Stop Time 0926    PT Time Calculation (min) 45 min    Activity Tolerance Patient tolerated treatment well    Behavior During Therapy [facility] for tasks assessed/performed          Past Medical History:  Diagnosis Date   Acquired clawfoot, right foot 12/16/2021   Anxiety    Atrophy of calf muscles 10/21/2021   Duplex calves was negative for significant blockage EMG was done at [facility] MRI lumbar spine was also done at [facility]   Carotid artery occlusion    CKD (chronic kidney disease)    Stage 3   COPD (chronic obstructive pulmonary disease) (HCC)    Depression    Dysrhythmia    PAF, atrial tachycardia   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   The spinal cord appears normal. 2.   No spinal stenosis. 3.   Mild multilevel degenerative changes as detailed above that do not lead to spinal stenosis or nerve root compression. 4.   T2 hyperintense foci within the pons consistent with chronic microvascular ischemic changes.   Loop - Medtronic Linq 10/22/2020 10/22/2020   Nocturia    Paroxysmal atrial fibrillation (HCC)    Peripheral neuropathy    Peripheral vascular disease    Sleep apnea    on 6 cm, nasal pillow   Spinal stenosis    Stroke (HCC) 05/01/2011   ischemic   Weakness generalized 10/21/2021   Severe, legs and arms   Past Surgical History:  Procedure Laterality Date   ENDARTERECTOMY  12/09/2011   Procedure: ENDARTERECTOMY  CAROTID;  Surgeon: Krystal JULIANNA Doing, MD;  Location: [facility] OR;  Service: Vascular;  Laterality: Left;   EYE SURGERY     rt retina detachment   HAMMER TOE SURGERY  03/28/2002   HERNIA REPAIR  03/28/2004   I & D EXTREMITY Right 10/07/2022   Procedure: IRRIGATION AND DEBRIDEMENT EXTREMITY WITH WOUND CLOSURE;  Surgeon: Burnetta Aures, MD;  Location: [facility] ORS;  Service: Orthopedics;  Laterality: Right;   QUADRICEPS TENDON REPAIR Right 07/12/2022   Procedure: REPAIR QUADRICEP TENDON;  Surgeon: Ernie Cough, MD;  Location: [facility] OR[facility];  Service: Orthopedics;  Laterality: Right;   QUADRICEPS TENDON REPAIR Right 08/30/2022   Procedure: REPAIR QUADRICEP TENDON;  Surgeon: Ernie Cough, MD;  Location: [facility];  Service: Orthopedics;  Laterality: Right;   SHOULDER ARTHROSCOPY W/ ROTATOR CUFF REPAIR  03/28/2008   TONSILLECTOMY     Patient Active Problem List   Diagnosis Date Noted   History of stroke 02/06/2024   Coronary artery calcification 05/09/2023   COPD (chronic obstructive pulmonary disease) (HCC) 07/08/2022   Macular degeneration 03/01/2022   Lumbar spondylosis 01/03/2022   Acquired clawfoot, left foot 12/16/2021   Acquired clawfoot, right foot 12/16/2021   Spinal stenosis at L4-L5 level 12/10/2021   Ataxia 11/22/2021   Claudication  of lower extremity 10/21/2021   Atrophy of calf muscles 10/21/2021   Weakness generalized 10/21/2021   Kyphosis 10/21/2021   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   Disequilibrium 05/28/2021   S/P lumbar fusion 12/24/2020   Acquired complex renal cyst 11/23/2020   Functional gait abnormality 11/04/2020   Idiopathic neuropathy 11/02/2020   Loop - Medtronic Linq 10/22/2020 10/22/2020   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   Recurrent falls 11/17/2019   Paroxysmal atrial fibrillation (HCC) 10/22/2019   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   Urinary urgency 04/13/2018   Other intervertebral disc degeneration, lumbar region 06/16/2017   Acquired bilateral hammer  toes 10/05/2015   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   Generalized anxiety disorder 05/12/2015   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   Essential hypertension 04/10/2014   Occlusion and stenosis of carotid artery without mention of cerebral infarction 12/15/2011   Mixed hyperlipidemia     PCP: Bernardino Cone  REFERRING PROVIDER: Bernardino Cone  REFERRING DIAG: weakness, multiple falls  Rationale for Evaluation and Treatment: Rehabilitation  THERAPY DIAG:  Other abnormalities of gait and mobility  Repeated falls  Other low back pain  Acute pain of right knee  Contracture of muscle, multiple sites  Muscle weakness (generalized)  ONSET DATE: 10/07/22  SUBJECTIVE:                                                                                                                                                                                           SUBJECTIVE STATEMENT: No falls,, still doing the Nustep at home, reports no new issues   Patient asking about plan, he reports that he is worried that he will not be able to stay independent and live at home if he stops, he has no equipment and with the right knee's inability to extend and the left knee having issues with buckling due to neuropathy, he is worried about more falls and the strain on his wife   PERTINENT HISTORY:  Cerebral infarct 10/21/21, lumbar fusion, recurrent falls, has significant neuropathy and this is the reason for most of his falls   PAIN:  Are you having pain? Yes: NPRS scale: 0/10 Pain location: right knee Pain description: ache  Aggravating factors: being on my feet  Relieving factors: movement  PRECAUTIONS: Fall  WEIGHT BEARING RESTRICTIONS: No  FALLS:  Has patient fallen in last 6 months? Yes. Number of falls every few days   LIVING ENVIRONMENT: Lives with: lives with their spouse Lives in: House/apartment Stairs: Yes: Internal: 20 steps; on right going up Has  following equipment at home: [name] - 4 wheeled, Wheelchair (manual), shower chair, Tour manager, [employer]  bars, Ramped entry, and trekking pole  OCCUPATION: Retired  PLOF: Independent, prior to a year ago he was driving used trekking poles to walk  PATIENT GOALS: live at home  NEXT MD VISIT:   OBJECTIVE:   DIAGNOSTIC FINDINGS:    LUMBAR SPINE FINDINGS: 1. Wide laminectomy and posterior pedicle screw and rod fixation at L4-5 without residual or recurrent stenosis. 2. Progressive adjacent level disease at L3-4 with moderate right and mild left subarticular narrowing and moderate foraminal stenosis bilaterally. Progression is predominantly due to facet disease 3. Progressive moderate facet hypertrophy at L1-2 with a progressive rightward disc protrusion resulting in progressive moderate right foraminal stenosis. Asymmetric endplate marrow edema on the right at L1-2 is consistent with progressive disease as well. 4. Otherwise stable degenerative change without other focal stenosis.   THORACIC SPINE FINDINGS:  On sagittal views the vertebral bodies have normal height and alignment.  Hemangioma within T9 vertebral body and slightly within T10 vertebral body.  Anterior kyphosis.  Mild scoliosis convex right centered at T8 level. The spinal cord is normal in size and appearance. The paraspinal soft tissues are unremarkable.     On axial views there is no spinal stenosis or foraminal narrowing.  Limited views of the aorta, kidneys, liver, lungs and paraspinal muscles are notable for multiple large right renal cysts ranging in size from 1.5 to 7 cm.   COGNITION: Overall cognitive status: Within functional limits for tasks assessed     SENSATION: WFL  MUSCLE LENGTH: Hamstrings: mod tightness on both sides  POSTURE: rounded shoulders and forward head  LOWER EXTREMITY ROM:   the right knee has HS, has no quad tendon on the right, unable to lift the leg, left leg WFL's , left hip and  right hip and ankles WFL's   LOWER EXTREMITY MMT: 4-/5 for the LE's except for the right knee extension UPPER EXTREMITY MMT:  4-/5 for shoulders and elbows   FUNCTIONAL TESTS:  5XSTS: 83 seconds really has to use his hands and is unsteady with standing Cannot stand on his own without holding onto something due to poor balance   Transfers:  needs set up and MinA for safety, is impulsive and unsafe at times. GAIT: He is w/c dependent    TODAY'S TREATMENT:                                                                                                                              DATE:  04/09/24 Leg curls 45# 3x15 Leg ext 10# 3x15 2.5# seated marches 2.5# marches and hip abduction in the pbars with PT blocking knees 15# rows 15# straight arm pulls 15# chest press 25# biceps 25# triceps Feet on ball K2C, rotation, bridge, isometric abs Passive right knee extension, left hip piriformis Ball b/n knees squeeze Blue tband clamshells VOR eye ROM and saccades  04/01/24 2.5# standing in pbars hip flexion, abduction with PT blocking knee Seated 2.5# hip flexion 15# rows 15#  straight arm ext 15# chest press 25# triceps 25# biceps 45# leg curls 10# leg ext Feet on ball K2C, rotation, bridge, isometric abs Black tband supine hip extension to focus on the right leg as it was not working doing bridges on the ball Supine hip IR as it ER's significantly   03/25/24 In pbars 2.5# marches and hip abd with PT blocking knees Seated 2.5 # marches Leg curls 45# 3x15 Leg ext 10# 3x15 Leg press 30# 15# straight arm pulls 15# rows 15# chest press 25# triceps overhead 25# biceps Feet on ball K2C, rotation, bridge, isometric abs   03/18/24 2.5# standing in Pbars PT blocking knees marches and abduction 25# triceps 25# biceps 15# rows 15# chest press 15# straight arm pulls 45# HS curls, 2x15, then right only 25# 2x10 10# leg ext Leg press 40# 15# AR press Feet on ball K2C,  rotation, bridge, isometric abs Black tband clamshells  03/11/24 Standing pbars 2.5# marches and hip abduction with PT blocking knees 25# biceps 15# straight arm pulls 15# rows 15# chest press 25# triceps 45# HS curls 10#  left leg extension 40# leg press  all above 2 x15 reps Feet on ball K2C, rotation, bridges, isometric abs Passive right knee extension Black tband clamshells  03/05/24 15# Row 15# straight arm pulls 2x15 15# chest press 25# triceps 25# biceps In pbars 2.5# marches and abduction in standing with PT blocking knees 45# HS curls 10# leg extension Feet on ball K2C, rotation, bridge, isometric abs Seated elbow touches  02/26/24 In pbars 2.5# marches and abduction PT blocking knees 2.5# seated marches 15# rows 15# straight arm pulls 15# chest press 25# triceps 25# biceps Transfer with Walker from w/c to mat table CGA 30 seconds Feet on ball K2C, rotation, bridges. Isometric abs Passive right knee stretch into extension Ball b/n knees squeeze Blue tband clamshells Gait with CGA using FWW and close WC follow x 30 feet all weight are on hands  02/20/24 In pbars 2.5# marches and hip abduction with PT blocking knees 45# HS curls, RLE 20lb 2x15 10# leg extension 15# rows 15# extension 15# chest press 10lb chest flys  25# biceps. 25# triceps Passive stretch left HS and piriformis  PATIENT EDUCATION:  Education details: POC Person educated: Patient Education method: Explanation Education comprehension: verbalized understanding  HOME EXERCISE PROGRAM:  ASSESSMENT:  CLINICAL IMPRESSION: Patient continues to live with wife at home, he is doing nustep at home, but has no ability to do other exercises that are strong enough to benefit him, other issues include unsafe transfers, legs buckling and giving, neuropathy and a torn right quad tendon.  He can transfer with his wife but again is very unsafe and would not be able to go to gym due to safety  issues, in clinic he needs setup and CGA, sometimes needs PT to block knee so it does not give out   He does have a posterior walker at home that he is trying to use some.  I continue to work with Mr Meras regarding his current level and his ability to stay home and live independently with his wife.  His diagnosis is complicated in a way that requires continued skilled interventions to maintain his current level, he is unsafe with transfers and would not be able to do any significant exercise without skilled interventions as he has right knee that has a ruptured quad tendon causing him to be unable to bear weight on this side and has significant neuropathy  in both legs , again this causes transfers to be unsafe and unreliable as his knees will give at any time, he has had some falls at home and he feels that due to the PT interventions that he was able to get up on his own, not hurting his wife or having to have an ambulance come to the house and get him up.    WE continue to work on his overall health and hopefully his safety with transfers.    HE is doing the Nustep at home he is still having some hip pain, he does report not taking my advice fully about taking days off.  He still is unsafe at time with the transfers especially when he is fatigued, he is still impulsive and goes too fast at times. The right leg has no quad tendon and the left leg has neuropathy so he is at a very high risk for falls and for regression as he could not exercise much on his own and his wife cannot help him with the standing I feel CGA and blocking the knees are needed due to at any time the knees can buckle, we have tried to teach him to do hip extension to keep the knee locked but he does not do this.  We have set up the maintenance program following medicare guidelines.  He qualifies for this due to the inability to be safe, has had multiple falls and is impulsive, tends to think he can do more than he can.  Patient has not  had a fall in the past 5 weeks, he remains very unsafe at times and with any transfer needs min A or close CGA.  Tends to not understand that the right knee has no quad to hold the leg straight.  I  In the long run he is impulsive and unsafe and cannot exercise on his own to maintain his level, he is at a high risk for falls and there is no gym that has the ability to assist him. Session completed at mat table to eliminate trunk support for improved core stabilization. Core weakness with seated OHP.  Pt did report a fall yesterday stated he was standing pulling his pants up, I advised pt that he shouldn't put weight in his RLE, He ws adamant that he could put a little on it.     Justifying Skilled Maintenance for Rehab Services:   The goal of a skilled maintenance therapy program is to maintain the patient's current functional status or to prevent or slow deterioration. If a patient who is receiving restorative therapy then requires skilled maintenance therapy based on the skills and judgment of the therapist, development of a maintenance program would occur during the last visit for restorative treatment.    Does the patient need treatment that necessitates skilled services that can only be provided by a skilled provider? Can a caregiver provide these services why or why not? 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  He is impulsive and at times very unsafe he could not do this on his own and his wife could not help him., history of COPD, CKD, depression, HTN, kyphosis, a-fib, PVD, spinal stenosis, and stroke as well as RC repair.  These limit the patient's independent carryover with  PT  exercises and require max cueing for optimal performance of safety and to strengthen other mms for his future in therapy  sessions.    The patient requires the skill of PT to help with ongoing exercise and mobility to prevent rapid and significant decline in functional mobility, strength, and to limit falls with his safety.  Therefore, continued skilled maintenance therapy is recommended for this patient.    OBJECTIVE IMPAIRMENTS: Abnormal gait, cardiopulmonary status limiting activity, decreased activity tolerance, decreased balance, decreased coordination, decreased endurance, decreased mobility, difficulty walking, decreased ROM, decreased strength, improper body mechanics, postural dysfunction, and poor safety.   REHAB POTENTIAL: Fair dependent on carry over and patient compliance  CLINICAL DECISION MAKING: Evolving/moderate complexity  EVALUATION COMPLEXITY: Moderate  GOALS: Goals for skilled maintenance:     Long Term Goals: Target Date 12/13/23     The patient will preserve functional mobility within the home environment. Baseline: uses a w/c            Goal Status: met 01/15/24   2. The patient will maintain strength for transfers.            Baseline: able to perform with set up and at times requires mod A and at other time may need Max A due to poor safety and impulsiveness            Goal Status: progressing 02/26/24   3. The patient will minimize fall frequency.            Baseline: he has been having about a fall a month            Goal Status: progressing 02/26/24   4. Safe with HEP at home and with equipment he has            Baseline: does HEP             Goal Status: progressing 02/26/24   PLAN:  PT FREQUENCY: 1x/week  PT DURATION: 12 weeks  PLANNED INTERVENTIONS: Therapeutic exercises, Therapeutic activity, Neuromuscular re-education, Balance training, Gait training, Patient/Family education, Self Care, Joint mobilization, Stair training, Cryotherapy, Moist heat, and Manual therapy.  PLAN FOR NEXT SESSION: Have sat up the maintenance program for Mr. Hopping and progressing with  this, biggest issue he is unsafe and cannot do on his own or with his wife.  Trying to maintain his level of independence of living at home with wife and safety, may try VOR  OBADIAH OZELL ORN, PT  "

## 2024-04-09 NOTE — Discharge Instructions (Signed)
 We have cleared the wax from your ears today and there appears to be some concern for infection to the ear canal. Apply 4 drops into the right ear 3 times daily for 5 days for coverage of this. Follow-up with your primary care provider or return here as needed.

## 2024-04-09 NOTE — ED Triage Notes (Signed)
 Pt c/o right ear fullness. He has been using ear wax softener.

## 2024-04-09 NOTE — ED Provider Notes (Signed)
 [facility]    CSN: [id number] Arrival date & time: 04/09/24  1229      History   Chief Complaint Chief Complaint  Patient presents with   Otalgia    HPI Brandon Mendoza. is a 84 y.o. male.   Patient presents with right ear pain and fullness over the last 2 days.  Patient reports that he did try using an earwax removal kit without relief.  Patient denies noticing any drainage from the ear.  Patient reports that he is not having difficulty hearing from that ear.  The history is provided by the patient and medical records.  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   The spinal cord appears normal. 2.   No spinal stenosis. 3.   Mild multilevel degenerative changes as detailed above that do not lead to spinal stenosis or nerve root compression. 4.   T2 hyperintense foci within the pons consistent with chronic microvascular ischemic changes.   Loop - Medtr[employer] Linq 10/22/2020 10/22/2020   Nocturia    Paroxysmal atrial fibrillation (HCC)    Peripheral neuropathy    Peripheral vascular disease    Sleep apnea    on 6 cm, nasal pillow   Spinal stenosis    Stroke (HCC) 05/01/2011   ischemic   Weakness generalized 10/21/2021   Severe, legs and arms    Patient Active Problem List   Diagnosis Date  Noted   History of stroke 02/06/2024   Coronary artery calcification 05/09/2023   COPD (chronic obstructive pulmonary disease) (HCC) 07/08/2022   Macular degeneration 03/01/2022   Lumbar spondylosis 01/03/2022   Acquired clawfoot, left foot 12/16/2021   Acquired clawfoot, right foot 12/16/2021   Spinal stenosis at L4-L5 level 12/10/2021   Ataxia 11/22/2021   Claudication of lower extremity 10/21/2021   Atrophy of calf muscles 10/21/2021   Weakness generalized 10/21/2021   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   Disequilibrium 05/28/2021   S/P lumbar fusion 12/24/2020   Acquired complex renal cyst 11/23/2020   Functional gait abnormality 11/04/2020   Idiopathic neuropathy 11/02/2020   Loop - Medtronic Linq 10/22/2020 10/22/2020   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   Recurrent falls 11/17/2019   Paroxysmal atrial fibrillation (HCC) 10/22/2019   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   Urinary urgency 04/13/2018   Other intervertebral disc degeneration, lumbar region 06/16/2017   Acquired bilateral hammer toes 10/05/2015   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   Generalized anxiety disorder 05/12/2015   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   Essential hypertension 04/10/2014   Occlusion and stenosis of carotid artery without mention of cerebral infarction 12/15/2011   Mixed hyperlipidemia  Past Surgical History:  Procedure Laterality Date   ENDARTERECTOMY  12/09/2011   Procedure: ENDARTERECTOMY CAROTID;  Surgeon: Krystal JULIANNA Doing, MD;  Location: [facility] OR;  Service: Vascular;  Laterality: Left;   EYE SURGERY     rt retina detachment   HAMMER TOE SURGERY  03/28/2002   HERNIA REPAIR  03/28/2004   I & D EXTREMITY Right 10/07/2022   Procedure: IRRIGATION AND DEBRIDEMENT EXTREMITY WITH WOUND CLOSURE;  Surgeon: Burnetta Aures, MD;  Location: [facility];  Service: Orthopedics;  Laterality: Right;   QUADRICEPS TENDON REPAIR Right  07/12/2022   Procedure: REPAIR QUADRICEP TENDON;  Surgeon: Ernie Cough, MD;  Location: [facility];  Service: Orthopedics;  Laterality: Right;   QUADRICEPS TENDON REPAIR Right 08/30/2022   Procedure: REPAIR QUADRICEP TENDON;  Surgeon: Ernie Cough, MD;  Location: [facility];  Service: Orthopedics;  Laterality: Right;   SHOULDER ARTHROSCOPY W/ ROTATOR CUFF REPAIR  03/28/2008   TONSILLECTOMY         Home Medications    Prior to Admission medications  Medication Sig Start Date End Date Taking? Authorizing Provider  neomycin -polymyxin-hydrocortisone (CORTISPORIN) 3.5-10000-1 OTIC suspension Place 4 drops into the right ear 3 (three) times daily. 04/09/24  Yes Johnie Flaming A, NP  aspirin  EC 81 MG tablet Take 81 mg by mouth daily. Swallow whole. Four times a week (Sunday, Monday, Wednesday and Friday).    [provider]  flecainide  (TAMBOCOR ) 50 MG tablet Take 1 tablet (50 mg total) by mouth 2 (two) times daily. 02/21/24   Kennyth Chew, MD  losartan -hydrochlorothiazide  [facility]) 50-12.5 MG tablet Take 1 tablet by mouth every morning. 10/24/23   Jesus Bernardino MATSU, MD  melatonin 5 MG TABS 1 tablet in the evening Orally Once a day    [provider]  rosuvastatin  (CRESTOR ) 10 MG tablet TAKE 1 TABLET EVERY EVENING 02/25/24   Jesus Bernardino MATSU, MD  sertraline  (ZOLOFT ) 100 MG tablet Take 1 tablet (100 mg total) by mouth daily. 03/13/24   Thedora Garnette HERO, MD    Family History Family History  Problem Relation Age of Onset   Alzheimer's disease Father    Diabetes Maternal Uncle    Diabetes Other     Social History Social History[1]   Allergies   Patient has no known allergies.   Review of Systems Review of Systems  HENT:  Positive for ear pain.    Per HPI  Physical Exam Triage Vital Signs ED Triage Vitals [04/09/24 1241]  Encounter Vitals Group     BP 127/75     Girls Systolic BP Percentile      Girls Diastolic BP Percentile      Boys Systolic BP Percentile       Boys Diastolic BP Percentile      Pulse Rate 68     Resp 16     Temp 97.6 F (36.4 C)     Temp Source Oral     SpO2 95 %     Weight      Height      Head Circumference      Peak Flow      Pain Score      Pain Loc      Pain Education      Exclude from Growth Chart    No data found.  Updated Vital Signs BP 127/75 (BP Location: Right Arm)   Pulse 68   Temp 97.6 F (36.4 C) (Oral)   Resp 16   SpO2 95%   Visual Acuity  Right Eye Distance:   Left Eye Distance:   Bilateral Distance:    Right Eye Near:   Left Eye Near:    Bilateral Near:     Physical Exam Vitals and nursing note reviewed.  Constitutional:      General: He is awake. He is not in acute distress.    Appearance: Normal appearance. He is well-developed and well-groomed. He is not ill-appearing.  HENT:     Right Ear: Swelling present. There is impacted cerumen.     Left Ear: There is impacted cerumen.     Ears:     Comments: Upon reassessment after ear lavage there does appear to be some swelling and erythema to right ear canal concerning for otitis externa. Skin:    General: Skin is warm and dry.  Neurological:     General: No focal deficit present.     Mental Status: He is alert and oriented to person, place, and time. Mental status is at baseline.  Psychiatric:        Behavior: Behavior is cooperative.      UC Treatments / Results  Labs (all labs ordered are listed, but only abnormal results are displayed) Labs Reviewed - No data to display  EKG   Radiology No results found.  Procedures Procedures (including critical care time)  Medications Ordered in UC Medications - No data to display  Initial Impression / Assessment and Plan / UC Course  I have reviewed the triage vital signs and the nursing notes.  Pertinent labs & imaging results that were available during my care of the patient were reviewed by me and considered in my medical decision making (see chart for details).      Patient is overall well-appearing.  Vitals are stable.  Ear lavage performed.  Prescribed Cortisporin to cover for otitis externa.  Discussed follow-up and return precautions. Final Clinical Impressions(s) / UC Diagnoses   Final diagnoses:  Bilateral impacted cerumen  Infective otitis externa of right ear     Discharge Instructions      We have cleared the wax from your ears today and there appears to be some concern for infection to the ear canal. Apply 4 drops into the right ear 3 times daily for 5 days for coverage of this. Follow-up with your primary care provider or return here as needed.     ED Prescriptions     Medication Sig Dispense Auth. Provider   neomycin -polymyxin-hydrocortisone (CORTISPORIN) 3.5-10000-1 OTIC suspension Place 4 drops into the right ear 3 (three) times daily. 10 mL Johnie Flaming A, NP      PDMP not reviewed this encounter.     [1]  Social History Tobacco Use   Smoking status: Former    Current packs/day: 0.00    Average packs/day: 1 pack/day for 50.0 years (50.0 ttl pk-yrs)    Types: Cigarettes    Start date: 08/27/1954    Quit date: 08/26/2004    Years since quitting: 19.6    Passive exposure: Never   Smokeless tobacco: Never   Tobacco comments:    quit smoking 08/2004  Vaping Use   Vaping status: Never Used  Substance Use Topics   Alcohol use: Not Currently    Comment: Rare   Drug use: No     Johnie Flaming A, NP 04/09/24 1329  "

## 2024-04-12 ENCOUNTER — Encounter

## 2024-04-13 ENCOUNTER — Ambulatory Visit

## 2024-04-16 ENCOUNTER — Encounter: Payer: Self-pay | Admitting: Physical Therapy

## 2024-04-16 ENCOUNTER — Ambulatory Visit: Admitting: Physical Therapy

## 2024-04-16 ENCOUNTER — Ambulatory Visit

## 2024-04-16 DIAGNOSIS — R2689 Other abnormalities of gait and mobility: Secondary | ICD-10-CM

## 2024-04-16 DIAGNOSIS — I480 Paroxysmal atrial fibrillation: Secondary | ICD-10-CM

## 2024-04-16 DIAGNOSIS — M6281 Muscle weakness (generalized): Secondary | ICD-10-CM

## 2024-04-16 DIAGNOSIS — M5459 Other low back pain: Secondary | ICD-10-CM

## 2024-04-16 DIAGNOSIS — R296 Repeated falls: Secondary | ICD-10-CM

## 2024-04-16 DIAGNOSIS — M25561 Pain in right knee: Secondary | ICD-10-CM

## 2024-04-16 DIAGNOSIS — M6249 Contracture of muscle, multiple sites: Secondary | ICD-10-CM

## 2024-04-16 NOTE — Therapy (Signed)
 " OUTPATIENT PHYSICAL THERAPY TREATMENT    Patient Name: Brandon Mendoza. MRN: [id number] DOB:11/21/1940, 84 y.o., male Today's Date: 04/16/2024  END OF SESSION:  PT End of Session - 04/16/24 0850     Visit Number 65    Date for Recertification  05/02/24    Authorization Type Medicare    PT Start Time 0843    PT Stop Time 0928    PT Time Calculation (min) 45 min    Activity Tolerance Patient tolerated treatment well    Behavior During Therapy [facility] for tasks assessed/performed          Past Medical History:  Diagnosis Date   Acquired clawfoot, right foot 12/16/2021   Anxiety    Atrophy of calf muscles 10/21/2021   Duplex calves was negative for significant blockage EMG was done at [facility] MRI lumbar spine was also done at [facility]   Carotid artery occlusion    CKD (chronic kidney disease)    Stage 3   COPD (chronic obstructive pulmonary disease) (HCC)    Depression    Dysrhythmia    PAF, atrial tachycardia   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   The spinal cord appears normal. 2.   No spinal stenosis. 3.   Mild multilevel degenerative changes as detailed above that do not lead to spinal stenosis or nerve root compression. 4.   T2 hyperintense foci within the pons consistent with chronic microvascular ischemic changes.   Loop - Medtronic Linq 10/22/2020 10/22/2020   Nocturia    Paroxysmal atrial fibrillation (HCC)    Peripheral neuropathy    Peripheral vascular disease    Sleep apnea    on 6 cm, nasal pillow   Spinal stenosis    Stroke (HCC) 05/01/2011   ischemic   Weakness generalized 10/21/2021   Severe, legs and arms   Past Surgical History:  Procedure Laterality Date   ENDARTERECTOMY  12/09/2011   Procedure: ENDARTERECTOMY  CAROTID;  Surgeon: Krystal JULIANNA Doing, MD;  Location: [facility] OR;  Service: Vascular;  Laterality: Left;   EYE SURGERY     rt retina detachment   HAMMER TOE SURGERY  03/28/2002   HERNIA REPAIR  03/28/2004   I & D EXTREMITY Right 10/07/2022   Procedure: IRRIGATION AND DEBRIDEMENT EXTREMITY WITH WOUND CLOSURE;  Surgeon: Burnetta Aures, MD;  Location: [facility] ORS;  Service: Orthopedics;  Laterality: Right;   QUADRICEPS TENDON REPAIR Right 07/12/2022   Procedure: REPAIR QUADRICEP TENDON;  Surgeon: Ernie Cough, MD;  Location: [facility];  Service: Orthopedics;  Laterality: Right;   QUADRICEPS TENDON REPAIR Right 08/30/2022   Procedure: REPAIR QUADRICEP TENDON;  Surgeon: Ernie Cough, MD;  Location: [facility];  Service: Orthopedics;  Laterality: Right;   SHOULDER ARTHROSCOPY W/ ROTATOR CUFF REPAIR  03/28/2008   TONSILLECTOMY     Patient Active Problem List   Diagnosis Date Noted   History of stroke 02/06/2024   Coronary artery calcification 05/09/2023   COPD (chronic obstructive pulmonary disease) (HCC) 07/08/2022   Macular degeneration 03/01/2022   Lumbar spondylosis 01/03/2022   Acquired clawfoot, left foot 12/16/2021   Acquired clawfoot, right foot 12/16/2021   Spinal stenosis at L4-L5 level 12/10/2021   Ataxia 11/22/2021   Claudication  of lower extremity 10/21/2021   Atrophy of calf muscles 10/21/2021   Weakness generalized 10/21/2021   Kyphosis 10/21/2021   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   Disequilibrium 05/28/2021   S/P lumbar fusion 12/24/2020   Acquired complex renal cyst 11/23/2020   Functional gait abnormality 11/04/2020   Idiopathic neuropathy 11/02/2020   Loop - Medtronic Linq 10/22/2020 10/22/2020   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   Recurrent falls 11/17/2019   Paroxysmal atrial fibrillation (HCC) 10/22/2019   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   Urinary urgency 04/13/2018   Other intervertebral disc degeneration, lumbar region 06/16/2017   Acquired bilateral hammer  toes 10/05/2015   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   Generalized anxiety disorder 05/12/2015   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   Essential hypertension 04/10/2014   Occlusion and stenosis of carotid artery without mention of cerebral infarction 12/15/2011   Mixed hyperlipidemia     PCP: Bernardino Cone  REFERRING PROVIDER: Bernardino Cone  REFERRING DIAG: weakness, multiple falls  Rationale for Evaluation and Treatment: Rehabilitation  THERAPY DIAG:  Other abnormalities of gait and mobility  Repeated falls  Other low back pain  Acute pain of right knee  Contracture of muscle, multiple sites  Muscle weakness (generalized)  ONSET DATE: 10/07/22  SUBJECTIVE:                                                                                                                                                                                           SUBJECTIVE STATEMENT: Patient reports a fall in the bathroom over the weekend, hit his elbow.  Was able to get to a place and get up on his own.  Denies injury   Patient asking about plan, he reports that he is worried that he will not be able to stay independent and live at home if he stops, he has no equipment and with the right knee's inability to extend and the left knee having issues with buckling due to neuropathy, he is worried about more falls and the strain on his wife   PERTINENT HISTORY:  Cerebral infarct 10/21/21, lumbar fusion, recurrent falls, has significant neuropathy and this is the reason for most of his falls   PAIN:  Are you having pain? Yes: NPRS scale: 0/10 Pain location: right knee Pain description: ache  Aggravating factors: being on my feet  Relieving factors: movement  PRECAUTIONS: Fall  WEIGHT BEARING RESTRICTIONS: No  FALLS:  Has patient fallen in last 6 months? Yes. Number of falls every few days   LIVING ENVIRONMENT: Lives with: lives with their spouse Lives in:  House/apartment Stairs: Yes: Internal: 20 steps; on right  going up Has following equipment at home: [name] - 4 wheeled, Wheelchair (manual), shower chair, Tour manager, Grab bars, Ramped entry, and trekking pole  OCCUPATION: Retired  PLOF: Independent, prior to a year ago he was driving used trekking poles to walk  PATIENT GOALS: live at home  NEXT MD VISIT:   OBJECTIVE:   DIAGNOSTIC FINDINGS:    LUMBAR SPINE FINDINGS: 1. Wide laminectomy and posterior pedicle screw and rod fixation at L4-5 without residual or recurrent stenosis. 2. Progressive adjacent level disease at L3-4 with moderate right and mild left subarticular narrowing and moderate foraminal stenosis bilaterally. Progression is predominantly due to facet disease 3. Progressive moderate facet hypertrophy at L1-2 with a progressive rightward disc protrusion resulting in progressive moderate right foraminal stenosis. Asymmetric endplate marrow edema on the right at L1-2 is consistent with progressive disease as well. 4. Otherwise stable degenerative change without other focal stenosis.   THORACIC SPINE FINDINGS:  On sagittal views the vertebral bodies have normal height and alignment.  Hemangioma within T9 vertebral body and slightly within T10 vertebral body.  Anterior kyphosis.  Mild scoliosis convex right centered at T8 level. The spinal cord is normal in size and appearance. The paraspinal soft tissues are unremarkable.     On axial views there is no spinal stenosis or foraminal narrowing.  Limited views of the aorta, kidneys, liver, lungs and paraspinal muscles are notable for multiple large right renal cysts ranging in size from 1.5 to 7 cm.   COGNITION: Overall cognitive status: Within functional limits for tasks assessed     SENSATION: WFL  MUSCLE LENGTH: Hamstrings: mod tightness on both sides  POSTURE: rounded shoulders and forward head  LOWER EXTREMITY ROM:   the right knee has HS, has no quad tendon  on the right, unable to lift the leg, left leg WFL's , left hip and right hip and ankles WFL's   LOWER EXTREMITY MMT: 4-/5 for the LE's except for the right knee extension UPPER EXTREMITY MMT:  4-/5 for shoulders and elbows   FUNCTIONAL TESTS:  5XSTS: 83 seconds really has to use his hands and is unsteady with standing Cannot stand on his own without holding onto something due to poor balance   Transfers:  needs set up and MinA for safety, is impulsive and unsafe at times. GAIT: He is w/c dependent    TODAY'S TREATMENT:                                                                                                                              DATE:  04/16/24 3# seated marches 2x10 3# in pbars PT blocking knees marching and hip abduction 2x15 45# HS curls 2x15 10# left leg extension 15# rows 15# straight arm extension 15# chest press 25# biceps 25# triceps Passive right knee extension Feet on ball K2C, rotation, bridge, isometric abs Blue tband clamshells Ball b/n knees squeeze Blue tband right hip extension  04/09/24 Leg curls 45#  3x15 Leg ext 10# 3x15 2.5# seated marches 2.5# marches and hip abduction in the pbars with PT blocking knees 15# rows 15# straight arm pulls 15# chest press 25# biceps 25# triceps Feet on ball K2C, rotation, bridge, isometric abs Passive right knee extension, left hip piriformis Ball b/n knees squeeze Blue tband clamshells VOR eye ROM and saccades  04/01/24 2.5# standing in pbars hip flexion, abduction with PT blocking knee Seated 2.5# hip flexion 15# rows 15# straight arm ext 15# chest press 25# triceps 25# biceps 45# leg curls 10# leg ext Feet on ball K2C, rotation, bridge, isometric abs Black tband supine hip extension to focus on the right leg as it was not working doing bridges on the ball Supine hip IR as it ER's significantly   03/25/24 In pbars 2.5# marches and hip abd with PT blocking knees Seated 2.5 # marches Leg  curls 45# 3x15 Leg ext 10# 3x15 Leg press 30# 15# straight arm pulls 15# rows 15# chest press 25# triceps overhead 25# biceps Feet on ball K2C, rotation, bridge, isometric abs   03/18/24 2.5# standing in Pbars PT blocking knees marches and abduction 25# triceps 25# biceps 15# rows 15# chest press 15# straight arm pulls 45# HS curls, 2x15, then right only 25# 2x10 10# leg ext Leg press 40# 15# AR press Feet on ball K2C, rotation, bridge, isometric abs Black tband clamshells  03/11/24 Standing pbars 2.5# marches and hip abduction with PT blocking knees 25# biceps 15# straight arm pulls 15# rows 15# chest press 25# triceps 45# HS curls 10#  left leg extension 40# leg press  all above 2 x15 reps Feet on ball K2C, rotation, bridges, isometric abs Passive right knee extension Black tband clamshells  03/05/24 15# Row 15# straight arm pulls 2x15 15# chest press 25# triceps 25# biceps In pbars 2.5# marches and abduction in standing with PT blocking knees 45# HS curls 10# leg extension Feet on ball K2C, rotation, bridge, isometric abs Seated elbow touches  02/26/24 In pbars 2.5# marches and abduction PT blocking knees 2.5# seated marches 15# rows 15# straight arm pulls 15# chest press 25# triceps 25# biceps Transfer with Walker from w/c to mat table CGA 30 seconds Feet on ball K2C, rotation, bridges. Isometric abs Passive right knee stretch into extension Ball b/n knees squeeze Blue tband clamshells Gait with CGA using FWW and close WC follow x 30 feet all weight are on hands  02/20/24 In pbars 2.5# marches and hip abduction with PT blocking knees 45# HS curls, RLE 20lb 2x15 10# leg extension 15# rows 15# extension 15# chest press 10lb chest flys  25# biceps. 25# triceps Passive stretch left HS and piriformis  PATIENT EDUCATION:  Education details: POC Person educated: Patient Education method: Explanation Education comprehension: verbalized  understanding  HOME EXERCISE PROGRAM:  ASSESSMENT:  CLINICAL IMPRESSION: Patient had another fall at home this weekend, he was able to get to a place and get up on his own.  This again goes to the issue of him being unsafe and PT required for all transfers in a setting of a gym or to do exercises.  Patient continues to live with wife at home, he is doing nustep at home, but has no ability to do other exercises that are strong enough to benefit him, other issues include unsafe transfers, legs buckling and giving, neuropathy and a torn right quad tendon.  He can transfer with his wife but again is very unsafe and would not be able  to go to gym due to safety issues, in clinic he needs setup and CGA, sometimes needs PT to block knee so it does not give out   He does have a posterior walker at home that he is trying to use some.  I continue to work with Mr Silguero regarding his current level and his ability to stay home and live independently with his wife.  His diagnosis is complicated in a way that requires continued skilled interventions to maintain his current level, he is unsafe with transfers and would not be able to do any significant exercise without skilled interventions as he has right knee that has a ruptured quad tendon causing him to be unable to bear weight on this side and has significant neuropathy in both legs , again this causes transfers to be unsafe and unreliable as his knees will give at any time, he has had some falls at home and he feels that due to the PT interventions that he was able to get up on his own, not hurting his wife or having to have an ambulance come to the house and get him up.    WE continue to work on his overall health and hopefully his safety with transfers.    HE is doing the Nustep at home he is still having some hip pain, he does report not taking my advice fully about taking days off.  He still is unsafe at time with the transfers especially when he is fatigued, he  is still impulsive and goes too fast at times. The right leg has no quad tendon and the left leg has neuropathy so he is at a very high risk for falls and for regression as he could not exercise much on his own and his wife cannot help him with the standing I feel CGA and blocking the knees are needed due to at any time the knees can buckle, we have tried to teach him to do hip extension to keep the knee locked but he does not do this.  We have set up the maintenance program following medicare guidelines.  He qualifies for this due to the inability to be safe, has had multiple falls and is impulsive, tends to think he can do more than he can.  Patient has not had a fall in the past 5 weeks, he remains very unsafe at times and with any transfer needs min A or close CGA.  Tends to not understand that the right knee has no quad to hold the leg straight.  I  In the long run he is impulsive and unsafe and cannot exercise on his own to maintain his level, he is at a high risk for falls and there is no gym that has the ability to assist him. Session completed at mat table to eliminate trunk support for improved core stabilization. Core weakness with seated OHP.  Pt did report a fall yesterday stated he was standing pulling his pants up, I advised pt that he shouldn't put weight in his RLE, He ws adamant that he could put a little on it.     Justifying Skilled Maintenance for Rehab Services:   The goal of a skilled maintenance therapy program is to maintain the patient's current functional status or to prevent or slow deterioration. If a patient who is receiving restorative therapy then requires skilled maintenance therapy based on the skills and judgment of the therapist, development of a maintenance program would occur during the last visit for  restorative treatment.    Does the patient need treatment that necessitates skilled services that can only be provided by a skilled provider? Can a caregiver provide these  services why or why not? 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  He is impulsive and at times very unsafe he could not do this on his own and his wife could not help him., history of COPD, CKD, depression, HTN, kyphosis, a-fib, PVD, spinal stenosis, and stroke as well as RC repair.  These limit the patient's independent carryover with PT  exercises and require max cueing for optimal performance of safety and to strengthen other mms for his future in therapy sessions.    The patient requires the skill of PT to help with ongoing exercise and mobility to prevent rapid and significant decline in functional mobility, strength, and to limit falls with his safety.  Therefore, continued skilled maintenance therapy is recommended for this patient.    OBJECTIVE IMPAIRMENTS: Abnormal gait, cardiopulmonary status limiting activity, decreased activity tolerance, decreased balance, decreased coordination, decreased endurance, decreased mobility, difficulty walking, decreased ROM, decreased strength, improper body mechanics, postural dysfunction, and poor safety.   REHAB POTENTIAL: Fair dependent on carry over and patient compliance  CLINICAL DECISION MAKING: Evolving/moderate complexity  EVALUATION COMPLEXITY: Moderate  GOALS: Goals for skilled maintenance:     Long Term Goals: Target Date 12/13/23     The patient will preserve functional mobility within the home environment. Baseline: uses a w/c            Goal Status: met 01/15/24   2. The patient will maintain strength for transfers.            Baseline: able to perform with set up and at times requires mod A and at other time may need Max A due to poor safety and impulsiveness            Goal Status: progressing 02/26/24   3. The patient will minimize  fall frequency.            Baseline: he has been having about a fall a month            Goal Status: progressing 02/26/24   4. Safe with HEP at home and with equipment he has            Baseline: does HEP             Goal Status: progressing 02/26/24   PLAN:  PT FREQUENCY: 1x/week  PT DURATION: 12 weeks  PLANNED INTERVENTIONS: Therapeutic exercises, Therapeutic activity, Neuromuscular re-education, Balance training, Gait training, Patient/Family education, Self Care, Joint mobilization, Stair training, Cryotherapy, Moist heat, and Manual therapy.  PLAN FOR NEXT SESSION: Have sat up the maintenance program for Mr. Ramson and progressing with this, biggest issue he is unsafe and cannot do on his own or with his wife.  Trying to maintain his level of independence of living at home with wife and safety, may try VOR  OBADIAH OZELL ORN, PT  "

## 2024-04-17 LAB — CUP PACEART REMOTE DEVICE CHECK
Date Time Interrogation Session: 20260119231140
Implantable Pulse Generator Implant Date: 20220728

## 2024-04-18 ENCOUNTER — Encounter: Payer: Self-pay | Admitting: Cardiology

## 2024-04-19 NOTE — Progress Notes (Signed)
 Remote Loop Recorder Transmission

## 2024-04-21 ENCOUNTER — Ambulatory Visit: Payer: Self-pay | Admitting: Cardiology

## 2024-04-22 ENCOUNTER — Ambulatory Visit: Admitting: Physical Therapy

## 2024-04-25 ENCOUNTER — Ambulatory Visit: Admitting: Physical Therapy

## 2024-04-25 ENCOUNTER — Encounter: Payer: Self-pay | Admitting: Physical Therapy

## 2024-04-25 DIAGNOSIS — R2689 Other abnormalities of gait and mobility: Secondary | ICD-10-CM | POA: Diagnosis not present

## 2024-04-25 DIAGNOSIS — M5459 Other low back pain: Secondary | ICD-10-CM

## 2024-04-25 DIAGNOSIS — R296 Repeated falls: Secondary | ICD-10-CM

## 2024-04-25 NOTE — Therapy (Signed)
 " OUTPATIENT PHYSICAL THERAPY TREATMENT    Patient Name: Brandon Mendoza. MRN: [id number] DOB:[birth year], 84 y.o., male Today's Date: 04/25/2024  END OF SESSION:  PT End of Session - 04/25/24 1315     Visit Number [id number]    Date for Recertification  05/02/24    Authorization Type Medicare    PT Start Time 1311    PT Stop Time 1400    PT Time Calculation (min) 49 min    Activity Tolerance Patient tolerated treatment well    Behavior During Therapy WFL for tasks assessed/performed          Past Medical History:  Diagnosis Date   Acquired clawfoot, right foot 12/16/2021   Anxiety    Atrophy of calf muscles 10/21/2021   Duplex calves was negative for significant blockage EMG was done at [facility] MRI lumbar spine was also done at [facility]   Carotid artery occlusion    CKD (chronic kidney disease)    Stage 3   COPD (chronic obstructive pulmonary disease) (HCC)    Depression    Dysrhythmia    PAF, atrial tachycardia   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   The spinal cord appears normal. 2.   No spinal stenosis. 3.   Mild multilevel degenerative changes as detailed above that do not lead to spinal stenosis or nerve root compression. 4.   T2 hyperintense foci within the pons consistent with chronic microvascular ischemic changes.   Loop - Medtronic Linq 10/22/2020 10/22/2020   Nocturia    Paroxysmal atrial fibrillation (HCC)    Peripheral neuropathy    Peripheral vascular disease    Sleep apnea    on 6 cm, nasal pillow   Spinal stenosis    Stroke (HCC) 05/01/2011   ischemic   Weakness generalized 10/21/2021   Severe, legs and arms   Past Surgical History:  Procedure Laterality Date   ENDARTERECTOMY  12/09/2011   Procedure: ENDARTERECTOMY  CAROTID;  Surgeon: Brandon JULIANNA Doing, MD;  Location: [facility] OR;  Service: Vascular;  Laterality: Left;   EYE SURGERY     rt retina detachment   HAMMER TOE SURGERY  03/28/2002   HERNIA REPAIR  03/28/2004   I & D EXTREMITY Right 10/07/2022   Procedure: IRRIGATION AND DEBRIDEMENT EXTREMITY WITH WOUND CLOSURE;  Surgeon: Brandon Aures, MD;  Location: [facility] ORS;  Service: Orthopedics;  Laterality: Right;   QUADRICEPS TENDON REPAIR Right 07/12/2022   Procedure: REPAIR QUADRICEP TENDON;  Surgeon: Brandon Cough, MD;  Location: [facility];  Service: Orthopedics;  Laterality: Right;   QUADRICEPS TENDON REPAIR Right 08/30/2022   Procedure: REPAIR QUADRICEP TENDON;  Surgeon: Brandon Cough, MD;  Location: [facility];  Service: Orthopedics;  Laterality: Right;   SHOULDER ARTHROSCOPY W/ ROTATOR CUFF REPAIR  03/28/2008   TONSILLECTOMY     Patient Active Problem List   Diagnosis Date Noted   History of stroke 02/06/2024   Coronary artery calcification 05/09/2023   COPD (chronic obstructive pulmonary disease) (HCC) 07/08/2022   Macular degeneration 03/01/2022   Lumbar spondylosis 01/03/2022   Acquired clawfoot, left foot 12/16/2021   Acquired clawfoot, right foot 12/16/2021   Spinal stenosis at L4-L5 level 12/10/2021   Ataxia 11/22/2021   Claudication  of lower extremity 10/21/2021   Atrophy of calf muscles 10/21/2021   Weakness generalized 10/21/2021   Kyphosis 10/21/2021   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   Disequilibrium 05/28/2021   S/P lumbar fusion 12/24/2020   Acquired complex renal cyst 11/23/2020   Functional gait abnormality 11/04/2020   Idiopathic neuropathy 11/02/2020   Loop - Medtronic Linq 10/22/2020 10/22/2020   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   Recurrent falls 11/17/2019   Paroxysmal atrial fibrillation (HCC) 10/22/2019   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   Urinary urgency 04/13/2018   Other intervertebral disc degeneration, lumbar region 06/16/2017   Acquired bilateral hammer  toes 10/05/2015   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   Generalized anxiety disorder 05/12/2015   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   Essential hypertension 04/10/2014   Occlusion and stenosis of carotid artery without mention of cerebral infarction 12/15/2011   Mixed hyperlipidemia     PCP: Brandon Mendoza  REFERRING PROVIDER: [name]  REFERRING DIAG: weakness, multiple falls  Rationale for Evaluation and Treatment: Rehabilitation  THERAPY DIAG:  Other abnormalities of gait and mobility  Repeated falls  Other low back pain  ONSET DATE: 10/07/22  SUBJECTIVE:                                                                                                                                                                                           SUBJECTIVE STATEMENT: Patient reports that he has had 2 falls since the last visit, he reports both trying to stand and reach.  Reports sore in the right leg, hip and back   Patient asking about plan, he reports that he is worried that he will not be able to stay independent and live at home if he stops, he has no equipment and with the right knee's inability to extend and the left knee having issues with buckling due to neuropathy, he is worried about more falls and the strain on his wife   PERTINENT HISTORY:  Cerebral infarct 10/21/21, lumbar fusion, recurrent falls, has significant neuropathy and this is the reason for most of his falls   PAIN:  Are you having pain? Yes: NPRS scale: 0/10 Pain location: right knee Pain description: ache  Aggravating factors: being on my feet  Relieving factors: movement  PRECAUTIONS: Fall  WEIGHT BEARING RESTRICTIONS: No  FALLS:  Has patient fallen in last 6 months? Yes. Number of falls every few days   LIVING ENVIRONMENT: Lives with: lives with their spouse Lives in: House/apartment Stairs: Yes: Internal: 20 steps; on right going up Has following equipment at  home: [name] - 4 wheeled, Wheelchair (manual), shower chair, [employer]  bench, Grab bars, Ramped entry, and trekking pole  OCCUPATION: Retired  PLOF: Independent, prior to a year ago he was driving used trekking poles to walk  PATIENT GOALS: live at home  NEXT MD VISIT:   OBJECTIVE:   DIAGNOSTIC FINDINGS:    LUMBAR SPINE FINDINGS: 1. Wide laminectomy and posterior pedicle screw and rod fixation at L4-5 without residual or recurrent stenosis. 2. Progressive adjacent level disease at L3-4 with moderate right and mild left subarticular narrowing and moderate foraminal stenosis bilaterally. Progression is predominantly due to facet disease 3. Progressive moderate facet hypertrophy at L1-2 with a progressive rightward disc protrusion resulting in progressive moderate right foraminal stenosis. Asymmetric endplate marrow edema on the right at L1-2 is consistent with progressive disease as well. 4. Otherwise stable degenerative change without other focal stenosis.   THORACIC SPINE FINDINGS:  On sagittal views the vertebral bodies have normal height and alignment.  Hemangioma within T9 vertebral body and slightly within T10 vertebral body.  Anterior kyphosis.  Mild scoliosis convex right centered at T8 level. The spinal cord is normal in size and appearance. The paraspinal soft tissues are unremarkable.     On axial views there is no spinal stenosis or foraminal narrowing.  Limited views of the aorta, kidneys, liver, lungs and paraspinal muscles are notable for multiple large right renal cysts ranging in size from 1.5 to 7 cm.   COGNITION: Overall cognitive status: Within functional limits for tasks assessed     SENSATION: WFL  MUSCLE LENGTH: Hamstrings: mod tightness on both sides  POSTURE: rounded shoulders and forward head  LOWER EXTREMITY ROM:   the right knee has HS, has no quad tendon on the right, unable to lift the leg, left leg WFL's , left hip and right hip and ankles  WFL's   LOWER EXTREMITY MMT: 4-/5 for the LE's except for the right knee extension UPPER EXTREMITY MMT:  4-/5 for shoulders and elbows   FUNCTIONAL TESTS:  5XSTS: 83 seconds really has to use his hands and is unsteady with standing Cannot stand on his own without holding onto something due to poor balance   Transfers:  needs set up and MinA for safety, is impulsive and unsafe at times. GAIT: He is w/c dependent    TODAY'S TREATMENT:                                                                                                                              DATE:  04/25/24 Feet on ball K2C, rotation, bridge, isometric abs We had a good discussion regarding his safety, we have talked about this in the past with his lack of patience.  And thinking he can do more as well as him possibly Mendoza too much on the nustep at home as he has been Mendoza 30 minutes and reports that he is worn out after this. Passive stretch LE's Ball b/n knees squeeze Blue tband clamshells Red tband ankle PF/DF 15# rows  15# extension 25# biceps 25# triceps 15# chest press  04/16/24 3# seated marches 2x10 3# in pbars PT blocking knees marching and hip abduction 2x15 45# HS curls 2x15 10# left leg extension 15# rows 15# straight arm extension 15# chest press 25# biceps 25# triceps Passive right knee extension Feet on ball K2C, rotation, bridge, isometric abs Blue tband clamshells Ball b/n knees squeeze Blue tband right hip extension  04/09/24 Leg curls 45# 3x15 Leg ext 10# 3x15 2.5# seated marches 2.5# marches and hip abduction in the pbars with PT blocking knees 15# rows 15# straight arm pulls 15# chest press 25# biceps 25# triceps Feet on ball K2C, rotation, bridge, isometric abs Passive right knee extension, left hip piriformis Ball b/n knees squeeze Blue tband clamshells VOR eye ROM and saccades  04/01/24 2.5# standing in pbars hip flexion, abduction with PT blocking knee Seated 2.5#  hip flexion 15# rows 15# straight arm ext 15# chest press 25# triceps 25# biceps 45# leg curls 10# leg ext Feet on ball K2C, rotation, bridge, isometric abs Black tband supine hip extension to focus on the right leg as it was not working Mendoza bridges on the ball Supine hip IR as it ER's significantly   03/25/24 In pbars 2.5# marches and hip abd with PT blocking knees Seated 2.5 # marches Leg curls 45# 3x15 Leg ext 10# 3x15 Leg press 30# 15# straight arm pulls 15# rows 15# chest press 25# triceps overhead 25# biceps Feet on ball K2C, rotation, bridge, isometric abs   03/18/24 2.5# standing in Pbars PT blocking knees marches and abduction 25# triceps 25# biceps 15# rows 15# chest press 15# straight arm pulls 45# HS curls, 2x15, then right only 25# 2x10 10# leg ext Leg press 40# 15# AR press Feet on ball K2C, rotation, bridge, isometric abs Black tband clamshells  03/11/24 Standing pbars 2.5# marches and hip abduction with PT blocking knees 25# biceps 15# straight arm pulls 15# rows 15# chest press 25# triceps 45# HS curls 10#  left leg extension 40# leg press  all above 2 x15 reps Feet on ball K2C, rotation, bridges, isometric abs Passive right knee extension Black tband clamshells  03/05/24 15# Row 15# straight arm pulls 2x15 15# chest press 25# triceps 25# biceps In pbars 2.5# marches and abduction in standing with PT blocking knees 45# HS curls 10# leg extension Feet on ball K2C, rotation, bridge, isometric abs Seated elbow touches  02/26/24 In pbars 2.5# marches and abduction PT blocking knees 2.5# seated marches 15# rows 15# straight arm pulls 15# chest press 25# triceps 25# biceps Transfer with Walker from w/c to mat table CGA 30 seconds Feet on ball K2C, rotation, bridges. Isometric abs Passive right knee stretch into extension Ball b/n knees squeeze Blue tband clamshells Gait with CGA using FWW and close WC follow x 30 feet all  weight are on hands  02/20/24 In pbars 2.5# marches and hip abduction with PT blocking knees 45# HS curls, RLE 20lb 2x15 10# leg extension 15# rows 15# extension 15# chest press 10lb chest flys  25# biceps. 25# triceps Passive stretch left HS and piriformis  PATIENT EDUCATION:  Education details: POC Person educated: Patient Education method: Explanation Education comprehension: verbalized understanding  HOME EXERCISE PROGRAM:  ASSESSMENT:  CLINICAL IMPRESSION: Patient had 2 falls over the past week, again he was able to get up on his own with set up.  He is frustrated and feels like he is really struggling, we did have a  long talk about him rushing and being impatient and not being safe.  He agreed that he rushes and thinks he can do more than he can.  We discussed backing down from the Nustep as he reports Mendoza > 30 minutes and his legs being worn out, we discussed skipping days to allow the legs to recover.  This again goes to the issue of him being unsafe and PT required for all transfers in a setting of a gym or to do exercises.  Patient continues to live with wife at home, he is Mendoza nustep at home, but has no ability to do other exercises that are strong enough to benefit him, other issues include unsafe transfers, legs buckling and giving, neuropathy and a torn right quad tendon.  He can transfer with his wife but again is very unsafe and would not be able to go to gym due to safety issues, in clinic he needs setup and CGA, sometimes needs PT to block knee so it does not give out   He does have a posterior walker at home that he is trying to use some.  I continue to work with Mr Dapolito regarding his current level and his ability to stay home and live independently with his wife.  His diagnosis is complicated in a way that requires continued skilled interventions to maintain his current level, he is unsafe with transfers and would not be able to do any significant exercise without  skilled interventions as he has right knee that has a ruptured quad tendon causing him to be unable to bear weight on this side and has significant neuropathy in both legs , again this causes transfers to be unsafe and unreliable as his knees will give at any time, he has had some falls at home and he feels that due to the PT interventions that he was able to get up on his own, not hurting his wife or having to have an ambulance come to the house and get him up.    WE continue to work on his overall health and hopefully his safety with transfers.    HE is Mendoza the Nustep at home he is still having some hip pain, he does report not taking my advice fully about taking days off.  He still is unsafe at time with the transfers especially when he is fatigued, he is still impulsive and goes too fast at times. The right leg has no quad tendon and the left leg has neuropathy so he is at a very high risk for falls and for regression as he could not exercise much on his own and his wife cannot help him with the standing I feel CGA and blocking the knees are needed due to at any time the knees can buckle, we have tried to teach him to do hip extension to keep the knee locked but he does not do this.  We have set up the maintenance program following medicare guidelines.  He qualifies for this due to the inability to be safe, has had multiple falls and is impulsive, tends to think he can do more than he can.  Patient has not had a fall in the past 5 weeks, he remains very unsafe at times and with any transfer needs min A or close CGA.  Tends to not understand that the right knee has no quad to hold the leg straight.  I  In the long run he is impulsive and unsafe and cannot exercise on his  own to maintain his level, he is at a high risk for falls and there is no gym that has the ability to assist him. Session completed at mat table to eliminate trunk support for improved core stabilization. Core weakness with seated OHP.  Pt  did report a fall yesterday stated he was standing pulling his pants up, I advised pt that he shouldn't put weight in his RLE, He ws adamant that he could put a little on it.     Justifying Skilled Maintenance for Rehab Services:   The goal of a skilled maintenance therapy program is to maintain the patient's current functional status or to prevent or slow deterioration. If a patient who is receiving restorative therapy then requires skilled maintenance therapy based on the skills and judgment of the therapist, development of a maintenance program would occur during the last visit for restorative treatment.    Does the patient need treatment that necessitates skilled services that can only be provided by a skilled provider? Can a caregiver provide these services why or why not? 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  He is impulsive and at times very unsafe he could not do this on his own and his wife could not help him., history of COPD, CKD, depression, HTN, kyphosis, a-fib, PVD, spinal stenosis, and stroke as well as RC repair.  These limit the patient's independent carryover with PT  exercises and require max cueing for optimal performance of safety and to strengthen other mms for his future in therapy sessions.    The patient requires the skill of PT to help with ongoing exercise and mobility to prevent rapid and significant decline in functional mobility, strength, and to limit falls with his safety.  Therefore, continued skilled maintenance therapy is recommended for this patient.    OBJECTIVE IMPAIRMENTS: Abnormal gait, cardiopulmonary status limiting activity, decreased activity tolerance, decreased balance, decreased coordination, decreased endurance, decreased mobility, difficulty walking, decreased  ROM, decreased strength, improper body mechanics, postural dysfunction, and poor safety.   REHAB POTENTIAL: Fair dependent on carry over and patient compliance  CLINICAL DECISION MAKING: Evolving/moderate complexity  EVALUATION COMPLEXITY: Moderate  GOALS: Goals for skilled maintenance:     Long Term Goals: Target Date 12/13/23     The patient will preserve functional mobility within the home environment. Baseline: uses a w/c            Goal Status: met 01/15/24   2. The patient will maintain strength for transfers.            Baseline: able to perform with set up and at times requires mod A and at other time may need Max A due to poor safety and impulsiveness            Goal Status: progressing 02/26/24   3. The patient will minimize fall frequency.            Baseline: he has been having about a fall a month            Goal Status: progressing 02/26/24   4. Safe with HEP at home and with equipment he has            Baseline: does HEP  Goal Status: progressing 02/26/24   PLAN:  PT FREQUENCY: 1x/week  PT DURATION: 12 weeks  PLANNED INTERVENTIONS: Therapeutic exercises, Therapeutic activity, Neuromuscular re-education, Balance training, Gait training, Patient/Family education, Self Care, Joint mobilization, Stair training, Cryotherapy, Moist heat, and Manual therapy.  PLAN FOR NEXT SESSION: Have sat up the maintenance program for Mr. Fleck and progressing with this, biggest issue he is unsafe and cannot do on his own or with his wife.  He reports that he is starting to look at possibly moving to an assisted living  OBADIAH OZELL ORN, PT  "

## 2024-04-29 ENCOUNTER — Ambulatory Visit: Admitting: Physical Therapy

## 2024-05-01 ENCOUNTER — Encounter: Payer: Self-pay | Admitting: Physical Therapy

## 2024-05-01 ENCOUNTER — Ambulatory Visit: Admitting: Physical Therapy

## 2024-05-01 DIAGNOSIS — R2689 Other abnormalities of gait and mobility: Secondary | ICD-10-CM

## 2024-05-01 DIAGNOSIS — M25561 Pain in right knee: Secondary | ICD-10-CM

## 2024-05-01 DIAGNOSIS — M5459 Other low back pain: Secondary | ICD-10-CM

## 2024-05-01 DIAGNOSIS — R296 Repeated falls: Secondary | ICD-10-CM

## 2024-05-01 DIAGNOSIS — M6249 Contracture of muscle, multiple sites: Secondary | ICD-10-CM

## 2024-05-01 DIAGNOSIS — M6281 Muscle weakness (generalized): Secondary | ICD-10-CM

## 2024-05-01 NOTE — Therapy (Signed)
 " OUTPATIENT PHYSICAL THERAPY TREATMENT    Patient Name: Brandon Mendoza. MRN: [id number] DOB:1940-05-19, 84 y.o., male Today's Date: 05/01/2024  END OF SESSION:  PT End of Session - 05/01/24 1110     Visit Number [id number]    Date for Recertification  05/02/24    Authorization Type Medicare    PT Start Time 1100    PT Stop Time 1145    PT Time Calculation (min) 45 min    Activity Tolerance Patient tolerated treatment well    Behavior During Therapy WFL for tasks assessed/performed          Past Medical History:  Diagnosis Date   Acquired clawfoot, right foot 12/16/2021   Anxiety    Atrophy of calf muscles 10/21/2021   Duplex calves was negative for significant blockage EMG was done at [facility] MRI lumbar spine was also done at [facility]   Carotid artery occlusion    CKD (chronic kidney disease)    Stage 3   COPD (chronic obstructive pulmonary disease) (HCC)    Depression    Dysrhythmia    PAF, atrial tachycardia   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   The spinal cord appears normal. 2.   No spinal stenosis. 3.   Mild multilevel degenerative changes as detailed above that do not lead to spinal stenosis or nerve root compression. 4.   T2 hyperintense foci within the pons consistent with chronic microvascular ischemic changes.   Loop - Medtronic Linq 10/22/2020 10/22/2020   Nocturia    Paroxysmal atrial fibrillation (HCC)    Peripheral neuropathy    Peripheral vascular disease    Sleep apnea    on 6 cm, nasal pillow   Spinal stenosis    Stroke (HCC) 05/01/2011   ischemic   Weakness generalized 10/21/2021   Severe, legs and arms   Past Surgical History:  Procedure Laterality Date   ENDARTERECTOMY  12/09/2011   Procedure: ENDARTERECTOMY  CAROTID;  Surgeon: Krystal JULIANNA Doing, MD;  Location: [facility] OR;  Service: Vascular;  Laterality: Left;   EYE SURGERY     rt retina detachment   HAMMER TOE SURGERY  03/28/2002   HERNIA REPAIR  03/28/2004   I & D EXTREMITY Right 10/07/2022   Procedure: IRRIGATION AND DEBRIDEMENT EXTREMITY WITH WOUND CLOSURE;  Surgeon: Burnetta Aures, MD;  Location: [facility] ORS;  Service: Orthopedics;  Laterality: Right;   QUADRICEPS TENDON REPAIR Right 07/12/2022   Procedure: REPAIR QUADRICEP TENDON;  Surgeon: Ernie Cough, MD;  Location: [facility];  Service: Orthopedics;  Laterality: Right;   QUADRICEPS TENDON REPAIR Right 08/30/2022   Procedure: REPAIR QUADRICEP TENDON;  Surgeon: Ernie Cough, MD;  Location: [facility];  Service: Orthopedics;  Laterality: Right;   SHOULDER ARTHROSCOPY W/ ROTATOR CUFF REPAIR  03/28/2008   TONSILLECTOMY     Patient Active Problem List   Diagnosis Date Noted   History of stroke 02/06/2024   Coronary artery calcification 05/09/2023   COPD (chronic obstructive pulmonary disease) (HCC) 07/08/2022   Macular degeneration 03/01/2022   Lumbar spondylosis 01/03/2022   Acquired clawfoot, left foot 12/16/2021   Acquired clawfoot, right foot 12/16/2021   Spinal stenosis at L4-L5 level 12/10/2021   Ataxia 11/22/2021   Claudication  of lower extremity 10/21/2021   Atrophy of calf muscles 10/21/2021   Weakness generalized 10/21/2021   Kyphosis 10/21/2021   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   Disequilibrium 05/28/2021   S/P lumbar fusion 12/24/2020   Acquired complex renal cyst 11/23/2020   Functional gait abnormality 11/04/2020   Idiopathic neuropathy 11/02/2020   Loop - Medtronic Linq 10/22/2020 10/22/2020   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   Recurrent falls 11/17/2019   Paroxysmal atrial fibrillation (HCC) 10/22/2019   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   Urinary urgency 04/13/2018   Other intervertebral disc degeneration, lumbar region 06/16/2017   Acquired bilateral hammer  toes 10/05/2015   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   Generalized anxiety disorder 05/12/2015   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   Essential hypertension 04/10/2014   Occlusion and stenosis of carotid artery without mention of cerebral infarction 12/15/2011   Mixed hyperlipidemia     PCP: Bernardino Cone  REFERRING PROVIDER: Bernardino Cone  REFERRING DIAG: weakness, multiple falls  Rationale for Evaluation and Treatment: Rehabilitation  THERAPY DIAG:  Other abnormalities of gait and mobility  Repeated falls  Other low back pain  Acute pain of right knee  Contracture of muscle, multiple sites  Muscle weakness (generalized)  ONSET DATE: 10/07/22  SUBJECTIVE:                                                                                                                                                                                           SUBJECTIVE STATEMENT: No falls over the past week, he does report increased dizziness  Patient reports that he has had 2 falls since the last visit, he reports both trying to stand and reach.  Reports sore in the right leg, hip and back   Patient asking about plan, he reports that he is worried that he will not be able to stay independent and live at home if he stops, he has no equipment and with the right knee's inability to extend and the left knee having issues with buckling due to neuropathy, he is worried about more falls and the strain on his wife   PERTINENT HISTORY:  Cerebral infarct 10/21/21, lumbar fusion, recurrent falls, has significant neuropathy and this is the reason for most of his falls   PAIN:  Are you having pain? Yes: NPRS scale: 0/10 Pain location: right knee Pain description: ache  Aggravating factors: being on my feet  Relieving factors: movement  PRECAUTIONS: Fall  WEIGHT BEARING RESTRICTIONS: No  FALLS:  Has patient fallen in last 6 months? Yes. Number of falls every few  days   LIVING ENVIRONMENT: Lives with: lives with  their spouse Lives in: House/apartment Stairs: Yes: Internal: 20 steps; on right going up Has following equipment at home: [name] - 4 wheeled, Wheelchair (manual), shower chair, Tour manager, Grab bars, Ramped entry, and trekking pole  OCCUPATION: Retired  PLOF: Independent, prior to a year ago he was driving used trekking poles to walk  PATIENT GOALS: live at home  NEXT MD VISIT:   OBJECTIVE:   DIAGNOSTIC FINDINGS:    LUMBAR SPINE FINDINGS: 1. Wide laminectomy and posterior pedicle screw and rod fixation at L4-5 without residual or recurrent stenosis. 2. Progressive adjacent level disease at L3-4 with moderate right and mild left subarticular narrowing and moderate foraminal stenosis bilaterally. Progression is predominantly due to facet disease 3. Progressive moderate facet hypertrophy at L1-2 with a progressive rightward disc protrusion resulting in progressive moderate right foraminal stenosis. Asymmetric endplate marrow edema on the right at L1-2 is consistent with progressive disease as well. 4. Otherwise stable degenerative change without other focal stenosis.   THORACIC SPINE FINDINGS:  On sagittal views the vertebral bodies have normal height and alignment.  Hemangioma within T9 vertebral body and slightly within T10 vertebral body.  Anterior kyphosis.  Mild scoliosis convex right centered at T8 level. The spinal cord is normal in size and appearance. The paraspinal soft tissues are unremarkable.     On axial views there is no spinal stenosis or foraminal narrowing.  Limited views of the aorta, kidneys, liver, lungs and paraspinal muscles are notable for multiple large right renal cysts ranging in size from 1.5 to 7 cm.   COGNITION: Overall cognitive status: Within functional limits for tasks assessed     SENSATION: WFL  MUSCLE LENGTH: Hamstrings: mod tightness on both sides  POSTURE: rounded shoulders and  forward head  LOWER EXTREMITY ROM:   the right knee has HS, has no quad tendon on the right, unable to lift the leg, left leg WFL's , left hip and right hip and ankles WFL's   LOWER EXTREMITY MMT: 4-/5 for the LE's except for the right knee extension UPPER EXTREMITY MMT:  4-/5 for shoulders and elbows   FUNCTIONAL TESTS:  5XSTS: 83 seconds really has to use his hands and is unsteady with standing Cannot stand on his own without holding onto something due to poor balance   Transfers:  needs set up and MinA for safety, is impulsive and unsafe at times. GAIT: He is w/c dependent    TODAY'S TREATMENT:                                                                                                                              DATE:  05/01/24 Standing in pbars with PT blocking knees marches and abduction 2.5# Seated 2.5# marches 45# HS curls 10# left leg ext 15# row 15# straight arm pulls 15# chest press 25# biceps 25# triceps Feet on ball K2C, rotation, bridges, isometric abs Ball b/n knees squeeze Blue tband clamshells VOR exercises given and  performed and educated to do at home   04/25/24 Feet on ball K2C, rotation, bridge, isometric abs We had a good discussion regarding his safety, we have talked about this in the past with his lack of patience.  And thinking he can do more as well as him possibly doing too much on the nustep at home as he has been doing 30 minutes and reports that he is worn out after this. Passive stretch LE's Ball b/n knees squeeze Blue tband clamshells Red tband ankle PF/DF 15# rows 15# extension 25# biceps 25# triceps 15# chest press  04/16/24 3# seated marches 2x10 3# in pbars PT blocking knees marching and hip abduction 2x15 45# HS curls 2x15 10# left leg extension 15# rows 15# straight arm extension 15# chest press 25# biceps 25# triceps Passive right knee extension Feet on ball K2C, rotation, bridge, isometric abs Blue tband  clamshells Ball b/n knees squeeze Blue tband right hip extension  04/09/24 Leg curls 45# 3x15 Leg ext 10# 3x15 2.5# seated marches 2.5# marches and hip abduction in the pbars with PT blocking knees 15# rows 15# straight arm pulls 15# chest press 25# biceps 25# triceps Feet on ball K2C, rotation, bridge, isometric abs Passive right knee extension, left hip piriformis Ball b/n knees squeeze Blue tband clamshells VOR eye ROM and saccades  04/01/24 2.5# standing in pbars hip flexion, abduction with PT blocking knee Seated 2.5# hip flexion 15# rows 15# straight arm ext 15# chest press 25# triceps 25# biceps 45# leg curls 10# leg ext Feet on ball K2C, rotation, bridge, isometric abs Black tband supine hip extension to focus on the right leg as it was not working doing bridges on the ball Supine hip IR as it ER's significantly   03/25/24 In pbars 2.5# marches and hip abd with PT blocking knees Seated 2.5 # marches Leg curls 45# 3x15 Leg ext 10# 3x15 Leg press 30# 15# straight arm pulls 15# rows 15# chest press 25# triceps overhead 25# biceps Feet on ball K2C, rotation, bridge, isometric abs   03/18/24 2.5# standing in Pbars PT blocking knees marches and abduction 25# triceps 25# biceps 15# rows 15# chest press 15# straight arm pulls 45# HS curls, 2x15, then right only 25# 2x10 10# leg ext Leg press 40# 15# AR press Feet on ball K2C, rotation, bridge, isometric abs Black tband clamshells  03/11/24 Standing pbars 2.5# marches and hip abduction with PT blocking knees 25# biceps 15# straight arm pulls 15# rows 15# chest press 25# triceps 45# HS curls 10#  left leg extension 40# leg press  all above 2 x15 reps Feet on ball K2C, rotation, bridges, isometric abs Passive right knee extension Black tband clamshells  03/05/24 15# Row 15# straight arm pulls 2x15 15# chest press 25# triceps 25# biceps In pbars 2.5# marches and abduction in standing with PT  blocking knees 45# HS curls 10# leg extension Feet on ball K2C, rotation, bridge, isometric abs Seated elbow touches  02/26/24 In pbars 2.5# marches and abduction PT blocking knees 2.5# seated marches 15# rows 15# straight arm pulls 15# chest press 25# triceps 25# biceps Transfer with Walker from w/c to mat table CGA 30 seconds Feet on ball K2C, rotation, bridges. Isometric abs Passive right knee stretch into extension Ball b/n knees squeeze Blue tband clamshells Gait with CGA using FWW and close WC follow x 30 feet all weight are on hands  02/20/24 In pbars 2.5# marches and hip abduction with PT blocking knees 45#  HS curls, RLE 20lb 2x15 10# leg extension 15# rows 15# extension 15# chest press 10lb chest flys  25# biceps. 25# triceps Passive stretch left HS and piriformis  PATIENT EDUCATION:  Education details: POC Person educated: Patient Education method: Explanation Education comprehension: verbalized understanding  HOME EXERCISE PROGRAM:  ASSESSMENT:  CLINICAL IMPRESSION: Patient reports that he is feeling weaker.  We discussed backing down from the Nustep as he reports doing > 30 minutes and his legs being worn out, we discussed skipping days to allow the legs to recover.  This again goes to the issue of him being unsafe and PT required for all transfers in a setting of a gym or to do exercises.  Patient continues to live with wife at home, he is doing nustep at home, but has no ability to do other exercises that are strong enough to benefit him, other issues include unsafe transfers, legs buckling and giving, neuropathy and a torn right quad tendon.  He can transfer with his wife but again is very unsafe and would not be able to go to gym due to safety issues, in clinic he needs setup and CGA, sometimes needs PT to block knee so it does not give out   He does have a posterior walker at home that he is trying to use some.  I continue to work with Brandon Mendoza regarding  his current level and his ability to stay home and live independently with his wife.  His diagnosis is complicated in a way that requires continued skilled interventions to maintain his current level, he is unsafe with transfers and would not be able to do any significant exercise without skilled interventions as he has right knee that has a ruptured quad tendon causing him to be unable to bear weight on this side and has significant neuropathy in both legs , again this causes transfers to be unsafe and unreliable as his knees will give at any time, he has had some falls at home and he feels that due to the PT interventions that he was able to get up on his own, not hurting his wife or having to have an ambulance come to the house and get him up.    WE continue to work on his overall health and hopefully his safety with transfers.    HE is doing the Nustep at home he is still having some hip pain, he does report not taking my advice fully about taking days off.  He still is unsafe at time with the transfers especially when he is fatigued, he is still impulsive and goes too fast at times. The right leg has no quad tendon and the left leg has neuropathy so he is at a very high risk for falls and for regression as he could not exercise much on his own and his wife cannot help him with the standing I feel CGA and blocking the knees are needed due to at any time the knees can buckle, we have tried to teach him to do hip extension to keep the knee locked but he does not do this.  We have set up the maintenance program following medicare guidelines.  He qualifies for this due to the inability to be safe, has had multiple falls and is impulsive, tends to think he can do more than he can.  Patient has not had a fall in the past 5 weeks, he remains very unsafe at times and with any transfer needs min A or close  CGA.  Tends to not understand that the right knee has no quad to hold the leg straight.  I  In the long run he  is impulsive and unsafe and cannot exercise on his own to maintain his level, he is at a high risk for falls and there is no gym that has the ability to assist him. Session completed at mat table to eliminate trunk support for improved core stabilization. Core weakness with seated OHP.  Pt did report a fall yesterday stated he was standing pulling his pants up, I advised pt that he shouldn't put weight in his RLE, He ws adamant that he could put a little on it.     Justifying Skilled Maintenance for Rehab Services:   The goal of a skilled maintenance therapy program is to maintain the patient's current functional status or to prevent or slow deterioration. If a patient who is receiving restorative therapy then requires skilled maintenance therapy based on the skills and judgment of the therapist, development of a maintenance program would occur during the last visit for restorative treatment.    Does the patient need treatment that necessitates skilled services that can only be provided by a skilled provider? Can a caregiver provide these services why or why not? 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  He is impulsive and at times very unsafe he could not do this on his own and his wife could not help him., history of COPD, CKD, depression, HTN, kyphosis, a-fib, PVD, spinal stenosis, and stroke as well as RC repair.  These limit the patient's independent carryover with PT  exercises and require max cueing for optimal performance of safety and to strengthen other mms for his future in therapy sessions.    The patient requires the skill of PT to help with ongoing exercise and mobility to prevent rapid and significant decline in functional mobility, strength, and to limit falls with his safety.  Therefore, continued  skilled maintenance therapy is recommended for this patient.    OBJECTIVE IMPAIRMENTS: Abnormal gait, cardiopulmonary status limiting activity, decreased activity tolerance, decreased balance, decreased coordination, decreased endurance, decreased mobility, difficulty walking, decreased ROM, decreased strength, improper body mechanics, postural dysfunction, and poor safety.   REHAB POTENTIAL: Fair dependent on carry over and patient compliance  CLINICAL DECISION MAKING: Evolving/moderate complexity  EVALUATION COMPLEXITY: Moderate  GOALS: Goals for skilled maintenance:     Long Term Goals: Target Date 12/13/23     The patient will preserve functional mobility within the home environment. Baseline: uses a w/c            Goal Status: met 01/15/24   2. The patient will maintain strength for transfers.            Baseline: able to perform with set up and at times requires mod A and at other time may need Max A due to poor safety and impulsiveness            Goal Status: progressing 02/26/24   3. The patient will minimize fall frequency.            Baseline: he has been having about a fall a month            Goal Status: progressing 02/26/24  4. Safe with HEP at home and with equipment he has            Baseline: does HEP             Goal Status: progressing 02/26/24   PLAN:  PT FREQUENCY: 1x/week  PT DURATION: 12 weeks  PLANNED INTERVENTIONS: Therapeutic exercises, Therapeutic activity, Neuromuscular re-education, Balance training, Gait training, Patient/Family education, Self Care, Joint mobilization, Stair training, Cryotherapy, Moist heat, and Manual therapy.  PLAN FOR NEXT SESSION: Have sat up the maintenance program for Brandon. Mendoza and progressing with this, biggest issue he is unsafe and cannot do on his own or with his wife.  He reports that he is starting to look at possibly moving to an assisted living Next visit will review and renew for maintenance  OBADIAH OZELL ORN, PT  "

## 2024-05-07 ENCOUNTER — Ambulatory Visit: Admitting: Physical Therapy

## 2024-05-13 ENCOUNTER — Ambulatory Visit: Admitting: Physical Therapy

## 2024-05-13 ENCOUNTER — Encounter

## 2024-05-14 ENCOUNTER — Ambulatory Visit: Admitting: Physical Therapy

## 2024-05-14 ENCOUNTER — Ambulatory Visit

## 2024-05-17 ENCOUNTER — Ambulatory Visit

## 2024-05-20 ENCOUNTER — Ambulatory Visit: Admitting: Physical Therapy

## 2024-05-27 ENCOUNTER — Ambulatory Visit: Admitting: Physical Therapy

## 2024-06-13 ENCOUNTER — Encounter

## 2024-06-14 ENCOUNTER — Ambulatory Visit

## 2024-06-17 ENCOUNTER — Ambulatory Visit

## 2024-07-10 ENCOUNTER — Ambulatory Visit

## 2024-07-12 ENCOUNTER — Ambulatory Visit: Admitting: Family Medicine

## 2024-07-18 ENCOUNTER — Ambulatory Visit

## 2024-08-18 ENCOUNTER — Ambulatory Visit

## 2024-09-18 ENCOUNTER — Ambulatory Visit

## 2024-10-19 ENCOUNTER — Ambulatory Visit

## 2024-11-19 ENCOUNTER — Ambulatory Visit

## 2024-11-25 ENCOUNTER — Telehealth: Admitting: Family Medicine

## 2024-12-20 ENCOUNTER — Ambulatory Visit

## 2025-01-20 ENCOUNTER — Ambulatory Visit

## 2025-02-20 ENCOUNTER — Ambulatory Visit
